# Patient Record
Sex: Female | Born: 1950 | Race: Black or African American | Hispanic: No | Marital: Married | Smoking: Never smoker
Health system: Southern US, Community
[De-identification: ages and names within clinical notes are randomized; demographics above are authoritative.]

## PROBLEM LIST (undated history)

## (undated) DIAGNOSIS — I5042 Chronic combined systolic (congestive) and diastolic (congestive) heart failure: Secondary | ICD-10-CM

## (undated) DIAGNOSIS — I10 Essential (primary) hypertension: Secondary | ICD-10-CM

## (undated) DIAGNOSIS — R7989 Other specified abnormal findings of blood chemistry: Secondary | ICD-10-CM

## (undated) DIAGNOSIS — E119 Type 2 diabetes mellitus without complications: Secondary | ICD-10-CM

## (undated) DIAGNOSIS — R778 Other specified abnormalities of plasma proteins: Secondary | ICD-10-CM

## (undated) DIAGNOSIS — L97909 Non-pressure chronic ulcer of unspecified part of unspecified lower leg with unspecified severity: Secondary | ICD-10-CM

## (undated) DIAGNOSIS — N186 End stage renal disease: Secondary | ICD-10-CM

## (undated) DIAGNOSIS — K589 Irritable bowel syndrome without diarrhea: Secondary | ICD-10-CM

## (undated) DIAGNOSIS — D649 Anemia, unspecified: Secondary | ICD-10-CM

## (undated) HISTORY — PX: HIP CLOSED REDUCTION: SHX983

## (undated) HISTORY — PX: CHOLECYSTECTOMY: SHX55

## (undated) HISTORY — DX: Other specified abnormalities of plasma proteins: R77.8

## (undated) HISTORY — DX: End stage renal disease: N18.6

## (undated) HISTORY — PX: CATARACT EXTRACTION, BILATERAL: SHX1313

## (undated) HISTORY — DX: Chronic combined systolic (congestive) and diastolic (congestive) heart failure: I50.42

## (undated) HISTORY — DX: Other specified abnormal findings of blood chemistry: R79.89

## (undated) HISTORY — DX: Anemia, unspecified: D64.9

---

## 1998-05-07 ENCOUNTER — Ambulatory Visit (HOSPITAL_COMMUNITY): Admission: RE | Admit: 1998-05-07 | Discharge: 1998-05-07 | Payer: Self-pay

## 1998-06-25 ENCOUNTER — Emergency Department (HOSPITAL_COMMUNITY): Admission: EM | Admit: 1998-06-25 | Discharge: 1998-06-25 | Payer: Self-pay | Admitting: Emergency Medicine

## 2012-09-17 ENCOUNTER — Other Ambulatory Visit (HOSPITAL_COMMUNITY): Payer: Self-pay | Admitting: Family Medicine

## 2012-09-17 DIAGNOSIS — Z1231 Encounter for screening mammogram for malignant neoplasm of breast: Secondary | ICD-10-CM

## 2012-09-26 ENCOUNTER — Ambulatory Visit (HOSPITAL_COMMUNITY)
Admission: RE | Admit: 2012-09-26 | Discharge: 2012-09-26 | Disposition: A | Discharge status: Home / Self Care | Payer: Self-pay | Source: Ambulatory Visit | Attending: Family Medicine | Admitting: Family Medicine

## 2012-09-26 DIAGNOSIS — Z1231 Encounter for screening mammogram for malignant neoplasm of breast: Secondary | ICD-10-CM | POA: Insufficient documentation

## 2012-09-30 ENCOUNTER — Other Ambulatory Visit: Payer: Self-pay | Admitting: Family Medicine

## 2012-09-30 DIAGNOSIS — R928 Other abnormal and inconclusive findings on diagnostic imaging of breast: Secondary | ICD-10-CM

## 2012-10-08 ENCOUNTER — Telehealth (HOSPITAL_COMMUNITY): Payer: Self-pay | Admitting: *Deleted

## 2012-10-08 NOTE — Telephone Encounter (Signed)
Telephoned patient at home # and left message to return call to [facility] 

## 2012-10-10 ENCOUNTER — Other Ambulatory Visit: Payer: Self-pay | Admitting: *Deleted

## 2012-10-15 ENCOUNTER — Ambulatory Visit (HOSPITAL_COMMUNITY): Payer: Self-pay

## 2013-02-26 ENCOUNTER — Ambulatory Visit
Admission: RE | Admit: 2013-02-26 | Discharge: 2013-02-26 | Disposition: A | Discharge status: Home / Self Care | Payer: PRIVATE HEALTH INSURANCE | Source: Ambulatory Visit | Attending: Family Medicine | Admitting: Family Medicine

## 2013-02-26 DIAGNOSIS — R928 Other abnormal and inconclusive findings on diagnostic imaging of breast: Secondary | ICD-10-CM

## 2014-07-31 ENCOUNTER — Other Ambulatory Visit: Payer: Self-pay | Admitting: Internal Medicine

## 2014-07-31 DIAGNOSIS — R1012 Left upper quadrant pain: Secondary | ICD-10-CM

## 2014-08-06 ENCOUNTER — Ambulatory Visit
Admission: RE | Admit: 2014-08-06 | Discharge: 2014-08-06 | Disposition: A | Discharge status: Home / Self Care | Payer: Medicare Other | Source: Ambulatory Visit | Attending: Internal Medicine | Admitting: Internal Medicine

## 2014-08-06 ENCOUNTER — Encounter (INDEPENDENT_AMBULATORY_CARE_PROVIDER_SITE_OTHER): Payer: Self-pay

## 2014-08-06 ENCOUNTER — Other Ambulatory Visit: Payer: Self-pay | Admitting: Internal Medicine

## 2014-08-06 DIAGNOSIS — R1012 Left upper quadrant pain: Secondary | ICD-10-CM

## 2014-08-06 DIAGNOSIS — K829 Disease of gallbladder, unspecified: Secondary | ICD-10-CM

## 2014-08-13 ENCOUNTER — Other Ambulatory Visit: Payer: Medicare Other

## 2014-08-18 ENCOUNTER — Ambulatory Visit
Admission: RE | Admit: 2014-08-18 | Discharge: 2014-08-18 | Disposition: A | Discharge status: Home / Self Care | Payer: Medicare Other | Source: Ambulatory Visit | Attending: Internal Medicine | Admitting: Internal Medicine

## 2015-06-21 ENCOUNTER — Emergency Department (HOSPITAL_COMMUNITY)
Admission: EM | Admit: 2015-06-21 | Discharge: 2015-06-21 | Disposition: A | Discharge status: Home / Self Care | Payer: Medicare Other | Attending: Emergency Medicine | Admitting: Emergency Medicine

## 2015-06-21 ENCOUNTER — Emergency Department (HOSPITAL_COMMUNITY): Payer: Medicare Other

## 2015-06-21 ENCOUNTER — Encounter (HOSPITAL_COMMUNITY): Payer: Self-pay

## 2015-06-21 DIAGNOSIS — K8020 Calculus of gallbladder without cholecystitis without obstruction: Secondary | ICD-10-CM | POA: Diagnosis not present

## 2015-06-21 DIAGNOSIS — R630 Anorexia: Secondary | ICD-10-CM | POA: Diagnosis not present

## 2015-06-21 DIAGNOSIS — R197 Diarrhea, unspecified: Secondary | ICD-10-CM | POA: Diagnosis not present

## 2015-06-21 DIAGNOSIS — Z794 Long term (current) use of insulin: Secondary | ICD-10-CM | POA: Diagnosis not present

## 2015-06-21 DIAGNOSIS — R1011 Right upper quadrant pain: Secondary | ICD-10-CM | POA: Diagnosis present

## 2015-06-21 DIAGNOSIS — R109 Unspecified abdominal pain: Secondary | ICD-10-CM

## 2015-06-21 DIAGNOSIS — E119 Type 2 diabetes mellitus without complications: Secondary | ICD-10-CM | POA: Insufficient documentation

## 2015-06-21 DIAGNOSIS — I10 Essential (primary) hypertension: Secondary | ICD-10-CM | POA: Diagnosis not present

## 2015-06-21 DIAGNOSIS — Z79899 Other long term (current) drug therapy: Secondary | ICD-10-CM | POA: Diagnosis not present

## 2015-06-21 HISTORY — DX: Type 2 diabetes mellitus without complications: E11.9

## 2015-06-21 HISTORY — DX: Essential (primary) hypertension: I10

## 2015-06-21 LAB — COMPREHENSIVE METABOLIC PANEL
ALK PHOS: 93 U/L (ref 38–126)
ALT: 19 U/L (ref 14–54)
AST: 36 U/L (ref 15–41)
Albumin: 4.1 g/dL (ref 3.5–5.0)
Anion gap: 17 — ABNORMAL HIGH (ref 5–15)
BILIRUBIN TOTAL: 0.9 mg/dL (ref 0.3–1.2)
BUN: 21 mg/dL — AB (ref 6–20)
CALCIUM: 10.2 mg/dL (ref 8.9–10.3)
CO2: 27 mmol/L (ref 22–32)
CREATININE: 1.26 mg/dL — AB (ref 0.44–1.00)
Chloride: 95 mmol/L — ABNORMAL LOW (ref 101–111)
GFR calc Af Amer: 52 mL/min — ABNORMAL LOW (ref >60)
GFR calc non Af Amer: 45 mL/min — ABNORMAL LOW (ref >60)
GLUCOSE: 392 mg/dL — AB (ref 65–99)
POTASSIUM: 3.7 mmol/L (ref 3.5–5.1)
Sodium: 139 mmol/L (ref 135–145)
TOTAL PROTEIN: 8.9 g/dL — AB (ref 6.5–8.1)

## 2015-06-21 LAB — LIPASE, BLOOD: LIPASE: 25 U/L (ref 11–51)

## 2015-06-21 LAB — CBC
HEMATOCRIT: 43.6 % (ref 36.0–46.0)
Hemoglobin: 14.4 g/dL (ref 12.0–15.0)
MCH: 26.9 pg (ref 26.0–34.0)
MCHC: 33 g/dL (ref 30.0–36.0)
MCV: 81.5 fL (ref 78.0–100.0)
Platelets: 309 10*3/uL (ref 150–400)
RBC: 5.35 MIL/uL — ABNORMAL HIGH (ref 3.87–5.11)
RDW: 13.5 % (ref 11.5–15.5)
WBC: 14.3 10*3/uL — ABNORMAL HIGH (ref 4.0–10.5)

## 2015-06-21 MED ORDER — ONDANSETRON HCL 4 MG PO TABS: 4.0000 mg | ORAL_TABLET | Freq: Three times a day (TID) | ORAL | Status: DC | PRN

## 2015-06-21 MED ORDER — FENTANYL CITRATE (PF) 100 MCG/2ML IJ SOLN
50.0000 ug | Freq: Once | INTRAMUSCULAR | Status: AC
  Administered 2015-06-21: 50 ug via INTRAVENOUS
  Filled 2015-06-21: qty 2

## 2015-06-21 MED ORDER — SODIUM CHLORIDE 0.9 % IV SOLN
Freq: Once | INTRAVENOUS | Status: AC
  Administered 2015-06-21: 75 mL/h via INTRAVENOUS

## 2015-06-21 MED ORDER — IOHEXOL 300 MG/ML  SOLN
80.0000 mL | Freq: Once | INTRAMUSCULAR | Status: AC | PRN
  Administered 2015-06-21: 80 mL via INTRAVENOUS

## 2015-06-21 MED ORDER — ONDANSETRON HCL 4 MG/2ML IJ SOLN
4.0000 mg | Freq: Once | INTRAMUSCULAR | Status: AC
  Administered 2015-06-21: 4 mg via INTRAVENOUS
  Filled 2015-06-21: qty 2

## 2015-06-21 MED ORDER — HYDROCHLOROTHIAZIDE 12.5 MG PO CAPS
12.5000 mg | ORAL_CAPSULE | Freq: Once | ORAL | Status: AC
  Administered 2015-06-21: 12.5 mg via ORAL
  Filled 2015-06-21: qty 1

## 2015-06-21 MED ORDER — FENTANYL CITRATE (PF) 100 MCG/2ML IJ SOLN
100.0000 ug | Freq: Once | INTRAMUSCULAR | Status: AC
  Administered 2015-06-21: 100 ug via INTRAVENOUS
  Filled 2015-06-21: qty 2

## 2015-06-21 MED ORDER — LABETALOL HCL 5 MG/ML IV SOLN
10.0000 mg | Freq: Once | INTRAVENOUS | Status: DC
  Filled 2015-06-21: qty 4

## 2015-06-21 MED ORDER — LOSARTAN POTASSIUM 50 MG PO TABS
100.0000 mg | ORAL_TABLET | Freq: Once | ORAL | Status: AC
  Administered 2015-06-21: 100 mg via ORAL
  Filled 2015-06-21: qty 2

## 2015-06-21 MED ORDER — ONDANSETRON HCL 4 MG/2ML IJ SOLN
2.0000 mg | Freq: Once | INTRAMUSCULAR | Status: AC
  Administered 2015-06-21: 2 mg via INTRAVENOUS
  Filled 2015-06-21: qty 2

## 2015-06-21 NOTE — ED Notes (Signed)
EKG was performed in triage by Philippa Chester, Phlebotomy at 1012 and signed by Dayna Barker, MD at [phone]

## 2015-06-21 NOTE — ED Notes (Signed)
Patient here with vomiting and abdominal pain since last pm, denies diarrhea. Complains that her pain is a cramping pain

## 2015-06-21 NOTE — ED Notes (Signed)
Doctor notified of patient's blood pressure. Patient did not take blood pressure medication for two days due abdominal pain with nausea and emesis.  Doctor will assess response to blood pressure after pain medication administration.

## 2015-06-21 NOTE — ED Notes (Signed)
Ordered not to give labetalol to patient by Doctor.

## 2015-06-21 NOTE — ED Provider Notes (Signed)
CSN: [id number]     Arrival date & time 06/21/15  0949 History   First MD Initiated Contact with Patient 06/21/15 1022     Chief Complaint  Patient presents with  . Abdominal Pain  . Emesis     (Consider location/radiation/quality/duration/timing/severity/associated sxs/prior Treatment) HPI  Molly [name] is a 64 year old woman with PMH of T2DM (Levemir 32U nightly, glymepriride) and HTN (losartan) who presents with acute onset abdominal pain. This came on after a meal last night at [employer], comprised of onion rings and a milk shake. She describes it as a cramping pain in the right upper and upper abdomen. It is associated with several episodes of non-bloody, non-bilious emesis, consisting of partially digested food. She's had some non-bloody, non-melenic loose stools. She indicates she has never felt symptoms like this before. She says that a year or so ago she episodes of indigestion and vomiting, which is treated with metoclopramide. She has never had a gastric emptying study. She denies fever or chills  Past Medical History  Diagnosis Date  . Hypertension   . Diabetes mellitus without complication (HCC)    History reviewed. No pertinent past surgical history. No family history on file. Social History  Substance Use Topics  . Smoking status: Never Smoker   . Smokeless tobacco: None  . Alcohol Use: None   OB History    No data available     Review of Systems  Constitutional: Positive for appetite change. Negative for fever and chills.  HENT: Negative for congestion and sore throat.   Eyes: Negative for photophobia and visual disturbance.  Respiratory: Negative for cough and shortness of breath.   Cardiovascular: Negative for chest pain and leg swelling.  Gastrointestinal: Positive for nausea, vomiting, abdominal pain and diarrhea. Negative for constipation and blood in stool.  Genitourinary: Negative for dysuria and pelvic pain.  Musculoskeletal: Negative for back pain and  arthralgias.  Skin: Negative for pallor and rash.  Neurological: Negative for dizziness and headaches.  Psychiatric/Behavioral: Negative for dysphoric mood. The patient is not nervous/anxious.   All other systems reviewed and are negative.     Allergies  Mercury  Home Medications   Prior to Admission medications   Medication Sig Start Date End Date Taking? Authorizing Provider  glimepiride (AMARYL) 4 MG tablet Take 4 mg by mouth daily with breakfast.   Yes Historical Provider, MD  Insulin Detemir (LEVEMIR FLEXPEN) 100 UNIT/ML Pen Inject 32 Units into the skin daily at 10 pm.   Yes Historical Provider, MD  Lactobacillus (PROBIOTIC ACIDOPHILUS PO) Take by mouth daily.   Yes Historical Provider, MD  losartan-hydrochlorothiazide (HYZAAR) 100-12.5 MG tablet Take 1 tablet by mouth daily.   Yes Historical Provider, MD  metoCLOPramide (REGLAN) 5 MG tablet Take 5 mg by mouth 2 (two) times daily.   Yes Historical Provider, MD  ondansetron (ZOFRAN) 4 MG tablet Take 1 tablet (4 mg total) by mouth every 8 (eight) hours as needed for nausea or vomiting. 06/21/15   Jeremy Ford, MD   BP 238/116 mmHg  Pulse 93  Temp(Src) 97.7 F (36.5 C) (Oral)  Resp 20  Ht 5' 2" (1.575 m)  Wt 70.308 kg  BMI 28.34 kg/m2  SpO2 98% Physical Exam  Constitutional: She appears well-developed and well-nourished.  Appearing uncomfortable.  HENT:  Head: Normocephalic and atraumatic.  Mouth/Throat: No oropharyngeal exudate.  Dry mucous membranes.  Eyes: EOM are normal. Pupils are equal, round, and reactive to light. No scleral icterus.  Neck: Normal range   of motion. Neck supple.  Cardiovascular: Normal rate, regular rhythm and normal heart sounds.   Pulmonary/Chest: Effort normal and breath sounds normal. No respiratory distress. She has no wheezes.  Abdominal:  Hypoactive bowel sounds. Exquisite tenderness resting stethoscope in epigastric region. + Murphy's sign. Cannot tolerate deep palpation. No palpable masses.  No rebound.   Musculoskeletal: She exhibits no edema or tenderness.  Lymphadenopathy:    She has no cervical adenopathy.  Skin: She is not diaphoretic.  Nursing note and vitals reviewed.   ED Course  Procedures (including critical care time) Labs Review Labs Reviewed  COMPREHENSIVE METABOLIC PANEL - Abnormal; Notable for the following:    Chloride 95 (*)    Glucose, Bld 392 (*)    BUN 21 (*)    Creatinine, Ser 1.26 (*)    Total Protein 8.9 (*)    GFR calc non Af Amer 45 (*)    GFR calc Af Amer 52 (*)    Anion gap 17 (*)    All other components within normal limits  CBC - Abnormal; Notable for the following:    WBC 14.3 (*)    RBC 5.35 (*)    All other components within normal limits  LIPASE, BLOOD  URINALYSIS, ROUTINE W REFLEX MICROSCOPIC (NOT AT [facility])   CMP Latest Ref Rng 06/21/2015  Glucose 65 - 99 mg/dL 392(H)  BUN 6 - 20 mg/dL 21(H)  Creatinine 0.44 - 1.00 mg/dL 1.26(H)  Sodium 135 - 145 mmol/L 139  Potassium 3.5 - 5.1 mmol/L 3.7  Chloride 101 - 111 mmol/L 95(L)  CO2 22 - 32 mmol/L 27  Calcium 8.9 - 10.3 mg/dL 10.2  Total Protein 6.5 - 8.1 g/dL 8.9(H)  Total Bilirubin 0.3 - 1.2 mg/dL 0.9  Alkaline Phos 38 - 126 U/L 93  AST 15 - 41 U/L 36  ALT 14 - 54 U/L 19    CBC Latest Ref Rng 06/21/2015  WBC 4.0 - 10.5 K/uL 14.3(H)  Hemoglobin 12.0 - 15.0 g/dL 14.4  Hematocrit 36.0 - 46.0 % 43.6  Platelets 150 - 400 K/uL 309      Lipase     Component Value Date/Time   LIPASE 25 06/21/2015 1020   Filed Vitals:   06/21/15 1330 06/21/15 1345 06/21/15 1400 06/21/15 1504  BP: 244/113 246/115 261/109 238/116  Pulse: 80 84 81 93  Temp:      TempSrc:      Resp: _0 Height:      Weight:      SpO2: 97% 94% 96% 98%   Imaging Review Ct Abdomen Pelvis W Contrast  06/21/2015  CLINICAL DATA:  65 year old female with epigastric pain common nausea and vomiting since yesterday evening. Loose stools. EXAM: CT ABDOMEN AND PELVIS WITH CONTRAST TECHNIQUE: Multidetector CT  imaging of the abdomen and pelvis was performed using the standard protocol following bolus administration of intravenous contrast. CONTRAST:  63m OMNIPAQUE IOHEXOL 300 MG/ML  SOLN COMPARISON:  No priors. FINDINGS: Lower chest: Mild scarring in the lung bases bilaterally. Atherosclerotic calcifications in the left circumflex and right coronary arteries. Mild cardiomegaly. Hepatobiliary: No cystic or solid hepatic lesions. No intra or extrahepatic biliary ductal dilatation. Gallbladder is normal in appearance. Pancreas: No pancreatic mass. No pancreatic ductal dilatation. No pancreatic or peripancreatic fluid or inflammatory changes. Spleen: Unremarkable. Adrenals/Urinary Tract: Bilateral adrenal glands and left kidney are normal in appearance. Sub cm low-attenuation lesion in the upper pole of the right kidney is too small to definitively characterize, but is  likely a cyst. No hydroureteronephrosis. Urinary bladder is normal in appearance. Bilateral adrenal glands are normal in appearance. Stomach/Bowel: Normal appearance of the stomach. No pathologic dilatation of small bowel or colon. Colon is essentially completely decompressed. Normal appendix. Vascular/Lymphatic: Extensive atherosclerosis throughout the abdominal and pelvic vasculature, including an area of moderate to severe stenosis immediately beyond the right renal artery origin and before the left renal artery origin (best demonstrated on images 32 and 33 of series 201). Celiac axis, superior mesenteric artery and inferior mesenteric artery appear patent at this time. No lymphadenopathy noted in the abdomen or pelvis. Reproductive: Uterus and ovaries are unremarkable in appearance. Other: No significant volume of ascites.  No pneumoperitoneum. Musculoskeletal: There are no aggressive appearing lytic or blastic lesions noted in the visualized portions of the skeleton. IMPRESSION: 1. No acute findings in the abdomen or pelvis to account for the patient's  symptoms. 2. 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 3. Normal appendix. 4. Additional incidental findings, as above. Electronically Signed   By: Daniel  Entrikin M.D.   On: 06/21/2015 15:10   Us Abdomen Limited Ruq  06/21/2015  CLINICAL DATA:  Patient with right upper quadrant abdominal pain since last night. EXAM: US ABDOMEN LIMITED - RIGHT UPPER QUADRANT COMPARISON:  Abdominal ultrasound 08/18/2014 FINDINGS: Gallbladder: Multiple mobile stones are demonstrated within the gallbladder lumen. No gallbladder wall thickening. No sonographic Murphy's sign. No pericholecystic fluid. Common bile duct: Diameter: 5 mm Liver: No focal lesion identified. Within normal limits in parenchymal echogenicity. IMPRESSION: Cholelithiasis without sonographic evidence for acute cholecystitis. Electronically Signed   By: Drew  Davis M.D.   On: 06/21/2015 12:25   I have personally reviewed and evaluated these images and lab results as part of my medical decision-making.   EKG Interpretation   Date/Time:  Monday June 21 2015 10:12:32 EST Ventricular Rate:  83 PR Interval:  184 QRS Duration: 86 QT Interval:  416 QTC Calculation: 488 R Axis:   13 Text Interpretation:  Normal sinus rhythm Anterior infarct , age  undetermined Abnormal ECG No previous ECGs available Confirmed by MESNER  MD, JASON ([id number]) on 06/21/2015 10:21:04 AM      MDM   Final diagnoses:  Abdominal pain  Calculus of gallbladder without cholecystitis without obstruction   Patient presents with exquisite RUQ & epigastric abdominal pain with BP to 257/106, WBC 14.7. No fever. Patient has known cholelithiasis from 08/2014 US. Will obtain lipase, RUQ US, and CMET. Has not taken losartan in a couple days, although much of HTN could be driven by pain. Will give IV fentanyl and see to what degree BP  reponds.  Normal lipase, alk phos, LFTs normal. RUQ US demonstrates cholelithiasis, similar to prior, without cholecystitis. CT abdomen showed no acute findings correlated with current symptoms. Patient had elevated blood pressure, likely due to skipping blood pressure meds, and came down by 20 points by re-initiating her home meds. She was advised to reinitiate these at home as soon as possible and follow up with her PCP. She denied any vision changes or headache. She denied any chest pain, shortness of breath. Her Cr was elevated to 1.24, likely due to poor po intake.   She was able to tolerate apple sauce. Instructions were given for follow-up. She was told she could pursue elective cholecystectomy on an outpatient basis. She was discharged in good condition.        Jeremy Ford, MD 06/21/15 1640  Jason Mesner, MD 06/23/15 1735 

## 2015-06-21 NOTE — Discharge Instructions (Signed)
Molly [name], it was a pleasure taking care of you in the hospital.  You have stones in your gallbladder, but there is no sign of a gallbladder infection. There is no sign of inflammation in your pancreas. You do not need an emergency surgery.  We will prescribe anti-nausea pills. If you should develop a fever your symptoms are not improved in a day or two, please seek medical attention.   Please follow up with your primary doctor as soon as possible. Something to consider would be an elective surgery for your gallbladder.   When you are able to, please restart your blood pressure medication, as your blood pressure was high in the emergency department.

## 2015-06-21 NOTE — ED Notes (Signed)
Doctor notified of blood pressure and at bedside.

## 2015-06-21 NOTE — ED Notes (Signed)
Doctor at bedside.

## 2015-06-29 ENCOUNTER — Encounter (HOSPITAL_COMMUNITY): Payer: Self-pay

## 2015-06-29 ENCOUNTER — Inpatient Hospital Stay (HOSPITAL_COMMUNITY)
Admission: EM | Admit: 2015-06-29 | Discharge: 2015-07-06 | DRG: 417 | Disposition: A | Discharge status: Home Health | Payer: Medicare Other | Attending: Family Medicine | Admitting: Family Medicine

## 2015-06-29 DIAGNOSIS — E11649 Type 2 diabetes mellitus with hypoglycemia without coma: Secondary | ICD-10-CM | POA: Diagnosis not present

## 2015-06-29 DIAGNOSIS — Z91128 Patient's intentional underdosing of medication regimen for other reason: Secondary | ICD-10-CM | POA: Diagnosis not present

## 2015-06-29 DIAGNOSIS — E876 Hypokalemia: Secondary | ICD-10-CM | POA: Diagnosis not present

## 2015-06-29 DIAGNOSIS — Z419 Encounter for procedure for purposes other than remedying health state, unspecified: Secondary | ICD-10-CM

## 2015-06-29 DIAGNOSIS — Z794 Long term (current) use of insulin: Secondary | ICD-10-CM | POA: Diagnosis not present

## 2015-06-29 DIAGNOSIS — Z91048 Other nonmedicinal substance allergy status: Secondary | ICD-10-CM | POA: Diagnosis not present

## 2015-06-29 DIAGNOSIS — I700 Atherosclerosis of aorta: Secondary | ICD-10-CM

## 2015-06-29 DIAGNOSIS — K8020 Calculus of gallbladder without cholecystitis without obstruction: Secondary | ICD-10-CM

## 2015-06-29 DIAGNOSIS — N179 Acute kidney failure, unspecified: Secondary | ICD-10-CM | POA: Diagnosis not present

## 2015-06-29 DIAGNOSIS — D72829 Elevated white blood cell count, unspecified: Secondary | ICD-10-CM

## 2015-06-29 DIAGNOSIS — E162 Hypoglycemia, unspecified: Secondary | ICD-10-CM | POA: Diagnosis not present

## 2015-06-29 DIAGNOSIS — Z79899 Other long term (current) drug therapy: Secondary | ICD-10-CM

## 2015-06-29 DIAGNOSIS — K819 Cholecystitis, unspecified: Secondary | ICD-10-CM

## 2015-06-29 DIAGNOSIS — R739 Hyperglycemia, unspecified: Secondary | ICD-10-CM

## 2015-06-29 DIAGNOSIS — E1310 Other specified diabetes mellitus with ketoacidosis without coma: Secondary | ICD-10-CM | POA: Diagnosis not present

## 2015-06-29 DIAGNOSIS — D62 Acute posthemorrhagic anemia: Secondary | ICD-10-CM | POA: Diagnosis not present

## 2015-06-29 DIAGNOSIS — E1311 Other specified diabetes mellitus with ketoacidosis with coma: Secondary | ICD-10-CM | POA: Diagnosis not present

## 2015-06-29 DIAGNOSIS — E860 Dehydration: Secondary | ICD-10-CM | POA: Diagnosis present

## 2015-06-29 DIAGNOSIS — K8000 Calculus of gallbladder with acute cholecystitis without obstruction: Principal | ICD-10-CM | POA: Diagnosis present

## 2015-06-29 DIAGNOSIS — T383X6A Underdosing of insulin and oral hypoglycemic [antidiabetic] drugs, initial encounter: Secondary | ICD-10-CM | POA: Diagnosis present

## 2015-06-29 DIAGNOSIS — E871 Hypo-osmolality and hyponatremia: Secondary | ICD-10-CM | POA: Diagnosis not present

## 2015-06-29 DIAGNOSIS — E0810 Diabetes mellitus due to underlying condition with ketoacidosis without coma: Secondary | ICD-10-CM

## 2015-06-29 DIAGNOSIS — E1110 Type 2 diabetes mellitus with ketoacidosis without coma: Secondary | ICD-10-CM | POA: Diagnosis present

## 2015-06-29 DIAGNOSIS — R748 Abnormal levels of other serum enzymes: Secondary | ICD-10-CM | POA: Diagnosis present

## 2015-06-29 DIAGNOSIS — I10 Essential (primary) hypertension: Secondary | ICD-10-CM | POA: Diagnosis not present

## 2015-06-29 DIAGNOSIS — J189 Pneumonia, unspecified organism: Secondary | ICD-10-CM | POA: Diagnosis not present

## 2015-06-29 DIAGNOSIS — R109 Unspecified abdominal pain: Secondary | ICD-10-CM | POA: Diagnosis present

## 2015-06-29 HISTORY — DX: Essential (primary) hypertension: I10

## 2015-06-29 HISTORY — DX: Atherosclerosis of aorta: I70.0

## 2015-06-29 HISTORY — DX: Cholecystitis, unspecified: K81.9

## 2015-06-29 HISTORY — DX: Acute kidney failure, unspecified: N17.9

## 2015-06-29 HISTORY — DX: Calculus of gallbladder without cholecystitis without obstruction: K80.20

## 2015-06-29 HISTORY — DX: Dehydration: E86.0

## 2015-06-29 HISTORY — DX: Type 2 diabetes mellitus with ketoacidosis without coma: E11.10

## 2015-06-29 HISTORY — DX: Elevated white blood cell count, unspecified: D72.829

## 2015-06-29 LAB — PHOSPHORUS
Phosphorus: 1.7 mg/dL — ABNORMAL LOW (ref 2.5–4.6)
Phosphorus: 1.1 mg/dL — ABNORMAL LOW (ref 2.5–4.6)

## 2015-06-29 LAB — COMPREHENSIVE METABOLIC PANEL
ALT: 25 U/L (ref 14–54)
ANION GAP: 17 — AB (ref 5–15)
AST: 29 U/L (ref 15–41)
Albumin: 2.9 g/dL — ABNORMAL LOW (ref 3.5–5.0)
Alkaline Phosphatase: 134 U/L — ABNORMAL HIGH (ref 38–126)
BUN: 47 mg/dL — AB (ref 6–20)
CALCIUM: 9 mg/dL (ref 8.9–10.3)
CHLORIDE: 85 mmol/L — AB (ref 101–111)
CO2: 28 mmol/L (ref 22–32)
Creatinine, Ser: 1.92 mg/dL — ABNORMAL HIGH (ref 0.44–1.00)
GFR, EST AFRICAN AMERICAN: 31 mL/min — AB (ref >60)
GFR, EST NON AFRICAN AMERICAN: 27 mL/min — AB (ref >60)
GLUCOSE: 825 mg/dL — AB (ref 65–99)
POTASSIUM: 4.2 mmol/L (ref 3.5–5.1)
Sodium: 130 mmol/L — ABNORMAL LOW (ref 135–145)
TOTAL PROTEIN: 7.3 g/dL (ref 6.5–8.1)
Total Bilirubin: 0.7 mg/dL (ref 0.3–1.2)

## 2015-06-29 LAB — BASIC METABOLIC PANEL
Anion gap: 14 (ref 5–15)
Anion gap: 11 (ref 5–15)
Anion gap: 10 (ref 5–15)
BUN: 44 mg/dL — AB (ref 6–20)
BUN: 42 mg/dL — AB (ref 6–20)
BUN: 39 mg/dL — AB (ref 6–20)
CHLORIDE: 93 mmol/L — AB (ref 101–111)
CHLORIDE: 97 mmol/L — AB (ref 101–111)
CHLORIDE: 98 mmol/L — AB (ref 101–111)
CO2: 28 mmol/L (ref 22–32)
CO2: 27 mmol/L (ref 22–32)
CO2: 30 mmol/L (ref 22–32)
CREATININE: 1.76 mg/dL — AB (ref 0.44–1.00)
CREATININE: 1.7 mg/dL — AB (ref 0.44–1.00)
CREATININE: 1.6 mg/dL — AB (ref 0.44–1.00)
Calcium: 8.4 mg/dL — ABNORMAL LOW (ref 8.9–10.3)
Calcium: 8.3 mg/dL — ABNORMAL LOW (ref 8.9–10.3)
Calcium: 8.5 mg/dL — ABNORMAL LOW (ref 8.9–10.3)
GFR calc Af Amer: 35 mL/min — ABNORMAL LOW (ref >60)
GFR calc Af Amer: 36 mL/min — ABNORMAL LOW (ref >60)
GFR calc Af Amer: 39 mL/min — ABNORMAL LOW (ref >60)
GFR calc non Af Amer: 31 mL/min — ABNORMAL LOW (ref >60)
GFR calc non Af Amer: 33 mL/min — ABNORMAL LOW (ref >60)
GFR, EST NON AFRICAN AMERICAN: 30 mL/min — AB (ref >60)
GLUCOSE: 538 mg/dL — AB (ref 65–99)
GLUCOSE: 433 mg/dL — AB (ref 65–99)
GLUCOSE: 129 mg/dL — AB (ref 65–99)
POTASSIUM: 3.5 mmol/L (ref 3.5–5.1)
POTASSIUM: 3.4 mmol/L — AB (ref 3.5–5.1)
Potassium: 3.4 mmol/L — ABNORMAL LOW (ref 3.5–5.1)
SODIUM: 135 mmol/L (ref 135–145)
SODIUM: 135 mmol/L (ref 135–145)
SODIUM: 138 mmol/L (ref 135–145)

## 2015-06-29 LAB — CBG MONITORING, ED
GLUCOSE-CAPILLARY: 442 mg/dL — AB (ref 65–99)
GLUCOSE-CAPILLARY: 419 mg/dL — AB (ref 65–99)
GLUCOSE-CAPILLARY: 194 mg/dL — AB (ref 65–99)
Glucose-Capillary: >600 mg/dL (ref 65–99)
Glucose-Capillary: 477 mg/dL — ABNORMAL HIGH (ref 65–99)
Glucose-Capillary: 259 mg/dL — ABNORMAL HIGH (ref 65–99)
Glucose-Capillary: 138 mg/dL — ABNORMAL HIGH (ref 65–99)
Glucose-Capillary: 117 mg/dL — ABNORMAL HIGH (ref 65–99)
Glucose-Capillary: 134 mg/dL — ABNORMAL HIGH (ref 65–99)
Glucose-Capillary: 163 mg/dL — ABNORMAL HIGH (ref 65–99)

## 2015-06-29 LAB — CBC
HEMATOCRIT: 45 % (ref 36.0–46.0)
Hemoglobin: 14.4 g/dL (ref 12.0–15.0)
MCH: 26.8 pg (ref 26.0–34.0)
MCHC: 32 g/dL (ref 30.0–36.0)
MCV: 83.6 fL (ref 78.0–100.0)
Platelets: 504 10*3/uL — ABNORMAL HIGH (ref 150–400)
RBC: 5.38 MIL/uL — ABNORMAL HIGH (ref 3.87–5.11)
RDW: 13.3 % (ref 11.5–15.5)
WBC: 13.1 10*3/uL — ABNORMAL HIGH (ref 4.0–10.5)

## 2015-06-29 LAB — GLUCOSE, CAPILLARY: Glucose-Capillary: 145 mg/dL — ABNORMAL HIGH (ref 65–99)

## 2015-06-29 LAB — URINALYSIS, ROUTINE W REFLEX MICROSCOPIC
BILIRUBIN URINE: NEGATIVE
Glucose, UA: >1000 mg/dL — AB
HGB URINE DIPSTICK: NEGATIVE
KETONES UR: NEGATIVE mg/dL
Leukocytes, UA: NEGATIVE
NITRITE: NEGATIVE
PH: 5.5 (ref 5.0–8.0)
Protein, ur: NEGATIVE mg/dL
Specific Gravity, Urine: 1.03 (ref 1.005–1.030)

## 2015-06-29 LAB — URINE MICROSCOPIC-ADD ON

## 2015-06-29 LAB — LIPASE, BLOOD: Lipase: 59 U/L — ABNORMAL HIGH (ref 11–51)

## 2015-06-29 LAB — MAGNESIUM: MAGNESIUM: 2.1 mg/dL (ref 1.7–2.4)

## 2015-06-29 MED ORDER — SODIUM CHLORIDE 0.9 % IV SOLN
INTRAVENOUS | Status: DC
  Administered 2015-06-29 – 2015-07-04 (×4): via INTRAVENOUS

## 2015-06-29 MED ORDER — DEXTROSE 5 % IV SOLN
30.0000 mmol | Freq: Once | INTRAVENOUS | Status: DC
  Filled 2015-06-29: qty 10

## 2015-06-29 MED ORDER — INSULIN DETEMIR 100 UNIT/ML ~~LOC~~ SOLN
32.0000 [IU] | Freq: Every day | SUBCUTANEOUS | Status: DC
  Administered 2015-06-30 – 2015-07-04 (×3): 32 [IU] via SUBCUTANEOUS
  Filled 2015-06-29 (×7): qty 0.32

## 2015-06-29 MED ORDER — SODIUM CHLORIDE 0.9 % IV SOLN
INTRAVENOUS | Status: DC
  Administered 2015-06-29: 4.2 [IU]/h via INTRAVENOUS
  Filled 2015-06-29: qty 2.5

## 2015-06-29 MED ORDER — INSULIN ASPART 100 UNIT/ML ~~LOC~~ SOLN: 0.0000 [IU] | Freq: Every day | SUBCUTANEOUS | Status: DC

## 2015-06-29 MED ORDER — POTASSIUM CHLORIDE 10 MEQ/100ML IV SOLN
10.0000 meq | INTRAVENOUS | Status: AC
  Administered 2015-06-29 (×2): 10 meq via INTRAVENOUS
  Filled 2015-06-29 (×2): qty 100

## 2015-06-29 MED ORDER — INSULIN ASPART 100 UNIT/ML ~~LOC~~ SOLN
0.0000 [IU] | Freq: Three times a day (TID) | SUBCUTANEOUS | Status: DC
  Administered 2015-07-01 (×2): 3 [IU] via SUBCUTANEOUS
  Administered 2015-07-02: 2 [IU] via SUBCUTANEOUS
  Administered 2015-07-02 (×2): 3 [IU] via SUBCUTANEOUS

## 2015-06-29 MED ORDER — SODIUM CHLORIDE 0.9 % IV BOLUS (SEPSIS)
1000.0000 mL | Freq: Once | INTRAVENOUS | Status: AC
  Administered 2015-06-29: 1000 mL via INTRAVENOUS

## 2015-06-29 MED ORDER — SODIUM CHLORIDE 0.9 % IV SOLN
INTRAVENOUS | Status: DC
  Filled 2015-06-29: qty 2.5

## 2015-06-29 MED ORDER — INSULIN ASPART 100 UNIT/ML ~~LOC~~ SOLN
8.0000 [IU] | Freq: Once | SUBCUTANEOUS | Status: AC
  Administered 2015-06-29: 8 [IU] via INTRAVENOUS
  Filled 2015-06-29: qty 1

## 2015-06-29 MED ORDER — ONDANSETRON HCL 4 MG/2ML IJ SOLN
4.0000 mg | Freq: Once | INTRAMUSCULAR | Status: AC
  Administered 2015-06-29: 4 mg via INTRAVENOUS
  Filled 2015-06-29: qty 2

## 2015-06-29 MED ORDER — DEXTROSE-NACL 5-0.45 % IV SOLN
INTRAVENOUS | Status: DC
  Administered 2015-06-29: 18:00:00 via INTRAVENOUS

## 2015-06-29 MED ORDER — MORPHINE SULFATE (PF) 2 MG/ML IV SOLN: 1.0000 mg | INTRAVENOUS | Status: DC | PRN

## 2015-06-29 MED ORDER — HEPARIN SODIUM (PORCINE) 5000 UNIT/ML IJ SOLN
5000.0000 [IU] | Freq: Three times a day (TID) | INTRAMUSCULAR | Status: DC
Start: 2015-06-29 — End: 2015-06-30
  Administered 2015-06-29 – 2015-06-30 (×2): 5000 [IU] via SUBCUTANEOUS
  Filled 2015-06-29 (×2): qty 1

## 2015-06-29 MED ORDER — ONDANSETRON HCL 4 MG/2ML IJ SOLN
4.0000 mg | Freq: Four times a day (QID) | INTRAMUSCULAR | Status: DC | PRN
  Administered 2015-06-29 – 2015-07-03 (×5): 4 mg via INTRAVENOUS
  Filled 2015-06-29 (×5): qty 2

## 2015-06-29 MED ORDER — DEXTROSE 5 % IV SOLN
2.0000 g | INTRAVENOUS | Status: DC
  Administered 2015-06-29 – 2015-07-02 (×4): 2 g via INTRAVENOUS
  Filled 2015-06-29 (×6): qty 2

## 2015-06-29 NOTE — Consult Note (Signed)
Reason for Consult: abdominal pain Referring Physician: Dr. Gareth Morgan   HPI: Molly [name] is a 65 year old female with a history of diabetes mellitus and hypertension who presents with abdominal pain.  Symptoms started 8 days ago after eating a milk shake and curly fries from [employer].  She was seen in the ED, had a abdominal US which revealed gallstones and a CT scan without acute findings and was subsequently discharged home. Her symptoms persisted, but waxed and waned in severity.  Associated with nausea and vomiting.  She has not been able to keep much PO down.  She has therefore not taken any diabetes medication.  Denies fever, chills or sweats.  Denies previous surgeries.  ED work up shows, K 3.4.  sCr 1.92 and now 1.76, WBC 13k, CBGs 825 and 528.   We have been asked to evaluate for symptomatic cholelithiasis.   Past Medical History  Diagnosis Date  . Hypertension   . Diabetes mellitus without complication ([place])     History reviewed. No pertinent past surgical history.  History reviewed. No pertinent family history.  Social History:  reports that she has never smoked. She does not have any smokeless tobacco history on file. She reports that she does not drink alcohol. Her drug history is not on file.  Allergies:  Allergies  Allergen Reactions  . Mercury Nausea And Vomiting    Medications:  Scheduled Meds: . heparin  5,000 Units Subcutaneous 3 times per day   Continuous Infusions: . sodium chloride 150 mL/hr at 06/29/15 1145  . dextrose 5 % and 0.45% NaCl Stopped (06/29/15 1302)  . insulin (NOVOLIN-R) infusion 7.6 Units/hr (06/29/15 1346)  . potassium chloride 10 mEq (06/29/15 1354)  . potassium phosphate IVPB (mmol)     PRN Meds:.morphine injection, ondansetron (ZOFRAN) IV   Results for orders placed or performed during the hospital encounter of 06/29/15 (from the past 48 hour(s))  Lipase, blood     Status: Abnormal   Collection Time: 06/29/15  7:58 AM  Result  Value Ref Range   Lipase 59 (H) 11 - 51 U/L  Comprehensive metabolic panel     Status: Abnormal   Collection Time: 06/29/15  7:58 AM  Result Value Ref Range   Sodium 130 (L) 135 - 145 mmol/L   Potassium 4.2 3.5 - 5.1 mmol/L   Chloride 85 (L) 101 - 111 mmol/L   CO2 28 22 - 32 mmol/L   Glucose, Bld 825 (HH) 65 - 99 mg/dL    Comment: CRITICAL RESULT CALLED TO, READ BACK BY AND VERIFIED WITH: J.BLUE,RN [id number] 06/29/15 CLARK,S    BUN 47 (H) 6 - 20 mg/dL   Creatinine, Ser 1.92 (H) 0.44 - 1.00 mg/dL   Calcium 9.0 8.9 - 10.3 mg/dL   Total Protein 7.3 6.5 - 8.1 g/dL   Albumin 2.9 (L) 3.5 - 5.0 g/dL   AST 29 15 - 41 U/L   ALT 25 14 - 54 U/L   Alkaline Phosphatase 134 (H) 38 - 126 U/L   Total Bilirubin 0.7 0.3 - 1.2 mg/dL   GFR calc non Af Amer 27 (L) >60 mL/min   GFR calc Af Amer 31 (L) >60 mL/min    Comment: (NOTE) The eGFR has been calculated using the CKD EPI equation. This calculation has not been validated in all clinical situations. eGFR's persistently <60 mL/min signify possible Chronic Kidney Disease.    Anion gap 17 (H) 5 - 15  CBC     Status: Abnormal  Collection Time: 06/29/15  7:58 AM  Result Value Ref Range   WBC 13.1 (H) 4.0 - 10.5 K/uL   RBC 5.38 (H) 3.87 - 5.11 MIL/uL   Hemoglobin 14.4 12.0 - 15.0 g/dL   HCT 45.0 36.0 - 46.0 %   MCV 83.6 78.0 - 100.0 fL   MCH 26.8 26.0 - 34.0 pg   MCHC 32.0 30.0 - 36.0 g/dL   RDW 13.3 11.5 - 15.5 %   Platelets 504 (H) 150 - 400 K/uL  CBG monitoring, ED     Status: Abnormal   Collection Time: 06/29/15 10:06 AM  Result Value Ref Range   Glucose-Capillary >600 (HH) 65 - 99 mg/dL  CBG monitoring, ED     Status: Abnormal   Collection Time: 06/29/15 10:53 AM  Result Value Ref Range   Glucose-Capillary >600 (HH) 65 - 99 mg/dL   Comment 1 Notify RN   Basic metabolic panel     Status: Abnormal   Collection Time: 06/29/15 12:21 PM  Result Value Ref Range   Sodium 135 135 - 145 mmol/L   Potassium 3.4 (L) 3.5 - 5.1 mmol/L   Chloride  93 (L) 101 - 111 mmol/L   CO2 28 22 - 32 mmol/L   Glucose, Bld 538 (H) 65 - 99 mg/dL   BUN 44 (H) 6 - 20 mg/dL   Creatinine, Ser 1.76 (H) 0.44 - 1.00 mg/dL   Calcium 8.4 (L) 8.9 - 10.3 mg/dL   GFR calc non Af Amer 30 (L) >60 mL/min   GFR calc Af Amer 35 (L) >60 mL/min    Comment: (NOTE) The eGFR has been calculated using the CKD EPI equation. This calculation has not been validated in all clinical situations. eGFR's persistently <60 mL/min signify possible Chronic Kidney Disease.    Anion gap 14 5 - 15  Magnesium     Status: None   Collection Time: 06/29/15 12:21 PM  Result Value Ref Range   Magnesium 2.1 1.7 - 2.4 mg/dL  Phosphorus     Status: Abnormal   Collection Time: 06/29/15 12:21 PM  Result Value Ref Range   Phosphorus 1.7 (L) 2.5 - 4.6 mg/dL  CBG monitoring, ED     Status: Abnormal   Collection Time: 06/29/15 12:22 PM  Result Value Ref Range   Glucose-Capillary 477 (H) 65 - 99 mg/dL  CBG monitoring, ED     Status: Abnormal   Collection Time: 06/29/15  1:45 PM  Result Value Ref Range   Glucose-Capillary 442 (H) 65 - 99 mg/dL    No results found.  Review of Systems  Constitutional: Negative for fever, chills, weight loss, malaise/fatigue and diaphoresis.  Eyes: Negative for blurred vision, double vision, photophobia, pain, discharge and redness.  Respiratory: Negative for cough, hemoptysis, sputum production, shortness of breath and wheezing.   Cardiovascular: Negative for chest pain, palpitations, orthopnea, claudication, leg swelling and PND.  Gastrointestinal: Positive for nausea, vomiting, abdominal pain and diarrhea. Negative for constipation, blood in stool and melena.  Genitourinary: Negative for dysuria, urgency, frequency and hematuria.  Neurological: Negative for dizziness, tingling, tremors, sensory change, speech change, focal weakness, seizures, loss of consciousness and weakness.  Psychiatric/Behavioral: Negative for depression, suicidal ideas and  substance abuse.   Blood pressure 172/71, pulse 72, temperature 97.6 F (36.4 C), temperature source Oral, resp. rate 18, weight 70.308 kg (155 lb), SpO2 98 %. Physical Exam  Constitutional: She is oriented to person, place, and time. She appears well-developed and well-nourished. No distress.  Cardiovascular: Normal  rate, regular rhythm, normal heart sounds and intact distal pulses.  Exam reveals no gallop and no friction rub.   No murmur heard. Respiratory: Effort normal and breath sounds normal. No respiratory distress. She has no wheezes. She has no rales. She exhibits no tenderness.  GI: Soft. Bowel sounds are normal. She exhibits no distension and no mass. There is no rebound and no guarding.  Negative murphy's sign  Musculoskeletal: Normal range of motion. She exhibits no edema or tenderness.  Neurological: She is alert and oriented to person, place, and time.  Skin: Skin is warm and dry. No rash noted. She is not diaphoretic. No erythema. No pallor.  Psychiatric: She has a normal mood and affect. Her behavior is normal. Judgment and thought content normal.    Assessment/Plan: Symptomatic cholelithiasis-probable cholecystitis, doubt repeat imaging would change recommendations. Given persistent symptoms, the patient would benefit from a laparoscopic cholecystectomy this admission.  We will proceed with a laparoscopic cholecystectomy once renal function and glucose levels have improved.  May have clears, NPO after midnight, rocephin, pain control and antiemetics. DKA-per medicine AKI and dehydration-per medicine    Oneida Arenas [facility] ANP-BC Pager [phone] 06/29/2015, 2:08 PM

## 2015-06-29 NOTE — ED Notes (Signed)
Pt and family made aware of bed assignment 

## 2015-06-29 NOTE — ED Notes (Signed)
CBG reading "HI" on cbg machine prior to insulin administration

## 2015-06-29 NOTE — Progress Notes (Signed)
Repeat BMET gap closed but K 3.4 and phos 1.7 so will replace with IV Kphos. If gap remains closed after follow up BMET then will transition to long acting insulin.  Erin Hearing, ANP

## 2015-06-29 NOTE — ED Notes (Signed)
CBG 195 

## 2015-06-29 NOTE — ED Provider Notes (Signed)
CSN: [id number]     Arrival date & time 06/29/15  0734 History   First MD Initiated Contact with Patient 06/29/15 0920     Chief Complaint  Patient presents with  . Abdominal Pain  . Nausea     (Consider location/radiation/quality/duration/timing/severity/associated sxs/prior Treatment) HPI Comments: Hx of gallstones, diagnosed in August, but wanted to wait until hA1C improved however Sunday pain suddenly worsened after milkshake/onion rings Hasn't had anything solid to eat in 4 days This AM went from laying down to standing up and felt lightheaded and passed out   Patient is a 65 y.o. female presenting with abdominal pain.  Abdominal Pain Pain location:  Epigastric Pain quality: dull   Pain severity:  Moderate Duration:  1 week Timing:  Constant Progression:  Waxing and waning Chronicity:  New Context: eating (or putting anything on stomach)   Worsened by:  Eating Ineffective treatments: tums, nausea medicine. Associated symptoms: diarrhea (yesterday and today), nausea and vomiting (amt deopends on what eating, not able to eat)   Associated symptoms: no chest pain, no chills, no constipation, no cough, no dysuria, no fever, no melena, no shortness of breath and no sore throat   Risk factors: has not had multiple surgeries     Past Medical History  Diagnosis Date  . Hypertension   . Diabetes mellitus without complication ([place])    History reviewed. No pertinent past surgical history. History reviewed. No pertinent family history. Social History  Substance Use Topics  . Smoking status: Never Smoker   . Smokeless tobacco: None  . Alcohol Use: No   OB History    No data available     Review of Systems  Constitutional: Negative for fever and chills.  HENT: Negative for sore throat.   Eyes: Negative for visual disturbance.  Respiratory: Negative for cough and shortness of breath.   Cardiovascular: Negative for chest pain.  Gastrointestinal: Positive for nausea,  vomiting (amt deopends on what eating, not able to eat), abdominal pain and diarrhea (yesterday and today). Negative for constipation and melena.  Genitourinary: Negative for dysuria and difficulty urinating.  Musculoskeletal: Negative for back pain and neck pain.  Skin: Negative for rash.  Neurological: Negative for syncope and headaches.      Allergies  Mercury  Home Medications   Prior to Admission medications   Medication Sig Start Date End Date Taking? Authorizing Provider  cholecalciferol (VITAMIN D) 1000 units tablet Take 1,000 Units by mouth daily. 05/11/15  Yes Historical Provider, MD  glimepiride (AMARYL) 4 MG tablet Take 4 mg by mouth daily with breakfast.   Yes Historical Provider, MD  Insulin Detemir (LEVEMIR FLEXPEN) 100 UNIT/ML Pen Inject 32 Units into the skin daily at 10 pm.   Yes Historical Provider, MD  Lactobacillus (PROBIOTIC ACIDOPHILUS PO) Take by mouth daily.   Yes Historical Provider, MD  losartan-hydrochlorothiazide (HYZAAR) 50-12.5 MG tablet Take 1 tablet by mouth daily. 05/11/15  Yes Historical Provider, MD  metoCLOPramide (REGLAN) 5 MG tablet Take 5 mg by mouth 2 (two) times daily.   Yes Historical Provider, MD  ondansetron (ZOFRAN) 4 MG tablet Take 1 tablet (4 mg total) by mouth every 8 (eight) hours as needed for nausea or vomiting. 06/21/15  Yes Liberty Handy, MD   BP 151/78 mmHg  Pulse 70  Temp(Src) 97.6 F (36.4 C) (Oral)  Resp 18  Wt 155 lb (70.308 kg)  SpO2 99% Physical Exam  Constitutional: She is oriented to person, place, and time. She appears well-developed and well-nourished.  She appears ill. No distress.  HENT:  Head: Normocephalic and atraumatic.  Eyes: Conjunctivae and EOM are normal.  Neck: Normal range of motion.  Cardiovascular: Normal rate, regular rhythm, normal heart sounds and intact distal pulses.  Exam reveals no gallop and no friction rub.   No murmur heard. Pulmonary/Chest: Effort normal and breath sounds normal. No respiratory  distress. She has no wheezes. She has no rales.  Abdominal: Soft. She exhibits no distension. There is no tenderness. There is no guarding.  Musculoskeletal: She exhibits no edema or tenderness.  Neurological: She is alert and oriented to person, place, and time.  Skin: Skin is warm and dry. No rash noted. She is not diaphoretic. No erythema.  Nursing note and vitals reviewed.   ED Course  Procedures (including critical care time) Labs Review Labs Reviewed  LIPASE, BLOOD - Abnormal; Notable for the following:    Lipase 59 (*)    All other components within normal limits  COMPREHENSIVE METABOLIC PANEL - Abnormal; Notable for the following:    Sodium 130 (*)    Chloride 85 (*)    Glucose, Bld 825 (*)    BUN 47 (*)    Creatinine, Ser 1.92 (*)    Albumin 2.9 (*)    Alkaline Phosphatase 134 (*)    GFR calc non Af Amer 27 (*)    GFR calc Af Amer 31 (*)    Anion gap 17 (*)    All other components within normal limits  CBC - Abnormal; Notable for the following:    WBC 13.1 (*)    RBC 5.38 (*)    Platelets 504 (*)    All other components within normal limits  URINALYSIS, ROUTINE W REFLEX MICROSCOPIC (NOT AT [facility]) - Abnormal; Notable for the following:    APPearance HAZY (*)    Glucose, UA >1000 (*)    All other components within normal limits  BASIC METABOLIC PANEL - Abnormal; Notable for the following:    Potassium 3.4 (*)    Chloride 93 (*)    Glucose, Bld 538 (*)    BUN 44 (*)    Creatinine, Ser 1.76 (*)    Calcium 8.4 (*)    GFR calc non Af Amer 30 (*)    GFR calc Af Amer 35 (*)    All other components within normal limits  BASIC METABOLIC PANEL - Abnormal; Notable for the following:    Chloride 97 (*)    Glucose, Bld 433 (*)    BUN 42 (*)    Creatinine, Ser 1.70 (*)    Calcium 8.3 (*)    GFR calc non Af Amer 31 (*)    GFR calc Af Amer 36 (*)    All other components within normal limits  PHOSPHORUS - Abnormal; Notable for the following:    Phosphorus 1.7 (*)    All  other components within normal limits  URINE MICROSCOPIC-ADD ON - Abnormal; Notable for the following:    Squamous Epithelial / LPF 0-5 (*)    Bacteria, UA RARE (*)    All other components within normal limits  CBG MONITORING, ED - Abnormal; Notable for the following:    Glucose-Capillary >600 (*)    All other components within normal limits  CBG MONITORING, ED - Abnormal; Notable for the following:    Glucose-Capillary >600 (*)    All other components within normal limits  CBG MONITORING, ED - Abnormal; Notable for the following:    Glucose-Capillary 477 (*)  All other components within normal limits  CBG MONITORING, ED - Abnormal; Notable for the following:    Glucose-Capillary 442 (*)    All other components within normal limits  CBG MONITORING, ED - Abnormal; Notable for the following:    Glucose-Capillary 419 (*)    All other components within normal limits  CBG MONITORING, ED - Abnormal; Notable for the following:    Glucose-Capillary 259 (*)    All other components within normal limits  CBG MONITORING, ED - Abnormal; Notable for the following:    Glucose-Capillary 194 (*)    All other components within normal limits  CBG MONITORING, ED - Abnormal; Notable for the following:    Glucose-Capillary 138 (*)    All other components within normal limits  CBG MONITORING, ED - Abnormal; Notable for the following:    Glucose-Capillary 117 (*)    All other components within normal limits  URINE CULTURE  MAGNESIUM  BASIC METABOLIC PANEL  BASIC METABOLIC PANEL  HEMOGLOBIN A1C  C-PEPTIDE  BASIC METABOLIC PANEL  LIPASE, BLOOD  HEPATIC FUNCTION PANEL    Imaging Review No results found. I have personally reviewed and evaluated these images and lab results as part of my medical decision-making.   EKG Interpretation None      MDM   Final diagnoses:  Calculus of gallbladder without cholecystitis without obstruction  Hyperglycemia   65 year old female with a history of  hypertension, diabetes and cholelithiasis presents with concern for epigastric abdominal pain.  Patient was seen in the emergency department about one week ago, with epigastric and right upper quadrant pain and had an ultrasound which showed cholelithiasis, and was discharged for outpatient follow-up. Reports that since this evaluation, every time she tries to eat or drink, the pain worsens, and she has been unable to tolerate food by mouth secondary to pain.  Patient reports drinking ginger ale, and now has pain with this. No sig abdominal pain on exam and doubt cholecystitis.  Labs show mildly elevated lipase not consistent with pancreatitis, and mildly elevated alkaline phosphatase, with no other significant changes.  Patient has a glucose in the 800s, with normal bicarbonate and doubt DKA.  HHS in differential, as well as hyperglycemia with gastroparesis.  Feel patient most likely has symptomatic cholelithiasis with hyperglycemia secondary to intake of sugary drinks.  Pt given 8U insulin and NS without change in insulin and was started on insulin gtt and admitted to medicine.  General surgery consulted for concern for symptomatic cholelithiasis.  Gareth Morgan, MD 06/29/15 2032

## 2015-06-29 NOTE — ED Notes (Signed)
Patient seen 1 week ago and diagnosed with gallstones and has not followed up with MD. Has had increased nausea and ongoing pain with no intake for 3-4 days. This am had syncopal event. Alert and oriented on arrival

## 2015-06-29 NOTE — ED Notes (Addendum)
Admitting MD notified of patient's CBG and anion gap. MD to place new orders to transition.

## 2015-06-29 NOTE — H&P (Signed)
[facility] History and Physical                                                                                    Molly [name], is a 65 y.o. female  MRN: [id number]   DOB - 1950-08-21  Admit Date - 06/29/2015  Outpatient Primary MD for the patient is Rogers Blocker, MD  Referring MD: Billy Fischer / ER  Consulting M.D: Tsuei/ general surgery  PMH: Past Medical History  Diagnosis Date  . Hypertension   . Diabetes mellitus without complication ([facility])       PSH: History reviewed. No pertinent past surgical history.   CC:  Chief Complaint  Patient presents with  . Abdominal Pain  . Nausea     HPI: 65 year old female patient with insulin dependent diabetes, hypertension and known cholelithiasis diagnosed in August 2016. Patient was recently evaluated in the ER on 06/21/15 because of abdominal pain associated with nausea and vomiting. At that time patient had mild acute kidney injury within mildly elevated anion gap of 17 and a glucose of 392. Abdominal ultrasound revealed cholelithiasis without evidence of cholecystitis and transaminases were normal without evidence of an obstructive process. She was able to tolerate applesauce prior to discharge and was sent out of the ER with plans to follow-up as needed with general surgery regarding elective cholecystectomy. Unfortunately since that visit patient has continued to decline. She's been unable to eat or drink much of anything without this resulting in nausea and vomiting and right upper quadrant abdominal pain. Because she was not eating she did not take her insulins as prescribed although she states her CBGs never got higher than 300. She is not had fevers or chills. During initial onset of symptomatology she had diarrhea which has now resolved. Nothing she did at home improved her symptoms.  ER Evaluation and treatment: Afebrile, BP 145/77, pulse 86, respirations 18, room air saturations 98%. EKG: Normal sinus rhythm with a  ventricular rate 80 bpm, QTC 476 ms, no acute ischemic changes, altered criteria consistent with LVH. CT abdomen and pelvis from 06/21/15: No acute finding his abdomen or pelvis to account for symptoms; extensive atherosclerosis and what appears to be moderate to severe stenosis of the upper abdominal aorta between the origins of the right and left renal arteries, normal appendix  Abdominal ultrasound on 06/21/15:, Bile duct 5 mm, multiple mobile stones demonstrated within the gallbladder lumen, no evidence of acute cholecystitis. Laboratory data: Na 130, K 4.2, Cl 85, BUN 47, creatinine 1.92, glucose 825, AG 17, alkaline phosphatase 134, lipase 59, AST, ALT, total bilirubin normal, WBC 13,100, platelets 504,000 Normal saline IV bolus 1000 mL Insulin infusion Zofran 4 mg IV 1 dose  Review of Systems   In addition to the HPI above,  No Fever-chills, myalgias or other constitutional symptoms No Headache, changes with Vision or hearing, new weakness, tingling, numbness in any extremity, No problems swallowing food or Liquids No Chest pain, Cough or Shortness of Breath, palpitations, orthopnea or DOE No melena or hematochezia, no dark tarry stools No dysuria, hematuria or flank pain No new skin rashes, lesions, masses or bruises, No new joints pains-aches No recent  weight gain or loss No polyphagia,  *A full 10 point Review of Systems was done, except as stated above, all other Review of Systems were negative.  Social History Social History  Substance Use Topics  . Smoking status: Never Smoker   . Smokeless tobacco: Not on file  . Alcohol Use: Not on file    Resides at: Private residence  Lives with: Significant other  Ambulatory status: Without assistive devices   Family History No family history on file. Discussed with patient who denies significant family history for gastric cancers, cholelithiasis, diabetes or hypertension   Prior to Admission medications   Medication Sig  Start Date End Date Taking? Authorizing Provider  cholecalciferol (VITAMIN D) 1000 units tablet Take 1,000 Units by mouth daily. 05/11/15  Yes Historical Provider, MD  glimepiride (AMARYL) 4 MG tablet Take 4 mg by mouth daily with breakfast.   Yes Historical Provider, MD  Insulin Detemir (LEVEMIR FLEXPEN) 100 UNIT/ML Pen Inject 32 Units into the skin daily at 10 pm.   Yes Historical Provider, MD  Lactobacillus (PROBIOTIC ACIDOPHILUS PO) Take by mouth daily.   Yes Historical Provider, MD  losartan-hydrochlorothiazide (HYZAAR) 50-12.5 MG tablet Take 1 tablet by mouth daily. 05/11/15  Yes Historical Provider, MD  metoCLOPramide (REGLAN) 5 MG tablet Take 5 mg by mouth 2 (two) times daily.   Yes Historical Provider, MD  ondansetron (ZOFRAN) 4 MG tablet Take 1 tablet (4 mg total) by mouth every 8 (eight) hours as needed for nausea or vomiting. 06/21/15  Yes Liberty Handy, MD    Allergies  Allergen Reactions  . Mercury Nausea And Vomiting    Physical Exam  Vitals  Blood pressure 143/69, pulse 72, temperature 97.6 F (36.4 C), temperature source Oral, resp. rate 18, weight 155 lb (70.308 kg), SpO2 99 %.   General:  In no acute distress, appears Sated age and mildly toxic  Psych:  Normal affect, Denies Suicidal or Homicidal ideations, Awake Alert, Oriented X 3. Speech and thought patterns are clear and appropriate, no short term memory deficits  Neuro:   No focal neurological deficits, CN II through XII intact, Strength 5/5 all 4 extremities, Sensation intact all 4 extremities.  ENT:  Ears and Eyes appear Normal, Conjunctivae clear, PER. Moist oral mucosa without erythema or exudates.  Neck:  Supple, No lymphadenopathy appreciated  Respiratory:  Symmetrical chest wall movement, Good air movement bilaterally, CTAB. Room Air  Cardiac:  RRR, No Murmurs, no LE edema noted, no JVD, No carotid bruits, peripheral pulses palpable at 2+  Abdomen:  Positive bowel sounds, Soft, minimally tender right  upper quadrant with subtle guarding but no rebounding, Non distended,  No masses appreciated, no obvious hepatosplenomegaly  Skin:  No Cyanosis, Normal Skin Turgor, No Skin Rash or Bruise.  Extremities: Symmetrical without obvious trauma or injury,  no effusions.  Data Review  CBC  Recent Labs Lab 06/29/15 0758  WBC 13.1*  HGB 14.4  HCT 45.0  PLT 504*  MCV 83.6  MCH 26.8  MCHC 32.0  RDW 13.3    Chemistries   Recent Labs Lab 06/29/15 0758  NA 130*  K 4.2  CL 85*  CO2 28  GLUCOSE 825*  BUN 47*  CREATININE 1.92*  CALCIUM 9.0  AST 29  ALT 25  ALKPHOS 134*  BILITOT 0.7    estimated creatinine clearance is 27.9 mL/min (by C-G formula based on Cr of 1.92).  No results for input(s): TSH, T4TOTAL, T3FREE, THYROIDAB in the last 72 hours.  Invalid  input(s): FREET3  Coagulation profile No results for input(s): INR, PROTIME in the last 168 hours.  No results for input(s): DDIMER in the last 72 hours.  Cardiac Enzymes No results for input(s): CKMB, TROPONINI, MYOGLOBIN in the last 168 hours.  Invalid input(s): CK  Invalid input(s): POCBNP  Urinalysis No results found for: COLORURINE, APPEARANCEUR, LABSPEC, [place], GLUCOSEU, HGBUR, BILIRUBINUR, KETONESUR, PROTEINUR, UROBILINOGEN, NITRITE, LEUKOCYTESUR  Imaging results:   Ct Abdomen Pelvis W Contrast  06/21/2015  CLINICAL DATA:  65 year old female with epigastric pain common nausea and vomiting since yesterday evening. Loose stools. EXAM: CT ABDOMEN AND PELVIS WITH CONTRAST TECHNIQUE: Multidetector CT imaging of the abdomen and pelvis was performed using the standard protocol following bolus administration of intravenous contrast. CONTRAST:  48mL OMNIPAQUE IOHEXOL 300 MG/ML  SOLN COMPARISON:  No priors. FINDINGS: Lower chest: Mild scarring in the lung bases bilaterally. Atherosclerotic calcifications in the left circumflex and right coronary arteries. Mild cardiomegaly. Hepatobiliary: No cystic or solid hepatic  lesions. No intra or extrahepatic biliary ductal dilatation. Gallbladder is normal in appearance. Pancreas: No pancreatic mass. No pancreatic ductal dilatation. No pancreatic or peripancreatic fluid or inflammatory changes. Spleen: Unremarkable. Adrenals/Urinary Tract: Bilateral adrenal glands and left kidney are normal in appearance. Sub cm low-attenuation lesion in the upper pole of the right kidney is too small to definitively characterize, but is likely a cyst. No hydroureteronephrosis. Urinary bladder is normal in appearance. Bilateral adrenal glands are normal in appearance. Stomach/Bowel: Normal appearance of the stomach. No pathologic dilatation of small bowel or colon. Colon is essentially completely decompressed. Normal appendix. Vascular/Lymphatic: Extensive atherosclerosis throughout the abdominal and pelvic vasculature, including an area of moderate to severe stenosis immediately beyond the right renal artery origin and before the left renal artery origin (best demonstrated on images 32 and 33 of series 201). Celiac axis, superior mesenteric artery and inferior mesenteric artery appear patent at this time. No lymphadenopathy noted in the abdomen or pelvis. Reproductive: Uterus and ovaries are unremarkable in appearance. Other: No significant volume of ascites.  No pneumoperitoneum. Musculoskeletal: There are no aggressive appearing lytic or blastic lesions noted in the visualized portions of the skeleton. IMPRESSION: 1. No acute findings in the abdomen or pelvis to account for the patient's symptoms. 2. 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 3. Normal appendix. 4. Additional incidental findings, as above. Electronically Signed   By: Vinnie Langton M.D.   On: 06/21/2015 15:10   US Abdomen Limited Ruq  06/21/2015  CLINICAL DATA:  Patient with right  upper quadrant abdominal pain since last night. EXAM: US ABDOMEN LIMITED - RIGHT UPPER QUADRANT COMPARISON:  Abdominal ultrasound 08/18/2014 FINDINGS: Gallbladder: Multiple mobile stones are demonstrated within the gallbladder lumen. No gallbladder wall thickening. No sonographic Murphy's sign. No pericholecystic fluid. Common bile duct: Diameter: 5 mm Liver: No focal lesion identified. Within normal limits in parenchymal echogenicity. IMPRESSION: Cholelithiasis without sonographic evidence for acute cholecystitis. Electronically Signed   By: Lovey Newcomer M.D.   On: 06/21/2015 12:25     EKG: (Independently reviewed) Normal sinus rhythm with a ventricular rate 80 bpm, QTC 476 ms, no acute ischemic changes, altered criteria consistent with LVH.   Assessment & Plan  Principal Problem:   DKA  -Multifactorial etiology secondary to ongoing nausea and vomiting possibly from low-grade atypical cholecystitis further exacerbated by noncompliance with utilization of prescribed insulin at home -SDU/Inpatient -  Insulin infusion with glucose stabilizer protocol -BMET Q 4hrs -Volume resuscitation with transition to dextrose containing fluids once CBGs less than 250 -Transition to long-acting insulin once anion gap closed and serum CO2 normalized -Check hemoglobin A1c and C-peptide level -Home Amaryl on hold -Noncaloric clear liquids -Symptom management with Zofran  Active Problems:   AKI  -Secondary to dehydration in setting of DKA -Hold preadmission ARB with thiazide diuretic    Dehydration with hyponatremia -Secondary to DKA with associated pseudohyponatremia -Volume repletion as above    Cholelithiasis -Patient does have elevated alkaline phosphatase with normal total bilirubin, AST and ALT -Lipase mildly elevated but this is more likely related to DKA but we'll continue to monitor -Previous ultrasound revealed borderline dilated common bile duct 5 mm    Leukocytosis -Possibly from smoldering  cholecystitis but more likely reflective of volume depletion -Repeat CBC in a.m.    HTN  -Moderately controlled -Hydralazine prn    Abdominal aortic atherosclerosis with stenosis -Incidental finding on CT of the abdomen and pelvis -Echocardiogram  -May need vascular abdominal ultrasound to better clarify    DVT Prophylaxis: Subcutaneous heparin  Family Communication:  Husband at bedside   Code Status:  Full code  Condition:  Stable  Discharge disposition: Once medically stable and able to tolerate diet without nausea and vomiting anticipate discharge home; unclear full undergo surgical procedure this admission  Time spent in minutes : 60      ELLIS,ALLISON L. ANP on 06/29/2015 at 12:40 PM  You may contact me by going to [email or web address] - password [email or web address]  I am available from 7a-7p but please confirm I am on the schedule by going to Amion as above.   After 7p please contact night coverage person covering me after hours  [facility]

## 2015-06-29 NOTE — ED Notes (Signed)
CBG- 477

## 2015-06-30 ENCOUNTER — Encounter (HOSPITAL_COMMUNITY)
Admission: EM | Disposition: A | Discharge status: Home Health | Payer: Self-pay | Source: Home / Self Care | Attending: Family Medicine

## 2015-06-30 ENCOUNTER — Encounter (HOSPITAL_COMMUNITY): Payer: Self-pay | Admitting: *Deleted

## 2015-06-30 ENCOUNTER — Inpatient Hospital Stay (HOSPITAL_COMMUNITY): Payer: Medicare Other | Admitting: Certified Registered"

## 2015-06-30 ENCOUNTER — Inpatient Hospital Stay (HOSPITAL_COMMUNITY): Payer: Medicare Other

## 2015-06-30 DIAGNOSIS — E1311 Other specified diabetes mellitus with ketoacidosis with coma: Secondary | ICD-10-CM

## 2015-06-30 DIAGNOSIS — K819 Cholecystitis, unspecified: Secondary | ICD-10-CM

## 2015-06-30 DIAGNOSIS — N179 Acute kidney failure, unspecified: Secondary | ICD-10-CM

## 2015-06-30 HISTORY — PX: CHOLECYSTECTOMY: SHX55

## 2015-06-30 LAB — BASIC METABOLIC PANEL
Anion gap: 10 (ref 5–15)
BUN: 33 mg/dL — ABNORMAL HIGH (ref 6–20)
CALCIUM: 8 mg/dL — AB (ref 8.9–10.3)
CHLORIDE: 100 mmol/L — AB (ref 101–111)
CO2: 29 mmol/L (ref 22–32)
CREATININE: 1.46 mg/dL — AB (ref 0.44–1.00)
GFR, EST AFRICAN AMERICAN: 43 mL/min — AB (ref >60)
GFR, EST NON AFRICAN AMERICAN: 37 mL/min — AB (ref >60)
Glucose, Bld: 99 mg/dL (ref 65–99)
Potassium: 3.5 mmol/L (ref 3.5–5.1)
SODIUM: 139 mmol/L (ref 135–145)

## 2015-06-30 LAB — GLUCOSE, CAPILLARY
GLUCOSE-CAPILLARY: 152 mg/dL — AB (ref 65–99)
GLUCOSE-CAPILLARY: 104 mg/dL — AB (ref 65–99)
GLUCOSE-CAPILLARY: 52 mg/dL — AB (ref 65–99)
GLUCOSE-CAPILLARY: 87 mg/dL (ref 65–99)
GLUCOSE-CAPILLARY: 94 mg/dL (ref 65–99)
GLUCOSE-CAPILLARY: 85 mg/dL (ref 65–99)
GLUCOSE-CAPILLARY: 74 mg/dL (ref 65–99)
GLUCOSE-CAPILLARY: 56 mg/dL — AB (ref 65–99)
GLUCOSE-CAPILLARY: 153 mg/dL — AB (ref 65–99)
GLUCOSE-CAPILLARY: 88 mg/dL (ref 65–99)
GLUCOSE-CAPILLARY: 99 mg/dL (ref 65–99)

## 2015-06-30 LAB — HEPATIC FUNCTION PANEL
ALBUMIN: 2.2 g/dL — AB (ref 3.5–5.0)
ALT: 23 U/L (ref 14–54)
AST: 32 U/L (ref 15–41)
Alkaline Phosphatase: 104 U/L (ref 38–126)
Bilirubin, Direct: <0.1 mg/dL — ABNORMAL LOW (ref 0.1–0.5)
Total Bilirubin: 0.5 mg/dL (ref 0.3–1.2)
Total Protein: 6.1 g/dL — ABNORMAL LOW (ref 6.5–8.1)

## 2015-06-30 LAB — C-PEPTIDE: C PEPTIDE: 3.9 ng/mL (ref 1.1–4.4)

## 2015-06-30 LAB — SURGICAL PCR SCREEN
MRSA, PCR: NEGATIVE
STAPHYLOCOCCUS AUREUS: NEGATIVE

## 2015-06-30 LAB — LIPASE, BLOOD: LIPASE: 27 U/L (ref 11–51)

## 2015-06-30 LAB — HEMOGLOBIN A1C
Hgb A1c MFr Bld: 10.9 % — ABNORMAL HIGH (ref 4.8–5.6)
Mean Plasma Glucose: 266 mg/dL

## 2015-06-30 SURGERY — LAPAROSCOPIC CHOLECYSTECTOMY WITH INTRAOPERATIVE CHOLANGIOGRAM
Anesthesia: General | Site: Abdomen

## 2015-06-30 MED ORDER — DEXTROSE 50 % IV SOLN
25.0000 mL | Freq: Once | INTRAVENOUS | Status: AC
Start: 2015-06-30 — End: 2015-07-04
  Administered 2015-07-04: 25 mL via INTRAVENOUS
  Filled 2015-06-30: qty 50

## 2015-06-30 MED ORDER — BUPIVACAINE-EPINEPHRINE 0.25% -1:200000 IJ SOLN
INTRAMUSCULAR | Status: DC | PRN
  Administered 2015-06-30: 8 mL

## 2015-06-30 MED ORDER — ROCURONIUM BROMIDE 100 MG/10ML IV SOLN
INTRAVENOUS | Status: DC | PRN
  Administered 2015-06-30: 40 mg via INTRAVENOUS

## 2015-06-30 MED ORDER — MIDAZOLAM HCL 2 MG/2ML IJ SOLN
INTRAMUSCULAR | Status: AC
  Filled 2015-06-30: qty 2

## 2015-06-30 MED ORDER — FENTANYL CITRATE (PF) 100 MCG/2ML IJ SOLN
INTRAMUSCULAR | Status: DC | PRN
  Administered 2015-06-30: 150 ug via INTRAVENOUS
  Administered 2015-06-30: 100 ug via INTRAVENOUS

## 2015-06-30 MED ORDER — OXYCODONE-ACETAMINOPHEN 5-325 MG PO TABS
ORAL_TABLET | ORAL | Status: AC
  Filled 2015-06-30: qty 1

## 2015-06-30 MED ORDER — PROPOFOL 10 MG/ML IV BOLUS
INTRAVENOUS | Status: AC
  Filled 2015-06-30: qty 20

## 2015-06-30 MED ORDER — SODIUM CHLORIDE 0.9 % IR SOLN
Status: DC | PRN
  Administered 2015-06-30: 1000 mL

## 2015-06-30 MED ORDER — LIDOCAINE HCL (CARDIAC) 20 MG/ML IV SOLN
INTRAVENOUS | Status: AC
  Filled 2015-06-30: qty 5

## 2015-06-30 MED ORDER — LACTATED RINGERS IV SOLN
INTRAVENOUS | Status: DC
  Administered 2015-06-30 (×2): via INTRAVENOUS

## 2015-06-30 MED ORDER — MORPHINE SULFATE (PF) 2 MG/ML IV SOLN: 2.0000 mg | INTRAVENOUS | Status: DC | PRN

## 2015-06-30 MED ORDER — SODIUM CHLORIDE 0.9 % IV SOLN
INTRAVENOUS | Status: DC | PRN
  Administered 2015-06-30: 1 mL

## 2015-06-30 MED ORDER — SUGAMMADEX SODIUM 500 MG/5ML IV SOLN
INTRAVENOUS | Status: AC
  Filled 2015-06-30: qty 5

## 2015-06-30 MED ORDER — ONDANSETRON HCL 4 MG/2ML IJ SOLN: 4.0000 mg | Freq: Once | INTRAMUSCULAR | Status: DC | PRN

## 2015-06-30 MED ORDER — EPHEDRINE SULFATE 50 MG/ML IJ SOLN
INTRAMUSCULAR | Status: DC | PRN
  Administered 2015-06-30: 10 mg via INTRAVENOUS

## 2015-06-30 MED ORDER — MIDAZOLAM HCL 5 MG/5ML IJ SOLN
INTRAMUSCULAR | Status: DC | PRN
  Administered 2015-06-30: 2 mg via INTRAVENOUS

## 2015-06-30 MED ORDER — OXYCODONE HCL 5 MG PO TABS
5.0000 mg | ORAL_TABLET | Freq: Once | ORAL | Status: DC | PRN
  Administered 2015-06-30: 5 mg via ORAL

## 2015-06-30 MED ORDER — SUGAMMADEX SODIUM 500 MG/5ML IV SOLN
INTRAVENOUS | Status: DC | PRN
  Administered 2015-06-30: 200 mg via INTRAVENOUS

## 2015-06-30 MED ORDER — PROPOFOL 10 MG/ML IV BOLUS
INTRAVENOUS | Status: DC | PRN
  Administered 2015-06-30: 150 mg via INTRAVENOUS

## 2015-06-30 MED ORDER — ROCURONIUM BROMIDE 50 MG/5ML IV SOLN
INTRAVENOUS | Status: AC
  Filled 2015-06-30: qty 1

## 2015-06-30 MED ORDER — FENTANYL CITRATE (PF) 250 MCG/5ML IJ SOLN
INTRAMUSCULAR | Status: AC
  Filled 2015-06-30: qty 5

## 2015-06-30 MED ORDER — 0.9 % SODIUM CHLORIDE (POUR BTL) OPTIME
TOPICAL | Status: DC | PRN
  Administered 2015-06-30: 1000 mL

## 2015-06-30 MED ORDER — FENTANYL CITRATE (PF) 100 MCG/2ML IJ SOLN
25.0000 ug | INTRAMUSCULAR | Status: DC | PRN
  Administered 2015-06-30 (×2): 25 ug via INTRAVENOUS

## 2015-06-30 MED ORDER — OXYCODONE HCL 5 MG/5ML PO SOLN: 5.0000 mg | Freq: Once | ORAL | Status: DC | PRN

## 2015-06-30 MED ORDER — PHENYLEPHRINE 40 MCG/ML (10ML) SYRINGE FOR IV PUSH (FOR BLOOD PRESSURE SUPPORT)
PREFILLED_SYRINGE | INTRAVENOUS | Status: AC
  Filled 2015-06-30: qty 10

## 2015-06-30 MED ORDER — OXYCODONE-ACETAMINOPHEN 5-325 MG PO TABS
1.0000 | ORAL_TABLET | ORAL | Status: DC | PRN
  Administered 2015-06-30 – 2015-07-01 (×3): 1 via ORAL
  Filled 2015-06-30 (×2): qty 1

## 2015-06-30 MED ORDER — LIDOCAINE HCL (CARDIAC) 20 MG/ML IV SOLN
INTRAVENOUS | Status: DC | PRN
  Administered 2015-06-30: 60 mg via INTRAVENOUS

## 2015-06-30 MED ORDER — PHENYLEPHRINE HCL 10 MG/ML IJ SOLN
INTRAMUSCULAR | Status: DC | PRN
  Administered 2015-06-30: 120 ug via INTRAVENOUS
  Administered 2015-06-30 (×3): 80 ug via INTRAVENOUS

## 2015-06-30 MED ORDER — OXYCODONE HCL 5 MG PO TABS
ORAL_TABLET | ORAL | Status: AC
  Filled 2015-06-30: qty 1

## 2015-06-30 MED ORDER — FENTANYL CITRATE (PF) 100 MCG/2ML IJ SOLN
INTRAMUSCULAR | Status: AC
  Administered 2015-06-30: 25 ug via INTRAVENOUS
  Filled 2015-06-30: qty 2

## 2015-06-30 MED ORDER — BUPIVACAINE-EPINEPHRINE (PF) 0.25% -1:200000 IJ SOLN
INTRAMUSCULAR | Status: AC
  Filled 2015-06-30: qty 30

## 2015-06-30 SURGICAL SUPPLY — 56 items
APPLICATOR COTTON TIP 6IN STRL (MISCELLANEOUS) ×3 IMPLANT
APPLIER CLIP ROT 10 11.4 M/L (STAPLE) ×3
BENZOIN TINCTURE PRP APPL 2/3 (GAUZE/BANDAGES/DRESSINGS) ×3 IMPLANT
BLADE SURG ROTATE 9660 (MISCELLANEOUS) IMPLANT
CANISTER SUCTION 2500CC (MISCELLANEOUS) ×3 IMPLANT
CHLORAPREP W/TINT 26ML (MISCELLANEOUS) ×3 IMPLANT
CLIP APPLIE ROT 10 11.4 M/L (STAPLE) ×1 IMPLANT
CLOSURE WOUND 1/2 X4 (GAUZE/BANDAGES/DRESSINGS) ×1
COVER MAYO STAND STRL (DRAPES) ×3 IMPLANT
COVER SURGICAL LIGHT HANDLE (MISCELLANEOUS) ×3 IMPLANT
DRAPE C-ARM 42X72 X-RAY (DRAPES) ×3 IMPLANT
DRSG TEGADERM 2-3/8X2-3/4 SM (GAUZE/BANDAGES/DRESSINGS) ×9 IMPLANT
DRSG TEGADERM 4X4.75 (GAUZE/BANDAGES/DRESSINGS) ×3 IMPLANT
ELECT REM PT RETURN 9FT ADLT (ELECTROSURGICAL) ×3
ELECTRODE REM PT RTRN 9FT ADLT (ELECTROSURGICAL) ×1 IMPLANT
FILTER SMOKE EVAC LAPAROSHD (FILTER) ×3 IMPLANT
GAUZE SPONGE 2X2 8PLY STRL LF (GAUZE/BANDAGES/DRESSINGS) ×1 IMPLANT
GLOVE BIO SURGEON STRL SZ7 (GLOVE) ×3 IMPLANT
GLOVE BIO SURGEON STRL SZ7.5 (GLOVE) ×6 IMPLANT
GLOVE BIO SURGEON STRL SZ8 (GLOVE) ×3 IMPLANT
GLOVE BIOGEL PI IND STRL 7.0 (GLOVE) ×1 IMPLANT
GLOVE BIOGEL PI IND STRL 7.5 (GLOVE) ×2 IMPLANT
GLOVE BIOGEL PI IND STRL 8 (GLOVE) ×1 IMPLANT
GLOVE BIOGEL PI IND STRL 8.5 (GLOVE) ×1 IMPLANT
GLOVE BIOGEL PI INDICATOR 7.0 (GLOVE) ×2
GLOVE BIOGEL PI INDICATOR 7.5 (GLOVE) ×4
GLOVE BIOGEL PI INDICATOR 8 (GLOVE) ×2
GLOVE BIOGEL PI INDICATOR 8.5 (GLOVE) ×2
GLOVE ECLIPSE 7.0 STRL STRAW (GLOVE) ×6 IMPLANT
GLOVE ECLIPSE 7.5 STRL STRAW (GLOVE) ×3 IMPLANT
GOWN STRL REUS W/ TWL LRG LVL3 (GOWN DISPOSABLE) ×4 IMPLANT
GOWN STRL REUS W/TWL LRG LVL3 (GOWN DISPOSABLE) ×8
GOWN STRL REUS W/TWL XL LVL3 (GOWN DISPOSABLE) ×3 IMPLANT
KIT BASIN OR (CUSTOM PROCEDURE TRAY) ×3 IMPLANT
KIT ROOM TURNOVER OR (KITS) ×3 IMPLANT
NS IRRIG 1000ML POUR BTL (IV SOLUTION) ×3 IMPLANT
PAD ARMBOARD 7.5X6 YLW CONV (MISCELLANEOUS) ×3 IMPLANT
POUCH RETRIEVAL ECOSAC 10 (ENDOMECHANICALS) ×1 IMPLANT
POUCH RETRIEVAL ECOSAC 10MM (ENDOMECHANICALS) ×2
POUCH SPECIMEN RETRIEVAL 10MM (ENDOMECHANICALS) ×3 IMPLANT
SCISSORS LAP 5X35 DISP (ENDOMECHANICALS) ×3 IMPLANT
SET CHOLANGIOGRAPH 5 50 .035 (SET/KITS/TRAYS/PACK) ×3 IMPLANT
SET IRRIG TUBING LAPAROSCOPIC (IRRIGATION / IRRIGATOR) ×3 IMPLANT
SLEEVE ENDOPATH XCEL 5M (ENDOMECHANICALS) ×3 IMPLANT
SPECIMEN JAR SMALL (MISCELLANEOUS) ×3 IMPLANT
SPONGE GAUZE 2X2 STER 10/PKG (GAUZE/BANDAGES/DRESSINGS) ×2
STRIP CLOSURE SKIN 1/2X4 (GAUZE/BANDAGES/DRESSINGS) ×2 IMPLANT
SUT MNCRL AB 4-0 PS2 18 (SUTURE) ×3 IMPLANT
SUT VICRYL 0 UR6 27IN ABS (SUTURE) ×3 IMPLANT
TOWEL OR 17X24 6PK STRL BLUE (TOWEL DISPOSABLE) ×3 IMPLANT
TOWEL OR 17X26 10 PK STRL BLUE (TOWEL DISPOSABLE) ×3 IMPLANT
TRAY LAPAROSCOPIC MC (CUSTOM PROCEDURE TRAY) ×3 IMPLANT
TROCAR XCEL BLUNT TIP 100MML (ENDOMECHANICALS) ×3 IMPLANT
TROCAR XCEL NON-BLD 11X100MML (ENDOMECHANICALS) ×3 IMPLANT
TROCAR XCEL NON-BLD 5MMX100MML (ENDOMECHANICALS) ×3 IMPLANT
TUBING INSUFFLATION (TUBING) ×3 IMPLANT

## 2015-06-30 NOTE — Anesthesia Preprocedure Evaluation (Addendum)

## 2015-06-30 NOTE — Care Management Note (Signed)
Case Management Note  Patient Details  Name: MELAH SCHUPBACH MRN: [id number] Date of Birth: [birth year]  Subjective/Objective:                    Action/Plan: Patient was admitted with DKA, cholecystitis.  Will follow for discharge needs pending patient's progress and plan of care.   Expected Discharge Date:  07/03/15               Expected Discharge Plan:     In-House Referral:     Discharge planning Services     Post Acute Care Choice:    Choice offered to:     DME Arranged:    DME Agency:     HH Arranged:    HH Agency:     Status of Service:  In process, will continue to follow  Medicare Important Message Given:    Date Medicare IM Given:    Medicare IM give by:    Date Additional Medicare IM Given:    Additional Medicare Important Message give by:     If discussed at [place] of Stay Meetings, dates discussed:    Additional Comments:  Rolm Baptise, RN 06/30/2015, 11:36 AM [phone]

## 2015-06-30 NOTE — Progress Notes (Signed)
  Subjective: Abdominal pain is improved. Mildly nauseated Patient's blood sugar was low around 5:30 this morning, so she was given a graham cracker (despite being NPO).  Otherwise, she has been NPO.  Objective: Vital signs in last 24 hours: Temp:  [98.7 F (37.1 C)-99.3 F (37.4 C)] 98.7 F (37.1 C) (03/15 0636) Pulse Rate:  [69-120] 81 (03/15 0636) Resp:  [18-20] 18 (03/15 0636) BP: (133-187)/(57-90) 133/57 mmHg (03/15 0636) SpO2:  [95 %-100 %] 98 % (03/15 0636) Weight:  [69.128 kg (152 lb 6.4 oz)] 69.128 kg (152 lb 6.4 oz) (03/14 2331) Last BM Date: 06/27/15  Intake/Output from previous day: 03/14 0701 - 03/15 0700 In: 350 [I.V.:350] Out: -  Intake/Output this shift:    General appearance: alert, cooperative and no distress GI: mild RUQ/ epigastric tenderness  Lab Results:   Recent Labs  06/29/15 0758  WBC 13.1*  HGB 14.4  HCT 45.0  PLT 504*   BMET  Recent Labs  06/29/15 1922 06/30/15 0512  NA 138 139  K 3.4* 3.5  CL 98* 100*  CO2 30 29  GLUCOSE 129* 99  BUN 39* 33*  CREATININE 1.60* 1.46*  CALCIUM 8.5* 8.0*   PT/INR No results for input(s): LABPROT, INR in the last 72 hours. ABG No results for input(s): PHART, HCO3 in the last 72 hours.  Invalid input(s): PCO2, PO2  Studies/Results: No results found.  Anti-infectives: Anti-infectives    Start     Dose/Rate Route Frequency Ordered Stop   06/29/15 1430  cefTRIAXone (ROCEPHIN) 2 g in dextrose 5 % 50 mL IVPB    Comments:  Pharmacy may adjust dosing strength / duration / interval for maximal efficacy   2 g 100 mL/hr over 30 Minutes Intravenous Every 24 hours 06/29/15 1428        Assessment/Plan: Acute calculus cholecystitis DKA - improved Renal function improved  Plan laparoscopic cholecystectomy with intraoperative cholangiogram.   The surgical procedure has been discussed with the patient.  Potential risks, benefits, alternative treatments, and expected outcomes have been explained.   All of the patient's questions at this time have been answered.  The likelihood of reaching the patient's treatment goal is good.  The patient understand the proposed surgical procedure and wishes to proceed.   LOS: 1 day    Garry Bochicchio K. 06/30/2015

## 2015-06-30 NOTE — Progress Notes (Signed)
[facility] PROGRESS NOTE  Molly [name] [id number] DOB: 10-20-1950 DOA: 06/29/2015 PCP: Rogers Blocker, MD  Assessment/Plan: Principal Problem:   DKA (diabetic ketoacidoses) ([place]) - Continue current medical management  Active Problems:   HTN (hypertension) - Mildly elevated. Relatively well controlled    AKI (acute kidney injury) ([place]) - improving and most likely due to poor oral intake.    Abdominal aortic atherosclerosis with stenosis   Dehydration with hyponatremia - Most likely due to poor oral intake. Resolved with IVF   Leukocytosis - Most likely due to cholecystitis - on Rocephin with unclear reason why patient is on rocephin. Will discontinue next am unless general surgery team wants to continue    Cholecystitis -Gen. surgery on board and planning laparoscopic cholecystectomy with intraoperative cholangiogram  Code Status: full Family Communication: None at bedside Disposition Plan: pending improvement in condition   Consultants:  General surgery  Procedures:  none  Antibiotics:  Rocephin   HPI/Subjective: Pt has no new complaints no acute issues overnight. She states today is that she has felt in the last week.  Objective: Filed Vitals:   06/30/15 1545 06/30/15 1607  BP:  147/61  Pulse: 74 90  Temp: 98 F (36.7 C) 97.9 F (36.6 C)  Resp: 20 18    Intake/Output Summary (Last 24 hours) at 06/30/15 1612 Last data filed at 06/30/15 1430  Gross per 24 hour  Intake   1650 ml  Output     32 ml  Net   1618 ml   Filed Weights   06/29/15 0747 06/29/15 2331  Weight: 70.308 kg (155 lb) 69.128 kg (152 lb 6.4 oz)    Exam:   General:  Patient in no acute distress alert and awake  Cardiovascular: S1 and S2 within normal limits, no rubs  Respiratory: No increased work of breathing, no wheezes, equal chest rise  Abdomen: Nondistended, no guarding  Musculoskeletal: No cyanosis or clubbing  Data Reviewed: Basic Metabolic Panel:  Recent  Labs Lab 06/29/15 0758 06/29/15 1221 06/29/15 1530 06/29/15 1922 06/29/15 1939 06/30/15 0512  NA 130* 135 135 138  --  139  K 4.2 3.4* 3.5 3.4*  --  3.5  CL 85* 93* 97* 98*  --  100*  CO2 28 28 27 30   --  29  GLUCOSE 825* 538* 433* 129*  --  99  BUN 47* 44* 42* 39*  --  33*  CREATININE 1.92* 1.76* 1.70* 1.60*  --  1.46*  CALCIUM 9.0 8.4* 8.3* 8.5*  --  8.0*  MG  --  2.1  --   --   --   --   PHOS  --  1.7*  --   --  1.1*  --    Liver Function Tests:  Recent Labs Lab 06/29/15 0758 06/30/15 0512  AST 29 32  ALT 25 23  ALKPHOS 134* 104  BILITOT 0.7 0.5  PROT 7.3 6.1*  ALBUMIN 2.9* 2.2*    Recent Labs Lab 06/29/15 0758 06/30/15 0512  LIPASE 59* 27   No results for input(s): AMMONIA in the last 168 hours. CBC:  Recent Labs Lab 06/29/15 0758  WBC 13.1*  HGB 14.4  HCT 45.0  MCV 83.6  PLT 504*   Cardiac Enzymes: No results for input(s): CKTOTAL, CKMB, CKMBINDEX, TROPONINI in the last 168 hours. BNP (last 3 results) No results for input(s): BNP in the last 8760 hours.  ProBNP (last 3 results) No results for input(s): PROBNP in the last 8760 hours.  CBG:  Recent Labs Lab 06/30/15 0336 06/30/15 0440 06/30/15 0636 06/30/15 0951 06/30/15 1437  GLUCAP 52* 87 94 85 74    Recent Results (from the past 240 hour(s))  Urine culture     Status: None (Preliminary result)   Collection Time: 06/29/15  4:36 PM  Result Value Ref Range Status   Specimen Description URINE, CLEAN CATCH  Final   Special Requests NONE  Final   Culture CULTURE REINCUBATED FOR BETTER GROWTH  Final   Report Status PENDING  Incomplete  Surgical pcr screen     Status: None   Collection Time: 06/30/15 10:17 AM  Result Value Ref Range Status   MRSA, PCR NEGATIVE NEGATIVE Final   Staphylococcus aureus NEGATIVE NEGATIVE Final    Comment:        The Xpert SA Assay (FDA approved for NASAL specimens in patients over 70 years of age), is one component of a comprehensive  surveillance program.  Test performance has been validated by [facility] for patients greater than or equal to 52 year old. It is not intended to diagnose infection nor to guide or monitor treatment.      Studies: Dg Cholangiogram Operative  06/30/2015  CLINICAL DATA:  Laparoscopic cholecystectomy. EXAM: INTRAOPERATIVE CHOLANGIOGRAM TECHNIQUE: Cholangiographic images from the C-arm fluoroscopic device were submitted for interpretation post-operatively. Please see the procedural report for the amount of contrast and the fluoroscopy time utilized. COMPARISON:  CT 06/21/2015 FINDINGS: No persistent filling defects in the common duct. Intrahepatic ducts are incompletely visualized, appearing decompressed centrally. Contrast passes into the duodenum. : Negative for retained common duct stone. Electronically Signed   By: Lucrezia Europe M.D.   On: 06/30/2015 15:54    Scheduled Meds: . cefTRIAXone (ROCEPHIN)  IV  2 g Intravenous Q24H  . insulin aspart  0-15 Units Subcutaneous TID WC  . insulin aspart  0-5 Units Subcutaneous QHS  . insulin detemir  32 Units Subcutaneous QHS  . oxyCODONE      . oxyCODONE-acetaminophen      . potassium phosphate IVPB (mmol)  30 mmol Intravenous Once   Continuous Infusions: . sodium chloride 100 mL/hr at 06/29/15 2203  . insulin (NOVOLIN-R) infusion Stopped (06/30/15 0334)  . lactated ringers Stopped (06/30/15 1430)    Time spent: > 35 minutes  Velvet Bathe  [facility] Pager [phone]. If 7PM-7AM, please contact night-coverage at [email or web address], password [facility] 06/30/2015, 4:12 PM  LOS: 1 day

## 2015-06-30 NOTE — Op Note (Signed)
Laparoscopic Cholecystectomy with IOC Procedure Note  Indications: This patient presents with symptomatic gallbladder disease and will undergo laparoscopic cholecystectomy.  Pre-operative Diagnosis: Calculus of gallbladder with acute cholecystitis, without mention of obstruction  Post-operative Diagnosis: Gangrenous cholecystitis  Surgeon: Dalaysia Harms K.   Assistants: Dr. Doreen Salvage  Anesthesia: General endotracheal anesthesia  ASA Class: 2E  Procedure Details  The patient was seen again in the Holding Room. The risks, benefits, complications, treatment options, and expected outcomes were discussed with the patient. The possibilities of reaction to medication, pulmonary aspiration, perforation of viscus, bleeding, recurrent infection, finding a normal gallbladder, the need for additional procedures, failure to diagnose a condition, the possible need to convert to an open procedure, and creating a complication requiring transfusion or operation were discussed with the patient. The likelihood of improving the patient's symptoms with return to their baseline status is good.  The patient and/or family concurred with the proposed plan, giving informed consent. The site of surgery properly noted. The patient was taken to Operating Room, identified as Molly [name] and the procedure verified as Laparoscopic Cholecystectomy with Intraoperative Cholangiogram. A Time Out was held and the above information confirmed.  Prior to the induction of general anesthesia, antibiotic prophylaxis was administered. General endotracheal anesthesia was then administered and tolerated well. After the induction, the abdomen was prepped with Chloraprep and draped in the sterile fashion. The patient was positioned in the supine position.  Local anesthetic agent was injected into the skin near the umbilicus and an incision made. We dissected down to the abdominal fascia with blunt dissection.  The fascia was incised  vertically and we entered the peritoneal cavity bluntly.  A pursestring suture of 0-Vicryl was placed around the fascial opening.  The Hasson cannula was inserted and secured with the stay suture.  Pneumoperitoneum was then created with CO2 and tolerated well without any adverse changes in the patient's vital signs. An 11-mm port was placed in the subxiphoid position.  Two 5-mm ports were placed in the right upper quadrant. All skin incisions were infiltrated with a local anesthetic agent before making the incision and placing the trocars.   We positioned the patient in reverse Trendelenburg, tilted slightly to the patient's left.  The omentum was densely adherent to the edge of the liver and the gallbladder was not visualized.  I began bluntly dissecting the omentum away from the edge of the liver and we encountered a large amount of bilious fluid.  We aspirated this fluid, and a dilated thickened gallbladder was visualized.  The gallbladder was decompressed with the suction aspirated, then  the fundus was grasped and retracted cephalad. Adhesions were lysed bluntly and with the electrocautery where indicated, taking care not to injure any adjacent organs or viscus. The infundibulum was grasped and retracted laterally, exposing the peritoneum overlying the triangle of Calot. This was then divided and exposed in a blunt fashion. A critical view of the cystic duct and cystic artery was obtained.  The cystic duct was clearly identified and bluntly dissected circumferentially. The cystic duct was ligated with a clip distally.   An incision was made in the cystic duct and the [facility] cholangiogram catheter introduced. The catheter was secured using a clip. A cholangiogram was then obtained which showed good visualization of the distal and proximal biliary tree with no sign of filling defects or obstruction.  Contrast flowed easily into the duodenum. The catheter was then removed.   The cystic duct was then ligated with  clips and divided.  The cystic artery was identified, dissected free, ligated with clips and divided as well.   The gallbladder was dissected from the liver bed in retrograde fashion with the electrocautery. This was extremely difficult due to the size of the gallbladder and the degree of inflammation.  The gallbladder was removed and placed in an Eco sac. The liver bed was irrigated and inspected. The spilled stones were removed.  Hemostasis was achieved with the electrocautery. Copious irrigation was utilized and was repeatedly aspirated until clear.  The gallbladder and sac were then removed through the umbilical port site.  We had to enlarged the fascial opening to remove the gallbladder.  The fascia was closed with interrupted 0 Vicryl.  We again inspected the right upper quadrant for hemostasis.  Pneumoperitoneum was released as we removed the trocars.  4-0 Monocryl was used to close the skin.   Benzoin, steri-strips, and clean dressings were applied. The patient was then extubated and brought to the recovery room in stable condition. Instrument, sponge, and needle counts were correct at closure and at the conclusion of the case.   Findings: Gangrenous cholecystitis with Cholelithiasis and surrounding biloma  Estimated Blood Loss: less than 100 mL         Drains: none         Specimens: Gallbladder           Complications: None; patient tolerated the procedure well.         Disposition: PACU - hemodynamically stable.         Condition: stable  Imogene Burn. Georgette Dover, MD, [facility] Surgery  General/ Trauma Surgery  06/30/2015 2:20 PM

## 2015-06-30 NOTE — Progress Notes (Signed)
Stopped insulin gtt at 334, blood glucose was 54 mg/dl after stopping gtt. Prior blood glucose was 105 mg/dl. Patient given milk and crackers to stabilize glucose. Will recheck glucose in 15-20 min.

## 2015-06-30 NOTE — Anesthesia Postprocedure Evaluation (Signed)
Anesthesia Post Note  Patient: Molly [name]  Procedure(s) Performed: Procedure(s) (LRB): LAPAROSCOPIC CHOLECYSTECTOMY WITH INTRAOPERATIVE CHOLANGIOGRAM (N/A)  Patient location during evaluation: PACU Anesthesia Type: General Level of consciousness: sedated Pain management: pain level controlled Vital Signs Assessment: post-procedure vital signs reviewed and stable Respiratory status: spontaneous breathing and respiratory function stable Cardiovascular status: stable Anesthetic complications: no    Last Vitals:  Filed Vitals:   06/30/15 1450 06/30/15 1500  BP: 186/93   Pulse: 77 76  Temp:    Resp: 22 16    Last Pain:  Filed Vitals:   06/30/15 1503  PainSc: 5                  Thomasina Housley DANIEL

## 2015-06-30 NOTE — Progress Notes (Signed)
Patient transferred to or/short stay

## 2015-06-30 NOTE — Progress Notes (Addendum)
Admitted to 5C17 via stretcher.  Patient generally weak, nauseated. Oriented to room, safety precautions & goal to stop insulin gtt.

## 2015-06-30 NOTE — Progress Notes (Addendum)
Patient transferred back to 5C17. Patient is alert, slow to respond at times, oriented x4, drowsy. VSS. CBG 54 on arrival to floor, 25 mL D50 given. Will recheck and notify MD. Will continue to monitor closely. Family at bedside  Incision x4 covered, no drainage, dressing clean, dry, intact.

## 2015-06-30 NOTE — Progress Notes (Signed)
Hypoglycemic Event  CBG: 56  Treatment: D50 IV 25 mL  Symptoms: Drowsy, patient also had sedation with surgery, 72mcg fentanyl per PACU rn  Follow-up CBG: Time:1635 CBG Result:153  Possible Reasons for Event: Inadequate meal intake. Patient NPO for lap chole.   Comments/MD notified: Dr. Hillary Bow paged. MD responded, per MD, continue to monitor patient and blood sugar.     Molly [name], Donald Siva

## 2015-06-30 NOTE — Anesthesia Procedure Notes (Signed)
Procedure Name: Intubation Date/Time: 06/30/2015 12:28 PM Performed by: Lance Coon Pre-anesthesia Checklist: Patient identified, Timeout performed, Emergency Drugs available, Suction available and Patient being monitored Patient Re-evaluated:Patient Re-evaluated prior to inductionOxygen Delivery Method: Circle system utilized Preoxygenation: Pre-oxygenation with 100% oxygen Intubation Type: IV induction Ventilation: Mask ventilation without difficulty Laryngoscope Size: Miller and 2 Grade View: Grade III Tube type: Oral Tube size: 7.0 mm Number of attempts: 3 Airway Equipment and Method: Stylet and Bougie stylet Placement Confirmation: ETT inserted through vocal cords under direct vision,  positive ETCO2,  CO2 detector and breath sounds checked- equal and bilateral Secured at: 21 cm Tube secured with: Tape Dental Injury: Teeth and Oropharynx as per pre-operative assessment  Comments: Molly [name] attempt x2, Josilin MD x1 successful attempt

## 2015-06-30 NOTE — Progress Notes (Signed)
Dr. Linna Caprice aware that pt had apple juice and 1/2 a graham cracker due to CBG of 52 at 0445 this morning.

## 2015-06-30 NOTE — Transfer of Care (Signed)
Immediate Anesthesia Transfer of Care Note  Patient: Molly [name]  Procedure(s) Performed: Procedure(s): LAPAROSCOPIC CHOLECYSTECTOMY WITH INTRAOPERATIVE CHOLANGIOGRAM (N/A)  Patient Location: PACU  Anesthesia Type:General  Level of Consciousness: awake, alert , oriented and patient cooperative  Airway & Oxygen Therapy: Patient Spontanous Breathing and Patient connected to nasal cannula oxygen  Post-op Assessment: Report given to RN, Post -op Vital signs reviewed and stable and Patient moving all extremities X 4  Post vital signs: Reviewed and stable  Last Vitals:  Filed Vitals:   06/30/15 0636 06/30/15 1008  BP: 133/57 169/57  Pulse: 81 72  Temp: 37.1 C 36.9 C  Resp: 18 20    Complications: No apparent anesthesia complications

## 2015-06-30 NOTE — Progress Notes (Signed)
Patient refused snack, stated she wanted apple juice instead. Patient is alert and oriented. Will encourage fluids and snacks. Given apple juice.

## 2015-07-01 ENCOUNTER — Other Ambulatory Visit (HOSPITAL_COMMUNITY): Payer: Medicare Other

## 2015-07-01 ENCOUNTER — Encounter (HOSPITAL_COMMUNITY): Payer: Self-pay | Admitting: Surgery

## 2015-07-01 DIAGNOSIS — I10 Essential (primary) hypertension: Secondary | ICD-10-CM

## 2015-07-01 LAB — URINE CULTURE

## 2015-07-01 LAB — COMPREHENSIVE METABOLIC PANEL
ALT: 63 U/L — AB (ref 14–54)
ANION GAP: 11 (ref 5–15)
AST: 109 U/L — ABNORMAL HIGH (ref 15–41)
Albumin: 1.9 g/dL — ABNORMAL LOW (ref 3.5–5.0)
Alkaline Phosphatase: 115 U/L (ref 38–126)
BUN: 26 mg/dL — ABNORMAL HIGH (ref 6–20)
CHLORIDE: 103 mmol/L (ref 101–111)
CO2: 24 mmol/L (ref 22–32)
Calcium: 7.1 mg/dL — ABNORMAL LOW (ref 8.9–10.3)
Creatinine, Ser: 1.45 mg/dL — ABNORMAL HIGH (ref 0.44–1.00)
GFR, EST AFRICAN AMERICAN: 44 mL/min — AB (ref >60)
GFR, EST NON AFRICAN AMERICAN: 38 mL/min — AB (ref >60)
Glucose, Bld: 189 mg/dL — ABNORMAL HIGH (ref 65–99)
POTASSIUM: 3.4 mmol/L — AB (ref 3.5–5.1)
SODIUM: 138 mmol/L (ref 135–145)
TOTAL PROTEIN: 4.8 g/dL — AB (ref 6.5–8.1)
Total Bilirubin: 0.4 mg/dL (ref 0.3–1.2)

## 2015-07-01 LAB — GLUCOSE, CAPILLARY
GLUCOSE-CAPILLARY: 68 mg/dL (ref 65–99)
GLUCOSE-CAPILLARY: 83 mg/dL (ref 65–99)
GLUCOSE-CAPILLARY: 101 mg/dL — AB (ref 65–99)
GLUCOSE-CAPILLARY: 156 mg/dL — AB (ref 65–99)
GLUCOSE-CAPILLARY: 127 mg/dL — AB (ref 65–99)
Glucose-Capillary: 71 mg/dL (ref 65–99)
Glucose-Capillary: 166 mg/dL — ABNORMAL HIGH (ref 65–99)

## 2015-07-01 LAB — CBC
HCT: 28.7 % — ABNORMAL LOW (ref 36.0–46.0)
Hemoglobin: 9.5 g/dL — ABNORMAL LOW (ref 12.0–15.0)
MCH: 27.7 pg (ref 26.0–34.0)
MCHC: 33.1 g/dL (ref 30.0–36.0)
MCV: 83.7 fL (ref 78.0–100.0)
PLATELETS: 353 10*3/uL (ref 150–400)
RBC: 3.43 MIL/uL — AB (ref 3.87–5.11)
RDW: 13.8 % (ref 11.5–15.5)
WBC: 17.4 10*3/uL — AB (ref 4.0–10.5)

## 2015-07-01 NOTE — Progress Notes (Signed)
CBG check 68. Alert and oriented. Asymptomatic. Administered juice. Recheck 71. Administered another 4oz of regular soda. Recheck 83. Will continue to monitor.

## 2015-07-01 NOTE — Progress Notes (Signed)
1 Day Post-Op  Subjective: POD #1 Sleepy, not requiring a lot of pain medication No nausea or vomiting  Objective: Vital signs in last 24 hours: Temp:  [97.7 F (36.5 C)-98.6 F (37 C)] 98.1 F (36.7 C) (03/16 0530) Pulse Rate:  [72-90] 88 (03/16 0530) Resp:  [15-24] 18 (03/16 0530) BP: (114-186)/(42-99) 137/99 mmHg (03/16 0530) SpO2:  [94 %-100 %] 98 % (03/16 0530) Last BM Date: 06/27/15  Intake/Output from previous day: 03/15 0701 - 03/16 0700 In: 1300 [I.V.:1300] Out: 34 [Urine:1; Stool:3; Blood:30] Intake/Output this shift:    General appearance: cooperative and no distress GI: soft, no RUQ tenderness, minimal incisional tenderness Dressings c/d/i  Lab Results:   Recent Labs  06/29/15 0758  WBC 13.1*  HGB 14.4  HCT 45.0  PLT 504*   BMET  Recent Labs  06/29/15 1922 06/30/15 0512  NA 138 139  K 3.4* 3.5  CL 98* 100*  CO2 30 29  GLUCOSE 129* 99  BUN 39* 33*  CREATININE 1.60* 1.46*  CALCIUM 8.5* 8.0*   PT/INR No results for input(s): LABPROT, INR in the last 72 hours. ABG No results for input(s): PHART, HCO3 in the last 72 hours.  Invalid input(s): PCO2, PO2  Studies/Results: Dg Cholangiogram Operative  06/30/2015  CLINICAL DATA:  Laparoscopic cholecystectomy. EXAM: INTRAOPERATIVE CHOLANGIOGRAM TECHNIQUE: Cholangiographic images from the C-arm fluoroscopic device were submitted for interpretation post-operatively. Please see the procedural report for the amount of contrast and the fluoroscopy time utilized. COMPARISON:  CT 06/21/2015 FINDINGS: No persistent filling defects in the common duct. Intrahepatic ducts are incompletely visualized, appearing decompressed centrally. Contrast passes into the duodenum. : Negative for retained common duct stone. Electronically Signed   By: Lucrezia Europe M.D.   On: 06/30/2015 15:54    Anti-infectives: Anti-infectives    Start     Dose/Rate Route Frequency Ordered Stop   06/29/15 1430  cefTRIAXone (ROCEPHIN) 2 g in  dextrose 5 % 50 mL IVPB    Comments:  Pharmacy may adjust dosing strength / duration / interval for maximal efficacy   2 g 100 mL/hr over 30 Minutes Intravenous Every 24 hours 06/29/15 1428        Assessment/Plan: s/p Procedure(s): LAPAROSCOPIC CHOLECYSTECTOMY WITH INTRAOPERATIVE CHOLANGIOGRAM (N/A) Advance diet as tolerated GB was gangrenous - will need one week total of antibiotics - may switch to Augmentin when patient taking PO's Encourage ambulation Check CBC/ CMET   LOS: 2 days    Tyric Rodeheaver K. 07/01/2015

## 2015-07-01 NOTE — Progress Notes (Signed)
Pt bladder scan for 450 ml.  Ambulated to bed side commode, urinated 113ml .  Post void residual 286 ml.  Will continue to monitor. Cori Razor, RN

## 2015-07-01 NOTE — Progress Notes (Signed)
Inpatient Diabetes Program Recommendations  AACE/ADA: New Consensus Statement on Inpatient Glycemic Control (2015)  Target Ranges:  Prepandial:   less than 140 mg/dL      Peak postprandial:   less than 180 mg/dL (1-2 hours)      Critically ill patients:  140 - 180 mg/dL   Spoke with patient in relation to her A1c levels 10.9% and the patient being admitted for DKA. Patient very sleepy in the bed did not open eyes but was responding to questions appropriately. Patient did not take insulin due to being sick before admission. Reviewed sick day guidelines with insulin for the future also attached sick day guidelines to patient AVS in exit care. Patient sees her PCP for glucose control. Told patient to recheck her A1c by her PCP within 3-6 months to see if her glucose levels are decreasing toward A1c goal of 7%. DKA exacerbated by gallbladder.   Thanks,  Tama Headings RN, MSN, [facility] Inpatient Diabetes Coordinator Team Pager [phone] (8a-5p)

## 2015-07-01 NOTE — Progress Notes (Signed)
[facility] PROGRESS NOTE  Molly [name] [id number] DOB: Jan 25, 1951 DOA: 06/29/2015 PCP: Rogers Blocker, MD  Assessment/Plan: Principal Problem:   DKA (diabetic ketoacidoses) ([place]) - Continue current medical management - DKA resolved  Acute blood loss anemia - Most likely from recent operation. Will reassess next am.  Hypokalemia - Most likely from poor oral intake. Suspect resolution with improvement in oral intake  Active Problems:   HTN (hypertension) - Mildly elevated. Relatively well controlled    AKI (acute kidney injury) ([place]) - improving and most likely due to poor oral intake.    Abdominal aortic atherosclerosis with stenosis    Dehydration with hyponatremia - Most likely due to poor oral intake. Resolved with IVF    Leukocytosis - Most likely due to cholecystitis - Continue Rocephin for now. Leukocytosis may be secondary to cholecystitis and recently due to cholecystectomy    Cholecystitis -Gen. surgery on board and planning laparoscopic cholecystectomy with intraoperative cholangiogram  Code Status: full Family Communication: None at bedside Disposition Plan: pending improvement in condition   Consultants:  General surgery  Procedures:  none  Antibiotics:  Rocephin   HPI/Subjective: Pt has no new complaints   Objective: Filed Vitals:   07/01/15 1014 07/01/15 1421  BP: 122/64 115/61  Pulse: 94 91  Temp: 98.5 F (36.9 C) 98.4 F (36.9 C)  Resp: 16 16    Intake/Output Summary (Last 24 hours) at 07/01/15 1618 Last data filed at 06/30/15 1843  Gross per 24 hour  Intake      0 ml  Output      2 ml  Net     -2 ml   Filed Weights   06/29/15 0747 06/29/15 2331  Weight: 70.308 kg (155 lb) 69.128 kg (152 lb 6.4 oz)    Exam:   General:  Patient in no acute distress alert and awake  Cardiovascular: S1 and S2 within normal limits, no rubs  Respiratory: No increased work of breathing, no wheezes, equal chest rise  Abdomen:  Nondistended, no guarding  Musculoskeletal: No cyanosis or clubbing  Data Reviewed: Basic Metabolic Panel:  Recent Labs Lab 06/29/15 1221 06/29/15 1530 06/29/15 1922 06/29/15 1939 06/30/15 0512 07/01/15 0717  NA 135 135 138  --  139 138  K 3.4* 3.5 3.4*  --  3.5 3.4*  CL 93* 97* 98*  --  100* 103  CO2 28 27 30   --  29 24  GLUCOSE 538* 433* 129*  --  99 189*  BUN 44* 42* 39*  --  33* 26*  CREATININE 1.76* 1.70* 1.60*  --  1.46* 1.45*  CALCIUM 8.4* 8.3* 8.5*  --  8.0* 7.1*  MG 2.1  --   --   --   --   --   PHOS 1.7*  --   --  1.1*  --   --    Liver Function Tests:  Recent Labs Lab 06/29/15 0758 06/30/15 0512 07/01/15 0717  AST 29 32 109*  ALT 25 23 63*  ALKPHOS 134* 104 115  BILITOT 0.7 0.5 0.4  PROT 7.3 6.1* 4.8*  ALBUMIN 2.9* 2.2* 1.9*    Recent Labs Lab 06/29/15 0758 06/30/15 0512  LIPASE 59* 27   No results for input(s): AMMONIA in the last 168 hours. CBC:  Recent Labs Lab 06/29/15 0758 07/01/15 0717  WBC 13.1* 17.4*  HGB 14.4 9.5*  HCT 45.0 28.7*  MCV 83.6 83.7  PLT 504* 353   Cardiac Enzymes: No results for input(s): CKTOTAL, CKMB,  CKMBINDEX, TROPONINI in the last 168 hours. BNP (last 3 results) No results for input(s): BNP in the last 8760 hours.  ProBNP (last 3 results) No results for input(s): PROBNP in the last 8760 hours.  CBG:  Recent Labs Lab 07/01/15 0212 07/01/15 0308 07/01/15 0342 07/01/15 0658 07/01/15 1133  GLUCAP 68 71 83 166* 101*    Recent Results (from the past 240 hour(s))  Urine culture     Status: None   Collection Time: 06/29/15  4:36 PM  Result Value Ref Range Status   Specimen Description URINE, CLEAN CATCH  Final   Special Requests NONE  Final   Culture MULTIPLE SPECIES PRESENT, SUGGEST RECOLLECTION  Final   Report Status 07/01/2015 FINAL  Final  Surgical pcr screen     Status: None   Collection Time: 06/30/15 10:17 AM  Result Value Ref Range Status   MRSA, PCR NEGATIVE NEGATIVE Final    Staphylococcus aureus NEGATIVE NEGATIVE Final    Comment:        The Xpert SA Assay (FDA approved for NASAL specimens in patients over 53 years of age), is one component of a comprehensive surveillance program.  Test performance has been validated by [facility] for patients greater than or equal to 68 year old. It is not intended to diagnose infection nor to guide or monitor treatment.      Studies: Dg Cholangiogram Operative  06/30/2015  CLINICAL DATA:  Laparoscopic cholecystectomy. EXAM: INTRAOPERATIVE CHOLANGIOGRAM TECHNIQUE: Cholangiographic images from the C-arm fluoroscopic device were submitted for interpretation post-operatively. Please see the procedural report for the amount of contrast and the fluoroscopy time utilized. COMPARISON:  CT 06/21/2015 FINDINGS: No persistent filling defects in the common duct. Intrahepatic ducts are incompletely visualized, appearing decompressed centrally. Contrast passes into the duodenum. : Negative for retained common duct stone. Electronically Signed   By: Lucrezia Europe M.D.   On: 06/30/2015 15:54    Scheduled Meds: . cefTRIAXone (ROCEPHIN)  IV  2 g Intravenous Q24H  . dextrose  25 mL Intravenous Once  . insulin aspart  0-15 Units Subcutaneous TID WC  . insulin aspart  0-5 Units Subcutaneous QHS  . insulin detemir  32 Units Subcutaneous QHS  . potassium phosphate IVPB (mmol)  30 mmol Intravenous Once   Continuous Infusions: . sodium chloride 100 mL/hr at 07/01/15 0508  . insulin (NOVOLIN-R) infusion Stopped (06/30/15 0334)  . lactated ringers Stopped (06/30/15 1430)    Time spent: > 35 minutes  Velvet Bathe  [facility] Pager [phone]. If 7PM-7AM, please contact night-coverage at [email or web address], password [email or web address] 07/01/2015, 4:18 PM  LOS: 2 days

## 2015-07-01 NOTE — Progress Notes (Signed)
Hasn't voided overnight. Bladder scan 359ml. Refused getting out of bed to try to void on the commode and states, "I don't have the energy, can I try later." Refused In and out cath.

## 2015-07-02 DIAGNOSIS — E871 Hypo-osmolality and hyponatremia: Secondary | ICD-10-CM

## 2015-07-02 DIAGNOSIS — E1310 Other specified diabetes mellitus with ketoacidosis without coma: Secondary | ICD-10-CM

## 2015-07-02 LAB — GLUCOSE, CAPILLARY
GLUCOSE-CAPILLARY: 119 mg/dL — AB (ref 65–99)
GLUCOSE-CAPILLARY: 151 mg/dL — AB (ref 65–99)
Glucose-Capillary: 128 mg/dL — ABNORMAL HIGH (ref 65–99)
Glucose-Capillary: 191 mg/dL — ABNORMAL HIGH (ref 65–99)
Glucose-Capillary: 131 mg/dL — ABNORMAL HIGH (ref 65–99)

## 2015-07-02 LAB — CBC
HCT: 30 % — ABNORMAL LOW (ref 36.0–46.0)
HEMOGLOBIN: 9.7 g/dL — AB (ref 12.0–15.0)
MCH: 27.5 pg (ref 26.0–34.0)
MCHC: 32.3 g/dL (ref 30.0–36.0)
MCV: 85 fL (ref 78.0–100.0)
PLATELETS: 297 10*3/uL (ref 150–400)
RBC: 3.53 MIL/uL — AB (ref 3.87–5.11)
RDW: 14.3 % (ref 11.5–15.5)
WBC: 16.4 10*3/uL — AB (ref 4.0–10.5)

## 2015-07-02 LAB — BASIC METABOLIC PANEL
ANION GAP: 12 (ref 5–15)
BUN: 24 mg/dL — ABNORMAL HIGH (ref 6–20)
CO2: 20 mmol/L — ABNORMAL LOW (ref 22–32)
Calcium: 7.5 mg/dL — ABNORMAL LOW (ref 8.9–10.3)
Chloride: 108 mmol/L (ref 101–111)
Creatinine, Ser: 1.56 mg/dL — ABNORMAL HIGH (ref 0.44–1.00)
GFR calc Af Amer: 40 mL/min — ABNORMAL LOW (ref >60)
GFR, EST NON AFRICAN AMERICAN: 35 mL/min — AB (ref >60)
Glucose, Bld: 138 mg/dL — ABNORMAL HIGH (ref 65–99)
POTASSIUM: 3.6 mmol/L (ref 3.5–5.1)
SODIUM: 140 mmol/L (ref 135–145)

## 2015-07-02 NOTE — Care Management Note (Signed)
Case Management Note  Patient Details  Name: Molly [name] MRN: [id number] Date of Birth: Dec 29, 1950  Subjective/Objective:                    Action/Plan: Plan is to discharge home when medically stable. CM continuing to follow for d/c needs.   Expected Discharge Date:  07/03/15               Expected Discharge Plan:     In-House Referral:     Discharge planning Services     Post Acute Care Choice:    Choice offered to:     DME Arranged:    DME Agency:     HH Arranged:    HH Agency:     Status of Service:  In process, will continue to follow  Medicare Important Message Given:  Yes Date Medicare IM Given:    Medicare IM give by:    Date Additional Medicare IM Given:    Additional Medicare Important Message give by:     If discussed at [place] of Stay Meetings, dates discussed:    Additional Comments:  [name], RN 07/02/2015, 3:45 PM

## 2015-07-02 NOTE — Progress Notes (Signed)
2 Days Post-Op  Subjective: Patient resting comfortably No nausea or vomiting Minimal discomfort Having BM's   Objective: Vital signs in last 24 hours: Temp:  [97.7 F (36.5 C)-98.6 F (37 C)] 98.6 F (37 C) (03/17 0550) Pulse Rate:  [77-94] 77 (03/17 0550) Resp:  [16-20] 20 (03/17 0550) BP: (115-172)/(54-68) 172/68 mmHg (03/17 0550) SpO2:  [86 %-100 %] 100 % (03/17 0550) Weight:  [70.308 kg (155 lb)] 70.308 kg (155 lb) (03/16 1724) Last BM Date: 06/30/15  Intake/Output from previous day: 03/16 0701 - 03/17 0700 In: -  Out: 600 [Urine:600] Intake/Output this shift:    General appearance: alert, cooperative and no distress GI: soft, minimal tenderness around incisions Dressings c/d/i  Lab Results:   Recent Labs  07/01/15 0717  WBC 17.4*  HGB 9.5*  HCT 28.7*  PLT 353   BMET  Recent Labs  06/30/15 0512 07/01/15 0717  NA 139 138  K 3.5 3.4*  CL 100* 103  CO2 29 24  GLUCOSE 99 189*  BUN 33* 26*  CREATININE 1.46* 1.45*  CALCIUM 8.0* 7.1*   PT/INR No results for input(s): LABPROT, INR in the last 72 hours. ABG No results for input(s): PHART, HCO3 in the last 72 hours.  Invalid input(s): PCO2, PO2  Studies/Results: Dg Cholangiogram Operative  06/30/2015  CLINICAL DATA:  Laparoscopic cholecystectomy. EXAM: INTRAOPERATIVE CHOLANGIOGRAM TECHNIQUE: Cholangiographic images from the C-arm fluoroscopic device were submitted for interpretation post-operatively. Please see the procedural report for the amount of contrast and the fluoroscopy time utilized. COMPARISON:  CT 06/21/2015 FINDINGS: No persistent filling defects in the common duct. Intrahepatic ducts are incompletely visualized, appearing decompressed centrally. Contrast passes into the duodenum. : Negative for retained common duct stone. Electronically Signed   By: Lucrezia Europe M.D.   On: 06/30/2015 15:54    Anti-infectives: Anti-infectives    Start     Dose/Rate Route Frequency Ordered Stop   06/29/15  1430  cefTRIAXone (ROCEPHIN) 2 g in dextrose 5 % 50 mL IVPB    Comments:  Pharmacy may adjust dosing strength / duration / interval for maximal efficacy   2 g 100 mL/hr over 30 Minutes Intravenous Every 24 hours 06/29/15 1428        Assessment/Plan: s/p Procedure(s): LAPAROSCOPIC CHOLECYSTECTOMY WITH INTRAOPERATIVE CHOLANGIOGRAM (N/A) OK for discharge from surgical standpoint.  Medical issues per primary team Discharge instructions in the patient's Epic chart. May remove dressings and shower.  Please call us back for any further problems.   LOS: 3 days    Betsy Rosello K. 07/02/2015

## 2015-07-02 NOTE — Progress Notes (Signed)
Bladder scan pt of 472ml. Encouraged and assisted to [facility] and voided 529ml amber urine. Encouraged use of IS.

## 2015-07-02 NOTE — Progress Notes (Signed)
[facility] PROGRESS NOTE  Molly [name] [id number] DOB: 03-19-1951 DOA: 06/29/2015 PCP: Rogers Blocker, MD  Assessment/Plan: Principal Problem:   DKA (diabetic ketoacidoses) ([place]) - Continue current medical management - DKA resolved  Acute blood loss anemia - Most likely from recent operation. Will reassess next am.  Hypokalemia - Resolved with improvement in oral intake  Active Problems:   HTN (hypertension) - Mildly elevated. Relatively well controlled    AKI (acute kidney injury) ([place]) - stable currently    Abdominal aortic atherosclerosis with stenosis    Dehydration with hyponatremia - Most likely due to poor oral intake. Resolved with IVF    Leukocytosis - Most likely due to cholecystitis, trending down slowly - Continue Rocephin for now. Leukocytosis may be secondary to cholecystitis and recently due to cholecystectomy    Cholecystitis -Gen. surgery on board and planning laparoscopic cholecystectomy with intraoperative cholangiogram  Code Status: full Family Communication: None at bedside Disposition Plan: pending improvement in WBC levels and disposition planning by PT   Consultants:  General surgery  Procedures:  none  Antibiotics:  Rocephin   HPI/Subjective: Pt has no new complaints she is looking forward to discharge. Nursing reports patient is too weak to stand up by herself  Objective: Filed Vitals:   07/02/15 0550 07/02/15 1008  BP: 172/68 137/69  Pulse: 77 81  Temp: 98.6 F (37 C) 98.5 F (36.9 C)  Resp: 20 20    Intake/Output Summary (Last 24 hours) at 07/02/15 1535 Last data filed at 07/02/15 0302  Gross per 24 hour  Intake      0 ml  Output    600 ml  Net   -600 ml   Filed Weights   06/29/15 0747 06/29/15 2331 07/01/15 1724  Weight: 70.308 kg (155 lb) 69.128 kg (152 lb 6.4 oz) 70.308 kg (155 lb)    Exam:   General:  Patient in no acute distress alert and awake  Cardiovascular: S1 and S2 within normal limits,  no rubs  Respiratory: No increased work of breathing, no wheezes, equal chest rise  Abdomen: Nondistended, no guarding  Musculoskeletal: No cyanosis or clubbing  Data Reviewed: Basic Metabolic Panel:  Recent Labs Lab 06/29/15 1221 06/29/15 1530 06/29/15 1922 06/29/15 1939 06/30/15 0512 07/01/15 0717 07/02/15 0844  NA 135 135 138  --  139 138 140  K 3.4* 3.5 3.4*  --  3.5 3.4* 3.6  CL 93* 97* 98*  --  100* 103 108  CO2 28 27 30   --  29 24 20*  GLUCOSE 538* 433* 129*  --  99 189* 138*  BUN 44* 42* 39*  --  33* 26* 24*  CREATININE 1.76* 1.70* 1.60*  --  1.46* 1.45* 1.56*  CALCIUM 8.4* 8.3* 8.5*  --  8.0* 7.1* 7.5*  MG 2.1  --   --   --   --   --   --   PHOS 1.7*  --   --  1.1*  --   --   --    Liver Function Tests:  Recent Labs Lab 06/29/15 0758 06/30/15 0512 07/01/15 0717  AST 29 32 109*  ALT 25 23 63*  ALKPHOS 134* 104 115  BILITOT 0.7 0.5 0.4  PROT 7.3 6.1* 4.8*  ALBUMIN 2.9* 2.2* 1.9*    Recent Labs Lab 06/29/15 0758 06/30/15 0512  LIPASE 59* 27   No results for input(s): AMMONIA in the last 168 hours. CBC:  Recent Labs Lab 06/29/15 0758 07/01/15 0717 07/02/15  0844  WBC 13.1* 17.4* 16.4*  HGB 14.4 9.5* 9.7*  HCT 45.0 28.7* 30.0*  MCV 83.6 83.7 85.0  PLT 504* 353 297   Cardiac Enzymes: No results for input(s): CKTOTAL, CKMB, CKMBINDEX, TROPONINI in the last 168 hours. BNP (last 3 results) No results for input(s): BNP in the last 8760 hours.  ProBNP (last 3 results) No results for input(s): PROBNP in the last 8760 hours.  CBG:  Recent Labs Lab 07/01/15 1643 07/01/15 2234 07/02/15 0230 07/02/15 0638 07/02/15 1128  GLUCAP 156* 127* 119* 128* 191*    Recent Results (from the past 240 hour(s))  Urine culture     Status: None   Collection Time: 06/29/15  4:36 PM  Result Value Ref Range Status   Specimen Description URINE, CLEAN CATCH  Final   Special Requests NONE  Final   Culture MULTIPLE SPECIES PRESENT, SUGGEST RECOLLECTION   Final   Report Status 07/01/2015 FINAL  Final  Surgical pcr screen     Status: None   Collection Time: 06/30/15 10:17 AM  Result Value Ref Range Status   MRSA, PCR NEGATIVE NEGATIVE Final   Staphylococcus aureus NEGATIVE NEGATIVE Final    Comment:        The Xpert SA Assay (FDA approved for NASAL specimens in patients over 84 years of age), is one component of a comprehensive surveillance program.  Test performance has been validated by [facility] for patients greater than or equal to 41 year old. It is not intended to diagnose infection nor to guide or monitor treatment.      Studies: No results found.  Scheduled Meds: . cefTRIAXone (ROCEPHIN)  IV  2 g Intravenous Q24H  . dextrose  25 mL Intravenous Once  . insulin aspart  0-15 Units Subcutaneous TID WC  . insulin aspart  0-5 Units Subcutaneous QHS  . insulin detemir  32 Units Subcutaneous QHS  . potassium phosphate IVPB (mmol)  30 mmol Intravenous Once   Continuous Infusions: . sodium chloride 100 mL/hr at 07/01/15 0508  . insulin (NOVOLIN-R) infusion Stopped (06/30/15 0334)  . lactated ringers Stopped (06/30/15 1430)    Time spent: > 35 minutes  Velvet Bathe  [facility] Pager [phone]. If 7PM-7AM, please contact night-coverage at [email or web address], password [facility] 07/02/2015, 3:35 PM  LOS: 3 days

## 2015-07-02 NOTE — Care Management Important Message (Signed)
Important Message  Patient Details  Name: Molly [name] MRN: [id number] Date of Birth: 09-12-1950   Medicare Important Message Given:  Yes    Barb Merino Harlin Mazzoni 07/02/2015, 2:57 PM

## 2015-07-02 NOTE — Progress Notes (Signed)
RN tried to get patient out of bed, but she is too weak to even sit up. MD paged to see if patient can have order for PT.

## 2015-07-02 NOTE — Discharge Instructions (Signed)

## 2015-07-03 LAB — GLUCOSE, CAPILLARY
GLUCOSE-CAPILLARY: 87 mg/dL (ref 65–99)
GLUCOSE-CAPILLARY: 91 mg/dL (ref 65–99)
Glucose-Capillary: 46 mg/dL — ABNORMAL LOW (ref 65–99)
Glucose-Capillary: 62 mg/dL — ABNORMAL LOW (ref 65–99)
Glucose-Capillary: 94 mg/dL (ref 65–99)

## 2015-07-03 LAB — CBC
HEMATOCRIT: 27 % — AB (ref 36.0–46.0)
Hemoglobin: 8.5 g/dL — ABNORMAL LOW (ref 12.0–15.0)
MCH: 26.4 pg (ref 26.0–34.0)
MCHC: 31.5 g/dL (ref 30.0–36.0)
MCV: 83.9 fL (ref 78.0–100.0)
PLATELETS: 321 10*3/uL (ref 150–400)
RBC: 3.22 MIL/uL — AB (ref 3.87–5.11)
RDW: 14.1 % (ref 11.5–15.5)
WBC: 15 10*3/uL — AB (ref 4.0–10.5)

## 2015-07-03 NOTE — Evaluation (Signed)
Physical Therapy Evaluation Patient Details Name: Molly [name] MRN: [id number] DOB: 04/14/1951 Today's Date: 07/03/2015   History of Present Illness  Pt is a 65 y.o. female admitted for DKA and cholecystitis. PMH consists of HTN, cholelithiasis, and IDDM. She underwent cholecystectomy 06-30-15.  Clinical Impression  Pt admitted with above diagnosis. Pt currently with functional limitations due to the deficits listed below (see PT Problem List). On eval, pt required mod assist for bed mobility, mod assist transfers, and min assist ambulation with RW 5 feet. Gait distance significantly limited by fatigue, weakness, and dizziness. She will need to be able to ascend 2 steps to enter her house. Pt will benefit from skilled PT to increase their independence and safety with mobility to allow discharge to the venue listed below.       Follow Up Recommendations Home health PT;Supervision/Assistance - 24 hour    Equipment Recommendations  None recommended by PT    Recommendations for Other Services       Precautions / Restrictions Precautions Precautions: Fall      Mobility  Bed Mobility Overal bed mobility: Needs Assistance Bed Mobility: Supine to Sit     Supine to sit: Mod assist     General bed mobility comments: cues for sequencing  Transfers Overall transfer level: Needs assistance Equipment used: Rolling walker (2 wheeled) Transfers: Sit to/from [facility] Sit to Stand: Mod assist Stand pivot transfers: Min assist       General transfer comment: cues for sequencing  Ambulation/Gait Ambulation/Gait assistance: Min assist Ambulation Distance (Feet): 5 Feet Assistive device: Rolling walker (2 wheeled) Gait Pattern/deviations: Step-through pattern;Decreased stride length Gait velocity: decreased Gait velocity interpretation: Below normal speed for age/gender General Gait Details: distance limited by fatigue and dizziness  Stairs             Wheelchair Mobility    Modified Rankin (Stroke Patients Only)       Balance Overall balance assessment: Needs assistance Sitting-balance support: Feet supported;No upper extremity supported Sitting balance-Leahy Scale: Good     Standing balance support: Bilateral upper extremity supported;During functional activity Standing balance-Leahy Scale: Fair Standing balance comment: RW needed for stance.                             Pertinent Vitals/Pain Pain Assessment: 0-10 Pain Score: 4  Pain Location: abdomen Pain Descriptors / Indicators: Sore Pain Intervention(s): Monitored during session;Repositioned    Home Living Family/patient expects to be discharged to:: Private residence Living Arrangements: Spouse/significant other Available Help at Discharge: Family;Available 24 hours/day Type of Home: House Home Access: Stairs to enter Entrance Stairs-Rails: Psychiatric nurse of Steps: 2 Home Layout: One level Home Equipment: Walker - 2 wheels;Cane - single point;Bedside commode;Shower seat;Grab bars - tub/shower      Prior Function Level of Independence: Independent               Hand Dominance        Extremity/Trunk Assessment   Upper Extremity Assessment: Generalized weakness           Lower Extremity Assessment: Generalized weakness      Cervical / Trunk Assessment: Normal  Communication   Communication: No difficulties  Cognition Arousal/Alertness: Awake/alert Behavior During Therapy: WFL for tasks assessed/performed Overall Cognitive Status: Within Functional Limits for tasks assessed                      General Comments  Exercises        Assessment/Plan    PT Assessment Patient needs continued PT services  PT Diagnosis Generalized weakness;Difficulty walking;Acute pain   PT Problem List Decreased strength;Decreased activity tolerance;Decreased balance;Decreased mobility;Pain;Decreased safety  awareness  PT Treatment Interventions DME instruction;Gait training;Stair training;Functional mobility training;Therapeutic activities;Therapeutic exercise;Patient/family education;Balance training   PT Goals (Current goals can be found in the Care Plan section) Acute Rehab PT Goals Patient Stated Goal: home to her dog PT Goal Formulation: With patient/family Time For Goal Achievement: 07/17/15 Potential to Achieve Goals: Good    Frequency Min 3X/week   Barriers to discharge        Co-evaluation               End of Session Equipment Utilized During Treatment: Gait belt Activity Tolerance: Patient limited by fatigue Patient left: in chair;with family/visitor present;with call bell/phone within reach;with chair alarm set Nurse Communication: Mobility status         Time: 1131-1201 PT Time Calculation (min) (ACUTE ONLY): 30 min   Charges:   PT Evaluation $PT Eval Moderate Complexity: 1 Procedure PT Treatments $Therapeutic Activity: 8-22 mins   PT G Codes:        Lorriane Shire 07/03/2015, 1:05 PM

## 2015-07-03 NOTE — Progress Notes (Signed)
[facility] PROGRESS NOTE  Molly [name] [id number] DOB: [birth year] DOA: 06/29/2015 PCP: Rogers Blocker, MD  Assessment/Plan: Principal Problem:   DKA (diabetic ketoacidoses) ([place]) - Continue current medical management - DKA resolved  Acute blood loss anemia - Most likely from recent operation. - stable  Hypokalemia - Resolved with improvement in oral intake  Active Problems:   HTN (hypertension) - Mildly elevated. Relatively well controlled    AKI (acute kidney injury) ([place]) - stable currently    Abdominal aortic atherosclerosis with stenosis    Dehydration with hyponatremia - Most likely due to poor oral intake. Resolved with IVF    Leukocytosis - Most likely due to cholecystitis, trending down slowly - Will d/c rocephin since leukocytosis felt to be most likely due to stress reaction from cholecystitis.     Cholecystitis -Gen. surgery on board and planning laparoscopic cholecystectomy with intraoperative cholangiogram  Nausea - Will treat supportively  Code Status: full Family Communication: None at bedside Disposition Plan: pending improvement in WBC levels and disposition planning by PT   Consultants:  General surgery  Procedures:  none  Antibiotics:  Rocephin   HPI/Subjective: No new complaints. No acute issues reported overnight.  Objective: Filed Vitals:   07/03/15 0700 07/03/15 1024  BP: 172/63 155/61  Pulse: 77 76  Temp: 98.1 F (36.7 C) 98.7 F (37.1 C)  Resp: 20 20    Intake/Output Summary (Last 24 hours) at 07/03/15 1321 Last data filed at 07/03/15 0600  Gross per 24 hour  Intake    720 ml  Output    350 ml  Net    370 ml   Filed Weights   06/29/15 0747 06/29/15 2331 07/01/15 1724  Weight: 70.308 kg (155 lb) 69.128 kg (152 lb 6.4 oz) 70.308 kg (155 lb)    Exam:   General:  Patient in no acute distress alert and awake  Cardiovascular: S1 and S2 within normal limits, no rubs  Respiratory: No increased work of  breathing, no wheezes, equal chest rise  Abdomen: Nondistended, no guarding  Musculoskeletal: No cyanosis or clubbing  Data Reviewed: Basic Metabolic Panel:  Recent Labs Lab 06/29/15 1221 06/29/15 1530 06/29/15 1922 06/29/15 1939 06/30/15 0512 07/01/15 0717 07/02/15 0844  NA 135 135 138  --  139 138 140  K 3.4* 3.5 3.4*  --  3.5 3.4* 3.6  CL 93* 97* 98*  --  100* 103 108  CO2 28 27 30   --  29 24 20*  GLUCOSE 538* 433* 129*  --  99 189* 138*  BUN 44* 42* 39*  --  33* 26* 24*  CREATININE 1.76* 1.70* 1.60*  --  1.46* 1.45* 1.56*  CALCIUM 8.4* 8.3* 8.5*  --  8.0* 7.1* 7.5*  MG 2.1  --   --   --   --   --   --   PHOS 1.7*  --   --  1.1*  --   --   --    Liver Function Tests:  Recent Labs Lab 06/29/15 0758 06/30/15 0512 07/01/15 0717  AST 29 32 109*  ALT 25 23 63*  ALKPHOS 134* 104 115  BILITOT 0.7 0.5 0.4  PROT 7.3 6.1* 4.8*  ALBUMIN 2.9* 2.2* 1.9*    Recent Labs Lab 06/29/15 0758 06/30/15 0512  LIPASE 59* 27   No results for input(s): AMMONIA in the last 168 hours. CBC:  Recent Labs Lab 06/29/15 0758 07/01/15 0717 07/02/15 0844 07/03/15 0303  WBC 13.1* 17.4* 16.4* 15.0*  HGB 14.4 9.5* 9.7* 8.5*  HCT 45.0 28.7* 30.0* 27.0*  MCV 83.6 83.7 85.0 83.9  PLT 504* 353 297 321   Cardiac Enzymes: No results for input(s): CKTOTAL, CKMB, CKMBINDEX, TROPONINI in the last 168 hours. BNP (last 3 results) No results for input(s): BNP in the last 8760 hours.  ProBNP (last 3 results) No results for input(s): PROBNP in the last 8760 hours.  CBG:  Recent Labs Lab 07/02/15 1128 07/02/15 1650 07/02/15 2300 07/03/15 0655 07/03/15 1140  GLUCAP 191* 151* 131* 94 87    Recent Results (from the past 240 hour(s))  Urine culture     Status: None   Collection Time: 06/29/15  4:36 PM  Result Value Ref Range Status   Specimen Description URINE, CLEAN CATCH  Final   Special Requests NONE  Final   Culture MULTIPLE SPECIES PRESENT, SUGGEST RECOLLECTION  Final    Report Status 07/01/2015 FINAL  Final  Surgical pcr screen     Status: None   Collection Time: 06/30/15 10:17 AM  Result Value Ref Range Status   MRSA, PCR NEGATIVE NEGATIVE Final   Staphylococcus aureus NEGATIVE NEGATIVE Final    Comment:        The Xpert SA Assay (FDA approved for NASAL specimens in patients over 9 years of age), is one component of a comprehensive surveillance program.  Test performance has been validated by [facility] for patients greater than or equal to 67 year old. It is not intended to diagnose infection nor to guide or monitor treatment.      Studies: No results found.  Scheduled Meds: . cefTRIAXone (ROCEPHIN)  IV  2 g Intravenous Q24H  . dextrose  25 mL Intravenous Once  . insulin aspart  0-15 Units Subcutaneous TID WC  . insulin aspart  0-5 Units Subcutaneous QHS  . insulin detemir  32 Units Subcutaneous QHS  . potassium phosphate IVPB (mmol)  30 mmol Intravenous Once   Continuous Infusions: . sodium chloride 100 mL/hr at 07/02/15 2352  . insulin (NOVOLIN-R) infusion Stopped (06/30/15 0334)  . lactated ringers Stopped (06/30/15 1430)    Time spent: > 35 minutes  Velvet Bathe  [facility] Pager [phone]. If 7PM-7AM, please contact night-coverage at [email or web address], password [facility] 07/03/2015, 1:21 PM  LOS: 4 days

## 2015-07-04 DIAGNOSIS — E162 Hypoglycemia, unspecified: Secondary | ICD-10-CM

## 2015-07-04 LAB — GLUCOSE, CAPILLARY
GLUCOSE-CAPILLARY: 47 mg/dL — AB (ref 65–99)
GLUCOSE-CAPILLARY: 55 mg/dL — AB (ref 65–99)
GLUCOSE-CAPILLARY: 59 mg/dL — AB (ref 65–99)
GLUCOSE-CAPILLARY: 59 mg/dL — AB (ref 65–99)
GLUCOSE-CAPILLARY: 86 mg/dL (ref 65–99)
GLUCOSE-CAPILLARY: 98 mg/dL (ref 65–99)
GLUCOSE-CAPILLARY: 98 mg/dL (ref 65–99)
Glucose-Capillary: 39 mg/dL — CL (ref 65–99)
Glucose-Capillary: 46 mg/dL — ABNORMAL LOW (ref 65–99)
Glucose-Capillary: 58 mg/dL — ABNORMAL LOW (ref 65–99)
Glucose-Capillary: 82 mg/dL (ref 65–99)
Glucose-Capillary: 131 mg/dL — ABNORMAL HIGH (ref 65–99)
Glucose-Capillary: 67 mg/dL (ref 65–99)
Glucose-Capillary: 146 mg/dL — ABNORMAL HIGH (ref 65–99)

## 2015-07-04 LAB — CBC
HEMATOCRIT: 28.3 % — AB (ref 36.0–46.0)
Hemoglobin: 8.9 g/dL — ABNORMAL LOW (ref 12.0–15.0)
MCH: 26.3 pg (ref 26.0–34.0)
MCHC: 31.4 g/dL (ref 30.0–36.0)
MCV: 83.7 fL (ref 78.0–100.0)
Platelets: 387 10*3/uL (ref 150–400)
RBC: 3.38 MIL/uL — AB (ref 3.87–5.11)
RDW: 14.1 % (ref 11.5–15.5)
WBC: 15 10*3/uL — AB (ref 4.0–10.5)

## 2015-07-04 MED ORDER — AMLODIPINE BESYLATE 10 MG PO TABS
10.0000 mg | ORAL_TABLET | Freq: Every day | ORAL | Status: DC
  Administered 2015-07-04 – 2015-07-06 (×3): 10 mg via ORAL
  Filled 2015-07-04 (×3): qty 1

## 2015-07-04 MED ORDER — INSULIN DETEMIR 100 UNIT/ML ~~LOC~~ SOLN
16.0000 [IU] | Freq: Every day | SUBCUTANEOUS | Status: DC
  Filled 2015-07-04 (×2): qty 0.16

## 2015-07-04 MED ORDER — AMOXICILLIN-POT CLAVULANATE 875-125 MG PO TABS
1.0000 | ORAL_TABLET | Freq: Two times a day (BID) | ORAL | Status: DC
  Administered 2015-07-04 – 2015-07-06 (×4): 1 via ORAL
  Filled 2015-07-04 (×4): qty 1

## 2015-07-04 NOTE — Progress Notes (Signed)
Hypoglycemic Event  CBG: 62  Treatment: 12 Oz apple juice   Symptoms: None   Follow-up CBG: Time: 0056 CBG Result: 98  Possible Reasons for Event: significantly decreased PO intake   Comments/MD notified: No    Molly [name] N

## 2015-07-04 NOTE — Progress Notes (Signed)
Hypoglycemic Event  CBG: 67 Treatment: 15 GM carbohydrate snack  Symptoms: None  Follow-up CBG: Time:1746 CBG Result:55  Possible Reasons for Event: Inadequate meal intake  Comments/MD notified:Hypoglycemic protocol.    Tom-Johnson, Renea Ee

## 2015-07-04 NOTE — Progress Notes (Signed)
Physical Therapy Treatment Patient Details Name: Molly [name] MRN: [id number] DOB: 03-18-1951 Today's Date: 07/04/2015    History of Present Illness Pt is a 65 y.o. female admitted for DKA and cholecystitis. PMH consists of HTN, cholelithiasis, and IDDM. She underwent cholecystectomy 06-30-15.    PT Comments    Pt reports she is feeling better today. No c/o dizziness with stance. Gait distance limited to in room due to fatigue.  Follow Up Recommendations  Home health PT;Supervision/Assistance - 24 hour     Equipment Recommendations  None recommended by PT    Recommendations for Other Services       Precautions / Restrictions Precautions Precautions: Fall    Mobility  Bed Mobility         Supine to sit: Supervision     General bed mobility comments: cues for sequencing  Transfers   Equipment used: Rolling walker (2 wheeled)   Sit to Stand: Min assist Stand pivot transfers: Min guard       General transfer comment: cues for sequencing and hand placement  Ambulation/Gait Ambulation/Gait assistance: Min guard Ambulation Distance (Feet): 15 Feet Assistive device: Rolling walker (2 wheeled) Gait Pattern/deviations: Step-through pattern;Decreased stride length Gait velocity: decreased Gait velocity interpretation: Below normal speed for age/gender General Gait Details: distance limited by fatigue   Stairs            Wheelchair Mobility    Modified Rankin (Stroke Patients Only)       Balance                                    Cognition Arousal/Alertness: Awake/alert Behavior During Therapy: WFL for tasks assessed/performed Overall Cognitive Status: Within Functional Limits for tasks assessed                      Exercises      General Comments        Pertinent Vitals/Pain Pain Assessment: No/denies pain    Home Living                      Prior Function            PT Goals (current goals can now  be found in the care plan section) Acute Rehab PT Goals Patient Stated Goal: home to her dog PT Goal Formulation: With patient/family Time For Goal Achievement: 07/17/15 Potential to Achieve Goals: Good Progress towards PT goals: Progressing toward goals    Frequency  Min 3X/week    PT Plan Current plan remains appropriate    Co-evaluation             End of Session Equipment Utilized During Treatment: Gait belt Activity Tolerance: Patient limited by fatigue Patient left: in chair;with call bell/phone within reach;with chair alarm set     Time: [id number] PT Time Calculation (min) (ACUTE ONLY): 18 min  Charges:  $Gait Training: 8-22 mins                    G Codes:      Lorriane Shire 07/04/2015, 3:55 PM

## 2015-07-04 NOTE — Progress Notes (Signed)
Hypoglycemic Event  CBG:55 Treatment: D50 IV 25 mL  Symptoms: None  Follow-up CBG: Time:1850 CBG Result:98  Possible Reasons for Event: Inadequate meal intake  Comments/MD notified:Hypoglycemic Protocol.    Tom-Johnson, Renea Ee

## 2015-07-04 NOTE — Progress Notes (Signed)
[facility] PROGRESS NOTE  Molly [name] [id number] DOB: 10-Mar-1951 DOA: 06/29/2015 PCP: Rogers Blocker, MD  Assessment/Plan: Principal Problem:   DKA (diabetic ketoacidoses) ([place]) - DKA resolved  Hypoglycemia - Will decrease Long actin insulin regimen from 32 Units to 16 units sQ daily  Acute blood loss anemia - Most likely from recent operation. - stable  Elevated blood pressures -Not well controlled, will add amlodipine  Hypokalemia - Resolved with improvement in oral intake - We'll reassess next a.m.    AKI (acute kidney injury) ([place]) - stable currently - Reassess BMP next a.m.    Abdominal aortic atherosclerosis with stenosis    Dehydration with hyponatremia - Most likely due to poor oral intake. Resolved with IVF    Leukocytosis - Patient had gangrenous gallbladder as such will place on Augmentin    Cholecystitis -Gen. surgery on board and planning laparoscopic cholecystectomy with intraoperative cholangiogram  Nausea - Will treat supportively  Code Status: full Family Communication: None at bedside Disposition Plan: pending improvement in WBC levels and disposition planning by PT   Consultants:  General surgery  Procedures:  none  Antibiotics:  Rocephin   HPI/Subjective: No new complaints. No acute issues reported overnight.  Objective: Filed Vitals:   07/04/15 0942 07/04/15 1345  BP: 153/44 198/67  Pulse: 66 73  Temp: 98.4 F (36.9 C) 98.2 F (36.8 C)  Resp: 18 18   No intake or output data in the 24 hours ending 07/04/15 1554 Filed Weights   06/29/15 0747 06/29/15 2331 07/01/15 1724  Weight: 70.308 kg (155 lb) 69.128 kg (152 lb 6.4 oz) 70.308 kg (155 lb)    Exam:   General:  Patient in no acute distress alert and awake  Cardiovascular: S1 and S2 within normal limits, no rubs  Respiratory: No increased work of breathing, no wheezes, equal chest rise  Abdomen: Nondistended, no guarding  Musculoskeletal: No cyanosis  or clubbing  Data Reviewed: Basic Metabolic Panel:  Recent Labs Lab 06/29/15 1221 06/29/15 1530 06/29/15 1922 06/29/15 1939 06/30/15 0512 07/01/15 0717 07/02/15 0844  NA 135 135 138  --  139 138 140  K 3.4* 3.5 3.4*  --  3.5 3.4* 3.6  CL 93* 97* 98*  --  100* 103 108  CO2 28 27 30   --  29 24 20*  GLUCOSE 538* 433* 129*  --  99 189* 138*  BUN 44* 42* 39*  --  33* 26* 24*  CREATININE 1.76* 1.70* 1.60*  --  1.46* 1.45* 1.56*  CALCIUM 8.4* 8.3* 8.5*  --  8.0* 7.1* 7.5*  MG 2.1  --   --   --   --   --   --   PHOS 1.7*  --   --  1.1*  --   --   --    Liver Function Tests:  Recent Labs Lab 06/29/15 0758 06/30/15 0512 07/01/15 0717  AST 29 32 109*  ALT 25 23 63*  ALKPHOS 134* 104 115  BILITOT 0.7 0.5 0.4  PROT 7.3 6.1* 4.8*  ALBUMIN 2.9* 2.2* 1.9*    Recent Labs Lab 06/29/15 0758 06/30/15 0512  LIPASE 59* 27   No results for input(s): AMMONIA in the last 168 hours. CBC:  Recent Labs Lab 06/29/15 0758 07/01/15 0717 07/02/15 0844 07/03/15 0303 07/04/15 1144  WBC 13.1* 17.4* 16.4* 15.0* 15.0*  HGB 14.4 9.5* 9.7* 8.5* 8.9*  HCT 45.0 28.7* 30.0* 27.0* 28.3*  MCV 83.6 83.7 85.0 83.9 83.7  PLT 504* 353  297 321 387   Cardiac Enzymes: No results for input(s): CKTOTAL, CKMB, CKMBINDEX, TROPONINI in the last 168 hours. BNP (last 3 results) No results for input(s): BNP in the last 8760 hours.  ProBNP (last 3 results) No results for input(s): PROBNP in the last 8760 hours.  CBG:  Recent Labs Lab 07/04/15 0743 07/04/15 0807 07/04/15 0810 07/04/15 0951 07/04/15 1150  GLUCAP 58* 39* 59* 82 131*    Recent Results (from the past 240 hour(s))  Urine culture     Status: None   Collection Time: 06/29/15  4:36 PM  Result Value Ref Range Status   Specimen Description URINE, CLEAN CATCH  Final   Special Requests NONE  Final   Culture MULTIPLE SPECIES PRESENT, SUGGEST RECOLLECTION  Final   Report Status 07/01/2015 FINAL  Final  Surgical pcr screen     Status:  None   Collection Time: 06/30/15 10:17 AM  Result Value Ref Range Status   MRSA, PCR NEGATIVE NEGATIVE Final   Staphylococcus aureus NEGATIVE NEGATIVE Final    Comment:        The Xpert SA Assay (FDA approved for NASAL specimens in patients over 33 years of age), is one component of a comprehensive surveillance program.  Test performance has been validated by [facility] for patients greater than or equal to 64 year old. It is not intended to diagnose infection nor to guide or monitor treatment.      Studies: No results found.  Scheduled Meds: . amLODipine  10 mg Oral Daily  . amoxicillin-clavulanate  1 tablet Oral BID  . dextrose  25 mL Intravenous Once  . insulin aspart  0-15 Units Subcutaneous TID WC  . insulin aspart  0-5 Units Subcutaneous QHS  . insulin detemir  32 Units Subcutaneous QHS  . potassium phosphate IVPB (mmol)  30 mmol Intravenous Once   Continuous Infusions: . sodium chloride 100 mL/hr at 07/02/15 2352  . insulin (NOVOLIN-R) infusion Stopped (06/30/15 0334)  . lactated ringers Stopped (06/30/15 1430)    Time spent: > 35 minutes  Velvet Bathe  [facility] Pager [phone]. If 7PM-7AM, please contact night-coverage at [email or web address], password [facility] 07/04/2015, 3:54 PM  LOS: 5 days

## 2015-07-05 ENCOUNTER — Inpatient Hospital Stay (HOSPITAL_COMMUNITY): Payer: Medicare Other

## 2015-07-05 DIAGNOSIS — D72829 Elevated white blood cell count, unspecified: Secondary | ICD-10-CM

## 2015-07-05 LAB — GLUCOSE, CAPILLARY
GLUCOSE-CAPILLARY: 178 mg/dL — AB (ref 65–99)
Glucose-Capillary: 53 mg/dL — ABNORMAL LOW (ref 65–99)
Glucose-Capillary: 56 mg/dL — ABNORMAL LOW (ref 65–99)
Glucose-Capillary: 146 mg/dL — ABNORMAL HIGH (ref 65–99)
Glucose-Capillary: 170 mg/dL — ABNORMAL HIGH (ref 65–99)
Glucose-Capillary: 180 mg/dL — ABNORMAL HIGH (ref 65–99)

## 2015-07-05 LAB — BASIC METABOLIC PANEL
Anion gap: 8 (ref 5–15)
BUN: 10 mg/dL (ref 6–20)
CALCIUM: 7.6 mg/dL — AB (ref 8.9–10.3)
CO2: 27 mmol/L (ref 22–32)
CREATININE: 0.77 mg/dL (ref 0.44–1.00)
Chloride: 104 mmol/L (ref 101–111)
GFR calc Af Amer: >60 mL/min (ref >60)
GLUCOSE: 136 mg/dL — AB (ref 65–99)
Potassium: 3.1 mmol/L — ABNORMAL LOW (ref 3.5–5.1)
Sodium: 139 mmol/L (ref 135–145)

## 2015-07-05 LAB — CBC
HEMATOCRIT: 28.6 % — AB (ref 36.0–46.0)
Hemoglobin: 8.9 g/dL — ABNORMAL LOW (ref 12.0–15.0)
MCH: 25.9 pg — ABNORMAL LOW (ref 26.0–34.0)
MCHC: 31.1 g/dL (ref 30.0–36.0)
MCV: 83.4 fL (ref 78.0–100.0)
PLATELETS: 368 10*3/uL (ref 150–400)
RBC: 3.43 MIL/uL — ABNORMAL LOW (ref 3.87–5.11)
RDW: 14 % (ref 11.5–15.5)
WBC: 16.5 10*3/uL — AB (ref 4.0–10.5)

## 2015-07-05 MED ORDER — POTASSIUM CHLORIDE CRYS ER 20 MEQ PO TBCR
40.0000 meq | EXTENDED_RELEASE_TABLET | Freq: Once | ORAL | Status: AC
  Administered 2015-07-05: 40 meq via ORAL
  Filled 2015-07-05: qty 2

## 2015-07-05 MED ORDER — DEXTROSE 50 % IV SOLN
INTRAVENOUS | Status: AC
  Filled 2015-07-05: qty 50

## 2015-07-05 MED ORDER — POTASSIUM CHLORIDE CRYS ER 20 MEQ PO TBCR: 20.0000 meq | EXTENDED_RELEASE_TABLET | Freq: Once | ORAL | Status: DC

## 2015-07-05 MED ORDER — INSULIN GLARGINE 100 UNIT/ML ~~LOC~~ SOLN
5.0000 [IU] | Freq: Every day | SUBCUTANEOUS | Status: DC
  Filled 2015-07-05 (×2): qty 0.05

## 2015-07-05 NOTE — Progress Notes (Signed)
Hypoglycemic Event  CBG: 53  Treatment: 50% dextrose injection  Symptoms: None  Follow-up CBG: [id number] CBG Result:146  Possible Reasons for Event: poor intake of food  Comments/MD notified: Hypoglycemic protocol    Molly [name]

## 2015-07-05 NOTE — Progress Notes (Signed)
Physical Therapy Treatment Patient Details Name: Molly [name] MRN: [id number] DOB: 14-Feb-1951 Today's Date: 07/05/2015    History of Present Illness Pt is a 65 y.o. female admitted for DKA and cholecystitis. PMH consists of HTN, cholelithiasis, and IDDM. She underwent cholecystectomy 06-30-15.    PT Comments    Pt with decreased motivation and desire to mobilize who benefits from encouragement and reassurance. Pt with incontinent stool on initial standing with assist for pericare and linen change after toileting as unable to complete on her own. Cues throughout for function and maximizing mobility. Pt educated for HEp and encouraged to perform as well as daily mobility with nursing. Will continue to follow.   Follow Up Recommendations  Home health PT;Supervision/Assistance - 24 hour     Equipment Recommendations  Rolling walker with 5" wheels    Recommendations for Other Services       Precautions / Restrictions Precautions Precautions: Fall    Mobility  Bed Mobility Overal bed mobility: Needs Assistance Bed Mobility: Supine to Sit     Supine to sit: Min assist     General bed mobility comments: cues for sequence, hand held assist to elevate trunk with increased time  Transfers Overall transfer level: Needs assistance   Transfers: Sit to/from Stand Sit to Stand: Min assist         General transfer comment: cues for sequencing and hand placement  Ambulation/Gait Ambulation/Gait assistance: Min guard Ambulation Distance (Feet): 75 Feet Assistive device: Rolling walker (2 wheeled) Gait Pattern/deviations: Step-through pattern;Decreased stride length;Drifts right/left   Gait velocity interpretation: Below normal speed for age/gender General Gait Details: cues for clearing obstacles as pt running into obstacles x 4 despite cues for safety and RW positioning. cues to step into RW. pt initially walked 12' bed to bath prior to hall gait   Stairs             Wheelchair Mobility    Modified Rankin (Stroke Patients Only)       Balance Overall balance assessment: Needs assistance   Sitting balance-Leahy Scale: Good       Standing balance-Leahy Scale: Poor                      Cognition Arousal/Alertness: Awake/alert Behavior During Therapy: Flat affect Overall Cognitive Status: Impaired/Different from baseline Area of Impairment: Safety/judgement         Safety/Judgement: Decreased awareness of safety          Exercises General Exercises - Lower Extremity Ankle Circles/Pumps: AROM;Seated;Both;15 reps Long Arc Quad: AROM;Seated;Both;15 reps Hip Flexion/Marching: AROM;Seated;Both;15 reps    General Comments        Pertinent Vitals/Pain Pain Assessment: No/denies pain    Home Living                      Prior Function            PT Goals (current goals can now be found in the care plan section) Progress towards PT goals: Progressing toward goals    Frequency       PT Plan Current plan remains appropriate    Co-evaluation             End of Session Equipment Utilized During Treatment: Gait belt Activity Tolerance: Patient tolerated treatment well Patient left: in bed;with call bell/phone within reach;with bed alarm set     Time: 1410-1436 PT Time Calculation (min) (ACUTE ONLY): 26 min  Charges:  $Gait Training: 8-22 mins $  Therapeutic Activity: 8-22 mins                    G Codes:      Melford Aase 07/20/15, 2:57 PM Elwyn Reach, [place]

## 2015-07-05 NOTE — Progress Notes (Signed)
[facility] PROGRESS NOTE  Molly [name] [id number] DOB: 04-May-1950 DOA: 06/29/2015 PCP: Rogers Blocker, MD  Assessment/Plan: Principal Problem:   DKA (diabetic ketoacidoses) ([place]) - DKA resolved  Hypoglycemia - Still hypoglycemic despite recent decrease in Lantus. As such will decrease Lantus dose to 5 units - Encourage oral intake  Hypokalemia - replaced yesterday - will reassess next am.  Acute blood loss anemia - Most likely from recent operation. - stable  Elevated blood pressures -improved on amlodipine. Will continue to monitor.   Hypokalemia - Resolved with improvement in oral intake - We'll reassess next a.m.    AKI (acute kidney injury) ([place]) - stable currently - Reassess BMP next a.m.    Abdominal aortic atherosclerosis with stenosis    Dehydration with hyponatremia - Most likely due to poor oral intake. Resolved with IVF    Leukocytosis - Patient had gangrenous gallbladder. Currently on Augmentin. Plan is to continue and reassess cbc next am. Will pan culture (urine and blood cultures)    Cholecystitis -Gen. surgery on board and planning laparoscopic cholecystectomy with intraoperative cholangiogram  Nausea - Will treat supportively  Code Status: full Family Communication: None at bedside Disposition Plan: pending improvement in WBC levels and disposition planning by PT   Consultants:  General surgery  Procedures:  none  Antibiotics:  Augmentin  HPI/Subjective: No acute issues reported to me overnight. Nausea improving. Pt looking forward to going home but I have discussed hypoglycemia and worsening leukocytosis and recommended further evaluation patient agreed.  Objective: Filed Vitals:   07/05/15 0520 07/05/15 1400  BP: 138/53 152/64  Pulse: 73 72  Temp: 98.6 F (37 C) 99.1 F (37.3 C)  Resp: 18 18   No intake or output data in the 24 hours ending 07/05/15 1612 Filed Weights   06/29/15 0747 06/29/15 2331 07/01/15 1724   Weight: 70.308 kg (155 lb) 69.128 kg (152 lb 6.4 oz) 70.308 kg (155 lb)    Exam:   General:  Patient in no acute distress alert and awake  Cardiovascular: S1 and S2 within normal limits, no rubs  Respiratory: No increased work of breathing, no wheezes, equal chest rise  Abdomen: Nondistended, no guarding  Musculoskeletal: No cyanosis or clubbing  Data Reviewed: Basic Metabolic Panel:  Recent Labs Lab 06/29/15 1221  06/29/15 1922 06/29/15 1939 06/30/15 0512 07/01/15 0717 07/02/15 0844 07/04/15 2359  NA 135  < > 138  --  139 138 140 139  K 3.4*  < > 3.4*  --  3.5 3.4* 3.6 3.1*  CL 93*  < > 98*  --  100* 103 108 104  CO2 28  < > 30  --  29 24 20* 27  GLUCOSE 538*  < > 129*  --  99 189* 138* 136*  BUN 44*  < > 39*  --  33* 26* 24* 10  CREATININE 1.76*  < > 1.60*  --  1.46* 1.45* 1.56* 0.77  CALCIUM 8.4*  < > 8.5*  --  8.0* 7.1* 7.5* 7.6*  MG 2.1  --   --   --   --   --   --   --   PHOS 1.7*  --   --  1.1*  --   --   --   --   < > = values in this interval not displayed. Liver Function Tests:  Recent Labs Lab 06/29/15 0758 06/30/15 0512 07/01/15 0717  AST 29 32 109*  ALT 25 23 63*  ALKPHOS 134*  104 115  BILITOT 0.7 0.5 0.4  PROT 7.3 6.1* 4.8*  ALBUMIN 2.9* 2.2* 1.9*    Recent Labs Lab 06/29/15 0758 06/30/15 0512  LIPASE 59* 27   No results for input(s): AMMONIA in the last 168 hours. CBC:  Recent Labs Lab 07/01/15 0717 07/02/15 0844 07/03/15 0303 07/04/15 1144 07/04/15 2359  WBC 17.4* 16.4* 15.0* 15.0* 16.5*  HGB 9.5* 9.7* 8.5* 8.9* 8.9*  HCT 28.7* 30.0* 27.0* 28.3* 28.6*  MCV 83.7 85.0 83.9 83.7 83.4  PLT 353 297 321 387 368   Cardiac Enzymes: No results for input(s): CKTOTAL, CKMB, CKMBINDEX, TROPONINI in the last 168 hours. BNP (last 3 results) No results for input(s): BNP in the last 8760 hours.  ProBNP (last 3 results) No results for input(s): PROBNP in the last 8760 hours.  CBG:  Recent Labs Lab 07/04/15 2122 07/05/15 0659  07/05/15 0728 07/05/15 0747 07/05/15 1148  GLUCAP 146* 56* 53* 146* 170*    Recent Results (from the past 240 hour(s))  Urine culture     Status: None   Collection Time: 06/29/15  4:36 PM  Result Value Ref Range Status   Specimen Description URINE, CLEAN CATCH  Final   Special Requests NONE  Final   Culture MULTIPLE SPECIES PRESENT, SUGGEST RECOLLECTION  Final   Report Status 07/01/2015 FINAL  Final  Surgical pcr screen     Status: None   Collection Time: 06/30/15 10:17 AM  Result Value Ref Range Status   MRSA, PCR NEGATIVE NEGATIVE Final   Staphylococcus aureus NEGATIVE NEGATIVE Final    Comment:        The Xpert SA Assay (FDA approved for NASAL specimens in patients over 63 years of age), is one component of a comprehensive surveillance program.  Test performance has been validated by [facility] for patients greater than or equal to 63 year old. It is not intended to diagnose infection nor to guide or monitor treatment.      Studies: No results found.  Scheduled Meds: . amLODipine  10 mg Oral Daily  . amoxicillin-clavulanate  1 tablet Oral BID  . dextrose      . insulin aspart  0-15 Units Subcutaneous TID WC  . insulin aspart  0-5 Units Subcutaneous QHS   Continuous Infusions: . sodium chloride 100 mL/hr at 07/04/15 1809  . insulin (NOVOLIN-R) infusion Stopped (06/30/15 0334)  . lactated ringers Stopped (06/30/15 1430)    Time spent: > 35 minutes  Velvet Bathe  [facility] Pager [phone]. If 7PM-7AM, please contact night-coverage at [email or web address], password [facility] 07/05/2015, 4:12 PM  LOS: 6 days

## 2015-07-05 NOTE — Progress Notes (Signed)
Hypoglycemic Event  CBG: 56  Treatment: 15 grams of Carbohydrate snak Symptoms: None  Follow-up CBG: [id number] Result:53  Possible Reasons for Event: Poor meal intake  Comments/MD notified:Hypoglycemic protocol    Azell Der

## 2015-07-06 ENCOUNTER — Inpatient Hospital Stay (HOSPITAL_COMMUNITY): Payer: Medicare Other

## 2015-07-06 LAB — CBC
HEMATOCRIT: 27.5 % — AB (ref 36.0–46.0)
Hemoglobin: 8.6 g/dL — ABNORMAL LOW (ref 12.0–15.0)
MCH: 26 pg (ref 26.0–34.0)
MCHC: 31.3 g/dL (ref 30.0–36.0)
MCV: 83.1 fL (ref 78.0–100.0)
Platelets: 380 10*3/uL (ref 150–400)
RBC: 3.31 MIL/uL — ABNORMAL LOW (ref 3.87–5.11)
RDW: 14.2 % (ref 11.5–15.5)
WBC: 11.6 10*3/uL — ABNORMAL HIGH (ref 4.0–10.5)

## 2015-07-06 LAB — GLUCOSE, CAPILLARY
Glucose-Capillary: 161 mg/dL — ABNORMAL HIGH (ref 65–99)
Glucose-Capillary: 154 mg/dL — ABNORMAL HIGH (ref 65–99)

## 2015-07-06 LAB — BASIC METABOLIC PANEL
Anion gap: 6 (ref 5–15)
BUN: 8 mg/dL (ref 6–20)
CALCIUM: 7.4 mg/dL — AB (ref 8.9–10.3)
CHLORIDE: 106 mmol/L (ref 101–111)
CO2: 26 mmol/L (ref 22–32)
CREATININE: 0.79 mg/dL (ref 0.44–1.00)
GFR calc non Af Amer: >60 mL/min (ref >60)
GLUCOSE: 188 mg/dL — AB (ref 65–99)
Potassium: 4.4 mmol/L (ref 3.5–5.1)
Sodium: 138 mmol/L (ref 135–145)

## 2015-07-06 MED ORDER — AMOXICILLIN-POT CLAVULANATE 875-125 MG PO TABS: 1.0000 | ORAL_TABLET | Freq: Two times a day (BID) | ORAL | Status: DC

## 2015-07-06 MED ORDER — AMLODIPINE BESYLATE 10 MG PO TABS: 10.0000 mg | ORAL_TABLET | Freq: Every day | ORAL | Status: DC

## 2015-07-06 NOTE — Progress Notes (Signed)
Inpatient Diabetes Program Recommendations  AACE/ADA: New Consensus Statement on Inpatient Glycemic Control (2015)  Target Ranges:  Prepandial:   less than 140 mg/dL      Peak postprandial:   less than 180 mg/dL (1-2 hours)      Critically ill patients:  140 - 180 mg/dL   Note several episodes of hypoglycemia. Has not had insulin at all in 2 days. Unsure why patient insulin needs have changed, noted poor po intake. Will watch trends while inpatient. May only need Sensitive Correction if needed at this point.  Thanks,   Tama Headings RN, MSN, [facility] Inpatient Diabetes Coordinator Team Pager [phone] (8a-5p)

## 2015-07-06 NOTE — Care Management Important Message (Signed)
Important Message  Patient Details  Name: Molly [name] MRN: [id number] Date of Birth: Jun 23, 1950   Medicare Important Message Given:  Yes    Barb Merino [place] 07/06/2015, 12:01 PM

## 2015-07-06 NOTE — Progress Notes (Signed)
Patient discharging hospital. IV DC'd. Patient dressed. Husband in room. Script given to patient. Patient AVS walked through and explained, no further questions from patient or husband. [place] services set up per [facility].

## 2015-07-06 NOTE — Discharge Summary (Signed)
Physician Discharge Summary  Molly [name] [id number] DOB: 08/21/1950 DOA: 06/29/2015  PCP: Rogers Blocker, MD  Admit date: 06/29/2015 Discharge date: 07/06/2015  Time spent: > 35 minutes  Recommendations for Outpatient Follow-up:  1. Please follow-up with general surgeon for further evaluation and recommendations. Considering her most recent operation while you were in hospital 2. Patient will need to have her hypoglycemic agents adjusted as she had hypoglycemia on subcutaneous insulin regimens. We'll continue oral hypoglycemic agents on discharge 3. Monitor blood pressures and adjust medications accordingly 4. Reassess white blood cell count   Discharge Diagnoses:  Principal Problem:   DKA (diabetic ketoacidoses) ([place]) Active Problems:   HTN (hypertension)   AKI (acute kidney injury) ([place])   Cholelithiasis   Abdominal aortic atherosclerosis with stenosis   Dehydration with hyponatremia   Leukocytosis   Cholecystitis   Discharge Condition: Stable  Diet recommendation: Diabetic diet  Filed Weights   06/29/15 0747 06/29/15 2331 07/01/15 1724  Weight: 70.308 kg (155 lb) 69.128 kg (152 lb 6.4 oz) 70.308 kg (155 lb)    History of present illness:  Patient is a 65 year old that presented with cholecystitis and DKA.  Hospital Course:  DKA (diabetic ketoacidoses) ([place]) - DKA resolved  Hypoglycemia - Will discharge patient off long-acting insulin and continue oral hypoglycemic agent. She is to monitor her blood sugars and follow-up with her primary care physician for further evaluation and recommendations  Acute blood loss anemia - Most likely from recent operation. - stable  Elevated blood pressures -Blood pressure improved with amlodipine  Hypokalemia - Resolved with improvement in oral intake   AKI (acute kidney injury) ([place]) - Resolved and most likely due to poor oral intake   Abdominal aortic atherosclerosis with stenosis   Dehydration with hyponatremia -  Most likely due to poor oral intake. Resolved with improved oral intake   Leukocytosis - Patient had gangrenous gallbladder as such will place on Augmentin. Will continue for 4 more days of complete a continuous seven-day treatment regimen   Cholecystitis -Gen. surgery on board and patient is status post laparoscopic cholecystectomy with intraoperative cholangiogram  Nausea - Resolved  Procedures:  None  Consultations:  General surgery  Discharge Exam: Filed Vitals:   07/06/15 1013 07/06/15 1415  BP: 134/69 154/63  Pulse: 76 74  Temp: 98.9 F (37.2 C) 98.4 F (36.9 C)  Resp: 17 17    General: Pt in nad, alert and awake Cardiovascular: rrr, no mrg Respiratory: cta bl, no wheezes  Discharge Instructions   Discharge Instructions    Call MD for:  difficulty breathing, headache or visual disturbances    Complete by:  As directed      Call MD for:  extreme fatigue    Complete by:  As directed      Call MD for:  temperature >100.4    Complete by:  As directed      Diet - low sodium heart healthy    Complete by:  As directed      Discharge instructions    Complete by:  As directed   Please monitor your blood sugars at least 2 times daily.  We'll discontinue your long-acting insulin given hypoglycemia. Continue your oral hypoglycemic agents but continue to monitor your blood sugars.  Follow-up with her primary care physician in the next one or 2 weeks or sooner should any new concerns arise  Also follow-up with general surgery after discharge for routine follow-up evaluation after you're most recent operation  Increase activity slowly    Complete by:  As directed           Current Discharge Medication List    START taking these medications   Details  amLODipine (NORVASC) 10 MG tablet Take 1 tablet (10 mg total) by mouth daily. Qty: 30 tablet, Refills: 0    amoxicillin-clavulanate (AUGMENTIN) 875-125 MG tablet Take 1 tablet by mouth 2 (two) times  daily. Qty: 8 tablet, Refills: 0      CONTINUE these medications which have NOT CHANGED   Details  cholecalciferol (VITAMIN D) 1000 units tablet Take 1,000 Units by mouth daily. Refills: 2    glimepiride (AMARYL) 4 MG tablet Take 4 mg by mouth daily with breakfast.    Lactobacillus (PROBIOTIC ACIDOPHILUS PO) Take by mouth daily.    metoCLOPramide (REGLAN) 5 MG tablet Take 5 mg by mouth 2 (two) times daily.    ondansetron (ZOFRAN) 4 MG tablet Take 1 tablet (4 mg total) by mouth every 8 (eight) hours as needed for nausea or vomiting. Qty: 20 tablet, Refills: 0      STOP taking these medications     Insulin Detemir (LEVEMIR FLEXPEN) 100 UNIT/ML Pen      losartan-hydrochlorothiazide (HYZAAR) 50-12.5 MG tablet        Allergies  Allergen Reactions  . Mercury Nausea And Vomiting   Follow-up Information    Follow up with [facility] On 07/21/2015.   Specialty:  General Surgery   Why:  arrive by 0915 for a 0945AM post op check   Contact information:   [address] [place] [id number] [phone]        The results of significant diagnostics from this hospitalization (including imaging, microbiology, ancillary and laboratory) are listed below for reference.    Significant Diagnostic Studies: Dg Chest 2 View  07/05/2015  CLINICAL DATA:  Elevated white blood cells, fever, high blood sugar EXAM: CHEST  2 VIEW COMPARISON:  None. FINDINGS: Limited inspiratory effect. Cardiac enlargement. Consolidation in the posterior left lower lobe. Small left pleural effusion. Right lung clear. IMPRESSION: Left lower lobe pneumonia. Followup PA and lateral chest X-ray is recommended in 3-4 weeks following trial of antibiotic therapy to ensure resolution and exclude underlying malignancy. Electronically Signed   By: Skipper Cliche M.D.   On: 07/05/2015 19:46   Dg Cholangiogram Operative  06/30/2015  CLINICAL DATA:  Laparoscopic cholecystectomy. EXAM: INTRAOPERATIVE  CHOLANGIOGRAM TECHNIQUE: Cholangiographic images from the C-arm fluoroscopic device were submitted for interpretation post-operatively. Please see the procedural report for the amount of contrast and the fluoroscopy time utilized. COMPARISON:  CT 06/21/2015 FINDINGS: No persistent filling defects in the common duct. Intrahepatic ducts are incompletely visualized, appearing decompressed centrally. Contrast passes into the duodenum. : Negative for retained common duct stone. Electronically Signed   By: Lucrezia Europe M.D.   On: 06/30/2015 15:54   Ct Abdomen Pelvis W Contrast  06/21/2015  CLINICAL DATA:  65 year old female with epigastric pain common nausea and vomiting since yesterday evening. Loose stools. EXAM: CT ABDOMEN AND PELVIS WITH CONTRAST TECHNIQUE: Multidetector CT imaging of the abdomen and pelvis was performed using the standard protocol following bolus administration of intravenous contrast. CONTRAST:  54mL OMNIPAQUE IOHEXOL 300 MG/ML  SOLN COMPARISON:  No priors. FINDINGS: Lower chest: Mild scarring in the lung bases bilaterally. Atherosclerotic calcifications in the left circumflex and right coronary arteries. Mild cardiomegaly. Hepatobiliary: No cystic or solid hepatic lesions. No intra or extrahepatic biliary ductal dilatation. Gallbladder is normal in  appearance. Pancreas: No pancreatic mass. No pancreatic ductal dilatation. No pancreatic or peripancreatic fluid or inflammatory changes. Spleen: Unremarkable. Adrenals/Urinary Tract: Bilateral adrenal glands and left kidney are normal in appearance. Sub cm low-attenuation lesion in the upper pole of the right kidney is too small to definitively characterize, but is likely a cyst. No hydroureteronephrosis. Urinary bladder is normal in appearance. Bilateral adrenal glands are normal in appearance. Stomach/Bowel: Normal appearance of the stomach. No pathologic dilatation of small bowel or colon. Colon is essentially completely decompressed. Normal  appendix. Vascular/Lymphatic: Extensive atherosclerosis throughout the abdominal and pelvic vasculature, including an area of moderate to severe stenosis immediately beyond the right renal artery origin and before the left renal artery origin (best demonstrated on images 32 and 33 of series 201). Celiac axis, superior mesenteric artery and inferior mesenteric artery appear patent at this time. No lymphadenopathy noted in the abdomen or pelvis. Reproductive: Uterus and ovaries are unremarkable in appearance. Other: No significant volume of ascites.  No pneumoperitoneum. Musculoskeletal: There are no aggressive appearing lytic or blastic lesions noted in the visualized portions of the skeleton. IMPRESSION: 1. No acute findings in the abdomen or pelvis to account for the patient's symptoms. 2. 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 3. Normal appendix. 4. Additional incidental findings, as above. Electronically Signed   By: Vinnie Langton M.D.   On: 06/21/2015 15:10   US Abdomen Limited Ruq  06/21/2015  CLINICAL DATA:  Patient with right upper quadrant abdominal pain since last night. EXAM: US ABDOMEN LIMITED - RIGHT UPPER QUADRANT COMPARISON:  Abdominal ultrasound 08/18/2014 FINDINGS: Gallbladder: Multiple mobile stones are demonstrated within the gallbladder lumen. No gallbladder wall thickening. No sonographic Murphy's sign. No pericholecystic fluid. Common bile duct: Diameter: 5 mm Liver: No focal lesion identified. Within normal limits in parenchymal echogenicity. IMPRESSION: Cholelithiasis without sonographic evidence for acute cholecystitis. Electronically Signed   By: Lovey Newcomer M.D.   On: 06/21/2015 12:25    Microbiology: Recent Results (from the past 240 hour(s))  Urine culture     Status: None   Collection Time: 06/29/15  4:36 PM  Result Value  Ref Range Status   Specimen Description URINE, CLEAN CATCH  Final   Special Requests NONE  Final   Culture MULTIPLE SPECIES PRESENT, SUGGEST RECOLLECTION  Final   Report Status 07/01/2015 FINAL  Final  Surgical pcr screen     Status: None   Collection Time: 06/30/15 10:17 AM  Result Value Ref Range Status   MRSA, PCR NEGATIVE NEGATIVE Final   Staphylococcus aureus NEGATIVE NEGATIVE Final    Comment:        The Xpert SA Assay (FDA approved for NASAL specimens in patients over 70 years of age), is one component of a comprehensive surveillance program.  Test performance has been validated by [facility] for patients greater than or equal to 29 year old. It is not intended to diagnose infection nor to guide or monitor treatment.      Labs: Basic Metabolic Panel:  Recent Labs Lab 06/29/15 1939 06/30/15 0512 07/01/15 0717 07/02/15 0844 07/04/15 2359 07/06/15 0530  NA  --  139 138 140 139 138  K  --  3.5 3.4* 3.6 3.1* 4.4  CL  --  100* 103 108 104 106  CO2  --  29 24 20* 27 26  GLUCOSE  --  99 189*  138* 136* 188*  BUN  --  33* 26* 24* 10 8  CREATININE  --  1.46* 1.45* 1.56* 0.77 0.79  CALCIUM  --  8.0* 7.1* 7.5* 7.6* 7.4*  PHOS 1.1*  --   --   --   --   --    Liver Function Tests:  Recent Labs Lab 06/30/15 0512 07/01/15 0717  AST 32 109*  ALT 23 63*  ALKPHOS 104 115  BILITOT 0.5 0.4  PROT 6.1* 4.8*  ALBUMIN 2.2* 1.9*    Recent Labs Lab 06/30/15 0512  LIPASE 27   No results for input(s): AMMONIA in the last 168 hours. CBC:  Recent Labs Lab 07/02/15 0844 07/03/15 0303 07/04/15 1144 07/04/15 2359 07/06/15 0530  WBC 16.4* 15.0* 15.0* 16.5* 11.6*  HGB 9.7* 8.5* 8.9* 8.9* 8.6*  HCT 30.0* 27.0* 28.3* 28.6* 27.5*  MCV 85.0 83.9 83.7 83.4 83.1  PLT 297 321 387 368 380   Cardiac Enzymes: No results for input(s): CKTOTAL, CKMB, CKMBINDEX, TROPONINI in the last 168 hours. BNP: BNP (last 3 results) No results for input(s): BNP in the last 8760  hours.  ProBNP (last 3 results) No results for input(s): PROBNP in the last 8760 hours.  CBG:  Recent Labs Lab 07/05/15 1148 07/05/15 1646 07/05/15 2151 07/06/15 0632 07/06/15 1155  GLUCAP 170* 178* 180* 161* 154*    Signed:  Velvet Bathe MD.  [facility] 07/06/2015, 3:15 PM

## 2015-07-06 NOTE — Care Management Note (Signed)
Case Management Note  Patient Details  Name: Molly [name] MRN: [id number] Date of Birth: [birth year]  Subjective/Objective:                    Action/Plan: Patient discharging home today with [facility] services. CM met with the patient and provided her a list of [facility] agencies in the [place] area. She selected [facility]. Stephanie with [facility] notified and accepted the referral. Patient states she doesn't need any DME. Bedside RN updated.  Expected Discharge Date:  07/03/15               Expected Discharge Plan:  [place]  In-House Referral:     Discharge planning Services  CM Consult  Post Acute Care Choice:  Home Health Choice offered to:  Patient  DME Arranged:    DME Agency:     HH Arranged:  RN, PT [place] Agency:  [facility]  Status of Service:  Completed, signed off  Medicare Important Message Given:  Yes Date Medicare IM Given:    Medicare IM give by:    Date Additional Medicare IM Given:    Additional Medicare Important Message give by:     If discussed at [place] of [place] Meetings, dates discussed:    Additional Comments:  [name], RN 07/06/2015, 4:38 PM

## 2015-07-07 LAB — URINE CULTURE: Culture: 80000

## 2015-07-10 LAB — CULTURE, BLOOD (ROUTINE X 2): Culture: NO GROWTH

## 2015-12-29 ENCOUNTER — Ambulatory Visit
Admission: RE | Admit: 2015-12-29 | Discharge: 2015-12-29 | Disposition: A | Discharge status: Home / Self Care | Payer: Medicare Other | Source: Ambulatory Visit | Attending: Physician Assistant | Admitting: Physician Assistant

## 2015-12-29 ENCOUNTER — Other Ambulatory Visit: Payer: Self-pay | Admitting: Physician Assistant

## 2015-12-29 DIAGNOSIS — R079 Chest pain, unspecified: Secondary | ICD-10-CM

## 2015-12-29 DIAGNOSIS — R1012 Left upper quadrant pain: Secondary | ICD-10-CM

## 2015-12-29 DIAGNOSIS — R9389 Abnormal findings on diagnostic imaging of other specified body structures: Secondary | ICD-10-CM

## 2016-01-03 ENCOUNTER — Other Ambulatory Visit: Payer: Self-pay | Admitting: Physician Assistant

## 2016-01-03 DIAGNOSIS — R9389 Abnormal findings on diagnostic imaging of other specified body structures: Secondary | ICD-10-CM

## 2016-01-03 DIAGNOSIS — R079 Chest pain, unspecified: Secondary | ICD-10-CM

## 2016-01-10 ENCOUNTER — Ambulatory Visit
Admission: RE | Admit: 2016-01-10 | Discharge: 2016-01-10 | Disposition: A | Discharge status: Home / Self Care | Payer: Medicare Other | Source: Ambulatory Visit | Attending: Physician Assistant | Admitting: Physician Assistant

## 2016-01-10 DIAGNOSIS — R079 Chest pain, unspecified: Secondary | ICD-10-CM

## 2016-01-10 DIAGNOSIS — R9389 Abnormal findings on diagnostic imaging of other specified body structures: Secondary | ICD-10-CM

## 2016-01-10 MED ORDER — IOPAMIDOL (ISOVUE-300) INJECTION 61%
75.0000 mL | Freq: Once | INTRAVENOUS | Status: AC | PRN
  Administered 2016-01-10: 75 mL via INTRAVENOUS

## 2016-04-12 ENCOUNTER — Other Ambulatory Visit: Payer: Self-pay | Admitting: Cardiology

## 2016-04-12 DIAGNOSIS — I739 Peripheral vascular disease, unspecified: Secondary | ICD-10-CM

## 2016-04-13 ENCOUNTER — Ambulatory Visit (HOSPITAL_COMMUNITY)
Admission: RE | Admit: 2016-04-13 | Discharge: 2016-04-13 | Disposition: A | Discharge status: Home / Self Care | Payer: Medicare Other | Source: Ambulatory Visit | Attending: Vascular Surgery | Admitting: Vascular Surgery

## 2016-04-13 DIAGNOSIS — I739 Peripheral vascular disease, unspecified: Secondary | ICD-10-CM | POA: Diagnosis not present

## 2016-04-13 DIAGNOSIS — I7090 Unspecified atherosclerosis: Secondary | ICD-10-CM | POA: Insufficient documentation

## 2016-04-13 DIAGNOSIS — I10 Essential (primary) hypertension: Secondary | ICD-10-CM | POA: Insufficient documentation

## 2016-04-13 DIAGNOSIS — E119 Type 2 diabetes mellitus without complications: Secondary | ICD-10-CM | POA: Insufficient documentation

## 2016-04-24 ENCOUNTER — Ambulatory Visit: Payer: Medicare Other | Admitting: Cardiovascular Disease

## 2016-05-02 ENCOUNTER — Encounter: Payer: Self-pay | Admitting: Vascular Surgery

## 2016-05-05 ENCOUNTER — Encounter: Payer: Self-pay | Admitting: Vascular Surgery

## 2016-05-05 ENCOUNTER — Ambulatory Visit (INDEPENDENT_AMBULATORY_CARE_PROVIDER_SITE_OTHER): Payer: Medicare Other | Admitting: Vascular Surgery

## 2016-05-05 VITALS — BP 128/60 | HR 60 | Temp 98.4°F | Resp 16 | Ht 62.0 in | Wt 145.0 lb

## 2016-05-05 DIAGNOSIS — R0989 Other specified symptoms and signs involving the circulatory and respiratory systems: Secondary | ICD-10-CM

## 2016-05-05 DIAGNOSIS — I70219 Atherosclerosis of native arteries of extremities with intermittent claudication, unspecified extremity: Secondary | ICD-10-CM

## 2016-05-05 NOTE — Progress Notes (Signed)
Patient name: Molly [name] MRN: [id number] DOB: [birth year] Sex: female  REASON FOR CONSULT: Claudication. Referred by Dr. Wynonia Lawman.   HPI: Molly [name] is a 66 y.o. female, who presents for evaluation of peripheral vascular disease. She believes had a screening study done by [place] [facility] which suggested a diminished ABI. She's up swelling had a duplex scan and Doppler studies in our office which showed she had significant peripheral vascular disease on the left. She was sent for vascular consultation.  She had claudication in the left calf several months ago which was brought on by ambulation relieved with rest but recently has not had any significant claudication. She denies any rest pain. She does describe some pain in both feet at night consistent with neuropathy. She denies any history of nonhealing ulcers.  Her risk factors for peripheral vascular disease include diabetes, hypertension, a family history of premature cardiovascular disease (her brother had a myocardial infarction at age 42). She is not a smoker.  She denies any history of stroke, TIAs, expressive or receptive aphasia, or amaurosis fugax.  Past Medical History:  Diagnosis Date  . Diabetes mellitus without complication ([place])   . Hypertension     No family history on file.  SOCIAL HISTORY: Social History   Social History  . Marital status: Divorced    Spouse name: N/A  . Number of children: N/A  . Years of education: N/A   Occupational History  . Not on file.   Social History Main Topics  . Smoking status: Never Smoker  . Smokeless tobacco: Never Used  . Alcohol use No  . Drug use: Unknown  . Sexual activity: Not on file   Other Topics Concern  . Not on file   Social History Narrative  . No narrative on file    Allergies  Allergen Reactions  . Mercury Nausea And Vomiting    Current Outpatient Prescriptions  Medication Sig Dispense Refill  . amLODipine (NORVASC) 10 MG tablet Take 1 tablet  (10 mg total) by mouth daily. 30 tablet 0  . aspirin EC 81 MG tablet Take 81 mg by mouth daily.    . cholecalciferol (VITAMIN D) 1000 units tablet Take 1,000 Units by mouth daily.  2  . cholestyramine (QUESTRAN) 4 g packet     . glimepiride (AMARYL) 4 MG tablet Take 4 mg by mouth daily with breakfast.    . irbesartan-hydrochlorothiazide (AVALIDE) 150-12.5 MG tablet Take 1 tablet by mouth daily.    . nebivolol (BYSTOLIC) 10 MG tablet Take 10 mg by mouth daily.    . ondansetron (ZOFRAN) 4 MG tablet Take 1 tablet (4 mg total) by mouth every 8 (eight) hours as needed for nausea or vomiting. 20 tablet 0  . rosuvastatin (CRESTOR) 5 MG tablet     . spironolactone (ALDACTONE) 25 MG tablet Take 12.5 mg by mouth daily.     . Lactobacillus (PROBIOTIC ACIDOPHILUS PO) Take by mouth daily.    . metoCLOPramide (REGLAN) 5 MG tablet Take 5 mg by mouth 2 (two) times daily.     No current facility-administered medications for this visit.     REVIEW OF SYSTEMS:  [X]  denotes positive finding, [ ]  denotes negative finding Cardiac  Comments:  Chest pain or chest pressure:    Shortness of breath upon exertion: X   Short of breath when lying flat: X   Irregular heart rhythm:        Vascular    Pain in calf, thigh, or  hip brought on by ambulation: X   Pain in feet at night that wakes you up from your sleep:  X   Blood clot in your veins:    Leg swelling:  X       Pulmonary    Oxygen at home:    Productive cough:     Wheezing:         Neurologic    Sudden weakness in arms or legs:     Sudden numbness in arms or legs:     Sudden onset of difficulty speaking or slurred speech:    Temporary loss of vision in one eye:     Problems with dizziness:         Gastrointestinal    Blood in stool:     Vomited blood:         Genitourinary    Burning when urinating:     Blood in urine:        Psychiatric    Major depression:         Hematologic    Bleeding problems:    Problems with blood clotting too  easily:        Skin    Rashes or ulcers:        Constitutional    Fever or chills:      PHYSICAL EXAM: Vitals:   05/05/16 1120  BP: 128/60  Pulse: 60  Resp: 16  Temp: 98.4 F (36.9 C)  TempSrc: Oral  SpO2: 99%  Weight: 145 lb (65.8 kg)  Height: 5\' 2"  (1.575 m)    GENERAL: The patient is a well-nourished female, in no acute distress. The vital signs are documented above. CARDIAC: There is a regular rate and rhythm.  VASCULAR: She has bilateral carotid bruits.  On the right side she has a palpable femoral pulse and a diminished but palpable popliteal and dorsalis pedis pulse.  On the left side, she has a palpable femoral pulse. I cannot palpate a popliteal or pedal pulses.  She has no significant lower extremity swelling.  PULMONARY: There is good air exchange bilaterally without wheezing or rales. ABDOMEN: Soft and non-tender with normal pitched bowel sounds.  MUSCULOSKELETAL: There are no major deformities or cyanosis. NEUROLOGIC: No focal weakness or paresthesias are detected. SKIN: There are no ulcers or rashes noted. PSYCHIATRIC: The patient has a normal affect.  DATA:   BILATERAL LOWER EXTREMITY ARTERIAL DUPLEX SCAN: I have reviewed the bilateral lower extremity arterial duplex scan that was done on 04/13/2016. The right lower extremity had biphasic flow throughout with no significant stenoses identified. On the left side there was probable occlusion of the left superficial femoral artery in the mid to distal thigh.   BILATERAL LOWER EXTREMITY ARTERIAL DOPPLER STUDY: I have reviewed the bilateral lower extremity arterial Doppler study that was done on 04/13/2016. This showed biphasic Doppler signals in the right posterior tibial and dorsalis pedis positions with an ABI of 98%. On the left side or monophasic signals in the posterior tibial and dorsalis pedis positions with an ABI of 53%.  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 She is not a smoker. We have discussed the importance of a structured walking program. Currently she has normal symptoms and so I would not recommend an aggressive approach to her peripheral vascular disease. I've ordered follow up ABIs in 1 year  and I'll see her back at that time. She knows to call sooner if she has problems.  BILATERAL CAROTID BRUITS: This patient has bilateral carotid bruits. I recommended a carotid duplex scan. She would like to discuss this with Dr. Wynonia Lawman. I did review Epic and did not find a previous carotid duplex scan. She is on aspirin and is on a statin.  Deitra Mayo [facility] [phone]

## 2016-05-08 NOTE — Addendum Note (Signed)
Addended by: Lianne Cure A on: 05/08/2016 09:20 AM   Modules accepted: Orders

## 2016-06-05 ENCOUNTER — Encounter (INDEPENDENT_AMBULATORY_CARE_PROVIDER_SITE_OTHER): Payer: Medicare Other | Admitting: Ophthalmology

## 2016-06-05 DIAGNOSIS — E11311 Type 2 diabetes mellitus with unspecified diabetic retinopathy with macular edema: Secondary | ICD-10-CM

## 2016-06-05 DIAGNOSIS — H4313 Vitreous hemorrhage, bilateral: Secondary | ICD-10-CM | POA: Diagnosis not present

## 2016-06-05 DIAGNOSIS — E113592 Type 2 diabetes mellitus with proliferative diabetic retinopathy without macular edema, left eye: Secondary | ICD-10-CM | POA: Diagnosis not present

## 2016-06-05 DIAGNOSIS — E113511 Type 2 diabetes mellitus with proliferative diabetic retinopathy with macular edema, right eye: Secondary | ICD-10-CM | POA: Diagnosis not present

## 2016-06-05 DIAGNOSIS — H35033 Hypertensive retinopathy, bilateral: Secondary | ICD-10-CM

## 2016-06-05 DIAGNOSIS — I10 Essential (primary) hypertension: Secondary | ICD-10-CM | POA: Diagnosis not present

## 2016-06-05 DIAGNOSIS — H3411 Central retinal artery occlusion, right eye: Secondary | ICD-10-CM

## 2016-06-15 ENCOUNTER — Other Ambulatory Visit (INDEPENDENT_AMBULATORY_CARE_PROVIDER_SITE_OTHER): Payer: Medicare Other | Admitting: Ophthalmology

## 2016-06-15 DIAGNOSIS — E11311 Type 2 diabetes mellitus with unspecified diabetic retinopathy with macular edema: Secondary | ICD-10-CM | POA: Diagnosis not present

## 2016-06-15 DIAGNOSIS — E113512 Type 2 diabetes mellitus with proliferative diabetic retinopathy with macular edema, left eye: Secondary | ICD-10-CM

## 2016-07-24 ENCOUNTER — Other Ambulatory Visit (INDEPENDENT_AMBULATORY_CARE_PROVIDER_SITE_OTHER): Payer: Medicare Other | Admitting: Ophthalmology

## 2016-07-24 DIAGNOSIS — E113592 Type 2 diabetes mellitus with proliferative diabetic retinopathy without macular edema, left eye: Secondary | ICD-10-CM | POA: Diagnosis not present

## 2016-07-24 DIAGNOSIS — E11311 Type 2 diabetes mellitus with unspecified diabetic retinopathy with macular edema: Secondary | ICD-10-CM

## 2016-10-04 ENCOUNTER — Encounter: Payer: Self-pay | Admitting: Podiatry

## 2016-10-04 ENCOUNTER — Ambulatory Visit (INDEPENDENT_AMBULATORY_CARE_PROVIDER_SITE_OTHER): Payer: Medicare Other | Admitting: Podiatry

## 2016-10-04 VITALS — BP 161/68 | HR 57 | Resp 18

## 2016-10-04 DIAGNOSIS — E1142 Type 2 diabetes mellitus with diabetic polyneuropathy: Secondary | ICD-10-CM | POA: Diagnosis not present

## 2016-10-04 DIAGNOSIS — E1151 Type 2 diabetes mellitus with diabetic peripheral angiopathy without gangrene: Secondary | ICD-10-CM

## 2016-10-04 DIAGNOSIS — B351 Tinea unguium: Secondary | ICD-10-CM | POA: Diagnosis not present

## 2016-10-04 NOTE — Patient Instructions (Signed)

## 2016-10-04 NOTE — Progress Notes (Signed)
   Subjective:    Patient ID: Molly [name], female    DOB: 10/26/1950, 66 y.o.   MRN: [id number]  HPI This diabetic patient presents today complaining of thickened toenails right and left feet becoming progressively more painful and second over the last 2 years. Patient has attempted to trim the nails herself, however, is unable to do so and requests toenail debridement. Patient denies any professional care. Patient does recall some history of evaluation by vascular Dr. for cramping when walking in 2017. According to the patient the vascular Dr. advised her to walk on a regular basis and reappoint for follow-up in 2018 Patient has a history of claudication when walking Patient has a history of burning tingling and numbness in her feet Patient denies smoking history    Review of Systems  Cardiovascular:       CALF PAIN WITH WALKING  All other systems reviewed and are negative.      Objective:   Physical Exam Patient estimates weight 50 pounds an approximate 5 foot 2 inches  Vascular: No peripheral edema bilaterally No calf pain to palpation bilaterally DP and PT pulses 0/4 bilaterally Reflex immediate bilaterally  Neurological: Sensation to 10 g monofilament wire intact 5/9 bilaterally vibratory sensation reactive bilaterally Ankle reflexes reactive bilaterally  Dermatological No open skin lesions bilaterally Atrophic skin with absent hair growth bilaterally The toenails elongated and brittle 6-10 with maximum deformity in the hallux nails  Musculoskeletal: Stable gait Manual motor testing dorsi flexion, plantar flexion, inversion, eversion 5/5 bilaterally       Assessment & Plan:   Assessment: Diabetic peripheral neuropathy  Diabetic peripheral arterial disease Mycotic toenails 6-10  Plan: To you the results of exam with patient informed that she had decreased vascular flow and neuropathy. Chart review does not demonstrate any lower extremity arterial Doppler,  however, patient may have had this out of the system Will return patient for schedule visit for nail debridement and if not able to determine if she's had a lower extremity arterial Doppler will over and arterial Doppler at that time. Debridement of toenails 6-10 mechanically and left without any bleeding  Reappoint 3 months

## 2016-11-24 ENCOUNTER — Ambulatory Visit (INDEPENDENT_AMBULATORY_CARE_PROVIDER_SITE_OTHER): Payer: Medicare Other | Admitting: Ophthalmology

## 2016-11-24 DIAGNOSIS — E113593 Type 2 diabetes mellitus with proliferative diabetic retinopathy without macular edema, bilateral: Secondary | ICD-10-CM | POA: Diagnosis not present

## 2016-11-24 DIAGNOSIS — H43813 Vitreous degeneration, bilateral: Secondary | ICD-10-CM

## 2016-11-24 DIAGNOSIS — I10 Essential (primary) hypertension: Secondary | ICD-10-CM

## 2016-11-24 DIAGNOSIS — H35033 Hypertensive retinopathy, bilateral: Secondary | ICD-10-CM

## 2016-11-24 DIAGNOSIS — E11319 Type 2 diabetes mellitus with unspecified diabetic retinopathy without macular edema: Secondary | ICD-10-CM | POA: Diagnosis not present

## 2017-01-03 ENCOUNTER — Ambulatory Visit: Payer: Medicare Other | Admitting: Podiatry

## 2017-01-16 ENCOUNTER — Encounter: Payer: Self-pay | Admitting: Podiatry

## 2017-01-16 ENCOUNTER — Ambulatory Visit (INDEPENDENT_AMBULATORY_CARE_PROVIDER_SITE_OTHER): Payer: Medicare Other | Admitting: Podiatry

## 2017-01-16 DIAGNOSIS — B351 Tinea unguium: Secondary | ICD-10-CM

## 2017-01-16 DIAGNOSIS — E1151 Type 2 diabetes mellitus with diabetic peripheral angiopathy without gangrene: Secondary | ICD-10-CM

## 2017-01-16 NOTE — Patient Instructions (Addendum)
Lower extremity arterial Doppler dated 04/13/2016 review today Please contact vascular and vein for follow-up as they have requested  Diabetes and Foot Care Diabetes may cause you to have problems because of poor blood supply (circulation) to your feet and legs. This may cause the skin on your feet to become thinner, break easier, and heal more slowly. Your skin may become dry, and the skin may peel and crack. You may also have nerve damage in your legs and feet causing decreased feeling in them. You may not notice minor injuries to your feet that could lead to infections or more serious problems. Taking care of your feet is one of the most important things you can do for yourself. Follow these instructions at home:  Wear shoes at all times, even in the house. Do not go barefoot. Bare feet are easily injured.  Check your feet daily for blisters, cuts, and redness. If you cannot see the bottom of your feet, use a mirror or ask someone for help.  Wash your feet with warm water (do not use hot water) and mild soap. Then pat your feet and the areas between your toes until they are completely dry. Do not soak your feet as this can dry your skin.  Apply a moisturizing lotion or petroleum jelly (that does not contain alcohol and is unscented) to the skin on your feet and to dry, brittle toenails. Do not apply lotion between your toes.  Trim your toenails straight across. Do not dig under them or around the cuticle. File the edges of your nails with an emery board or nail file.  Do not cut corns or calluses or try to remove them with medicine.  Wear clean socks or stockings every day. Make sure they are not too tight. Do not wear knee-high stockings since they may decrease blood flow to your legs.  Wear shoes that fit properly and have enough cushioning. To break in new shoes, wear them for just a few hours a day. This prevents you from injuring your feet. Always look in your shoes before you put  them on to be sure there are no objects inside.  Do not cross your legs. This may decrease the blood flow to your feet.  If you find a minor scrape, cut, or break in the skin on your feet, keep it and the skin around it clean and dry. These areas may be cleansed with mild soap and water. Do not cleanse the area with peroxide, alcohol, or iodine.  When you remove an adhesive bandage, be sure not to damage the skin around it.  If you have a wound, look at it several times a day to make sure it is healing.  Do not use heating pads or hot water bottles. They may burn your skin. If you have lost feeling in your feet or legs, you may not know it is happening until it is too late.  Make sure your health care provider performs a complete foot exam at least annually or more often if you have foot problems. Report any cuts, sores, or bruises to your health care provider immediately. Contact a health care provider if:  You have an injury that is not healing.  You have cuts or breaks in the skin.  You have an ingrown nail.  You notice redness on your legs or feet.  You feel burning or tingling in your legs or feet.  You have pain or cramps in your legs and feet.  Your legs or feet are numb.  Your feet always feel cold. Get help right away if:  There is increasing redness, swelling, or pain in or around a wound.  There is a red line that goes up your leg.  Pus is coming from a wound.  You develop a fever or as directed by your health care provider.  You notice a bad smell coming from an ulcer or wound. This information is not intended to replace advice given to you by your health care provider. Make sure you discuss any questions you have with your health care provider. Document Released: 03/31/2000 Document Revised: 09/09/2015 Document Reviewed: 09/10/2012 Elsevier Interactive Patient Education  2017 [employer].

## 2017-01-16 NOTE — Progress Notes (Signed)
Patient ID: Molly [name], female   DOB: 11-29-1950, 66 y.o.   MRN: [id number]   Subjective: This diabetic patient presents today complaining of thickened toenails right and left feet becoming progressively more painful and second over the last 2 years. Patient has attempted to trim the nails herself, however, is unable to do so and requests toenail debridement. Patient denies any professional care. Patient does recall some history of evaluation by vascular Dr. for cramping when walking in 2017. According to the patient the vascular Dr. advised her to walk on a regular basis and reappoint for follow-up in 2018 Patient has a history of claudication when walking Patient has a history of burning tingling and numbness in her feet Patient denies smoking history  Vascular: No peripheral edema bilaterally No calf pain to palpation bilaterally DP and PT pulses 0/4 bilaterally Reflex immediate bilaterally  Neurological: Sensation to 10 g monofilament wire intact 5/9 bilaterally vibratory sensation reactive bilaterally Ankle reflexes reactive bilaterally  Dermatological No open skin lesions bilaterally Atrophic skin with absent hair growth bilaterally The toenails elongated and brittle 6-10 with maximum deformity in the hallux nails  Musculoskeletal: Stable gait Manual motor testing dorsi flexion, plantar flexion, inversion, eversion 5/5 bilaterally  Chart review lower extremity arterial Doppler 04/13/2016 cardiovascular imaging at [place]: Patent right lower extremity with no focal stenosis visualized Probable occlusion left mid to distal thigh with collaterals feeding on distally 50-74 percent stenosis range     Assessment & Plan:   Assessment: Diabetic peripheral neuropathy  Diabetic peripheral arterial disease Mycotic toenails 6-10  Plan: Debridement toenails 6-10 mechanically and electrically without any bleeding Discussed patient's lower extremity arterial  Doppler exam as described above. Patient has a pending follow-up with vascular and vein yearly  Reappoint 3 months

## 2017-02-20 ENCOUNTER — Other Ambulatory Visit: Payer: Self-pay | Admitting: Cardiology

## 2017-02-20 DIAGNOSIS — I6529 Occlusion and stenosis of unspecified carotid artery: Secondary | ICD-10-CM

## 2017-04-16 ENCOUNTER — Ambulatory Visit: Payer: Medicare Other | Admitting: Podiatry

## 2017-04-23 ENCOUNTER — Other Ambulatory Visit: Payer: Self-pay | Admitting: Physician Assistant

## 2017-04-23 DIAGNOSIS — R1011 Right upper quadrant pain: Secondary | ICD-10-CM

## 2017-04-23 DIAGNOSIS — R110 Nausea: Secondary | ICD-10-CM

## 2017-04-23 DIAGNOSIS — R1013 Epigastric pain: Secondary | ICD-10-CM

## 2017-04-25 ENCOUNTER — Ambulatory Visit
Admission: RE | Admit: 2017-04-25 | Discharge: 2017-04-25 | Disposition: A | Discharge status: Home / Self Care | Payer: Medicare Other | Source: Ambulatory Visit | Attending: Physician Assistant | Admitting: Physician Assistant

## 2017-04-25 DIAGNOSIS — R1013 Epigastric pain: Secondary | ICD-10-CM

## 2017-04-25 DIAGNOSIS — R1011 Right upper quadrant pain: Secondary | ICD-10-CM

## 2017-04-25 DIAGNOSIS — R110 Nausea: Secondary | ICD-10-CM

## 2017-04-30 ENCOUNTER — Ambulatory Visit: Payer: Medicare Other | Admitting: Podiatry

## 2017-05-09 ENCOUNTER — Encounter: Payer: Self-pay | Admitting: Vascular Surgery

## 2017-05-09 ENCOUNTER — Ambulatory Visit (HOSPITAL_COMMUNITY)
Admission: RE | Admit: 2017-05-09 | Discharge: 2017-05-09 | Disposition: A | Discharge status: Home / Self Care | Payer: Medicare Other | Source: Ambulatory Visit | Attending: Vascular Surgery | Admitting: Vascular Surgery

## 2017-05-09 ENCOUNTER — Ambulatory Visit (INDEPENDENT_AMBULATORY_CARE_PROVIDER_SITE_OTHER): Payer: Medicare Other | Admitting: Vascular Surgery

## 2017-05-09 ENCOUNTER — Ambulatory Visit (INDEPENDENT_AMBULATORY_CARE_PROVIDER_SITE_OTHER)
Admission: RE | Admit: 2017-05-09 | Discharge: 2017-05-09 | Disposition: A | Discharge status: Home / Self Care | Payer: Medicare Other | Source: Ambulatory Visit | Attending: Vascular Surgery | Admitting: Vascular Surgery

## 2017-05-09 VITALS — BP 188/82 | HR 62 | Temp 98.0°F | Resp 16 | Ht 62.0 in | Wt 156.0 lb

## 2017-05-09 DIAGNOSIS — I70219 Atherosclerosis of native arteries of extremities with intermittent claudication, unspecified extremity: Secondary | ICD-10-CM

## 2017-05-09 DIAGNOSIS — R0989 Other specified symptoms and signs involving the circulatory and respiratory systems: Secondary | ICD-10-CM

## 2017-05-09 DIAGNOSIS — I6529 Occlusion and stenosis of unspecified carotid artery: Secondary | ICD-10-CM | POA: Diagnosis present

## 2017-05-09 LAB — VAS US CAROTID
LCCADSYS: -46 cm/s
LCCAPDIAS: -13 cm/s
LEFT ECA DIAS: -31 cm/s
LEFT VERTEBRAL DIAS: 18 cm/s
LICADSYS: -64 cm/s
Left CCA dist dias: -13 cm/s
Left CCA prox sys: -47 cm/s
Left ICA dist dias: -24 cm/s
Left ICA prox dias: -27 cm/s
Left ICA prox sys: -85 cm/s
RCCADSYS: -31 cm/s
RCCAPDIAS: -7 cm/s
RCCAPSYS: -62 cm/s
RIGHT CCA MID DIAS: 11 cm/s
RIGHT ECA DIAS: -18 cm/s
RIGHT VERTEBRAL DIAS: 20 cm/s

## 2017-05-09 NOTE — Progress Notes (Signed)
Patient name: Molly [name] MRN: [id number] DOB: [birth year] Sex: female  REASON FOR VISIT:   Follow-up of peripheral vascular disease.  HPI:   Molly [name] is a pleasant 67 y.o. female right seen in consultation on 05/05/2016 with claudication.  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  We discussed conservative treatment.  I recommended follow-up ABIs in 1 year.  In addition I heard bilateral carotid bruits and recommended a carotid duplex scan.  She wanted to discuss this with Dr. Wynonia Lawman before scheduling this.  Since I saw her last, she denies any claudication symptoms in either leg.  She denies rest pain or nonhealing ulcers.  She has been more active.  She continues to Writer.  She denies any history of stroke, TIAs, expressive or receptive aphasia, or amaurosis fugax.  Past Medical History:  Diagnosis Date  . Diabetes mellitus without complication ([place])   . Hypertension     No family history on file.  SOCIAL HISTORY: Social History   Tobacco Use  . Smoking status: Never Smoker  . Smokeless tobacco: Never Used  Substance Use Topics  . Alcohol use: No    Allergies  Allergen Reactions  . Mercury Nausea And Vomiting    Current Outpatient Medications  Medication Sig Dispense Refill  . amLODipine (NORVASC) 10 MG tablet Take 1 tablet (10 mg total) by mouth daily. 30 tablet 0  . aspirin EC 81 MG tablet Take 81 mg by mouth daily.    . cholecalciferol (VITAMIN D) 1000 units tablet Take 1,000 Units by mouth daily.  2  . cholestyramine (QUESTRAN) 4 g packet     . ciprofloxacin (CIPRO) 500 MG tablet Take 500 mg by mouth 2 (two) times daily. 1 tab x 5 days.    Marland Kitchen gabapentin (NEURONTIN) 100 MG capsule Take 100 mg by mouth 3 (three) times daily.    Marland Kitchen glimepiride (AMARYL) 4 MG tablet Take 4 mg by mouth daily with breakfast.    . insulin detemir (LEVEMIR) 100 UNIT/ML injection Inject into the skin at bedtime. 16-20 IU    .  nebivolol (BYSTOLIC) 10 MG tablet Take 10 mg by mouth daily.    Marland Kitchen omeprazole (PRILOSEC) 40 MG capsule Take 40 mg by mouth daily.    . ondansetron (ZOFRAN) 4 MG tablet Take 1 tablet (4 mg total) by mouth every 8 (eight) hours as needed for nausea or vomiting. 20 tablet 0  . rosuvastatin (CRESTOR) 5 MG tablet     . saccharomyces boulardii (FLORASTOR) 250 MG capsule Take 250 mg by mouth 2 (two) times daily.    Marland Kitchen spironolactone (ALDACTONE) 25 MG tablet Take 12.5 mg by mouth daily.      No current facility-administered medications for this visit.     REVIEW OF SYSTEMS:  [X]  denotes positive finding, [ ]  denotes negative finding Cardiac  Comments:  Chest pain or chest pressure:    Shortness of breath upon exertion:    Short of breath when lying flat:    Irregular heart rhythm:        Vascular    Pain in calf, thigh, or hip brought on by ambulation:    Pain in feet at night that wakes you up from your sleep:     Blood clot in your veins:    Leg swelling:         Pulmonary    Oxygen at home:    Productive cough:  Wheezing:         Neurologic    Sudden weakness in arms or legs:     Sudden numbness in arms or legs:     Sudden onset of difficulty speaking or slurred speech:    Temporary loss of vision in one eye:     Problems with dizziness:         Gastrointestinal    Blood in stool:     Vomited blood:         Genitourinary    Burning when urinating:     Blood in urine:        Psychiatric    Major depression:         Hematologic    Bleeding problems:    Problems with blood clotting too easily:        Skin    Rashes or ulcers:        Constitutional    Fever or chills:     PHYSICAL EXAM:   Vitals:   05/09/17 1144  BP: (!) 188/82  Pulse: 62  Resp: 16  Temp: 98 F (36.7 C)  TempSrc: Oral  SpO2: 98%  Weight: 156 lb (70.8 kg)  Height: 5\' 2"  (1.575 m)    GENERAL: The patient is a well-nourished female, in no acute distress. The vital signs are documented  above. CARDIAC: There is a regular rate and rhythm.  VASCULAR: She has bilateral carotid bruits. She has palpable femoral pulses.  I cannot palpate popliteal or pedal pulses on either side. She has no significant lower extremity swelling. PULMONARY: There is good air exchange bilaterally without wheezing or rales. ABDOMEN: Soft and non-tender with normal pitched bowel sounds.  MUSCULOSKELETAL: There are no major deformities or cyanosis. NEUROLOGIC: No focal weakness or paresthesias are detected. SKIN: There are no ulcers or rashes noted. PSYCHIATRIC: The patient has a normal affect.  DATA:    CAROTID DUPLEX: I have independently interpreted her carotid duplex scan.  On the right side there is no evidence of carotid stenosis.  On the left side there is a less than 39% stenosis.  The left external carotid artery does have a greater than 50% stenosis which likely explains her bruit.  Both vertebral arteries are patent with antegrade flow.  LOWER EXTREMITY ARTERIAL DOPPLER STUDY: I have independently interpreted her lower extremity arterial Doppler study.  On the right side she has a monophasic dorsalis pedis and posterior tibial signal.  ABI is 65%.  Toe pressure on the right is 60 mmHg.  On the left side there is a monophasic dorsalis pedis and posterior tibial signal.  ABI is 47%.  Toe pressure on the left is 36 mmHg.  MEDICAL ISSUES:   PERIPHERAL VASCULAR DISEASE: The patient does have significant infrainguinal arterial occlusive disease bilaterally.  Her ABIs have actually decreased compared to a year ago.  However she is completely asymptomatic.  She denies any history of claudication rest pain or nonhealing ulcers.  She is not a smoker.  I have encouraged her to stay as active as possible.  We have also discussed the importance of nutrition.  I do not think routine follow-up studies are needed given that she is asymptomatic.  I will be happy to see her back at any time if any new vascular  issues arise.  BILATERAL CAROTID BRUITS: Her carotid duplex scan shows no evidence of significant carotid disease on either side.  Her bruits are likely related to disease in the external carotid arteries.  She is asymptomatic.  She is on aspirin and is on a statin.  I do not think routine follow-up carotid duplex scans are necessary at this point.  Deitra Mayo [facility] [phone]

## 2017-05-29 ENCOUNTER — Encounter: Payer: Self-pay | Admitting: Podiatry

## 2017-05-29 ENCOUNTER — Ambulatory Visit: Payer: Medicare Other | Admitting: Podiatry

## 2017-05-29 DIAGNOSIS — M79674 Pain in right toe(s): Secondary | ICD-10-CM | POA: Diagnosis not present

## 2017-05-29 DIAGNOSIS — M205X2 Other deformities of toe(s) (acquired), left foot: Secondary | ICD-10-CM

## 2017-05-29 DIAGNOSIS — M79675 Pain in left toe(s): Secondary | ICD-10-CM | POA: Diagnosis not present

## 2017-05-29 DIAGNOSIS — B351 Tinea unguium: Secondary | ICD-10-CM | POA: Diagnosis not present

## 2017-05-29 DIAGNOSIS — E1142 Type 2 diabetes mellitus with diabetic polyneuropathy: Secondary | ICD-10-CM

## 2017-05-29 NOTE — Progress Notes (Signed)
Complaint:  Visit Type: Patient returns to my office for continued preventative foot care services. Complaint: Patient states" my nails have grown long and thick and become painful to walk and wear shoes" Patient has been diagnosed with DM with neuropathy.. The patient presents for preventative foot care services. No changes to ROS  Podiatric Exam: Vascular: dorsalis pedis and posterior tibial pulses are palpable bilateral. Capillary return is immediate. Temperature gradient is WNL. Skin turgor WNL  Sensorium: Normal Semmes Weinstein monofilament test left foot.  Diminished LOPS right foot.  Diminished  tactile sensation bilaterally. Nail Exam: Pt has thick disfigured discolored nails with subungual debris noted bilateral entire nail hallux through fifth toenails Ulcer Exam: There is no evidence of ulcer or pre-ulcerative changes or infection. Orthopedic Exam: Muscle tone and strength are WNL. No limitations in general ROM. No crepitus or effusions noted. Foot type and digits show no abnormalities.  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 Skin: No Porokeratosis. No infection or ulcers  Diagnosis:  Onychomycosis, , Pain in right toe, pain in left toes  Treatment & Plan Procedures and Treatment: Consent by patient was obtained for treatment procedures.   Debridement of mycotic and hypertrophic toenails, 1 through 5 bilateral and clearing of subungual debris. No ulceration, no infection noted.  Return Visit-Office Procedure: Patient instructed to return to the office for a follow up visit 3 months for continued evaluation and treatment.    Gardiner Barefoot DPM

## 2017-05-30 ENCOUNTER — Encounter (INDEPENDENT_AMBULATORY_CARE_PROVIDER_SITE_OTHER): Payer: Medicare Other | Admitting: Ophthalmology

## 2017-05-30 DIAGNOSIS — H43813 Vitreous degeneration, bilateral: Secondary | ICD-10-CM

## 2017-05-30 DIAGNOSIS — E11319 Type 2 diabetes mellitus with unspecified diabetic retinopathy without macular edema: Secondary | ICD-10-CM | POA: Diagnosis not present

## 2017-05-30 DIAGNOSIS — H26491 Other secondary cataract, right eye: Secondary | ICD-10-CM | POA: Diagnosis not present

## 2017-05-30 DIAGNOSIS — E113593 Type 2 diabetes mellitus with proliferative diabetic retinopathy without macular edema, bilateral: Secondary | ICD-10-CM

## 2017-05-30 DIAGNOSIS — H35033 Hypertensive retinopathy, bilateral: Secondary | ICD-10-CM | POA: Diagnosis not present

## 2017-05-30 DIAGNOSIS — I10 Essential (primary) hypertension: Secondary | ICD-10-CM

## 2017-06-06 ENCOUNTER — Encounter (INDEPENDENT_AMBULATORY_CARE_PROVIDER_SITE_OTHER): Payer: Medicare Other | Admitting: Ophthalmology

## 2017-06-06 DIAGNOSIS — H26491 Other secondary cataract, right eye: Secondary | ICD-10-CM | POA: Diagnosis not present

## 2017-06-15 ENCOUNTER — Encounter (INDEPENDENT_AMBULATORY_CARE_PROVIDER_SITE_OTHER): Payer: Medicare Other | Admitting: Ophthalmology

## 2017-06-15 DIAGNOSIS — H2701 Aphakia, right eye: Secondary | ICD-10-CM

## 2017-09-19 ENCOUNTER — Other Ambulatory Visit: Payer: Self-pay | Admitting: Internal Medicine

## 2017-09-19 DIAGNOSIS — R0989 Other specified symptoms and signs involving the circulatory and respiratory systems: Secondary | ICD-10-CM

## 2017-09-25 ENCOUNTER — Ambulatory Visit
Admission: RE | Admit: 2017-09-25 | Discharge: 2017-09-25 | Disposition: A | Discharge status: Home / Self Care | Payer: Medicare Other | Source: Ambulatory Visit | Attending: Internal Medicine | Admitting: Internal Medicine

## 2017-09-25 DIAGNOSIS — R0989 Other specified symptoms and signs involving the circulatory and respiratory systems: Secondary | ICD-10-CM

## 2017-09-26 ENCOUNTER — Ambulatory Visit: Payer: Medicare Other | Admitting: Podiatry

## 2017-10-26 ENCOUNTER — Encounter: Payer: Self-pay | Admitting: Podiatry

## 2017-10-26 ENCOUNTER — Ambulatory Visit: Payer: Medicare Other | Admitting: Podiatry

## 2017-10-26 DIAGNOSIS — E1142 Type 2 diabetes mellitus with diabetic polyneuropathy: Secondary | ICD-10-CM

## 2017-10-26 DIAGNOSIS — B351 Tinea unguium: Secondary | ICD-10-CM | POA: Diagnosis not present

## 2017-10-26 DIAGNOSIS — M79674 Pain in right toe(s): Secondary | ICD-10-CM

## 2017-10-26 DIAGNOSIS — S81802A Unspecified open wound, left lower leg, initial encounter: Secondary | ICD-10-CM

## 2017-10-26 DIAGNOSIS — M79675 Pain in left toe(s): Secondary | ICD-10-CM | POA: Diagnosis not present

## 2017-10-28 NOTE — Progress Notes (Signed)
Subjective: 67 y.o. returns the office today for painful, elongated, thickened toenails which she cannot trim herself. Denies any redness or drainage around the nails. Denies any acute changes since last appointment and no new complaints today. Denies any systemic complaints such as fevers, chills, nausea, vomiting.   PCP: [name], MD  Objective: AAO 3, NAD DP/PT pulses palpable, CRT less than 3 seconds Nails hypertrophic, dystrophic, elongated, brittle, discolored 10. There is tenderness overlying the nails 1-5 bilaterally. There is no surrounding erythema or drainage along the nail sites. Superficial wound present to the anterior aspect the left leg from where she states that she hit it.  This is been all over the last couple months she states is getting better.  He has been putting antibiotic ointment and a bandage.  There is no surrounding erythema, ascending sialitis.  There is no fluctuation or crepitation or malodor. No open lesions or pre-ulcerative lesions are identified. No other areas of tenderness bilateral lower extremities. No overlying edema, erythema, increased warmth. No pain with calf compression, swelling, warmth, erythema.  Assessment: Patient presents with symptomatic onychomycosis; left anterior leg wound  Plan: -Treatment options including alternatives, risks, complications were discussed -Nails sharply debrided 10 without complication/bleeding. -Discussed continuing antibiotic ointment dressing changes daily.  She can wash the area with soap and water.  Monitoring signs or symptoms of infection there is not healed the next couple weeks to let me know or sooner if any issues are to arise. -Discussed daily foot inspection. If there are any changes, to call the office immediately.  -Follow-up in 3 months or sooner if any problems are to arise. In the meantime, encouraged to call the office with any questions, concerns, changes symptoms.  Celesta Gentile,  DPM

## 2017-12-19 ENCOUNTER — Encounter (INDEPENDENT_AMBULATORY_CARE_PROVIDER_SITE_OTHER): Payer: Medicare Other | Admitting: Ophthalmology

## 2017-12-19 DIAGNOSIS — E113511 Type 2 diabetes mellitus with proliferative diabetic retinopathy with macular edema, right eye: Secondary | ICD-10-CM

## 2017-12-19 DIAGNOSIS — E11319 Type 2 diabetes mellitus with unspecified diabetic retinopathy without macular edema: Secondary | ICD-10-CM

## 2017-12-19 DIAGNOSIS — I10 Essential (primary) hypertension: Secondary | ICD-10-CM | POA: Diagnosis not present

## 2017-12-19 DIAGNOSIS — H35033 Hypertensive retinopathy, bilateral: Secondary | ICD-10-CM

## 2017-12-19 DIAGNOSIS — H43813 Vitreous degeneration, bilateral: Secondary | ICD-10-CM

## 2017-12-19 DIAGNOSIS — E113592 Type 2 diabetes mellitus with proliferative diabetic retinopathy without macular edema, left eye: Secondary | ICD-10-CM

## 2018-01-29 ENCOUNTER — Ambulatory Visit: Payer: Medicare Other | Admitting: Podiatry

## 2018-02-07 ENCOUNTER — Ambulatory Visit: Payer: Medicare Other | Admitting: Podiatry

## 2018-02-07 DIAGNOSIS — B351 Tinea unguium: Secondary | ICD-10-CM

## 2018-02-07 DIAGNOSIS — M79675 Pain in left toe(s): Secondary | ICD-10-CM | POA: Diagnosis not present

## 2018-02-07 DIAGNOSIS — M79674 Pain in right toe(s): Secondary | ICD-10-CM

## 2018-02-07 DIAGNOSIS — E1142 Type 2 diabetes mellitus with diabetic polyneuropathy: Secondary | ICD-10-CM

## 2018-02-10 NOTE — Progress Notes (Signed)
Subjective: 67 y.o. returns the office today for painful, elongated, thickened toenails which she cannot trim herself. Denies any redness or drainage around the nails. Denies any acute changes since last appointment and no new complaints today. Denies any systemic complaints such as fevers, chills, nausea, vomiting.   PCP: [name], MD  Objective: AAO 3, NAD DP/PT pulses palpable, CRT less than 3 seconds Nails hypertrophic, dystrophic, elongated, brittle, discolored 10. There is tenderness overlying the nails 1-5 bilaterally. There is no surrounding erythema or drainage along the nail sites. Wound on the left leg appears to be healed.  No open lesions or pre-ulcerative lesions are identified. No other areas of tenderness bilateral lower extremities. No overlying edema, erythema, increased warmth. No pain with calf compression, swelling, warmth, erythema.  Assessment: Patient presents with symptomatic onychomycosis; left anterior leg wound  Plan: -Treatment options including alternatives, risks, complications were discussed -Nails sharply debrided 10 without complication/bleeding. -Discussed daily foot inspection. If there are any changes, to call the office immediately.  -Follow-up in 3 months or sooner if any problems are to arise. In the meantime, encouraged to call the office with any questions, concerns, changes symptoms.  Celesta Gentile, DPM

## 2018-02-19 ENCOUNTER — Ambulatory Visit: Payer: Medicare Other | Admitting: Cardiology

## 2018-02-19 ENCOUNTER — Encounter: Payer: Self-pay | Admitting: Cardiology

## 2018-02-19 VITALS — BP 160/80 | HR 61 | Ht 62.0 in | Wt 153.1 lb

## 2018-02-19 DIAGNOSIS — I6529 Occlusion and stenosis of unspecified carotid artery: Secondary | ICD-10-CM | POA: Diagnosis not present

## 2018-02-19 DIAGNOSIS — I10 Essential (primary) hypertension: Secondary | ICD-10-CM | POA: Diagnosis not present

## 2018-02-19 DIAGNOSIS — I700 Atherosclerosis of aorta: Secondary | ICD-10-CM

## 2018-02-19 NOTE — Progress Notes (Signed)
Cardiology Office Note:    Date:  02/19/2018   ID:  Molly [name], DOB 1950/05/25, MRN [id number]  PCP:  Molly Cha, MD  Cardiologist:  Molly Campus, MD    Referring MD: Molly [name],*   Chief Complaint  Patient presents with  . Follow-up  Doing fine denies having any issues except for blood pressure problem  History of Present Illness:    Molly [name] is a 67 y.o. female with complex past medical history which include diabetes which is poorly controlled because of her noncompliance, hypertension.  She denies having any chest pain tightness squeezing pressure burning chest does have some shortness of breath with exertion.  Recently she got a new dog and she tried to walk her dog on the regular basis which seem to be helping.  She admits to do a lot of dietary mistakes she lives sweet tea and she drinks a lot of sweet tea.  There is a problem with blood pressure today blood pressure was 606 systolic and she said that is unusually good.  She remember medication called lisinopril which gave her some problem with her her she started losing her hair and she does not want to go back on the medication I see some documentation that she was taking ARB before however she is not on any of this medication on my list.  Clearly she can benefit from it.  In the future we may be forced to do a renal duplex to rule out any significant renal artery stenosis.  Past Medical History:  Diagnosis Date  . Diabetes mellitus without complication ([place])   . Hypertension     Past Surgical History:  Procedure Laterality Date  . CATARACT EXTRACTION, BILATERAL    . CHOLECYSTECTOMY N/A 06/30/2015   Procedure: LAPAROSCOPIC CHOLECYSTECTOMY WITH INTRAOPERATIVE CHOLANGIOGRAM;  Surgeon: Molly Mesa, MD;  Location: [place];  Service: General;  Laterality: N/A;    Current Medications: Current Meds  Medication Sig  . amLODipine (NORVASC) 10 MG tablet Take 1 tablet (10 mg total) by mouth daily.  Marland Kitchen  aspirin EC 81 MG tablet Take 81 mg by mouth daily.  . cholecalciferol (VITAMIN D) 1000 units tablet Take 1,000 Units by mouth daily.  Marland Kitchen gabapentin (NEURONTIN) 100 MG capsule Take 100 mg by mouth 3 (three) times daily.  . metoprolol succinate (TOPROL-XL) 50 MG 24 hr tablet Take 1 tablet by mouth daily.  . rosuvastatin (CRESTOR) 5 MG tablet   . saccharomyces boulardii (FLORASTOR) 250 MG capsule Take 250 mg by mouth 2 (two) times daily.  Marland Kitchen spironolactone (ALDACTONE) 25 MG tablet Take 12.5 mg by mouth daily.      Allergies:   Mercury   Social History   Socioeconomic History  . Marital status: Divorced    Spouse name: Not on file  . Number of children: Not on file  . Years of education: Not on file  . Highest education level: Not on file  Occupational History  . Not on file  Social Needs  . Financial resource strain: Not on file  . Food insecurity:    Worry: Not on file    Inability: Not on file  . Transportation needs:    Medical: Not on file    Non-medical: Not on file  Tobacco Use  . Smoking status: Never Smoker  . Smokeless tobacco: Never Used  Substance and Sexual Activity  . Alcohol use: No  . Drug use: Never  . Sexual activity: Not on file  Lifestyle  . Physical  activity:    Days per week: Not on file    Minutes per session: Not on file  . Stress: Not on file  Relationships  . Social connections:    Talks on phone: Not on file    Gets together: Not on file    Attends religious service: Not on file    Active member of club or organization: Not on file    Attends meetings of clubs or organizations: Not on file    Relationship status: Not on file  Other Topics Concern  . Not on file  Social History Narrative  . Not on file     Family History: The patient's family history includes Breast cancer in her mother; Hypertension in her father and mother. ROS:   Please see the history of present illness.    All 14 point review of systems negative except as described  per history of present illness  EKGs/Labs/Other Studies Reviewed:      Recent Labs: No results found for requested labs within last 8760 hours.  Recent Lipid Panel No results found for: CHOL, TRIG, HDL, CHOLHDL, VLDL, LDLCALC, LDLDIRECT  Physical Exam:    VS:  BP (!) 160/80   Pulse 61   Ht 5\' 2"  (1.575 m)   Wt 153 lb 1.9 oz (69.5 kg)   SpO2 98%   BMI 28.01 kg/m     Wt Readings from Last 3 Encounters:  02/19/18 153 lb 1.9 oz (69.5 kg)  05/09/17 156 lb (70.8 kg)  05/05/16 145 lb (65.8 kg)     GEN:  Well nourished, well developed in no acute distress HEENT: Normal NECK: No JVD; No carotid bruits LYMPHATICS: No lymphadenopathy CARDIAC: RRR, no murmurs, no rubs, no gallops RESPIRATORY:  Clear to auscultation without rales, wheezing or rhonchi  ABDOMEN: Soft, non-tender, non-distended MUSCULOSKELETAL:  No edema; No deformity  SKIN: Warm and dry LOWER EXTREMITIES: no swelling NEUROLOGIC:  Alert and oriented x 3 PSYCHIATRIC:  Normal affect   ASSESSMENT:    1. Carotid stenosis, asymptomatic, unspecified laterality   2. Abdominal aortic atherosclerosis with stenosis   3. Essential hypertension    PLAN:    In order of problems listed above:  1. Carotid artery stenosis.  Last carotid ultrasound was done in the summer which showed moderate stenosis both side none of this required any intervention right now except risk factors modification which include improvement in her diabetes control. 2. Essential hypertension I will check Chem-7.  Based on that we will decide if we can start ARB on her. 3. Abdominal atherosclerosis noted.  No symptoms.  The key will be to modify her risk factors.   Medication Adjustments/Labs and Tests Ordered: Current medicines are reviewed at length with the patient today.  Concerns regarding medicines are outlined above.  No orders of the defined types were placed in this encounter.  Medication changes: No orders of the defined types were placed  in this encounter.   Signed, Molly Liter, MD, [facility] 02/19/2018 5:16 PM    [name]

## 2018-02-19 NOTE — Patient Instructions (Signed)
Medication Instructions:  Your physician recommends that you continue on your current medications as directed. Please refer to the Current Medication list given to you today.  If you need a refill on your cardiac medications before your next appointment, please call your pharmacy.   Lab work: None.  If you have labs (blood work) drawn today and your tests are completely normal, you will receive your results only by: Marland Kitchen MyChart Message (if you have MyChart) OR . A paper copy in the mail If you have any lab test that is abnormal or we need to change your treatment, we will call you to review the results.  Testing/Procedures: Your physician has requested that you have a carotid duplex. This test is an ultrasound of the carotid arteries in your neck. It looks at blood flow through these arteries that supply the brain with blood. Allow one hour for this exam. There are no restrictions or special instructions.   Follow-Up: At [facility], you and your health needs are our priority.  As part of our continuing mission to provide you with exceptional heart care, we have created designated Provider Care Teams.  These Care Teams include your primary Cardiologist (physician) and Advanced Practice Providers (APPs -  Physician Assistants and Nurse Practitioners) who all work together to provide you with the care you need, when you need it. You will need a follow up appointment in 1 months.  Please call our office 2 months in advance to schedule this appointment.  You may see No primary care provider on file. or another member of our [employer] Provider Team in [place]: Shirlee More, MD . Jyl Heinz, MD  Any Other Special Instructions Will Be Listed Below (If Applicable).

## 2018-02-25 ENCOUNTER — Telehealth: Payer: Self-pay | Admitting: Cardiology

## 2018-02-25 ENCOUNTER — Telehealth: Payer: Self-pay | Admitting: *Deleted

## 2018-02-25 NOTE — Telephone Encounter (Signed)
Pt called to let you know she has enough medicine to last her until her next visit with Dr. Agustin Cree.

## 2018-02-25 NOTE — Telephone Encounter (Signed)
Patient will call tomorrow to let us know if she has enough meds to last her to her appt with RJK.

## 2018-02-25 NOTE — Telephone Encounter (Signed)
° ° ° °  1. Which medications need to be refilled? (please list name of each medication and dose if known) spironolactone 25mg  tablets one daily  2. Which pharmacy/location (including street and city if local pharmacy) is medication to be sent to?[facility], [address] [place]  3. Do they need a 30 day or 90 day supply? [place]

## 2018-02-27 NOTE — Progress Notes (Signed)
Per Estill Bamberg, CMA patient doesn't need a carotid ultrasound after consulting with Dr. Agustin Cree because she just recently had one done. Will remove this order once appointment is cancelled.

## 2018-03-06 ENCOUNTER — Ambulatory Visit (HOSPITAL_BASED_OUTPATIENT_CLINIC_OR_DEPARTMENT_OTHER): Payer: Medicare Other

## 2018-04-04 ENCOUNTER — Encounter: Payer: Self-pay | Admitting: Cardiology

## 2018-04-04 ENCOUNTER — Ambulatory Visit (INDEPENDENT_AMBULATORY_CARE_PROVIDER_SITE_OTHER): Payer: Medicare Other | Admitting: Cardiology

## 2018-04-04 VITALS — BP 124/76 | HR 56 | Ht 62.0 in | Wt 157.8 lb

## 2018-04-04 DIAGNOSIS — I10 Essential (primary) hypertension: Secondary | ICD-10-CM | POA: Diagnosis not present

## 2018-04-04 DIAGNOSIS — I700 Atherosclerosis of aorta: Secondary | ICD-10-CM | POA: Diagnosis not present

## 2018-04-04 NOTE — Progress Notes (Signed)
Cardiology Office Note:    Date:  04/04/2018   ID:  Molly [name], DOB [birth year], MRN [id number]  PCP:  [name], MD  Cardiologist:  Jenne Campus, MD    Referring MD: [name],*   Chief Complaint  Patient presents with  . 1 month follow up  Doing well  History of Present Illness:    Molly [name] is a 67 y.o. female with multiple medical problems.  Recently she was sent to pediatric ophthalmologist because of some problem with the motion of her eye there was some suspicion for both myasthenia gravis she had some extensive evaluation done apparently came negative then ophthalmologist told her she need to see cardiology because he saw something in her eye did suggest heart problem she told her that he required electrocardiogram.  Denies have any chest pain tightness squeezing pressure burning chest  Past Medical History:  Diagnosis Date  . Diabetes mellitus without complication ([place])   . Hypertension     Past Surgical History:  Procedure Laterality Date  . CATARACT EXTRACTION, BILATERAL    . CHOLECYSTECTOMY N/A 06/30/2015   Procedure: LAPAROSCOPIC CHOLECYSTECTOMY WITH INTRAOPERATIVE CHOLANGIOGRAM;  Surgeon: Donnie Mesa, MD;  Location: [place];  Service: General;  Laterality: N/A;    Current Medications: Current Meds  Medication Sig  . amLODipine (NORVASC) 10 MG tablet Take 1 tablet (10 mg total) by mouth daily.  Marland Kitchen aspirin EC 81 MG tablet Take 81 mg by mouth daily.  . cholecalciferol (VITAMIN D) 1000 units tablet Take 1,000 Units by mouth daily.  Marland Kitchen gabapentin (NEURONTIN) 100 MG capsule Take 100 mg by mouth 3 (three) times daily.  . Insulin Glargine-Lixisenatide (SOLIQUA) 100-33 UNT-MCG/ML SOPN Inject 15 Units into the skin daily before breakfast.  . metoprolol succinate (TOPROL-XL) 50 MG 24 hr tablet Take 1 tablet by mouth daily.  . Pediatric Multivit-Minerals-C (FLINTSTONES COMPLETE PO) Take by mouth.  . rosuvastatin (CRESTOR) 5 MG tablet   .  saccharomyces boulardii (FLORASTOR) 250 MG capsule Take 250 mg by mouth 2 (two) times daily.  Marland Kitchen spironolactone (ALDACTONE) 25 MG tablet Take 12.5 mg by mouth daily.      Allergies:   Mercury   Social History   Socioeconomic History  . Marital status: Divorced    Spouse name: Not on file  . Number of children: Not on file  . Years of education: Not on file  . Highest education level: Not on file  Occupational History  . Not on file  Social Needs  . Financial resource strain: Not on file  . Food insecurity:    Worry: Not on file    Inability: Not on file  . Transportation needs:    Medical: Not on file    Non-medical: Not on file  Tobacco Use  . Smoking status: Never Smoker  . Smokeless tobacco: Never Used  Substance and Sexual Activity  . Alcohol use: No  . Drug use: Never  . Sexual activity: Not on file  Lifestyle  . Physical activity:    Days per week: Not on file    Minutes per session: Not on file  . Stress: Not on file  Relationships  . Social connections:    Talks on phone: Not on file    Gets together: Not on file    Attends religious service: Not on file    Active member of club or organization: Not on file    Attends meetings of clubs or organizations: Not on file    Relationship  status: Not on file  Other Topics Concern  . Not on file  Social History Narrative  . Not on file     Family History: The patient's family history includes Breast cancer in her mother; Hypertension in her father and mother. ROS:   Please see the history of present illness.    All 14 point review of systems negative except as described per history of present illness  EKGs/Labs/Other Studies Reviewed:      Recent Labs: No results found for requested labs within last 8760 hours.  Recent Lipid Panel No results found for: CHOL, TRIG, HDL, CHOLHDL, VLDL, LDLCALC, LDLDIRECT  Physical Exam:    VS:  BP 124/76   Pulse (!) 56   Ht 5\' 2"  (1.575 m)   Wt 157 lb 12.8 oz (71.6  kg)   SpO2 98%   BMI 28.86 kg/m     Wt Readings from Last 3 Encounters:  04/04/18 157 lb 12.8 oz (71.6 kg)  02/19/18 153 lb 1.9 oz (69.5 kg)  05/09/17 156 lb (70.8 kg)     GEN:  Well nourished, well developed in no acute distress HEENT: Normal NECK: No JVD; No carotid bruits LYMPHATICS: No lymphadenopathy CARDIAC: RRR, no murmurs, no rubs, no gallops RESPIRATORY:  Clear to auscultation without rales, wheezing or rhonchi  ABDOMEN: Soft, non-tender, non-distended MUSCULOSKELETAL:  No edema; No deformity  SKIN: Warm and dry LOWER EXTREMITIES: no swelling NEUROLOGIC:  Alert and oriented x 3 PSYCHIATRIC:  Normal affect   ASSESSMENT:    1. Essential hypertension   2. Abdominal aortic atherosclerosis with stenosis    PLAN:    In order of problems listed above:  1. Essential hypertension blood pressure well controlled continue present management. 2. Diffuse atherosclerosis I will check a Chem-7 to maximize therapy if needed, I will also ask you to have Chem-7 to see if we can put her on ARB or ACE inhibitor which will be beneficial for her diabetes. 3. I contact Dr. Annamaria Boots, office number was [phone] and inquire about recommendation for cardiac check.  He is nurse reviewed the chart there was nothing about it I will try to talk to him again.   Medication Adjustments/Labs and Tests Ordered: Current medicines are reviewed at length with the patient today.  Concerns regarding medicines are outlined above.  Orders Placed This Encounter  Procedures  . Basic metabolic panel  . Lipid panel   Medication changes: No orders of the defined types were placed in this encounter.   Signed, Park Liter, MD, [facility] 04/04/2018 3:37 PM    [facility]

## 2018-04-04 NOTE — Patient Instructions (Signed)
Medication Instructions:  Your physician recommends that you continue on your current medications as directed. Please refer to the Current Medication list given to you today.  If you need a refill on your cardiac medications before your next appointment, please call your pharmacy.   Lab work: Your physician recommends that you return for lab work today: Bmp, lipids  If you have labs (blood work) drawn today and your tests are completely normal, you will receive your results only by: Marland Kitchen MyChart Message (if you have MyChart) OR . A paper copy in the mail If you have any lab test that is abnormal or we need to change your treatment, we will call you to review the results.  Testing/Procedures: None.   Follow-Up: At [facility], you and your health needs are our priority.  As part of our continuing mission to provide you with exceptional heart care, we have created designated Provider Care Teams.  These Care Teams include your primary Cardiologist (physician) and Advanced Practice Providers (APPs -  Physician Assistants and Nurse Practitioners) who all work together to provide you with the care you need, when you need it. You will need a follow up appointment in 3 months.  Please call our office 2 months in advance to schedule this appointment.  You may see No primary care provider on file. or another member of our [employer] Provider Team in [place]: Shirlee More, MD . Jyl Heinz, MD  Any Other Special Instructions Will Be Listed Below (If Applicable).

## 2018-04-05 ENCOUNTER — Telehealth: Payer: Self-pay | Admitting: Emergency Medicine

## 2018-04-05 DIAGNOSIS — I6529 Occlusion and stenosis of unspecified carotid artery: Secondary | ICD-10-CM

## 2018-04-05 DIAGNOSIS — I700 Atherosclerosis of aorta: Secondary | ICD-10-CM

## 2018-04-05 DIAGNOSIS — I10 Essential (primary) hypertension: Secondary | ICD-10-CM

## 2018-04-05 LAB — LIPID PANEL
CHOL/HDL RATIO: 3.1 ratio (ref 0.0–4.4)
Cholesterol, Total: 154 mg/dL (ref 100–199)
HDL: 50 mg/dL (ref >39)
LDL CALC: 86 mg/dL (ref 0–99)
TRIGLYCERIDES: 88 mg/dL (ref 0–149)
VLDL Cholesterol Cal: 18 mg/dL (ref 5–40)

## 2018-04-05 LAB — BASIC METABOLIC PANEL
BUN / CREAT RATIO: 29 — AB (ref 12–28)
BUN: 26 mg/dL (ref 8–27)
CO2: 25 mmol/L (ref 20–29)
CREATININE: 0.91 mg/dL (ref 0.57–1.00)
Calcium: 9 mg/dL (ref 8.7–10.3)
Chloride: 104 mmol/L (ref 96–106)
GFR, EST AFRICAN AMERICAN: 77 mL/min/{1.73_m2} (ref >59)
GFR, EST NON AFRICAN AMERICAN: 66 mL/min/{1.73_m2} (ref >59)
Glucose: 121 mg/dL — ABNORMAL HIGH (ref 65–99)
POTASSIUM: 5.3 mmol/L — AB (ref 3.5–5.2)
SODIUM: 143 mmol/L (ref 134–144)

## 2018-04-05 MED ORDER — ROSUVASTATIN CALCIUM 10 MG PO TABS: 10.0000 mg | ORAL_TABLET | Freq: Every day | ORAL | 1 refills | Status: DC

## 2018-04-05 NOTE — Addendum Note (Signed)
Addended by: Aleatha Borer on: 04/05/2018 05:38 PM   Modules accepted: Orders

## 2018-04-05 NOTE — Telephone Encounter (Signed)
Patient schedule for 7 day holter monitor per Dr. Agustin Cree. She is aware of appointment

## 2018-04-05 NOTE — Addendum Note (Signed)
Addended by: Ashok Norris on: 04/05/2018 05:19 PM   Modules accepted: Orders

## 2018-04-05 NOTE — Telephone Encounter (Signed)
Patient advised to stop spironolactone, increase crestor to 10 mg daily, and have bmp checked in 1 week and lipid profile checked in 6 weeks. She verbally understands.

## 2018-04-09 ENCOUNTER — Ambulatory Visit (INDEPENDENT_AMBULATORY_CARE_PROVIDER_SITE_OTHER): Payer: Medicare Other

## 2018-04-09 ENCOUNTER — Other Ambulatory Visit: Payer: Self-pay | Admitting: Cardiology

## 2018-04-09 DIAGNOSIS — I10 Essential (primary) hypertension: Secondary | ICD-10-CM

## 2018-04-09 DIAGNOSIS — R002 Palpitations: Secondary | ICD-10-CM | POA: Diagnosis not present

## 2018-04-09 DIAGNOSIS — I6529 Occlusion and stenosis of unspecified carotid artery: Secondary | ICD-10-CM

## 2018-04-09 NOTE — Addendum Note (Signed)
Addended by: Jerl Santos R on: 04/09/2018 09:03 AM   Modules accepted: Orders

## 2018-04-13 LAB — LIPID PANEL
CHOLESTEROL TOTAL: 152 mg/dL (ref 100–199)
Chol/HDL Ratio: 3.3 ratio (ref 0.0–4.4)
HDL: 46 mg/dL (ref >39)
LDL Calculated: 77 mg/dL (ref 0–99)
Triglycerides: 143 mg/dL (ref 0–149)
VLDL CHOLESTEROL CAL: 29 mg/dL (ref 5–40)

## 2018-04-13 LAB — ALT: ALT: 24 IU/L (ref 0–32)

## 2018-04-13 LAB — BASIC METABOLIC PANEL
BUN / CREAT RATIO: 23 (ref 12–28)
BUN: 23 mg/dL (ref 8–27)
CHLORIDE: 101 mmol/L (ref 96–106)
CO2: 24 mmol/L (ref 20–29)
Calcium: 9.4 mg/dL (ref 8.7–10.3)
Creatinine, Ser: 0.99 mg/dL (ref 0.57–1.00)
GFR calc non Af Amer: 60 mL/min/{1.73_m2} (ref >59)
GFR, EST AFRICAN AMERICAN: 69 mL/min/{1.73_m2} (ref >59)
GLUCOSE: 101 mg/dL — AB (ref 65–99)
POTASSIUM: 5.4 mmol/L — AB (ref 3.5–5.2)
SODIUM: 141 mmol/L (ref 134–144)

## 2018-04-13 LAB — AST: AST: 28 IU/L (ref 0–40)

## 2018-04-16 ENCOUNTER — Telehealth: Payer: Self-pay | Admitting: Emergency Medicine

## 2018-04-16 DIAGNOSIS — I10 Essential (primary) hypertension: Secondary | ICD-10-CM

## 2018-04-16 MED ORDER — HYDROCHLOROTHIAZIDE 12.5 MG PO CAPS: 12.5000 mg | ORAL_CAPSULE | Freq: Every day | ORAL | 0 refills | Status: DC

## 2018-04-16 NOTE — Telephone Encounter (Signed)
Patient informed of lab results, patient informed to start hydrochlorothiazide 12.5 mg daily and have labs redrawn in 1 week. Patient verbally understands.

## 2018-04-30 ENCOUNTER — Telehealth: Payer: Self-pay | Admitting: Cardiology

## 2018-04-30 LAB — BASIC METABOLIC PANEL
BUN / CREAT RATIO: 30 — AB (ref 12–28)
BUN: 36 mg/dL — AB (ref 8–27)
CHLORIDE: 98 mmol/L (ref 96–106)
CO2: 27 mmol/L (ref 20–29)
Calcium: 8.8 mg/dL (ref 8.7–10.3)
Creatinine, Ser: 1.21 mg/dL — ABNORMAL HIGH (ref 0.57–1.00)
GFR, EST AFRICAN AMERICAN: 54 mL/min/{1.73_m2} — AB (ref >59)
GFR, EST NON AFRICAN AMERICAN: 47 mL/min/{1.73_m2} — AB (ref >59)
Glucose: 137 mg/dL — ABNORMAL HIGH (ref 65–99)
Potassium: 4.7 mmol/L (ref 3.5–5.2)
Sodium: 139 mmol/L (ref 134–144)

## 2018-04-30 NOTE — Telephone Encounter (Signed)
Monitor showed some apcs and pvcs  Short SVT  Medical tharpapy chsm 7 - creat mildly up but not bad, cont same

## 2018-04-30 NOTE — Telephone Encounter (Signed)
Patient informed of lab results. 

## 2018-04-30 NOTE — Telephone Encounter (Signed)
Patient is asking for lab and monitor results.

## 2018-05-10 ENCOUNTER — Encounter: Payer: Self-pay | Admitting: Podiatry

## 2018-05-10 ENCOUNTER — Ambulatory Visit: Payer: Medicare Other | Admitting: Podiatry

## 2018-05-10 DIAGNOSIS — B351 Tinea unguium: Secondary | ICD-10-CM

## 2018-05-10 DIAGNOSIS — M79675 Pain in left toe(s): Secondary | ICD-10-CM

## 2018-05-10 DIAGNOSIS — M79674 Pain in right toe(s): Secondary | ICD-10-CM

## 2018-05-10 DIAGNOSIS — E1142 Type 2 diabetes mellitus with diabetic polyneuropathy: Secondary | ICD-10-CM

## 2018-05-10 DIAGNOSIS — M7662 Achilles tendinitis, left leg: Secondary | ICD-10-CM

## 2018-05-10 MED ORDER — DICLOFENAC SODIUM 1 % TD GEL: 2.0000 g | Freq: Four times a day (QID) | TRANSDERMAL | 1 refills | Status: DC

## 2018-05-10 NOTE — Patient Instructions (Signed)

## 2018-05-14 NOTE — Progress Notes (Signed)
.  Subjective: 68 y.o. returns the office today for painful, elongated, thickened toenails which she cannot trim herself. Denies any redness or drainage around the nails.  She also states that she has been some pain on the left Achilles tendon.  She denies any recent injury or trauma to the area she denies any swelling or redness.  She said no recent treatment.  Denies any acute changes since last appointment and no new complaints today. Denies any systemic complaints such as fevers, chills, nausea, vomiting.   PCP: [name], MD  Objective: AAO 3, NAD DP/PT pulses palpable, CRT less than 3 seconds Nails hypertrophic, dystrophic, elongated, brittle, discolored 10. There is tenderness overlying the nails 1-5 bilaterally. There is no surrounding erythema or drainage along the nail sites. No open lesions or pre-ulcerative lesions are identified. There is mild tenderness palpation of the course of the Achilles tendon but there is no defect noted and Thompson test is negative.  No pain with lateral compression of calcaneus.  No pain to the plantar heel.  No other areas of tenderness. No pain with calf compression, swelling, warmth, erythema.  Assessment: Patient presents with symptomatic onychomycosis; left Achilles tendinitis  Plan: -Treatment options including alternatives, risks, complications were discussed -Nails sharply debrided 10 without complication/bleeding. -Regards the tenderness we discussed a heel lift which I dispensed today.  Try Voltaren gel.  Discussed traction, icing exercises daily. -Discussed daily foot inspection. If there are any changes, to call the office immediately.  -Follow-up in 3 months for routine care or if the Achilles tendon is not improving next couple weeks to let me know or sooner if any problems are to arise. In the meantime, encouraged to call the office with any questions, concerns, changes symptoms.  Celesta Gentile, DPM

## 2018-06-21 ENCOUNTER — Encounter (INDEPENDENT_AMBULATORY_CARE_PROVIDER_SITE_OTHER): Payer: Medicare Other | Admitting: Ophthalmology

## 2018-06-21 DIAGNOSIS — H4312 Vitreous hemorrhage, left eye: Secondary | ICD-10-CM

## 2018-06-21 DIAGNOSIS — I10 Essential (primary) hypertension: Secondary | ICD-10-CM | POA: Diagnosis not present

## 2018-06-21 DIAGNOSIS — E113593 Type 2 diabetes mellitus with proliferative diabetic retinopathy without macular edema, bilateral: Secondary | ICD-10-CM | POA: Diagnosis not present

## 2018-06-21 DIAGNOSIS — E11319 Type 2 diabetes mellitus with unspecified diabetic retinopathy without macular edema: Secondary | ICD-10-CM | POA: Diagnosis not present

## 2018-06-21 DIAGNOSIS — H35033 Hypertensive retinopathy, bilateral: Secondary | ICD-10-CM

## 2018-07-09 ENCOUNTER — Ambulatory Visit: Payer: Medicare Other | Admitting: Cardiology

## 2018-07-11 ENCOUNTER — Telehealth: Payer: Self-pay | Admitting: Emergency Medicine

## 2018-07-11 ENCOUNTER — Other Ambulatory Visit: Payer: Self-pay | Admitting: Cardiology

## 2018-07-11 NOTE — Telephone Encounter (Signed)
Left message for patient to return call regarding switching visit to a televisit.

## 2018-07-15 ENCOUNTER — Encounter: Payer: Self-pay | Admitting: Cardiology

## 2018-07-15 ENCOUNTER — Telehealth: Payer: Self-pay | Admitting: Emergency Medicine

## 2018-07-15 ENCOUNTER — Other Ambulatory Visit: Payer: Self-pay

## 2018-07-15 ENCOUNTER — Telehealth (INDEPENDENT_AMBULATORY_CARE_PROVIDER_SITE_OTHER): Payer: Medicare Other | Admitting: Cardiology

## 2018-07-15 VITALS — BP 161/83 | HR 55 | Ht 62.0 in | Wt 151.2 lb

## 2018-07-15 DIAGNOSIS — E119 Type 2 diabetes mellitus without complications: Secondary | ICD-10-CM

## 2018-07-15 DIAGNOSIS — E1122 Type 2 diabetes mellitus with diabetic chronic kidney disease: Secondary | ICD-10-CM | POA: Insufficient documentation

## 2018-07-15 DIAGNOSIS — I10 Essential (primary) hypertension: Secondary | ICD-10-CM | POA: Diagnosis not present

## 2018-07-15 DIAGNOSIS — Z794 Long term (current) use of insulin: Secondary | ICD-10-CM | POA: Diagnosis not present

## 2018-07-15 DIAGNOSIS — I739 Peripheral vascular disease, unspecified: Secondary | ICD-10-CM

## 2018-07-15 DIAGNOSIS — I700 Atherosclerosis of aorta: Secondary | ICD-10-CM

## 2018-07-15 HISTORY — DX: Peripheral vascular disease, unspecified: I73.9

## 2018-07-15 HISTORY — DX: Type 2 diabetes mellitus without complications: E11.9

## 2018-07-15 NOTE — Telephone Encounter (Signed)
Patient scheduled for fu appointment.

## 2018-07-15 NOTE — Progress Notes (Signed)
Virtual Visit via Video Note    Evaluation Performed:  Follow-up visit  This visit type was conducted due to national recommendations for restrictions regarding the COVID-19 Pandemic (e.g. social distancing).  This format is felt to be most appropriate for this patient at this time.  All issues noted in this document were discussed and addressed.  No physical exam was performed (except for noted visual exam findings with Video Visits).  Please refer to the patient's chart (MyChart message for video visits and phone note for telephone visits) for the patient's consent to telehealth for [facility].  Date:  07/15/2018  ID: Molly [name], Molly [name] [birth year], MRN [id number]   Patient Location:  [place] [address]   Provider location:   [place] Office  PCP:  [name], MD  Cardiologist:  Jenne Campus, MD     Chief Complaint: I am doing well  History of Present Illness:    Molly [name] is a 68 y.o. female  who presents via audio/video conferencing for a telehealth visit today.  She does have history of hypertension diabetes peripheral vascular disease in form of carotid arterial disease.  Dyslipidemia we talked today about her problems overall she seems to be doing well and coping with the situation of coronavirus quite well.  She is trying to stay home her husband goes to do shopping about once a week.  Denies having any fever chills.  No cough no shortness of breath.  Her blood pressure appears to be still elevated today at 144 systolic she said when she check it at home usually she see numbers between 1 81-8 50 systolic.  I still think there is some room for improvement.  However I would like to see her Chem-7 before adjusting her medications.  Therefore she will contact her endocrinologist today that she need to do anyway and hopefully she will be able to go to the office and have Chem-7 done based on that we decide if we can improve her  medical management for her hypertension.  We may be forced to go to completely different class of medication to help with the blood pressure.  Clonidine come to mind.   The patient does not symptoms concerning for COVID-19 infection (fever, chills, cough, or new SHORTNESS OF BREATH).    Prior CV studies:   The following studies were reviewed today:  I reviewed carotic ultrasound.  It was done in summer of last year     Past Medical History:  Diagnosis Date  . Diabetes mellitus without complication ([place])   . Hypertension     Past Surgical History:  Procedure Laterality Date  . CATARACT EXTRACTION, BILATERAL    . CHOLECYSTECTOMY N/A 06/30/2015   Procedure: LAPAROSCOPIC CHOLECYSTECTOMY WITH INTRAOPERATIVE CHOLANGIOGRAM;  Surgeon: Donnie Mesa, MD;  Location: [place];  Service: General;  Laterality: N/A;     Current Meds  Medication Sig  . amLODipine (NORVASC) 10 MG tablet Take 1 tablet (10 mg total) by mouth daily.  Marland Kitchen aspirin EC 81 MG tablet Take 81 mg by mouth daily.  . cholecalciferol (VITAMIN D) 1000 units tablet Take 1,000 Units by mouth daily.  Marland Kitchen gabapentin (NEURONTIN) 100 MG capsule Take 100 mg by mouth 3 (three) times daily.  . hydrochlorothiazide (MICROZIDE) 12.5 MG capsule TAKE 1 CAPSULE(12.5 MG) BY MOUTH DAILY  . insulin NPH Human (HUMULIN N) 100 UNIT/ML injection Inject 10 Units into the skin 2 (two) times daily before a meal.  . metoprolol  succinate (TOPROL-XL) 50 MG 24 hr tablet Take 1 tablet by mouth daily.  . Pediatric Multivit-Minerals-C (FLINTSTONES COMPLETE PO) Take by mouth.  . rosuvastatin (CRESTOR) 10 MG tablet TAKE 1 TABLET(10 MG) BY MOUTH DAILY  . saccharomyces boulardii (FLORASTOR) 250 MG capsule Take 250 mg by mouth 2 (two) times daily.      Family History: The patient's family history includes Breast cancer in her mother; Hypertension in her father and mother.   ROS:   Please see the history of present illness.     All other systems reviewed and  are negative.   Labs/Other Tests and Data Reviewed:     Recent Labs: 04/12/2018: ALT 24 04/29/2018: BUN 36; Creatinine, Ser 1.21; Potassium 4.7; Sodium 139  Recent Lipid Panel    Component Value Date/Time   CHOL 152 04/12/2018 1153   TRIG 143 04/12/2018 1153   HDL 46 04/12/2018 1153   CHOLHDL 3.3 04/12/2018 1153   LDLCALC 77 04/12/2018 1153      Exam:    Vital Signs:  Wt 141 lb (64 kg)   BMI 24.20 kg/m    Well nourished, well developed female in no acute distress. Conference was done via video link.  She looks fine.  Not in any distress she was actually quite cheerful and happy during our conversation.  Denies having any swelling of lower extremities.  Diagnosis for this visit:   1. Essential hypertension   2. Abdominal aortic atherosclerosis with stenosis   3. Type 2 diabetes mellitus without complication, with long-term current use of insulin ([place])   4. Peripheral vascular disease ([place])      ASSESSMENT & PLAN:    1.  Essential hypertension: Still problematic.  Will get Chem-7 and make adjustment to her medications.  She may require a completely new class of medication. 2.  Abdominal aortic atherosclerosis.  Noted she is asymptomatic trying to walk outside some with no major difficulties. 3.  Type 2 diabetes.  Seeing endocrinologist on a regular basis today she need to call them with some instructions. 4.  Peripheral vascular disease doing well from that point review she will require cardiac ultrasounds.  COVID-19 Education: The signs and symptoms of COVID-19 were discussed with the patient and how to seek care for testing (follow up with PCP or arrange E-visit).  The importance of social distancing was discussed today.  Patient Risk:   After full review of this patients clinical status, I feel that they are at least moderate risk at this time.  Time:   Today, I have spent 21 minutes with the patient with telehealth technology discussing pt health issues.      Medication Adjustments/Labs and Tests Ordered: Current medicines are reviewed at length with the patient today.  Concerns regarding medicines are outlined above.  No orders of the defined types were placed in this encounter.  Medication changes: No orders of the defined types were placed in this encounter.    Disposition: Follow-up in 2 months, she will require cardiac ultrasounds acuity level is 3  Signed, Park Liter, MD, [facility] 07/15/2018 11:21 AM    [place]

## 2018-07-15 NOTE — Telephone Encounter (Signed)
Patient will need carotid US scheduled.

## 2018-07-15 NOTE — Patient Instructions (Signed)
Medication Instructions:  .isntcur  If you need a refill on your cardiac medications before your next appointment, please call your pharmacy.   Lab work: None.  If you have labs (blood work) drawn today and your tests are completely normal, you will receive your results only by: Marland Kitchen MyChart Message (if you have MyChart) OR . A paper copy in the mail If you have any lab test that is abnormal or we need to change your treatment, we will call you to review the results.  Testing/Procedures: Your physician has requested that you have a carotid duplex. This test is an ultrasound of the carotid arteries in your neck. It looks at blood flow through these arteries that supply the brain with blood. Allow one hour for this exam. There are no restrictions or special instructions.    Follow-Up: At [facility], you and your health needs are our priority.  As part of our continuing mission to provide you with exceptional heart care, we have created designated Provider Care Teams.  These Care Teams include your primary Cardiologist (physician) and Advanced Practice Providers (APPs -  Physician Assistants and Nurse Practitioners) who all work together to provide you with the care you need, when you need it. You will need a follow up appointment in 2 months.  Please call our office 2 months in advance to schedule this appointment.  You may see No primary care provider on file. or another member of our [employer] Provider Team in [place]: Shirlee More, MD . Jyl Heinz, MD  Any Other Special Instructions Will Be Listed Below (If Applicable).

## 2018-07-18 ENCOUNTER — Encounter (INDEPENDENT_AMBULATORY_CARE_PROVIDER_SITE_OTHER): Payer: Medicare Other | Admitting: Ophthalmology

## 2018-07-29 NOTE — Telephone Encounter (Signed)
Patient has been put in for a recall to be scheduled for her Carotid.

## 2018-08-01 ENCOUNTER — Encounter (INDEPENDENT_AMBULATORY_CARE_PROVIDER_SITE_OTHER): Payer: Medicare Other | Admitting: Ophthalmology

## 2018-08-01 ENCOUNTER — Other Ambulatory Visit: Payer: Self-pay

## 2018-08-01 DIAGNOSIS — E11319 Type 2 diabetes mellitus with unspecified diabetic retinopathy without macular edema: Secondary | ICD-10-CM | POA: Diagnosis not present

## 2018-08-01 DIAGNOSIS — E113591 Type 2 diabetes mellitus with proliferative diabetic retinopathy without macular edema, right eye: Secondary | ICD-10-CM

## 2018-08-21 ENCOUNTER — Other Ambulatory Visit: Payer: Self-pay

## 2018-08-21 ENCOUNTER — Ambulatory Visit: Payer: Medicare Other | Admitting: Podiatry

## 2018-08-21 ENCOUNTER — Encounter: Payer: Self-pay | Admitting: Podiatry

## 2018-08-21 VITALS — Temp 97.7°F

## 2018-08-21 DIAGNOSIS — B351 Tinea unguium: Secondary | ICD-10-CM | POA: Diagnosis not present

## 2018-08-21 DIAGNOSIS — M79674 Pain in right toe(s): Secondary | ICD-10-CM | POA: Diagnosis not present

## 2018-08-21 DIAGNOSIS — M79675 Pain in left toe(s): Secondary | ICD-10-CM | POA: Diagnosis not present

## 2018-08-21 DIAGNOSIS — E1142 Type 2 diabetes mellitus with diabetic polyneuropathy: Secondary | ICD-10-CM | POA: Diagnosis not present

## 2018-08-21 NOTE — Patient Instructions (Signed)
Diabetes Mellitus and Foot Care Foot care is an important part of your health, especially when you have diabetes. Diabetes may cause you to have problems because of poor blood flow (circulation) to your feet and legs, which can cause your skin to:  Become thinner and drier.  Break more easily.  Heal more slowly.  Peel and crack. You may also have nerve damage (neuropathy) in your legs and feet, causing decreased feeling in them. This means that you may not notice minor injuries to your feet that could lead to more serious problems. Noticing and addressing any potential problems early is the best way to prevent future foot problems. How to care for your feet Foot hygiene  Wash your feet daily with warm water and mild soap. Do not use hot water. Then, pat your feet and the areas between your toes until they are completely dry. Do not soak your feet as this can dry your skin.  Trim your toenails straight across. Do not dig under them or around the cuticle. File the edges of your nails with an emery board or nail file.  Apply a moisturizing lotion or petroleum jelly to the skin on your feet and to dry, brittle toenails. Use lotion that does not contain alcohol and is unscented. Do not apply lotion between your toes. Shoes and socks  Wear clean socks or stockings every day. Make sure they are not too tight. Do not wear knee-high stockings since they may decrease blood flow to your legs.  Wear shoes that fit properly and have enough cushioning. Always look in your shoes before you put them on to be sure there are no objects inside.  To break in new shoes, wear them for just a few hours a day. This prevents injuries on your feet. Wounds, scrapes, corns, and calluses  Check your feet daily for blisters, cuts, bruises, sores, and redness. If you cannot see the bottom of your feet, use a mirror or ask someone for help.  Do not cut corns or calluses or try to remove them with medicine.  If you  find a minor scrape, cut, or break in the skin on your feet, keep it and the skin around it clean and dry. You may clean these areas with mild soap and water. Do not clean the area with peroxide, alcohol, or iodine.  If you have a wound, scrape, corn, or callus on your foot, look at it several times a day to make sure it is healing and not infected. Check for: ? Redness, swelling, or pain. ? Fluid or blood. ? Warmth. ? Pus or a bad smell. General instructions  Do not cross your legs. This may decrease blood flow to your feet.  Do not use heating pads or hot water bottles on your feet. They may burn your skin. If you have lost feeling in your feet or legs, you may not know this is happening until it is too late.  Protect your feet from hot and cold by wearing shoes, such as at the beach or on hot pavement.  Schedule a complete foot exam at least once a year (annually) or more often if you have foot problems. If you have foot problems, report any cuts, sores, or bruises to your health care provider immediately. Contact a health care provider if:  You have a medical condition that increases your risk of infection and you have any cuts, sores, or bruises on your feet.  You have an injury that is not   healing.  You have redness on your legs or feet.  You feel burning or tingling in your legs or feet.  You have pain or cramps in your legs and feet.  Your legs or feet are numb.  Your feet always feel cold.  You have pain around a toenail. Get help right away if:  You have a wound, scrape, corn, or callus on your foot and: ? You have pain, swelling, or redness that gets worse. ? You have fluid or blood coming from the wound, scrape, corn, or callus. ? Your wound, scrape, corn, or callus feels warm to the touch. ? You have pus or a bad smell coming from the wound, scrape, corn, or callus. ? You have a fever. ? You have a red line going up your leg. Summary  Check your feet every day  for cuts, sores, red spots, swelling, and blisters.  Moisturize feet and legs daily.  Wear shoes that fit properly and have enough cushioning.  If you have foot problems, report any cuts, sores, or bruises to your health care provider immediately.  Schedule a complete foot exam at least once a year (annually) or more often if you have foot problems. This information is not intended to replace advice given to you by your health care provider. Make sure you discuss any questions you have with your health care provider. Document Released: 03/31/2000 Document Revised: 05/16/2017 Document Reviewed: 05/05/2016 Elsevier Interactive Patient Education  2019 [employer]  Onychomycosis/Fungal Toenails  WHAT IS IT? An infection that lies within the keratin of your nail plate that is caused by a fungus.  WHY ME? Fungal infections affect all ages, sexes, races, and creeds.  There may be many factors that predispose you to a fungal infection such as age, coexisting medical conditions such as diabetes, or an autoimmune disease; stress, medications, fatigue, genetics, etc.  Bottom line: fungus thrives in a warm, moist environment and your shoes offer such a location.  IS IT CONTAGIOUS? Theoretically, yes.  You do not want to share shoes, nail clippers or files with someone who has fungal toenails.  Walking around barefoot in the same room or sleeping in the same bed is unlikely to transfer the organism.  It is important to realize, however, that fungus can spread easily from one nail to the next on the same foot.  HOW DO WE TREAT THIS?  There are several ways to treat this condition.  Treatment may depend on many factors such as age, medications, pregnancy, liver and kidney conditions, etc.  It is best to ask your doctor which options are available to you.  1. No treatment.   Unlike many other medical concerns, you can live with this condition.  However for many people this can be a painful condition and  may lead to ingrown toenails or a bacterial infection.  It is recommended that you keep the nails cut short to help reduce the amount of fungal nail. 2. Topical treatment.  These range from herbal remedies to prescription strength nail lacquers.  About 40-50% effective, topicals require twice daily application for approximately 9 to 12 months or until an entirely new nail has grown out.  The most effective topicals are medical grade medications available through physicians offices. 3. Oral antifungal medications.  With an 80-90% cure rate, the most common oral medication requires 3 to 4 months of therapy and stays in your system for a year as the new nail grows out.  Oral antifungal medications do require   blood work to make sure it is a safe drug for you.  A liver function panel will be performed prior to starting the medication and after the first month of treatment.  It is important to have the blood work performed to avoid any harmful side effects.  In general, this medication safe but blood work is required. 4. Laser Therapy.  This treatment is performed by applying a specialized laser to the affected nail plate.  This therapy is noninvasive, fast, and non-painful.  It is not covered by insurance and is therefore, out of pocket.  The results have been very good with a 80-95% cure rate.  [facility] is the only practice in the area to offer this therapy. 5. Permanent Nail Avulsion.  Removing the entire nail so that a new nail will not grow back. 

## 2018-08-21 NOTE — Progress Notes (Signed)
Subjective: Molly [name] presents today for preventative diabetic foot care with longstanding mycotic toenails 1-5 b/l that she cannot cut and which interfere with daily activities.  Pain is aggravated when wearing enclosed shoe gear and relieved with periodic professional debridement.  She has neuropathy which is managed with gabapentin.  She relates her Achilles tendinitis has resolved with the heel lift given to her by Dr. Jacqualyn Posey on her last visit.  [name], MD    Current Outpatient Medications:  .  amLODipine (NORVASC) 10 MG tablet, Take 1 tablet (10 mg total) by mouth daily., Disp: 30 tablet, Rfl: 0 .  amLODipine (NORVASC) 5 MG tablet, TK 1 T PO QD, Disp: , Rfl:  .  aspirin EC 81 MG tablet, Take 81 mg by mouth daily., Disp: , Rfl:  .  cholecalciferol (VITAMIN D) 1000 units tablet, Take 1,000 Units by mouth daily., Disp: , Rfl: 2 .  gabapentin (NEURONTIN) 100 MG capsule, Take 100 mg by mouth 3 (three) times daily., Disp: , Rfl:  .  hydrochlorothiazide (MICROZIDE) 12.5 MG capsule, TAKE 1 CAPSULE(12.5 MG) BY MOUTH DAILY, Disp: 90 capsule, Rfl: 1 .  Insulin Isophane & Regular Human (NOVOLIN 70/30 FLEXPEN) (70-30) 100 UNIT/ML PEN, USE 10 UNITS BEFORE BREAKFAST AND 10 UNITS BEFORE EVENING MEAL TWICE A DAY (THIRTY MINUTES BEFORE MEALS), Disp: , Rfl:  .  insulin NPH Human (HUMULIN N) 100 UNIT/ML injection, Inject 10 Units into the skin 2 (two) times daily before a meal., Disp: , Rfl:  .  Insulin Pen Needle (FIFTY50 PEN NEEDLES) 32G X 4 MM MISC, USE AS DIRECTED TWICE DAILY, Disp: , Rfl:  .  metoprolol succinate (TOPROL-XL) 50 MG 24 hr tablet, Take 1 tablet by mouth daily., Disp: , Rfl: 0 .  Pediatric Multivit-Minerals-C (FLINTSTONES COMPLETE PO), Take by mouth., Disp: , Rfl:  .  Pediatric Multivitamins-Iron (FLINTSTONES COMPLETE) 18 MG CHEW, Chew by mouth., Disp: , Rfl:  .  rosuvastatin (CRESTOR) 10 MG tablet, TAKE 1 TABLET(10 MG) BY MOUTH DAILY, Disp: 90 tablet, Rfl: 1 .   saccharomyces boulardii (FLORASTOR) 250 MG capsule, Take 250 mg by mouth 2 (two) times daily., Disp: , Rfl:   Allergies  Allergen Reactions  . Mercury Nausea And Vomiting    Objective: Vitals:   08/21/18 1031  Temp: 97.7 F (36.5 C)   Vascular Examination: Capillary refill time immediate x 10 digits.  Dorsalis pedis faintly palpable b/l.  Posterior tibial pulses faintly palpable b/l.  Digital hair absent x 10 digits.  Skin temperature gradient WNL b/l.  Dermatological Examination: Skin with normal turgor, texture and tone b/l.  Toenails 1-5 b/l discolored, thick, dystrophic with subungual debris and pain with palpation to nailbeds due to thickness of nails. Old subungual hematoma left hallux with loose nailplate noted medial 17% of nail.  No underlying erythema, no edema, no drainage, no flocculence.  New nail under old nailplate.  Musculoskeletal: Muscle strength 5/5 to all LE muscle groups.  No gross bony deformities b/l.  No pain, crepitus or joint limitation noted with ROM.   Neurological: Sensation 4/5 left foot, 5/5 right foot with 10 gram monofilament.  Vibratory sensation intact b/l.  Last A1c: 10.9  Assessment: 1. 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 Resolved Achilles tendinitis left LE 3. NIDDM with neuropathy  Plan: 1. Toenails 1-5 b/l were debrided in length and girth without iatrogenic bleeding. 2. Patient to continue soft, supportive shoe gear daily. 3. Patient to report any pedal injuries to medical professional immediately. 4. Follow up  3 months.  5. Patient/POA to call should there be a concern in the interim.

## 2018-08-30 ENCOUNTER — Other Ambulatory Visit: Payer: Self-pay

## 2018-08-30 ENCOUNTER — Telehealth (INDEPENDENT_AMBULATORY_CARE_PROVIDER_SITE_OTHER): Payer: Medicare Other | Admitting: Cardiology

## 2018-08-30 ENCOUNTER — Encounter: Payer: Self-pay | Admitting: Cardiology

## 2018-08-30 VITALS — BP 152/76 | HR 56 | Temp 97.1°F | Wt 153.0 lb

## 2018-08-30 DIAGNOSIS — E119 Type 2 diabetes mellitus without complications: Secondary | ICD-10-CM

## 2018-08-30 DIAGNOSIS — E785 Hyperlipidemia, unspecified: Secondary | ICD-10-CM | POA: Insufficient documentation

## 2018-08-30 DIAGNOSIS — I739 Peripheral vascular disease, unspecified: Secondary | ICD-10-CM

## 2018-08-30 DIAGNOSIS — I10 Essential (primary) hypertension: Secondary | ICD-10-CM

## 2018-08-30 DIAGNOSIS — Z794 Long term (current) use of insulin: Secondary | ICD-10-CM

## 2018-08-30 HISTORY — DX: Hyperlipidemia, unspecified: E78.5

## 2018-08-30 NOTE — Patient Instructions (Signed)
Medication Instructions:  Your physician recommends that you continue on your current medications as directed. Please refer to the Current Medication list given to you today.  If you need a refill on your cardiac medications before your next appointment, please call your pharmacy.   Lab work: None.  If you have labs (blood work) drawn today and your tests are completely normal, you will receive your results only by: Marland Kitchen MyChart Message (if you have MyChart) OR . A paper copy in the mail If you have any lab test that is abnormal or we need to change your treatment, we will call you to review the results.  Testing/Procedures: None.   Follow-Up: At [facility], you and your health needs are our priority.  As part of our continuing mission to provide you with exceptional heart care, we have created designated Provider Care Teams.  These Care Teams include your primary Cardiologist (physician) and Advanced Practice Providers (APPs -  Physician Assistants and Nurse Practitioners) who all work together to provide you with the care you need, when you need it. You will need a follow up appointment in 4 months.  Please call our office 2 months in advance to schedule this appointment.  You may see No primary care provider on file. or another member of our [employer] Provider Team in [place]: Shirlee More, MD . Jyl Heinz, MD  Any Other Special Instructions Will Be Listed Below (If Applicable).

## 2018-08-30 NOTE — Progress Notes (Signed)
Virtual Visit via Video Note   This visit type was conducted due to national recommendations for restrictions regarding the COVID-19 Pandemic (e.g. social distancing) in an effort to limit this patient's exposure and mitigate transmission in our community.  Due to her co-morbid illnesses, this patient is at least at moderate risk for complications without adequate follow up.  This format is felt to be most appropriate for this patient at this time.  All issues noted in this document were discussed and addressed.  A limited physical exam was performed with this format.  Please refer to the patient's chart for her consent to telehealth for [facility].  Evaluation Performed:  Follow-up visit  This visit type was conducted due to national recommendations for restrictions regarding the COVID-19 Pandemic (e.g. social distancing).  This format is felt to be most appropriate for this patient at this time.  All issues noted in this document were discussed and addressed.  No physical exam was performed (except for noted visual exam findings with Video Visits).  Please refer to the patient's chart (MyChart message for video visits and phone note for telephone visits) for the patient's consent to telehealth for [facility].  Date:  08/30/2018  ID: Molly [name], DOB May 09, 1950, MRN [id number]   Patient Location: [place] [address]   Provider location:   [place] Office  PCP:  [name], MD  Cardiologist:  Jenne Campus, MD     Chief Complaint: Doing well  History of Present Illness:    Molly [name] is a 68 y.o. female  who presents via audio/video conferencing for a telehealth visit today.  With hypertension, diabetes, dyslipidemia she does have a video visit with me today to check on her condition.  Overall she is doing well denies have any chest pain tightness squeezing pressure burning chest no shortness of breath no palpitations no dizziness.   She actually does have her blood pressure fairly controlled on her sugars well controlled she is trying to walk outside some but still kind of scared because of coronavirus situation.   The patient does not have symptoms concerning for COVID-19 infection (fever, chills, cough, or new SHORTNESS OF BREATH).    Prior CV studies:   The following studies were reviewed today:  She is scheduled to have carotid ultrasounds which has been postponed because of coronavirus     Past Medical History:  Diagnosis Date  . Diabetes mellitus without complication ([place])   . Hypertension     Past Surgical History:  Procedure Laterality Date  . CATARACT EXTRACTION, BILATERAL    . CHOLECYSTECTOMY N/A 06/30/2015   Procedure: LAPAROSCOPIC CHOLECYSTECTOMY WITH INTRAOPERATIVE CHOLANGIOGRAM;  Surgeon: Donnie Mesa, MD;  Location: [place];  Service: General;  Laterality: N/A;     Current Meds  Medication Sig  . amLODipine (NORVASC) 10 MG tablet Take 1 tablet (10 mg total) by mouth daily.  Marland Kitchen aspirin EC 81 MG tablet Take 81 mg by mouth daily.  . cholecalciferol (VITAMIN D) 1000 units tablet Take 1,000 Units by mouth daily.  Marland Kitchen gabapentin (NEURONTIN) 100 MG capsule Take 100 mg by mouth 3 (three) times daily.  . hydrochlorothiazide (MICROZIDE) 12.5 MG capsule TAKE 1 CAPSULE(12.5 MG) BY MOUTH DAILY  . Insulin Isophane & Regular Human (NOVOLIN 70/30 FLEXPEN) (70-30) 100 UNIT/ML PEN USE 10 UNITS BEFORE BREAKFAST AND 10 UNITS BEFORE EVENING MEAL TWICE A DAY (THIRTY MINUTES BEFORE MEALS)  . insulin NPH Human (HUMULIN N) 100 UNIT/ML injection  Inject 10 Units into the skin 2 (two) times daily before a meal.  . Insulin Pen Needle (FIFTY50 PEN NEEDLES) 32G X 4 MM MISC USE AS DIRECTED TWICE DAILY  . metoprolol succinate (TOPROL-XL) 50 MG 24 hr tablet Take 1 tablet by mouth daily.  . Pediatric Multivit-Minerals-C (FLINTSTONES COMPLETE PO) Take by mouth.  . Pediatric Multivitamins-Iron (FLINTSTONES COMPLETE) 18 MG CHEW  Chew by mouth.  . rosuvastatin (CRESTOR) 10 MG tablet TAKE 1 TABLET(10 MG) BY MOUTH DAILY  . saccharomyces boulardii (FLORASTOR) 250 MG capsule Take 250 mg by mouth 2 (two) times daily.      Family History: The patient's family history includes Breast cancer in her mother; Hypertension in her father and mother.   ROS:   Please see the history of present illness.     All other systems reviewed and are negative.   Labs/Other Tests and Data Reviewed:     Recent Labs: 04/12/2018: ALT 24 04/29/2018: BUN 36; Creatinine, Ser 1.21; Potassium 4.7; Sodium 139  Recent Lipid Panel    Component Value Date/Time   CHOL 152 04/12/2018 1153   TRIG 143 04/12/2018 1153   HDL 46 04/12/2018 1153   CHOLHDL 3.3 04/12/2018 1153   LDLCALC 77 04/12/2018 1153      Exam:    Vital Signs:  BP (!) 152/76   Pulse (!) 56   Temp (!) 97.1 F (36.2 C)   Wt 153 lb (69.4 kg)   SpO2 98%   BMI 27.98 kg/m     Wt Readings from Last 3 Encounters:  08/30/18 153 lb (69.4 kg)  07/15/18 151 lb 3.2 oz (68.6 kg)  04/04/18 157 lb 12.8 oz (71.6 kg)     Well nourished, well developed in no acute distress. Alert awake oriented x3 happy to be able to talk to me over the video link.  Not in any distress.  Diagnosis for this visit:   1. Essential hypertension   2. Peripheral vascular disease ([place])   3. Type 2 diabetes mellitus without complication, with long-term current use of insulin ([place])   4. Dyslipidemia      ASSESSMENT & PLAN:    1.  Essential hypertension blood pressure appears to be much better controlled she reports blood pressure systolic between 097-353.  We will continue present management. 2.  Peripheral vascular disease time to schedule her to have carotid ultrasounds which we will do. 3.  Type 2 diabetes doing well from that point review with sugar in the 80s to 110. 4.  Dyslipidemia continue statin.  COVID-19 Education: The signs and symptoms of COVID-19 were discussed with the patient  and how to seek care for testing (follow up with PCP or arrange E-visit).  The importance of social distancing was discussed today.  Patient Risk:   After full review of this patients clinical status, I feel that they are at least moderate risk at this time.  Time:   Today, I have spent 18 minutes with the patient with telehealth technology discussing pt health issues.  I spent 5 minutes reviewing her chart before the visit.  Visit was finished at 10:16 AM.    Medication Adjustments/Labs and Tests Ordered: Current medicines are reviewed at length with the patient today.  Concerns regarding medicines are outlined above.  No orders of the defined types were placed in this encounter.  Medication changes: No orders of the defined types were placed in this encounter.    Disposition: Follow-up in 4 months  Signed, Park Liter,  MD, [facility] 08/30/2018 10:16 AM    [place]

## 2018-09-03 ENCOUNTER — Ambulatory Visit (HOSPITAL_BASED_OUTPATIENT_CLINIC_OR_DEPARTMENT_OTHER)
Admission: RE | Admit: 2018-09-03 | Discharge: 2018-09-03 | Disposition: A | Discharge status: Home / Self Care | Payer: Medicare Other | Source: Ambulatory Visit | Attending: Cardiology | Admitting: Cardiology

## 2018-09-03 ENCOUNTER — Other Ambulatory Visit: Payer: Self-pay

## 2018-09-03 DIAGNOSIS — I700 Atherosclerosis of aorta: Secondary | ICD-10-CM | POA: Insufficient documentation

## 2018-09-10 DIAGNOSIS — H02402 Unspecified ptosis of left eyelid: Secondary | ICD-10-CM

## 2018-09-10 DIAGNOSIS — R29898 Other symptoms and signs involving the musculoskeletal system: Secondary | ICD-10-CM | POA: Insufficient documentation

## 2018-09-10 DIAGNOSIS — R1319 Other dysphagia: Secondary | ICD-10-CM

## 2018-09-10 DIAGNOSIS — H4943 Progressive external ophthalmoplegia, bilateral: Secondary | ICD-10-CM | POA: Insufficient documentation

## 2018-09-10 HISTORY — DX: Unspecified ptosis of left eyelid: H02.402

## 2018-09-10 HISTORY — DX: Other dysphagia: R13.19

## 2018-09-10 HISTORY — DX: Other symptoms and signs involving the musculoskeletal system: R29.898

## 2018-09-10 HISTORY — DX: Progressive external ophthalmoplegia, bilateral: H49.43

## 2018-10-02 DIAGNOSIS — K580 Irritable bowel syndrome with diarrhea: Secondary | ICD-10-CM

## 2018-10-02 DIAGNOSIS — M8580 Other specified disorders of bone density and structure, unspecified site: Secondary | ICD-10-CM

## 2018-10-02 HISTORY — DX: Irritable bowel syndrome with diarrhea: K58.0

## 2018-10-02 HISTORY — DX: Other specified disorders of bone density and structure, unspecified site: M85.80

## 2018-10-30 ENCOUNTER — Other Ambulatory Visit: Payer: Self-pay

## 2018-10-30 ENCOUNTER — Ambulatory Visit: Payer: Medicare Other | Admitting: Podiatry

## 2018-10-30 ENCOUNTER — Encounter: Payer: Self-pay | Admitting: Podiatry

## 2018-10-30 VITALS — Temp 98.0°F

## 2018-10-30 DIAGNOSIS — M79674 Pain in right toe(s): Secondary | ICD-10-CM

## 2018-10-30 DIAGNOSIS — B351 Tinea unguium: Secondary | ICD-10-CM

## 2018-10-30 DIAGNOSIS — M79675 Pain in left toe(s): Secondary | ICD-10-CM | POA: Diagnosis not present

## 2018-10-30 NOTE — Patient Instructions (Signed)
Diabetes Mellitus and Foot Care Foot care is an important part of your health, especially when you have diabetes. Diabetes may cause you to have problems because of poor blood flow (circulation) to your feet and legs, which can cause your skin to:  Become thinner and drier.  Break more easily.  Heal more slowly.  Peel and crack. You may also have nerve damage (neuropathy) in your legs and feet, causing decreased feeling in them. This means that you may not notice minor injuries to your feet that could lead to more serious problems. Noticing and addressing any potential problems early is the best way to prevent future foot problems. How to care for your feet Foot hygiene  Wash your feet daily with warm water and mild soap. Do not use hot water. Then, pat your feet and the areas between your toes until they are completely dry. Do not soak your feet as this can dry your skin.  Trim your toenails straight across. Do not dig under them or around the cuticle. File the edges of your nails with an emery board or nail file.  Apply a moisturizing lotion or petroleum jelly to the skin on your feet and to dry, brittle toenails. Use lotion that does not contain alcohol and is unscented. Do not apply lotion between your toes. Shoes and socks  Wear clean socks or stockings every day. Make sure they are not too tight. Do not wear knee-high stockings since they may decrease blood flow to your legs.  Wear shoes that fit properly and have enough cushioning. Always look in your shoes before you put them on to be sure there are no objects inside.  To break in new shoes, wear them for just a few hours a day. This prevents injuries on your feet. Wounds, scrapes, corns, and calluses  Check your feet daily for blisters, cuts, bruises, sores, and redness. If you cannot see the bottom of your feet, use a mirror or ask someone for help.  Do not cut corns or calluses or try to remove them with medicine.  If you  find a minor scrape, cut, or break in the skin on your feet, keep it and the skin around it clean and dry. You may clean these areas with mild soap and water. Do not clean the area with peroxide, alcohol, or iodine.  If you have a wound, scrape, corn, or callus on your foot, look at it several times a day to make sure it is healing and not infected. Check for: ? Redness, swelling, or pain. ? Fluid or blood. ? Warmth. ? Pus or a bad smell. General instructions  Do not cross your legs. This may decrease blood flow to your feet.  Do not use heating pads or hot water bottles on your feet. They may burn your skin. If you have lost feeling in your feet or legs, you may not know this is happening until it is too late.  Protect your feet from hot and cold by wearing shoes, such as at the beach or on hot pavement.  Schedule a complete foot exam at least once a year (annually) or more often if you have foot problems. If you have foot problems, report any cuts, sores, or bruises to your health care provider immediately. Contact a health care provider if:  You have a medical condition that increases your risk of infection and you have any cuts, sores, or bruises on your feet.  You have an injury that is not   healing.  You have redness on your legs or feet.  You feel burning or tingling in your legs or feet.  You have pain or cramps in your legs and feet.  Your legs or feet are numb.  Your feet always feel cold.  You have pain around a toenail. Get help right away if:  You have a wound, scrape, corn, or callus on your foot and: ? You have pain, swelling, or redness that gets worse. ? You have fluid or blood coming from the wound, scrape, corn, or callus. ? Your wound, scrape, corn, or callus feels warm to the touch. ? You have pus or a bad smell coming from the wound, scrape, corn, or callus. ? You have a fever. ? You have a red line going up your leg. Summary  Check your feet every day  for cuts, sores, red spots, swelling, and blisters.  Moisturize feet and legs daily.  Wear shoes that fit properly and have enough cushioning.  If you have foot problems, report any cuts, sores, or bruises to your health care provider immediately.  Schedule a complete foot exam at least once a year (annually) or more often if you have foot problems. This information is not intended to replace advice given to you by your health care provider. Make sure you discuss any questions you have with your health care provider. Document Released: 03/31/2000 Document Revised: 05/16/2017 Document Reviewed: 05/05/2016 Elsevier Patient Education  2020 [employer]   Onychomycosis/Fungal Toenails  WHAT IS IT? An infection that lies within the keratin of your nail plate that is caused by a fungus.  WHY ME? Fungal infections affect all ages, sexes, races, and creeds.  There may be many factors that predispose you to a fungal infection such as age, coexisting medical conditions such as diabetes, or an autoimmune disease; stress, medications, fatigue, genetics, etc.  Bottom line: fungus thrives in a warm, moist environment and your shoes offer such a location.  IS IT CONTAGIOUS? Theoretically, yes.  You do not want to share shoes, nail clippers or files with someone who has fungal toenails.  Walking around barefoot in the same room or sleeping in the same bed is unlikely to transfer the organism.  It is important to realize, however, that fungus can spread easily from one nail to the next on the same foot.  HOW DO WE TREAT THIS?  There are several ways to treat this condition.  Treatment may depend on many factors such as age, medications, pregnancy, liver and kidney conditions, etc.  It is best to ask your doctor which options are available to you.  1. No treatment.   Unlike many other medical concerns, you can live with this condition.  However for many people this can be a painful condition and may lead to  ingrown toenails or a bacterial infection.  It is recommended that you keep the nails cut short to help reduce the amount of fungal nail. 2. Topical treatment.  These range from herbal remedies to prescription strength nail lacquers.  About 40-50% effective, topicals require twice daily application for approximately 9 to 12 months or until an entirely new nail has grown out.  The most effective topicals are medical grade medications available through physicians offices. 3. Oral antifungal medications.  With an 80-90% cure rate, the most common oral medication requires 3 to 4 months of therapy and stays in your system for a year as the new nail grows out.  Oral antifungal medications do require   blood work to make sure it is a safe drug for you.  A liver function panel will be performed prior to starting the medication and after the first month of treatment.  It is important to have the blood work performed to avoid any harmful side effects.  In general, this medication safe but blood work is required. 4. Laser Therapy.  This treatment is performed by applying a specialized laser to the affected nail plate.  This therapy is noninvasive, fast, and non-painful.  It is not covered by insurance and is therefore, out of pocket.  The results have been very good with a 80-95% cure rate.  [facility] is the only practice in the area to offer this therapy. 5. Permanent Nail Avulsion.  Removing the entire nail so that a new nail will not grow back. 

## 2018-11-03 NOTE — Progress Notes (Signed)
Subjective:  Molly [name] presents to clinic today with cc of  painful, thick, discolored, elongated toenails 1-5 b/l that become tender and cannot cut because of thickness. Pain is aggravated when wearing enclosed shoe gear.  [name], MD is her PCP.   Current Outpatient Medications:  .  amLODipine (NORVASC) 10 MG tablet, Take 1 tablet (10 mg total) by mouth daily., Disp: 30 tablet, Rfl: 0 .  amLODipine (NORVASC) 5 MG tablet, TK 1 T PO QD, Disp: , Rfl:  .  aspirin EC 81 MG tablet, Take 81 mg by mouth daily., Disp: , Rfl:  .  cholecalciferol (VITAMIN D) 1000 units tablet, Take 1,000 Units by mouth daily., Disp: , Rfl: 2 .  gabapentin (NEURONTIN) 100 MG capsule, Take 100 mg by mouth 3 (three) times daily., Disp: , Rfl:  .  hydrochlorothiazide (MICROZIDE) 12.5 MG capsule, TAKE 1 CAPSULE(12.5 MG) BY MOUTH DAILY, Disp: 90 capsule, Rfl: 1 .  Insulin Isophane & Regular Human (NOVOLIN 70/30 FLEXPEN) (70-30) 100 UNIT/ML PEN, USE 10 UNITS BEFORE BREAKFAST AND 10 UNITS BEFORE EVENING MEAL TWICE A DAY (THIRTY MINUTES BEFORE MEALS), Disp: , Rfl:  .  insulin NPH Human (HUMULIN N) 100 UNIT/ML injection, Inject 10 Units into the skin 2 (two) times daily before a meal., Disp: , Rfl:  .  Insulin Pen Needle (FIFTY50 PEN NEEDLES) 32G X 4 MM MISC, USE AS DIRECTED TWICE DAILY, Disp: , Rfl:  .  metoprolol succinate (TOPROL-XL) 50 MG 24 hr tablet, Take 1 tablet by mouth daily., Disp: , Rfl: 0 .  omeprazole (PRILOSEC) 40 MG capsule, omeprazole 40 mg capsule,delayed release  TK 1 C PO QD B BRE, Disp: , Rfl:  .  Pediatric Multivit-Minerals-C (FLINTSTONES COMPLETE PO), Take by mouth., Disp: , Rfl:  .  Pediatric Multivitamins-Iron (FLINTSTONES COMPLETE) 18 MG CHEW, Chew by mouth., Disp: , Rfl:  .  rosuvastatin (CRESTOR) 10 MG tablet, TAKE 1 TABLET(10 MG) BY MOUTH DAILY, Disp: 90 tablet, Rfl: 1 .  saccharomyces boulardii (FLORASTOR) 250 MG capsule, Take 250 mg by mouth 2 (two) times daily., Disp: , Rfl:     Allergies  Allergen Reactions  . Mercury Nausea And Vomiting     Objective: Vitals:   10/30/18 1121  Temp: 98 F (36.7 C)    Physical Examination:  Vascular Examination: Capillary refill time immediate x 10 digits.  Faintly palpable DP pulses b/l.  PT pulses faintly palpable b/l.  Digital hair absent b/l.  No edema noted b/l.  Skin temperature gradient WNL b/l.  Dermatological Examination: Skin with normal turgor, texture and tone b/l.  No open wounds b/l.  No interdigital macerations noted b/l.  Elongated, thick, discolored brittle toenails with subungual debris and pain on dorsal palpation of nailbeds 1-5 b/l.  Subungual hematoma left 2nd digit. No erythema, no edema, no drainage, no flocculence.  Musculoskeletal Examination: Muscle strength 5/5 b/l.  No pain, crepitus or joint discomfort with active/passive ROM.  Neurological Examination: Sensation intact 5/5 right foot, 4/5 left foot with 10 gram monofilament.  Assessment: Mycotic nail infection with pain 1-5 b/l  Plan: 1. Toenails 1-5 b/l were debrided in length and girth without iatrogenic laceration. 2.  Continue soft, supportive shoe gear daily. 3.  Report any pedal injuries to medical professional. 4.  Follow up 9 weeks. 5.  Patient/POA to call should there be a question/concern in there interim.

## 2018-12-02 ENCOUNTER — Encounter (INDEPENDENT_AMBULATORY_CARE_PROVIDER_SITE_OTHER): Payer: Medicare Other | Admitting: Ophthalmology

## 2018-12-02 ENCOUNTER — Other Ambulatory Visit: Payer: Self-pay

## 2018-12-02 DIAGNOSIS — H35033 Hypertensive retinopathy, bilateral: Secondary | ICD-10-CM

## 2018-12-02 DIAGNOSIS — E11311 Type 2 diabetes mellitus with unspecified diabetic retinopathy with macular edema: Secondary | ICD-10-CM

## 2018-12-02 DIAGNOSIS — I10 Essential (primary) hypertension: Secondary | ICD-10-CM | POA: Diagnosis not present

## 2018-12-02 DIAGNOSIS — E113411 Type 2 diabetes mellitus with severe nonproliferative diabetic retinopathy with macular edema, right eye: Secondary | ICD-10-CM | POA: Diagnosis not present

## 2018-12-02 DIAGNOSIS — E113592 Type 2 diabetes mellitus with proliferative diabetic retinopathy without macular edema, left eye: Secondary | ICD-10-CM | POA: Diagnosis not present

## 2018-12-02 DIAGNOSIS — H43813 Vitreous degeneration, bilateral: Secondary | ICD-10-CM

## 2018-12-10 ENCOUNTER — Other Ambulatory Visit: Payer: Self-pay

## 2018-12-10 ENCOUNTER — Encounter (INDEPENDENT_AMBULATORY_CARE_PROVIDER_SITE_OTHER): Payer: Medicare Other | Admitting: Ophthalmology

## 2018-12-10 DIAGNOSIS — E113511 Type 2 diabetes mellitus with proliferative diabetic retinopathy with macular edema, right eye: Secondary | ICD-10-CM | POA: Diagnosis not present

## 2019-01-02 ENCOUNTER — Telehealth (INDEPENDENT_AMBULATORY_CARE_PROVIDER_SITE_OTHER): Payer: Medicare Other | Admitting: Cardiology

## 2019-01-02 ENCOUNTER — Encounter: Payer: Self-pay | Admitting: Cardiology

## 2019-01-02 ENCOUNTER — Telehealth: Payer: Medicare Other | Admitting: Cardiology

## 2019-01-02 ENCOUNTER — Other Ambulatory Visit: Payer: Self-pay

## 2019-01-02 VITALS — BP 152/77 | HR 52 | Wt 157.0 lb

## 2019-01-02 DIAGNOSIS — I10 Essential (primary) hypertension: Secondary | ICD-10-CM

## 2019-01-02 DIAGNOSIS — I739 Peripheral vascular disease, unspecified: Secondary | ICD-10-CM

## 2019-01-02 DIAGNOSIS — E119 Type 2 diabetes mellitus without complications: Secondary | ICD-10-CM

## 2019-01-02 DIAGNOSIS — Z794 Long term (current) use of insulin: Secondary | ICD-10-CM

## 2019-01-02 DIAGNOSIS — I700 Atherosclerosis of aorta: Secondary | ICD-10-CM

## 2019-01-02 DIAGNOSIS — E785 Hyperlipidemia, unspecified: Secondary | ICD-10-CM

## 2019-01-02 NOTE — Progress Notes (Signed)
Virtual Visit via Telephone Note   This visit type was conducted due to national recommendations for restrictions regarding the COVID-19 Pandemic (e.g. social distancing) in an effort to limit this patient's exposure and mitigate transmission in our community.  Due to her co-morbid illnesses, this patient is at least at moderate risk for complications without adequate follow up.  This format is felt to be most appropriate for this patient at this time.  The patient did not have access to video technology/had technical difficulties with video requiring transitioning to audio format only (telephone).  All issues noted in this document were discussed and addressed.  No physical exam could be performed with this format.  Please refer to the patient's chart for her  consent to telehealth for [facility].  Evaluation Performed:  Follow-up visit  This visit type was conducted due to national recommendations for restrictions regarding the COVID-19 Pandemic (e.g. social distancing).  This format is felt to be most appropriate for this patient at this time.  All issues noted in this document were discussed and addressed.  No physical exam was performed (except for noted visual exam findings with Video Visits).  Please refer to the patient's chart (MyChart message for video visits and phone note for telephone visits) for the patient's consent to telehealth for [facility].  Date:  01/02/2019  ID: [name], DOB 26-Jan-1951, MRN [id number]   Patient Location: [place] [id number]   Provider location:   [place] Office  PCP:  [name], MD  Cardiologist:  Jenne Campus, MD     Chief Complaint: Doing well  History of Present Illness:    Molly [name] is a 68 y.o. female  who presents via audio/video conferencing for a telehealth visit today.  With essential hypertension, dyslipidemia, diabetes as well as peripheral vascular disease   The patient  does not have symptoms concerning for COVID-19 infection (fever, chills, cough, or new SHORTNESS OF BREATH).    Prior CV studies:   The following studies were reviewed today:       Past Medical History:  Diagnosis Date  . Diabetes mellitus without complication ([place])   . Hypertension     Past Surgical History:  Procedure Laterality Date  . CATARACT EXTRACTION, BILATERAL    . CHOLECYSTECTOMY N/A 06/30/2015   Procedure: LAPAROSCOPIC CHOLECYSTECTOMY WITH INTRAOPERATIVE CHOLANGIOGRAM;  Surgeon: Donnie Mesa, MD;  Location: [place];  Service: General;  Laterality: N/A;     Current Meds  Medication Sig  . amLODipine (NORVASC) 5 MG tablet TK 1 T PO QD  . aspirin EC 81 MG tablet Take 81 mg by mouth daily.  . cholecalciferol (VITAMIN D) 1000 units tablet Take 1,000 Units by mouth daily.  Marland Kitchen gabapentin (NEURONTIN) 100 MG capsule Take 100 mg by mouth 3 (three) times daily.  . hydrochlorothiazide (MICROZIDE) 12.5 MG capsule TAKE 1 CAPSULE(12.5 MG) BY MOUTH DAILY  . Insulin Isophane & Regular Human (NOVOLIN 70/30 FLEXPEN) (70-30) 100 UNIT/ML PEN USE 10 UNITS BEFORE BREAKFAST AND 10 UNITS BEFORE EVENING MEAL TWICE A DAY (THIRTY MINUTES BEFORE MEALS)  . insulin NPH Human (HUMULIN N) 100 UNIT/ML injection Inject 10 Units into the skin 2 (two) times daily before a meal.  . Insulin Pen Needle (FIFTY50 PEN NEEDLES) 32G X 4 MM MISC USE AS DIRECTED TWICE DAILY  . losartan (COZAAR) 25 MG tablet Take 1 tablet by mouth daily.  . metoprolol succinate (TOPROL-XL) 50 MG 24 hr tablet Take 1 tablet  by mouth daily.  . Pediatric Multivitamins-Iron (FLINTSTONES COMPLETE) 18 MG CHEW Chew by mouth.  . rosuvastatin (CRESTOR) 10 MG tablet TAKE 1 TABLET(10 MG) BY MOUTH DAILY  . saccharomyces boulardii (FLORASTOR) 250 MG capsule Take 250 mg by mouth 2 (two) times daily.      Family History: The patient's family history includes Breast cancer in her mother; Hypertension in her father and mother.   ROS:   Please  see the history of present illness.     All other systems reviewed and are negative.   Labs/Other Tests and Data Reviewed:     Recent Labs: 04/12/2018: ALT 24 04/29/2018: BUN 36; Creatinine, Ser 1.21; Potassium 4.7; Sodium 139  Recent Lipid Panel    Component Value Date/Time   CHOL 152 04/12/2018 1153   TRIG 143 04/12/2018 1153   HDL 46 04/12/2018 1153   CHOLHDL 3.3 04/12/2018 1153   LDLCALC 77 04/12/2018 1153      Exam:    Vital Signs:  BP (!) 152/77   Pulse (!) 52   Wt 157 lb (71.2 kg)   BMI 28.72 kg/m     Wt Readings from Last 3 Encounters:  01/02/19 157 lb (71.2 kg)  08/30/18 153 lb (69.4 kg)  07/15/18 151 lb 3.2 oz (68.6 kg)     Well nourished, well developed in no acute distress. Alert oriented x3 not in distress sitting at her home and I am in our office in [facility].  Overall she is doing well not in any distress at the time of my interview.  Diagnosis for this visit:   1. Essential hypertension   2. Abdominal aortic atherosclerosis with stenosis   3. Peripheral vascular disease ([place])   4. Dyslipidemia   5. Type 2 diabetes mellitus without complication, with long-term current use of insulin ([place])      ASSESSMENT & PLAN:    1.  Essential hypertension blood pressure well controlled continue present management. Abdominal aortic atherosclerosis with stenosis.  Asymptomatic doing well try to walk on a regular basis 3.  Peripheral vascular disease recent carotic ultrasound done no critical stenosis in carotid arteries. 4.  Dyslipidemia continue present management 5.  Type 2 diabetes hemoglobin A1c 6.9.  She is watching her diet try to exercise on a regular basis.  Recently got a new dog that she walks on a regular basis.  COVID-19 Education: The signs and symptoms of COVID-19 were discussed with the patient and how to seek care for testing (follow up with PCP or arrange E-visit).  The importance of social distancing was discussed today.  Patient Risk:    After full review of this patients clinical status, I feel that they are at least moderate risk at this time.  Time:   Today, I have spent 15 minutes with the patient with telehealth technology discussing pt health issues.  I spent 5 minutes reviewing her chart before the visit.  Visit was finished at 11:58 AM.    Medication Adjustments/Labs and Tests Ordered: Current medicines are reviewed at length with the patient today.  Concerns regarding medicines are outlined above.  No orders of the defined types were placed in this encounter.  Medication changes: No orders of the defined types were placed in this encounter.    Disposition: Follow-up 5 months  Signed, Park Liter, MD, [facility] 01/02/2019 12:05 PM    [place]

## 2019-01-02 NOTE — Patient Instructions (Signed)
Medication Instructions:  Your physician recommends that you continue on your current medications as directed. Please refer to the Current Medication list given to you today.  If you need a refill on your cardiac medications before your next appointment, please call your pharmacy.   Lab work: None. If you have labs (blood work) drawn today and your tests are completely normal, you will receive your results only by: . MyChart Message (if you have MyChart) OR . A paper copy in the mail If you have any lab test that is abnormal or we need to change your treatment, we will call you to review the results.  Testing/Procedures: None   Follow-Up: At [facility], you and your health needs are our priority.  As part of our continuing mission to provide you with exceptional heart care, we have created designated Provider Care Teams.  These Care Teams include your primary Cardiologist (physician) and Advanced Practice Providers (APPs -  Physician Assistants and Nurse Practitioners) who all work together to provide you with the care you need, when you need it. You will need a follow up appointment in 5 months.  Please call our office 2 months in advance to schedule this appointment.  You may see No primary care provider on file. or another member of our [facility] Provider Team in [place]: Brian Munley, MD . Rajan Revankar, MD  Any Other Special Instructions Will Be Listed Below (If Applicable).    

## 2019-01-06 ENCOUNTER — Other Ambulatory Visit: Payer: Self-pay | Admitting: Cardiology

## 2019-01-29 ENCOUNTER — Ambulatory Visit: Payer: Medicare Other | Admitting: Podiatry

## 2019-02-03 ENCOUNTER — Encounter: Payer: Self-pay | Admitting: Podiatry

## 2019-02-03 ENCOUNTER — Other Ambulatory Visit: Payer: Self-pay

## 2019-02-03 ENCOUNTER — Ambulatory Visit: Payer: Medicare Other | Admitting: Podiatry

## 2019-02-03 DIAGNOSIS — M79675 Pain in left toe(s): Secondary | ICD-10-CM | POA: Diagnosis not present

## 2019-02-03 DIAGNOSIS — B351 Tinea unguium: Secondary | ICD-10-CM | POA: Diagnosis not present

## 2019-02-03 DIAGNOSIS — M79674 Pain in right toe(s): Secondary | ICD-10-CM | POA: Diagnosis not present

## 2019-02-03 NOTE — Progress Notes (Signed)
Subjective: Molly [name] is seen today for follow up painful, elongated, thickened toenails 1-5 b/l feet that she cannot cut. Pain interferes with daily activities. Aggravating factor includes wearing enclosed shoe gear and relieved with periodic debridement.  Current Outpatient Medications on File Prior to Visit  Medication Sig  . amLODipine (NORVASC) 5 MG tablet TK 1 T PO QD  . aspirin EC 81 MG tablet Take 81 mg by mouth daily.  . cholecalciferol (VITAMIN D) 1000 units tablet Take 1,000 Units by mouth daily.  Marland Kitchen gabapentin (NEURONTIN) 100 MG capsule Take 100 mg by mouth 3 (three) times daily.  . hydrochlorothiazide (MICROZIDE) 12.5 MG capsule TAKE 1 CAPSULE(12.5 MG) BY MOUTH DAILY  . Insulin Isophane & Regular Human (NOVOLIN 70/30 FLEXPEN) (70-30) 100 UNIT/ML PEN USE 10 UNITS BEFORE BREAKFAST AND 10 UNITS BEFORE EVENING MEAL TWICE A DAY (THIRTY MINUTES BEFORE MEALS)  . insulin NPH Human (HUMULIN N) 100 UNIT/ML injection Inject 10 Units into the skin 2 (two) times daily before a meal.  . Insulin Pen Needle (FIFTY50 PEN NEEDLES) 32G X 4 MM MISC USE AS DIRECTED TWICE DAILY  . losartan (COZAAR) 25 MG tablet Take 1 tablet by mouth daily.  . metoprolol succinate (TOPROL-XL) 50 MG 24 hr tablet Take 1 tablet by mouth daily.  . Pediatric Multivitamins-Iron (FLINTSTONES COMPLETE) 18 MG CHEW Chew by mouth.  . rosuvastatin (CRESTOR) 10 MG tablet TAKE 1 TABLET(10 MG) BY MOUTH DAILY  . saccharomyces boulardii (FLORASTOR) 250 MG capsule Take 250 mg by mouth 2 (two) times daily.  Marland Kitchen triamcinolone cream (KENALOG) 0.1 % APP EXT AA BID FOR 7 DAYS   No current facility-administered medications on file prior to visit.      Allergies  Allergen Reactions  . Mercury Nausea And Vomiting     Objective:  Vascular Examination: Capillary refill time immediate x 10 digits.  Dorsalis pedis pulses faintly palpable b/l.  Posterior tibial pulses faintly palpable b/l.  Digital hair absent b/l.  Skin temperature  gradient WNL b/l.   Dermatological Examination: Skin with normal turgor, texture and tone b/l.  No open wounds b/l.  Toenails 1-5 b/l discolored, thick, dystrophic with subungual debris and pain with palpation to nailbeds due to thickness of nails.  Musculoskeletal: Muscle strength 5/5 to all LE muscle groups b/l.  No gross bony deformities b/l.  No pain, crepitus or joint limitation noted with ROM.   Neurological Examination: Protective sensation intact with 10 gram monofilament bilaterally.  Epicritic sensation present bilaterally.  Vibratory sensation intact bilaterally.   Assessment: Painful onychomycosis toenails 1-5 b/l   Plan: 1. Toenails 1-5 b/l were debrided in length and girth without iatrogenic bleeding. 2. Patient to continue soft, supportive shoe gear 3. Patient to report any pedal injuries to medical professional immediately. 4. Follow up 3 months.  5. Patient/POA to call should there be a concern in the interim.

## 2019-02-03 NOTE — Patient Instructions (Signed)
Diabetes Mellitus and Foot Care Foot care is an important part of your health, especially when you have diabetes. Diabetes may cause you to have problems because of poor blood flow (circulation) to your feet and legs, which can cause your skin to:  Become thinner and drier.  Break more easily.  Heal more slowly.  Peel and crack. You may also have nerve damage (neuropathy) in your legs and feet, causing decreased feeling in them. This means that you may not notice minor injuries to your feet that could lead to more serious problems. Noticing and addressing any potential problems early is the best way to prevent future foot problems. How to care for your feet Foot hygiene  Wash your feet daily with warm water and mild soap. Do not use hot water. Then, pat your feet and the areas between your toes until they are completely dry. Do not soak your feet as this can dry your skin.  Trim your toenails straight across. Do not dig under them or around the cuticle. File the edges of your nails with an emery board or nail file.  Apply a moisturizing lotion or petroleum jelly to the skin on your feet and to dry, brittle toenails. Use lotion that does not contain alcohol and is unscented. Do not apply lotion between your toes. Shoes and socks  Wear clean socks or stockings every day. Make sure they are not too tight. Do not wear knee-high stockings since they may decrease blood flow to your legs.  Wear shoes that fit properly and have enough cushioning. Always look in your shoes before you put them on to be sure there are no objects inside.  To break in new shoes, wear them for just a few hours a day. This prevents injuries on your feet. Wounds, scrapes, corns, and calluses  Check your feet daily for blisters, cuts, bruises, sores, and redness. If you cannot see the bottom of your feet, use a mirror or ask someone for help.  Do not cut corns or calluses or try to remove them with medicine.  If you  find a minor scrape, cut, or break in the skin on your feet, keep it and the skin around it clean and dry. You may clean these areas with mild soap and water. Do not clean the area with peroxide, alcohol, or iodine.  If you have a wound, scrape, corn, or callus on your foot, look at it several times a day to make sure it is healing and not infected. Check for: ? Redness, swelling, or pain. ? Fluid or blood. ? Warmth. ? Pus or a bad smell. General instructions  Do not cross your legs. This may decrease blood flow to your feet.  Do not use heating pads or hot water bottles on your feet. They may burn your skin. If you have lost feeling in your feet or legs, you may not know this is happening until it is too late.  Protect your feet from hot and cold by wearing shoes, such as at the beach or on hot pavement.  Schedule a complete foot exam at least once a year (annually) or more often if you have foot problems. If you have foot problems, report any cuts, sores, or bruises to your health care provider immediately. Contact a health care provider if:  You have a medical condition that increases your risk of infection and you have any cuts, sores, or bruises on your feet.  You have an injury that is not   healing.  You have redness on your legs or feet.  You feel burning or tingling in your legs or feet.  You have pain or cramps in your legs and feet.  Your legs or feet are numb.  Your feet always feel cold.  You have pain around a toenail. Get help right away if:  You have a wound, scrape, corn, or callus on your foot and: ? You have pain, swelling, or redness that gets worse. ? You have fluid or blood coming from the wound, scrape, corn, or callus. ? Your wound, scrape, corn, or callus feels warm to the touch. ? You have pus or a bad smell coming from the wound, scrape, corn, or callus. ? You have a fever. ? You have a red line going up your leg. Summary  Check your feet every day  for cuts, sores, red spots, swelling, and blisters.  Moisturize feet and legs daily.  Wear shoes that fit properly and have enough cushioning.  If you have foot problems, report any cuts, sores, or bruises to your health care provider immediately.  Schedule a complete foot exam at least once a year (annually) or more often if you have foot problems. This information is not intended to replace advice given to you by your health care provider. Make sure you discuss any questions you have with your health care provider. Document Released: 03/31/2000 Document Revised: 05/16/2017 Document Reviewed: 05/05/2016 Elsevier Patient Education  2020 Elsevier Inc.  

## 2019-04-21 ENCOUNTER — Other Ambulatory Visit: Payer: Self-pay

## 2019-04-21 ENCOUNTER — Encounter (INDEPENDENT_AMBULATORY_CARE_PROVIDER_SITE_OTHER): Payer: Medicare Other | Admitting: Ophthalmology

## 2019-04-21 DIAGNOSIS — I10 Essential (primary) hypertension: Secondary | ICD-10-CM | POA: Diagnosis not present

## 2019-04-21 DIAGNOSIS — H35033 Hypertensive retinopathy, bilateral: Secondary | ICD-10-CM | POA: Diagnosis not present

## 2019-04-21 DIAGNOSIS — H43813 Vitreous degeneration, bilateral: Secondary | ICD-10-CM

## 2019-04-21 DIAGNOSIS — E11319 Type 2 diabetes mellitus with unspecified diabetic retinopathy without macular edema: Secondary | ICD-10-CM

## 2019-04-21 DIAGNOSIS — E113593 Type 2 diabetes mellitus with proliferative diabetic retinopathy without macular edema, bilateral: Secondary | ICD-10-CM | POA: Diagnosis not present

## 2019-05-06 ENCOUNTER — Ambulatory Visit: Payer: Medicare Other | Admitting: Podiatry

## 2019-05-06 ENCOUNTER — Encounter: Payer: Self-pay | Admitting: Podiatry

## 2019-05-06 ENCOUNTER — Other Ambulatory Visit: Payer: Self-pay

## 2019-05-06 DIAGNOSIS — M79674 Pain in right toe(s): Secondary | ICD-10-CM

## 2019-05-06 DIAGNOSIS — M79675 Pain in left toe(s): Secondary | ICD-10-CM

## 2019-05-06 DIAGNOSIS — B351 Tinea unguium: Secondary | ICD-10-CM | POA: Diagnosis not present

## 2019-05-06 NOTE — Progress Notes (Signed)
Subjective: Molly [name] presents today for preventative diabetic foot care. Patient is seen for follow up of painful, mycotic toenails which interfere with comfortable ambulation when wearing enclosed shoe gear. Pain is relieved with periodic professional debridement.  She takes gabapentin for neuropathy.  She voices no new pedal problems on today's visit.  Medications reviewed in chart.  Allergies  Allergen Reactions  . Mercury Nausea And Vomiting    Objective: There were no vitals filed for this visit.  Vascular Examination: Capillary refill time immediate x 10 digits.  Dorsalis pedis and posterior tibial pulses are faintly palpable b/l.  Digital hair absent b/l.   Skin temperature gradient WNL b/l.   Dermatological Examination: Skin with normal turgor, texture and tone b/l.  Toenails 1-5 b/l discolored, thick, dystrophic with subungual debris and pain with palpation to nailbeds due to thickness of nails.  Musculoskeletal: Muscle strength 5/5 b/l to all LE muscle groups.  Gross bony deformities:  None.  No pain, crepitus or joint limitation with passive/active ROM b/l.  Neurological Examination: Protective sensation intact 5/5 b/l with 10 gram monofilament.  Vibratory sensation intact bilaterally.   Assessment: 1. Painful onychomycosis toenails 1-5 b/l 2. NIDDM  Plan: 1. Continue diabetic foot care principles. Literature dispensed on today. 2. Toenails 1-5 b/l were debrided in length and girth without iatrogenic bleeding. 3. Patient to continue soft, supportive shoe gear. 4. Patient to report any pedal injuries to medical professional. 5. Follow up 3 months.  6. Patient/POA to call should there be a concern in the interim.

## 2019-05-06 NOTE — Patient Instructions (Signed)
Please schedule appointment with your Dermatologist for evaluation of left 2nd digit subungual mole and have report sent to me.

## 2019-06-08 ENCOUNTER — Ambulatory Visit: Payer: Medicare Other | Attending: Internal Medicine

## 2019-06-08 DIAGNOSIS — Z23 Encounter for immunization: Secondary | ICD-10-CM | POA: Insufficient documentation

## 2019-06-08 NOTE — Progress Notes (Signed)
   Covid-19 Vaccination Clinic  Name:  Molly [name]    MRN: [id number] DOB: 05-08-1950  06/08/2019  [name] was observed post Covid-19 immunization for 15 minutes without incidence. She was provided with Vaccine Information Sheet and instruction to access the V-Safe system.   [name] was instructed to call 911 with any severe reactions post vaccine: Marland Kitchen Difficulty breathing  . Swelling of your face and throat  . A fast heartbeat  . A bad rash all over your body  . Dizziness and weakness    Immunizations Administered    Name Date Dose VIS Date Route   Pfizer COVID-19 Vaccine 06/08/2019 12:23 PM 0.3 mL 03/28/2019 Intramuscular   Manufacturer: [place]   Lot: [id number]   [place]: [id number]

## 2019-07-02 ENCOUNTER — Ambulatory Visit: Payer: Medicare Other | Attending: Internal Medicine

## 2019-07-02 DIAGNOSIS — Z23 Encounter for immunization: Secondary | ICD-10-CM

## 2019-07-02 NOTE — Progress Notes (Signed)
   Covid-19 Vaccination Clinic  Name:  Molly [name]    MRN: [id number] DOB: 1951/01/27  07/02/2019  Molly [name] was observed post Covid-19 immunization for 15 minutes without incident. She was provided with Vaccine Information Sheet and instruction to access the V-Safe system.   Molly [name] was instructed to call 911 with any severe reactions post vaccine: Marland Kitchen Difficulty breathing  . Swelling of face and throat  . A fast heartbeat  . A bad rash all over body  . Dizziness and weakness   Immunizations Administered    Name Date Dose VIS Date Route   Pfizer COVID-19 Vaccine 07/02/2019  9:24 AM 0.3 mL 03/28/2019 Intramuscular   Manufacturer: [place]   Lot: [id number]   [place]: [address]-7

## 2019-07-09 ENCOUNTER — Other Ambulatory Visit: Payer: Self-pay

## 2019-07-10 ENCOUNTER — Encounter: Payer: Self-pay | Admitting: Cardiology

## 2019-07-10 ENCOUNTER — Telehealth (INDEPENDENT_AMBULATORY_CARE_PROVIDER_SITE_OTHER): Payer: Medicare Other | Admitting: Cardiology

## 2019-07-10 ENCOUNTER — Telehealth: Payer: Self-pay | Admitting: Emergency Medicine

## 2019-07-10 VITALS — Ht 62.0 in | Wt 156.0 lb

## 2019-07-10 DIAGNOSIS — I739 Peripheral vascular disease, unspecified: Secondary | ICD-10-CM

## 2019-07-10 DIAGNOSIS — E559 Vitamin D deficiency, unspecified: Secondary | ICD-10-CM

## 2019-07-10 DIAGNOSIS — I10 Essential (primary) hypertension: Secondary | ICD-10-CM

## 2019-07-10 DIAGNOSIS — E119 Type 2 diabetes mellitus without complications: Secondary | ICD-10-CM

## 2019-07-10 DIAGNOSIS — E785 Hyperlipidemia, unspecified: Secondary | ICD-10-CM

## 2019-07-10 NOTE — Patient Instructions (Signed)
Medication Instructions:  Your physician recommends that you continue on your current medications as directed. Please refer to the Current Medication list given to you today.  *If you need a refill on your cardiac medications before your next appointment, please call your pharmacy*   Lab Work: Your physician recommends that you return for lab work within 1 week: cbc, tsh  If you have labs (blood work) drawn today and your tests are completely normal, you will receive your results only by: Marland Kitchen MyChart Message (if you have MyChart) OR . A paper copy in the mail If you have any lab test that is abnormal or we need to change your treatment, we will call you to review the results.   Testing/Procedures:  Your physician has requested that you have a lower extremity arterial exercise duplex. During this test, exercise and ultrasound are used to evaluate arterial blood flow in the legs. Allow one hour for this exam. There are no restrictions or special instructions.    Follow-Up: At [facility], you and your health needs are our priority.  As part of our continuing mission to provide you with exceptional heart care, we have created designated Provider Care Teams.  These Care Teams include your primary Cardiologist (physician) and Advanced Practice Providers (APPs -  Physician Assistants and Nurse Practitioners) who all work together to provide you with the care you need, when you need it.  We recommend signing up for the patient portal called "MyChart".  Sign up information is provided on this After Visit Summary.  MyChart is used to connect with patients for Virtual Visits (Telemedicine).  Patients are able to view lab/test results, encounter notes, upcoming appointments, etc.  Non-urgent messages can be sent to your provider as well.   To learn more about what you can do with MyChart, go to [email or web address].    Your next appointment:   3 month(s)  The format for your next appointment:    In Person  Provider:   Jenne Campus, MD   Other Instructions

## 2019-07-10 NOTE — Telephone Encounter (Signed)
Left message for patient to return call regarding virtual visit today with Dr. Agustin Cree.

## 2019-07-10 NOTE — Telephone Encounter (Signed)
We are being told patient needs ABI study first before LEA duplex. Will confirm with Dr. Agustin Cree before ordering.

## 2019-07-10 NOTE — Progress Notes (Signed)
Virtual Visit via Telephone Note   This visit type was conducted due to national recommendations for restrictions regarding the COVID-19 Pandemic (e.g. social distancing) in an effort to limit this patient's exposure and mitigate transmission in our community.  Due to her co-morbid illnesses, this patient is at least at moderate risk for complications without adequate follow up.  This format is felt to be most appropriate for this patient at this time.  The patient did not have access to video technology/had technical difficulties with video requiring transitioning to audio format only (telephone).  All issues noted in this document were discussed and addressed.  No physical exam could be performed with this format.  Please refer to the patient's chart for her  consent to telehealth for [facility].  Evaluation Performed:  Follow-up visit  This visit type was conducted due to national recommendations for restrictions regarding the COVID-19 Pandemic (e.g. social distancing).  This format is felt to be most appropriate for this patient at this time.  All issues noted in this document were discussed and addressed.  No physical exam was performed (except for noted visual exam findings with Video Visits).  Please refer to the patient's chart (MyChart message for video visits and phone note for telephone visits) for the patient's consent to telehealth for [facility].  Date:  07/10/2019  ID: Molly [name], Molly [name] 1951-02-20, MRN [id number]   Patient Location: [place] [address]   Provider location:   [place] Office  PCP:  [name], MD  Cardiologist:  Jenne Campus, MD     Chief Complaint: I am doing well  History of Present Illness:    Molly [name] is a 68 y.o. female  who presents via audio/video conferencing for a telehealth visit today.  With past medical history significant for essential hypertension dyslipidemia peripheral vascular  disease in form of lower extremities problem.  Also diabetes.  She has televisit with me today.  Overall she seems to be doing well but she complained of 2 issues #1 problem with sleeping she said she is falling asleep easily during the day as well as pain in her legs when she walks.  Denies have any chest pain tightness squeezing pressure burning chest no shortness of breath out of issue seems to be stable.   The patient does not have symptoms concerning for COVID-19 infection (fever, chills, cough, or new SHORTNESS OF BREATH).    Prior CV studies:   The following studies were reviewed today:       Past Medical History:  Diagnosis Date  . Abdominal aortic atherosclerosis with stenosis 06/29/2015  . AKI (acute kidney injury) ([place]) 06/29/2015  . Cholecystitis 06/29/2015  . Cholelithiasis 06/29/2015  . Dehydration with hyponatremia 06/29/2015  . Diabetes mellitus ([place]) 07/15/2018  . Diabetes mellitus without complication ([place])   . DKA (diabetic ketoacidoses) ([place]) 06/29/2015  . Dyslipidemia 08/30/2018  . HTN (hypertension) 06/29/2015  . Hypertension   . 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 Leukocytosis 06/29/2015  . Osteopenia 10/02/2018  . Other dysphagia 09/10/2018  . Peripheral vascular disease ([place]) 07/15/2018   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  Noncritical  . 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 Proximal leg weakness 09/10/2018  . Ptosis of left eyelid 09/10/2018    Past Surgical History:  Procedure Laterality Date  . CATARACT EXTRACTION, BILATERAL    . CHOLECYSTECTOMY N/A 06/30/2015   Procedure: LAPAROSCOPIC CHOLECYSTECTOMY WITH INTRAOPERATIVE  CHOLANGIOGRAM;  Surgeon: Donnie Mesa, MD;  Location: [facility];  Service: General;  Laterality: N/A;     Current Meds  Medication Sig  . amLODipine (NORVASC) 5 MG tablet TK 1 T PO QD  . aspirin EC 81 MG tablet Take 81 mg by mouth daily.  . Cholecalciferol 25 MCG (1000 UT) tablet  Take 2,000 Units by mouth daily.  Marland Kitchen gabapentin (NEURONTIN) 100 MG capsule Take 100 mg by mouth 3 (three) times daily.  . hydrochlorothiazide (MICROZIDE) 12.5 MG capsule TAKE 1 CAPSULE(12.5 MG) BY MOUTH DAILY  . Insulin Isophane & Regular Human (NOVOLIN 70/30 FLEXPEN) (70-30) 100 UNIT/ML PEN USE 10 UNITS BEFORE BREAKFAST AND 10 UNITS BEFORE EVENING MEAL TWICE A DAY (THIRTY MINUTES BEFORE MEALS)  . Insulin Pen Needle (FIFTY50 PEN NEEDLES) 32G X 4 MM MISC USE AS DIRECTED TWICE DAILY  . ketoconazole (NIZORAL) 2 % cream Apply 1 application topically as needed.  Marland Kitchen levocetirizine (XYZAL) 5 MG tablet Take 1 tablet by mouth at bedtime.  Marland Kitchen losartan (COZAAR) 25 MG tablet Take 1 tablet by mouth daily.  . metoprolol succinate (TOPROL-XL) 50 MG 24 hr tablet Take 1 tablet by mouth daily.  . Pediatric Multivitamins-Iron (FLINTSTONES COMPLETE) 18 MG CHEW Chew by mouth.  . rosuvastatin (CRESTOR) 10 MG tablet TAKE 1 TABLET(10 MG) BY MOUTH DAILY  . triamcinolone cream (KENALOG) 0.1 % Apply 1 application topically as needed.      Family History: The patient's family history includes Breast cancer in her mother; Hypertension in her father and mother.   ROS:   Please see the history of present illness.     All other systems reviewed and are negative.   Labs/Other Tests and Data Reviewed:     Recent Labs: No results found for requested labs within last 8760 hours.  Recent Lipid Panel    Component Value Date/Time   CHOL 152 04/12/2018 1153   TRIG 143 04/12/2018 1153   HDL 46 04/12/2018 1153   CHOLHDL 3.3 04/12/2018 1153   LDLCALC 77 04/12/2018 1153      Exam:    Vital Signs:  Ht 5\' 2"  (1.575 m)   Wt 156 lb (70.8 kg)   BMI 28.53 kg/m     Wt Readings from Last 3 Encounters:  07/10/19 156 lb (70.8 kg)  01/02/19 157 lb (71.2 kg)  08/30/18 153 lb (69.4 kg)     Well nourished, well developed in no acute distress. Alert awake and x3, unable to establish video link, therefore we took over the  phone.  She is not in any distress at the moment of my interview.  Diagnosis for this visit:   1. Vitamin D deficiency   2. Claudication in peripheral vascular disease ([place])   3. Essential hypertension   4. Peripheral vascular disease ([place])   5. Type 2 diabetes mellitus without complication, with long-term current use of insulin ([place])   6. Dyslipidemia      ASSESSMENT & PLAN:    1.  Claudications.  I will schedule her to have arterial duplex evaluation of lower extremities.  In the meantime I encouraged her to walk on a regular basis. 2.  Essential hypertension seems to be well controlled we will continue present management. 3.  Vitamin D3 deficiency she is taking vitamin D3 we will recheck her vitamin D3 level as per her request 4.  Type 2 diabetes followed by internal medicine team doing well from that point review. 5.  Dyslipidemia.  K PN reviewed.  Her cholesterol  is acceptable.  We will continue present management. 6.  Insomnia we spent a great deal of time talking about sleeping hygiene as well as the fact that she is to maintain her routine with sleeping.  I told her not to take naps during the day and simply try to sleep during the night.  COVID-19 Education: The signs and symptoms of COVID-19 were discussed with the patient and how to seek care for testing (follow up with PCP or arrange E-visit).  The importance of social distancing was discussed today.  Patient Risk:   After full review of this patients clinical status, I feel that they are at least moderate risk at this time.  Time:   Today, I have spent 5 minutes with the patient with telehealth technology discussing pt health issues.  I spent 22 minutes reviewing her chart before the visit.  Visit was finished at 11 AM.    Medication Adjustments/Labs and Tests Ordered: Current medicines are reviewed at length with the patient today.  Concerns regarding medicines are outlined above.  Orders Placed This Encounter    Procedures  . CBC  . TSH  . Vitamin D 1,25 dihydroxy  . VAS [place] LOWER EXTREMITY ARTERIAL DUPLEX   Medication changes: No orders of the defined types were placed in this encounter.    Disposition: Follow-up 3 months  Signed, Park Liter, MD, [facility] 07/10/2019 11:41 AM    [place]

## 2019-07-11 NOTE — Telephone Encounter (Signed)
Left message for patient to return call regarding televisit yesterday.

## 2019-07-11 NOTE — Telephone Encounter (Signed)
Usually we do ABI first and after we do arterial

## 2019-07-11 NOTE — Addendum Note (Signed)
Addended by: Linna Hoff R on: 07/11/2019 05:00 PM   Modules accepted: Orders

## 2019-07-15 NOTE — Telephone Encounter (Signed)
Called patient informed her of appointment instructions from televisit last week she was made aware of all appointments as well. No further questions.

## 2019-07-18 ENCOUNTER — Other Ambulatory Visit: Payer: Self-pay

## 2019-07-18 ENCOUNTER — Ambulatory Visit (HOSPITAL_BASED_OUTPATIENT_CLINIC_OR_DEPARTMENT_OTHER)
Admission: RE | Admit: 2019-07-18 | Discharge: 2019-07-18 | Disposition: A | Discharge status: Home / Self Care | Payer: Medicare Other | Source: Ambulatory Visit | Attending: Cardiology | Admitting: Cardiology

## 2019-07-18 DIAGNOSIS — I10 Essential (primary) hypertension: Secondary | ICD-10-CM

## 2019-07-18 DIAGNOSIS — I739 Peripheral vascular disease, unspecified: Secondary | ICD-10-CM

## 2019-07-18 NOTE — Progress Notes (Signed)
VAS [place] LOWER EXTREMITY ARTERIAL BILATERAL    07/18/19 Cardell Peach

## 2019-07-18 NOTE — Progress Notes (Signed)
ABI Doppler Performed    07/18/19 Cardell Peach RDCS, RVT

## 2019-07-24 ENCOUNTER — Telehealth: Payer: Self-pay | Admitting: Cardiology

## 2019-07-24 DIAGNOSIS — I999 Unspecified disorder of circulatory system: Secondary | ICD-10-CM

## 2019-07-24 NOTE — Telephone Encounter (Signed)
Best approach will be to schedule her to see vascular interventionalists.  So if she agrees please schedule her to see Dr. Donnella Bi

## 2019-07-24 NOTE — Telephone Encounter (Signed)
Patient called back to go over vascular ultrasound results. Patient was informed of results and verbalized understanding. She states that she is in sever pain and walking is becoming very difficult and she would like to discuss treatment options with Dr. Agustin Cree sooner than her appointment in July.

## 2019-07-24 NOTE — Telephone Encounter (Signed)
Follow up   Pt is returning call    

## 2019-07-24 NOTE — Telephone Encounter (Signed)
Left message for patient notifying her of lab results, however Vit D3 is still pending, we will let her know when those results come back.

## 2019-07-24 NOTE — Telephone Encounter (Signed)
New message  Patient is calling in to get results from lab work. Please give patient a call back to assist.

## 2019-07-25 LAB — CBC
Hematocrit: 40.2 % (ref 34.0–46.6)
Hemoglobin: 13.1 g/dL (ref 11.1–15.9)
MCH: 28 pg (ref 26.6–33.0)
MCHC: 32.6 g/dL (ref 31.5–35.7)
MCV: 86 fL (ref 79–97)
Platelets: 295 10*3/uL (ref 150–450)
RBC: 4.68 x10E6/uL (ref 3.77–5.28)
RDW: 12.8 % (ref 11.7–15.4)
WBC: 6.3 10*3/uL (ref 3.4–10.8)

## 2019-07-25 LAB — VITAMIN D 1,25 DIHYDROXY
Vitamin D 1, 25 (OH)2 Total: 33 pg/mL
Vitamin D2 1, 25 (OH)2: <10 pg/mL
Vitamin D3 1, 25 (OH)2: 33 pg/mL

## 2019-07-25 LAB — TSH: TSH: 3.98 u[IU]/mL (ref 0.450–4.500)

## 2019-07-25 NOTE — Telephone Encounter (Signed)
Patient returning call.

## 2019-07-25 NOTE — Addendum Note (Signed)
Addended by: Ashok Norris on: 07/25/2019 05:20 PM   Modules accepted: Orders

## 2019-07-25 NOTE — Telephone Encounter (Signed)
Left message for patient to return call.

## 2019-07-25 NOTE — Telephone Encounter (Signed)
Referral placed.

## 2019-07-25 NOTE — Telephone Encounter (Signed)
Patient informed that we will refer her to Dr. Gwenlyn Found she verbally understood no further questions.

## 2019-07-31 ENCOUNTER — Telehealth: Payer: Self-pay

## 2019-07-31 NOTE — Telephone Encounter (Signed)
Spoke with patient regarding results.  Patient verbalizes understanding and is agreeable to plan of care. Advised patient to call back with any issues or concerns.  

## 2019-07-31 NOTE — Telephone Encounter (Signed)
-----   Message from Park Liter, MD sent at 07/31/2019 12:39 PM EDT ----- CBC looks good, she is not anemic

## 2019-08-04 ENCOUNTER — Ambulatory Visit: Payer: Medicare Other | Admitting: Podiatry

## 2019-08-06 ENCOUNTER — Encounter: Payer: Self-pay | Admitting: Podiatry

## 2019-08-06 ENCOUNTER — Other Ambulatory Visit: Payer: Self-pay

## 2019-08-06 ENCOUNTER — Ambulatory Visit: Payer: Medicare Other | Admitting: Podiatry

## 2019-08-06 VITALS — Temp 98.0°F

## 2019-08-06 DIAGNOSIS — M79675 Pain in left toe(s): Secondary | ICD-10-CM | POA: Diagnosis not present

## 2019-08-06 DIAGNOSIS — M79674 Pain in right toe(s): Secondary | ICD-10-CM | POA: Diagnosis not present

## 2019-08-06 DIAGNOSIS — B351 Tinea unguium: Secondary | ICD-10-CM | POA: Diagnosis not present

## 2019-08-06 DIAGNOSIS — E1142 Type 2 diabetes mellitus with diabetic polyneuropathy: Secondary | ICD-10-CM | POA: Diagnosis not present

## 2019-08-06 NOTE — Progress Notes (Signed)
Subjective:   Patient ID: Molly [name], female   DOB: 69 y.o.   MRN: [id number]   HPI Patient presents with chronic nail disease 1-5 both feet that she cannot take care of and has had previous biopsy that was benign of the left third nail   ROS      Objective:  Physical Exam  Neurovascular status intact with thick incurvated nailbeds 1-5 both feet that are irritated for the patient with long-term history of diabetes     Assessment:  Chronic mycotic nail infection 1-5 both feet with risk     Plan:  Debridement nailbeds 1-5 both feet no iatrogenic bleeding noted

## 2019-08-19 ENCOUNTER — Encounter: Payer: Self-pay | Admitting: Cardiovascular Disease

## 2019-08-19 ENCOUNTER — Ambulatory Visit: Payer: Medicare Other | Admitting: Cardiovascular Disease

## 2019-08-19 ENCOUNTER — Other Ambulatory Visit: Payer: Self-pay

## 2019-08-19 DIAGNOSIS — I739 Peripheral vascular disease, unspecified: Secondary | ICD-10-CM | POA: Diagnosis not present

## 2019-08-19 LAB — CBC
Hematocrit: 39.4 % (ref 34.0–46.6)
Hemoglobin: 12.5 g/dL (ref 11.1–15.9)
MCH: 27.1 pg (ref 26.6–33.0)
MCHC: 31.7 g/dL (ref 31.5–35.7)
MCV: 86 fL (ref 79–97)
Platelets: 299 10*3/uL (ref 150–450)
RBC: 4.61 x10E6/uL (ref 3.77–5.28)
RDW: 12.7 % (ref 11.7–15.4)
WBC: 5.5 10*3/uL (ref 3.4–10.8)

## 2019-08-19 LAB — BASIC METABOLIC PANEL
BUN/Creatinine Ratio: 16 (ref 12–28)
BUN: 17 mg/dL (ref 8–27)
CO2: 28 mmol/L (ref 20–29)
Calcium: 9.1 mg/dL (ref 8.7–10.3)
Chloride: 103 mmol/L (ref 96–106)
Creatinine, Ser: 1.08 mg/dL — ABNORMAL HIGH (ref 0.57–1.00)
GFR calc Af Amer: 62 mL/min/{1.73_m2} (ref >59)
GFR calc non Af Amer: 54 mL/min/{1.73_m2} — ABNORMAL LOW (ref >59)
Glucose: 104 mg/dL — ABNORMAL HIGH (ref 65–99)
Potassium: 4.6 mmol/L (ref 3.5–5.2)
Sodium: 141 mmol/L (ref 134–144)

## 2019-08-19 MED ORDER — SODIUM CHLORIDE 0.9% FLUSH: 3.0000 mL | Freq: Two times a day (BID) | INTRAVENOUS | Status: DC

## 2019-08-19 NOTE — Progress Notes (Signed)
08/19/2019 Molly [name]   05-Sep-1950  [id number]  Primary Physician [name], MD Primary Cardiologist: Lorretta Harp MD Lupe Carney, [place]  HPI:  Molly [name] is a 69 y.o. moderately overweight married African-American female with no children referred by Dr. Agustin Cree for evaluation treatment of symptomatic PAD.  She is retired from being a Estate manager/land agent.  She is never smoked cigarettes.  Her risk factors include treated hypertension, diabetes and hyperlipidemia.  Her brother had an MI in his 31s.  She is never had heart attack or stroke.  She denies chest pain or shortness of breath.  She is a claudication which is somewhat symmetric over the last year which is lifestyle limiting.  Recent Doppler studies performed 07/10/2019 showed high-grade or occluded distal SFA disease bilaterally.   Current Meds  Medication Sig  . amLODipine (NORVASC) 5 MG tablet TK 1 T PO QD  . aspirin EC 81 MG tablet Take 81 mg by mouth daily.  . Cholecalciferol 25 MCG (1000 UT) tablet Take 2,000 Units by mouth daily.  Marland Kitchen gabapentin (NEURONTIN) 100 MG capsule Take 100 mg by mouth 3 (three) times daily.  . hydrochlorothiazide (MICROZIDE) 12.5 MG capsule TAKE 1 CAPSULE(12.5 MG) BY MOUTH DAILY  . Insulin Isophane & Regular Human (NOVOLIN 70/30 FLEXPEN) (70-30) 100 UNIT/ML PEN USE 10 UNITS BEFORE BREAKFAST AND 10 UNITS BEFORE EVENING MEAL TWICE A DAY (THIRTY MINUTES BEFORE MEALS)  . Insulin Pen Needle (FIFTY50 PEN NEEDLES) 32G X 4 MM MISC USE AS DIRECTED TWICE DAILY  . ketoconazole (NIZORAL) 2 % cream Apply 1 application topically as needed.  Marland Kitchen levocetirizine (XYZAL) 5 MG tablet Take 1 tablet by mouth at bedtime.  Marland Kitchen losartan (COZAAR) 25 MG tablet Take 1 tablet by mouth daily.  . metoprolol succinate (TOPROL-XL) 50 MG 24 hr tablet Take 1 tablet by mouth daily.  . Pediatric Multivitamins-Iron (FLINTSTONES COMPLETE) 18 MG CHEW Chew by mouth.  . rosuvastatin (CRESTOR) 10 MG tablet TAKE 1  TABLET(10 MG) BY MOUTH DAILY  . triamcinolone cream (KENALOG) 0.1 % Apply 1 application topically as needed.     Allergies  Allergen Reactions  . Mercury Nausea And Vomiting    Social History   Socioeconomic History  . Marital status: Married    Spouse name: Not on file  . Number of children: Not on file  . Years of education: Not on file  . Highest education level: Not on file  Occupational History  . Not on file  Tobacco Use  . Smoking status: Never Smoker  . Smokeless tobacco: Never Used  Substance and Sexual Activity  . Alcohol use: No  . Drug use: Never  . Sexual activity: Not on file  Other Topics Concern  . Not on file  Social History Narrative  . Not on file   Social Determinants of Health   Financial Resource Strain:   . Difficulty of Paying Living Expenses:   Food Insecurity:   . Worried About Charity fundraiser in the Last Year:   . Arboriculturist in the Last Year:   Transportation Needs:   . Film/video editor (Medical):   Marland Kitchen Lack of Transportation (Non-Medical):   Physical Activity:   . Days of Exercise per Week:   . Minutes of Exercise per Session:   Stress:   . Feeling of Stress :   Social Connections:   . Frequency of Communication with Friends and Family:   . Frequency of Social Gatherings  with Friends and Family:   . Attends Religious Services:   . Active Member of Clubs or Organizations:   . Attends Archivist Meetings:   Marland Kitchen Marital Status:   Intimate Partner Violence:   . Fear of Current or Ex-Partner:   . Emotionally Abused:   Marland Kitchen Physically Abused:   . Sexually Abused:      Review of Systems: General: negative for chills, fever, night sweats or weight changes.  Cardiovascular: negative for chest pain, dyspnea on exertion, edema, orthopnea, palpitations, paroxysmal nocturnal dyspnea or shortness of breath Dermatological: negative for rash Respiratory: negative for cough or wheezing Urologic: negative for  hematuria Abdominal: negative for nausea, vomiting, diarrhea, bright red blood per rectum, melena, or hematemesis Neurologic: negative for visual changes, syncope, or dizziness All other systems reviewed and are otherwise negative except as noted above.    Blood pressure 118/62, pulse 66, height 5\' 2"  (1.575 m), weight 161 lb 12.8 oz (73.4 kg).  General appearance: alert and no distress Neck: no adenopathy, no JVD, supple, symmetrical, trachea midline, thyroid not enlarged, symmetric, no tenderness/mass/nodules and High-pitched soft bilateral carotid bruits Lungs: clear to auscultation bilaterally Heart: regular rate and rhythm, S1, S2 normal, no murmur, click, rub or gallop Extremities: extremities normal, atraumatic, no cyanosis or edema Pulses: Diminished pedal pulses Skin: Skin color, texture, turgor normal. No rashes or lesions Neurologic: Alert and oriented X 3, normal strength and tone. Normal symmetric reflexes. Normal coordination and gait  EKG sinus rhythm 66 with left axis deviation nonspecific ST and T wave changes.  There were septal Q waves noted.  I personally reviewed this EKG.  ASSESSMENT AND PLAN:   Peripheral vascular disease [facility]) Ms. Taulbee was referred to me by Dr. Agustin Cree for symptomatic PAD.  She has risk factors that include treated hypertension, diabetes, hyperlipidemia and family history.  She had recent Doppler studies performed 07/10/2019 that showed bilateral SFA disease and wishes to proceed with endovascular therapy for lifestyle limiting claudication.      Lorretta Harp MD FACP,FACC,FAHA, [facility] 08/19/2019 10:42 AM

## 2019-08-19 NOTE — Patient Instructions (Addendum)
    [address] [place] [address] Dept: [phone] Loc: [phone]  Molly [name]  08/19/2019  You are scheduled for a Peripheral Angiogram on Thursday, May 13 with Dr. Quay Burow.  1. Please arrive at the [facility] (Main Entrance A) at [facility]: [address] [place], [place] [address] at 5:30 AM (This time is two hours before your procedure to ensure your preparation). Free valet parking service is available.   Special note: Every effort is made to have your procedure done on time. Please understand that emergencies sometimes delay scheduled procedures.  2. Diet: Do not eat solid foods after midnight.  The patient may have clear liquids until 5am upon the day of the procedure.  3. Labs: You will need to have blood drawn on TODAY  GO TO [place] ON Monday 08-1019 @ 12:05 AM FOR COVID TESTING  4. Medication instructions in preparation for your procedure:  TAKE 1/2 DOSE OF EVENING INSULIN Wednesday 5-12 AND TAKE NO INSULIN THE MORNING OF THE PROCEDURE  On the morning of your procedure, take your Aspirin and any morning medicines NOT listed above.  You may use sips of water.  5. Plan for one night stay--bring personal belongings. 6. Bring a current list of your medications and current insurance cards. 7. You MUST have a responsible person to drive you home. 8. Someone MUST be with you the first 24 hours after you arrive home or your discharge will be delayed. 9. Please wear clothes that are easy to get on and off and wear slip-on shoes.  Thank you for allowing [place] to care for you!   -- [facility]   Your physician has requested that you have a lower or upper extremity arterial duplex. This test is an ultrasound of the arteries in the legs or arms. It looks at arterial blood flow in the legs and arms. Allow one hour for  Lower and Upper Arterial scans. There are no restrictions or special instructions ONE WEEK AFTER PROCEDURE   Your physician recommends that you schedule a follow-up appointment in: 2 [place]

## 2019-08-19 NOTE — Assessment & Plan Note (Signed)
Molly [name] was referred to me by Dr. Agustin Cree for symptomatic PAD.  She has risk factors that include treated hypertension, diabetes, hyperlipidemia and family history.  She had recent Doppler studies performed 07/10/2019 that showed bilateral SFA disease and wishes to proceed with endovascular therapy for lifestyle limiting claudication.

## 2019-08-21 DIAGNOSIS — H6123 Impacted cerumen, bilateral: Secondary | ICD-10-CM | POA: Insufficient documentation

## 2019-08-21 HISTORY — DX: Impacted cerumen, bilateral: H61.23

## 2019-08-25 ENCOUNTER — Other Ambulatory Visit: Payer: Self-pay | Admitting: Cardiovascular Disease

## 2019-08-25 ENCOUNTER — Other Ambulatory Visit (HOSPITAL_COMMUNITY)
Admission: RE | Admit: 2019-08-25 | Discharge: 2019-08-25 | Disposition: A | Discharge status: Home / Self Care | Payer: Medicare Other | Source: Ambulatory Visit | Attending: Cardiovascular Disease | Admitting: Cardiovascular Disease

## 2019-08-25 DIAGNOSIS — Z01812 Encounter for preprocedural laboratory examination: Secondary | ICD-10-CM | POA: Insufficient documentation

## 2019-08-25 DIAGNOSIS — Z20822 Contact with and (suspected) exposure to covid-19: Secondary | ICD-10-CM | POA: Diagnosis not present

## 2019-08-25 DIAGNOSIS — I739 Peripheral vascular disease, unspecified: Secondary | ICD-10-CM

## 2019-08-25 LAB — SARS CORONAVIRUS 2 (TAT 6-24 HRS): SARS Coronavirus 2: NEGATIVE

## 2019-08-26 ENCOUNTER — Telehealth: Payer: Self-pay | Admitting: *Deleted

## 2019-08-26 NOTE — Telephone Encounter (Addendum)
Pt contacted pre-abdominal aortogram  scheduled at [facility] for: Thursday Aug 28, 2019 7:30 AM Verified arrival time and place: [facility]) at: 5:30 AM  No solid food after midnight prior to cath, clear liquids until 5 AM day of procedure.  Hold: Insulin-pt states she does not take in AM or HS Pt knows not to take any diabetes medications morning of procedure. HCTZ-AM of procedure-pt states not taking  Except hold medications AM meds can be  taken pre-cath with sip of water including: ASA 81 mg   Confirmed patient has responsible adult to drive home post procedure and observe 24 hours after arriving home:   You are allowed ONE visitor in the waiting room during your procedure. Both you and your visitor must wear masks.     COVID-19 Pre-Screening Questions:  . In the past 7 to 10 days have you had a cough,  shortness of breath, headache, congestion, fever (100 or greater) body aches, chills, sore throat, or sudden loss of taste or sense of smell? no . Have you been around anyone with known Covid 19 in the past 7 to 10 days? no . Have you been around anyone who is awaiting Covid 19 test results in the past 7 to 10 days? no . Have you been around anyone who has mentioned symptoms of Covid 19 within the past 7 to 10 days? No   Reviewed procedure/mask/visitor instructions, COVID-19 screening questions with patient.

## 2019-08-27 ENCOUNTER — Encounter (HOSPITAL_COMMUNITY): Payer: Medicare Other

## 2019-08-28 ENCOUNTER — Other Ambulatory Visit: Payer: Self-pay

## 2019-08-28 ENCOUNTER — Encounter (HOSPITAL_COMMUNITY)
Admission: RE | Disposition: A | Discharge status: Home / Self Care | Payer: Self-pay | Source: Home / Self Care | Attending: Cardiovascular Disease

## 2019-08-28 ENCOUNTER — Ambulatory Visit (HOSPITAL_COMMUNITY)
Admission: RE | Admit: 2019-08-28 | Discharge: 2019-08-29 | Disposition: A | Discharge status: Home / Self Care | Payer: Medicare Other | Attending: Cardiovascular Disease | Admitting: Cardiovascular Disease

## 2019-08-28 DIAGNOSIS — Z794 Long term (current) use of insulin: Secondary | ICD-10-CM | POA: Diagnosis not present

## 2019-08-28 DIAGNOSIS — Z79899 Other long term (current) drug therapy: Secondary | ICD-10-CM | POA: Insufficient documentation

## 2019-08-28 DIAGNOSIS — E785 Hyperlipidemia, unspecified: Secondary | ICD-10-CM | POA: Diagnosis not present

## 2019-08-28 DIAGNOSIS — I739 Peripheral vascular disease, unspecified: Secondary | ICD-10-CM | POA: Diagnosis present

## 2019-08-28 DIAGNOSIS — I70212 Atherosclerosis of native arteries of extremities with intermittent claudication, left leg: Secondary | ICD-10-CM | POA: Diagnosis present

## 2019-08-28 DIAGNOSIS — I10 Essential (primary) hypertension: Secondary | ICD-10-CM | POA: Diagnosis not present

## 2019-08-28 DIAGNOSIS — Z7982 Long term (current) use of aspirin: Secondary | ICD-10-CM | POA: Diagnosis not present

## 2019-08-28 DIAGNOSIS — E1151 Type 2 diabetes mellitus with diabetic peripheral angiopathy without gangrene: Secondary | ICD-10-CM | POA: Insufficient documentation

## 2019-08-28 HISTORY — PX: ABDOMINAL AORTOGRAM W/LOWER EXTREMITY: CATH118223

## 2019-08-28 HISTORY — PX: PERIPHERAL VASCULAR BALLOON ANGIOPLASTY: CATH118281

## 2019-08-28 HISTORY — DX: Peripheral vascular disease, unspecified: I73.9

## 2019-08-28 LAB — POCT ACTIVATED CLOTTING TIME
Activated Clotting Time: 257 seconds
Activated Clotting Time: 285 seconds

## 2019-08-28 LAB — GLUCOSE, CAPILLARY
Glucose-Capillary: 57 mg/dL — ABNORMAL LOW (ref 70–99)
Glucose-Capillary: 93 mg/dL (ref 70–99)
Glucose-Capillary: 85 mg/dL (ref 70–99)
Glucose-Capillary: 93 mg/dL (ref 70–99)
Glucose-Capillary: 81 mg/dL (ref 70–99)
Glucose-Capillary: 115 mg/dL — ABNORMAL HIGH (ref 70–99)
Glucose-Capillary: 73 mg/dL (ref 70–99)

## 2019-08-28 SURGERY — ABDOMINAL AORTOGRAM W/LOWER EXTREMITY
Anesthesia: LOCAL | Laterality: Right

## 2019-08-28 MED ORDER — ASPIRIN EC 81 MG PO TBEC: 81.0000 mg | DELAYED_RELEASE_TABLET | Freq: Every day | ORAL | Status: DC

## 2019-08-28 MED ORDER — SODIUM CHLORIDE 0.9% FLUSH: 3.0000 mL | INTRAVENOUS | Status: DC | PRN

## 2019-08-28 MED ORDER — DEXTROSE 50 % IV SOLN
INTRAVENOUS | Status: DC | PRN
  Administered 2019-08-28: 25 mL via INTRAVENOUS

## 2019-08-28 MED ORDER — AMLODIPINE BESYLATE 5 MG PO TABS
5.0000 mg | ORAL_TABLET | Freq: Every day | ORAL | Status: DC
  Administered 2019-08-28: 5 mg via ORAL
  Filled 2019-08-28: qty 1

## 2019-08-28 MED ORDER — SODIUM CHLORIDE 0.9 % IV SOLN: INTRAVENOUS | Status: AC

## 2019-08-28 MED ORDER — MIDAZOLAM HCL 2 MG/2ML IJ SOLN
INTRAMUSCULAR | Status: AC
  Filled 2019-08-28: qty 2

## 2019-08-28 MED ORDER — ATORVASTATIN CALCIUM 80 MG PO TABS: 80.0000 mg | ORAL_TABLET | Freq: Every day | ORAL | Status: DC

## 2019-08-28 MED ORDER — SODIUM CHLORIDE 0.9 % IV SOLN: 250.0000 mL | INTRAVENOUS | Status: DC | PRN

## 2019-08-28 MED ORDER — DEXTROSE 50 % IV SOLN
INTRAVENOUS | Status: AC
  Administered 2019-08-28: 12.5 g via INTRAVENOUS
  Filled 2019-08-28: qty 50

## 2019-08-28 MED ORDER — FENTANYL CITRATE (PF) 100 MCG/2ML IJ SOLN
INTRAMUSCULAR | Status: DC | PRN
  Administered 2019-08-28: 25 ug via INTRAVENOUS

## 2019-08-28 MED ORDER — INSULIN ISOPHANE & REGULAR (HUMAN 70-30)100 UNIT/ML KWIKPEN: 20.0000 [IU] | PEN_INJECTOR | Freq: Every day | SUBCUTANEOUS | Status: DC

## 2019-08-28 MED ORDER — LABETALOL HCL 5 MG/ML IV SOLN: 10.0000 mg | INTRAVENOUS | Status: DC | PRN

## 2019-08-28 MED ORDER — ROSUVASTATIN CALCIUM 5 MG PO TABS
10.0000 mg | ORAL_TABLET | Freq: Every day | ORAL | Status: DC
  Administered 2019-08-28: 10 mg via ORAL
  Filled 2019-08-28: qty 2

## 2019-08-28 MED ORDER — HEPARIN SODIUM (PORCINE) 1000 UNIT/ML IJ SOLN
INTRAMUSCULAR | Status: DC | PRN
  Administered 2019-08-28: 8000 [IU] via INTRAVENOUS
  Administered 2019-08-28: 3000 [IU] via INTRAVENOUS

## 2019-08-28 MED ORDER — HYDRALAZINE HCL 20 MG/ML IJ SOLN
INTRAMUSCULAR | Status: DC | PRN
  Administered 2019-08-28: 10 mg via INTRAVENOUS

## 2019-08-28 MED ORDER — LIDOCAINE HCL (PF) 1 % IJ SOLN
INTRAMUSCULAR | Status: DC | PRN
  Administered 2019-08-28: 28 mL

## 2019-08-28 MED ORDER — HEPARIN SODIUM (PORCINE) 1000 UNIT/ML IJ SOLN
INTRAMUSCULAR | Status: AC
  Filled 2019-08-28: qty 1

## 2019-08-28 MED ORDER — SODIUM CHLORIDE 0.9% FLUSH
3.0000 mL | Freq: Two times a day (BID) | INTRAVENOUS | Status: DC
  Administered 2019-08-29: 3 mL via INTRAVENOUS

## 2019-08-28 MED ORDER — ONDANSETRON HCL 4 MG/2ML IJ SOLN: 4.0000 mg | Freq: Four times a day (QID) | INTRAMUSCULAR | Status: DC | PRN

## 2019-08-28 MED ORDER — SODIUM CHLORIDE 0.9 % WEIGHT BASED INFUSION: 1.0000 mL/kg/h | INTRAVENOUS | Status: DC

## 2019-08-28 MED ORDER — HYDROCHLOROTHIAZIDE 12.5 MG PO CAPS: 12.5000 mg | ORAL_CAPSULE | Freq: Every day | ORAL | Status: DC

## 2019-08-28 MED ORDER — ACETAMINOPHEN 325 MG PO TABS: 650.0000 mg | ORAL_TABLET | ORAL | Status: DC | PRN

## 2019-08-28 MED ORDER — HYDRALAZINE HCL 20 MG/ML IJ SOLN
INTRAMUSCULAR | Status: AC
  Filled 2019-08-28: qty 1

## 2019-08-28 MED ORDER — LIDOCAINE HCL (PF) 1 % IJ SOLN
INTRAMUSCULAR | Status: AC
  Filled 2019-08-28: qty 30

## 2019-08-28 MED ORDER — MIDAZOLAM HCL 2 MG/2ML IJ SOLN
INTRAMUSCULAR | Status: DC | PRN
  Administered 2019-08-28: 1 mg via INTRAVENOUS

## 2019-08-28 MED ORDER — ASPIRIN 81 MG PO CHEW: 81.0000 mg | CHEWABLE_TABLET | ORAL | Status: DC

## 2019-08-28 MED ORDER — INSULIN ASPART PROT & ASPART (70-30 MIX) 100 UNIT/ML ~~LOC~~ SUSP
20.0000 [IU] | Freq: Every day | SUBCUTANEOUS | Status: DC
  Administered 2019-08-28: 20 [IU] via SUBCUTANEOUS
  Filled 2019-08-28: qty 10

## 2019-08-28 MED ORDER — DEXTROSE 50 % IV SOLN: 12.5000 g | INTRAVENOUS | Status: AC

## 2019-08-28 MED ORDER — SODIUM CHLORIDE 0.9 % WEIGHT BASED INFUSION
3.0000 mL/kg/h | INTRAVENOUS | Status: AC
  Administered 2019-08-28: 3 mL/kg/h via INTRAVENOUS

## 2019-08-28 MED ORDER — HEPARIN (PORCINE) IN NACL 1000-0.9 UT/500ML-% IV SOLN
INTRAVENOUS | Status: DC | PRN
  Administered 2019-08-28 (×2): 500 mL

## 2019-08-28 MED ORDER — IODIXANOL 320 MG/ML IV SOLN
INTRAVENOUS | Status: DC | PRN
  Administered 2019-08-28: 205 mL

## 2019-08-28 MED ORDER — HEPARIN (PORCINE) IN NACL 1000-0.9 UT/500ML-% IV SOLN
INTRAVENOUS | Status: AC
  Filled 2019-08-28: qty 1000

## 2019-08-28 MED ORDER — HYDRALAZINE HCL 20 MG/ML IJ SOLN: 5.0000 mg | INTRAMUSCULAR | Status: DC | PRN

## 2019-08-28 MED ORDER — MORPHINE SULFATE (PF) 2 MG/ML IV SOLN: 2.0000 mg | INTRAVENOUS | Status: DC | PRN

## 2019-08-28 MED ORDER — FENTANYL CITRATE (PF) 100 MCG/2ML IJ SOLN
INTRAMUSCULAR | Status: AC
  Filled 2019-08-28: qty 2

## 2019-08-28 MED ORDER — LOSARTAN POTASSIUM 25 MG PO TABS
25.0000 mg | ORAL_TABLET | Freq: Every day | ORAL | Status: DC
  Administered 2019-08-28: 25 mg via ORAL
  Filled 2019-08-28: qty 1

## 2019-08-28 MED ORDER — METOPROLOL SUCCINATE ER 50 MG PO TB24
50.0000 mg | ORAL_TABLET | Freq: Every day | ORAL | Status: DC
  Administered 2019-08-28: 50 mg via ORAL
  Filled 2019-08-28: qty 1

## 2019-08-28 SURGICAL SUPPLY — 27 items
CATH ANGIO 5F PIGTAIL 65CM (CATHETERS) ×1 IMPLANT
CATH CROSS OVER TEMPO 5F (CATHETERS) ×1 IMPLANT
CATH CXI SUPP 2.6F 150 ANG (CATHETERS) ×1 IMPLANT
CATH QUICKCROSS .035X135CM (MICROCATHETER) ×1 IMPLANT
CATH VIANCE CROSS STAND 150CM (MICROCATHETER) ×3
CATH VIANCE CROSS STD 150CM (MICROCATHETER) IMPLANT
CLOSURE MYNX CONTROL 6F/7F (Vascular Products) ×1 IMPLANT
DEVICE TORQUE .025-.038 (MISCELLANEOUS) ×1 IMPLANT
GLIDEWIRE ANGLED SS 035X260CM (WIRE) IMPLANT
GUIDEWIRE ANGLED .035X150CM (WIRE) ×1 IMPLANT
GUIDEWIRE ZILIENT 12G 018 (WIRE) ×1 IMPLANT
GUIDEWIRE ZILIENT 6G 014 (WIRE) ×1 IMPLANT
KIT ESSENTIALS PG (KITS) ×1 IMPLANT
KIT PV (KITS) ×3 IMPLANT
SHEATH HIGHFLEX ANSEL 7FR 55CM (SHEATH) ×1 IMPLANT
SHEATH PINNACLE 5F 10CM (SHEATH) ×1 IMPLANT
SHEATH PINNACLE 7F 10CM (SHEATH) ×1 IMPLANT
SHEATH PROBE COVER 6X72 (BAG) ×1 IMPLANT
STOPCOCK MORSE 400PSI 3WAY (MISCELLANEOUS) ×1 IMPLANT
SYR MEDRAD MARK 7 150ML (SYRINGE) ×3 IMPLANT
TAPE VIPERTRACK RADIOPAQ (MISCELLANEOUS) IMPLANT
TAPE VIPERTRACK RADIOPAQUE (MISCELLANEOUS) ×3
TRANSDUCER W/STOPCOCK (MISCELLANEOUS) ×3 IMPLANT
TRAY PV CATH (CUSTOM PROCEDURE TRAY) ×3 IMPLANT
TUBING CIL FLEX 10 FLL-RA (TUBING) ×1 IMPLANT
WIRE HITORQ VERSACORE ST 145CM (WIRE) ×1 IMPLANT
WIRE ROSEN-J .035X180CM (WIRE) ×1 IMPLANT

## 2019-08-28 NOTE — Plan of Care (Signed)
  Problem: Education: Goal: Knowledge of General Education information will improve Description: Including pain rating scale, medication(s)/side effects and non-pharmacologic comfort measures 08/28/2019 1647 by Lasandra Beech, RN Outcome: Progressing 08/28/2019 1646 by Lasandra Beech, RN Outcome: Progressing   Problem: Health Behavior/Discharge Planning: Goal: Ability to manage health-related needs will improve 08/28/2019 1647 by Lasandra Beech, RN Outcome: Progressing 08/28/2019 1646 by Lasandra Beech, RN Outcome: Progressing   Problem: Clinical Measurements: Goal: Ability to maintain clinical measurements within normal limits will improve 08/28/2019 1647 by Lasandra Beech, RN Outcome: Progressing 08/28/2019 1646 by Lasandra Beech, RN Outcome: Progressing Goal: Will remain free from infection 08/28/2019 1647 by Lasandra Beech, RN Outcome: Progressing 08/28/2019 1646 by Lasandra Beech, RN Outcome: Progressing   Problem: Activity: Goal: Risk for activity intolerance will decrease Outcome: Progressing   Problem: Nutrition: Goal: Adequate nutrition will be maintained 08/28/2019 1647 by Lasandra Beech, RN Outcome: Progressing 08/28/2019 1646 by Lasandra Beech, RN Outcome: Progressing

## 2019-08-29 ENCOUNTER — Other Ambulatory Visit: Payer: Self-pay | Admitting: Physician Assistant

## 2019-08-29 DIAGNOSIS — I70212 Atherosclerosis of native arteries of extremities with intermittent claudication, left leg: Secondary | ICD-10-CM | POA: Diagnosis not present

## 2019-08-29 DIAGNOSIS — Z79899 Other long term (current) drug therapy: Secondary | ICD-10-CM

## 2019-08-29 DIAGNOSIS — N179 Acute kidney failure, unspecified: Secondary | ICD-10-CM

## 2019-08-29 DIAGNOSIS — I739 Peripheral vascular disease, unspecified: Secondary | ICD-10-CM | POA: Diagnosis not present

## 2019-08-29 LAB — GLUCOSE, CAPILLARY
Glucose-Capillary: 49 mg/dL — ABNORMAL LOW (ref 70–99)
Glucose-Capillary: 46 mg/dL — ABNORMAL LOW (ref 70–99)
Glucose-Capillary: 53 mg/dL — ABNORMAL LOW (ref 70–99)
Glucose-Capillary: 132 mg/dL — ABNORMAL HIGH (ref 70–99)
Glucose-Capillary: 165 mg/dL — ABNORMAL HIGH (ref 70–99)
Glucose-Capillary: 166 mg/dL — ABNORMAL HIGH (ref 70–99)

## 2019-08-29 LAB — BASIC METABOLIC PANEL
Anion gap: 10 (ref 5–15)
BUN: 26 mg/dL — ABNORMAL HIGH (ref 8–23)
CO2: 24 mmol/L (ref 22–32)
Calcium: 8.1 mg/dL — ABNORMAL LOW (ref 8.9–10.3)
Chloride: 106 mmol/L (ref 98–111)
Creatinine, Ser: 1.49 mg/dL — ABNORMAL HIGH (ref 0.44–1.00)
GFR calc Af Amer: 42 mL/min — ABNORMAL LOW (ref >60)
GFR calc non Af Amer: 36 mL/min — ABNORMAL LOW (ref >60)
Glucose, Bld: 155 mg/dL — ABNORMAL HIGH (ref 70–99)
Potassium: 4.3 mmol/L (ref 3.5–5.1)
Sodium: 140 mmol/L (ref 135–145)

## 2019-08-29 LAB — CBC
HCT: 36.6 % (ref 36.0–46.0)
Hemoglobin: 11.3 g/dL — ABNORMAL LOW (ref 12.0–15.0)
MCH: 27.6 pg (ref 26.0–34.0)
MCHC: 30.9 g/dL (ref 30.0–36.0)
MCV: 89.5 fL (ref 80.0–100.0)
Platelets: 240 10*3/uL (ref 150–400)
RBC: 4.09 MIL/uL (ref 3.87–5.11)
RDW: 13.7 % (ref 11.5–15.5)
WBC: 7.2 10*3/uL (ref 4.0–10.5)
nRBC: 0 % (ref 0.0–0.2)

## 2019-08-29 MED ORDER — AMLODIPINE BESYLATE 10 MG PO TABS: 10.0000 mg | ORAL_TABLET | Freq: Every day | ORAL | Status: DC

## 2019-08-29 MED ORDER — SODIUM CHLORIDE 0.9 % IV BOLUS
500.0000 mL | Freq: Once | INTRAVENOUS | Status: AC
  Administered 2019-08-29: 500 mL via INTRAVENOUS

## 2019-08-29 MED ORDER — DEXTROSE 50 % IV SOLN
INTRAVENOUS | Status: AC
  Administered 2019-08-29: 50 mL
  Filled 2019-08-29: qty 50

## 2019-08-29 MED ORDER — ROSUVASTATIN CALCIUM 20 MG PO TABS: 20.0000 mg | ORAL_TABLET | Freq: Every day | ORAL | Status: DC

## 2019-08-29 MED ORDER — ROSUVASTATIN CALCIUM 10 MG PO TABS: 20.0000 mg | ORAL_TABLET | Freq: Every day | ORAL | Status: DC

## 2019-08-29 NOTE — Discharge Summary (Signed)
Discharge Summary    Patient ID: Molly [name] MRN: [id number]; DOB: 1951-02-05  Admit date: 08/28/2019 Discharge date: 08/29/2019  Primary Care Provider: [name], MD  Primary Cardiologist: Dr. Agustin Cree PV: Dr. Gwenlyn Found   Discharge Diagnoses    Active Problems:   Peripheral vascular disease ([place])   Claudication in peripheral vascular disease [facility])    Diagnostic Studies/Procedures    PERIPHERAL VASCULAR BALLOON ANGIOPLASTY  ABDOMINAL AORTOGRAM W/LOWER EXTREMITY   Procedures Performed:               1.  Ultrasound-guided right common femoral access, abdominal aortogram/bilateral iliac angiogram/bifemoral runoff               2.  Contralateral access (secondary catheter placement)               3.  Failed attempt at crossing mid left SFA CTO               4.  Right common femoral angiogram/successful MYNX closure  PROCEDURE DESCRIPTION:   The patient was brought to the second floor [place] [facility] cath lab in the the postabsorptive state. She was premedicated with IV Versed and fentanyl. Her right groin was prepped and shaved in usual sterile fashion. Xylocaine 1% was used for local anesthesia. A 5 French sheath was inserted into the right common femoral artery using standard Seldinger technique.  Ultrasound was used to identify the right common femoral artery and guide access.  A digital image of this was captured and placed in the patient's chart.  A 5 French pigtail catheter was placed in the distal abdominal aorta.  Distal abdominal aortography, bilateral iliac angiography with bifemoral runoff was performed using bolus chase, digital subtraction and step table technique.  Isovue dye was used for the entirety of the case.  Retrograde aortic pressure was monitored during the case.    Angiographic Data:   1: Abdominal aorta-there is a 90% proximal left renal artery stenosis.  The infrarenal abdominal aorta was moderately atherosclerotic. 2: Left lower  extremity-70% segmental proximal left SFA stenosis followed by a long segment CTO mid left SFA reconstituting in the adductor canal.  The popliteal artery was occluded as were all tibial vessels.  The anterior tibial was patent beyond the proximal occlusion via collaterals. 3: Right lower extremity-mid right SFA CTO reconstituting in the distal right SFA.  The right anterior tibial artery was occluded in its midportion, there was a 90% tibioperoneal trunk stenosis after which the peroneal and.  Posterior tibial are occluded.   IMPRESSION: High-grade multilevel bilateral infrainguinal disease with SFA CTO's and 0 vessel runoff bilaterally.  Patient is a claudicant.  We will attempt left SFA CTO percutaneous recanalization.  Procedure Description: Contralateral access was obtained with a crossover catheter, Glidewire, Rosen wire and 7 French 55 cm multipurpose Ansell sheath.  The patient received a total of 11,000 as of heparin with an ACT of 285.  Total 205 cc of contrast was administered to the patient.  I attempted to cross the CTO with a V onset CTO catheter along with a 0.14 300 cm length 6 g Zilient wire unsuccessfully.  I then changed to an 018 angled CXI endhole catheter along with a 018 12 g Zilient catheter which again was unsuccessful.  Finally, I exchanged for a 035 quick cross with the intent of using an stiff angled glide however after selective injection through the quick cross became obvious that we were extravascular and the procedure was terminated.  The  sheath was withdrawn across the bifurcation and exchanged over an 035 wire for a short 7 [place] sheath.  A right common femoral angiogram was performed and a MYNX closure device was successfully deployed achieving hemostasis.  The patient did receive 10 mg of IV hydralazine for hypertension.  Final Impression: Molly [name] has bilateral SFA CTO's with 0 vessel runoff.  She is a claudicant.  I was unsuccessful full in crossing the left  SFA CTO because I was extravascular.  I am going to keep her overnight for observation and hydration.  Her groin was sealed with a minx closure device successfully.  She will be discharged home in the morning and I will see her back in the office next week at which time we will discussed the possibility of staged right SFA intervention versus continued medical therapy.  Dr. Agustin Cree, the referring cardiologist, was notified of these results.  She left lab in stable condition.  History of present illness:     69 y.o. female with history of hypertension, diabetes mellitus, hyperlipidemia and peripheral vascular disease presents for outpatient PV angiogram.  Patient has symptomatic claudication for past 1 year with life limiting symptoms.  Recent Doppler 07/10/2019 showed high-grade or occluded distal SFA disease bilaterally.  Seen by Dr. Gwenlyn Found and PV angiogram arranged.  Hospital Course     Consultants:None  Patient had unsuccessful left SFA CTO intervention.  Patient was kept overnight and given gentle hydration.  Serum creatinine 1.08-1.49.  She is followed by nephrologist Dr. Johnney Ou at [facility]>> plan was to stop amlodipine and increase losartan to 50 mg daily after 4 days of intervention due to proteinuria.  Given bump in creatinine post angiography>> will give additional 500 cc of bolus.  Patient will hold her losartan and increase amlodipine to 10 mg daily.  Will recheck bmet on Monday.  If stable renal functions he can proceed with planrecommended by her kidney doctor.  She will continue Crestor 20mg  qd. Consider up-titration as outpatient. Seems needs updated Lipid panel as well.   Did the patient have an acute coronary syndrome (MI, NSTEMI, STEMI, etc) this admission?:  No                               Did the patient have a percutaneous coronary intervention (stent / angioplasty)?:  No.     Discharge Vitals Blood pressure 132/80, pulse (!) 56, temperature 98.7 F  (37.1 C), temperature source Oral, resp. rate 17, height 5\' 2"  (1.575 m), weight 73.5 kg, SpO2 97 %.  Filed Weights   08/28/19 0606  Weight: 73.5 kg   Physical Exam  Constitutional: She is oriented to person, place, and time and well-developed, well-nourished, and in no distress.  HENT:  Head: Normocephalic and atraumatic.  Eyes: Pupils are equal, round, and reactive to light. EOM are normal.  Cardiovascular: Normal rate and regular rhythm.  Right groin without hematoma   Pulmonary/Chest: Effort normal and breath sounds normal.  Abdominal: Soft. Bowel sounds are normal.  Musculoskeletal:        General: Normal range of motion.     Cervical back: Normal range of motion and neck supple.  Neurological: She is alert and oriented to person, place, and time.  Skin: Skin is warm and dry.  Psychiatric: Affect normal.   Labs & Radiologic Studies    CBC Recent Labs    08/29/19 0340  WBC 7.2  HGB 11.3*  HCT 36.6  MCV 89.5  PLT 993   Basic Metabolic Panel Recent Labs    08/29/19 0340  NA 140  K 4.3  CL 106  CO2 24  GLUCOSE 155*  BUN 26*  CREATININE 1.49*  CALCIUM 8.1*  _____________  PERIPHERAL VASCULAR CATHETERIZATION  Result Date: 08/28/2019  [id number] LOCATION:  FACILITY: [facility] PHYSICIAN: Quay Burow, M.D. August 11, 1950 DATE OF PROCEDURE:  08/28/2019 DATE OF DISCHARGE: PV Angiogram/Intervention History obtained from chart review.Molly [name] is a 69 y.o. moderately overweight married African-American female with no children referred by Dr. Agustin Cree for evaluation treatment of symptomatic PAD.  She is retired from being a Estate manager/land agent.  She is never smoked cigarettes.  Her risk factors include treated hypertension, diabetes and hyperlipidemia.  Her brother had an MI in his 42s.  She is never had heart attack or stroke.  She denies chest pain or shortness of breath.  She is a claudication which is somewhat symmetric over the last year which is lifestyle limiting.  Recent  Doppler studies performed 07/10/2019 showed high-grade or occluded distal SFA disease bilaterally. Pre Procedure Diagnosis: Peripheral arterial disease Post Procedure Diagnosis: Peripheral arterial disease Operators: Dr. Quay Burow Procedures Performed:  1.  Ultrasound-guided right common femoral access, abdominal aortogram/bilateral iliac angiogram/bifemoral runoff  2.  Contralateral access (secondary catheter placement)  3.  Failed attempt at crossing mid left SFA CTO  4.  Right common femoral angiogram/successful MYNX closure PROCEDURE DESCRIPTION: The patient was brought to the second floor [place] [facility] cath lab in the the postabsorptive state. She was premedicated with IV Versed and fentanyl. Her right groin was prepped and shaved in usual sterile fashion. Xylocaine 1% was used for local anesthesia. A 5 French sheath was inserted into the right common femoral artery using standard Seldinger technique.  Ultrasound was used to identify the right common femoral artery and guide access.  A digital image of this was captured and placed in the patient's chart.  A 5 French pigtail catheter was placed in the distal abdominal aorta.  Distal abdominal aortography, bilateral iliac angiography with bifemoral runoff was performed using bolus chase, digital subtraction and step table technique.  Isovue dye was used for the entirety of the case.  Retrograde aortic pressure was monitored during the case.  Angiographic Data: 1: Abdominal aorta-there is a 90% proximal left renal artery stenosis.  The infrarenal abdominal aorta was moderately atherosclerotic. 2: Left lower extremity-70% segmental proximal left SFA stenosis followed by a long segment CTO mid left SFA reconstituting in the adductor canal.  The popliteal artery was occluded as were all tibial vessels.  The anterior tibial was patent beyond the proximal occlusion via collaterals. 3: Right lower extremity-mid right SFA CTO reconstituting in the distal right  SFA.  The right anterior tibial artery was occluded in its midportion, there was a 90% tibioperoneal trunk stenosis after which the peroneal and.  Posterior tibial are occluded.   High-grade multilevel bilateral infrainguinal disease with SFA CTO's and 0 vessel runoff bilaterally.  Patient is a claudicant.  We will attempt left SFA CTO percutaneous recanalization. Procedure Description: Contralateral access was obtained with a crossover catheter, Glidewire, Rosen wire and 7 French 55 cm multipurpose Ansell sheath.  The patient received a total of 11,000 as of heparin with an ACT of 285.  Total 205 cc of contrast was administered to the patient. I attempted to cross the CTO with a V onset CTO catheter along with a 0.14 300 cm length 6 g Zilient wire unsuccessfully.  I then changed to an 018 angled CXI endhole catheter along with a 018 12 g Zilient catheter which again was unsuccessful.  Finally, I exchanged for a 035 quick cross with the intent of using an stiff angled glide however after selective injection through the quick cross became obvious that we were extravascular and the procedure was terminated. The sheath was withdrawn across the bifurcation and exchanged over an 035 wire for a short 7 [place] sheath.  A right common femoral angiogram was performed and a MYNX closure device was successfully deployed achieving hemostasis.  The patient did receive 10 mg of IV hydralazine for hypertension. Final Impression: Molly [name] has bilateral SFA CTO's with 0 vessel runoff.  She is a claudicant.  I was unsuccessful full in crossing the left SFA CTO because I was extravascular.  I am going to keep her overnight for observation and hydration.  Her groin was sealed with a minx closure device successfully.  She will be discharged home in the morning and I will see her back in the office next week at which time we will discussed the possibility of staged right SFA intervention versus continued medical therapy.  Dr.  Agustin Cree, the referring cardiologist, was notified of these results.  She left lab in stable condition. Quay Burow. MD, [facility] 08/28/2019 9:05 AM   Disposition   Pt is being discharged home today in good condition.  Follow-up Plans & Appointments    Follow-up Information    Lorretta Harp, MD Follow up on 09/05/2019.   Specialties: Cardiology, Radiology Why: 11:30 Contact information: [address] [place] [address] [place]. Go on 09/01/2019.   Specialty: Cardiology Why: between 7:30 - 4 pm for renal funciton check  Contact information: [address] [place] [phone]         Discharge Instructions    Diet - low sodium heart healthy   Complete by: As directed    Discharge instructions   Complete by: As directed    No driving for 48 hours. No lifting over 5 lbs for 1 week. No sexual activity for 1 week. Keep procedure site clean & dry. If you notice increased pain, swelling, bleeding or pus, call/return!  You may shower, but no soaking baths/hot tubs/pools for 1 week.   Increase activity slowly   Complete by: As directed       Discharge Medications   Allergies as of 08/29/2019      Reactions   Mercury Nausea And Vomiting      Medication List    STOP taking these medications   losartan 25 MG tablet Commonly known as: COZAAR     TAKE these medications   amLODipine 10 MG tablet Commonly known as: NORVASC Take 1 tablet (10 mg total) by mouth at bedtime. What changed:   medication strength  how much to take   aspirin EC 81 MG tablet Take 81 mg by mouth at bedtime.   Cholecalciferol 25 MCG (1000 UT) tablet Take 2,000 Units by mouth daily.   Fifty50 Pen Needles 32G X 4 MM Misc Generic drug: Insulin Pen Needle USE AS DIRECTED TWICE DAILY   Flintstones Complete 18 MG Chew Chew 1 tablet by mouth daily.   gabapentin 100 MG capsule Commonly known as:  NEURONTIN Take 100 mg by mouth 3 (three) times daily.   GenTeal 0.25-0.3 % Gel Generic drug: Carboxymethylcell-Hypromellose Place 1 drop into both eyes  daily as needed (Dry eye).   ketoconazole 2 % cream Commonly known as: NIZORAL Apply 1 application topically daily as needed for irritation.   levocetirizine 5 MG tablet Commonly known as: XYZAL Take 5 mg by mouth at bedtime.   loperamide 2 MG tablet Commonly known as: IMODIUM A-D Take 2 mg by mouth 4 (four) times daily as needed for diarrhea or loose stools.   metoprolol succinate 50 MG 24 hr tablet Commonly known as: TOPROL-XL Take 50 mg by mouth at bedtime.   NovoLIN 70/30 FlexPen (70-30) 100 UNIT/ML KwikPen Generic drug: insulin isophane & regular human Inject 20 Units into the skin daily.   rosuvastatin 10 MG tablet Commonly known as: CRESTOR Take 2 tablets (20 mg total) by mouth at bedtime. What changed: See the new instructions.   triamcinolone cream 0.1 % Commonly known as: KENALOG Apply 1 application topically daily as needed (spots on legs).          Outstanding Labs/Studies   BMET on 5/17  Duration of Discharge Encounter   Greater than 30 minutes including physician time.  Jarrett Soho, PA 08/29/2019, 9:05 AM

## 2019-08-29 NOTE — Progress Notes (Signed)
mebt

## 2019-08-29 NOTE — Progress Notes (Signed)
Inpatient Diabetes Program Recommendations  AACE/ADA: New Consensus Statement on Inpatient Glycemic Control (2015)  Target Ranges:  Prepandial:   less than 140 mg/dL      Peak postprandial:   less than 180 mg/dL (1-2 hours)      Critically ill patients:  140 - 180 mg/dL   Lab Results  Component Value Date   GLUCAP 165 (H) 08/29/2019   HGBA1C 10.9 (H) 06/29/2015    Review of Glycemic Control Results for Molly, [name] (MRN [id number]) as of 08/29/2019 11:28  Ref. Range 08/29/2019 02:25 08/29/2019 02:53 08/29/2019 03:16 08/29/2019 03:51 08/29/2019 06:16  Glucose-Capillary Latest Ref Range: 70 - 99 mg/dL 49 (L) 46 (L) 53 (L) 132 (H) 165 (H)   Diabetes history: DM2  Outpatient Diabetes medications: 70/30 20 units daily with largest meal   Current orders for Inpatient glycemic control: 70/30 20 units daily  Note:  Spoke with patient at bedside.  Verified she takes the above home 70/30 20 units daily with her largest meal usually around 2-3pm.  She has IBS and states she only eats 1 meal/day.  She became hypoglycemic overnight.  She ate later than usual and not what she would normally eat.  She does not have hypoglycemic episodes at home but dose know signs and symptoms and how to treat.  She is current with Dr. Buddy Duty for endocrinology.  Reviewed patient's current A1c of 10.9%. Also reviewed goal A1c with patient, importance of good glucose control @ home, and blood sugar goals.  She says her A1C is usually around 6.9%.  The plan is for her to discharge today.  Would recommend decreasing 70/30 to 15 units daily if patient stays inpatient.    Thank you, Molly Dixon, RN, BSN Diabetes Coordinator Inpatient Diabetes Program [phone] (team pager from 8a-5p)

## 2019-09-03 ENCOUNTER — Other Ambulatory Visit: Payer: Self-pay

## 2019-09-03 DIAGNOSIS — N179 Acute kidney failure, unspecified: Secondary | ICD-10-CM

## 2019-09-03 DIAGNOSIS — Z79899 Other long term (current) drug therapy: Secondary | ICD-10-CM

## 2019-09-04 ENCOUNTER — Encounter (HOSPITAL_COMMUNITY): Payer: Medicare Other

## 2019-09-04 LAB — BASIC METABOLIC PANEL
BUN/Creatinine Ratio: 21 (ref 12–28)
BUN: 24 mg/dL (ref 8–27)
CO2: 25 mmol/L (ref 20–29)
Calcium: 8.5 mg/dL — ABNORMAL LOW (ref 8.7–10.3)
Chloride: 105 mmol/L (ref 96–106)
Creatinine, Ser: 1.12 mg/dL — ABNORMAL HIGH (ref 0.57–1.00)
GFR calc Af Amer: 59 mL/min/{1.73_m2} — ABNORMAL LOW (ref >59)
GFR calc non Af Amer: 51 mL/min/{1.73_m2} — ABNORMAL LOW (ref >59)
Glucose: 170 mg/dL — ABNORMAL HIGH (ref 65–99)
Potassium: 5.1 mmol/L (ref 3.5–5.2)
Sodium: 141 mmol/L (ref 134–144)

## 2019-09-05 ENCOUNTER — Inpatient Hospital Stay (HOSPITAL_COMMUNITY): Admission: RE | Admit: 2019-09-05 | Payer: Medicare Other | Source: Ambulatory Visit

## 2019-09-05 ENCOUNTER — Other Ambulatory Visit: Payer: Self-pay

## 2019-09-05 ENCOUNTER — Ambulatory Visit: Payer: Medicare Other | Admitting: Cardiovascular Disease

## 2019-09-05 ENCOUNTER — Encounter: Payer: Self-pay | Admitting: Cardiovascular Disease

## 2019-09-05 DIAGNOSIS — I739 Peripheral vascular disease, unspecified: Secondary | ICD-10-CM

## 2019-09-05 MED ORDER — CILOSTAZOL 50 MG PO TABS: 50.0000 mg | ORAL_TABLET | Freq: Two times a day (BID) | ORAL | 1 refills | Status: DC

## 2019-09-05 NOTE — Patient Instructions (Signed)
Medication Instructions:  Start Pletal 50 mg twice daily   *If you need a refill on your cardiac medications before your next appointment, please call your pharmacy*   Testing/Procedures: Your physician has requested that you have a renal artery duplex. During this test, an ultrasound is used to evaluate blood flow to the kidneys. Allow one hour for this exam. Do not eat after midnight the day before and avoid carbonated beverages. Take your medications as you usually do.   Follow-Up: At [facility], you and your health needs are our priority.  As part of our continuing mission to provide you with exceptional heart care, we have created designated Provider Care Teams.  These Care Teams include your primary Cardiologist (physician) and Advanced Practice Providers (APPs -  Physician Assistants and Nurse Practitioners) who all work together to provide you with the care you need, when you need it.  We recommend signing up for the patient portal called "MyChart".  Sign up information is provided on this After Visit Summary.  MyChart is used to connect with patients for Virtual Visits (Telemedicine).  Patients are able to view lab/test results, encounter notes, upcoming appointments, etc.  Non-urgent messages can be sent to your provider as well.   To learn more about what you can do with MyChart, go to [email or web address].    Your next appointment:   3 month(s)  The format for your next appointment:   In Person  Provider:   Quay Burow, MD

## 2019-09-05 NOTE — Assessment & Plan Note (Signed)
History of PAD status post recent lower extremity angiogram with attempted left SFA intervention for long segment calcified CTO which was unsuccessful.  She has symmetric disease on the right.  I also found 90% left renal artery stenosis.  Blood pressures at home are well controlled.  She wishes to wait before considering staged right SFA intervention.  We will start her on Pletal 50 mg p.o. twice daily in the interim.

## 2019-09-05 NOTE — Progress Notes (Signed)
09/05/2019 Molly [name]   22-Jun-1950  [id number]  Primary Physician [name], MD Primary Cardiologist: Lorretta Harp MD Lupe Carney, [place]  HPI:  Molly [name] is a 69 y.o.  moderately overweight married African-American female with no children referred by Dr. Agustin Cree for evaluation treatment of symptomatic PAD.  She is retired from being a Estate manager/land agent.    I last saw her in the office 08/19/2019.  She is never smoked cigarettes.  Her risk factors include treated hypertension, diabetes and hyperlipidemia.  Her brother had an MI in his 2s.  She is never had heart attack or stroke.  She denies chest pain or shortness of breath.  She is a claudication which is somewhat symmetric over the last year which is lifestyle limiting.  Recent Doppler studies performed 07/10/2019 showed high-grade or occluded distal SFA disease bilaterally.  I performed peripheral angiography on her and failed intervention 08/28/2019.  She had bilateral SFA CTO's with an occluded popliteal and tibial vessels on the left and tibial disease on the right.  I was not successful in opening up her left SFA CTO.  She is discharged home the following day.  I did find a 90% left renal artery stenosis.  Blood pressures at home of been well controlled.    Current Meds  Medication Sig  . amLODipine (NORVASC) 10 MG tablet Take 1 tablet (10 mg total) by mouth at bedtime.  Marland Kitchen aspirin EC 81 MG tablet Take 81 mg by mouth at bedtime.   . Carboxymethylcell-Hypromellose (GENTEAL) 0.25-0.3 % GEL Place 1 drop into both eyes daily as needed (Dry eye).  . Cholecalciferol 25 MCG (1000 UT) tablet Take 2,000 Units by mouth daily.  Marland Kitchen gabapentin (NEURONTIN) 100 MG capsule Take 100 mg by mouth 3 (three) times daily.  . Insulin Isophane & Regular Human (NOVOLIN 70/30 FLEXPEN) (70-30) 100 UNIT/ML PEN Inject 20 Units into the skin daily.   . Insulin Pen Needle (FIFTY50 PEN NEEDLES) 32G X 4 MM MISC USE AS DIRECTED TWICE  DAILY  . ketoconazole (NIZORAL) 2 % cream Apply 1 application topically daily as needed for irritation.   Marland Kitchen levocetirizine (XYZAL) 5 MG tablet Take 5 mg by mouth at bedtime.   Marland Kitchen loperamide (IMODIUM A-D) 2 MG tablet Take 2 mg by mouth 4 (four) times daily as needed for diarrhea or loose stools.  . metoprolol succinate (TOPROL-XL) 50 MG 24 hr tablet Take 50 mg by mouth at bedtime.   . Pediatric Multivitamins-Iron (FLINTSTONES COMPLETE) 18 MG CHEW Chew 1 tablet by mouth daily.   . rosuvastatin (CRESTOR) 10 MG tablet Take 2 tablets (20 mg total) by mouth at bedtime.  . triamcinolone cream (KENALOG) 0.1 % Apply 1 application topically daily as needed (spots on legs).      Allergies  Allergen Reactions  . Mercury Nausea And Vomiting    Social History   Socioeconomic History  . Marital status: Married    Spouse name: Not on file  . Number of children: Not on file  . Years of education: Not on file  . Highest education level: Not on file  Occupational History  . Not on file  Tobacco Use  . Smoking status: Never Smoker  . Smokeless tobacco: Never Used  Substance and Sexual Activity  . Alcohol use: No  . Drug use: Never  . Sexual activity: Not on file  Other Topics Concern  . Not on file  Social History Narrative  . Not on file  Social Determinants of Health   Financial Resource Strain:   . Difficulty of Paying Living Expenses:   Food Insecurity:   . Worried About Charity fundraiser in the Last Year:   . Arboriculturist in the Last Year:   Transportation Needs:   . Film/video editor (Medical):   Marland Kitchen Lack of Transportation (Non-Medical):   Physical Activity:   . Days of Exercise per Week:   . Minutes of Exercise per Session:   Stress:   . Feeling of Stress :   Social Connections:   . Frequency of Communication with Friends and Family:   . Frequency of Social Gatherings with Friends and Family:   . Attends Religious Services:   . Active Member of Clubs or  Organizations:   . Attends Archivist Meetings:   Marland Kitchen Marital Status:   Intimate Partner Violence:   . Fear of Current or Ex-Partner:   . Emotionally Abused:   Marland Kitchen Physically Abused:   . Sexually Abused:      Review of Systems: General: negative for chills, fever, night sweats or weight changes.  Cardiovascular: negative for chest pain, dyspnea on exertion, edema, orthopnea, palpitations, paroxysmal nocturnal dyspnea or shortness of breath Dermatological: negative for rash Respiratory: negative for cough or wheezing Urologic: negative for hematuria Abdominal: negative for nausea, vomiting, diarrhea, bright red blood per rectum, melena, or hematemesis Neurologic: negative for visual changes, syncope, or dizziness All other systems reviewed and are otherwise negative except as noted above.    Blood pressure (!) 218/90, pulse 79, height 5\' 2"  (1.575 m), weight 165 lb 6.4 oz (75 kg), SpO2 95 %.  General appearance: alert and no distress Neck: no adenopathy, no carotid bruit, no JVD, supple, symmetrical, trachea midline and thyroid not enlarged, symmetric, no tenderness/mass/nodules Lungs: clear to auscultation bilaterally Heart: regular rate and rhythm, S1, S2 normal, no murmur, click, rub or gallop Extremities: extremities normal, atraumatic, no cyanosis or edema Pulses: Absent pedal pulses Skin: Moderate ecchymosis record Neurologic: Grossly normal  EKG not performed today  ASSESSMENT AND PLAN:   Claudication in peripheral vascular disease (HCC) History of PAD status post recent lower extremity angiogram with attempted left SFA intervention for long segment calcified CTO which was unsuccessful.  She has symmetric disease on the right.  I also found 90% left renal artery stenosis.  Blood pressures at home are well controlled.  She wishes to wait before considering staged right SFA intervention.  We will start her on Pletal 50 mg p.o. twice daily in the  interim.      Lorretta Harp MD FACP,FACC,FAHA, [facility] 09/05/2019 11:48 AM

## 2019-09-12 ENCOUNTER — Ambulatory Visit: Payer: Medicare Other | Admitting: Cardiovascular Disease

## 2019-09-17 ENCOUNTER — Encounter: Payer: Self-pay | Admitting: Cardiology

## 2019-09-17 ENCOUNTER — Encounter (HOSPITAL_COMMUNITY): Payer: Medicare Other

## 2019-09-24 ENCOUNTER — Ambulatory Visit (HOSPITAL_COMMUNITY)
Admission: RE | Admit: 2019-09-24 | Discharge: 2019-09-24 | Disposition: A | Discharge status: Home / Self Care | Payer: Medicare Other | Source: Ambulatory Visit | Attending: Internal Medicine | Admitting: Internal Medicine

## 2019-09-24 ENCOUNTER — Other Ambulatory Visit: Payer: Self-pay | Admitting: Cardiovascular Disease

## 2019-09-24 ENCOUNTER — Other Ambulatory Visit: Payer: Self-pay

## 2019-09-24 DIAGNOSIS — I10 Essential (primary) hypertension: Secondary | ICD-10-CM

## 2019-10-21 ENCOUNTER — Ambulatory Visit: Payer: Medicare Other | Admitting: Cardiology

## 2019-10-24 ENCOUNTER — Other Ambulatory Visit: Payer: Self-pay

## 2019-10-24 ENCOUNTER — Ambulatory Visit: Payer: Medicare Other | Admitting: Cardiology

## 2019-10-24 ENCOUNTER — Encounter: Payer: Self-pay | Admitting: Cardiology

## 2019-10-24 VITALS — BP 142/80 | HR 66 | Ht 62.0 in | Wt 160.0 lb

## 2019-10-24 DIAGNOSIS — E119 Type 2 diabetes mellitus without complications: Secondary | ICD-10-CM

## 2019-10-24 DIAGNOSIS — I739 Peripheral vascular disease, unspecified: Secondary | ICD-10-CM

## 2019-10-24 DIAGNOSIS — E785 Hyperlipidemia, unspecified: Secondary | ICD-10-CM

## 2019-10-24 DIAGNOSIS — R0609 Other forms of dyspnea: Secondary | ICD-10-CM

## 2019-10-24 DIAGNOSIS — I10 Essential (primary) hypertension: Secondary | ICD-10-CM

## 2019-10-24 DIAGNOSIS — R0600 Dyspnea, unspecified: Secondary | ICD-10-CM

## 2019-10-24 DIAGNOSIS — Z794 Long term (current) use of insulin: Secondary | ICD-10-CM

## 2019-10-24 NOTE — Patient Instructions (Signed)
Medication Instructions:  Your physician recommends that you continue on your current medications as directed. Please refer to the Current Medication list given to you today.  *If you need a refill on your cardiac medications before your next appointment, please call your pharmacy*   Lab Work: Your physician recommends that you return for lab work : lipid  If you have labs (blood work) drawn today and your tests are completely normal, you will receive your results only by: Marland Kitchen MyChart Message (if you have MyChart) OR . A paper copy in the mail If you have any lab test that is abnormal or we need to change your treatment, we will call you to review the results.   Testing/Procedures: Your physician has requested that you have an echocardiogram. Echocardiography is a painless test that uses sound waves to create images of your heart. It provides your doctor with information about the size and shape of your heart and how well your heart's chambers and valves are working. This procedure takes approximately one hour. There are no restrictions for this procedure.     Follow-Up: At [facility], you and your health needs are our priority.  As part of our continuing mission to provide you with exceptional heart care, we have created designated Provider Care Teams.  These Care Teams include your primary Cardiologist (physician) and Advanced Practice Providers (APPs -  Physician Assistants and Nurse Practitioners) who all work together to provide you with the care you need, when you need it.  We recommend signing up for the patient portal called "MyChart".  Sign up information is provided on this After Visit Summary.  MyChart is used to connect with patients for Virtual Visits (Telemedicine).  Patients are able to view lab/test results, encounter notes, upcoming appointments, etc.  Non-urgent messages can be sent to your provider as well.   To learn more about what you can do with MyChart, go to  [email or web address].    Your next appointment:   6 week(s)  The format for your next appointment:   In Person  Provider:   Jenne Campus, MD   Other Instructions   Echocardiogram An echocardiogram is a procedure that uses painless sound waves (ultrasound) to produce an image of the heart. Images from an echocardiogram can provide important information about:  Signs of coronary artery disease (CAD).  Aneurysm detection. An aneurysm is a weak or damaged part of an artery wall that bulges out from the normal force of blood pumping through the body.  Heart size and shape. Changes in the size or shape of the heart can be associated with certain conditions, including heart failure, aneurysm, and CAD.  Heart muscle function.  Heart valve function.  Signs of a past heart attack.  Fluid buildup around the heart.  Thickening of the heart muscle.  A tumor or infectious growth around the heart valves. Tell a health care provider about:  Any allergies you have.  All medicines you are taking, including vitamins, herbs, eye drops, creams, and over-the-counter medicines.  Any blood disorders you have.  Any surgeries you have had.  Any medical conditions you have.  Whether you are pregnant or may be pregnant. What are the risks? Generally, this is a safe procedure. However, problems may occur, including:  Allergic reaction to dye (contrast) that may be used during the procedure. What happens before the procedure? No specific preparation is needed. You may eat and drink normally. What happens during the procedure?   An IV tube may  be inserted into one of your veins.  You may receive contrast through this tube. A contrast is an injection that improves the quality of the pictures from your heart.  A gel will be applied to your chest.  A wand-like tool (transducer) will be moved over your chest. The gel will help to transmit the sound waves from the transducer.  The  sound waves will harmlessly bounce off of your heart to allow the heart images to be captured in real-time motion. The images will be recorded on a computer. The procedure may vary among health care providers and hospitals. What happens after the procedure?  You may return to your normal, everyday life, including diet, activities, and medicines, unless your health care provider tells you not to do that. Summary  An echocardiogram is a procedure that uses painless sound waves (ultrasound) to produce an image of the heart.  Images from an echocardiogram can provide important information about the size and shape of your heart, heart muscle function, heart valve function, and fluid buildup around your heart.  You do not need to do anything to prepare before this procedure. You may eat and drink normally.  After the echocardiogram is completed, you may return to your normal, everyday life, unless your health care provider tells you not to do that. This information is not intended to replace advice given to you by your health care provider. Make sure you discuss any questions you have with your health care provider. Document Revised: 07/25/2018 Document Reviewed: 05/06/2016 Elsevier Patient Education  [place].

## 2019-10-24 NOTE — Progress Notes (Signed)
Cardiology Office Note:    Date:  10/24/2019   ID:  Molly [name], DOB Aug 01, 1950, MRN [id number]  PCP:  [name], MD  Cardiologist:  Jenne Campus, MD    Referring MD: [name],*   No chief complaint on file. I am disappointed that my procedure was not successful  History of Present Illness:    Molly [name] is a 69 y.o. female with peripheral vascular disease, bilateral SFA occlusion, status post recent attempt to open up left SFA which was unsuccessful.  She also got history of diabetes hypertension dyslipidemia.  Comes today 2 months of follow-up.  Overall seems to be doing well initially she tells me she has absolutely no limitations she goes to store but does not try to go much.  But then she tells me that because of her claudication she is not enjoying her life as much as she wants to.  And she wants something else to be done about her legs.  Denies have any cardiac complaints.  No chest pain tightness squeezing pressure burning chest.  Past Medical History:  Diagnosis Date  . Abdominal aortic atherosclerosis with stenosis 06/29/2015  . AKI (acute kidney injury) ([place]) 06/29/2015  . Cholecystitis 06/29/2015  . Cholelithiasis 06/29/2015  . Dehydration with hyponatremia 06/29/2015  . Diabetes mellitus ([place]) 07/15/2018  . Diabetes mellitus without complication ([place])   . DKA (diabetic ketoacidoses) ([place]) 06/29/2015  . Dyslipidemia 08/30/2018  . HTN (hypertension) 06/29/2015  . Hypertension   . 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 Leukocytosis 06/29/2015  . Osteopenia 10/02/2018  . Other dysphagia 09/10/2018  . 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  Noncritical  . 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 Proximal leg weakness 09/10/2018  . Ptosis of left eyelid 09/10/2018    Past Surgical History:  Procedure Laterality Date  . ABDOMINAL AORTOGRAM W/LOWER EXTREMITY  N/A 08/28/2019   Procedure: ABDOMINAL AORTOGRAM W/LOWER EXTREMITY;  Surgeon: Lorretta Harp, MD;  Location: [facility];  Service: Cardiovascular;  Laterality: N/A;  . CATARACT EXTRACTION, BILATERAL    . CHOLECYSTECTOMY N/A 06/30/2015   Procedure: LAPAROSCOPIC CHOLECYSTECTOMY WITH INTRAOPERATIVE CHOLANGIOGRAM;  Surgeon: Donnie Mesa, MD;  Location: [facility];  Service: General;  Laterality: N/A;  . PERIPHERAL VASCULAR BALLOON ANGIOPLASTY Right 08/28/2019   Procedure: PERIPHERAL VASCULAR BALLOON ANGIOPLASTY;  Surgeon: Lorretta Harp, MD;  Location: [place] [facility];  Service: Cardiovascular;  Laterality: Right;  attempted SFA    Current Medications: Current Meds  Medication Sig  . amLODipine (NORVASC) 10 MG tablet Take 1 tablet (10 mg total) by mouth at bedtime.  Marland Kitchen aspirin EC 81 MG tablet Take 81 mg by mouth at bedtime.   . Cholecalciferol 25 MCG (1000 UT) tablet Take 2,000 Units by mouth daily.  . cilostazol (PLETAL) 50 MG tablet Take 1 tablet (50 mg total) by mouth 2 (two) times daily.  Marland Kitchen gabapentin (NEURONTIN) 100 MG capsule Take 100 mg by mouth 3 (three) times daily.  . Insulin Isophane & Regular Human (NOVOLIN 70/30 FLEXPEN) (70-30) 100 UNIT/ML PEN Inject 20 Units into the skin daily.   . Insulin Pen Needle (FIFTY50 PEN NEEDLES) 32G X 4 MM MISC USE AS DIRECTED TWICE DAILY  . ketoconazole (NIZORAL) 2 % cream Apply 1 application topically daily as needed for irritation.   Marland Kitchen levocetirizine (XYZAL) 5 MG tablet Take 5 mg by mouth at bedtime.   Marland Kitchen loperamide (IMODIUM A-D) 2  MG tablet Take 2 mg by mouth 4 (four) times daily as needed for diarrhea or loose stools.  . metoprolol succinate (TOPROL-XL) 50 MG 24 hr tablet Take 50 mg by mouth at bedtime.   . Pediatric Multivitamins-Iron (FLINTSTONES COMPLETE) 18 MG CHEW Chew 1 tablet by mouth daily.   . rosuvastatin (CRESTOR) 10 MG tablet Take 2 tablets (20 mg total) by mouth at bedtime.  . triamcinolone cream (KENALOG) 0.1 % Apply 1  application topically daily as needed (spots on legs).      Allergies:   Mercury   Social History   Socioeconomic History  . Marital status: Married    Spouse name: Not on file  . Number of children: Not on file  . Years of education: Not on file  . Highest education level: Not on file  Occupational History  . Not on file  Tobacco Use  . Smoking status: Never Smoker  . Smokeless tobacco: Never Used  Vaping Use  . Vaping Use: Never used  Substance and Sexual Activity  . Alcohol use: No  . Drug use: Never  . Sexual activity: Not on file  Other Topics Concern  . Not on file  Social History Narrative  . Not on file   Social Determinants of Health   Financial Resource Strain:   . Difficulty of Paying Living Expenses:   Food Insecurity:   . Worried About Charity fundraiser in the Last Year:   . Arboriculturist in the Last Year:   Transportation Needs:   . Film/video editor (Medical):   Marland Kitchen Lack of Transportation (Non-Medical):   Physical Activity:   . Days of Exercise per Week:   . Minutes of Exercise per Session:   Stress:   . Feeling of Stress :   Social Connections:   . Frequency of Communication with Friends and Family:   . Frequency of Social Gatherings with Friends and Family:   . Attends Religious Services:   . Active Member of Clubs or Organizations:   . Attends Archivist Meetings:   Marland Kitchen Marital Status:      Family History: The patient's family history includes Breast cancer in her mother; Hypertension in her father and mother. ROS:   Please see the history of present illness.    All 14 point review of systems negative except as described per history of present illness  EKGs/Labs/Other Studies Reviewed:      Recent Labs: 07/18/2019: TSH 3.980 08/29/2019: Hemoglobin 11.3; Platelets 240 09/03/2019: BUN 24; Creatinine, Ser 1.12; Potassium 5.1; Sodium 141  Recent Lipid Panel    Component Value Date/Time   CHOL 152 04/12/2018 1153   TRIG  143 04/12/2018 1153   HDL 46 04/12/2018 1153   CHOLHDL 3.3 04/12/2018 1153   LDLCALC 77 04/12/2018 1153    Physical Exam:    VS:  BP (!) 142/80 (BP Location: Right Arm, Patient Position: Sitting, Cuff Size: Normal)   Pulse 66   Ht 5\' 2"  (1.575 m)   Wt 160 lb (72.6 kg)   SpO2 90%   BMI 29.26 kg/m     Wt Readings from Last 3 Encounters:  10/24/19 160 lb (72.6 kg)  09/05/19 165 lb 6.4 oz (75 kg)  08/28/19 162 lb (73.5 kg)     GEN:  Well nourished, well developed in no acute distress HEENT: Normal NECK: No JVD; No carotid bruits LYMPHATICS: No lymphadenopathy CARDIAC: RRR, no murmurs, no rubs, no gallops RESPIRATORY:  Clear to auscultation  without rales, wheezing or rhonchi  ABDOMEN: Soft, non-tender, non-distended MUSCULOSKELETAL:  No edema; No deformity  SKIN: Warm and dry LOWER EXTREMITIES: no swelling NEUROLOGIC:  Alert and oriented x 3 PSYCHIATRIC:  Normal affect   ASSESSMENT:    1. Dyspnea on exertion   2. Dyslipidemia   3. Peripheral vascular disease ([place])   4. Essential hypertension   5. Type 2 diabetes mellitus without complication, with long-term current use of insulin (HCC)   6. Claudication in peripheral vascular disease ([place])    PLAN:    In order of problems listed above:  1. Dyspnea on exertion.  Denies having any but at the same time because of claudication she cannot do much. 2. Dyslipidemia: I will schedule her to have fasting lipid profile done. 3. Peripheral vascular disease with total occlusion of both SFA, status post unsuccessful opening of left SFA.  We talked in all 19 options about the situation option being continue medical therapy that she is on right now which include Pletal, exercises, attempt to reopen right SFA, potentially doing bypass surgery of her lower extremities.  She said she took some time to think about it however it looks like she is leaning more towards doing surgery.  Again she will let me know. 4. Diabetes mellitus:  Apparently stable. 5. Essential hypertension blood pressure seems to be controlled.   Medication Adjustments/Labs and Tests Ordered: Current medicines are reviewed at length with the patient today.  Concerns regarding medicines are outlined above.  Orders Placed This Encounter  Procedures  . Lipid panel  . ECHOCARDIOGRAM COMPLETE   Medication changes: No orders of the defined types were placed in this encounter.   Signed, Park Liter, MD, [facility] 10/24/2019 2:01 PM    [facility]

## 2019-10-25 LAB — LIPID PANEL
Chol/HDL Ratio: 3 ratio (ref 0.0–4.4)
Cholesterol, Total: 169 mg/dL (ref 100–199)
HDL: 56 mg/dL (ref >39)
LDL Chol Calc (NIH): 94 mg/dL (ref 0–99)
Triglycerides: 108 mg/dL (ref 0–149)
VLDL Cholesterol Cal: 19 mg/dL (ref 5–40)

## 2019-10-28 ENCOUNTER — Other Ambulatory Visit: Payer: Self-pay

## 2019-10-28 ENCOUNTER — Encounter (INDEPENDENT_AMBULATORY_CARE_PROVIDER_SITE_OTHER): Payer: Medicare Other | Admitting: Ophthalmology

## 2019-10-28 DIAGNOSIS — H43813 Vitreous degeneration, bilateral: Secondary | ICD-10-CM

## 2019-10-28 DIAGNOSIS — E113593 Type 2 diabetes mellitus with proliferative diabetic retinopathy without macular edema, bilateral: Secondary | ICD-10-CM | POA: Diagnosis not present

## 2019-10-28 DIAGNOSIS — H35033 Hypertensive retinopathy, bilateral: Secondary | ICD-10-CM | POA: Diagnosis not present

## 2019-10-28 DIAGNOSIS — E11319 Type 2 diabetes mellitus with unspecified diabetic retinopathy without macular edema: Secondary | ICD-10-CM | POA: Diagnosis not present

## 2019-10-28 DIAGNOSIS — I10 Essential (primary) hypertension: Secondary | ICD-10-CM | POA: Diagnosis not present

## 2019-10-30 ENCOUNTER — Telehealth: Payer: Self-pay | Admitting: Emergency Medicine

## 2019-10-30 ENCOUNTER — Other Ambulatory Visit: Payer: Self-pay | Admitting: Cardiology

## 2019-10-30 DIAGNOSIS — E785 Hyperlipidemia, unspecified: Secondary | ICD-10-CM

## 2019-10-30 MED ORDER — ROSUVASTATIN CALCIUM 40 MG PO TABS: 40.0000 mg | ORAL_TABLET | Freq: Every day | ORAL | 1 refills | Status: DC

## 2019-10-30 NOTE — Telephone Encounter (Signed)
Follow up  Pt c/o medication issue:  1. Name of Medication:  rosuvastatin (CRESTOR) 40 MG tablet    2. How are you currently taking this medication (dosage and times per day)?   3. Are you having a reaction (difficulty breathing--STAT)?   4. What is your medication issue?   Pt called, she in [facility] pharmacy trying to pick up new prescription for crestor, however, pharmacy said they havent receive the prescription.

## 2019-10-30 NOTE — Addendum Note (Signed)
Addended by: Ashok Norris on: 10/30/2019 12:39 PM   Modules accepted: Orders

## 2019-10-30 NOTE — Telephone Encounter (Signed)
Called patient informed her of results and to increase crestor to 40 mg daily and have labs redrawn in 6 weeks. Patient verbally understood. No further questions.

## 2019-10-30 NOTE — Telephone Encounter (Signed)
Left message to inform patient RX has been resent.

## 2019-11-05 ENCOUNTER — Other Ambulatory Visit: Payer: Self-pay

## 2019-11-05 ENCOUNTER — Ambulatory Visit (INDEPENDENT_AMBULATORY_CARE_PROVIDER_SITE_OTHER): Payer: Medicare Other | Admitting: Podiatry

## 2019-11-05 ENCOUNTER — Encounter: Payer: Self-pay | Admitting: Podiatry

## 2019-11-05 ENCOUNTER — Ambulatory Visit (INDEPENDENT_AMBULATORY_CARE_PROVIDER_SITE_OTHER): Payer: Medicare Other

## 2019-11-05 DIAGNOSIS — B351 Tinea unguium: Secondary | ICD-10-CM

## 2019-11-05 DIAGNOSIS — M79675 Pain in left toe(s): Secondary | ICD-10-CM

## 2019-11-05 DIAGNOSIS — M7671 Peroneal tendinitis, right leg: Secondary | ICD-10-CM | POA: Diagnosis not present

## 2019-11-05 DIAGNOSIS — E1151 Type 2 diabetes mellitus with diabetic peripheral angiopathy without gangrene: Secondary | ICD-10-CM

## 2019-11-05 DIAGNOSIS — M79674 Pain in right toe(s): Secondary | ICD-10-CM | POA: Diagnosis not present

## 2019-11-05 DIAGNOSIS — M778 Other enthesopathies, not elsewhere classified: Secondary | ICD-10-CM

## 2019-11-05 DIAGNOSIS — M7750 Other enthesopathy of unspecified foot: Secondary | ICD-10-CM

## 2019-11-05 MED ORDER — MELOXICAM 7.5 MG PO TABS: 7.5000 mg | ORAL_TABLET | Freq: Every day | ORAL | 0 refills | Status: DC

## 2019-11-05 NOTE — Patient Instructions (Signed)
Peroneal Tendinopathy  Peroneal tendinopathy is irritation of the tendons that pass behind your ankle (peroneal tendons). These tendons attach muscles in your foot to a bone on the side of your foot and underneath the arch of your foot. This condition can cause your peroneal tendons to get bigger and swell. What are the causes? This condition may be caused by:  Putting stress on your ankle over and over again (overuse injury).  A sudden injury that puts stress on your tendons, such as an ankle sprain. What increases the risk? You are more likely to develop this condition if you:  Have high arches.  Play sports that involve putting stress on the ankle over and over again. These sports include: ? Running. ? Dancing. ? Soccer. ? Basketball. What are the signs or symptoms? Symptoms of this condition can start suddenly or develop gradually. Symptoms of this condition include:  Pain in the back of the ankle, on the side of the foot, or in the arch of the foot.  Pain that gets worse with activity and better with rest.  Swelling.  Warmth.  Weakness in your foot or ankle. How is this diagnosed? This condition may be diagnosed based on:  Your symptoms.  Your medical history.  A physical exam. During the exam, your health care provider may move your foot and ankle and test the strength of your leg muscles.  Imaging tests, such as: ? X-rays or a CT scan to check for bone injury. ? MRI or ultrasound to check for muscle or tendon injury. How is this treated? This condition may be treated by:  Keeping your body weight off your ankle for several days.  Returning to full activity gradually.  Putting ice on your ankle to reduce swelling.  Taking NSAIDs, such as ibuprofen.  Having medicine injected into your tendon to reduce swelling.  Wearing a removable boot or brace for ankle support.  Doing range-of-motion exercises and strengthening exercises (physical therapy) when pain  and swelling improve. If the condition does not improve with treatment, or if a tendon or muscle is damaged, surgery may be needed. Follow these instructions at home: If you have a boot or brace:  Wear the boot or brace as told by your health care provider. Remove it only as told by your health care provider.  Loosen the boot or brace if your toes tingle, become numb, or turn cold and blue.  Keep the boot or brace clean.  If the boot or brace is not waterproof: ? Do not let it get wet. ? Cover it with a watertight covering when you take a bath or shower. Managing pain, stiffness, and swelling   If directed, put ice on the injured area. ? If you have a removable boot or brace, remove it as told by your health care provider. ? Put ice in a plastic bag. ? Place a towel between your skin and the bag. ? Leave the ice on for 20 minutes, 2-3 times a day.  Move your toes often to reduce stiffness and swelling.  Raise (elevate) your ankle above the level of your heart while you are sitting or lying down. Activity  Do not do activities that make pain or swelling worse.  Do exercises as told by your health care provider.  Return to your normal activities as told by your health care provider. Ask your health care provider what activities are safe for you.  Ask your health care provider when it is safe to drive  if you have a boot or brace on your foot. General instructions  Take over-the-counter and prescription medicines only as told by your health care provider.  Do not use any products that contain nicotine or tobacco, such as cigarettes, e-cigarettes, and chewing tobacco. These can delay healing. If you need help quitting, ask your health care provider.  Keep all follow-up visits as told by your health care provider. This is important. How is this prevented?  Wear supportive footwear that is appropriate for your athletic activity.  Avoid athletic activities that cause swelling or  pain in your ankle or foot.  See your health care provider if you have pain or swelling that does not improve after a few days of rest.  Stop training if you develop pain or swelling.  If you start a new athletic activity, start gradually to build up your strength, endurance, and flexibility.  Warm up and stretch before being active.  Cool down and stretch after being active. Contact a health care provider if:  Your symptoms get worse.  Your symptoms do not improve in 2-4 weeks.  You develop new, unexplained symptoms. Summary  Peroneal tendinopathy is irritation of the tendons that pass behind your ankle.  This condition is caused by overuse or sudden injury to the peroneal tendon.  Symptoms include pain, swelling, warmth, and weakness in your foot or ankle.  This condition is treated with rest, ice, medicines, physical therapy, and surgery if needed. This information is not intended to replace advice given to you by your health care provider. Make sure you discuss any questions you have with your health care provider. Document Revised: 07/25/2018 Document Reviewed: 05/13/2018 Elsevier Patient Education  [place].

## 2019-11-09 NOTE — Progress Notes (Signed)
Subjective:  Patient ID: Molly [name], female    DOB: March 26, 1951,  MRN: [id number]  Molly [name] presents to clinic today for preventative diabetic foot care, at risk foot care. Pt has h/o NIDDM with PAD and painful thick toenails that are difficult to trim. Pain interferes with ambulation. Aggravating factors include wearing enclosed shoe gear. Pain is relieved with periodic professional debridement..  69 y.o. female presents with the above complaint.  She is happy to report the biopsy on her left 2nd digit was benign. She has brought in a biopsy report from her Dermatologist.  Today, she relates tenderness to right foot for the past two weeks. She points to right 5th metatarsal base. She denies any preceding traumatic incidents.   Review of Systems: Negative except as noted in the HPI. Past Medical History:  Diagnosis Date   Abdominal aortic atherosclerosis with stenosis 06/29/2015   AKI (acute kidney injury) ([place]) 06/29/2015   Cholecystitis 06/29/2015   Cholelithiasis 06/29/2015   Dehydration with hyponatremia 06/29/2015   Diabetes mellitus ([place]) 07/15/2018   Diabetes mellitus without complication ([place])    DKA (diabetic ketoacidoses) ([place]) 06/29/2015   Dyslipidemia 08/30/2018   HTN (hypertension) 06/29/2015   Hypertension    Irritable bowel syndrome with diarrhea 10/02/2018   Leukocytosis 06/29/2015   Osteopenia 10/02/2018   Other dysphagia 09/10/2018   Peripheral vascular disease ([place]) 07/15/2018   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   Proximal leg weakness 09/10/2018   Ptosis of left eyelid 09/10/2018   Past Surgical History:  Procedure Laterality Date   ABDOMINAL AORTOGRAM W/LOWER EXTREMITY N/A 08/28/2019   Procedure: ABDOMINAL AORTOGRAM W/LOWER EXTREMITY;  Surgeon: Lorretta Harp, MD;  Location: [facility];  Service: Cardiovascular;  Laterality: N/A;   CATARACT  EXTRACTION, BILATERAL     CHOLECYSTECTOMY N/A 06/30/2015   Procedure: LAPAROSCOPIC CHOLECYSTECTOMY WITH INTRAOPERATIVE CHOLANGIOGRAM;  Surgeon: Donnie Mesa, MD;  Location: [facility];  Service: General;  Laterality: N/A;   PERIPHERAL VASCULAR BALLOON ANGIOPLASTY Right 08/28/2019   Procedure: PERIPHERAL VASCULAR BALLOON ANGIOPLASTY;  Surgeon: Lorretta Harp, MD;  Location: [facility];  Service: Cardiovascular;  Laterality: Right;  attempted SFA    Current Outpatient Medications:    amLODipine (NORVASC) 10 MG tablet, Take 1 tablet (10 mg total) by mouth at bedtime., Disp:  , Rfl:    aspirin EC 81 MG tablet, Take 81 mg by mouth at bedtime. , Disp: , Rfl:    Cholecalciferol 25 MCG (1000 UT) tablet, Take 2,000 Units by mouth daily., Disp: , Rfl:    cilostazol (PLETAL) 50 MG tablet, Take 1 tablet (50 mg total) by mouth 2 (two) times daily., Disp: 180 tablet, Rfl: 1   gabapentin (NEURONTIN) 100 MG capsule, Take 100 mg by mouth 3 (three) times daily., Disp: , Rfl:    Insulin Isophane & Regular Human (NOVOLIN 70/30 FLEXPEN) (70-30) 100 UNIT/ML PEN, Inject 20 Units into the skin daily. , Disp: , Rfl:    Insulin Pen Needle (FIFTY50 PEN NEEDLES) 32G X 4 MM MISC, USE AS DIRECTED TWICE DAILY, Disp: , Rfl:    ketoconazole (NIZORAL) 2 % cream, Apply 1 application topically daily as needed for irritation. , Disp: , Rfl:    levocetirizine (XYZAL) 5 MG tablet, Take 5 mg by mouth at bedtime. , Disp: , Rfl:    loperamide (IMODIUM A-D) 2 MG tablet, Take 2 mg by mouth 4 (four) times daily as  needed for diarrhea or loose stools., Disp: , Rfl:    metoprolol succinate (TOPROL-XL) 50 MG 24 hr tablet, Take 50 mg by mouth at bedtime. , Disp: , Rfl: 0   Pediatric Multivitamins-Iron (FLINTSTONES COMPLETE) 18 MG CHEW, Chew 1 tablet by mouth daily. , Disp: , Rfl:    rosuvastatin (CRESTOR) 40 MG tablet, Take 1 tablet (40 mg total) by mouth at bedtime., Disp: 90 tablet, Rfl: 1   triamcinolone cream (KENALOG)  0.1 %, Apply 1 application topically daily as needed (spots on legs). , Disp: , Rfl:  Allergies  Allergen Reactions   Mercury Nausea And Vomiting   Social History   Occupational History   Not on file  Tobacco Use   Smoking status: Never Smoker   Smokeless tobacco: Never Used  Vaping Use   Vaping Use: Never used  Substance and Sexual Activity   Alcohol use: No   Drug use: Never   Sexual activity: Not on file    Objective:   Constitutional Molly [name] is a pleasant 69 y.o. African American female, in NAD.Marland Kitchen AAO x 3.   Vascular \\No  cyanosis or clubbing noted. Pedal hair growth diminished b/l. Capillary fill time to digits <3 seconds b/l lower extremities. Nonpalpable DP pulse(s) b/l lower extremities. Nonpalpable PT pulse(s) b/l lower extremities. Pedal hair absent. Lower extremity skin temperature gradient within normal limits. No pain with calf compression b/l. No edema noted b/l lower extremities. No ischemia or gangrene noted b/l LE.  Neurologic Normal speech. Oriented to person, place, and time. Protective sensation intact 5/5 intact bilaterally with 10g monofilament b/l. Vibratory sensation intact b/l.  Dermatologic  Pedal skin with normal turgor, texture and tone bilaterally. No open wounds bilaterally. No interdigital macerations bilaterally. Toenails 1-5 b/l elongated, discolored, dystrophic, thickened, crumbly with subungual debris and tenderness to dorsal palpation.  Orthopedic: Normal muscle strength 5/5 to all lower extremity muscle groups bilaterally. No pain crepitus or joint limitation noted with ROM b/l. Patient ambulates independent of any assistive aids. No bony tenderness.    Radiographs: Right foot, 3 views. No evidence of fracture. There is normal bone mineralization.  No bone erosion noted. Assessment:   1. Tendonitis of ankle or foot    Plan:  Patient was evaluated and treated and all questions answered.  Onychomycosis with pain -Nails  palliatively debridement as below -Educated on self-care  Procedure: Nail Debridement Rationale: Pain Type of Debridement: manual, sharp debridement. Instrumentation: Nail nipper, rotary burr. Number of Nails: 10 -Examined patient. -Continue diabetic foot care principles. -Toenails 1-5 b/l were debrided in length and girth with sterile nail nippers and dremel without iatrogenic bleeding.  -Xrays: 3 views, weightbearing reveals no fractures/dislocations. -Discuss RICE therapy. She is to ice 5 minutes/ 3 times/day. Recommended arch support to prevent pronatory forces of eversion. Offered Power Steps, but she would like to hold off on this today. She will call if she continues to have problems. -She states she is unable to take NSAIDs due to kidney issues. A prescription for Mobic was cancelled and confirmed with Abby at [facility] via telephone. She states she will take Tylenol for the pain. -Reviewed letter from [facility] indicating left 2nd toe biopsy was benign. Will have it scanned into her chart. -Patient to report any pedal injuries to medical professional immediately. -Patient to continue soft, supportive shoe gear daily. -Patient/POA to call should there be question/concern in the interim.  Return in about 3 months (around 02/05/2020) for diabetic nail trim.  Stephani Police  Elisha Ponder, DPM

## 2019-11-11 ENCOUNTER — Other Ambulatory Visit: Payer: Self-pay

## 2019-11-11 ENCOUNTER — Ambulatory Visit (HOSPITAL_COMMUNITY): Payer: Medicare Other | Attending: Cardiology

## 2019-11-11 DIAGNOSIS — R0609 Other forms of dyspnea: Secondary | ICD-10-CM

## 2019-11-11 DIAGNOSIS — R0600 Dyspnea, unspecified: Secondary | ICD-10-CM

## 2019-11-11 LAB — ECHOCARDIOGRAM COMPLETE
AR max vel: 2.33 cm2
AV Area VTI: 2.22 cm2
AV Area mean vel: 2.01 cm2
AV Mean grad: 3 mmHg
AV Peak grad: 4.4 mmHg
Ao pk vel: 1.05 m/s
Area-P 1/2: 3.91 cm2
S' Lateral: 2.8 cm

## 2019-12-09 ENCOUNTER — Ambulatory Visit: Payer: Medicare Other | Admitting: Cardiovascular Disease

## 2019-12-10 ENCOUNTER — Ambulatory Visit: Payer: Medicare Other | Admitting: Cardiology

## 2019-12-10 ENCOUNTER — Encounter: Payer: Self-pay | Admitting: Cardiology

## 2019-12-10 ENCOUNTER — Other Ambulatory Visit: Payer: Self-pay

## 2019-12-10 VITALS — BP 132/60 | HR 67 | Ht 62.0 in | Wt 160.4 lb

## 2019-12-10 DIAGNOSIS — I739 Peripheral vascular disease, unspecified: Secondary | ICD-10-CM

## 2019-12-10 DIAGNOSIS — E119 Type 2 diabetes mellitus without complications: Secondary | ICD-10-CM

## 2019-12-10 DIAGNOSIS — E785 Hyperlipidemia, unspecified: Secondary | ICD-10-CM

## 2019-12-10 DIAGNOSIS — I10 Essential (primary) hypertension: Secondary | ICD-10-CM | POA: Diagnosis not present

## 2019-12-10 DIAGNOSIS — Z794 Long term (current) use of insulin: Secondary | ICD-10-CM

## 2019-12-10 NOTE — Addendum Note (Signed)
Addended by: Ashok Norris on: 12/10/2019 11:32 AM   Modules accepted: Orders

## 2019-12-10 NOTE — Patient Instructions (Signed)
Medication Instructions:  Your physician recommends that you continue on your current medications as directed. Please refer to the Current Medication list given to you today.  *If you need a refill on your cardiac medications before your next appointment, please call your pharmacy*   Lab Work: Your physician recommends that you return for lab work today: lipid  If you have labs (blood work) drawn today and your tests are completely normal, you will receive your results only by: . MyChart Message (if you have MyChart) OR . A paper copy in the mail If you have any lab test that is abnormal or we need to change your treatment, we will call you to review the results.   Testing/Procedures: None   Follow-Up: At [facility], you and your health needs are our priority.  As part of our continuing mission to provide you with exceptional heart care, we have created designated Provider Care Teams.  These Care Teams include your primary Cardiologist (physician) and Advanced Practice Providers (APPs -  Physician Assistants and Nurse Practitioners) who all work together to provide you with the care you need, when you need it.  We recommend signing up for the patient portal called "MyChart".  Sign up information is provided on this After Visit Summary.  MyChart is used to connect with patients for Virtual Visits (Telemedicine).  Patients are able to view lab/test results, encounter notes, upcoming appointments, etc.  Non-urgent messages can be sent to your provider as well.   To learn more about what you can do with MyChart, go to [email or web address].    Your next appointment:   5 month(s)  The format for your next appointment:   In Person  Provider:   Robert Krasowski, MD   Other Instructions    

## 2019-12-10 NOTE — Progress Notes (Signed)
Cardiology Office Note:    Date:  12/10/2019   ID:  Cleda, Imel 1951-04-11, MRN [id number]  PCP:  [name], MD  Cardiologist:  Jenne Campus, MD    Referring MD: [name],*   No chief complaint on file. I'm doing much better  History of Present Illness:    Molly [name] is a 69 y.o. female with past medical history significant for peripheral vascular disease. She does have bilateral SFA occlusion. She did have attempt to open left SFA however it was unsuccessful. Her past medical history is also significant for diabetes hypertension dyslipidemia. She comes today 2 months for follow-up. Last time I seen her I put her on Pletal. She feels significantly better. She is able to walk more and do more. She is able to go to the store without problems. She also can stand and wash her dishes. This is something she wasn't able to do before. Denies have any cardiac complaints. No chest pain tightness squeezing pressure burning chest no dizziness no passing out.  Past Medical History:  Diagnosis Date  . Abdominal aortic atherosclerosis with stenosis 06/29/2015  . AKI (acute kidney injury) ([place]) 06/29/2015  . Cholecystitis 06/29/2015  . Cholelithiasis 06/29/2015  . Dehydration with hyponatremia 06/29/2015  . Diabetes mellitus ([place]) 07/15/2018  . Diabetes mellitus without complication ([place])   . DKA (diabetic ketoacidoses) ([place]) 06/29/2015  . Dyslipidemia 08/30/2018  . HTN (hypertension) 06/29/2015  . Hypertension   . 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 Leukocytosis 06/29/2015  . Osteopenia 10/02/2018  . Other dysphagia 09/10/2018  . 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  Noncritical  . 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 Proximal leg weakness 09/10/2018  . Ptosis of left eyelid 09/10/2018    Past Surgical History:  Procedure Laterality Date  . ABDOMINAL AORTOGRAM  W/LOWER EXTREMITY N/A 08/28/2019   Procedure: ABDOMINAL AORTOGRAM W/LOWER EXTREMITY;  Surgeon: Lorretta Harp, MD;  Location: [facility];  Service: Cardiovascular;  Laterality: N/A;  . CATARACT EXTRACTION, BILATERAL    . CHOLECYSTECTOMY N/A 06/30/2015   Procedure: LAPAROSCOPIC CHOLECYSTECTOMY WITH INTRAOPERATIVE CHOLANGIOGRAM;  Surgeon: Donnie Mesa, MD;  Location: [facility];  Service: General;  Laterality: N/A;  . PERIPHERAL VASCULAR BALLOON ANGIOPLASTY Right 08/28/2019   Procedure: PERIPHERAL VASCULAR BALLOON ANGIOPLASTY;  Surgeon: Lorretta Harp, MD;  Location: [facility];  Service: Cardiovascular;  Laterality: Right;  attempted SFA    Current Medications: Current Meds  Medication Sig  . amLODipine (NORVASC) 10 MG tablet Take 1 tablet (10 mg total) by mouth at bedtime.  Marland Kitchen aspirin EC 81 MG tablet Take 81 mg by mouth at bedtime.   . Cholecalciferol 25 MCG (1000 UT) tablet Take 2,000 Units by mouth daily.  . cilostazol (PLETAL) 50 MG tablet Take 1 tablet (50 mg total) by mouth 2 (two) times daily.  Marland Kitchen gabapentin (NEURONTIN) 100 MG capsule Take 100 mg by mouth 3 (three) times daily.  . Insulin Isophane & Regular Human (NOVOLIN 70/30 FLEXPEN) (70-30) 100 UNIT/ML PEN Inject 20 Units into the skin daily.   . Insulin Pen Needle (FIFTY50 PEN NEEDLES) 32G X 4 MM MISC USE AS DIRECTED TWICE DAILY  . ketoconazole (NIZORAL) 2 % cream Apply 1 application topically daily as needed for irritation.   Marland Kitchen levocetirizine (XYZAL) 5 MG tablet Take 5 mg by mouth at bedtime.   Marland Kitchen loperamide (IMODIUM A-D) 2 MG tablet Take 2  mg by mouth 4 (four) times daily as needed for diarrhea or loose stools.  . metoprolol succinate (TOPROL-XL) 50 MG 24 hr tablet Take 50 mg by mouth at bedtime.   . Pediatric Multivitamins-Iron (FLINTSTONES COMPLETE) 18 MG CHEW Chew 1 tablet by mouth daily.   . rosuvastatin (CRESTOR) 40 MG tablet Take 1 tablet (40 mg total) by mouth at bedtime.  . triamcinolone cream (KENALOG) 0.1 %  Apply 1 application topically daily as needed (spots on legs).      Allergies:   Mercury   Social History   Socioeconomic History  . Marital status: Married    Spouse name: Not on file  . Number of children: Not on file  . Years of education: Not on file  . Highest education level: Not on file  Occupational History  . Not on file  Tobacco Use  . Smoking status: Never Smoker  . Smokeless tobacco: Never Used  Vaping Use  . Vaping Use: Never used  Substance and Sexual Activity  . Alcohol use: No  . Drug use: Never  . Sexual activity: Not on file  Other Topics Concern  . Not on file  Social History Narrative  . Not on file   Social Determinants of Health   Financial Resource Strain:   . Difficulty of Paying Living Expenses: Not on file  Food Insecurity:   . Worried About Charity fundraiser in the Last Year: Not on file  . Ran Out of Food in the Last Year: Not on file  Transportation Needs:   . Lack of Transportation (Medical): Not on file  . Lack of Transportation (Non-Medical): Not on file  Physical Activity:   . Days of Exercise per Week: Not on file  . Minutes of Exercise per Session: Not on file  Stress:   . Feeling of Stress : Not on file  Social Connections:   . Frequency of Communication with Friends and Family: Not on file  . Frequency of Social Gatherings with Friends and Family: Not on file  . Attends Religious Services: Not on file  . Active Member of Clubs or Organizations: Not on file  . Attends Archivist Meetings: Not on file  . Marital Status: Not on file     Family History: The patient's family history includes Breast cancer in her mother; Hypertension in her father and mother. ROS:   Please see the history of present illness.    All 14 point review of systems negative except as described per history of present illness  EKGs/Labs/Other Studies Reviewed:      Recent Labs: 07/18/2019: TSH 3.980 08/29/2019: Hemoglobin 11.3; Platelets  240 09/03/2019: BUN 24; Creatinine, Ser 1.12; Potassium 5.1; Sodium 141  Recent Lipid Panel    Component Value Date/Time   CHOL 169 10/24/2019 1350   TRIG 108 10/24/2019 1350   HDL 56 10/24/2019 1350   CHOLHDL 3.0 10/24/2019 1350   LDLCALC 94 10/24/2019 1350    Physical Exam:    VS:  BP 132/60   Pulse 67   Ht 5\' 2"  (1.575 m)   Wt 160 lb 6.4 oz (72.8 kg)   SpO2 98%   BMI 29.34 kg/m     Wt Readings from Last 3 Encounters:  12/10/19 160 lb 6.4 oz (72.8 kg)  10/24/19 160 lb (72.6 kg)  09/05/19 165 lb 6.4 oz (75 kg)     GEN:  Well nourished, well developed in no acute distress HEENT: Normal NECK: No JVD; No carotid  bruits LYMPHATICS: No lymphadenopathy CARDIAC: RRR, no murmurs, no rubs, no gallops RESPIRATORY:  Clear to auscultation without rales, wheezing or rhonchi  ABDOMEN: Soft, non-tender, non-distended MUSCULOSKELETAL:  No edema; No deformity  SKIN: Warm and dry LOWER EXTREMITIES: no swelling NEUROLOGIC:  Alert and oriented x 3 PSYCHIATRIC:  Normal affect   ASSESSMENT:    1. Peripheral vascular disease ([place])   2. Essential hypertension   3. Type 2 diabetes mellitus without complication, with long-term current use of insulin ([place])   4. Dyslipidemia   5. Claudication in peripheral vascular disease ([place])    PLAN:    In order of problems listed above:  1. Peripheral vascular disease. Conservative approach. We discussed again option for this situation option being surgical intervention versus continuation of present medical therapy. She is feeling much better right now and she prefer continuation of present therapy. Therefore, we'll continue with Pletal as well as aspirin. 2. Dyslipidemia: We'll recheck her fasting lipid profile recently with double the dose of Crestor. 3. Type 2 diabetes that being followed by internal medicine team. I did review her K PN I see hemoglobin A1c from 10/02/2019 being 6.9. This is acceptable #1 continue present  management. 4. Claudications: Much improved with Pletal. I will continue.   Medication Adjustments/Labs and Tests Ordered: Current medicines are reviewed at length with the patient today.  Concerns regarding medicines are outlined above.  No orders of the defined types were placed in this encounter.  Medication changes: No orders of the defined types were placed in this encounter.   Signed, Park Liter, MD, [facility] 12/10/2019 11:27 AM    [place]

## 2019-12-10 NOTE — Progress Notes (Signed)
i

## 2019-12-11 LAB — LIPID PANEL
Chol/HDL Ratio: 2.2 ratio (ref 0.0–4.4)
Cholesterol, Total: 116 mg/dL (ref 100–199)
HDL: 52 mg/dL (ref >39)
LDL Chol Calc (NIH): 46 mg/dL (ref 0–99)
Triglycerides: 94 mg/dL (ref 0–149)
VLDL Cholesterol Cal: 18 mg/dL (ref 5–40)

## 2019-12-17 ENCOUNTER — Other Ambulatory Visit: Payer: Self-pay

## 2019-12-17 ENCOUNTER — Encounter: Payer: Self-pay | Admitting: Cardiovascular Disease

## 2019-12-17 ENCOUNTER — Ambulatory Visit: Payer: Medicare Other | Admitting: Cardiovascular Disease

## 2019-12-17 DIAGNOSIS — I701 Atherosclerosis of renal artery: Secondary | ICD-10-CM

## 2019-12-17 DIAGNOSIS — I739 Peripheral vascular disease, unspecified: Secondary | ICD-10-CM

## 2019-12-17 HISTORY — DX: Atherosclerosis of renal artery: I70.1

## 2019-12-17 NOTE — Patient Instructions (Signed)
Medication Instructions:  The current medical regimen is effective;  continue present plan and medications.  *If you need a refill on your cardiac medications before your next appointment, please call your pharmacy*   Testing/Procedures: Your physician has requested that you have a renal artery duplex. During this test, an ultrasound is used to evaluate blood flow to the kidneys. Allow one hour for this exam. Do not eat after midnight the day before and avoid carbonated beverages. Take your medications as you usually do.   Follow-Up: At [facility], you and your health needs are our priority.  As part of our continuing mission to provide you with exceptional heart care, we have created designated Provider Care Teams.  These Care Teams include your primary Cardiologist (physician) and Advanced Practice Providers (APPs -  Physician Assistants and Nurse Practitioners) who all work together to provide you with the care you need, when you need it.  We recommend signing up for the patient portal called "MyChart".  Sign up information is provided on this After Visit Summary.  MyChart is used to connect with patients for Virtual Visits (Telemedicine).  Patients are able to view lab/test results, encounter notes, upcoming appointments, etc.  Non-urgent messages can be sent to your provider as well.   To learn more about what you can do with MyChart, go to [email or web address].    Your next appointment:   12 month(s)  The format for your next appointment:   In Person  Provider:   Quay Burow, MD

## 2019-12-17 NOTE — Assessment & Plan Note (Signed)
History of PAD status post failed left SFA intervention of a long CTO 08/28/2019.  She did have bilateral SFA CTO's as well as tibial vessel disease.  I did begin her on cilostazol which resulted in marked improvement of claudication.  She is no longer symptomatic.

## 2019-12-17 NOTE — Progress Notes (Signed)
12/17/2019 Molly [name]   November 22, 1950  [id number]  Primary Physician [name], MD Primary Cardiologist: Lorretta Harp MD Lupe Carney, [place]  HPI:  Molly [name] is a 69 y.o.  moderately overweight married African-American female with no children referred by Dr. Agustin Cree for evaluation treatment of symptomatic PAD. She is retired from being a Estate manager/land agent.   I last saw her in the office 09/05/2019.  She is never smoked cigarettes. Her risk factors include treated hypertension, diabetes and hyperlipidemia. Her brother had an MI in his 26s. She is never had heart attack or stroke. She denies chest pain or shortness of breath. She is a claudication which is somewhat symmetric over the last year which is lifestyle limiting. Recent Doppler studies performed 07/10/2019 showed high-grade or occluded distal SFA disease bilaterally.  I performed peripheral angiography on her and failed intervention 08/28/2019.  She had bilateral SFA CTO's with an occluded popliteal and tibial vessels on the left and tibial disease on the right.  I was not successful in opening up her left SFA CTO.  She is discharged home the following day.  I did find a 90% left renal artery stenosis.  Blood pressures at home of been well controlled.  Since I saw her 3 months ago I did begin her on cilostazol which resulted in marked improvement of claudication.  She is no longer symptomatic.  She does have bilateral carotid bruits but normal carotid Doppler studies.  Renal Dopplers performed 09/24/2019 revealed normal renal pole-to-pole dimensions the left RAR of 1.46 suggesting that the 90% left renal artery stenosis which I demonstrated on angiography was not physiologically significant.  We will continue to follow this.   Current Meds  Medication Sig  . amLODipine (NORVASC) 10 MG tablet Take 1 tablet (10 mg total) by mouth at bedtime.  Marland Kitchen aspirin EC 81 MG tablet Take 81 mg by mouth at bedtime.   .  Cholecalciferol 25 MCG (1000 UT) tablet Take 2,000 Units by mouth daily.  . cilostazol (PLETAL) 50 MG tablet Take 1 tablet (50 mg total) by mouth 2 (two) times daily.  Marland Kitchen gabapentin (NEURONTIN) 100 MG capsule Take 100 mg by mouth 3 (three) times daily.  . Insulin Isophane & Regular Human (NOVOLIN 70/30 FLEXPEN) (70-30) 100 UNIT/ML PEN Inject 20 Units into the skin daily.   . Insulin Pen Needle (FIFTY50 PEN NEEDLES) 32G X 4 MM MISC USE AS DIRECTED TWICE DAILY  . ketoconazole (NIZORAL) 2 % cream Apply 1 application topically daily as needed for irritation.   Marland Kitchen levocetirizine (XYZAL) 5 MG tablet Take 5 mg by mouth at bedtime.   Marland Kitchen loperamide (IMODIUM A-D) 2 MG tablet Take 2 mg by mouth 4 (four) times daily as needed for diarrhea or loose stools.  . metoprolol succinate (TOPROL-XL) 50 MG 24 hr tablet Take 50 mg by mouth at bedtime.   . Pediatric Multivitamins-Iron (FLINTSTONES COMPLETE) 18 MG CHEW Chew 1 tablet by mouth daily.   . rosuvastatin (CRESTOR) 40 MG tablet Take 1 tablet (40 mg total) by mouth at bedtime.  . triamcinolone cream (KENALOG) 0.1 % Apply 1 application topically daily as needed (spots on legs).      Allergies  Allergen Reactions  . Mercury Nausea And Vomiting    Social History   Socioeconomic History  . Marital status: Married    Spouse name: Not on file  . Number of children: Not on file  . Years of education: Not on file  .  Highest education level: Not on file  Occupational History  . Not on file  Tobacco Use  . Smoking status: Never Smoker  . Smokeless tobacco: Never Used  Vaping Use  . Vaping Use: Never used  Substance and Sexual Activity  . Alcohol use: No  . Drug use: Never  . Sexual activity: Not on file  Other Topics Concern  . Not on file  Social History Narrative  . Not on file   Social Determinants of Health   Financial Resource Strain:   . Difficulty of Paying Living Expenses: Not on file  Food Insecurity:   . Worried About Charity fundraiser  in the Last Year: Not on file  . Ran Out of Food in the Last Year: Not on file  Transportation Needs:   . Lack of Transportation (Medical): Not on file  . Lack of Transportation (Non-Medical): Not on file  Physical Activity:   . Days of Exercise per Week: Not on file  . Minutes of Exercise per Session: Not on file  Stress:   . Feeling of Stress : Not on file  Social Connections:   . Frequency of Communication with Friends and Family: Not on file  . Frequency of Social Gatherings with Friends and Family: Not on file  . Attends Religious Services: Not on file  . Active Member of Clubs or Organizations: Not on file  . Attends Archivist Meetings: Not on file  . Marital Status: Not on file  Intimate Partner Violence:   . Fear of Current or Ex-Partner: Not on file  . Emotionally Abused: Not on file  . Physically Abused: Not on file  . Sexually Abused: Not on file     Review of Systems: General: negative for chills, fever, night sweats or weight changes.  Cardiovascular: negative for chest pain, dyspnea on exertion, edema, orthopnea, palpitations, paroxysmal nocturnal dyspnea or shortness of breath Dermatological: negative for rash Respiratory: negative for cough or wheezing Urologic: negative for hematuria Abdominal: negative for nausea, vomiting, diarrhea, bright red blood per rectum, melena, or hematemesis Neurologic: negative for visual changes, syncope, or dizziness All other systems reviewed and are otherwise negative except as noted above.    Blood pressure (!) 148/76, pulse 62, height 5\' 2"  (1.575 m), weight 157 lb (71.2 kg), SpO2 97 %.  General appearance: alert and no distress Neck: no adenopathy, no JVD, supple, symmetrical, trachea midline, thyroid not enlarged, symmetric, no tenderness/mass/nodules and High-pitched bilateral carotid bruits Lungs: clear to auscultation bilaterally Heart: regular rate and rhythm, S1, S2 normal, no murmur, click, rub or  gallop Extremities: extremities normal, atraumatic, no cyanosis or edema Pulses: Absent pedal pulses Skin: Skin color, texture, turgor normal. No rashes or lesions Neurologic: Alert and oriented X 3, normal strength and tone. Normal symmetric reflexes. Normal coordination and gait  EKG not performed today  ASSESSMENT AND PLAN:   Peripheral vascular disease ([place]) History of PAD status post failed left SFA intervention of a long CTO 08/28/2019.  She did have bilateral SFA CTO's as well as tibial vessel disease.  I did begin her on cilostazol which resulted in marked improvement of claudication.  She is no longer symptomatic.  Renal artery stenosis (HCC) History of 90% left renal artery stenosis which I demonstrated at the time of peripheral angiography 08/28/2019.  She did have 90% proximal left renal artery stenosis.  She is on 3 antihypertensive medications with a well-controlled blood pressure.  Recent renal Doppler studies performed 09/24/2019 revealed normal pole-to-pole  dimensions with a left RAR of 1.46 suggesting that her renal artery stenosis is not physiologically significant.  We will continue to follow this on annual basis.      Lorretta Harp MD FACP,FACC,FAHA, [facility] 12/17/2019 11:14 AM

## 2019-12-17 NOTE — Assessment & Plan Note (Signed)
History of 90% left renal artery stenosis which I demonstrated at the time of peripheral angiography 08/28/2019.  She did have 90% proximal left renal artery stenosis.  She is on 3 antihypertensive medications with a well-controlled blood pressure.  Recent renal Doppler studies performed 09/24/2019 revealed normal pole-to-pole dimensions with a left RAR of 1.46 suggesting that her renal artery stenosis is not physiologically significant.  We will continue to follow this on annual basis.

## 2020-02-10 ENCOUNTER — Encounter: Payer: Self-pay | Admitting: Podiatry

## 2020-02-10 ENCOUNTER — Ambulatory Visit: Payer: Medicare Other | Admitting: Podiatry

## 2020-02-10 ENCOUNTER — Ambulatory Visit (INDEPENDENT_AMBULATORY_CARE_PROVIDER_SITE_OTHER): Payer: Medicare Other

## 2020-02-10 ENCOUNTER — Other Ambulatory Visit: Payer: Self-pay

## 2020-02-10 VITALS — Temp 98.5°F

## 2020-02-10 DIAGNOSIS — M79674 Pain in right toe(s): Secondary | ICD-10-CM | POA: Diagnosis not present

## 2020-02-10 DIAGNOSIS — L03119 Cellulitis of unspecified part of limb: Secondary | ICD-10-CM

## 2020-02-10 DIAGNOSIS — L02619 Cutaneous abscess of unspecified foot: Secondary | ICD-10-CM

## 2020-02-10 DIAGNOSIS — E119 Type 2 diabetes mellitus without complications: Secondary | ICD-10-CM

## 2020-02-10 DIAGNOSIS — M79675 Pain in left toe(s): Secondary | ICD-10-CM | POA: Diagnosis not present

## 2020-02-10 DIAGNOSIS — B351 Tinea unguium: Secondary | ICD-10-CM

## 2020-02-10 DIAGNOSIS — E1151 Type 2 diabetes mellitus with diabetic peripheral angiopathy without gangrene: Secondary | ICD-10-CM | POA: Diagnosis not present

## 2020-02-10 MED ORDER — DOXYCYCLINE HYCLATE 100 MG PO CAPS: 100.0000 mg | ORAL_CAPSULE | Freq: Two times a day (BID) | ORAL | 0 refills | Status: AC

## 2020-02-10 MED ORDER — BETADINE SKIN CLEANSER 7.5 % EX SOLN: 1.0000 "application " | CUTANEOUS | 0 refills | Status: DC | PRN

## 2020-02-10 NOTE — Progress Notes (Signed)
Subjective:  Patient ID: Molly [name], female    DOB: June 06, 1950,  MRN: [id number]  69 y.o. female presents with with chief concern of painful left foot. Pt has h/o NIDDM with PAD and painful thick toenails that are difficult to trim. Pain interferes with ambulation. Aggravating factors include wearing enclosed shoe gear. Pain is relieved with periodic professional debridement..    Patient did not check her blood glucose on today. States her last A1c was 6.9 %.   She is followed by Molly [name] for PAD with claudication. On Cilostazol.   PCP: Molly Cha, Molly [name] and last visit was:   Review of Systems: Negative except as noted in the HPI.  Past Medical History:  Diagnosis Date  . Abdominal aortic atherosclerosis with stenosis 06/29/2015  . AKI (acute kidney injury) ([place]) 06/29/2015  . Cholecystitis 06/29/2015  . Cholelithiasis 06/29/2015  . Dehydration with hyponatremia 06/29/2015  . Diabetes mellitus ([place]) 07/15/2018  . Diabetes mellitus without complication ([place])   . DKA (diabetic ketoacidoses) 06/29/2015  . Dyslipidemia 08/30/2018  . HTN (hypertension) 06/29/2015  . Hypertension   . 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 Leukocytosis 06/29/2015  . Osteopenia 10/02/2018  . Other dysphagia 09/10/2018  . Peripheral vascular disease ([place]) 07/15/2018   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  Noncritical  . 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 Proximal leg weakness 09/10/2018  . Ptosis of left eyelid 09/10/2018   Past Surgical History:  Procedure Laterality Date  . ABDOMINAL AORTOGRAM W/LOWER EXTREMITY N/A 08/28/2019   Procedure: ABDOMINAL AORTOGRAM W/LOWER EXTREMITY;  Surgeon: Lorretta Harp, Molly [name];  Location: [facility];  Service: Cardiovascular;  Laterality: N/A;  . CATARACT EXTRACTION, BILATERAL    . CHOLECYSTECTOMY N/A 06/30/2015   Procedure: LAPAROSCOPIC CHOLECYSTECTOMY WITH INTRAOPERATIVE CHOLANGIOGRAM;  Surgeon:  Donnie Mesa, Molly [name];  Location: [facility];  Service: General;  Laterality: N/A;  . PERIPHERAL VASCULAR BALLOON ANGIOPLASTY Right 08/28/2019   Procedure: PERIPHERAL VASCULAR BALLOON ANGIOPLASTY;  Surgeon: Lorretta Harp, Molly [name];  Location: [facility];  Service: Cardiovascular;  Laterality: Right;  attempted SFA   Patient Active Problem List   Diagnosis Date Noted  . Renal artery stenosis ([place]) 12/17/2019  . Claudication in peripheral vascular disease ([place]) 08/28/2019  . Bilateral impacted cerumen 08/21/2019  . 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 Osteopenia 10/02/2018  . Other dysphagia 09/10/2018  . 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 Proximal leg weakness 09/10/2018  . Ptosis of left eyelid 09/10/2018  . Dyslipidemia 08/30/2018  . Diabetes mellitus ([place]) 07/15/2018  . Peripheral vascular disease ([place]) 07/15/2018  . DKA (diabetic ketoacidoses) 06/29/2015  . HTN (hypertension) 06/29/2015  . AKI (acute kidney injury) ([place]) 06/29/2015  . Cholelithiasis 06/29/2015  . Abdominal aortic atherosclerosis with stenosis 06/29/2015  . Dehydration with hyponatremia 06/29/2015  . Leukocytosis 06/29/2015  . Cholecystitis 06/29/2015    Current Outpatient Medications:  .  amLODipine (NORVASC) 10 MG tablet, Take 1 tablet (10 mg total) by mouth at bedtime., Disp:  , Rfl:  .  aspirin EC 81 MG tablet, Take 81 mg by mouth at bedtime. , Disp: , Rfl:  .  Cholecalciferol 25 MCG (1000 UT) tablet, Take 2,000 Units by mouth daily., Disp: , Rfl:  .  cilostazol (PLETAL) 50 MG tablet, Take 1 tablet (50 mg total) by mouth 2 (two) times daily., Disp: 180 tablet, Rfl: 1 .  doxycycline (VIBRAMYCIN) 100 MG capsule, Take 1 capsule (100 mg  total) by mouth 2 (two) times daily for 10 days., Disp: 20 capsule, Rfl: 0 .  gabapentin (NEURONTIN) 100 MG capsule, Take 100 mg by mouth 3 (three) times daily., Disp: , Rfl:  .  Insulin Isophane & Regular Human (NOVOLIN 70/30 FLEXPEN)  (70-30) 100 UNIT/ML PEN, Inject 20 Units into the skin daily. , Disp: , Rfl:  .  Insulin Pen Needle (FIFTY50 PEN NEEDLES) 32G X 4 MM MISC, USE AS DIRECTED TWICE DAILY, Disp: , Rfl:  .  ketoconazole (NIZORAL) 2 % cream, Apply 1 application topically daily as needed for irritation. , Disp: , Rfl:  .  levocetirizine (XYZAL) 5 MG tablet, Take 5 mg by mouth at bedtime. , Disp: , Rfl:  .  loperamide (IMODIUM A-D) 2 MG tablet, Take 2 mg by mouth 4 (four) times daily as needed for diarrhea or loose stools., Disp: , Rfl:  .  metoprolol succinate (TOPROL-XL) 50 MG 24 hr tablet, Take 50 mg by mouth at bedtime. , Disp: , Rfl: 0 .  Pediatric Multivitamins-Iron (FLINTSTONES COMPLETE) 18 MG CHEW, Chew 1 tablet by mouth daily. , Disp: , Rfl:  .  povidone-iodine (BETADINE SKIN CLEANSER) 7.5 % SOLN, Apply 1 application topically as needed for wound care., Disp: 118 mL, Rfl: 0 .  rosuvastatin (CRESTOR) 40 MG tablet, Take 1 tablet (40 mg total) by mouth at bedtime., Disp: 90 tablet, Rfl: 1 .  triamcinolone cream (KENALOG) 0.1 %, Apply 1 application topically daily as needed (spots on legs). , Disp: , Rfl:  Allergies  Allergen Reactions  . Mercury Nausea And Vomiting   Social History   Tobacco Use  Smoking Status Never Smoker  Smokeless Tobacco Never Used    Objective:   Vitals:   02/10/20 1632  Temp: 98.5 F (36.9 C)   Constitutional Patient is a pleasant 69 y.o. African American female in NAD. AAO x 3.  Vascular Capillary refill time to digits immediate b/l. Nonpalpable DP pulse(s) b/l lower extremities. Nonpalpable PT pulse(s) b/l lower extremities. Pedal hair absent. Lower extremity skin temperature gradient warm to cool. No pain with calf compression b/l. +1 pitting edema left foot. No cyanosis or clubbing noted.  Neurologic Normal speech. Protective sensation intact 5/5 intact bilaterally with 10g monofilament b/l. Vibratory sensation intact b/l. Proprioception intact bilaterally.  Dermatologic Pedal  skin with normal turgor, texture and tone bilaterally. Toenails 1-5 b/l elongated, discolored, dystrophic, thickened, crumbly with subungual debris and tenderness to dorsal palpation. +Erythema extending from left 4th webspace to dorsum of foot. Ulceration of webspace left foot with mild odor. No purulence expressed. +POP.   Orthopedic: Normal muscle strength 5/5 to all lower extremity muscle groups bilaterally. No pain crepitus or joint limitation noted with ROM b/l. No gross bony deformities bilaterally. Patient ambulates independent of any assistive aids.   Xrays left foot 3 views: No gas in tissues. There is soft tissue swelling present. No bone erosion indicative of osteomyelitis.  ADA FOOT RISK CATEGORIZATION High Risk  Patient has one or more of the following: Loss of protective sensation Absent pedal pulses Severe Foot deformity History of foot ulcer  Assessment:   1. Pain due to onychomycosis of toenails of both feet   2. Cellulitis and abscess of foot, except toes   3. Type II diabetes mellitus with peripheral circulatory disorder (HCC)   4. Encounter for diabetic foot exam [facility])    Plan:  Patient was evaluated and treated and all questions answered.  Onychomycosis with pain -Nails palliatively debridement as below. -Educated  on self-care  Procedure: Nail Debridement Rationale: Pain Type of Debridement: manual, sharp debridement. Instrumentation: Nail nipper, rotary burr. Number of Nails: 10  -Examined patient. -Toenails 1-5 b/l were debrided in length and girth with sterile nail nippers and dremel without iatrogenic bleeding.  -Patient was evaluated and treated and all questions answered.  -Patient/POA/Family member educated on diagnosis and treatment plan of routine ulcer debridement/wound care.  -Patient risk factors affecting healing of foot: diabetes, PAD. -Braylie V Tomey given written instructions on daily wound care for left foot interdigital ulceration. -Xray  of left foot was performed and reviewed with patient and/or POA. -Surgical shoe was dispensed for left foot. -Rx for Doxycyline 100 mg, #20, to be taken bid for 10 days. -Patient to continue soft, supportive shoe gear daily. -Patient/POA to call should there be question/concern in the interim.  Return in about 1 week (around 02/17/2020), or left foot check follow up cellulitis.  Marzetta Board, DPM

## 2020-02-10 NOTE — Patient Instructions (Signed)
DRESSING CHANGES left foot:   A. IF DISPENSED, WEAR SURGICAL SHOE/BOOT AT ALL TIMES.  B. IF PRESCRIBED ORAL ANTIBIOTICS, TAKE ALL MEDICATION AS PRESCRIBED UNTIL ALL ARE GONE.  C. IF DOCTOR HAS DESIGNATED NONWEIGHTBEARING STATUS, PLEASE ADHERE TO INSTRUCTIONS.   1. KEEP left foot DRY AT ALL TIMES!!!!  2. CLEANSE FOOT WITH WOUND CLEANSER.  3. DAB DRY WITH GAUZE SPONGE.  4. APPLY A LIGHT AMOUNT OF BETADINE MOISTENED GAUZE TO BASE OF ULCER.  5. APPLY OUTER DRESSING AS INSTRUCTED.  6. WEAR SURGICAL SHOE/BOOT DAILY AT ALL TIMES. IF SUPPLIED, WEAR HEEL PROTECTORS AT ALL TIMES WHEN IN BED.  7. DO NOT WALK BAREFOOT!!!  8.  IF YOU EXPERIENCE ANY FEVER, CHILLS, NIGHTSWEATS, NAUSEA OR VOMITING, ELEVATED OR LOW BLOOD SUGARS, REPORT TO EMERGENCY ROOM.  9. IF YOU EXPERIENCE INCREASED REDNESS, PAIN, SWELLING, DISCOLORATION, ODOR, PUS, DRAINAGE OR WARMTH OF YOUR FOOT, REPORT TO EMERGENCY ROOM.

## 2020-02-20 ENCOUNTER — Ambulatory Visit (INDEPENDENT_AMBULATORY_CARE_PROVIDER_SITE_OTHER): Payer: Medicare Other | Admitting: Podiatry

## 2020-02-20 ENCOUNTER — Other Ambulatory Visit: Payer: Self-pay

## 2020-02-20 DIAGNOSIS — L02619 Cutaneous abscess of unspecified foot: Secondary | ICD-10-CM

## 2020-02-20 DIAGNOSIS — E1151 Type 2 diabetes mellitus with diabetic peripheral angiopathy without gangrene: Secondary | ICD-10-CM

## 2020-02-20 DIAGNOSIS — L03119 Cellulitis of unspecified part of limb: Secondary | ICD-10-CM | POA: Diagnosis not present

## 2020-02-22 ENCOUNTER — Encounter: Payer: Self-pay | Admitting: Podiatry

## 2020-02-22 NOTE — Progress Notes (Signed)
Subjective:  Patient ID: Molly [name], female    DOB: 12-Feb-1951,  MRN: [id number]  Molly [name] presents to clinic today for follow up cellulitis/abscess of left foot.  She states she has followed instructions for wound care of her left foot. Her foot feels much better. She is to take her last two antibiotics on today.  69 y.o. female presents with the above complaint.    Review of Systems: Negative except as noted in the HPI. Past Medical History:  Diagnosis Date  . Abdominal aortic atherosclerosis with stenosis 06/29/2015  . AKI (acute kidney injury) ([place]) 06/29/2015  . Cholecystitis 06/29/2015  . Cholelithiasis 06/29/2015  . Dehydration with hyponatremia 06/29/2015  . Diabetes mellitus ([place]) 07/15/2018  . Diabetes mellitus without complication ([place])   . DKA (diabetic ketoacidoses) 06/29/2015  . Dyslipidemia 08/30/2018  . HTN (hypertension) 06/29/2015  . Hypertension   . 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 Leukocytosis 06/29/2015  . Osteopenia 10/02/2018  . Other dysphagia 09/10/2018  . Peripheral vascular disease ([place]) 07/15/2018   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  Noncritical  . 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 Proximal leg weakness 09/10/2018  . Ptosis of left eyelid 09/10/2018   Past Surgical History:  Procedure Laterality Date  . ABDOMINAL AORTOGRAM W/LOWER EXTREMITY N/A 08/28/2019   Procedure: ABDOMINAL AORTOGRAM W/LOWER EXTREMITY;  Surgeon: Lorretta Harp, MD;  Location: [facility];  Service: Cardiovascular;  Laterality: N/A;  . CATARACT EXTRACTION, BILATERAL    . CHOLECYSTECTOMY N/A 06/30/2015   Procedure: LAPAROSCOPIC CHOLECYSTECTOMY WITH INTRAOPERATIVE CHOLANGIOGRAM;  Surgeon: Donnie Mesa, MD;  Location: [facility];  Service: General;  Laterality: N/A;  . PERIPHERAL VASCULAR BALLOON ANGIOPLASTY Right 08/28/2019   Procedure: PERIPHERAL VASCULAR BALLOON ANGIOPLASTY;  Surgeon: Lorretta Harp, MD;  Location:  [facility];  Service: Cardiovascular;  Laterality: Right;  attempted SFA    Current Outpatient Medications:  .  amLODipine (NORVASC) 10 MG tablet, Take 1 tablet (10 mg total) by mouth at bedtime., Disp:  , Rfl:  .  aspirin EC 81 MG tablet, Take 81 mg by mouth at bedtime. , Disp: , Rfl:  .  Cholecalciferol 25 MCG (1000 UT) tablet, Take 2,000 Units by mouth daily., Disp: , Rfl:  .  cilostazol (PLETAL) 50 MG tablet, Take 1 tablet (50 mg total) by mouth 2 (two) times daily., Disp: 180 tablet, Rfl: 1 .  gabapentin (NEURONTIN) 100 MG capsule, Take 100 mg by mouth 3 (three) times daily., Disp: , Rfl:  .  Insulin Isophane & Regular Human (NOVOLIN 70/30 FLEXPEN) (70-30) 100 UNIT/ML PEN, Inject 20 Units into the skin daily. , Disp: , Rfl:  .  Insulin Pen Needle (FIFTY50 PEN NEEDLES) 32G X 4 MM MISC, USE AS DIRECTED TWICE DAILY, Disp: , Rfl:  .  ketoconazole (NIZORAL) 2 % cream, Apply 1 application topically daily as needed for irritation. , Disp: , Rfl:  .  levocetirizine (XYZAL) 5 MG tablet, Take 5 mg by mouth at bedtime. , Disp: , Rfl:  .  loperamide (IMODIUM A-D) 2 MG tablet, Take 2 mg by mouth 4 (four) times daily as needed for diarrhea or loose stools., Disp: , Rfl:  .  metoprolol succinate (TOPROL-XL) 50 MG 24 hr tablet, Take 50 mg by mouth at bedtime. , Disp: , Rfl: 0 .  Pediatric Multivitamins-Iron (FLINTSTONES COMPLETE) 18 MG CHEW, Chew 1 tablet by mouth daily. , Disp: , Rfl:  .  povidone-iodine (BETADINE SKIN  CLEANSER) 7.5 % SOLN, Apply 1 application topically as needed for wound care., Disp: 118 mL, Rfl: 0 .  rosuvastatin (CRESTOR) 40 MG tablet, Take 1 tablet (40 mg total) by mouth at bedtime., Disp: 90 tablet, Rfl: 1 .  triamcinolone cream (KENALOG) 0.1 %, Apply 1 application topically daily as needed (spots on legs). , Disp: , Rfl:  Allergies  Allergen Reactions  . Mercury Nausea And Vomiting   Social History   Occupational History  . Not on file  Tobacco Use  . Smoking status:  Never Smoker  . Smokeless tobacco: Never Used  Vaping Use  . Vaping Use: Never used  Substance and Sexual Activity  . Alcohol use: No  . Drug use: Never  . Sexual activity: Not on file    Objective:   Constitutional Molly [name] is a pleasant 69 y.o. African American female, WD, WN in NAD. AAO x 3.   Vascular Capillary refill time to digits immediate b/l. Nonpalpable DP pulse(s) b/l lower extremities. Nonpalpable PT pulse(s) b/l lower extremities. Pedal hair absent. Lower extremity skin temperature gradient warm to cool. Edema resolved left foot. No pain with calf compression b/l. No cyanosis or clubbing noted.  Neurologic Normal speech. Oriented to person, place, and time. Protective sensation intact 5/5 intact bilaterally with 10g monofilament b/l. Vibratory sensation intact b/l.  Dermatologic Pedal skin with normal turgor, texture and tone bilaterally. It seems she has an old insect bite dorsal 4th webspace which has peeling skin and resolved edema/erythema. Skin wrinkles present now. No interdigital macerations bilaterally. Toenails 1-5 b/l well maintained with adequate length. No erythema, no edema, no drainage, no fluctuance.  Orthopedic: Normal muscle strength 5/5 to all lower extremity muscle groups bilaterally. No pain crepitus or joint limitation noted with ROM b/l. No gross bony deformities bilaterally. Patient ambulates independent of any assistive aids.   Radiographs: None Assessment:   1. Cellulitis and abscess of foot, except toes   2. Type II diabetes mellitus with peripheral circulatory disorder [facility])    Plan:  Patient was evaluated and treated and all questions answered. -Examined patient. -Patient to continue soft, supportive shoe gear daily. She may introduce shoe to left foot as tolerated. -Patient to report any pedal injuries to medical professional immediately. -Patient/POA to call should there be question/concern in the interim.  Return in about 8 weeks (around  04/16/2020) for diabetic foot care.  Marzetta Board, DPM

## 2020-03-12 ENCOUNTER — Other Ambulatory Visit: Payer: Self-pay | Admitting: Cardiovascular Disease

## 2020-04-21 ENCOUNTER — Other Ambulatory Visit: Payer: Self-pay

## 2020-04-21 ENCOUNTER — Encounter: Payer: Self-pay | Admitting: Podiatry

## 2020-04-21 ENCOUNTER — Ambulatory Visit: Payer: Medicare Other | Admitting: Podiatry

## 2020-04-21 DIAGNOSIS — R079 Chest pain, unspecified: Secondary | ICD-10-CM | POA: Insufficient documentation

## 2020-04-21 DIAGNOSIS — M79674 Pain in right toe(s): Secondary | ICD-10-CM

## 2020-04-21 DIAGNOSIS — H0520 Unspecified exophthalmos: Secondary | ICD-10-CM | POA: Insufficient documentation

## 2020-04-21 DIAGNOSIS — R1013 Epigastric pain: Secondary | ICD-10-CM

## 2020-04-21 DIAGNOSIS — G619 Inflammatory polyneuropathy, unspecified: Secondary | ICD-10-CM

## 2020-04-21 DIAGNOSIS — G622 Polyneuropathy due to other toxic agents: Secondary | ICD-10-CM

## 2020-04-21 DIAGNOSIS — Z794 Long term (current) use of insulin: Secondary | ICD-10-CM

## 2020-04-21 DIAGNOSIS — I119 Hypertensive heart disease without heart failure: Secondary | ICD-10-CM

## 2020-04-21 DIAGNOSIS — L209 Atopic dermatitis, unspecified: Secondary | ICD-10-CM | POA: Insufficient documentation

## 2020-04-21 DIAGNOSIS — R197 Diarrhea, unspecified: Secondary | ICD-10-CM | POA: Insufficient documentation

## 2020-04-21 DIAGNOSIS — R630 Anorexia: Secondary | ICD-10-CM | POA: Insufficient documentation

## 2020-04-21 DIAGNOSIS — E559 Vitamin D deficiency, unspecified: Secondary | ICD-10-CM | POA: Insufficient documentation

## 2020-04-21 DIAGNOSIS — Z1211 Encounter for screening for malignant neoplasm of colon: Secondary | ICD-10-CM | POA: Insufficient documentation

## 2020-04-21 DIAGNOSIS — R634 Abnormal weight loss: Secondary | ICD-10-CM | POA: Insufficient documentation

## 2020-04-21 DIAGNOSIS — Z7189 Other specified counseling: Secondary | ICD-10-CM | POA: Insufficient documentation

## 2020-04-21 DIAGNOSIS — K909 Intestinal malabsorption, unspecified: Secondary | ICD-10-CM

## 2020-04-21 DIAGNOSIS — R110 Nausea: Secondary | ICD-10-CM

## 2020-04-21 DIAGNOSIS — E1165 Type 2 diabetes mellitus with hyperglycemia: Secondary | ICD-10-CM | POA: Insufficient documentation

## 2020-04-21 DIAGNOSIS — R9389 Abnormal findings on diagnostic imaging of other specified body structures: Secondary | ICD-10-CM

## 2020-04-21 DIAGNOSIS — B351 Tinea unguium: Secondary | ICD-10-CM

## 2020-04-21 DIAGNOSIS — I2510 Atherosclerotic heart disease of native coronary artery without angina pectoris: Secondary | ICD-10-CM

## 2020-04-21 DIAGNOSIS — I6523 Occlusion and stenosis of bilateral carotid arteries: Secondary | ICD-10-CM

## 2020-04-21 DIAGNOSIS — M79675 Pain in left toe(s): Secondary | ICD-10-CM | POA: Diagnosis not present

## 2020-04-21 DIAGNOSIS — K219 Gastro-esophageal reflux disease without esophagitis: Secondary | ICD-10-CM

## 2020-04-21 DIAGNOSIS — E1151 Type 2 diabetes mellitus with diabetic peripheral angiopathy without gangrene: Secondary | ICD-10-CM

## 2020-04-21 DIAGNOSIS — I129 Hypertensive chronic kidney disease with stage 1 through stage 4 chronic kidney disease, or unspecified chronic kidney disease: Secondary | ICD-10-CM | POA: Insufficient documentation

## 2020-04-21 DIAGNOSIS — N189 Chronic kidney disease, unspecified: Secondary | ICD-10-CM | POA: Insufficient documentation

## 2020-04-21 HISTORY — DX: Hypertensive heart disease without heart failure: I11.9

## 2020-04-21 HISTORY — DX: Vitamin D deficiency, unspecified: E55.9

## 2020-04-21 HISTORY — DX: Epigastric pain: R10.13

## 2020-04-21 HISTORY — DX: Atherosclerotic heart disease of native coronary artery without angina pectoris: I25.10

## 2020-04-21 HISTORY — DX: Abnormal weight loss: R63.4

## 2020-04-21 HISTORY — DX: Intestinal malabsorption, unspecified: K90.9

## 2020-04-21 HISTORY — DX: Unspecified exophthalmos: H05.20

## 2020-04-21 HISTORY — DX: Diarrhea, unspecified: R19.7

## 2020-04-21 HISTORY — DX: Nausea: R11.0

## 2020-04-21 HISTORY — DX: Chest pain, unspecified: R07.9

## 2020-04-21 HISTORY — DX: Atopic dermatitis, unspecified: L20.9

## 2020-04-21 HISTORY — DX: Polyneuropathy due to other toxic agents: G61.9

## 2020-04-21 HISTORY — DX: Anorexia: R63.0

## 2020-04-21 HISTORY — DX: Encounter for screening for malignant neoplasm of colon: Z12.11

## 2020-04-21 HISTORY — DX: Long term (current) use of insulin: Z79.4

## 2020-04-21 HISTORY — DX: Type 2 diabetes mellitus with hyperglycemia: E11.65

## 2020-04-21 HISTORY — DX: Occlusion and stenosis of bilateral carotid arteries: I65.23

## 2020-04-21 HISTORY — DX: Gastro-esophageal reflux disease without esophagitis: K21.9

## 2020-04-21 HISTORY — DX: Abnormal findings on diagnostic imaging of other specified body structures: R93.89

## 2020-04-21 HISTORY — DX: Polyneuropathy due to other toxic agents: G62.2

## 2020-04-21 NOTE — Progress Notes (Signed)
Subjective:  Patient ID: Molly [name], female    DOB: [birth year],  MRN: [id number]  Molly [name] presents to clinic today for preventative diabetic foot care, at risk foot care. Pt has h/o NIDDM with PAD and painful thick toenails that are difficult to trim. Pain interferes with ambulation. Aggravating factors include wearing enclosed shoe gear. Pain is relieved with periodic professional debridement..  70 y.o. female presents with the above complaint.  She states her left foot feels fine and infection has completely resolved.  She voices no new pedal concerns on today's visit.  Review of Systems: Negative except as noted in the HPI. Past Medical History:  Diagnosis Date  . Abdominal aortic atherosclerosis with stenosis 06/29/2015  . AKI (acute kidney injury) ([place]) 06/29/2015  . Cholecystitis 06/29/2015  . Cholelithiasis 06/29/2015  . Dehydration with hyponatremia 06/29/2015  . Diabetes mellitus ([place]) 07/15/2018  . Diabetes mellitus without complication ([place])   . DKA (diabetic ketoacidoses) 06/29/2015  . Dyslipidemia 08/30/2018  . HTN (hypertension) 06/29/2015  . Hypertension   . 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 Leukocytosis 06/29/2015  . Osteopenia 10/02/2018  . Other dysphagia 09/10/2018  . Peripheral vascular disease ([place]) 07/15/2018   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  Noncritical  . 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 Proximal leg weakness 09/10/2018  . Ptosis of left eyelid 09/10/2018   Past Surgical History:  Procedure Laterality Date  . ABDOMINAL AORTOGRAM W/LOWER EXTREMITY N/A 08/28/2019   Procedure: ABDOMINAL AORTOGRAM W/LOWER EXTREMITY;  Surgeon: Lorretta Harp, MD;  Location: [facility];  Service: Cardiovascular;  Laterality: N/A;  . CATARACT EXTRACTION, BILATERAL    . CHOLECYSTECTOMY N/A 06/30/2015   Procedure: LAPAROSCOPIC CHOLECYSTECTOMY WITH INTRAOPERATIVE CHOLANGIOGRAM;  Surgeon: Donnie Mesa, MD;   Location: [facility];  Service: General;  Laterality: N/A;  . PERIPHERAL VASCULAR BALLOON ANGIOPLASTY Right 08/28/2019   Procedure: PERIPHERAL VASCULAR BALLOON ANGIOPLASTY;  Surgeon: Lorretta Harp, MD;  Location: [facility];  Service: Cardiovascular;  Laterality: Right;  attempted SFA    Current Outpatient Medications:  .  amLODipine (NORVASC) 10 MG tablet, Take 1 tablet (10 mg total) by mouth at bedtime., Disp:  , Rfl:  .  aspirin EC 81 MG tablet, Take 81 mg by mouth at bedtime. , Disp: , Rfl:  .  Cholecalciferol 25 MCG (1000 UT) tablet, Take 2,000 Units by mouth daily., Disp: , Rfl:  .  cilostazol (PLETAL) 50 MG tablet, Take 1 tablet (50 mg total) by mouth 2 (two) times daily., Disp: 180 tablet, Rfl: 3 .  diphenhydrAMINE (BENADRYL) 25 MG tablet, 1/2 tablet at bedtime as needed, Disp: , Rfl:  .  famotidine (PEPCID) 20 MG tablet, Take 20 mg by mouth 2 (two) times daily., Disp: , Rfl:  .  gabapentin (NEURONTIN) 100 MG capsule, Take 100 mg by mouth 3 (three) times daily., Disp: , Rfl:  .  insulin isophane & regular human (HUMULIN 70/30 MIX) (70-30) 100 UNIT/ML KwikPen, Inject 20 Units into the skin daily. , Disp: , Rfl:  .  Insulin Pen Needle 32G X 4 MM MISC, USE AS DIRECTED TWICE DAILY, Disp: , Rfl:  .  ketoconazole (NIZORAL) 2 % cream, Apply 1 application topically daily as needed for irritation. , Disp: , Rfl:  .  levocetirizine (XYZAL) 5 MG tablet, Take 5 mg by mouth at bedtime. , Disp: , Rfl:  .  loperamide (IMODIUM A-D) 2 MG tablet, Take 2 mg by mouth 4 (  four) times daily as needed for diarrhea or loose stools., Disp: , Rfl:  .  losartan (COZAAR) 50 MG tablet, 1 tablet, Disp: , Rfl:  .  metoprolol succinate (TOPROL-XL) 50 MG 24 hr tablet, Take 50 mg by mouth at bedtime. , Disp: , Rfl: 0 .  Pediatric Multivitamins-Iron (FLINTSTONES COMPLETE) 18 MG CHEW, Chew 1 tablet by mouth daily. , Disp: , Rfl:  .  povidone-iodine (BETADINE SKIN CLEANSER) 7.5 % SOLN, Apply 1 application topically as  needed for wound care., Disp: 118 mL, Rfl: 0 .  rosuvastatin (CRESTOR) 40 MG tablet, Take 1 tablet (40 mg total) by mouth at bedtime., Disp: 90 tablet, Rfl: 1 .  triamcinolone cream (KENALOG) 0.1 %, Apply 1 application topically daily as needed (spots on legs). , Disp: , Rfl:  Allergies  Allergen Reactions  . Mercury Nausea And Vomiting   Social History   Occupational History  . Not on file  Tobacco Use  . Smoking status: Never Smoker  . Smokeless tobacco: Never Used  Vaping Use  . Vaping Use: Never used  Substance and Sexual Activity  . Alcohol use: No  . Drug use: Never  . Sexual activity: Not on file    Objective:   Constitutional Molly [name] is a pleasant 70 y.o. African American female, in NAD.Marland Kitchen AAO x 3.   Vascular \\No  cyanosis or clubbing noted. Pedal hair growth diminished b/l. Capillary fill time to digits <3 seconds b/l lower extremities. Nonpalpable DP pulse(s) b/l lower extremities. Nonpalpable PT pulse(s) b/l lower extremities. Pedal hair absent. Lower extremity skin temperature gradient within normal limits. No pain with calf compression b/l. No edema noted b/l lower extremities. No ischemia or gangrene noted b/l LE.  Neurologic Normal speech. Oriented to person, place, and time.Protective sensation intact 5/5 intact bilaterally with 10g monofilament b/l. Vibratory sensation intact b/l.  Dermatologic Pedal skin with normal turgor, texture and tone bilaterally. No open wounds bilaterally. No interdigital macerations bilaterally. Toenails 1-5 b/l elongated, discolored, dystrophic, thickened, crumbly with subungual debris and tenderness to dorsal palpation.  Orthopedic: Normal muscle strength 5/5 to all lower extremity muscle groups bilaterally. No pain crepitus or joint limitation noted with ROM b/l. Patient ambulates independent of any assistive aids. No bony tenderness.    Assessment:   1. Pain due to onychomycosis of toenails of both feet   2. Type II diabetes mellitus  with peripheral circulatory disorder [facility])    Plan:  Patient was evaluated and treated and all questions answered.  Onychomycosis with pain -Nails palliatively debridement as below -Educated on self-care  Procedure: Nail Debridement Rationale: Pain Type of Debridement: manual, sharp debridement. Instrumentation: Nail nipper, rotary burr. Number of Nails: 10 -Examined patient. -Continue diabetic foot care principles. -Toenails 1-5 b/l were debrided in length and girth with sterile nail nippers and dremel without iatrogenic bleeding.  -Patient to report any pedal injuries to medical professional immediately. -Patient to continue soft, supportive shoe gear daily. -Patient/POA to call should there be question/concern in the interim.  Return in about 3 months (around 07/20/2020) for diabetic foot care.  Marzetta Board, DPM

## 2020-05-04 ENCOUNTER — Encounter (INDEPENDENT_AMBULATORY_CARE_PROVIDER_SITE_OTHER): Payer: Medicare Other | Admitting: Ophthalmology

## 2020-05-08 ENCOUNTER — Other Ambulatory Visit: Payer: Self-pay | Admitting: Cardiology

## 2020-05-10 NOTE — Telephone Encounter (Signed)
Refill sent to pharmacy.   

## 2020-05-14 DIAGNOSIS — E119 Type 2 diabetes mellitus without complications: Secondary | ICD-10-CM | POA: Insufficient documentation

## 2020-05-14 DIAGNOSIS — I10 Essential (primary) hypertension: Secondary | ICD-10-CM | POA: Insufficient documentation

## 2020-05-18 ENCOUNTER — Other Ambulatory Visit: Payer: Self-pay

## 2020-05-18 ENCOUNTER — Encounter: Payer: Self-pay | Admitting: Cardiology

## 2020-05-18 ENCOUNTER — Ambulatory Visit: Payer: Medicare Other | Admitting: Cardiology

## 2020-05-18 VITALS — BP 130/82 | HR 74 | Ht 62.0 in | Wt 155.0 lb

## 2020-05-18 DIAGNOSIS — I701 Atherosclerosis of renal artery: Secondary | ICD-10-CM | POA: Diagnosis not present

## 2020-05-18 DIAGNOSIS — I739 Peripheral vascular disease, unspecified: Secondary | ICD-10-CM

## 2020-05-18 DIAGNOSIS — I119 Hypertensive heart disease without heart failure: Secondary | ICD-10-CM

## 2020-05-18 DIAGNOSIS — I10 Essential (primary) hypertension: Secondary | ICD-10-CM | POA: Diagnosis not present

## 2020-05-18 NOTE — Progress Notes (Signed)
Cardiology Office Note:    Date:  05/18/2020   ID:  Molly [name], DOB 1951/02/22, MRN [id number]  PCP:  [name], MD  Cardiologist:  Jenne Campus, MD    Referring MD: [name],*   Chief Complaint  Patient presents with  . Follow-up  I am doing well  History of Present Illness:    Molly [name] is a 70 y.o. female with past medical history significant for essential hypertension, diabetes, kidney failure, peripheral vascular disease in form of bilateral SFA occlusion.  She did have attempt of intervention of those arteries but however that was unsuccessful.  After that she was put on Pletal seems to be doing quite well with it she is able to walk climb stairs with not much difficulties.  She said her ability to exercise significantly improved after platelet has been initiated.  She did have carotic arteries evaluation done in May 2020 which showed no significant cardiac arterial stenosis.  She denies having any cardiac complaints suggesting presence of significant coronary disease.  Past Medical History:  Diagnosis Date  . Abdominal aortic atherosclerosis with stenosis 06/29/2015  . AKI (acute kidney injury) ([place]) 06/29/2015  . Atherosclerosis of coronary artery without angina pectoris 04/21/2020  . Atopic dermatitis 04/21/2020  . Bilateral impacted cerumen 08/21/2019  . Cholecystitis 06/29/2015  . Cholelithiasis 06/29/2015  . Claudication in peripheral vascular disease ([place]) 08/28/2019   Peripheral arterial disease  . Colon cancer screening 04/21/2020  . Dehydration with hyponatremia 06/29/2015  . Diabetes mellitus ([place]) 07/15/2018  . Diabetes mellitus without complication ([place])   . Diarrhea 04/21/2020  . DKA (diabetic ketoacidoses) 06/29/2015  . Dyslipidemia 08/30/2018  . Epigastric pain 04/21/2020  . Gastroesophageal reflux disease 04/21/2020  . HTN (hypertension) 06/29/2015  . Hyperglycemia due to type 2 diabetes mellitus ([place]) 04/21/2020  . Hypertension   .  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 Left-sided chest pain 04/21/2020  . Leukocytosis 06/29/2015  . Long term (current) use of insulin ([place]) 04/21/2020  . Loss of appetite 04/21/2020  . Nausea 04/21/2020  . Occlusion and stenosis of bilateral carotid arteries 04/21/2020  . Osteopenia 10/02/2018  . Other dysphagia 09/10/2018  . Peripheral vascular disease ([place]) 07/15/2018   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  Noncritical  . 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 Proptosis 04/21/2020  . Proximal leg weakness 09/10/2018  . Ptosis of left eyelid 09/10/2018  . Renal artery stenosis ([place]) 12/17/2019   Renal artery stenosis  . Standard chest x-ray abnormal 04/21/2020  . Vitamin D deficiency 04/21/2020  . Weight loss 04/21/2020    Past Surgical History:  Procedure Laterality Date  . ABDOMINAL AORTOGRAM W/LOWER EXTREMITY N/A 08/28/2019   Procedure: ABDOMINAL AORTOGRAM W/LOWER EXTREMITY;  Surgeon: Lorretta Harp, MD;  Location: [facility];  Service: Cardiovascular;  Laterality: N/A;  . CATARACT EXTRACTION, BILATERAL    . CHOLECYSTECTOMY N/A 06/30/2015   Procedure: LAPAROSCOPIC CHOLECYSTECTOMY WITH INTRAOPERATIVE CHOLANGIOGRAM;  Surgeon: Donnie Mesa, MD;  Location: [facility];  Service: General;  Laterality: N/A;  . PERIPHERAL VASCULAR BALLOON ANGIOPLASTY Right 08/28/2019   Procedure: PERIPHERAL VASCULAR BALLOON ANGIOPLASTY;  Surgeon: Lorretta Harp, MD;  Location: [facility];  Service: Cardiovascular;  Laterality: Right;  attempted SFA    Current Medications: Current Meds  Medication Sig  . amLODipine (NORVASC) 10  MG tablet Take 1 tablet (10 mg total) by mouth at bedtime.  Marland Kitchen aspirin EC 81 MG tablet Take 81 mg by mouth at bedtime.   . Cholecalciferol 25 MCG (1000 UT) tablet Take  2,000 Units by mouth daily.  . cilostazol (PLETAL) 50 MG tablet Take 1 tablet (50 mg total) by mouth 2 (two) times daily.  . diphenhydrAMINE (BENADRYL) 25 MG tablet 1/2 tablet at bedtime as needed  . famotidine (PEPCID) 20 MG tablet Take 20 mg by mouth 2 (two) times daily.  Marland Kitchen gabapentin (NEURONTIN) 100 MG capsule Take 100 mg by mouth 3 (three) times daily.  . insulin isophane & regular human (HUMULIN 70/30 MIX) (70-30) 100 UNIT/ML KwikPen Inject 20 Units into the skin daily.   . Insulin Pen Needle 32G X 4 MM MISC USE AS DIRECTED TWICE DAILY  . ketoconazole (NIZORAL) 2 % cream Apply 1 application topically daily as needed for irritation.   Marland Kitchen levocetirizine (XYZAL) 5 MG tablet Take 5 mg by mouth at bedtime.   Marland Kitchen loperamide (IMODIUM A-D) 2 MG tablet Take 2 mg by mouth 4 (four) times daily as needed for diarrhea or loose stools.  Marland Kitchen losartan (COZAAR) 50 MG tablet 1 tablet  . metoprolol succinate (TOPROL-XL) 50 MG 24 hr tablet Take 50 mg by mouth at bedtime.   . Pediatric Multivitamins-Iron (FLINTSTONES COMPLETE) 18 MG CHEW Chew 1 tablet by mouth daily.   . povidone-iodine (BETADINE SKIN CLEANSER) 7.5 % SOLN Apply 1 application topically as needed for wound care.  . rosuvastatin (CRESTOR) 40 MG tablet TAKE 1 TABLET(40 MG) BY MOUTH AT BEDTIME  . triamcinolone cream (KENALOG) 0.1 % Apply 1 application topically daily as needed (spots on legs).      Allergies:   Mercury   Social History   Socioeconomic History  . Marital status: Married    Spouse name: Not on file  . Number of children: Not on file  . Years of education: Not on file  . Highest education level: Not on file  Occupational History  . Not on file  Tobacco Use  . Smoking status: Never Smoker  . Smokeless tobacco: Never Used  Vaping Use  . Vaping Use: Never used  Substance and Sexual Activity  . Alcohol use: No  . Drug use: Never  . Sexual activity: Not on file  Other Topics Concern  . Not on file  Social History Narrative   . Not on file   Social Determinants of Health   Financial Resource Strain: Not on file  Food Insecurity: Not on file  Transportation Needs: Not on file  Physical Activity: Not on file  Stress: Not on file  Social Connections: Not on file     Family History: The patient's family history includes Breast cancer in her mother; Hypertension in her father and mother. ROS:   Please see the history of present illness.    All 14 point review of systems negative except as described per history of present illness  EKGs/Labs/Other Studies Reviewed:      Recent Labs: 07/18/2019: TSH 3.980 08/29/2019: Hemoglobin 11.3; Platelets 240 09/03/2019: BUN 24; Creatinine, Ser 1.12; Potassium 5.1; Sodium 141  Recent Lipid Panel    Component Value Date/Time   CHOL 116 12/10/2019 1136   TRIG 94 12/10/2019 1136   HDL 52 12/10/2019 1136   CHOLHDL 2.2 12/10/2019 1136   LDLCALC 46 12/10/2019 1136    Physical Exam:    VS:  BP 130/82 (BP Location: Right Arm, Patient Position: Sitting)  Pulse 74   Ht '5\' 2"'$  (1.575 m)   Wt 155 lb (70.3 kg)   SpO2 96%   BMI 28.35 kg/m     Wt Readings from Last 3 Encounters:  05/18/20 155 lb (70.3 kg)  12/17/19 157 lb (71.2 kg)  12/10/19 160 lb 6.4 oz (72.8 kg)     GEN:  Well nourished, well developed in no acute distress HEENT: Normal NECK: No JVD; No carotid bruits LYMPHATICS: No lymphadenopathy CARDIAC: RRR, no murmurs, no rubs, no gallops RESPIRATORY:  Clear to auscultation without rales, wheezing or rhonchi  ABDOMEN: Soft, non-tender, non-distended MUSCULOSKELETAL:  No edema; No deformity  SKIN: Warm and dry LOWER EXTREMITIES: no swelling NEUROLOGIC:  Alert and oriented x 3 PSYCHIATRIC:  Normal affect   ASSESSMENT:    1. Primary hypertension   2. Peripheral vascular disease ([place])   3. Renal artery stenosis ([place])   4. Hypertensive heart disease without congestive heart failure   5. Claudication in peripheral vascular disease ([place])    PLAN:     In order of problems listed above:  1. Essential hypertension blood pressure well controlled continue present management. 2. Peripheral vascular disease with claudication successfully managed with Pletal and she seems to be doing well we will continue present management. 3. Renal artery stenosis however no indication for intervention now. 4. Dyslipidemia I did review her fasting lipid profile from [facility] which show LDL of 63 HDL of 63 this is from 03/30/2020 this is on Crestor 40 which I will continue. 5. Atherosclerosis we will continue antiplatelets therapy as well as risk factors modifications. 6. Diabetes her hemoglobin A1c is 6.9 from 03/30/2020.   Medication Adjustments/Labs and Tests Ordered: Current medicines are reviewed at length with the patient today.  Concerns regarding medicines are outlined above.  Orders Placed This Encounter  Procedures  . EKG 12-Lead   Medication changes: No orders of the defined types were placed in this encounter.   Signed, Park Liter, MD, [facility] 05/18/2020 12:00 PM    [place]

## 2020-05-18 NOTE — Patient Instructions (Signed)
Medication Instructions:  Your physician recommends that you continue on your current medications as directed. Please refer to the Current Medication list given to you today.  *If you need a refill on your cardiac medications before your next appointment, please call your pharmacy*   Lab Work: None If you have labs (blood work) drawn today and your tests are completely normal, you will receive your results only by: Marland Kitchen MyChart Message (if you have MyChart) OR . A paper copy in the mail If you have any lab test that is abnormal or we need to change your treatment, we will call you to review the results.   Testing/Procedures: [place]   Follow-Up: At [facility], you and your health needs are our priority.  As part of our continuing mission to provide you with exceptional heart care, we have created designated Provider Care Teams.  These Care Teams include your primary Cardiologist (physician) and Advanced Practice Providers (APPs -  Physician Assistants and Nurse Practitioners) who all work together to provide you with the care you need, when you need it.  We recommend signing up for the patient portal called "MyChart".  Sign up information is provided on this After Visit Summary.  MyChart is used to connect with patients for Virtual Visits (Telemedicine).  Patients are able to view lab/test results, encounter notes, upcoming appointments, etc.  Non-urgent messages can be sent to your provider as well.   To learn more about what you can do with MyChart, go to [email or web address].    Your next appointment:   6 month(s)  The format for your next appointment:   In Person  Provider:   Jenne Campus, MD   Other Instructions

## 2020-05-20 ENCOUNTER — Encounter: Payer: Self-pay | Admitting: Physician Assistant

## 2020-06-03 ENCOUNTER — Other Ambulatory Visit: Payer: Self-pay

## 2020-06-03 ENCOUNTER — Encounter: Payer: Self-pay | Admitting: Physician Assistant

## 2020-06-03 ENCOUNTER — Other Ambulatory Visit (INDEPENDENT_AMBULATORY_CARE_PROVIDER_SITE_OTHER): Payer: Medicare Other

## 2020-06-03 ENCOUNTER — Ambulatory Visit: Payer: Medicare Other | Admitting: Physician Assistant

## 2020-06-03 VITALS — Ht 62.0 in | Wt 157.6 lb

## 2020-06-03 DIAGNOSIS — R1084 Generalized abdominal pain: Secondary | ICD-10-CM

## 2020-06-03 DIAGNOSIS — R197 Diarrhea, unspecified: Secondary | ICD-10-CM | POA: Diagnosis not present

## 2020-06-03 LAB — CBC WITH DIFFERENTIAL/PLATELET
Basophils Absolute: 0 10*3/uL (ref 0.0–0.1)
Basophils Relative: 0.7 % (ref 0.0–3.0)
Eosinophils Absolute: 0.4 10*3/uL (ref 0.0–0.7)
Eosinophils Relative: 7.9 % — ABNORMAL HIGH (ref 0.0–5.0)
HCT: 38.6 % (ref 36.0–46.0)
Hemoglobin: 12.7 g/dL (ref 12.0–15.0)
Lymphocytes Relative: 37.6 % (ref 12.0–46.0)
Lymphs Abs: 1.8 10*3/uL (ref 0.7–4.0)
MCHC: 32.9 g/dL (ref 30.0–36.0)
MCV: 83.5 fl (ref 78.0–100.0)
Monocytes Absolute: 0.5 10*3/uL (ref 0.1–1.0)
Monocytes Relative: 9.7 % (ref 3.0–12.0)
Neutro Abs: 2.1 10*3/uL (ref 1.4–7.7)
Neutrophils Relative %: 44.1 % (ref 43.0–77.0)
Platelets: 272 10*3/uL (ref 150.0–400.0)
RBC: 4.62 Mil/uL (ref 3.87–5.11)
RDW: 14.3 % (ref 11.5–15.5)
WBC: 4.7 10*3/uL (ref 4.0–10.5)

## 2020-06-03 LAB — COMPREHENSIVE METABOLIC PANEL
ALT: 13 U/L (ref 0–35)
AST: 17 U/L (ref 0–37)
Albumin: 3.7 g/dL (ref 3.5–5.2)
Alkaline Phosphatase: 71 U/L (ref 39–117)
BUN: 28 mg/dL — ABNORMAL HIGH (ref 6–23)
CO2: 30 mEq/L (ref 19–32)
Calcium: 9.2 mg/dL (ref 8.4–10.5)
Chloride: 106 mEq/L (ref 96–112)
Creatinine, Ser: 1.33 mg/dL — ABNORMAL HIGH (ref 0.40–1.20)
GFR: 41.33 mL/min — ABNORMAL LOW (ref >60.00)
Glucose, Bld: 128 mg/dL — ABNORMAL HIGH (ref 70–99)
Potassium: 4.6 mEq/L (ref 3.5–5.1)
Sodium: 139 mEq/L (ref 135–145)
Total Bilirubin: 0.3 mg/dL (ref 0.2–1.2)
Total Protein: 7.1 g/dL (ref 6.0–8.3)

## 2020-06-03 MED ORDER — DICYCLOMINE HCL 10 MG PO CAPS: 10.0000 mg | ORAL_CAPSULE | Freq: Two times a day (BID) | ORAL | 3 refills | Status: DC

## 2020-06-03 NOTE — Progress Notes (Addendum)
Chief Complaint: Diarrhea and abdominal pain  HPI:    Molly [name] is a 70 year old African-American female with a past medical history as listed below, previously seen by [facility], who was referred to me by [name],* for a complaint of area and abdominal pain.      05/21/2017 office visit with [facility] physicians discussed follow-up for diarrhea.  Apparently she had been found to have E. coli and was treated with ciprofloxacin 500 mg twice daily for 5 days and at that time had improvement of symptoms, it was noted she was previously on cholestyramine for a thought of bile salt induced diarrhea.    Today, the patient tells me that ever since 2017 when she had her gallbladder out she has had trouble with diarrhea.  Describes she has at least 5 "rounds" a day and these are very loose and urgent, this has increased recently and tells me that she "has no idea what is going to come on", this is so severe that she has stopped going out to eat at restaurants and really does not leave much at all.  She will rarely have constipation for which she eats Activia yogurt and this helps.  Tells me that the Cholestyramine never helped in the past and she did not feel like the antibiotics relieved her symptoms.     Also describes that she has developed allergies over the past few years with all of these GI symptoms and asked about "leaky gut".  Apparently went to see a dermatologist and is now on Pepcid and Xyzal which is helped with the itching.  Also describes that some days she will not eat at all for fear of diarrhea.    Describes a previous colonoscopy about 4 years ago with [facility] and endoscopy in January 2018.    Denies fever, chills, weight loss or blood in her stool.  Past Medical History:  Diagnosis Date  . Abdominal aortic atherosclerosis with stenosis 06/29/2015  . AKI (acute kidney injury) ([place]) 06/29/2015  . Atherosclerosis of coronary artery without angina pectoris 04/21/2020  .  Atopic dermatitis 04/21/2020  . Bilateral impacted cerumen 08/21/2019  . Cholecystitis 06/29/2015  . Cholelithiasis 06/29/2015  . Claudication in peripheral vascular disease ([place]) 08/28/2019   Peripheral arterial disease  . Colon cancer screening 04/21/2020  . Dehydration with hyponatremia 06/29/2015  . Diabetes mellitus ([place]) 07/15/2018  . Diabetes mellitus without complication ([place])   . Diarrhea 04/21/2020  . DKA (diabetic ketoacidoses) 06/29/2015  . Dyslipidemia 08/30/2018  . Epigastric pain 04/21/2020  . Gastroesophageal reflux disease 04/21/2020  . HTN (hypertension) 06/29/2015  . Hyperglycemia due to type 2 diabetes mellitus ([place]) 04/21/2020  . Hypertension   . 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 Left-sided chest pain 04/21/2020  . Leukocytosis 06/29/2015  . Long term (current) use of insulin ([place]) 04/21/2020  . Loss of appetite 04/21/2020  . Nausea 04/21/2020  . Occlusion and stenosis of bilateral carotid arteries 04/21/2020  . Osteopenia 10/02/2018  . Other dysphagia 09/10/2018  . Peripheral vascular disease ([place]) 07/15/2018   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  Noncritical  . 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 Proptosis 04/21/2020  . Proximal leg weakness 09/10/2018  . Ptosis of left eyelid 09/10/2018  . Renal artery stenosis ([facility]) 12/17/2019   Renal artery stenosis  . Standard  chest x-ray abnormal 04/21/2020  . Vitamin D deficiency 04/21/2020  . Weight loss 04/21/2020    Past Surgical History:  Procedure Laterality Date  . ABDOMINAL AORTOGRAM W/LOWER EXTREMITY N/A 08/28/2019   Procedure: ABDOMINAL AORTOGRAM W/LOWER EXTREMITY;  Surgeon: Lorretta Harp, MD;  Location: [place] [facility];  Service: Cardiovascular;  Laterality: N/A;  . CATARACT EXTRACTION, BILATERAL    . CHOLECYSTECTOMY N/A  06/30/2015   Procedure: LAPAROSCOPIC CHOLECYSTECTOMY WITH INTRAOPERATIVE CHOLANGIOGRAM;  Surgeon: Donnie Mesa, MD;  Location: [place];  Service: General;  Laterality: N/A;  . PERIPHERAL VASCULAR BALLOON ANGIOPLASTY Right 08/28/2019   Procedure: PERIPHERAL VASCULAR BALLOON ANGIOPLASTY;  Surgeon: Lorretta Harp, MD;  Location: [place] [facility];  Service: Cardiovascular;  Laterality: Right;  attempted SFA    Current Outpatient Medications  Medication Sig Dispense Refill  . amLODipine (NORVASC) 10 MG tablet Take 1 tablet (10 mg total) by mouth at bedtime.    Marland Kitchen aspirin EC 81 MG tablet Take 81 mg by mouth at bedtime.     . Cholecalciferol 25 MCG (1000 UT) tablet Take 2,000 Units by mouth daily.    . cilostazol (PLETAL) 50 MG tablet Take 1 tablet (50 mg total) by mouth 2 (two) times daily. 180 tablet 3  . dicyclomine (BENTYL) 10 MG capsule Take 1 capsule (10 mg total) by mouth in the morning and at bedtime. 60 capsule 3  . diphenhydrAMINE (BENADRYL) 25 MG tablet 1/2 tablet at bedtime as needed    . famotidine (PEPCID) 20 MG tablet Take 20 mg by mouth 2 (two) times daily.    Marland Kitchen gabapentin (NEURONTIN) 100 MG capsule Take 100 mg by mouth 3 (three) times daily.    . insulin isophane & regular human (HUMULIN 70/30 MIX) (70-30) 100 UNIT/ML KwikPen Inject 16-20 Units into the skin daily.    . Insulin Pen Needle 32G X 4 MM MISC USE AS DIRECTED TWICE DAILY    . ketoconazole (NIZORAL) 2 % cream Apply 1 application topically daily as needed for irritation.     Marland Kitchen levocetirizine (XYZAL) 5 MG tablet Take 5 mg by mouth at bedtime.     Marland Kitchen loperamide (IMODIUM A-D) 2 MG tablet Take 2 mg by mouth 4 (four) times daily as needed for diarrhea or loose stools.    Marland Kitchen losartan (COZAAR) 50 MG tablet 1 tablet    . Pediatric Multivitamins-Iron (FLINTSTONES COMPLETE) 18 MG CHEW Chew 1 tablet by mouth daily.     . rosuvastatin (CRESTOR) 40 MG tablet TAKE 1 TABLET(40 MG) BY MOUTH AT BEDTIME 90 tablet 1  . triamcinolone cream  (KENALOG) 0.1 % Apply 1 application topically daily as needed (spots on legs).      No current facility-administered medications for this visit.    Allergies as of 06/03/2020 - Review Complete 06/03/2020  Allergen Reaction Noted  . Mercury Nausea And Vomiting 06/21/2015    Family History  Problem Relation Age of Onset  . Breast cancer Mother   . Hypertension Mother   . Hypertension Father   . Pancreatic cancer Other        uncle    Social History   Socioeconomic History  . Marital status: Married    Spouse name: Not on file  . Number of children: Not on file  . Years of education: Not on file  . Highest education level: Not on file  Occupational History  . Not on file  Tobacco Use  . Smoking status: Never Smoker  . Smokeless tobacco: Never Used  Vaping Use  . Vaping Use: Never used  Substance and Sexual Activity  . Alcohol use: No  . Drug use: Never  . Sexual activity: Not on file  Other Topics Concern  . Not on file  Social History Narrative  . Not on file   Social Determinants of Health   Financial Resource Strain: Not on file  Food Insecurity: Not on file  Transportation Needs: Not on file  Physical Activity: Not on file  Stress: Not on file  Social Connections: Not on file  Intimate Partner Violence: Not on file    Review of Systems:    Constitutional: No weight loss, fever or chills Skin: No rash  Cardiovascular: No chest pain Respiratory: No SOB  Gastrointestinal: See HPI and otherwise negative Genitourinary: No dysuria Neurological: No headache, dizziness or syncope Musculoskeletal: No new muscle or joint pain Hematologic: No bleeding  Psychiatric: No history of depression or anxiety   Physical Exam:  Vital signs: Ht '5\' 2"'$  (1.575 m)   Wt 157 lb 9.6 oz (71.5 kg)   BMI 28.83 kg/m   Constitutional:   Pleasant Elderly AA female appears to be in NAD, Well developed, Well nourished, alert and cooperative Head:  Normocephalic and  atraumatic. Eyes:   PEERL, EOMI. No icterus. Conjunctiva pink. Ears:  Normal auditory acuity. Neck:  Supple Throat: Oral cavity and pharynx without inflammation, swelling or lesion.  Respiratory: Respirations even and unlabored. Lungs clear to auscultation bilaterally.   No wheezes, crackles, or rhonchi.  Cardiovascular: Normal S1, S2. No MRG. Regular rate and rhythm. No peripheral edema, cyanosis or pallor.  Gastrointestinal:  Soft, nondistended, moderate epigastric ttp. No rebound or guarding. Normal bowel sounds. No appreciable masses or hepatomegaly. Rectal:  Not performed.  Msk:  Symmetrical without gross deformities. Without edema, no deformity or joint abnormality.  Neurologic:  Alert and  oriented x4;  grossly normal neurologically.  Skin:   Dry and intact without significant lesions or rashes. Psychiatric: Demonstrates good judgement and reason without abnormal affect or behaviors.  RELEVANT LABS AND IMAGING: CBC    Component Value Date/Time   WBC 7.2 08/29/2019 0340   RBC 4.09 08/29/2019 0340   HGB 11.3 (L) 08/29/2019 0340   HGB 12.5 08/19/2019 1111   HCT 36.6 08/29/2019 0340   HCT 39.4 08/19/2019 1111   PLT 240 08/29/2019 0340   PLT 299 08/19/2019 1111   MCV 89.5 08/29/2019 0340   MCV 86 08/19/2019 1111   MCH 27.6 08/29/2019 0340   MCHC 30.9 08/29/2019 0340   RDW 13.7 08/29/2019 0340   RDW 12.7 08/19/2019 1111    CMP     Component Value Date/Time   NA 141 09/03/2019 1600   K 5.1 09/03/2019 1600   CL 105 09/03/2019 1600   CO2 25 09/03/2019 1600   GLUCOSE 170 (H) 09/03/2019 1600   GLUCOSE 155 (H) 08/29/2019 0340   BUN 24 09/03/2019 1600   CREATININE 1.12 (H) 09/03/2019 1600   CALCIUM 8.5 (L) 09/03/2019 1600   PROT 4.8 (L) 07/01/2015 0717   ALBUMIN 1.9 (L) 07/01/2015 0717   AST 28 04/12/2018 1153   ALT 24 04/12/2018 1153   ALKPHOS 115 07/01/2015 0717   BILITOT 0.4 07/01/2015 0717   GFRNONAA 51 (L) 09/03/2019 1600   GFRAA 59 (L) 09/03/2019 1600     Assessment: 1.  Abdominal pain: Generalized abdominal cramping at times which seems to not correlate with diarrhea, also in her epigastrium at random times; consider IBS+/-GERD versus other 2.  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  Requesting records from [facility] in regards to EGD and colonoscopy. 2.  We will start with repeat stool studies including fecal lactoferrin, ova and parasites and a GI pathogen panel 3.  Ordered labs today include a CBC, CMP and Celiac studies 4.  Start the patient on Dicyclomine 10 mg twice daily for now.  Patient tells me she only eats 1 meal in the day, recommend she take 1 dose in the morning and another dose 20 minutes before that meal.  Discussed that we can increase the frequency and dose of this if needed. 5.  Pending results from all of above patient may benefit from repeat EGD and colonoscopy. 6.  Would also like to give Cholestyramine another try in the future. 7.  Patient to follow in clinic per recommendations after testing above.  She was assigned to Dr. Henrene Pastor.  Ellouise Newer, PA-C [facility] 06/03/2020, 10:18 AM  Cc: [name],*   Addendum: 06/15/2020 8:56 AM  12/10/17 office visit with [facility] physicians.  At that time as noted she had bile salt induced diarrhea but could not tolerate Cholestyramine powder, was recommended to try Colestid tablet if it returned.  Also noted she had IBS with diarrhea and she is continued on Imodium A-D as needed and Florastor probiotic twice daily.  Apparently GI symptoms had improved on this treatment.  Noted she had a colonoscopy that was normal 12/2015 with negative biopsies for microscopic colitis.  Repeat recommended in 12/2025.  01/11/2016 colonoscopy by Dr. Oletta Lamas for chronic diarrhea with normal mucosa in the entire colon, distal rectum and anal verge are normal, diarrhea of unknown cause  without obvious cause, mucosa biopsy, normal screening colonoscopy pathology showed colonic mucosa with no significant pathology.  No change to plans for now.  Ellouise Newer, PA-C

## 2020-06-03 NOTE — Patient Instructions (Signed)
If you are age 68 or older, your body mass index should be between 23-30. Your Body mass index is 28.83 kg/m. If this is out of the aforementioned range listed, please consider follow up with your Primary Care Provider.  If you are age 4 or younger, your body mass index should be between 19-25. Your Body mass index is 28.83 kg/m. If this is out of the aformentioned range listed, please consider follow up with your Primary Care Provider.   We have sent the following medications to your pharmacy for you to pick up at your convenience:Dicyclomine  Your provider has requested that you go to the basement level for lab work before leaving today. Press "B" on the elevator. The lab is located at the first door on the left as you exit the elevator.  Due to recent changes in healthcare laws, you may see the results of your imaging and laboratory studies on MyChart before your provider has had a chance to review them.  We understand that in some cases there may be results that are confusing or concerning to you. Not all laboratory results come back in the same time frame and the provider may be waiting for multiple results in order to interpret others.  Please give [place] 48 hours in order for your provider to thoroughly review all the results before contacting the office for clarification of your results.   Thank you for choosing me and [facility].  Ellouise Newer, PA-C

## 2020-06-03 NOTE — Progress Notes (Signed)
Assessment and plan reviewed 

## 2020-06-04 ENCOUNTER — Encounter (INDEPENDENT_AMBULATORY_CARE_PROVIDER_SITE_OTHER): Payer: Medicare Other | Admitting: Ophthalmology

## 2020-06-04 LAB — IGA: Immunoglobulin A: 331 mg/dL — ABNORMAL HIGH (ref 70–320)

## 2020-06-04 LAB — TISSUE TRANSGLUTAMINASE, IGA: (tTG) Ab, IgA: <1 U/mL

## 2020-06-11 ENCOUNTER — Telehealth: Payer: Self-pay

## 2020-06-11 NOTE — Telephone Encounter (Signed)
Rec'd patient records from [facility] forwarded 28 pages to [employer] PA

## 2020-06-21 ENCOUNTER — Other Ambulatory Visit: Payer: Medicare Other

## 2020-06-21 DIAGNOSIS — R197 Diarrhea, unspecified: Secondary | ICD-10-CM

## 2020-06-21 DIAGNOSIS — R1084 Generalized abdominal pain: Secondary | ICD-10-CM

## 2020-06-24 LAB — GI PROFILE, STOOL, PCR

## 2020-06-28 ENCOUNTER — Telehealth: Payer: Self-pay | Admitting: Physician Assistant

## 2020-06-28 NOTE — Telephone Encounter (Signed)
Patient is request lab results

## 2020-06-29 NOTE — Telephone Encounter (Signed)
Please let the patient know that so far everything is negative, I am still waiting on 1 further stool study to return which is why we have not called her with the results yet.  So far she is negative for ova and parasites or any infectious cause for diarrhea.  Thanks, J LL

## 2020-06-29 NOTE — Telephone Encounter (Signed)
Spoke with patient in regards to her results. She is aware that we will call her with the remainder of the results. Patient verbalized understanding of all information and had no concerns at the end of the call.

## 2020-07-03 LAB — OVA AND PARASITE EXAMINATION
CONCENTRATE RESULT:: NONE SEEN
MICRO NUMBER:: 11615263
SPECIMEN QUALITY:: ADEQUATE
TRICHROME RESULT:: NONE SEEN

## 2020-07-03 LAB — FECAL LACTOFERRIN, QUANT
Fecal Lactoferrin: NEGATIVE
MICRO NUMBER:: 11615389
SPECIMEN QUALITY:: ADEQUATE

## 2020-07-03 LAB — PANCREATIC ELASTASE, FECAL: Pancreatic Elastase-1, Stool: 104 mcg/g — ABNORMAL LOW

## 2020-07-05 ENCOUNTER — Encounter (INDEPENDENT_AMBULATORY_CARE_PROVIDER_SITE_OTHER): Payer: Medicare Other | Admitting: Ophthalmology

## 2020-07-05 ENCOUNTER — Other Ambulatory Visit: Payer: Self-pay

## 2020-07-05 MED ORDER — PANCRELIPASE (LIP-PROT-AMYL) 36000-114000 UNITS PO CPEP: 72000.0000 [IU] | ORAL_CAPSULE | Freq: Three times a day (TID) | ORAL | 1 refills | Status: DC

## 2020-07-12 DIAGNOSIS — I161 Hypertensive emergency: Secondary | ICD-10-CM | POA: Insufficient documentation

## 2020-07-12 DIAGNOSIS — Z794 Long term (current) use of insulin: Secondary | ICD-10-CM | POA: Insufficient documentation

## 2020-07-12 DIAGNOSIS — I10 Essential (primary) hypertension: Secondary | ICD-10-CM | POA: Insufficient documentation

## 2020-07-12 HISTORY — DX: Hypertensive emergency: I16.1

## 2020-07-13 ENCOUNTER — Other Ambulatory Visit: Payer: Self-pay

## 2020-07-13 ENCOUNTER — Encounter (INDEPENDENT_AMBULATORY_CARE_PROVIDER_SITE_OTHER): Payer: Medicare Other | Admitting: Ophthalmology

## 2020-07-13 DIAGNOSIS — H43813 Vitreous degeneration, bilateral: Secondary | ICD-10-CM | POA: Diagnosis not present

## 2020-07-13 DIAGNOSIS — I10 Essential (primary) hypertension: Secondary | ICD-10-CM

## 2020-07-13 DIAGNOSIS — H35033 Hypertensive retinopathy, bilateral: Secondary | ICD-10-CM

## 2020-07-13 DIAGNOSIS — E113593 Type 2 diabetes mellitus with proliferative diabetic retinopathy without macular edema, bilateral: Secondary | ICD-10-CM | POA: Diagnosis not present

## 2020-07-21 ENCOUNTER — Encounter: Payer: Self-pay | Admitting: Podiatry

## 2020-07-21 ENCOUNTER — Other Ambulatory Visit: Payer: Self-pay

## 2020-07-21 ENCOUNTER — Ambulatory Visit: Payer: Medicare Other | Admitting: Podiatry

## 2020-07-21 DIAGNOSIS — M79675 Pain in left toe(s): Secondary | ICD-10-CM

## 2020-07-21 DIAGNOSIS — B351 Tinea unguium: Secondary | ICD-10-CM | POA: Diagnosis not present

## 2020-07-21 DIAGNOSIS — M79674 Pain in right toe(s): Secondary | ICD-10-CM

## 2020-07-21 DIAGNOSIS — E1151 Type 2 diabetes mellitus with diabetic peripheral angiopathy without gangrene: Secondary | ICD-10-CM | POA: Diagnosis not present

## 2020-07-25 NOTE — Progress Notes (Signed)
  Subjective:  Patient ID: Molly [name], female    DOB: [birth year],  MRN: [id number]  DIEM ZELENY presents to clinic today for preventative diabetic foot care, at risk foot care. Pt has h/o NIDDM with PAD and painful thick toenails that are difficult to trim. Pain interferes with ambulation. Aggravating factors include wearing enclosed shoe gear. Pain is relieved with periodic professional debridement..  70 y.o. female presents with the above complaint.    PCP is Dr. [name].  Endocrinologist is Dr. Buddy Duty. Cardiologist is Dr. Quay Burow.  She voices no new pedal concerns on today's visit. She is happy to report her A1c is down to 6.9%.   Review of Systems: Negative except as noted in the HPI.  Allergies  Allergen Reactions  . Mercury Nausea And Vomiting    Objective:   Constitutional Molly [name] is a pleasant 70 y.o. African American female, in NAD.Marland Kitchen AAO x 3.   Vascular' \\No'$  cyanosis or clubbing noted. Pedal hair growth diminished b/l. Capillary fill time to digits <3 seconds b/l lower extremities. Nonpalpable DP pulse(s) b/l lower extremities. Nonpalpable PT pulse(s) b/l lower extremities. Pedal hair absent. Lower extremity skin temperature gradient within normal limits. No pain with calf compression b/l. No edema noted b/l lower extremities. No ischemia or gangrene noted b/l LE.  Neurologic Normal speech. Oriented to person, place, and time.Protective sensation intact 5/5 intact bilaterally with 10g monofilament b/l. Vibratory sensation intact b/l.  Dermatologic Pedal skin with normal turgor, texture and tone bilaterally. No open wounds bilaterally. No interdigital macerations bilaterally. Toenails 1-5 b/l elongated, discolored, dystrophic, thickened, crumbly with subungual debris and tenderness to dorsal palpation.  Orthopedic: Normal muscle strength 5/5 to all lower extremity muscle groups bilaterally. No pain crepitus or joint limitation noted with ROM b/l. Patient  ambulates independent of any assistive aids. No bony tenderness.    Assessment:   1. Pain due to onychomycosis of toenails of both feet   2. Type II diabetes mellitus with peripheral circulatory disorder [facility])    Plan:  Patient was evaluated and treated and all questions answered.  Onychomycosis with pain -Nails palliatively debridement as below -Educated on self-care  Procedure: Nail Debridement Rationale: Pain Type of Debridement: manual, sharp debridement. Instrumentation: Nail nipper, rotary burr. Number of Nails: 10 -Examined patient. -Continue diabetic foot care principles. -Toenails 1-5 b/l were debrided in length and girth with sterile nail nippers and dremel without iatrogenic bleeding.  -Patient to report any pedal injuries to medical professional immediately. -Patient to continue soft, supportive shoe gear daily. -Patient/POA to call should there be question/concern in the interim.  Return in about 3 months (around 10/20/2020) for diabetic nail trim.  Marzetta Board, DPM

## 2020-08-31 ENCOUNTER — Observation Stay (HOSPITAL_COMMUNITY): Payer: Medicare Other

## 2020-08-31 ENCOUNTER — Encounter (HOSPITAL_COMMUNITY): Payer: Self-pay

## 2020-08-31 ENCOUNTER — Inpatient Hospital Stay (HOSPITAL_COMMUNITY)
Admission: EM | Admit: 2020-08-31 | Discharge: 2020-09-13 | DRG: 291 | Disposition: A | Discharge status: Home Health | Payer: Medicare Other | Attending: Internal Medicine | Admitting: Internal Medicine

## 2020-08-31 ENCOUNTER — Emergency Department (HOSPITAL_COMMUNITY): Payer: Medicare Other

## 2020-08-31 DIAGNOSIS — I700 Atherosclerosis of aorta: Secondary | ICD-10-CM | POA: Diagnosis present

## 2020-08-31 DIAGNOSIS — I959 Hypotension, unspecified: Secondary | ICD-10-CM | POA: Diagnosis present

## 2020-08-31 DIAGNOSIS — Z20822 Contact with and (suspected) exposure to covid-19: Secondary | ICD-10-CM | POA: Diagnosis present

## 2020-08-31 DIAGNOSIS — T463X5A Adverse effect of coronary vasodilators, initial encounter: Secondary | ICD-10-CM | POA: Diagnosis not present

## 2020-08-31 DIAGNOSIS — I701 Atherosclerosis of renal artery: Secondary | ICD-10-CM | POA: Diagnosis present

## 2020-08-31 DIAGNOSIS — N1832 Chronic kidney disease, stage 3b: Secondary | ICD-10-CM | POA: Diagnosis present

## 2020-08-31 DIAGNOSIS — I214 Non-ST elevation (NSTEMI) myocardial infarction: Secondary | ICD-10-CM | POA: Diagnosis not present

## 2020-08-31 DIAGNOSIS — Z7982 Long term (current) use of aspirin: Secondary | ICD-10-CM

## 2020-08-31 DIAGNOSIS — Z794 Long term (current) use of insulin: Secondary | ICD-10-CM

## 2020-08-31 DIAGNOSIS — E871 Hypo-osmolality and hyponatremia: Secondary | ICD-10-CM | POA: Diagnosis present

## 2020-08-31 DIAGNOSIS — J9601 Acute respiratory failure with hypoxia: Secondary | ICD-10-CM | POA: Diagnosis present

## 2020-08-31 DIAGNOSIS — E860 Dehydration: Secondary | ICD-10-CM | POA: Diagnosis present

## 2020-08-31 DIAGNOSIS — D638 Anemia in other chronic diseases classified elsewhere: Secondary | ICD-10-CM | POA: Diagnosis present

## 2020-08-31 DIAGNOSIS — I10 Essential (primary) hypertension: Secondary | ICD-10-CM | POA: Diagnosis not present

## 2020-08-31 DIAGNOSIS — N170 Acute kidney failure with tubular necrosis: Secondary | ICD-10-CM | POA: Diagnosis present

## 2020-08-31 DIAGNOSIS — I5033 Acute on chronic diastolic (congestive) heart failure: Secondary | ICD-10-CM | POA: Diagnosis not present

## 2020-08-31 DIAGNOSIS — I130 Hypertensive heart and chronic kidney disease with heart failure and stage 1 through stage 4 chronic kidney disease, or unspecified chronic kidney disease: Secondary | ICD-10-CM | POA: Diagnosis present

## 2020-08-31 DIAGNOSIS — I161 Hypertensive emergency: Principal | ICD-10-CM | POA: Diagnosis present

## 2020-08-31 DIAGNOSIS — E872 Acidosis: Secondary | ICD-10-CM | POA: Diagnosis not present

## 2020-08-31 DIAGNOSIS — R079 Chest pain, unspecified: Secondary | ICD-10-CM

## 2020-08-31 DIAGNOSIS — K580 Irritable bowel syndrome with diarrhea: Secondary | ICD-10-CM | POA: Diagnosis present

## 2020-08-31 DIAGNOSIS — I2510 Atherosclerotic heart disease of native coronary artery without angina pectoris: Secondary | ICD-10-CM | POA: Diagnosis present

## 2020-08-31 DIAGNOSIS — Z7902 Long term (current) use of antithrombotics/antiplatelets: Secondary | ICD-10-CM

## 2020-08-31 DIAGNOSIS — K219 Gastro-esophageal reflux disease without esophagitis: Secondary | ICD-10-CM | POA: Diagnosis present

## 2020-08-31 DIAGNOSIS — R0602 Shortness of breath: Secondary | ICD-10-CM | POA: Diagnosis present

## 2020-08-31 DIAGNOSIS — E1151 Type 2 diabetes mellitus with diabetic peripheral angiopathy without gangrene: Secondary | ICD-10-CM | POA: Diagnosis present

## 2020-08-31 DIAGNOSIS — J9602 Acute respiratory failure with hypercapnia: Secondary | ICD-10-CM | POA: Diagnosis not present

## 2020-08-31 DIAGNOSIS — R778 Other specified abnormalities of plasma proteins: Secondary | ICD-10-CM | POA: Diagnosis present

## 2020-08-31 DIAGNOSIS — R0902 Hypoxemia: Secondary | ICD-10-CM

## 2020-08-31 DIAGNOSIS — J9811 Atelectasis: Secondary | ICD-10-CM | POA: Diagnosis present

## 2020-08-31 DIAGNOSIS — I169 Hypertensive crisis, unspecified: Secondary | ICD-10-CM | POA: Diagnosis not present

## 2020-08-31 DIAGNOSIS — Z8249 Family history of ischemic heart disease and other diseases of the circulatory system: Secondary | ICD-10-CM

## 2020-08-31 DIAGNOSIS — I739 Peripheral vascular disease, unspecified: Secondary | ICD-10-CM

## 2020-08-31 DIAGNOSIS — E559 Vitamin D deficiency, unspecified: Secondary | ICD-10-CM | POA: Diagnosis present

## 2020-08-31 DIAGNOSIS — N179 Acute kidney failure, unspecified: Secondary | ICD-10-CM | POA: Diagnosis not present

## 2020-08-31 DIAGNOSIS — R34 Anuria and oliguria: Secondary | ICD-10-CM | POA: Diagnosis present

## 2020-08-31 DIAGNOSIS — E1140 Type 2 diabetes mellitus with diabetic neuropathy, unspecified: Secondary | ICD-10-CM | POA: Diagnosis present

## 2020-08-31 DIAGNOSIS — E1165 Type 2 diabetes mellitus with hyperglycemia: Secondary | ICD-10-CM | POA: Diagnosis present

## 2020-08-31 DIAGNOSIS — E119 Type 2 diabetes mellitus without complications: Secondary | ICD-10-CM | POA: Diagnosis not present

## 2020-08-31 DIAGNOSIS — N270 Small kidney, unilateral: Secondary | ICD-10-CM | POA: Diagnosis present

## 2020-08-31 DIAGNOSIS — R001 Bradycardia, unspecified: Secondary | ICD-10-CM | POA: Diagnosis not present

## 2020-08-31 DIAGNOSIS — E876 Hypokalemia: Secondary | ICD-10-CM | POA: Diagnosis not present

## 2020-08-31 DIAGNOSIS — I160 Hypertensive urgency: Secondary | ICD-10-CM | POA: Diagnosis not present

## 2020-08-31 DIAGNOSIS — E1122 Type 2 diabetes mellitus with diabetic chronic kidney disease: Secondary | ICD-10-CM | POA: Diagnosis present

## 2020-08-31 DIAGNOSIS — G4733 Obstructive sleep apnea (adult) (pediatric): Secondary | ICD-10-CM | POA: Diagnosis present

## 2020-08-31 DIAGNOSIS — Z91048 Other nonmedicinal substance allergy status: Secondary | ICD-10-CM

## 2020-08-31 DIAGNOSIS — E785 Hyperlipidemia, unspecified: Secondary | ICD-10-CM | POA: Diagnosis present

## 2020-08-31 DIAGNOSIS — Z79899 Other long term (current) drug therapy: Secondary | ICD-10-CM

## 2020-08-31 DIAGNOSIS — E875 Hyperkalemia: Secondary | ICD-10-CM | POA: Diagnosis present

## 2020-08-31 DIAGNOSIS — Z9049 Acquired absence of other specified parts of digestive tract: Secondary | ICD-10-CM

## 2020-08-31 LAB — GLUCOSE, CAPILLARY
Glucose-Capillary: 127 mg/dL — ABNORMAL HIGH (ref 70–99)
Glucose-Capillary: 156 mg/dL — ABNORMAL HIGH (ref 70–99)

## 2020-08-31 LAB — MRSA PCR SCREENING: MRSA by PCR: NEGATIVE

## 2020-08-31 LAB — TROPONIN I (HIGH SENSITIVITY)
Troponin I (High Sensitivity): 91 ng/L — ABNORMAL HIGH (ref <18)
Troponin I (High Sensitivity): 95 ng/L — ABNORMAL HIGH (ref <18)

## 2020-08-31 LAB — CBC
HCT: 40.2 % (ref 36.0–46.0)
Hemoglobin: 12.4 g/dL (ref 12.0–15.0)
MCH: 27.3 pg (ref 26.0–34.0)
MCHC: 30.8 g/dL (ref 30.0–36.0)
MCV: 88.4 fL (ref 80.0–100.0)
Platelets: 250 10*3/uL (ref 150–400)
RBC: 4.55 MIL/uL (ref 3.87–5.11)
RDW: 14.4 % (ref 11.5–15.5)
WBC: 8.9 10*3/uL (ref 4.0–10.5)
nRBC: 0 % (ref 0.0–0.2)

## 2020-08-31 LAB — BASIC METABOLIC PANEL
Anion gap: 9 (ref 5–15)
BUN: 32 mg/dL — ABNORMAL HIGH (ref 8–23)
CO2: 26 mmol/L (ref 22–32)
Calcium: 9.3 mg/dL (ref 8.9–10.3)
Chloride: 104 mmol/L (ref 98–111)
Creatinine, Ser: 1.38 mg/dL — ABNORMAL HIGH (ref 0.44–1.00)
GFR, Estimated: 42 mL/min — ABNORMAL LOW (ref >60)
Glucose, Bld: 164 mg/dL — ABNORMAL HIGH (ref 70–99)
Potassium: 4.5 mmol/L (ref 3.5–5.1)
Sodium: 139 mmol/L (ref 135–145)

## 2020-08-31 LAB — HEMOGLOBIN A1C
Hgb A1c MFr Bld: 7 % — ABNORMAL HIGH (ref 4.8–5.6)
Mean Plasma Glucose: 154.2 mg/dL

## 2020-08-31 LAB — ECHOCARDIOGRAM COMPLETE
Area-P 1/2: 4.46 cm2
Height: 62 in
S' Lateral: 2.8 cm
Weight: 2544 oz

## 2020-08-31 LAB — LIPID PANEL
Cholesterol: 190 mg/dL (ref 0–200)
HDL: 71 mg/dL (ref >40)
LDL Cholesterol: 107 mg/dL — ABNORMAL HIGH (ref 0–99)
Total CHOL/HDL Ratio: 2.7 RATIO
Triglycerides: 61 mg/dL (ref <150)
VLDL: 12 mg/dL (ref 0–40)

## 2020-08-31 LAB — PROCALCITONIN: Procalcitonin: <0.1 ng/mL

## 2020-08-31 LAB — TSH: TSH: 2.514 u[IU]/mL (ref 0.350–4.500)

## 2020-08-31 LAB — SARS CORONAVIRUS 2 (TAT 6-24 HRS): SARS Coronavirus 2: NEGATIVE

## 2020-08-31 LAB — D-DIMER, QUANTITATIVE: D-Dimer, Quant: 0.83 ug/mL-FEU — ABNORMAL HIGH (ref 0.00–0.50)

## 2020-08-31 LAB — BRAIN NATRIURETIC PEPTIDE: B Natriuretic Peptide: 421 pg/mL — ABNORMAL HIGH (ref 0.0–100.0)

## 2020-08-31 LAB — HEPARIN LEVEL (UNFRACTIONATED): Heparin Unfractionated: 0.65 IU/mL (ref 0.30–0.70)

## 2020-08-31 MED ORDER — PANCRELIPASE (LIP-PROT-AMYL) 12000-38000 UNITS PO CPEP
72000.0000 [IU] | ORAL_CAPSULE | Freq: Three times a day (TID) | ORAL | Status: DC
  Administered 2020-09-02: 72000 [IU] via ORAL
  Filled 2020-08-31 (×15): qty 2
  Filled 2020-08-31: qty 6
  Filled 2020-08-31 (×5): qty 2
  Filled 2020-08-31: qty 6
  Filled 2020-08-31 (×8): qty 2

## 2020-08-31 MED ORDER — HEPARIN (PORCINE) 25000 UT/250ML-% IV SOLN
800.0000 [IU]/h | INTRAVENOUS | Status: DC
  Administered 2020-08-31 – 2020-09-02 (×3): 800 [IU]/h via INTRAVENOUS
  Filled 2020-08-31 (×3): qty 250

## 2020-08-31 MED ORDER — DIPHENHYDRAMINE HCL 12.5 MG/5ML PO ELIX: 12.5000 mg | ORAL_SOLUTION | Freq: Every day | ORAL | Status: DC | PRN

## 2020-08-31 MED ORDER — ASPIRIN 81 MG PO CHEW
324.0000 mg | CHEWABLE_TABLET | Freq: Once | ORAL | Status: DC
  Filled 2020-08-31: qty 4

## 2020-08-31 MED ORDER — NITROGLYCERIN 0.4 MG SL SUBL
0.4000 mg | SUBLINGUAL_TABLET | SUBLINGUAL | Status: DC | PRN
  Administered 2020-08-31 (×2): 0.4 mg via SUBLINGUAL
  Filled 2020-08-31: qty 1

## 2020-08-31 MED ORDER — VITAMIN D 25 MCG (1000 UNIT) PO TABS
2000.0000 [IU] | ORAL_TABLET | Freq: Every day | ORAL | Status: DC
  Administered 2020-08-31 – 2020-09-13 (×13): 2000 [IU] via ORAL
  Filled 2020-08-31 (×13): qty 2

## 2020-08-31 MED ORDER — LOSARTAN POTASSIUM 25 MG PO TABS
50.0000 mg | ORAL_TABLET | Freq: Every day | ORAL | Status: DC
  Administered 2020-08-31: 50 mg via ORAL
  Filled 2020-08-31 (×2): qty 2

## 2020-08-31 MED ORDER — CILOSTAZOL 50 MG PO TABS
50.0000 mg | ORAL_TABLET | Freq: Two times a day (BID) | ORAL | Status: DC
  Filled 2020-08-31: qty 1

## 2020-08-31 MED ORDER — NITROGLYCERIN IN D5W 200-5 MCG/ML-% IV SOLN
0.0000 ug/min | INTRAVENOUS | Status: DC
  Administered 2020-08-31: 5 ug/min via INTRAVENOUS
  Filled 2020-08-31 (×2): qty 250

## 2020-08-31 MED ORDER — ONDANSETRON HCL 4 MG/2ML IJ SOLN: 4.0000 mg | Freq: Four times a day (QID) | INTRAMUSCULAR | Status: DC | PRN

## 2020-08-31 MED ORDER — ALPRAZOLAM 0.25 MG PO TABS
0.2500 mg | ORAL_TABLET | Freq: Two times a day (BID) | ORAL | Status: DC | PRN
  Filled 2020-08-31: qty 1

## 2020-08-31 MED ORDER — TECHNETIUM TO 99M ALBUMIN AGGREGATED
4.1000 | Freq: Once | INTRAVENOUS | Status: AC | PRN
  Administered 2020-08-31: 4.1 via INTRAVENOUS

## 2020-08-31 MED ORDER — HEPARIN BOLUS VIA INFUSION
4000.0000 [IU] | Freq: Once | INTRAVENOUS | Status: AC
  Administered 2020-08-31: 4000 [IU] via INTRAVENOUS
  Filled 2020-08-31: qty 4000

## 2020-08-31 MED ORDER — METOPROLOL SUCCINATE ER 25 MG PO TB24
50.0000 mg | ORAL_TABLET | Freq: Every day | ORAL | Status: DC
  Administered 2020-08-31 – 2020-09-01 (×2): 50 mg via ORAL
  Filled 2020-08-31: qty 2
  Filled 2020-08-31: qty 1

## 2020-08-31 MED ORDER — HEPARIN SODIUM (PORCINE) 5000 UNIT/ML IJ SOLN: 5000.0000 [IU] | Freq: Three times a day (TID) | INTRAMUSCULAR | Status: DC

## 2020-08-31 MED ORDER — ASPIRIN EC 81 MG PO TBEC: 81.0000 mg | DELAYED_RELEASE_TABLET | Freq: Every day | ORAL | Status: DC

## 2020-08-31 MED ORDER — ROSUVASTATIN CALCIUM 20 MG PO TABS
40.0000 mg | ORAL_TABLET | Freq: Every day | ORAL | Status: DC
  Administered 2020-08-31 – 2020-09-13 (×13): 40 mg via ORAL
  Filled 2020-08-31 (×13): qty 2

## 2020-08-31 MED ORDER — ROSUVASTATIN CALCIUM 20 MG PO TABS: 40.0000 mg | ORAL_TABLET | Freq: Every day | ORAL | Status: DC

## 2020-08-31 MED ORDER — CILOSTAZOL 100 MG PO TABS
50.0000 mg | ORAL_TABLET | Freq: Two times a day (BID) | ORAL | Status: DC
  Administered 2020-08-31 – 2020-09-13 (×25): 50 mg via ORAL
  Filled 2020-08-31 (×26): qty 0.5

## 2020-08-31 MED ORDER — FAMOTIDINE 20 MG PO TABS
20.0000 mg | ORAL_TABLET | Freq: Every day | ORAL | Status: DC
  Administered 2020-08-31 – 2020-09-13 (×13): 20 mg via ORAL
  Filled 2020-08-31 (×13): qty 1

## 2020-08-31 MED ORDER — ACETAMINOPHEN 325 MG PO TABS
650.0000 mg | ORAL_TABLET | ORAL | Status: DC | PRN
  Administered 2020-09-07: 650 mg via ORAL
  Filled 2020-08-31: qty 2

## 2020-08-31 MED ORDER — CETIRIZINE HCL 10 MG PO TABS
5.0000 mg | ORAL_TABLET | Freq: Every day | ORAL | Status: DC
  Administered 2020-08-31 – 2020-09-12 (×13): 5 mg via ORAL
  Filled 2020-08-31 (×13): qty 1

## 2020-08-31 MED ORDER — ORAL CARE MOUTH RINSE
15.0000 mL | Freq: Two times a day (BID) | OROMUCOSAL | Status: DC
  Administered 2020-08-31 – 2020-09-12 (×24): 15 mL via OROMUCOSAL

## 2020-08-31 MED ORDER — AMLODIPINE BESYLATE 5 MG PO TABS
10.0000 mg | ORAL_TABLET | Freq: Every day | ORAL | Status: DC
  Administered 2020-08-31 – 2020-09-02 (×3): 10 mg via ORAL
  Filled 2020-08-31 (×3): qty 2

## 2020-08-31 MED ORDER — LOPERAMIDE HCL 2 MG PO CAPS: 2.0000 mg | ORAL_CAPSULE | Freq: Four times a day (QID) | ORAL | Status: DC | PRN

## 2020-08-31 MED ORDER — INSULIN ASPART 100 UNIT/ML IJ SOLN
0.0000 [IU] | Freq: Every day | INTRAMUSCULAR | Status: DC
  Administered 2020-09-07 – 2020-09-12 (×4): 2 [IU] via SUBCUTANEOUS

## 2020-08-31 MED ORDER — CHLORHEXIDINE GLUCONATE CLOTH 2 % EX PADS
6.0000 | MEDICATED_PAD | Freq: Every day | CUTANEOUS | Status: DC
  Administered 2020-08-31 – 2020-09-08 (×8): 6 via TOPICAL

## 2020-08-31 MED ORDER — INSULIN ASPART 100 UNIT/ML IJ SOLN
0.0000 [IU] | Freq: Three times a day (TID) | INTRAMUSCULAR | Status: DC
Start: 2020-08-31 — End: 2020-09-13
  Administered 2020-08-31 – 2020-09-02 (×5): 1 [IU] via SUBCUTANEOUS
  Administered 2020-09-02 – 2020-09-03 (×2): 2 [IU] via SUBCUTANEOUS
  Administered 2020-09-04: 1 [IU] via SUBCUTANEOUS
  Administered 2020-09-04: 2 [IU] via SUBCUTANEOUS
  Administered 2020-09-04 – 2020-09-05 (×2): 1 [IU] via SUBCUTANEOUS
  Administered 2020-09-06: 2 [IU] via SUBCUTANEOUS
  Administered 2020-09-06 – 2020-09-07 (×3): 1 [IU] via SUBCUTANEOUS
  Administered 2020-09-07 – 2020-09-08 (×3): 2 [IU] via SUBCUTANEOUS
  Administered 2020-09-09: 3 [IU] via SUBCUTANEOUS
  Administered 2020-09-09: 2 [IU] via SUBCUTANEOUS
  Administered 2020-09-09: 1 [IU] via SUBCUTANEOUS
  Administered 2020-09-10 (×3): 3 [IU] via SUBCUTANEOUS
  Administered 2020-09-11: 1 [IU] via SUBCUTANEOUS
  Administered 2020-09-11: 2 [IU] via SUBCUTANEOUS
  Administered 2020-09-11: 1 [IU] via SUBCUTANEOUS
  Administered 2020-09-12: 3 [IU] via SUBCUTANEOUS
  Administered 2020-09-12 (×2): 2 [IU] via SUBCUTANEOUS
  Administered 2020-09-13: 3 [IU] via SUBCUTANEOUS
  Administered 2020-09-13: 2 [IU] via SUBCUTANEOUS

## 2020-08-31 MED ORDER — ASPIRIN 81 MG PO CHEW
81.0000 mg | CHEWABLE_TABLET | Freq: Every day | ORAL | Status: DC
  Administered 2020-08-31 – 2020-09-13 (×13): 81 mg via ORAL
  Filled 2020-08-31 (×13): qty 1

## 2020-08-31 MED ORDER — GABAPENTIN 100 MG PO CAPS
100.0000 mg | ORAL_CAPSULE | Freq: Three times a day (TID) | ORAL | Status: DC
  Administered 2020-08-31 – 2020-09-11 (×31): 100 mg via ORAL
  Filled 2020-08-31 (×32): qty 1

## 2020-08-31 NOTE — Progress Notes (Signed)
Noted review of chart reveals Heparin level at .63 which falls within the parameters of therapeutic. No signs of bleeding or c/o pain or discomfort at this time

## 2020-08-31 NOTE — Progress Notes (Signed)
ANTICOAGULATION CONSULT NOTE - Initial Consult  Pharmacy Consult for IV heparin Indication: chest pain/ACS  Allergies  Allergen Reactions  . Mercury Nausea And Vomiting    Patient Measurements: Height: '5\' 2"'$  (157.5 cm) Weight: 72.1 kg (159 lb) IBW/kg (Calculated) : 50.1 Heparin Dosing Weight: 66 kg  Vital Signs: Temp: 98.9 F (37.2 C) (05/17 0643) Temp Source: Oral (05/17 0643) BP: 199/80 (05/17 0900) Pulse Rate: 86 (05/17 0900)  Labs: Recent Labs    08/31/20 0720 08/31/20 0934  HGB 12.4  --   HCT 40.2  --   PLT 250  --   CREATININE 1.38*  --   TROPONINIHS 91* 95*    Estimated Creatinine Clearance: 36.8 mL/min (A) (by C-G formula based on SCr of 1.38 mg/dL (H)).   Medical History: Past Medical History:  Diagnosis Date  . Abdominal aortic atherosclerosis with stenosis 06/29/2015  . AKI (acute kidney injury) ([place]) 06/29/2015  . Atherosclerosis of coronary artery without angina pectoris 04/21/2020  . Atopic dermatitis 04/21/2020  . Bilateral impacted cerumen 08/21/2019  . Cholecystitis 06/29/2015  . Cholelithiasis 06/29/2015  . Claudication in peripheral vascular disease ([place]) 08/28/2019   Peripheral arterial disease  . Colon cancer screening 04/21/2020  . Dehydration with hyponatremia 06/29/2015  . Diabetes mellitus ([place]) 07/15/2018  . Diabetes mellitus without complication ([place])   . Diarrhea 04/21/2020  . DKA (diabetic ketoacidoses) 06/29/2015  . Dyslipidemia 08/30/2018  . Epigastric pain 04/21/2020  . Gastroesophageal reflux disease 04/21/2020  . HTN (hypertension) 06/29/2015  . Hyperglycemia due to type 2 diabetes mellitus ([place]) 04/21/2020  . Hypertension   . 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 Left-sided chest pain 04/21/2020  . Leukocytosis 06/29/2015  . Long term (current) use of insulin ([place])  04/21/2020  . Loss of appetite 04/21/2020  . Nausea 04/21/2020  . Occlusion and stenosis of bilateral carotid arteries 04/21/2020  . Osteopenia 10/02/2018  . Other dysphagia 09/10/2018  . Peripheral vascular disease ([place]) 07/15/2018   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  Noncritical  . 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 Proptosis 04/21/2020  . Proximal leg weakness 09/10/2018  . Ptosis of left eyelid 09/10/2018  . Renal artery stenosis ([place]) 12/17/2019   Renal artery stenosis  . Standard chest x-ray abnormal 04/21/2020  . Vitamin D deficiency 04/21/2020  . Weight loss 04/21/2020    Medications:  (Not in a hospital admission)  Scheduled:  . heparin  4,000 Units Intravenous Once  . metoprolol succinate  50 mg Oral Daily   Infusions:  . heparin    . nitroGLYCERIN      Assessment: 67 yoF with PMH DM2, HTN, CKD3, PAD, presents with chest pain and dyspnea. High sens trop and D-dimer minimally elevated, but chest pain significantly improved with NTG and Pharmacy consulted for IV heparin for ACS r/o. Patient to also undergo VQ scan to r/o PE   Baseline INR, aPTT: not done  Prior anticoagulation: none  Significant events:  Today, 08/31/2020:  CBC: WNL  SCr slightly elevated; appears to be at baseline for CKD  No bleeding or infusion issues per nursing  Goal of Therapy: Heparin level 0.3-0.7 units/ml Monitor platelets by anticoagulation protocol: Yes  Plan:  Heparin 4000 units IV bolus x 1  Heparin 800 units/hr IV infusion  Check heparin level 6-8 hrs after start  Daily CBC,  daily heparin level once stable  Monitor for signs of bleeding or thrombosis  Reuel Boom, PharmD, BCPS [phone] 08/31/2020, 11:19 AM

## 2020-08-31 NOTE — Progress Notes (Signed)
  Echocardiogram 2D Echocardiogram has been performed.  Molly [name] 08/31/2020, 3:44 PM

## 2020-08-31 NOTE — ED Notes (Signed)
Admission Provider at bedside. 

## 2020-08-31 NOTE — ED Notes (Signed)
Headed to complete VQ scan

## 2020-08-31 NOTE — Progress Notes (Signed)
ANTICOAGULATION CONSULT NOTE - Follow Up Consult  Pharmacy Consult for IV heparin Indication: chest pain/ACS  Allergies  Allergen Reactions  . Mercury Nausea And Vomiting    Patient Measurements: Height: '5\' 2"'$  (157.5 cm) Weight: 72.1 kg (159 lb) IBW/kg (Calculated) : 50.1 Heparin Dosing Weight: 66 kg  Vital Signs: BP: 161/49 (05/17 1830) Pulse Rate: 69 (05/17 1800)  Labs: Recent Labs    08/31/20 0720 08/31/20 0934 08/31/20 1823  HGB 12.4  --   --   HCT 40.2  --   --   PLT 250  --   --   HEPARINUNFRC  --   --  0.65  CREATININE 1.38*  --   --   TROPONINIHS 91* 95*  --     Estimated Creatinine Clearance: 36.8 mL/min (A) (by C-G formula based on SCr of 1.38 mg/dL (H)).  Assessment: 60 yoF with PMH DM2, HTN, CKD3, PAD, presents with chest pain and dyspnea. High sens trop and D-dimer minimally elevated, but chest pain significantly improved with NTG and Pharmacy consulted for IV heparin for ACS r/o. Patient to also undergo VQ scan to r/o PE  Today, 08/31/2020:  First heparin level 0.65 at upper end of therapeutic range on heparin 800 units/hr.  May still be seeing some residual effects from 4000 units bolus given reduced renal function  CBC: WNL  SCr slightly elevated; appears to be at baseline for CKD  No bleeding or infusion issues per nursing  Goal of Therapy: Heparin level 0.3-0.7 units/ml Monitor platelets by anticoagulation protocol: Yes  Plan:  Continue Heparin 800 units/hr IV infusion  Recheck heparin level in 6hr to verify therapeutic  Daily CBC, daily heparin level once stable  Monitor for signs of bleeding or thrombosis  Peggyann Juba, PharmD, BCPS Pharmacy: [phone] 08/31/2020, 7:21 PM

## 2020-08-31 NOTE — Progress Notes (Signed)
Renal artery duplex has been completed. Preliminary results can be found in CV Proc through chart review.   08/31/20 1:31 PM Molly [name] RVT

## 2020-08-31 NOTE — ED Notes (Signed)
Patient transported to CT 

## 2020-08-31 NOTE — ED Triage Notes (Signed)
Pt c/o indigestion starting last night and L sided chest pain starting around 2am. Pt c/o SOB, lightheadedness and palpitations. Pt describes pain as tightness/heaviness

## 2020-08-31 NOTE — H&P (Addendum)
History and Physical    Molly [name] [id number] DOB: 1950/07/25 DOA: 08/31/2020  PCP: [name], MD  Patient coming from: Home  Chief Complaint: chest pain  HPI: Molly [name] is a 70 y.o. female with medical history significant of DM2, HTN, CKD3b, PAD. Presenting with chest pain. She had left-sided, non-radiating, dull chest pain starting at 2am this morning. It was 6/10 and intermittent. She had some dyspnea at the time as well. She thought it was indigestion, and she had a little nausea. So, she tried some ginger ale. This did not help. She then had a severe headache. She checked her BP and found that it was 205/91. She took a '325mg'$  ASA and came to the ED. She denies any other aggravating or alleviating factors.   ED Course: EKG was w/o st elevations. Initial trp was elevated at 91. CP improved with NTG. She was found to be hypertensive. She was found to have an elevated d-dimer and BNP. TRH was called for admission.   Review of Systems:  Denies palpitations, lightheadedness, dizziness, syncopal episodes, peripheral swelling, V/D, fevers, sick contacts.  Review of systems is otherwise negative for all not mentioned in HPI.   PMHx Past Medical History:  Diagnosis Date  . Abdominal aortic atherosclerosis with stenosis 06/29/2015  . AKI (acute kidney injury) ([place]) 06/29/2015  . Atherosclerosis of coronary artery without angina pectoris 04/21/2020  . Atopic dermatitis 04/21/2020  . Bilateral impacted cerumen 08/21/2019  . Cholecystitis 06/29/2015  . Cholelithiasis 06/29/2015  . Claudication in peripheral vascular disease ([place]) 08/28/2019   Peripheral arterial disease  . Colon cancer screening 04/21/2020  . Dehydration with hyponatremia 06/29/2015  . Diabetes mellitus ([place]) 07/15/2018  . Diabetes mellitus without complication ([place])   . Diarrhea 04/21/2020  . DKA (diabetic ketoacidoses) 06/29/2015  . Dyslipidemia 08/30/2018  . Epigastric pain 04/21/2020  . Gastroesophageal reflux disease  04/21/2020  . HTN (hypertension) 06/29/2015  . Hyperglycemia due to type 2 diabetes mellitus ([place]) 04/21/2020  . Hypertension   . 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 Left-sided chest pain 04/21/2020  . Leukocytosis 06/29/2015  . Long term (current) use of insulin ([place]) 04/21/2020  . Loss of appetite 04/21/2020  . Nausea 04/21/2020  . Occlusion and stenosis of bilateral carotid arteries 04/21/2020  . Osteopenia 10/02/2018  . Other dysphagia 09/10/2018  . Peripheral vascular disease ([place]) 07/15/2018   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  Noncritical  . 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 Proptosis 04/21/2020  . Proximal leg weakness 09/10/2018  . Ptosis of left eyelid 09/10/2018  . Renal artery stenosis ([place]) 12/17/2019   Renal artery stenosis  . Standard chest x-ray abnormal 04/21/2020  . Vitamin D deficiency 04/21/2020  . Weight loss 04/21/2020    PSHx Past Surgical History:  Procedure Laterality Date  . ABDOMINAL AORTOGRAM W/LOWER EXTREMITY N/A 08/28/2019   Procedure: ABDOMINAL AORTOGRAM W/LOWER EXTREMITY;  Surgeon: Lorretta Harp, MD;  Location: [facility];  Service: Cardiovascular;  Laterality: N/A;  . CATARACT EXTRACTION, BILATERAL    . CHOLECYSTECTOMY N/A 06/30/2015   Procedure: LAPAROSCOPIC CHOLECYSTECTOMY WITH INTRAOPERATIVE CHOLANGIOGRAM;  Surgeon: Donnie Mesa, MD;  Location: [facility];  Service: General;  Laterality: N/A;  . PERIPHERAL VASCULAR BALLOON ANGIOPLASTY Right 08/28/2019   Procedure: PERIPHERAL VASCULAR BALLOON ANGIOPLASTY;  Surgeon: Lorretta Harp, MD;  Location: [facility];  Service: Cardiovascular;  Laterality: Right;  attempted SFA    SocHx  reports that she has never smoked. She has never used smokeless tobacco. She reports that she does not drink  alcohol and does not use drugs.  Allergies  Allergen Reactions  . Mercury Nausea And Vomiting    FamHx Family History  Problem Relation Age of Onset  . Breast cancer Mother   . Hypertension Mother   . Hypertension Father   . Pancreatic cancer Other        uncle    Prior to Admission medications   Medication Sig Start Date End Date Taking? Authorizing Provider  amLODipine (NORVASC) 10 MG tablet Take 1 tablet (10 mg total) by mouth at bedtime. 08/29/19  Yes Lorretta Harp, MD  aspirin EC 81 MG tablet Take 81 mg by mouth at bedtime.    Yes [provider]  Cholecalciferol 25 MCG (1000 UT) tablet Take 2,000 Units by mouth daily.   Yes [provider]  cilostazol (PLETAL) 50 MG tablet Take 1 tablet (50 mg total) by mouth 2 (two) times daily. 03/15/20  Yes Lorretta Harp, MD  diphenhydrAMINE (BENADRYL) 25 MG tablet Take 12.5 mg by mouth daily as needed for allergies or sleep.   Yes [provider]  famotidine (PEPCID) 20 MG tablet Take 20 mg by mouth 2 (two) times daily. 03/05/20  Yes [provider]  gabapentin (NEURONTIN) 100 MG capsule Take 100 mg by mouth 3 (three) times daily.   Yes [provider]  insulin isophane & regular human (HUMULIN 70/30 MIX) (70-30) 100 UNIT/ML KwikPen Inject 16-20 Units into the skin daily. 06/26/18  Yes [provider]  ketoconazole (NIZORAL) 2 % cream Apply 1 application topically daily as needed for irritation.  05/08/19  Yes [provider]  levocetirizine (XYZAL) 5 MG tablet Take 5 mg by mouth at bedtime.  05/12/19  Yes [provider]  lipase/protease/amylase (CREON) 36000 UNITS CPEP capsule Take 2 capsules (72,000 Units total) by mouth with breakfast, with lunch, and with evening meal. Take 1 capsule with snacks. 07/05/20  Yes Levin Erp, PA  loperamide (IMODIUM A-D) 2 MG tablet Take 2 mg by mouth 4 (four) times daily as needed for diarrhea or loose stools.   Yes  [provider]  losartan (COZAAR) 50 MG tablet Take 50 mg by mouth daily.   Yes [provider]  metoprolol succinate (TOPROL-XL) 50 MG 24 hr tablet Take 50 mg by mouth daily. 08/03/20  Yes [provider]  Pediatric Multivitamins-Iron (FLINTSTONES COMPLETE) 18 MG CHEW Chew 1 tablet by mouth daily.    Yes [provider]  rosuvastatin (CRESTOR) 40 MG tablet TAKE 1 TABLET(40 MG) BY MOUTH AT BEDTIME Patient taking differently: Take by mouth daily. 05/10/20  Yes Park Liter, MD  triamcinolone cream (KENALOG) 0.1 % Apply 1 application topically daily as needed (spots on legs).    Yes [provider]  dicyclomine (BENTYL) 10 MG capsule Take 1 capsule (10 mg total) by mouth in the morning and at bedtime. Patient not taking: Reported on 08/31/2020 06/03/20   Levin Erp, PA  Insulin Pen Needle 32G X 4 MM MISC USE AS DIRECTED TWICE DAILY 06/26/18   [provider]    Physical Exam: Vitals:   08/31/20 0815 08/31/20 0830 08/31/20 0845 08/31/20 0859  BP: (!) 191/71 (!) 185/80 (!) 170/74   Pulse: 79 86 85 85  Resp: (!) 23 (!)  25 (!) 22 (!) 21  Temp:      TempSrc:      SpO2: 92% (!) 84% (!) 69% 95%  Weight:      Height:        General: 70 y.o. female resting in bed in NAD Eyes: PERRL, normal sclera ENMT: Nares patent w/o discharge, orophaynx clear, dentition normal, ears w/o discharge/lesions/ulcers Neck: Supple, trachea midline Cardiovascular: RRR, +S1, S2, no m/g/r, equal pulses throughout; reproducible chest pain on exam Respiratory: CTABL, no w/r/r, normal WOB GI: BS+, NDNT, no masses noted, no organomegaly noted MSK: No e/c/c Skin: No rashes, bruises, ulcerations noted Neuro: A&O x 3, no focal deficits Psyc: Appropriate interaction and affect, calm/cooperative  Labs on Admission: I have personally reviewed following labs and imaging studies  CBC: Recent Labs  Lab 08/31/20 0720  WBC 8.9  HGB 12.4  HCT 40.2  MCV  88.4  PLT [id number]   Basic Metabolic Panel: Recent Labs  Lab 08/31/20 0720  NA 139  K 4.5  CL 104  CO2 26  GLUCOSE 164*  BUN 32*  CREATININE 1.38*  CALCIUM 9.3   GFR: Estimated Creatinine Clearance: 36.8 mL/min (A) (by C-G formula based on SCr of 1.38 mg/dL (H)). Liver Function Tests: No results for input(s): AST, ALT, ALKPHOS, BILITOT, PROT, ALBUMIN in the last 168 hours. No results for input(s): LIPASE, AMYLASE in the last 168 hours. No results for input(s): AMMONIA in the last 168 hours. Coagulation Profile: No results for input(s): INR, PROTIME in the last 168 hours. Cardiac Enzymes: No results for input(s): CKTOTAL, CKMB, CKMBINDEX, TROPONINI in the last 168 hours. BNP (last 3 results) No results for input(s): PROBNP in the last 8760 hours. HbA1C: No results for input(s): HGBA1C in the last 72 hours. CBG: No results for input(s): GLUCAP in the last 168 hours. Lipid Profile: No results for input(s): CHOL, HDL, LDLCALC, TRIG, CHOLHDL, LDLDIRECT in the last 72 hours. Thyroid Function Tests: No results for input(s): TSH, T4TOTAL, FREET4, T3FREE, THYROIDAB in the last 72 hours. Anemia Panel: No results for input(s): VITAMINB12, FOLATE, FERRITIN, TIBC, IRON, RETICCTPCT in the last 72 hours. Urine analysis:    Component Value Date/Time   COLORURINE YELLOW 06/29/2015 1636   APPEARANCEUR HAZY (A) 06/29/2015 1636   LABSPEC 1.030 06/29/2015 1636   PHURINE 5.5 06/29/2015 1636   GLUCOSEU >1000 (A) 06/29/2015 1636   HGBUR NEGATIVE 06/29/2015 1636   BILIRUBINUR NEGATIVE 06/29/2015 1636   KETONESUR NEGATIVE 06/29/2015 1636   PROTEINUR NEGATIVE 06/29/2015 1636   NITRITE NEGATIVE 06/29/2015 1636   LEUKOCYTESUR NEGATIVE 06/29/2015 1636    Radiological Exams on Admission: DG Chest 2 View  Result Date: 08/31/2020 CLINICAL DATA:  70 year old female with new onset left chest pain and shortness of breath since last night. EXAM: CHEST - 2 VIEW COMPARISON:  Chest CT 01/10/2016 and  earlier. FINDINGS: PA and lateral views. Patchy abnormal left lung base opacity appears more related to the lingula than the lower lobe on the lateral view. No pleural effusion. No pneumothorax or pulmonary edema. Elsewhere lung markings are stable and within normal limits. Mediastinal contours remain normal. Visualized tracheal air column is within normal limits. Calcified aortic atherosclerosis. No acute osseous abnormality identified. Stable cholecystectomy clips. Negative visible bowel gas pattern. IMPRESSION: Multifocal patchy opacity at the left lung base appears primarily within the lingula. No pleural effusion. This is nonspecific. The patient seem to have had a left lung base pneumonia in 2017 which later resolved. Electronically Signed   By: Lemmie Evens  [place] Crane M.D.   On: 08/31/2020 07:45    EKG: Independently reviewed. Sinus, no st elevations  Assessment/Plan Chest pain Dyspnea Elevated troponins Elevated BNP PAD     - place in obs, tele     - cardiology consulted, appreciate assistance     - initial trp is 91, rpt pending; she does have some reproducible chest pain w/ palpation of sternum; however, she does have risk factors (PAD, DM2)     - EKG w/o st elevations     - right now will have heparin/NTG gtt     - denies Hx of HF, but had an echo a few years ago w/ G1DD     - no peripheral edema; but has multifocal patchy opacity in left lung base; check procal     - checking echo     - also getting v/q scan as d-dimer is elevated     - lipids     - took ASA 325 at home; resume her BB, statin  DM2     - she's NPO right now until cards sees her     - SSI, glucose checks, A1c   HTN     - resume her home regimen  Elevated d-dimer     - in setting of chest pain, dyspnea; her dyspnea has resolved     - checking VQ scan and BLE venous dopplers  CKD3b     - she is at baseline Scr. She follows with nephro. Watch nephrotoxins  HLD     - resume statin, follow lipids  DVT prophylaxis:  heparin  Code Status: FULL  Family Communication: w/ husband at bedside  Consults called: EDP spoke with cardiology   Status is: Observation  The patient remains OBS appropriate and will d/c before 2 midnights.  Dispo: The patient is from: Home              Anticipated d/c is to: Home              Patient currently is not medically stable to d/c.   Difficult to place patient No  Time spent coordinating admission: 50 minutes  [facility]  If 7PM-7AM, please contact night-coverage [email or web address]  08/31/2020, 9:13 AM

## 2020-08-31 NOTE — Consult Note (Addendum)
Cardiology Consultation:   Patient ID: Molly [name] MRN: [id number]; DOB: 08/16/1950  Admit date: 08/31/2020 Date of Consult: 08/31/2020  PCP:  [name], MD   [facility] Providers Cardiologist: Jenne Campus, MD 05/18/2020 PAD: Dr Gwenlyn Found    Patient Profile:   Molly [name] is a 70 y.o. female with a hx of DM, HTN, HLD, FH CAD, PAD w/ bilat SFA CTO's at [facility] cath 08/2019 w/ L RAS 90%, CKD III, who is being seen 08/31/2020 for the evaluation of chest pain and elevated troponin at the request of Dr Marylyn Ishihara.  History of Present Illness:   Molly [name] has not been on any long trips.  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  She has not been immobilized.  She admits that she does not exercise like she should, but feels that her activity level is pretty good and has no problems with it.  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  She did not notice any new dyspnea on exertion.  She has not woken with lower extremity edema, and denies orthopnea or PND until last night.  She felt fine when she went to bed about 1130.  She had trouble getting comfortable, but finally dropped off.  She woke up at about 2 in the morning.  She was having shortness of breath and chest pressure at a 7/10.  She also needed to use the restroom.  After she used the restroom, she could not ever really get back to sleep for any length of time.  She could not well and was still having the pain.  She has had a pressure type chest pain before, but thought it was indigestion.  She would drink ginger ale and the symptoms would resolve.  Last night ginger ale was no help.  Finally about 6 AM, she got her husband up and they decided to come to the emergency room.  She took a full-strength aspirin prior to leaving the house.  In the emergency room, she got sublingual  nitroglycerin and the chest pain went from a 7/10 down to a 2/10.  Her chest pain is currently a 2/10.  Her blood pressure was extremely high when she got here at 191/83 and the nitroglycerin has helped that.  However, her blood pressure is trending up again and is now 199/80.  She is still very short of breath.  The shortness of breath has not changed or improved overnight.  She was placed on O2 by myself.  She has no history of consistent exertional chest pain.  The episodes of chest pressure that she has had previously were infrequent, and not generally associated with exertion.   Past Medical History:  Diagnosis Date  . Abdominal aortic atherosclerosis with stenosis 06/29/2015  . AKI (acute kidney injury) ([place]) 06/29/2015  . Atherosclerosis of coronary artery without angina pectoris 04/21/2020  . Atopic dermatitis 04/21/2020  . Bilateral impacted cerumen 08/21/2019  . Cholecystitis 06/29/2015  . Cholelithiasis 06/29/2015  . Claudication in peripheral vascular disease ([place]) 08/28/2019   Peripheral arterial disease  . Colon cancer screening 04/21/2020  . Dehydration with hyponatremia 06/29/2015  . Diabetes mellitus ([place]) 07/15/2018  . Diabetes mellitus without complication ([place])   . Diarrhea 04/21/2020  . DKA (diabetic ketoacidoses) 06/29/2015  . Dyslipidemia 08/30/2018  . Epigastric pain  04/21/2020  . Gastroesophageal reflux disease 04/21/2020  . HTN (hypertension) 06/29/2015  . Hyperglycemia due to type 2 diabetes mellitus ([place]) 04/21/2020  . Hypertension   . 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 Left-sided chest pain 04/21/2020  . Leukocytosis 06/29/2015  . Long term (current) use of insulin ([place]) 04/21/2020  . Loss of appetite 04/21/2020  . Nausea 04/21/2020  . Occlusion and stenosis of bilateral carotid arteries 04/21/2020  .  Osteopenia 10/02/2018  . Other dysphagia 09/10/2018  . Peripheral vascular disease ([place]) 07/15/2018   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  Noncritical  . 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 Proptosis 04/21/2020  . Proximal leg weakness 09/10/2018  . Ptosis of left eyelid 09/10/2018  . Renal artery stenosis ([place]) 12/17/2019   Renal artery stenosis  . Standard chest x-ray abnormal 04/21/2020  . Vitamin D deficiency 04/21/2020  . Weight loss 04/21/2020    Past Surgical History:  Procedure Laterality Date  . ABDOMINAL AORTOGRAM W/LOWER EXTREMITY N/A 08/28/2019   Procedure: ABDOMINAL AORTOGRAM W/LOWER EXTREMITY;  Surgeon: Lorretta Harp, MD;  Location: [place] [facility];  Service: Cardiovascular;  Laterality: N/A;  . CATARACT EXTRACTION, BILATERAL    . CHOLECYSTECTOMY N/A 06/30/2015   Procedure: LAPAROSCOPIC CHOLECYSTECTOMY WITH INTRAOPERATIVE CHOLANGIOGRAM;  Surgeon: Donnie Mesa, MD;  Location: [place];  Service: General;  Laterality: N/A;  . PERIPHERAL VASCULAR BALLOON ANGIOPLASTY Right 08/28/2019   Procedure: PERIPHERAL VASCULAR BALLOON ANGIOPLASTY;  Surgeon: Lorretta Harp, MD;  Location: [place] [facility];  Service: Cardiovascular;  Laterality: Right;  attempted SFA     Home Medications:  Prior to Admission medications   Medication Sig Start Date End Date Taking? Authorizing Provider  amLODipine (NORVASC) 10 MG tablet Take 1 tablet (10 mg total) by mouth at bedtime. 08/29/19  Yes Lorretta Harp, MD  aspirin EC 81 MG tablet Take 81 mg by mouth at bedtime.    Yes [provider]  Cholecalciferol 25 MCG (1000 UT) tablet Take 2,000 Units by mouth daily.   Yes [provider]  cilostazol (PLETAL) 50 MG tablet Take 1 tablet (50 mg total) by mouth 2 (two) times daily. 03/15/20  Yes Lorretta Harp, MD  diphenhydrAMINE (BENADRYL) 25 MG tablet Take 12.5 mg by mouth daily as needed for allergies or sleep.   Yes [provider]   famotidine (PEPCID) 20 MG tablet Take 20 mg by mouth 2 (two) times daily. 03/05/20  Yes [provider]  gabapentin (NEURONTIN) 100 MG capsule Take 100 mg by mouth 3 (three) times daily.   Yes [provider]  insulin isophane & regular human (HUMULIN 70/30 MIX) (70-30) 100 UNIT/ML KwikPen Inject 16-20 Units into the skin daily. 06/26/18  Yes [provider]  ketoconazole (NIZORAL) 2 % cream Apply 1 application topically daily as needed for irritation.  05/08/19  Yes [provider]  levocetirizine (XYZAL) 5 MG tablet Take 5 mg by mouth at bedtime.  05/12/19  Yes [provider]  lipase/protease/amylase (CREON) 36000 UNITS CPEP capsule Take 2 capsules (72,000 Units total) by mouth with breakfast, with lunch, and with evening meal. Take 1 capsule with snacks. 07/05/20  Yes Levin Erp, PA  loperamide (IMODIUM A-D) 2 MG tablet Take 2 mg by mouth 4 (four) times daily as needed for diarrhea or loose stools.  Yes [provider]  losartan (COZAAR) 50 MG tablet Take 50 mg by mouth daily.   Yes [provider]  metoprolol succinate (TOPROL-XL) 50 MG 24 hr tablet Take 50 mg by mouth daily. 08/03/20  Yes [provider]  Pediatric Multivitamins-Iron (FLINTSTONES COMPLETE) 18 MG CHEW Chew 1 tablet by mouth daily.    Yes [provider]  rosuvastatin (CRESTOR) 40 MG tablet TAKE 1 TABLET(40 MG) BY MOUTH AT BEDTIME Patient taking differently: Take by mouth daily. 05/10/20  Yes Park Liter, MD  triamcinolone cream (KENALOG) 0.1 % Apply 1 application topically daily as needed (spots on legs).    Yes [provider]  dicyclomine (BENTYL) 10 MG capsule Take 1 capsule (10 mg total) by mouth in the morning and at bedtime. Patient not taking: Reported on 08/31/2020 06/03/20   Levin Erp, PA  Insulin Pen Needle 32G X 4 MM MISC USE AS DIRECTED TWICE DAILY 06/26/18   [provider]    Inpatient  Medications: Scheduled Meds: . metoprolol succinate  50 mg Oral Daily   Continuous Infusions:  PRN Meds: nitroGLYCERIN  Allergies:    Allergies  Allergen Reactions  . Mercury Nausea And Vomiting    Social History:   Social History   Socioeconomic History  . Marital status: Married    Spouse name: Not on file  . Number of children: Not on file  . Years of education: Not on file  . Highest education level: Not on file  Occupational History  . Not on file  Tobacco Use  . Smoking status: Never Smoker  . Smokeless tobacco: Never Used  Vaping Use  . Vaping Use: Never used  Substance and Sexual Activity  . Alcohol use: No  . Drug use: Never  . Sexual activity: Not on file  Other Topics Concern  . Not on file  Social History Narrative  . Not on file   Social Determinants of Health   Financial Resource Strain: Not on file  Food Insecurity: Not on file  Transportation Needs: Not on file  Physical Activity: Not on file  Stress: Not on file  Social Connections: Not on file  Intimate Partner Violence: Not on file    Family History:   Family History  Problem Relation Age of Onset  . Breast cancer Mother   . Hypertension Mother   . Hypertension Father   . Pancreatic cancer Other        uncle    Family Status  Relation Name Status  . Mother  Deceased  . Father  Deceased  . Other  (Not Specified)     ROS:  Please see the history of present illness.  All other ROS reviewed and negative.     Physical Exam/Data:   Vitals:   08/31/20 0830 08/31/20 0845 08/31/20 0859 08/31/20 0900  BP: (!) 185/80 (!) 170/74  (!) 199/80  Pulse: 86 85 85 86  Resp: (!) 25 (!) 22 (!) 21 (!) 21  Temp:      TempSrc:      SpO2: (!) 84% (!) 89% 95% 95%  Weight:      Height:       No intake or output data in the 24 hours ending 08/31/20 1103 Last 3 Weights 08/31/2020 06/03/2020 05/18/2020  Weight (lbs) 159 lb 157 lb 9.6 oz 155 lb  Weight (kg) 72.122 kg 71.487 kg 70.308 kg      Body mass index is 29.08 kg/m.  General:  Well nourished,  well developed, in acute respiratory distress HEENT: normal Lymph: no adenopathy Neck: no JVD seen but patient is using accessory muscles to breathe and respiratory rate is elevated Endocrine:  No thryomegaly Vascular: No carotid bruits; 4/4 extremity pulses 2+, with bruits  Cardiac:  normal S1, S2; RRR; no murmur Lungs: Some rales bases bilaterally, no wheezing, rhonchi  Abd: soft, nontender, no hepatomegaly  Ext: no edema Musculoskeletal:  No deformities, BUE and BLE strength normal and equal Skin: warm and dry  Neuro:  CNs 2-12 intact, no focal abnormalities noted Psych:  Normal affect   EKG:  The EKG was personally reviewed and demonstrates:  SR, HR 85, LAFB is old Telemetry:  Telemetry was personally reviewed and demonstrates:  SR  Relevant CV Studies:  ECHO: ordered  ECHO: 11/11/2019 1. Low normal LV systolic function; grade 1 diastolic dysfunction;  moderate LVH; small pericardial effusion.  2. Left ventricular ejection fraction, by estimation, is 50 to 55%. The  left ventricle has low normal function. The left ventricle has no regional  wall motion abnormalities. There is moderate left ventricular hypertrophy.  Left ventricular diastolic  parameters are consistent with Grade I diastolic dysfunction (impaired  relaxation).  3. Right ventricular systolic function is normal. The right ventricular  size is normal.  4. The mitral valve is normal in structure. Trivial mitral valve  regurgitation. No evidence of mitral stenosis.  5. The aortic valve is tricuspid. Aortic valve regurgitation is not  visualized. Mild aortic valve sclerosis is present, with no evidence of  aortic valve stenosis.  6. The inferior vena cava is normal in size with greater than 50%  respiratory variability, suggesting right atrial pressure of 3 mmHg.  Renal US 09/24/2019  Summary:  Renal:    Right: Normal size right kidney.  Abnormal right Resistive Index.     Abnormal cortical thickness of right kidney.     1-59% stenosis of the right renal artery.     RRV flow present. Dilated ureter, measuring .88 cm.  Left: Normal size of left kidney. Abnormal left Resistive Index.     Abnormal cortical thickness of the left kidney.     1-59% stenosis of the left renal artery. LRV flow present.  Mesenteric:    The celiac and SMA were not visualized due to overlying bowel gas.    Patent IVC.    Technically challenging exam due to excessive bowel gas. Suggest alternate  imaging modality.   Laboratory Data:  High Sensitivity Troponin:   Recent Labs  Lab 08/31/20 0720 08/31/20 0934  TROPONINIHS 91* 95*     Chemistry Recent Labs  Lab 08/31/20 0720  NA 139  K 4.5  CL 104  CO2 26  GLUCOSE 164*  BUN 32*  CREATININE 1.38*  CALCIUM 9.3  GFRNONAA 42*  ANIONGAP 9    No results for input(s): PROT, ALBUMIN, AST, ALT, ALKPHOS, BILITOT in the last 168 hours.   Magnesium  Date Value Ref Range Status  06/29/2015 2.1 1.7 - 2.4 mg/dL Final    Hematology Recent Labs  Lab 08/31/20 0720  WBC 8.9  RBC 4.55  HGB 12.4  HCT 40.2  MCV 88.4  MCH 27.3  MCHC 30.8  RDW 14.4  PLT 250   BNP Recent Labs  Lab 08/31/20 0720  BNP 421.0*    DDimer  Recent Labs  Lab 08/31/20 0720  DDIMER 0.83*   Lab Results  Component Value Date   TSH 3.980 07/18/2019   Lab Results  Component Value Date  HGBA1C 10.9 (H) 06/29/2015   Lab Results  Component Value Date   CHOL 116 12/10/2019   HDL 52 12/10/2019   LDLCALC 46 12/10/2019   TRIG 94 12/10/2019   CHOLHDL 2.2 12/10/2019     Radiology/Studies:  DG Chest 2 View  Result Date: 08/31/2020 CLINICAL DATA:  70 year old female with new onset left chest pain and shortness of breath since last night. EXAM: CHEST - 2 VIEW COMPARISON:  Chest CT 01/10/2016 and earlier. FINDINGS: PA and lateral views. Patchy abnormal left lung base opacity appears  more related to the lingula than the lower lobe on the lateral view. No pleural effusion. No pneumothorax or pulmonary edema. Elsewhere lung markings are stable and within normal limits. Mediastinal contours remain normal. Visualized tracheal air column is within normal limits. Calcified aortic atherosclerosis. No acute osseous abnormality identified. Stable cholecystectomy clips. Negative visible bowel gas pattern. IMPRESSION: Multifocal patchy opacity at the left lung base appears primarily within the lingula. No pleural effusion. This is nonspecific. The patient seem to have had a left lung base pneumonia in 2017 which later resolved. Electronically Signed   By: Genevie Ann M.D.   On: 08/31/2020 07:45     Assessment and Plan:   1. Chest pain, elevated troponin - She has had chest pain all night long, if this is a primary cardiac pain, her next troponin should be significantly higher than the first - Check echo - Start heparin and IV nitroglycerin, the nitro will help with blood pressure control as well as chest pain. - she has coronary calcifications on CT, extensive PAD >> Cath in am  2.  Shortness of breath - She has some rales in her bases, but no JVD seen. - However, she has significant shortness of breath and is working hard to breathe. - Her D-dimer was elevated, VQ scan performed, results pending -  V/Q negative, f/u on echo results - Supplemental O2  3. HTN - BP very elevated, possibly from RAS - pta on  Amlodipine 10 mg qd and losartan 50 mg qd (held) - on home dose Toprol XL 50 mg qd - restart amlodipine, IV nitro should help w/ BP as well - ck renal US  4. PAD, RAS - PAD doing well on pletal, continue - L RAS 90% at [facility] cath, <60% on [place] - recheck RA [place], may need intervention on L RA   Risk Assessment/Risk Scores:           :[id number]   HEAR Score (for undifferentiated chest pain):  HEAR Score: 4{   For questions or updates, please contact [place] Please consult  [email or web address] for contact info under    Signed, Rosaria Ferries, PA-C  08/31/2020 11:03 AM

## 2020-08-31 NOTE — ED Notes (Signed)
Ultrasound at bedside

## 2020-08-31 NOTE — ED Provider Notes (Signed)
[facility] DEPT Provider Note   CSN: [id number] Arrival date & time: 08/31/20  [id number]     History Chief Complaint  Patient presents with  . Chest Pain    Molly [name] is a 70 y.o. female.   Chest Pain Pain location:  Substernal area Pain quality: tightness   Pain radiates to:  Does not radiate Pain severity:  Moderate Onset quality:  Gradual Timing:  Constant Progression:  Unchanged Chronicity:  New Context comment:  Did not take home HTN meds Relieved by:  Nitroglycerin Worsened by:  Nothing Associated symptoms: shortness of breath   Associated symptoms: no back pain, no cough, no fever, no headache, no nausea, no palpitations and no vomiting        Past Medical History:  Diagnosis Date  . Abdominal aortic atherosclerosis with stenosis 06/29/2015  . AKI (acute kidney injury) ([place]) 06/29/2015  . Atherosclerosis of coronary artery without angina pectoris 04/21/2020  . Atopic dermatitis 04/21/2020  . Bilateral impacted cerumen 08/21/2019  . Cholecystitis 06/29/2015  . Cholelithiasis 06/29/2015  . Claudication in peripheral vascular disease ([place]) 08/28/2019   Peripheral arterial disease  . Colon cancer screening 04/21/2020  . Dehydration with hyponatremia 06/29/2015  . Diabetes mellitus ([place]) 07/15/2018  . Diabetes mellitus without complication ([place])   . Diarrhea 04/21/2020  . DKA (diabetic ketoacidoses) 06/29/2015  . Dyslipidemia 08/30/2018  . Epigastric pain 04/21/2020  . Gastroesophageal reflux disease 04/21/2020  . HTN (hypertension) 06/29/2015  . Hyperglycemia due to type 2 diabetes mellitus ([place]) 04/21/2020  . Hypertension   . 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 Left-sided chest pain 04/21/2020  . Leukocytosis 06/29/2015  . Long term (current) use of insulin ([place]) 04/21/2020  .  Loss of appetite 04/21/2020  . Nausea 04/21/2020  . Occlusion and stenosis of bilateral carotid arteries 04/21/2020  . Osteopenia 10/02/2018  . Other dysphagia 09/10/2018  . Peripheral vascular disease ([place]) 07/15/2018   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  Noncritical  . 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 Proptosis 04/21/2020  . Proximal leg weakness 09/10/2018  . Ptosis of left eyelid 09/10/2018  . Renal artery stenosis ([place]) 12/17/2019   Renal artery stenosis  . Standard chest x-ray abnormal 04/21/2020  . Vitamin D deficiency 04/21/2020  . Weight loss 04/21/2020    Patient Active Problem List   Diagnosis Date Noted  . Chest pain 08/31/2020  . Accelerated hypertension 07/12/2020  . Type 2 diabetes mellitus with stage 3b chronic kidney disease, with long-term current use of insulin ([place]) 07/12/2020  . Hypertension   . Diabetes mellitus without complication ([place])   . Atherosclerosis of coronary artery without angina pectoris 04/21/2020  . Atopic dermatitis 04/21/2020  . Colon cancer screening 04/21/2020  . Diarrhea 04/21/2020  . Gastroesophageal reflux disease 04/21/2020  . Hyperglycemia due to type 2 diabetes mellitus ([place]) 04/21/2020  . 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 Left-sided chest pain 04/21/2020  . Long term (current) use of insulin ([place]) 04/21/2020  . Loss of appetite 04/21/2020  . Nausea 04/21/2020  . Occlusion and stenosis of bilateral carotid arteries 04/21/2020  . Proptosis 04/21/2020  . Chronic kidney disease 04/21/2020  . Epigastric pain 04/21/2020  . Standard chest x-ray abnormal 04/21/2020  .  Vitamin D deficiency 04/21/2020  . Weight loss 04/21/2020  . Renal artery stenosis ([place]) 12/17/2019  . Claudication in peripheral vascular disease ([place]) 08/28/2019  . Bilateral impacted cerumen 08/21/2019  .  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 Osteopenia 10/02/2018  . Other dysphagia 09/10/2018  . 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 Proximal leg weakness 09/10/2018  . Ptosis of left eyelid 09/10/2018  . Dyslipidemia 08/30/2018  . Diabetes mellitus ([place]) 07/15/2018  . Peripheral vascular disease ([place]) 07/15/2018  . DKA (diabetic ketoacidoses) 06/29/2015  . HTN (hypertension) 06/29/2015  . AKI (acute kidney injury) ([place]) 06/29/2015  . Cholelithiasis 06/29/2015  . Abdominal aortic atherosclerosis with stenosis 06/29/2015  . Dehydration with hyponatremia 06/29/2015  . Leukocytosis 06/29/2015  . Cholecystitis 06/29/2015    Past Surgical History:  Procedure Laterality Date  . ABDOMINAL AORTOGRAM W/LOWER EXTREMITY N/A 08/28/2019   Procedure: ABDOMINAL AORTOGRAM W/LOWER EXTREMITY;  Surgeon: Lorretta Harp, MD;  Location: [facility];  Service: Cardiovascular;  Laterality: N/A;  . CATARACT EXTRACTION, BILATERAL    . CHOLECYSTECTOMY N/A 06/30/2015   Procedure: LAPAROSCOPIC CHOLECYSTECTOMY WITH INTRAOPERATIVE CHOLANGIOGRAM;  Surgeon: Donnie Mesa, MD;  Location: [facility];  Service: General;  Laterality: N/A;  . PERIPHERAL VASCULAR BALLOON ANGIOPLASTY Right 08/28/2019   Procedure: PERIPHERAL VASCULAR BALLOON ANGIOPLASTY;  Surgeon: Lorretta Harp, MD;  Location: [facility];  Service: Cardiovascular;  Laterality: Right;  attempted SFA     OB History   No obstetric history on file.     Family History  Problem Relation Age of Onset  . Breast cancer Mother   . Hypertension Mother   . Hypertension Father   . Pancreatic cancer Other        uncle    Social History   Tobacco Use  . Smoking status: Never Smoker  . Smokeless tobacco: Never Used  Vaping Use  . Vaping Use: Never used  Substance Use Topics  . Alcohol use: No  . Drug use: Never    Home Medications Prior to Admission medications   Medication Sig Start Date End  Date Taking? Authorizing Provider  amLODipine (NORVASC) 10 MG tablet Take 1 tablet (10 mg total) by mouth at bedtime. 08/29/19  Yes Lorretta Harp, MD  aspirin EC 81 MG tablet Take 81 mg by mouth at bedtime.    Yes [provider]  Cholecalciferol 25 MCG (1000 UT) tablet Take 2,000 Units by mouth daily.   Yes [provider]  cilostazol (PLETAL) 50 MG tablet Take 1 tablet (50 mg total) by mouth 2 (two) times daily. 03/15/20  Yes Lorretta Harp, MD  diphenhydrAMINE (BENADRYL) 25 MG tablet Take 12.5 mg by mouth daily as needed for allergies or sleep.   Yes [provider]  famotidine (PEPCID) 20 MG tablet Take 20 mg by mouth 2 (two) times daily. 03/05/20  Yes [provider]  gabapentin (NEURONTIN) 100 MG capsule Take 100 mg by mouth 3 (three) times daily.   Yes [provider]  insulin isophane & regular human (HUMULIN 70/30 MIX) (70-30) 100 UNIT/ML KwikPen Inject 16-20 Units into the skin daily. 06/26/18  Yes [provider]  ketoconazole (NIZORAL) 2 % cream Apply 1 application topically daily as needed for irritation.  05/08/19  Yes [provider]  levocetirizine (XYZAL) 5 MG tablet Take 5 mg by mouth at bedtime.  05/12/19  Yes [provider]  lipase/protease/amylase (CREON) 36000 UNITS CPEP capsule Take 2 capsules (72,000 Units total)  by mouth with breakfast, with lunch, and with evening meal. Take 1 capsule with snacks. 07/05/20  Yes Levin Erp, PA  loperamide (IMODIUM A-D) 2 MG tablet Take 2 mg by mouth 4 (four) times daily as needed for diarrhea or loose stools.   Yes [provider]  losartan (COZAAR) 50 MG tablet Take 50 mg by mouth daily.   Yes [provider]  metoprolol succinate (TOPROL-XL) 50 MG 24 hr tablet Take 50 mg by mouth daily. 08/03/20  Yes [provider]  Pediatric Multivitamins-Iron (FLINTSTONES COMPLETE) 18 MG CHEW Chew 1 tablet by mouth daily.    Yes [provider]  rosuvastatin (CRESTOR) 40 MG tablet TAKE 1 TABLET(40 MG) BY MOUTH AT BEDTIME Patient taking differently: Take by mouth daily. 05/10/20  Yes Park Liter, MD  triamcinolone cream (KENALOG) 0.1 % Apply 1 application topically daily as needed (spots on legs).    Yes [provider]  dicyclomine (BENTYL) 10 MG capsule Take 1 capsule (10 mg total) by mouth in the morning and at bedtime. Patient not taking: Reported on 08/31/2020 06/03/20   Levin Erp, PA  Insulin Pen Needle 32G X 4 MM MISC USE AS DIRECTED TWICE DAILY 06/26/18   [provider]    Allergies    [place]  Review of Systems   Review of Systems  Constitutional: Negative for chills and fever.  HENT: Negative for congestion and rhinorrhea.   Respiratory: Positive for chest tightness and shortness of breath. Negative for cough.   Cardiovascular: Positive for chest pain. Negative for palpitations.  Gastrointestinal: Negative for diarrhea, nausea and vomiting.  Genitourinary: Negative for difficulty urinating and dysuria.  Musculoskeletal: Negative for arthralgias and back pain.  Skin: Negative for rash and wound.  Neurological: Negative for light-headedness and headaches.    Physical Exam Updated Vital Signs BP (!) 199/80   Pulse 86   Temp 98.9 F (37.2 C) (Oral)   Resp (!) 21   Ht '5\' 2"'$  (1.575 m)   Wt 72.1 kg   SpO2 95%   BMI 29.08 kg/m   Physical Exam Vitals and nursing note reviewed. Exam conducted with a chaperone present.  Constitutional:      General: She is not in acute distress.    Appearance: Normal appearance.  HENT:     Head: Normocephalic and atraumatic.     Nose: No rhinorrhea.  Eyes:     General:        Right eye: No discharge.        Left eye: No discharge.     Conjunctiva/sclera: Conjunctivae normal.  Cardiovascular:     Rate and Rhythm: Normal rate and regular rhythm.  Pulmonary:     Effort: Pulmonary effort is normal. No respiratory distress.      Breath sounds: No stridor. No wheezing, rhonchi or rales.  Abdominal:     General: Abdomen is flat. There is no distension.     Palpations: Abdomen is soft.  Musculoskeletal:        General: No tenderness or signs of injury.     Right lower leg: No edema.     Left lower leg: No edema.  Skin:    General: Skin is warm and dry.  Neurological:     General: No focal deficit present.     Mental Status: She is alert. Mental status is at baseline.     Motor: No weakness.  Psychiatric:        Mood and Affect: Mood  normal.        Behavior: Behavior normal.     ED Results / Procedures / Treatments   Labs (all labs ordered are listed, but only abnormal results are displayed) Labs Reviewed  BASIC METABOLIC PANEL - Abnormal; Notable for the following components:      Result Value   Glucose, Bld 164 (*)    BUN 32 (*)    Creatinine, Ser 1.38 (*)    GFR, Estimated 42 (*)    All other components within normal limits  BRAIN NATRIURETIC PEPTIDE - Abnormal; Notable for the following components:   B Natriuretic Peptide 421.0 (*)    All other components within normal limits  D-DIMER, QUANTITATIVE - Abnormal; Notable for the following components:   D-Dimer, Quant 0.83 (*)    All other components within normal limits  TROPONIN I (HIGH SENSITIVITY) - Abnormal; Notable for the following components:   Troponin I (High Sensitivity) 91 (*)    All other components within normal limits  SARS CORONAVIRUS 2 (TAT 6-24 HRS)  CBC  TROPONIN I (HIGH SENSITIVITY)    EKG None  Radiology DG Chest 2 View  Result Date: 08/31/2020 CLINICAL DATA:  70 year old female with new onset left chest pain and shortness of breath since last night. EXAM: CHEST - 2 VIEW COMPARISON:  Chest CT 01/10/2016 and earlier. FINDINGS: PA and lateral views. Patchy abnormal left lung base opacity appears more related to the lingula than the lower lobe on the lateral view. No pleural effusion. No pneumothorax or pulmonary edema.  Elsewhere lung markings are stable and within normal limits. Mediastinal contours remain normal. Visualized tracheal air column is within normal limits. Calcified aortic atherosclerosis. No acute osseous abnormality identified. Stable cholecystectomy clips. Negative visible bowel gas pattern. IMPRESSION: Multifocal patchy opacity at the left lung base appears primarily within the lingula. No pleural effusion. This is nonspecific. The patient seem to have had a left lung base pneumonia in 2017 which later resolved. Electronically Signed   By: Genevie Ann M.D.   On: 08/31/2020 07:45    Procedures Procedures   Medications Ordered in ED Medications  nitroGLYCERIN (NITROSTAT) SL tablet 0.4 mg (0.4 mg Sublingual Given 08/31/20 0809)  metoprolol succinate (TOPROL-XL) 24 hr tablet 50 mg (has no administration in time range)    ED Course  I have reviewed the triage vital signs and the nursing notes.  Pertinent labs & imaging results that were available during my care of the patient were reviewed by me and considered in my medical decision making (see chart for details).    MDM Rules/Calculators/A&P                         History of hypertension, did not take meds last night.  Likely the source of her hypertension today.  Which is likely the source of her symptoms.  Nitroglycerin relieves symptoms significantly and brought blood pressure down.  Offered giving home meds to start bringing blood pressure back down but patient declines as she knows there is significant cost with taking the medication in the emergency room rather than take them when she gets home.  We had shared decision-making about ruling out endorgan dysfunction and then getting her home to take her hypertension medications.  Her chest x-ray showed some patchy infiltrate in the lingula.  She has had some mild cough for over a week.  Offered viral testing but declined.  I do not think this is unreasonable as the management  plan would be  unchanged.  Patient agrees to the plan and we will further evaluate for organ dysfunction.  Elevated D-dimer elevated troponin otherwise fairly unremarkable.  Patient will require admission now for blood pressure control further evaluation of the elevated troponin whether it be coronary artery syndrome versus hypertensive emergency.  Aspirin was taken prior to arrival.  Medicine service is consulted and agrees to admit for further evaluation and they ask for cardiology consult.  I have paged them for consult.  Patient understands this and will be admitted viral screen will be obtained.  Final Clinical Impression(s) / ED Diagnoses Final diagnoses:  Hypertensive emergency  Elevated troponin  SOB (shortness of breath)  Chest pain, unspecified type    Rx / DC Orders ED Discharge Orders    None       Breck Coons, MD 08/31/20 [phone]

## 2020-09-01 ENCOUNTER — Other Ambulatory Visit: Payer: Self-pay

## 2020-09-01 DIAGNOSIS — R079 Chest pain, unspecified: Secondary | ICD-10-CM | POA: Diagnosis not present

## 2020-09-01 DIAGNOSIS — I214 Non-ST elevation (NSTEMI) myocardial infarction: Secondary | ICD-10-CM | POA: Diagnosis not present

## 2020-09-01 LAB — CBC
HCT: 35.1 % — ABNORMAL LOW (ref 36.0–46.0)
Hemoglobin: 10.7 g/dL — ABNORMAL LOW (ref 12.0–15.0)
MCH: 27.2 pg (ref 26.0–34.0)
MCHC: 30.5 g/dL (ref 30.0–36.0)
MCV: 89.3 fL (ref 80.0–100.0)
Platelets: 234 10*3/uL (ref 150–400)
RBC: 3.93 MIL/uL (ref 3.87–5.11)
RDW: 14.2 % (ref 11.5–15.5)
WBC: 8.5 10*3/uL (ref 4.0–10.5)
nRBC: 0 % (ref 0.0–0.2)

## 2020-09-01 LAB — BASIC METABOLIC PANEL
Anion gap: 8 (ref 5–15)
Anion gap: 9 (ref 5–15)
BUN: 32 mg/dL — ABNORMAL HIGH (ref 8–23)
BUN: 38 mg/dL — ABNORMAL HIGH (ref 8–23)
CO2: 26 mmol/L (ref 22–32)
CO2: 23 mmol/L (ref 22–32)
Calcium: 8.6 mg/dL — ABNORMAL LOW (ref 8.9–10.3)
Calcium: 8.2 mg/dL — ABNORMAL LOW (ref 8.9–10.3)
Chloride: 105 mmol/L (ref 98–111)
Chloride: 106 mmol/L (ref 98–111)
Creatinine, Ser: 1.71 mg/dL — ABNORMAL HIGH (ref 0.44–1.00)
Creatinine, Ser: 1.88 mg/dL — ABNORMAL HIGH (ref 0.44–1.00)
GFR, Estimated: 32 mL/min — ABNORMAL LOW (ref >60)
GFR, Estimated: 29 mL/min — ABNORMAL LOW (ref >60)
Glucose, Bld: 170 mg/dL — ABNORMAL HIGH (ref 70–99)
Glucose, Bld: 149 mg/dL — ABNORMAL HIGH (ref 70–99)
Potassium: 4.4 mmol/L (ref 3.5–5.1)
Potassium: 4.3 mmol/L (ref 3.5–5.1)
Sodium: 139 mmol/L (ref 135–145)
Sodium: 138 mmol/L (ref 135–145)

## 2020-09-01 LAB — GLUCOSE, CAPILLARY
Glucose-Capillary: 138 mg/dL — ABNORMAL HIGH (ref 70–99)
Glucose-Capillary: 113 mg/dL — ABNORMAL HIGH (ref 70–99)
Glucose-Capillary: 148 mg/dL — ABNORMAL HIGH (ref 70–99)
Glucose-Capillary: 197 mg/dL — ABNORMAL HIGH (ref 70–99)

## 2020-09-01 LAB — HIV ANTIBODY (ROUTINE TESTING W REFLEX): HIV Screen 4th Generation wRfx: NONREACTIVE

## 2020-09-01 LAB — HEPARIN LEVEL (UNFRACTIONATED): Heparin Unfractionated: 0.59 IU/mL (ref 0.30–0.70)

## 2020-09-01 MED ORDER — SODIUM CHLORIDE 0.9% FLUSH: 3.0000 mL | INTRAVENOUS | Status: DC | PRN

## 2020-09-01 MED ORDER — EZETIMIBE 10 MG PO TABS
10.0000 mg | ORAL_TABLET | Freq: Every day | ORAL | Status: DC
  Administered 2020-09-01 – 2020-09-13 (×12): 10 mg via ORAL
  Filled 2020-09-01 (×13): qty 1

## 2020-09-01 MED ORDER — SODIUM CHLORIDE 0.9 % IV SOLN: INTRAVENOUS | Status: DC

## 2020-09-01 MED ORDER — ASPIRIN 81 MG PO CHEW: 81.0000 mg | CHEWABLE_TABLET | ORAL | Status: DC

## 2020-09-01 MED ORDER — SODIUM CHLORIDE 0.9 % IV SOLN: 250.0000 mL | INTRAVENOUS | Status: DC | PRN

## 2020-09-01 MED ORDER — SODIUM CHLORIDE 0.9% FLUSH
3.0000 mL | Freq: Two times a day (BID) | INTRAVENOUS | Status: DC
  Administered 2020-09-01 – 2020-09-12 (×22): 3 mL via INTRAVENOUS

## 2020-09-01 MED ORDER — ASPIRIN 81 MG PO CHEW
81.0000 mg | CHEWABLE_TABLET | ORAL | Status: AC
  Administered 2020-09-01: 81 mg via ORAL
  Filled 2020-09-01: qty 1

## 2020-09-01 MED ORDER — CARVEDILOL 12.5 MG PO TABS
25.0000 mg | ORAL_TABLET | Freq: Two times a day (BID) | ORAL | Status: DC
  Administered 2020-09-01 – 2020-09-03 (×4): 25 mg via ORAL
  Filled 2020-09-01 (×5): qty 2

## 2020-09-01 MED ORDER — ISOSORBIDE MONONITRATE ER 60 MG PO TB24
60.0000 mg | ORAL_TABLET | Freq: Every day | ORAL | Status: DC
  Administered 2020-09-01: 60 mg via ORAL
  Filled 2020-09-01: qty 1

## 2020-09-01 NOTE — Progress Notes (Signed)
Progress Note  Patient Name: Molly [name] Date of Encounter: 09/01/2020  [facility] Cardiologist: Jenne Campus, MD   Subjective   No recurrent chest pain overnight. Breathing improved from yesterday, now off O2. No new issues overnight. We discussed reason for delaying heart catheterization today. All questions answered.   Inpatient Medications    Scheduled Meds: . amLODipine  10 mg Oral QHS  . aspirin  81 mg Oral Daily  . cetirizine  5 mg Oral QHS  . Chlorhexidine Gluconate Cloth  6 each Topical Daily  . cholecalciferol  2,000 Units Oral Daily  . cilostazol  50 mg Oral BID  . famotidine  20 mg Oral Daily  . gabapentin  100 mg Oral TID  . insulin aspart  0-5 Units Subcutaneous QHS  . insulin aspart  0-9 Units Subcutaneous TID WC  . lipase/protease/amylase  72,000 Units Oral TID with meals  . losartan  50 mg Oral Daily  . mouth rinse  15 mL Mouth Rinse BID  . metoprolol succinate  50 mg Oral Daily  . rosuvastatin  40 mg Oral Daily  . sodium chloride flush  3 mL Intravenous Q12H   Continuous Infusions: . sodium chloride    . sodium chloride 50 mL/hr at 09/01/20 0616  . heparin 800 Units/hr (08/31/20 2300)   PRN Meds: sodium chloride, acetaminophen, ALPRAZolam, diphenhydrAMINE, loperamide, nitroGLYCERIN, ondansetron (ZOFRAN) IV, sodium chloride flush   Vital Signs    Vitals:   09/01/20 0500 09/01/20 0600 09/01/20 0700 09/01/20 0740  BP: (!) 108/43 (!) 93/36 (!) 99/37   Pulse: 79 74 72   Resp: (!) 23 (!) 21 20   Temp:    (!) 97.5 F (36.4 C)  TempSrc:    Oral  SpO2: 99% 98% 98%   Weight:      Height:        Intake/Output Summary (Last 24 hours) at 09/01/2020 0844 Last data filed at 08/31/2020 2300 Gross per 24 hour  Intake 368.38 ml  Output 420 ml  Net -51.62 ml   Last 3 Weights 08/31/2020 06/03/2020 05/18/2020  Weight (lbs) 159 lb 157 lb 9.6 oz 155 lb  Weight (kg) 72.122 kg 71.487 kg 70.308 kg      Telemetry    Sinus rhythm - Personally  Reviewed  ECG    No new tracings - Personally Reviewed  Physical Exam   GEN: No acute distress.   Neck: No JVD Cardiac: RRR, no murmurs, rubs, or gallops.  Respiratory: Clear to auscultation bilaterally. GI: Soft, nontender, non-distended  MS: No edema; No deformity. Neuro:  Nonfocal  Psych: Normal affect   Labs    High Sensitivity Troponin:   Recent Labs  Lab 08/31/20 0720 08/31/20 0934  TROPONINIHS 91* 95*      Chemistry Recent Labs  Lab 08/31/20 0720 09/01/20 0042  NA 139 139  K 4.5 4.4  CL 104 105  CO2 26 26  GLUCOSE 164* 170*  BUN 32* 32*  CREATININE 1.38* 1.71*  CALCIUM 9.3 8.6*  GFRNONAA 42* 32*  ANIONGAP 9 8     Hematology Recent Labs  Lab 08/31/20 0720 09/01/20 0042  WBC 8.9 8.5  RBC 4.55 3.93  HGB 12.4 10.7*  HCT 40.2 35.1*  MCV 88.4 89.3  MCH 27.3 27.2  MCHC 30.8 30.5  RDW 14.4 14.2  PLT 250 234    BNP Recent Labs  Lab 08/31/20 0720  BNP 421.0*     DDimer  Recent Labs  Lab 08/31/20 0720  DDIMER 0.83*     Radiology    DG Chest 2 View  Result Date: 08/31/2020 CLINICAL DATA:  70 year old female with new onset left chest pain and shortness of breath since last night. EXAM: CHEST - 2 VIEW COMPARISON:  Chest CT 01/10/2016 and earlier. FINDINGS: PA and lateral views. Patchy abnormal left lung base opacity appears more related to the lingula than the lower lobe on the lateral view. No pleural effusion. No pneumothorax or pulmonary edema. Elsewhere lung markings are stable and within normal limits. Mediastinal contours remain normal. Visualized tracheal air column is within normal limits. Calcified aortic atherosclerosis. No acute osseous abnormality identified. Stable cholecystectomy clips. Negative visible bowel gas pattern. IMPRESSION: Multifocal patchy opacity at the left lung base appears primarily within the lingula. No pleural effusion. This is nonspecific. The patient seem to have had a left lung base pneumonia in 2017 which  later resolved. Electronically Signed   By: Genevie Ann M.D.   On: 08/31/2020 07:45   CT CHEST WO CONTRAST  Result Date: 08/31/2020 CLINICAL DATA:  Chest pain. EXAM: CT CHEST WITHOUT CONTRAST TECHNIQUE: Multidetector CT imaging of the chest was performed following the standard protocol without IV contrast. COMPARISON:  January 10, 2016. FINDINGS: Cardiovascular: Atherosclerosis of thoracic aorta is noted without aneurysm formation. Normal cardiac size. Coronary artery calcifications are noted. No pericardial effusion is noted. Mediastinum/Nodes: No enlarged mediastinal or axillary lymph nodes. Thyroid gland, trachea, and esophagus demonstrate no significant findings. Lungs/Pleura: No pneumothorax or pleural effusion is noted. Left basilar and lingular opacities are noted concerning for atelectasis or pneumonia. Upper Abdomen: No acute abnormality. Musculoskeletal: No chest wall mass or suspicious bone lesions identified. IMPRESSION: Left basilar and lingular opacities are noted concerning for atelectasis or pneumonia. Coronary artery calcifications are noted. Aortic Atherosclerosis (ICD10-I70.0). Electronically Signed   By: Marijo Conception M.D.   On: 08/31/2020 12:30   NM Pulmonary Perfusion  Result Date: 08/31/2020 CLINICAL DATA:  70 year old female with new onset left chest pain and patchy left lung base opacity on chest radiographs today. EXAM: NUCLEAR MEDICINE PERFUSION LUNG SCAN TECHNIQUE: Perfusion images were obtained in multiple projections after intravenous injection of radiopharmaceutical. Ventilation scans intentionally deferred if perfusion scan and chest x-ray adequate for interpretation during COVID 19 epidemic. RADIOPHARMACEUTICALS:  4.1 mCi Tc-90mMAA IV COMPARISON:  Chest radiographs 0713 hours today. FINDINGS: Symmetric and homogeneous perfusion radiotracer activity is noted in both lungs. No perfusion defect is identified. IMPRESSION: Normal lung perfusion imaging.  No evidence of pulmonary  embolus. Electronically Signed   By: HGenevie AnnM.D.   On: 08/31/2020 10:34   ECHOCARDIOGRAM COMPLETE  Result Date: 08/31/2020    ECHOCARDIOGRAM REPORT   Patient Name:   Molly BUNDADate of Exam: 08/31/2020 Medical Rec #:  [id number]   Height:       62.0 in Accession #:    [id number]  Weight:       159.0 lb Date of Birth:  04/09/1951/04/30   BSA:          1.734 m Patient Age:    89years     BP:           190/77 mmHg Patient Gender: F            HR:           83 bpm. Exam Location:  Inpatient Procedure: 2D Echo, Cardiac Doppler and Color Doppler Indications:    R07.9* Chest pain, unspecified  History:  Patient has prior history of Echocardiogram examinations, most                 recent 11/11/2019. Risk Factors:Hypertension, Diabetes,                 Dyslipidemia and GERD.  Sonographer:    Tiffany Dance Referring Phys: [id number] Grubbs  1. Left ventricular ejection fraction, by estimation, is 50 to 55%. Left ventricular ejection fraction by 3D volume is 51 %. The left ventricle has low normal function. The left ventricle has no regional wall motion abnormalities. There is moderate concentric left ventricular hypertrophy. Left ventricular diastolic parameters are consistent with Grade I diastolic dysfunction (impaired relaxation).  2. Right ventricular systolic function is normal. The right ventricular size is normal.  3. The pericardial effusion is circumferential. There is no evidence of cardiac tamponade.  4. The mitral valve is grossly normal. Trivial mitral valve regurgitation. No evidence of mitral stenosis.  5. The aortic valve is tricuspid. There is mild calcification of the aortic valve. There is mild thickening of the aortic valve. Aortic valve regurgitation is not visualized. No aortic stenosis is present.  6. The inferior vena cava is normal in size with greater than 50% respiratory variability, suggesting right atrial pressure of 3 mmHg. Comparison(s): A prior study was performed on  11/11/2019. No significant change from prior study. Prior images reviewed side by side. FINDINGS  Left Ventricle: Left ventricular ejection fraction, by estimation, is 50 to 55%. Left ventricular ejection fraction by 3D volume is 51 %. The left ventricle has low normal function. The left ventricle has no regional wall motion abnormalities. The left ventricular internal cavity size was normal in size. There is moderate concentric left ventricular hypertrophy. Left ventricular diastolic parameters are consistent with Grade I diastolic dysfunction (impaired relaxation). Right Ventricle: The right ventricular size is normal. No increase in right ventricular wall thickness. Right ventricular systolic function is normal. Left Atrium: Left atrial size was normal in size. Right Atrium: Right atrial size was normal in size. Pericardium: Trivial pericardial effusion is present. The pericardial effusion is circumferential. There is no evidence of cardiac tamponade. Mitral Valve: The mitral valve is grossly normal. Mild mitral annular calcification. Trivial mitral valve regurgitation. No evidence of mitral valve stenosis. Tricuspid Valve: The tricuspid valve is grossly normal. Tricuspid valve regurgitation is not demonstrated. No evidence of tricuspid stenosis. Aortic Valve: The aortic valve is tricuspid. There is mild calcification of the aortic valve. There is mild thickening of the aortic valve. Aortic valve regurgitation is not visualized. No aortic stenosis is present. Pulmonic Valve: The pulmonic valve was grossly normal. Pulmonic valve regurgitation is not visualized. No evidence of pulmonic stenosis. Aorta: The aortic root and ascending aorta are structurally normal, with no evidence of dilitation. Venous: The inferior vena cava is normal in size with greater than 50% respiratory variability, suggesting right atrial pressure of 3 mmHg. IAS/Shunts: The atrial septum is grossly normal.  LEFT VENTRICLE PLAX 2D LVIDd:          4.60 cm         Diastology LVIDs:         2.80 cm         LV e' lateral:   5.98 cm/s LV PW:         1.30 cm         LV E/e' lateral: 19.4 LV IVS:        1.00 cm LVOT diam:  1.80 cm LV SV:         45              3D Volume EF LV SV Index:   26              LV 3D EF:    Left LVOT Area:     2.54 cm                     ventricular                                             ejection                                             fraction by                                             3D volume                                             is 51 %.                                 3D Volume EF:                                3D EF:        51 % RIGHT VENTRICLE             IVC RV Basal diam:  3.00 cm     IVC diam: 1.80 cm RV Mid diam:    2.70 cm RV S prime:     16.20 cm/s TAPSE (M-mode): 1.8 cm LEFT ATRIUM             Index       RIGHT ATRIUM           Index LA diam:        4.70 cm 2.71 cm/m  RA Area:     16.10 cm LA Vol (A2C):   60.7 ml 35.00 ml/m RA Volume:   36.30 ml  20.93 ml/m LA Vol (A4C):   54.0 ml 31.14 ml/m LA Biplane Vol: 58.6 ml 33.79 ml/m  AORTIC VALVE LVOT Vmax:   75.40 cm/s LVOT Vmean:  57.900 cm/s LVOT VTI:    0.177 m  AORTA Ao Root diam: 3.30 cm Ao Asc diam:  3.30 cm MITRAL VALVE MV Area (PHT): 4.46 cm     SHUNTS MV Decel Time: 170 msec     Systemic VTI:  0.18 m MV E velocity: 116.00 cm/s  Systemic Diam: 1.80 cm MV A velocity: 108.00 cm/s MV E/A ratio:  1.07 Rudean Haskell MD Electronically signed by Rudean Haskell MD Signature Date/Time: 08/31/2020/5:09:29 PM    Final    VAS US RENAL ARTERY DUPLEX  Result Date: 08/31/2020 ABDOMINAL VISCERAL Patient Name:  [facility]  Timmothy Sours  Date of Exam:   08/31/2020 Medical Rec #: [id number]     Accession #:    [id number] Date of Birth: 1950-11-05     Patient Gender: F Patient Age:   69Y Exam Location:  [facility] Procedure:      VAS US RENAL ARTERY DUPLEX Referring Phys: [id number] RHONDA G BARRETT  -------------------------------------------------------------------------------- High Risk Factors: Hypertension. Limitations: Air/bowel gas and patient positioning, patient unable to lie flat, rib shadow. Comparison Study: No prior studies. Performing Technologist: Oliver Hum RVT  Examination Guidelines: A complete evaluation includes B-mode imaging, spectral Doppler, color Doppler, and power Doppler as needed of all accessible portions of each vessel. Bilateral testing is considered an integral part of a complete examination. Limited examinations for reoccurring indications may be performed as noted.  Duplex Findings: +--------------------+--------+--------+------+------------------+ Mesenteric          PSV cm/sEDV cm/sPlaque     Comments      +--------------------+--------+--------+------+------------------+ Aorta Mid                                 Unable to insonate +--------------------+--------+--------+------+------------------+ Celiac Artery Origin                      Unable to insonate +--------------------+--------+--------+------+------------------+ SMA Origin                                Unable to insonate +--------------------+--------+--------+------+------------------+    +------------------+--------+--------+-------+ Right Renal ArteryPSV cm/sEDV cm/sComment +------------------+--------+--------+-------+ Origin               88      9            +------------------+--------+--------+-------+ Proximal             75      11           +------------------+--------+--------+-------+ Mid                  64      14           +------------------+--------+--------+-------+ Distal               30      7            +------------------+--------+--------+-------+ +-----------------+--------+--------+------------------+ Left Renal ArteryPSV cm/sEDV cm/s     Comment       +-----------------+--------+--------+------------------+ Origin                            Unable to insonate +-----------------+--------+--------+------------------+ Proximal                         Unable to insonate +-----------------+--------+--------+------------------+ Mid                              Unable to insonate +-----------------+--------+--------+------------------+ Distal                           Unable to insonate +-----------------+--------+--------+------------------+  Technologist observations: Unable to insonate the left kidney due to rib shadow and positioning. Unable to insonate the aorta due to overlying bowel gas. +------------+--------+--------+----+-----------+--------+--------+---+ Right KidneyPSV cm/sEDV cm/sRI  Left KidneyPSV cm/sEDV cm/sRI  +------------+--------+--------+----+-----------+--------+--------+---+ Upper Pole  30  7       0.77Upper Pole                     +------------+--------+--------+----+-----------+--------+--------+---+ Mid         30      7       0.75Mid                            +------------+--------+--------+----+-----------+--------+--------+---+ Lower Pole  36      12      0.67Lower Pole                     +------------+--------+--------+----+-----------+--------+--------+---+ Hilar       35      7       0.79Hilar                          +------------+--------+--------+----+-----------+--------+--------+---+ +------------------+-----+------------------++ Right Kidney           Left Kidney        +------------------+-----+------------------++ RAR                    RAR                +------------------+-----+------------------++ RAR (manual)           RAR (manual)       +------------------+-----+------------------++ Cortex                 Cortex             +------------------+-----+------------------++ Cortex thickness       Corex thickness    +------------------+-----+------------------++ Kidney length (cm)11.00Kidney length (cm)  +------------------+-----+------------------++  Summary: Renal:  Right: Normal size right kidney.  *See table(s) above for measurements and observations.  Diagnosing physician: Deitra Mayo MD  Electronically signed by Deitra Mayo MD on 08/31/2020 at 3:47:31 PM.    Final     Cardiac Studies   Echocardiogram 08/31/20: 1. Left ventricular ejection fraction, by estimation, is 50 to 55%. Left  ventricular ejection fraction by 3D volume is 51 %. The left ventricle has  low normal function. The left ventricle has no regional wall motion  abnormalities. There is moderate  concentric left ventricular hypertrophy. Left ventricular diastolic  parameters are consistent with Grade I diastolic dysfunction (impaired  relaxation).  2. Right ventricular systolic function is normal. The right ventricular  size is normal.  3. The pericardial effusion is circumferential. There is no evidence of  cardiac tamponade.  4. The mitral valve is grossly normal. Trivial mitral valve  regurgitation. No evidence of mitral stenosis.  5. The aortic valve is tricuspid. There is mild calcification of the  aortic valve. There is mild thickening of the aortic valve. Aortic valve  regurgitation is not visualized. No aortic stenosis is present.  6. The inferior vena cava is normal in size with greater than 50%  respiratory variability, suggesting right atrial pressure of 3 mmHg.   Comparison(s): A prior study was performed on 11/11/2019. No significant  change from prior study. Prior images reviewed side by side.   Abdominal aortogram 08/28/2019  -90% proximal left renal artery stenosis  -70% proximal left SFA, 100% mid left SFA  -100% left popliteal occlusion  -100% mid right SFA   Patient Profile     70 y.o. female with a PMH of PAD (CTO of left SFA and popliteal artery 08/2019), Left renal artery  stenosis, HTN, HLD, DM type 2, and CKD stage 3, who is being followed by cardiology for chest pain and  elevated troponins.   Assessment & Plan    1. Chest pain and elevated troponin in patient with coronary artery calcifications: patient presented with chest pain which woke her from sleep 08/31/20. BP was markedly elevated on arrival. EKG showed no ischemic changes. HsTrop was elevated to 91>95 - trend not c/w ACS. BNP was elevated to 421. CXR suggested possible PNA, with CT suggesting atelectasis vs PNA. She remained afebrile, no leukocytosis, and procal was negative suggesting no acute infections. She was started on a nitro gtt with resolution of chest pain, now off. Echo showed EF 50-55%, no RWMA, moderate concentric LVH, trivial pericardial effusion, and no significant valvular abnormalities. It was felt that her chest pain was likely demand ischemia in the setting of hypertensive crisis, though suspicions are quite high for CAD given extensive coronary artery calcifications on CT, as well as history of PAD, poorly controlled HTN, HLD, and DM type 2. She was recommended to undergo a LHC, planned for today, however Cr bumped to 1.71 from 1.38 yesterday.  - Will plan for [facility] tomorrow if Cr returns to baseline - NPO after MN - Continue aspirin and statin - Can continue heparin gtt for now - Continue BBlocker  2. Hypertensive crisis: BP markedly elevated to 200a/90s on admission. Likely contributed to #1. Home medications were restarted yesterday, including losartan. She has known left renal artery stenosis, though this was not felt to have contributed to her possible flash pulmonary edema. Repeat renal dopplers ordered. BP is quite low, comparatively, this morning if accurate.  - Will hold home losartan - Can continue metoprolol and amlodipine with hold parameters (hold for SBP <100)  3. PAD: no claudication complaints. - continue aspirin and statin  4. HLD: LDL 107 this admission on crestor '40mg'$  daily; goal >70 - Continue crestor - May benefit from referral to lipid clinic as it is unlikely she  will reach goal with addition of zetia.   5. DM type 2: A1C 7.0 this admission  - Continue ISS per primary team - Could consider addition of SGLT-2 inhibitor.       For questions or updates, please contact [place] Please consult [email or web address] for contact info under        Signed, Abigail Butts, PA-C  09/01/2020, 8:44 AM

## 2020-09-01 NOTE — TOC Initial Note (Signed)
Transition of Care [facility]) - Initial/Assessment Note    Patient Details  Name: Molly [name] MRN: [id number] Date of Birth: 1951/03/05  Transition of Care [facility]) CM/SW Contact:    [name], RN Phone Number: 09/01/2020, 8:01 AM  Clinical Narrative:                  70 y.o. female with medical history significant of DM2, HTN, CKD3b, PAD. Presenting with chest pain. She had left-sided, non-radiating, dull chest pain starting at 2am this morning. It was 6/10 and intermittent. She had some dyspnea at the time as well. She thought it was indigestion, and she had a little nausea. So, she tried some ginger ale. This did not help. She then had a severe headache. She checked her BP and found that it was 205/91. She took a '325mg'$  ASA and came to the ED. She denies any other aggravating or alleviating factors.   ED Course: EKG was w/o st elevations. Initial trp was elevated at 91. CP improved with NTG. She was found to be hypertensive. She was found to have an elevated d-dimer and BNP. TRH was called for admission.   Review of Systems:  Denies palpitations, lightheadedness, dizziness, syncopal episodes, peripheral swelling, V/D, fevers, sick contacts.  Review of systems is otherwise negative for all not mentioned in HPI.  PLAN: to return to home with husband is her support, will follow for progression.  Expected Discharge Plan: Home/Self Care Barriers to Discharge: No Barriers Identified   Patient Goals and CMS Choice Patient states their goals for this hospitalization and ongoing recovery are:: to go home      Expected Discharge Plan and Services Expected Discharge Plan: Home/Self Care   Discharge Planning Services: CM Consult   Living arrangements for the past 2 months: Single Family Home                                      Prior Living Arrangements/Services Living arrangements for the past 2 months: Single Family Home Lives with:: Spouse Patient language and need for  interpreter reviewed:: Yes Do you feel safe going back to the place where you live?: Yes      Need for Family Participation in Patient Care: Yes (Comment) Care giver support system in place?: Yes (comment)   Criminal Activity/Legal Involvement Pertinent to Current Situation/Hospitalization: No - Comment as needed  Activities of Daily Living Home Assistive Devices/Equipment: CBG Meter,Eyeglasses ADL Screening (condition at time of admission) Patient's cognitive ability adequate to safely complete daily activities?: Yes Is the patient deaf or have difficulty hearing?: No Does the patient have difficulty seeing, even when wearing glasses/contacts?: No Does the patient have difficulty concentrating, remembering, or making decisions?: No Patient able to express need for assistance with ADLs?: Yes Does the patient have difficulty dressing or bathing?: No Independently performs ADLs?: Yes (appropriate for developmental age) Does the patient have difficulty walking or climbing stairs?: No Weakness of Legs: None Weakness of Arms/Hands: None  Permission Sought/Granted                  Emotional Assessment Appearance:: Appears stated age Attitude/Demeanor/Rapport: Engaged Affect (typically observed): Calm Orientation: : Oriented to Place,Oriented to Self,Oriented to  Time,Oriented to Situation Alcohol / Substance Use: Not Applicable Psych Involvement: No (comment)  Admission diagnosis:  SOB (shortness of breath) [R06.02] RAS (renal artery stenosis) (HCC) [I70.1] Elevated troponin [R77.8] Hypertensive emergency [  I16.1] Chest pain [R07.9] Chest pain, unspecified type [R07.9] Patient Active Problem List   Diagnosis Date Noted  . Chest pain 08/31/2020  . Accelerated hypertension 07/12/2020  . Type 2 diabetes mellitus with stage 3b chronic kidney disease, with long-term current use of insulin ([place]) 07/12/2020  . Hypertension   . Diabetes mellitus without complication ([place])   .  Atherosclerosis of coronary artery without angina pectoris 04/21/2020  . Atopic dermatitis 04/21/2020  . Colon cancer screening 04/21/2020  . Diarrhea 04/21/2020  . Gastroesophageal reflux disease 04/21/2020  . Hyperglycemia due to type 2 diabetes mellitus ([place]) 04/21/2020  . 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 Left-sided chest pain 04/21/2020  . Long term (current) use of insulin ([place]) 04/21/2020  . Loss of appetite 04/21/2020  . Nausea 04/21/2020  . Occlusion and stenosis of bilateral carotid arteries 04/21/2020  . Proptosis 04/21/2020  . Chronic kidney disease 04/21/2020  . Epigastric pain 04/21/2020  . Standard chest x-ray abnormal 04/21/2020  . Vitamin D deficiency 04/21/2020  . Weight loss 04/21/2020  . Renal artery stenosis ([place]) 12/17/2019  . Claudication in peripheral vascular disease ([place]) 08/28/2019  . Bilateral impacted cerumen 08/21/2019  . 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 Osteopenia 10/02/2018  . Other dysphagia 09/10/2018  . 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 Proximal leg weakness 09/10/2018  . Ptosis of left eyelid 09/10/2018  . Dyslipidemia 08/30/2018  . Diabetes mellitus ([place]) 07/15/2018  . Peripheral vascular disease ([place]) 07/15/2018  . DKA (diabetic ketoacidoses) 06/29/2015  . HTN (hypertension) 06/29/2015  . AKI (acute kidney injury) ([place]) 06/29/2015  . Cholelithiasis 06/29/2015  . Abdominal aortic atherosclerosis with stenosis 06/29/2015  . Dehydration with hyponatremia 06/29/2015  . Leukocytosis 06/29/2015  . Cholecystitis 06/29/2015   PCP:  [name], MD Pharmacy:   [facility] [place] [address] Phone: [phone] Fax: [phone]  [facility]  Pharmacy - [place], [place] - [address] [place] [address] Phone: [phone] Fax: [phone]     Social Determinants of Health (SDOH) Interventions    Readmission Risk Interventions No flowsheet data found.

## 2020-09-01 NOTE — Progress Notes (Addendum)
ANTICOAGULATION CONSULT NOTE - Follow Up Consult  Pharmacy Consult for IV heparin Indication: chest pain/ACS  Allergies  Allergen Reactions  . Mercury Nausea And Vomiting    Patient Measurements: Height: '5\' 2"'$  (157.5 cm) Weight: 72.1 kg (159 lb) IBW/kg (Calculated) : 50.1 Heparin Dosing Weight: 66 kg  Vital Signs: Temp: 98.5 F (36.9 C) (05/17 2100) Temp Source: Oral (05/17 2100) BP: 161/49 (05/17 1830) Pulse Rate: 69 (05/17 1800)  Labs: Recent Labs    08/31/20 0720 08/31/20 0934 08/31/20 1823 09/01/20 0042  HGB 12.4  --   --  10.7*  HCT 40.2  --   --  35.1*  PLT 250  --   --  234  HEPARINUNFRC  --   --  0.65 0.59  CREATININE 1.38*  --   --   --   TROPONINIHS 91* 95*  --   --     Estimated Creatinine Clearance: 36.8 mL/min (A) (by C-G formula based on SCr of 1.38 mg/dL (H)).  Assessment: 78 yoF with PMH DM2, HTN, CKD3, PAD, presents with chest pain and dyspnea. High sens trop and D-dimer minimally elevated, but chest pain significantly improved with NTG and Pharmacy consulted for IV heparin for ACS r/o. Patient to also undergo VQ scan to r/o PE  Today, 09/01/2020:  Confirmatory heparin level=0.59  CBC:Hgb down to 10.7, Plts WNL  SCr slightly elevated; appears to be at baseline for CKD  No bleeding or infusion issues per RN  Goal of Therapy: Heparin level 0.3-0.7 units/ml Monitor platelets by anticoagulation protocol: Yes  Plan:  Continue Heparin 800 units/hr IV infusion  Daily CBC, daily heparin level   Monitor for signs of bleeding or thrombosis  Dolly Rias RPh 09/01/2020, 1:12 AM

## 2020-09-01 NOTE — Progress Notes (Signed)
PROGRESS NOTE  Molly [name] [id number] DOB: 11-19-1950 DOA: 08/31/2020 PCP: [name], MD  HPI/Recap of past 24 hours: Molly [name] is a 70 y.o. female with medical history significant of DM2, HTN, CKD3b, PAD. Presenting with chest pain. She had left-sided, non-radiating, dull chest pain starting at 2am this morning. It was 6/10 and intermittent. She had some dyspnea at the time as well. She thought it was indigestion, and she had a little nausea. So, she tried some ginger ale. This did not help. She then had a severe headache. She checked her BP and found that it was 205/91. She took a '325mg'$  ASA and came to the ED. initial troponin was elevated 91, uptrending to 95.  Severely hypertensive, she was started on nitroglycerin drip.  Also found to have elevated D-dimer and BNP.  Cardiology was consulted.  She was admitted by [facility], hospitalist team.  No evidence of pulmonary embolism on VQ scan.  09/01/20: Seen and and examined at bedside.  Chest pain is improved.  Nitroglycerin stopped due to hypotension.   Assessment/Plan: Active Problems:   Chest pain  Chest pain rule out ACS Trop 91, 95 Cardiology following 2D echo, the left ventricle has no regional wall motion abnormality, moderate concentric LVH On heprin drip Off nitroglycerin on 09/01/20  AKI on CKD 3B Cr up-trending 1.71 from 1.38 Avid nephrotoxic agents and hypotension Monitor UO Repeat renal panel this PM  Chronic dCHF Last 2D echo 08/31/20 LVEF 50-55% G1DD Strict I&O Daily weight Management per cardiology  Acute drop hemoglobin Possibly dilutional Monitor H&H  Essential hypertension BP is now at goal. Currently off nitroglycerin drip Continue: Medication Continue to monitor vital signs.  Hyperlipidemia/GERD/neuropathy Continue home regimen  IBS with diarrhea Continue home regimen   Code Status: Full code  Family Communication: None at bedside  Disposition Plan: Likely will discharge to home  when cardiology signs off.   Consultants:  Cardiology.  Procedures:  None.  Antimicrobials:  None.  DVT prophylaxis: Heparin drip.  Status is: Inpatient    Dispo: The patient is from: Home               Anticipated d/c is to: Home when cardiology signs off.               Patient currently not stable for discharge due to ongoing management of most recent chest pain, rule out ACS.   Difficult to place patient, not applicable.         Objective: Vitals:   09/01/20 0740 09/01/20 0938 09/01/20 1000 09/01/20 1145  BP:  (!) 121/55 (!) 151/60   Pulse:  77 74   Resp:   (!) 22   Temp: (!) 97.5 F (36.4 C)   97.7 F (36.5 C)  TempSrc: Oral   Oral  SpO2:   95%   Weight:      Height:        Intake/Output Summary (Last 24 hours) at 09/01/2020 1312 Last data filed at 09/01/2020 1000 Gross per 24 hour  Intake 751.72 ml  Output 720 ml  Net 31.72 ml   Filed Weights   08/31/20 0650  Weight: 72.1 kg    Exam:  . General: 70 y.o. year-old female well developed well nourished in no acute distress.  Alert and oriented x3. . Cardiovascular: Regular rate and rhythm with no rubs or gallops.  No thyromegaly or JVD noted.   Marland Kitchen Respiratory: Clear to auscultation with no wheezes or rales. Good inspiratory effort. Marland Kitchen  Abdomen: Soft nontender nondistended with normal bowel sounds x4 quadrants. . Musculoskeletal: No lower extremity edema. 2/4 pulses in all 4 extremities. . Skin: No ulcerative lesions noted or rashes, . Psychiatry: Mood is appropriate for condition and setting   Data Reviewed: CBC: Recent Labs  Lab 08/31/20 0720 09/01/20 0042  WBC 8.9 8.5  HGB 12.4 10.7*  HCT 40.2 35.1*  MCV 88.4 89.3  PLT 250 [id number]   Basic Metabolic Panel: Recent Labs  Lab 08/31/20 0720 09/01/20 0042  NA 139 139  K 4.5 4.4  CL 104 105  CO2 26 26  GLUCOSE 164* 170*  BUN 32* 32*  CREATININE 1.38* 1.71*  CALCIUM 9.3 8.6*   GFR: Estimated Creatinine Clearance: 29.7 mL/min (A) (by  C-G formula based on SCr of 1.71 mg/dL (H)). Liver Function Tests: No results for input(s): AST, ALT, ALKPHOS, BILITOT, PROT, ALBUMIN in the last 168 hours. No results for input(s): LIPASE, AMYLASE in the last 168 hours. No results for input(s): AMMONIA in the last 168 hours. Coagulation Profile: No results for input(s): INR, PROTIME in the last 168 hours. Cardiac Enzymes: No results for input(s): CKTOTAL, CKMB, CKMBINDEX, TROPONINI in the last 168 hours. BNP (last 3 results) No results for input(s): PROBNP in the last 8760 hours. HbA1C: Recent Labs    08/31/20 1823  HGBA1C 7.0*   CBG: Recent Labs  Lab 08/31/20 1628 08/31/20 2110 09/01/20 0908 09/01/20 1155  GLUCAP 127* 156* 138* 113*   Lipid Profile: Recent Labs    08/31/20 0930  CHOL 190  HDL 71  LDLCALC 107*  TRIG 61  CHOLHDL 2.7   Thyroid Function Tests: Recent Labs    08/31/20 1823  TSH 2.514   Anemia Panel: No results for input(s): VITAMINB12, FOLATE, FERRITIN, TIBC, IRON, RETICCTPCT in the last 72 hours. Urine analysis:    Component Value Date/Time   COLORURINE YELLOW 06/29/2015 1636   APPEARANCEUR HAZY (A) 06/29/2015 1636   LABSPEC 1.030 06/29/2015 1636   PHURINE 5.5 06/29/2015 1636   GLUCOSEU >1000 (A) 06/29/2015 1636   HGBUR NEGATIVE 06/29/2015 1636   BILIRUBINUR NEGATIVE 06/29/2015 1636   KETONESUR NEGATIVE 06/29/2015 1636   PROTEINUR NEGATIVE 06/29/2015 1636   NITRITE NEGATIVE 06/29/2015 1636   LEUKOCYTESUR NEGATIVE 06/29/2015 1636   Sepsis Labs: '@LABRCNTIP'$ (procalcitonin:4,lacticidven:4)  ) Recent Results (from the past 240 hour(s))  SARS CORONAVIRUS 2 (TAT 6-24 HRS) Nasopharyngeal Nasopharyngeal Swab     Status: None   Collection Time: 08/31/20  9:34 AM   Specimen: Nasopharyngeal Swab  Result Value Ref Range Status   SARS Coronavirus 2 NEGATIVE NEGATIVE Final    Comment: (NOTE) SARS-CoV-2 target nucleic acids are NOT DETECTED.  The SARS-CoV-2 RNA is generally detectable in upper and  lower respiratory specimens during the acute phase of infection. Negative results do not preclude SARS-CoV-2 infection, do not rule out co-infections with other pathogens, and should not be used as the sole basis for treatment or other patient management decisions. Negative results must be combined with clinical observations, patient history, and epidemiological information. The expected result is Negative.  Fact Sheet for Patients: SugarRoll.be  Fact Sheet for Healthcare Providers: https://www.woods-mathews.com/  This test is not yet approved or cleared by the [place] FDA and  has been authorized for detection and/or diagnosis of SARS-CoV-2 by FDA under an Emergency Use Authorization (EUA). This EUA will remain  in effect (meaning this test can be used) for the duration of the COVID-19 declaration under Se ction 564(b)(1) of the Act, 21 U.S.C. section  360bbb-3(b)(1), unless the authorization is terminated or revoked sooner.  Performed at [facility], [place] [address], [place], [place] [address]   MRSA PCR Screening     Status: None   Collection Time: 08/31/20  6:30 PM   Specimen: Nasopharyngeal  Result Value Ref Range Status   MRSA by PCR NEGATIVE NEGATIVE Final    Comment:        The GeneXpert MRSA Assay (FDA approved for NASAL specimens only), is one component of a comprehensive MRSA colonization surveillance program. It is not intended to diagnose MRSA infection nor to guide or monitor treatment for MRSA infections. Performed at [facility], [place] [address], [place], [place] [address]       Studies: ECHOCARDIOGRAM COMPLETE  Result Date: 08/31/2020    ECHOCARDIOGRAM REPORT   Patient Name:   Molly [name] Date of Exam: 08/31/2020 Medical Rec #:  [id number]    Height:       62.0 in Accession #:    [id number]   Weight:       159.0 lb Date of Birth:  1950-12-05    BSA:          1.734 m Patient Age:     25 years     BP:           190/77 mmHg Patient Gender: F            HR:           83 bpm. Exam Location:  Inpatient Procedure: 2D Echo, Cardiac Doppler and Color Doppler Indications:    R07.9* Chest pain, unspecified  History:        Patient has prior history of Echocardiogram examinations, most                 recent 11/11/2019. Risk Factors:Hypertension, Diabetes,                 Dyslipidemia and GERD.  Sonographer:    Tiffany Dance Referring Phys: [id number] [place]  1. Left ventricular ejection fraction, by estimation, is 50 to 55%. Left ventricular ejection fraction by 3D volume is 51 %. The left ventricle has low normal function. The left ventricle has no regional wall motion abnormalities. There is moderate concentric left ventricular hypertrophy. Left ventricular diastolic parameters are consistent with Grade I diastolic dysfunction (impaired relaxation).  2. Right ventricular systolic function is normal. The right ventricular size is normal.  3. The pericardial effusion is circumferential. There is no evidence of cardiac tamponade.  4. The mitral valve is grossly normal. Trivial mitral valve regurgitation. No evidence of mitral stenosis.  5. The aortic valve is tricuspid. There is mild calcification of the aortic valve. There is mild thickening of the aortic valve. Aortic valve regurgitation is not visualized. No aortic stenosis is present.  6. The inferior vena cava is normal in size with greater than 50% respiratory variability, suggesting right atrial pressure of 3 mmHg. Comparison(s): A prior study was performed on 11/11/2019. No significant change from prior study. Prior images reviewed side by side. FINDINGS  Left Ventricle: Left ventricular ejection fraction, by estimation, is 50 to 55%. Left ventricular ejection fraction by 3D volume is 51 %. The left ventricle has low normal function. The left ventricle has no regional wall motion abnormalities. The left ventricular internal cavity  size was normal in size. There is moderate concentric left ventricular hypertrophy. Left ventricular diastolic parameters are consistent with Grade I diastolic dysfunction (impaired relaxation).  Right Ventricle: The right ventricular size is normal. No increase in right ventricular wall thickness. Right ventricular systolic function is normal. Left Atrium: Left atrial size was normal in size. Right Atrium: Right atrial size was normal in size. Pericardium: Trivial pericardial effusion is present. The pericardial effusion is circumferential. There is no evidence of cardiac tamponade. Mitral Valve: The mitral valve is grossly normal. Mild mitral annular calcification. Trivial mitral valve regurgitation. No evidence of mitral valve stenosis. Tricuspid Valve: The tricuspid valve is grossly normal. Tricuspid valve regurgitation is not demonstrated. No evidence of tricuspid stenosis. Aortic Valve: The aortic valve is tricuspid. There is mild calcification of the aortic valve. There is mild thickening of the aortic valve. Aortic valve regurgitation is not visualized. No aortic stenosis is present. Pulmonic Valve: The pulmonic valve was grossly normal. Pulmonic valve regurgitation is not visualized. No evidence of pulmonic stenosis. Aorta: The aortic root and ascending aorta are structurally normal, with no evidence of dilitation. Venous: The inferior vena cava is normal in size with greater than 50% respiratory variability, suggesting right atrial pressure of 3 mmHg. IAS/Shunts: The atrial septum is grossly normal.  LEFT VENTRICLE PLAX 2D LVIDd:         4.60 cm         Diastology LVIDs:         2.80 cm         LV e' lateral:   5.98 cm/s LV PW:         1.30 cm         LV E/e' lateral: 19.4 LV IVS:        1.00 cm LVOT diam:     1.80 cm LV SV:         45              3D Volume EF LV SV Index:   26              LV 3D EF:    Left LVOT Area:     2.54 cm                     ventricular                                              ejection                                             fraction by                                             3D volume                                             is 51 %.                                 3D Volume EF:  3D EF:        51 % RIGHT VENTRICLE             IVC RV Basal diam:  3.00 cm     IVC diam: 1.80 cm RV Mid diam:    2.70 cm RV S prime:     16.20 cm/s TAPSE (M-mode): 1.8 cm LEFT ATRIUM             Index       RIGHT ATRIUM           Index LA diam:        4.70 cm 2.71 cm/m  RA Area:     16.10 cm LA Vol (A2C):   60.7 ml 35.00 ml/m RA Volume:   36.30 ml  20.93 ml/m LA Vol (A4C):   54.0 ml 31.14 ml/m LA Biplane Vol: 58.6 ml 33.79 ml/m  AORTIC VALVE LVOT Vmax:   75.40 cm/s LVOT Vmean:  57.900 cm/s LVOT VTI:    0.177 m  AORTA Ao Root diam: 3.30 cm Ao Asc diam:  3.30 cm MITRAL VALVE MV Area (PHT): 4.46 cm     SHUNTS MV Decel Time: 170 msec     Systemic VTI:  0.18 m MV E velocity: 116.00 cm/s  Systemic Diam: 1.80 cm MV A velocity: 108.00 cm/s MV E/A ratio:  1.07 Rudean Haskell MD Electronically signed by Rudean Haskell MD Signature Date/Time: 08/31/2020/5:09:29 PM    Final    VAS US RENAL ARTERY DUPLEX  Result Date: 08/31/2020 ABDOMINAL VISCERAL Patient Name:  Molly [name]  Date of Exam:   08/31/2020 Medical Rec #: [id number]     Accession #:    [id number] Date of Birth: 23-Jun-1950     Patient Gender: F Patient Age:   69Y Exam Location:  [facility] Procedure:      VAS US RENAL ARTERY DUPLEX Referring Phys: [id number] RHONDA G BARRETT -------------------------------------------------------------------------------- High Risk Factors: Hypertension. Limitations: Air/bowel gas and patient positioning, patient unable to lie flat, rib shadow. Comparison Study: No prior studies. Performing Technologist: Oliver Hum RVT  Examination Guidelines: A complete evaluation includes B-mode imaging, spectral Doppler, color Doppler, and power Doppler as needed of all  accessible portions of each vessel. Bilateral testing is considered an integral part of a complete examination. Limited examinations for reoccurring indications may be performed as noted.  Duplex Findings: +--------------------+--------+--------+------+------------------+ Mesenteric          PSV cm/sEDV cm/sPlaque     Comments      +--------------------+--------+--------+------+------------------+ Aorta Mid                                 Unable to insonate +--------------------+--------+--------+------+------------------+ Celiac Artery Origin                      Unable to insonate +--------------------+--------+--------+------+------------------+ SMA Origin                                Unable to insonate +--------------------+--------+--------+------+------------------+    +------------------+--------+--------+-------+ Right Renal ArteryPSV cm/sEDV cm/sComment +------------------+--------+--------+-------+ Origin               88      9            +------------------+--------+--------+-------+ Proximal             75      11           +------------------+--------+--------+-------+  Mid                  64      14           +------------------+--------+--------+-------+ Distal               30      7            +------------------+--------+--------+-------+ +-----------------+--------+--------+------------------+ Left Renal ArteryPSV cm/sEDV cm/s     Comment       +-----------------+--------+--------+------------------+ Origin                           Unable to insonate +-----------------+--------+--------+------------------+ Proximal                         Unable to insonate +-----------------+--------+--------+------------------+ Mid                              Unable to insonate +-----------------+--------+--------+------------------+ Distal                           Unable to insonate  +-----------------+--------+--------+------------------+  Technologist observations: Unable to insonate the left kidney due to rib shadow and positioning. Unable to insonate the aorta due to overlying bowel gas. +------------+--------+--------+----+-----------+--------+--------+---+ Right KidneyPSV cm/sEDV cm/sRI  Left KidneyPSV cm/sEDV cm/sRI  +------------+--------+--------+----+-----------+--------+--------+---+ Upper Pole  30      7       0.77Upper Pole                     +------------+--------+--------+----+-----------+--------+--------+---+ Mid         30      7       0.75Mid                            +------------+--------+--------+----+-----------+--------+--------+---+ Lower Pole  36      12      0.67Lower Pole                     +------------+--------+--------+----+-----------+--------+--------+---+ Hilar       35      7       0.79Hilar                          +------------+--------+--------+----+-----------+--------+--------+---+ +------------------+-----+------------------++ Right Kidney           Left Kidney        +------------------+-----+------------------++ RAR                    RAR                +------------------+-----+------------------++ RAR (manual)           RAR (manual)       +------------------+-----+------------------++ Cortex                 Cortex             +------------------+-----+------------------++ Cortex thickness       Corex thickness    +------------------+-----+------------------++ Kidney length (cm)11.00Kidney length (cm) +------------------+-----+------------------++  Summary: Renal:  Right: Normal size right kidney.  *See table(s) above for measurements and observations.  Diagnosing physician: Deitra Mayo MD  Electronically signed by Deitra Mayo MD on 08/31/2020 at 3:47:31 PM.    Final  Scheduled Meds: . amLODipine  10 mg Oral QHS  . aspirin  81 mg Oral Daily  .  cetirizine  5 mg Oral QHS  . Chlorhexidine Gluconate Cloth  6 each Topical Daily  . cholecalciferol  2,000 Units Oral Daily  . cilostazol  50 mg Oral BID  . ezetimibe  10 mg Oral Daily  . famotidine  20 mg Oral Daily  . gabapentin  100 mg Oral TID  . insulin aspart  0-5 Units Subcutaneous QHS  . insulin aspart  0-9 Units Subcutaneous TID WC  . isosorbide mononitrate  60 mg Oral Daily  . lipase/protease/amylase  72,000 Units Oral TID with meals  . mouth rinse  15 mL Mouth Rinse BID  . metoprolol succinate  50 mg Oral Daily  . rosuvastatin  40 mg Oral Daily  . sodium chloride flush  3 mL Intravenous Q12H    Continuous Infusions: . sodium chloride    . sodium chloride 50 mL/hr at 09/01/20 1000  . heparin 800 Units/hr (09/01/20 1000)     LOS: 1 day     Kayleen Memos, MD [facility] Pager [phone]  If 7PM-7AM, please contact night-coverage [email or web address] Password [email or web address] 09/01/2020, 1:12 PM

## 2020-09-01 NOTE — Progress Notes (Signed)
NOTED NITRO GTT DECREASED TO 10MCQ /MIN

## 2020-09-01 NOTE — Progress Notes (Signed)
NOTED TRENDING DECREASE IN sb/p. Nitro gtt decreased to 50 mcq

## 2020-09-01 NOTE — Progress Notes (Signed)
NITRO GTT ON HOLD AT THIS TIME

## 2020-09-02 ENCOUNTER — Encounter (HOSPITAL_COMMUNITY)
Admission: EM | Disposition: A | Discharge status: Home Health | Payer: Self-pay | Source: Home / Self Care | Attending: Internal Medicine

## 2020-09-02 DIAGNOSIS — R079 Chest pain, unspecified: Secondary | ICD-10-CM | POA: Diagnosis not present

## 2020-09-02 DIAGNOSIS — I214 Non-ST elevation (NSTEMI) myocardial infarction: Secondary | ICD-10-CM | POA: Diagnosis not present

## 2020-09-02 LAB — CBC
HCT: 31.9 % — ABNORMAL LOW (ref 36.0–46.0)
Hemoglobin: 9.5 g/dL — ABNORMAL LOW (ref 12.0–15.0)
MCH: 27.2 pg (ref 26.0–34.0)
MCHC: 29.8 g/dL — ABNORMAL LOW (ref 30.0–36.0)
MCV: 91.4 fL (ref 80.0–100.0)
Platelets: 203 10*3/uL (ref 150–400)
RBC: 3.49 MIL/uL — ABNORMAL LOW (ref 3.87–5.11)
RDW: 14.6 % (ref 11.5–15.5)
WBC: 5.8 10*3/uL (ref 4.0–10.5)
nRBC: 0 % (ref 0.0–0.2)

## 2020-09-02 LAB — GLUCOSE, CAPILLARY
Glucose-Capillary: 180 mg/dL — ABNORMAL HIGH (ref 70–99)
Glucose-Capillary: 128 mg/dL — ABNORMAL HIGH (ref 70–99)
Glucose-Capillary: 129 mg/dL — ABNORMAL HIGH (ref 70–99)
Glucose-Capillary: 158 mg/dL — ABNORMAL HIGH (ref 70–99)

## 2020-09-02 LAB — BASIC METABOLIC PANEL
Anion gap: 3 — ABNORMAL LOW (ref 5–15)
BUN: 46 mg/dL — ABNORMAL HIGH (ref 8–23)
CO2: 24 mmol/L (ref 22–32)
Calcium: 7.8 mg/dL — ABNORMAL LOW (ref 8.9–10.3)
Chloride: 108 mmol/L (ref 98–111)
Creatinine, Ser: 2.24 mg/dL — ABNORMAL HIGH (ref 0.44–1.00)
GFR, Estimated: 23 mL/min — ABNORMAL LOW (ref >60)
Glucose, Bld: 187 mg/dL — ABNORMAL HIGH (ref 70–99)
Potassium: 4.9 mmol/L (ref 3.5–5.1)
Sodium: 135 mmol/L (ref 135–145)

## 2020-09-02 LAB — HEPARIN LEVEL (UNFRACTIONATED): Heparin Unfractionated: 0.42 IU/mL (ref 0.30–0.70)

## 2020-09-02 SURGERY — LEFT HEART CATH AND CORONARY ANGIOGRAPHY
Anesthesia: LOCAL

## 2020-09-02 MED ORDER — HYDRALAZINE HCL 20 MG/ML IJ SOLN
5.0000 mg | Freq: Four times a day (QID) | INTRAMUSCULAR | Status: DC | PRN
  Administered 2020-09-05 – 2020-09-09 (×8): 5 mg via INTRAVENOUS
  Filled 2020-09-02 (×9): qty 1

## 2020-09-02 MED ORDER — METHOCARBAMOL 500 MG PO TABS: 500.0000 mg | ORAL_TABLET | Freq: Once | ORAL | Status: DC

## 2020-09-02 NOTE — Progress Notes (Signed)
PROGRESS NOTE  Molly [name] [id number] DOB: October 05, 1950 DOA: 08/31/2020 PCP: [name], MD  HPI/Recap of past 24 hours: Molly [name] is a 70 y.o. female with medical history significant of DM2, HTN, CKD3b, PAD. Presenting with left-sided nonradiating chest pain associated with dyspnea and nausea.  Work-up in the ED revealed hypertensive emergency with SBP in the 200s and elevated troponin.  She was started on heparin drip and cardiology was consulted.  Seen by cardiology.  Due to worsening renal function heart cath was postponed.  She had a VQ scan that was negative for pulmonary embolism.  Hospital course complicated by worsening AKI on CKD 3B with oliguria.  Ongoing gentle IV fluid hydration.  09/02/20: Patient was seen and examined at her bedside.  She denies having any chest maxima this visit.  Reports persistent dyspnea with minimal exertion.  She is currently on 2 L O2 saturation 94%.   Assessment/Plan: Active Problems:   Chest pain  Chest pain rule out ACS Trop 91, 95 Cardiology following.  Plan for heart cath was delayed due to worsening renal function. 2D echo done on 08/31/2020, the left ventricle has no regional wall motion abnormality, moderate concentric LVH Ongoing heparin drip infusion. Off nitroglycerin since 09/01/20 At the time of visit she denies any anginal symptoms. She is currently on aspirin 81 mg daily, Coreg 25 mg twice daily, Pletal 50 mg twice daily, Zetia 10 mg daily, Crestor 40 mg daily. Continue to monitor on telemetry Management per cardiology.  Acute hypoxic respiratory failure possibly secondary to atelectasis Afebrile with no leukocytosis Nonseptic appearing Procalcitonin negative on 08/31/2020 Start incentive spirometer and flutter valve Mobilize as tolerated  Resolved hypertensive emergency Presented with SBP in the 200 with elevated troponin BP is currently at goal. Continue Norvasc 10 mg daily, Coreg 25 mg twice daily Continue  to monitor vital signs.  Worsening AKI on CKD 3B Cr up-trending 2.24 from 1.88 from 1.71 from 1.38 Avoid nephrotoxic agents and hypotension 300 cc urine output recorded in the last 24 hours. Closely monitor urine output. If no improvement of renal function will consult nephrology in the morning. Repeat renal panel this PM  Chronic dCHF Last 2D echo 08/31/20 LVEF 50-55% G1DD Continue strict I&O Continue Daily weight Management per cardiology  Acute drop hemoglobin Possibly dilutional Monitor H&H Hemoglobin 9.5 from 10.7 from 12.4. Noted bleeding Continue to closely monitor and repeat CBC in the morning  Hyperlipidemia/GERD/neuropathy Continue home regimen  IBS with diarrhea Continue home regimen   Code Status: Full code  Family Communication: None at bedside  Disposition Plan: Likely will discharge to home when cardiology signs off.   Consultants:  Cardiology.  Procedures:  2D echo  Antimicrobials:  None.  DVT prophylaxis: Heparin drip.  Status is: Inpatient    Dispo: The patient is from: Home               Anticipated d/c is to: Home when cardiology signs off.               Patient currently not stable for discharge due to ongoing management of most recent chest pain, rule out ACS.   Difficult to place patient, not applicable.         Objective: Vitals:   09/02/20 1200 09/02/20 1300 09/02/20 1400 09/02/20 1500  BP: (!) 114/45 (!) 134/58 131/66 (!) 117/45  Pulse: 63 61 62 60  Resp: (!) '21 17 16 18  '$ Temp: 97.9 F (36.6 C)     TempSrc:  Oral     SpO2: 94% 94% 95% 94%  Weight:      Height:        Intake/Output Summary (Last 24 hours) at 09/02/2020 1541 Last data filed at 09/02/2020 0900 Gross per 24 hour  Intake 1092.24 ml  Output --  Net 1092.24 ml   Filed Weights   08/31/20 0650  Weight: 72.1 kg    Exam:  . General: 70 y.o. year-old female frail-appearing no acute distress.  She is alert and oriented x3.   . Cardiovascular:  Regular rate and rhythm no rubs or gallops.   Marland Kitchen Respiratory: Mild rales at bases no wheezing noted.  Poor inspiratory effort.   . Abdomen: Soft nontender normal bowel sounds present.  Musculoskeletal: No lower extremity edema bilaterally. . Skin: No ulcerative lesions noted.   Marland Kitchen Psychiatry: Mood is appropriate for condition and setting.   Data Reviewed: CBC: Recent Labs  Lab 08/31/20 0720 09/01/20 0042 09/02/20 0257  WBC 8.9 8.5 5.8  HGB 12.4 10.7* 9.5*  HCT 40.2 35.1* 31.9*  MCV 88.4 89.3 91.4  PLT 250 234 [id number]   Basic Metabolic Panel: Recent Labs  Lab 08/31/20 0720 09/01/20 0042 09/01/20 1552 09/02/20 0257  NA 139 139 138 135  K 4.5 4.4 4.3 4.9  CL 104 105 106 108  CO2 '26 26 23 24  '$ GLUCOSE 164* 170* 149* 187*  BUN 32* 32* 38* 46*  CREATININE 1.38* 1.71* 1.88* 2.24*  CALCIUM 9.3 8.6* 8.2* 7.8*   GFR: Estimated Creatinine Clearance: 22.4 mL/min (A) (by C-G formula based on SCr of 2.24 mg/dL (H)). Liver Function Tests: No results for input(s): AST, ALT, ALKPHOS, BILITOT, PROT, ALBUMIN in the last 168 hours. No results for input(s): LIPASE, AMYLASE in the last 168 hours. No results for input(s): AMMONIA in the last 168 hours. Coagulation Profile: No results for input(s): INR, PROTIME in the last 168 hours. Cardiac Enzymes: No results for input(s): CKTOTAL, CKMB, CKMBINDEX, TROPONINI in the last 168 hours. BNP (last 3 results) No results for input(s): PROBNP in the last 8760 hours. HbA1C: Recent Labs    08/31/20 1823  HGBA1C 7.0*   CBG: Recent Labs  Lab 09/01/20 1155 09/01/20 1608 09/01/20 2111 09/02/20 0737 09/02/20 1154  GLUCAP 113* 148* 197* 180* 128*   Lipid Profile: Recent Labs    08/31/20 0930  CHOL 190  HDL 71  LDLCALC 107*  TRIG 61  CHOLHDL 2.7   Thyroid Function Tests: Recent Labs    08/31/20 1823  TSH 2.514   Anemia Panel: No results for input(s): VITAMINB12, FOLATE, FERRITIN, TIBC, IRON, RETICCTPCT in the last 72 hours. Urine  analysis:    Component Value Date/Time   COLORURINE YELLOW 06/29/2015 1636   APPEARANCEUR HAZY (A) 06/29/2015 1636   LABSPEC 1.030 06/29/2015 1636   PHURINE 5.5 06/29/2015 1636   GLUCOSEU >1000 (A) 06/29/2015 1636   HGBUR NEGATIVE 06/29/2015 1636   BILIRUBINUR NEGATIVE 06/29/2015 1636   KETONESUR NEGATIVE 06/29/2015 1636   PROTEINUR NEGATIVE 06/29/2015 1636   NITRITE NEGATIVE 06/29/2015 1636   LEUKOCYTESUR NEGATIVE 06/29/2015 1636   Sepsis Labs: '@LABRCNTIP'$ (procalcitonin:4,lacticidven:4)  ) Recent Results (from the past 240 hour(s))  SARS CORONAVIRUS 2 (TAT 6-24 HRS) Nasopharyngeal Nasopharyngeal Swab     Status: None   Collection Time: 08/31/20  9:34 AM   Specimen: Nasopharyngeal Swab  Result Value Ref Range Status   SARS Coronavirus 2 NEGATIVE NEGATIVE Final    Comment: (NOTE) SARS-CoV-2 target nucleic acids are NOT DETECTED.  The SARS-CoV-2 RNA  is generally detectable in upper and lower respiratory specimens during the acute phase of infection. Negative results do not preclude SARS-CoV-2 infection, do not rule out co-infections with other pathogens, and should not be used as the sole basis for treatment or other patient management decisions. Negative results must be combined with clinical observations, patient history, and epidemiological information. The expected result is Negative.  Fact Sheet for Patients: SugarRoll.be  Fact Sheet for Healthcare Providers: https://www.woods-mathews.com/  This test is not yet approved or cleared by the [place] FDA and  has been authorized for detection and/or diagnosis of SARS-CoV-2 by FDA under an Emergency Use Authorization (EUA). This EUA will remain  in effect (meaning this test can be used) for the duration of the COVID-19 declaration under Se ction 564(b)(1) of the Act, 21 U.S.C. section 360bbb-3(b)(1), unless the authorization is terminated or revoked sooner.  Performed at  [facility], [place] [address], [place], [place] [address]   MRSA PCR Screening     Status: None   Collection Time: 08/31/20  6:30 PM   Specimen: Nasopharyngeal  Result Value Ref Range Status   MRSA by PCR NEGATIVE NEGATIVE Final    Comment:        The GeneXpert MRSA Assay (FDA approved for NASAL specimens only), is one component of a comprehensive MRSA colonization surveillance program. It is not intended to diagnose MRSA infection nor to guide or monitor treatment for MRSA infections. Performed at [facility], [place] [address] [place], [place] [address]       Studies: No results found.  Scheduled Meds: . amLODipine  10 mg Oral QHS  . aspirin  81 mg Oral Daily  . carvedilol  25 mg Oral BID WC  . cetirizine  5 mg Oral QHS  . Chlorhexidine Gluconate Cloth  6 each Topical Daily  . cholecalciferol  2,000 Units Oral Daily  . cilostazol  50 mg Oral BID  . ezetimibe  10 mg Oral Daily  . famotidine  20 mg Oral Daily  . gabapentin  100 mg Oral TID  . insulin aspart  0-5 Units Subcutaneous QHS  . insulin aspart  0-9 Units Subcutaneous TID WC  . lipase/protease/amylase  72,000 Units Oral TID with meals  . mouth rinse  15 mL Mouth Rinse BID  . rosuvastatin  40 mg Oral Daily  . sodium chloride flush  3 mL Intravenous Q12H    Continuous Infusions: . sodium chloride    . sodium chloride 50 mL/hr at 09/02/20 0900  . heparin 800 Units/hr (09/02/20 0900)     LOS: 2 days     Kayleen Memos, MD [facility] Pager [phone]  If 7PM-7AM, please contact night-coverage [email or web address] Password [facility] 09/02/2020, 3:41 PM

## 2020-09-02 NOTE — Progress Notes (Signed)
ANTICOAGULATION CONSULT NOTE - Follow Up Consult  Pharmacy Consult for IV heparin Indication: chest pain/ACS  Allergies  Allergen Reactions  . Mercury Nausea And Vomiting    Patient Measurements: Height: '5\' 2"'$  (157.5 cm) Weight: 72.1 kg (159 lb) IBW/kg (Calculated) : 50.1 Heparin Dosing Weight: 66 kg  Vital Signs: Temp: 98.2 F (36.8 C) (05/19 0800) Temp Source: Oral (05/19 0800) BP: 136/53 (05/19 0900) Pulse Rate: 66 (05/19 0900)  Labs: Recent Labs    08/31/20 0720 08/31/20 0934 08/31/20 1823 09/01/20 0042 09/01/20 1552 09/02/20 0257  HGB 12.4  --   --  10.7*  --  9.5*  HCT 40.2  --   --  35.1*  --  31.9*  PLT 250  --   --  234  --  203  HEPARINUNFRC  --   --  0.65 0.59  --  0.42  CREATININE 1.38*  --   --  1.71* 1.88* 2.24*  TROPONINIHS 91* 95*  --   --   --   --     Estimated Creatinine Clearance: 22.4 mL/min (A) (by C-G formula based on SCr of 2.24 mg/dL (H)).  Assessment: 35 yoF with PMH DM2, HTN, CKD3, PAD, presents with chest pain and dyspnea. High sens trop and D-dimer minimally elevated, but chest pain significantly improved with NTG and Pharmacy consulted for IV heparin for ACS r/o. Patient to also undergo VQ scan to r/o PE  Today, 09/02/2020:  Daily heparin level stable at goal on 800 units/hr  CBC: Hgb continues to drop but no noted bleeding; Plts WNL  CKD3; SCr ~1.4 at baseline, now acutely elevated and rising  No bleeding or infusion issues per RN  Cardiology would like to pursue cath but continues to be delayed d/t AKI  Goal of Therapy: Heparin level 0.3-0.7 units/ml Monitor platelets by anticoagulation protocol: Yes  Plan:  Continue Heparin 800 units/hr IV infusion  Daily CBC, daily heparin level   Monitor for signs of bleeding or thrombosis  Reuel Boom, PharmD, BCPS [phone] 09/02/2020, 12:03 PM

## 2020-09-02 NOTE — Progress Notes (Signed)
Cardiology Progress Note  Patient ID: Molly [name] MRN: [id number] DOB: 07-Aug-1950 Date of Encounter: 09/02/2020  Primary Cardiologist: Jenne Campus, MD  Subjective   Chief Complaint: Shortness of breath  HPI: Still short of breath with exertion.  Unclear etiology.  She was admitted with hypertensive crisis.  She does not appear volume overloaded.  Echocardiogram with normal PA pressures  ROS:  All other ROS reviewed and negative. Pertinent positives noted in the HPI.     Inpatient Medications  Scheduled Meds: . amLODipine  10 mg Oral QHS  . aspirin  81 mg Oral Daily  . carvedilol  25 mg Oral BID WC  . cetirizine  5 mg Oral QHS  . Chlorhexidine Gluconate Cloth  6 each Topical Daily  . cholecalciferol  2,000 Units Oral Daily  . cilostazol  50 mg Oral BID  . ezetimibe  10 mg Oral Daily  . famotidine  20 mg Oral Daily  . gabapentin  100 mg Oral TID  . insulin aspart  0-5 Units Subcutaneous QHS  . insulin aspart  0-9 Units Subcutaneous TID WC  . lipase/protease/amylase  72,000 Units Oral TID with meals  . mouth rinse  15 mL Mouth Rinse BID  . rosuvastatin  40 mg Oral Daily  . sodium chloride flush  3 mL Intravenous Q12H   Continuous Infusions: . sodium chloride    . sodium chloride 50 mL/hr at 09/02/20 0900  . heparin 800 Units/hr (09/02/20 0900)   PRN Meds: sodium chloride, acetaminophen, ALPRAZolam, diphenhydrAMINE, hydrALAZINE, loperamide, nitroGLYCERIN, ondansetron (ZOFRAN) IV, sodium chloride flush   Vital Signs   Vitals:   09/02/20 0349 09/02/20 0400 09/02/20 0800 09/02/20 0900  BP:  (!) 106/39 104/68 (!) 136/53  Pulse:  (!) 52 66 66  Resp:  20 20 (!) 23  Temp: 98.1 F (36.7 C)  98.2 F (36.8 C)   TempSrc: Oral  Oral   SpO2:  91% 94% 94%  Weight:      Height:        Intake/Output Summary (Last 24 hours) at 09/02/2020 1025 Last data filed at 09/02/2020 0900 Gross per 24 hour  Intake 1564.08 ml  Output --  Net 1564.08 ml   Last 3 Weights 08/31/2020  06/03/2020 05/18/2020  Weight (lbs) 159 lb 157 lb 9.6 oz 155 lb  Weight (kg) 72.122 kg 71.487 kg 70.308 kg      Telemetry  Overnight telemetry shows sinus rhythm in the 60s, which I personally reviewed.   Physical Exam   Vitals:   09/02/20 0349 09/02/20 0400 09/02/20 0800 09/02/20 0900  BP:  (!) 106/39 104/68 (!) 136/53  Pulse:  (!) 52 66 66  Resp:  20 20 (!) 23  Temp: 98.1 F (36.7 C)  98.2 F (36.8 C)   TempSrc: Oral  Oral   SpO2:  91% 94% 94%  Weight:      Height:         Intake/Output Summary (Last 24 hours) at 09/02/2020 1025 Last data filed at 09/02/2020 0900 Gross per 24 hour  Intake 1564.08 ml  Output --  Net 1564.08 ml    Last 3 Weights 08/31/2020 06/03/2020 05/18/2020  Weight (lbs) 159 lb 157 lb 9.6 oz 155 lb  Weight (kg) 72.122 kg 71.487 kg 70.308 kg    Body mass index is 29.08 kg/m.  General: Well nourished, well developed, in no acute distress Head: Atraumatic, normal size  Eyes: PEERLA, EOMI  Neck: Supple, no JVD Endocrine: No thryomegaly Cardiac: Normal S1, S2;  RRR; no murmurs, rubs, or gallops Lungs: Diminished breath sounds bilaterally Abd: Soft, nontender, no hepatomegaly  Ext: No edema, pulses 2+ Musculoskeletal: No deformities, BUE and BLE strength normal and equal Skin: Warm and dry, no rashes   Neuro: Alert and oriented to person, place, time, and situation, CNII-XII grossly intact, no focal deficits  Psych: Normal mood and affect   Labs  High Sensitivity Troponin:   Recent Labs  Lab 08/31/20 0720 08/31/20 0934  TROPONINIHS 91* 95*     Cardiac EnzymesNo results for input(s): TROPONINI in the last 168 hours. No results for input(s): TROPIPOC in the last 168 hours.  Chemistry Recent Labs  Lab 09/01/20 0042 09/01/20 1552 09/02/20 0257  NA 139 138 135  K 4.4 4.3 4.9  CL 105 106 108  CO2 '26 23 24  '$ GLUCOSE 170* 149* 187*  BUN 32* 38* 46*  CREATININE 1.71* 1.88* 2.24*  CALCIUM 8.6* 8.2* 7.8*  GFRNONAA 32* 29* 23*  ANIONGAP 8 9 3*     Hematology Recent Labs  Lab 08/31/20 0720 09/01/20 0042 09/02/20 0257  WBC 8.9 8.5 5.8  RBC 4.55 3.93 3.49*  HGB 12.4 10.7* 9.5*  HCT 40.2 35.1* 31.9*  MCV 88.4 89.3 91.4  MCH 27.3 27.2 27.2  MCHC 30.8 30.5 29.8*  RDW 14.4 14.2 14.6  PLT 250 234 203   BNP Recent Labs  Lab 08/31/20 0720  BNP 421.0*    DDimer  Recent Labs  Lab 08/31/20 0720  DDIMER 0.83*     Radiology  CT CHEST WO CONTRAST  Result Date: 08/31/2020 CLINICAL DATA:  Chest pain. EXAM: CT CHEST WITHOUT CONTRAST TECHNIQUE: Multidetector CT imaging of the chest was performed following the standard protocol without IV contrast. COMPARISON:  January 10, 2016. FINDINGS: Cardiovascular: Atherosclerosis of thoracic aorta is noted without aneurysm formation. Normal cardiac size. Coronary artery calcifications are noted. No pericardial effusion is noted. Mediastinum/Nodes: No enlarged mediastinal or axillary lymph nodes. Thyroid gland, trachea, and esophagus demonstrate no significant findings. Lungs/Pleura: No pneumothorax or pleural effusion is noted. Left basilar and lingular opacities are noted concerning for atelectasis or pneumonia. Upper Abdomen: No acute abnormality. Musculoskeletal: No chest wall mass or suspicious bone lesions identified. IMPRESSION: Left basilar and lingular opacities are noted concerning for atelectasis or pneumonia. Coronary artery calcifications are noted. Aortic Atherosclerosis (ICD10-I70.0). Electronically Signed   By: Marijo Conception M.D.   On: 08/31/2020 12:30   ECHOCARDIOGRAM COMPLETE  Result Date: 08/31/2020    ECHOCARDIOGRAM REPORT   Patient Name:   Molly [name] Date of Exam: 08/31/2020 Medical Rec #:  [id number]    Height:       62.0 in Accession #:    [id number]   Weight:       159.0 lb Date of Birth:  02-15-1951    BSA:          1.734 m Patient Age:    70 years     BP:           190/77 mmHg Patient Gender: F            HR:           83 bpm. Exam Location:  Inpatient Procedure: 2D Echo,  Cardiac Doppler and Color Doppler Indications:    R07.9* Chest pain, unspecified  History:        Patient has prior history of Echocardiogram examinations, most                 recent 11/11/2019.  Risk Factors:Hypertension, Diabetes,                 Dyslipidemia and GERD.  Sonographer:    Tiffany Dance Referring Phys: [id number] [place]  1. Left ventricular ejection fraction, by estimation, is 50 to 55%. Left ventricular ejection fraction by 3D volume is 51 %. The left ventricle has low normal function. The left ventricle has no regional wall motion abnormalities. There is moderate concentric left ventricular hypertrophy. Left ventricular diastolic parameters are consistent with Grade I diastolic dysfunction (impaired relaxation).  2. Right ventricular systolic function is normal. The right ventricular size is normal.  3. The pericardial effusion is circumferential. There is no evidence of cardiac tamponade.  4. The mitral valve is grossly normal. Trivial mitral valve regurgitation. No evidence of mitral stenosis.  5. The aortic valve is tricuspid. There is mild calcification of the aortic valve. There is mild thickening of the aortic valve. Aortic valve regurgitation is not visualized. No aortic stenosis is present.  6. The inferior vena cava is normal in size with greater than 50% respiratory variability, suggesting right atrial pressure of 3 mmHg. Comparison(s): A prior study was performed on 11/11/2019. No significant change from prior study. Prior images reviewed side by side. FINDINGS  Left Ventricle: Left ventricular ejection fraction, by estimation, is 50 to 55%. Left ventricular ejection fraction by 3D volume is 51 %. The left ventricle has low normal function. The left ventricle has no regional wall motion abnormalities. The left ventricular internal cavity size was normal in size. There is moderate concentric left ventricular hypertrophy. Left ventricular diastolic parameters are consistent  with Grade I diastolic dysfunction (impaired relaxation). Right Ventricle: The right ventricular size is normal. No increase in right ventricular wall thickness. Right ventricular systolic function is normal. Left Atrium: Left atrial size was normal in size. Right Atrium: Right atrial size was normal in size. Pericardium: Trivial pericardial effusion is present. The pericardial effusion is circumferential. There is no evidence of cardiac tamponade. Mitral Valve: The mitral valve is grossly normal. Mild mitral annular calcification. Trivial mitral valve regurgitation. No evidence of mitral valve stenosis. Tricuspid Valve: The tricuspid valve is grossly normal. Tricuspid valve regurgitation is not demonstrated. No evidence of tricuspid stenosis. Aortic Valve: The aortic valve is tricuspid. There is mild calcification of the aortic valve. There is mild thickening of the aortic valve. Aortic valve regurgitation is not visualized. No aortic stenosis is present. Pulmonic Valve: The pulmonic valve was grossly normal. Pulmonic valve regurgitation is not visualized. No evidence of pulmonic stenosis. Aorta: The aortic root and ascending aorta are structurally normal, with no evidence of dilitation. Venous: The inferior vena cava is normal in size with greater than 50% respiratory variability, suggesting right atrial pressure of 3 mmHg. IAS/Shunts: The atrial septum is grossly normal.  LEFT VENTRICLE PLAX 2D LVIDd:         4.60 cm         Diastology LVIDs:         2.80 cm         LV e' lateral:   5.98 cm/s LV PW:         1.30 cm         LV E/e' lateral: 19.4 LV IVS:        1.00 cm LVOT diam:     1.80 cm LV SV:         45  3D Volume EF LV SV Index:   26              LV 3D EF:    Left LVOT Area:     2.54 cm                     ventricular                                             ejection                                             fraction by                                             3D volume                                              is 51 %.                                 3D Volume EF:                                3D EF:        51 % RIGHT VENTRICLE             IVC RV Basal diam:  3.00 cm     IVC diam: 1.80 cm RV Mid diam:    2.70 cm RV S prime:     16.20 cm/s TAPSE (M-mode): 1.8 cm LEFT ATRIUM             Index       RIGHT ATRIUM           Index LA diam:        4.70 cm 2.71 cm/m  RA Area:     16.10 cm LA Vol (A2C):   60.7 ml 35.00 ml/m RA Volume:   36.30 ml  20.93 ml/m LA Vol (A4C):   54.0 ml 31.14 ml/m LA Biplane Vol: 58.6 ml 33.79 ml/m  AORTIC VALVE LVOT Vmax:   75.40 cm/s LVOT Vmean:  57.900 cm/s LVOT VTI:    0.177 m  AORTA Ao Root diam: 3.30 cm Ao Asc diam:  3.30 cm MITRAL VALVE MV Area (PHT): 4.46 cm     SHUNTS MV Decel Time: 170 msec     Systemic VTI:  0.18 m MV E velocity: 116.00 cm/s  Systemic Diam: 1.80 cm MV A velocity: 108.00 cm/s MV E/A ratio:  1.07 Rudean Haskell MD Electronically signed by Rudean Haskell MD Signature Date/Time: 08/31/2020/5:09:29 PM    Final    VAS US RENAL ARTERY DUPLEX  Result Date: 08/31/2020 ABDOMINAL VISCERAL Patient Name:  Molly [name]  Date of Exam:   08/31/2020 Medical Rec #: [id number]     Accession #:    [id number] Date of  Birth: 02/13/1951     Patient Gender: F Patient Age:   69Y Exam Location:  [facility] Procedure:      VAS US RENAL ARTERY DUPLEX Referring Phys: [id number] RHONDA G BARRETT -------------------------------------------------------------------------------- High Risk Factors: Hypertension. Limitations: Air/bowel gas and patient positioning, patient unable to lie flat, rib shadow. Comparison Study: No prior studies. Performing Technologist: Oliver Hum RVT  Examination Guidelines: A complete evaluation includes B-mode imaging, spectral Doppler, color Doppler, and power Doppler as needed of all accessible portions of each vessel. Bilateral testing is considered an integral part of a complete examination. Limited examinations for  reoccurring indications may be performed as noted.  Duplex Findings: +--------------------+--------+--------+------+------------------+ Mesenteric          PSV cm/sEDV cm/sPlaque     Comments      +--------------------+--------+--------+------+------------------+ Aorta Mid                                 Unable to insonate +--------------------+--------+--------+------+------------------+ Celiac Artery Origin                      Unable to insonate +--------------------+--------+--------+------+------------------+ SMA Origin                                Unable to insonate +--------------------+--------+--------+------+------------------+    +------------------+--------+--------+-------+ Right Renal ArteryPSV cm/sEDV cm/sComment +------------------+--------+--------+-------+ Origin               88      9            +------------------+--------+--------+-------+ Proximal             75      11           +------------------+--------+--------+-------+ Mid                  64      14           +------------------+--------+--------+-------+ Distal               30      7            +------------------+--------+--------+-------+ +-----------------+--------+--------+------------------+ Left Renal ArteryPSV cm/sEDV cm/s     Comment       +-----------------+--------+--------+------------------+ Origin                           Unable to insonate +-----------------+--------+--------+------------------+ Proximal                         Unable to insonate +-----------------+--------+--------+------------------+ Mid                              Unable to insonate +-----------------+--------+--------+------------------+ Distal                           Unable to insonate +-----------------+--------+--------+------------------+  Technologist observations: Unable to insonate the left kidney due to rib shadow and positioning. Unable to insonate the aorta due  to overlying bowel gas. +------------+--------+--------+----+-----------+--------+--------+---+ Right KidneyPSV cm/sEDV cm/sRI  Left KidneyPSV cm/sEDV cm/sRI  +------------+--------+--------+----+-----------+--------+--------+---+ Upper Pole  30      7       0.77Upper Pole                     +------------+--------+--------+----+-----------+--------+--------+---+  Mid         30      7       0.75Mid                            +------------+--------+--------+----+-----------+--------+--------+---+ Lower Pole  36      12      0.67Lower Pole                     +------------+--------+--------+----+-----------+--------+--------+---+ Hilar       35      7       0.79Hilar                          +------------+--------+--------+----+-----------+--------+--------+---+ +------------------+-----+------------------++ Right Kidney           Left Kidney        +------------------+-----+------------------++ RAR                    RAR                +------------------+-----+------------------++ RAR (manual)           RAR (manual)       +------------------+-----+------------------++ Cortex                 Cortex             +------------------+-----+------------------++ Cortex thickness       Corex thickness    +------------------+-----+------------------++ Kidney length (cm)11.00Kidney length (cm) +------------------+-----+------------------++  Summary: Renal:  Right: Normal size right kidney.  *See table(s) above for measurements and observations.  Diagnosing physician: Deitra Mayo MD  Electronically signed by Deitra Mayo MD on 08/31/2020 at 3:47:31 PM.    Final     Cardiac Studies  TTE 08/31/2020 1. Left ventricular ejection fraction, by estimation, is 50 to 55%. Left  ventricular ejection fraction by 3D volume is 51 %. The left ventricle has  low normal function. The left ventricle has no regional wall motion  abnormalities. There  is moderate  concentric left ventricular hypertrophy. Left ventricular diastolic  parameters are consistent with Grade I diastolic dysfunction (impaired  relaxation).  2. Right ventricular systolic function is normal. The right ventricular  size is normal.  3. The pericardial effusion is circumferential. There is no evidence of  cardiac tamponade.  4. The mitral valve is grossly normal. Trivial mitral valve  regurgitation. No evidence of mitral stenosis.  5. The aortic valve is tricuspid. There is mild calcification of the  aortic valve. There is mild thickening of the aortic valve. Aortic valve  regurgitation is not visualized. No aortic stenosis is present.  6. The inferior vena cava is normal in size with greater than 50%  respiratory variability, suggesting right atrial pressure of 3 mmHg.  Patient Profile  70 year old female history of diabetes, hypertension, severe PAD, CKD stage III, hyperlipidemia who was admitted on 08/31/2020 with accelerated hypertension and elevated troponin.   Assessment & Plan   1.  Accelerated hypertension/elevated troponin concerning for non-STEMI/acute hypoxic respiratory failure/Elevated BNP -Admitted with severely elevated blood pressures in the 200 range.  BNP was elevated.  She did not appear grossly volume overloaded.  Chest CT with possible pneumonia.  Procalcitonin is negative.  No fevers no white count. -She did report chest pain.  Troponins are minimally elevated and flat.  Echocardiogram shows normal LV function without regional wall motion normalities.  She has normal PA  pressure. -She may have had flash pulmonary edema in the setting of accelerated hypertension.  We did check renal artery ultrasounds but they were nondiagnostic as she was nonfasting. -I do want to pursue a left heart catheterization however kidney function is precluding this right now.  Her creatinine continues to rise. -We will cancel her heart cath today. -Overall I am  concerned she may have significant CAD.  She may have had accelerated hypertension that caused her to be transiently ischemic and to have flash pulmonary edema.  It did resolve quickly I suspect.  I have no other explanation. -We will continue aspirin and heparin drip for now.  We cannot pursue left heart cath until kidney function improves.  We will tentatively plan for tomorrow.  She will be n.p.o. at midnight. -Her blood pressure is well controlled and she is off the nitro drip.  She can be transferred to a regular floor. -She has no further chest pain.  Her chest pain occurred with accelerated hypertension. -Left heart cath will also give [place] an idea of filling pressures with LVEDP.  This will be important to know. -Since she will be n.p.o. tomorrow we will plan to repeat a renal artery ultrasound.  She did have significant renal artery disease (90% left renal artery stenosis) last year.  No significant disease in the right.  I think she may have possibly had progression.  She would have to have bilateral renal artery stenosis to explain her accelerated hypertension.  It has improved with medications but goes against this.  Regardless we will plan to repeat a renal artery ultrasound tomorrow morning when she is fasting. -She is on a statin.  She is on a beta-blocker.  2.  Hypertensive crisis -Transitioned off nitro drip.  I transitioned her to Coreg instead of metoprolol.  25 twice daily. -Continue amlodipine 10 mg daily. -Hold home ARB in setting of significant kidney dysfunction -She was on Imdur 60.  We will hold his pressures are stable on Coreg and amlodipine.  We could add this back if needed.  3.  Extensive PAD/left renal artery stenosis -Plan to recheck renal artery ultrasound tomorrow when she is fasting. -Continue aspirin and statin.  4.  Hyperlipidemia -Likely will benefit from PCSK9 inhibitor evaluation in the outpatient setting.  I have added Zetia she is not at goal on Crestor 40  mg daily.  For questions or updates, please contact [place] Please consult [email or web address] for contact info under    Time Spent with Patient: I have spent a total of 25 minutes with patient reviewing hospital notes, telemetry, EKGs, labs and examining the patient as well as establishing an assessment and plan that was discussed with the patient.  > 50% of time was spent in direct patient care.    Signed, Addison Naegeli. Audie Box, MD, [place]  09/02/2020 10:25 AM

## 2020-09-03 ENCOUNTER — Inpatient Hospital Stay (HOSPITAL_COMMUNITY): Payer: Medicare Other

## 2020-09-03 ENCOUNTER — Encounter (HOSPITAL_COMMUNITY)
Admission: EM | Disposition: A | Discharge status: Home Health | Payer: Self-pay | Source: Home / Self Care | Attending: Internal Medicine

## 2020-09-03 DIAGNOSIS — I214 Non-ST elevation (NSTEMI) myocardial infarction: Secondary | ICD-10-CM | POA: Diagnosis not present

## 2020-09-03 DIAGNOSIS — R079 Chest pain, unspecified: Secondary | ICD-10-CM | POA: Diagnosis not present

## 2020-09-03 DIAGNOSIS — I701 Atherosclerosis of renal artery: Secondary | ICD-10-CM

## 2020-09-03 LAB — COMPREHENSIVE METABOLIC PANEL
ALT: 19 U/L (ref 0–44)
AST: 22 U/L (ref 15–41)
Albumin: 3.2 g/dL — ABNORMAL LOW (ref 3.5–5.0)
Alkaline Phosphatase: 58 U/L (ref 38–126)
Anion gap: 4 — ABNORMAL LOW (ref 5–15)
BUN: 56 mg/dL — ABNORMAL HIGH (ref 8–23)
CO2: 29 mmol/L (ref 22–32)
Calcium: 7.8 mg/dL — ABNORMAL LOW (ref 8.9–10.3)
Chloride: 106 mmol/L (ref 98–111)
Creatinine, Ser: 3.03 mg/dL — ABNORMAL HIGH (ref 0.44–1.00)
GFR, Estimated: 16 mL/min — ABNORMAL LOW (ref >60)
Glucose, Bld: 199 mg/dL — ABNORMAL HIGH (ref 70–99)
Potassium: 5.4 mmol/L — ABNORMAL HIGH (ref 3.5–5.1)
Sodium: 139 mmol/L (ref 135–145)
Total Bilirubin: 0.3 mg/dL (ref 0.3–1.2)
Total Protein: 6.6 g/dL (ref 6.5–8.1)

## 2020-09-03 LAB — MAGNESIUM: Magnesium: 2.1 mg/dL (ref 1.7–2.4)

## 2020-09-03 LAB — GLUCOSE, CAPILLARY
Glucose-Capillary: 172 mg/dL — ABNORMAL HIGH (ref 70–99)
Glucose-Capillary: 94 mg/dL (ref 70–99)
Glucose-Capillary: 102 mg/dL — ABNORMAL HIGH (ref 70–99)
Glucose-Capillary: 147 mg/dL — ABNORMAL HIGH (ref 70–99)

## 2020-09-03 LAB — PHOSPHORUS: Phosphorus: 5.8 mg/dL — ABNORMAL HIGH (ref 2.5–4.6)

## 2020-09-03 LAB — HEPARIN LEVEL (UNFRACTIONATED): Heparin Unfractionated: 0.42 IU/mL (ref 0.30–0.70)

## 2020-09-03 SURGERY — LEFT HEART CATH AND CORONARY ANGIOGRAPHY
Anesthesia: LOCAL

## 2020-09-03 MED ORDER — SODIUM ZIRCONIUM CYCLOSILICATE 10 G PO PACK
10.0000 g | PACK | Freq: Once | ORAL | Status: AC
  Administered 2020-09-03: 10 g via ORAL
  Filled 2020-09-03: qty 1

## 2020-09-03 MED ORDER — CARVEDILOL 12.5 MG PO TABS
12.5000 mg | ORAL_TABLET | Freq: Two times a day (BID) | ORAL | Status: DC
  Administered 2020-09-03 – 2020-09-09 (×9): 12.5 mg via ORAL
  Filled 2020-09-03 (×10): qty 1

## 2020-09-03 NOTE — Progress Notes (Signed)
SATURATION QUALIFICATIONS: (This note is used to comply with regulatory documentation for home oxygen)  Patient Saturations on Room Air at Rest = 85%  Patient Saturations on 2 Liters of oxygen while Ambulating = 95%  Please briefly explain why patient needs home oxygen: to maintain appropriate SpO2 levels.   Blondell Reveal Kistler PT 09/03/2020  Acute Rehabilitation Services Pager [phone] Office [phone]

## 2020-09-03 NOTE — Progress Notes (Signed)
PROGRESS NOTE  Molly [name] [id number] DOB: 05-17-1950 DOA: 08/31/2020 PCP: [name], MD  HPI/Recap of past 24 hours: Molly [name] is a 70 y.o. female with medical history significant of DM2, HTN, CKD3b, PAD. Presenting with left-sided nonradiating chest pain associated with dyspnea and nausea.  Work-up in the ED revealed hypertensive emergency with SBP in the 200s and elevated troponin.  She was started on heparin drip and cardiology was consulted.  Seen by cardiology.  Due to worsening renal function heart cath was postponed.  She had a VQ scan that was negative for pulmonary embolism.  Hospital course complicated by worsening AKI on CKD 3B with oliguria.  Ongoing gentle IV fluid hydration.  09/03/20: Seen and examined at bedside.  Reports persistent dyspnea and fatigue.  States she had nonradiating chest pain at rest earlier this morning centrally located associated with dyspnea lasting a few seconds.  Worsening renal function.  Nephrology consulted.   Assessment/Plan: Active Problems:   Chest pain  Chest pain rule out ACS Trop 91, 95 Cardiology following.  Plan for heart cath was delayed due to worsening renal function. 2D echo done on 08/31/2020, the left ventricle has no regional wall motion abnormality, moderate concentric LVH Reports brief chest pain earlier this morning, lasting a few seconds. Off nitroglycerin since 09/01/20 IV heparin DC'd on 09/03/2020 after 48 hours of infusion per cardiology. She is currently on aspirin 81 mg daily, Coreg 25 mg twice daily, Pletal 50 mg twice daily, Zetia 10 mg daily, Crestor 40 mg daily. Continue to monitor on telemetry Management per cardiology. Still dyspneic with generalized weakness with unclear reason.  Cardiology working on ruling out cardiac causes.  AKI precludes heart cath.  Worsening AKI on CKD 3B Cr up-trending 3.03 from 2.24 from 1.88 from 1.71 from 1.38 Avoid nephrotoxic agents and hypotension Maintain MAP  greater than 65. Oliguric.  Continue to closely monitor urine output. Nephrology consulted.  Persistent dyspnea and generalized weakness, fatigue Rule out ACS and cardiovascular disease AKI precluding heart cath. Nephrology consult to assist with the management of her AKI  Acute hypoxic respiratory failure possibly secondary to atelectasis Afebrile with no leukocytosis Nonseptic appearing Procalcitonin negative on 08/31/2020 incentive spirometer and flutter valve Mobilize as tolerated  Resolved hypertensive emergency Presented with SBP in the 200 with elevated troponin BP is currently soft Hold off Norvasc Currently on Coreg 25 mg daily Continue to monitor vital signs.  Chronic dCHF Last 2D echo 08/31/20 LVEF 50-55% G1DD Continue strict I&O Continue Daily weight Net I&O +3.3 L Management per cardiology  Acute drop hemoglobin Possibly dilutional Monitor H&H Hemoglobin 9.5 from 10.7 from 12.4. No overt bleeding.  Hyperlipidemia/GERD/neuropathy Continue home regimen  IBS with diarrhea Continue home regimen  Daytime somnolence, rule out OSA States she has been told by her husband that she snores Will need to follow-up with pulmonary outpatient for sleep study   Code Status: Full code  Family Communication: None at bedside  Disposition Plan: Likely will discharge to home when cardiology and nephrology sign off.   Consultants:  Cardiology.  Nephrology  Procedures:  2D echo  Antimicrobials:  None.  DVT prophylaxis: Subcu heparin 3 times daily  Status is: Inpatient    Dispo: The patient is from: Home               Anticipated d/c is to: Home when cardiology and nephrology sign off.               Patient currently not  stable for discharge due to ongoing management of most recent chest pain, rule out ACS.   Difficult to place patient, not applicable.         Objective: Vitals:   09/03/20 1400 09/03/20 1500 09/03/20 1600 09/03/20 1607  BP:   (!) 130/44 (!) 151/129   Pulse: 63 64 65   Resp: (!) 21 18 (!) 24   Temp:    97.9 F (36.6 C)  TempSrc:    Oral  SpO2: 91% 93% 96%   Weight:      Height:        Intake/Output Summary (Last 24 hours) at 09/03/2020 1713 Last data filed at 09/03/2020 1600 Gross per 24 hour  Intake 1827.09 ml  Output 300 ml  Net 1527.09 ml   Filed Weights   08/31/20 0650  Weight: 72.1 kg    Exam:  . General: 70 y.o. year-old female frail-appearing no acute distress.  She is somnolent but oriented x3.   . Cardiovascular: Regular rate and rhythm no rubs or gallops. Marland Kitchen Respiratory: Mild rales at bases no wheeze noted.  Poor inspiratory efforts.   . Abdomen: Soft non tender bowel sounds. .  Musculoskeletal: No lower extremity edema . Skin: No ulcerative lesions noted . Psychiatry: Mood is appropriate for condition and setting   Data Reviewed: CBC: Recent Labs  Lab 08/31/20 0720 09/01/20 0042 09/02/20 0257  WBC 8.9 8.5 5.8  HGB 12.4 10.7* 9.5*  HCT 40.2 35.1* 31.9*  MCV 88.4 89.3 91.4  PLT 250 234 [id number]   Basic Metabolic Panel: Recent Labs  Lab 08/31/20 0720 09/01/20 0042 09/01/20 1552 09/02/20 0257 09/03/20 0302  NA 139 139 138 135 139  K 4.5 4.4 4.3 4.9 5.4*  CL 104 105 106 108 106  CO2 '26 26 23 24 29  '$ GLUCOSE 164* 170* 149* 187* 199*  BUN 32* 32* 38* 46* 56*  CREATININE 1.38* 1.71* 1.88* 2.24* 3.03*  CALCIUM 9.3 8.6* 8.2* 7.8* 7.8*  MG  --   --   --   --  2.1  PHOS  --   --   --   --  5.8*   GFR: Estimated Creatinine Clearance: 16.5 mL/min (A) (by C-G formula based on SCr of 3.03 mg/dL (H)). Liver Function Tests: Recent Labs  Lab 09/03/20 0302  AST 22  ALT 19  ALKPHOS 58  BILITOT 0.3  PROT 6.6  ALBUMIN 3.2*   No results for input(s): LIPASE, AMYLASE in the last 168 hours. No results for input(s): AMMONIA in the last 168 hours. Coagulation Profile: No results for input(s): INR, PROTIME in the last 168 hours. Cardiac Enzymes: No results for input(s): CKTOTAL,  CKMB, CKMBINDEX, TROPONINI in the last 168 hours. BNP (last 3 results) No results for input(s): PROBNP in the last 8760 hours. HbA1C: Recent Labs    08/31/20 1823  HGBA1C 7.0*   CBG: Recent Labs  Lab 09/02/20 1643 09/02/20 2204 09/03/20 0801 09/03/20 1216 09/03/20 1555  GLUCAP 129* 158* 172* 94 102*   Lipid Profile: No results for input(s): CHOL, HDL, LDLCALC, TRIG, CHOLHDL, LDLDIRECT in the last 72 hours. Thyroid Function Tests: Recent Labs    08/31/20 1823  TSH 2.514   Anemia Panel: No results for input(s): VITAMINB12, FOLATE, FERRITIN, TIBC, IRON, RETICCTPCT in the last 72 hours. Urine analysis:    Component Value Date/Time   COLORURINE YELLOW 06/29/2015 1636   APPEARANCEUR HAZY (A) 06/29/2015 1636   LABSPEC 1.030 06/29/2015 1636   PHURINE 5.5 06/29/2015 1636  GLUCOSEU >1000 (A) 06/29/2015 1636   HGBUR NEGATIVE 06/29/2015 1636   BILIRUBINUR NEGATIVE 06/29/2015 1636   KETONESUR NEGATIVE 06/29/2015 1636   PROTEINUR NEGATIVE 06/29/2015 1636   NITRITE NEGATIVE 06/29/2015 1636   LEUKOCYTESUR NEGATIVE 06/29/2015 1636   Sepsis Labs: '@LABRCNTIP'$ (procalcitonin:4,lacticidven:4)  ) Recent Results (from the past 240 hour(s))  SARS CORONAVIRUS 2 (TAT 6-24 HRS) Nasopharyngeal Nasopharyngeal Swab     Status: None   Collection Time: 08/31/20  9:34 AM   Specimen: Nasopharyngeal Swab  Result Value Ref Range Status   SARS Coronavirus 2 NEGATIVE NEGATIVE Final    Comment: (NOTE) SARS-CoV-2 target nucleic acids are NOT DETECTED.  The SARS-CoV-2 RNA is generally detectable in upper and lower respiratory specimens during the acute phase of infection. Negative results do not preclude SARS-CoV-2 infection, do not rule out co-infections with other pathogens, and should not be used as the sole basis for treatment or other patient management decisions. Negative results must be combined with clinical observations, patient history, and epidemiological information. The  expected result is Negative.  Fact Sheet for Patients: SugarRoll.be  Fact Sheet for Healthcare Providers: https://www.woods-mathews.com/  This test is not yet approved or cleared by the [place] FDA and  has been authorized for detection and/or diagnosis of SARS-CoV-2 by FDA under an Emergency Use Authorization (EUA). This EUA will remain  in effect (meaning this test can be used) for the duration of the COVID-19 declaration under Se ction 564(b)(1) of the Act, 21 U.S.C. section 360bbb-3(b)(1), unless the authorization is terminated or revoked sooner.  Performed at [facility], [place] [address], [place], [place] [address]   MRSA PCR Screening     Status: None   Collection Time: 08/31/20  6:30 PM   Specimen: Nasopharyngeal  Result Value Ref Range Status   MRSA by PCR NEGATIVE NEGATIVE Final    Comment:        The GeneXpert MRSA Assay (FDA approved for NASAL specimens only), is one component of a comprehensive MRSA colonization surveillance program. It is not intended to diagnose MRSA infection nor to guide or monitor treatment for MRSA infections. Performed at [facility], [place] [address] [place], [place] [address]       Studies: VAS US RENAL ARTERY DUPLEX  Result Date: 09/03/2020 ABDOMINAL VISCERAL Patient Name:  ALANIS BENAVENTE  Date of Exam:   09/03/2020 Medical Rec #: [id number]     Accession #:    [id number] Date of Birth: 07/26/1950     Patient Gender: F Patient Age:   70Y Exam Location:  [facility] Procedure:      VAS US RENAL ARTERY DUPLEX Referring Phys: [id number] Cassie Freer O'NEAL -------------------------------------------------------------------------------- Indications: 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 Limitations: Air/bowel gas and obesity. Comparison Study: 08/31/2020 renal artery duplex- limited diagnostic quality  Performing Technologist: Maudry Mayhew MHA, RDMS, RVT, RDCS  Examination Guidelines: A complete evaluation includes B-mode imaging, spectral Doppler, color Doppler, and power Doppler as needed of all accessible portions of each vessel. Bilateral testing is considered an integral part of a complete examination. Limited examinations for reoccurring indications may be performed as noted.  Duplex Findings: +----------+--------+--------+------+--------+ MesentericPSV cm/sEDV cm/sPlaqueComments +----------+--------+--------+------+--------+ Aorta Prox   45      13                  +----------+--------+--------+------+--------+    +------------------+--------+--------+------------------+ Right Renal ArteryPSV cm/sEDV cm/s     Comment       +------------------+--------+--------+------------------+  Origin                            Unable to insonate +------------------+--------+--------+------------------+ Proximal            110      19                      +------------------+--------+--------+------------------+ Mid                 140      21                      +------------------+--------+--------+------------------+ Distal               66      8                       +------------------+--------+--------+------------------+ +-----------------+--------+--------+------------------+ Left Renal ArteryPSV cm/sEDV cm/s     Comment       +-----------------+--------+--------+------------------+ Origin              46      11                      +-----------------+--------+--------+------------------+ Proximal                         Unable to insonate +-----------------+--------+--------+------------------+ Mid                 39      9                       +-----------------+--------+--------+------------------+ Distal              45      12                      +-----------------+--------+--------+------------------+  +------------+--------+--------+----+-----------+--------+--------+----+ Right KidneyPSV cm/sEDV cm/sRI  Left KidneyPSV cm/sEDV cm/sRI   +------------+--------+--------+----+-----------+--------+--------+----+ Upper Pole  11      5       0.54Upper Pole 16      5       0.65 +------------+--------+--------+----+-----------+--------+--------+----+ Mid         10      4       0.58Mid        13      4       0.65 +------------+--------+--------+----+-----------+--------+--------+----+ Lower Pole  8       3       0.63Lower Pole                      +------------+--------+--------+----+-----------+--------+--------+----+ Hilar       21      3       0.87Hilar      23      5       0.79 +------------+--------+--------+----+-----------+--------+--------+----+ +------------------+---------+------------------+------------------+ Right Kidney               Left Kidney                          +------------------+---------+------------------+------------------+ RAR                        RAR                                  +------------------+---------+------------------+------------------+  RAR (manual)      3.11     RAR (manual)      1.02               +------------------+---------+------------------+------------------+ Cortex            12/3 cm/sCortex            Unable to insonate +------------------+---------+------------------+------------------+ Cortex thickness           Corex thickness                      +------------------+---------+------------------+------------------+ Kidney length (cm)11.20    Kidney length (cm)8.91               +------------------+---------+------------------+------------------+   Summary: Renal:  Right: Upper range 1-59% stenosis of the right renal artery by        velocities of visualized segments. RRV flow present. Left:  No evidence of hemodynamically significant left renal artery        stenosis. LRV flow present.  Abnormal size for the left        kidney.  *See table(s) above for measurements and observations.     Preliminary     Scheduled Meds: . aspirin  81 mg Oral Daily  . carvedilol  12.5 mg Oral BID WC  . cetirizine  5 mg Oral QHS  . Chlorhexidine Gluconate Cloth  6 each Topical Daily  . cholecalciferol  2,000 Units Oral Daily  . cilostazol  50 mg Oral BID  . ezetimibe  10 mg Oral Daily  . famotidine  20 mg Oral Daily  . gabapentin  100 mg Oral TID  . insulin aspart  0-5 Units Subcutaneous QHS  . insulin aspart  0-9 Units Subcutaneous TID WC  . lipase/protease/amylase  72,000 Units Oral TID with meals  . mouth rinse  15 mL Mouth Rinse BID  . rosuvastatin  40 mg Oral Daily  . sodium chloride flush  3 mL Intravenous Q12H    Continuous Infusions: . sodium chloride    . sodium chloride 50 mL/hr at 09/03/20 1600     LOS: 3 days     Kayleen Memos, MD [facility] Pager [phone]  If 7PM-7AM, please contact night-coverage [email or web address] Password [email or web address] 09/03/2020, 5:13 PM

## 2020-09-03 NOTE — Progress Notes (Signed)
ANTICOAGULATION CONSULT NOTE - Follow Up Consult  Pharmacy Consult for IV heparin Indication: chest pain/ACS  Allergies  Allergen Reactions  . Mercury Nausea And Vomiting    Patient Measurements: Height: '5\' 2"'$  (157.5 cm) Weight: 72.1 kg (159 lb) IBW/kg (Calculated) : 50.1 Heparin Dosing Weight: 66 kg  Vital Signs: Temp: 98.6 F (37 C) (05/20 0400) Temp Source: Oral (05/20 0400) BP: 128/48 (05/20 0400) Pulse Rate: 65 (05/20 0400)  Labs: Recent Labs    08/31/20 0720 08/31/20 0934 08/31/20 1823 09/01/20 0042 09/01/20 1552 09/02/20 0257 09/03/20 0302  HGB 12.4  --   --  10.7*  --  9.5*  --   HCT 40.2  --   --  35.1*  --  31.9*  --   PLT 250  --   --  234  --  203  --   HEPARINUNFRC  --   --    < > 0.59  --  0.42 0.42  CREATININE 1.38*  --   --  1.71* 1.88* 2.24* 3.03*  TROPONINIHS 91* 95*  --   --   --   --   --    < > = values in this interval not displayed.    Estimated Creatinine Clearance: 16.5 mL/min (A) (by C-G formula based on SCr of 3.03 mg/dL (H)).  Assessment: 65 yoF with PMH DM2, HTN, CKD3, PAD, presents with chest pain and dyspnea. High sens trop and D-dimer minimally elevated, but chest pain significantly improved with NTG and Pharmacy consulted for IV heparin for ACS r/o. Patient to also undergo VQ scan to r/o PE  Today, 09/03/2020:  Daily heparin level stable at goal on 800 units/hr  CBC: Hgb continues to drop but no noted bleeding; Plts WNL (5/19)  CKD3; SCr ~1.4 at baseline, now acutely elevated and rising  No bleeding or infusion issues per RN  Cardiology would like to pursue cath but continues to be delayed d/t AKI  Goal of Therapy: Heparin level 0.3-0.7 units/ml Monitor platelets by anticoagulation protocol: Yes  Plan:  Continue Heparin 800 units/hr IV infusion  Daily CBC, daily heparin level   Monitor for signs of bleeding or thrombosis  Dolly Rias RPh 09/03/2020, 6:03 AM

## 2020-09-03 NOTE — Evaluation (Signed)
Physical Therapy Evaluation Patient Details Name: Molly [name] MRN: [id number] DOB: 04-Feb-1951 Today's Date: 09/03/2020   History of Present Illness  70 y.o. female presented with left-sided nonradiating chest pain associated with dyspnea and nausea.  Work-up in the ED revealed hypertensive emergency with SBP in the 200s and elevated troponin. Dx of possible NSTEMI, AKI. Pt with medical history significant of DM2, HTN, CKD3b, PAD.  Clinical Impression  Pt admitted with above diagnosis. Pt ambulated 15' + 20' with hand held assist of 1, no loss of balance. SaO2 85% on room air at rest, 95% on 2L O2 walking. Good progress expected. Pt currently with functional limitations due to the deficits listed below (see PT Problem List). Pt will benefit from skilled PT to increase their independence and safety with mobility to allow discharge to the venue listed below.       Follow Up Recommendations No PT follow up    Equipment Recommendations  Rolling walker with 5" wheels    Recommendations for Other Services       Precautions / Restrictions Precautions Precautions: Other (comment) Precaution Comments: monitor O2 Restrictions Weight Bearing Restrictions: No      Mobility  Bed Mobility Overal bed mobility: Modified Independent             General bed mobility comments: used rail    Transfers Overall transfer level: Needs assistance Equipment used: Rolling walker (2 wheeled) Transfers: Sit to/from Stand Sit to Stand: Min guard         General transfer comment: SaO2 85% on RA at rest, 97% on 2L walking  Ambulation/Gait Ambulation/Gait assistance: Min guard Gait Distance (Feet): 35 Feet Assistive device: 1 person hand held assist Gait Pattern/deviations: Step-through pattern;Decreased stride length Gait velocity: decr   General Gait Details: SaO2 95% on 2L O2 walking, min HHA, no loss of balance  Stairs            Wheelchair Mobility    Modified Rankin (Stroke  Patients Only)       Balance Overall balance assessment: Needs assistance   Sitting balance-Leahy Scale: Good       Standing balance-Leahy Scale: Fair                               Pertinent Vitals/Pain Pain Assessment: No/denies pain    Home Living Family/patient expects to be discharged to:: Private residence Living Arrangements: Spouse/significant other Available Help at Discharge: Family;Available 24 hours/day   Home Access: Stairs to enter Entrance Stairs-Rails: Right;Left Entrance Stairs-Number of Steps: 2 Home Layout: One level Home Equipment: Bedside commode;Shower seat;Grab bars - tub/shower      Prior Function Level of Independence: Independent         Comments: no falls in past 1 year     Hand Dominance        Extremity/Trunk Assessment   Upper Extremity Assessment Upper Extremity Assessment: Overall WFL for tasks assessed    Lower Extremity Assessment Lower Extremity Assessment: Defer to PT evaluation    Cervical / Trunk Assessment Cervical / Trunk Assessment: Normal  Communication   Communication: No difficulties  Cognition Arousal/Alertness: Awake/alert Behavior During Therapy: WFL for tasks assessed/performed Overall Cognitive Status: Within Functional Limits for tasks assessed                                 General Comments: initially drowsy from  poor sleep but became awake and alert.      General Comments      Exercises     Assessment/Plan    PT Assessment Patient needs continued PT services  PT Problem List Decreased activity tolerance;Cardiopulmonary status limiting activity       PT Treatment Interventions Gait training;Therapeutic exercise;Patient/family education;Functional mobility training    PT Goals (Current goals can be found in the Care Plan section)  Acute Rehab PT Goals Patient Stated Goal: crocheting PT Goal Formulation: With patient/family Time For Goal Achievement:  09/17/20 Potential to Achieve Goals: Good    Frequency Min 3X/week   Barriers to discharge        Co-evaluation PT/OT/SLP Co-Evaluation/Treatment: Yes Reason for Co-Treatment: For patient/therapist safety PT goals addressed during session: Mobility/safety with mobility;Balance         AM-PAC PT "6 Clicks" Mobility  Outcome Measure Help needed turning from your back to your side while in a flat bed without using bedrails?: None Help needed moving from lying on your back to sitting on the side of a flat bed without using bedrails?: A Little Help needed moving to and from a bed to a chair (including a wheelchair)?: None Help needed standing up from a chair using your arms (e.g., wheelchair or bedside chair)?: None Help needed to walk in hospital room?: A Little Help needed climbing 3-5 steps with a railing? : A Little 6 Click Score: 21    End of Session Equipment Utilized During Treatment: Gait belt Activity Tolerance: Patient tolerated treatment well;No increased pain Patient left: in chair;with call bell/phone within reach;with chair alarm set;with family/visitor present Nurse Communication: Mobility status PT Visit Diagnosis: Difficulty in walking, not elsewhere classified (R26.2)    Time: [id number] PT Time Calculation (min) (ACUTE ONLY): 24 min   Charges:   PT Evaluation $PT Eval Moderate Complexity: 1 Mod        Philomena Doheny PT 09/03/2020  Acute Rehabilitation Services Pager [phone] Office [phone]

## 2020-09-03 NOTE — Evaluation (Signed)
Occupational Therapy Evaluation Patient Details Name: Molly [name] MRN: [id number] DOB: Aug 16, 1950 Today's Date: 09/03/2020    History of Present Illness 70 y.o. female presented with left-sided nonradiating chest pain associated with dyspnea and nausea.  Work-up in the ED revealed hypertensive emergency with SBP in the 200s and elevated troponin. Dx of possible NSTEMI, AKI. Pt with medical history significant of DM2, HTN, CKD3b, PAD.   Clinical Impression   From a functional standpoint patient is able to perform ADLs and perform mobility without DME. Patient required min guard/supervision due to lines/leads and oxygen. Patient able to don socks, perform toileting and manage clothing without physical assistance. Oxygen did drop to 85% on RA and patient needing 2 L [place]. No OT needs at this time.    Follow Up Recommendations  No OT follow up    Equipment Recommendations  None recommended by OT    Recommendations for Other Services       Precautions / Restrictions Precautions Precautions: Other (comment) Precaution Comments: monitor O2 Restrictions Weight Bearing Restrictions: No      Mobility Bed Mobility Overal bed mobility: Modified Independent             General bed mobility comments: used rail    Transfers Overall transfer level: Needs assistance Equipment used: Rolling walker (2 wheeled) Transfers: Sit to/from Stand Sit to Stand: Min guard         General transfer comment: SaO2 85% on RA at rest, 97% on 2L walking    Balance Overall balance assessment: No apparent balance deficits (not formally assessed)   Sitting balance-Leahy Scale: Good       Standing balance-Leahy Scale: Fair                             ADL either performed or assessed with clinical judgement   ADL                                         General ADL Comments: Patient overall supervision/min guard for ADLs due to safety and lines/leads. No physical  assistance required. Able to don socks, toilet and manage clothing.     Vision   Vision Assessment?: No apparent visual deficits     Perception     Praxis      Pertinent Vitals/Pain Pain Assessment: No/denies pain     Hand Dominance     Extremity/Trunk Assessment Upper Extremity Assessment Upper Extremity Assessment: Overall WFL for tasks assessed   Lower Extremity Assessment Lower Extremity Assessment: Defer to PT evaluation   Cervical / Trunk Assessment Cervical / Trunk Assessment: Normal   Communication Communication Communication: No difficulties   Cognition Arousal/Alertness: Awake/alert Behavior During Therapy: WFL for tasks assessed/performed Overall Cognitive Status: Within Functional Limits for tasks assessed                                 General Comments: initially drowsy from poor sleep but became awake and alert.   General Comments       Exercises     Shoulder Instructions      Home Living Family/patient expects to be discharged to:: Private residence Living Arrangements: Spouse/significant other Available Help at Discharge: Family;Available 24 hours/day   Home Access: Stairs to enter Entrance Stairs-Number of Steps: 2 Entrance  Stairs-Rails: Right;Left Home Layout: One level     Bathroom Shower/Tub: Teacher, early years/pre: Standard     Home Equipment: Bedside commode;Shower seat;Grab bars - tub/shower          Prior Functioning/Environment Level of Independence: Independent        Comments: no falls in past 1 year        OT Problem List: Cardiopulmonary status limiting activity      OT Treatment/Interventions:      OT Goals(Current goals can be found in the care plan section) Acute Rehab OT Goals Patient Stated Goal: to go home and sleep in her bed OT Goal Formulation: All assessment and education complete, DC therapy  OT Frequency:     Barriers to D/C:            Co-evaluation   Reason  for Co-Treatment: For patient/therapist safety PT goals addressed during session: Mobility/safety with mobility;Balance        AM-PAC OT "6 Clicks" Daily Activity     Outcome Measure Help from another person eating meals?: None Help from another person taking care of personal grooming?: None Help from another person toileting, which includes using toliet, bedpan, or urinal?: None Help from another person bathing (including washing, rinsing, drying)?: None Help from another person to put on and taking off regular upper body clothing?: None Help from another person to put on and taking off regular lower body clothing?: None 6 Click Score: 24   End of Session Nurse Communication:  (okay to see per Rn)  Activity Tolerance: Patient tolerated treatment well Patient left: in chair;with call bell/phone within reach;with chair alarm set;with family/visitor present  OT Visit Diagnosis: Muscle weakness (generalized) (M62.81)                Time: [id number] OT Time Calculation (min): 24 min Charges:  OT General Charges $OT Visit: 1 Visit OT Evaluation $OT Eval Low Complexity: 1 Low  Tell Rozelle, OTR/L [place]  Office [phone] Pager: [place] 09/03/2020, 1:56 PM

## 2020-09-03 NOTE — Progress Notes (Signed)
Cardiology Progress Note  Patient ID: Molly [name] MRN: [id number] DOB: Jun 20, 1950 Date of Encounter: 09/03/2020  Primary Cardiologist: Jenne Campus, MD  Subjective   Chief Complaint: Shortness of breath  HPI: Shortness of breath is not improved.  Creatinine continues to rise.  Unable to pursue left heart catheterization today.  ROS:  All other ROS reviewed and negative. Pertinent positives noted in the HPI.     Inpatient Medications  Scheduled Meds: . aspirin  81 mg Oral Daily  . carvedilol  25 mg Oral BID WC  . cetirizine  5 mg Oral QHS  . Chlorhexidine Gluconate Cloth  6 each Topical Daily  . cholecalciferol  2,000 Units Oral Daily  . cilostazol  50 mg Oral BID  . ezetimibe  10 mg Oral Daily  . famotidine  20 mg Oral Daily  . gabapentin  100 mg Oral TID  . insulin aspart  0-5 Units Subcutaneous QHS  . insulin aspart  0-9 Units Subcutaneous TID WC  . lipase/protease/amylase  72,000 Units Oral TID with meals  . mouth rinse  15 mL Mouth Rinse BID  . rosuvastatin  40 mg Oral Daily  . sodium chloride flush  3 mL Intravenous Q12H   Continuous Infusions: . sodium chloride    . sodium chloride 50 mL/hr at 09/02/20 2111  . heparin 800 Units/hr (09/02/20 2330)   PRN Meds: sodium chloride, acetaminophen, ALPRAZolam, diphenhydrAMINE, hydrALAZINE, loperamide, nitroGLYCERIN, ondansetron (ZOFRAN) IV, sodium chloride flush   Vital Signs   Vitals:   09/03/20 0500 09/03/20 0600 09/03/20 0700 09/03/20 0745  BP: (!) 87/50 92/62 137/61   Pulse: 66 65 82   Resp: 20 15 (!) 24   Temp:    98.4 F (36.9 C)  TempSrc:    Oral  SpO2: 93% 91% 94%   Weight:      Height:        Intake/Output Summary (Last 24 hours) at 09/03/2020 0859 Last data filed at 09/03/2020 0649 Gross per 24 hour  Intake 1853.41 ml  Output 300 ml  Net 1553.41 ml   Last 3 Weights 08/31/2020 06/03/2020 05/18/2020  Weight (lbs) 159 lb 157 lb 9.6 oz 155 lb  Weight (kg) 72.122 kg 71.487 kg 70.308 kg       Telemetry  Overnight telemetry shows sinus rhythm in the 60, which I personally reviewed.   Physical Exam   Vitals:   09/03/20 0500 09/03/20 0600 09/03/20 0700 09/03/20 0745  BP: (!) 87/50 92/62 137/61   Pulse: 66 65 82   Resp: 20 15 (!) 24   Temp:    98.4 F (36.9 C)  TempSrc:    Oral  SpO2: 93% 91% 94%   Weight:      Height:         Intake/Output Summary (Last 24 hours) at 09/03/2020 0859 Last data filed at 09/03/2020 0649 Gross per 24 hour  Intake 1853.41 ml  Output 300 ml  Net 1553.41 ml    Last 3 Weights 08/31/2020 06/03/2020 05/18/2020  Weight (lbs) 159 lb 157 lb 9.6 oz 155 lb  Weight (kg) 72.122 kg 71.487 kg 70.308 kg    Body mass index is 29.08 kg/m.  General: Well nourished, well developed, in no acute distress Head: Atraumatic, normal size  Eyes: PEERLA, EOMI  Neck: Supple, no JVD Endocrine: No thryomegaly Cardiac: Normal S1, S2; RRR; no murmurs, rubs, or gallops Lungs: Diminished breath sounds bilaterally Abd: Soft, nontender, no hepatomegaly  Ext: No edema, diminished pulses bilateral Musculoskeletal: No deformities,  BUE and BLE strength normal and equal Skin: Warm and dry, no rashes   Neuro: Alert and oriented to person, place, time, and situation, CNII-XII grossly intact, no focal deficits  Psych: Normal mood and affect   Labs  High Sensitivity Troponin:   Recent Labs  Lab 08/31/20 0720 08/31/20 0934  TROPONINIHS 91* 95*     Cardiac EnzymesNo results for input(s): TROPONINI in the last 168 hours. No results for input(s): TROPIPOC in the last 168 hours.  Chemistry Recent Labs  Lab 09/01/20 1552 09/02/20 0257 09/03/20 0302  NA 138 135 139  K 4.3 4.9 5.4*  CL 106 108 106  CO2 '23 24 29  '$ GLUCOSE 149* 187* 199*  BUN 38* 46* 56*  CREATININE 1.88* 2.24* 3.03*  CALCIUM 8.2* 7.8* 7.8*  PROT  --   --  6.6  ALBUMIN  --   --  3.2*  AST  --   --  22  ALT  --   --  19  ALKPHOS  --   --  58  BILITOT  --   --  0.3  GFRNONAA 29* 23* 16*  ANIONGAP 9  3* 4*    Hematology Recent Labs  Lab 08/31/20 0720 09/01/20 0042 09/02/20 0257  WBC 8.9 8.5 5.8  RBC 4.55 3.93 3.49*  HGB 12.4 10.7* 9.5*  HCT 40.2 35.1* 31.9*  MCV 88.4 89.3 91.4  MCH 27.3 27.2 27.2  MCHC 30.8 30.5 29.8*  RDW 14.4 14.2 14.6  PLT 250 234 203   BNP Recent Labs  Lab 08/31/20 0720  BNP 421.0*    DDimer  Recent Labs  Lab 08/31/20 0720  DDIMER 0.83*     Radiology  No results found.  Cardiac Studies  TTE 08/31/2020 1. Left ventricular ejection fraction, by estimation, is 50 to 55%. Left  ventricular ejection fraction by 3D volume is 51 %. The left ventricle has  low normal function. The left ventricle has no regional wall motion  abnormalities. There is moderate  concentric left ventricular hypertrophy. Left ventricular diastolic  parameters are consistent with Grade I diastolic dysfunction (impaired  relaxation).  2. Right ventricular systolic function is normal. The right ventricular  size is normal.  3. The pericardial effusion is circumferential. There is no evidence of  cardiac tamponade.  4. The mitral valve is grossly normal. Trivial mitral valve  regurgitation. No evidence of mitral stenosis.  5. The aortic valve is tricuspid. There is mild calcification of the  aortic valve. There is mild thickening of the aortic valve. Aortic valve  regurgitation is not visualized. No aortic stenosis is present.  6. The inferior vena cava is normal in size with greater than 50%  respiratory variability, suggesting right atrial pressure of 3 mmHg.   Patient Profile  70 year old female history of diabetes, hypertension, severe PAD, CKD stage III, hyperlipidemia who was admitted on 08/31/2020 with accelerated hypertension and elevated troponin. Course has been complicated by AKI that is delayed for left heart catheterization.  Assessment & Plan   1.  Accelerated hypertension/elevated troponin concerning for non-STEMI/acute hypoxic respiratory  failure/elevated BNP -Admitted with severely elevated blood pressure.  BPs were in the 200 range.  BNP was elevated.  Chest CT with possible pneumonia.  No white count.  Procalcitonin is negative.  No fevers. -Does not appear to be infectious. -She did report chest pain and troponins are minimally elevated and flat.  Echocardiogram shows normal LV function and no wall motion normalities.  No further chest pain. -She  remains hypoxic.  Chest CT without overt pulmonary edema.  Exam inconsistent with this. -I am concerned she may have multivessel CAD.  She may have become transiently hypoxic in the setting of flash pulmonary edema that resolved. -We have plans to pursue left heart catheterization however she has now suffered an AKI.  This will delay things. -She did have 90% left renal artery stenosis only angiogram last year.  Initial renal artery ultrasounds were nondiagnostic.  We have repeated them today.  I would like to make sure she does not have any evidence of bilateral renal artery stenosis.  It appears the right renal artery was normal last year.  I do not think this was a hypertensive crisis in the setting of renal artery stenosis that she would have to have bilateral disease.  We need to exclude this. -Heart catheterization will be important to her diagnosis.  We will get an idea of filling pressures.  On exam she does not appear volume overloaded.  Again this is delayed. -Would continue antihypertensive agents.  She can go to a regular floor. -She has completed 48 hours of heparin.  We will stop this.  She should continue her aspirin and beta-blocker.  She is on a high intensity statin.  Zetia has been added.  2.  AKI -Suspect this is related to hypertensive crisis.  BP is better off nitro drip. -Given her creatinine is rising rapidly it may not be a bad idea to have nephrology see her. -She has had very little urine output.  3.  Hypertensive crisis -BP much improved on Coreg and  hydralazine.  May need to add Imdur.  Cautious with nephrotoxic agents.  4.  Severe PAD/left renal artery stenosis -Continue aspirin and statin.  Plans to repeat renal artery ultrasound today.  5.  Hyperlipidemia -Zetia added.  Likely should be considered for PCSK9 inhibitor as an outpatient.  For questions or updates, please contact [place] Please consult [email or web address] for contact info under   Time Spent with Patient: I have spent a total of 25 minutes with patient reviewing hospital notes, telemetry, EKGs, labs and examining the patient as well as establishing an assessment and plan that was discussed with the patient.  > 50% of time was spent in direct patient care.    Signed, Addison Naegeli. Audie Box, MD, [place]  09/03/2020 8:59 AM

## 2020-09-03 NOTE — Consult Note (Signed)
Nephrology Consult   Requesting provider: Irene Pap DO Service requesting consult: Hospitalist Reason for consult: AKI on CKD 3b   Assessment/Recommendations: Molly [name] is a/an 70 y.o. female with a past medical history DM2, HTN, CKD 3b (BL 1.1-1.4), PAD who present w/ NSTEMI, htn, and AKI  Oliguric AKI on CKD 3b: BL around 1.1-1.4. Likely secondary to overcorrection of blood pressure with intermittent hypotension. It appears BP has been up and down so likely has been hard to maintain at a consistent level. UA on admission with only glucose. Renal artery PVL shows relatively small left kidney (9cm compared to 11.2 on right) but no left RAS -Repeat UA -agree with gentle IV hydration; monitor for volume overload -Continue to monitor daily Cr, Dose meds for GFR -Monitor Daily I/Os, Daily weight  -Maintain MAP>65 for optimal renal perfusion.  -Avoid nephrotoxic medications including NSAIDs and Vanc/Zosyn combo -Currently no indication for HD but we discussed this may become necessary during this hospitalization  Hypertension: Admitted with hypertensive emergency with hypotension intermittently.  Decrease Coreg to 12.5 mg twice daily and monitor closely.  Would like to keep systolic blood pressure around  130-170 unless higher blood pressure exacerbates cardiac symptoms.  Difficult situation  Anemia: Hemoglobin 9.5 decreased from 12.4 on admission.  Possibly some dilutional component but hypertension may have also caused mild hemolysis.  Continue to monitor  DM2: Management per primary team  Hyperkalemia: Potassium 5.3.  Agree with [place] today.   Recommendations conveyed to primary service.    [facility] 09/03/2020 6:59 PM   _____________________________________________________________________________________ CC: AKI on CKD  History of Present Illness: Molly [name] is a/an 70 y.o. female with a past medical history of DM2, HTN, CKD 3b (BL  1.1-1.4), PAD who presents with chest pain.  Patient does seem slightly confused but her husband is at bedside and they both feel that she is not confused at this time.  The patient states that she was getting ready to go to work on Tuesday when she felt like she could not do what she needed to do at home.  She felt chest pain and shortness of breath as well as fatigue.  This was new from anything she had had in the past.  In the emergency department she was noted to have left-sided nonradiating chest pain as well as shortness of breath and nausea.  She was noted to be markedly hypertensive with systolic blood pressures up to around 225.  She was treated with nitroglycerin because of her chest pain and hypertension.  Her blood pressure did improve but did have some drop in blood pressure as low as 115/35 within 10 hours of presentation.  Blood pressure has continued to be intermittently low since admission.  A blood pressure today was even in the [id number] systolic.  It is been noted that the patient's creatinine has increased since admission up to 3 today.  The patient has noted that she has decreased urine output.  Currently she does not have any chest pain or shortness of breath.  She denies nausea or vomiting.  She states that she has chronic diarrhea at home.  She denies NSAID use.  No hematuria.  No history of kidney stones.  Had planned on seeing a kidney doctor outpatient but has not made it to their office yet.  The patient was noted to have intermittent jerking but states this has been going on for several months.  Medications:  Current Facility-Administered Medications  Medication Dose  Route Frequency Provider Last Rate Last Admin  . 0.9 %  sodium chloride infusion  250 mL Intravenous PRN Barrett, Rhonda G, PA-C      . 0.9 %  sodium chloride infusion   Intravenous Continuous Barrett, Rhonda G, PA-C 50 mL/hr at 09/03/20 1800 Infusion Verify at 09/03/20 1800  . acetaminophen (TYLENOL) tablet 650 mg   650 mg Oral Q4H PRN Marylyn Ishihara, Tyrone A, DO      . ALPRAZolam Duanne Moron) tablet 0.25 mg  0.25 mg Oral BID PRN Marylyn Ishihara, Tyrone A, DO      . aspirin chewable tablet 81 mg  81 mg Oral Daily Geralynn Rile, MD   81 mg at 09/03/20 [id number]  . carvedilol (COREG) tablet 12.5 mg  12.5 mg Oral BID WC Reesa Chew, MD   12.5 mg at 09/03/20 1747  . cetirizine (ZYRTEC) tablet 5 mg  5 mg Oral QHS Kyle, Tyrone A, DO   5 mg at 09/02/20 2214  . Chlorhexidine Gluconate Cloth 2 % PADS 6 each  6 each Topical Daily Kyle, Tyrone A, DO   6 each at 09/03/20 [phone]  . cholecalciferol (VITAMIN D3) tablet 2,000 Units  2,000 Units Oral Daily Cherylann Ratel A, DO   2,000 Units at 09/03/20 [id number]  . cilostazol (PLETAL) tablet 50 mg  50 mg Oral BID Lenis Noon, RPH   50 mg at 09/03/20 [id number]  . diphenhydrAMINE (BENADRYL) 12.5 MG/5ML elixir 12.5 mg  12.5 mg Oral Daily PRN Marylyn Ishihara, Tyrone A, DO      . ezetimibe (ZETIA) tablet 10 mg  10 mg Oral Daily O'Neal, Cassie Freer, MD   10 mg at 09/03/20 [id number]  . famotidine (PEPCID) tablet 20 mg  20 mg Oral Daily Kyle, Tyrone A, DO   20 mg at 09/03/20 [id number]  . gabapentin (NEURONTIN) capsule 100 mg  100 mg Oral TID Cherylann Ratel A, DO   100 mg at 09/03/20 1747  . hydrALAZINE (APRESOLINE) injection 5 mg  5 mg Intravenous Q6H PRN Irene Pap N, DO      . insulin aspart (novoLOG) injection 0-5 Units  0-5 Units Subcutaneous QHS Kyle, Tyrone A, DO      . insulin aspart (novoLOG) injection 0-9 Units  0-9 Units Subcutaneous TID WC Kyle, Tyrone A, DO   2 Units at 09/03/20 [id number]  . lipase/protease/amylase (CREON) capsule 72,000 Units  72,000 Units Oral TID with meals Marylyn Ishihara, Tyrone A, DO   72,000 Units at 09/02/20 1230  . loperamide (IMODIUM) capsule 2 mg  2 mg Oral QID PRN Marylyn Ishihara, Tyrone A, DO      . MEDLINE mouth rinse  15 mL Mouth Rinse BID Kyle, Tyrone A, DO   15 mL at 09/03/20 0941  . nitroGLYCERIN (NITROSTAT) SL tablet 0.4 mg  0.4 mg Sublingual Q5 min PRN Marylyn Ishihara, Tyrone A, DO   0.4 mg at 08/31/20 0809  .  ondansetron (ZOFRAN) injection 4 mg  4 mg Intravenous Q6H PRN Marylyn Ishihara, Tyrone A, DO      . rosuvastatin (CRESTOR) tablet 40 mg  40 mg Oral Daily O'Neal, Cassie Freer, MD   40 mg at 09/03/20 [id number]  . sodium chloride flush (NS) 0.9 % injection 3 mL  3 mL Intravenous Q12H Barrett, Rhonda G, PA-C   3 mL at 09/03/20 0942  . sodium chloride flush (NS) 0.9 % injection 3 mL  3 mL Intravenous PRN Barrett, Evelene Croon, PA-C         ALLERGIES [place]  MEDICAL HISTORY Past Medical  History:  Diagnosis Date  . Abdominal aortic atherosclerosis with stenosis 06/29/2015  . AKI (acute kidney injury) ([place]) 06/29/2015  . Atherosclerosis of coronary artery without angina pectoris 04/21/2020  . Atopic dermatitis 04/21/2020  . Bilateral impacted cerumen 08/21/2019  . Cholecystitis 06/29/2015  . Cholelithiasis 06/29/2015  . Claudication in peripheral vascular disease ([place]) 08/28/2019   Peripheral arterial disease  . Colon cancer screening 04/21/2020  . Dehydration with hyponatremia 06/29/2015  . Diabetes mellitus ([place]) 07/15/2018  . Diarrhea 04/21/2020  . DKA (diabetic ketoacidoses) 06/29/2015  . Dyslipidemia 08/30/2018  . Epigastric pain 04/21/2020  . Gastroesophageal reflux disease 04/21/2020  . HTN (hypertension) 06/29/2015  . Hyperglycemia due to type 2 diabetes mellitus ([place]) 04/21/2020  . Hypertension   . 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 Left-sided chest pain 04/21/2020  . Leukocytosis 06/29/2015  . Long term (current) use of insulin ([place]) 04/21/2020  . Loss of appetite 04/21/2020  . Nausea 04/21/2020  . Occlusion and stenosis of bilateral carotid arteries 04/21/2020  . Osteopenia 10/02/2018  . Other dysphagia 09/10/2018  . Peripheral vascular disease ([place]) 07/15/2018   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  Noncritical  . Progressive external  ophthalmoplegia of both eyes 09/10/2018  . Proptosis 04/21/2020  . Proximal leg weakness 09/10/2018  . Ptosis of left eyelid 09/10/2018  . Renal artery stenosis ([place]) 12/17/2019   Renal artery stenosis  . Standard chest x-ray abnormal 04/21/2020  . Vitamin D deficiency 04/21/2020  . Weight loss 04/21/2020     SOCIAL HISTORY Social History   Socioeconomic History  . Marital status: Married    Spouse name: Not on file  . Number of children: Not on file  . Years of education: Not on file  . Highest education level: Not on file  Occupational History  . Not on file  Tobacco Use  . Smoking status: Never Smoker  . Smokeless tobacco: Never Used  Vaping Use  . Vaping Use: Never used  Substance and Sexual Activity  . Alcohol use: No  . Drug use: Never  . Sexual activity: Not on file  Other Topics Concern  . Not on file  Social History Narrative  . Not on file   Social Determinants of Health   Financial Resource Strain: Not on file  Food Insecurity: Not on file  Transportation Needs: Not on file  Physical Activity: Not on file  Stress: Not on file  Social Connections: Not on file  Intimate Partner Violence: Not on file     FAMILY HISTORY Family History  Problem Relation Age of Onset  . Breast cancer Mother   . Hypertension Mother   . Hypertension Father   . Pancreatic cancer Other        uncle      Review of Systems: 12 systems reviewed Otherwise as per HPI, all other systems reviewed and negative  Physical Exam: Vitals:   09/03/20 1700 09/03/20 1800  BP: (!) 87/62 129/79  Pulse: 66 66  Resp: (!) 23 (!) 22  Temp:    SpO2: 95% 95%   Total I/O In: 720 [I.V.:720] Out: -   Intake/Output Summary (Last 24 hours) at 09/03/2020 1859 Last data filed at 09/03/2020 1800 Gross per 24 hour  Intake 1361.18 ml  Output 300 ml  Net 1061.18 ml   General: well-appearing, no acute distress HEENT: anicteric  sclera, oropharynx clear without lesions CV: Normal rate, regular rhythm,  3 out of 6 systolic murmur Lungs: clear to auscultation bilaterally, normal work of breathing Abd: soft, non-tender, non-distended Skin: no visible lesions or rashes Psych: alert, engaged, appropriate mood and affect Musculoskeletal: no obvious deformities Neuro: normal speech, no gross focal deficits   Test Results Reviewed Lab Results  Component Value Date   NA 139 09/03/2020   K 5.4 (H) 09/03/2020   CL 106 09/03/2020   CO2 29 09/03/2020   BUN 56 (H) 09/03/2020   CREATININE 3.03 (H) 09/03/2020   GFR 41.33 (L) 06/03/2020   CALCIUM 7.8 (L) 09/03/2020   ALBUMIN 3.2 (L) 09/03/2020   PHOS 5.8 (H) 09/03/2020     I have reviewed all relevant outside healthcare records related to the patient's current hospitalization

## 2020-09-03 NOTE — Progress Notes (Signed)
Renal artery duplex completed. Refer to "CV Proc" under chart review to view preliminary results.  09/03/2020 8:56 AM Kelby Aline., MHA, RVT, RDCS, RDMS

## 2020-09-04 ENCOUNTER — Inpatient Hospital Stay (HOSPITAL_COMMUNITY): Payer: Medicare Other

## 2020-09-04 DIAGNOSIS — R079 Chest pain, unspecified: Secondary | ICD-10-CM | POA: Diagnosis not present

## 2020-09-04 DIAGNOSIS — I214 Non-ST elevation (NSTEMI) myocardial infarction: Secondary | ICD-10-CM | POA: Diagnosis not present

## 2020-09-04 LAB — CBC
HCT: 34.7 % — ABNORMAL LOW (ref 36.0–46.0)
Hemoglobin: 10.3 g/dL — ABNORMAL LOW (ref 12.0–15.0)
MCH: 26.8 pg (ref 26.0–34.0)
MCHC: 29.7 g/dL — ABNORMAL LOW (ref 30.0–36.0)
MCV: 90.4 fL (ref 80.0–100.0)
Platelets: 256 10*3/uL (ref 150–400)
RBC: 3.84 MIL/uL — ABNORMAL LOW (ref 3.87–5.11)
RDW: 14.5 % (ref 11.5–15.5)
WBC: 5.6 10*3/uL (ref 4.0–10.5)
nRBC: 0 % (ref 0.0–0.2)

## 2020-09-04 LAB — IRON AND TIBC
Iron: 38 ug/dL (ref 28–170)
Saturation Ratios: 15 % (ref 10.4–31.8)
TIBC: 248 ug/dL — ABNORMAL LOW (ref 250–450)
UIBC: 210 ug/dL

## 2020-09-04 LAB — BASIC METABOLIC PANEL
Anion gap: 12 (ref 5–15)
BUN: 64 mg/dL — ABNORMAL HIGH (ref 8–23)
CO2: 20 mmol/L — ABNORMAL LOW (ref 22–32)
Calcium: 8.4 mg/dL — ABNORMAL LOW (ref 8.9–10.3)
Chloride: 105 mmol/L (ref 98–111)
Creatinine, Ser: 2.96 mg/dL — ABNORMAL HIGH (ref 0.44–1.00)
GFR, Estimated: 17 mL/min — ABNORMAL LOW (ref >60)
Glucose, Bld: 139 mg/dL — ABNORMAL HIGH (ref 70–99)
Potassium: 5.3 mmol/L — ABNORMAL HIGH (ref 3.5–5.1)
Sodium: 137 mmol/L (ref 135–145)

## 2020-09-04 LAB — GLUCOSE, CAPILLARY
Glucose-Capillary: 128 mg/dL — ABNORMAL HIGH (ref 70–99)
Glucose-Capillary: 178 mg/dL — ABNORMAL HIGH (ref 70–99)
Glucose-Capillary: 123 mg/dL — ABNORMAL HIGH (ref 70–99)
Glucose-Capillary: 112 mg/dL — ABNORMAL HIGH (ref 70–99)

## 2020-09-04 LAB — FERRITIN: Ferritin: 91 ng/mL (ref 11–307)

## 2020-09-04 LAB — PROTEIN / CREATININE RATIO, URINE
Creatinine, Urine: 168.06 mg/dL
Protein Creatinine Ratio: 0.68 mg/mg{Cre} — ABNORMAL HIGH (ref 0.00–0.15)
Total Protein, Urine: 115 mg/dL

## 2020-09-04 LAB — HEPARIN LEVEL (UNFRACTIONATED): Heparin Unfractionated: <0.1 IU/mL — ABNORMAL LOW (ref 0.30–0.70)

## 2020-09-04 MED ORDER — SODIUM ZIRCONIUM CYCLOSILICATE 10 G PO PACK
10.0000 g | PACK | Freq: Every day | ORAL | Status: DC
  Administered 2020-09-04: 10 g via ORAL
  Filled 2020-09-04: qty 1

## 2020-09-04 MED ORDER — HEPARIN SODIUM (PORCINE) 5000 UNIT/ML IJ SOLN
5000.0000 [IU] | Freq: Three times a day (TID) | INTRAMUSCULAR | Status: DC
  Administered 2020-09-04 – 2020-09-13 (×27): 5000 [IU] via SUBCUTANEOUS
  Filled 2020-09-04 (×26): qty 1

## 2020-09-04 MED ORDER — SODIUM BICARBONATE 650 MG PO TABS
1300.0000 mg | ORAL_TABLET | Freq: Three times a day (TID) | ORAL | Status: DC
  Administered 2020-09-04: 1300 mg via ORAL
  Filled 2020-09-04: qty 2

## 2020-09-04 NOTE — Progress Notes (Signed)
Attempted to get pt up in chair for lunch, pt refuses. She states she will get up later today.

## 2020-09-04 NOTE — Progress Notes (Signed)
PROGRESS NOTE  Molly [name] [id number] DOB: [birth year] DOA: 08/31/2020 PCP: [name], MD  HPI/Recap of past 24 hours: Molly [name] is a 70 y.o. female with medical history significant of DM2, HTN, CKD3b, PAD. Presenting with left-sided nonradiating chest pain associated with dyspnea and nausea.  Work-up in the ED revealed hypertensive emergency with SBP in the 200s and elevated troponin.  She was started on heparin drip and cardiology was consulted.  Seen by cardiology.  Due to worsening renal function heart cath was postponed.  She had a VQ scan that was negative for pulmonary embolism.  Hospital course complicated by worsening AKI on CKD 3B with oliguria.  Ongoing gentle IV fluid hydration.  09/04/20: Patient was seen and examined at bedside this morning.  She is somnolent however she is arousable and answers to questions appropriately.  She admits to dyspnea with minimal exertion.  She does have conversational dyspnea.  Seen by nephrology, recommended to allow for SBP greater than 130.  Coreg held.  Assessment/Plan: Active Problems:   Chest pain  Chest pain rule out ACS Trop 91, 95 Seen by cardiology, AKI precluded heart cath. She received 48 hours of heparin drip.  She is currently off nitroglycerin drip. 2D echo done on 08/31/2020, the left ventricle has no regional wall motion abnormality, moderate concentric LVH Management per cardiology. Aspirin 81 mg daily, Pletal 50 mg twice daily, Zetia 10 mg daily, Crestor 40 mg daily. Dose of Coreg was reduced to 12.5 mg twice daily then held to allow for SBP greater than 130 as recommended by nephrology. Continue to monitor on telemetry  AKI on CKD 3B likely contributed by hypotension. Creatinine is now downtrending with liberalization of hypertension, 2.96 from 3.03 from 2.24 from 1.88 from 1.71 from 1.38 Continue to avoid nephrotoxic agents and hypotension Continue to maintain MAP greater than 65. Renal output is  improving. Appreciate nephrology's assistance.  Hyperkalemia Continue Lokelma 10 g daily x2 days. Repeat renal panel in the morning Started on renal diet per nephrology.  Persistent dyspnea and generalized weakness, fatigue Rule out ACS and cardiovascular disease AKI precluding heart cath. Nephrology consult to assist with the management of her AKI  Acute hypoxic respiratory failure possibly secondary to atelectasis Afebrile with no leukocytosis Nonseptic appearing Procalcitonin negative on 08/31/2020 Continue incentive spirometer and flutter valve Continue to mobilize as tolerated  Resolved hypertensive emergency Presented with SBP in the 200 with elevated troponin BP is currently soft Hold off Norvasc and Coreg for now Continue to monitor vital signs.  Chronic dCHF Last 2D echo 08/31/20 LVEF 50-55% G1DD Continue strict I&O Continue Daily weight Net I&O +4.4 L Management per cardiology  Anemia of chronic disease Hemoglobin uptrending. No overt bleeding Continue to monitor H&H  Hyperlipidemia/GERD/neuropathy Continue home regimen  IBS with diarrhea Continue home regimen  Daytime somnolence, rule out OSA States she has been told by her husband that she snores She would benefit from polysomnography, will provide pulmonary referral at DC.   Code Status: Full code  Family Communication: None at bedside  Disposition Plan: Likely will discharge to home when cardiology and nephrology sign off.   Consultants:  Cardiology.  Nephrology  Procedures:  2D echo  Antimicrobials:  None.  DVT prophylaxis: Subcu heparin 3 times daily  Status is: Inpatient    Dispo: The patient is from: Home               Anticipated d/c is to: Home when cardiology and nephrology sign off.  Patient currently not stable for discharge due to ongoing management of most recent chest pain, rule out ACS.   Difficult to place patient, not applicable.          Objective: Vitals:   09/04/20 1611 09/04/20 1700 09/04/20 1753 09/04/20 1800  BP: (!) 127/104 122/75  132/60  Pulse: (!) 59 (!) 57  (!) 57  Resp: (!) 21 (!) 21  20  Temp:   98 F (36.7 C)   TempSrc:   Oral   SpO2: 94% 98%  94%  Weight:      Height:        Intake/Output Summary (Last 24 hours) at 09/04/2020 1803 Last data filed at 09/04/2020 1600 Gross per 24 hour  Intake 1404.9 ml  Output 490 ml  Net 914.9 ml   Filed Weights   08/31/20 0650  Weight: 72.1 kg    Exam:  . General: 70 y.o. year-old female frail-appearing somnolent in no acute distress.  Oriented x3.   . Cardiovascular: Regular rate and rhythm no rubs or gallops. Marland Kitchen Respiratory: Clear to auscultation no wheezes or rales.  Poor inspiratory effort.   . Abdomen: Soft nontender normal bowel sounds present. .  Musculoskeletal: No lower extremity edema bilaterally. . Skin: Old healed lesions in her lower extremities bilaterally. Marland Kitchen Psychiatry: Mood is appropriate for condition setting.   Data Reviewed: CBC: Recent Labs  Lab 08/31/20 0720 09/01/20 0042 09/02/20 0257 09/04/20 0219  WBC 8.9 8.5 5.8 5.6  HGB 12.4 10.7* 9.5* 10.3*  HCT 40.2 35.1* 31.9* 34.7*  MCV 88.4 89.3 91.4 90.4  PLT 250 234 203 [id number]   Basic Metabolic Panel: Recent Labs  Lab 09/01/20 0042 09/01/20 1552 09/02/20 0257 09/03/20 0302 09/04/20 0219  NA 139 138 135 139 137  K 4.4 4.3 4.9 5.4* 5.3*  CL 105 106 108 106 105  CO2 '26 23 24 29 '$ 20*  GLUCOSE 170* 149* 187* 199* 139*  BUN 32* 38* 46* 56* 64*  CREATININE 1.71* 1.88* 2.24* 3.03* 2.96*  CALCIUM 8.6* 8.2* 7.8* 7.8* 8.4*  MG  --   --   --  2.1  --   PHOS  --   --   --  5.8*  --    GFR: Estimated Creatinine Clearance: 16.9 mL/min (A) (by C-G formula based on SCr of 2.96 mg/dL (H)). Liver Function Tests: Recent Labs  Lab 09/03/20 0302  AST 22  ALT 19  ALKPHOS 58  BILITOT 0.3  PROT 6.6  ALBUMIN 3.2*   No results for input(s): LIPASE, AMYLASE in the last 168  hours. No results for input(s): AMMONIA in the last 168 hours. Coagulation Profile: No results for input(s): INR, PROTIME in the last 168 hours. Cardiac Enzymes: No results for input(s): CKTOTAL, CKMB, CKMBINDEX, TROPONINI in the last 168 hours. BNP (last 3 results) No results for input(s): PROBNP in the last 8760 hours. HbA1C: No results for input(s): HGBA1C in the last 72 hours. CBG: Recent Labs  Lab 09/03/20 1555 09/03/20 2032 09/04/20 0759 09/04/20 1208 09/04/20 1627  GLUCAP 102* 147* 128* 178* 123*   Lipid Profile: No results for input(s): CHOL, HDL, LDLCALC, TRIG, CHOLHDL, LDLDIRECT in the last 72 hours. Thyroid Function Tests: No results for input(s): TSH, T4TOTAL, FREET4, T3FREE, THYROIDAB in the last 72 hours. Anemia Panel: Recent Labs    09/04/20 1019  FERRITIN 91  TIBC 248*  IRON 38   Urine analysis:    Component Value Date/Time   COLORURINE YELLOW 06/29/2015 1636   APPEARANCEUR  HAZY (A) 06/29/2015 1636   LABSPEC 1.030 06/29/2015 1636   PHURINE 5.5 06/29/2015 1636   GLUCOSEU >1000 (A) 06/29/2015 1636   HGBUR NEGATIVE 06/29/2015 1636   BILIRUBINUR NEGATIVE 06/29/2015 1636   KETONESUR NEGATIVE 06/29/2015 1636   PROTEINUR NEGATIVE 06/29/2015 1636   NITRITE NEGATIVE 06/29/2015 1636   LEUKOCYTESUR NEGATIVE 06/29/2015 1636   Sepsis Labs: '@LABRCNTIP'$ (procalcitonin:4,lacticidven:4)  ) Recent Results (from the past 240 hour(s))  SARS CORONAVIRUS 2 (TAT 6-24 HRS) Nasopharyngeal Nasopharyngeal Swab     Status: None   Collection Time: 08/31/20  9:34 AM   Specimen: Nasopharyngeal Swab  Result Value Ref Range Status   SARS Coronavirus 2 NEGATIVE NEGATIVE Final    Comment: (NOTE) SARS-CoV-2 target nucleic acids are NOT DETECTED.  The SARS-CoV-2 RNA is generally detectable in upper and lower respiratory specimens during the acute phase of infection. Negative results do not preclude SARS-CoV-2 infection, do not rule out co-infections with other pathogens, and  should not be used as the sole basis for treatment or other patient management decisions. Negative results must be combined with clinical observations, patient history, and epidemiological information. The expected result is Negative.  Fact Sheet for Patients: SugarRoll.be  Fact Sheet for Healthcare Providers: https://www.woods-mathews.com/  This test is not yet approved or cleared by the [place] FDA and  has been authorized for detection and/or diagnosis of SARS-CoV-2 by FDA under an Emergency Use Authorization (EUA). This EUA will remain  in effect (meaning this test can be used) for the duration of the COVID-19 declaration under Se ction 564(b)(1) of the Act, 21 U.S.C. section 360bbb-3(b)(1), unless the authorization is terminated or revoked sooner.  Performed at [facility], [place] [address], [place], [place] [address]   MRSA PCR Screening     Status: None   Collection Time: 08/31/20  6:30 PM   Specimen: Nasopharyngeal  Result Value Ref Range Status   MRSA by PCR NEGATIVE NEGATIVE Final    Comment:        The GeneXpert MRSA Assay (FDA approved for NASAL specimens only), is one component of a comprehensive MRSA colonization surveillance program. It is not intended to diagnose MRSA infection nor to guide or monitor treatment for MRSA infections. Performed at [facility], [place] [address] [place], [place] [address]       Studies: US RENAL  Result Date: 09/04/2020 CLINICAL DATA:  Acute renal insufficiency EXAM: RENAL / URINARY TRACT ULTRASOUND COMPLETE COMPARISON:  None. FINDINGS: Right Kidney: Renal measurements: 9.8 x 4.8 x 4.7 cm = volume: 115 mL. Echogenicity within normal limits. No mass or hydronephrosis visualized. Left Kidney: Renal measurements: 9.8 x 4.5 x 4.7 cm = volume: 108 mL. Echogenicity within normal limits. No mass or hydronephrosis visualized. Bladder: Appears normal for degree of  bladder distention. Other: None. IMPRESSION: No abnormalities identified. No cause for the patient's symptoms identified. Electronically Signed   By: Dorise Bullion III M.D   On: 09/04/2020 13:39    Scheduled Meds: . aspirin  81 mg Oral Daily  . carvedilol  12.5 mg Oral BID WC  . cetirizine  5 mg Oral QHS  . Chlorhexidine Gluconate Cloth  6 each Topical Daily  . cholecalciferol  2,000 Units Oral Daily  . cilostazol  50 mg Oral BID  . ezetimibe  10 mg Oral Daily  . famotidine  20 mg Oral Daily  . gabapentin  100 mg Oral TID  . heparin injection (subcutaneous)  5,000 Units Subcutaneous Q8H  . insulin aspart  0-5 Units Subcutaneous QHS  . insulin aspart  0-9 Units Subcutaneous TID WC  . lipase/protease/amylase  72,000 Units Oral TID with meals  . mouth rinse  15 mL Mouth Rinse BID  . rosuvastatin  40 mg Oral Daily  . sodium chloride flush  3 mL Intravenous Q12H  . sodium zirconium cyclosilicate  10 g Oral Daily    Continuous Infusions: . sodium chloride    . sodium chloride 50 mL/hr at 09/04/20 1600     LOS: 4 days     Kayleen Memos, MD [facility] Pager [phone]  If 7PM-7AM, please contact night-coverage [email or web address] Password [facility] 09/04/2020, 6:03 PM

## 2020-09-04 NOTE — Progress Notes (Signed)
[facility] Progress Note  Name: Molly [name] MRN: [id number] DOB: 1951-02-02   Subjective:  Had 350 ml uop over 5/20 charted - per RN this was what she had urinated at shift change.  Strict ins/outs not ordered.  Has been on NS at 50 ml/hr most recently.  She states not on oxygen at home.  Has been on 2.5 liters here and when dozes off sats have dropped per RN  Review of systems:  Denies overt shortness of breath; but new oxygen requirement  No chest pain  No n/v; drinking but hasn't eaten much  Less uop than normal      Intake/Output Summary (Last 24 hours) at 09/04/2020 0955 Last data filed at 09/04/2020 0800 Gross per 24 hour  Intake 1121.08 ml  Output 350 ml  Net 771.08 ml    Vitals:  Vitals:   09/04/20 0600 09/04/20 0800 09/04/20 0817 09/04/20 0900  BP:  131/60  (!) 163/55  Pulse: 66 66  69  Resp: (!) 21 (!) 31    Temp:   98 F (36.7 C)   TempSrc:   Oral   SpO2: 93% 95%  96%  Weight:      Height:         Physical Exam:  General adult female in bed in no acute distress HEENT normocephalic atraumatic extraocular movements intact sclera anicteric Neck supple trachea midline Lungs clear to auscultation bilaterally normal work of breathing at rest  Heart S1S2 no rub Abdomen soft nontender nondistended Extremities no edema appreciated Psych normal mood and affect Neuro wakes to voice and answers questions; sleepy  Medications reviewed   Labs:  BMP Latest Ref Rng & Units 09/04/2020 09/03/2020 09/02/2020  Glucose 70 - 99 mg/dL 139(H) 199(H) 187(H)  BUN 8 - 23 mg/dL 64(H) 56(H) 46(H)  Creatinine 0.44 - 1.00 mg/dL 2.96(H) 3.03(H) 2.24(H)  BUN/Creat Ratio 12 - 28 - - -  Sodium 135 - 145 mmol/L 137 139 135  Potassium 3.5 - 5.1 mmol/L 5.3(H) 5.4(H) 4.9  Chloride 98 - 111 mmol/L 105 106 108  CO2 22 - 32 mmol/L 20(L) 29 24  Calcium 8.9 - 10.3 mg/dL 8.4(L) 7.8(L) 7.8(L)     Assessment/Plan:   Molly [name] is a/an 70 y.o. female with a past medical  history DM2, HTN, CKD 3b (BL 1.1-1.4), PAD who presented w/ NSTEMI, htn, and AKI  # Oliguric AKI on CKD 3b:  - Likely secondary to ischemic ATN/overcorrection of blood pressure with intermittent hypotension.  Renal artery PVL shows relatively small left kidney (9cm compared to 11.2 on right) but no left RAS.  BL Cr around 1.1-1.4.  - Obtain UA and ordered up/cr ratio - Continue supportive care and appears to have plateaued - agree with gentle IV hydration - NS is at 50 ml/hr  - Currently no indication for HD;  - ordered strict ins/outs - check renal ultrasound  # Hypertension:  - Admitted with hypertensive emergency with hypotension intermittently.  Note coreg decrease to 12.5 mg twice daily  - Goal for systolic blood pressure around 130-150 unless higher blood pressure exacerbates cardiac symptoms.  BP is at 132/52 on my exam - stop sodium bicarbonate for now  # Anemia normocytic:  - Hemoglobin 10.3  - check iron stores    # Hyperkalemia:  - lokelma again today - agree - changed to renal diabetic diet   # DM2: Management per primary team    Claudia Desanctis, MD 09/04/2020 10:18 AM

## 2020-09-04 NOTE — Progress Notes (Signed)
Pt appears intermittently confused and has removed nasal cannula several times and 2 IVs. New 20G IV placed in L forearm. Pt answers orientation questions appropriately and is able to follow commands; however, pt is much more lethargic now. [facility] notified and new orders have been placed. Will continue to monitor patient closely.

## 2020-09-04 NOTE — Progress Notes (Signed)
Cardiology Progress Note  Patient ID: Molly [name] MRN: [id number] DOB: 06-07-1950 Date of Encounter: 09/04/2020  Primary Cardiologist: Jenne Campus, MD  Subjective   Chief Complaint: None.  HPI: Kidney function improved today.  Nephrology following.  They would like permissive hypertension.  This is okay.  Denies chest pain.  ROS:  All other ROS reviewed and negative. Pertinent positives noted in the HPI.     Inpatient Medications  Scheduled Meds: . aspirin  81 mg Oral Daily  . carvedilol  12.5 mg Oral BID WC  . cetirizine  5 mg Oral QHS  . Chlorhexidine Gluconate Cloth  6 each Topical Daily  . cholecalciferol  2,000 Units Oral Daily  . cilostazol  50 mg Oral BID  . ezetimibe  10 mg Oral Daily  . famotidine  20 mg Oral Daily  . gabapentin  100 mg Oral TID  . insulin aspart  0-5 Units Subcutaneous QHS  . insulin aspart  0-9 Units Subcutaneous TID WC  . lipase/protease/amylase  72,000 Units Oral TID with meals  . mouth rinse  15 mL Mouth Rinse BID  . rosuvastatin  40 mg Oral Daily  . sodium bicarbonate  1,300 mg Oral TID  . sodium chloride flush  3 mL Intravenous Q12H  . sodium zirconium cyclosilicate  10 g Oral Daily   Continuous Infusions: . sodium chloride    . sodium chloride 50 mL/hr at 09/04/20 0623   PRN Meds: sodium chloride, acetaminophen, ALPRAZolam, diphenhydrAMINE, hydrALAZINE, loperamide, nitroGLYCERIN, ondansetron (ZOFRAN) IV, sodium chloride flush   Vital Signs   Vitals:   09/04/20 0300 09/04/20 0400 09/04/20 0436 09/04/20 0600  BP: (!) 148/61 (!) 156/82    Pulse: 68 68  66  Resp: 20 (!) 22  (!) 21  Temp:   98.5 F (36.9 C)   TempSrc:   Oral   SpO2: 97% 93%  93%  Weight:      Height:        Intake/Output Summary (Last 24 hours) at 09/04/2020 0710 Last data filed at 09/04/2020 [id number] Gross per 24 hour  Intake 1339.14 ml  Output 0 ml  Net 1339.14 ml   Last 3 Weights 08/31/2020 06/03/2020 05/18/2020  Weight (lbs) 159 lb 157 lb 9.6 oz 155 lb   Weight (kg) 72.122 kg 71.487 kg 70.308 kg      Telemetry  Overnight telemetry shows sinus rhythm in the 70s, which I personally reviewed.   Physical Exam   Vitals:   09/04/20 0300 09/04/20 0400 09/04/20 0436 09/04/20 0600  BP: (!) 148/61 (!) 156/82    Pulse: 68 68  66  Resp: 20 (!) 22  (!) 21  Temp:   98.5 F (36.9 C)   TempSrc:   Oral   SpO2: 97% 93%  93%  Weight:      Height:         Intake/Output Summary (Last 24 hours) at 09/04/2020 0710 Last data filed at 09/04/2020 [id number] Gross per 24 hour  Intake 1339.14 ml  Output 0 ml  Net 1339.14 ml    Last 3 Weights 08/31/2020 06/03/2020 05/18/2020  Weight (lbs) 159 lb 157 lb 9.6 oz 155 lb  Weight (kg) 72.122 kg 71.487 kg 70.308 kg    Body mass index is 29.08 kg/m.  General: Well nourished, well developed, in no acute distress Head: Atraumatic, normal size  Eyes: PEERLA, EOMI  Neck: Supple, no JVD Endocrine: No thryomegaly Cardiac: Normal S1, S2; RRR; no murmurs, rubs, or gallops Lungs: Diminished breath sounds  at the lung bases Abd: Soft, nontender, no hepatomegaly  Ext: No edema, pulses 2+ Musculoskeletal: No deformities, BUE and BLE strength normal and equal Skin: Warm and dry, no rashes   Neuro: Alert and oriented to person, place, time, and situation, CNII-XII grossly intact, no focal deficits  Psych: Normal mood and affect   Labs  High Sensitivity Troponin:   Recent Labs  Lab 08/31/20 0720 08/31/20 0934  TROPONINIHS 91* 95*     Cardiac EnzymesNo results for input(s): TROPONINI in the last 168 hours. No results for input(s): TROPIPOC in the last 168 hours.  Chemistry Recent Labs  Lab 09/02/20 0257 09/03/20 0302 09/04/20 0219  NA 135 139 137  K 4.9 5.4* 5.3*  CL 108 106 105  CO2 24 29 20*  GLUCOSE 187* 199* 139*  BUN 46* 56* 64*  CREATININE 2.24* 3.03* 2.96*  CALCIUM 7.8* 7.8* 8.4*  PROT  --  6.6  --   ALBUMIN  --  3.2*  --   AST  --  22  --   ALT  --  19  --   ALKPHOS  --  58  --   BILITOT  --  0.3   --   GFRNONAA 23* 16* 17*  ANIONGAP 3* 4* 12    Hematology Recent Labs  Lab 09/01/20 0042 09/02/20 0257 09/04/20 0219  WBC 8.5 5.8 5.6  RBC 3.93 3.49* 3.84*  HGB 10.7* 9.5* 10.3*  HCT 35.1* 31.9* 34.7*  MCV 89.3 91.4 90.4  MCH 27.2 27.2 26.8  MCHC 30.5 29.8* 29.7*  RDW 14.2 14.6 14.5  PLT 234 203 256   BNP Recent Labs  Lab 08/31/20 0720  BNP 421.0*    DDimer  Recent Labs  Lab 08/31/20 0720  DDIMER 0.83*     Radiology  VAS US RENAL ARTERY DUPLEX  Result Date: 09/03/2020 ABDOMINAL VISCERAL Patient Name:  Molly [name]  Date of Exam:   09/03/2020 Medical Rec #: [id number]     Accession #:    [id number] Date of Birth: 1951-04-04     Patient Gender: F Patient Age:   70Y Exam Location:  [facility] Procedure:      VAS US RENAL ARTERY DUPLEX Referring Phys: [id number] Cassie Freer O'NEAL -------------------------------------------------------------------------------- Indications: 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 Limitations: Air/bowel gas and obesity. Comparison Study: 08/31/2020 renal artery duplex- limited diagnostic quality Performing Technologist: Maudry Mayhew MHA, RDMS, RVT, RDCS  Examination Guidelines: A complete evaluation includes B-mode imaging, spectral Doppler, color Doppler, and power Doppler as needed of all accessible portions of each vessel. Bilateral testing is considered an integral part of a complete examination. Limited examinations for reoccurring indications may be performed as noted.  Duplex Findings: +----------+--------+--------+------+--------+ MesentericPSV cm/sEDV cm/sPlaqueComments +----------+--------+--------+------+--------+ Aorta Prox   45      13                  +----------+--------+--------+------+--------+    +------------------+--------+--------+------------------+ Right Renal ArteryPSV cm/sEDV cm/s     Comment        +------------------+--------+--------+------------------+ Origin                            Unable to insonate +------------------+--------+--------+------------------+ Proximal            110      19                      +------------------+--------+--------+------------------+  Mid                 140      21                      +------------------+--------+--------+------------------+ Distal               66      8                       +------------------+--------+--------+------------------+ +-----------------+--------+--------+------------------+ Left Renal ArteryPSV cm/sEDV cm/s     Comment       +-----------------+--------+--------+------------------+ Origin              46      11                      +-----------------+--------+--------+------------------+ Proximal                         Unable to insonate +-----------------+--------+--------+------------------+ Mid                 39      9                       +-----------------+--------+--------+------------------+ Distal              45      12                      +-----------------+--------+--------+------------------+ +------------+--------+--------+----+-----------+--------+--------+----+ Right KidneyPSV cm/sEDV cm/sRI  Left KidneyPSV cm/sEDV cm/sRI   +------------+--------+--------+----+-----------+--------+--------+----+ Upper Pole  11      5       0.54Upper Pole 16      5       0.65 +------------+--------+--------+----+-----------+--------+--------+----+ Mid         10      4       0.58Mid        13      4       0.65 +------------+--------+--------+----+-----------+--------+--------+----+ Lower Pole  8       3       0.63Lower Pole                      +------------+--------+--------+----+-----------+--------+--------+----+ Hilar       21      3       0.87Hilar      23      5       0.79  +------------+--------+--------+----+-----------+--------+--------+----+ +------------------+---------+------------------+------------------+ Right Kidney               Left Kidney                          +------------------+---------+------------------+------------------+ RAR                        RAR                                  +------------------+---------+------------------+------------------+ RAR (manual)      3.11     RAR (manual)      1.02               +------------------+---------+------------------+------------------+ Cortex            12/3 cm/sCortex  Unable to insonate +------------------+---------+------------------+------------------+ Cortex thickness           Corex thickness                      +------------------+---------+------------------+------------------+ Kidney length (cm)11.20    Kidney length (cm)8.91               +------------------+---------+------------------+------------------+  Summary: Renal:  Right: Upper range 1-59% stenosis of the right renal artery by        velocities of visualized segments. RRV flow present. Left:  No evidence of hemodynamically significant left renal artery        stenosis. LRV flow present. Abnormal size for the left        kidney.  *See table(s) above for measurements and observations.  Diagnosing physician: Deitra Mayo MD  Electronically signed by Deitra Mayo MD on 09/03/2020 at 8:20:36 PM.    Final     Cardiac Studies  TTE 08/31/2020 1. Left ventricular ejection fraction, by estimation, is 50 to 55%. Left  ventricular ejection fraction by 3D volume is 51 %. The left ventricle has  low normal function. The left ventricle has no regional wall motion  abnormalities. There is moderate  concentric left ventricular hypertrophy. Left ventricular diastolic  parameters are consistent with Grade I diastolic dysfunction (impaired  relaxation).  2. Right ventricular systolic function is  normal. The right ventricular  size is normal.  3. The pericardial effusion is circumferential. There is no evidence of  cardiac tamponade.  4. The mitral valve is grossly normal. Trivial mitral valve  regurgitation. No evidence of mitral stenosis.  5. The aortic valve is tricuspid. There is mild calcification of the  aortic valve. There is mild thickening of the aortic valve. Aortic valve  regurgitation is not visualized. No aortic stenosis is present.  6. The inferior vena cava is normal in size with greater than 50%  respiratory variability, suggesting right atrial pressure of 3 mmHg.   Patient Profile  70 year old female history of diabetes, hypertension, severe PAD, CKD stage III, hyperlipidemia who was admitted on 08/31/2020 with accelerated hypertension and elevated troponin. Course has been complicated by AKI that is delayed for left heart catheterization.  Assessment & Plan   1.  Accelerated hypertension/elevated troponin concerning for non-STEMI/acute hypoxic respiratory failure/elevated BNP -Admitted with severely elevated blood pressures BPs in the 200 range.  BNP elevated.  Chest x-ray and chest CT with possible pneumonia.  No white count.  Procalcitonin negative.  No fevers.  Does not appear to be pneumonia. -She did report chest pain with her elevated blood pressure.  She has extensive coronary calcifications on recent chest CT. -Troponin elevation was minimal and flat.  Likely demand.  I do suspect she does have underlying CAD.  Given her chest pain that she had with elevated troponin I would recommend left heart catheterization.  This has been delayed due to AKI. -She has completed 48 hours of heparin.  She will remain on aspirin and statin therapy. -She is on a beta-blocker. -No further chest pain and EKG is nonischemic. -She really has no cardiac reason for hypoxic respiratory failure.  Right heart pressures are normal on echo.  I think we will get a better idea of  LVEDP at the time of left heart cath.  Her creatinine is coming down.  We will tentatively plan for left heart catheterization on Monday. -Nephrology is following and helping with blood pressure management. -It is okay to have permissive hypertension. -Her  echocardiogram is reassuring which shows low normal LV function.  There are no regional wall motion abnormalities.  2.  Acute hypoxic respiratory failure -Unclear etiology.  Could have been a flash pulmonary edema picture that resolved quickly in the setting of hypertensive crisis. -Procalcitonin is negative.  Chest CT with possible pneumonia.  She has no fever or elevated white count.  This could all just be pulmonary edema. -She is not volume overloaded.  Her BNP was elevated.  BNP could be elevated in setting of malignant hypertension. -We will measure LVEDP at the time of left heart cath.  Awaiting on kidney function to recover.  3.  AKI -Rule due to rapid fluctuation in blood pressure. -Nephrology is following.  Kidney number is improving. -She has had little urine output. -Nephrology is following.  4.  Severe PAD/left renal artery stenosis -90% left renal artery stenosis at cath recently.  Repeat renal artery ultrasound with no significant stenoses.  This does not appear to that her hypertension is driven by this.  5.  Hyperlipidemia -1 statin.  Zetia added.  Likely will need to be considered for PCSK9 inhibitor as an outpatient.  For questions or updates, please contact [place] Please consult [email or web address] for contact info under   Time Spent with Patient: I have spent a total of 25 minutes with patient reviewing hospital notes, telemetry, EKGs, labs and examining the patient as well as establishing an assessment and plan that was discussed with the patient.  > 50% of time was spent in direct patient care.    Signed, Addison Naegeli. Audie Box, MD, [place]  09/04/2020 7:10 AM

## 2020-09-04 NOTE — Progress Notes (Signed)
Pt up to the bedside commode and transfer to chair, 2 person assist. Pt ate about 25% of lunch

## 2020-09-05 ENCOUNTER — Inpatient Hospital Stay (HOSPITAL_COMMUNITY): Payer: Medicare Other

## 2020-09-05 DIAGNOSIS — R079 Chest pain, unspecified: Secondary | ICD-10-CM | POA: Diagnosis not present

## 2020-09-05 DIAGNOSIS — I214 Non-ST elevation (NSTEMI) myocardial infarction: Secondary | ICD-10-CM | POA: Diagnosis not present

## 2020-09-05 LAB — GLUCOSE, CAPILLARY
Glucose-Capillary: 125 mg/dL — ABNORMAL HIGH (ref 70–99)
Glucose-Capillary: 110 mg/dL — ABNORMAL HIGH (ref 70–99)
Glucose-Capillary: 119 mg/dL — ABNORMAL HIGH (ref 70–99)
Glucose-Capillary: 108 mg/dL — ABNORMAL HIGH (ref 70–99)

## 2020-09-05 LAB — BLOOD GAS, ARTERIAL
Acid-base deficit: 7 mmol/L — ABNORMAL HIGH (ref 0.0–2.0)
Acid-base deficit: 1.6 mmol/L (ref 0.0–2.0)
Bicarbonate: 20.8 mmol/L (ref 20.0–28.0)
Bicarbonate: 23.8 mmol/L (ref 20.0–28.0)
Delivery systems: POSITIVE
Drawn by: 270211
FIO2: 36
FIO2: 40
O2 Content: 4 L/min
O2 Saturation: 91.8 %
O2 Saturation: 92.7 %
Patient temperature: 98.6
Patient temperature: 98.6
pCO2 arterial: 56.2 mmHg — ABNORMAL HIGH (ref 32.0–48.0)
pCO2 arterial: 46.5 mmHg (ref 32.0–48.0)
pH, Arterial: 7.194 — CL (ref 7.350–7.450)
pH, Arterial: 7.33 — ABNORMAL LOW (ref 7.350–7.450)
pO2, Arterial: 67.5 mmHg — ABNORMAL LOW (ref 83.0–108.0)
pO2, Arterial: 62.4 mmHg — ABNORMAL LOW (ref 83.0–108.0)

## 2020-09-05 LAB — COMPREHENSIVE METABOLIC PANEL
ALT: 22 U/L (ref 0–44)
AST: 29 U/L (ref 15–41)
Albumin: 3.6 g/dL (ref 3.5–5.0)
Alkaline Phosphatase: 69 U/L (ref 38–126)
Anion gap: 9 (ref 5–15)
BUN: 72 mg/dL — ABNORMAL HIGH (ref 8–23)
CO2: 22 mmol/L (ref 22–32)
Calcium: 8.4 mg/dL — ABNORMAL LOW (ref 8.9–10.3)
Chloride: 107 mmol/L (ref 98–111)
Creatinine, Ser: 3.13 mg/dL — ABNORMAL HIGH (ref 0.44–1.00)
GFR, Estimated: 16 mL/min — ABNORMAL LOW (ref >60)
Glucose, Bld: 136 mg/dL — ABNORMAL HIGH (ref 70–99)
Potassium: 5 mmol/L (ref 3.5–5.1)
Sodium: 138 mmol/L (ref 135–145)
Total Bilirubin: 0.6 mg/dL (ref 0.3–1.2)
Total Protein: 7.1 g/dL (ref 6.5–8.1)

## 2020-09-05 LAB — CBC
HCT: 35.6 % — ABNORMAL LOW (ref 36.0–46.0)
Hemoglobin: 10.5 g/dL — ABNORMAL LOW (ref 12.0–15.0)
MCH: 26.9 pg (ref 26.0–34.0)
MCHC: 29.5 g/dL — ABNORMAL LOW (ref 30.0–36.0)
MCV: 91.3 fL (ref 80.0–100.0)
Platelets: 290 10*3/uL (ref 150–400)
RBC: 3.9 MIL/uL (ref 3.87–5.11)
RDW: 14.6 % (ref 11.5–15.5)
WBC: 5.7 10*3/uL (ref 4.0–10.5)
nRBC: 0 % (ref 0.0–0.2)

## 2020-09-05 LAB — BRAIN NATRIURETIC PEPTIDE: B Natriuretic Peptide: 1047.3 pg/mL — ABNORMAL HIGH (ref 0.0–100.0)

## 2020-09-05 LAB — AMMONIA: Ammonia: 14 umol/L (ref 9–35)

## 2020-09-05 LAB — LACTIC ACID, PLASMA
Lactic Acid, Venous: 0.7 mmol/L (ref 0.5–1.9)
Lactic Acid, Venous: 0.9 mmol/L (ref 0.5–1.9)

## 2020-09-05 LAB — PROCALCITONIN: Procalcitonin: 0.11 ng/mL

## 2020-09-05 MED ORDER — IPRATROPIUM-ALBUTEROL 0.5-2.5 (3) MG/3ML IN SOLN: 3.0000 mL | RESPIRATORY_TRACT | Status: DC | PRN

## 2020-09-05 MED ORDER — FUROSEMIDE 10 MG/ML IJ SOLN
80.0000 mg | Freq: Two times a day (BID) | INTRAMUSCULAR | Status: DC
  Administered 2020-09-05 – 2020-09-09 (×9): 80 mg via INTRAVENOUS
  Filled 2020-09-05 (×9): qty 8

## 2020-09-05 MED ORDER — STERILE WATER FOR INJECTION IV SOLN: INTRAVENOUS | Status: DC

## 2020-09-05 MED ORDER — STERILE WATER FOR INJECTION IV SOLN
INTRAVENOUS | Status: DC
  Filled 2020-09-05: qty 1000

## 2020-09-05 MED ORDER — LIP MEDEX EX OINT
TOPICAL_OINTMENT | CUTANEOUS | Status: AC
  Filled 2020-09-05: qty 7

## 2020-09-05 MED ORDER — SODIUM BICARBONATE 8.4 % IV SOLN
100.0000 meq | Freq: Once | INTRAVENOUS | Status: AC
  Administered 2020-09-05: 100 meq via INTRAVENOUS
  Filled 2020-09-05: qty 50

## 2020-09-05 MED ORDER — FUROSEMIDE 10 MG/ML IJ SOLN
20.0000 mg | Freq: Once | INTRAMUSCULAR | Status: AC
  Administered 2020-09-05: 20 mg via INTRAVENOUS
  Filled 2020-09-05: qty 2

## 2020-09-05 MED ORDER — SODIUM BICARBONATE 8.4 % IV SOLN
INTRAVENOUS | Status: AC
  Filled 2020-09-05: qty 50

## 2020-09-05 NOTE — Progress Notes (Signed)
Cardiology Progress Note  Patient ID: Molly [name] MRN: [id number] DOB: [birth year] Date of Encounter: 09/05/2020  Primary Cardiologist: Jenne Campus, MD  Subjective   Chief Complaint: Shortness of breath  HPI: Remains somnolent.  ABG so she is acidotic and hypoxic.  7.2/56/67.  Chest x-ray with pulmonary edema versus pneumonia.  She really was without complaints when I saw her.  Creatinine continues to rise.  ROS:  All other ROS reviewed and negative. Pertinent positives noted in the HPI.     Inpatient Medications  Scheduled Meds: . aspirin  81 mg Oral Daily  . carvedilol  12.5 mg Oral BID WC  . cetirizine  5 mg Oral QHS  . Chlorhexidine Gluconate Cloth  6 each Topical Daily  . cholecalciferol  2,000 Units Oral Daily  . cilostazol  50 mg Oral BID  . ezetimibe  10 mg Oral Daily  . famotidine  20 mg Oral Daily  . furosemide  20 mg Intravenous Once  . gabapentin  100 mg Oral TID  . heparin injection (subcutaneous)  5,000 Units Subcutaneous Q8H  . insulin aspart  0-5 Units Subcutaneous QHS  . insulin aspart  0-9 Units Subcutaneous TID WC  . lipase/protease/amylase  72,000 Units Oral TID with meals  . mouth rinse  15 mL Mouth Rinse BID  . rosuvastatin  40 mg Oral Daily  . sodium bicarbonate  100 mEq Intravenous Once  . sodium chloride flush  3 mL Intravenous Q12H  . sodium zirconium cyclosilicate  10 g Oral Daily   Continuous Infusions: . sodium chloride    .  sodium bicarbonate (isotonic) infusion in sterile water     PRN Meds: sodium chloride, acetaminophen, ALPRAZolam, diphenhydrAMINE, hydrALAZINE, ipratropium-albuterol, loperamide, nitroGLYCERIN, ondansetron (ZOFRAN) IV, sodium chloride flush   Vital Signs   Vitals:   09/05/20 0400 09/05/20 0500 09/05/20 0718 09/05/20 0733  BP: (!) 145/65 (!) 144/49    Pulse: 68 67  70  Resp: 20 (!) 28  19  Temp: 98 F (36.7 C)  97.6 F (36.4 C)   TempSrc: Oral  Axillary   SpO2: (!) 88% 91%  98%  Weight:      Height:         Intake/Output Summary (Last 24 hours) at 09/05/2020 0804 Last data filed at 09/05/2020 0529 Gross per 24 hour  Intake 1383.67 ml  Output 415 ml  Net 968.67 ml   Last 3 Weights 08/31/2020 06/03/2020 05/18/2020  Weight (lbs) 159 lb 157 lb 9.6 oz 155 lb  Weight (kg) 72.122 kg 71.487 kg 70.308 kg      Telemetry  Overnight telemetry shows sinus rhythm in the 60s, which I personally reviewed.    Physical Exam   Vitals:   09/05/20 0400 09/05/20 0500 09/05/20 0718 09/05/20 0733  BP: (!) 145/65 (!) 144/49    Pulse: 68 67  70  Resp: 20 (!) 28  19  Temp: 98 F (36.7 C)  97.6 F (36.4 C)   TempSrc: Oral  Axillary   SpO2: (!) 88% 91%  98%  Weight:      Height:         Intake/Output Summary (Last 24 hours) at 09/05/2020 0804 Last data filed at 09/05/2020 0529 Gross per 24 hour  Intake 1383.67 ml  Output 415 ml  Net 968.67 ml    Last 3 Weights 08/31/2020 06/03/2020 05/18/2020  Weight (lbs) 159 lb 157 lb 9.6 oz 155 lb  Weight (kg) 72.122 kg 71.487 kg 70.308 kg    Body  mass index is 29.08 kg/m.  General: Well nourished, well developed, in no acute distress Head: Atraumatic, normal size  Eyes: PEERLA, EOMI  Neck: Supple, no JVD Endocrine: No thryomegaly Cardiac: Normal S1, S2; RRR; no murmurs, rubs, or gallops Lungs: Diminished breath sounds bilaterally Abd: Soft, nontender, no hepatomegaly  Ext: No edema, pulses 2+ Musculoskeletal: No deformities, BUE and BLE strength normal and equal Skin: Warm and dry, no rashes   Neuro: Alert and oriented to person, place, time, and situation, CNII-XII grossly intact, no focal deficits  Psych: Normal mood and affect   Labs  High Sensitivity Troponin:   Recent Labs  Lab 08/31/20 0720 08/31/20 0934  TROPONINIHS 91* 95*     Cardiac EnzymesNo results for input(s): TROPONINI in the last 168 hours. No results for input(s): TROPIPOC in the last 168 hours.  Chemistry Recent Labs  Lab 09/03/20 0302 09/04/20 0219 09/05/20 0012  NA 139 137  138  K 5.4* 5.3* 5.0  CL 106 105 107  CO2 29 20* 22  GLUCOSE 199* 139* 136*  BUN 56* 64* 72*  CREATININE 3.03* 2.96* 3.13*  CALCIUM 7.8* 8.4* 8.4*  PROT 6.6  --  7.1  ALBUMIN 3.2*  --  3.6  AST 22  --  29  ALT 19  --  22  ALKPHOS 58  --  69  BILITOT 0.3  --  0.6  GFRNONAA 16* 17* 16*  ANIONGAP 4* 12 9    Hematology Recent Labs  Lab 09/02/20 0257 09/04/20 0219 09/05/20 0012  WBC 5.8 5.6 5.7  RBC 3.49* 3.84* 3.90  HGB 9.5* 10.3* 10.5*  HCT 31.9* 34.7* 35.6*  MCV 91.4 90.4 91.3  MCH 27.2 26.8 26.9  MCHC 29.8* 29.7* 29.5*  RDW 14.6 14.5 14.6  PLT 203 256 290   BNP Recent Labs  Lab 08/31/20 0720  BNP 421.0*    DDimer  Recent Labs  Lab 08/31/20 0720  DDIMER 0.83*     Radiology  US RENAL  Result Date: 09/04/2020 CLINICAL DATA:  Acute renal insufficiency EXAM: RENAL / URINARY TRACT ULTRASOUND COMPLETE COMPARISON:  None. FINDINGS: Right Kidney: Renal measurements: 9.8 x 4.8 x 4.7 cm = volume: 115 mL. Echogenicity within normal limits. No mass or hydronephrosis visualized. Left Kidney: Renal measurements: 9.8 x 4.5 x 4.7 cm = volume: 108 mL. Echogenicity within normal limits. No mass or hydronephrosis visualized. Bladder: Appears normal for degree of bladder distention. Other: None. IMPRESSION: No abnormalities identified. No cause for the patient's symptoms identified. Electronically Signed   By: Dorise Bullion III M.D   On: 09/04/2020 13:39   VAS US RENAL ARTERY DUPLEX  Result Date: 09/03/2020 ABDOMINAL VISCERAL Patient Name:  Molly [name]  Date of Exam:   09/03/2020 Medical Rec #: [id number]     Accession #:    [id number] Date of Birth: 1951-04-09     Patient Gender: F Patient Age:   70Y Exam Location:  [facility] Procedure:      VAS US RENAL ARTERY DUPLEX Referring Phys: [id number] [place] -------------------------------------------------------------------------------- Indications: Right renal artery stenosis seen on heart catheterization High Risk  Factors: Hypertension, hyperlipidemia. Limitations: Air/bowel gas and obesity. Comparison Study: 08/31/2020 renal artery duplex- limited diagnostic quality Performing Technologist: Maudry Mayhew MHA, RDMS, RVT, RDCS  Examination Guidelines: A complete evaluation includes B-mode imaging, spectral Doppler, color Doppler, and power Doppler as needed of all accessible portions of each vessel. Bilateral testing is considered an integral part of a complete examination. Limited examinations for  reoccurring indications may be performed as noted.  Duplex Findings: +----------+--------+--------+------+--------+ MesentericPSV cm/sEDV cm/sPlaqueComments +----------+--------+--------+------+--------+ Aorta Prox   45      13                  +----------+--------+--------+------+--------+    +------------------+--------+--------+------------------+ Right Renal ArteryPSV cm/sEDV cm/s     Comment       +------------------+--------+--------+------------------+ Origin                            Unable to insonate +------------------+--------+--------+------------------+ Proximal            110      19                      +------------------+--------+--------+------------------+ Mid                 140      21                      +------------------+--------+--------+------------------+ Distal               66      8                       +------------------+--------+--------+------------------+ +-----------------+--------+--------+------------------+ Left Renal ArteryPSV cm/sEDV cm/s     Comment       +-----------------+--------+--------+------------------+ Origin              46      11                      +-----------------+--------+--------+------------------+ Proximal                         Unable to insonate +-----------------+--------+--------+------------------+ Mid                 39      9                        +-----------------+--------+--------+------------------+ Distal              45      12                      +-----------------+--------+--------+------------------+ +------------+--------+--------+----+-----------+--------+--------+----+ Right KidneyPSV cm/sEDV cm/sRI  Left KidneyPSV cm/sEDV cm/sRI   +------------+--------+--------+----+-----------+--------+--------+----+ Upper Pole  11      5       0.54Upper Pole 16      5       0.65 +------------+--------+--------+----+-----------+--------+--------+----+ Mid         10      4       0.58Mid        13      4       0.65 +------------+--------+--------+----+-----------+--------+--------+----+ Lower Pole  8       3       0.63Lower Pole                      +------------+--------+--------+----+-----------+--------+--------+----+ Hilar       21      3       0.87Hilar      23      5       0.79 +------------+--------+--------+----+-----------+--------+--------+----+ +------------------+---------+------------------+------------------+ Right Kidney  Left Kidney                          +------------------+---------+------------------+------------------+ RAR                        RAR                                  +------------------+---------+------------------+------------------+ RAR (manual)      3.11     RAR (manual)      1.02               +------------------+---------+------------------+------------------+ Cortex            12/3 cm/sCortex            Unable to insonate +------------------+---------+------------------+------------------+ Cortex thickness           Corex thickness                      +------------------+---------+------------------+------------------+ Kidney length (cm)11.20    Kidney length (cm)8.91               +------------------+---------+------------------+------------------+  Summary: Renal:  Right: Upper range 1-59% stenosis of the right renal artery  by        velocities of visualized segments. RRV flow present. Left:  No evidence of hemodynamically significant left renal artery        stenosis. LRV flow present. Abnormal size for the left        kidney.  *See table(s) above for measurements and observations.  Diagnosing physician: Deitra Mayo MD  Electronically signed by Deitra Mayo MD on 09/03/2020 at 8:20:36 PM.    Final     Cardiac Studies  TTE 08/31/2020 70 year old female history of diabetes, hypertension, severe PAD, CKD stage III, hyperlipidemia who was admitted on 08/31/2020 with accelerated hypertension and elevated troponin.Course has been complicated by AKI that is delayed for left heart catheterization.  Patient Profile  70 year old female history of diabetes, hypertension, severe PAD, CKD stage III, hyperlipidemia who was admitted on 08/31/2020 with accelerated hypertension and elevated troponin.Course has been complicated by AKI that is delayed for left heart catheterization.  Course complicated on 0000000 for mixed hypercarbic/hypoxic respiratory failure.  She also is acidotic.  Assessment & Plan   1.  Accelerated hypertension/elevated troponin concerning for non-STEMI/acute hypoxic respiratory failure -Initially admitted on 08/31/2020 with severely elevated blood pressures. -Troponin was minimally elevated and flat. -EKG was nonischemic. -Unclear what led to her hypoxia respiratory failure.  Possibly this was hypertensive crisis.  Her BNP was minimally elevated.  Chest x-ray and chest CT initially showed no evidence of pulmonary edema and possible pneumonia.  Initial infectious work-up was negative. -Chest x-ray now with pulm edema today.  Kidney function continues to rise.  We will attempt diuresis.  I have ordered lactic acids.  Her acidosis appears possibly a bit out of proportion to her hypercarbia. -I have ordered lactic acid as well.  Likely needs a repeat infectious work-up. -Discussed her case with  hospital medicine.  We will attempt diuresis.  Likely will need renal input given her acute kidney injury. -At this point left heart catheterization will be delayed likely indefinitely.  I think her kidney function takes precedence.  We also need to optimize her volume status.  She surprisingly does not look that volume overloaded. -She has completed 48 hours of heparin.  She  will continue aspirin and statin in case this was a secondary demand ischemia process.  She is on a beta-blocker. -We had initially delayed any aggressive diuresis to pursue left heart catheterization and to assess LVEDP.  She is just never appeared that volume overloaded.  Apparently we will not be able to make that happen.  We will continue with diuresis.  2.  Acute hypercarbic/hypoxic respiratory failure -She is acidotic with a pH of 7.2.  Her CO2 is 56.  I have ordered a lactic acid.  Could just be a mixed picture. -She will undergo diuresis.  We will also need renal input. -Suspect this is multifactorial but may need a repeat infectious work-up.  Chest CT initially concerning for pneumonia but she is never had an increased white count and Pro-Cal's were negative.  3.  AKI -Kidney function continues to decline.  Left heart catheterization is on hold.  Appreciate Renal input.  4.  Severe PAD/left renal artery stenosis -90% left renal artery stenosis at cath.  Repeat renal ultrasounds of the arteries showed no stenoses.  For questions or updates, please contact [place] Please consult [email or web address] for contact info under   Time Spent with Patient: I have spent a total of 35 minutes with patient reviewing hospital notes, telemetry, EKGs, labs and examining the patient as well as establishing an assessment and plan that was discussed with the patient.  > 50% of time was spent in direct patient care.    Signed, Addison Naegeli. Audie Box, MD, [place]  09/05/2020 8:04 AM

## 2020-09-05 NOTE — Progress Notes (Signed)
Pt continues to remove nasal cannula from nose. Importance of oxygen has been explained several times throughout the night. Pt has not slept at all tonight. Mittens applied to hands to prevent patient from removing necessary equipment.

## 2020-09-05 NOTE — Progress Notes (Signed)
[facility] Progress Note  Name: Molly [name] MRN: [id number] DOB: [birth year]   Subjective:  Confused overnight, ABG with pH 7.19, PaCO2 56, placed on BiPAP  1 view chest x-ray suggest edema, prescribed furosemide 20 mg IV by PCP Received sodium bicarbonate as well, serum bicarbonate is 22, placed on bicarbonate infusion  Creatinine is 3.1, was 3.0 yesterday, 0.8 L urine output recorded  Blood pressures have been stable  A.m. potassium 5.0 Renal US 5/21 no acute issues, nl sized b/l    Intake/Output Summary (Last 24 hours) at 09/05/2020 [id number] Last data filed at 09/05/2020 0857 Gross per 24 hour  Intake 1549.22 ml  Output 415 ml  Net 1134.22 ml    Vitals:  Vitals:   09/05/20 0400 09/05/20 0500 09/05/20 0718 09/05/20 0733  BP: (!) 145/65 (!) 144/49    Pulse: 68 67  70  Resp: 20 (!) 28  19  Temp: 98 F (36.7 C)  97.6 F (36.4 C)   TempSrc: Oral  Axillary   SpO2: (!) 88% 91%  98%  Weight:      Height:         Physical Exam:  General adult female in bed in no acute distress HEENT normocephalic atraumatic extraocular movements intact sclera anicteric Neck supple trachea midline Lungs clear to auscultation bilaterally normal work of breathing at rest  Heart S1S2 no rub Abdomen soft nontender nondistended Extremities no edema appreciated Psych normal mood and affect Neuro wakes to voice and answers questions; sleepy  Medications reviewed   Labs:  BMP Latest Ref Rng & Units 09/05/2020 09/04/2020 09/03/2020  Glucose 70 - 99 mg/dL 136(H) 139(H) 199(H)  BUN 8 - 23 mg/dL 72(H) 64(H) 56(H)  Creatinine 0.44 - 1.00 mg/dL 3.13(H) 2.96(H) 3.03(H)  BUN/Creat Ratio 12 - 28 - - -  Sodium 135 - 145 mmol/L 138 137 139  Potassium 3.5 - 5.1 mmol/L 5.0 5.3(H) 5.4(H)  Chloride 98 - 111 mmol/L 107 105 106  CO2 22 - 32 mmol/L 22 20(L) 29  Calcium 8.9 - 10.3 mg/dL 8.4(L) 8.4(L) 7.8(L)     Assessment/Plan:   Molly [name] is a/an 70 y.o. female with a past medical  history DM2, HTN, CKD 3b (BL 1.1-1.4), PAD who presented w/ NSTEMI, htn, and AKI  # Oliguric AKI on CKD 3b:  - Likely secondary to ischemic ATN/overcorrection of blood pressure with intermittent hypotension.  Renal artery PVL shows relatively small left kidney (9cm compared to 11.2 on right) but no left RAS.  BL Cr around 1.1-1.4.  - UP/C 0.68 - Continue supportive care - hold IVFs at this time - Currently no indication for HD;  - ordered strict ins/outs - renal US 5/21 neg - I do not think that sodium bicarbonate is indicated at the current time, increase minute ventilation will be effective  #Acute hypercapnic respiratory failure on BiPAP, per primary; Lasix dosing likely inadequate, increase to 80 IV twice daily  # Hypertension:  - Admitted with hypertensive emergency with hypotension intermittently.    Stable now on carvedilol- Goal for systolic blood pressure around 130-150 unless higher blood pressure exacerbates cardiac symptoms.   # Anemia normocytic:  - Hemoglobin 10.3  - check iron stores    # Hyperkalemia:  - l potassium 5.0, stable .  Lokelma, provides a significant sodium load  # DM2: Management per primary team   Rexene Agent, MD 09/05/2020 9:22 AM

## 2020-09-05 NOTE — Progress Notes (Addendum)
PROGRESS NOTE  Molly [name] [id number] DOB: 1950/09/29 DOA: 08/31/2020 PCP: [name], MD  HPI/Recap of past 24 hours: Molly [name] is a 70 y.o. female with medical history significant of DM2, HTN, CKD3b, PAD. Presenting with left-sided nonradiating chest pain associated with dyspnea and nausea.  Work-up in the ED revealed hypertensive emergency with SBP in the 200s and elevated troponin.  She was started on heparin drip and cardiology was consulted.  Seen by cardiology.  Due to worsening renal function heart cath was postponed.  She had a VQ scan that was negative for pulmonary embolism.  Hospital course complicated by worsening AKI on CKD 3B with oliguria.  Ongoing gentle IV fluid hydration.  Nephrology following.  Worsening hypoxia.  09/05/20: Patient developed some confusion overnight pulling off her nasal cannula.  An arterial blood gas was done which revealed respiratory acidosis with pH of 7.194, PCO2 56.2, PaO2 67.5 on 3 L nasal cannula.  Chest x-ray personally reviewed showing pulmonary edema and bilateral pleural effusions.  Ordered 1 dose of IV Lasix 20 mg x 1.  She was placed on BiPAP, received 2 A of bicarb and was started on isotonic bicarb drip with judicious rate of 10 cc/h due to pulmonary edema.  Assessment/Plan: Active Problems:   Chest pain  Chest pain rule out ACS Trop 91, 95 Seen by cardiology, AKI precluded heart cath. She received 48 hours of heparin drip, not discontinued.  She is currently off nitroglycerin drip. 2D echo done on 08/31/2020, the left ventricle has no regional wall motion abnormality, moderate concentric LVH Management per cardiology. Aspirin 81 mg daily, Pletal 50 mg twice daily, Zetia 10 mg daily, Crestor 40 mg daily. Dose of Coreg was reduced to 12.5 mg twice daily then held to allow for permissive hypertension with goal of SBP greater than 130 as recommended by nephrology. Continue to monitor on telemetry  Severe acute hypoxic  hypercarbic respiratory failure likely multifactorial secondary to pulmonary edema suspected underlying undiagnosed OSA Not on oxygen supplementation at baseline Personally reviewed chest x-ray done on 09/05/2020 which shows pulmonary edema and bilateral pleural effusions. Arterial blood gas taken on 09/05/2020 revealing pH of 7.194, PCO2 56.2, PaO2 67.5 on 3 L nasal cannula.  Received 1 dose of IV Lasix 20 mg x 1 Started on BiPAP, n.p.o. while on BiPAP. Repeat arterial blood gas 2 hours after BiPAP. Curbsided with pulmonary.  Patient will likely need polysomnography outpatient.  Arranging for follow up with Dr. [name]. Maintaining saturation greater than 90%. At time of this visit the patient is alert and oriented x3. Allow for breaks from BiPAP if pH has improved to at least 7.3 BiPAP at night and during naps.  Worsening AKI on CKD 3B likely contributed by hypotension. Creatinine is uptrending 3.13 from 2.96. Presented with creatinine of 1.38. Continue to avoid nephrotoxics and hypotension Allow for permissive hypertension with goal SBP 130-150 as recommended by nephrology. Continue to closely monitor urine output. Nephrology is following.  Improving hyperkalemia Continue Lokelma 10 g daily x2 days. Continue renal diet, carbmodified as recommended by nephrology.  Daytime somnolence, fatigue suspect undiagnosed OSA. Will need to follow-up with pulmonary and have polysomnography  Resolved hypertensive emergency Presented with SBP in the 200 with elevated troponin BP is improved. Norvasc on hold to avoid hypotension in the setting of AKI. Coreg dose has been reduced to 12.5 mg twice daily. Continue to closely monitor vital signs.  Chronic dCHF Last 2D echo 08/31/20 LVEF 50-55% G1DD Continue strict I&O  Continue Daily weight Net I&O +4.7 L Management per cardiology  Anemia of chronic disease Hemoglobin uptrending. Hemoglobin 10.5 from 10.3. No overt bleeding Continue to  monitor H&H  Hyperlipidemia/GERD/neuropathy Continue home regimen  IBS with diarrhea Continue home regimen    Code Status: Full code  Family Communication: We will update husband via phone.  Disposition Plan: Likely will discharge to home when cardiology and nephrology sign off.   Consultants:  Cardiology.  Nephrology  Curbsided with Pulmonary on 09/05/20.  Procedures:  2D echo  Antimicrobials:  None.  DVT prophylaxis: Subcu heparin 3 times daily  Status is: Inpatient    Dispo: The patient is from: Home               Anticipated d/c is to: Home when cardiology and nephrology sign off.               Patient currently not stable for discharge due to ongoing management of most recent chest pain, rule out ACS.   Difficult to place patient, not applicable.         Objective: Vitals:   09/05/20 0400 09/05/20 0500 09/05/20 0718 09/05/20 0733  BP: (!) 145/65 (!) 144/49    Pulse: 68 67  70  Resp: 20 (!) 28  19  Temp: 98 F (36.7 C)  97.6 F (36.4 C)   TempSrc: Oral  Axillary   SpO2: (!) 88% 91%  98%  Weight:      Height:        Intake/Output Summary (Last 24 hours) at 09/05/2020 0739 Last data filed at 09/05/2020 [id number] Gross per 24 hour  Intake 1442.48 ml  Output 765 ml  Net 677.48 ml   Filed Weights   08/31/20 0650  Weight: 72.1 kg    Exam:  . General: 70 y.o. year-old female frail-appearing in no acute distress.  She is alert oriented x3.  Now on BiPAP.   Marland Kitchen Cardiovascular: Regular rate and rhythm no rubs or gallops. Marland Kitchen Respiratory: Diffuse rales bilaterally.  No wheezing noted.  Poor respiratory effort.   . Abdomen: Soft with a normal bowel sounds present. .  Musculoskeletal: Normal extremity edema bilaterally.   . Skin: No open lesions in lower extremities bilaterally. Marland Kitchen Psychiatry: Mood is appropriate for condition and setting.   Data Reviewed: CBC: Recent Labs  Lab 08/31/20 0720 09/01/20 0042 09/02/20 0257 09/04/20 0219  09/05/20 0012  WBC 8.9 8.5 5.8 5.6 5.7  HGB 12.4 10.7* 9.5* 10.3* 10.5*  HCT 40.2 35.1* 31.9* 34.7* 35.6*  MCV 88.4 89.3 91.4 90.4 91.3  PLT 250 234 203 256 [id number]   Basic Metabolic Panel: Recent Labs  Lab 09/01/20 1552 09/02/20 0257 09/03/20 0302 09/04/20 0219 09/05/20 0012  NA 138 135 139 137 138  K 4.3 4.9 5.4* 5.3* 5.0  CL 106 108 106 105 107  CO2 '23 24 29 '$ 20* 22  GLUCOSE 149* 187* 199* 139* 136*  BUN 38* 46* 56* 64* 72*  CREATININE 1.88* 2.24* 3.03* 2.96* 3.13*  CALCIUM 8.2* 7.8* 7.8* 8.4* 8.4*  MG  --   --  2.1  --   --   PHOS  --   --  5.8*  --   --    GFR: Estimated Creatinine Clearance: 16 mL/min (A) (by C-G formula based on SCr of 3.13 mg/dL (H)). Liver Function Tests: Recent Labs  Lab 09/03/20 0302 09/05/20 0012  AST 22 29  ALT 19 22  ALKPHOS 58 69  BILITOT 0.3 0.6  PROT 6.6  7.1  ALBUMIN 3.2* 3.6   No results for input(s): LIPASE, AMYLASE in the last 168 hours. Recent Labs  Lab 09/05/20 0015  AMMONIA 14   Coagulation Profile: No results for input(s): INR, PROTIME in the last 168 hours. Cardiac Enzymes: No results for input(s): CKTOTAL, CKMB, CKMBINDEX, TROPONINI in the last 168 hours. BNP (last 3 results) No results for input(s): PROBNP in the last 8760 hours. HbA1C: No results for input(s): HGBA1C in the last 72 hours. CBG: Recent Labs  Lab 09/03/20 2032 09/04/20 0759 09/04/20 1208 09/04/20 1627 09/04/20 2130  GLUCAP 147* 128* 178* 123* 112*   Lipid Profile: No results for input(s): CHOL, HDL, LDLCALC, TRIG, CHOLHDL, LDLDIRECT in the last 72 hours. Thyroid Function Tests: No results for input(s): TSH, T4TOTAL, FREET4, T3FREE, THYROIDAB in the last 72 hours. Anemia Panel: Recent Labs    09/04/20 1019  FERRITIN 91  TIBC 248*  IRON 38   Urine analysis:    Component Value Date/Time   COLORURINE YELLOW 06/29/2015 1636   APPEARANCEUR HAZY (A) 06/29/2015 1636   LABSPEC 1.030 06/29/2015 1636   PHURINE 5.5 06/29/2015 1636   GLUCOSEU  >1000 (A) 06/29/2015 1636   HGBUR NEGATIVE 06/29/2015 1636   BILIRUBINUR NEGATIVE 06/29/2015 1636   KETONESUR NEGATIVE 06/29/2015 1636   PROTEINUR NEGATIVE 06/29/2015 1636   NITRITE NEGATIVE 06/29/2015 1636   LEUKOCYTESUR NEGATIVE 06/29/2015 1636   Sepsis Labs: '@LABRCNTIP'$ (procalcitonin:4,lacticidven:4)  ) Recent Results (from the past 240 hour(s))  SARS CORONAVIRUS 2 (TAT 6-24 HRS) Nasopharyngeal Nasopharyngeal Swab     Status: None   Collection Time: 08/31/20  9:34 AM   Specimen: Nasopharyngeal Swab  Result Value Ref Range Status   SARS Coronavirus 2 NEGATIVE NEGATIVE Final    Comment: (NOTE) SARS-CoV-2 target nucleic acids are NOT DETECTED.  The SARS-CoV-2 RNA is generally detectable in upper and lower respiratory specimens during the acute phase of infection. Negative results do not preclude SARS-CoV-2 infection, do not rule out co-infections with other pathogens, and should not be used as the sole basis for treatment or other patient management decisions. Negative results must be combined with clinical observations, patient history, and epidemiological information. The expected result is Negative.  Fact Sheet for Patients: SugarRoll.be  Fact Sheet for Healthcare Providers: https://www.woods-mathews.com/  This test is not yet approved or cleared by the [place] FDA and  has been authorized for detection and/or diagnosis of SARS-CoV-2 by FDA under an Emergency Use Authorization (EUA). This EUA will remain  in effect (meaning this test can be used) for the duration of the COVID-19 declaration under Se ction 564(b)(1) of the Act, 21 U.S.C. section 360bbb-3(b)(1), unless the authorization is terminated or revoked sooner.  Performed at [facility], [place] [address], [place], [place] [address]   MRSA PCR Screening     Status: None   Collection Time: 08/31/20  6:30 PM   Specimen: Nasopharyngeal  Result Value Ref Range  Status   MRSA by PCR NEGATIVE NEGATIVE Final    Comment:        The GeneXpert MRSA Assay (FDA approved for NASAL specimens only), is one component of a comprehensive MRSA colonization surveillance program. It is not intended to diagnose MRSA infection nor to guide or monitor treatment for MRSA infections. Performed at [facility], [place] [address], [place], [place] [address]       Studies: US RENAL  Result Date: 09/04/2020 CLINICAL DATA:  Acute renal insufficiency EXAM: RENAL / URINARY TRACT ULTRASOUND COMPLETE COMPARISON:  None. FINDINGS: Right Kidney: Renal measurements: 9.8 x 4.8 x 4.7 cm = volume: 115 mL. Echogenicity within normal limits. No mass or hydronephrosis visualized. Left Kidney: Renal measurements: 9.8 x 4.5 x 4.7 cm = volume: 108 mL. Echogenicity within normal limits. No mass or hydronephrosis visualized. Bladder: Appears normal for degree of bladder distention. Other: None. IMPRESSION: No abnormalities identified. No cause for the patient's symptoms identified. Electronically Signed   By: Dorise Bullion III M.D   On: 09/04/2020 13:39    Scheduled Meds: . aspirin  81 mg Oral Daily  . carvedilol  12.5 mg Oral BID WC  . cetirizine  5 mg Oral QHS  . Chlorhexidine Gluconate Cloth  6 each Topical Daily  . cholecalciferol  2,000 Units Oral Daily  . cilostazol  50 mg Oral BID  . ezetimibe  10 mg Oral Daily  . famotidine  20 mg Oral Daily  . furosemide  20 mg Intravenous Once  . gabapentin  100 mg Oral TID  . heparin injection (subcutaneous)  5,000 Units Subcutaneous Q8H  . insulin aspart  0-5 Units Subcutaneous QHS  . insulin aspart  0-9 Units Subcutaneous TID WC  . lipase/protease/amylase  72,000 Units Oral TID with meals  . mouth rinse  15 mL Mouth Rinse BID  . rosuvastatin  40 mg Oral Daily  . sodium bicarbonate  100 mEq Intravenous Once  . sodium chloride flush  3 mL Intravenous Q12H  . sodium zirconium cyclosilicate  10 g Oral Daily     Continuous Infusions: . sodium chloride    .  sodium bicarbonate (isotonic) infusion in sterile water       LOS: 5 days     Kayleen Memos, MD [facility] Pager [phone]  If 7PM-7AM, please contact night-coverage [email or web address] Password [facility] 09/05/2020, 7:39 AM

## 2020-09-06 DIAGNOSIS — N179 Acute kidney failure, unspecified: Secondary | ICD-10-CM

## 2020-09-06 DIAGNOSIS — R778 Other specified abnormalities of plasma proteins: Secondary | ICD-10-CM

## 2020-09-06 DIAGNOSIS — R079 Chest pain, unspecified: Secondary | ICD-10-CM | POA: Diagnosis not present

## 2020-09-06 DIAGNOSIS — I161 Hypertensive emergency: Secondary | ICD-10-CM | POA: Diagnosis not present

## 2020-09-06 LAB — RENAL FUNCTION PANEL
Albumin: 3.5 g/dL (ref 3.5–5.0)
Anion gap: 13 (ref 5–15)
BUN: 74 mg/dL — ABNORMAL HIGH (ref 8–23)
CO2: 22 mmol/L (ref 22–32)
Calcium: 8.5 mg/dL — ABNORMAL LOW (ref 8.9–10.3)
Chloride: 105 mmol/L (ref 98–111)
Creatinine, Ser: 2.72 mg/dL — ABNORMAL HIGH (ref 0.44–1.00)
GFR, Estimated: 18 mL/min — ABNORMAL LOW (ref >60)
Glucose, Bld: 117 mg/dL — ABNORMAL HIGH (ref 70–99)
Phosphorus: 5.1 mg/dL — ABNORMAL HIGH (ref 2.5–4.6)
Potassium: 4.2 mmol/L (ref 3.5–5.1)
Sodium: 140 mmol/L (ref 135–145)

## 2020-09-06 LAB — GLUCOSE, CAPILLARY
Glucose-Capillary: 126 mg/dL — ABNORMAL HIGH (ref 70–99)
Glucose-Capillary: 107 mg/dL — ABNORMAL HIGH (ref 70–99)
Glucose-Capillary: 187 mg/dL — ABNORMAL HIGH (ref 70–99)
Glucose-Capillary: 129 mg/dL — ABNORMAL HIGH (ref 70–99)

## 2020-09-06 LAB — CBC
HCT: 34.6 % — ABNORMAL LOW (ref 36.0–46.0)
Hemoglobin: 10.4 g/dL — ABNORMAL LOW (ref 12.0–15.0)
MCH: 27.2 pg (ref 26.0–34.0)
MCHC: 30.1 g/dL (ref 30.0–36.0)
MCV: 90.3 fL (ref 80.0–100.0)
Platelets: 292 10*3/uL (ref 150–400)
RBC: 3.83 MIL/uL — ABNORMAL LOW (ref 3.87–5.11)
RDW: 14.6 % (ref 11.5–15.5)
WBC: 6.4 10*3/uL (ref 4.0–10.5)
nRBC: 0 % (ref 0.0–0.2)

## 2020-09-06 NOTE — Progress Notes (Signed)
[facility] Progress Note   70 y.o.femalewith a past medical history DM2, HTN, CKD 3b (BL 1.1-1.4), PADwho presented w/ NSTEMI, htn, and AKI  Assessment/ Plan:   # Oliguric AKI on CKD 3b: - Likely secondary to ischemic ATN/overcorrection of blood pressure with intermittent hypotension.  Renal artery PVL shows relatively small left kidney (9cm compared to 11.2 on right) but no left RAS.  BL Cr around 1.1-1.4. - UP/C 0.68 - Continue supportive care, hold IVFs at this time and fortunately no indication for HD;  - Strict ins/outs w/ no e/o obstruction on renal US 5/21   TTE dCHF Grade I.Started on diuresis as well.  #Acute hypercapnic respiratory failure on BiPAP, per primary; Lasix dosing likely inadequate, increase to 80 IV twice daily  # Hypertension:  - Admitted with hypertensive emergency with hypotension intermittently.   Stable now on carvedilol- Goal for systolicblood pressure [id number] unless higher blood pressure exacerbates cardiac symptoms.   # Anemia normocytic:  - Hemoglobin 10.3  - check iron stores    # Hyperkalemia:  - l potassium 5.0, stable .  Lokelma, provides a significant sodium load  # DM2: Management per primary team  Subjective:   Feeling better. Mildly dyspneic but better with the mask off. Denies f/c/ n/v.   Objective:   BP (!) 141/91   Pulse 71   Temp 97.6 F (36.4 C) (Oral)   Resp (!) 25   Ht '5\' 2"'$  (1.575 m)   Wt 79.7 kg   SpO2 93%   BMI 32.14 kg/m   Intake/Output Summary (Last 24 hours) at 09/06/2020 1230 Last data filed at 09/06/2020 1100 Gross per 24 hour  Intake 240 ml  Output --  Net 240 ml   Weight change:   Physical Exam: General adult female in bed in no acute distress HEENT NCAT Neck supple trachea midline Lungs clear to auscultation bilaterally normal work of breathing at rest  Heart S1S2 no rub Abdomen soft nontender nondistended Extremities no edema appreciated Psych normal mood and  affect Neuro more alert today  Imaging: DG CHEST PORT 1 VIEW  Result Date: 09/05/2020 CLINICAL DATA:  Hypoxia.  Chest pain. EXAM: PORTABLE CHEST 1 VIEW COMPARISON:  Aug 31, 2020 FINDINGS: The study is limited due to the low volume portable technique. Haziness over both lungs may be at least partially technical in nature. Obscuration of the right hemidiaphragm suggest the possibility of a small layering effusion. The left basilar opacity persists but has improved. No other interval changes. IMPRESSION: The study is limited by technique. Suspected improving infiltrate in the left base. Possible small layering effusion on the right. No other acute interval changes. Electronically Signed   By: Dorise Bullion III M.D   On: 09/05/2020 09:59    Labs: BMET Recent Labs  Lab 09/01/20 [id number] 09/01/20 1552 09/02/20 [id number] 09/03/20 0302 09/04/20 0219 09/05/20 0012 09/06/20 0224  NA 139 138 135 139 137 138 140  K 4.4 4.3 4.9 5.4* 5.3* 5.0 4.2  CL 105 106 108 106 105 107 105  CO2 '26 23 24 29 '$ 20* 22 22  GLUCOSE 170* 149* 187* 199* 139* 136* 117*  BUN 32* 38* 46* 56* 64* 72* 74*  CREATININE 1.71* 1.88* 2.24* 3.03* 2.96* 3.13* 2.72*  CALCIUM 8.6* 8.2* 7.8* 7.8* 8.4* 8.4* 8.5*  PHOS  --   --   --  5.8*  --   --  5.1*   CBC Recent Labs  Lab 09/02/20 0257 09/04/20 0219 09/05/20 0012 09/06/20 0224  WBC 5.8 5.6 5.7 6.4  HGB 9.5* 10.3* 10.5* 10.4*  HCT 31.9* 34.7* 35.6* 34.6*  MCV 91.4 90.4 91.3 90.3  PLT 203 256 290 292    Medications:    . aspirin  81 mg Oral Daily  . carvedilol  12.5 mg Oral BID WC  . cetirizine  5 mg Oral QHS  . Chlorhexidine Gluconate Cloth  6 each Topical Daily  . cholecalciferol  2,000 Units Oral Daily  . cilostazol  50 mg Oral BID  . ezetimibe  10 mg Oral Daily  . famotidine  20 mg Oral Daily  . furosemide  80 mg Intravenous BID  . gabapentin  100 mg Oral TID  . heparin injection (subcutaneous)  5,000 Units Subcutaneous Q8H  . insulin aspart  0-5 Units  Subcutaneous QHS  . insulin aspart  0-9 Units Subcutaneous TID WC  . lipase/protease/amylase  72,000 Units Oral TID with meals  . mouth rinse  15 mL Mouth Rinse BID  . rosuvastatin  40 mg Oral Daily  . sodium chloride flush  3 mL Intravenous Q12H      Otelia Santee, MD 09/06/2020, 12:30 PM

## 2020-09-06 NOTE — Progress Notes (Addendum)
Progress Note  Patient Name: Molly [name] Date of Encounter: 09/06/2020  [facility] Cardiologist: Jenne Campus, MD   Subjective   Feeling okay this morning. Breathing improved from yesterday. Sitting on the edge of her hospital bed this morning after using bedside commode and still appears mildly SOB on O2 via [place]. No recurrent chest pain. No new complaints.   Inpatient Medications    Scheduled Meds: . aspirin  81 mg Oral Daily  . carvedilol  12.5 mg Oral BID WC  . cetirizine  5 mg Oral QHS  . Chlorhexidine Gluconate Cloth  6 each Topical Daily  . cholecalciferol  2,000 Units Oral Daily  . cilostazol  50 mg Oral BID  . ezetimibe  10 mg Oral Daily  . famotidine  20 mg Oral Daily  . furosemide  80 mg Intravenous BID  . gabapentin  100 mg Oral TID  . heparin injection (subcutaneous)  5,000 Units Subcutaneous Q8H  . insulin aspart  0-5 Units Subcutaneous QHS  . insulin aspart  0-9 Units Subcutaneous TID WC  . lipase/protease/amylase  72,000 Units Oral TID with meals  . mouth rinse  15 mL Mouth Rinse BID  . rosuvastatin  40 mg Oral Daily  . sodium chloride flush  3 mL Intravenous Q12H   Continuous Infusions: . sodium chloride     PRN Meds: sodium chloride, acetaminophen, ALPRAZolam, diphenhydrAMINE, hydrALAZINE, ipratropium-albuterol, loperamide, nitroGLYCERIN, ondansetron (ZOFRAN) IV, sodium chloride flush   Vital Signs    Vitals:   09/06/20 0400 09/06/20 0500 09/06/20 0600 09/06/20 0700  BP: (!) 154/44 (!) 158/46 (!) 163/50 (!) 162/110  Pulse: 71 66 70 72  Resp: 19 18 (!) 21 (!) 25  Temp: 97.7 F (36.5 C)     TempSrc: Axillary     SpO2: 95% 96% 98% 95%  Weight:  79.7 kg    Height:        Intake/Output Summary (Last 24 hours) at 09/06/2020 0809 Last data filed at 09/05/2020 0857 Gross per 24 hour  Intake 165.55 ml  Output --  Net 165.55 ml   Last 3 Weights 09/06/2020 08/31/2020 06/03/2020  Weight (lbs) 175 lb 11.3 oz 159 lb 157 lb 9.6 oz  Weight (kg)  79.7 kg 72.122 kg 71.487 kg      Telemetry    No new tracings - Personally Reviewed  ECG    Sinus rhythm with occasional PACs/PVCs - Personally Reviewed  Physical Exam   GEN: No acute distress.   Neck: No JVD Cardiac: RRR, no murmurs, rubs, or gallops.  Respiratory: faint crackles at lung bases L>R. GI: Soft, nontender, non-distended  MS: trace LE edema; No deformity. Neuro:  Nonfocal  Psych: Normal affect   Labs    High Sensitivity Troponin:   Recent Labs  Lab 08/31/20 0720 08/31/20 0934  TROPONINIHS 91* 95*      Chemistry Recent Labs  Lab 09/03/20 0302 09/04/20 0219 09/05/20 0012 09/06/20 0224  NA 139 137 138 140  K 5.4* 5.3* 5.0 4.2  CL 106 105 107 105  CO2 29 20* 22 22  GLUCOSE 199* 139* 136* 117*  BUN 56* 64* 72* 74*  CREATININE 3.03* 2.96* 3.13* 2.72*  CALCIUM 7.8* 8.4* 8.4* 8.5*  PROT 6.6  --  7.1  --   ALBUMIN 3.2*  --  3.6 3.5  AST 22  --  29  --   ALT 19  --  22  --   ALKPHOS 58  --  69  --  BILITOT 0.3  --  0.6  --   GFRNONAA 16* 17* 16* 18*  ANIONGAP 4* '12 9 13     '$ Hematology Recent Labs  Lab 09/04/20 0219 09/05/20 0012 09/06/20 0224  WBC 5.6 5.7 6.4  RBC 3.84* 3.90 3.83*  HGB 10.3* 10.5* 10.4*  HCT 34.7* 35.6* 34.6*  MCV 90.4 91.3 90.3  MCH 26.8 26.9 27.2  MCHC 29.7* 29.5* 30.1  RDW 14.5 14.6 14.6  PLT 256 290 292    BNP Recent Labs  Lab 08/31/20 0720 09/05/20 0619  BNP 421.0* 1,047.3*     DDimer  Recent Labs  Lab 08/31/20 0720  DDIMER 0.83*     Radiology    US RENAL  Result Date: 09/04/2020 CLINICAL DATA:  Acute renal insufficiency EXAM: RENAL / URINARY TRACT ULTRASOUND COMPLETE COMPARISON:  None. FINDINGS: Right Kidney: Renal measurements: 9.8 x 4.8 x 4.7 cm = volume: 115 mL. Echogenicity within normal limits. No mass or hydronephrosis visualized. Left Kidney: Renal measurements: 9.8 x 4.5 x 4.7 cm = volume: 108 mL. Echogenicity within normal limits. No mass or hydronephrosis visualized. Bladder: Appears  normal for degree of bladder distention. Other: None. IMPRESSION: No abnormalities identified. No cause for the patient's symptoms identified. Electronically Signed   By: Dorise Bullion III M.D   On: 09/04/2020 13:39   DG CHEST PORT 1 VIEW  Result Date: 09/05/2020 CLINICAL DATA:  Hypoxia.  Chest pain. EXAM: PORTABLE CHEST 1 VIEW COMPARISON:  Aug 31, 2020 FINDINGS: The study is limited due to the low volume portable technique. Haziness over both lungs may be at least partially technical in nature. Obscuration of the right hemidiaphragm suggest the possibility of a small layering effusion. The left basilar opacity persists but has improved. No other interval changes. IMPRESSION: The study is limited by technique. Suspected improving infiltrate in the left base. Possible small layering effusion on the right. No other acute interval changes. Electronically Signed   By: Dorise Bullion III M.D   On: 09/05/2020 09:59    Cardiac Studies   Echocardiogram 08/31/20: 1. Left ventricular ejection fraction, by estimation, is 50 to 55%. Left  ventricular ejection fraction by 3D volume is 51 %. The left ventricle has  low normal function. The left ventricle has no regional wall motion  abnormalities. There is moderate  concentric left ventricular hypertrophy. Left ventricular diastolic  parameters are consistent with Grade I diastolic dysfunction (impaired  relaxation).  2. Right ventricular systolic function is normal. The right ventricular  size is normal.  3. The pericardial effusion is circumferential. There is no evidence of  cardiac tamponade.  4. The mitral valve is grossly normal. Trivial mitral valve  regurgitation. No evidence of mitral stenosis.  5. The aortic valve is tricuspid. There is mild calcification of the  aortic valve. There is mild thickening of the aortic valve. Aortic valve  regurgitation is not visualized. No aortic stenosis is present.  6. The inferior vena cava is normal in  size with greater than 50%  respiratory variability, suggesting right atrial pressure of 3 mmHg.   Comparison(s): A prior study was performed on 11/11/2019. No significant  change from prior study. Prior images reviewed side by side.   Renal artery [place] 09/03/20: Right: Upper range 1-59% stenosis of the right renal artery by     velocities of visualized segments. RRV flow present.  Left: No evidence of hemodynamically significant left renal artery     stenosis. LRV flow present. Abnormal size for the left  kidney.   Abdominal aortogram 08/28/2019 -90% proximal left renal artery stenosis -70% proximal left SFA, 100% mid left SFA -100% left popliteal occlusion -100% mid right SFA   Patient Profile   70 y.o. female with a PMH of PAD (CTO of left SFA and popliteal artery 08/2019), Left renal artery stenosis, HTN, HLD, DM type 2, and CKD stage 3, who is being followed by cardiology for chest pain and elevated troponins.   Assessment & Plan    1. Chest pain and elevated troponin in patient with coronary artery calcifications: patient presented with chest pain which woke her from sleep 08/31/20. BP was markedly elevated on arrival. EKG showed no ischemic changes. HsTrop was elevated to 91>95 - trend not c/w ACS. BNP was elevated to 421. CXR suggested possible PNA, with CT suggesting atelectasis vs PNA. She remained afebrile, no leukocytosis, and procal was negative suggesting no acute infections. She was started on a nitro gtt with resolution of chest pain, now off. Echo showed EF 50-55%, no RWMA, moderate concentric LVH, trivial pericardial effusion, and no significant valvular abnormalities. It was felt that her chest pain was likely demand ischemia in the setting of hypertensive crisis, though suspicions are quite high for CAD given extensive coronary artery calcifications on CT, as well as history of PAD, poorly controlled HTN, HLD, and DM type 2. Initially planned to undergo  LHC, however with decline in kidney function, cath has been delayed indefinitely. She received 48 hr IV heparin, now on aspirin, statin, and BBlocker.   - Continue aspirin and statin - Continue BBlocker - Could consider outpatient NST to r/o ischemia.  2. Hypertensive crisis: BP markedly elevated to 200s/90s on admission. Likely contributed to #1. She has known left renal artery stenosis, though this was not felt to have contributed to her possible flash pulmonary edema. Repeat renal dopplers this admission without left RAS but showed 1-59% right RAS. BP has been quite labile. She was transitioned form home regimen of amlodipine, metoprolol, and losartan to carvedilol, though with NPO status, has not received po antihypertensives, though has been given 2 doses of IV hydralazine '5mg'$   - Continue carvedilol 12.'5mg'$  BID as tolerated - goal SBP 130-150s per nephrology  3. Acute hypercarbic/hypoxic respiratory failure: worsening O2 status yesterday, stable on BiPAP. ABG with acidosis and CO2 56. Lactate wnl. IVF's stopped and she was started on IV lasix '80mg'$  BID per nephrology. I&Os incomplete with UOP not documented. Weight is 175lbs today, up from 159lbs on admission. Still with faint crackles at lung bases on exam today - Continue diuresis per nephrology.  - Continue to monitor strict I&Os and daily weights - Continue to monitor electrolytes closely and replete as needed to maintain K >4, Mg >2  4. PAD: no claudication complaints. - continue aspirin and statin  5. HLD: LDL 107 this admission on crestor '40mg'$  daily; goal >70 - Continue crestor - May benefit from referral to lipid clinic as it is unlikely she will reach goal with addition of zetia.   6. DM type 2: A1C 7.0 this admission  - Continue ISS per primary team - Could consider addition of SGLT-2 inhibitor.   7. Acute on chronic renal insufficiency: Cr peaked at 3.13, down to 2.72 today, though up from baseline 1.3. Nephrology following.  Felt to be 2/2 ischemic ATN/overcorrection of BP with intermittent hypotension. Renal artery [place] with mild-moderate right RAS with small left kidney, felt to be non-contributory to her AKI. Received IVF's, though now being diuresed after developing  acute respiratory failure and pulmonary edema.  - Appreciate nephrology assistance with diuresis.  - Continue to monitor renal function closely    For questions or updates, please contact [place] Please consult [email or web address] for contact info under       Signed, Abigail Butts, PA-C  09/06/2020, 8:09 AM     Patient seen and examined.  Agree with above documentation.  On exam, patient is alert and oriented, regular rate and rhythm, no murmurs, lungs CTAB, no LE edema.  Telemetry shows NSR with rate 60-70s. Renal function improving with diuresis, continue per nephrology.  Plan likely outpatient nuclear stress test once recovers.  Donato Heinz, MD

## 2020-09-06 NOTE — Progress Notes (Signed)
PROGRESS NOTE  Molly [name] [id number] DOB: [birth year] DOA: 08/31/2020 PCP: [name], MD  HPI/Recap of past 24 hours: Molly [name] is a 70 y.o. female with medical history significant of DM2, HTN, CKD3b, PAD. Presenting with left-sided nonradiating chest pain associated with dyspnea and nausea.  Work-up in the ED revealed hypertensive emergency with SBP in the 200s and elevated troponin.  She was started on heparin drip and cardiology was consulted.  Seen by cardiology.  Due to worsening renal function heart cath was postponed.  She had a VQ scan that was negative for pulmonary embolism.  Hospital course complicated by worsening AKI on CKD 3B with oliguria.  Acute hypoxic hypercarbic respiratory failure secondary to pulmonary edema and possible undiagnosed OSA.  Respiratory status improved with BiPAP as needed during the day and BiPAP at night.  09/06/20: Patient was seen and examined at her bedside this morning.  She states she feels better today.  There were no acute events overnight.  She still appears to have conversational dyspnea.  Assessment/Plan: Active Problems:   Chest pain  Chest pain rule out ACS Trop 91, 95 Seen by cardiology, AKI precluded heart cath. She received 48 hours of heparin drip, now discontinued.  Off nitroglycerin drip.   2D echo done on 08/31/2020, the left ventricle has no regional wall motion abnormality, moderate concentric LVH Aspirin 81 mg daily, Pletal 50 mg twice daily, Zetia 10 mg daily, Crestor 40 mg daily, Coreg 12.5 mg twice daily. Permissive hypertension with goal SBP between 130 and 150.  Nephrology.  Severe acute hypoxic hypercarbic respiratory failure likely multifactorial secondary to pulmonary edema suspected underlying undiagnosed OSA Not on oxygen supplementation at baseline Personally reviewed chest x-ray done on 09/05/2020 which shows pulmonary edema and bilateral pleural effusions. Arterial blood gas taken on 09/05/2020  revealing pH of 7.194, PCO2 56.2, PaO2 67.5 on 3 L nasal cannula.  Repeated arterial blood gas 7.330/46.5/62.4 on 40% FiO2 on BiPAP. Received 1 dose of IV Lasix 20 mg x 1 Started on IV Lasix 80 mg twice daily per nephrology. Continue BiPAP as needed during the day and nightly. Curbsided with pulmonary.  Patient will likely need polysomnography outpatient.  Arranging for follow up with Dr. [name].  Essential hypertension, on admission hypertensive emergency. Initially treated with nitroglycerin drip.  May have contributed to AKI. She is currently on Coreg 12.5 mg twice daily and IV Lasix 80 mg twice daily. Continue to closely monitor vital signs  AKI on CKD 3B likely contributed by hypotension. Creatinine is downtrending from 3.13 to 2.72. Continue to closely monitor urine output and electrolytes. Presented with creatinine of 1.38. Continue to avoid nephrotoxic agents Continue daily renal panel.  Resolved post treatment: Hyperkalemia She has received Lokelma.   Continue renal diet, carbmodified as recommended by nephrology.  Daytime somnolence, fatigue suspect undiagnosed OSA. Will need to follow-up with pulmonary and have polysomnography Arranging follow-up appointment with Dr. [name]  Chronic dCHF Last 2D echo 08/31/20 LVEF 50-55% G1DD Continue strict I&O Continue Daily weight Net I&O +4.9 L Management per cardiology  Anemia of chronic disease Hemoglobin uptrending. Hemoglobin stable 10.4 from 10.5 from 10.3. No overt bleeding Continue to monitor H&H  Hyperlipidemia/GERD/neuropathy Continue home regimen  IBS with diarrhea Continue home regimen    Code Status: Full code  Family Communication: We will update husband via phone.  Disposition Plan: Likely will discharge to home when cardiology and nephrology sign off.   Consultants:  Cardiology.  Nephrology  Curbsided with Pulmonary on  09/05/20.  Procedures:  2D echo  Antimicrobials:  None.  DVT  prophylaxis: Subcu heparin 3 times daily  Status is: Inpatient    Dispo: The patient is from: Home               Anticipated d/c is to: Home when cardiology and nephrology sign off.               Patient currently not stable for discharge due to ongoing management of most recent chest pain, rule out ACS.   Difficult to place patient, not applicable.         Objective: Vitals:   09/06/20 0600 09/06/20 0700 09/06/20 0804 09/06/20 0858  BP: (!) 163/50 (!) 162/110  (!) 156/49  Pulse: 70 72    Resp: (!) 21 (!) 25    Temp:   97.7 F (36.5 C)   TempSrc:   Oral   SpO2: 98% 95%    Weight:      Height:       No intake or output data in the 24 hours ending 09/06/20 0907 Filed Weights   08/31/20 0650 09/06/20 0500  Weight: 72.1 kg 79.7 kg    Exam:  . General: 70 y.o. year-old female frail-appearing no acute distress.  She is alert oriented x3.  On nasal cannula.   . Cardiovascular: Regular rate and rhythm no rubs or gallops. Marland Kitchen Respiratory: Mild rales at bases with no wheezing noted.  Poor inspiratory effort.   . Abdomen: Soft nontender normal bowel sounds present. .  Musculoskeletal: No lower extremity edema bilaterally.   . Skin: No ulcerative lesions noted. Marland Kitchen Psychiatry: Mood is appropriate for condition and setting.  Data Reviewed: CBC: Recent Labs  Lab 09/01/20 0042 09/02/20 0257 09/04/20 0219 09/05/20 0012 09/06/20 0224  WBC 8.5 5.8 5.6 5.7 6.4  HGB 10.7* 9.5* 10.3* 10.5* 10.4*  HCT 35.1* 31.9* 34.7* 35.6* 34.6*  MCV 89.3 91.4 90.4 91.3 90.3  PLT 234 203 256 290 [id number]   Basic Metabolic Panel: Recent Labs  Lab 09/02/20 0257 09/03/20 0302 09/04/20 0219 09/05/20 0012 09/06/20 0224  NA 135 139 137 138 140  K 4.9 5.4* 5.3* 5.0 4.2  CL 108 106 105 107 105  CO2 24 29 20* 22 22  GLUCOSE 187* 199* 139* 136* 117*  BUN 46* 56* 64* 72* 74*  CREATININE 2.24* 3.03* 2.96* 3.13* 2.72*  CALCIUM 7.8* 7.8* 8.4* 8.4* 8.5*  MG  --  2.1  --   --   --   PHOS  --  5.8*   --   --  5.1*   GFR: Estimated Creatinine Clearance: 19.3 mL/min (A) (by C-G formula based on SCr of 2.72 mg/dL (H)). Liver Function Tests: Recent Labs  Lab 09/03/20 0302 09/05/20 0012 09/06/20 0224  AST 22 29  --   ALT 19 22  --   ALKPHOS 58 69  --   BILITOT 0.3 0.6  --   PROT 6.6 7.1  --   ALBUMIN 3.2* 3.6 3.5   No results for input(s): LIPASE, AMYLASE in the last 168 hours. Recent Labs  Lab 09/05/20 0015  AMMONIA 14   Coagulation Profile: No results for input(s): INR, PROTIME in the last 168 hours. Cardiac Enzymes: No results for input(s): CKTOTAL, CKMB, CKMBINDEX, TROPONINI in the last 168 hours. BNP (last 3 results) No results for input(s): PROBNP in the last 8760 hours. HbA1C: No results for input(s): HGBA1C in the last 72 hours. CBG: Recent Labs  Lab 09/05/20 [phone]  09/05/20 1220 09/05/20 1648 09/05/20 2105 09/06/20 0759  GLUCAP 125* 110* 119* 108* 126*   Lipid Profile: No results for input(s): CHOL, HDL, LDLCALC, TRIG, CHOLHDL, LDLDIRECT in the last 72 hours. Thyroid Function Tests: No results for input(s): TSH, T4TOTAL, FREET4, T3FREE, THYROIDAB in the last 72 hours. Anemia Panel: Recent Labs    09/04/20 1019  FERRITIN 91  TIBC 248*  IRON 38   Urine analysis:    Component Value Date/Time   COLORURINE YELLOW 06/29/2015 1636   APPEARANCEUR HAZY (A) 06/29/2015 1636   LABSPEC 1.030 06/29/2015 1636   PHURINE 5.5 06/29/2015 1636   GLUCOSEU >1000 (A) 06/29/2015 1636   HGBUR NEGATIVE 06/29/2015 1636   BILIRUBINUR NEGATIVE 06/29/2015 1636   KETONESUR NEGATIVE 06/29/2015 1636   PROTEINUR NEGATIVE 06/29/2015 1636   NITRITE NEGATIVE 06/29/2015 1636   LEUKOCYTESUR NEGATIVE 06/29/2015 1636   Sepsis Labs: '@LABRCNTIP'$ (procalcitonin:4,lacticidven:4)  ) Recent Results (from the past 240 hour(s))  SARS CORONAVIRUS 2 (TAT 6-24 HRS) Nasopharyngeal Nasopharyngeal Swab     Status: None   Collection Time: 08/31/20  9:34 AM   Specimen: Nasopharyngeal Swab   Result Value Ref Range Status   SARS Coronavirus 2 NEGATIVE NEGATIVE Final    Comment: (NOTE) SARS-CoV-2 target nucleic acids are NOT DETECTED.  The SARS-CoV-2 RNA is generally detectable in upper and lower respiratory specimens during the acute phase of infection. Negative results do not preclude SARS-CoV-2 infection, do not rule out co-infections with other pathogens, and should not be used as the sole basis for treatment or other patient management decisions. Negative results must be combined with clinical observations, patient history, and epidemiological information. The expected result is Negative.  Fact Sheet for Patients: SugarRoll.be  Fact Sheet for Healthcare Providers: https://www.woods-mathews.com/  This test is not yet approved or cleared by the [place] FDA and  has been authorized for detection and/or diagnosis of SARS-CoV-2 by FDA under an Emergency Use Authorization (EUA). This EUA will remain  in effect (meaning this test can be used) for the duration of the COVID-19 declaration under Se ction 564(b)(1) of the Act, 21 U.S.C. section 360bbb-3(b)(1), unless the authorization is terminated or revoked sooner.  Performed at [facility], [place] [address] [place], [place] [address]   MRSA PCR Screening     Status: None   Collection Time: 08/31/20  6:30 PM   Specimen: Nasopharyngeal  Result Value Ref Range Status   MRSA by PCR NEGATIVE NEGATIVE Final    Comment:        The GeneXpert MRSA Assay (FDA approved for NASAL specimens only), is one component of a comprehensive MRSA colonization surveillance program. It is not intended to diagnose MRSA infection nor to guide or monitor treatment for MRSA infections. Performed at [facility], [place] [address] [place], [place] [address]       Studies: No results found.  Scheduled Meds: . aspirin  81 mg Oral Daily  . carvedilol  12.5 mg Oral  BID WC  . cetirizine  5 mg Oral QHS  . Chlorhexidine Gluconate Cloth  6 each Topical Daily  . cholecalciferol  2,000 Units Oral Daily  . cilostazol  50 mg Oral BID  . ezetimibe  10 mg Oral Daily  . famotidine  20 mg Oral Daily  . furosemide  80 mg Intravenous BID  . gabapentin  100 mg Oral TID  . heparin injection (subcutaneous)  5,000 Units Subcutaneous Q8H  . insulin aspart  0-5 Units Subcutaneous QHS  . insulin  aspart  0-9 Units Subcutaneous TID WC  . lipase/protease/amylase  72,000 Units Oral TID with meals  . mouth rinse  15 mL Mouth Rinse BID  . rosuvastatin  40 mg Oral Daily  . sodium chloride flush  3 mL Intravenous Q12H    Continuous Infusions: . sodium chloride       LOS: 6 days     Kayleen Memos, MD [facility] Pager [phone]  If 7PM-7AM, please contact night-coverage [email or web address] Password [facility] 09/06/2020, 9:07 AM

## 2020-09-07 DIAGNOSIS — R079 Chest pain, unspecified: Secondary | ICD-10-CM | POA: Diagnosis not present

## 2020-09-07 LAB — RENAL FUNCTION PANEL
Albumin: 3.1 g/dL — ABNORMAL LOW (ref 3.5–5.0)
Anion gap: 9 (ref 5–15)
BUN: 64 mg/dL — ABNORMAL HIGH (ref 8–23)
CO2: 26 mmol/L (ref 22–32)
Calcium: 8.5 mg/dL — ABNORMAL LOW (ref 8.9–10.3)
Chloride: 105 mmol/L (ref 98–111)
Creatinine, Ser: 2.45 mg/dL — ABNORMAL HIGH (ref 0.44–1.00)
GFR, Estimated: 21 mL/min — ABNORMAL LOW (ref >60)
Glucose, Bld: 143 mg/dL — ABNORMAL HIGH (ref 70–99)
Phosphorus: 4.4 mg/dL (ref 2.5–4.6)
Potassium: 3.7 mmol/L (ref 3.5–5.1)
Sodium: 140 mmol/L (ref 135–145)

## 2020-09-07 LAB — GLUCOSE, CAPILLARY
Glucose-Capillary: 137 mg/dL — ABNORMAL HIGH (ref 70–99)
Glucose-Capillary: 137 mg/dL — ABNORMAL HIGH (ref 70–99)
Glucose-Capillary: 158 mg/dL — ABNORMAL HIGH (ref 70–99)
Glucose-Capillary: 201 mg/dL — ABNORMAL HIGH (ref 70–99)

## 2020-09-07 LAB — MAGNESIUM: Magnesium: 2.2 mg/dL (ref 1.7–2.4)

## 2020-09-07 LAB — CBC
HCT: 31.5 % — ABNORMAL LOW (ref 36.0–46.0)
Hemoglobin: 9.8 g/dL — ABNORMAL LOW (ref 12.0–15.0)
MCH: 27.8 pg (ref 26.0–34.0)
MCHC: 31.1 g/dL (ref 30.0–36.0)
MCV: 89.2 fL (ref 80.0–100.0)
Platelets: 261 10*3/uL (ref 150–400)
RBC: 3.53 MIL/uL — ABNORMAL LOW (ref 3.87–5.11)
RDW: 14.6 % (ref 11.5–15.5)
WBC: 5.6 10*3/uL (ref 4.0–10.5)
nRBC: 0 % (ref 0.0–0.2)

## 2020-09-07 LAB — PHOSPHORUS: Phosphorus: 4.5 mg/dL (ref 2.5–4.6)

## 2020-09-07 NOTE — Progress Notes (Signed)
PROGRESS NOTE  Molly [name] [id number] DOB: 25-Jan-1951 DOA: 08/31/2020 PCP: [name], MD  HPI/Recap of past 24 hours: Molly [name] is a 70 y.o. female with medical history significant of DM2, HTN, CKD3b, PAD. Presenting with left-sided nonradiating chest pain associated with dyspnea and nausea.  Work-up in the ED revealed hypertensive emergency with SBP in the 200s and elevated troponin.  She was started on heparin drip and cardiology was consulted.  Seen by cardiology.  Due to worsening renal function heart cath was postponed.  She had a VQ scan that was negative for pulmonary embolism.  Hospital course complicated by worsening AKI on CKD 3B with oliguria.  Acute hypoxic hypercarbic respiratory failure secondary to pulmonary edema and possible undiagnosed OSA.  Respiratory status improved with BiPAP as needed during the day and BiPAP at night.  09/07/20: Patient was seen at her bedside.  Her husband was present.  She states she had a rough night.  Doesn't like wearing the BIPAP.  Will try to hold it off tonight and see how she does.  Assessment/Plan: Active Problems:   Hypertensive emergency   Chest pain   Elevated troponin  Chest pain rule out ACS Trop 91, 95 Seen by cardiology, AKI precluded heart cath. She received 48 hours of heparin drip, now discontinued.  Off nitroglycerin drip.   2D echo done on 08/31/2020, the left ventricle has no regional wall motion abnormality, moderate concentric LVH Aspirin 81 mg daily, Pletal 50 mg twice daily, Zetia 10 mg daily, Crestor 40 mg daily, Coreg 12.5 mg twice daily. Permissive hypertension with goal SBP between 130 and 150 per  Nephrology. Management per Cardiology.  Severe acute hypoxic hypercarbic respiratory failure likely multifactorial secondary to pulmonary edema suspected underlying undiagnosed OSA Not on oxygen supplementation at baseline Personally reviewed chest x-ray done on 09/05/2020 which shows pulmonary edema and  bilateral pleural effusions. Arterial blood gas taken on 09/05/2020 revealing pH of 7.194, PCO2 56.2, PaO2 67.5 on 3 L nasal cannula.  Repeated arterial blood gas 7.330/46.5/62.4 on 40% FiO2 on BiPAP. On IV Lasix 80 mg twice daily per nephrology. Doesn't want to wear BIPAP BIPAP prn Try off BIPAP, may need to repeat VBG later in the next 24-48 hours without use of BIPAP to assess PCO2 and PH. Patient will likely need polysomnography outpatient to r/o OSA.  Arranging for follow up with Dr. [name].  Essential hypertension, on admission hypertensive emergency. Initially treated with nitroglycerin drip.  May have contributed to AKI. She is currently on Coreg 12.5 mg twice daily and IV Lasix 80 mg twice daily. Continue to closely monitor vital signs  Cardiorenal syndrome? AKI on CKD 3B likely contributed by hypotension. Creatinine is downtrending from 3.13 to 2.72 to 2.45 Continue to closely monitor urine output and electrolytes. Presented with creatinine of 1.38. Continue to avoid nephrotoxic agents Continue daily renal panel.  Resolved post treatment: Hyperkalemia She has received Lokelma.   Continue renal diet, carbmodified as recommended by nephrology.  Daytime somnolence, fatigue suspect undiagnosed OSA. Will need to follow-up with pulmonary and have polysomnography Arranging follow-up appointment with Dr. [name]  Chronic dCHF Last 2D echo 08/31/20 LVEF 50-55% G1DD Continue strict I&O Continue Daily weight Net I&O +4.2 L Management per cardiology  Anemia of chronic disease Hg drop 9.8 from 10.4 No overt bleeding Continue to monitor H&H  Hyperlipidemia/GERD/neuropathy Continue home regimen  IBS with diarrhea Continue home regimen    Code Status: Full code  Family Communication: Updated husband at bedside 5/24  Disposition Plan: Likely will discharge to home when cardiology and nephrology sign off.   Consultants:  Cardiology.  Nephrology  Curbsided with  Pulmonary on 09/05/20.  Procedures:  2D echo  Antimicrobials:  None.  DVT prophylaxis: Subcu heparin 3 times daily  Status is: Inpatient    Dispo: The patient is from: Home               Anticipated d/c is to: Home when cardiology and nephrology sign off.               Patient currently not stable for discharge due to ongoing management of most recent chest pain, rule out ACS.   Difficult to place patient, not applicable.         Objective: Vitals:   09/07/20 0700 09/07/20 0800 09/07/20 0807 09/07/20 0835  BP: (!) 164/62 (!) 165/38    Pulse: 63 63  70  Resp: 19 (!) 22    Temp:   98 F (36.7 C)   TempSrc:   Oral   SpO2: 100% 100%    Weight:      Height:        Intake/Output Summary (Last 24 hours) at 09/07/2020 0850 Last data filed at 09/07/2020 0700 Gross per 24 hour  Intake 290 ml  Output 1000 ml  Net -710 ml   Filed Weights   08/31/20 0650 09/06/20 0500 09/07/20 0500  Weight: 72.1 kg 79.7 kg 78.5 kg    Exam:  . General: 70 y.o. year-old female.  Frail appearing in no acute distress.  Alert and oriented x 3 . Cardiovascular: RRR no rubs or gallops . Respiratory: Mild rales at bases no wheezing noted . Abdomen: Soft NT ND NBS .  Musculoskeletal: No LE edema bilaterally . Skin: No ulcerative lesions . Psychiatry: Mood is appropriate  Data Reviewed: CBC: Recent Labs  Lab 09/02/20 0257 09/04/20 0219 09/05/20 0012 09/06/20 0224 09/07/20 0248  WBC 5.8 5.6 5.7 6.4 5.6  HGB 9.5* 10.3* 10.5* 10.4* 9.8*  HCT 31.9* 34.7* 35.6* 34.6* 31.5*  MCV 91.4 90.4 91.3 90.3 89.2  PLT 203 256 290 292 0000000   Basic Metabolic Panel: Recent Labs  Lab 09/03/20 0302 09/04/20 0219 09/05/20 0012 09/06/20 0224 09/07/20 0248  NA 139 137 138 140 140  K 5.4* 5.3* 5.0 4.2 3.7  CL 106 105 107 105 105  CO2 29 20* '22 22 26  '$ GLUCOSE 199* 139* 136* 117* 143*  BUN 56* 64* 72* 74* 64*  CREATININE 3.03* 2.96* 3.13* 2.72* 2.45*  CALCIUM 7.8* 8.4* 8.4* 8.5* 8.5*  MG 2.1   --   --   --  2.2  PHOS 5.8*  --   --  5.1* 4.5  4.4   GFR: Estimated Creatinine Clearance: 21.3 mL/min (A) (by C-G formula based on SCr of 2.45 mg/dL (H)). Liver Function Tests: Recent Labs  Lab 09/03/20 0302 09/05/20 0012 09/06/20 0224 09/07/20 0248  AST 22 29  --   --   ALT 19 22  --   --   ALKPHOS 58 69  --   --   BILITOT 0.3 0.6  --   --   PROT 6.6 7.1  --   --   ALBUMIN 3.2* 3.6 3.5 3.1*   No results for input(s): LIPASE, AMYLASE in the last 168 hours. Recent Labs  Lab 09/05/20 0015  AMMONIA 14   Coagulation Profile: No results for input(s): INR, PROTIME in the last 168 hours. Cardiac Enzymes: No results for input(s):  CKTOTAL, CKMB, CKMBINDEX, TROPONINI in the last 168 hours. BNP (last 3 results) No results for input(s): PROBNP in the last 8760 hours. HbA1C: No results for input(s): HGBA1C in the last 72 hours. CBG: Recent Labs  Lab 09/06/20 0759 09/06/20 1141 09/06/20 1556 09/06/20 2148 09/07/20 0808  GLUCAP 126* 107* 187* 129* 137*   Lipid Profile: No results for input(s): CHOL, HDL, LDLCALC, TRIG, CHOLHDL, LDLDIRECT in the last 72 hours. Thyroid Function Tests: No results for input(s): TSH, T4TOTAL, FREET4, T3FREE, THYROIDAB in the last 72 hours. Anemia Panel: Recent Labs    09/04/20 1019  FERRITIN 91  TIBC 248*  IRON 38   Urine analysis:    Component Value Date/Time   COLORURINE YELLOW 06/29/2015 1636   APPEARANCEUR HAZY (A) 06/29/2015 1636   LABSPEC 1.030 06/29/2015 1636   PHURINE 5.5 06/29/2015 1636   GLUCOSEU >1000 (A) 06/29/2015 1636   HGBUR NEGATIVE 06/29/2015 1636   BILIRUBINUR NEGATIVE 06/29/2015 1636   KETONESUR NEGATIVE 06/29/2015 1636   PROTEINUR NEGATIVE 06/29/2015 1636   NITRITE NEGATIVE 06/29/2015 1636   LEUKOCYTESUR NEGATIVE 06/29/2015 1636   Sepsis Labs: '@LABRCNTIP'$ (procalcitonin:4,lacticidven:4)  ) Recent Results (from the past 240 hour(s))  SARS CORONAVIRUS 2 (TAT 6-24 HRS) Nasopharyngeal Nasopharyngeal Swab      Status: None   Collection Time: 08/31/20  9:34 AM   Specimen: Nasopharyngeal Swab  Result Value Ref Range Status   SARS Coronavirus 2 NEGATIVE NEGATIVE Final    Comment: (NOTE) SARS-CoV-2 target nucleic acids are NOT DETECTED.  The SARS-CoV-2 RNA is generally detectable in upper and lower respiratory specimens during the acute phase of infection. Negative results do not preclude SARS-CoV-2 infection, do not rule out co-infections with other pathogens, and should not be used as the sole basis for treatment or other patient management decisions. Negative results must be combined with clinical observations, patient history, and epidemiological information. The expected result is Negative.  Fact Sheet for Patients: SugarRoll.be  Fact Sheet for Healthcare Providers: https://www.woods-mathews.com/  This test is not yet approved or cleared by the [place] FDA and  has been authorized for detection and/or diagnosis of SARS-CoV-2 by FDA under an Emergency Use Authorization (EUA). This EUA will remain  in effect (meaning this test can be used) for the duration of the COVID-19 declaration under Se ction 564(b)(1) of the Act, 21 U.S.C. section 360bbb-3(b)(1), unless the authorization is terminated or revoked sooner.  Performed at [facility], [place] [address], [place], [place] [address]   MRSA PCR Screening     Status: None   Collection Time: 08/31/20  6:30 PM   Specimen: Nasopharyngeal  Result Value Ref Range Status   MRSA by PCR NEGATIVE NEGATIVE Final    Comment:        The GeneXpert MRSA Assay (FDA approved for NASAL specimens only), is one component of a comprehensive MRSA colonization surveillance program. It is not intended to diagnose MRSA infection nor to guide or monitor treatment for MRSA infections. Performed at [facility], [place] [address] [place], [place] [address]       Studies: No results  found.  Scheduled Meds: . aspirin  81 mg Oral Daily  . carvedilol  12.5 mg Oral BID WC  . cetirizine  5 mg Oral QHS  . Chlorhexidine Gluconate Cloth  6 each Topical Daily  . cholecalciferol  2,000 Units Oral Daily  . cilostazol  50 mg Oral BID  . ezetimibe  10 mg Oral Daily  . famotidine  20  mg Oral Daily  . furosemide  80 mg Intravenous BID  . gabapentin  100 mg Oral TID  . heparin injection (subcutaneous)  5,000 Units Subcutaneous Q8H  . insulin aspart  0-5 Units Subcutaneous QHS  . insulin aspart  0-9 Units Subcutaneous TID WC  . lipase/protease/amylase  72,000 Units Oral TID with meals  . mouth rinse  15 mL Mouth Rinse BID  . rosuvastatin  40 mg Oral Daily  . sodium chloride flush  3 mL Intravenous Q12H    Continuous Infusions: . sodium chloride       LOS: 7 days     Kayleen Memos, MD [facility] Pager [phone]  If 7PM-7AM, please contact night-coverage [email or web address] Password [email or web address] 09/07/2020, 8:50 AM

## 2020-09-07 NOTE — Progress Notes (Signed)
PT Cancellation Note  Patient Details Name: Molly [name] MRN: [id number] DOB: [birth year]   Cancelled Treatment:     it was late in day and pt did not feel up to any activity.  Will attempt to see in am.   Nathanial Rancher 09/07/2020, 3:53 PM

## 2020-09-07 NOTE — Progress Notes (Signed)
[facility] Progress Note   70 y.o.femalewith a past medical history DM2, HTN, CKD 3b (BL 1.1-1.4), PADwho presented w/ NSTEMI, htn, and AKI  Assessment/ Plan:   # Oliguric AKI on CKD 3b: - Likely secondary to ischemic ATN/overcorrection of blood pressure with intermittent hypotension.  Renal artery PVL shows relatively small left kidney (9cm compared to 11.2 on right) but no left RAS.  BL Cr around 1.1-1.4. - UP/C 0.68 - Continue supportive care, hold IVFs at this time and fortunately no indication for HD;  - Strict ins/outs w/ no e/o obstruction on renal US 5/21   TTE dCHF Grade I.  Renal function continues to improve which is c/w cardiorenal physiology; continue  diuresis and will need upon d/c as well.  We are going to signoff at this time; please reconsult as needed.  #Acute hypercapnic respiratory failure on BiPAP, per primary; Lasix dosing likely inadequate, increased to 80 IV twice daily  # Hypertension:  - Admitted with hypertensive emergency with hypotension intermittently.   Stable now on carvedilol- Goal for systolicblood pressure [id number] unless higher blood pressure exacerbates cardiac symptoms.   # Anemia normocytic:  - Hemoglobin 10.3  - check iron stores    # Hyperkalemia:  - l potassium 5.0, stable .  Lokelma, provides a significant sodium load  # DM2: Management per primary team  Subjective:   Feeling better. Mildly dyspneic but better with the mask off. Denies f/c/ n/v.   Objective:   BP (!) 150/107 (BP Location: Right Arm) Comment: rechecked by RN; pt moving arm  Pulse (!) 57 Comment: SB when asleep  Temp 98.1 F (36.7 C) (Oral)   Resp 14   Ht '5\' 2"'$  (1.575 m)   Wt 78.5 kg   SpO2 99%   BMI 31.65 kg/m   Intake/Output Summary (Last 24 hours) at 09/07/2020 1216 Last data filed at 09/07/2020 1158 Gross per 24 hour  Intake 530 ml  Output 2100 ml  Net -1570 ml   Weight change: -1.2 kg  Physical Exam: General  adult female in bed in no acute distress HEENT NCAT Neck supple trachea midline Lungs clear to auscultation bilaterally normal work of breathing at rest  Heart S1S2 no rub Abdomen soft nontender nondistended Extremities no edema appreciated Psych normal mood and affect Neuro alert and pleasant  Imaging: No results found.  Labs: BMET Recent Labs  Lab 09/01/20 1552 09/02/20 0257 09/03/20 0302 09/04/20 0219 09/05/20 0012 09/06/20 0224 09/07/20 0248  NA 138 135 139 137 138 140 140  K 4.3 4.9 5.4* 5.3* 5.0 4.2 3.7  CL 106 108 106 105 107 105 105  CO2 '23 24 29 '$ 20* '22 22 26  '$ GLUCOSE 149* 187* 199* 139* 136* 117* 143*  BUN 38* 46* 56* 64* 72* 74* 64*  CREATININE 1.88* 2.24* 3.03* 2.96* 3.13* 2.72* 2.45*  CALCIUM 8.2* 7.8* 7.8* 8.4* 8.4* 8.5* 8.5*  PHOS  --   --  5.8*  --   --  5.1* 4.5  4.4   CBC Recent Labs  Lab 09/04/20 0219 09/05/20 0012 09/06/20 0224 09/07/20 0248  WBC 5.6 5.7 6.4 5.6  HGB 10.3* 10.5* 10.4* 9.8*  HCT 34.7* 35.6* 34.6* 31.5*  MCV 90.4 91.3 90.3 89.2  PLT 256 290 292 261    Medications:    . aspirin  81 mg Oral Daily  . carvedilol  12.5 mg Oral BID WC  . cetirizine  5 mg Oral QHS  . Chlorhexidine Gluconate Cloth  6 each Topical  Daily  . cholecalciferol  2,000 Units Oral Daily  . cilostazol  50 mg Oral BID  . ezetimibe  10 mg Oral Daily  . famotidine  20 mg Oral Daily  . furosemide  80 mg Intravenous BID  . gabapentin  100 mg Oral TID  . heparin injection (subcutaneous)  5,000 Units Subcutaneous Q8H  . insulin aspart  0-5 Units Subcutaneous QHS  . insulin aspart  0-9 Units Subcutaneous TID WC  . lipase/protease/amylase  72,000 Units Oral TID with meals  . mouth rinse  15 mL Mouth Rinse BID  . rosuvastatin  40 mg Oral Daily  . sodium chloride flush  3 mL Intravenous Q12H      Otelia Santee, MD 09/07/2020, 12:16 PM

## 2020-09-07 NOTE — TOC Progression Note (Addendum)
Transition of Care [facility]) - Progression Note    Patient Details  Name: Molly [name] MRN: [id number] Date of Birth: 1950/08/22  [facility] CM/SW Contact  [name], RN Phone Number: 09/07/2020, 9:54 AM  Clinical Narrative:    70 y.o.femalewith medical history significant ofDM2, HTN, CKD3b, PAD. Presenting with left-sided nonradiating chest pain associated with dyspnea and nausea.  Work-up in the ED revealed hypertensive emergency with SBP in the 200s and elevated troponin.  She was started on heparin drip and cardiology was consulted.  Seen by cardiology.  Due to worsening renal function heart cath was postponed.  She had a VQ scan that was negative for pulmonary embolism.  Hospital course complicated by worsening AKI on CKD 3B with oliguria.  Acute hypoxic hypercarbic respiratory failure secondary to pulmonary edema and possible undiagnosed OSA.  Respiratory status improved with BiPAP as needed during the day and BiPAP at night.  09/07/20: Patient was seen at her bedside.  Her husband was present.  She states she had a rough night.  Doesn't like wearing the BIPAP.  Will try to hold it off tonight and see how she does. plan: to return to home with self care, may need home o2 will follow for. Rolling walker ordered through [employer] at 1322.  Expected Discharge Plan: Home/Self Care Barriers to Discharge: No Barriers Identified  Expected Discharge Plan and Services Expected Discharge Plan: Home/Self Care   Discharge Planning Services: CM Consult   Living arrangements for the past 2 months: Single Family Home                                       Social Determinants of Health (SDOH) Interventions    Readmission Risk Interventions No flowsheet data found.

## 2020-09-08 DIAGNOSIS — R079 Chest pain, unspecified: Secondary | ICD-10-CM | POA: Diagnosis not present

## 2020-09-08 DIAGNOSIS — I169 Hypertensive crisis, unspecified: Secondary | ICD-10-CM | POA: Diagnosis not present

## 2020-09-08 LAB — RENAL FUNCTION PANEL
Albumin: 3.3 g/dL — ABNORMAL LOW (ref 3.5–5.0)
Anion gap: 9 (ref 5–15)
BUN: 65 mg/dL — ABNORMAL HIGH (ref 8–23)
CO2: 32 mmol/L (ref 22–32)
Calcium: 8.6 mg/dL — ABNORMAL LOW (ref 8.9–10.3)
Chloride: 100 mmol/L (ref 98–111)
Creatinine, Ser: 2.4 mg/dL — ABNORMAL HIGH (ref 0.44–1.00)
GFR, Estimated: 21 mL/min — ABNORMAL LOW (ref >60)
Glucose, Bld: 178 mg/dL — ABNORMAL HIGH (ref 70–99)
Phosphorus: 4.1 mg/dL (ref 2.5–4.6)
Potassium: 3.5 mmol/L (ref 3.5–5.1)
Sodium: 141 mmol/L (ref 135–145)

## 2020-09-08 LAB — CBC
HCT: 32.9 % — ABNORMAL LOW (ref 36.0–46.0)
Hemoglobin: 10 g/dL — ABNORMAL LOW (ref 12.0–15.0)
MCH: 27.4 pg (ref 26.0–34.0)
MCHC: 30.4 g/dL (ref 30.0–36.0)
MCV: 90.1 fL (ref 80.0–100.0)
Platelets: 263 10*3/uL (ref 150–400)
RBC: 3.65 MIL/uL — ABNORMAL LOW (ref 3.87–5.11)
RDW: 14.6 % (ref 11.5–15.5)
WBC: 6.2 10*3/uL (ref 4.0–10.5)
nRBC: 0 % (ref 0.0–0.2)

## 2020-09-08 LAB — GLUCOSE, CAPILLARY
Glucose-Capillary: 151 mg/dL — ABNORMAL HIGH (ref 70–99)
Glucose-Capillary: 102 mg/dL — ABNORMAL HIGH (ref 70–99)
Glucose-Capillary: 166 mg/dL — ABNORMAL HIGH (ref 70–99)
Glucose-Capillary: 204 mg/dL — ABNORMAL HIGH (ref 70–99)

## 2020-09-08 LAB — BLOOD GAS, VENOUS
Acid-Base Excess: 8.3 mmol/L — ABNORMAL HIGH (ref 0.0–2.0)
Bicarbonate: 34.1 mmol/L — ABNORMAL HIGH (ref 20.0–28.0)
O2 Saturation: 85.3 %
Patient temperature: 98.6
pCO2, Ven: 56 mmHg (ref 44.0–60.0)
pH, Ven: 7.402 (ref 7.250–7.430)
pO2, Ven: 49.8 mmHg — ABNORMAL HIGH (ref 32.0–45.0)

## 2020-09-08 LAB — MAGNESIUM: Magnesium: 2.1 mg/dL (ref 1.7–2.4)

## 2020-09-08 NOTE — Progress Notes (Signed)
Physical Therapy Treatment Patient Details Name: Molly [name] MRN: [id number] DOB: December 15, 1950 Today's Date: 09/08/2020    History of Present Illness 70 y.o. female presented with left-sided nonradiating chest pain associated with dyspnea and nausea.  Work-up in the ED revealed hypertensive emergency with SBP in the 200s and elevated troponin. Dx of possible NSTEMI, AKI. Pt with medical history significant of DM2, HTN, CKD3b, PAD.    PT Comments    Pt seen in step down ICU.  In bed on 2 lts nasal at 97% some what sleepy/groggy but easily aroused.  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  Used walker for balance stability.  Max c/o weakness esp B LE.  SATURATION QUALIFICATIONS: (This note is used to comply with regulatory documentation for home oxygen)  Patient Saturations on Room Air at Rest = 91%  Patient Saturations on Room Air while Ambulating 18 feet = 83%  Patient Saturations on 2 Liters of oxygen while Ambulating = 90%  Please briefly explain why patient needs home oxygen:  Pt required supplemental oxygen to achieve therapeutic level   Follow Up Recommendations  Home health PT     Equipment Recommendations  Rolling walker with 5" wheels (youth (pt 5'2"))    Recommendations for Other Services       Precautions / Restrictions Precautions Precaution Comments: monitor O2    Mobility  Bed Mobility Overal bed mobility: Needs Assistance Bed Mobility: Supine to Sit     Supine to sit: Supervision;Min guard     General bed mobility comments: required increased time, use of rail and slight assist with bed pad to complete scooting to  EOB    Transfers Overall transfer level: Needs assistance Equipment used: Rolling walker (2 wheeled) Transfers: Sit to/from Stand Sit to Stand: Min guard;Supervision         General transfer comment: one VC on proper hand placement with sit to stand to avoid pulling up on walker  Ambulation/Gait Ambulation/Gait assistance: Min guard Gait Distance (Feet): 18 Feet Assistive device: Rolling walker (2 wheeled) Gait Pattern/deviations: Step-through pattern;Decreased stride length Gait velocity: decr   General Gait Details: Trial RA amb decreased from 94% to 83% with 2/4 dyspnea.  Used walker for balance stability.  Max c/o weakness esp B LE.   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)      Exercises      General Comments  Pertinent Vitals/Pain Pain Assessment: No/denies pain    Home Living                      Prior Function            PT Goals (current goals can now be found in the care plan section) Progress towards PT goals: Progressing toward goals    Frequency    Min 3X/week      PT Plan Current plan remains appropriate    Co-evaluation              AM-PAC PT "6 Clicks" Mobility   Outcome Measure  Help needed turning from your back to your side while in a flat bed without using bedrails?: A Little Help needed moving from lying on your back to sitting on the side of a flat bed without using bedrails?: A Little Help needed moving to and from a bed to a chair (including a wheelchair)?: A Little Help needed standing up  from a chair using your arms (e.g., wheelchair or bedside chair)?: A Little Help needed to walk in hospital room?: A Lot Help needed climbing 3-5 steps with a railing? : A Lot 6 Click Score: 16    End of Session Equipment Utilized During Treatment: Gait belt Activity Tolerance: Patient limited by fatigue Patient left: in chair;with call bell/phone within reach;with chair alarm set Nurse Communication: Mobility status PT Visit Diagnosis: Difficulty in walking, not elsewhere classified (R26.2)     Time: 1040-1110 PT Time Calculation (min) (ACUTE ONLY): 30 min  Charges:  $Gait Training: 8-22 mins $Therapeutic Activity: 8-22 mins                     Rica Koyanagi  PTA Acute  Rehabilitation Services Pager      [phone] Office      [phone]

## 2020-09-08 NOTE — Progress Notes (Addendum)
PROGRESS NOTE  Molly [name] [id number] DOB: [birth year] DOA: 08/31/2020 PCP: [name], MD  HPI/Recap of past 24 hours: Molly [name] is a 70 y.o. female with medical history significant of DM2, HTN, CKD3b, PAD. Presenting with left-sided nonradiating chest pain associated with dyspnea and nausea.  Work-up in the ED revealed hypertensive emergency with SBP in the 200s and elevated troponin.  She was started on heparin drip and cardiology was consulted.  Seen by cardiology.  Due to worsening renal function heart cath was postponed.  She had a VQ scan that was negative for pulmonary embolism.  Hospital course complicated by worsening AKI on CKD 3B with oliguria.  Acute hypoxic hypercarbic respiratory failure secondary to pulmonary edema and possible undiagnosed OSA.  Respiratory status improved with BiPAP as needed during the day and BiPAP at night.  09/08/20: Patient was seen and examined at bedside.  She states that she feels better today.  She is sitting up in a chair.  Conversant and somnolent.  Ongoing diuresing with IV Lasix 80 mg twice daily.  Assessment/Plan: Active Problems:   Hypertensive emergency   Chest pain   Elevated troponin  Chest pain rule out ACS Trop 91, 95 Seen by cardiology, AKI precluded heart cath. She received 48 hours of heparin drip, now discontinued.  Off nitroglycerin drip.   2D echo done on 08/31/2020, the left ventricle has no regional wall motion abnormality, moderate concentric LVH Aspirin 81 mg daily, Pletal 50 mg twice daily, Zetia 10 mg daily, Crestor 40 mg daily, Coreg 12.5 mg twice daily. Permissive hypertension with goal SBP between 130 and 150 per  Nephrology. Management per Cardiology.  Severe acute hypoxic hypercarbic respiratory failure likely multifactorial secondary to pulmonary edema suspected underlying undiagnosed OSA Not on oxygen supplementation at baseline Personally reviewed chest x-ray done on 09/05/2020 which shows  pulmonary edema and bilateral pleural effusions. Arterial blood gas taken on 09/05/2020 revealing pH of 7.194, PCO2 56.2, PaO2 67.5 on 3 L nasal cannula.  Repeated arterial blood gas 7.330/46.5/62.4 on 40% FiO2 on BiPAP. On IV Lasix 80 mg twice daily per nephrology. Doesn't want to wear BIPAP BIPAP prn Try off BIPAP, may need to repeat VBG later in the next 24-48 hours without use of BIPAP to assess PCO2 and PH. Patient will likely need polysomnography outpatient to r/o OSA.  Arranging for follow up with Dr. [name].  Essential hypertension, on admission hypertensive emergency. Initially treated with nitroglycerin drip.  May have contributed to AKI. She is currently on Coreg 12.5 mg twice daily and IV Lasix 80 mg twice daily. Continue to closely monitor vital signs  Cardiorenal syndrome/ AKI on CKD 3B likely contributed by hypotension. Creatinine is downtrending from 3.13 to 2.72 to 2.45 to 2.40 Continue IV diuretics.  She is on IV Lasix 80 mg twice daily.  We will switch to p.o. once her creatinine plateaus. Presented with creatinine of 1.38. Continue to avoid nephrotoxic agents Continue daily renal panel.  Type 2 diabetes with hyperglycemia As hemoglobin A1c 7.0 on 08/31/2020 Continue insulin sliding scale  Resolved post treatment: Hyperkalemia She has received Lokelma.   Continue renal diet, carbmodified as recommended by nephrology.  Daytime somnolence, suspect undiagnosed OSA. Will need to follow-up with pulmonary and have polysomnography Arranging follow-up appointment with Dr. [name] Obtain VBG on 09/08/2020 at 1500 p.m.  Chronic dCHF Last 2D echo 08/31/20 LVEF 50-55% G1DD Continue strict I&O Continue Daily weight Net I&O +4.2 L Management per cardiology  Anemia of chronic disease Hg  drop 9.8 from 10.4 No overt bleeding Continue to monitor H&H  Hyperlipidemia/GERD/neuropathy Continue home regimen  IBS with diarrhea Continue home regimen    Code Status: Full  code  Family Communication: Updated husband at bedside 5/24  Disposition Plan: Likely will discharge to home when cardiology and nephrology sign off.   Consultants:  Cardiology.  Nephrology  Curbsided with Pulmonary on 09/05/20.  Procedures:  2D echo  Antimicrobials:  None.  DVT prophylaxis: Subcu heparin 3 times daily  Status is: Inpatient    Dispo: The patient is from: Home               Anticipated d/c is to: Home when cardiology and nephrology sign off.               Patient currently not stable for discharge due to ongoing management of most recent chest pain, rule out ACS.   Difficult to place patient, not applicable.         Objective: Vitals:   09/08/20 0900 09/08/20 1200 09/08/20 1210 09/08/20 1400  BP: (!) 161/87 (!) 157/81 (!) 157/81   Pulse: 68 64 63   Resp: 16 18 (!) 22   Temp:    98.7 F (37.1 C)  TempSrc:    Oral  SpO2: 90% 100% 97%   Weight:      Height:        Intake/Output Summary (Last 24 hours) at 09/08/2020 1443 Last data filed at 09/08/2020 1000 Gross per 24 hour  Intake 480 ml  Output 1600 ml  Net -1120 ml   Filed Weights   09/06/20 0500 09/07/20 0500 09/08/20 0500  Weight: 79.7 kg 78.5 kg 78.3 kg    Exam:  . General: 70 y.o. year-old female.  Frail-appearing no acute distress.  She is oriented x3.  .  Cardiovascular: Regular rate and rhythm no rubs or gallops. Marland Kitchen Respiratory: Mild rales left lower base.  No wheezing noted.  Good inspiratory effort.   . Abdomen: Soft nontender normal bowel sounds present. .  Musculoskeletal: No lower extremity edema bilaterally. . Skin: No ulcerative lesions noted. Marland Kitchen Psychiatry: Mood is appropriate for condition and setting.  Data Reviewed: CBC: Recent Labs  Lab 09/04/20 0219 09/05/20 0012 09/06/20 0224 09/07/20 0248 09/08/20 0251  WBC 5.6 5.7 6.4 5.6 6.2  HGB 10.3* 10.5* 10.4* 9.8* 10.0*  HCT 34.7* 35.6* 34.6* 31.5* 32.9*  MCV 90.4 91.3 90.3 89.2 90.1  PLT 256 290 292 261  [id number]   Basic Metabolic Panel: Recent Labs  Lab 09/03/20 0302 09/04/20 0219 09/05/20 0012 09/06/20 0224 09/07/20 0248 09/08/20 0251  NA 139 137 138 140 140 141  K 5.4* 5.3* 5.0 4.2 3.7 3.5  CL 106 105 107 105 105 100  CO2 29 20* '22 22 26 '$ 32  GLUCOSE 199* 139* 136* 117* 143* 178*  BUN 56* 64* 72* 74* 64* 65*  CREATININE 3.03* 2.96* 3.13* 2.72* 2.45* 2.40*  CALCIUM 7.8* 8.4* 8.4* 8.5* 8.5* 8.6*  MG 2.1  --   --   --  2.2 2.1  PHOS 5.8*  --   --  5.1* 4.5  4.4 4.1   GFR: Estimated Creatinine Clearance: 21.7 mL/min (A) (by C-G formula based on SCr of 2.4 mg/dL (H)). Liver Function Tests: Recent Labs  Lab 09/03/20 0302 09/05/20 0012 09/06/20 0224 09/07/20 0248 09/08/20 0251  AST 22 29  --   --   --   ALT 19 22  --   --   --  ALKPHOS 58 69  --   --   --   BILITOT 0.3 0.6  --   --   --   PROT 6.6 7.1  --   --   --   ALBUMIN 3.2* 3.6 3.5 3.1* 3.3*   No results for input(s): LIPASE, AMYLASE in the last 168 hours. Recent Labs  Lab 09/05/20 0015  AMMONIA 14   Coagulation Profile: No results for input(s): INR, PROTIME in the last 168 hours. Cardiac Enzymes: No results for input(s): CKTOTAL, CKMB, CKMBINDEX, TROPONINI in the last 168 hours. BNP (last 3 results) No results for input(s): PROBNP in the last 8760 hours. HbA1C: No results for input(s): HGBA1C in the last 72 hours. CBG: Recent Labs  Lab 09/07/20 1111 09/07/20 1605 09/07/20 2042 09/08/20 0803 09/08/20 1226  GLUCAP 137* 158* 201* 151* 102*   Lipid Profile: No results for input(s): CHOL, HDL, LDLCALC, TRIG, CHOLHDL, LDLDIRECT in the last 72 hours. Thyroid Function Tests: No results for input(s): TSH, T4TOTAL, FREET4, T3FREE, THYROIDAB in the last 72 hours. Anemia Panel: No results for input(s): VITAMINB12, FOLATE, FERRITIN, TIBC, IRON, RETICCTPCT in the last 72 hours. Urine analysis:    Component Value Date/Time   COLORURINE YELLOW 06/29/2015 1636   APPEARANCEUR HAZY (A) 06/29/2015 1636   LABSPEC  1.030 06/29/2015 1636   PHURINE 5.5 06/29/2015 1636   GLUCOSEU >1000 (A) 06/29/2015 1636   HGBUR NEGATIVE 06/29/2015 1636   BILIRUBINUR NEGATIVE 06/29/2015 1636   KETONESUR NEGATIVE 06/29/2015 1636   PROTEINUR NEGATIVE 06/29/2015 1636   NITRITE NEGATIVE 06/29/2015 1636   LEUKOCYTESUR NEGATIVE 06/29/2015 1636   Sepsis Labs: '@LABRCNTIP'$ (procalcitonin:4,lacticidven:4)  ) Recent Results (from the past 240 hour(s))  SARS CORONAVIRUS 2 (TAT 6-24 HRS) Nasopharyngeal Nasopharyngeal Swab     Status: None   Collection Time: 08/31/20  9:34 AM   Specimen: Nasopharyngeal Swab  Result Value Ref Range Status   SARS Coronavirus 2 NEGATIVE NEGATIVE Final    Comment: (NOTE) SARS-CoV-2 target nucleic acids are NOT DETECTED.  The SARS-CoV-2 RNA is generally detectable in upper and lower respiratory specimens during the acute phase of infection. Negative results do not preclude SARS-CoV-2 infection, do not rule out co-infections with other pathogens, and should not be used as the sole basis for treatment or other patient management decisions. Negative results must be combined with clinical observations, patient history, and epidemiological information. The expected result is Negative.  Fact Sheet for Patients: SugarRoll.be  Fact Sheet for Healthcare Providers: https://www.woods-mathews.com/  This test is not yet approved or cleared by the [place] FDA and  has been authorized for detection and/or diagnosis of SARS-CoV-2 by FDA under an Emergency Use Authorization (EUA). This EUA will remain  in effect (meaning this test can be used) for the duration of the COVID-19 declaration under Se ction 564(b)(1) of the Act, 21 U.S.C. section 360bbb-3(b)(1), unless the authorization is terminated or revoked sooner.  Performed at [facility], [place] [address], [place], [place] [address]   MRSA PCR Screening     Status: None   Collection Time: 08/31/20   6:30 PM   Specimen: Nasopharyngeal  Result Value Ref Range Status   MRSA by PCR NEGATIVE NEGATIVE Final    Comment:        The GeneXpert MRSA Assay (FDA approved for NASAL specimens only), is one component of a comprehensive MRSA colonization surveillance program. It is not intended to diagnose MRSA infection nor to guide or monitor treatment for MRSA infections. Performed at [facility], [place] [address] [place], [place] [address]       Studies: No results found.  Scheduled Meds: . aspirin  81 mg Oral Daily  . carvedilol  12.5 mg Oral BID WC  . cetirizine  5 mg Oral QHS  . Chlorhexidine Gluconate Cloth  6 each Topical Daily  . cholecalciferol  2,000 Units Oral Daily  . cilostazol  50 mg Oral BID  . ezetimibe  10 mg Oral Daily  . famotidine  20 mg Oral Daily  . furosemide  80 mg Intravenous BID  . gabapentin  100 mg Oral TID  . heparin injection (subcutaneous)  5,000 Units Subcutaneous Q8H  . insulin aspart  0-5 Units Subcutaneous QHS  . insulin aspart  0-9 Units Subcutaneous TID WC  . lipase/protease/amylase  72,000 Units Oral TID with meals  . mouth rinse  15 mL Mouth Rinse BID  . rosuvastatin  40 mg Oral Daily  . sodium chloride flush  3 mL Intravenous Q12H    Continuous Infusions: . sodium chloride       LOS: 8 days     Kayleen Memos, MD [facility] Pager [phone]  If 7PM-7AM, please contact night-coverage [email or web address] Password [facility] 09/08/2020, 2:43 PM

## 2020-09-08 NOTE — Plan of Care (Signed)
  Problem: Education: Goal: Knowledge of General Education information will improve Description: Including pain rating scale, medication(s)/side effects and non-pharmacologic comfort measures Outcome: Progressing   Problem: Coping: Goal: Level of anxiety will decrease Outcome: Progressing   Problem: Pain Managment: Goal: General experience of comfort will improve Outcome: Progressing   

## 2020-09-08 NOTE — Progress Notes (Signed)
Progress Note  Patient Name: Molly [name] Date of Encounter: 09/08/2020  [facility] Cardiologist: Jenne Campus, MD   Subjective   Net -1.6 L yesterday, creatinine improving (3.1 > 2.7 > 2.5 > 2.4).  BP 161/87 this morning.  Denies any chest pain or dyspnea.  Inpatient Medications    Scheduled Meds: . aspirin  81 mg Oral Daily  . carvedilol  12.5 mg Oral BID WC  . cetirizine  5 mg Oral QHS  . Chlorhexidine Gluconate Cloth  6 each Topical Daily  . cholecalciferol  2,000 Units Oral Daily  . cilostazol  50 mg Oral BID  . ezetimibe  10 mg Oral Daily  . famotidine  20 mg Oral Daily  . furosemide  80 mg Intravenous BID  . gabapentin  100 mg Oral TID  . heparin injection (subcutaneous)  5,000 Units Subcutaneous Q8H  . insulin aspart  0-5 Units Subcutaneous QHS  . insulin aspart  0-9 Units Subcutaneous TID WC  . lipase/protease/amylase  72,000 Units Oral TID with meals  . mouth rinse  15 mL Mouth Rinse BID  . rosuvastatin  40 mg Oral Daily  . sodium chloride flush  3 mL Intravenous Q12H   Continuous Infusions: . sodium chloride     PRN Meds: sodium chloride, acetaminophen, ALPRAZolam, diphenhydrAMINE, hydrALAZINE, ipratropium-albuterol, loperamide, nitroGLYCERIN, ondansetron (ZOFRAN) IV, sodium chloride flush   Vital Signs    Vitals:   09/08/20 0800 09/08/20 0821 09/08/20 0834 09/08/20 0900  BP: (!) 179/48 (!) 175/59  (!) 161/87  Pulse: 70   68  Resp: 18   16  Temp:   98.6 F (37 C)   TempSrc:   Oral   SpO2: 91%   90%  Weight:      Height:        Intake/Output Summary (Last 24 hours) at 09/08/2020 1133 Last data filed at 09/08/2020 1000 Gross per 24 hour  Intake 720 ml  Output 2100 ml  Net -1380 ml   Last 3 Weights 09/08/2020 09/07/2020 09/06/2020  Weight (lbs) 172 lb 9.9 oz 173 lb 1 oz 175 lb 11.3 oz  Weight (kg) 78.3 kg 78.5 kg 79.7 kg      Telemetry    No new tracings - Personally Reviewed  ECG    Sinus rhythm with occasional PACs/PVCs -  Personally Reviewed  Physical Exam   GEN: No acute distress.   Neck: +JVD Cardiac: RRR, no murmurs, rubs, or gallops.  Respiratory:  CTAB GI: Soft, nontender, non-distended  MS:  Now LE edema; No deformity. Neuro:  Nonfocal  Psych: Normal affect   Labs    High Sensitivity Troponin:   Recent Labs  Lab 08/31/20 0720 08/31/20 0934  TROPONINIHS 91* 95*      Chemistry Recent Labs  Lab 09/03/20 0302 09/04/20 0219 09/05/20 0012 09/06/20 0224 09/07/20 0248 09/08/20 0251  NA 139   < > 138 140 140 141  K 5.4*   < > 5.0 4.2 3.7 3.5  CL 106   < > 107 105 105 100  CO2 29   < > '22 22 26 '$ 32  GLUCOSE 199*   < > 136* 117* 143* 178*  BUN 56*   < > 72* 74* 64* 65*  CREATININE 3.03*   < > 3.13* 2.72* 2.45* 2.40*  CALCIUM 7.8*   < > 8.4* 8.5* 8.5* 8.6*  PROT 6.6  --  7.1  --   --   --   ALBUMIN 3.2*  --  3.6  3.5 3.1* 3.3*  AST 22  --  29  --   --   --   ALT 19  --  22  --   --   --   ALKPHOS 58  --  69  --   --   --   BILITOT 0.3  --  0.6  --   --   --   GFRNONAA 16*   < > 16* 18* 21* 21*  ANIONGAP 4*   < > '9 13 9 9   '$ < > = values in this interval not displayed.     Hematology Recent Labs  Lab 09/06/20 0224 09/07/20 0248 09/08/20 0251  WBC 6.4 5.6 6.2  RBC 3.83* 3.53* 3.65*  HGB 10.4* 9.8* 10.0*  HCT 34.6* 31.5* 32.9*  MCV 90.3 89.2 90.1  MCH 27.2 27.8 27.4  MCHC 30.1 31.1 30.4  RDW 14.6 14.6 14.6  PLT 292 261 263    BNP Recent Labs  Lab 09/05/20 0619  BNP 1,047.3*     DDimer  No results for input(s): DDIMER in the last 168 hours.   Radiology    No results found.  Cardiac Studies   Echocardiogram 08/31/20: 1. Left ventricular ejection fraction, by estimation, is 50 to 55%. Left  ventricular ejection fraction by 3D volume is 51 %. The left ventricle has  low normal function. The left ventricle has no regional wall motion  abnormalities. There is moderate  concentric left ventricular hypertrophy. Left ventricular diastolic  parameters are consistent  with Grade I diastolic dysfunction (impaired  relaxation).  2. Right ventricular systolic function is normal. The right ventricular  size is normal.  3. The pericardial effusion is circumferential. There is no evidence of  cardiac tamponade.  4. The mitral valve is grossly normal. Trivial mitral valve  regurgitation. No evidence of mitral stenosis.  5. The aortic valve is tricuspid. There is mild calcification of the  aortic valve. There is mild thickening of the aortic valve. Aortic valve  regurgitation is not visualized. No aortic stenosis is present.  6. The inferior vena cava is normal in size with greater than 50%  respiratory variability, suggesting right atrial pressure of 3 mmHg.   Comparison(s): A prior study was performed on 11/11/2019. No significant  change from prior study. Prior images reviewed side by side.   Renal artery [place] 09/03/20: Right: Upper range 1-59% stenosis of the right renal artery by     velocities of visualized segments. RRV flow present.  Left: No evidence of hemodynamically significant left renal artery     stenosis. LRV flow present. Abnormal size for the left     kidney.   Abdominal aortogram 08/28/2019 -90% proximal left renal artery stenosis -70% proximal left SFA, 100% mid left SFA -100% left popliteal occlusion -100% mid right SFA   Patient Profile   70 y.o. female with a PMH of PAD (CTO of left SFA and popliteal artery 08/2019), Left renal artery stenosis, HTN, HLD, DM type 2, and CKD stage 3, who is being followed by cardiology for chest pain and elevated troponins.   Assessment & Plan    1. Chest pain and elevated troponin in patient with coronary artery calcifications: patient presented with chest pain which woke her from sleep 08/31/20. BP was markedly elevated on arrival. EKG showed no ischemic changes. HsTrop was elevated to 91>95 - trend not c/w ACS. BNP was elevated to 421. CXR suggested possible PNA, with CT  suggesting atelectasis vs PNA. She remained  afebrile, no leukocytosis, and procal was negative suggesting no acute infections. She was started on a nitro gtt with resolution of chest pain, now off. Echo showed EF 50-55%, no RWMA, moderate concentric LVH, trivial pericardial effusion, and no significant valvular abnormalities. It was felt that her chest pain was likely demand ischemia in the setting of hypertensive crisis, though suspicions are quite high for CAD given extensive coronary artery calcifications on CT, as well as history of PAD, poorly controlled HTN, HLD, and DM type 2. Initially planned to undergo LHC, however with decline in kidney function, cath has been delayed indefinitely. She received 48 hr IV heparin, now on aspirin, statin, and BBlocker.   - Continue aspirin and statin - Continue BBlocker - Could consider outpatient NST to r/o ischemia.  2. Hypertensive crisis: BP markedly elevated to 200s/90s on admission. Likely contributed to #1. She has known left renal artery stenosis, though this was not felt to have contributed to her possible flash pulmonary edema. Repeat renal dopplers this admission without left RAS but showed 1-59% right RAS. BP has been quite labile. She was transitioned form home regimen of amlodipine, metoprolol, and losartan to carvedilol, though with NPO status, has not received po antihypertensives, though has been given 2 doses of IV hydralazine '5mg'$   - Continue carvedilol 12.'5mg'$  BID as tolerated - goal SBP 130-150s per nephrology  3. Acute hypercarbic/hypoxic respiratory failure: worsening O2 status yesterday, stable on BiPAP. ABG with acidosis and CO2 56. Lactate wnl. IVF's stopped and she was started on IV lasix '80mg'$  BID per nephrology. I&Os incomplete with UOP not documented. Weight is 175lbs today, up from 159lbs on admission. Still with faint crackles at lung bases on exam today - Continue diuresis per nephrology.  - Continue to monitor strict I&Os and daily  weights - Continue to monitor electrolytes closely and replete as needed to maintain K >4, Mg >2  4. PAD: no claudication complaints. - continue aspirin and statin  5. HLD: LDL 107 this admission on crestor '40mg'$  daily; goal >70 - Continue crestor - May benefit from referral to lipid clinic as it is unlikely she will reach goal with addition of zetia.   6. DM type 2: A1C 7.0 this admission  - Continue ISS per primary team - Could consider addition of SGLT-2 inhibitor.   7. Acute on chronic renal insufficiency: Cr peaked at 3.13, down to 2.72 today, though up from baseline 1.3. Nephrology following. Felt to be 2/2 ischemic ATN/overcorrection of BP with intermittent hypotension. Renal artery [place] with mild-moderate right RAS with small left kidney, felt to be non-contributory to her AKI. Received IVF's, though now being diuresed after developing acute respiratory failure and pulmonary edema.  - Appreciate nephrology assistance with diuresis.  - Continue to monitor renal function closely    For questions or updates, please contact [place] Please consult [email or web address] for contact info under       Signed, Donato Heinz, MD  09/08/2020, 11:33 AM     Patient seen and examined.  Agree with above documentation.  On exam, patient is alert and oriented, regular rate and rhythm, no murmurs, lungs CTAB, no LE edema.  Telemetry shows NSR with rate 60-70s. Renal function improving with diuresis, continue per nephrology.  Plan likely outpatient nuclear stress test once recovers.  Donato Heinz, MD

## 2020-09-09 DIAGNOSIS — N179 Acute kidney failure, unspecified: Secondary | ICD-10-CM | POA: Diagnosis not present

## 2020-09-09 DIAGNOSIS — I161 Hypertensive emergency: Secondary | ICD-10-CM | POA: Diagnosis not present

## 2020-09-09 DIAGNOSIS — R079 Chest pain, unspecified: Secondary | ICD-10-CM | POA: Diagnosis not present

## 2020-09-09 LAB — GLUCOSE, CAPILLARY
Glucose-Capillary: 189 mg/dL — ABNORMAL HIGH (ref 70–99)
Glucose-Capillary: 157 mg/dL — ABNORMAL HIGH (ref 70–99)
Glucose-Capillary: 220 mg/dL — ABNORMAL HIGH (ref 70–99)
Glucose-Capillary: 246 mg/dL — ABNORMAL HIGH (ref 70–99)

## 2020-09-09 LAB — BASIC METABOLIC PANEL
Anion gap: 11 (ref 5–15)
BUN: 61 mg/dL — ABNORMAL HIGH (ref 8–23)
CO2: 35 mmol/L — ABNORMAL HIGH (ref 22–32)
Calcium: 8.4 mg/dL — ABNORMAL LOW (ref 8.9–10.3)
Chloride: 96 mmol/L — ABNORMAL LOW (ref 98–111)
Creatinine, Ser: 2.35 mg/dL — ABNORMAL HIGH (ref 0.44–1.00)
GFR, Estimated: 22 mL/min — ABNORMAL LOW (ref >60)
Glucose, Bld: 204 mg/dL — ABNORMAL HIGH (ref 70–99)
Potassium: 3.3 mmol/L — ABNORMAL LOW (ref 3.5–5.1)
Sodium: 142 mmol/L (ref 135–145)

## 2020-09-09 MED ORDER — POTASSIUM CHLORIDE CRYS ER 20 MEQ PO TBCR
40.0000 meq | EXTENDED_RELEASE_TABLET | Freq: Once | ORAL | Status: AC
  Administered 2020-09-09: 40 meq via ORAL
  Filled 2020-09-09: qty 2

## 2020-09-09 MED ORDER — CARVEDILOL 25 MG PO TABS
25.0000 mg | ORAL_TABLET | Freq: Two times a day (BID) | ORAL | Status: DC
  Administered 2020-09-09 – 2020-09-13 (×7): 25 mg via ORAL
  Filled 2020-09-09 (×8): qty 1

## 2020-09-09 NOTE — Progress Notes (Addendum)
PROGRESS NOTE  Molly [name] [id number] DOB: 1951/04/15 DOA: 08/31/2020 PCP: [name], MD  HPI/Recap of past 24 hours: Molly [name] is a 70 y.o. female with medical history significant of DM2, HTN, CKD3b, PAD. Presenting with left-sided nonradiating chest pain associated with dyspnea and nausea.  Work-up in the ED revealed hypertensive emergency with SBP in the 200s and elevated troponin.  She was started on heparin drip and cardiology was consulted.  Seen by cardiology.  Due to worsening renal function heart cath was postponed.  She had a VQ scan that was negative for pulmonary embolism.  Hospital course complicated by worsening AKI on CKD 3B with oliguria.  Acute hypoxic hypercarbic respiratory failure secondary to pulmonary edema and possible undiagnosed OSA.  Respiratory status improved with BiPAP as needed during the day and BiPAP at night.  IV diuresing started on 09/05/2020 in the setting of cardiorenal syndrome with improvement of renal function and respiratory status.  09/09/20: Patient was seen and examined at bedside.  She wore BiPAP overnight.  She feels better this morning.  She is more alert and interactive.  Repeated venous blood gas PCO2 after 1 night without BiPAP was elevated at 56.  Assessment/Plan: Active Problems:   Hypertensive emergency   Chest pain   Elevated troponin  Chest pain rule out ACS Trop 91, 95 Seen by cardiology, AKI precluded heart cath. She received 48 hours of heparin drip, now discontinued.  Off nitroglycerin drip.   2D echo done on 08/31/2020, the left ventricle has no regional wall motion abnormality, moderate concentric LVH Aspirin 81 mg daily, Pletal 50 mg twice daily, Zetia 10 mg daily, Crestor 40 mg daily, Coreg 12.5 mg twice daily. IV Lasix 80 mg twice daily added on 09/05/2020. Permissive hypertension with goal SBP between 130 and 150 per  Nephrology. Management per Cardiology.  Hypercarbia with concern for undiagnosed OSA She  would likely need an NIV prior to discharge to avoid CO2 narcosis after discharge She will likely need to follow-up with pulmonary for polysomnography. Discussed with the patient who was agreeable with the plan.  Improving severe acute hypoxic hypercarbic respiratory failure likely multifactorial secondary to pulmonary edema suspected underlying undiagnosed OSA Not on oxygen supplementation at baseline O2 saturation is improving with IV diuresing. She is currently on 2 L O2 saturation 100% on the monitor in the room. Hypercarbia persists when she is off the BiPAP. Will attempt to obtain NIV prior to DC and arrange for follow-up appointment with pulmonary outpatient. Personally reviewed chest x-ray done on 09/05/2020 which shows pulmonary edema and bilateral pleural effusions. Arterial blood gas taken on 09/05/2020 revealing pH of 7.194, PCO2 56.2, PaO2 67.5 on 3 L nasal cannula.  Repeated arterial blood gas 7.330/46.5/62.4 on 40% FiO2 on BiPAP.  VBG taken on 09/08/2020 revealed pH 7.402, PCO2 56 while off the BiPAP x1 day. Continue IV Lasix 80 mg twice daily per nephrology. Patient will likely need polysomnography outpatient to r/o OSA.  Arranging for follow up with Dr. [name].  Essential hypertension, on admission hypertensive emergency. Initially treated with nitroglycerin drip.  May have contributed to AKI. She is currently on Coreg 12.5 mg twice daily and IV Lasix 80 mg twice daily. Continue to closely monitor vital signs  Cardiorenal syndrome/ AKI on CKD 3B likely contributed by hypotension. Creatinine is downtrending from 3.13 to 2.72 to 2.45 to 2.40 To 2.35. Continue IV diuretics as recommended by nephrology.  She is on IV Lasix 80 mg twice daily.  We will  switch to p.o. once her creatinine plateaus. Presented with creatinine of 1.38. Continue to avoid nephrotoxic agents Continue daily renal panel.  Type 2 diabetes with hyperglycemia As hemoglobin A1c 7.0 on 08/31/2020 Continue  insulin sliding scale  Resolved post treatment: Hyperkalemia She has received Lokelma.   Continue renal diet, carbmodified as recommended by nephrology.  Daytime somnolence, suspect undiagnosed OSA. Will need to follow-up with pulmonary and have polysomnography Arranging follow-up appointment with Dr. [name] Obtain VBG on 09/08/2020 at 1500 p.m.  Chronic dCHF Last 2D echo 08/31/20 LVEF 50-55% G1DD Continue strict I&O Continue Daily weight Net INO positive 456 cc Management per cardiology  Anemia of chronic disease Hemoglobin uptrending No overt bleeding. Continue to monitor H&H  Hyperlipidemia/GERD/neuropathy Continue home regimen  IBS with diarrhea  stable. Continue home regimen    Code Status: Full code  Family Communication: Updated husband at bedside 5/24  Disposition Plan: Likely will discharge to home when cardiology and nephrology sign off and likely with an NIV.   Consultants:  Cardiology.  Nephrology  Curbsided with Pulmonary on 09/05/20.  Procedures:  2D echo  Antimicrobials:  None.  DVT prophylaxis: Subcu heparin 3 times daily  Status is: Inpatient    Dispo: The patient is from: Home               Anticipated d/c is to: Home when cardiology and nephrology sign off.               Patient currently not stable for discharge due to ongoing management of most recent chest pain, rule out ACS.   Difficult to place patient, not applicable.         Objective: Vitals:   09/09/20 0600 09/09/20 0642 09/09/20 0800 09/09/20 0813  BP: (!) 154/33  (!) 169/55 (!) 169/55  Pulse: 64  73 69  Resp: 13  (!) 24   Temp:   98.1 F (36.7 C)   TempSrc:   Oral   SpO2: 100%  100%   Weight:  71.2 kg    Height:        Intake/Output Summary (Last 24 hours) at 09/09/2020 0908 Last data filed at 09/09/2020 0800 Gross per 24 hour  Intake 430 ml  Output 2300 ml  Net -1870 ml   Filed Weights   09/07/20 0500 09/08/20 0500 09/09/20 0642  Weight: 78.5 kg  78.3 kg 71.2 kg    Exam:  . General: 70 y.o. year-old female.  Further.  No acute distress.  She is alert and oriented x3.  .  Cardiovascular: Regular rate and rhythm no rubs or gallops.   Marland Kitchen Respiratory: Mild rales at bases.  No wheezing noted.  Good inspiratory effort.   . Abdomen: Soft nontender normal bowel sounds present. .  Musculoskeletal: No lower extremity edema bilaterally.   . Skin: no ulcerative lesions noted. Marland Kitchen Psychiatry: Mood is appropriate for condition setting.   Data Reviewed: CBC: Recent Labs  Lab 09/04/20 0219 09/05/20 0012 09/06/20 0224 09/07/20 0248 09/08/20 0251  WBC 5.6 5.7 6.4 5.6 6.2  HGB 10.3* 10.5* 10.4* 9.8* 10.0*  HCT 34.7* 35.6* 34.6* 31.5* 32.9*  MCV 90.4 91.3 90.3 89.2 90.1  PLT 256 290 292 261 [id number]   Basic Metabolic Panel: Recent Labs  Lab 09/03/20 0302 09/04/20 0219 09/05/20 0012 09/06/20 0224 09/07/20 0248 09/08/20 0251 09/09/20 0537  NA 139   < > 138 140 140 141 142  K 5.4*   < > 5.0 4.2 3.7 3.5 3.3*  CL 106   < >  107 105 105 100 96*  CO2 29   < > '22 22 26 '$ 32 35*  GLUCOSE 199*   < > 136* 117* 143* 178* 204*  BUN 56*   < > 72* 74* 64* 65* 61*  CREATININE 3.03*   < > 3.13* 2.72* 2.45* 2.40* 2.35*  CALCIUM 7.8*   < > 8.4* 8.5* 8.5* 8.6* 8.4*  MG 2.1  --   --   --  2.2 2.1  --   PHOS 5.8*  --   --  5.1* 4.5  4.4 4.1  --    < > = values in this interval not displayed.   GFR: Estimated Creatinine Clearance: 21.2 mL/min (A) (by C-G formula based on SCr of 2.35 mg/dL (H)). Liver Function Tests: Recent Labs  Lab 09/03/20 0302 09/05/20 0012 09/06/20 0224 09/07/20 0248 09/08/20 0251  AST 22 29  --   --   --   ALT 19 22  --   --   --   ALKPHOS 58 69  --   --   --   BILITOT 0.3 0.6  --   --   --   PROT 6.6 7.1  --   --   --   ALBUMIN 3.2* 3.6 3.5 3.1* 3.3*   No results for input(s): LIPASE, AMYLASE in the last 168 hours. Recent Labs  Lab 09/05/20 0015  AMMONIA 14   Coagulation Profile: No results for input(s): INR,  PROTIME in the last 168 hours. Cardiac Enzymes: No results for input(s): CKTOTAL, CKMB, CKMBINDEX, TROPONINI in the last 168 hours. BNP (last 3 results) No results for input(s): PROBNP in the last 8760 hours. HbA1C: No results for input(s): HGBA1C in the last 72 hours. CBG: Recent Labs  Lab 09/08/20 0803 09/08/20 1226 09/08/20 1551 09/08/20 2152 09/09/20 0804  GLUCAP 151* 102* 166* 204* 189*   Lipid Profile: No results for input(s): CHOL, HDL, LDLCALC, TRIG, CHOLHDL, LDLDIRECT in the last 72 hours. Thyroid Function Tests: No results for input(s): TSH, T4TOTAL, FREET4, T3FREE, THYROIDAB in the last 72 hours. Anemia Panel: No results for input(s): VITAMINB12, FOLATE, FERRITIN, TIBC, IRON, RETICCTPCT in the last 72 hours. Urine analysis:    Component Value Date/Time   COLORURINE YELLOW 06/29/2015 1636   APPEARANCEUR HAZY (A) 06/29/2015 1636   LABSPEC 1.030 06/29/2015 1636   PHURINE 5.5 06/29/2015 1636   GLUCOSEU >1000 (A) 06/29/2015 1636   HGBUR NEGATIVE 06/29/2015 1636   BILIRUBINUR NEGATIVE 06/29/2015 1636   KETONESUR NEGATIVE 06/29/2015 1636   PROTEINUR NEGATIVE 06/29/2015 1636   NITRITE NEGATIVE 06/29/2015 1636   LEUKOCYTESUR NEGATIVE 06/29/2015 1636   Sepsis Labs: '@LABRCNTIP'$ (procalcitonin:4,lacticidven:4)  ) Recent Results (from the past 240 hour(s))  SARS CORONAVIRUS 2 (TAT 6-24 HRS) Nasopharyngeal Nasopharyngeal Swab     Status: None   Collection Time: 08/31/20  9:34 AM   Specimen: Nasopharyngeal Swab  Result Value Ref Range Status   SARS Coronavirus 2 NEGATIVE NEGATIVE Final    Comment: (NOTE) SARS-CoV-2 target nucleic acids are NOT DETECTED.  The SARS-CoV-2 RNA is generally detectable in upper and lower respiratory specimens during the acute phase of infection. Negative results do not preclude SARS-CoV-2 infection, do not rule out co-infections with other pathogens, and should not be used as the sole basis for treatment or other patient management  decisions. Negative results must be combined with clinical observations, patient history, and epidemiological information. The expected result is Negative.  Fact Sheet for Patients: SugarRoll.be  Fact Sheet for Healthcare Providers: https://www.woods-mathews.com/  This  test is not yet approved or cleared by the [place] and  has been authorized for detection and/or diagnosis of SARS-CoV-2 by FDA under an Emergency Use Authorization (EUA). This EUA will remain  in effect (meaning this test can be used) for the duration of the COVID-19 declaration under Se ction 564(b)(1) of the Act, 21 U.S.C. section 360bbb-3(b)(1), unless the authorization is terminated or revoked sooner.  Performed at [facility], [place] [address] [place], [place] [address]   MRSA PCR Screening     Status: None   Collection Time: 08/31/20  6:30 PM   Specimen: Nasopharyngeal  Result Value Ref Range Status   MRSA by PCR NEGATIVE NEGATIVE Final    Comment:        The GeneXpert MRSA Assay (FDA approved for NASAL specimens only), is one component of a comprehensive MRSA colonization surveillance program. It is not intended to diagnose MRSA infection nor to guide or monitor treatment for MRSA infections. Performed at [facility], [place] [address], [place],  [address]       Studies: No results found.  Scheduled Meds: . aspirin  81 mg Oral Daily  . carvedilol  12.5 mg Oral BID WC  . cetirizine  5 mg Oral QHS  . Chlorhexidine Gluconate Cloth  6 each Topical Daily  . cholecalciferol  2,000 Units Oral Daily  . cilostazol  50 mg Oral BID  . ezetimibe  10 mg Oral Daily  . famotidine  20 mg Oral Daily  . furosemide  80 mg Intravenous BID  . gabapentin  100 mg Oral TID  . heparin injection (subcutaneous)  5,000 Units Subcutaneous Q8H  . insulin aspart  0-5 Units Subcutaneous QHS  . insulin aspart  0-9 Units Subcutaneous TID WC   . lipase/protease/amylase  72,000 Units Oral TID with meals  . mouth rinse  15 mL Mouth Rinse BID  . potassium chloride  40 mEq Oral Once  . rosuvastatin  40 mg Oral Daily  . sodium chloride flush  3 mL Intravenous Q12H    Continuous Infusions: . sodium chloride       LOS: 9 days     Kayleen Memos, MD [facility] Pager [phone]  If 7PM-7AM, please contact night-coverage [email or web address] Password [email or web address] 09/09/2020, 9:08 AM

## 2020-09-09 NOTE — Progress Notes (Signed)
Progress Note  Patient Name: Molly [name] Date of Encounter: 09/09/2020  [facility] Cardiologist: Jenne Campus, MD   Subjective   Net -1.8 L yesterday, creatinine improving but starting to stabilize (3.1 > 2.7 > 2.5 > 2.4 >2.4).  BP 169/55 this morning.  Denies any chest pain or dyspnea.  Inpatient Medications    Scheduled Meds: . aspirin  81 mg Oral Daily  . carvedilol  12.5 mg Oral BID WC  . cetirizine  5 mg Oral QHS  . Chlorhexidine Gluconate Cloth  6 each Topical Daily  . cholecalciferol  2,000 Units Oral Daily  . cilostazol  50 mg Oral BID  . ezetimibe  10 mg Oral Daily  . famotidine  20 mg Oral Daily  . furosemide  80 mg Intravenous BID  . gabapentin  100 mg Oral TID  . heparin injection (subcutaneous)  5,000 Units Subcutaneous Q8H  . insulin aspart  0-5 Units Subcutaneous QHS  . insulin aspart  0-9 Units Subcutaneous TID WC  . lipase/protease/amylase  72,000 Units Oral TID with meals  . mouth rinse  15 mL Mouth Rinse BID  . rosuvastatin  40 mg Oral Daily  . sodium chloride flush  3 mL Intravenous Q12H   Continuous Infusions: . sodium chloride     PRN Meds: sodium chloride, acetaminophen, ALPRAZolam, diphenhydrAMINE, hydrALAZINE, ipratropium-albuterol, loperamide, nitroGLYCERIN, ondansetron (ZOFRAN) IV, sodium chloride flush   Vital Signs    Vitals:   09/09/20 0600 09/09/20 0642 09/09/20 0800 09/09/20 0813  BP: (!) 154/33  (!) 169/55 (!) 169/55  Pulse: 64  73 69  Resp: 13  (!) 24   Temp:   98.1 F (36.7 C)   TempSrc:   Oral   SpO2: 100%  100%   Weight:  71.2 kg    Height:        Intake/Output Summary (Last 24 hours) at 09/09/2020 1121 Last data filed at 09/09/2020 0800 Gross per 24 hour  Intake 290 ml  Output 2300 ml  Net -2010 ml   Last 3 Weights 09/09/2020 09/08/2020 09/07/2020  Weight (lbs) 156 lb 15.5 oz 172 lb 9.9 oz 173 lb 1 oz  Weight (kg) 71.2 kg 78.3 kg 78.5 kg      Telemetry    No new tracings - Personally Reviewed  ECG     Sinus rhythm with occasional PACs/PVCs - Personally Reviewed  Physical Exam   GEN: No acute distress.   Neck: No JVD Cardiac: RRR, no murmurs, rubs, or gallops.  Respiratory:  CTAB GI: Soft, nontender, non-distended  MS:  No LE edema; No deformity. Neuro:  Nonfocal  Psych: Normal affect   Labs    High Sensitivity Troponin:   Recent Labs  Lab 08/31/20 0720 08/31/20 0934  TROPONINIHS 91* 95*      Chemistry Recent Labs  Lab 09/03/20 0302 09/04/20 0219 09/05/20 0012 09/06/20 0224 09/07/20 0248 09/08/20 0251 09/09/20 0537  NA 139   < > 138 140 140 141 142  K 5.4*   < > 5.0 4.2 3.7 3.5 3.3*  CL 106   < > 107 105 105 100 96*  CO2 29   < > '22 22 26 '$ 32 35*  GLUCOSE 199*   < > 136* 117* 143* 178* 204*  BUN 56*   < > 72* 74* 64* 65* 61*  CREATININE 3.03*   < > 3.13* 2.72* 2.45* 2.40* 2.35*  CALCIUM 7.8*   < > 8.4* 8.5* 8.5* 8.6* 8.4*  PROT 6.6  --  7.1  --   --   --   --   ALBUMIN 3.2*  --  3.6 3.5 3.1* 3.3*  --   AST 22  --  29  --   --   --   --   ALT 19  --  22  --   --   --   --   ALKPHOS 58  --  69  --   --   --   --   BILITOT 0.3  --  0.6  --   --   --   --   GFRNONAA 16*   < > 16* 18* 21* 21* 22*  ANIONGAP 4*   < > '9 13 9 9 11   '$ < > = values in this interval not displayed.     Hematology Recent Labs  Lab 09/06/20 0224 09/07/20 0248 09/08/20 0251  WBC 6.4 5.6 6.2  RBC 3.83* 3.53* 3.65*  HGB 10.4* 9.8* 10.0*  HCT 34.6* 31.5* 32.9*  MCV 90.3 89.2 90.1  MCH 27.2 27.8 27.4  MCHC 30.1 31.1 30.4  RDW 14.6 14.6 14.6  PLT 292 261 263    BNP Recent Labs  Lab 09/05/20 0619  BNP 1,047.3*     DDimer  No results for input(s): DDIMER in the last 168 hours.   Radiology    No results found.  Cardiac Studies   Echocardiogram 08/31/20: 1. Left ventricular ejection fraction, by estimation, is 50 to 55%. Left  ventricular ejection fraction by 3D volume is 51 %. The left ventricle has  low normal function. The left ventricle has no regional wall motion   abnormalities. There is moderate  concentric left ventricular hypertrophy. Left ventricular diastolic  parameters are consistent with Grade I diastolic dysfunction (impaired  relaxation).  2. Right ventricular systolic function is normal. The right ventricular  size is normal.  3. The pericardial effusion is circumferential. There is no evidence of  cardiac tamponade.  4. The mitral valve is grossly normal. Trivial mitral valve  regurgitation. No evidence of mitral stenosis.  5. The aortic valve is tricuspid. There is mild calcification of the  aortic valve. There is mild thickening of the aortic valve. Aortic valve  regurgitation is not visualized. No aortic stenosis is present.  6. The inferior vena cava is normal in size with greater than 50%  respiratory variability, suggesting right atrial pressure of 3 mmHg.   Comparison(s): A prior study was performed on 11/11/2019. No significant  change from prior study. Prior images reviewed side by side.   Renal artery [place] 09/03/20: Right: Upper range 1-59% stenosis of the right renal artery by     velocities of visualized segments. RRV flow present.  Left: No evidence of hemodynamically significant left renal artery     stenosis. LRV flow present. Abnormal size for the left     kidney.   Abdominal aortogram 08/28/2019 -90% proximal left renal artery stenosis -70% proximal left SFA, 100% mid left SFA -100% left popliteal occlusion -100% mid right SFA   Patient Profile   70 y.o. female with a PMH of PAD (CTO of left SFA and popliteal artery 08/2019), Left renal artery stenosis, HTN, HLD, DM type 2, and CKD stage 3, who is being followed by cardiology for chest pain and elevated troponins.   Assessment & Plan    1. Chest pain and elevated troponin in patient with coronary artery calcifications: patient presented with chest pain which woke her from sleep 08/31/20. BP was  markedly elevated on arrival. EKG showed no  ischemic changes. HsTrop was elevated to 91>95 - trend not c/w ACS. BNP was elevated to 421. CXR suggested possible PNA, with CT suggesting atelectasis vs PNA. She remained afebrile, no leukocytosis, and procal was negative suggesting no acute infections. She was started on a nitro gtt with resolution of chest pain, now off. Echo showed EF 50-55%, no RWMA, moderate concentric LVH, trivial pericardial effusion, and no significant valvular abnormalities. It was felt that her chest pain was likely demand ischemia in the setting of hypertensive crisis, though suspicions are quite high for CAD given extensive coronary artery calcifications on CT, as well as history of PAD, poorly controlled HTN, HLD, and DM type 2. Initially planned to undergo LHC, however with decline in kidney function, cath has been delayed indefinitely. She received 48 hr IV heparin, now on aspirin, statin, and BBlocker.   - Continue aspirin and statin - Continue BBlocker - Could consider outpatient NST to r/o ischemia.  2. Hypertensive crisis: BP markedly elevated to 200s/90s on admission. Likely contributed to #1. She has known left renal artery stenosis, though this was not felt to have contributed to her possible flash pulmonary edema. Repeat renal dopplers this admission without left RAS but showed 1-59% right RAS. BP has been quite labile. She was transitioned form home regimen of amlodipine, metoprolol, and losartan to carvedilol, though with NPO status, has not received po antihypertensives, though has been given 2 doses of IV hydralazine '5mg'$   - Continue carvedilol, BP persistently elevated, will increase to 25 mg twice daily  3. Acute hypercarbic/hypoxic respiratory failure: worsening O2 status yesterday, stable on BiPAP. ABG with acidosis and CO2 56. Lactate wnl. IVF's stopped and she was started on IV lasix '80mg'$  BID per nephrology with improvement in renal function - Continue diuresis, suspect getting close to euvolemia and will  be able to transition to PO lasix soon - Continue to monitor strict I&Os and daily weights - Continue to monitor electrolytes closely and replete as needed to maintain K >4, Mg >2  4. PAD: no claudication complaints. - continue aspirin and statin  5. HLD: LDL 107 this admission on crestor '40mg'$  daily; goal >70 - Continue crestor - May benefit from referral to lipid clinic as it is unlikely she will reach goal with addition of zetia.   6. DM type 2: A1C 7.0 this admission  - Continue ISS per primary team - Could consider addition of SGLT-2 inhibitor.   7. Acute on chronic renal insufficiency: Cr peaked at 3.13, down to 2.4 today, though up from baseline 1.3. Nephrology following. Felt to be 2/2 ischemic ATN/overcorrection of BP with intermittent hypotension. Renal artery [place] with mild-moderate right RAS with small left kidney, felt to be non-contributory to her AKI. Received IVF's, though now being diuresed after developing acute respiratory failure and pulmonary edema.  - Appreciate nephrology assistance  - Continue to monitor renal function closely    For questions or updates, please contact [place] Please consult [email or web address] for contact info under       Signed, Donato Heinz, MD  09/09/2020, 11:21 AM

## 2020-09-09 NOTE — Discharge Instructions (Addendum)
https://www.nhlbi.nih.gov/files/docs/public/heart/dash_brief.pdf">  DASH Eating Plan DASH stands for Dietary Approaches to Stop Hypertension. The DASH eating plan is a healthy eating plan that has been shown to:  Reduce high blood pressure (hypertension).  Reduce your risk for type 2 diabetes, heart disease, and stroke.  Help with weight loss. What are tips for following this plan? Reading food labels  Check food labels for the amount of salt (sodium) per serving. Choose foods with less than 5 percent of the Daily Value of sodium. Generally, foods with less than 300 milligrams (mg) of sodium per serving fit into this eating plan.  To find whole grains, look for the word "whole" as the first word in the ingredient list. Shopping  Buy products labeled as "low-sodium" or "no salt added."  Buy fresh foods. Avoid canned foods and pre-made or frozen meals. Cooking  Avoid adding salt when cooking. Use salt-free seasonings or herbs instead of table salt or sea salt. Check with your health care provider or pharmacist before using salt substitutes.  Do not fry foods. Cook foods using healthy methods such as baking, boiling, grilling, roasting, and broiling instead.  Cook with heart-healthy oils, such as olive, canola, avocado, soybean, or sunflower oil. Meal planning  Eat a balanced diet that includes: ? 4 or more servings of fruits and 4 or more servings of vegetables each day. Try to fill one-half of your plate with fruits and vegetables. ? 6-8 servings of whole grains each day. ? Less than 6 oz (170 g) of lean meat, poultry, or fish each day. A 3-oz (85-g) serving of meat is about the same size as a deck of cards. One egg equals 1 oz (28 g). ? 2-3 servings of low-fat dairy each day. One serving is 1 cup (237 mL). ? 1 serving of nuts, seeds, or beans 5 times each week. ? 2-3 servings of heart-healthy fats. Healthy fats called omega-3 fatty acids are found in foods such as walnuts,  flaxseeds, fortified milks, and eggs. These fats are also found in cold-water fish, such as sardines, salmon, and mackerel.  Limit how much you eat of: ? Canned or prepackaged foods. ? Food that is high in trans fat, such as some fried foods. ? Food that is high in saturated fat, such as fatty meat. ? Desserts and other sweets, sugary drinks, and other foods with added sugar. ? Full-fat dairy products.  Do not salt foods before eating.  Do not eat more than 4 egg yolks a week.  Try to eat at least 2 vegetarian meals a week.  Eat more home-cooked food and less restaurant, buffet, and fast food.   Lifestyle  When eating at a restaurant, ask that your food be prepared with less salt or no salt, if possible.  If you drink alcohol: ? Limit how much you use to:  0-1 drink a day for women who are not pregnant.  0-2 drinks a day for men. ? Be aware of how much alcohol is in your drink. In the [place] one drink equals one 12 oz bottle of beer (355 mL), one 5 oz glass of wine (148 mL), or one 1 oz glass of hard liquor (44 mL). General information  Avoid eating more than 2,300 mg of salt a day. If you have hypertension, you may need to reduce your sodium intake to 1,500 mg a day.  Work with your health care provider to maintain a healthy body weight or to lose weight. Ask what an ideal weight is for   you.  Get at least 30 minutes of exercise that causes your heart to beat faster (aerobic exercise) most days of the week. Activities may include walking, swimming, or biking.  Work with your health care provider or dietitian to adjust your eating plan to your individual calorie needs. What foods should I eat? Fruits All fresh, dried, or frozen fruit. Canned fruit in natural juice (without added sugar). Vegetables Fresh or frozen vegetables (raw, steamed, roasted, or grilled). Low-sodium or reduced-sodium tomato and vegetable juice. Low-sodium or reduced-sodium tomato sauce and tomato paste.  Low-sodium or reduced-sodium canned vegetables. Grains Whole-grain or whole-wheat bread. Whole-grain or whole-wheat pasta. Brown rice. Oatmeal. Quinoa. Bulgur. Whole-grain and low-sodium cereals. Pita bread. Low-fat, low-sodium crackers. Whole-wheat flour tortillas. Meats and other proteins Skinless chicken or turkey. Ground chicken or turkey. Pork with fat trimmed off. Fish and seafood. Egg whites. Dried beans, peas, or lentils. Unsalted nuts, nut butters, and seeds. Unsalted canned beans. Lean cuts of beef with fat trimmed off. Low-sodium, lean precooked or cured meat, such as sausages or meat loaves. Dairy Low-fat (1%) or fat-free (skim) milk. Reduced-fat, low-fat, or fat-free cheeses. Nonfat, low-sodium ricotta or cottage cheese. Low-fat or nonfat yogurt. Low-fat, low-sodium cheese. Fats and oils Soft margarine without trans fats. Vegetable oil. Reduced-fat, low-fat, or light mayonnaise and salad dressings (reduced-sodium). Canola, safflower, olive, avocado, soybean, and sunflower oils. Avocado. Seasonings and condiments Herbs. Spices. Seasoning mixes without salt. Other foods Unsalted popcorn and pretzels. Fat-free sweets. The items listed above may not be a complete list of foods and beverages you can eat. Contact a dietitian for more information. What foods should I avoid? Fruits Canned fruit in a light or heavy syrup. Fried fruit. Fruit in cream or butter sauce. Vegetables Creamed or fried vegetables. Vegetables in a cheese sauce. Regular canned vegetables (not low-sodium or reduced-sodium). Regular canned tomato sauce and paste (not low-sodium or reduced-sodium). Regular tomato and vegetable juice (not low-sodium or reduced-sodium). Pickles. Olives. Grains Baked goods made with fat, such as croissants, muffins, or some breads. Dry pasta or rice meal packs. Meats and other proteins Fatty cuts of meat. Ribs. Fried meat. Bacon. Bologna, salami, and other precooked or cured meats, such as  sausages or meat loaves. Fat from the back of a pig (fatback). Bratwurst. Salted nuts and seeds. Canned beans with added salt. Canned or smoked fish. Whole eggs or egg yolks. Chicken or turkey with skin. Dairy Whole or 2% milk, cream, and half-and-half. Whole or full-fat cream cheese. Whole-fat or sweetened yogurt. Full-fat cheese. Nondairy creamers. Whipped toppings. Processed cheese and cheese spreads. Fats and oils Butter. Stick margarine. Lard. Shortening. Ghee. Bacon fat. Tropical oils, such as coconut, palm kernel, or palm oil. Seasonings and condiments Onion salt, garlic salt, seasoned salt, table salt, and sea salt. Worcestershire sauce. Tartar sauce. Barbecue sauce. Teriyaki sauce. Soy sauce, including reduced-sodium. Steak sauce. Canned and packaged gravies. Fish sauce. Oyster sauce. Cocktail sauce. Store-bought horseradish. Ketchup. Mustard. Meat flavorings and tenderizers. Bouillon cubes. Hot sauces. Pre-made or packaged marinades. Pre-made or packaged taco seasonings. Relishes. Regular salad dressings. Other foods Salted popcorn and pretzels. The items listed above may not be a complete list of foods and beverages you should avoid. Contact a dietitian for more information. Where to find more information  National Heart, Lung, and Blood Institute: www.nhlbi.nih.gov  American Heart Association: www.heart.org  Academy of Nutrition and Dietetics: www.eatright.org  National Kidney Foundation: www.kidney.org Summary  The DASH eating plan is a healthy eating plan that has been shown to   reduce high blood pressure (hypertension). It may also reduce your risk for type 2 diabetes, heart disease, and stroke.  When on the DASH eating plan, aim to eat more fresh fruits and vegetables, whole grains, lean proteins, low-fat dairy, and heart-healthy fats.  With the DASH eating plan, you should limit salt (sodium) intake to 2,300 mg a day. If you have hypertension, you may need to reduce your  sodium intake to 1,500 mg a day.  Work with your health care provider or dietitian to adjust your eating plan to your individual calorie needs. This information is not intended to replace advice given to you by your health care provider. Make sure you discuss any questions you have with your health care provider. Document Revised: 03/07/2019 Document Reviewed: 03/07/2019 Elsevier Patient Education  2021 [place].    --------------     Dennis Bast will have a Stress Test scheduled at [place]. Your doctor has ordered this test to check the blood flow in your heart arteries.  Please arrive 15 minutes early for paperwork. The whole test will take several hours. You may want to bring reading material to remain occupied while undergoing different parts of the test.  Instructions:  No food/drink after midnight the night before.  It is OK to take your morning meds with a sip of water EXCEPT for those types of medicines listed below or otherwise instructed.  No caffeine/decaf products 24 hours before, including medicines such as Excedrin or Goody Powders. Call if there are any questions.   Wear comfortable clothes and shoes.   Special Medication Instructions:  Remove nitroglycerin patches and do not take nitrate preparations such as Imdur/isosorbide the day of your test.  No Persantine/Theophylline or Aggrenox medicines should be used within 24 hours of the test.   If you are diabetic, please do not take your insulin or diabetes medicines the morning of the test.  What To Expect: When you arrive in the lab, the technician will inject a small amount of radioactive tracer into your arm through an IV while you are resting quietly. This helps [place] to form pictures of your heart. You will likely only feel a sting from the IV. After a waiting period, resting pictures will be obtained under a big camera. These are the "before" pictures.  Next, you will be prepped for the  stress portion of the test. This may include either walking on a treadmill or receiving a medicine that helps to dilate blood vessels in your heart to simulate the effect of exercise on your heart. If you are walking on a treadmill, you will walk at different paces to try to get your heart rate to a goal number that is based on your age. If your doctor has chosen the pharmacologic test, then you will receive a medicine through your IV that may cause temporary nausea, flushing, shortness of breath and sometimes chest discomfort or vomiting. This is typically short-lived and usually resolves quickly. If you experience symptoms, that does not automatically mean the test is abnormal. Some patients do not experience any symptoms at all. Your blood pressure and heart rate will be monitored, and we will be watching your EKG on a computer screen for any changes. During this portion of the test, the radiologist will inject another small amount of radioactive tracer into your IV. After a waiting period, you will undergo a second set of pictures. These are the "after" pictures.  The doctor reading the test will compare the  before-and-after images to look for evidence of heart blockages or heart weakness. The test usually takes 1 day to complete, but in certain instances (for example, if a patient is over a certain weight limit), the test may be done over the span of 2 days.

## 2020-09-10 ENCOUNTER — Inpatient Hospital Stay (HOSPITAL_COMMUNITY): Payer: Medicare Other

## 2020-09-10 ENCOUNTER — Encounter (HOSPITAL_COMMUNITY): Payer: Self-pay | Admitting: Internal Medicine

## 2020-09-10 DIAGNOSIS — R079 Chest pain, unspecified: Secondary | ICD-10-CM | POA: Diagnosis not present

## 2020-09-10 LAB — BASIC METABOLIC PANEL
Anion gap: 9 (ref 5–15)
Anion gap: 8 (ref 5–15)
BUN: 60 mg/dL — ABNORMAL HIGH (ref 8–23)
BUN: 57 mg/dL — ABNORMAL HIGH (ref 8–23)
CO2: 39 mmol/L — ABNORMAL HIGH (ref 22–32)
CO2: 38 mmol/L — ABNORMAL HIGH (ref 22–32)
Calcium: 8.7 mg/dL — ABNORMAL LOW (ref 8.9–10.3)
Calcium: 8.4 mg/dL — ABNORMAL LOW (ref 8.9–10.3)
Chloride: 92 mmol/L — ABNORMAL LOW (ref 98–111)
Chloride: 94 mmol/L — ABNORMAL LOW (ref 98–111)
Creatinine, Ser: 2.75 mg/dL — ABNORMAL HIGH (ref 0.44–1.00)
Creatinine, Ser: 2.5 mg/dL — ABNORMAL HIGH (ref 0.44–1.00)
GFR, Estimated: 18 mL/min — ABNORMAL LOW (ref >60)
GFR, Estimated: 20 mL/min — ABNORMAL LOW (ref >60)
Glucose, Bld: 221 mg/dL — ABNORMAL HIGH (ref 70–99)
Glucose, Bld: 196 mg/dL — ABNORMAL HIGH (ref 70–99)
Potassium: 3.4 mmol/L — ABNORMAL LOW (ref 3.5–5.1)
Potassium: 3.5 mmol/L (ref 3.5–5.1)
Sodium: 140 mmol/L (ref 135–145)
Sodium: 140 mmol/L (ref 135–145)

## 2020-09-10 LAB — GLUCOSE, CAPILLARY
Glucose-Capillary: 248 mg/dL — ABNORMAL HIGH (ref 70–99)
Glucose-Capillary: 217 mg/dL — ABNORMAL HIGH (ref 70–99)
Glucose-Capillary: 208 mg/dL — ABNORMAL HIGH (ref 70–99)
Glucose-Capillary: 139 mg/dL — ABNORMAL HIGH (ref 70–99)

## 2020-09-10 MED ORDER — POTASSIUM CHLORIDE CRYS ER 20 MEQ PO TBCR
20.0000 meq | EXTENDED_RELEASE_TABLET | Freq: Once | ORAL | Status: AC
  Administered 2020-09-10: 20 meq via ORAL
  Filled 2020-09-10: qty 1

## 2020-09-10 MED ORDER — INSULIN GLARGINE 100 UNIT/ML ~~LOC~~ SOLN
7.0000 [IU] | Freq: Every day | SUBCUTANEOUS | Status: DC
  Administered 2020-09-10 – 2020-09-13 (×4): 7 [IU] via SUBCUTANEOUS
  Filled 2020-09-10 (×4): qty 0.07

## 2020-09-10 MED ORDER — AMLODIPINE BESYLATE 10 MG PO TABS
10.0000 mg | ORAL_TABLET | Freq: Every day | ORAL | Status: DC
  Administered 2020-09-10 – 2020-09-12 (×3): 10 mg via ORAL
  Filled 2020-09-10 (×3): qty 1

## 2020-09-10 MED ORDER — FUROSEMIDE 40 MG PO TABS
40.0000 mg | ORAL_TABLET | Freq: Every day | ORAL | Status: DC
  Administered 2020-09-11 – 2020-09-13 (×3): 40 mg via ORAL
  Filled 2020-09-10 (×3): qty 1

## 2020-09-10 NOTE — Progress Notes (Signed)
PROGRESS NOTE  Molly [name] [id number] DOB: December 21, 1950 DOA: 08/31/2020 PCP: [name], MD  HPI/Recap of past 24 hours: Molly [name] is a 70 y.o. female with medical history significant of DM2, HTN, CKD3b, PAD. Presenting with left-sided nonradiating chest pain associated with dyspnea and nausea.  Work-up in the ED revealed hypertensive emergency with SBP in the 200s and elevated troponin.  She was started on heparin drip and cardiology was consulted.  Seen by cardiology.  Due to worsening renal function heart cath was postponed.  She had a VQ scan that was negative for pulmonary embolism.  Hospital course complicated by worsening AKI on CKD 3B with oliguria.  Acute hypoxic hypercarbic respiratory failure secondary to pulmonary edema and possible undiagnosed OSA.  Respiratory status improved with BiPAP as needed during the day and BiPAP at night.  IV diuresing started on 09/05/2020 in the setting of cardiorenal syndrome with improvement of renal function and respiratory status.  Repeated venous blood gas PCO2 after 1 night without BiPAP was elevated at 56.   09/10/20: No specific complaints this morning.  She was wearing her BiPAP.  IV Lasix switched to oral per cardiology due to bump in creatinine from 2.3 to 2.75.  We will repeat her chest x-ray today.   Assessment/Plan: Active Problems:   Hypertensive emergency   Chest pain   Elevated troponin  Chest pain rule out ACS Trop 91, 95 Seen by cardiology, AKI precluded heart cath. She received 48 hours of heparin drip, now discontinued.  Off nitroglycerin drip.   2D echo done on 08/31/2020, the left ventricle has no regional wall motion abnormality, moderate concentric LVH Aspirin 81 mg daily, Pletal 50 mg twice daily, Zetia 10 mg daily, Crestor 40 mg daily, Coreg 25 mg twice daily. IV Lasix 80 mg twice daily added on 09/05/2020, stopped on 09/10/2020 due to bump in creatinine from 2.3 to 2.7 Per Cardiology will switch to oral  Lasix. Continue to follow renal function and electrolytes.  Hypercarbia with concern for undiagnosed OSA She would likely need an NIV prior to discharge to avoid CO2 narcosis after discharge She will likely need to follow-up with pulmonary for polysomnography. Discussed with the patient who was agreeable with the plan.  Cardiorenal syndrome/ AKI on CKD 3B likely contributed by hypotension. Creatinine is from 3.13 to 2.72 to 2.45 to 2.40 To 2.35, to 2.75. Repeat BMP this afternoon. IV Lasix discontinued on 09/10/2020 Continue to closely monitor urine output. Presented with creatinine of 1.38. Continue to avoid nephrotoxic agents Continue daily renal panel.  Hypokalemia Repleted orally. Recheck with BMP this afternoon and tomorrow.  Improving severe acute hypoxic hypercarbic respiratory failure likely multifactorial secondary to pulmonary edema suspected underlying undiagnosed OSA Not on oxygen supplementation at baseline O2 saturation is improving with IV diuresing. She is currently on 2 L O2 saturation 100% on the monitor in the room. Hypercarbia persists when she is off the BiPAP. Will attempt to obtain NIV prior to DC and arrange for follow-up appointment with pulmonary outpatient. Personally reviewed chest x-ray done on 09/05/2020 which shows pulmonary edema and bilateral pleural effusions. Repeat chest x-ray on 09/10/2020. Arterial blood gas taken on 09/05/2020 revealing pH of 7.194, PCO2 56.2, PaO2 67.5 on 3 L nasal cannula.  Repeated arterial blood gas 7.330/46.5/62.4 on 40% FiO2 on BiPAP.  VBG taken on 09/08/2020 revealed pH 7.402, PCO2 56 while off the BiPAP x1 day. Patient will likely need polysomnography outpatient to r/o OSA.  Arranging for follow up with Dr.  Olalere.  Essential hypertension, on admission hypertensive emergency. Initially treated with nitroglycerin drip.  May have contributed to AKI. She is currently on Coreg 25 mg twice daily Continue to closely monitor  vital signs  Sinus bradycardia in the setting of beta-blocker use Heart rate in the 50s Defer management to cardiology. Continue to monitor on telemetry.  Type 2 diabetes with hyperglycemia As hemoglobin A1c 7.0 on 08/31/2020 Continue insulin sliding scale  Resolved post treatment: Hyperkalemia She has received Lokelma.   Continue renal diet, carbmodified as recommended by nephrology.  Daytime somnolence, suspect undiagnosed OSA. Will need to follow-up with pulmonary and have polysomnography Arranging follow-up appointment with Dr. [name] Obtain VBG on 09/08/2020 at 1500 p.m.  Chronic dCHF Last 2D echo 08/31/20 LVEF 50-55% G1DD Continue strict I&O Continue Daily weight Net I&O -654 cc. Management per cardiology  Anemia of chronic disease Hemoglobin uptrending No overt bleeding. Continue to monitor H&H  Hyperlipidemia/GERD/neuropathy Continue home regimen  IBS with diarrhea  stable. Continue home regimen    Code Status: Full code  Family Communication: Updated husband via phone on 09/10/2020.  Disposition Plan: Likely will discharge to home when cardiology signs off and likely with an NIV.   Consultants:  Cardiology.  Nephrology  Curbsided with Pulmonary on 09/05/20.  Procedures:  2D echo  Antimicrobials:  None.  DVT prophylaxis: Subcu heparin 3 times daily  Status is: Inpatient    Dispo: The patient is from: Home               Anticipated d/c is to: Home when cardiology signs off.               Patient currently not stable for discharge due to ongoing management of most recent chest pain, rule out ACS.   Difficult to place patient, not applicable.         Objective: Vitals:   09/09/20 2109 09/10/20 0119 09/10/20 0437 09/10/20 0802  BP: 133/74  (!) 147/57 (!) 153/53  Pulse: 60 69 62 (!) 58  Resp:      Temp: 98.4 F (36.9 C) 98.5 F (36.9 C) 98.2 F (36.8 C)   TempSrc: Oral Oral Oral   SpO2: 96%  99%   Weight:   71 kg   Height:         Intake/Output Summary (Last 24 hours) at 09/10/2020 1251 Last data filed at 09/10/2020 1130 Gross per 24 hour  Intake 240 ml  Output 1350 ml  Net -1110 ml   Filed Weights   09/08/20 0500 09/09/20 0642 09/10/20 0437  Weight: 78.3 kg 71.2 kg 71 kg    Exam:  . General: 70 y.o. year-old female.  Pleasant in no acute distress.  She is alert and oriented x3.  She is on BiPAP.  Marland Kitchen  Cardiovascular: Regular rate and rhythm no rubs or gallops. Marland Kitchen Respiratory: Mild rales at bases no wheezing noted.  Good inspiratory effort.   . Abdomen: Soft Nontender Normal Bowel Sounds Present. .  Musculoskeletal: No lower extremity edema bilaterally.   . Skin: No ulcerative lesions noted.   Marland Kitchen Psychiatry: Mood is appropriate for condition and setting.   Data Reviewed: CBC: Recent Labs  Lab 09/04/20 0219 09/05/20 0012 09/06/20 0224 09/07/20 0248 09/08/20 0251  WBC 5.6 5.7 6.4 5.6 6.2  HGB 10.3* 10.5* 10.4* 9.8* 10.0*  HCT 34.7* 35.6* 34.6* 31.5* 32.9*  MCV 90.4 91.3 90.3 89.2 90.1  PLT 256 290 292 261 [id number]   Basic Metabolic Panel: Recent Labs  Lab  09/06/20 0224 09/07/20 0248 09/08/20 0251 09/09/20 0537 09/10/20 0519  NA 140 140 141 142 140  K 4.2 3.7 3.5 3.3* 3.4*  CL 105 105 100 96* 92*  CO2 22 26 32 35* 39*  GLUCOSE 117* 143* 178* 204* 221*  BUN 74* 64* 65* 61* 60*  CREATININE 2.72* 2.45* 2.40* 2.35* 2.75*  CALCIUM 8.5* 8.5* 8.6* 8.4* 8.7*  MG  --  2.2 2.1  --   --   PHOS 5.1* 4.5  4.4 4.1  --   --    GFR: Estimated Creatinine Clearance: 18.1 mL/min (A) (by C-G formula based on SCr of 2.75 mg/dL (H)). Liver Function Tests: Recent Labs  Lab 09/05/20 0012 09/06/20 0224 09/07/20 0248 09/08/20 0251  AST 29  --   --   --   ALT 22  --   --   --   ALKPHOS 69  --   --   --   BILITOT 0.6  --   --   --   PROT 7.1  --   --   --   ALBUMIN 3.6 3.5 3.1* 3.3*   No results for input(s): LIPASE, AMYLASE in the last 168 hours. Recent Labs  Lab 09/05/20 0015  AMMONIA 14    Coagulation Profile: No results for input(s): INR, PROTIME in the last 168 hours. Cardiac Enzymes: No results for input(s): CKTOTAL, CKMB, CKMBINDEX, TROPONINI in the last 168 hours. BNP (last 3 results) No results for input(s): PROBNP in the last 8760 hours. HbA1C: No results for input(s): HGBA1C in the last 72 hours. CBG: Recent Labs  Lab 09/09/20 1152 09/09/20 1642 09/09/20 2105 09/10/20 0750 09/10/20 1124  GLUCAP 157* 220* 246* 248* 217*   Lipid Profile: No results for input(s): CHOL, HDL, LDLCALC, TRIG, CHOLHDL, LDLDIRECT in the last 72 hours. Thyroid Function Tests: No results for input(s): TSH, T4TOTAL, FREET4, T3FREE, THYROIDAB in the last 72 hours. Anemia Panel: No results for input(s): VITAMINB12, FOLATE, FERRITIN, TIBC, IRON, RETICCTPCT in the last 72 hours. Urine analysis:    Component Value Date/Time   COLORURINE YELLOW 06/29/2015 1636   APPEARANCEUR HAZY (A) 06/29/2015 1636   LABSPEC 1.030 06/29/2015 1636   PHURINE 5.5 06/29/2015 1636   GLUCOSEU >1000 (A) 06/29/2015 1636   HGBUR NEGATIVE 06/29/2015 1636   BILIRUBINUR NEGATIVE 06/29/2015 1636   KETONESUR NEGATIVE 06/29/2015 1636   PROTEINUR NEGATIVE 06/29/2015 1636   NITRITE NEGATIVE 06/29/2015 1636   LEUKOCYTESUR NEGATIVE 06/29/2015 1636   Sepsis Labs: '@LABRCNTIP'$ (procalcitonin:4,lacticidven:4)  ) Recent Results (from the past 240 hour(s))  MRSA PCR Screening     Status: None   Collection Time: 08/31/20  6:30 PM   Specimen: Nasopharyngeal  Result Value Ref Range Status   MRSA by PCR NEGATIVE NEGATIVE Final    Comment:        The GeneXpert MRSA Assay (FDA approved for NASAL specimens only), is one component of a comprehensive MRSA colonization surveillance program. It is not intended to diagnose MRSA infection nor to guide or monitor treatment for MRSA infections. Performed at [facility], [place] [address], [place], [place] [address]       Studies: No results  found.  Scheduled Meds: . amLODipine  10 mg Oral QHS  . aspirin  81 mg Oral Daily  . carvedilol  25 mg Oral BID WC  . cetirizine  5 mg Oral QHS  . Chlorhexidine Gluconate Cloth  6 each Topical Daily  . cholecalciferol  2,000 Units Oral Daily  . cilostazol  50 mg Oral BID  . ezetimibe  10 mg Oral Daily  . famotidine  20 mg Oral Daily  . gabapentin  100 mg Oral TID  . heparin injection (subcutaneous)  5,000 Units Subcutaneous Q8H  . insulin aspart  0-5 Units Subcutaneous QHS  . insulin aspart  0-9 Units Subcutaneous TID WC  . lipase/protease/amylase  72,000 Units Oral TID with meals  . mouth rinse  15 mL Mouth Rinse BID  . potassium chloride  20 mEq Oral Once  . rosuvastatin  40 mg Oral Daily  . sodium chloride flush  3 mL Intravenous Q12H    Continuous Infusions: . sodium chloride       LOS: 10 days     Kayleen Memos, MD [facility] Pager [phone]  If 7PM-7AM, please contact night-coverage [email or web address] Password [facility] 09/10/2020, 12:51 PM

## 2020-09-10 NOTE — Progress Notes (Addendum)
Physical Therapy Treatment Patient Details Name: Molly [name] MRN: [id number] DOB: May 20, 1950 Today's Date: 09/10/2020    History of Present Illness 70 y.o. female presented with left-sided nonradiating chest pain associated with dyspnea and nausea.  Work-up in the ED revealed hypertensive emergency with SBP in the 200s and elevated troponin. Dx of possible NSTEMI, AKI. Pt with medical history significant of DM2, HTN, CKD3b, PAD.    PT Comments    Pt seen on [place]. she is in bed with spouse at bedside.  On 2 lts at rest 100%.  Trial RA.  Assisted OOB to amb to bathroom was some what difficult due to her instability/balance esp with turns.  Pt also present with dyskenesia which she stated started "just before" admit.   General bed mobility comments: required increased time, use of rail and slight assist with bed pad to complete scooting to EOB. General transfer comment: one VC on proper hand placement with sit to stand to avoid pulling up on walker VERY unsteady with turns.  Toilet transfer pt unable to maintain a safe static standing balance as perform self peri care.. General Gait Details: tolerated an increased distance but rtequired the use of a walker for gait instability.  VERY unsteady with her turns and present with Mod dyskenesia L side > R.  Avg RA was 90% with 2/4 dyspnea.  "I do feel winded" limited activity tolerance requiring back to bed.  Avg HR with activity was 74.  Pt plans to D/C to home with spouse.  Walker was delivered and is in room.  SATURATION QUALIFICATIONS: (This note is used to comply with regulatory documentation for home oxygen)  Patient Saturations on Room Air at Rest = 96%  Patient Saturations on Room Air while Ambulating 85 feet = 90%  Please briefly explain why patient needs home oxygen:  Pt does NOT require supplemental oxygen   Follow Up Recommendations  Home health PT;Supervision for mobility/OOB     Equipment Recommendations  Rolling walker with 5"  wheels    Recommendations for Other Services       Precautions / Restrictions Precautions Precautions: Fall Precaution Comments: monitor O2    Mobility  Bed Mobility Overal bed mobility: Needs Assistance Bed Mobility: Supine to Sit;Sit to Supine     Supine to sit: Supervision;Min guard Sit to supine: Min guard;Min assist   General bed mobility comments: required increased time, use of rail and slight assist with bed pad to complete scooting to EOB    Transfers Overall transfer level: Needs assistance Equipment used: Rolling walker (2 wheeled) Transfers: Sit to/from [facility] Sit to Stand: Min guard Stand pivot transfers: Min guard;Min assist;Mod assist       General transfer comment: one VC on proper hand placement with sit to stand to avoid pulling up on walker VERY unsteady with turns.  Toilet transfer pt unable to maintain a safe static standing balance as perform self peri care.  Ambulation/Gait Ambulation/Gait assistance: Min guard;Min assist Gait Distance (Feet): 85 Feet Assistive device: Rolling walker (2 wheeled) Gait Pattern/deviations: Step-through pattern;Decreased stride length Gait velocity: decr   General Gait Details: tolerated an increased distance but rtequired the use of a walker for gait instability.  VERY unsteady with her turns and present with Mod dyskenesia L side > R.  Avg RA was 90% with 2/4 dyspnea.  "I do feel winded" limited activity tolerance requiring back to bed.   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) Very Publishing copy Comments         Pertinent Vitals/Pain Pain Assessment: No/denies pain    Home Living                      Prior Function            PT Goals (current goals can now be found in the care plan section) Progress towards PT goals: Progressing toward goals    Frequency    Min 3X/week      PT Plan Current plan remains appropriate    Co-evaluation              AM-PAC PT "6 Clicks" Mobility   Outcome Measure  Help needed turning from your back to your side while in a flat bed without using bedrails?: A Little Help needed moving from lying on your back to sitting on the side of a flat bed without using bedrails?: A Little Help needed moving to and from a bed to a chair (including a wheelchair)?: A Little Help needed standing up from a chair using your arms (e.g., wheelchair or bedside chair)?: A Little Help needed to walk in hospital room?: A Lot Help needed climbing 3-5 steps with a railing? : A Lot 6 Click Score: 16    End of Session Equipment Utilized During Treatment: Gait belt Activity Tolerance: Patient limited by fatigue Patient left: in bed;with bed alarm set;with family/visitor present;with call bell/phone within reach Nurse Communication: Mobility status PT Visit Diagnosis: Difficulty in walking, not elsewhere classified (R26.2)     Time: [id number] PT Time Calculation (min) (ACUTE ONLY): 25 min  Charges:  $Gait Training: 8-22 mins $Therapeutic Activity: 8-22 mins                     Rica Koyanagi  PTA Acute  Rehabilitation Services Pager      [phone] Office      [phone]

## 2020-09-10 NOTE — Progress Notes (Signed)
Inpatient Diabetes Program Recommendations  AACE/ADA: New Consensus Statement on Inpatient Glycemic Control (2015)  Target Ranges:  Prepandial:   less than 140 mg/dL      Peak postprandial:   less than 180 mg/dL (1-2 hours)      Critically ill patients:  140 - 180 mg/dL   Lab Results  Component Value Date   GLUCAP 248 (H) 09/10/2020   HGBA1C 7.0 (H) 08/31/2020    Review of Glycemic Control Results for Molly [name], Molly [name] (MRN [id number]) as of 09/10/2020 09:19  Ref. Range 09/09/2020 08:04 09/09/2020 11:52 09/09/2020 16:42 09/09/2020 21:05 09/10/2020 07:50  Glucose-Capillary Latest Ref Range: 70 - 99 mg/dL 189 (H) 157 (H) 220 (H) 246 (H) 248 (H)   Diabetes history:  DM2 Outpatient Diabetes medications:  70/30 16-20 units BID Current orders for Inpatient glycemic control:  Novolog 0-9 units TID and 0-5 units QHS  Inpatient Diabetes Program Recommendations:     Might consider:  Lantus 12 units daily   Will continue to follow while inpatient.  Thank you, Reche Dixon, RN, BSN Diabetes Coordinator Inpatient Diabetes Program [phone] (team pager from 8a-5p)

## 2020-09-10 NOTE — Progress Notes (Addendum)
Progress Note  Patient Name: Molly [name] Date of Encounter: 09/10/2020  Primary Cardiologist: Jenne Campus, MD   Subjective   Feeling well today. Creatinine bumped overnight. Would transition to PO Lasix and follow BMET. Wants to go home. No chest pain or SOB.   Inpatient Medications    Scheduled Meds: . aspirin  81 mg Oral Daily  . carvedilol  25 mg Oral BID WC  . cetirizine  5 mg Oral QHS  . Chlorhexidine Gluconate Cloth  6 each Topical Daily  . cholecalciferol  2,000 Units Oral Daily  . cilostazol  50 mg Oral BID  . ezetimibe  10 mg Oral Daily  . famotidine  20 mg Oral Daily  . gabapentin  100 mg Oral TID  . heparin injection (subcutaneous)  5,000 Units Subcutaneous Q8H  . insulin aspart  0-5 Units Subcutaneous QHS  . insulin aspart  0-9 Units Subcutaneous TID WC  . lipase/protease/amylase  72,000 Units Oral TID with meals  . mouth rinse  15 mL Mouth Rinse BID  . rosuvastatin  40 mg Oral Daily  . sodium chloride flush  3 mL Intravenous Q12H   Continuous Infusions: . sodium chloride     PRN Meds: sodium chloride, acetaminophen, ALPRAZolam, diphenhydrAMINE, hydrALAZINE, ipratropium-albuterol, loperamide, nitroGLYCERIN, ondansetron (ZOFRAN) IV, sodium chloride flush   Vital Signs    Vitals:   09/09/20 2109 09/10/20 0119 09/10/20 0437 09/10/20 0802  BP: 133/74  (!) 147/57 (!) 153/53  Pulse: 60 69 62 (!) 58  Resp:      Temp: 98.4 F (36.9 C) 98.5 F (36.9 C) 98.2 F (36.8 C)   TempSrc: Oral Oral Oral   SpO2: 96%  99%   Weight:   71 kg   Height:        Intake/Output Summary (Last 24 hours) at 09/10/2020 0952 Last data filed at 09/10/2020 0120 Gross per 24 hour  Intake 240 ml  Output 700 ml  Net -460 ml   Filed Weights   09/08/20 0500 09/09/20 0642 09/10/20 0437  Weight: 78.3 kg 71.2 kg 71 kg    Physical Exam   General: Well developed, well nourished, NAD Lungs: Diminished in bilateral lobes. Breathing is unlabored. Cardiovascular: RRR with  S1 S2. No murmurs Abdomen: Soft, non-tender, non-distended. No obvious abdominal masses. Extremities: No edema. Radial  pulses 2+ bilaterally Neuro: Alert and oriented. No focal deficits. No facial asymmetry. MAE spontaneously. Psych: Responds to questions appropriately with normal affect.    Labs    Chemistry Recent Labs  Lab 09/05/20 0012 09/06/20 0224 09/07/20 0248 09/08/20 0251 09/09/20 0537 09/10/20 0519  NA 138 140 140 141 142 140  K 5.0 4.2 3.7 3.5 3.3* 3.4*  CL 107 105 105 100 96* 92*  CO2 '22 22 26 '$ 32 35* 39*  GLUCOSE 136* 117* 143* 178* 204* 221*  BUN 72* 74* 64* 65* 61* 60*  CREATININE 3.13* 2.72* 2.45* 2.40* 2.35* 2.75*  CALCIUM 8.4* 8.5* 8.5* 8.6* 8.4* 8.7*  PROT 7.1  --   --   --   --   --   ALBUMIN 3.6 3.5 3.1* 3.3*  --   --   AST 29  --   --   --   --   --   ALT 22  --   --   --   --   --   ALKPHOS 69  --   --   --   --   --   BILITOT 0.6  --   --   --   --   --  GFRNONAA 16* 18* 21* 21* 22* 18*  ANIONGAP '9 13 9 9 11 9     '$ Hematology Recent Labs  Lab 09/06/20 0224 09/07/20 0248 09/08/20 0251  WBC 6.4 5.6 6.2  RBC 3.83* 3.53* 3.65*  HGB 10.4* 9.8* 10.0*  HCT 34.6* 31.5* 32.9*  MCV 90.3 89.2 90.1  MCH 27.2 27.8 27.4  MCHC 30.1 31.1 30.4  RDW 14.6 14.6 14.6  PLT 292 261 263    Cardiac EnzymesNo results for input(s): TROPONINI in the last 168 hours. No results for input(s): TROPIPOC in the last 168 hours.   BNP Recent Labs  Lab 09/05/20 0619  BNP 1,047.3*     DDimer No results for input(s): DDIMER in the last 168 hours.   Radiology    No results found.  Telemetry    09/10/20 NSR- Personally Reviewed  ECG    No new tracing as of 09/10/20- Personally Reviewed  Cardiac Studies   Echocardiogram 08/31/20: 1. Left ventricular ejection fraction, by estimation, is 50 to 55%. Left  ventricular ejection fraction by 3D volume is 51 %. The left ventricle has  low normal function. The left ventricle has no regional wall motion   abnormalities. There is moderate  concentric left ventricular hypertrophy. Left ventricular diastolic  parameters are consistent with Grade I diastolic dysfunction (impaired  relaxation).  2. Right ventricular systolic function is normal. The right ventricular  size is normal.  3. The pericardial effusion is circumferential. There is no evidence of  cardiac tamponade.  4. The mitral valve is grossly normal. Trivial mitral valve  regurgitation. No evidence of mitral stenosis.  5. The aortic valve is tricuspid. There is mild calcification of the  aortic valve. There is mild thickening of the aortic valve. Aortic valve  regurgitation is not visualized. No aortic stenosis is present.  6. The inferior vena cava is normal in size with greater than 50%  respiratory variability, suggesting right atrial pressure of 3 mmHg.   Comparison(s): A prior study was performed on 11/11/2019. No significant  change from prior study. Prior images reviewed side by side.   Renal artery [place] 09/03/20: Right: Upper range 1-59% stenosis of the right renal artery by     velocities of visualized segments. RRV flow present.  Left: No evidence of hemodynamically significant left renal artery     stenosis. LRV flow present. Abnormal size for the left     kidney.   Abdominal aortogram 08/28/2019 -90% proximal left renal artery stenosis -70% proximal left SFA, 100% mid left SFA -100% left popliteal occlusion -100% mid right SFA  Patient Profile     70 y.o. female with a PMH of PAD (CTO of left SFA and popliteal artery 08/2019), Left renal artery stenosis, HTN, HLD, DM type 2, and CKD stage 3, who is being followed by cardiology for chest pain and elevated troponins.  Assessment & Plan    1. Chest pain and elevated troponin in patient with coronary artery calcifications: -Pt presented with chest pain which woke her from sleep 08/31/20 however BP was found to be markedly elevated on  arrival. HsTrop was elevated to 91>95 with no EKG changes. BNP was elevated to 421.  -Echo showed EF 50-55%, no RWMA, moderate concentric LVH, trivial pericardial effusion, and no significant valvular abnormalities.  -Chest pain felt likely to represent demand ischemia in the setting of hypertensive crisis, though suspicions are quite high for CAD given extensive coronary artery calcifications on CT and with a hx PAD, poorly controlled HTN,  HLD, and DM type 2 -Initially planned to undergo LHC, however with decline in kidney function this was deferred. She received 48 hr IV heparin and is now on ASA, statin and BB -Could consider outpatient NST to r/o ischemia. -No chest pain   2. Hypertensive crisis: -BP markedly elevated to 200s/90s on admission>>has known left renal artery stenosis, though this was not felt to have contributed to her possible flash pulmonary edema.  -Repeat renal dopplers this admission without left RAS but showed 1-59% right RAS. BP has been quite labile.  -Continue carvedilol '25mg'$  BID -Add PTA amlodipine '10mg'$  PO QD -No ACE/ARB in the setting of renal dysfunction    3. Acute hypercarbic/hypoxic respiratory failure:  -Improved, started on IV lasix '80mg'$  BID per nephrology with improvement in renal function however increase in creatinine today  -Transition to PO Lasix '40mg'$  QD however would hold today and start this tomorrow  -Weight, 156lb  -I&O, negative 856m   4. PAD: -No claudication complaints -Continue ASA and statin  5. HLD: -LDL 107 this admission on crestor '40mg'$  daily with a goal >70 -Continue crestor -May benefit from referral to lipid clinic as it is unlikely she will reach goal with addition of zetia.   6. DM2:  -HbA1C 7.0 this admission  -Continue ISS per primary team -Could consider addition of SGLT-2 inhibitor  7. Acute on chronic renal insufficiency:  -Cr peaked at 2.75 today with a peak at 3.13 -Renal artery [place] mild-moderate right RAS  with small left kidney, felt to be non-contributory to her AKI -Appreciate nephrology assistance  -Continue to monitor renal function closely -Change Lasix to PO dosing starting tomorrow    Signed, JKathyrn DrownNP-C HLochbuiePager: [phone] 72315/27/2022, 9:52 AM     For questions or updates, please contact   Please consult [email or web address] for contact info under Cardiology/STEMI.  Patient seen and examined.  Agree with above documentation.  On exam, patient is alert and oriented, regular rate and rhythm, no murmurs, lungs CTAB, no LE edema or JVD.  Worsening renal function yesterday with IV diuresis.  Appears euvolemic on exam. Would hold diuresis for today and plan to start PO lasix 40 mg daily tomorrow.  CDonato Heinz MD

## 2020-09-10 NOTE — Progress Notes (Signed)
Patient declines nocturnal BiPAP tonight. She is aware that she may request assistance at any time if she should change her mind. She is also aware that RT may be called if she should begin to have complications and require placement in which she agrees to be re-evaluated if problems arise. Equipment remains at bedside.

## 2020-09-10 NOTE — TOC Progression Note (Addendum)
Transition of Care [facility]) - Progression Note    Patient Details  Name: Molly [name] MRN: [id number] Date of Birth: 08-Feb-1951  Transition [facility]) CM/SW Contact  Dhanush Jokerst, Juliann Pulse, RN Phone Number: 09/10/2020, 3:17 PM  Clinical Narrative: Referral for trilogy-[employer] rep Thedore Mins to follow;noted 02 sats,youth rolling walker ordered-[employer] rep Zach following-HHPT ordered-patient has no preference-[employer] rep Lattie Haw received referral-await response.Family has own transport home.   [employer] rep Lattie Haw accepted referral for HHPT.    Expected Discharge Plan: [place] Barriers to Discharge: Continued Medical Work up  Expected Discharge Plan and Services Expected Discharge Plan: [place]   Discharge Planning Services: CM Consult   Living arrangements for the past 2 months: Single Family Home                 DME Arranged: Walker rolling DME Agency: [employer] Date DME Agency Contacted: 09/07/20 Time DME Agency Contacted: 26 Representative spoke with at DME Agency: [email or web address]             Social Determinants of Health ([place]) Interventions    Readmission Risk Interventions No flowsheet data found.

## 2020-09-10 NOTE — Care Management Important Message (Signed)
Important Message  Patient Details IM Letter given to the Patient. Name: Molly [name] MRN: [id number] Date of Birth: [birth year]   Medicare Important Message Given:  Yes     Kerin Salen 09/10/2020, 3:07 PM

## 2020-09-11 DIAGNOSIS — R079 Chest pain, unspecified: Secondary | ICD-10-CM | POA: Diagnosis not present

## 2020-09-11 LAB — BASIC METABOLIC PANEL
Anion gap: 10 (ref 5–15)
BUN: 59 mg/dL — ABNORMAL HIGH (ref 8–23)
CO2: 38 mmol/L — ABNORMAL HIGH (ref 22–32)
Calcium: 8.6 mg/dL — ABNORMAL LOW (ref 8.9–10.3)
Chloride: 94 mmol/L — ABNORMAL LOW (ref 98–111)
Creatinine, Ser: 2.28 mg/dL — ABNORMAL HIGH (ref 0.44–1.00)
GFR, Estimated: 23 mL/min — ABNORMAL LOW (ref >60)
Glucose, Bld: 160 mg/dL — ABNORMAL HIGH (ref 70–99)
Potassium: 3.8 mmol/L (ref 3.5–5.1)
Sodium: 142 mmol/L (ref 135–145)

## 2020-09-11 LAB — BRAIN NATRIURETIC PEPTIDE: B Natriuretic Peptide: 110.3 pg/mL — ABNORMAL HIGH (ref 0.0–100.0)

## 2020-09-11 LAB — CBC
HCT: 32 % — ABNORMAL LOW (ref 36.0–46.0)
Hemoglobin: 9.8 g/dL — ABNORMAL LOW (ref 12.0–15.0)
MCH: 27.6 pg (ref 26.0–34.0)
MCHC: 30.6 g/dL (ref 30.0–36.0)
MCV: 90.1 fL (ref 80.0–100.0)
Platelets: 245 10*3/uL (ref 150–400)
RBC: 3.55 MIL/uL — ABNORMAL LOW (ref 3.87–5.11)
RDW: 13.8 % (ref 11.5–15.5)
WBC: 6.5 10*3/uL (ref 4.0–10.5)
nRBC: 0 % (ref 0.0–0.2)

## 2020-09-11 LAB — GLUCOSE, CAPILLARY
Glucose-Capillary: 157 mg/dL — ABNORMAL HIGH (ref 70–99)
Glucose-Capillary: 126 mg/dL — ABNORMAL HIGH (ref 70–99)
Glucose-Capillary: 149 mg/dL — ABNORMAL HIGH (ref 70–99)
Glucose-Capillary: 193 mg/dL — ABNORMAL HIGH (ref 70–99)

## 2020-09-11 LAB — PROCALCITONIN: Procalcitonin: 0.13 ng/mL

## 2020-09-11 MED ORDER — HYDRALAZINE HCL 25 MG PO TABS
25.0000 mg | ORAL_TABLET | Freq: Two times a day (BID) | ORAL | Status: DC
  Administered 2020-09-11 – 2020-09-13 (×5): 25 mg via ORAL
  Filled 2020-09-11 (×5): qty 1

## 2020-09-11 NOTE — Progress Notes (Signed)
PROGRESS NOTE  PAYCEN TECH [id number] DOB: [birth year] DOA: 08/31/2020 PCP: [name], MD  HPI/Recap of past 24 hours: Molly [name] is a 70 y.o. female with medical history significant of DM2, HTN, CKD3b, PAD. Presenting with left-sided nonradiating chest pain associated with dyspnea and nausea.  Work-up in the ED revealed hypertensive emergency with SBP in the 200s and elevated troponin.  She was started on heparin drip and cardiology was consulted.  Due to worsening renal function heart cath was postponed.  She had a VQ scan that was negative for pulmonary embolism.  Hospital course complicated by worsening AKI on CKD 3B with oliguria.  Also complicated by acute hypoxic hypercarbic respiratory failure secondary to pulmonary edema and suspected undiagnosed OSA.  Respiratory status improved with BiPAP as needed during the day and BiPAP at night.  IV diuresing started on 09/05/2020 in the setting of cardiorenal syndrome with improvement of renal function and respiratory status.  Repeated venous blood gas PCO2 after 1 night without BiPAP was elevated at 56.  Due to bump in creatinine from 2.3 to 2.7, on 09/10/2020, IV Lasix 80 mg twice daily was switched to oral Lasix 40 mg daily by cardiology.    09/11/20: Patient was seen at bedside.  She is on BiPAP.  She will need sleep study to qualify for CPAP.   Assessment/Plan: Active Problems:   Hypertensive emergency   Chest pain   Elevated troponin  Chest pain rule out ACS Trop 91, 95 Seen by cardiology, AKI precluded heart cath. She completed 48 hours of heparin drip.  She also completed 1 day of nitroglycerin drip, stopped due to hypotension.   2D echo done on 08/31/2020, the left ventricle has no regional wall motion abnormality, moderate concentric LVH Aspirin 81 mg daily, Pletal 50 mg twice daily, Zetia 10 mg daily, Crestor 40 mg daily, Coreg 25 mg twice daily. IV Lasix 80 mg twice daily added on 09/05/2020, stopped on 09/10/2020  due to bump in creatinine from 2.3 to 2.7, switched to p.o. Lasix 20 mg daily. Continue to follow renal function and electrolytes.  Hypercarbia with concern for undiagnosed OSA She will need to have a sleep study to qualify for CPAP Will need to follow-up with pulmonary for polysomnography. Curb sided with Dr. [name] who will see her in the office.  Cardiorenal syndrome/ AKI on CKD 3B  Creatinine improving from 3.13 to 2.72 to 2.45 to 2.40 To 2.35, to 2.75, to 2.3. IV Lasix discontinued on 09/10/2020, switched to oral as stated above. Continue to closely monitor urine output. Presented with creatinine of 1.38 on admission. Continue to avoid nephrotoxic agents Continue daily renal panel.  Resolved post with patient: Hypokalemia Repleted orally. Recheck with BMP this afternoon and tomorrow.  Improving severe acute hypoxic hypercarbic respiratory failure likely multifactorial secondary to pulmonary edema suspected underlying undiagnosed OSA Not on oxygen supplementation at baseline O2 saturation is improved with diuresing. She is currently on 2 L O2 saturation 100% on the monitor in the room. Hypercarbia persists when she is off the BiPAP. Outpatient sleep study Chest x-ray done on 09/10/2020 shows improvement of pulmonary edema Procalcitonin 0.13. Arterial blood gas taken on 09/05/2020 revealing pH of 7.194, PCO2 56.2, PaO2 67.5 on 3 L nasal cannula.  Repeated arterial blood gas 7.330/46.5/62.4 on 40% FiO2 on BiPAP.  VBG taken on 09/08/2020 revealed pH 7.402, PCO2 56 while off the BiPAP x1 day. Home oxygen evaluation prior to DC.  Essential hypertension, on admission hypertensive emergency. Initially treated  with nitroglycerin drip with transient hypotension which resolved off the drip.  May have contributed to AKI. She is currently on Coreg 25 mg twice daily P.o. hydralazine 25 mg twice daily added by cardiology on 09/11/2020. Continue to closely monitor vital signs  Sinus bradycardia  in the setting of beta-blocker use Heart rate in the 50s Defer management to cardiology. Continue to monitor on telemetry.  Type 2 diabetes with hyperglycemia As hemoglobin A1c 7.0 on 08/31/2020 Continue insulin sliding scale  Resolved post treatment: Hyperkalemia She has received Lokelma.   Continue renal diet, carbmodified as recommended by nephrology.  Daytime somnolence, suspect undiagnosed OSA. Will need to follow-up with pulmonary and have polysomnography Arranging follow-up appointment with Dr. [name] VBG on 09/08/2020 at 1500 p.m.  Chronic dCHF Last 2D echo 08/31/20 LVEF 50-55% G1DD Continue strict I&O Continue Daily weight Net I&O -654 cc>> -81 cc.. Management per cardiology  Anemia of chronic disease Hemoglobin uptrending No overt bleeding. Continue to monitor H&H  Hyperlipidemia/GERD/neuropathy Continue home regimen  IBS with diarrhea  stable. Continue home regimen    Code Status: Full code  Family Communication: Updated husband via phone on 09/11/2020.  Disposition Plan: Likely will discharge to home when cardiology signs of likely on 09/13/2020.  Consultants:  Cardiology.  Nephrology  Curbsided with Pulmonary on 09/05/20.  Procedures:  2D echo  Antimicrobials:  None.  DVT prophylaxis: Subcu heparin 3 times daily  Status is: Inpatient    Dispo: The patient is from: Home               Anticipated d/c is to: Home when cardiology signs off, likely on 09/13/2020.               Patient currently not stable for discharge due to ongoing management of most recent chest pain, rule out ACS.   Difficult to place patient, not applicable.         Objective: Vitals:   09/10/20 1756 09/10/20 2010 09/11/20 0458 09/11/20 0559  BP:  (!) 171/57 (!) 155/53   Pulse: 65 61 67   Resp:  18 18   Temp:  98.3 F (36.8 C) 98.6 F (37 C)   TempSrc:  Oral Oral   SpO2:  94% 96%   Weight:    70.8 kg  Height:        Intake/Output Summary (Last 24  hours) at 09/11/2020 1140 Last data filed at 09/11/2020 0557 Gross per 24 hour  Intake 483 ml  Output 150 ml  Net 333 ml   Filed Weights   09/09/20 0642 09/10/20 0437 09/11/20 0559  Weight: 71.2 kg 71 kg 70.8 kg    Exam:  . General: 70 y.o. year-old female.  Well-developed well-nourished.  No acute distress.  On BiPAP.   Marland Kitchen Cardiovascular: Regular rate and rhythm no rubs gallops. Marland Kitchen Respiratory: Clear to auscultation no wheezes or rales.  Poor inspiratory effort.   . Abdomen: Soft nondistended normal bowel sounds noted. .  Musculoskeletal: Lower extremity edema bilaterally.   . Skin: No ulcerative lesions noted.   Marland Kitchen Psychiatry: Mood is appropriate for condition and setting.   Data Reviewed: CBC: Recent Labs  Lab 09/05/20 0012 09/06/20 0224 09/07/20 0248 09/08/20 0251 09/11/20 0419  WBC 5.7 6.4 5.6 6.2 6.5  HGB 10.5* 10.4* 9.8* 10.0* 9.8*  HCT 35.6* 34.6* 31.5* 32.9* 32.0*  MCV 91.3 90.3 89.2 90.1 90.1  PLT 290 292 261 263 [id number]   Basic Metabolic Panel: Recent Labs  Lab 09/06/20 0224 09/07/20 0248  09/08/20 0251 09/09/20 0537 09/10/20 0519 09/10/20 1320 09/11/20 0419  NA 140 140 141 142 140 140 142  K 4.2 3.7 3.5 3.3* 3.4* 3.5 3.8  CL 105 105 100 96* 92* 94* 94*  CO2 22 26 32 35* 39* 38* 38*  GLUCOSE 117* 143* 178* 204* 221* 196* 160*  BUN 74* 64* 65* 61* 60* 57* 59*  CREATININE 2.72* 2.45* 2.40* 2.35* 2.75* 2.50* 2.28*  CALCIUM 8.5* 8.5* 8.6* 8.4* 8.7* 8.4* 8.6*  MG  --  2.2 2.1  --   --   --   --   PHOS 5.1* 4.5  4.4 4.1  --   --   --   --    GFR: Estimated Creatinine Clearance: 21.8 mL/min (A) (by C-G formula based on SCr of 2.28 mg/dL (H)). Liver Function Tests: Recent Labs  Lab 09/05/20 0012 09/06/20 0224 09/07/20 0248 09/08/20 0251  AST 29  --   --   --   ALT 22  --   --   --   ALKPHOS 69  --   --   --   BILITOT 0.6  --   --   --   PROT 7.1  --   --   --   ALBUMIN 3.6 3.5 3.1* 3.3*   No results for input(s): LIPASE, AMYLASE in the last 168  hours. Recent Labs  Lab 09/05/20 0015  AMMONIA 14   Coagulation Profile: No results for input(s): INR, PROTIME in the last 168 hours. Cardiac Enzymes: No results for input(s): CKTOTAL, CKMB, CKMBINDEX, TROPONINI in the last 168 hours. BNP (last 3 results) No results for input(s): PROBNP in the last 8760 hours. HbA1C: No results for input(s): HGBA1C in the last 72 hours. CBG: Recent Labs  Lab 09/10/20 1124 09/10/20 1657 09/10/20 2237 09/11/20 0733 09/11/20 1123  GLUCAP 217* 208* 139* 157* 126*   Lipid Profile: No results for input(s): CHOL, HDL, LDLCALC, TRIG, CHOLHDL, LDLDIRECT in the last 72 hours. Thyroid Function Tests: No results for input(s): TSH, T4TOTAL, FREET4, T3FREE, THYROIDAB in the last 72 hours. Anemia Panel: No results for input(s): VITAMINB12, FOLATE, FERRITIN, TIBC, IRON, RETICCTPCT in the last 72 hours. Urine analysis:    Component Value Date/Time   COLORURINE YELLOW 06/29/2015 1636   APPEARANCEUR HAZY (A) 06/29/2015 1636   LABSPEC 1.030 06/29/2015 1636   PHURINE 5.5 06/29/2015 1636   GLUCOSEU >1000 (A) 06/29/2015 1636   HGBUR NEGATIVE 06/29/2015 1636   BILIRUBINUR NEGATIVE 06/29/2015 1636   KETONESUR NEGATIVE 06/29/2015 1636   PROTEINUR NEGATIVE 06/29/2015 1636   NITRITE NEGATIVE 06/29/2015 1636   LEUKOCYTESUR NEGATIVE 06/29/2015 1636   Sepsis Labs: '@LABRCNTIP'$ (procalcitonin:4,lacticidven:4)  ) No results found for this or any previous visit (from the past 240 hour(s)).    Studies: DG CHEST PORT 1 VIEW  Result Date: 09/10/2020 CLINICAL DATA:  Hypoxia EXAM: PORTABLE CHEST 1 VIEW COMPARISON:  09/05/2020 FINDINGS: Cardiac shadow is enlarged but stable. Lungs are hypoinflated but predominantly clear. Mild left mid lung atelectatic changes and basilar atelectatic changes on the left are noted. No sizable effusion is seen. No bony abnormality is noted. IMPRESSION: Increasing atelectatic changes/infiltrate in the left mid and lower lung. Electronically  Signed   By: Inez Catalina M.D.   On: 09/10/2020 15:29    Scheduled Meds: . amLODipine  10 mg Oral QHS  . aspirin  81 mg Oral Daily  . carvedilol  25 mg Oral BID WC  . cetirizine  5 mg Oral QHS  . cholecalciferol  2,000 Units Oral Daily  . cilostazol  50 mg Oral BID  . ezetimibe  10 mg Oral Daily  . famotidine  20 mg Oral Daily  . furosemide  40 mg Oral Daily  . gabapentin  100 mg Oral TID  . heparin injection (subcutaneous)  5,000 Units Subcutaneous Q8H  . hydrALAZINE  25 mg Oral Q12H  . insulin aspart  0-5 Units Subcutaneous QHS  . insulin aspart  0-9 Units Subcutaneous TID WC  . insulin glargine  7 Units Subcutaneous Daily  . lipase/protease/amylase  72,000 Units Oral TID with meals  . mouth rinse  15 mL Mouth Rinse BID  . rosuvastatin  40 mg Oral Daily  . sodium chloride flush  3 mL Intravenous Q12H    Continuous Infusions: . sodium chloride       LOS: 11 days     Kayleen Memos, MD [facility] Pager [phone]  If 7PM-7AM, please contact night-coverage [email or web address] Password [email or web address] 09/11/2020, 11:40 AM

## 2020-09-11 NOTE — Progress Notes (Signed)
Progress Note  Patient Name: Molly [name] Date of Encounter: 09/11/2020  Primary Cardiologist: Jenne Campus, MD   Patient Profile     70 y.o. female with a PMH of PAD (CTO of left SFA and popliteal artery 08/2019), Left renal artery stenosis, HTN, HLD, DM type 2, and CKD stage 3, seen for chest pain and elevated troponins thought 2/2 demand in setting of hypertensive crisis.  CA calcification ? Cath, but deferred 2/2 decrease renal function> outpt stress test. Course complicated by Acute CHF and resp insufficiency. Echo 5/22 EF 50% mod LVH IV diuresis >>PO 5/28  Cr improving   Subjective   Denies shortness of breath.  Sleeping soundly with her CPAP.  Inpatient Medications    Scheduled Meds: . amLODipine  10 mg Oral QHS  . aspirin  81 mg Oral Daily  . carvedilol  25 mg Oral BID WC  . cetirizine  5 mg Oral QHS  . Chlorhexidine Gluconate Cloth  6 each Topical Daily  . cholecalciferol  2,000 Units Oral Daily  . cilostazol  50 mg Oral BID  . ezetimibe  10 mg Oral Daily  . famotidine  20 mg Oral Daily  . furosemide  40 mg Oral Daily  . gabapentin  100 mg Oral TID  . heparin injection (subcutaneous)  5,000 Units Subcutaneous Q8H  . insulin aspart  0-5 Units Subcutaneous QHS  . insulin aspart  0-9 Units Subcutaneous TID WC  . insulin glargine  7 Units Subcutaneous Daily  . lipase/protease/amylase  72,000 Units Oral TID with meals  . mouth rinse  15 mL Mouth Rinse BID  . rosuvastatin  40 mg Oral Daily  . sodium chloride flush  3 mL Intravenous Q12H   Continuous Infusions: . sodium chloride     PRN Meds: sodium chloride, acetaminophen, ALPRAZolam, diphenhydrAMINE, hydrALAZINE, ipratropium-albuterol, loperamide, nitroGLYCERIN, ondansetron (ZOFRAN) IV, sodium chloride flush   Vital Signs    Vitals:   09/10/20 1756 09/10/20 2010 09/11/20 0458 09/11/20 0559  BP:  (!) 171/57 (!) 155/53   Pulse: 65 61 67   Resp:  18 18   Temp:  98.3 F (36.8 C) 98.6 F (37 C)    TempSrc:  Oral Oral   SpO2:  94% 96%   Weight:    70.8 kg  Height:        Intake/Output Summary (Last 24 hours) at 09/11/2020 0814 Last data filed at 09/11/2020 0557 Gross per 24 hour  Intake 723 ml  Output 800 ml  Net -77 ml   Filed Weights   09/09/20 0642 09/10/20 0437 09/11/20 0559  Weight: 71.2 kg 71 kg 70.8 kg    Physical Exam   Well developed and nourished in no acute distress HENT normal Neck supple with JVP-6-8 cm Clear Regular rate and rhythm, no murmurs or gallops Abd-soft with active BS No Clubbing cyanosis edema Skin-warm and dry A & Oriented  Grossly normal sensory and motor function     Labs    Chemistry Recent Labs  Lab 09/05/20 0012 09/06/20 0224 09/07/20 0248 09/08/20 0251 09/09/20 0537 09/10/20 0519 09/10/20 1320 09/11/20 0419  NA 138 140 140 141   < > 140 140 142  K 5.0 4.2 3.7 3.5   < > 3.4* 3.5 3.8  CL 107 105 105 100   < > 92* 94* 94*  CO2 '22 22 26 '$ 32   < > 39* 38* 38*  GLUCOSE 136* 117* 143* 178*   < > 221* 196* 160*  BUN 72* 74* 64* 65*   < > 60* 57* 59*  CREATININE 3.13* 2.72* 2.45* 2.40*   < > 2.75* 2.50* 2.28*  CALCIUM 8.4* 8.5* 8.5* 8.6*   < > 8.7* 8.4* 8.6*  PROT 7.1  --   --   --   --   --   --   --   ALBUMIN 3.6 3.5 3.1* 3.3*  --   --   --   --   AST 29  --   --   --   --   --   --   --   ALT 22  --   --   --   --   --   --   --   ALKPHOS 69  --   --   --   --   --   --   --   BILITOT 0.6  --   --   --   --   --   --   --   GFRNONAA 16* 18* 21* 21*   < > 18* 20* 23*  ANIONGAP '9 13 9 9   '$ < > '9 8 10   '$ < > = values in this interval not displayed.     Hematology Recent Labs  Lab 09/07/20 0248 09/08/20 0251 09/11/20 0419  WBC 5.6 6.2 6.5  RBC 3.53* 3.65* 3.55*  HGB 9.8* 10.0* 9.8*  HCT 31.5* 32.9* 32.0*  MCV 89.2 90.1 90.1  MCH 27.8 27.4 27.6  MCHC 31.1 30.4 30.6  RDW 14.6 14.6 13.8  PLT 261 263 245    Cardiac EnzymesNo results for input(s): TROPONINI in the last 168 hours. No results for input(s): TROPIPOC in  the last 168 hours.   BNP Recent Labs  Lab 09/05/20 0619  BNP 1,047.3*     DDimer No results for input(s): DDIMER in the last 168 hours.   Radiology    DG CHEST PORT 1 VIEW  Result Date: 09/10/2020 CLINICAL DATA:  Hypoxia EXAM: PORTABLE CHEST 1 VIEW COMPARISON:  09/05/2020 FINDINGS: Cardiac shadow is enlarged but stable. Lungs are hypoinflated but predominantly clear. Mild left mid lung atelectatic changes and basilar atelectatic changes on the left are noted. No sizable effusion is seen. No bony abnormality is noted. IMPRESSION: Increasing atelectatic changes/infiltrate in the left mid and lower lung. Electronically Signed   By: Inez Catalina M.D.   On: 09/10/2020 15:29    Telemetry    09/10/20 NSR- Personally Reviewed  ECG    No new tracing as of 09/10/20- Personally Reviewed  Cardiac Studies   Echocardiogram 08/31/20: 1. Left ventricular ejection fraction, by estimation, is 50 to 55%. Left  ventricular ejection fraction by 3D volume is 51 %. The left ventricle has  low normal function. The left ventricle has no regional wall motion  abnormalities. There is moderate  concentric left ventricular hypertrophy. Left ventricular diastolic  parameters are consistent with Grade I diastolic dysfunction (impaired  relaxation).  2. Right ventricular systolic function is normal. The right ventricular  size is normal.  3. The pericardial effusion is circumferential. There is no evidence of  cardiac tamponade.  4. The mitral valve is grossly normal. Trivial mitral valve  regurgitation. No evidence of mitral stenosis.  5. The aortic valve is tricuspid. There is mild calcification of the  aortic valve. There is mild thickening of the aortic valve. Aortic valve  regurgitation is not visualized. No aortic stenosis is present.  6. The inferior vena cava is  normal in size with greater than 50%  respiratory variability, suggesting right atrial pressure of 3 mmHg.   Comparison(s): A  prior study was performed on 11/11/2019. No significant  change from prior study. Prior images reviewed side by side.   Renal artery [place] 09/03/20: Right: Upper range 1-59% stenosis of the right renal artery by     velocities of visualized segments. RRV flow present.  Left: No evidence of hemodynamically significant left renal artery     stenosis. LRV flow present. Abnormal size for the left     kidney.   Abdominal aortogram 08/28/2019 -90% proximal left renal artery stenosis -70% proximal left SFA, 100% mid left SFA -100% left popliteal occlusion -100% mid right SFA    Assessment & Plan    Chest pain with elevated tropinin with CA Calcification; known PVD  Hypertensive crisis with known L Renal artery stenosis  Renal insufficiency acute/chronic gd4  CHF acute/chronic diastolic    BP remains elevated-- consider low dose hydralazine 25 bid  Continue diuretics po  Will need outpt stress test  I and others have previously discussed stress testing with patient to elucidate the potential contribution of CAD to cardiomyopathy            Virl Axe, MD

## 2020-09-11 NOTE — Progress Notes (Signed)
Attempted to ambulate patient for Sats but she was very drowsy and fatigued

## 2020-09-12 ENCOUNTER — Other Ambulatory Visit: Payer: Self-pay | Admitting: Physician Assistant

## 2020-09-12 DIAGNOSIS — I161 Hypertensive emergency: Secondary | ICD-10-CM | POA: Diagnosis not present

## 2020-09-12 DIAGNOSIS — R079 Chest pain, unspecified: Secondary | ICD-10-CM | POA: Diagnosis not present

## 2020-09-12 DIAGNOSIS — E860 Dehydration: Secondary | ICD-10-CM

## 2020-09-12 DIAGNOSIS — I10 Essential (primary) hypertension: Secondary | ICD-10-CM

## 2020-09-12 DIAGNOSIS — E871 Hypo-osmolality and hyponatremia: Secondary | ICD-10-CM

## 2020-09-12 DIAGNOSIS — R072 Precordial pain: Secondary | ICD-10-CM

## 2020-09-12 DIAGNOSIS — N179 Acute kidney failure, unspecified: Secondary | ICD-10-CM | POA: Diagnosis not present

## 2020-09-12 LAB — GLUCOSE, CAPILLARY
Glucose-Capillary: 179 mg/dL — ABNORMAL HIGH (ref 70–99)
Glucose-Capillary: 240 mg/dL — ABNORMAL HIGH (ref 70–99)
Glucose-Capillary: 200 mg/dL — ABNORMAL HIGH (ref 70–99)
Glucose-Capillary: 203 mg/dL — ABNORMAL HIGH (ref 70–99)

## 2020-09-12 MED ORDER — ENSURE ENLIVE PO LIQD
237.0000 mL | ORAL | Status: DC
  Administered 2020-09-12 – 2020-09-13 (×2): 237 mL via ORAL

## 2020-09-12 MED ORDER — ADULT MULTIVITAMIN W/MINERALS CH
1.0000 | ORAL_TABLET | Freq: Every day | ORAL | Status: DC
  Administered 2020-09-12 – 2020-09-13 (×2): 1 via ORAL
  Filled 2020-09-12 (×2): qty 1

## 2020-09-12 NOTE — Progress Notes (Signed)
PROGRESS NOTE    Molly [name]  [id number] DOB: 05/25/1950 DOA: 08/31/2020 PCP: [name], MD    Brief Narrative:  Mrs. Edholm was admitted to the hospital with the working diagnosis of chest pain to rule out acute coronary syndrome.   70 yo female with the past medical history of T2DM, HTN, Ckd stage 3b, and peripheral vascular disease who presented with chest pain. 6/10 pain, intermittent, precordial associated with dyspnea. On her initial physical examination her blood pressure was 191/71, HR 79, RR25, oxygen saturation 69 to 84%, lungs with no wheezing or rhonchi, heart S1 -S2 present and rhythmic, abdomen soft and non tender, no lower extremity edema.   Na 139, K 4,5, Cl 104, bicarbonate 26, glucose 164, BUN 32 and cr at 1,38. Troponin 91-95, wbc 8,9, hgb 12.4, hct 40,2 and platelets 250.   Chest radiograph with cardiomegaly, congested hilar vasculature and left lower lobe faint infiltrate.   Patient was placed on heparin drip.   Developed pulmonary edema and required non invasive mechanical ventilation.  Received diuresis with improvement of her symptoms.     Assessment & Plan:   Principal Problem:   Hypertensive emergency Active Problems:   HTN (hypertension)   AKI (acute kidney injury) ([place])   Dehydration with hyponatremia   Dyslipidemia   Type 2 diabetes mellitus with stage 3b chronic kidney disease, with long-term current use of insulin (HCC)   Chest pain   Elevated troponin   1. Uncontrolled HTN, hypertensive emergency, acute on chronic diastolic heart failure. Acute hypoxemic and hypercapnic respiratory failure acute pulmonary edema.  Blood pressure 135/52 mmHg this am, lung with no wheezing or rales on auscultation.  Not documented urine output over last 24 hrs.   Improved volume status.  Now off Bipap with good toleration.  Blood pressure control with amlodipine, hydralazine and carvedilol.  Furosemide 40 mg po daily.   2. AKI on CKD stage 3b/  hypokalemia.  Renal function on 05/28 with serum cr at 2,28 with K at 3,8 and serum bicarbonate at 38. Will follow up renal panel in am, avoid hypotension and nephrotoxic medications.   3. T2DM/ dyslipidemia. With hyperglycemia. Fasting glucose better at 160 this am. Continue glucose cover and monitoring with insulin sliding scale.  Basal insulin 7 units.   Continue with rosuvastatin.   4. Anemia of chronic disease. Close follow up of Hgb and hct.    Status is: Inpatient  Remains inpatient appropriate because:Inpatient level of care appropriate due to severity of illness   Dispo:  Patient From: Home  Planned Disposition: Home with Health Care Svc  Medically stable for discharge: No         DVT prophylaxis: Heparin   Code Status:   full  Family Communication:  I spoke with patient's husband at the bedside, we talked in detail about patient's condition, plan of care and prognosis and all questions were addressed.      Nutrition Status: Nutrition Problem: Increased nutrient needs Etiology: acute illness Signs/Symptoms: estimated needs Interventions: Refer to RD note for recommendations     Skin Documentation:     Consultants:   Cardiology   Nephrology    Subjective: Patient is feeling better, no nausea or vomiting, no chest pain. Continue to have dyspnea on exertion, not yet back to baseline.   Objective: Vitals:   09/11/20 2124 09/11/20 2200 09/12/20 0518 09/12/20 0610  BP: (!) 139/53  (!) 135/52   Pulse: (!) 59  60   Resp: (!) 22  20   Temp: 98.2 F (36.8 C)  (!) 97.4 F (36.3 C)   TempSrc: Oral  Oral   SpO2: 91% 96% 96%   Weight:    71.6 kg  Height:        Intake/Output Summary (Last 24 hours) at 09/12/2020 1345 Last data filed at 09/12/2020 0800 Gross per 24 hour  Intake 123 ml  Output 300 ml  Net -177 ml   Filed Weights   09/10/20 0437 09/11/20 0559 09/12/20 0610  Weight: 71 kg 70.8 kg 71.6 kg    Examination:   General: Not in pain  or dyspnea. Deconditioned  Neurology: Awake and alert, non focal  E ENT: no pallor, no icterus, oral mucosa moist Cardiovascular: No JVD. S1-S2 present, rhythmic, no gallops, rubs, or murmurs. No lower extremity edema. Pulmonary: positive breath sounds bilaterally, adequate air movement, no wheezing, rhonchi or rales. Gastrointestinal. Abdomen soft and non tender Skin. No rashes Musculoskeletal: no joint deformities     Data Reviewed: I have personally reviewed following labs and imaging studies  CBC: Recent Labs  Lab 09/06/20 0224 09/07/20 0248 09/08/20 0251 09/11/20 0419  WBC 6.4 5.6 6.2 6.5  HGB 10.4* 9.8* 10.0* 9.8*  HCT 34.6* 31.5* 32.9* 32.0*  MCV 90.3 89.2 90.1 90.1  PLT 292 261 263 [id number]   Basic Metabolic Panel: Recent Labs  Lab 09/06/20 0224 09/07/20 0248 09/08/20 0251 09/09/20 0537 09/10/20 0519 09/10/20 1320 09/11/20 0419  NA 140 140 141 142 140 140 142  K 4.2 3.7 3.5 3.3* 3.4* 3.5 3.8  CL 105 105 100 96* 92* 94* 94*  CO2 22 26 32 35* 39* 38* 38*  GLUCOSE 117* 143* 178* 204* 221* 196* 160*  BUN 74* 64* 65* 61* 60* 57* 59*  CREATININE 2.72* 2.45* 2.40* 2.35* 2.75* 2.50* 2.28*  CALCIUM 8.5* 8.5* 8.6* 8.4* 8.7* 8.4* 8.6*  MG  --  2.2 2.1  --   --   --   --   PHOS 5.1* 4.5  4.4 4.1  --   --   --   --    GFR: Estimated Creatinine Clearance: 21.9 mL/min (A) (by C-G formula based on SCr of 2.28 mg/dL (H)). Liver Function Tests: Recent Labs  Lab 09/06/20 0224 09/07/20 0248 09/08/20 0251  ALBUMIN 3.5 3.1* 3.3*   No results for input(s): LIPASE, AMYLASE in the last 168 hours. No results for input(s): AMMONIA in the last 168 hours. Coagulation Profile: No results for input(s): INR, PROTIME in the last 168 hours. Cardiac Enzymes: No results for input(s): CKTOTAL, CKMB, CKMBINDEX, TROPONINI in the last 168 hours. BNP (last 3 results) No results for input(s): PROBNP in the last 8760 hours. HbA1C: No results for input(s): HGBA1C in the last 72  hours. CBG: Recent Labs  Lab 09/11/20 1123 09/11/20 1653 09/11/20 2124 09/12/20 0747 09/12/20 1315  GLUCAP 126* 149* 193* 179* 240*   Lipid Profile: No results for input(s): CHOL, HDL, LDLCALC, TRIG, CHOLHDL, LDLDIRECT in the last 72 hours. Thyroid Function Tests: No results for input(s): TSH, T4TOTAL, FREET4, T3FREE, THYROIDAB in the last 72 hours. Anemia Panel: No results for input(s): VITAMINB12, FOLATE, FERRITIN, TIBC, IRON, RETICCTPCT in the last 72 hours.    Radiology Studies: I have reviewed all of the imaging during this hospital visit personally     Scheduled Meds: . amLODipine  10 mg Oral QHS  . aspirin  81 mg Oral Daily  . carvedilol  25 mg Oral BID WC  . cetirizine  5 mg Oral  QHS  . cholecalciferol  2,000 Units Oral Daily  . cilostazol  50 mg Oral BID  . ezetimibe  10 mg Oral Daily  . famotidine  20 mg Oral Daily  . feeding supplement  237 mL Oral Q24H  . furosemide  40 mg Oral Daily  . gabapentin  100 mg Oral TID  . heparin injection (subcutaneous)  5,000 Units Subcutaneous Q8H  . hydrALAZINE  25 mg Oral Q12H  . insulin aspart  0-5 Units Subcutaneous QHS  . insulin aspart  0-9 Units Subcutaneous TID WC  . insulin glargine  7 Units Subcutaneous Daily  . lipase/protease/amylase  72,000 Units Oral TID with meals  . mouth rinse  15 mL Mouth Rinse BID  . multivitamin with minerals  1 tablet Oral Daily  . rosuvastatin  40 mg Oral Daily  . sodium chloride flush  3 mL Intravenous Q12H   Continuous Infusions: . sodium chloride       LOS: 12 days        Ariela Mochizuki Gerome Apley, MD

## 2020-09-12 NOTE — Progress Notes (Signed)
Initial Nutrition Assessment  DOCUMENTATION CODES:   Not applicable  INTERVENTION:   Liberalize diet to carb modified with 1200 ml fluid restriction    Ensure Enlive po once daily, each supplement provides 350 kcal and 20 grams of protein  MVI daily   NUTRITION DIAGNOSIS:   Increased nutrient needs related to acute illness as evidenced by estimated needs.  GOAL:   Patient will meet greater than or equal to 90% of their needs  MONITOR:   PO intake,Supplement acceptance,Weight trends,Labs,I & O's  REASON FOR ASSESSMENT:   Consult Assessment of nutrition requirement/status  ASSESSMENT:   Patient with PMH significant for DM, HTN, CKD III, CHF, HLD, GERD, IBS-D, and PAD. Presents this admission with respiratory failure secondary to undiagnosed OSA.   Patient reports intake over the last few months decreased due to ongoing loose stools after meals. She consumed one meal daily that consisted of restaurant foods or protein, grain, vegetable prepared on her grill. She was seen by GI outpatient and they recommended Creon 72,000 units per meal. She attempted to take this regimen but noticed it caused constipation, so she stopped. Intake this admission has been fair. Last four meal completions charted as 80%, 0%, 100%, and 50%. Discussed the importance of protein intake for preservation of lean body mass. Patient to try Ensure.   Patient denies recent weight loss from her UBW of 160 lb. Records indicate patient has maintained her weight over the last year.   Medications: 40 mg lasix, SS novolog, lantus, creon Labs: CBG 126-193  Diet Order:   Diet Order            Diet renal/carb modified with fluid restriction Diet-HS Snack? Nothing; Fluid restriction: 1200 mL Fluid; Room service appropriate? Yes; Fluid consistency: Thin  Diet effective now                 EDUCATION NEEDS:   Education needs have been addressed  Skin:  Skin Assessment: Reviewed RN Assessment  Last BM:   5/23  Height:   Ht Readings from Last 1 Encounters:  08/31/20 '5\' 2"'$  (1.575 m)    Weight:   Wt Readings from Last 1 Encounters:  09/12/20 71.6 kg    BMI:  Body mass index is 28.88 kg/m.  Estimated Nutritional Needs:   Kcal:  1700-1900 kcal  Protein:  105-115 grams  Fluid:  1.2 L fluid restriction  Mariana Single RD, LDN Clinical Nutrition Pager listed in [place]

## 2020-09-12 NOTE — Progress Notes (Signed)
Physical Therapy Treatment Patient Details Name: Molly [name] MRN: [id number] DOB: [birth year] Today's Date: 09/12/2020    History of Present Illness 70 y.o. female presented with left-sided nonradiating chest pain associated with dyspnea and nausea.  Work-up in the ED revealed hypertensive emergency with SBP in the 200s and elevated troponin. Dx of possible NSTEMI, AKI. Pt with medical history significant of DM2, HTN, CKD3b, PAD.    PT Comments    Pt in good spirits and up to ambulate in hall but distance ltd - pt needed in room for important phone call.     Follow Up Recommendations  Home health PT;Supervision for mobility/OOB     Equipment Recommendations  Rolling walker with 5" wheels    Recommendations for Other Services       Precautions / Restrictions Precautions Precautions: Fall Precaution Comments: monitor O2 Restrictions Weight Bearing Restrictions: No    Mobility  Bed Mobility               General bed mobility comments: up in chair and returns to same    Transfers Overall transfer level: Needs assistance Equipment used: Rolling walker (2 wheeled) Transfers: Sit to/from Stand Sit to Stand: Min guard         General transfer comment: steady assist with cues for use of UEs to self assist  Ambulation/Gait Ambulation/Gait assistance: Min guard Gait Distance (Feet): 48 Feet Assistive device: Rolling walker (2 wheeled) Gait Pattern/deviations: Step-through pattern;Decreased stride length Gait velocity: decr   General Gait Details: cues for posture and position from RW; distance ltd by pt needed in room for important phone call.   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      Exercises      General Comments        Pertinent Vitals/Pain Pain Assessment: No/denies pain    Home Living Family/patient expects to be discharged to:: Private residence                    Prior Function            PT Goals (current goals can now be found in the care plan section) Acute Rehab PT Goals PT Goal Formulation: With patient/family Time For Goal Achievement: 09/17/20 Potential to Achieve Goals: Good Progress towards PT goals: Progressing toward goals    Frequency    Min 3X/week      PT Plan Current plan remains appropriate    Co-evaluation              AM-PAC PT "6 Clicks" Mobility   Outcome Measure  Help needed turning from your back to your side while in a flat  bed without using bedrails?: A Little Help needed moving from lying on your back to sitting on the side of a flat bed without using bedrails?: A Little Help needed moving to and from a bed to a chair (including a wheelchair)?: A Little Help needed standing up from a chair using your arms (e.g., wheelchair or bedside chair)?: A Little Help needed to walk in hospital room?: A Little Help needed climbing 3-5 steps with a railing? : A Lot 6 Click Score: 17    End of Session Equipment Utilized During Treatment: Gait belt;Oxygen Activity Tolerance: Patient tolerated treatment well Patient left: in chair;with call bell/phone within reach;with family/visitor present Nurse Communication: Mobility status PT Visit Diagnosis: Difficulty in walking, not elsewhere classified (R26.2)     Time: 1040-1057 PT Time Calculation (min) (ACUTE ONLY): 17 min  Charges:  $Gait Training: 8-22 mins                      [place] Pager [phone] Office [phone]    [place] 09/12/2020, 1:42 PM

## 2020-09-12 NOTE — Progress Notes (Signed)
Patient stated that she did not want to wear Cpap tonight. Encouraged patient to call if she changes her mind. Patient is on 2L nasal canula.

## 2020-09-12 NOTE — Progress Notes (Signed)
SATURATION QUALIFICATIONS: (This note is used to comply with regulatory documentation for home oxygen)  Patient Saturations on Room Air at Rest = 91%  Patient Saturations on Room Air while Ambulating = 77% Pattient Saturations on 2 Liters of oxygen while Ambulating = 97%   Please briefly explain why patient needs home oxygen:to maintain O2 sats . 88%

## 2020-09-12 NOTE — Progress Notes (Signed)
Physical Therapy Treatment Patient Details Name: Molly [name] MRN: [id number] DOB: 12-Sep-1950 Today's Date: 09/12/2020    History of Present Illness 70 y.o. female presented with left-sided nonradiating chest pain associated with dyspnea and nausea.  Work-up in the ED revealed hypertensive emergency with SBP in the 200s and elevated troponin. Dx of possible NSTEMI, AKI. Pt with medical history significant of DM2, HTN, CKD3b, PAD.    PT Comments    Pt very motivated and eager to attempt ambulation.  Pt up to walk increased distance in hall with RW and O2.  Pt states pleased with increased distance but fatigued.   Follow Up Recommendations  Home health PT;Supervision for mobility/OOB     Equipment Recommendations  Rolling walker with 5" wheels    Recommendations for Other Services       Precautions / Restrictions Precautions Precautions: Fall Precaution Comments: monitor O2 Restrictions Weight Bearing Restrictions: No    Mobility  Bed Mobility               General bed mobility comments: up in chair and returns to same    Transfers Overall transfer level: Needs assistance Equipment used: Rolling walker (2 wheeled) Transfers: Sit to/from Stand Sit to Stand: Min guard         General transfer comment: steady assist with cues for use of UEs to self assist  Ambulation/Gait Ambulation/Gait assistance: Min guard Gait Distance (Feet): 140 Feet Assistive device: Rolling walker (2 wheeled) Gait Pattern/deviations: Decreased stride length;Shuffle;Trunk flexed;Step-through pattern Gait velocity: decr   General Gait Details: cues for posture and position from RW; distance ltd by pt needed in room for important phone call.   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      Exercises      General Comments        Pertinent Vitals/Pain Pain Assessment: No/denies pain    Home Living Family/patient expects to be discharged to:: Private residence                    Prior Function            PT Goals (current goals can now be found in the care plan section) Acute Rehab PT Goals Patient Stated Goal: to go home and sleep in her bed PT Goal Formulation: With patient/family Time For Goal Achievement: 09/17/20 Potential to Achieve Goals: Good Progress towards PT goals: Progressing toward goals    Frequency    Min 3X/week      PT Plan Current plan remains appropriate    Co-evaluation              AM-PAC PT "6 Clicks" Mobility  Outcome Measure  Help needed turning from your back to your side while in a flat bed without using bedrails?: A Little Help needed moving from lying on your back to sitting on the side of a flat bed without using bedrails?: A Little Help needed moving to and from a bed to a chair (including a wheelchair)?: A Little Help needed standing up from a chair using your arms (e.g., wheelchair or bedside chair)?: A Little Help needed to walk in hospital room?: A Little Help needed climbing 3-5 steps with a railing? : A Lot 6 Click Score: 17    End of Session Equipment Utilized During Treatment: Gait belt;Oxygen Activity Tolerance: Patient tolerated treatment well Patient left: in chair;with call bell/phone within reach;with family/visitor present Nurse Communication: Mobility status PT Visit Diagnosis: Difficulty in walking, not elsewhere  classified (R26.2)     Time: [id number] PT Time Calculation (min) (ACUTE ONLY): 14 min  Charges:  $Gait Training: 8-22 mins                     [place] Pager [phone] Office [phone]    [place] 09/12/2020, 1:46 PM

## 2020-09-12 NOTE — Plan of Care (Signed)
  Problem: Clinical Measurements: Goal: Respiratory complications will improve Outcome: Progressing   Problem: Activity: Goal: Risk for activity intolerance will decrease Outcome: Progressing   

## 2020-09-12 NOTE — Progress Notes (Signed)
Progress Note  Patient Name: Molly [name] Date of Encounter: 09/12/2020  Primary Cardiologist: Jenne Campus, MD   Patient Profile     70 y.o. female with a PMH of PAD (CTO of left SFA and popliteal artery 08/2019), Left renal artery stenosis, HTN, HLD, DM type 2, and CKD stage 3, seen for chest pain and elevated troponins thought 2/2 demand in setting of hypertensive crisis.  CA calcification ? Cath, but deferred 2/2 decrease renal function> outpt stress test. Course complicated by Acute CHF and resp insufficiency. Echo 5/22 EF 50% mod LVH IV diuresis >>PO 5/28  Cr improving   Subjective  Continues to feel better Lots of questions as to what happened  Has outpt nephrology appt 6/17 [facility]  Inpatient Medications    Scheduled Meds: . amLODipine  10 mg Oral QHS  . aspirin  81 mg Oral Daily  . carvedilol  25 mg Oral BID WC  . cetirizine  5 mg Oral QHS  . cholecalciferol  2,000 Units Oral Daily  . cilostazol  50 mg Oral BID  . ezetimibe  10 mg Oral Daily  . famotidine  20 mg Oral Daily  . furosemide  40 mg Oral Daily  . gabapentin  100 mg Oral TID  . heparin injection (subcutaneous)  5,000 Units Subcutaneous Q8H  . hydrALAZINE  25 mg Oral Q12H  . insulin aspart  0-5 Units Subcutaneous QHS  . insulin aspart  0-9 Units Subcutaneous TID WC  . insulin glargine  7 Units Subcutaneous Daily  . lipase/protease/amylase  72,000 Units Oral TID with meals  . mouth rinse  15 mL Mouth Rinse BID  . rosuvastatin  40 mg Oral Daily  . sodium chloride flush  3 mL Intravenous Q12H   Continuous Infusions: . sodium chloride     PRN Meds: sodium chloride, acetaminophen, ALPRAZolam, diphenhydrAMINE, hydrALAZINE, ipratropium-albuterol, loperamide, nitroGLYCERIN, ondansetron (ZOFRAN) IV, sodium chloride flush   Vital Signs    Vitals:   09/11/20 2124 09/11/20 2200 09/12/20 0518 09/12/20 0610  BP: (!) 139/53  (!) 135/52   Pulse: (!) 59  60   Resp: (!) 22  20   Temp: 98.2 F (36.8 C)   (!) 97.4 F (36.3 C)   TempSrc: Oral  Oral   SpO2: 91% 96% 96%   Weight:    71.6 kg  Height:        Intake/Output Summary (Last 24 hours) at 09/12/2020 0814 Last data filed at 09/11/2020 2206 Gross per 24 hour  Intake 123 ml  Output --  Net 123 ml   Filed Weights   09/10/20 0437 09/11/20 0559 09/12/20 0610  Weight: 71 kg 70.8 kg 71.6 kg    Physical Exam  Well developed and nourished in no acute distress HENT normal Neck supple with JVP-  flat   Clear Regular rate and rhythm, no murmurs or gallops Abd-soft with active BS No Clubbing cyanosis edema Skin-warm and dry A & Oriented  Grossly normal sensory and motor function      Labs    Chemistry Recent Labs  Lab 09/06/20 0224 09/07/20 0248 09/08/20 0251 09/09/20 0537 09/10/20 0519 09/10/20 1320 09/11/20 0419  NA 140 140 141   < > 140 140 142  K 4.2 3.7 3.5   < > 3.4* 3.5 3.8  CL 105 105 100   < > 92* 94* 94*  CO2 22 26 32   < > 39* 38* 38*  GLUCOSE 117* 143* 178*   < >  221* 196* 160*  BUN 74* 64* 65*   < > 60* 57* 59*  CREATININE 2.72* 2.45* 2.40*   < > 2.75* 2.50* 2.28*  CALCIUM 8.5* 8.5* 8.6*   < > 8.7* 8.4* 8.6*  ALBUMIN 3.5 3.1* 3.3*  --   --   --   --   GFRNONAA 18* 21* 21*   < > 18* 20* 23*  ANIONGAP '13 9 9   '$ < > '9 8 10   '$ < > = values in this interval not displayed.     Hematology Recent Labs  Lab 09/07/20 0248 09/08/20 0251 09/11/20 0419  WBC 5.6 6.2 6.5  RBC 3.53* 3.65* 3.55*  HGB 9.8* 10.0* 9.8*  HCT 31.5* 32.9* 32.0*  MCV 89.2 90.1 90.1  MCH 27.8 27.4 27.6  MCHC 31.1 30.4 30.6  RDW 14.6 14.6 13.8  PLT 261 263 245    Cardiac EnzymesNo results for input(s): TROPONINI in the last 168 hours. No results for input(s): TROPIPOC in the last 168 hours.   BNP Recent Labs  Lab 09/11/20 1117  BNP 110.3*     DDimer No results for input(s): DDIMER in the last 168 hours.   Radiology    DG CHEST PORT 1 VIEW  Result Date: 09/10/2020 CLINICAL DATA:  Hypoxia EXAM: PORTABLE CHEST 1 VIEW  COMPARISON:  09/05/2020 FINDINGS: Cardiac shadow is enlarged but stable. Lungs are hypoinflated but predominantly clear. Mild left mid lung atelectatic changes and basilar atelectatic changes on the left are noted. No sizable effusion is seen. No bony abnormality is noted. IMPRESSION: Increasing atelectatic changes/infiltrate in the left mid and lower lung. Electronically Signed   By: Inez Catalina M.D.   On: 09/10/2020 15:29    Telemetry    09/10/20 NSR- Personally Reviewed  ECG    No new tracing as of 09/10/20- Personally Reviewed  Cardiac Studies   Echocardiogram 08/31/20: 1. Left ventricular ejection fraction, by estimation, is 50 to 55%. Left  ventricular ejection fraction by 3D volume is 51 %. The left ventricle has  low normal function. The left ventricle has no regional wall motion  abnormalities. There is moderate  concentric left ventricular hypertrophy. Left ventricular diastolic  parameters are consistent with Grade I diastolic dysfunction (impaired  relaxation).  2. Right ventricular systolic function is normal. The right ventricular  size is normal.  3. The pericardial effusion is circumferential. There is no evidence of  cardiac tamponade.  4. The mitral valve is grossly normal. Trivial mitral valve  regurgitation. No evidence of mitral stenosis.  5. The aortic valve is tricuspid. There is mild calcification of the  aortic valve. There is mild thickening of the aortic valve. Aortic valve  regurgitation is not visualized. No aortic stenosis is present.  6. The inferior vena cava is normal in size with greater than 50%  respiratory variability, suggesting right atrial pressure of 3 mmHg.   Comparison(s): A prior study was performed on 11/11/2019. No significant  change from prior study. Prior images reviewed side by side.   Renal artery [place] 09/03/20: Right: Upper range 1-59% stenosis of the right renal artery by     velocities of visualized segments. RRV flow  present.  Left: No evidence of hemodynamically significant left renal artery     stenosis. LRV flow present. Abnormal size for the left     kidney.   Abdominal aortogram 08/28/2019 -90% proximal left renal artery stenosis -70% proximal left SFA, 100% mid left SFA -100% left popliteal occlusion -100% mid  right SFA    Assessment & Plan    Chest pain with elevated tropinin with CA Calcification; known PVD  Hypertensive crisis with known L Renal artery stenosis  Renal insufficiency acute/chronic gd4  CHF acute/chronic diastolic  BP better, renal function better  Will need outpt stress   Ok from our perspective to go home  Nephrology appt setup             Virl Axe, MD

## 2020-09-12 NOTE — Progress Notes (Signed)
Stress test per Dr. Caryl Comes

## 2020-09-12 NOTE — Progress Notes (Addendum)
Dr. Caryl Comes recommended outpatient Lexiscan stress test and follow-up visit. I called into pt room to discuss stress test/consent. Orders written.  Shared Decision Making/Informed Consent The risks [chest pain, shortness of breath, cardiac arrhythmias, dizziness, blood pressure fluctuations, myocardial infarction, stroke/transient ischemic attack, nausea, vomiting, allergic reaction, radiation exposure, metallic taste sensation and life-threatening complications (estimated to be 1 in 10,000)], benefits (risk stratification, diagnosing coronary artery disease, treatment guidance) and alternatives of a nuclear stress test were discussed in detail with Molly [name] and she agrees to proceed.  Will put instructions on After-Visit Summary as well. Message sent to office to help arrange.

## 2020-09-13 DIAGNOSIS — R079 Chest pain, unspecified: Secondary | ICD-10-CM | POA: Diagnosis not present

## 2020-09-13 DIAGNOSIS — I161 Hypertensive emergency: Secondary | ICD-10-CM | POA: Diagnosis not present

## 2020-09-13 DIAGNOSIS — Z794 Long term (current) use of insulin: Secondary | ICD-10-CM

## 2020-09-13 DIAGNOSIS — N179 Acute kidney failure, unspecified: Secondary | ICD-10-CM | POA: Diagnosis not present

## 2020-09-13 DIAGNOSIS — E785 Hyperlipidemia, unspecified: Secondary | ICD-10-CM | POA: Diagnosis not present

## 2020-09-13 DIAGNOSIS — E1122 Type 2 diabetes mellitus with diabetic chronic kidney disease: Secondary | ICD-10-CM

## 2020-09-13 DIAGNOSIS — N1832 Chronic kidney disease, stage 3b: Secondary | ICD-10-CM

## 2020-09-13 LAB — BASIC METABOLIC PANEL
Anion gap: 8 (ref 5–15)
BUN: 58 mg/dL — ABNORMAL HIGH (ref 8–23)
CO2: 36 mmol/L — ABNORMAL HIGH (ref 22–32)
Calcium: 8.6 mg/dL — ABNORMAL LOW (ref 8.9–10.3)
Chloride: 96 mmol/L — ABNORMAL LOW (ref 98–111)
Creatinine, Ser: 2.44 mg/dL — ABNORMAL HIGH (ref 0.44–1.00)
GFR, Estimated: 21 mL/min — ABNORMAL LOW (ref >60)
Glucose, Bld: 230 mg/dL — ABNORMAL HIGH (ref 70–99)
Potassium: 3.6 mmol/L (ref 3.5–5.1)
Sodium: 140 mmol/L (ref 135–145)

## 2020-09-13 LAB — GLUCOSE, CAPILLARY
Glucose-Capillary: 222 mg/dL — ABNORMAL HIGH (ref 70–99)
Glucose-Capillary: 160 mg/dL — ABNORMAL HIGH (ref 70–99)

## 2020-09-13 MED ORDER — CARVEDILOL 25 MG PO TABS
25.0000 mg | ORAL_TABLET | Freq: Two times a day (BID) | ORAL | 0 refills | Status: DC
Start: 2020-09-13 — End: 2020-10-28

## 2020-09-13 MED ORDER — HYDRALAZINE HCL 25 MG PO TABS
25.0000 mg | ORAL_TABLET | Freq: Two times a day (BID) | ORAL | 0 refills | Status: DC
Start: 2020-09-13 — End: 2020-10-28

## 2020-09-13 MED ORDER — EZETIMIBE 10 MG PO TABS
10.0000 mg | ORAL_TABLET | Freq: Every day | ORAL | 0 refills | Status: DC
Start: 2020-09-13 — End: 2020-10-28

## 2020-09-13 MED ORDER — EZETIMIBE 10 MG PO TABS: 10.0000 mg | ORAL_TABLET | Freq: Every day | ORAL | 0 refills | Status: DC

## 2020-09-13 MED ORDER — FUROSEMIDE 40 MG PO TABS: 40.0000 mg | ORAL_TABLET | Freq: Every day | ORAL | 0 refills | Status: DC

## 2020-09-13 MED ORDER — CARVEDILOL 25 MG PO TABS: 25.0000 mg | ORAL_TABLET | Freq: Two times a day (BID) | ORAL | 0 refills | Status: DC

## 2020-09-13 MED ORDER — HYDRALAZINE HCL 25 MG PO TABS: 25.0000 mg | ORAL_TABLET | Freq: Two times a day (BID) | ORAL | 0 refills | Status: DC

## 2020-09-13 NOTE — Care Management Important Message (Signed)
Medicare IM given to the patient by Arrion Broaddus. 

## 2020-09-13 NOTE — TOC Transition Note (Signed)
Transition of Care [facility]) - CM/SW Discharge Note   Patient Details  Name: Molly [name] MRN: [id number] Date of Birth: 21-Apr-1950  Transition of Care [facility]) CM/SW Contact:  Dessa Phi, RN Phone Number: 09/13/2020, 11:27 AM   Clinical Narrative: d/c home today-HHPT-[employer] rep Lattie Haw aware. Home 02-[employer] rep Thedore Mins to deliver home 02 to rom prior d/c. No further CM needs.      Final next level of care: [place] Barriers to Discharge: No Barriers Identified   Patient Goals and CMS Choice Patient states their goals for this hospitalization and ongoing recovery are:: to go home      Discharge Placement                       Discharge Plan and Services   Discharge Planning Services: CM Consult            DME Arranged: [facility] DME Agency: [employer] Date DME Agency Contacted: 09/07/20 Time DME Agency Contacted: [phone] Representative spoke with at DME Agency: zack HH Arranged: PT [place]: [facility] [place] Date [place] Agency Contacted: 09/13/20 Time [place]: 1127 Representative spoke with at [place]: [place] ([place]) Interventions     Readmission Risk Interventions No flowsheet data found.

## 2020-09-13 NOTE — Discharge Summary (Addendum)
Physician Discharge Summary  MERLENE KELSH [id number] DOB: July 30, 1950 DOA: 08/31/2020  PCP: [name], MD  Admit date: 08/31/2020 Discharge date: 09/13/2020  Admitted From: Home  Disposition:  Home   Recommendations for Outpatient Follow-up and new medication changes:  1. Follow up with Dr. Fara Olden in 7 to 10 days.  2. Outpatient follow up with Cardiology for stress test.  3. Follow with pulmonary as outpatient, will need full pulmonary function testing as outpatient.  4. Heart failure management with carvedilol and hydralazine.    Home Health: yes   Equipment/Devices: home 02    Discharge Condition: stable  CODE STATUS: full  Diet recommendation:  Heart healthy   Brief/Interim Summary:  Mrs. Guidroz was admitted to the hospital with the working diagnosis of chest pain to rule out acute coronary syndrome, complicated with hypertensive emergency, acute diastolic heart failure, acute hypoxemic respiratory failure and AKI.   70 yo female with the past medical history of T2DM, HTN, Ckd stage 3b, and peripheral vascular disease who presented with chest pain. 6/10 pain, intermittent, precordial associated with dyspnea. On her initial physical examination her blood pressure was 191/71, HR 79, RR 25, oxygen saturation 69 to 84%, lungs with no wheezing or rhonchi, heart S1 -S2 present and rhythmic, abdomen soft and non tender, no lower extremity edema.   Na 139, K 4,5, Cl 104, bicarbonate 26, glucose 164, BUN 32 and cr at 1,38. Troponin 91-95, wbc 8,9, hgb 12.4, hct 40,2 and platelets 250.  SARS COVID-19 negative.  Chest radiograph with cardiomegaly, congested hilar vasculature and left lower lobe faint infiltrate.  Noncontrast CT chest with left base atelectasis.  EKG 85 bpm, left axis deviation, left anterior fascicular block, sinus rhythm with poor R wave progression, no significant ST segment or T wave changes.  Patient was placed on heparin drip.   Developed  pulmonary edema and required non invasive mechanical ventilation.  Received diuresis with improvement of her symptoms.   Patient will be discharged on home oxygen and outpatient follow up for stress test per cardiology.   1.  Hypertensive emergency, acute on chronic diastolic heart failure exacerbation, acute hypoxemic/hypercapnic respiratory failure due to acute pulmonary edema. Patient admitted to the telemetry ward, she was placed on noninvasive mechanical ventilation and intravenous furosemide. Antihypertensive agents with amlodipine, hydralazine and carvedilol.  Negative fluid balance was achieved, and patient was weaned off noninvasive mechanical ventilation.  She continued to have hypoxemia on ambulation, she will be discharged on 2 L per nasal cannula and follow-up as an outpatient. Continue diuresis at home with 40 mg furosemide daily  2.  Acute kidney injury chronic kidney disease stage IIIb.  Hypokalemia.  Patient tolerated well diuresis with furosemide, potassium was corrected with potassium chloride.  At her discharge sodium 140, potassium 3.6, chloride 96, bicarb 32, BUN 58 and creatinine 2.44. Follow-up kidney function as an outpatient.  3.  Type 2 diabetes mellitus.  Dyslipidemia.  Patient was placed on insulin sliding scale for glucose coverage monitoring. Required basal insulin 7 units daily for hyperglycemia.  Continue rosuvastatin.  4.  Anemia of chronic disease.  Hemoglobin/hematocrit remained stable, no PRBC transfusion.    5. Possible OSA. Follow as outpatient, for now will continue with home 02 supplementation.   Discharge Diagnoses:  Principal Problem:   Hypertensive emergency Active Problems:   HTN (hypertension)   AKI (acute kidney injury) ([place])   Dehydration with hyponatremia   Dyslipidemia   Type 2 diabetes mellitus with stage 3b chronic kidney disease, with  long-term current use of insulin (HCC)   Chest pain   Elevated troponin    Discharge  Instructions   Allergies as of 09/13/2020      Reactions   Mercury Nausea And Vomiting      Medication List    STOP taking these medications   amLODipine 10 MG tablet Commonly known as: NORVASC   dicyclomine 10 MG capsule Commonly known as: BENTYL   gabapentin 100 MG capsule Commonly known as: NEURONTIN   metoprolol succinate 50 MG 24 hr tablet Commonly known as: TOPROL-XL     TAKE these medications   aspirin EC 81 MG tablet Take 81 mg by mouth at bedtime.   carvedilol 25 MG tablet Commonly known as: COREG Take 1 tablet (25 mg total) by mouth 2 (two) times daily with a meal.   Cholecalciferol 25 MCG (1000 UT) tablet Take 2,000 Units by mouth daily.   cilostazol 50 MG tablet Commonly known as: PLETAL Take 1 tablet (50 mg total) by mouth 2 (two) times daily.   diphenhydrAMINE 25 MG tablet Commonly known as: BENADRYL Take 12.5 mg by mouth daily as needed for allergies or sleep.   ezetimibe 10 MG tablet Commonly known as: ZETIA Take 1 tablet (10 mg total) by mouth daily.   famotidine 20 MG tablet Commonly known as: PEPCID Take 20 mg by mouth 2 (two) times daily.   Flintstones Complete 18 MG Chew Chew 1 tablet by mouth daily.   furosemide 40 MG tablet Commonly known as: LASIX Take 1 tablet (40 mg total) by mouth daily.   hydrALAZINE 25 MG tablet Commonly known as: APRESOLINE Take 1 tablet (25 mg total) by mouth every 12 (twelve) hours.   insulin isophane & regular human (70-30) 100 UNIT/ML KwikPen Commonly known as: HUMULIN 70/30 MIX Inject 16-20 Units into the skin daily.   Insulin Pen Needle 32G X 4 MM Misc USE AS DIRECTED TWICE DAILY   ketoconazole 2 % cream Commonly known as: NIZORAL Apply 1 application topically daily as needed for irritation.   levocetirizine 5 MG tablet Commonly known as: XYZAL Take 5 mg by mouth at bedtime.   lipase/protease/amylase 36000 UNITS Cpep capsule Commonly known as: Creon Take 2 capsules (72,000 Units total)  by mouth with breakfast, with lunch, and with evening meal. Take 1 capsule with snacks.   loperamide 2 MG tablet Commonly known as: IMODIUM A-D Take 2 mg by mouth 4 (four) times daily as needed for diarrhea or loose stools.   losartan 50 MG tablet Commonly known as: COZAAR Take 50 mg by mouth daily.   rosuvastatin 40 MG tablet Commonly known as: CRESTOR TAKE 1 TABLET(40 MG) BY MOUTH AT BEDTIME What changed: See the new instructions.   triamcinolone cream 0.1 % Commonly known as: KENALOG Apply 1 application topically daily as needed (spots on legs).            Durable Medical Equipment  (From admission, onward)         Start     Ordered   09/11/20 0612  For home use only DME oxygen  Once       Question Answer Comment  Length of Need 6 Months   Mode or (Route) Nasal cannula   Liters per Minute 2   Frequency Continuous (stationary and portable oxygen unit needed)   Oxygen conserving device Yes   Oxygen delivery system Gas      09/11/20 0612   09/10/20 1512  For home use only DME Walker rolling  Once       Comments: YRW 5'2";71kg  Question Answer Comment  Walker: With [place]   Patient needs a walker to treat with the following condition Ambulatory dysfunction      09/10/20 1511          Follow-up Information    Olalere, Adewale A, MD. Call in 1 day(s).   Specialty: Pulmonary Disease Why: Please call for a post hospital follow up appointment. Contact information: [place] [address] [phone]        [name], MD. Call in 1 day(s).   Specialty: Internal Medicine Contact information: [address] [place] [address] [phone]        Park Liter, MD Follow up.   Specialty: Cardiology Why: Dr. Wendy Poet office will call you to schedule stress test and follow-up. IMPORTANT: PLEASE SEE END OF After-Visit Summary FOR STRESS TEST INSTRUCTIONS including what medicines to hold  the day of the test. Scheduler will notify you of the location. Contact information: [place] [address] [phone]              Allergies  Allergen Reactions  . Mercury Nausea And Vomiting    Consultations:  Cardiology    Procedures/Studies: DG Chest 2 View  Result Date: 08/31/2020 CLINICAL DATA:  70 year old female with new onset left chest pain and shortness of breath since last night. EXAM: CHEST - 2 VIEW COMPARISON:  Chest CT 01/10/2016 and earlier. FINDINGS: PA and lateral views. Patchy abnormal left lung base opacity appears more related to the lingula than the lower lobe on the lateral view. No pleural effusion. No pneumothorax or pulmonary edema. Elsewhere lung markings are stable and within normal limits. Mediastinal contours remain normal. Visualized tracheal air column is within normal limits. Calcified aortic atherosclerosis. No acute osseous abnormality identified. Stable cholecystectomy clips. Negative visible bowel gas pattern. IMPRESSION: Multifocal patchy opacity at the left lung base appears primarily within the lingula. No pleural effusion. This is nonspecific. The patient seem to have had a left lung base pneumonia in 2017 which later resolved. Electronically Signed   By: Genevie Ann M.D.   On: 08/31/2020 07:45   CT CHEST WO CONTRAST  Result Date: 08/31/2020 CLINICAL DATA:  Chest pain. EXAM: CT CHEST WITHOUT CONTRAST TECHNIQUE: Multidetector CT imaging of the chest was performed following the standard protocol without IV contrast. COMPARISON:  January 10, 2016. FINDINGS: Cardiovascular: Atherosclerosis of thoracic aorta is noted without aneurysm formation. Normal cardiac size. Coronary artery calcifications are noted. No pericardial effusion is noted. Mediastinum/Nodes: No enlarged mediastinal or axillary lymph nodes. Thyroid gland, trachea, and esophagus demonstrate no significant findings. Lungs/Pleura: No pneumothorax or pleural effusion is  noted. Left basilar and lingular opacities are noted concerning for atelectasis or pneumonia. Upper Abdomen: No acute abnormality. Musculoskeletal: No chest wall mass or suspicious bone lesions identified. IMPRESSION: Left basilar and lingular opacities are noted concerning for atelectasis or pneumonia. Coronary artery calcifications are noted. Aortic Atherosclerosis (ICD10-I70.0). Electronically Signed   By: Marijo Conception M.D.   On: 08/31/2020 12:30   NM Pulmonary Perfusion  Result Date: 08/31/2020 CLINICAL DATA:  70 year old female with new onset left chest pain and patchy left lung base opacity on chest radiographs today. EXAM: NUCLEAR MEDICINE PERFUSION LUNG SCAN TECHNIQUE: Perfusion images were obtained in multiple projections after intravenous injection of radiopharmaceutical. Ventilation scans intentionally deferred if perfusion scan and chest x-ray adequate for interpretation during COVID  19 epidemic. RADIOPHARMACEUTICALS:  4.1 mCi Tc-27mMAA IV COMPARISON:  Chest radiographs 0713 hours today. FINDINGS: Symmetric and homogeneous perfusion radiotracer activity is noted in both lungs. No perfusion defect is identified. IMPRESSION: Normal lung perfusion imaging.  No evidence of pulmonary embolus. Electronically Signed   By: HGenevie AnnM.D.   On: 08/31/2020 10:34   [place]NAL  Result Date: 09/04/2020 CLINICAL DATA:  Acute renal insufficiency EXAM: RENAL / URINARY TRACT ULTRASOUND COMPLETE COMPARISON:  None. FINDINGS: Right Kidney: Renal measurements: 9.8 x 4.8 x 4.7 cm = volume: 115 mL. Echogenicity within normal limits. No mass or hydronephrosis visualized. Left Kidney: Renal measurements: 9.8 x 4.5 x 4.7 cm = volume: 108 mL. Echogenicity within normal limits. No mass or hydronephrosis visualized. Bladder: Appears normal for degree of bladder distention. Other: None. IMPRESSION: No abnormalities identified. No cause for the patient's symptoms identified. Electronically Signed   By: DDorise BullionIII M.D    On: 09/04/2020 13:39   DG CHEST PORT 1 VIEW  Result Date: 09/10/2020 CLINICAL DATA:  Hypoxia EXAM: PORTABLE CHEST 1 VIEW COMPARISON:  09/05/2020 FINDINGS: Cardiac shadow is enlarged but stable. Lungs are hypoinflated but predominantly clear. Mild left mid lung atelectatic changes and basilar atelectatic changes on the left are noted. No sizable effusion is seen. No bony abnormality is noted. IMPRESSION: Increasing atelectatic changes/infiltrate in the left mid and lower lung. Electronically Signed   By: MInez CatalinaM.D.   On: 09/10/2020 15:29   DG CHEST PORT 1 VIEW  Result Date: 09/05/2020 CLINICAL DATA:  Hypoxia.  Chest pain. EXAM: PORTABLE CHEST 1 VIEW COMPARISON:  Aug 31, 2020 FINDINGS: The study is limited due to the low volume portable technique. Haziness over both lungs may be at least partially technical in nature. Obscuration of the right hemidiaphragm suggest the possibility of a small layering effusion. The left basilar opacity persists but has improved. No other interval changes. IMPRESSION: The study is limited by technique. Suspected improving infiltrate in the left base. Possible small layering effusion on the right. No other acute interval changes. Electronically Signed   By: DDorise BullionIII M.D   On: 09/05/2020 09:59   ECHOCARDIOGRAM COMPLETE  Result Date: 08/31/2020    ECHOCARDIOGRAM REPORT   Patient Name:   VKEMIAH ROLFDate of Exam: 08/31/2020 Medical Rec #:  [id number]   Height:       62.0 in Accession #:    [id number]  Weight:       159.0 lb Date of Birth:  [birth year]   BSA:          1.734 m Patient Age:    89years     BP:           190/77 mmHg Patient Gender: F            HR:           83 bpm. Exam Location:  Inpatient Procedure: 2D Echo, Cardiac Doppler and Color Doppler Indications:    R07.9* Chest pain, unspecified  History:        Patient has prior history of Echocardiogram examinations, most                 recent 11/11/2019. Risk Factors:Hypertension, Diabetes,                  Dyslipidemia and GERD.  Sonographer:    Tiffany Dance Referring Phys: [id number] 1. Left ventricular ejection fraction, by  estimation, is 50 to 55%. Left ventricular ejection fraction by 3D volume is 51 %. The left ventricle has low normal function. The left ventricle has no regional wall motion abnormalities. There is moderate concentric left ventricular hypertrophy. Left ventricular diastolic parameters are consistent with Grade I diastolic dysfunction (impaired relaxation).  2. Right ventricular systolic function is normal. The right ventricular size is normal.  3. The pericardial effusion is circumferential. There is no evidence of cardiac tamponade.  4. The mitral valve is grossly normal. Trivial mitral valve regurgitation. No evidence of mitral stenosis.  5. The aortic valve is tricuspid. There is mild calcification of the aortic valve. There is mild thickening of the aortic valve. Aortic valve regurgitation is not visualized. No aortic stenosis is present.  6. The inferior vena cava is normal in size with greater than 50% respiratory variability, suggesting right atrial pressure of 3 mmHg. Comparison(s): A prior study was performed on 11/11/2019. No significant change from prior study. Prior images reviewed side by side. FINDINGS  Left Ventricle: Left ventricular ejection fraction, by estimation, is 50 to 55%. Left ventricular ejection fraction by 3D volume is 51 %. The left ventricle has low normal function. The left ventricle has no regional wall motion abnormalities. The left ventricular internal cavity size was normal in size. There is moderate concentric left ventricular hypertrophy. Left ventricular diastolic parameters are consistent with Grade I diastolic dysfunction (impaired relaxation). Right Ventricle: The right ventricular size is normal. No increase in right ventricular wall thickness. Right ventricular systolic function is normal. Left Atrium: Left atrial size was  normal in size. Right Atrium: Right atrial size was normal in size. Pericardium: Trivial pericardial effusion is present. The pericardial effusion is circumferential. There is no evidence of cardiac tamponade. Mitral Valve: The mitral valve is grossly normal. Mild mitral annular calcification. Trivial mitral valve regurgitation. No evidence of mitral valve stenosis. Tricuspid Valve: The tricuspid valve is grossly normal. Tricuspid valve regurgitation is not demonstrated. No evidence of tricuspid stenosis. Aortic Valve: The aortic valve is tricuspid. There is mild calcification of the aortic valve. There is mild thickening of the aortic valve. Aortic valve regurgitation is not visualized. No aortic stenosis is present. Pulmonic Valve: The pulmonic valve was grossly normal. Pulmonic valve regurgitation is not visualized. No evidence of pulmonic stenosis. Aorta: The aortic root and ascending aorta are structurally normal, with no evidence of dilitation. Venous: The inferior vena cava is normal in size with greater than 50% respiratory variability, suggesting right atrial pressure of 3 mmHg. IAS/Shunts: The atrial septum is grossly normal.  LEFT VENTRICLE PLAX 2D LVIDd:         4.60 cm         Diastology LVIDs:         2.80 cm         LV e' lateral:   5.98 cm/s LV PW:         1.30 cm         LV E/e' lateral: 19.4 LV IVS:        1.00 cm LVOT diam:     1.80 cm LV SV:         45              3D Volume EF LV SV Index:   26              LV 3D EF:    Left LVOT Area:     2.54 cm  ventricular                                             ejection                                             fraction by                                             3D volume                                             is 51 %.                                 3D Volume EF:                                3D EF:        51 % RIGHT VENTRICLE             IVC RV Basal diam:  3.00 cm     IVC diam: 1.80 cm RV Mid diam:    2.70 cm RV S  prime:     16.20 cm/s TAPSE (M-mode): 1.8 cm LEFT ATRIUM             Index       RIGHT ATRIUM           Index LA diam:        4.70 cm 2.71 cm/m  RA Area:     16.10 cm LA Vol (A2C):   60.7 ml 35.00 ml/m RA Volume:   36.30 ml  20.93 ml/m LA Vol (A4C):   54.0 ml 31.14 ml/m LA Biplane Vol: 58.6 ml 33.79 ml/m  AORTIC VALVE LVOT Vmax:   75.40 cm/s LVOT Vmean:  57.900 cm/s LVOT VTI:    0.177 m  AORTA Ao Root diam: 3.30 cm Ao Asc diam:  3.30 cm MITRAL VALVE MV Area (PHT): 4.46 cm     SHUNTS MV Decel Time: 170 msec     Systemic VTI:  0.18 m MV E velocity: 116.00 cm/s  Systemic Diam: 1.80 cm MV A velocity: 108.00 cm/s MV E/A ratio:  1.07 Rudean Haskell MD Electronically signed by Rudean Haskell MD Signature Date/Time: 08/31/2020/5:09:29 PM    Final    VAS US RENAL ARTERY DUPLEX  Result Date: 09/03/2020 ABDOMINAL VISCERAL Patient Name:  CLEOFAS HAYNER  Date of Exam:   09/03/2020 Medical Rec #: [id number]     Accession #:    [id number] Date of Birth: 1951-02-11     Patient Gender: F Patient Age:   70Y Exam Location:  [facility] Procedure:      VAS US RENAL ARTERY DUPLEX Referring Phys: [id number] Cassie Freer O'NEAL -------------------------------------------------------------------------------- Indications: 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 Limitations: Air/bowel gas and obesity.  Comparison Study: 08/31/2020 renal artery duplex- limited diagnostic quality Performing Technologist: Maudry Mayhew MHA, RDMS, RVT, RDCS  Examination Guidelines: A complete evaluation includes B-mode imaging, spectral Doppler, color Doppler, and power Doppler as needed of all accessible portions of each vessel. Bilateral testing is considered an integral part of a complete examination. Limited examinations for reoccurring indications may be performed as noted.  Duplex Findings: +----------+--------+--------+------+--------+ MesentericPSV cm/sEDV  cm/sPlaqueComments +----------+--------+--------+------+--------+ Aorta Prox   45      13                  +----------+--------+--------+------+--------+    +------------------+--------+--------+------------------+ Right Renal ArteryPSV cm/sEDV cm/s     Comment       +------------------+--------+--------+------------------+ Origin                            Unable to insonate +------------------+--------+--------+------------------+ Proximal            110      19                      +------------------+--------+--------+------------------+ Mid                 140      21                      +------------------+--------+--------+------------------+ Distal               66      8                       +------------------+--------+--------+------------------+ +-----------------+--------+--------+------------------+ Left Renal ArteryPSV cm/sEDV cm/s     Comment       +-----------------+--------+--------+------------------+ Origin              46      11                      +-----------------+--------+--------+------------------+ Proximal                         Unable to insonate +-----------------+--------+--------+------------------+ Mid                 39      9                       +-----------------+--------+--------+------------------+ Distal              45      12                      +-----------------+--------+--------+------------------+ +------------+--------+--------+----+-----------+--------+--------+----+ Right KidneyPSV cm/sEDV cm/sRI  Left KidneyPSV cm/sEDV cm/sRI   +------------+--------+--------+----+-----------+--------+--------+----+ Upper Pole  11      5       0.54Upper Pole 16      5       0.65 +------------+--------+--------+----+-----------+--------+--------+----+ Mid         10      4       0.58Mid        13      4       0.65 +------------+--------+--------+----+-----------+--------+--------+----+ Lower  Pole  8       3       0.63Lower Pole                      +------------+--------+--------+----+-----------+--------+--------+----+ Hilar  21      3       0.87Hilar      23      5       0.79 +------------+--------+--------+----+-----------+--------+--------+----+ +------------------+---------+------------------+------------------+ Right Kidney               Left Kidney                          +------------------+---------+------------------+------------------+ RAR                        RAR                                  +------------------+---------+------------------+------------------+ RAR (manual)      3.11     RAR (manual)      1.02               +------------------+---------+------------------+------------------+ Cortex            12/3 cm/sCortex            Unable to insonate +------------------+---------+------------------+------------------+ Cortex thickness           Corex thickness                      +------------------+---------+------------------+------------------+ Kidney length (cm)11.20    Kidney length (cm)8.91               +------------------+---------+------------------+------------------+  Summary: Renal:  Right: Upper range 1-59% stenosis of the right renal artery by        velocities of visualized segments. RRV flow present. Left:  No evidence of hemodynamically significant left renal artery        stenosis. LRV flow present. Abnormal size for the left        kidney.  *See table(s) above for measurements and observations.  Diagnosing physician: Deitra Mayo MD  Electronically signed by Deitra Mayo MD on 09/03/2020 at 8:20:36 PM.    Final    VAS US RENAL ARTERY DUPLEX  Result Date: 08/31/2020 ABDOMINAL VISCERAL Patient Name:  AZYAH RIJOS  Date of Exam:   08/31/2020 Medical Rec #: [id number]     Accession #:    [id number] Date of Birth: Nov 03, 1950     Patient Gender: F Patient Age:   69Y Exam Location[facility]  [facility]  Procedure:      VAS US RENAL ARTERY DUPLEX Referring Phys: [id number] RHONDA G BARRETT -------------------------------------------------------------------------------- High Risk Factors: Hypertension. Limitations: Air/bowel gas and patient positioning, patient unable to lie flat, rib shadow. Comparison Study: No prior studies. Performing Technologist: Oliver Hum RVT  Examination Guidelines: A complete evaluation includes B-mode imaging, spectral Doppler, color Doppler, and power Doppler as needed of all accessible portions of each vessel. Bilateral testing is considered an integral part of a complete examination. Limited examinations for reoccurring indications may be performed as noted.  Duplex Findings: +--------------------+--------+--------+------+------------------+ Mesenteric          PSV cm/sEDV cm/sPlaque     Comments      +--------------------+--------+--------+------+------------------+ Aorta Mid                                 Unable to insonate +--------------------+--------+--------+------+------------------+ Celiac Artery Origin  Unable to insonate +--------------------+--------+--------+------+------------------+ SMA Origin                                Unable to insonate +--------------------+--------+--------+------+------------------+    +------------------+--------+--------+-------+ Right Renal ArteryPSV cm/sEDV cm/sComment +------------------+--------+--------+-------+ Origin               88      9            +------------------+--------+--------+-------+ Proximal             75      11           +------------------+--------+--------+-------+ Mid                  64      14           +------------------+--------+--------+-------+ Distal               30      7            +------------------+--------+--------+-------+ +-----------------+--------+--------+------------------+ Left Renal ArteryPSV cm/sEDV cm/s     Comment        +-----------------+--------+--------+------------------+ Origin                           Unable to insonate +-----------------+--------+--------+------------------+ Proximal                         Unable to insonate +-----------------+--------+--------+------------------+ Mid                              Unable to insonate +-----------------+--------+--------+------------------+ Distal                           Unable to insonate +-----------------+--------+--------+------------------+  Technologist observations: Unable to insonate the left kidney due to rib shadow and positioning. Unable to insonate the aorta due to overlying bowel gas. +------------+--------+--------+----+-----------+--------+--------+---+ Right KidneyPSV cm/sEDV cm/sRI  Left KidneyPSV cm/sEDV cm/sRI  +------------+--------+--------+----+-----------+--------+--------+---+ Upper Pole  30      7       0.77Upper Pole                     +------------+--------+--------+----+-----------+--------+--------+---+ Mid         30      7       0.75Mid                            +------------+--------+--------+----+-----------+--------+--------+---+ Lower Pole  36      12      0.67Lower Pole                     +------------+--------+--------+----+-----------+--------+--------+---+ Hilar       35      7       0.79Hilar                          +------------+--------+--------+----+-----------+--------+--------+---+ +------------------+-----+------------------++ Right Kidney           Left Kidney        +------------------+-----+------------------++ RAR                    RAR                +------------------+-----+------------------++  RAR (manual)           RAR (manual)       +------------------+-----+------------------++ Cortex                 Cortex             +------------------+-----+------------------++ Cortex thickness       Corex thickness     +------------------+-----+------------------++ Kidney length (cm)11.00Kidney length (cm) +------------------+-----+------------------++  Summary: Renal:  Right: Normal size right kidney.  *See table(s) above for measurements and observations.  Diagnosing physician: Deitra Mayo MD  Electronically signed by Deitra Mayo MD on 08/31/2020 at 3:47:31 PM.    Final       Procedures:   Subjective: Patient is feeling better, no nausea or vomiting, dyspnea continue to improve, no chest pain.   Discharge Exam: Vitals:   09/12/20 2113 09/13/20 0554  BP: (!) 150/47 (!) 148/54  Pulse: (!) 57 (!) 59  Resp: 18 16  Temp: 98.7 F (37.1 C) 98.3 F (36.8 C)  SpO2: 98% 98%   Vitals:   09/12/20 1431 09/12/20 2113 09/13/20 0552 09/13/20 0554  BP: (!) 133/50 (!) 150/47  (!) 148/54  Pulse: (!) 57 (!) 57  (!) 59  Resp: '18 18  16  '$ Temp: 98.2 F (36.8 C) 98.7 F (37.1 C)  98.3 F (36.8 C)  TempSrc: Oral Oral    SpO2: 100% 98%  98%  Weight:   (P) 70.1 kg   Height:        General: Not in pain or dyspnea.  Neurology: Awake and alert, non focal  E ENT: mild pallor, no icterus, oral mucosa moist Cardiovascular: No JVD. S1-S2 present, rhythmic, no gallops, rubs, or murmurs. No lower extremity edema. Pulmonary: positive breath sounds bilaterally, adequate air movement, no wheezing, rhonchi or rales. Gastrointestinal. Abdomen soft and non tender Skin. No rashes Musculoskeletal: no joint deformities   The results of significant diagnostics from this hospitalization (including imaging, microbiology, ancillary and laboratory) are listed below for reference.     Microbiology: No results found for this or any previous visit (from the past 240 hour(s)).   Labs: BNP (last 3 results) Recent Labs    08/31/20 0720 09/05/20 0619 09/11/20 1117  BNP 421.0* 1,047.3* [id number]*   Basic Metabolic Panel: Recent Labs  Lab 09/07/20 0248 09/08/20 0251 09/09/20 0537 09/10/20 0519  09/10/20 1320 09/11/20 0419 09/13/20 0527  NA 140 141 142 140 140 142 140  K 3.7 3.5 3.3* 3.4* 3.5 3.8 3.6  CL 105 100 96* 92* 94* 94* 96*  CO2 26 32 35* 39* 38* 38* 36*  GLUCOSE 143* 178* 204* 221* 196* 160* 230*  BUN 64* 65* 61* 60* 57* 59* 58*  CREATININE 2.45* 2.40* 2.35* 2.75* 2.50* 2.28* 2.44*  CALCIUM 8.5* 8.6* 8.4* 8.7* 8.4* 8.6* 8.6*  MG 2.2 2.1  --   --   --   --   --   PHOS 4.5  4.4 4.1  --   --   --   --   --    Liver Function Tests: Recent Labs  Lab 09/07/20 0248 09/08/20 0251  ALBUMIN 3.1* 3.3*   No results for input(s): LIPASE, AMYLASE in the last 168 hours. No results for input(s): AMMONIA in the last 168 hours. CBC: Recent Labs  Lab 09/07/20 0248 09/08/20 0251 09/11/20 0419  WBC 5.6 6.2 6.5  HGB 9.8* 10.0* 9.8*  HCT 31.5* 32.9* 32.0*  MCV 89.2 90.1 90.1  PLT 261 263 245  Cardiac Enzymes: No results for input(s): CKTOTAL, CKMB, CKMBINDEX, TROPONINI in the last 168 hours. BNP: Invalid input(s): POCBNP CBG: Recent Labs  Lab 09/12/20 0747 09/12/20 1315 09/12/20 1718 09/12/20 2110 09/13/20 0708  GLUCAP 179* 240* 200* 203* 222*   D-Dimer No results for input(s): DDIMER in the last 72 hours. Hgb A1c No results for input(s): HGBA1C in the last 72 hours. Lipid Profile No results for input(s): CHOL, HDL, LDLCALC, TRIG, CHOLHDL, LDLDIRECT in the last 72 hours. Thyroid function studies No results for input(s): TSH, T4TOTAL, T3FREE, THYROIDAB in the last 72 hours.  Invalid input(s): FREET3 Anemia work up No results for input(s): VITAMINB12, FOLATE, FERRITIN, TIBC, IRON, RETICCTPCT in the last 72 hours. Urinalysis    Component Value Date/Time   COLORURINE YELLOW 06/29/2015 1636   APPEARANCEUR HAZY (A) 06/29/2015 1636   LABSPEC 1.030 06/29/2015 1636   PHURINE 5.5 06/29/2015 1636   GLUCOSEU >1000 (A) 06/29/2015 1636   HGBUR NEGATIVE 06/29/2015 1636   BILIRUBINUR NEGATIVE 06/29/2015 1636   KETONESUR NEGATIVE 06/29/2015 1636   PROTEINUR  NEGATIVE 06/29/2015 1636   NITRITE NEGATIVE 06/29/2015 1636   LEUKOCYTESUR NEGATIVE 06/29/2015 1636   Sepsis Labs Invalid input(s): PROCALCITONIN,  WBC,  LACTICIDVEN Microbiology No results found for this or any previous visit (from the past 240 hour(s)).   Time coordinating discharge: 45 minutes  SIGNED:   Tawni Millers, MD  [facility] 09/13/2020, 10:11 AM

## 2020-09-13 NOTE — Progress Notes (Signed)
Physical Therapy Treatment Patient Details Name: Molly [name] MRN: [id number] DOB: July 07, 1950 Today's Date: 09/13/2020    History of Present Illness 70 y.o. female presented with left-sided nonradiating chest pain associated with dyspnea and nausea.  Work-up in the ED revealed hypertensive emergency with SBP in the 200s and elevated troponin. Dx of possible NSTEMI, AKI. Pt with medical history significant of DM2, HTN, CKD3b, PAD.    PT Comments    Pt assisted with ambulating in hallway and continues to require supplemental oxygen.  Pt anticipates d/c home today.   Follow Up Recommendations  Home health PT;Supervision for mobility/OOB     Equipment Recommendations  Rolling walker with 5" wheels    Recommendations for Other Services       Precautions / Restrictions Precautions Precautions: Fall Precaution Comments: monitor O2    Mobility  Bed Mobility Overal bed mobility: Modified Independent                  Transfers Overall transfer level: Needs assistance Equipment used: Rolling walker (2 wheeled) Transfers: Sit to/from Stand Sit to Stand: Min guard         General transfer comment: cues for hand placement; Spo2 87% on room air so applied 2L O2 [place] for mobility  Ambulation/Gait Ambulation/Gait assistance: Min guard Gait Distance (Feet): 120 Feet Assistive device: Rolling walker (2 wheeled) Gait Pattern/deviations: Step-through pattern;Decreased stride length Gait velocity: decr   General Gait Details: cues for posture and position from RW; pt denies SOB, SPO2 98% on 2L O2 [place]   Stairs             Wheelchair Mobility    Modified Rankin (Stroke Patients Only)       Balance                                            Cognition Arousal/Alertness: Awake/alert Behavior During Therapy: WFL for tasks assessed/performed Overall Cognitive Status: Within Functional Limits for tasks assessed                                         Exercises      General Comments        Pertinent Vitals/Pain Pain Assessment: No/denies pain    Home Living                      Prior Function            PT Goals (current goals can now be found in the care plan section) Progress towards PT goals: Progressing toward goals    Frequency    Min 3X/week      PT Plan Current plan remains appropriate    Co-evaluation              AM-PAC PT "6 Clicks" Mobility   Outcome Measure  Help needed turning from your back to your side while in a flat bed without using bedrails?: A Little Help needed moving from lying on your back to sitting on the side of a flat bed without using bedrails?: A Little Help needed moving to and from a bed to a chair (including a wheelchair)?: A Little Help needed standing up from a chair using your arms (e.g., wheelchair or bedside chair)?: A Little Help  needed to walk in hospital room?: A Little Help needed climbing 3-5 steps with a railing? : A Little 6 Click Score: 18    End of Session Equipment Utilized During Treatment: Gait belt;Oxygen Activity Tolerance: Patient tolerated treatment well Patient left: in chair;with call bell/phone within reach;with family/visitor present Nurse Communication: Mobility status PT Visit Diagnosis: Difficulty in walking, not elsewhere classified (R26.2)     Time: 1030-1046 PT Time Calculation (min) (ACUTE ONLY): 16 min  Charges:  $Gait Training: 8-22 mins                    Arlyce Dice, DPT Acute Rehabilitation Services Pager: [phone] Office: [phone]  [name],[name] 09/13/2020, 1:00 PM

## 2020-09-13 NOTE — Progress Notes (Signed)
WEnt over discharge papers with patient and family.  All questions answered.  Portable oxygen in room.  Pt set up on 2 L and wheeled out by RN.

## 2020-09-14 ENCOUNTER — Other Ambulatory Visit: Payer: Self-pay

## 2020-09-14 DIAGNOSIS — R072 Precordial pain: Secondary | ICD-10-CM

## 2020-09-14 NOTE — Addendum Note (Signed)
Addended by: Thora Lance on: 09/14/2020 01:51 PM   Modules accepted: Orders

## 2020-09-28 ENCOUNTER — Encounter (HOSPITAL_COMMUNITY): Payer: Self-pay | Admitting: *Deleted

## 2020-09-28 ENCOUNTER — Telehealth (HOSPITAL_COMMUNITY): Payer: Self-pay | Admitting: *Deleted

## 2020-09-28 NOTE — Telephone Encounter (Signed)
Stress Test instructions sent via my chart per request of  patient.  Molly [name]

## 2020-09-30 ENCOUNTER — Other Ambulatory Visit: Payer: Self-pay | Admitting: Internal Medicine

## 2020-10-06 ENCOUNTER — Other Ambulatory Visit: Payer: Self-pay

## 2020-10-06 ENCOUNTER — Encounter (HOSPITAL_COMMUNITY): Payer: Medicare Other

## 2020-10-06 ENCOUNTER — Encounter (HOSPITAL_COMMUNITY): Payer: Self-pay | Admitting: Internal Medicine

## 2020-10-07 ENCOUNTER — Ambulatory Visit: Payer: Medicare Other | Admitting: Cardiology

## 2020-10-07 ENCOUNTER — Encounter: Payer: Self-pay | Admitting: Cardiology

## 2020-10-07 ENCOUNTER — Other Ambulatory Visit: Payer: Self-pay

## 2020-10-07 VITALS — BP 102/50 | HR 73 | Ht 62.0 in | Wt 143.0 lb

## 2020-10-07 DIAGNOSIS — I509 Heart failure, unspecified: Secondary | ICD-10-CM | POA: Insufficient documentation

## 2020-10-07 DIAGNOSIS — E1122 Type 2 diabetes mellitus with diabetic chronic kidney disease: Secondary | ICD-10-CM

## 2020-10-07 DIAGNOSIS — I739 Peripheral vascular disease, unspecified: Secondary | ICD-10-CM

## 2020-10-07 DIAGNOSIS — I10 Essential (primary) hypertension: Secondary | ICD-10-CM

## 2020-10-07 DIAGNOSIS — I5032 Chronic diastolic (congestive) heart failure: Secondary | ICD-10-CM

## 2020-10-07 DIAGNOSIS — Z794 Long term (current) use of insulin: Secondary | ICD-10-CM

## 2020-10-07 DIAGNOSIS — E785 Hyperlipidemia, unspecified: Secondary | ICD-10-CM

## 2020-10-07 DIAGNOSIS — N1832 Chronic kidney disease, stage 3b: Secondary | ICD-10-CM

## 2020-10-07 NOTE — Progress Notes (Signed)
Cardiology Office Note:    Date:  10/07/2020   ID:  Molly [name], Molly [name] 10-04-1950, MRN [id number]  PCP:  [name], MD  Cardiologist:  Jenne Campus, MD    Referring MD: [name],*   Chief Complaint  Patient presents with   Follow-up    [facility] 08/31/20 discharged on 05/30    History of Present Illness:    Molly [name] is a 70 y.o. female with extremely complex past medical history.  He does have significant peripheral vascular disease with claudications, essential hypertension, diabetes, dyslipidemia, kidney dysfunction.  Recently she ended up in the hospital.  She was there because of decompensated congestive heart failure, her troponin was minimally elevated.  Feeling was that all those problems were related to hypertensive crisis.  All were managed appropriately and she improved quite significantly from symptomatology point review, however, kidney function significantly deteriorated with latest creatinine more than 4.  She has been followed by nephrologist nephrologist discontinued her diuretic as well as ARB.  She does have follow-up appoint with him in 3 weeks.  When she was in the hospital there was some idea about doing cardiac catheterization on her to check what the burden of coronary artery she does have that explain her cardiomyopathy/congestive heart failure, however this idea has been abandoned secondary to the fact that she developed significant kidney failure.  There was some talks about potentially doing stress test however she did not want to have any intervention done because of risk of complete kidney shutdown.  And we are in the situation that has not decision being made.  She denies have any chest pain tightness squeezing pressure burning chest she complains of lack of appetite lack of strength there is no swelling of lower extremities no shortness of breath.  She use oxygen all the time  Past Medical History:  Diagnosis Date   Abdominal aortic  atherosclerosis with stenosis 06/29/2015   AKI (acute kidney injury) ([place]) 06/29/2015   Atherosclerosis of coronary artery without angina pectoris 04/21/2020   Atopic dermatitis 04/21/2020   Benign hypertension with CKD (chronic kidney disease) stage III ([place]) 04/21/2020   Bilateral impacted cerumen 08/21/2019   Chest pain 08/31/2020   Cholecystitis 06/29/2015   Cholelithiasis 06/29/2015   Claudication in peripheral vascular disease ([place]) 08/28/2019   Peripheral arterial disease   Colon cancer screening 04/21/2020   Dehydration with hyponatremia 06/29/2015   Diabetes mellitus ([place]) 07/15/2018   Diarrhea 04/21/2020   DKA (diabetic ketoacidoses) 06/29/2015   Dyslipidemia 08/30/2018   Elevated troponin    Epigastric pain 04/21/2020   Gastroesophageal reflux disease 04/21/2020   HTN (hypertension) 06/29/2015   Hyperglycemia due to type 2 diabetes mellitus ([place]) 04/21/2020   Hypertension    Hypertensive emergency 07/12/2020   Hypertensive heart disease without congestive heart failure 04/21/2020   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   Proptosis 04/21/2020   Proximal leg weakness 09/10/2018   Ptosis of left eyelid 09/10/2018   Renal artery  stenosis ([place]) 12/17/2019   Renal artery stenosis   Standard chest x-ray abnormal 04/21/2020   Vitamin D deficiency 04/21/2020   Weight loss 04/21/2020    Past Surgical History:  Procedure Laterality Date   ABDOMINAL AORTOGRAM W/LOWER EXTREMITY  N/A 08/28/2019   Procedure: ABDOMINAL AORTOGRAM W/LOWER EXTREMITY;  Surgeon: Lorretta Harp, MD;  Location: [facility];  Service: Cardiovascular;  Laterality: N/A;   CATARACT EXTRACTION, BILATERAL     CHOLECYSTECTOMY N/A 06/30/2015   Procedure: LAPAROSCOPIC CHOLECYSTECTOMY WITH INTRAOPERATIVE CHOLANGIOGRAM;  Surgeon: Donnie Mesa, MD;  Location: [facility];  Service: General;  Laterality: N/A;   PERIPHERAL VASCULAR BALLOON ANGIOPLASTY Right 08/28/2019   Procedure: PERIPHERAL VASCULAR BALLOON ANGIOPLASTY;  Surgeon: Lorretta Harp, MD;  Location: [facility];  Service: Cardiovascular;  Laterality: Right;  attempted SFA    Current Medications: Current Meds  Medication Sig   carvedilol (COREG) 25 MG tablet Take 1 tablet (25 mg total) by mouth 2 (two) times daily with a meal.   Cholecalciferol 25 MCG (1000 UT) tablet Take 2,000 Units by mouth daily.   cilostazol (PLETAL) 50 MG tablet Take 1 tablet (50 mg total) by mouth 2 (two) times daily.   diphenhydrAMINE (BENADRYL) 25 MG tablet Take 12.5 mg by mouth daily as needed for allergies or sleep.   ezetimibe (ZETIA) 10 MG tablet Take 1 tablet (10 mg total) by mouth daily.   famotidine (PEPCID) 20 MG tablet Take 1 tablet by mouth in the morning and at bedtime.   hydrALAZINE (APRESOLINE) 25 MG tablet Take 1 tablet (25 mg total) by mouth every 12 (twelve) hours.   insulin isophane & regular human (HUMULIN 70/30 MIX) (70-30) 100 UNIT/ML KwikPen Inject 16-20 Units into the skin daily.   ketoconazole (NIZORAL) 2 % cream Apply 1 application topically daily as needed for irritation.    levocetirizine (XYZAL) 5 MG tablet Take 5 mg by mouth at bedtime.    loperamide (IMODIUM A-D) 2 MG tablet Take 2 mg by mouth 4 (four) times daily as needed for diarrhea or loose stools.   Pediatric Multivitamins-Iron (FLINTSTONES COMPLETE) 18 MG CHEW Chew 1 tablet by mouth daily. Unknown strength   rosuvastatin (CRESTOR) 40 MG tablet TAKE 1 TABLET(40 MG) BY MOUTH  AT BEDTIME (Patient taking differently: Take 40 mg by mouth daily.)   triamcinolone cream (KENALOG) 0.1 % Apply 1 application topically daily as needed (spots on legs).      Allergies:   Lisinopril and Mercury   Social History   Socioeconomic History   Marital status: Married    Spouse name: Not on file   Number of children: Not on file   Years of education: Not on file   Highest education level: Not on file  Occupational History   Not on file  Tobacco Use   Smoking status: Never   Smokeless tobacco: Never  Vaping Use   Vaping Use: Never used  Substance and Sexual Activity   Alcohol use: No   Drug use: Never   Sexual activity: Not on file  Other Topics Concern   Not on file  Social History Narrative   Not on file   Social Determinants of Health   Financial Resource Strain: Not on file  Food Insecurity: Not on file  Transportation Needs: Not on file  Physical Activity: Not on file  Stress: Not on file  Social Connections: Not on file     Family History: The patient's family history includes Breast cancer in her mother; Hypertension  in her father and mother; Pancreatic cancer in an other family member. ROS:   Please see the history of present illness.    All 14 point review of systems negative except as described per history of present illness  EKGs/Labs/Other Studies Reviewed:      Recent Labs: 08/31/2020: TSH 2.514 09/05/2020: ALT 22 09/08/2020: Magnesium 2.1 09/11/2020: B Natriuretic Peptide 110.3; Hemoglobin 9.8; Platelets 245 09/13/2020: BUN 58; Creatinine, Ser 2.44; Potassium 3.6; Sodium 140  Recent Lipid Panel    Component Value Date/Time   CHOL 190 08/31/2020 0930   CHOL 116 12/10/2019 1136   TRIG 61 08/31/2020 0930   HDL 71 08/31/2020 0930   HDL 52 12/10/2019 1136   CHOLHDL 2.7 08/31/2020 0930   VLDL 12 08/31/2020 0930   LDLCALC 107 (H) 08/31/2020 0930   LDLCALC 46 12/10/2019 1136    Physical Exam:    VS:  BP (!) 102/50 (BP Location: Right  Arm, Patient Position: Sitting)   Pulse 73   Ht '5\' 2"'$  (1.575 m)   Wt 143 lb (64.9 kg)   SpO2 98%   BMI 26.16 kg/m     Wt Readings from Last 3 Encounters:  10/07/20 143 lb (64.9 kg)  09/13/20 (P) 154 lb 9.6 oz (70.1 kg)  06/03/20 157 lb 9.6 oz (71.5 kg)     GEN:  Well nourished, well developed in no acute distress HEENT: Normal NECK: No JVD; No carotid bruits LYMPHATICS: No lymphadenopathy CARDIAC: RRR, no murmurs, no rubs, no gallops RESPIRATORY:  Clear to auscultation without rales, wheezing or rhonchi  ABDOMEN: Soft, non-tender, non-distended MUSCULOSKELETAL:  No edema; No deformity  SKIN: Warm and dry LOWER EXTREMITIES: no swelling NEUROLOGIC:  Alert and oriented x 3 PSYCHIATRIC:  Normal affect   ASSESSMENT:    1. Peripheral vascular disease ([place])   2. Chronic diastolic congestive heart failure ([place])   3. Primary hypertension   4. Type 2 diabetes mellitus with stage 3b chronic kidney disease, with long-term current use of insulin ([place])   5. Dyslipidemia    PLAN:    In order of problems listed above:  Congestive heart failure diastolic in nature.  Blood pressure well controlled.  I will reduce actually dose of Apresoline to only 12.5 twice daily because blood pressure being low and if her feeling very weak tired and exhausted.  She is on Coreg 25 twice daily which I will continue, I will not put her on an ARB/ACE inhibitor/Entresto secondary to the fact that she does have significant kidney dysfunction.  In terms of etiology of her congestive heart failure suspect this was related to hypertensive crisis.  However, because of her advanced peripheral vascular disease coronary disease should be contemplated.  Again she does not want to have a cardiac catheterization which I understand it will put her to dialysis most likely.  However with proper management of her medication anticipate improvement in her kidney function then we can talk about potentially doing stress test and  eventually cardiac catheterization. Essential hypertension blood pressure actually on the lower side.  We will reduce some of the medications Diabetes followed by antimedicine team Dyslipidemia she is on Zetia as well as Crestor 40 which I will continue Peripheral vascular disease: Stable Kidney failure: Followed by nephrology  I reviewed record from hospital for this visit   Medication Adjustments/Labs and Tests Ordered: Current medicines are reviewed at length with the patient today.  Concerns regarding medicines are outlined above.  No orders of the defined types were placed  in this encounter.  Medication changes: No orders of the defined types were placed in this encounter.   Signed, Park Liter, MD, [facility] 10/07/2020 3:18 PM    [place]

## 2020-10-07 NOTE — Patient Instructions (Signed)

## 2020-10-08 ENCOUNTER — Ambulatory Visit: Payer: Medicare Other | Admitting: Pulmonary Disease

## 2020-10-08 ENCOUNTER — Ambulatory Visit (INDEPENDENT_AMBULATORY_CARE_PROVIDER_SITE_OTHER): Payer: Medicare Other

## 2020-10-08 ENCOUNTER — Encounter: Payer: Self-pay | Admitting: Pulmonary Disease

## 2020-10-08 ENCOUNTER — Other Ambulatory Visit: Payer: Self-pay

## 2020-10-08 VITALS — BP 102/50 | HR 78 | Ht 62.0 in | Wt 143.2 lb

## 2020-10-08 DIAGNOSIS — R0609 Other forms of dyspnea: Secondary | ICD-10-CM

## 2020-10-08 DIAGNOSIS — J189 Pneumonia, unspecified organism: Secondary | ICD-10-CM

## 2020-10-08 DIAGNOSIS — R0600 Dyspnea, unspecified: Secondary | ICD-10-CM

## 2020-10-08 NOTE — Patient Instructions (Addendum)
Nice to meet you  We will get breathing or pulmonary function tests in the coming weeks to help [place] understand why you are short of breath.  We will get a CXR today  We will walk around and see if the oxygen drops today.  Return to clinic in 3 months or sooner as needed

## 2020-10-08 NOTE — Progress Notes (Signed)
@Patient  ID: Molly [name], female    DOB: [birth year], 70 y.o.   MRN: [id number]  Chief Complaint  Patient presents with   Hospitalization Follow-up    Shortness of breath    Referring provider: [name]  HPI:   70 year old whome we are seeing in consultation for evaluation of DEO, hypoxemic respiratory failure. PCP note reviewed. Recent hospitalization reviewed including H+P and Discharge Summary.  Presented to ED 08/31/20 with chest pain. Noted to hypertensive, desaturation to 69%. Placed on [place]. Marland Kitchen Noted to have CKD. Worsened and required NIPPV. Diuresed. BP controlled with oral agents. LHC deferred given CKD. TTE reviewed with low normal EF 50%, G1 ddfxn, RV size and function ok. CXR on admission reviewed with left sided infiltrate on my interpreation. VQ scan low risk PE on review. CT chest w/o contrast with patchy lingula and LLL infiltrates. No abx. Improved. Discharged on 2L [place]. PCCM was not consulted.   She has DOE. Present on flat surfaces, worse on staris/inclines. Using 2L . Not checking O2 saturations. Thinks symptoms have improved. Feels weak but slowly getting stronger. No breathing issues prior to admission.   PMH: HTN, Ddfxn Family History: HTN breast cancer in mom, HTN in dad Surgical history: gall bladder, cataract Social History: never smoker, lives on [address] / Pulmonary Flowsheets:   ACT:  No flowsheet data found.  MMRC: No flowsheet data found.  Epworth:  No flowsheet data found.  Tests:   FENO:  No results found for: NITRICOXIDE  PFT: No flowsheet data found.  WALK:  SIX MIN WALK 10/08/2020  Supplimental Oxygen during Test? (L/min) No  Tech Comments: Patient had to rest was able to talk while ambulating with walker    Imaging: Reviewed and as per EMR and discussion in this note DG Chest 2 View  Result Date: 10/10/2020 CLINICAL DATA:  Follow-up left upper lobe pneumonia. EXAM: CHEST - 2 VIEW COMPARISON:   Radiograph 09/10/2020.  Chest CT 08/31/2020 FINDINGS: Improved aeration of the left lung base from prior exam at site of previous pneumonia. There is residual subsegmental opacity that likely represent scarring. Minimal blunting of the costophrenic angle may represent trace effusion or additional scarring. No new consolidation. The right lung is clear. Stable heart size and mediastinal contours. Aortic atherosclerosis. No pulmonary edema or pneumothorax. IMPRESSION: Improved aeration of the left lung base from prior exam at site of previous pneumonia. Residual subsegmental opacity likely represents scarring. Minimal blunting of the costophrenic angle may represent trace effusion or additional scarring. Electronically Signed   By: Keith Rake M.D.   On: 10/10/2020 14:39    Lab Results: Personally reviewed CBC    Component Value Date/Time   WBC 6.5 09/11/2020 0419   RBC 3.55 (L) 09/11/2020 0419   HGB 9.8 (L) 09/11/2020 0419   HGB 12.5 08/19/2019 1111   HCT 32.0 (L) 09/11/2020 0419   HCT 39.4 08/19/2019 1111   PLT 245 09/11/2020 0419   PLT 299 08/19/2019 1111   MCV 90.1 09/11/2020 0419   MCV 86 08/19/2019 1111   MCH 27.6 09/11/2020 0419   MCHC 30.6 09/11/2020 0419   RDW 13.8 09/11/2020 0419   RDW 12.7 08/19/2019 1111   LYMPHSABS 1.8 06/03/2020 1019   MONOABS 0.5 06/03/2020 1019   EOSABS 0.4 06/03/2020 1019   BASOSABS 0.0 06/03/2020 1019    BMET    Component Value Date/Time   NA 140 09/13/2020 0527   NA 141 09/03/2019 1600   K 3.6 09/13/2020  0527   CL 96 (L) 09/13/2020 0527   CO2 36 (H) 09/13/2020 0527   GLUCOSE 230 (H) 09/13/2020 0527   BUN 58 (H) 09/13/2020 0527   BUN 24 09/03/2019 1600   CREATININE 2.44 (H) 09/13/2020 0527   CALCIUM 8.6 (L) 09/13/2020 0527   GFRNONAA 21 (L) 09/13/2020 0527   GFRAA 59 (L) 09/03/2019 1600    BNP    Component Value Date/Time   BNP 110.3 (H) 09/11/2020 1117    ProBNP No results found for: PROBNP  Specialty Problems    None  Allergies  Allergen Reactions   Lisinopril Other (See Comments)   Mercury Nausea And Vomiting    Immunization History  Administered Date(s) Administered   Influenza,inj,Quad PF,6+ Mos 06/13/2017   MMR 09/02/2008   PFIZER(Purple Top)SARS-COV-2 Vaccination 06/08/2019, 07/02/2019   Pneumococcal Conjugate-13 10/15/2017   Td 08/31/2008    Past Medical History:  Diagnosis Date   Abdominal aortic atherosclerosis with stenosis 06/29/2015   AKI (acute kidney injury) ([place]) 06/29/2015   Atherosclerosis of coronary artery without angina pectoris 04/21/2020   Atopic dermatitis 04/21/2020   Benign hypertension with CKD (chronic kidney disease) stage III ([place]) 04/21/2020   Bilateral impacted cerumen 08/21/2019   Chest pain 08/31/2020   Cholecystitis 06/29/2015   Cholelithiasis 06/29/2015   Claudication in peripheral vascular disease ([place]) 08/28/2019   Peripheral arterial disease   Colon cancer screening 04/21/2020   Dehydration with hyponatremia 06/29/2015   Diabetes mellitus ([place]) 07/15/2018   Diarrhea 04/21/2020   DKA (diabetic ketoacidoses) 06/29/2015   Dyslipidemia 08/30/2018   Elevated troponin    Epigastric pain 04/21/2020   Gastroesophageal reflux disease 04/21/2020   HTN (hypertension) 06/29/2015   Hyperglycemia due to type 2 diabetes mellitus ([place]) 04/21/2020   Hypertension    Hypertensive emergency 07/12/2020   Hypertensive heart disease without congestive heart failure 04/21/2020   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) 07/15/2018   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   Cholecalciferol 25 MCG (1000 UT) tablet Take 2,000 Units by mouth daily.   cilostazol (PLETAL) 50 MG tablet Take 1 tablet (50 mg total) by mouth 2 (two) times daily.   diphenhydrAMINE (BENADRYL) 25 MG tablet Take 12.5 mg by mouth daily as needed for allergies or sleep.   ezetimibe (ZETIA) 10 MG tablet Take 1 tablet (10 mg total) by mouth daily.   famotidine (PEPCID) 20 MG  tablet Take 1 tablet by mouth in the morning and at bedtime.   hydrALAZINE (APRESOLINE) 25 MG tablet Take 1 tablet (25 mg total) by mouth every 12 (twelve) hours.   insulin isophane & regular human (HUMULIN 70/30 MIX) (70-30) 100 UNIT/ML KwikPen Inject 16-20 Units into the skin daily.   ketoconazole (NIZORAL) 2 % cream Apply 1 application topically daily as needed for irritation.    levocetirizine (XYZAL) 5 MG tablet Take 5 mg by mouth at bedtime.    loperamide (IMODIUM A-D) 2 MG tablet Take 2 mg by mouth 4 (four) times daily as needed for diarrhea or loose stools.   Pediatric Multivitamins-Iron (FLINTSTONES COMPLETE) 18 MG CHEW Chew 1 tablet by mouth daily. Unknown strength   rosuvastatin (CRESTOR) 40 MG tablet TAKE 1 TABLET(40 MG) BY MOUTH AT BEDTIME (Patient taking differently: Take 40 mg by mouth daily.)   triamcinolone cream (KENALOG) 0.1 % Apply 1 application topically  daily as needed (spots on legs).    [DISCONTINUED] amLODipine (NORVASC) 10 MG tablet Take 1 tablet by mouth daily.   [DISCONTINUED] aspirin EC 81 MG tablet Take 81 mg by mouth at bedtime.    [DISCONTINUED] famotidine (PEPCID) 20 MG tablet Take 20 mg by mouth 2 (two) times daily.   [DISCONTINUED] furosemide (LASIX) 40 MG tablet Take 1 tablet (40 mg total) by mouth daily.   [DISCONTINUED] gabapentin (NEURONTIN) 100 MG capsule Take 1 capsule by mouth in the morning, at noon, and at bedtime.   [DISCONTINUED] hydrALAZINE (APRESOLINE) 25 MG tablet Take 1 tablet by mouth every 12 (twelve) hours.   [DISCONTINUED] Insulin Pen Needle 32G X 4 MM MISC USE AS DIRECTED TWICE DAILY   [DISCONTINUED] lipase/protease/amylase (CREON) 36000 UNITS CPEP capsule Take 2 capsules (72,000 Units total) by mouth with breakfast, with lunch, and with evening meal. Take 1 capsule with snacks.   [DISCONTINUED] losartan (COZAAR) 50 MG tablet Take 50 mg by mouth daily.   No facility-administered encounter medications on file as of 10/08/2020.     Review of Systems  Review of Systems  No chest pain with exertion. No orthopnea or PND. Comprehensive ROS otherwise negative.  Physical Exam  BP (!) 102/50   Pulse 78   Ht 5' 2"  (1.575 m)   Wt 143 lb 3.2 oz (65 kg)   SpO2 99%   BMI 26.19 kg/m   Wt Readings from Last 5 Encounters:  10/08/20 143 lb 3.2 oz (65 kg)  10/07/20 143 lb (64.9 kg)  09/13/20 (P) 154 lb 9.6 oz (70.1 kg)  06/03/20 157 lb 9.6 oz (71.5 kg)  05/18/20 155 lb (70.3 kg)    BMI Readings from Last 5 Encounters:  10/08/20 26.19 kg/m  10/07/20 26.16 kg/m  09/13/20 (P) 28.28 kg/m  06/03/20 28.83 kg/m  05/18/20 28.35 kg/m     Physical Exam Gen: Chronically ill appearing, in NAD Eyes: EOMI, no icterus Neck: Supple, no JVP appreciated CV: RRR, no murmur Pulm: CTAB, NWOB Abd: ND, BS present MSK: No joint effusion, no synovitis Neuro: Slow gait, no focal weakness Psych: Normal mood, full  affect   Assessment & Plan:   Hypoxemic Respiratory Failure: Unclear etiology, infiltrates on CT/CXR are not enough to account for hypoxemia. Query if O2 needed. Ambulated 2 laps in clinic on room air without desaturation. Encouraged her to monitor O2 sat at home via home monitor and try off O2, goal saturation 88%. To report back results. Plan 6 minute walk at next visit.   Infiltrate on CXR/CT: Not treated with  abx, suspect atelectasis. Repeat CXR today to evaluate resolution.    Return in about 3 months (around 01/08/2021).   Lanier Clam, MD 10/12/2020

## 2020-10-15 ENCOUNTER — Other Ambulatory Visit: Payer: Self-pay

## 2020-10-15 ENCOUNTER — Ambulatory Visit (INDEPENDENT_AMBULATORY_CARE_PROVIDER_SITE_OTHER): Payer: Medicare Other | Admitting: Pulmonary Disease

## 2020-10-15 DIAGNOSIS — R0609 Other forms of dyspnea: Secondary | ICD-10-CM

## 2020-10-15 DIAGNOSIS — N179 Acute kidney failure, unspecified: Secondary | ICD-10-CM | POA: Diagnosis not present

## 2020-10-15 DIAGNOSIS — R0600 Dyspnea, unspecified: Secondary | ICD-10-CM

## 2020-10-15 LAB — PULMONARY FUNCTION TEST
DL/VA % pred: 73 %
DL/VA: 3.12 ml/min/mmHg/L
DLCO cor % pred: 44 %
DLCO cor: 8.23 ml/min/mmHg
DLCO unc % pred: 41 %
DLCO unc: 7.67 ml/min/mmHg
FEF 25-75 Post: 1.58 L/sec
FEF 25-75 Pre: 1.06 L/sec
FEF2575-%Change-Post: 48 %
FEF2575-%Pred-Post: 96 %
FEF2575-%Pred-Pre: 65 %
FEV1-%Change-Post: 13 %
FEV1-%Pred-Post: 63 %
FEV1-%Pred-Pre: 55 %
FEV1-Post: 1.08 L
FEV1-Pre: 0.95 L
FEV1FVC-%Change-Post: 2 %
FEV1FVC-%Pred-Pre: 105 %
FEV6-%Change-Post: 10 %
FEV6-%Pred-Post: 60 %
FEV6-%Pred-Pre: 54 %
FEV6-Post: 1.28 L
FEV6-Pre: 1.16 L
FEV6FVC-%Pred-Post: 104 %
FEV6FVC-%Pred-Pre: 104 %
FVC-%Change-Post: 10 %
FVC-%Pred-Post: 58 %
FVC-%Pred-Pre: 52 %
FVC-Post: 1.28 L
FVC-Pre: 1.16 L
Post FEV1/FVC ratio: 84 %
Post FEV6/FVC ratio: 100 %
Pre FEV1/FVC ratio: 82 %
Pre FEV6/FVC Ratio: 100 %
RV % pred: 240 %
RV: 4.92 L
TLC % pred: 131 %
TLC: 6.23 L

## 2020-10-15 NOTE — Progress Notes (Signed)
Full PFT completed today ? ?

## 2020-10-17 DIAGNOSIS — R531 Weakness: Secondary | ICD-10-CM | POA: Diagnosis not present

## 2020-10-19 ENCOUNTER — Telehealth: Payer: Self-pay | Admitting: Cardiology

## 2020-10-19 NOTE — Telephone Encounter (Signed)
Per Dr. Wendy Poet note proper management of her medication anticipate improvement in her kidney function then we can talk about potentially doing stress test and eventually cardiac catheterization. Pt advised to disregard the letter and lexi has been cancelled.

## 2020-10-19 NOTE — Telephone Encounter (Signed)
Molly [name] is calling stating she is receiving letters about her needing a cardiac cath when our office advised her to hold off on doing so. She is requesting a callback in regards to this. Please advise.

## 2020-10-20 ENCOUNTER — Encounter: Payer: Self-pay | Admitting: Podiatry

## 2020-10-20 ENCOUNTER — Other Ambulatory Visit: Payer: Self-pay

## 2020-10-20 ENCOUNTER — Ambulatory Visit: Payer: Medicare Other | Admitting: Podiatry

## 2020-10-20 DIAGNOSIS — E1151 Type 2 diabetes mellitus with diabetic peripheral angiopathy without gangrene: Secondary | ICD-10-CM

## 2020-10-20 DIAGNOSIS — M79674 Pain in right toe(s): Secondary | ICD-10-CM

## 2020-10-20 DIAGNOSIS — M79675 Pain in left toe(s): Secondary | ICD-10-CM

## 2020-10-20 DIAGNOSIS — N179 Acute kidney failure, unspecified: Secondary | ICD-10-CM | POA: Diagnosis not present

## 2020-10-20 DIAGNOSIS — E1122 Type 2 diabetes mellitus with diabetic chronic kidney disease: Secondary | ICD-10-CM | POA: Diagnosis not present

## 2020-10-20 DIAGNOSIS — Z794 Long term (current) use of insulin: Secondary | ICD-10-CM | POA: Diagnosis not present

## 2020-10-20 DIAGNOSIS — B351 Tinea unguium: Secondary | ICD-10-CM

## 2020-10-20 DIAGNOSIS — N184 Chronic kidney disease, stage 4 (severe): Secondary | ICD-10-CM | POA: Diagnosis not present

## 2020-10-20 DIAGNOSIS — I129 Hypertensive chronic kidney disease with stage 1 through stage 4 chronic kidney disease, or unspecified chronic kidney disease: Secondary | ICD-10-CM | POA: Diagnosis not present

## 2020-10-21 ENCOUNTER — Telehealth: Payer: Self-pay | Admitting: Pulmonary Disease

## 2020-10-21 DIAGNOSIS — I739 Peripheral vascular disease, unspecified: Secondary | ICD-10-CM | POA: Diagnosis not present

## 2020-10-21 DIAGNOSIS — D649 Anemia, unspecified: Secondary | ICD-10-CM | POA: Diagnosis not present

## 2020-10-21 DIAGNOSIS — G4733 Obstructive sleep apnea (adult) (pediatric): Secondary | ICD-10-CM | POA: Diagnosis not present

## 2020-10-21 DIAGNOSIS — Z9981 Dependence on supplemental oxygen: Secondary | ICD-10-CM | POA: Diagnosis not present

## 2020-10-21 DIAGNOSIS — J9691 Respiratory failure, unspecified with hypoxia: Secondary | ICD-10-CM | POA: Diagnosis not present

## 2020-10-21 DIAGNOSIS — N1832 Chronic kidney disease, stage 3b: Secondary | ICD-10-CM | POA: Diagnosis not present

## 2020-10-21 DIAGNOSIS — Z7982 Long term (current) use of aspirin: Secondary | ICD-10-CM | POA: Diagnosis not present

## 2020-10-21 DIAGNOSIS — E1122 Type 2 diabetes mellitus with diabetic chronic kidney disease: Secondary | ICD-10-CM | POA: Diagnosis not present

## 2020-10-21 DIAGNOSIS — E559 Vitamin D deficiency, unspecified: Secondary | ICD-10-CM | POA: Diagnosis not present

## 2020-10-21 DIAGNOSIS — E119 Type 2 diabetes mellitus without complications: Secondary | ICD-10-CM | POA: Diagnosis not present

## 2020-10-21 DIAGNOSIS — I130 Hypertensive heart and chronic kidney disease with heart failure and stage 1 through stage 4 chronic kidney disease, or unspecified chronic kidney disease: Secondary | ICD-10-CM | POA: Diagnosis not present

## 2020-10-21 DIAGNOSIS — E1151 Type 2 diabetes mellitus with diabetic peripheral angiopathy without gangrene: Secondary | ICD-10-CM | POA: Diagnosis not present

## 2020-10-21 DIAGNOSIS — K219 Gastro-esophageal reflux disease without esophagitis: Secondary | ICD-10-CM | POA: Diagnosis not present

## 2020-10-21 DIAGNOSIS — I5032 Chronic diastolic (congestive) heart failure: Secondary | ICD-10-CM | POA: Diagnosis not present

## 2020-10-21 DIAGNOSIS — Z794 Long term (current) use of insulin: Secondary | ICD-10-CM | POA: Diagnosis not present

## 2020-10-21 DIAGNOSIS — E785 Hyperlipidemia, unspecified: Secondary | ICD-10-CM | POA: Diagnosis not present

## 2020-10-21 NOTE — Telephone Encounter (Signed)
PFTs are consistent with hyperinflation and gas trapping. This can be seen with emphysema but reviewed her recent CT scan and do not see emphysema. Possibly related to asthma or other small airways disease but the reduced DLCO does not fit. Overall, the results are not consistent with a unifying diagnosis. How are her oxygen saturations? Is she still using oxygen?

## 2020-10-21 NOTE — Telephone Encounter (Signed)
Call returned to patient, confirmed DOB. Made aware we would get this information to MD Hunsucker.   MH please advise on PFT results.   Thanks :)

## 2020-10-22 NOTE — Telephone Encounter (Signed)
Call made to patient, confirmed DOB. Made aware of MH recommendations. She report she has not been using the oxygen since her office visit. Her oxygen saturations remain in the 90's. She said since her last office visit she has had only 2 episodes when she needed to stop and catch her breath. She reports she feels good and does not have any concerns at this point.  Nothing further needed at this time.

## 2020-10-23 NOTE — Progress Notes (Signed)
  Subjective:  Patient ID: Molly [name], female    DOB: 08/16/1950,  MRN: [id number]  70 y.o. female presents with at risk foot care. Pt has h/o NIDDM with PAD and painful thick toenails that are difficult to trim. Pain interferes with ambulation. Aggravating factors include wearing enclosed shoe gear. Pain is relieved with periodic professional debridement..    She voices no new pedal problems on today's visit. She states she was hospitalized for heart issues in May.  PCP: [name], MD and last visit was:   Review of Systems: Negative except as noted in the HPI.   Allergies  Allergen Reactions   Lisinopril Other (See Comments)   Mercury Nausea And Vomiting    Objective:  There were no vitals filed for this visit. Constitutional Patient is a pleasant 70 y.o. African American female in NAD. AAO x 3.  Vascular Capillary fill time to digits <3 seconds b/l lower extremities. Nonpalpable DP pulse(s) b/l lower extremities. Nonpalpable PT pulse(s) b/l lower extremities. Pedal hair absent. Lower extremity skin temperature gradient within normal limits. No pain with calf compression b/l. No cyanosis or clubbing noted.  Neurologic Normal speech. Protective sensation intact 5/5 intact bilaterally with 10g monofilament b/l. Vibratory sensation intact b/l.  Dermatologic Pedal skin with normal turgor, texture and tone b/l lower extremities No open wounds b/l lower extremities No interdigital macerations b/l lower extremities Toenails 1-5 b/l elongated, discolored, dystrophic, thickened, crumbly with subungual debris and tenderness to dorsal palpation.  Orthopedic: Normal muscle strength 5/5 to all lower extremity muscle groups bilaterally. No pain crepitus or joint limitation noted with ROM b/l. No gross bony deformities bilaterally.   Hemoglobin A1C Latest Ref Rng & Units 08/31/2020  HGBA1C 4.8 - 5.6 % 7.0(H)  Some recent data might be hidden   Assessment:   1. Pain due to onychomycosis  of toenails of both feet   2. Type II diabetes mellitus with peripheral circulatory disorder (HCC)    Plan:   -Examined patient. -Continue diabetic foot care principles. -Patient to continue soft, supportive shoe gear daily. -Toenails 1-5 b/l were debrided in length and girth with sterile nail nippers and dremel without iatrogenic bleeding.  -Patient to report any pedal injuries to medical professional immediately. -Patient/POA to call should there be question/concern in the interim.  Return in about 3 months (around 01/20/2021) for diabetic nail trim.  Marzetta Board, DPM

## 2020-10-25 DIAGNOSIS — E785 Hyperlipidemia, unspecified: Secondary | ICD-10-CM | POA: Diagnosis not present

## 2020-10-25 DIAGNOSIS — Z9981 Dependence on supplemental oxygen: Secondary | ICD-10-CM | POA: Diagnosis not present

## 2020-10-25 DIAGNOSIS — I130 Hypertensive heart and chronic kidney disease with heart failure and stage 1 through stage 4 chronic kidney disease, or unspecified chronic kidney disease: Secondary | ICD-10-CM | POA: Diagnosis not present

## 2020-10-25 DIAGNOSIS — J9691 Respiratory failure, unspecified with hypoxia: Secondary | ICD-10-CM | POA: Diagnosis not present

## 2020-10-25 DIAGNOSIS — I739 Peripheral vascular disease, unspecified: Secondary | ICD-10-CM | POA: Diagnosis not present

## 2020-10-25 DIAGNOSIS — Z794 Long term (current) use of insulin: Secondary | ICD-10-CM | POA: Diagnosis not present

## 2020-10-25 DIAGNOSIS — E1151 Type 2 diabetes mellitus with diabetic peripheral angiopathy without gangrene: Secondary | ICD-10-CM | POA: Diagnosis not present

## 2020-10-25 DIAGNOSIS — D649 Anemia, unspecified: Secondary | ICD-10-CM | POA: Diagnosis not present

## 2020-10-25 DIAGNOSIS — G4733 Obstructive sleep apnea (adult) (pediatric): Secondary | ICD-10-CM | POA: Diagnosis not present

## 2020-10-25 DIAGNOSIS — E559 Vitamin D deficiency, unspecified: Secondary | ICD-10-CM | POA: Diagnosis not present

## 2020-10-25 DIAGNOSIS — E1122 Type 2 diabetes mellitus with diabetic chronic kidney disease: Secondary | ICD-10-CM | POA: Diagnosis not present

## 2020-10-25 DIAGNOSIS — K219 Gastro-esophageal reflux disease without esophagitis: Secondary | ICD-10-CM | POA: Diagnosis not present

## 2020-10-25 DIAGNOSIS — I5032 Chronic diastolic (congestive) heart failure: Secondary | ICD-10-CM | POA: Diagnosis not present

## 2020-10-25 DIAGNOSIS — N1832 Chronic kidney disease, stage 3b: Secondary | ICD-10-CM | POA: Diagnosis not present

## 2020-10-25 DIAGNOSIS — Z7982 Long term (current) use of aspirin: Secondary | ICD-10-CM | POA: Diagnosis not present

## 2020-10-26 DIAGNOSIS — E1151 Type 2 diabetes mellitus with diabetic peripheral angiopathy without gangrene: Secondary | ICD-10-CM | POA: Diagnosis not present

## 2020-10-26 DIAGNOSIS — J9691 Respiratory failure, unspecified with hypoxia: Secondary | ICD-10-CM | POA: Diagnosis not present

## 2020-10-26 DIAGNOSIS — E1122 Type 2 diabetes mellitus with diabetic chronic kidney disease: Secondary | ICD-10-CM | POA: Diagnosis not present

## 2020-10-26 DIAGNOSIS — Z794 Long term (current) use of insulin: Secondary | ICD-10-CM | POA: Diagnosis not present

## 2020-10-26 DIAGNOSIS — I130 Hypertensive heart and chronic kidney disease with heart failure and stage 1 through stage 4 chronic kidney disease, or unspecified chronic kidney disease: Secondary | ICD-10-CM | POA: Diagnosis not present

## 2020-10-26 DIAGNOSIS — N1832 Chronic kidney disease, stage 3b: Secondary | ICD-10-CM | POA: Diagnosis not present

## 2020-10-26 DIAGNOSIS — G4733 Obstructive sleep apnea (adult) (pediatric): Secondary | ICD-10-CM | POA: Diagnosis not present

## 2020-10-26 DIAGNOSIS — I739 Peripheral vascular disease, unspecified: Secondary | ICD-10-CM | POA: Diagnosis not present

## 2020-10-26 DIAGNOSIS — Z9981 Dependence on supplemental oxygen: Secondary | ICD-10-CM | POA: Diagnosis not present

## 2020-10-26 DIAGNOSIS — K219 Gastro-esophageal reflux disease without esophagitis: Secondary | ICD-10-CM | POA: Diagnosis not present

## 2020-10-26 DIAGNOSIS — I5032 Chronic diastolic (congestive) heart failure: Secondary | ICD-10-CM | POA: Diagnosis not present

## 2020-10-26 DIAGNOSIS — E559 Vitamin D deficiency, unspecified: Secondary | ICD-10-CM | POA: Diagnosis not present

## 2020-10-26 DIAGNOSIS — Z7982 Long term (current) use of aspirin: Secondary | ICD-10-CM | POA: Diagnosis not present

## 2020-10-26 DIAGNOSIS — D649 Anemia, unspecified: Secondary | ICD-10-CM | POA: Diagnosis not present

## 2020-10-26 DIAGNOSIS — E785 Hyperlipidemia, unspecified: Secondary | ICD-10-CM | POA: Diagnosis not present

## 2020-10-28 ENCOUNTER — Telehealth: Payer: Self-pay | Admitting: Cardiology

## 2020-10-28 MED ORDER — EZETIMIBE 10 MG PO TABS: 10.0000 mg | ORAL_TABLET | Freq: Every day | ORAL | 2 refills | Status: AC

## 2020-10-28 MED ORDER — CARVEDILOL 25 MG PO TABS: 25.0000 mg | ORAL_TABLET | Freq: Two times a day (BID) | ORAL | 2 refills | Status: DC

## 2020-10-28 MED ORDER — HYDRALAZINE HCL 25 MG PO TABS: 25.0000 mg | ORAL_TABLET | Freq: Two times a day (BID) | ORAL | 2 refills | Status: DC

## 2020-10-28 NOTE — Telephone Encounter (Signed)
*  STAT* If patient is at the pharmacy, call can be transferred to refill team.   1. Which medications need to be refilled? (please list name of each medication and dose if known)  carvedilol (COREG) 25 MG tablet ezetimibe (ZETIA) 10 MG tablet hydrALAZINE (APRESOLINE) 25 MG tablet  2. Which pharmacy/location (including street and city if local pharmacy) is medication to be sent to? [facility], [place] - [address] AT [address]  3. Do they need a 30 day or 90 day supply? 90 day

## 2020-10-28 NOTE — Telephone Encounter (Signed)
Refill of Hydralazine 25 mg, Carvedilol 25 mg, and Zetia 10 mg sent to [facility] and [facility].

## 2020-11-01 DIAGNOSIS — E119 Type 2 diabetes mellitus without complications: Secondary | ICD-10-CM | POA: Diagnosis not present

## 2020-11-01 DIAGNOSIS — N1832 Chronic kidney disease, stage 3b: Secondary | ICD-10-CM | POA: Diagnosis not present

## 2020-11-01 DIAGNOSIS — J9691 Respiratory failure, unspecified with hypoxia: Secondary | ICD-10-CM | POA: Diagnosis not present

## 2020-11-01 DIAGNOSIS — E1151 Type 2 diabetes mellitus with diabetic peripheral angiopathy without gangrene: Secondary | ICD-10-CM | POA: Diagnosis not present

## 2020-11-01 DIAGNOSIS — Z9981 Dependence on supplemental oxygen: Secondary | ICD-10-CM | POA: Diagnosis not present

## 2020-11-01 DIAGNOSIS — G4733 Obstructive sleep apnea (adult) (pediatric): Secondary | ICD-10-CM | POA: Diagnosis not present

## 2020-11-01 DIAGNOSIS — I130 Hypertensive heart and chronic kidney disease with heart failure and stage 1 through stage 4 chronic kidney disease, or unspecified chronic kidney disease: Secondary | ICD-10-CM | POA: Diagnosis not present

## 2020-11-01 DIAGNOSIS — D649 Anemia, unspecified: Secondary | ICD-10-CM | POA: Diagnosis not present

## 2020-11-01 DIAGNOSIS — K219 Gastro-esophageal reflux disease without esophagitis: Secondary | ICD-10-CM | POA: Diagnosis not present

## 2020-11-01 DIAGNOSIS — E559 Vitamin D deficiency, unspecified: Secondary | ICD-10-CM | POA: Diagnosis not present

## 2020-11-01 DIAGNOSIS — Z7982 Long term (current) use of aspirin: Secondary | ICD-10-CM | POA: Diagnosis not present

## 2020-11-01 DIAGNOSIS — I5032 Chronic diastolic (congestive) heart failure: Secondary | ICD-10-CM | POA: Diagnosis not present

## 2020-11-01 DIAGNOSIS — E785 Hyperlipidemia, unspecified: Secondary | ICD-10-CM | POA: Diagnosis not present

## 2020-11-01 DIAGNOSIS — Z794 Long term (current) use of insulin: Secondary | ICD-10-CM | POA: Diagnosis not present

## 2020-11-01 DIAGNOSIS — I739 Peripheral vascular disease, unspecified: Secondary | ICD-10-CM | POA: Diagnosis not present

## 2020-11-01 DIAGNOSIS — E1122 Type 2 diabetes mellitus with diabetic chronic kidney disease: Secondary | ICD-10-CM | POA: Diagnosis not present

## 2020-11-08 DIAGNOSIS — K219 Gastro-esophageal reflux disease without esophagitis: Secondary | ICD-10-CM | POA: Diagnosis not present

## 2020-11-08 DIAGNOSIS — Z7982 Long term (current) use of aspirin: Secondary | ICD-10-CM | POA: Diagnosis not present

## 2020-11-08 DIAGNOSIS — Z794 Long term (current) use of insulin: Secondary | ICD-10-CM | POA: Diagnosis not present

## 2020-11-08 DIAGNOSIS — E1151 Type 2 diabetes mellitus with diabetic peripheral angiopathy without gangrene: Secondary | ICD-10-CM | POA: Diagnosis not present

## 2020-11-08 DIAGNOSIS — D649 Anemia, unspecified: Secondary | ICD-10-CM | POA: Diagnosis not present

## 2020-11-08 DIAGNOSIS — Z9981 Dependence on supplemental oxygen: Secondary | ICD-10-CM | POA: Diagnosis not present

## 2020-11-08 DIAGNOSIS — J9691 Respiratory failure, unspecified with hypoxia: Secondary | ICD-10-CM | POA: Diagnosis not present

## 2020-11-08 DIAGNOSIS — E1122 Type 2 diabetes mellitus with diabetic chronic kidney disease: Secondary | ICD-10-CM | POA: Diagnosis not present

## 2020-11-08 DIAGNOSIS — E785 Hyperlipidemia, unspecified: Secondary | ICD-10-CM | POA: Diagnosis not present

## 2020-11-08 DIAGNOSIS — I130 Hypertensive heart and chronic kidney disease with heart failure and stage 1 through stage 4 chronic kidney disease, or unspecified chronic kidney disease: Secondary | ICD-10-CM | POA: Diagnosis not present

## 2020-11-08 DIAGNOSIS — N1832 Chronic kidney disease, stage 3b: Secondary | ICD-10-CM | POA: Diagnosis not present

## 2020-11-08 DIAGNOSIS — I739 Peripheral vascular disease, unspecified: Secondary | ICD-10-CM | POA: Diagnosis not present

## 2020-11-08 DIAGNOSIS — I5032 Chronic diastolic (congestive) heart failure: Secondary | ICD-10-CM | POA: Diagnosis not present

## 2020-11-08 DIAGNOSIS — E559 Vitamin D deficiency, unspecified: Secondary | ICD-10-CM | POA: Diagnosis not present

## 2020-11-08 DIAGNOSIS — G4733 Obstructive sleep apnea (adult) (pediatric): Secondary | ICD-10-CM | POA: Diagnosis not present

## 2020-11-12 DIAGNOSIS — K219 Gastro-esophageal reflux disease without esophagitis: Secondary | ICD-10-CM | POA: Diagnosis not present

## 2020-11-12 DIAGNOSIS — D649 Anemia, unspecified: Secondary | ICD-10-CM | POA: Diagnosis not present

## 2020-11-12 DIAGNOSIS — I130 Hypertensive heart and chronic kidney disease with heart failure and stage 1 through stage 4 chronic kidney disease, or unspecified chronic kidney disease: Secondary | ICD-10-CM | POA: Diagnosis not present

## 2020-11-12 DIAGNOSIS — E559 Vitamin D deficiency, unspecified: Secondary | ICD-10-CM | POA: Diagnosis not present

## 2020-11-12 DIAGNOSIS — Z7982 Long term (current) use of aspirin: Secondary | ICD-10-CM | POA: Diagnosis not present

## 2020-11-12 DIAGNOSIS — Z794 Long term (current) use of insulin: Secondary | ICD-10-CM | POA: Diagnosis not present

## 2020-11-12 DIAGNOSIS — E1122 Type 2 diabetes mellitus with diabetic chronic kidney disease: Secondary | ICD-10-CM | POA: Diagnosis not present

## 2020-11-12 DIAGNOSIS — I739 Peripheral vascular disease, unspecified: Secondary | ICD-10-CM | POA: Diagnosis not present

## 2020-11-12 DIAGNOSIS — I5032 Chronic diastolic (congestive) heart failure: Secondary | ICD-10-CM | POA: Diagnosis not present

## 2020-11-12 DIAGNOSIS — J9691 Respiratory failure, unspecified with hypoxia: Secondary | ICD-10-CM | POA: Diagnosis not present

## 2020-11-12 DIAGNOSIS — E1151 Type 2 diabetes mellitus with diabetic peripheral angiopathy without gangrene: Secondary | ICD-10-CM | POA: Diagnosis not present

## 2020-11-12 DIAGNOSIS — E785 Hyperlipidemia, unspecified: Secondary | ICD-10-CM | POA: Diagnosis not present

## 2020-11-12 DIAGNOSIS — Z9981 Dependence on supplemental oxygen: Secondary | ICD-10-CM | POA: Diagnosis not present

## 2020-11-12 DIAGNOSIS — N1832 Chronic kidney disease, stage 3b: Secondary | ICD-10-CM | POA: Diagnosis not present

## 2020-11-12 DIAGNOSIS — G4733 Obstructive sleep apnea (adult) (pediatric): Secondary | ICD-10-CM | POA: Diagnosis not present

## 2020-11-15 DIAGNOSIS — E119 Type 2 diabetes mellitus without complications: Secondary | ICD-10-CM | POA: Diagnosis not present

## 2020-11-16 DIAGNOSIS — E559 Vitamin D deficiency, unspecified: Secondary | ICD-10-CM | POA: Diagnosis not present

## 2020-11-16 DIAGNOSIS — Z794 Long term (current) use of insulin: Secondary | ICD-10-CM | POA: Diagnosis not present

## 2020-11-16 DIAGNOSIS — N1832 Chronic kidney disease, stage 3b: Secondary | ICD-10-CM | POA: Diagnosis not present

## 2020-11-16 DIAGNOSIS — I5032 Chronic diastolic (congestive) heart failure: Secondary | ICD-10-CM | POA: Diagnosis not present

## 2020-11-16 DIAGNOSIS — Z9181 History of falling: Secondary | ICD-10-CM | POA: Diagnosis not present

## 2020-11-16 DIAGNOSIS — E785 Hyperlipidemia, unspecified: Secondary | ICD-10-CM | POA: Diagnosis not present

## 2020-11-16 DIAGNOSIS — I130 Hypertensive heart and chronic kidney disease with heart failure and stage 1 through stage 4 chronic kidney disease, or unspecified chronic kidney disease: Secondary | ICD-10-CM | POA: Diagnosis not present

## 2020-11-16 DIAGNOSIS — G4733 Obstructive sleep apnea (adult) (pediatric): Secondary | ICD-10-CM | POA: Diagnosis not present

## 2020-11-16 DIAGNOSIS — E1151 Type 2 diabetes mellitus with diabetic peripheral angiopathy without gangrene: Secondary | ICD-10-CM | POA: Diagnosis not present

## 2020-11-16 DIAGNOSIS — E1122 Type 2 diabetes mellitus with diabetic chronic kidney disease: Secondary | ICD-10-CM | POA: Diagnosis not present

## 2020-11-16 DIAGNOSIS — Z7982 Long term (current) use of aspirin: Secondary | ICD-10-CM | POA: Diagnosis not present

## 2020-11-16 DIAGNOSIS — K219 Gastro-esophageal reflux disease without esophagitis: Secondary | ICD-10-CM | POA: Diagnosis not present

## 2020-11-16 DIAGNOSIS — D631 Anemia in chronic kidney disease: Secondary | ICD-10-CM | POA: Diagnosis not present

## 2020-11-19 DIAGNOSIS — I10 Essential (primary) hypertension: Secondary | ICD-10-CM | POA: Diagnosis not present

## 2020-11-19 DIAGNOSIS — I119 Hypertensive heart disease without heart failure: Secondary | ICD-10-CM | POA: Diagnosis not present

## 2020-11-19 DIAGNOSIS — E1165 Type 2 diabetes mellitus with hyperglycemia: Secondary | ICD-10-CM | POA: Diagnosis not present

## 2020-11-19 DIAGNOSIS — E119 Type 2 diabetes mellitus without complications: Secondary | ICD-10-CM | POA: Diagnosis not present

## 2020-11-19 DIAGNOSIS — N189 Chronic kidney disease, unspecified: Secondary | ICD-10-CM | POA: Diagnosis not present

## 2020-11-19 DIAGNOSIS — I5032 Chronic diastolic (congestive) heart failure: Secondary | ICD-10-CM | POA: Diagnosis not present

## 2020-11-19 DIAGNOSIS — I2510 Atherosclerotic heart disease of native coronary artery without angina pectoris: Secondary | ICD-10-CM | POA: Diagnosis not present

## 2020-11-19 DIAGNOSIS — E1169 Type 2 diabetes mellitus with other specified complication: Secondary | ICD-10-CM | POA: Diagnosis not present

## 2020-11-19 DIAGNOSIS — N184 Chronic kidney disease, stage 4 (severe): Secondary | ICD-10-CM | POA: Diagnosis not present

## 2020-11-19 DIAGNOSIS — K219 Gastro-esophageal reflux disease without esophagitis: Secondary | ICD-10-CM | POA: Diagnosis not present

## 2020-11-23 DIAGNOSIS — N1832 Chronic kidney disease, stage 3b: Secondary | ICD-10-CM | POA: Diagnosis not present

## 2020-11-23 DIAGNOSIS — G4733 Obstructive sleep apnea (adult) (pediatric): Secondary | ICD-10-CM | POA: Diagnosis not present

## 2020-11-23 DIAGNOSIS — E559 Vitamin D deficiency, unspecified: Secondary | ICD-10-CM | POA: Diagnosis not present

## 2020-11-23 DIAGNOSIS — E1151 Type 2 diabetes mellitus with diabetic peripheral angiopathy without gangrene: Secondary | ICD-10-CM | POA: Diagnosis not present

## 2020-11-23 DIAGNOSIS — E785 Hyperlipidemia, unspecified: Secondary | ICD-10-CM | POA: Diagnosis not present

## 2020-11-23 DIAGNOSIS — Z9181 History of falling: Secondary | ICD-10-CM | POA: Diagnosis not present

## 2020-11-23 DIAGNOSIS — Z7982 Long term (current) use of aspirin: Secondary | ICD-10-CM | POA: Diagnosis not present

## 2020-11-23 DIAGNOSIS — E1122 Type 2 diabetes mellitus with diabetic chronic kidney disease: Secondary | ICD-10-CM | POA: Diagnosis not present

## 2020-11-23 DIAGNOSIS — D631 Anemia in chronic kidney disease: Secondary | ICD-10-CM | POA: Diagnosis not present

## 2020-11-23 DIAGNOSIS — I5032 Chronic diastolic (congestive) heart failure: Secondary | ICD-10-CM | POA: Diagnosis not present

## 2020-11-23 DIAGNOSIS — Z794 Long term (current) use of insulin: Secondary | ICD-10-CM | POA: Diagnosis not present

## 2020-11-23 DIAGNOSIS — K219 Gastro-esophageal reflux disease without esophagitis: Secondary | ICD-10-CM | POA: Diagnosis not present

## 2020-11-23 DIAGNOSIS — I130 Hypertensive heart and chronic kidney disease with heart failure and stage 1 through stage 4 chronic kidney disease, or unspecified chronic kidney disease: Secondary | ICD-10-CM | POA: Diagnosis not present

## 2020-11-25 ENCOUNTER — Other Ambulatory Visit: Payer: Self-pay

## 2020-11-26 ENCOUNTER — Encounter: Payer: Self-pay | Admitting: Cardiology

## 2020-11-26 ENCOUNTER — Ambulatory Visit: Payer: Medicare Other | Admitting: Cardiology

## 2020-11-26 ENCOUNTER — Other Ambulatory Visit: Payer: Self-pay

## 2020-11-26 ENCOUNTER — Encounter (HOSPITAL_COMMUNITY): Payer: Self-pay | Admitting: Internal Medicine

## 2020-11-26 ENCOUNTER — Inpatient Hospital Stay (HOSPITAL_BASED_OUTPATIENT_CLINIC_OR_DEPARTMENT_OTHER)
Admission: EM | Admit: 2020-11-26 | Discharge: 2020-12-03 | DRG: 291 | Disposition: A | Discharge status: Home Health | Payer: Medicare Other | Attending: Internal Medicine | Admitting: Internal Medicine

## 2020-11-26 ENCOUNTER — Emergency Department (HOSPITAL_BASED_OUTPATIENT_CLINIC_OR_DEPARTMENT_OTHER): Payer: Medicare Other

## 2020-11-26 VITALS — BP 210/90 | HR 74 | Ht 62.0 in | Wt 169.0 lb

## 2020-11-26 DIAGNOSIS — N184 Chronic kidney disease, stage 4 (severe): Secondary | ICD-10-CM

## 2020-11-26 DIAGNOSIS — E11649 Type 2 diabetes mellitus with hypoglycemia without coma: Secondary | ICD-10-CM | POA: Diagnosis present

## 2020-11-26 DIAGNOSIS — Z20822 Contact with and (suspected) exposure to covid-19: Secondary | ICD-10-CM | POA: Diagnosis not present

## 2020-11-26 DIAGNOSIS — Z794 Long term (current) use of insulin: Secondary | ICD-10-CM

## 2020-11-26 DIAGNOSIS — Z8249 Family history of ischemic heart disease and other diseases of the circulatory system: Secondary | ICD-10-CM

## 2020-11-26 DIAGNOSIS — J9622 Acute and chronic respiratory failure with hypercapnia: Secondary | ICD-10-CM | POA: Diagnosis not present

## 2020-11-26 DIAGNOSIS — J189 Pneumonia, unspecified organism: Secondary | ICD-10-CM | POA: Diagnosis not present

## 2020-11-26 DIAGNOSIS — I739 Peripheral vascular disease, unspecified: Secondary | ICD-10-CM | POA: Diagnosis not present

## 2020-11-26 DIAGNOSIS — Z7189 Other specified counseling: Secondary | ICD-10-CM | POA: Diagnosis not present

## 2020-11-26 DIAGNOSIS — I160 Hypertensive urgency: Secondary | ICD-10-CM | POA: Diagnosis present

## 2020-11-26 DIAGNOSIS — E119 Type 2 diabetes mellitus without complications: Secondary | ICD-10-CM

## 2020-11-26 DIAGNOSIS — R0600 Dyspnea, unspecified: Secondary | ICD-10-CM | POA: Diagnosis not present

## 2020-11-26 DIAGNOSIS — J9601 Acute respiratory failure with hypoxia: Secondary | ICD-10-CM | POA: Diagnosis not present

## 2020-11-26 DIAGNOSIS — R0602 Shortness of breath: Secondary | ICD-10-CM | POA: Diagnosis not present

## 2020-11-26 DIAGNOSIS — R3121 Asymptomatic microscopic hematuria: Secondary | ICD-10-CM | POA: Diagnosis not present

## 2020-11-26 DIAGNOSIS — J9621 Acute and chronic respiratory failure with hypoxia: Secondary | ICD-10-CM | POA: Diagnosis not present

## 2020-11-26 DIAGNOSIS — K219 Gastro-esophageal reflux disease without esophagitis: Secondary | ICD-10-CM | POA: Diagnosis not present

## 2020-11-26 DIAGNOSIS — E1140 Type 2 diabetes mellitus with diabetic neuropathy, unspecified: Secondary | ICD-10-CM | POA: Diagnosis present

## 2020-11-26 DIAGNOSIS — N183 Acute kidney failure, unspecified: Secondary | ICD-10-CM

## 2020-11-26 DIAGNOSIS — E872 Acidosis: Secondary | ICD-10-CM | POA: Diagnosis not present

## 2020-11-26 DIAGNOSIS — I4891 Unspecified atrial fibrillation: Secondary | ICD-10-CM | POA: Diagnosis not present

## 2020-11-26 DIAGNOSIS — E7800 Pure hypercholesterolemia, unspecified: Secondary | ICD-10-CM | POA: Diagnosis present

## 2020-11-26 DIAGNOSIS — G253 Myoclonus: Secondary | ICD-10-CM | POA: Diagnosis not present

## 2020-11-26 DIAGNOSIS — N179 Acute kidney failure, unspecified: Secondary | ICD-10-CM

## 2020-11-26 DIAGNOSIS — R278 Other lack of coordination: Secondary | ICD-10-CM | POA: Diagnosis not present

## 2020-11-26 DIAGNOSIS — I10 Essential (primary) hypertension: Secondary | ICD-10-CM

## 2020-11-26 DIAGNOSIS — I5033 Acute on chronic diastolic (congestive) heart failure: Secondary | ICD-10-CM | POA: Diagnosis not present

## 2020-11-26 DIAGNOSIS — I272 Pulmonary hypertension, unspecified: Secondary | ICD-10-CM | POA: Diagnosis not present

## 2020-11-26 DIAGNOSIS — E1151 Type 2 diabetes mellitus with diabetic peripheral angiopathy without gangrene: Secondary | ICD-10-CM | POA: Diagnosis not present

## 2020-11-26 DIAGNOSIS — J9602 Acute respiratory failure with hypercapnia: Secondary | ICD-10-CM | POA: Diagnosis not present

## 2020-11-26 DIAGNOSIS — M8580 Other specified disorders of bone density and structure, unspecified site: Secondary | ICD-10-CM | POA: Diagnosis not present

## 2020-11-26 DIAGNOSIS — G9341 Metabolic encephalopathy: Secondary | ICD-10-CM | POA: Diagnosis not present

## 2020-11-26 DIAGNOSIS — R001 Bradycardia, unspecified: Secondary | ICD-10-CM | POA: Diagnosis not present

## 2020-11-26 DIAGNOSIS — E1165 Type 2 diabetes mellitus with hyperglycemia: Secondary | ICD-10-CM | POA: Diagnosis not present

## 2020-11-26 DIAGNOSIS — E1122 Type 2 diabetes mellitus with diabetic chronic kidney disease: Secondary | ICD-10-CM | POA: Diagnosis present

## 2020-11-26 DIAGNOSIS — R7989 Other specified abnormal findings of blood chemistry: Secondary | ICD-10-CM

## 2020-11-26 DIAGNOSIS — N186 End stage renal disease: Secondary | ICD-10-CM | POA: Diagnosis not present

## 2020-11-26 DIAGNOSIS — I2510 Atherosclerotic heart disease of native coronary artery without angina pectoris: Secondary | ICD-10-CM | POA: Diagnosis not present

## 2020-11-26 DIAGNOSIS — D631 Anemia in chronic kidney disease: Secondary | ICD-10-CM | POA: Diagnosis not present

## 2020-11-26 DIAGNOSIS — I169 Hypertensive crisis, unspecified: Secondary | ICD-10-CM | POA: Diagnosis not present

## 2020-11-26 DIAGNOSIS — E876 Hypokalemia: Secondary | ICD-10-CM | POA: Diagnosis not present

## 2020-11-26 DIAGNOSIS — Z79899 Other long term (current) drug therapy: Secondary | ICD-10-CM

## 2020-11-26 DIAGNOSIS — R0609 Other forms of dyspnea: Secondary | ICD-10-CM

## 2020-11-26 DIAGNOSIS — I130 Hypertensive heart and chronic kidney disease with heart failure and stage 1 through stage 4 chronic kidney disease, or unspecified chronic kidney disease: Secondary | ICD-10-CM | POA: Diagnosis not present

## 2020-11-26 DIAGNOSIS — E1129 Type 2 diabetes mellitus with other diabetic kidney complication: Secondary | ICD-10-CM | POA: Diagnosis not present

## 2020-11-26 DIAGNOSIS — N1832 Chronic kidney disease, stage 3b: Secondary | ICD-10-CM | POA: Diagnosis not present

## 2020-11-26 DIAGNOSIS — N189 Chronic kidney disease, unspecified: Secondary | ICD-10-CM | POA: Diagnosis not present

## 2020-11-26 DIAGNOSIS — I509 Heart failure, unspecified: Secondary | ICD-10-CM

## 2020-11-26 DIAGNOSIS — R9431 Abnormal electrocardiogram [ECG] [EKG]: Secondary | ICD-10-CM

## 2020-11-26 DIAGNOSIS — Z515 Encounter for palliative care: Secondary | ICD-10-CM | POA: Diagnosis not present

## 2020-11-26 DIAGNOSIS — R609 Edema, unspecified: Secondary | ICD-10-CM | POA: Diagnosis not present

## 2020-11-26 DIAGNOSIS — Z888 Allergy status to other drugs, medicaments and biological substances status: Secondary | ICD-10-CM

## 2020-11-26 LAB — COMPREHENSIVE METABOLIC PANEL
ALT: 80 U/L — ABNORMAL HIGH (ref 0–44)
AST: 59 U/L — ABNORMAL HIGH (ref 15–41)
Albumin: 4.1 g/dL (ref 3.5–5.0)
Alkaline Phosphatase: 66 U/L (ref 38–126)
Anion gap: 11 (ref 5–15)
BUN: 48 mg/dL — ABNORMAL HIGH (ref 8–23)
CO2: 27 mmol/L (ref 22–32)
Calcium: 8.7 mg/dL — ABNORMAL LOW (ref 8.9–10.3)
Chloride: 101 mmol/L (ref 98–111)
Creatinine, Ser: 2.77 mg/dL — ABNORMAL HIGH (ref 0.44–1.00)
GFR, Estimated: 18 mL/min — ABNORMAL LOW (ref >60)
Glucose, Bld: 56 mg/dL — ABNORMAL LOW (ref 70–99)
Potassium: 4.3 mmol/L (ref 3.5–5.1)
Sodium: 139 mmol/L (ref 135–145)
Total Bilirubin: 0.5 mg/dL (ref 0.3–1.2)
Total Protein: 7.6 g/dL (ref 6.5–8.1)

## 2020-11-26 LAB — CBC WITH DIFFERENTIAL/PLATELET
Abs Immature Granulocytes: 0.01 10*3/uL (ref 0.00–0.07)
Basophils Absolute: 0 10*3/uL (ref 0.0–0.1)
Basophils Relative: 1 %
Eosinophils Absolute: 0.1 10*3/uL (ref 0.0–0.5)
Eosinophils Relative: 3 %
HCT: 39.1 % (ref 36.0–46.0)
Hemoglobin: 11.7 g/dL — ABNORMAL LOW (ref 12.0–15.0)
Immature Granulocytes: 0 %
Lymphocytes Relative: 25 %
Lymphs Abs: 1.3 10*3/uL (ref 0.7–4.0)
MCH: 27.6 pg (ref 26.0–34.0)
MCHC: 29.9 g/dL — ABNORMAL LOW (ref 30.0–36.0)
MCV: 92.2 fL (ref 80.0–100.0)
Monocytes Absolute: 0.5 10*3/uL (ref 0.1–1.0)
Monocytes Relative: 10 %
Neutro Abs: 3.2 10*3/uL (ref 1.7–7.7)
Neutrophils Relative %: 61 %
Platelets: 228 10*3/uL (ref 150–400)
RBC: 4.24 MIL/uL (ref 3.87–5.11)
RDW: 15.4 % (ref 11.5–15.5)
WBC: 5.2 10*3/uL (ref 4.0–10.5)
nRBC: 0.4 % — ABNORMAL HIGH (ref 0.0–0.2)

## 2020-11-26 LAB — I-STAT ARTERIAL BLOOD GAS, ED
Acid-Base Excess: 0 mmol/L (ref 0.0–2.0)
Acid-Base Excess: 0 mmol/L (ref 0.0–2.0)
Bicarbonate: 28.6 mmol/L — ABNORMAL HIGH (ref 20.0–28.0)
Bicarbonate: 28.2 mmol/L — ABNORMAL HIGH (ref 20.0–28.0)
Calcium, Ion: 1.25 mmol/L (ref 1.15–1.40)
Calcium, Ion: 1.23 mmol/L (ref 1.15–1.40)
HCT: 36 % (ref 36.0–46.0)
HCT: 35 % — ABNORMAL LOW (ref 36.0–46.0)
Hemoglobin: 12.2 g/dL (ref 12.0–15.0)
Hemoglobin: 11.9 g/dL — ABNORMAL LOW (ref 12.0–15.0)
O2 Saturation: 98 %
O2 Saturation: 95 %
Patient temperature: 98.6
Patient temperature: 98.2
Potassium: 4.2 mmol/L (ref 3.5–5.1)
Potassium: 4.3 mmol/L (ref 3.5–5.1)
Sodium: 141 mmol/L (ref 135–145)
Sodium: 141 mmol/L (ref 135–145)
TCO2: 31 mmol/L (ref 22–32)
TCO2: 30 mmol/L (ref 22–32)
pCO2 arterial: 64.1 mmHg — ABNORMAL HIGH (ref 32.0–48.0)
pCO2 arterial: 65 mmHg — ABNORMAL HIGH (ref 32.0–48.0)
pH, Arterial: 7.258 — ABNORMAL LOW (ref 7.350–7.450)
pH, Arterial: 7.244 — ABNORMAL LOW (ref 7.350–7.450)
pO2, Arterial: 119 mmHg — ABNORMAL HIGH (ref 83.0–108.0)
pO2, Arterial: 88 mmHg (ref 83.0–108.0)

## 2020-11-26 LAB — BLOOD GAS, ARTERIAL
Acid-base deficit: 1.1 mmol/L (ref 0.0–2.0)
Bicarbonate: 24.8 mmol/L (ref 20.0–28.0)
FIO2: 30
O2 Saturation: 98.2 %
Patient temperature: 36.5
pCO2 arterial: 52.9 mmHg — ABNORMAL HIGH (ref 32.0–48.0)
pH, Arterial: 7.288 — ABNORMAL LOW (ref 7.350–7.450)
pO2, Arterial: 113 mmHg — ABNORMAL HIGH (ref 83.0–108.0)

## 2020-11-26 LAB — BRAIN NATRIURETIC PEPTIDE: B Natriuretic Peptide: 1695.6 pg/mL — ABNORMAL HIGH (ref 0.0–100.0)

## 2020-11-26 LAB — CBG MONITORING, ED
Glucose-Capillary: 63 mg/dL — ABNORMAL LOW (ref 70–99)
Glucose-Capillary: 70 mg/dL (ref 70–99)
Glucose-Capillary: 73 mg/dL (ref 70–99)
Glucose-Capillary: 91 mg/dL (ref 70–99)

## 2020-11-26 LAB — D-DIMER, QUANTITATIVE: D-Dimer, Quant: 1.96 ug/mL-FEU — ABNORMAL HIGH (ref 0.00–0.50)

## 2020-11-26 LAB — RESP PANEL BY RT-PCR (FLU A&B, COVID) ARPGX2
Influenza A by PCR: NEGATIVE
Influenza B by PCR: NEGATIVE
SARS Coronavirus 2 by RT PCR: NEGATIVE

## 2020-11-26 MED ORDER — SODIUM CHLORIDE 0.9 % IV SOLN: 250.0000 mL | INTRAVENOUS | Status: DC | PRN

## 2020-11-26 MED ORDER — ACETAMINOPHEN 325 MG PO TABS: 650.0000 mg | ORAL_TABLET | Freq: Four times a day (QID) | ORAL | Status: DC | PRN

## 2020-11-26 MED ORDER — LOSARTAN POTASSIUM 50 MG PO TABS
50.0000 mg | ORAL_TABLET | Freq: Every day | ORAL | Status: DC
  Administered 2020-11-26 – 2020-11-27 (×2): 50 mg via ORAL
  Filled 2020-11-26 (×2): qty 1

## 2020-11-26 MED ORDER — DIPHENHYDRAMINE HCL 25 MG PO TABS
25.0000 mg | ORAL_TABLET | Freq: Every day | ORAL | Status: DC | PRN
  Filled 2020-11-26: qty 1

## 2020-11-26 MED ORDER — SODIUM CHLORIDE 0.9% FLUSH
3.0000 mL | Freq: Two times a day (BID) | INTRAVENOUS | Status: DC
  Administered 2020-11-26 – 2020-12-02 (×11): 3 mL via INTRAVENOUS

## 2020-11-26 MED ORDER — SODIUM CHLORIDE 0.9% FLUSH
3.0000 mL | INTRAVENOUS | Status: DC | PRN
  Administered 2020-11-29 – 2020-12-01 (×2): 3 mL via INTRAVENOUS

## 2020-11-26 MED ORDER — FUROSEMIDE 10 MG/ML IJ SOLN
40.0000 mg | Freq: Two times a day (BID) | INTRAMUSCULAR | Status: AC
Start: 2020-11-26 — End: 2020-11-27
  Administered 2020-11-26 – 2020-11-27 (×3): 40 mg via INTRAVENOUS
  Filled 2020-11-26 (×3): qty 4

## 2020-11-26 MED ORDER — INSULIN ASPART 100 UNIT/ML IJ SOLN: 0.0000 [IU] | Freq: Four times a day (QID) | INTRAMUSCULAR | Status: DC | PRN

## 2020-11-26 MED ORDER — SENNOSIDES-DOCUSATE SODIUM 8.6-50 MG PO TABS: 1.0000 | ORAL_TABLET | Freq: Every evening | ORAL | Status: DC | PRN

## 2020-11-26 MED ORDER — HEPARIN BOLUS VIA INFUSION
4000.0000 [IU] | Freq: Once | INTRAVENOUS | Status: AC
  Administered 2020-11-26: 4000 [IU] via INTRAVENOUS
  Filled 2020-11-26: qty 4000

## 2020-11-26 MED ORDER — ACETAMINOPHEN 650 MG RE SUPP: 650.0000 mg | Freq: Four times a day (QID) | RECTAL | Status: DC | PRN

## 2020-11-26 MED ORDER — HYDRALAZINE HCL 25 MG PO TABS
25.0000 mg | ORAL_TABLET | Freq: Two times a day (BID) | ORAL | Status: DC
  Administered 2020-11-26 – 2020-11-27 (×2): 25 mg via ORAL
  Filled 2020-11-26 (×2): qty 1

## 2020-11-26 MED ORDER — HEPARIN (PORCINE) 25000 UT/250ML-% IV SOLN
600.0000 [IU]/h | INTRAVENOUS | Status: DC
  Administered 2020-11-26: 1100 [IU]/h via INTRAVENOUS
  Administered 2020-11-27 – 2020-11-29 (×2): 1000 [IU]/h via INTRAVENOUS
  Administered 2020-12-01: 600 [IU]/h via INTRAVENOUS
  Filled 2020-11-26 (×5): qty 250

## 2020-11-26 MED ORDER — ATORVASTATIN CALCIUM 40 MG PO TABS
40.0000 mg | ORAL_TABLET | Freq: Every day | ORAL | Status: DC
  Administered 2020-11-27 – 2020-12-03 (×7): 40 mg via ORAL
  Filled 2020-11-26 (×7): qty 1

## 2020-11-26 MED ORDER — ALBUTEROL SULFATE (2.5 MG/3ML) 0.083% IN NEBU: 2.5000 mg | INHALATION_SOLUTION | RESPIRATORY_TRACT | Status: DC | PRN

## 2020-11-26 MED ORDER — EZETIMIBE 10 MG PO TABS
10.0000 mg | ORAL_TABLET | Freq: Every day | ORAL | Status: DC
  Administered 2020-11-27 – 2020-12-03 (×7): 10 mg via ORAL
  Filled 2020-11-26 (×7): qty 1

## 2020-11-26 NOTE — ED Notes (Signed)
ED Provider at bedside. 

## 2020-11-26 NOTE — Progress Notes (Signed)
ANTICOAGULATION CONSULT NOTE - Initial Consult  Pharmacy Consult for heparin Indication:  rule out pulmonary embolus  Allergies  Allergen Reactions   Lisinopril Other (See Comments)   Mercury Nausea And Vomiting    Patient Measurements:   Heparin Dosing Weight: 67kg  Vital Signs: Temp: 98.2 F (36.8 C) (08/12 1203) Temp Source: Oral (08/12 1203) BP: 144/106 (08/12 1745) Pulse Rate: 69 (08/12 1900)  Labs: Recent Labs    11/26/20 1226 11/26/20 1303 11/26/20 1755  HGB 11.7* 12.2 11.9*  HCT 39.1 36.0 35.0*  PLT 228  --   --   CREATININE 2.77*  --   --     Estimated Creatinine Clearance: 18.6 mL/min (A) (by C-G formula based on SCr of 2.77 mg/dL (H)).   Medical History: Past Medical History:  Diagnosis Date   Abdominal aortic atherosclerosis with stenosis 06/29/2015   AKI (acute kidney injury) ([place]) 06/29/2015   Atherosclerosis of coronary artery without angina pectoris 04/21/2020   Atopic dermatitis 04/21/2020   Benign hypertension with CKD (chronic kidney disease) stage III ([place]) 04/21/2020   Bilateral impacted cerumen 08/21/2019   Chest pain 08/31/2020   Cholecystitis 06/29/2015   Cholelithiasis 06/29/2015   Claudication in peripheral vascular disease ([place]) 08/28/2019   Peripheral arterial disease   Colon cancer screening 04/21/2020   Dehydration with hyponatremia 06/29/2015   Diabetes mellitus ([place]) 07/15/2018   Diarrhea 04/21/2020   DKA (diabetic ketoacidoses) 06/29/2015   Dyslipidemia 08/30/2018   Elevated troponin    Epigastric pain 04/21/2020   Gastroesophageal reflux disease 04/21/2020   HTN (hypertension) 06/29/2015   Hyperglycemia due to type 2 diabetes mellitus ([place]) 04/21/2020   Hypertension    Hypertensive emergency 07/12/2020   Hypertensive heart disease without congestive heart failure 04/21/2020   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 180 tablet 2    Cholecalciferol 25 MCG (1000 UT) tablet Take 2,000 Units by mouth daily.      cilostazol (PLETAL) 50 MG tablet Take 1 tablet (50 mg total) by mouth 2 (two) times daily. 180 tablet 3    dicyclomine (BENTYL) 10 MG capsule Take 10 mg by mouth daily.      diphenhydrAMINE (BENADRYL) 25 MG tablet Take 12.5 mg by mouth daily as needed for allergies or sleep.      ezetimibe (ZETIA) 10 MG tablet Take 1 tablet (10 mg total) by mouth daily. 90 tablet 2    famotidine (PEPCID) 20 MG tablet Take 1 tablet by mouth in the morning  and at bedtime.      hydrALAZINE (APRESOLINE) 25 MG tablet Take 1 tablet (25 mg total) by mouth every 12 (twelve) hours. 180 tablet 2    hydrALAZINE (APRESOLINE) 25 MG tablet Take 25 mg by mouth daily.      insulin isophane & regular human (HUMULIN 70/30 MIX) (70-30) 100 UNIT/ML KwikPen Inject 16-20 Units into the skin daily.      ketoconazole (NIZORAL) 2 % cream Apply 1 application topically daily as needed for irritation.       levocetirizine (XYZAL) 5 MG tablet Take 5 mg by mouth at bedtime.       loperamide (IMODIUM  A-D) 2 MG tablet Take 2 mg by mouth 4 (four) times daily as needed for diarrhea or loose stools.      losartan (COZAAR) 50 MG tablet Take 50 mg by mouth daily.      metoprolol succinate (TOPROL-XL) 50 MG 24 hr tablet Take 50 mg by mouth daily.      Pediatric Multivitamins-Iron (FLINTSTONES COMPLETE) 18 MG CHEW Chew 1 tablet by mouth daily. Unknown strength      rosuvastatin (CRESTOR) 40 MG tablet TAKE 1 TABLET(40 MG) BY MOUTH AT BEDTIME (Patient taking differently: Take 40 mg by mouth daily.) 90 tablet 1    triamcinolone cream (KENALOG) 0.1 % Apply 1 application topically daily as needed (spots on legs).        Assessment: 70 year old female admitted from [facility] for shortness of breath and confusion. Unable to do CTA to rule out PE due to renal function, VQ scan ordered, will start heparin now. Hgb 12.2, plt count wnl. Patient not on anticoagulants prior to admit.   Goal of Therapy:  Heparin level 0.3-0.7 units/ml Monitor platelets by anticoagulation protocol: Yes   Plan:  Give 4000 units bolus x 1 Start heparin infusion at 1100 units/hr Check anti-Xa level in 6 hours and daily while on heparin Continue to monitor H&H and platelets  Erin Hearing PharmD., BCPS Clinical Pharmacist 11/26/2020 8:46 PM

## 2020-11-26 NOTE — ED Notes (Signed)
BP would not register in triage after 3 attempts-EMT states she notified Sandria Manly, RN/tx RN

## 2020-11-26 NOTE — ED Notes (Signed)
Pt agreed to eat crackers and Ginger ale. Family at bedside, Pt and family chatting and laughing. Pt A/OX4

## 2020-11-26 NOTE — Progress Notes (Signed)
Patient agreed to try BiPAP. Has tired it before and did not tolerate. Seems to be doing well at this moment. 10/5 and 30%. Family at bedside for moral support. RT to monitor.

## 2020-11-26 NOTE — H&P (Signed)
History and Physical    Molly [name] [id number] DOB: 28-Apr-1950 DOA: 11/26/2020  PCP: [name], MD   Patient coming from: Home  Chief Complaint: SOB  HPI: Molly [name] is a 70 y.o. female with medical history significant for DMT2, HFpEF, HTN, HLD, PVD, CKD 4 who was transferred to [facility] from [facility] ER due to CHF exacerbation and respiratory failure with hypercapnia and hypoxia. [name] went to her scheduled appointment at her doctors office today and was noted to have an O2 saturation of 78% on RA. She reports she has redness of breath has been worse with exertion over the last 2 to 3 weeks.  She states she has not had any edema of her legs or abdomen.  She reports she has not had any long trips or prolonged immobilization.  Reports she has had intermittent chest discomfort but not outright pain she states.  She was admitted in May of this year for similar episode.  At that time echocardiogram revealed an EF of 50 to 55% with LVH and diastolic dysfunction.  Coronary catheterization was not performed secondary to her poor renal function.  She initially went home with supplemental oxygen to use but subsequently was taken off of it after follow-up with pulmonology as she was breathing well and not requiring any oxygen at that time.  She reports that the last few nights she has awoken from sleep with shortness of breath but states she has been trying to lay flat for comfort.  Reportedly she had some confusion when she initially arrived at her outpatient appointment and this is resolved with treatment she received in the emergency room.  She denies tobacco, alcohol, illicit drug use.  ED Course: In the emergency room patient was found to be hypoxic on room air in the low 80s.  She was found to have respiratory acidosis as well as hypercapnia and hypoxia on ABG.  She was started on BiPAP therapy which she is tolerating well.  She is found to have a elevated BNP.  Patient was  transferred to [facility] for admission for acute medical condition.  BNP was 1695.6.  CBC was unremarkable.  Odium was 04/17/1937 potassium 4.3 chloride 101 bicarb 27 BUN 48 creatinine 2.77 alkaline phosphatase 66 AST 59 ALT 80.  ABG shows pH of 7.258 with PCO2 of 64.1 PO2 of 119 bicarb of 31.  D-dimer was elevated at 1.96.  Patient was found to have hypoglycemia with a blood sugar of 56 which improved after she had some oral sugar fluid intake.  COVID-19 swab negative.  Influenza A and B negative  Review of Systems:  General: Reports fatigue. Denies  fever, chills, weight loss, night sweats.  Denies dizziness.  Denies change in appetite HENT: Denies head trauma, headache, denies change in hearing, tinnitus.  Denies nasal congestion. Denies sore throat.  Denies difficulty swallowing Eyes: Denies blurry vision, pain in eye, drainage.  Denies discoloration of eyes. Neck: Denies pain.  Denies swelling.  Denies pain with movement. Cardiovascular: Reports intermittent chest pain. Denies palpitations.  Denies edema.  Denies orthopnea Respiratory: Reports shortness of breath. Denies cough.  Denies wheezing.  Denies sputum production Gastrointestinal: Denies abdominal pain, swelling.  Denies nausea, vomiting, diarrhea.  Denies melena.  Denies hematemesis. Musculoskeletal: Denies limitation of movement.  Denies deformity or swelling.  Denies pain.  Denies arthralgias or myalgias. Genitourinary: Denies pelvic pain.  Denies urinary frequency or hesitancy.  Denies dysuria.  Skin: Denies rash.  Denies petechiae, purpura, ecchymosis.  Neurological: Denies syncope.  Denies seizure activity. Denies  paresthesia. Denies slurred speech, drooping face. Denies visual change. Psychiatric: Denies depression, anxiety.  Denies hallucinations.  Past Medical History:  Diagnosis Date   Abdominal aortic atherosclerosis with stenosis 06/29/2015   AKI (acute kidney injury) ([place]) 06/29/2015   Atherosclerosis of coronary artery  without angina pectoris 04/21/2020   Atopic dermatitis 04/21/2020   Benign hypertension with CKD (chronic kidney disease) stage III ([place]) 04/21/2020   Bilateral impacted cerumen 08/21/2019   Chest pain 08/31/2020   Cholecystitis 06/29/2015   Cholelithiasis 06/29/2015   Claudication in peripheral vascular disease ([place]) 08/28/2019   Peripheral arterial disease   Colon cancer screening 04/21/2020   Dehydration with hyponatremia 06/29/2015   Diabetes mellitus ([place]) 07/15/2018   Diarrhea 04/21/2020   DKA (diabetic ketoacidoses) 06/29/2015   Dyslipidemia 08/30/2018   Elevated troponin    Epigastric pain 04/21/2020   Gastroesophageal reflux disease 04/21/2020   HTN (hypertension) 06/29/2015   Hyperglycemia due to type 2 diabetes mellitus ([place]) 04/21/2020   Hypertension    Hypertensive emergency 07/12/2020   Hypertensive heart disease without congestive heart failure 04/21/2020   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   Proptosis 04/21/2020   Proximal leg weakness 09/10/2018   Ptosis of left eyelid 09/10/2018   Renal artery stenosis ([place]) 12/17/2019   Renal artery stenosis   Standard chest x-ray abnormal 04/21/2020   Vitamin D deficiency 04/21/2020   Weight loss 04/21/2020    Past Surgical History:  Procedure Laterality Date   ABDOMINAL AORTOGRAM W/LOWER EXTREMITY N/A 08/28/2019   Procedure: ABDOMINAL AORTOGRAM W/LOWER EXTREMITY;  Surgeon: Lorretta Harp, MD;  Location: [facility];  Service: Cardiovascular;  Laterality: N/A;   CATARACT EXTRACTION, BILATERAL     CHOLECYSTECTOMY N/A 06/30/2015   Procedure: LAPAROSCOPIC CHOLECYSTECTOMY WITH INTRAOPERATIVE CHOLANGIOGRAM;  Surgeon: Donnie Mesa, MD;  Location: [facility];  Service: General;  Laterality: N/A;   PERIPHERAL VASCULAR BALLOON ANGIOPLASTY Right 08/28/2019   Procedure: PERIPHERAL VASCULAR BALLOON ANGIOPLASTY;  Surgeon: Lorretta Harp, MD;  Location: [place] [facility];  Service: Cardiovascular;  Laterality: Right;  attempted SFA    Social History  reports that she has never smoked. She has never used smokeless tobacco. She reports that she does not drink alcohol and does not use drugs.  Allergies  Allergen Reactions   Lisinopril Other (See Comments)   Mercury Nausea And Vomiting    Family History  Problem Relation Age of Onset   Breast cancer Mother    Hypertension Mother    Hypertension Father    Pancreatic cancer Other        uncle     Prior to Admission medications   Medication Sig Start Date End Date Taking? Authorizing Provider  carvedilol (COREG) 25 MG tablet Take 1 tablet (25 mg total) by mouth 2 (two) times daily with a meal. 10/28/20 07/25/21  Park Liter, MD  Cholecalciferol 25 MCG (1000 UT) tablet Take 2,000 Units by mouth  daily.    [provider]  cilostazol (PLETAL) 50 MG tablet Take 1 tablet (50 mg total) by mouth 2 (two) times daily. 03/15/20   Lorretta Harp, MD  dicyclomine (BENTYL) 10 MG capsule Take 10 mg by mouth daily.    [provider]  diphenhydrAMINE (BENADRYL) 25 MG tablet Take 12.5 mg by mouth daily as needed for allergies or sleep.    [provider]  ezetimibe (ZETIA) 10 MG tablet Take 1 tablet (10 mg total) by mouth daily. 10/28/20 07/25/21  Park Liter, MD  famotidine (PEPCID) 20 MG tablet Take 1 tablet by mouth in the morning and at bedtime.    [provider]  hydrALAZINE (APRESOLINE) 25 MG tablet Take 1 tablet (25  mg total) by mouth every 12 (twelve) hours. 10/28/20 07/25/21  Park Liter, MD  hydrALAZINE (APRESOLINE) 25 MG tablet Take 25 mg by mouth daily. 09/14/20   [provider]  insulin isophane & regular human (HUMULIN 70/30 MIX) (70-30) 100 UNIT/ML KwikPen Inject 16-20 Units into the skin daily. 06/26/18   [provider]  ketoconazole (NIZORAL) 2 % cream Apply 1 application topically daily as needed for irritation.  05/08/19   [provider]  levocetirizine (XYZAL) 5 MG tablet Take 5 mg by mouth at bedtime.  05/12/19   [provider]  loperamide (IMODIUM A-D) 2 MG tablet Take 2 mg by mouth 4 (four) times daily as needed for diarrhea or loose stools.    [provider]  losartan (COZAAR) 50 MG tablet Take 50 mg by mouth daily.    [provider]  metoprolol succinate (TOPROL-XL) 50 MG 24 hr tablet Take 50 mg by mouth daily.    [provider]  Pediatric Multivitamins-Iron (FLINTSTONES COMPLETE) 18 MG CHEW Chew 1 tablet by mouth daily. Unknown strength    [provider]  rosuvastatin (CRESTOR) 40 MG tablet TAKE 1 TABLET(40 MG) BY MOUTH AT BEDTIME Patient taking differently: Take 40 mg by mouth daily. 05/10/20   Park Liter, MD  triamcinolone cream (KENALOG) 0.1 % Apply 1 application topically daily as needed (spots on legs).     [provider]    Physical Exam: Vitals:   11/26/20 1725 11/26/20 1745 11/26/20 1800 11/26/20 1900  BP: (!) 141/89 (!) 144/106    Pulse: (!) 54 (!) 52 (!) 54 69  Resp: '16 18 18 '$ (!) 21  Temp:      TempSrc:      SpO2: 100% 100%  100%    Constitutional: NAD, calm, comfortable Vitals:   11/26/20 1725 11/26/20 1745 11/26/20 1800 11/26/20 1900  BP: (!) 141/89 (!) 144/106    Pulse: (!) 54 (!) 52 (!) 54 69  Resp: '16 18 18 '$ (!) 21  Temp:      TempSrc:      SpO2: 100% 100%  100%   General: WDWN, Alert and oriented x3.  Eyes: EOMI, PERRL, conjunctivae normal.  Sclera  nonicteric HENT:  [place]/AT, external ears normal.  Nares patent without epistasis. Bipap mask in good position. No lesion of external mouth visualized.  Neck: Soft, normal range of motion, supple, no masses, no thyromegaly. Trachea midline Respiratory: Diminished breath sounds throughout breath sounds. Diffuse rales. no wheezing, no crackles. Normal respiratory effort. No accessory muscle use.  Cardiovascular: Regular rhythm with bradycardia, no murmurs / rubs / gallops. No extremity edema. 1+ pedal pulses. Abdomen: Soft, no tenderness, nondistended, no rebound or guarding.  No masses palpated. Bowel sounds normoactive  Musculoskeletal: FROM. no cyanosis. No joint deformity upper and lower extremities. Normal muscle tone.  Skin: Warm, dry, intact no rashes, lesions, ulcers. No induration Neurologic: CN 2-12 grossly intact.  Normal speech.  Sensation intact to touch. Strength 5/5 in all extremities.   Psychiatric: Normal judgment and insight.  Normal mood.    Labs on Admission: I have personally reviewed following labs and imaging studies  CBC: Recent Labs  Lab 11/26/20 1226 11/26/20 1303 11/26/20 1755  WBC 5.2  --   --   NEUTROABS 3.2  --   --   HGB 11.7* 12.2 11.9*  HCT 39.1 36.0 35.0*  MCV 92.2  --   --   PLT 228  --   --     Basic Metabolic Panel: Recent Labs  Lab 11/26/20 1226 11/26/20 1303 11/26/20 1755  NA 139 141 141  K 4.3 4.2 4.3  CL 101  --   --   CO2 27  --   --   GLUCOSE 56*  --   --   BUN 48*  --   --   CREATININE 2.77*  --   --   CALCIUM 8.7*  --   --     GFR: Estimated Creatinine Clearance: 18.6 mL/min (A) (by C-G formula based on SCr of 2.77 mg/dL (H)).  Liver Function Tests: Recent Labs  Lab 11/26/20 1226  AST 59*  ALT 80*  ALKPHOS 66  BILITOT 0.5  PROT 7.6  ALBUMIN 4.1    Urine analysis:    Component Value Date/Time   COLORURINE YELLOW 06/29/2015 1636   APPEARANCEUR HAZY (A) 06/29/2015 1636   LABSPEC 1.030 06/29/2015 1636   PHURINE 5.5  06/29/2015 1636   GLUCOSEU >1000 (A) 06/29/2015 1636   HGBUR NEGATIVE 06/29/2015 1636   BILIRUBINUR NEGATIVE 06/29/2015 1636   KETONESUR NEGATIVE 06/29/2015 1636   PROTEINUR NEGATIVE 06/29/2015 1636   NITRITE NEGATIVE 06/29/2015 1636   LEUKOCYTESUR NEGATIVE 06/29/2015 1636    Radiological Exams on Admission: DG Chest Port 1 View  Result Date: 11/26/2020 CLINICAL DATA:  SOB (shortness of breath) R06.02 (ICD-10-CM) EXAM: PORTABLE CHEST 1 VIEW COMPARISON:  October 08, 2020. FINDINGS: Low lung volumes with increased left greater than right basilar opacities. No visible pleural effusions or pneumothorax. Cardiomediastinal silhouette is mildly enlarged, likely accentuated by low lung volumes and AP technique. No acute osseous abnormality. IMPRESSION: Low lung volumes with increased left greater than right basilar opacities, suspicious for pneumonia. Electronically Signed   By: Margaretha Sheffield M.D.   On: 11/26/2020 13:42    EKG: Independently reviewed.  EKG shows sinus bradycardia with a heart rate of 52.  Patient is now with a heart rate of 56-60 on monitor.  No acute ST elevation or depression.  QTc prolonged at 486  Assessment/Plan Principal Problem:   Acute on chronic heart failure with preserved ejection fraction (HFpEF)  Ms. Foxall is admitted to Progressive care unit for close monitoring Diuresis with lasix bid. Monitor I&Os and daily weight.  Pt had echo in May 2022 which showed EF of 50-55% with LVH and diastolic dysfunction. Obtain echo in am to evaluate if acute change in cardiac function. Check serial troponin levels Beta blocker held with bradycardia at this time Consider starting SLGT2 inhibitor with DM and CHF as studies indicate this class of medication can reduce risk of CHF exacerbation and hospitalization.   Active Problems:   Acute on chronic respiratory failure with hypoxia and hypercapnia  Is on Bipap. RT to follow. Monitor ABG.  Secondary to CHF exacerbation. PE cannot be  excluded at this point as could not get CTA chest with renal function.  Ordered VQ scan Will anticoagulate with heparin infusion to treat for potential PE until it can be ruled out.     Respiratory Acidosis On Bipap as above.     Type 2 diabetes mellitus with stage 4 chronic kidney disease, with long-term current use of insulin  Check HgbA1c level Corrective insulin ordered. Will check blood sugar every 6 hour while NPO on Bipap SLGT2 inhibitor therapy will need to be discussed with pt with risk and benefits reviewed to decide if pt would benefit and agree to therapy.     Stage 4 chronic kidney disease  Chronic. Monitor renal function and electrolytes on labs in am    HTN (hypertension) Continue Cozaar and hydralazine. Beta Blocker held with bradycardia. Monitor BP    Elevated d-dimer VQ scan ordered. Initiate heparin until PE can be ruled out.    Prolonged QT interval Avoid Medications which could further prolong QT interval    DVT prophylaxis: Is placed on heparin infusion for potential PE with elevated D-dimer and acute respiratory failure.  Patient cannot have CT angiography of chest secondary to renal function.  VQ scan is ordered and pending Code Status:   Full Code  Family Communication:  Diagnosis and plan discussed with patient.  She verbalized understanding agrees with plan.  Further recommendations to follow as clinically indicated Disposition Plan:   Patient is from:  Home  Anticipated DC to:  Home  Anticipated DC date:  Anticipate 2 midnight or more stay in the hospital to treat acute condition  Anticipated DC barriers: No barriers to discharge identified at this time  Admission status:  Inpatient   Yevonne Aline Kerilyn Cortner MD [facility]  How to contact the [facility] Attending or Consulting provider [place] or covering provider during after hours [place], for this patient?   Check the care team in [facility] and look for a) attending/consulting [facility] provider listed and b)  the [facility] team listed Log into [email or web address] and use [place] [facility]'s universal password to access. If you do not have the password, please contact the hospital operator. Locate the [facility] provider you are looking for under [facility] and page to a number that you can be directly reached. If you still have difficulty reaching the provider, please page the [facility] (Director on Call) for the Hospitalists listed on amion for assistance.  11/26/2020, 8:04 PM

## 2020-11-26 NOTE — Progress Notes (Signed)
ABG redrawn, and CO2 was still elevated. Increased to 12/6

## 2020-11-26 NOTE — Progress Notes (Signed)
Report called to Onalee Hua, RRT at [facility]. Patient still on BiPAP and tolerating well.

## 2020-11-26 NOTE — ED Triage Notes (Signed)
Pt reports SOB, disoriented, "not sleeping well" x 1 week - labored breathing noted-pt to triage in w/c-states she was brought from PCP office in the building

## 2020-11-26 NOTE — ED Provider Notes (Signed)
[facility] Provider Note   CSN: [id number] Arrival date & time: 11/26/20  1152     History Chief Complaint  Patient presents with  . Shortness of Breath    Molly [name] is a 70 y.o. female.  Molly [name] presents directly from cardiology clinic.  She was noted to have an oxygen saturation of 72% today.  She was hospitalized for over 1 week in May of this year.  She was admitted for hypoxia, and extensive work-up did not reveal the exact source of her symptoms.  Echocardiogram revealed a low/normal EF of between 50 and 55%.  She did not have a coronary catheterization secondary to acute kidney injury.  She followed up with pulmonary a few weeks ago, and her home oxygen was discontinued because she did not desaturate with ambulation.  She reports that she has been struggling with shortness of breath for the past several weeks.  She has had difficulty sleeping and has awoken short of breath.  O2 sats at home have been 80%.  She has had some confusion as well.  The history is provided by the patient.  Shortness of Breath Severity:  Severe Onset quality:  Gradual Duration: 3 months; worse x about 2 weeks. Timing:  Constant Progression:  Worsening Chronicity:  Recurrent Context comment:  Possibly CHF though exact cause is unclear Relieved by:  Oxygen Worsened by:  Exertion Associated symptoms: chest pain   Associated symptoms: no abdominal pain, no cough, no diaphoresis, no ear pain, no fever, no rash, no sore throat and no vomiting       Past Medical History:  Diagnosis Date  . Abdominal aortic atherosclerosis with stenosis 06/29/2015  . AKI (acute kidney injury) ([place]) 06/29/2015  . Atherosclerosis of coronary artery without angina pectoris 04/21/2020  . Atopic dermatitis 04/21/2020  . Benign hypertension with CKD (chronic kidney disease) stage III ([place]) 04/21/2020  . Bilateral impacted cerumen 08/21/2019  . Chest pain 08/31/2020  . Cholecystitis 06/29/2015   . Cholelithiasis 06/29/2015  . Claudication in peripheral vascular disease ([place]) 08/28/2019   Peripheral arterial disease  . Colon cancer screening 04/21/2020  . Dehydration with hyponatremia 06/29/2015  . Diabetes mellitus ([place]) 07/15/2018  . Diarrhea 04/21/2020  . DKA (diabetic ketoacidoses) 06/29/2015  . Dyslipidemia 08/30/2018  . Elevated troponin   . Epigastric pain 04/21/2020  . Gastroesophageal reflux disease 04/21/2020  . HTN (hypertension) 06/29/2015  . Hyperglycemia due to type 2 diabetes mellitus ([place]) 04/21/2020  . Hypertension   . Hypertensive emergency 07/12/2020  . 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 Left-sided chest pain 04/21/2020  . Leukocytosis 06/29/2015  . Long term (current) use of insulin ([place]) 04/21/2020  . Loss of appetite 04/21/2020  . Nausea 04/21/2020  . Occlusion and stenosis of bilateral carotid arteries 04/21/2020  . Osteopenia 10/02/2018  . Other dysphagia 09/10/2018  . Peripheral vascular disease ([place]) 07/15/2018   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  Noncritical  . 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 Proptosis 04/21/2020  . Proximal leg weakness 09/10/2018  . Ptosis of left eyelid 09/10/2018  . Renal artery stenosis ([place]) 12/17/2019   Renal artery stenosis  . Standard chest x-ray abnormal 04/21/2020  . Vitamin D deficiency 04/21/2020  . Weight loss 04/21/2020    Patient Active Problem List   Diagnosis Date  Noted  . Dyspnea on exertion 11/26/2020  . Congestive heart failure (CHF) ([place]) 10/07/2020  . Elevated troponin   . Chest pain 08/31/2020  . Hypertensive emergency 07/12/2020  . Type 2 diabetes mellitus with stage 3b chronic kidney disease, with long-term current use of insulin ([place]) 07/12/2020  . Hypertension   . Diabetes mellitus without complication  ([place])   . Atherosclerosis of coronary artery without angina pectoris 04/21/2020  . Atopic dermatitis 04/21/2020  . Colon cancer screening 04/21/2020  . Diarrhea 04/21/2020  . Gastroesophageal reflux disease 04/21/2020  . Hyperglycemia due to type 2 diabetes mellitus ([place]) 04/21/2020  . 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 Left-sided chest pain 04/21/2020  . Long term (current) use of insulin ([place]) 04/21/2020  . Loss of appetite 04/21/2020  . Nausea 04/21/2020  . Occlusion and stenosis of bilateral carotid arteries 04/21/2020  . Proptosis 04/21/2020  . Benign hypertension with CKD (chronic kidney disease) stage III ([place]) 04/21/2020  . Epigastric pain 04/21/2020  . Standard chest x-ray abnormal 04/21/2020  . Vitamin D deficiency 04/21/2020  . Weight loss 04/21/2020  . Renal artery stenosis ([place]) 12/17/2019  . Claudication in peripheral vascular disease ([place]) 08/28/2019  . Bilateral impacted cerumen 08/21/2019  . 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 Osteopenia 10/02/2018  . Other dysphagia 09/10/2018  . 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 Proximal leg weakness 09/10/2018  . Ptosis of left eyelid 09/10/2018  . Dyslipidemia 08/30/2018  . Type 2 diabetes mellitus with stage 4 chronic kidney disease, with long-term current use of insulin ([place]) 07/15/2018  . Peripheral vascular disease ([place]) 07/15/2018  . DKA (diabetic ketoacidoses) 06/29/2015  . HTN (hypertension) 06/29/2015  . Acute kidney injury ([place]) 06/29/2015  . Cholelithiasis 06/29/2015  . Abdominal aortic atherosclerosis with stenosis 06/29/2015  . Dehydration with hyponatremia 06/29/2015  . Leukocytosis 06/29/2015  . Cholecystitis 06/29/2015    Past Surgical History:  Procedure Laterality Date  . ABDOMINAL AORTOGRAM W/LOWER EXTREMITY N/A 08/28/2019    Procedure: ABDOMINAL AORTOGRAM W/LOWER EXTREMITY;  Surgeon: Lorretta Harp, MD;  Location: [facility];  Service: Cardiovascular;  Laterality: N/A;  . CATARACT EXTRACTION, BILATERAL    . CHOLECYSTECTOMY N/A 06/30/2015   Procedure: LAPAROSCOPIC CHOLECYSTECTOMY WITH INTRAOPERATIVE CHOLANGIOGRAM;  Surgeon: Donnie Mesa, MD;  Location: [facility];  Service: General;  Laterality: N/A;  . PERIPHERAL VASCULAR BALLOON ANGIOPLASTY Right 08/28/2019   Procedure: PERIPHERAL VASCULAR BALLOON ANGIOPLASTY;  Surgeon: Lorretta Harp, MD;  Location: [facility];  Service: Cardiovascular;  Laterality: Right;  attempted SFA     OB History   No obstetric history on file.     Family History  Problem Relation Age of Onset  . Breast cancer Mother   . Hypertension Mother   . Hypertension Father   . Pancreatic cancer Other        uncle    Social History   Tobacco Use  . Smoking status: Never  . Smokeless tobacco: Never  Vaping Use  . Vaping Use: Never used  Substance Use Topics  . Alcohol use: No  . Drug use: Never    Home Medications Prior to Admission medications   Medication Sig Start Date End Date Taking? Authorizing Provider  carvedilol (COREG) 25 MG tablet Take 1 tablet (25 mg total) by mouth 2 (two) times daily with a meal. 10/28/20 07/25/21  Park Liter, MD  Cholecalciferol 25 MCG (1000 UT) tablet Take 2,000 Units by mouth daily.    [provider]  cilostazol (PLETAL) 50 MG tablet Take 1 tablet (50 mg total) by mouth 2 (two) times daily. 03/15/20   Lorretta Harp, MD  dicyclomine (BENTYL) 10 MG capsule Take 10 mg by mouth daily.    [provider]  diphenhydrAMINE (BENADRYL) 25 MG tablet Take 12.5 mg by mouth daily as needed for allergies or sleep.    [provider]  ezetimibe (ZETIA) 10 MG tablet Take 1 tablet (10 mg total) by mouth daily. 10/28/20 07/25/21  Park Liter, MD  famotidine (PEPCID) 20 MG tablet Take 1 tablet by mouth in  the morning and at bedtime.    [provider]  hydrALAZINE (APRESOLINE) 25 MG tablet Take 1 tablet (25 mg total) by mouth every 12 (twelve) hours. 10/28/20 07/25/21  Park Liter, MD  hydrALAZINE (APRESOLINE) 25 MG tablet Take 25 mg by mouth daily. 09/14/20   [provider]  insulin isophane & regular human (HUMULIN 70/30 MIX) (70-30) 100 UNIT/ML KwikPen Inject 16-20 Units into the skin daily. 06/26/18   [provider]  ketoconazole (NIZORAL) 2 % cream Apply 1 application topically daily as needed for irritation.  05/08/19   [provider]  levocetirizine (XYZAL) 5 MG tablet Take 5 mg by mouth at bedtime.  05/12/19   [provider]  loperamide (IMODIUM A-D) 2 MG tablet Take 2 mg by mouth 4 (four) times daily as needed for diarrhea or loose stools.    [provider]  losartan (COZAAR) 50 MG tablet Take 50 mg by mouth daily.    [provider]  metoprolol succinate (TOPROL-XL) 50 MG 24 hr tablet Take 50 mg by mouth daily.    [provider]  Pediatric Multivitamins-Iron (FLINTSTONES COMPLETE) 18 MG CHEW Chew 1 tablet by mouth daily. Unknown strength    [provider]  rosuvastatin (CRESTOR) 40 MG tablet TAKE 1 TABLET(40 MG) BY MOUTH AT BEDTIME Patient taking differently: Take 40 mg by mouth daily. 05/10/20   Park Liter, MD  triamcinolone cream (KENALOG) 0.1 % Apply 1 application topically daily as needed (spots on legs).     [provider]    Allergies    Lisinopril and Mercury  Review of Systems   Review of Systems  Constitutional:  Negative for chills, diaphoresis and fever.  HENT:  Negative for ear pain and sore throat.   Eyes:  Negative for pain and visual disturbance.  Respiratory:  Positive for shortness of breath. Negative for cough.   Cardiovascular:  Positive for chest pain. Negative for palpitations.  Gastrointestinal:  Negative for abdominal pain and vomiting.  Genitourinary:   Negative for dysuria and hematuria.  Musculoskeletal:  Negative for arthralgias and back pain.  Skin:  Negative for color change and rash.  Neurological:  Negative for seizures and syncope.  Psychiatric/Behavioral:  Positive for confusion.   All other systems reviewed and are negative.  Physical Exam Updated Vital Signs BP (!) 178/82   Pulse (!) 51   Temp 98.2 F (36.8 C) (Oral)   Resp 17   SpO2 98%   Physical Exam Constitutional:      Appearance: She is ill-appearing and diaphoretic.  HENT:     Head: Normocephalic and atraumatic.     Mouth/Throat:     Mouth: Mucous membranes are moist.  Eyes:     Extraocular Movements: Extraocular movements intact.  Cardiovascular:  Rate and Rhythm: Regular rhythm. Bradycardia present.     Heart sounds: Normal heart sounds.  Pulmonary:     Effort: Tachypnea and accessory muscle usage present.     Breath sounds: Examination of the right-upper field reveals decreased breath sounds. Examination of the left-upper field reveals decreased breath sounds. Examination of the right-middle field reveals decreased breath sounds. Examination of the left-middle field reveals decreased breath sounds. Examination of the right-lower field reveals decreased breath sounds. Examination of the left-lower field reveals decreased breath sounds. Decreased breath sounds present.  Abdominal:     Palpations: Abdomen is soft. There is no mass.     Tenderness: There is no abdominal tenderness.  Musculoskeletal:     Right lower leg: No edema.     Left lower leg: No edema.  Skin:    Coloration: Skin is pale.  Neurological:     General: No focal deficit present.     Mental Status: She is oriented to person, place, and time.  Psychiatric:        Mood and Affect: Mood normal.        Behavior: Behavior normal.    ED Results / Procedures / Treatments   Labs (all labs ordered are listed, but only abnormal results are displayed) Labs Reviewed  CBC WITH  DIFFERENTIAL/PLATELET - Abnormal; Notable for the following components:      Result Value   Hemoglobin 11.7 (*)    MCHC 29.9 (*)    nRBC 0.4 (*)    All other components within normal limits  BRAIN NATRIURETIC PEPTIDE - Abnormal; Notable for the following components:   B Natriuretic Peptide 1,695.6 (*)    All other components within normal limits  D-DIMER, QUANTITATIVE - Abnormal; Notable for the following components:   D-Dimer, Quant 1.96 (*)    All other components within normal limits  COMPREHENSIVE METABOLIC PANEL - Abnormal; Notable for the following components:   Glucose, Bld 56 (*)    BUN 48 (*)    Creatinine, Ser 2.77 (*)    Calcium 8.7 (*)    AST 59 (*)    ALT 80 (*)    GFR, Estimated 18 (*)    All other components within normal limits  I-STAT ARTERIAL BLOOD GAS, ED - Abnormal; Notable for the following components:   pH, Arterial 7.258 (*)    pCO2 arterial 64.1 (*)    pO2, Arterial 119 (*)    Bicarbonate 28.6 (*)    All other components within normal limits  RESP PANEL BY RT-PCR (FLU A&B, COVID) ARPGX2  BLOOD GAS, ARTERIAL  CBG MONITORING, ED  CBG MONITORING, ED    EKG EKG Interpretation  Date/Time:  Friday November 26 2020 12:42:13 EDT Ventricular Rate:  52 PR Interval:  199 QRS Duration: 86 QT Interval:  522 QTC Calculation: 486 R Axis:   100 Text Interpretation: Sinus bradycardia Probable left atrial enlargement Low voltage, extremity and precordial leads: low voltage notable when compared to prior EKG No acute ischemia Borderline prolonged QT interval Confirmed by Lorre Munroe ([id number]) on 11/26/2020 12:53:25 PM  Radiology No results found.  Procedures .Critical Care  Date/Time: 11/26/2020 3:27 PM Performed by: Arnaldo Natal, MD Authorized by: Arnaldo Natal, MD   Critical care provider statement:    Critical care time (minutes):  60   Critical care time was exclusive of:  Separately billable procedures and treating other patients and teaching time    Critical care was necessary to treat or prevent imminent or life-threatening  deterioration of the following conditions:  Cardiac failure, respiratory failure and renal failure   Critical care was time spent personally by me on the following activities:  Blood draw for specimens, development of treatment plan with patient or surrogate, discussions with consultants, evaluation of patient's response to treatment, examination of patient, obtaining history from patient or surrogate, ordering and performing treatments and interventions, ordering and review of laboratory studies, ordering and review of radiographic studies, pulse oximetry, re-evaluation of patient's condition and review of old charts   I assumed direction of critical care for this patient from another provider in my specialty: no     Care discussed with: admitting provider     Medications Ordered in ED Medications - No data to display  ED Course  I have reviewed the triage vital signs and the nursing notes.  Pertinent labs & imaging results that were available during my care of the patient were reviewed by me and considered in my medical decision making (see chart for details).  Clinical Course as of 11/26/20 1531  Fri Nov 26, 2020  1528 I spoke with Dr. Tamala Julian regarding admission.  [AW]    Clinical Course User Index [AW] Arnaldo Natal, MD   MDM Rules/Calculators/A&P                           [name] presented to the emergency department from cardiology clinic with a low O2 sat.  She had previously been evaluated for possible CHF during a hospitalization 3 months ago.  She was discharged with oxygen but this therapy was discontinued after she improved clinically.  Symptoms have recurred, and she is presenting in a very similar fashion to her presentation in May.  She was noted to be hypoxic and was placed on 2 L of oxygen.  Blood gas revealed hypercarbia and a respiratory acidosis.  She was then placed on BiPAP.  Although  x-ray was free from pulmonary edema, cardiomegaly, her B natruretic peptide elevation seems to suggest congestive heart failure as a possible etiology of her symptoms.  Her renal function does not allow CT scan to evaluate for PE.  VQ scan will be considered, but it is not currently available at [facility].  Given the likelihood of her presentation representing CHF rather than PE and the fact that heparin drips cannot be titrated accurately here due to the blood needing to be couriered back and forth in order to obtain PT values, anticoagulation will be held until she is transferred.  Other issues that have been present in the ED include some mild hypoglycemia which was treated with oral sugar containing fluid.  Antibiotics were held due to lack of presentation as pneumonia. Final Clinical Impression(s) / ED Diagnoses Final diagnoses:  Acute respiratory failure with hypoxia and hypercarbia (HCC)  Acute congestive heart failure, unspecified heart failure type (HCC)  Stage 4 chronic kidney disease ([place])  Diabetes mellitus without complication [facility])    Rx / DC Orders ED Discharge Orders     None        Arnaldo Natal, MD 11/26/20 1531

## 2020-11-26 NOTE — ED Notes (Addendum)
Given coke and peanut butter crackers but refused

## 2020-11-26 NOTE — ED Notes (Signed)
Report given to Roderic Palau RN with [facility], ETA about 30 minutes

## 2020-11-26 NOTE — ED Notes (Addendum)
Report called to receiving RN [id number]. All questions and concerns addressed at this time.

## 2020-11-26 NOTE — Plan of Care (Signed)
Transferred from [facility] Ms.  Molly [name] is a 70 year old female with DM2, HTN, CKD3b, PAD, and CHF who presented with c/o of shortness of breath and intermittent episodes of confusion found to be hypoxic into the 70s at cardiology office visit today.  O2 saturations were around 56. ABG significant for pH 7.258 with PCO2 64.1.  Patient to be placed on BiPAP.  Labs significant for BNP 1695.6, D-dimer 1.96, and glucose 52.  Accepted patient to a progressive bed. Given oral glucose.  CBG checks put in place.

## 2020-11-26 NOTE — Progress Notes (Signed)
Cardiology Office Note:    Date:  11/26/2020   ID:  MORENIKE [name], DOB Jul 25, 1950, MRN [id number]  PCP:  Molly Cha, MD  Cardiologist:  Molly Campus, MD    Referring MD: Molly [name],*   Chief Complaint  Patient presents with   Shortness of Breath  I am short of breath and sometimes am confused  History of Present Illness:    Molly [name] is a 70 y.o. female   with extremely complex past medical history.  He does have significant peripheral vascular disease with claudications, essential hypertension, diabetes, dyslipidemia, kidney dysfunction.  Recently she ended up in the hospital.  She was there because of decompensated congestive heart failure, her troponin was minimally elevated.  Feeling was that all those problems were related to hypertensive crisis.  All were managed appropriately and she improved quite significantly from symptomatology point review, however, kidney function significantly deteriorated with latest creatinine more than 4.  She has been followed by nephrologist nephrologist discontinued her diuretic as well as ARB.  She does have follow-up appoint with him in 3 weeks.  When she was in the hospital there was some idea about doing cardiac catheterization on her to check what the burden of coronary artery she does have that explain her cardiomyopathy/congestive heart failure, however this idea has been abandoned secondary to the fact that she developed significant kidney failure.  There was some talks about potentially doing stress test however she did not want to have any intervention done because of risk of complete kidney shutdown.   She comes today to my office for regular follow-up.  She said she did see pulmonary doctor few weeks ago who did some testing and told her she does not need oxygen so she stopped it.  However within the last week or so she has noted progressive worsening of shortness of breath.  She also noted her oxygen saturation going  down to low 80s.  Her husband who is with her in the room described the fact that she got episodes of confusion.  She described to have some chest pain and she pinpoint in the left side of her chest with her finger she is at that spot the pain is sometimes.  There is no swelling of lower extremities.  She did have very high blood pressure in our office today 210/90.  Denies having chest pain on my interview.  Past Medical History:  Diagnosis Date   Abdominal aortic atherosclerosis with stenosis 06/29/2015   AKI (acute kidney injury) ([place]) 06/29/2015   Atherosclerosis of coronary artery without angina pectoris 04/21/2020   Atopic dermatitis 04/21/2020   Benign hypertension with CKD (chronic kidney disease) stage III ([place]) 04/21/2020   Bilateral impacted cerumen 08/21/2019   Chest pain 08/31/2020   Cholecystitis 06/29/2015   Cholelithiasis 06/29/2015   Claudication in peripheral vascular disease ([place]) 08/28/2019   Peripheral arterial disease   Colon cancer screening 04/21/2020   Dehydration with hyponatremia 06/29/2015   Diabetes mellitus ([place]) 07/15/2018   Diarrhea 04/21/2020   DKA (diabetic ketoacidoses) 06/29/2015   Dyslipidemia 08/30/2018   Elevated troponin    Epigastric pain 04/21/2020   Gastroesophageal reflux disease 04/21/2020   HTN (hypertension) 06/29/2015   Hyperglycemia due to type 2 diabetes mellitus ([place]) 04/21/2020   Hypertension    Hypertensive emergency 07/12/2020   Hypertensive heart disease without congestive heart failure 04/21/2020   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   Proptosis 04/21/2020   Proximal leg weakness 09/10/2018   Ptosis of left eyelid 09/10/2018   Renal artery stenosis ([place]) 12/17/2019   Renal artery stenosis   Standard chest x-ray abnormal 04/21/2020   Vitamin D deficiency 04/21/2020   Weight loss 04/21/2020    Past Surgical History:  Procedure Laterality Date   ABDOMINAL AORTOGRAM W/LOWER EXTREMITY N/A 08/28/2019   Procedure: ABDOMINAL AORTOGRAM W/LOWER EXTREMITY;  Surgeon: Lorretta Harp, MD;  Location: [facility];  Service: Cardiovascular;  Laterality: N/A;   CATARACT EXTRACTION, BILATERAL     CHOLECYSTECTOMY N/A 06/30/2015   Procedure: LAPAROSCOPIC CHOLECYSTECTOMY WITH INTRAOPERATIVE CHOLANGIOGRAM;  Surgeon: Donnie Mesa, MD;  Location: [facility];  Service: General;  Laterality: N/A;   PERIPHERAL VASCULAR BALLOON ANGIOPLASTY Right 08/28/2019   Procedure: PERIPHERAL VASCULAR BALLOON ANGIOPLASTY;  Surgeon: Lorretta Harp, MD;  Location: [facility];  Service: Cardiovascular;  Laterality: Right;  attempted SFA    Current Medications: Current Meds  Medication Sig   carvedilol (COREG) 25 MG tablet Take 1 tablet (25 mg total) by mouth 2 (two) times daily with a meal.   Cholecalciferol 25 MCG (1000 UT) tablet Take 2,000 Units by mouth daily.   cilostazol (PLETAL) 50 MG tablet Take 1 tablet (50 mg total) by mouth 2 (two) times daily.   dicyclomine (BENTYL) 10 MG capsule Take 10 mg by mouth daily.   diphenhydrAMINE (BENADRYL) 25 MG tablet Take 12.5 mg by mouth daily as needed for allergies or sleep.   ezetimibe (ZETIA) 10 MG tablet Take 1 tablet (10 mg total) by mouth daily.   famotidine (PEPCID) 20 MG tablet Take 1 tablet by mouth in the morning and at bedtime.   hydrALAZINE (APRESOLINE) 25 MG tablet Take 1 tablet (25 mg total) by mouth every 12 (twelve) hours.   hydrALAZINE (APRESOLINE) 25 MG tablet Take 25 mg by  mouth daily.   insulin isophane & regular human (HUMULIN 70/30 MIX) (70-30) 100 UNIT/ML KwikPen Inject 16-20 Units into the skin daily.   ketoconazole (NIZORAL) 2 % cream Apply 1 application topically daily as needed for irritation.    levocetirizine (XYZAL) 5 MG tablet Take 5 mg by mouth at bedtime.    loperamide (IMODIUM A-D) 2 MG tablet Take 2 mg by mouth 4 (four) times daily as needed for diarrhea or loose stools.   losartan (COZAAR) 50 MG tablet Take 50 mg by mouth daily.   metoprolol succinate (TOPROL-XL) 50 MG 24 hr tablet Take 50 mg by mouth daily.   Pediatric Multivitamins-Iron (FLINTSTONES COMPLETE) 18 MG CHEW Chew 1 tablet by mouth daily. Unknown strength   rosuvastatin (CRESTOR) 40 MG tablet TAKE 1 TABLET(40 MG) BY MOUTH AT BEDTIME (Patient taking differently: Take 40 mg by mouth daily.)   triamcinolone cream (KENALOG) 0.1 % Apply 1 application topically daily as needed (spots on legs).      Allergies:   Lisinopril and Mercury   Social History   Socioeconomic History   Marital status: Married    Spouse name: Not on file   Number  of children: Not on file   Years of education: Not on file   Highest education level: Not on file  Occupational History   Not on file  Tobacco Use   Smoking status: Never   Smokeless tobacco: Never  Vaping Use   Vaping Use: Never used  Substance and Sexual Activity   Alcohol use: No   Drug use: Never   Sexual activity: Not on file  Other Topics Concern   Not on file  Social History Narrative   Not on file   Social Determinants of Health   Financial Resource Strain: Not on file  Food Insecurity: Not on file  Transportation Needs: Not on file  Physical Activity: Not on file  Stress: Not on file  Social Connections: Not on file     Family History: The patient's family history includes Breast cancer in her mother; Hypertension in her father and mother; Pancreatic cancer in an other family member. ROS:   Please see the history of  present illness.    All 14 point review of systems negative except as described per history of present illness  EKGs/Labs/Other Studies Reviewed:      Recent Labs: 08/31/2020: TSH 2.514 09/05/2020: ALT 22 09/08/2020: Magnesium 2.1 09/11/2020: B Natriuretic Peptide 110.3; Hemoglobin 9.8; Platelets 245 09/13/2020: BUN 58; Creatinine, Ser 2.44; Potassium 3.6; Sodium 140  Recent Lipid Panel    Component Value Date/Time   CHOL 190 08/31/2020 0930   CHOL 116 12/10/2019 1136   TRIG 61 08/31/2020 0930   HDL 71 08/31/2020 0930   HDL 52 12/10/2019 1136   CHOLHDL 2.7 08/31/2020 0930   VLDL 12 08/31/2020 0930   LDLCALC 107 (H) 08/31/2020 0930   LDLCALC 46 12/10/2019 1136    Physical Exam:    VS:  BP (!) 210/90 (BP Location: Right Arm, Patient Position: Sitting)   Pulse 74   Ht '5\' 2"'$  (1.575 m)   Wt 169 lb (76.7 kg)   SpO2 (!) 74%   BMI 30.91 kg/m     Wt Readings from Last 3 Encounters:  11/26/20 169 lb (76.7 kg)  10/08/20 143 lb 3.2 oz (65 kg)  10/07/20 143 lb (64.9 kg)     GEN:  Well nourished, well developed in no acute distress HEENT: Normal NECK: No JVD; No carotid bruits LYMPHATICS: No lymphadenopathy CARDIAC: RRR, no murmurs, no rubs, no gallops RESPIRATORY:  Clear to auscultation without rales, wheezing or rhonchi  ABDOMEN: Soft, non-tender, non-distended MUSCULOSKELETAL:  No edema; No deformity  SKIN: Warm and dry LOWER EXTREMITIES: no swelling NEUROLOGIC:  Alert and oriented x 3 PSYCHIATRIC:  Normal affect   ASSESSMENT:    1. Peripheral vascular disease ([place])   2. Dyspnea on exertion   3. Atherosclerosis of native coronary artery of native heart without angina pectoris   4. Primary hypertension    PLAN:    In order of problems listed above:  Dyspnea on exertion now she Dyspnea at rest.  Her oxygen saturation was 80s.  She will be brought to the emergency room to evaluate her.  She will require chest x-ray.  Obviously routine blood test need to be done  concerns about her kidney dysfunction.  On top of that she clearly got elevated blood pressure that need to be better managed.  Again the plan is to bring her to the emergency room for evaluation and treatment.  I did talk to ER staff about her arriving there. Essential hypertension which appears to be uncontrolled he now required quick  management there was some period of confusion on top of that she is hypoxic. Coronary disease there was some conversation about potentially doing cardiac catheterization however her creatinine was 4 at that time.  Then we will talk about potentially doing stress test, she did not want to do it last time when I saw her.  I think the key right now is to continue antianginal therapy management of her blood pressure.  Of course troponin I will need to be checked however anticipate this to be elevated.  That may be acute coronary syndrome type II secondary to hypoxia as well as extremely high blood pressure.  Overall is a incredibly complex and difficult situation.  She will be admitted to emergency room for future evaluation   Medication Adjustments/Labs and Tests Ordered: Current medicines are reviewed at length with the patient today.  Concerns regarding medicines are outlined above.  No orders of the defined types were placed in this encounter.  Medication changes: No orders of the defined types were placed in this encounter.   Signed, Park Liter, MD, [facility] 11/26/2020 12:17 PM    [place]

## 2020-11-27 ENCOUNTER — Encounter (HOSPITAL_COMMUNITY): Payer: Self-pay | Admitting: Internal Medicine

## 2020-11-27 ENCOUNTER — Inpatient Hospital Stay (HOSPITAL_COMMUNITY): Payer: Medicare Other

## 2020-11-27 DIAGNOSIS — I5033 Acute on chronic diastolic (congestive) heart failure: Secondary | ICD-10-CM

## 2020-11-27 DIAGNOSIS — J9621 Acute and chronic respiratory failure with hypoxia: Secondary | ICD-10-CM

## 2020-11-27 DIAGNOSIS — J9622 Acute and chronic respiratory failure with hypercapnia: Secondary | ICD-10-CM

## 2020-11-27 DIAGNOSIS — I509 Heart failure, unspecified: Secondary | ICD-10-CM

## 2020-11-27 DIAGNOSIS — E119 Type 2 diabetes mellitus without complications: Secondary | ICD-10-CM

## 2020-11-27 LAB — CBC
HCT: 35.7 % — ABNORMAL LOW (ref 36.0–46.0)
Hemoglobin: 10.4 g/dL — ABNORMAL LOW (ref 12.0–15.0)
MCH: 27.2 pg (ref 26.0–34.0)
MCHC: 29.1 g/dL — ABNORMAL LOW (ref 30.0–36.0)
MCV: 93.5 fL (ref 80.0–100.0)
Platelets: 202 10*3/uL (ref 150–400)
RBC: 3.82 MIL/uL — ABNORMAL LOW (ref 3.87–5.11)
RDW: 15.4 % (ref 11.5–15.5)
WBC: 5.2 10*3/uL (ref 4.0–10.5)
nRBC: 0 % (ref 0.0–0.2)

## 2020-11-27 LAB — ECHOCARDIOGRAM COMPLETE
Area-P 1/2: 2.72 cm2
Height: 62 in
S' Lateral: 2.7 cm
Weight: 2592.61 oz

## 2020-11-27 LAB — BASIC METABOLIC PANEL
Anion gap: 8 (ref 5–15)
BUN: 43 mg/dL — ABNORMAL HIGH (ref 8–23)
CO2: 28 mmol/L (ref 22–32)
Calcium: 8.4 mg/dL — ABNORMAL LOW (ref 8.9–10.3)
Chloride: 104 mmol/L (ref 98–111)
Creatinine, Ser: 2.91 mg/dL — ABNORMAL HIGH (ref 0.44–1.00)
GFR, Estimated: 17 mL/min — ABNORMAL LOW (ref >60)
Glucose, Bld: 80 mg/dL (ref 70–99)
Potassium: 4.3 mmol/L (ref 3.5–5.1)
Sodium: 140 mmol/L (ref 135–145)

## 2020-11-27 LAB — GLUCOSE, CAPILLARY
Glucose-Capillary: 71 mg/dL (ref 70–99)
Glucose-Capillary: 76 mg/dL (ref 70–99)
Glucose-Capillary: 71 mg/dL (ref 70–99)

## 2020-11-27 LAB — CREATININE, URINE, RANDOM: Creatinine, Urine: 20.16 mg/dL

## 2020-11-27 LAB — MRSA NEXT GEN BY PCR, NASAL: MRSA by PCR Next Gen: NOT DETECTED

## 2020-11-27 LAB — HEMOGLOBIN A1C
Hgb A1c MFr Bld: 8.2 % — ABNORMAL HIGH (ref 4.8–5.6)
Mean Plasma Glucose: 188.64 mg/dL

## 2020-11-27 LAB — TROPONIN I (HIGH SENSITIVITY): Troponin I (High Sensitivity): 123 ng/L (ref <18)

## 2020-11-27 LAB — SODIUM, URINE, RANDOM: Sodium, Ur: 123 mmol/L

## 2020-11-27 LAB — HEPARIN LEVEL (UNFRACTIONATED): Heparin Unfractionated: 0.72 IU/mL — ABNORMAL HIGH (ref 0.30–0.70)

## 2020-11-27 MED ORDER — CARVEDILOL 3.125 MG PO TABS: 3.1250 mg | ORAL_TABLET | Freq: Two times a day (BID) | ORAL | Status: DC

## 2020-11-27 MED ORDER — HYDRALAZINE HCL 50 MG PO TABS
50.0000 mg | ORAL_TABLET | Freq: Three times a day (TID) | ORAL | Status: DC
  Administered 2020-11-27 – 2020-12-03 (×18): 50 mg via ORAL
  Filled 2020-11-27 (×19): qty 1

## 2020-11-27 MED ORDER — CARVEDILOL 12.5 MG PO TABS: 12.5000 mg | ORAL_TABLET | Freq: Two times a day (BID) | ORAL | Status: DC

## 2020-11-27 MED ORDER — AMOXICILLIN-POT CLAVULANATE 875-125 MG PO TABS: 1.0000 | ORAL_TABLET | Freq: Two times a day (BID) | ORAL | Status: DC

## 2020-11-27 MED ORDER — HYDRALAZINE HCL 25 MG PO TABS: 25.0000 mg | ORAL_TABLET | Freq: Three times a day (TID) | ORAL | Status: DC

## 2020-11-27 NOTE — Progress Notes (Signed)
Pt on 3LPM [place] at this time at bedside receiving bath and using commode.  Pt in no distress requiring bipap at this time.  Pt states she would not like to wear it and she took it off it as soon as possible last night.  Device in room overnight so if she may needed it.  RT will continue to monitor and pull device tomorrow if pt is able to get through tonight without use.

## 2020-11-27 NOTE — Progress Notes (Signed)
Pt slept throughout the night with bipap maintained , Tele- SB.

## 2020-11-27 NOTE — Progress Notes (Signed)
PROGRESS NOTE    Molly [name]  [id number] DOB: 02/19/1951 DOA: 11/26/2020 PCP: [name], MD    Chief Complaint  Patient presents with   Shortness of Breath    Brief Narrative:  Molly [name] is a 70 y.o. female with medical history significant for DMT2, HFpEF, HTN, HLD, PVD, CKD 4 who was transferred to [facility] from [facility] ER due to CHF exacerbation and respiratory failure with hypercapnia and hypoxia. Molly [name] went to her scheduled appointment at her doctors office today and was noted to have an O2 saturation of 78% on RA. She reports she has redness of breath has been worse with exertion over the last 2 to 3 weeks.   In the emergency room patient was found to be hypoxic on room air in the low 80s.  She was found to have respiratory acidosis as well as hypercapnia and hypoxia on ABG.  She was started on BiPAP therapy which she is tolerating well.  She is found to have a elevated BNP.  Patient was transferred to [facility] for admission. She was also found to have elevated d dimer and hypoglycemia.  She is being treated for acute on chronic diastolic heart failure with IV lasix and BIPAP.    Assessment & Plan:   Principal Problem:   Acute on chronic heart failure with preserved ejection fraction (HFpEF) (HCC) Active Problems:   HTN (hypertension)   Type 2 diabetes mellitus with stage 4 chronic kidney disease, with long-term current use of insulin (HCC)   Acute on chronic respiratory failure with hypoxia and hypercapnia (HCC)   Stage 4 chronic kidney disease (HCC)   Elevated d-dimer   Prolonged QT interval   Acute respiratory failure with hypoxia and hypercarbia probably secondary to acute on chronic diastolic heart failure Transiently required BiPAP, currently on 2 L of nasal cannula oxygen. Recommend continue BiPAP tonight and repeat ABG in the morning. Continue with IV Lasix 40 mg twice daily. Slight worsening of renal parameters from creatinine of  2.7-2.9 Nephrology consulted for further recommendations. Continue with strict intake and output and daily weights Echocardiogram ordered and results are pending   Stage IV CKD Baseline creatinine around 2.4, admitted with a creatinine of 2.7 and worsened to 2.9 today Urine output about 1300 mL the last 24 hours    Essential hypertension Blood pressure parameters are suboptimal Restart the patient's Coreg and increase hydralazine to 3 times daily.    Type 2 diabetes mellitus with stage IV CKD HemoGlobin A1c around 8.2 CBG (last 3)  Recent Labs    11/26/20 1750 11/27/20 0425 11/27/20 1116  GLUCAP 91 71 76   Resume SSI.  Non insulin dependent.    Elevated d dimer and dyspnea:  VQ scan ordered for evaluation of PE and on IV heparin empirically.    Anemia of chronic disease Normocytic anemia hemoglobin stable around 10        DVT prophylaxis: (heparin)  Code Status: (full code.  Family Communication: None at bedside.  Disposition:   Status is: Inpatient  Remains inpatient appropriate because:Ongoing diagnostic testing needed not appropriate for outpatient work up and IV treatments appropriate due to intensity of illness or inability to take PO  Dispo: The patient is from: Home              Anticipated d/c is to: Home              Patient currently is not medically stable to d/c.  Difficult to place patient No       Consultants:  Nephrology.   Procedures: echocardiogram.   Antimicrobials: none.    Subjective: Pt is alert and answering questions appropriately.   Objective: Vitals:   11/27/20 0819 11/27/20 0827 11/27/20 1115 11/27/20 1215  BP:   (!) 148/78 (!) 160/97  Pulse: 65  (!) 51 66  Resp: '16  16 17  '$ Temp:   98.7 F (37.1 C)   TempSrc:   Oral   SpO2: 100% 99% 100% 100%  Weight:        Intake/Output Summary (Last 24 hours) at 11/27/2020 1428 Last data filed at 11/27/2020 1258 Gross per 24 hour  Intake 200 ml  Output 1500 ml  Net  -1300 ml   Filed Weights   11/27/20 0426  Weight: 73.5 kg    Examination:  General exam: Appears calm and comfortable  Respiratory system: diminished at bases, on 2 lit of [place] oxygen.  Cardiovascular system: S1 & S2 heard, RRR. No JVD,  No pedal edema. Gastrointestinal system: Abdomen is nondistended, soft and nontender. . Normal bowel sounds heard. Central nervous system: Alert and oriented. No focal neurological deficits. Extremities: Symmetric 5 x 5 power. Skin: No rashes, lesions or ulcers Psychiatry:  Mood & affect appropriate.     Data Reviewed: I have personally reviewed following labs and imaging studies  CBC: Recent Labs  Lab 11/26/20 1226 11/26/20 1303 11/26/20 1755 11/27/20 0450  WBC 5.2  --   --  5.2  NEUTROABS 3.2  --   --   --   HGB 11.7* 12.2 11.9* 10.4*  HCT 39.1 36.0 35.0* 35.7*  MCV 92.2  --   --  93.5  PLT 228  --   --  [id number]    Basic Metabolic Panel: Recent Labs  Lab 11/26/20 1226 11/26/20 1303 11/26/20 1755 11/27/20 0450  NA 139 141 141 140  K 4.3 4.2 4.3 4.3  CL 101  --   --  104  CO2 27  --   --  28  GLUCOSE 56*  --   --  80  BUN 48*  --   --  43*  CREATININE 2.77*  --   --  2.91*  CALCIUM 8.7*  --   --  8.4*    GFR: Estimated Creatinine Clearance: 17.4 mL/min (A) (by C-G formula based on SCr of 2.91 mg/dL (H)).  Liver Function Tests: Recent Labs  Lab 11/26/20 1226  AST 59*  ALT 80*  ALKPHOS 66  BILITOT 0.5  PROT 7.6  ALBUMIN 4.1    CBG: Recent Labs  Lab 11/26/20 1628 11/26/20 1701 11/26/20 1750 11/27/20 0425 11/27/20 1116  GLUCAP 70 73 91 71 76     Recent Results (from the past 240 hour(s))  Resp Panel by RT-PCR (Flu A&B, Covid) Nasopharyngeal Swab     Status: None   Collection Time: 11/26/20 12:26 PM   Specimen: Nasopharyngeal Swab; Nasopharyngeal(NP) swabs in vial transport medium  Result Value Ref Range Status   SARS Coronavirus 2 by RT PCR NEGATIVE NEGATIVE Final    Comment: (NOTE) SARS-CoV-2 target  nucleic acids are NOT DETECTED.  The SARS-CoV-2 RNA is generally detectable in upper respiratory specimens during the acute phase of infection. The lowest concentration of SARS-CoV-2 viral copies this assay can detect is 138 copies/mL. A negative result does not preclude SARS-Cov-2 infection and should not be used as the sole basis for treatment or other patient management decisions. A negative result may occur  with  improper specimen collection/handling, submission of specimen other than nasopharyngeal swab, presence of viral mutation(s) within the areas targeted by this assay, and inadequate number of viral copies(<138 copies/mL). A negative result must be combined with clinical observations, patient history, and epidemiological information. The expected result is Negative.  Fact Sheet for Patients:  EntrepreneurPulse.com.au  Fact Sheet for Healthcare Providers:  IncredibleEmployment.be  This test is no t yet approved or cleared by the [place] FDA and  has been authorized for detection and/or diagnosis of SARS-CoV-2 by FDA under an Emergency Use Authorization (EUA). This EUA will remain  in effect (meaning this test can be used) for the duration of the COVID-19 declaration under Section 564(b)(1) of the Act, 21 U.S.C.section 360bbb-3(b)(1), unless the authorization is terminated  or revoked sooner.       Influenza A by PCR NEGATIVE NEGATIVE Final   Influenza B by PCR NEGATIVE NEGATIVE Final    Comment: (NOTE) The Xpert Xpress SARS-CoV-2/FLU/RSV plus assay is intended as an aid in the diagnosis of influenza from Nasopharyngeal swab specimens and should not be used as a sole basis for treatment. Nasal washings and aspirates are unacceptable for Xpert Xpress SARS-CoV-2/FLU/RSV testing.  Fact Sheet for Patients: EntrepreneurPulse.com.au  Fact Sheet for Healthcare  Providers: IncredibleEmployment.be  This test is not yet approved or cleared by the [place] FDA and has been authorized for detection and/or diagnosis of SARS-CoV-2 by FDA under an Emergency Use Authorization (EUA). This EUA will remain in effect (meaning this test can be used) for the duration of the COVID-19 declaration under Section 564(b)(1) of the Act, 21 U.S.C. section 360bbb-3(b)(1), unless the authorization is terminated or revoked.  Performed at [facility], [address] [place], [place] [address]   MRSA Next Gen by PCR, Nasal     Status: None   Collection Time: 11/26/20 11:14 PM   Specimen: Nasal Mucosa; Nasal Swab  Result Value Ref Range Status   MRSA by PCR Next Gen NOT DETECTED NOT DETECTED Final    Comment: (NOTE) The GeneXpert MRSA Assay (FDA approved for NASAL specimens only), is one component of a comprehensive MRSA colonization surveillance program. It is not intended to diagnose MRSA infection nor to guide or monitor treatment for MRSA infections. Test performance is not FDA approved in patients less than 29 years old. Performed at [facility], [place] [address], [place], [place] [address]          Radiology Studies: DG Chest Port 1 View  Result Date: 11/26/2020 CLINICAL DATA:  SOB (shortness of breath) R06.02 (ICD-10-CM) EXAM: PORTABLE CHEST 1 VIEW COMPARISON:  October 08, 2020. FINDINGS: Low lung volumes with increased left greater than right basilar opacities. No visible pleural effusions or pneumothorax. Cardiomediastinal silhouette is mildly enlarged, likely accentuated by low lung volumes and AP technique. No acute osseous abnormality. IMPRESSION: Low lung volumes with increased left greater than right basilar opacities, suspicious for pneumonia. Electronically Signed   By: Margaretha Sheffield M.D.   On: 11/26/2020 13:42   ECHOCARDIOGRAM COMPLETE  Result Date: 11/27/2020    ECHOCARDIOGRAM REPORT   Patient Name:    KWEEN ANGRISANI Date of Exam: 11/27/2020 Medical Rec #:  [id number]    Height:       62.0 in Accession #:    [id number]   Weight:       162.0 lb Date of Birth:  1950/07/26    BSA:          1.748  m Patient Age:    69 years     BP:           148/78 mmHg Patient Gender: F            HR:           51 bpm. Exam Location:  Inpatient Procedure: 2D Echo, Cardiac Doppler and Color Doppler Indications:    CHF  History:        Patient has prior history of Echocardiogram examinations, most                 recent 08/31/2020. CHF, PVD, Signs/Symptoms:Shortness of Breath                 and Resp. failure, CKD; Risk Factors:Diabetes, Hypertension,                 Dyslipidemia and Obesity.  Sonographer:    Dustin Flock RDCS Referring Phys: [id number] [place]  1. Left ventricular ejection fraction, by estimation, is 60 to 65%. The left ventricle has normal function. The left ventricle has no regional wall motion abnormalities. There is mild left ventricular hypertrophy. Left ventricular diastolic parameters are consistent with Grade II diastolic dysfunction (pseudonormalization). Elevated left atrial pressure.  2. Right ventricular systolic function is normal. The right ventricular size is normal. There is normal pulmonary artery systolic pressure.  3. A small pericardial effusion is present.  4. The mitral valve is normal in structure. No evidence of mitral valve regurgitation. No evidence of mitral stenosis.  5. The aortic valve is tricuspid. Aortic valve regurgitation is not visualized. Mild aortic valve sclerosis is present, with no evidence of aortic valve stenosis.  6. The inferior vena cava is normal in size with greater than 50% respiratory variability, suggesting right atrial pressure of 3 mmHg. FINDINGS  Left Ventricle: Left ventricular ejection fraction, by estimation, is 60 to 65%. The left ventricle has normal function. The left ventricle has no regional wall motion abnormalities. The left ventricular  internal cavity size was normal in size. There is  mild left ventricular hypertrophy. Left ventricular diastolic parameters are consistent with Grade II diastolic dysfunction (pseudonormalization). Elevated left atrial pressure. Right Ventricle: The right ventricular size is normal. Right ventricular systolic function is normal. There is normal pulmonary artery systolic pressure. The tricuspid regurgitant velocity is 2.70 m/s, and with an assumed right atrial pressure of 3 mmHg,  the estimated right ventricular systolic pressure is [id number] mmHg. Left Atrium: Left atrial size was normal in size. Right Atrium: Right atrial size was normal in size. Pericardium: A small pericardial effusion is present. Mitral Valve: The mitral valve is normal in structure. Mild mitral annular calcification. No evidence of mitral valve regurgitation. No evidence of mitral valve stenosis. Tricuspid Valve: The tricuspid valve is normal in structure. Tricuspid valve regurgitation is mild . No evidence of tricuspid stenosis. Aortic Valve: The aortic valve is tricuspid. Aortic valve regurgitation is not visualized. Mild aortic valve sclerosis is present, with no evidence of aortic valve stenosis. Pulmonic Valve: The pulmonic valve was not well visualized. Pulmonic valve regurgitation is trivial. No evidence of pulmonic stenosis. Aorta: The aortic root is normal in size and structure. Venous: The inferior vena cava is normal in size with greater than 50% respiratory variability, suggesting right atrial pressure of 3 mmHg. IAS/Shunts: No atrial level shunt detected by color flow Doppler.  LEFT VENTRICLE PLAX 2D LVIDd:         3.70 cm  Diastology LVIDs:  2.70 cm  LV e' medial:    3.92 cm/s LV PW:         1.20 cm  LV E/e' medial:  23.8 LV IVS:        1.40 cm  LV e' lateral:   3.81 cm/s LVOT diam:     1.90 cm  LV E/e' lateral: 24.5 LV SV:         60 LV SV Index:   34 LVOT Area:     2.84 cm  RIGHT VENTRICLE RV Basal diam:  2.90 cm RV S prime:      7.72 cm/s TAPSE (M-mode): 1.8 cm LEFT ATRIUM           Index       RIGHT ATRIUM           Index LA diam:      4.30 cm 2.46 cm/m  RA Area:     15.90 cm LA Vol (A2C): 29.9 ml 17.10 ml/m RA Volume:   38.30 ml  21.91 ml/m LA Vol (A4C): 57.0 ml 32.61 ml/m  AORTIC VALVE LVOT Vmax:   84.90 cm/s LVOT Vmean:  53.800 cm/s LVOT VTI:    0.211 m  AORTA Ao Root diam: 3.10 cm MITRAL VALVE               TRICUSPID VALVE MV Area (PHT): 2.72 cm    TR Peak grad:   29.2 mmHg MV Decel Time: 279 msec    TR Vmax:        270.00 cm/s MV E velocity: 93.40 cm/s MV A velocity: 92.10 cm/s  SHUNTS MV E/A ratio:  1.01        Systemic VTI:  0.21 m                            Systemic Diam: 1.90 cm Kirk Ruths MD Electronically signed by Kirk Ruths MD Signature Date/Time: 11/27/2020/1:14:38 PM    Final         Scheduled Meds:  atorvastatin  40 mg Oral Daily   carvedilol  12.5 mg Oral BID WC   ezetimibe  10 mg Oral Daily   furosemide  40 mg Intravenous Q12H   hydrALAZINE  25 mg Oral Q8H   losartan  50 mg Oral Daily   sodium chloride flush  3 mL Intravenous Q12H   Continuous Infusions:  sodium chloride     heparin Stopped (11/27/20 1400)     LOS: 1 day        Hosie Poisson, MD [facility]   To contact the attending provider between 7A-7P or the covering provider during after hours 7P-7A, please log into the web site [email or web address] and access using universal [place] password for that web site. If you do not have the password, please call the hospital operator.  11/27/2020, 2:28 PM

## 2020-11-27 NOTE — Progress Notes (Signed)
  Echocardiogram 2D Echocardiogram has been performed.  Molly [name] 11/27/2020, 11:44 AM

## 2020-11-27 NOTE — Progress Notes (Signed)
[place] for heparin Indication:  rule out pulmonary embolus  Allergies  Allergen Reactions   Lisinopril Other (See Comments)   [place] Nausea And Vomiting    Patient Measurements: Weight: 73.5 kg (162 lb 0.6 oz) Heparin Dosing Weight: 67kg  Vital Signs: Temp: 98.3 F (36.8 C) (08/13 0419) Temp Source: Axillary (08/13 0419) BP: 171/51 (08/13 0426) Pulse Rate: 49 (08/13 0426)  Labs: Recent Labs    11/26/20 1226 11/26/20 1303 11/26/20 1755 11/27/20 0450  HGB 11.7* 12.2 11.9* 10.4*  HCT 39.1 36.0 35.0* 35.7*  PLT 228  --   --  202  HEPARINUNFRC  --   --   --  0.72*  CREATININE 2.77*  --   --  2.91*  TROPONINIHS  --   --   --  123*     Estimated Creatinine Clearance: 17.4 mL/min (A) (by C-G formula based on SCr of 2.91 mg/dL (H)).  Assessment: 70 y.o. female with possible PE for heparin  Goal of Therapy:  Heparin level 0.3-0.7 units/ml Monitor platelets by anticoagulation protocol: Yes   Plan:  Decrease Heparin 1000 units/hr F/U VQ scan results  Phillis Knack, PharmD, BCPS  11/27/2020 5:58 AM

## 2020-11-27 NOTE — Consult Note (Addendum)
Renal Service Consult Note [facility]  Molly [name] 11/27/2020 Molly Blazing, MD Requesting Physician: Dr. Karleen Hampshire  Reason for Consult: Renal failure HPI: The patient is a 70 y.o. year-old w/ hx of HTN, CKD 3, PAD, DM2, HTN urgency, DKA, HHD, neuropathy presented w/ resp failure to [facility] ER w/ hypoxia and hypercapnia. She was admitted at [facility] yesterday evening. She had PCP appt and she c/o SOB and they checked her SpO2 which was 78% on RA.  She denies any edema. She did say she was admitted to [facility] in May 2022 and had similar issues. In ED pt was hypoxic in the 80's, started on Bipap. BNP was up. BNP 1690. CBC neg, CXR showed no edema, bibasilar infiltrates vs atx. She rec'd IV lasix. CTA chest could not be done due to CKD. V/Q scan has been ordered, not completed yet. With 74m IV lasix x 2 she has voided 2.2 L and SOB is "much better".  Asked to see for renal failure.    Pt states she was in hospital in May 2022.  Renal team saw patient during that admit w/ dx of AKI on CKD 3b likely due to ischemic ATN/ overcorrection of BP w/ some low BP's. Renal artery PVL showed 9 cm L and 11 cm R kidney, but no left RAS. B/l creat around 1.1- 1.4.  Her renal failure was felt to be due to cardiorenal syndrome (diast CHF). She required IV lasix 80 bid and improved, rec'd bipap initially.   Patient [place] him once w/o any sig changes in manangement.  She has not been on lasix at home since May admit.    ROS - denies CP, no joint pain, no HA, no blurry vision, no rash, no diarrhea, no nausea/ vomiting, no dysuria, no difficulty voiding   Past Medical History  Past Medical History:  Diagnosis Date   Abdominal aortic atherosclerosis with stenosis 06/29/2015   AKI (acute kidney injury) ([place] 06/29/2015   Atherosclerosis of coronary artery without angina pectoris 04/21/2020   Atopic dermatitis 04/21/2020   Benign  hypertension with CKD (chronic kidney disease) stage III ([place] 04/21/2020   Bilateral impacted cerumen 08/21/2019   Chest pain 08/31/2020   Cholecystitis 06/29/2015   Cholelithiasis 06/29/2015   Claudication in peripheral vascular disease ([place] 08/28/2019   Peripheral arterial disease   Colon cancer screening 04/21/2020   Dehydration with hyponatremia 06/29/2015   Diabetes mellitus ([place] 07/15/2018   Diarrhea 04/21/2020   DKA (diabetic ketoacidoses) 06/29/2015   Dyslipidemia 08/30/2018   Elevated troponin    Epigastric pain 04/21/2020   Gastroesophageal reflux disease 04/21/2020   HTN (hypertension) 06/29/2015   Hyperglycemia due to type 2 diabetes mellitus ([place] 04/21/2020   Hypertension    Hypertensive emergency 07/12/2020   Hypertensive heart disease without congestive heart failure 04/21/2020   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   Proptosis 04/21/2020  Proximal leg weakness 09/10/2018   Ptosis of left eyelid 09/10/2018   Renal artery stenosis ([place]) 12/17/2019   Renal artery stenosis   Standard chest x-ray abnormal 04/21/2020   Vitamin D deficiency 04/21/2020   Weight loss 04/21/2020   Past Surgical History  Past Surgical History:  Procedure Laterality Date   ABDOMINAL AORTOGRAM W/LOWER EXTREMITY N/A 08/28/2019   Procedure: ABDOMINAL AORTOGRAM W/LOWER EXTREMITY;  Surgeon: Lorretta Harp, MD;  Location: [place] [facility];  Service: Cardiovascular;  Laterality:  N/A;   CATARACT EXTRACTION, BILATERAL     CHOLECYSTECTOMY N/A 06/30/2015   Procedure: LAPAROSCOPIC CHOLECYSTECTOMY WITH INTRAOPERATIVE CHOLANGIOGRAM;  Surgeon: Donnie Mesa, MD;  Location: [employer] [facility];  Service: General;  Laterality: N/A;   PERIPHERAL VASCULAR BALLOON ANGIOPLASTY Right 08/28/2019   Procedure: PERIPHERAL VASCULAR BALLOON ANGIOPLASTY;  Surgeon: Lorretta Harp, MD;  Location: [place] [facility];  Service: Cardiovascular;  Laterality: Right;  attempted SFA   Family History  Family History  Problem Relation Age of Onset   Breast cancer Mother    Hypertension Mother    Hypertension Father    Pancreatic cancer Other        uncle   Social History  reports that she has never smoked. She has never used smokeless tobacco. She reports that she does not drink alcohol and does not use drugs. Allergies  Allergies  Allergen Reactions   Lisinopril Other (See Comments)   Mercury Nausea And Vomiting   Home medications Prior to Admission medications   Medication Sig Start Date End Date Taking? Authorizing Provider  Carboxymethylcell-Hypromellose (GENTEAL OP) Place 1 drop into both eyes daily as needed (dry eyes).   Yes [provider]  carvedilol (COREG) 25 MG tablet Take 1 tablet (25 mg total) by mouth 2 (two) times daily with a meal. 10/28/20 07/25/21 Yes Park Liter, MD  Cholecalciferol 25 MCG (1000 UT) tablet Take 2,000 Units by mouth daily.   Yes [provider]  cilostazol (PLETAL) 50 MG tablet Take 1 tablet (50 mg total) by mouth 2 (two) times daily. 03/15/20  Yes Lorretta Harp, MD  dicyclomine (BENTYL) 10 MG capsule Take 10 mg by mouth 2 (two) times daily.   Yes [provider]  diphenhydrAMINE (BENADRYL) 25 MG tablet Take 25 mg by mouth daily.   Yes [provider]  ezetimibe (ZETIA) 10 MG tablet Take 1 tablet (10 mg total) by mouth daily. 10/28/20 07/25/21 Yes Park Liter, MD  famotidine (PEPCID) 20 MG tablet Take 20 mg by mouth  in the morning and at bedtime.   Yes [provider]  hydrALAZINE (APRESOLINE) 25 MG tablet Take 1 tablet (25 mg total) by mouth every 12 (twelve) hours. 10/28/20 07/25/21 Yes Park Liter, MD  insulin isophane & regular human (HUMULIN 70/30 MIX) (70-30) 100 UNIT/ML KwikPen Inject 16-20 Units into the skin daily. 06/26/18  Yes [provider]  ketoconazole (NIZORAL) 2 % cream Apply 1 application topically daily as needed for irritation.  05/08/19  Yes [provider]  levocetirizine (XYZAL) 5 MG tablet Take 5 mg by mouth daily as needed for allergies. 05/12/19  Yes [provider]  loperamide (IMODIUM A-D) 2 MG tablet Take 2 mg by mouth 4 (four) times daily as needed for diarrhea or loose stools.   Yes [provider]  Pediatric Multivitamins-Iron (FLINTSTONES COMPLETE) 18 MG CHEW Chew 1 tablet by mouth daily. Unknown strength   Yes [provider]  rosuvastatin (CRESTOR) 40 MG tablet TAKE 1  TABLET(40 MG) BY MOUTH AT BEDTIME Patient taking differently: Take 40 mg by mouth daily. 05/10/20  Yes Park Liter, MD  triamcinolone cream (KENALOG) 0.1 % Apply 1 application topically daily as needed (spots on legs).    Yes [provider]     Vitals:   11/27/20 1215 11/27/20 1558 11/27/20 1910 11/27/20 1945  BP: (!) 160/97 (!) 163/66  (!) 149/59  Pulse: 66 (!) 51 64 66  Resp: 17 18 19 20   Temp:  98.7 F (37.1 C)  98.9 F (37.2 C)  TempSrc:  Oral  Oral  SpO2: 100% 100% 100% 99%  Weight:       Exam Gen alert, no distress No rash, cyanosis or gangrene Sclera anicteric, throat clear  No jvd or bruits Chest clear bilat to bases, no rales/ wheezing RRR no MRG Abd soft ntnd no mass or ascites +bs GU normal MS no joint effusions or deformity Ext no LE or UE edema, no wounds or ulcers Neuro is alert, Ox 3 , nf    Home meds include coreg, pletal, pepcid, humulin 70/30 insulin, crestor, zetia, hydralazine     Date   Creat    eGFR    2017   1.92 >> 0.79    2019   0.91- 0.99    2020   1.05 Sep 2019  1.08- 1.49 42- 62 ml/min, IIIb    Feb 2022  1.33  41 ml/min, stage IIIb    May 17- 30, 2022  1.38- 3.13 16- 42    Aug 12  2.77  18    Nov 27, 2020 2.91  17    UA pend    UNa, UCr pend    Renal US - pending     CXR 11/26/20 - IMPRESSION: Low lung volumes with increased left greater than right basilar opacities, suspicious for pneumonia.   Assessment/ Plan: AKI on CKD IIIb - vs progression to CKD IV.  B/l creatinine from 2021 was 1.1- 1.4, eGFR 41- 60 ml/min. In 2022 creat is worse up to high 2's now.  She has progressive renal failure which is likely due to HTN and cardiorenal syndrome. Will get UA and urine lytes and renal US. Would continue IV lasix 40 bid. [place] Would consider getting CHF team involved while here given the severity of her resp failure episodes.  Also will need maintenance loop diuretic when dc'd this time. D/w pt and husband and questions answered. Will follow.  Acute resp failure - suspected due to a/c diast CHF exacerbation. Improving w/ IV lasix 40 bid and 2.2 L UOP so far, feeling better. CXR w/o edema, which is unusual, especially given her severe hypoxemia and hypercarbia. She has similar admit in May 2022. She was seen by pulmonary in OP setting after that admit.   DM2 - on long-term insulin HTN - hold ARB for now, use other agents non-ACEi/ ARB while diuresing.       Rob Chick Cousins  MD 11/27/2020, 8:12 PM  Recent Labs  Lab 11/26/20 1226 11/26/20 1303 11/26/20 1755 11/27/20 0450  WBC 5.2  --   --  5.2  HGB 11.7*   < > 11.9* 10.4*   < > = values in this interval not displayed.   Recent Labs  Lab 11/26/20 1226 11/26/20 1303 11/26/20 1755 11/27/20 0450  K 4.3   < > 4.3 4.3  BUN 48*  --   --  43*  CREATININE  2.77*  --   --  2.91*  CALCIUM 8.7*  --   --  8.4*   < > = values in this interval not displayed.

## 2020-11-28 DIAGNOSIS — E1122 Type 2 diabetes mellitus with diabetic chronic kidney disease: Secondary | ICD-10-CM

## 2020-11-28 DIAGNOSIS — I10 Essential (primary) hypertension: Secondary | ICD-10-CM

## 2020-11-28 DIAGNOSIS — I5033 Acute on chronic diastolic (congestive) heart failure: Secondary | ICD-10-CM | POA: Diagnosis not present

## 2020-11-28 DIAGNOSIS — Z794 Long term (current) use of insulin: Secondary | ICD-10-CM

## 2020-11-28 DIAGNOSIS — N184 Chronic kidney disease, stage 4 (severe): Secondary | ICD-10-CM

## 2020-11-28 LAB — CBC
HCT: 37.5 % (ref 36.0–46.0)
Hemoglobin: 11.2 g/dL — ABNORMAL LOW (ref 12.0–15.0)
MCH: 27.6 pg (ref 26.0–34.0)
MCHC: 29.9 g/dL — ABNORMAL LOW (ref 30.0–36.0)
MCV: 92.4 fL (ref 80.0–100.0)
Platelets: 200 10*3/uL (ref 150–400)
RBC: 4.06 MIL/uL (ref 3.87–5.11)
RDW: 15.2 % (ref 11.5–15.5)
WBC: 5.6 10*3/uL (ref 4.0–10.5)
nRBC: 0 % (ref 0.0–0.2)

## 2020-11-28 LAB — URINALYSIS, ROUTINE W REFLEX MICROSCOPIC
Bilirubin Urine: NEGATIVE
Glucose, UA: NEGATIVE mg/dL
Ketones, ur: NEGATIVE mg/dL
Nitrite: NEGATIVE
Protein, ur: NEGATIVE mg/dL
RBC / HPF: >50 RBC/hpf — ABNORMAL HIGH (ref 0–5)
Specific Gravity, Urine: 1.006 (ref 1.005–1.030)
pH: 6 (ref 5.0–8.0)

## 2020-11-28 LAB — GLUCOSE, CAPILLARY
Glucose-Capillary: 118 mg/dL — ABNORMAL HIGH (ref 70–99)
Glucose-Capillary: 104 mg/dL — ABNORMAL HIGH (ref 70–99)
Glucose-Capillary: 121 mg/dL — ABNORMAL HIGH (ref 70–99)
Glucose-Capillary: 137 mg/dL — ABNORMAL HIGH (ref 70–99)
Glucose-Capillary: 157 mg/dL — ABNORMAL HIGH (ref 70–99)

## 2020-11-28 LAB — BASIC METABOLIC PANEL
Anion gap: 10 (ref 5–15)
BUN: 40 mg/dL — ABNORMAL HIGH (ref 8–23)
CO2: 31 mmol/L (ref 22–32)
Calcium: 8.4 mg/dL — ABNORMAL LOW (ref 8.9–10.3)
Chloride: 100 mmol/L (ref 98–111)
Creatinine, Ser: 2.85 mg/dL — ABNORMAL HIGH (ref 0.44–1.00)
GFR, Estimated: 17 mL/min — ABNORMAL LOW (ref >60)
Glucose, Bld: 113 mg/dL — ABNORMAL HIGH (ref 70–99)
Potassium: 3.8 mmol/L (ref 3.5–5.1)
Sodium: 141 mmol/L (ref 135–145)

## 2020-11-28 LAB — HEPARIN LEVEL (UNFRACTIONATED): Heparin Unfractionated: 0.52 IU/mL (ref 0.30–0.70)

## 2020-11-28 MED ORDER — ASPIRIN EC 81 MG PO TBEC
81.0000 mg | DELAYED_RELEASE_TABLET | Freq: Every day | ORAL | Status: DC
  Administered 2020-11-29 – 2020-12-03 (×5): 81 mg via ORAL
  Filled 2020-11-28 (×5): qty 1

## 2020-11-28 MED ORDER — FUROSEMIDE 10 MG/ML IJ SOLN
40.0000 mg | Freq: Two times a day (BID) | INTRAMUSCULAR | Status: DC
  Administered 2020-11-28: 40 mg via INTRAVENOUS
  Filled 2020-11-28: qty 4

## 2020-11-28 MED ORDER — CARVEDILOL 6.25 MG PO TABS: 6.2500 mg | ORAL_TABLET | Freq: Two times a day (BID) | ORAL | Status: DC

## 2020-11-28 NOTE — Evaluation (Signed)
Physical Therapy Evaluation Patient Details Name: Molly [name] MRN: [id number] DOB: [birth year] Today's Date: 11/28/2020   History of Present Illness  Molly [name] is a 70 y.o. female who was transferred to [facility] from [facility] ER due to CHF exacerbation and respiratory failure with hypercapnia and hypoxia. [name]. PMH: DMT2, HFpEF, HTN, HLD, PVD, CKD 4  Clinical Impression  Pt admitted with above diagnosis. Ambulates with mild instability, using a rolling walker 95 feet today. SpO2 96% on 1L supplemental O2, 88% on room air. VSS otherwise. Motivated, and previously active in the community. Good family support, husband present during evaluation. Pt currently with functional limitations due to the deficits listed below (see PT Problem List). Pt will benefit from skilled PT to increase their independence and safety with mobility to allow discharge to the venue listed below.       Follow Up Recommendations Home health PT    Equipment Recommendations  None recommended by PT    Recommendations for Other Services       Precautions / Restrictions Precautions Precautions: Fall;Other (comment) Precaution Comments: monitor O2 (stopped using continuous O2 around June, but kept a tank for a whille longer to use PRN.) Restrictions Weight Bearing Restrictions: No      Mobility  Bed Mobility Overal bed mobility: Needs Assistance Bed Mobility: Supine to Sit     Supine to sit: Min guard     General bed mobility comments: min guard for safety. Slow and effortful for pt but completed herself this afternoon with only min guard.    Transfers Overall transfer level: Needs assistance Equipment used: Rolling walker (2 wheeled) Transfers: Sit to/from Stand Sit to Stand: Min guard         General transfer comment: Min guard for safety. Cues for hand placement and technique, not to pull through RW. Slow and effortful but performed without  physical assistance.  Ambulation/Gait Ambulation/Gait assistance: Supervision Gait Distance (Feet): 95 Feet Assistive device: Rolling walker (2 wheeled) Gait Pattern/deviations: Decreased stride length Gait velocity: slow Gait velocity interpretation: <1.8 ft/sec, indicate of risk for recurrent falls General Gait Details: Slow gait speed, good control of walker, slight instability noted with turns. Educated on safe DME use with RW and cues for proximity. Supervision for safety, therapist managed lines/leads while monitoring VS. SpO2 96% on 1L supplemental O2. 88% on RA.  Stairs            Wheelchair Mobility    Modified Rankin (Stroke Patients Only)       Balance Overall balance assessment: Needs assistance;History of Falls Sitting-balance support: No upper extremity supported;Feet supported Sitting balance-Leahy Scale: Fair     Standing balance support: During functional activity;No upper extremity supported Standing balance-Leahy Scale: Fair                               Pertinent Vitals/Pain Pain Assessment: No/denies pain    Home Living Family/patient expects to be discharged to:: Private residence Living Arrangements: Spouse/significant other Available Help at Discharge: Family;Available 24 hours/day Type of Home: House Home Access: Stairs to enter Entrance Stairs-Rails: Psychiatric nurse of Steps: 2 Home Layout: One level Home Equipment: Bedside commode;Shower seat;Grab bars - tub/shower;Cane - single point;Walker - 2 wheels      Prior Function Level of Independence: Needs assistance   Gait / Transfers Assistance Needed: reports ambulatory without AD though will occasionally  hold to  husband's arm out in community  ADL's / Homemaking Assistance Needed: Reports able to complete basic ADLs but husband nearby to assist as needed. Husband has been completing IADLs, driving and grocery shopping. 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, tutors adults  in free time  Comments: Endorses "a couple" of falls since discharge in May     Hand Dominance   Dominant Hand: Right    Extremity/Trunk Assessment   Upper Extremity Assessment Upper Extremity Assessment: Defer to OT evaluation    Lower Extremity Assessment Lower Extremity Assessment: Generalized weakness    Cervical / Trunk Assessment Cervical / Trunk Assessment: Normal  Communication   Communication: No difficulties  Cognition Arousal/Alertness: Awake/alert Behavior During Therapy: WFL for tasks assessed/performed Overall Cognitive Status: No family/caregiver present to determine baseline cognitive functioning                                 General Comments: A&Ox4, some slower processing and problem solving though functional - sleepy      General Comments General comments (skin integrity, edema, etc.): SpO2 96% on 1L ambulating, 88% on RA. otherwise VSS.    Exercises     Assessment/Plan    PT Assessment Patient needs continued PT services  PT Problem List Decreased strength;Decreased activity tolerance;Decreased balance;Decreased mobility;Decreased knowledge of use of DME;Cardiopulmonary status limiting activity       PT Treatment Interventions DME instruction;Gait training;Stair training;Functional mobility training;Therapeutic activities;Therapeutic exercise;Balance training;Patient/family education    PT Goals (Current goals can be found in the Care Plan section)  Acute Rehab PT Goals Patient Stated Goal: get some rest PT Goal Formulation: With patient Time For Goal Achievement: 12/12/20 Potential to Achieve Goals: Good    Frequency Min 3X/week   Barriers to discharge        Co-evaluation               AM-PAC PT "6 Clicks" Mobility  Outcome Measure Help needed turning from your back to your side while in a flat bed without using bedrails?: None Help needed moving from lying on your back to sitting on the side of a flat bed  without using bedrails?: None Help needed moving to and from a bed to a chair (including a wheelchair)?: A Little Help needed standing up from a chair using your arms (e.g., wheelchair or bedside chair)?: A Little Help needed to walk in hospital room?: A Little Help needed climbing 3-5 steps with a railing? : A Little 6 Click Score: 20    End of Session Equipment Utilized During Treatment: Gait belt;Oxygen Activity Tolerance: Patient tolerated treatment well Patient left: in chair;with call bell/phone within reach;with family/visitor present   PT Visit Diagnosis: Unsteadiness on feet (R26.81);Other abnormalities of gait and mobility (R26.89);Muscle weakness (generalized) (M62.81);Difficulty in walking, not elsewhere classified (R26.2)    Time: [id number] PT Time Calculation (min) (ACUTE ONLY): 39 min   Charges:   PT Evaluation $PT Eval Low Complexity: 1 Low PT Treatments $Gait Training: 8-22 mins $Therapeutic Activity: 8-22 mins        Ellouise Newer, PT, DPT  Ellouise Newer 11/28/2020, 12:46 PM

## 2020-11-28 NOTE — Consult Note (Signed)
Cardiology Consultation:   Patient ID: Molly [name] MRN: [id number]; DOB: [birth year]  Admit date: 11/26/2020 Date of Consult: 11/28/2020  PCP:  [name], MD   [facility] Providers Cardiologist:  Jenne Campus, MD   {   Patient Profile:   Molly [name] is a 70 y.o. female with a past medical history of PAD (s/p prior angiography showing bilateral SFA CTO's and renal artery stenosis), known coronary artery calcifications by prior CT (admitted in 08/2020 with chest pain but cath not pursued given her CKD and outpatient NST recommended but patient declined), HTN, HLD, Type 2 DM and Stage 3 CKD who is being seen today for the evaluation of HFpEF at the request of Dr. Karleen Hampshire.  History of Present Illness:   Molly [name] was recently evaluated by Dr. Agustin Cree on 11/26/2020 and reported having recently been evaluated by her Pulmonologist and taken off supplemental oxygen but she reported her O2 saturations were still dropping into the 80's at times. She did report intermittent chest pain but denied any association with exertion. Her O2 was at 74% during her visit and BP at 210/90, therefore she was sent to the Emergency Dept for further evaluation.   She presented to [facility] and initial labs showed WBC 5.2, Hgb 11.7, platelets 228, Na+ 139, K+ 4.3 and creatinine 2.77. BNP 1695. D-dimer 1.96. Hs Troponin 125 with repeat values not obtained. ABG showed pH 7.258, CO2 64.1 and pO2 119. CXR showed low lung volumes with basilar opacities, concerning for PNA. EKG showed sinus bradycardia, HR 52 with low-voltage throughout.   She was started on IV Lasix '40mg'$  BID for an acute CHF exacerbation and admitted to [facility] for further evaluation. Was started on Heparin given her elevated D-dimer and VQ Scan was ordered but not yet performed.  Attempted yesterday but unable to have performed secondary to orthopnea.  Nephrology was consulted to assist with diuresis. She did have a Renal  US which showed no acute findings. Repeat echo this admission shows a preserved EF of 60-65% with no regional WMA. Was noted to have mild LVH, Grade 2 DD, normal RV function, normal PASP, small pericardial effusion and mild aortic sclerosis without stenosis.   In talking with the patient and her husband today, she reports having worsening dyspnea on exertion along with orthopnea for the past several days leading up to admission. Denies any exertional chest pain but does report intermittent discomfort along her left pectoral region which occurs at rest and spontaneously resolves. Denies any specific abdominal distention or pitting edema. She had noticed approximately a 10 pound weight gain prior to admission but felt like this was possibly secondary to her dietary intake but says she was trying to limit her sodium. She was not on a regular fluid pill prior to admission.   Past Medical History:  Diagnosis Date   Abdominal aortic atherosclerosis with stenosis 06/29/2015   AKI (acute kidney injury) ([place]) 06/29/2015   Atherosclerosis of coronary artery without angina pectoris 04/21/2020   Atopic dermatitis 04/21/2020   Benign hypertension with CKD (chronic kidney disease) stage III ([place]) 04/21/2020   Bilateral impacted cerumen 08/21/2019   Chest pain 08/31/2020   Cholecystitis 06/29/2015   Cholelithiasis 06/29/2015   Claudication in peripheral vascular disease ([place]) 08/28/2019   Peripheral arterial disease   Colon cancer screening 04/21/2020   Dehydration with hyponatremia 06/29/2015   Diabetes mellitus ([place]) 07/15/2018   Diarrhea 04/21/2020   DKA (diabetic ketoacidoses) 06/29/2015   Dyslipidemia 08/30/2018  Elevated troponin    Epigastric pain 04/21/2020   Gastroesophageal reflux disease 04/21/2020   HTN (hypertension) 06/29/2015   Hyperglycemia due to type 2 diabetes mellitus ([place]) 04/21/2020   Hypertension    Hypertensive emergency 07/12/2020   Hypertensive heart disease without congestive heart failure 04/21/2020    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   Proptosis 04/21/2020   Proximal leg weakness 09/10/2018   Ptosis of left eyelid 09/10/2018   Renal artery stenosis ([place]) 12/17/2019   Renal artery stenosis   Standard chest x-ray abnormal 04/21/2020   Vitamin D deficiency 04/21/2020   Weight loss 04/21/2020    Past Surgical History:  Procedure Laterality Date   ABDOMINAL AORTOGRAM W/LOWER EXTREMITY N/A 08/28/2019   Procedure: ABDOMINAL AORTOGRAM W/LOWER EXTREMITY;  Surgeon: Lorretta Harp, MD;  Location: [facility];  Service: Cardiovascular;  Laterality: N/A;   CATARACT EXTRACTION, BILATERAL     CHOLECYSTECTOMY N/A 06/30/2015   Procedure: LAPAROSCOPIC CHOLECYSTECTOMY WITH INTRAOPERATIVE CHOLANGIOGRAM;  Surgeon: Donnie Mesa, MD;  Location: [facility];  Service: General;  Laterality: N/A;   PERIPHERAL VASCULAR BALLOON ANGIOPLASTY Right 08/28/2019   Procedure: PERIPHERAL VASCULAR BALLOON ANGIOPLASTY;  Surgeon: Lorretta Harp, MD;  Location: [facility];  Service: Cardiovascular;  Laterality: Right;  attempted SFA     Home Medications:  Prior to Admission medications   Medication Sig Start Date End Date Taking? Authorizing Provider  Carboxymethylcell-Hypromellose (GENTEAL OP) Place 1 drop into both eyes daily as needed (dry eyes).   Yes [provider]  carvedilol (COREG) 25 MG tablet Take 1 tablet (25 mg total) by mouth 2 (two) times daily with a  meal. 10/28/20 07/25/21 Yes Park Liter, MD  Cholecalciferol 25 MCG (1000 UT) tablet Take 2,000 Units by mouth daily.   Yes [provider]  cilostazol (PLETAL) 50 MG tablet Take 1 tablet (50 mg total) by mouth 2 (two) times daily. 03/15/20  Yes Lorretta Harp, MD  dicyclomine (BENTYL) 10 MG capsule Take 10 mg by mouth 2 (two) times daily.   Yes [provider]  diphenhydrAMINE (BENADRYL) 25 MG tablet Take 25 mg by mouth daily.   Yes [provider]  ezetimibe (ZETIA) 10 MG tablet Take 1 tablet (10 mg total) by mouth daily. 10/28/20 07/25/21 Yes Park Liter, MD  famotidine (PEPCID) 20 MG tablet Take 20 mg by mouth in the morning and at bedtime.   Yes [provider]  hydrALAZINE (APRESOLINE) 25 MG tablet Take 1 tablet (25 mg total) by mouth every 12 (twelve) hours. 10/28/20 07/25/21 Yes Park Liter, MD  insulin isophane & regular human (HUMULIN 70/30 MIX) (70-30) 100 UNIT/ML KwikPen Inject 16-20 Units into the skin daily. 06/26/18  Yes [provider]  ketoconazole (NIZORAL) 2 % cream Apply 1 application topically daily as needed for irritation.  05/08/19  Yes [provider]  levocetirizine (XYZAL) 5 MG tablet Take 5 mg by mouth daily as needed for allergies. 05/12/19  Yes [provider]  loperamide (IMODIUM A-D) 2 MG tablet Take 2 mg by mouth 4 (four) times daily as needed for diarrhea or loose stools.   Yes [provider]  Pediatric Multivitamins-Iron (FLINTSTONES COMPLETE) 18 MG CHEW Chew 1 tablet by mouth daily. Unknown strength   Yes [provider]  rosuvastatin (CRESTOR) 40 MG tablet TAKE 1 TABLET(40 MG) BY MOUTH AT BEDTIME Patient taking differently: Take 40 mg by mouth daily. 05/10/20  Yes Park Liter, MD  triamcinolone cream (KENALOG) 0.1 % Apply 1 application topically daily as needed (spots on legs).    Yes [provider]    Inpatient Medications: Scheduled Meds:   atorvastatin  40 mg Oral Daily   carvedilol  3.125 mg Oral BID WC   ezetimibe  10 mg Oral Daily   hydrALAZINE  50 mg Oral Q8H   sodium chloride flush  3 mL Intravenous Q12H   Continuous Infusions:  sodium chloride     heparin 1,000 Units/hr (11/27/20 2237)   PRN Meds: sodium chloride, acetaminophen **OR** acetaminophen, albuterol, diphenhydrAMINE, insulin aspart, senna-docusate, sodium chloride flush  Allergies:    Allergies  Allergen Reactions   Lisinopril Other (See Comments)   Mercury Nausea And Vomiting    Social History:   Social History   Socioeconomic History   Marital status: Married    Spouse name: Not on file   Number of children: Not on file   Years of education: Not on file   Highest education level: Not on file  Occupational History   Not on file  Tobacco Use   Smoking status: Never   Smokeless tobacco: Never  Vaping Use   Vaping Use: Never used  Substance and Sexual Activity   Alcohol use: No   Drug use: Never   Sexual activity: Not on file  Other Topics Concern   Not on file  Social History Narrative   Not on file   Social Determinants of Health   Financial Resource Strain: Not on file  Food Insecurity: Not on file  Transportation Needs: Not on file  Physical Activity: Not on file  Stress: Not on file  Social Connections: Not on file  Intimate Partner Violence: Not on file    Family History:    Family History  Problem Relation Age of Onset   Breast cancer Mother    Hypertension Mother    Hypertension Father    Pancreatic cancer Other        uncle     ROS:  Please see the history of present illness.   All other ROS reviewed and negative.     Physical Exam/Data:   Vitals:   11/28/20 0622 11/28/20 0731 11/28/20 0800 11/28/20 1000  BP: (!) 182/58 (!) 157/57 (!) 162/54 (!) 132/47  Pulse:  67 (!) 51 66  Resp:  '15 18 20  '$ Temp:      TempSrc:      SpO2:  95% 98% 98%  Weight:        Intake/Output Summary (Last 24 hours) at  11/28/2020 1105 Last data filed at 11/28/2020 0800 Gross per 24 hour  Intake 1074.43 ml  Output 3100 ml  Net -2025.57 ml   Last 3 Weights 11/28/2020 11/27/2020 11/26/2020  Weight (lbs) 157 lb 13.6 oz 162 lb 0.6 oz 169 lb  Weight (kg) 71.6 kg 73.5 kg 76.658 kg     Body mass index is 28.87 kg/m.  General:  Well nourished, well developed female appearing in no acute  distress.  HEENT: normal Lymph: no adenopathy Neck: no JVD Endocrine:  No thryomegaly Vascular: No carotid bruits; FA pulses 2+ bilaterally without bruits  Cardiac:  normal S1, S2; Regular rhythm, bradycardic rate. No murmur.  Lungs: Mild rales along bases bilaterally. Abd: soft, nontender, no hepatomegaly  Ext: no pitting edema Musculoskeletal:  No deformities, BUE and BLE strength normal and equal Skin: warm and dry  Neuro:  CNs 2-12 intact, no focal abnormalities noted Psych:  Normal affect   EKG:  The EKG was personally reviewed and demonstrates:Sinus bradycardia, HR 52 with low-voltage throughout.   Relevant CV Studies:  Echocardiogram: 11/27/2020 IMPRESSIONS     1. Left ventricular ejection fraction, by estimation, is 60 to 65%. The  left ventricle has normal function. The left ventricle has no regional  wall motion abnormalities. There is mild left ventricular hypertrophy.  Left ventricular diastolic parameters  are consistent with Grade II diastolic dysfunction (pseudonormalization).  Elevated left atrial pressure.   2. Right ventricular systolic function is normal. The right ventricular  size is normal. There is normal pulmonary artery systolic pressure.   3. A small pericardial effusion is present.   4. The mitral valve is normal in structure. No evidence of mitral valve  regurgitation. No evidence of mitral stenosis.   5. The aortic valve is tricuspid. Aortic valve regurgitation is not  visualized. Mild aortic valve sclerosis is present, with no evidence of  aortic valve stenosis.   6. The inferior  vena cava is normal in size with greater than 50%  respiratory variability, suggesting right atrial pressure of 3 mmHg.   Laboratory Data:  High Sensitivity Troponin:   Recent Labs  Lab 11/27/20 0450  TROPONINIHS 123*     Chemistry Recent Labs  Lab 11/26/20 1226 11/26/20 1303 11/26/20 1755 11/27/20 0450 11/28/20 0146  NA 139   < > 141 140 141  K 4.3   < > 4.3 4.3 3.8  CL 101  --   --  104 100  CO2 27  --   --  28 31  GLUCOSE 56*  --   --  80 113*  BUN 48*  --   --  43* 40*  CREATININE 2.77*  --   --  2.91* 2.85*  CALCIUM 8.7*  --   --  8.4* 8.4*  GFRNONAA 18*  --   --  17* 17*  ANIONGAP 11  --   --  8 10   < > = values in this interval not displayed.    Recent Labs  Lab 11/26/20 1226  PROT 7.6  ALBUMIN 4.1  AST 59*  ALT 80*  ALKPHOS 66  BILITOT 0.5   Hematology Recent Labs  Lab 11/26/20 1226 11/26/20 1303 11/26/20 1755 11/27/20 0450 11/28/20 0146  WBC 5.2  --   --  5.2 5.6  RBC 4.24  --   --  3.82* 4.06  HGB 11.7*   < > 11.9* 10.4* 11.2*  HCT 39.1   < > 35.0* 35.7* 37.5  MCV 92.2  --   --  93.5 92.4  MCH 27.6  --   --  27.2 27.6  MCHC 29.9*  --   --  29.1* 29.9*  RDW 15.4  --   --  15.4 15.2  PLT 228  --   --  202 200   < > = values in this interval not displayed.   BNP Recent Labs  Lab 11/26/20 1226  BNP 1,695.6*    DDimer  Recent Labs  Lab 11/26/20 1226  DDIMER 1.96*     Radiology/Studies:  US RENAL  Result Date: 21-Dec-2020 CLINICAL DATA:  Chronic renal failure EXAM: RENAL / URINARY TRACT ULTRASOUND COMPLETE COMPARISON:  09/04/2020 FINDINGS: Right Kidney: Renal measurements: 10.3 x 4.3 x 4.8 cm. = volume: 108 mL. Mild extrarenal pelvis is noted. No obstructive changes are seen. Left Kidney: Renal measurements: 8.7 x 4.3 x 4.3 cm. = volume: 83 mL. Echogenicity within normal limits. No mass or hydronephrosis visualized. Bladder: Appears normal for degree of bladder distention. Other: None. IMPRESSION: No acute abnormality noted.  Electronically Signed   By: Inez Catalina M.D.   On: 12-21-2020 22:13   DG Chest Port 1 View  Result Date: 11/26/2020 CLINICAL DATA:  SOB (shortness of breath) R06.02 (ICD-10-CM) EXAM: PORTABLE CHEST 1 VIEW COMPARISON:  October 08, 2020. FINDINGS: Low lung volumes with increased left greater than right basilar opacities. No visible pleural effusions or pneumothorax. Cardiomediastinal silhouette is mildly enlarged, likely accentuated by low lung volumes and AP technique. No acute osseous abnormality. IMPRESSION: Low lung volumes with increased left greater than right basilar opacities, suspicious for pneumonia. Electronically Signed   By: Margaretha Sheffield M.D.   On: 11/26/2020 13:42      Assessment and Plan:   1. Acute Hypoxic Respiratory Failure in the setting of HFrEF - Presented with worsening dyspnea and saturations were in the 70's on room air. She does report a 10+ pound weight gain at home prior to admission.  - BNP elevated to 1695 and D-dimer at 1.96. VQ scan is pending to rule out a PE and she has been continued on Heparin until this can be performed. - Her echocardiogram shows a preserved EF of 60 to 65% with no wall motion abnormalities. She does have normal RV function and normal PASP.  Would not anticipate further cardiac testing such as a catheterization given her high risk for contrast-induced nephropathy as discussed during prior admissions. - She has been receiving IV Lasix 40 mg twice daily with a recorded net output of -3.1 L thus far. Her initial weight was at 162 lbs and has declined to 157 lbs with a reported baseline of approximately 150 lbs. Will defer diuretic dosing to Nephrology but would anticipate continuing with IV diuresis until her orthopnea improves and weight is close to baseline. The prior order expired yesterday so will reorder. We reviewed the importance of following daily weights at home and limiting sodium and fluid intake.   2. PAD - Prior angiography showed  bilateral SFA CTO's. She was on Pletal prior to admission and would hold giving her CHF. Continue ASA and statin therapy.    3. CAD/ Elevated Troponin Values - She has known coronary artery calcifications by prior CT and was previously admitted earlier this year with chest pain and a cardiac catheterization was not pursued given her CKD and she did not wish to proceed with outpatient stress testing.   - Her initial Hs troponin was at 123 this admission with repeat values not pursued. Will add onto AM labs. If overall flat, suspect this is secondary to demand ischemia in the setting of her CHF exacerbation and CKD.  - Continue ASA, beta-blocker and statin therapy. She is on Heparin as outlined above until a PE can be ruled out.   4. Hypertensive Urgency - Her blood pressure was at 210/90 on the day of admission and has been variable from 138/47 - 206/67 within the past 24 hours, at 159/46 on  most recent check.  She was on Coreg 25 mg twice daily along with Hydralazine 25 mg twice daily as an outpatient but her Coreg this admission was only ordered as 3.25 mg twice daily due to bradycardia. Her heart rate has been in the 60's by review of telemetry, therefore will attempt to titrate to 6.25 mg twice daily. Hydralazine has been titrated to 50 mg 3 times daily and Losartan held.  5. Stage 3 CKD - Her creatinine was at 2.77 on admission which is similar to prior values earlier this year. Creatinine is overall stable at 2.85 today. Nephrology is following.     For questions or updates, please contact [place] Please consult [email or web address] for contact info under    Signed, Erma Heritage, PA-C  11/28/2020 11:05 AM

## 2020-11-28 NOTE — Progress Notes (Signed)
[facility] Progress Note  Subjective: very good UOP , 2.2 L yest and 2.0 so far today. BP's remain up a bit but improving. Improved [place] 4 > 3 > 2 L this am. Down 5 lbs. Creat down slightly to 2.85.   Vitals:   11/28/20 0500 11/28/20 0543 11/28/20 0622 11/28/20 0731  BP:  (!) 164/60 (!) 182/58 (!) 157/57  Pulse:  64  67  Resp:  15  15  Temp:  98.4 F (36.9 C)    TempSrc:  Oral    SpO2:  100%  95%  Weight: 71.6 kg       Exam:  alert, nad   no jvd  Chest cta bilat  Cor reg no RG  Abd soft ntnd no ascites   Ext no LE edema   Alert, NF, ox3    Home meds include coreg, pletal, pepcid, humulin 70/30 insulin, crestor, zetia, hydralazine      Date                          Creat               eGFR    2017                         1.92 >> 0.79    2019                         0.91- 0.99    2020                         1.05 Sep 2019                 1.08- 1.49        42- 62 ml/min, IIIb    Feb 2022                  1.33                 41 ml/min, stage IIIb    May 17- 30, 2022     1.38- 3.13        16- 42    Aug 12                      2.77                 18    Nov 27, 2020           2.91                 17     UNa 123  UCr 20    UA pend    Renal US - 10.3, 8.7 cm kidneys w/o hydro, echo wnl     CXR 11/26/20 - IMPRESSION: Low lung volumes with increased left greater than right basilar opacities, suspicious for pneumonia.     Assessment/ Plan: AKI on CKD IIIb - vs progression to CKD IV.  Baseline creatinine from 2021 was 1.1- 1.4, eGFR 41- 60 ml/min, but in 2022 creat has climbed to the high 2's now. Progressive renal failure which is likely due to HTN and cardiorenal syndrome. Urine lytes are c/w diuresis/ CKD. Imaging shows no obstruction, awaiting today's labs. She has a kidney doctor in [place]. Will need maintenance loop diuretic when ready  for dc.  Getting lasix IV 40 bid now and SOB is improving but still requiring O2 and +orthopnea. Cont lasix IV 40 bid.  Acute  resp failure - suspected due to a/c diast CHF exacerbation. Vasc congestion per CXR. Diuresing well. Cardiology assistance appreciated.  DM2 - on long-term insulin HTN - hold ARB for now, use other agents non-ACEi/ ARB while diuresing.          Molly [name] 11/28/2020, 8:01 AM   Recent Labs  Lab 11/26/20 1226 11/26/20 1303 11/26/20 1755 11/27/20 0450 11/28/20 0146  K 4.3   < > 4.3 4.3  --   BUN 48*  --   --  43*  --   CREATININE 2.77*  --   --  2.91*  --   CALCIUM 8.7*  --   --  8.4*  --   HGB 11.7*   < > 11.9* 10.4* 11.2*   < > = values in this interval not displayed.   Inpatient medications:  atorvastatin  40 mg Oral Daily   carvedilol  3.125 mg Oral BID WC   ezetimibe  10 mg Oral Daily   hydrALAZINE  50 mg Oral Q8H   sodium chloride flush  3 mL Intravenous Q12H    sodium chloride     heparin 1,000 Units/hr (11/27/20 2237)   sodium chloride, acetaminophen **OR** acetaminophen, albuterol, diphenhydrAMINE, insulin aspart, senna-docusate, sodium chloride flush

## 2020-11-28 NOTE — Progress Notes (Signed)
Patient declined BIPAP use at this time. Currently on 3L [place]. No distress noted. Equipment removed from pt. Room.

## 2020-11-28 NOTE — Progress Notes (Signed)
Pts CBG is 157. Pt refusing insulin at this time. Will continue to monitor.

## 2020-11-28 NOTE — Progress Notes (Signed)
ANTICOAGULATION CONSULT NOTE  Pharmacy Consult for heparin Indication:  rule out pulmonary embolus  Allergies  Allergen Reactions   Lisinopril Other (See Comments)   Mercury Nausea And Vomiting    Patient Measurements: Weight: 71.6 kg (157 lb 13.6 oz) Heparin Dosing Weight: 67kg  Vital Signs: Temp: 98.4 F (36.9 C) (08/14 0543) Temp Source: Oral (08/14 0543) BP: 157/57 (08/14 0731) Pulse Rate: 67 (08/14 0731)  Labs: Recent Labs    11/26/20 1226 11/26/20 1303 11/26/20 1755 11/27/20 0450 11/28/20 0146  HGB 11.7*   < > 11.9* 10.4* 11.2*  HCT 39.1   < > 35.0* 35.7* 37.5  PLT 228  --   --  202 200  HEPARINUNFRC  --   --   --  0.72* 0.52  CREATININE 2.77*  --   --  2.91*  --   TROPONINIHS  --   --   --  123*  --    < > = values in this interval not displayed.     Estimated Creatinine Clearance: 17.1 mL/min (A) (by C-G formula based on SCr of 2.91 mg/dL (H)).   Medical History: Past Medical History:  Diagnosis Date   Abdominal aortic atherosclerosis with stenosis 06/29/2015   AKI (acute kidney injury) ([place]) 06/29/2015   Atherosclerosis of coronary artery without angina pectoris 04/21/2020   Atopic dermatitis 04/21/2020   Benign hypertension with CKD (chronic kidney disease) stage III ([place]) 04/21/2020   Bilateral impacted cerumen 08/21/2019   Chest pain 08/31/2020   Cholecystitis 06/29/2015   Cholelithiasis 06/29/2015   Claudication in peripheral vascular disease ([place]) 08/28/2019   Peripheral arterial disease   Colon cancer screening 04/21/2020   Dehydration with hyponatremia 06/29/2015   Diabetes mellitus (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   Proptosis 04/21/2020   Proximal leg weakness 09/10/2018   Ptosis of left eyelid 09/10/2018   Renal artery stenosis (HCC) 12/17/2019   Renal artery stenosis   Standard chest x-ray abnormal 04/21/2020   Vitamin D deficiency 04/21/2020   Weight loss 04/21/2020    Medications:  Medications Prior to Admission  Medication Sig Dispense Refill Last Dose   Carboxymethylcell-Hypromellose (GENTEAL OP) Place 1 drop into both eyes daily as needed (dry eyes).   Past Week   carvedilol (COREG) 25 MG tablet Take 1 tablet (25 mg total) by mouth 2 (two) times daily with a meal. 180 tablet 2 11/26/2020 at 10.30 am   Cholecalciferol 25 MCG (1000 UT) tablet Take 2,000 Units by mouth daily.   11/26/2020   cilostazol (PLETAL) 50 MG tablet Take 1 tablet (50  mg total) by mouth 2 (two) times daily. 180 tablet 3 11/26/2020   dicyclomine (BENTYL) 10 MG capsule Take 10 mg by mouth 2 (two) times daily.   11/26/2020   diphenhydrAMINE (BENADRYL) 25 MG tablet Take 25 mg by mouth daily.   11/26/2020   ezetimibe (ZETIA) 10 MG tablet Take 1 tablet (10 mg total) by mouth daily. 90 tablet 2 11/26/2020   famotidine (PEPCID) 20 MG tablet Take 20 mg by mouth in the morning and at bedtime.   11/26/2020   hydrALAZINE (APRESOLINE) 25 MG tablet Take 1 tablet (25 mg total) by mouth every 12 (twelve) hours. 180 tablet 2 11/26/2020    insulin isophane & regular human (HUMULIN 70/30 MIX) (70-30) 100 UNIT/ML KwikPen Inject 16-20 Units into the skin daily.   11/25/2020   ketoconazole (NIZORAL) 2 % cream Apply 1 application topically daily as needed for irritation.    11/25/2020   levocetirizine (XYZAL) 5 MG tablet Take 5 mg by mouth daily as needed for allergies.   11/25/2020   loperamide (IMODIUM A-D) 2 MG tablet Take 2 mg by mouth 4 (four) times daily as needed for diarrhea or loose stools.   Past Week   Pediatric Multivitamins-Iron (FLINTSTONES COMPLETE) 18 MG CHEW Chew 1 tablet by mouth daily. Unknown strength   11/26/2020   rosuvastatin (CRESTOR) 40 MG tablet TAKE 1 TABLET(40 MG) BY MOUTH AT BEDTIME (Patient taking differently: Take 40 mg by mouth daily.) 90 tablet 1 11/25/2020   triamcinolone cream (KENALOG) 0.1 % Apply 1 application topically daily as needed (spots on legs).    11/25/2020    Assessment: 70 year old female admitted from [facility] for shortness of breath and confusion. Unable to do CTA to rule out PE due to renal function, VQ scan ordered, will start heparin now.   Heparin level therapeutic at 0.52, CBC stable, VQ scan still pending.  Goal of Therapy:  Heparin level 0.3-0.7 units/ml Monitor platelets by anticoagulation protocol: Yes   Plan:  Continue heparin 1000 units/h Daily heparin level and CBC F/U VQ scan results  Arrie Senate, PharmD, BCPS, [facility] Clinical Pharmacist [phone] Please check AMION for all [place] numbers 11/28/2020

## 2020-11-28 NOTE — Progress Notes (Signed)
PROGRESS NOTE    Molly [name]  [id number] DOB: [birth year] DOA: 11/26/2020 PCP: [name], MD    Chief Complaint  Patient presents with   Shortness of Breath    Brief Narrative:  Molly [name] is a 70 y.o. female with medical history significant for DMT2, HFpEF, HTN, HLD, PVD, CKD 4 who was transferred to [facility] from [facility] ER due to CHF exacerbation and respiratory failure with hypercapnia and hypoxia. Ms. Hunsley went to her scheduled appointment at her doctors office today and was noted to have an O2 saturation of 78% on RA. She reports she has redness of breath has been worse with exertion over the last 2 to 3 weeks.   In the emergency room patient was found to be hypoxic on room air in the low 80s.  She was found to have respiratory acidosis as well as hypercapnia and hypoxia on ABG.  She was started on BiPAP therapy which she is tolerating well.  She is found to have a elevated BNP.  Patient was transferred to [facility] [place] for admission. She was also found to have elevated d dimer and hypoglycemia.  She is being treated for acute on chronic diastolic heart failure with IV lasix and BIPAP.    Assessment & Plan:   Principal Problem:   Acute on chronic heart failure with preserved ejection fraction (HFpEF) (HCC) Active Problems:   HTN (hypertension)   Type 2 diabetes mellitus with stage 4 chronic kidney disease, with long-term current use of insulin (HCC)   Acute on chronic respiratory failure with hypoxia and hypercapnia (HCC)   Stage 4 chronic kidney disease (HCC)   Elevated d-dimer   Prolonged QT interval   Acute respiratory failure with hypoxia and hypercarbia probably secondary to acute on chronic diastolic heart failure Briefly required BiPAP, currently on 2 L of nasal cannula oxygen. Recommend continue BiPAP tonight and repeat ABG in the morning. Continue with IV Lasix 40 mg twice daily. Diuresed about 2700 ml in the last 24 hours and net negative  3.1 lit since admission.  Creatinine around 2.85. cardiology consulted for recommendations.  Nephrology consulted for further recommendations. Continue with strict intake and output and daily weights Echocardiogram showed LVef of 60 to 65%, no regional wall motion abnormalities, mild left ventricular hypertrophy and grade 2 diastolic dysfunction.  Elevated d dimer on admission, V/Q scan pending to evaluate for DVT.    Stage IV CKD Baseline creatinine around 2.4, admitted with a creatinine of 2.7 , peaked to 2.9 and stabilized to 2.8.  US renal negative for hydronephrosis.  Nephrology consulted for recommendations.     Essential hypertension Blood pressure parameters are better controlled.  Restarted the patient's Coreg at a lower dose and increase hydralazine to 50 mg  3 times daily.    Type 2 diabetes mellitus with stage IV CKD HemoGlobin A1c around 8.2 CBG (last 3)  Recent Labs    11/28/20 0102 11/28/20 0545 11/28/20 1111  GLUCAP 118* 104* 121*    Resume SSI.  Non insulin dependent.    Elevated d dimer and dyspnea:  VQ scan ordered for evaluation of PE and on IV heparin empirically.    Anemia of chronic disease Normocytic anemia hemoglobin stable around 10   Acute metabolic encephalopathy  Secondary to hypercarbic respiratory failure and respiratory acidosis.    DVT prophylaxis: (heparin)  Code Status: (full code.  Family Communication: None at bedside.  Disposition:   Status is: Inpatient  Remains inpatient appropriate because:Ongoing  diagnostic testing needed not appropriate for outpatient work up and IV treatments appropriate due to intensity of illness or inability to take PO  Dispo: The patient is from: Home              Anticipated d/c is to: Home              Patient currently is not medically stable to d/c.   Difficult to place patient No       Consultants:  Nephrology.   Procedures: echocardiogram.   Antimicrobials: none.     Subjective: Still sleepy this am.  She reports she is unable to lay flat.   Objective: Vitals:   11/28/20 0731 11/28/20 0800 11/28/20 1000 11/28/20 1113  BP: (!) 157/57 (!) 162/54 (!) 132/47 (!) 159/46  Pulse: 67 (!) 51 66 (!) 54  Resp: '15 18 20 16  '$ Temp:    98.7 F (37.1 C)  TempSrc:    Oral  SpO2: 95% 98% 98% 100%  Weight:        Intake/Output Summary (Last 24 hours) at 11/28/2020 1426 Last data filed at 11/28/2020 0800 Gross per 24 hour  Intake 874.43 ml  Output 2700 ml  Net -1825.57 ml    Filed Weights   11/27/20 0426 11/28/20 0500  Weight: 73.5 kg 71.6 kg    Examination:  General exam: appears comfortable on 2 lit of [place] oxygen.  Respiratory system: diminished air entry at bases, no wheezing heard. On 2 lit of [place] oxygen.  Cardiovascular system: S1 & S2 heard, RRR, no JVD, no pedal edema.  Gastrointestinal system: Abdomen is soft, non tender non distended, bowel sounds wnl.  Central nervous system: Answering all questions appropriately, walking with PT.  Extremities: no cyanosis.  Skin: No rashes seen.  Psychiatry:  Mood  is appropriate.     Data Reviewed: I have personally reviewed following labs and imaging studies  CBC: Recent Labs  Lab 11/26/20 1226 11/26/20 1303 11/26/20 1755 11/27/20 0450 11/28/20 0146  WBC 5.2  --   --  5.2 5.6  NEUTROABS 3.2  --   --   --   --   HGB 11.7* 12.2 11.9* 10.4* 11.2*  HCT 39.1 36.0 35.0* 35.7* 37.5  MCV 92.2  --   --  93.5 92.4  PLT 228  --   --  202 200     Basic Metabolic Panel: Recent Labs  Lab 11/26/20 1226 11/26/20 1303 11/26/20 1755 11/27/20 0450 11/28/20 0146  NA 139 141 141 140 141  K 4.3 4.2 4.3 4.3 3.8  CL 101  --   --  104 100  CO2 27  --   --  28 31  GLUCOSE 56*  --   --  80 113*  BUN 48*  --   --  43* 40*  CREATININE 2.77*  --   --  2.91* 2.85*  CALCIUM 8.7*  --   --  8.4* 8.4*     GFR: Estimated Creatinine Clearance: 17.5 mL/min (A) (by C-G formula based on SCr of 2.85 mg/dL  (H)).  Liver Function Tests: Recent Labs  Lab 11/26/20 1226  AST 59*  ALT 80*  ALKPHOS 66  BILITOT 0.5  PROT 7.6  ALBUMIN 4.1     CBG: Recent Labs  Lab 11/27/20 1116 11/27/20 1604 11/28/20 0102 11/28/20 0545 11/28/20 1111  GLUCAP 76 71 118* 104* 121*      Recent Results (from the past 240 hour(s))  Resp Panel by RT-PCR (Flu A&B,  Covid) Nasopharyngeal Swab     Status: None   Collection Time: 11/26/20 12:26 PM   Specimen: Nasopharyngeal Swab; Nasopharyngeal(NP) swabs in vial transport medium  Result Value Ref Range Status   SARS Coronavirus 2 by RT PCR NEGATIVE NEGATIVE Final    Comment: (NOTE) SARS-CoV-2 target nucleic acids are NOT DETECTED.  The SARS-CoV-2 RNA is generally detectable in upper respiratory specimens during the acute phase of infection. The lowest concentration of SARS-CoV-2 viral copies this assay can detect is 138 copies/mL. A negative result does not preclude SARS-Cov-2 infection and should not be used as the sole basis for treatment or other patient management decisions. A negative result may occur with  improper specimen collection/handling, submission of specimen other than nasopharyngeal swab, presence of viral mutation(s) within the areas targeted by this assay, and inadequate number of viral copies(<138 copies/mL). A negative result must be combined with clinical observations, patient history, and epidemiological information. The expected result is Negative.  Fact Sheet for Patients:  EntrepreneurPulse.com.au  Fact Sheet for Healthcare Providers:  IncredibleEmployment.be  This test is no t yet approved or cleared by the [place] FDA and  has been authorized for detection and/or diagnosis of SARS-CoV-2 by FDA under an Emergency Use Authorization (EUA). This EUA will remain  in effect (meaning this test can be used) for the duration of the COVID-19 declaration under Section 564(b)(1) of the Act,  21 U.S.C.section 360bbb-3(b)(1), unless the authorization is terminated  or revoked sooner.       Influenza A by PCR NEGATIVE NEGATIVE Final   Influenza B by PCR NEGATIVE NEGATIVE Final    Comment: (NOTE) The Xpert Xpress SARS-CoV-2/FLU/RSV plus assay is intended as an aid in the diagnosis of influenza from Nasopharyngeal swab specimens and should not be used as a sole basis for treatment. Nasal washings and aspirates are unacceptable for Xpert Xpress SARS-CoV-2/FLU/RSV testing.  Fact Sheet for Patients: EntrepreneurPulse.com.au  Fact Sheet for Healthcare Providers: IncredibleEmployment.be  This test is not yet approved or cleared by the [place] FDA and has been authorized for detection and/or diagnosis of SARS-CoV-2 by FDA under an Emergency Use Authorization (EUA). This EUA will remain in effect (meaning this test can be used) for the duration of the COVID-19 declaration under Section 564(b)(1) of the Act, 21 U.S.C. section 360bbb-3(b)(1), unless the authorization is terminated or revoked.  Performed at [facility], [address] [place], [place] [address]   MRSA Next Gen by PCR, Nasal     Status: None   Collection Time: 11/26/20 11:14 PM   Specimen: Nasal Mucosa; Nasal Swab  Result Value Ref Range Status   MRSA by PCR Next Gen NOT DETECTED NOT DETECTED Final    Comment: (NOTE) The GeneXpert MRSA Assay (FDA approved for NASAL specimens only), is one component of a comprehensive MRSA colonization surveillance program. It is not intended to diagnose MRSA infection nor to guide or monitor treatment for MRSA infections. Test performance is not FDA approved in patients less than 37 years old. Performed at [facility], [place] [address] [place], [place] [address]           Radiology Studies: US RENAL  Result Date: 11/27/2020 CLINICAL DATA:  Chronic renal failure EXAM: RENAL / URINARY TRACT ULTRASOUND  COMPLETE COMPARISON:  09/04/2020 FINDINGS: Right Kidney: Renal measurements: 10.3 x 4.3 x 4.8 cm. = volume: 108 mL. Mild extrarenal pelvis is noted. No obstructive changes are seen. Left Kidney: Renal measurements: 8.7 x 4.3 x  4.3 cm. = volume: 83 mL. Echogenicity within normal limits. No mass or hydronephrosis visualized. Bladder: Appears normal for degree of bladder distention. Other: None. IMPRESSION: No acute abnormality noted. Electronically Signed   By: Inez Catalina M.D.   On: 11/27/2020 22:13   ECHOCARDIOGRAM COMPLETE  Result Date: 11/27/2020    ECHOCARDIOGRAM REPORT   Patient Name:   ADAYSHA TREVOR Date of Exam: 11/27/2020 Medical Rec #:  [id number]    Height:       62.0 in Accession #:    [id number]   Weight:       162.0 lb Date of Birth:  Sep 20, 1950    BSA:          1.748 m Patient Age:    64 years     BP:           148/78 mmHg Patient Gender: F            HR:           51 bpm. Exam Location:  Inpatient Procedure: 2D Echo, Cardiac Doppler and Color Doppler Indications:    CHF  History:        Patient has prior history of Echocardiogram examinations, most                 recent 08/31/2020. CHF, PVD, Signs/Symptoms:Shortness of Breath                 and Resp. failure, CKD; Risk Factors:Diabetes, Hypertension,                 Dyslipidemia and Obesity.  Sonographer:    Dustin Flock RDCS Referring Phys: [id number] [place]  1. Left ventricular ejection fraction, by estimation, is 60 to 65%. The left ventricle has normal function. The left ventricle has no regional wall motion abnormalities. There is mild left ventricular hypertrophy. Left ventricular diastolic parameters are consistent with Grade II diastolic dysfunction (pseudonormalization). Elevated left atrial pressure.  2. Right ventricular systolic function is normal. The right ventricular size is normal. There is normal pulmonary artery systolic pressure.  3. A small pericardial effusion is present.  4. The mitral valve is normal  in structure. No evidence of mitral valve regurgitation. No evidence of mitral stenosis.  5. The aortic valve is tricuspid. Aortic valve regurgitation is not visualized. Mild aortic valve sclerosis is present, with no evidence of aortic valve stenosis.  6. The inferior vena cava is normal in size with greater than 50% respiratory variability, suggesting right atrial pressure of 3 mmHg. FINDINGS  Left Ventricle: Left ventricular ejection fraction, by estimation, is 60 to 65%. The left ventricle has normal function. The left ventricle has no regional wall motion abnormalities. The left ventricular internal cavity size was normal in size. There is  mild left ventricular hypertrophy. Left ventricular diastolic parameters are consistent with Grade II diastolic dysfunction (pseudonormalization). Elevated left atrial pressure. Right Ventricle: The right ventricular size is normal. Right ventricular systolic function is normal. There is normal pulmonary artery systolic pressure. The tricuspid regurgitant velocity is 2.70 m/s, and with an assumed right atrial pressure of 3 mmHg,  the estimated right ventricular systolic pressure is [id number] mmHg. Left Atrium: Left atrial size was normal in size. Right Atrium: Right atrial size was normal in size. Pericardium: A small pericardial effusion is present. Mitral Valve: The mitral valve is normal in structure. Mild mitral annular calcification. No evidence of mitral valve regurgitation. No evidence of mitral valve stenosis. Tricuspid Valve: The  tricuspid valve is normal in structure. Tricuspid valve regurgitation is mild . No evidence of tricuspid stenosis. Aortic Valve: The aortic valve is tricuspid. Aortic valve regurgitation is not visualized. Mild aortic valve sclerosis is present, with no evidence of aortic valve stenosis. Pulmonic Valve: The pulmonic valve was not well visualized. Pulmonic valve regurgitation is trivial. No evidence of pulmonic stenosis. Aorta: The aortic root is  normal in size and structure. Venous: The inferior vena cava is normal in size with greater than 50% respiratory variability, suggesting right atrial pressure of 3 mmHg. IAS/Shunts: No atrial level shunt detected by color flow Doppler.  LEFT VENTRICLE PLAX 2D LVIDd:         3.70 cm  Diastology LVIDs:         2.70 cm  LV e' medial:    3.92 cm/s LV PW:         1.20 cm  LV E/e' medial:  23.8 LV IVS:        1.40 cm  LV e' lateral:   3.81 cm/s LVOT diam:     1.90 cm  LV E/e' lateral: 24.5 LV SV:         60 LV SV Index:   34 LVOT Area:     2.84 cm  RIGHT VENTRICLE RV Basal diam:  2.90 cm RV S prime:     7.72 cm/s TAPSE (M-mode): 1.8 cm LEFT ATRIUM           Index       RIGHT ATRIUM           Index LA diam:      4.30 cm 2.46 cm/m  RA Area:     15.90 cm LA Vol (A2C): 29.9 ml 17.10 ml/m RA Volume:   38.30 ml  21.91 ml/m LA Vol (A4C): 57.0 ml 32.61 ml/m  AORTIC VALVE LVOT Vmax:   84.90 cm/s LVOT Vmean:  53.800 cm/s LVOT VTI:    0.211 m  AORTA Ao Root diam: 3.10 cm MITRAL VALVE               TRICUSPID VALVE MV Area (PHT): 2.72 cm    TR Peak grad:   29.2 mmHg MV Decel Time: 279 msec    TR Vmax:        270.00 cm/s MV E velocity: 93.40 cm/s MV A velocity: 92.10 cm/s  SHUNTS MV E/A ratio:  1.01        Systemic VTI:  0.21 m                            Systemic Diam: 1.90 cm Kirk Ruths MD Electronically signed by Kirk Ruths MD Signature Date/Time: 11/27/2020/1:14:38 PM    Final         Scheduled Meds:  Derrill Memo ON 11/29/2020] aspirin EC  81 mg Oral Daily   atorvastatin  40 mg Oral Daily   carvedilol  6.25 mg Oral BID WC   ezetimibe  10 mg Oral Daily   furosemide  40 mg Intravenous BID   hydrALAZINE  50 mg Oral Q8H   sodium chloride flush  3 mL Intravenous Q12H   Continuous Infusions:  sodium chloride     heparin 1,000 Units/hr (11/27/20 2237)     LOS: 2 days        Hosie Poisson, MD [facility]   To contact the attending provider between 7A-7P or the covering provider during after hours  7P-7A, please log into  the web site [email or web address] and access using universal [place] password for that web site. If you do not have the password, please call the hospital operator.  11/28/2020, 2:26 PM

## 2020-11-28 NOTE — Evaluation (Signed)
Occupational Therapy Evaluation Patient Details Name: Molly [name] MRN: [id number] DOB: Sep 25, 1950 Today's Date: 11/28/2020    History of Present Illness Molly [name] is a 70 y.o. female who was transferred to [facility] from [facility] due to CHF exacerbation and respiratory failure with hypercapnia and hypoxia. [name]. PMH: DMT2, HFpEF, HTN, HLD, PVD, CKD 4   Clinical Impression   PTA, pt lives with spouse and reports typically Independent with ADLs and mobility without AD though has endorsed recent falls since previous admission. Pt presents now with deficits in strength, dynamic standing balance and cardiopulmonary tolerance. Pt overall Min A for simple transfers without AD (assist needed for steadying), Setup for UB ADLs and Min A for LB ADLs. Began education on energy conservation strategies to improve performance with daily tasks, including encouragement to use shower chair at home. Rec HHOT follow-up at this time.    SpO2 92% on 2 L O2 with activity, 100% at rest HR WFL BP 157/57   Follow Up Recommendations  Home health OT;Supervision - Intermittent    Equipment Recommendations  None recommended by OT    Recommendations for Other Services       Precautions / Restrictions Precautions Precautions: Fall;Other (comment) Precaution Comments: monitor O2 (had been wearing 2 L O2 since DC in May 2022) Restrictions Weight Bearing Restrictions: No      Mobility Bed Mobility Overal bed mobility: Needs Assistance Bed Mobility: Supine to Sit;Sit to Supine     Supine to sit: Min assist;HOB elevated Sit to supine: Min guard   General bed mobility comments: Min A via handheld assist to lift trunk and pull hips forward. min guard to get B LE back into bed    Transfers Overall transfer level: Needs assistance Equipment used: 1 person hand held assist Transfers: Sit to/from [facility] Sit to Stand: Min guard Stand  pivot transfers: Min assist       General transfer comment: Min A for steadying assist in pivot to/from [facility]. LOB without UE support    Balance Overall balance assessment: Needs assistance;History of Falls Sitting-balance support: No upper extremity supported;Feet supported Sitting balance-Leahy Scale: Fair     Standing balance support: Single extremity supported;During functional activity Standing balance-Leahy Scale: Poor Standing balance comment: reliant on at least one UE support in dynamic tasks                           ADL either performed or assessed with clinical judgement   ADL Overall ADL's : Needs assistance/impaired Eating/Feeding: Independent   Grooming: Set up;Sitting   Upper Body Bathing: Set up;Sitting   Lower Body Bathing: Minimal assistance;Sit to/from stand   Upper Body Dressing : Set up;Sitting   Lower Body Dressing: Minimal assistance;Sit to/from stand   Toilet Transfer: Minimal Scientist, forensic Details (indicate cue type and reason): without AD, with handheld assist, steadying assist needed Toileting- Clothing Manipulation and Hygiene: Sit to/from stand;Minimal assistance Toileting - Clothing Manipulation Details (indicate cue type and reason): for clothing mgmt, able to perform hygiene with lateral leans       General ADL Comments: Pt noted with decreased cardiopulmonary tolerance and dynamic standing balance increasing fall risk for ADLs/mobility     Vision Baseline Vision/History: Wears glasses Wears Glasses: Reading only Patient Visual Report: No change from baseline Vision Assessment?: No apparent visual deficits     Perception     Praxis  Pertinent Vitals/Pain Pain Assessment: No/denies pain     Hand Dominance Right   Extremity/Trunk Assessment Upper Extremity Assessment Upper Extremity Assessment: Generalized weakness   Lower Extremity Assessment Lower Extremity Assessment: Defer to PT  evaluation   Cervical / Trunk Assessment Cervical / Trunk Assessment: Normal   Communication Communication Communication: No difficulties   Cognition Arousal/Alertness: Awake/alert Behavior During Therapy: WFL for tasks assessed/performed Overall Cognitive Status: No family/caregiver present to determine baseline cognitive functioning                                 General Comments: A&Ox4, some slower processing and problem solving though functional   General Comments  BP 157/57, higher overnight per RN. SpO2 92% on 2 L O2 with activity, 100% at rest    Exercises     Shoulder Instructions      Home Living Family/patient expects to be discharged to:: Private residence Living Arrangements: Spouse/significant other Available Help at Discharge: Family;Available 24 hours/day Type of Home: House Home Access: Stairs to enter [employer] of Steps: 2 Entrance Stairs-Rails: Right;Left Home Layout: One level     Bathroom Shower/Tub: Teacher, early years/pre: Standard     Home Equipment: Bedside commode;Shower seat;Grab bars - tub/shower;Cane - single point          Prior Functioning/Environment Level of Independence: Needs assistance  Gait / Transfers Assistance Needed: reports ambulatory without AD though will occasionally  hold to husband's arm out in community ADL's / Homemaking Assistance Needed: Reports able to complete basic ADLs but husband nearby to assist as needed. Husband has been completing IADLs, driving and grocery shopping. 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, tutors adults in free time   Comments: Endorses "a couple" of falls since discharge in May        OT Problem List: Decreased strength;Decreased activity tolerance;Impaired balance (sitting and/or standing);Cardiopulmonary status limiting activity      OT Treatment/Interventions: Self-care/ADL training;Therapeutic exercise;Energy conservation;DME and/or AE instruction;Therapeutic  activities;Patient/family education;Balance training    OT Goals(Current goals can be found in the care plan section) Acute Rehab OT Goals Patient Stated Goal: get some rest OT Goal Formulation: With patient Time For Goal Achievement: 12/12/20 Potential to Achieve Goals: Good  OT Frequency: Min 2X/week   Barriers to D/C:            Co-evaluation              AM-PAC OT "6 Clicks" Daily Activity     Outcome Measure Help from another person eating meals?: None Help from another person taking care of personal grooming?: A Little Help from another person toileting, which includes using toliet, bedpan, or urinal?: A Little Help from another person bathing (including washing, rinsing, drying)?: A Little Help from another person to put on and taking off regular upper body clothing?: A Little Help from another person to put on and taking off regular lower body clothing?: A Little 6 Click Score: 19   End of Session Equipment Utilized During Treatment: Oxygen Nurse Communication: Other (comment);Mobility status (O2, urination on BSC)  Activity Tolerance: Patient tolerated treatment well Patient left: in bed;with call bell/phone within reach;with bed alarm set  OT Visit Diagnosis: Unsteadiness on feet (R26.81);Other abnormalities of gait and mobility (R26.89);Muscle weakness (generalized) (M62.81)                Time: [id number] OT Time Calculation (min): 34 min Charges:  OT General Charges $  OT Visit: 1 Visit OT Evaluation $OT Eval Moderate Complexity: 1 Mod OT Treatments $Self Care/Home Management : 8-22 mins  [name], OTR/L Acute Rehab Services Office: [phone]   [name] 11/28/2020, 7:53 AM

## 2020-11-29 DIAGNOSIS — J9601 Acute respiratory failure with hypoxia: Secondary | ICD-10-CM

## 2020-11-29 DIAGNOSIS — E7800 Pure hypercholesterolemia, unspecified: Secondary | ICD-10-CM

## 2020-11-29 DIAGNOSIS — I739 Peripheral vascular disease, unspecified: Secondary | ICD-10-CM

## 2020-11-29 DIAGNOSIS — I10 Essential (primary) hypertension: Secondary | ICD-10-CM | POA: Diagnosis not present

## 2020-11-29 DIAGNOSIS — I5033 Acute on chronic diastolic (congestive) heart failure: Secondary | ICD-10-CM | POA: Diagnosis not present

## 2020-11-29 DIAGNOSIS — N184 Chronic kidney disease, stage 4 (severe): Secondary | ICD-10-CM | POA: Diagnosis not present

## 2020-11-29 LAB — C-REACTIVE PROTEIN: CRP: 0.6 mg/dL (ref <1.0)

## 2020-11-29 LAB — GLUCOSE, CAPILLARY
Glucose-Capillary: 164 mg/dL — ABNORMAL HIGH (ref 70–99)
Glucose-Capillary: 123 mg/dL — ABNORMAL HIGH (ref 70–99)
Glucose-Capillary: 122 mg/dL — ABNORMAL HIGH (ref 70–99)
Glucose-Capillary: 106 mg/dL — ABNORMAL HIGH (ref 70–99)

## 2020-11-29 LAB — COMPREHENSIVE METABOLIC PANEL
ALT: 46 U/L — ABNORMAL HIGH (ref 0–44)
AST: 41 U/L (ref 15–41)
Albumin: 3 g/dL — ABNORMAL LOW (ref 3.5–5.0)
Alkaline Phosphatase: 55 U/L (ref 38–126)
Anion gap: 11 (ref 5–15)
BUN: 35 mg/dL — ABNORMAL HIGH (ref 8–23)
CO2: 37 mmol/L — ABNORMAL HIGH (ref 22–32)
Calcium: 8 mg/dL — ABNORMAL LOW (ref 8.9–10.3)
Chloride: 93 mmol/L — ABNORMAL LOW (ref 98–111)
Creatinine, Ser: 2.73 mg/dL — ABNORMAL HIGH (ref 0.44–1.00)
GFR, Estimated: 18 mL/min — ABNORMAL LOW (ref >60)
Glucose, Bld: 132 mg/dL — ABNORMAL HIGH (ref 70–99)
Potassium: 3.3 mmol/L — ABNORMAL LOW (ref 3.5–5.1)
Sodium: 141 mmol/L (ref 135–145)
Total Bilirubin: 1.2 mg/dL (ref 0.3–1.2)
Total Protein: 6.1 g/dL — ABNORMAL LOW (ref 6.5–8.1)

## 2020-11-29 LAB — CBC
HCT: 35.2 % — ABNORMAL LOW (ref 36.0–46.0)
Hemoglobin: 10.6 g/dL — ABNORMAL LOW (ref 12.0–15.0)
MCH: 27.5 pg (ref 26.0–34.0)
MCHC: 30.1 g/dL (ref 30.0–36.0)
MCV: 91.4 fL (ref 80.0–100.0)
Platelets: 200 10*3/uL (ref 150–400)
RBC: 3.85 MIL/uL — ABNORMAL LOW (ref 3.87–5.11)
RDW: 15.2 % (ref 11.5–15.5)
WBC: 5 10*3/uL (ref 4.0–10.5)
nRBC: 0 % (ref 0.0–0.2)

## 2020-11-29 LAB — AMMONIA: Ammonia: 19 umol/L (ref 9–35)

## 2020-11-29 LAB — TSH: TSH: 2.532 u[IU]/mL (ref 0.350–4.500)

## 2020-11-29 LAB — T4, FREE: Free T4: 1.17 ng/dL — ABNORMAL HIGH (ref 0.61–1.12)

## 2020-11-29 LAB — HEPARIN LEVEL (UNFRACTIONATED): Heparin Unfractionated: 0.41 IU/mL (ref 0.30–0.70)

## 2020-11-29 LAB — MAGNESIUM: Magnesium: 2 mg/dL (ref 1.7–2.4)

## 2020-11-29 LAB — VITAMIN B12: Vitamin B-12: 1353 pg/mL — ABNORMAL HIGH (ref 180–914)

## 2020-11-29 LAB — TROPONIN I (HIGH SENSITIVITY): Troponin I (High Sensitivity): 162 ng/L (ref <18)

## 2020-11-29 MED ORDER — AMLODIPINE BESYLATE 10 MG PO TABS
10.0000 mg | ORAL_TABLET | Freq: Every day | ORAL | Status: DC
  Administered 2020-11-29 – 2020-12-03 (×5): 10 mg via ORAL
  Filled 2020-11-29 (×5): qty 1

## 2020-11-29 MED ORDER — INSULIN ASPART 100 UNIT/ML IJ SOLN
0.0000 [IU] | Freq: Three times a day (TID) | INTRAMUSCULAR | Status: DC
  Administered 2020-11-29: 2 [IU] via SUBCUTANEOUS
  Administered 2020-11-30: 5 [IU] via SUBCUTANEOUS
  Administered 2020-11-30: 3 [IU] via SUBCUTANEOUS
  Administered 2020-12-01: 2 [IU] via SUBCUTANEOUS
  Administered 2020-12-01: 3 [IU] via SUBCUTANEOUS
  Administered 2020-12-01 – 2020-12-03 (×4): 2 [IU] via SUBCUTANEOUS

## 2020-11-29 MED ORDER — FUROSEMIDE 40 MG PO TABS
40.0000 mg | ORAL_TABLET | Freq: Two times a day (BID) | ORAL | Status: DC
  Administered 2020-11-29 – 2020-11-30 (×4): 40 mg via ORAL
  Filled 2020-11-29 (×4): qty 1

## 2020-11-29 MED ORDER — INSULIN ASPART 100 UNIT/ML IJ SOLN: 0.0000 [IU] | Freq: Every day | INTRAMUSCULAR | Status: DC

## 2020-11-29 NOTE — Progress Notes (Signed)
Desats on low 80's on room air. Placed back on 2l [place]  pulse ox- 92%. Continue to monitor.

## 2020-11-29 NOTE — Progress Notes (Signed)
Heart Failure Navigator Progress Note  Assessed for Heart & Vascular TOC clinic readiness.  At this time the patient does not meet criteria due to advanced CKD (CrCl <20).   Navigator available for reassessment of patient.   Kerby Nora, PharmD, BCPS Heart Failure Stewardship Pharmacist Phone [phone]

## 2020-11-29 NOTE — Progress Notes (Signed)
Progress Note  Patient Name: Molly [name] Date of Encounter: 11/29/2020  [facility] Cardiologist: Jenne Campus, MD   Subjective   Doing better today than yesterday. We spent >30 min reviewing her recent history and events. Notable points: was taken off amlodipine earlier this year, denies side effects. Has never had significant leg swelling. Was on O2 after prior admission, was seen by pulmonology and told she doesn't need it. Did well up until presentation at Dr. Orpah Clinton office. We discussed the difference between systolic and diastolic heart failure and how we manage it. Her goals are to be able to ambulate, get to the bathroom herself, and ideally be off oxygen.  Inpatient Medications    Scheduled Meds:  amLODipine  10 mg Oral Daily   aspirin EC  81 mg Oral Daily   atorvastatin  40 mg Oral Daily   ezetimibe  10 mg Oral Daily   furosemide  40 mg Oral BID   hydrALAZINE  50 mg Oral Q8H   sodium chloride flush  3 mL Intravenous Q12H   Continuous Infusions:  sodium chloride     heparin 1,000 Units/hr (11/29/20 0423)   PRN Meds: sodium chloride, acetaminophen **OR** acetaminophen, albuterol, diphenhydrAMINE, insulin aspart, senna-docusate, sodium chloride flush   Vital Signs    Vitals:   11/29/20 0300 11/29/20 0343 11/29/20 0609 11/29/20 0715  BP:  (!) 167/53 (!) 168/48 (!) 153/47  Pulse: 69 70  68  Resp: '19 20  19  '$ Temp:  98.4 F (36.9 C)  98.7 F (37.1 C)  TempSrc:  Oral    SpO2: 97% 99%  96%  Weight:  66.9 kg      Intake/Output Summary (Last 24 hours) at 11/29/2020 0835 Last data filed at 11/29/2020 0650 Gross per 24 hour  Intake 710.92 ml  Output 2000 ml  Net -1289.08 ml   Last 3 Weights 11/29/2020 11/28/2020 11/27/2020  Weight (lbs) 147 lb 7.8 oz 157 lb 13.6 oz 162 lb 0.6 oz  Weight (kg) 66.9 kg 71.6 kg 73.5 kg      Telemetry    Sinus rhythm, average 70 bpm - Personally Reviewed  ECG    No new since 11/26/20 - Personally Reviewed  Physical  Exam   GEN: No acute distress.  [place] in place. Chronically ill appearing Neck: prominent carotid pulsation, JVD at low neck at 20 degrees (minimally elevated/upper limit of normal) Cardiac: RRR, no murmurs, rubs, or gallops.  Respiratory: Clear to auscultation bilaterally except for bibasilar rales GI: Soft, nontender, non-distended  MS: No edema; No deformity. Neuro:  Nonfocal, left eyelid droops slightly more than right, (not new but better seen today) Psych: Normal affect   Labs    High Sensitivity Troponin:   Recent Labs  Lab 11/27/20 0450 11/29/20 0548  TROPONINIHS 123* 162*      Chemistry Recent Labs  Lab 11/26/20 1226 11/26/20 1303 11/26/20 1755 11/27/20 0450 11/28/20 0146  NA 139   < > 141 140 141  K 4.3   < > 4.3 4.3 3.8  CL 101  --   --  104 100  CO2 27  --   --  28 31  GLUCOSE 56*  --   --  80 113*  BUN 48*  --   --  43* 40*  CREATININE 2.77*  --   --  2.91* 2.85*  CALCIUM 8.7*  --   --  8.4* 8.4*  PROT 7.6  --   --   --   --  ALBUMIN 4.1  --   --   --   --   AST 59*  --   --   --   --   ALT 80*  --   --   --   --   ALKPHOS 66  --   --   --   --   BILITOT 0.5  --   --   --   --   GFRNONAA 18*  --   --  17* 17*  ANIONGAP 11  --   --  8 10   < > = values in this interval not displayed.     Hematology Recent Labs  Lab 11/27/20 0450 11/28/20 0146 11/29/20 0041  WBC 5.2 5.6 5.0  RBC 3.82* 4.06 3.85*  HGB 10.4* 11.2* 10.6*  HCT 35.7* 37.5 35.2*  MCV 93.5 92.4 91.4  MCH 27.2 27.6 27.5  MCHC 29.1* 29.9* 30.1  RDW 15.4 15.2 15.2  PLT 202 200 200    BNP Recent Labs  Lab 11/26/20 1226  BNP 1,695.6*     DDimer  Recent Labs  Lab 11/26/20 1226  DDIMER 1.96*     Radiology    US RENAL  Result Date: 11/27/2020 CLINICAL DATA:  Chronic renal failure EXAM: RENAL / URINARY TRACT ULTRASOUND COMPLETE COMPARISON:  09/04/2020 FINDINGS: Right Kidney: Renal measurements: 10.3 x 4.3 x 4.8 cm. = volume: 108 mL. Mild extrarenal pelvis is noted. No  obstructive changes are seen. Left Kidney: Renal measurements: 8.7 x 4.3 x 4.3 cm. = volume: 83 mL. Echogenicity within normal limits. No mass or hydronephrosis visualized. Bladder: Appears normal for degree of bladder distention. Other: None. IMPRESSION: No acute abnormality noted. Electronically Signed   By: Inez Catalina M.D.   On: 11/27/2020 22:13   ECHOCARDIOGRAM COMPLETE  Result Date: 11/27/2020    ECHOCARDIOGRAM REPORT   Patient Name:   Molly [name] Date of Exam: 11/27/2020 Medical Rec #:  [id number]    Height:       62.0 in Accession #:    [id number]   Weight:       162.0 lb Date of Birth:  [birth year]    BSA:          1.748 m Patient Age:    70 years     BP:           148/78 mmHg Patient Gender: F            HR:           51 bpm. Exam Location:  Inpatient Procedure: 2D Echo, Cardiac Doppler and Color Doppler Indications:    CHF  History:        Patient has prior history of Echocardiogram examinations, most                 recent 08/31/2020. CHF, PVD, Signs/Symptoms:Shortness of Breath                 and Resp. failure, CKD; Risk Factors:Diabetes, Hypertension,                 Dyslipidemia and Obesity.  Sonographer:    Dustin Flock RDCS Referring Phys: [id number] [place]  1. Left ventricular ejection fraction, by estimation, is 60 to 65%. The left ventricle has normal function. The left ventricle has no regional wall motion abnormalities. There is mild left ventricular hypertrophy. Left ventricular diastolic parameters are consistent with Grade II diastolic dysfunction (pseudonormalization). Elevated left atrial pressure.  2. Right  ventricular systolic function is normal. The right ventricular size is normal. There is normal pulmonary artery systolic pressure.  3. A small pericardial effusion is present.  4. The mitral valve is normal in structure. No evidence of mitral valve regurgitation. No evidence of mitral stenosis.  5. The aortic valve is tricuspid. Aortic valve regurgitation  is not visualized. Mild aortic valve sclerosis is present, with no evidence of aortic valve stenosis.  6. The inferior vena cava is normal in size with greater than 50% respiratory variability, suggesting right atrial pressure of 3 mmHg. FINDINGS  Left Ventricle: Left ventricular ejection fraction, by estimation, is 60 to 65%. The left ventricle has normal function. The left ventricle has no regional wall motion abnormalities. The left ventricular internal cavity size was normal in size. There is  mild left ventricular hypertrophy. Left ventricular diastolic parameters are consistent with Grade II diastolic dysfunction (pseudonormalization). Elevated left atrial pressure. Right Ventricle: The right ventricular size is normal. Right ventricular systolic function is normal. There is normal pulmonary artery systolic pressure. The tricuspid regurgitant velocity is 2.70 m/s, and with an assumed right atrial pressure of 3 mmHg,  the estimated right ventricular systolic pressure is [id number] mmHg. Left Atrium: Left atrial size was normal in size. Right Atrium: Right atrial size was normal in size. Pericardium: A small pericardial effusion is present. Mitral Valve: The mitral valve is normal in structure. Mild mitral annular calcification. No evidence of mitral valve regurgitation. No evidence of mitral valve stenosis. Tricuspid Valve: The tricuspid valve is normal in structure. Tricuspid valve regurgitation is mild . No evidence of tricuspid stenosis. Aortic Valve: The aortic valve is tricuspid. Aortic valve regurgitation is not visualized. Mild aortic valve sclerosis is present, with no evidence of aortic valve stenosis. Pulmonic Valve: The pulmonic valve was not well visualized. Pulmonic valve regurgitation is trivial. No evidence of pulmonic stenosis. Aorta: The aortic root is normal in size and structure. Venous: The inferior vena cava is normal in size with greater than 50% respiratory variability, suggesting right atrial  pressure of 3 mmHg. IAS/Shunts: No atrial level shunt detected by color flow Doppler.  LEFT VENTRICLE PLAX 2D LVIDd:         3.70 cm  Diastology LVIDs:         2.70 cm  LV e' medial:    3.92 cm/s LV PW:         1.20 cm  LV E/e' medial:  23.8 LV IVS:        1.40 cm  LV e' lateral:   3.81 cm/s LVOT diam:     1.90 cm  LV E/e' lateral: 24.5 LV SV:         60 LV SV Index:   34 LVOT Area:     2.84 cm  RIGHT VENTRICLE RV Basal diam:  2.90 cm RV S prime:     7.72 cm/s TAPSE (M-mode): 1.8 cm LEFT ATRIUM           Index       RIGHT ATRIUM           Index LA diam:      4.30 cm 2.46 cm/m  RA Area:     15.90 cm LA Vol (A2C): 29.9 ml 17.10 ml/m RA Volume:   38.30 ml  21.91 ml/m LA Vol (A4C): 57.0 ml 32.61 ml/m  AORTIC VALVE LVOT Vmax:   84.90 cm/s LVOT Vmean:  53.800 cm/s LVOT VTI:    0.211 m  AORTA Ao Root diam:  3.10 cm MITRAL VALVE               TRICUSPID VALVE MV Area (PHT): 2.72 cm    TR Peak grad:   29.2 mmHg MV Decel Time: 279 msec    TR Vmax:        270.00 cm/s MV E velocity: 93.40 cm/s MV A velocity: 92.10 cm/s  SHUNTS MV E/A ratio:  1.01        Systemic VTI:  0.21 m                            Systemic Diam: 1.90 cm Kirk Ruths MD Electronically signed by Kirk Ruths MD Signature Date/Time: 11/27/2020/1:14:38 PM    Final     Cardiac Studies   Echocardiogram: 11/27/2020  1. Left ventricular ejection fraction, by estimation, is 60 to 65%. The  left ventricle has normal function. The left ventricle has no regional  wall motion abnormalities. There is mild left ventricular hypertrophy.  Left ventricular diastolic parameters  are consistent with Grade II diastolic dysfunction (pseudonormalization).  Elevated left atrial pressure.   2. Right ventricular systolic function is normal. The right ventricular  size is normal. There is normal pulmonary artery systolic pressure.   3. A small pericardial effusion is present.   4. The mitral valve is normal in structure. No evidence of mitral valve   regurgitation. No evidence of mitral stenosis.   5. The aortic valve is tricuspid. Aortic valve regurgitation is not  visualized. Mild aortic valve sclerosis is present, with no evidence of  aortic valve stenosis.   6. The inferior vena cava is normal in size with greater than 50%  respiratory variability, suggesting right atrial pressure of 3 mmHg.   Patient Profile     70 y.o. female with PMH PAD (bilateral SFA CTO and renal artery stenosis), coronary calcification, chronic kidney disease stage 3b-4, hypertension, hypercholesterolemia, type II diabetes who is being seen in consultation for management of acute on chronic diastolic heart failure at the request of Dr. Karleen Hampshire.  Assessment & Plan    Acute on chronic diastolic heart failure Hypertensive urgency Stage 3b-4 CKD Elevated troponin consistent with demand Type II diabetes on long term insulin Acute hypoxic respiratory failure -stopped cilostazol, as below -diastolic heart failure is bradycardia-sensitive. Stopped beta blocker this admission, was bradycardic on low dose carvedilol while admitted (and charted as being on 25 mg BID as an outpatient). Would not restart beta blocker -reports she did well on amlodipine before, unclear why it was stopped. Will restart today. -agree with continued diuresis with lasix, defer dosing to nephrology-->plan to change to oral per note -admission weight 73.5 kg, weight today 66.9 kg, charted net negative 4.4 L. -BMET pending this AM -with GFR <30, SGLT2i contraindicated. No ACEi, ARB, or MRA given renal disease unless nephrology orders -oxygen requirements improving, though notes she was off oxygen at home prior to admission   PAD: Coronary calcification without known CAD Hypercholesterolemia -continue aspirin, statin, ezetimibe -stopped cilostazol this admission as it is contraindicated in heart failure  Total time of encounter: 40 minutes total time of encounter, including >30 minutes  spent in face-to-face patient care. This time includes coordination of care and counseling regarding history, explanation of diastolic heart failure and management. Remainder of non-face-to-face time involved reviewing chart documents/testing relevant to the patient encounter and documentation in the medical record.  Buford Dresser, MD, PhD, [place]  For questions or updates, please contact [place] Please consult [email or web address] for contact info under        Signed, Buford Dresser, MD  11/29/2020, 8:35 AM

## 2020-11-29 NOTE — Progress Notes (Signed)
[facility] Progress Note  Patient: Molly [name]    [id number]  DOA: 11/26/2020     Date of Service: the patient was seen and examined on 11/29/2020  Brief hospital course: medical history significant for DMT2, HFpEF, HTN, HLD, PVD, CKD 4 who was transferred to [facility] from [facility] ER due to CHF exacerbation and respiratory failure with hypercapnia and hypoxia.  Found to have acute hypoxic and hypercapnic respiratory failure as well as acute on chronic diastolic CHF. Currently plan is continue diuresis and further work-up for hypercapnia.  Subjective: No nausea no vomiting.  No fever no chills.  Breathing better.  No chest pain.  No diarrhea right now but overnight had multiple rounds with mucousy bowel movement.  No bleeding no constipation.  Feels sleepy is unable to sleep last night.  Has asterixis and myoclonus.  She tells me that this has been present since his admission.  Assessment and Plan: 1.  Acute hypoxic and hypercapnic respiratory failure Acute on chronic diastolic CHF Hypoxia is likely secondary to acute on chronic diastolic CHF but unsure etiology of hypercapnia. Briefly required BiPAP on admission. Currently on 2 LPM. Saturations are normal. Started on IV diuresis. Due to her CKD nephrology consulted as well. Cardiology following as well. Currently on oral diuresis now. Echocardiogram showed preserved EF 60 to 65% without any wall motion abnormality. VQ scan currently pending.  2.  Acute kidney injury on CKD 3B.  Probable progression to CKD 4. Baseline serum creatinine 2.4. Admitted with serum creatinine 2.7. Currently stable. Ultrasound renal negative for hydronephrosis. Nephrology consulted appreciate assistance.  3.  Asterixis and myoclonus Acute metabolic encephalopathy Suspect this is associated with hypercapnia. Patient may have undiagnosed sleep apnea,?  Central sleep apnea as her body habitus is not consistent with obstructive sleep  apnea. Metabolic work-up shows normal ammonia level, normal TSH, normal B12, mildly high free T4.  Normal CRP. LFTs are normal as well. BUN is elevated but not significant enough to cause uremia therefore I do not think that that is the cause for patient's asterixis and hypercarbia. Will check ABG in the morning, if abnormal patient will require CPAP therapy on discharge.  Do not think the patient has any focal deficit for CNS imaging for now.  4.  HTN Blood pressure stable. Continue Norvasc and hydralazine.  5.  Type II DM, uncontrolled with hyperand hypoglycemia, without long-term insulin use with renal complication Hemoglobin 123456 8.2. Currently on sliding scale insulin. Monitor.  6.  Anemia of chronic kidney disease H&H stable. Monitor.  7.  Concern for PE. D-dimer elevated. Currently VQ scan ordered. Patient is on IV heparin empirically.  8.  PAD, HLD Continue statin and Zetia.  Stopped Pletal as it is contraindicated in heart failure.  Scheduled Meds:  amLODipine  10 mg Oral Daily   aspirin EC  81 mg Oral Daily   atorvastatin  40 mg Oral Daily   ezetimibe  10 mg Oral Daily   furosemide  40 mg Oral BID   hydrALAZINE  50 mg Oral Q8H   insulin aspart  0-15 Units Subcutaneous TID WC   insulin aspart  0-5 Units Subcutaneous QHS   sodium chloride flush  3 mL Intravenous Q12H   Continuous Infusions:  sodium chloride     heparin 1,000 Units/hr (11/29/20 1400)   PRN Meds: sodium chloride, acetaminophen **OR** acetaminophen, albuterol, insulin aspart, senna-docusate, sodium chloride flush  Body mass index is 26.98 kg/m.        DVT  Prophylaxis: Therapeutic Anticoagulation with heparin       Advance goals of care discussion: Pt is Full code.  Family Communication: family was present at bedside, at the time of interview.  The pt provided permission to discuss medical plan with the family. Opportunity was given to ask question and all questions were answered  satisfactorily.   Data Reviewed: I have personally reviewed and interpreted daily labs, tele strips, imaging. Potassium 3.3, creatinine 2.73.  Sodium 141.  Troponin mildly elevated.  Hemoglobin stable.  Physical Exam:  General: Appear in mild distress, no Rash; Oral Mucosa Clear, moist. no Abnormal Neck Mass Or lumps, Conjunctiva normal  Cardiovascular: S1 and S2 Present, no Murmur, Respiratory: good respiratory effort, Bilateral Air entry present and CTA, no Crackles, no wheezes Abdomen: Bowel Sound present, Soft and no tenderness Extremities: Trace pedal edema Neurology: Drowsy and oriented to time, place, and person affect appropriate. no new focal deficit, asterixis present Gait not checked due to patient safety concerns    Vitals:   11/29/20 0343 11/29/20 0609 11/29/20 0715 11/29/20 0910  BP: (!) 167/53 (!) 168/48 (!) 153/47 (!) 167/57  Pulse: 70  68   Resp: 20  19   Temp: 98.4 F (36.9 C)  98.7 F (37.1 C)   TempSrc: Oral     SpO2: 99%  96%   Weight: 66.9 kg       Disposition:  Status is: Inpatient  Remains inpatient appropriate because:Altered mental status  Dispo: The patient is from: Home              Anticipated d/c is to: Home              Patient currently is not medically stable to d/c.   Difficult to place patient No  Time spent: 35 minutes. I reviewed all nursing notes, pharmacy notes, vitals, pertinent old records. I have discussed plan of care as described above with RN.  Author: Berle Mull, MD [facility] 11/29/2020 3:54 PM  To reach On-call, see care teams to locate the attending and reach out via [email or web address]. Between 7PM-7AM, please contact night-coverage If you still have difficulty reaching the attending provider, please page the [facility] (Director on Call) for [facility] on amion for assistance.

## 2020-11-29 NOTE — Plan of Care (Signed)
  Problem: Education: Goal: Knowledge of General Education information will improve Description: Including pain rating scale, medication(s)/side effects and non-pharmacologic comfort measures Outcome: Progressing   Problem: Nutrition: Goal: Adequate nutrition will be maintained Outcome: Progressing   Problem: Coping: Goal: Level of anxiety will decrease Outcome: Progressing   

## 2020-11-29 NOTE — Progress Notes (Signed)
Patient ID: Molly [name], female   DOB: 1950-12-09, 70 y.o.   MRN: [id number] [facility] Progress Note   Assessment/ Plan:   1. 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  Labs requested this morning to assess for renal function/electrolytes.  Good response to diuretics overnight with net -2 L fluid balance and corresponding decrease in weight.  Will switch her to oral furosemide today-40 mg twice daily. 2.  Acute hypoxic respiratory failure: Likely secondary to exacerbation of diastolic heart failure based on chest x-ray/exam findings.  Ongoing good diuresis and will transition today to oral furosemide. 3.  Acute exacerbation of diastolic heart failure: Successful volume unloading with ongoing diuresis, transition to oral furosemide. 4.  Hypertension: Blood pressure marginally elevated, monitor with ongoing diuresis and hold ARB at this time. 5.  Anemia of chronic disease: No overt loss, no indication for ESA at this time.  Subjective:   Reports to be feeling better with decreased shortness of breath-states that she felt better with ambulation yesterday.  Refused BiPAP and insulin overnight.   Objective:   BP (!) 153/47   Pulse 68   Temp 98.7 F (37.1 C)   Resp 19   Wt 66.9 kg Comment: patient too tired to stand  SpO2 96%   BMI 26.98 kg/m   Intake/Output Summary (Last 24 hours) at 11/29/2020 0755 Last data filed at 11/29/2020 0650 Gross per 24 hour  Intake 950.92 ml  Output 2000 ml  Net -1049.08 ml   Weight change: -4.7 kg  Physical Exam: Gen: Comfortably resting in bed, on oxygen via nasal cannula.  Dyspneic towards the end of conversation CVS: Pulse regular rhythm, normal rate, S1 and S2 normal Resp: Anteriorly clear to auscultation, no rales/rhonchi Abd: Soft, flat, nontender, bowel sounds normal Ext: No palpable lower extremity  edema  Imaging: US RENAL  Result Date: 11/27/2020 CLINICAL DATA:  Chronic renal failure EXAM: RENAL / URINARY TRACT ULTRASOUND COMPLETE COMPARISON:  09/04/2020 FINDINGS: Right Kidney: Renal measurements: 10.3 x 4.3 x 4.8 cm. = volume: 108 mL. Mild extrarenal pelvis is noted. No obstructive changes are seen. Left Kidney: Renal measurements: 8.7 x 4.3 x 4.3 cm. = volume: 83 mL. Echogenicity within normal limits. No mass or hydronephrosis visualized. Bladder: Appears normal for degree of bladder distention. Other: None. IMPRESSION: No acute abnormality noted. Electronically Signed   By: Inez Catalina M.D.   On: 11/27/2020 22:13   ECHOCARDIOGRAM COMPLETE  Result Date: 11/27/2020    ECHOCARDIOGRAM REPORT   Patient Name:   Molly [name] Date of Exam: 11/27/2020 Medical Rec #:  [id number]    Height:       62.0 in Accession #:    [id number]   Weight:       162.0 lb Date of Birth:  [birth year]    BSA:          1.748 m Patient Age:    75 years     BP:           148/78 mmHg Patient Gender: F            HR:           51 bpm. Exam Location:  Inpatient Procedure: 2D Echo, Cardiac Doppler and Color Doppler Indications:    CHF  History:        Patient has prior history of Echocardiogram examinations, most  recent 08/31/2020. CHF, PVD, Signs/Symptoms:Shortness of Breath                 and Resp. failure, CKD; Risk Factors:Diabetes, Hypertension,                 Dyslipidemia and Obesity.  Sonographer:    Dustin Flock RDCS Referring Phys: [id number] [place]  1. Left ventricular ejection fraction, by estimation, is 60 to 65%. The left ventricle has normal function. The left ventricle has no regional wall motion abnormalities. There is mild left ventricular hypertrophy. Left ventricular diastolic parameters are consistent with Grade II diastolic dysfunction (pseudonormalization). Elevated left atrial pressure.  2. Right ventricular systolic function is normal. The right ventricular size is  normal. There is normal pulmonary artery systolic pressure.  3. A small pericardial effusion is present.  4. The mitral valve is normal in structure. No evidence of mitral valve regurgitation. No evidence of mitral stenosis.  5. The aortic valve is tricuspid. Aortic valve regurgitation is not visualized. Mild aortic valve sclerosis is present, with no evidence of aortic valve stenosis.  6. The inferior vena cava is normal in size with greater than 50% respiratory variability, suggesting right atrial pressure of 3 mmHg. FINDINGS  Left Ventricle: Left ventricular ejection fraction, by estimation, is 60 to 65%. The left ventricle has normal function. The left ventricle has no regional wall motion abnormalities. The left ventricular internal cavity size was normal in size. There is  mild left ventricular hypertrophy. Left ventricular diastolic parameters are consistent with Grade II diastolic dysfunction (pseudonormalization). Elevated left atrial pressure. Right Ventricle: The right ventricular size is normal. Right ventricular systolic function is normal. There is normal pulmonary artery systolic pressure. The tricuspid regurgitant velocity is 2.70 m/s, and with an assumed right atrial pressure of 3 mmHg,  the estimated right ventricular systolic pressure is [id number] mmHg. Left Atrium: Left atrial size was normal in size. Right Atrium: Right atrial size was normal in size. Pericardium: A small pericardial effusion is present. Mitral Valve: The mitral valve is normal in structure. Mild mitral annular calcification. No evidence of mitral valve regurgitation. No evidence of mitral valve stenosis. Tricuspid Valve: The tricuspid valve is normal in structure. Tricuspid valve regurgitation is mild . No evidence of tricuspid stenosis. Aortic Valve: The aortic valve is tricuspid. Aortic valve regurgitation is not visualized. Mild aortic valve sclerosis is present, with no evidence of aortic valve stenosis. Pulmonic Valve: The  pulmonic valve was not well visualized. Pulmonic valve regurgitation is trivial. No evidence of pulmonic stenosis. Aorta: The aortic root is normal in size and structure. Venous: The inferior vena cava is normal in size with greater than 50% respiratory variability, suggesting right atrial pressure of 3 mmHg. IAS/Shunts: No atrial level shunt detected by color flow Doppler.  LEFT VENTRICLE PLAX 2D LVIDd:         3.70 cm  Diastology LVIDs:         2.70 cm  LV e' medial:    3.92 cm/s LV PW:         1.20 cm  LV E/e' medial:  23.8 LV IVS:        1.40 cm  LV e' lateral:   3.81 cm/s LVOT diam:     1.90 cm  LV E/e' lateral: 24.5 LV SV:         60 LV SV Index:   34 LVOT Area:     2.84 cm  RIGHT VENTRICLE RV Basal diam:  2.90 cm RV S prime:     7.72 cm/s TAPSE (M-mode): 1.8 cm LEFT ATRIUM           Index       RIGHT ATRIUM           Index LA diam:      4.30 cm 2.46 cm/m  RA Area:     15.90 cm LA Vol (A2C): 29.9 ml 17.10 ml/m RA Volume:   38.30 ml  21.91 ml/m LA Vol (A4C): 57.0 ml 32.61 ml/m  AORTIC VALVE LVOT Vmax:   84.90 cm/s LVOT Vmean:  53.800 cm/s LVOT VTI:    0.211 m  AORTA Ao Root diam: 3.10 cm MITRAL VALVE               TRICUSPID VALVE MV Area (PHT): 2.72 cm    TR Peak grad:   29.2 mmHg MV Decel Time: 279 msec    TR Vmax:        270.00 cm/s MV E velocity: 93.40 cm/s MV A velocity: 92.10 cm/s  SHUNTS MV E/A ratio:  1.01        Systemic VTI:  0.21 m                            Systemic Diam: 1.90 cm Kirk Ruths MD Electronically signed by Kirk Ruths MD Signature Date/Time: 11/27/2020/1:14:38 PM    Final     Labs: BMET Recent Labs  Lab 11/26/20 1226 11/26/20 1303 11/26/20 1755 11/27/20 0450 11/28/20 0146  NA 139 141 141 140 141  K 4.3 4.2 4.3 4.3 3.8  CL 101  --   --  104 100  CO2 27  --   --  28 31  GLUCOSE 56*  --   --  80 113*  BUN 48*  --   --  43* 40*  CREATININE 2.77*  --   --  2.91* 2.85*  CALCIUM 8.7*  --   --  8.4* 8.4*   CBC Recent Labs  Lab 11/26/20 1226 11/26/20 1303  11/26/20 1755 11/27/20 0450 11/28/20 0146 11/29/20 0041  WBC 5.2  --   --  5.2 5.6 5.0  NEUTROABS 3.2  --   --   --   --   --   HGB 11.7*   < > 11.9* 10.4* 11.2* 10.6*  HCT 39.1   < > 35.0* 35.7* 37.5 35.2*  MCV 92.2  --   --  93.5 92.4 91.4  PLT 228  --   --  202 200 200   < > = values in this interval not displayed.    Medications:     aspirin EC  81 mg Oral Daily   atorvastatin  40 mg Oral Daily   ezetimibe  10 mg Oral Daily   furosemide  40 mg Intravenous BID   hydrALAZINE  50 mg Oral Q8H   sodium chloride flush  3 mL Intravenous Q12H   Elmarie Shiley, MD 11/29/2020, 7:55 AM

## 2020-11-29 NOTE — Progress Notes (Signed)
[place] for heparin Indication:  rule out pulmonary embolus  Allergies  Allergen Reactions   Lisinopril Other (See Comments)   Mercury Nausea And Vomiting    Patient Measurements: Weight: 66.9 kg (147 lb 7.8 oz) (patient too tired to stand) Heparin Dosing Weight: 67kg  Vital Signs: Temp: 98.7 F (37.1 C) (08/15 0715) Temp Source: Oral (08/15 0343) BP: 153/47 (08/15 0715) Pulse Rate: 68 (08/15 0715)  Labs: Recent Labs    11/26/20 1226 11/26/20 1303 11/27/20 0450 11/28/20 0146 11/29/20 0041 11/29/20 0548  HGB 11.7*   < > 10.4* 11.2* 10.6*  --   HCT 39.1   < > 35.7* 37.5 35.2*  --   PLT 228  --  202 200 200  --   HEPARINUNFRC  --   --  0.72* 0.52 0.41  --   CREATININE 2.77*  --  2.91* 2.85*  --   --   TROPONINIHS  --   --  123*  --   --  162*   < > = values in this interval not displayed.     Estimated Creatinine Clearance: 16.9 mL/min (A) (by C-G formula based on SCr of 2.85 mg/dL (H)).   Medical History: Past Medical History:  Diagnosis Date   Abdominal aortic atherosclerosis with stenosis 06/29/2015   AKI (acute kidney injury) ([place]) 06/29/2015   Atherosclerosis of coronary artery without angina pectoris 04/21/2020   Atopic dermatitis 04/21/2020   Benign hypertension with CKD (chronic kidney disease) stage III ([place]) 04/21/2020   Bilateral impacted cerumen 08/21/2019   Chest pain 08/31/2020   Cholecystitis 06/29/2015   Cholelithiasis 06/29/2015   Claudication in peripheral vascular disease ([place]) 08/28/2019   Peripheral arterial disease   Colon cancer screening 04/21/2020   Dehydration with hyponatremia 06/29/2015   Diabetes mellitus ([place]) 07/15/2018   Diarrhea 04/21/2020   DKA (diabetic ketoacidoses) 06/29/2015   Dyslipidemia 08/30/2018   Elevated troponin    Epigastric pain 04/21/2020   Gastroesophageal reflux disease 04/21/2020   HTN (hypertension) 06/29/2015   Hyperglycemia due to type 2 diabetes mellitus ([place]) 04/21/2020   Hypertension     Hypertensive emergency 07/12/2020   Hypertensive heart disease without congestive heart failure 04/21/2020   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 Unable to do CTA to rule out PE due to renal function, VQ scan ordered, will start heparin now.   Heparin level therapeutic at 0.41, CBC stable, VQ scan still pending.  Goal of Therapy:  Heparin level 0.3-0.7 units/ml Monitor platelets by anticoagulation protocol: Yes   Plan:  Continue heparin 1000 units/hr Daily heparin level and  CBC F/U VQ scan results  Erin Hearing PharmD., BCPS Clinical Pharmacist 11/29/2020 8:55 AM

## 2020-11-29 NOTE — Progress Notes (Signed)
RT note. Patient currently on 2L [place] sat 95%. Patient not requiring bipap at this time, no labored breathing noted. RT will continue to monitor.

## 2020-11-30 ENCOUNTER — Inpatient Hospital Stay (HOSPITAL_COMMUNITY): Payer: Medicare Other

## 2020-11-30 DIAGNOSIS — I5033 Acute on chronic diastolic (congestive) heart failure: Secondary | ICD-10-CM | POA: Diagnosis not present

## 2020-11-30 DIAGNOSIS — I10 Essential (primary) hypertension: Secondary | ICD-10-CM | POA: Diagnosis not present

## 2020-11-30 DIAGNOSIS — N184 Chronic kidney disease, stage 4 (severe): Secondary | ICD-10-CM | POA: Diagnosis not present

## 2020-11-30 DIAGNOSIS — R609 Edema, unspecified: Secondary | ICD-10-CM

## 2020-11-30 LAB — BLOOD GAS, ARTERIAL
Acid-Base Excess: 11 mmol/L — ABNORMAL HIGH (ref 0.0–2.0)
Bicarbonate: 36.2 mmol/L — ABNORMAL HIGH (ref 20.0–28.0)
FIO2: 28
O2 Saturation: 96.2 %
Patient temperature: 37.2
pCO2 arterial: 60.7 mmHg — ABNORMAL HIGH (ref 32.0–48.0)
pH, Arterial: 7.395 (ref 7.350–7.450)
pO2, Arterial: 78.6 mmHg — ABNORMAL LOW (ref 83.0–108.0)

## 2020-11-30 LAB — CBC
HCT: 38.6 % (ref 36.0–46.0)
Hemoglobin: 11.6 g/dL — ABNORMAL LOW (ref 12.0–15.0)
MCH: 27.5 pg (ref 26.0–34.0)
MCHC: 30.1 g/dL (ref 30.0–36.0)
MCV: 91.5 fL (ref 80.0–100.0)
Platelets: 206 10*3/uL (ref 150–400)
RBC: 4.22 MIL/uL (ref 3.87–5.11)
RDW: 15.2 % (ref 11.5–15.5)
WBC: 5.8 10*3/uL (ref 4.0–10.5)
nRBC: 0 % (ref 0.0–0.2)

## 2020-11-30 LAB — RENAL FUNCTION PANEL
Albumin: 3.2 g/dL — ABNORMAL LOW (ref 3.5–5.0)
Anion gap: 11 (ref 5–15)
BUN: 33 mg/dL — ABNORMAL HIGH (ref 8–23)
CO2: 36 mmol/L — ABNORMAL HIGH (ref 22–32)
Calcium: 8.4 mg/dL — ABNORMAL LOW (ref 8.9–10.3)
Chloride: 95 mmol/L — ABNORMAL LOW (ref 98–111)
Creatinine, Ser: 2.96 mg/dL — ABNORMAL HIGH (ref 0.44–1.00)
GFR, Estimated: 17 mL/min — ABNORMAL LOW (ref >60)
Glucose, Bld: 128 mg/dL — ABNORMAL HIGH (ref 70–99)
Phosphorus: 3.9 mg/dL (ref 2.5–4.6)
Potassium: 3.2 mmol/L — ABNORMAL LOW (ref 3.5–5.1)
Sodium: 142 mmol/L (ref 135–145)

## 2020-11-30 LAB — GLUCOSE, CAPILLARY
Glucose-Capillary: 113 mg/dL — ABNORMAL HIGH (ref 70–99)
Glucose-Capillary: 139 mg/dL — ABNORMAL HIGH (ref 70–99)
Glucose-Capillary: 192 mg/dL — ABNORMAL HIGH (ref 70–99)
Glucose-Capillary: 180 mg/dL — ABNORMAL HIGH (ref 70–99)
Glucose-Capillary: 211 mg/dL — ABNORMAL HIGH (ref 70–99)
Glucose-Capillary: 160 mg/dL — ABNORMAL HIGH (ref 70–99)

## 2020-11-30 LAB — HEPARIN LEVEL (UNFRACTIONATED)
Heparin Unfractionated: 0.97 IU/mL — ABNORMAL HIGH (ref 0.30–0.70)
Heparin Unfractionated: 0.84 IU/mL — ABNORMAL HIGH (ref 0.30–0.70)

## 2020-11-30 MED ORDER — POTASSIUM CHLORIDE CRYS ER 20 MEQ PO TBCR
40.0000 meq | EXTENDED_RELEASE_TABLET | Freq: Once | ORAL | Status: AC
  Administered 2020-11-30: 40 meq via ORAL
  Filled 2020-11-30: qty 2

## 2020-11-30 NOTE — Progress Notes (Addendum)
Patient ID: Molly [name], female   DOB: [birth year], 70 y.o.   MRN: [id number] [facility] Progress Note   Assessment/ Plan:   1. 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  Small rise of creatinine noted this morning with urine output of 3 L overnight following transition to oral furosemide.  I will decrease furosemide to 40 mg daily if creatinine rises further on labs tomorrow morning.  We will replace potassium (hypokalemia secondary to ongoing diuresis). 2.  Acute hypoxic respiratory failure: Likely secondary to exacerbation of diastolic heart failure based on chest x-ray/exam findings.  Although she does not have any peripheral edema, she remains dependent on oxygen supplementation and has rales on lung exam.  Continue efforts at weaning off of oxygen with ongoing diuresis/volume unloading. 3.  Acute exacerbation of diastolic heart failure: Responding well to ongoing diuresis-transitioned to oral furosemide yesterday. 4.  Hypertension: Blood pressure marginally elevated, monitor with ongoing diuresis and hold ARB at this time. 5.  Anemia of chronic disease: No overt loss, no indication for ESA at this time.  Subjective:   Reports to be feeling better but still apprehensive to lay flat in bed and still requiring oxygen via nasal cannula.   Objective:   BP (!) 141/52 (BP Location: Left Arm)   Pulse 71   Temp 98.7 F (37.1 C)   Resp 13   Wt 65.4 kg   SpO2 95%   BMI 26.37 kg/m   Intake/Output Summary (Last 24 hours) at 11/30/2020 0755 Last data filed at 11/30/2020 0400 Gross per 24 hour  Intake 313 ml  Output 550 ml  Net -237 ml   Weight change: -1.5 kg  Physical Exam: Gen: Appears to be comfortable resting propped up in bed with oxygen via nasal cannula CVS: Pulse regular rhythm, normal rate, S1 and S2 normal Resp: Posteriorly-fine rales  left base without associated rhonchi/wheeze in remaining lung fields Abd: Soft, flat, nontender, bowel sounds normal Ext: No palpable lower extremity edema  Imaging: No results found.  Labs: BMET Recent Labs  Lab 11/26/20 1226 11/26/20 1303 11/26/20 1755 11/27/20 0450 11/28/20 0146 11/29/20 1210 11/30/20 0147  NA 139 141 141 140 141 141 142  K 4.3 4.2 4.3 4.3 3.8 3.3* 3.2*  CL 101  --   --  104 100 93* 95*  CO2 27  --   --  28 31 37* 36*  GLUCOSE 56*  --   --  80 113* 132* 128*  BUN 48*  --   --  43* 40* 35* 33*  CREATININE 2.77*  --   --  2.91* 2.85* 2.73* 2.96*  CALCIUM 8.7*  --   --  8.4* 8.4* 8.0* 8.4*  PHOS  --   --   --   --   --   --  3.9   CBC Recent Labs  Lab 11/26/20 1226 11/26/20 1303 11/27/20 0450 11/28/20 0146 11/29/20 0041 11/30/20 0147  WBC 5.2  --  5.2 5.6 5.0 5.8  NEUTROABS 3.2  --   --   --   --   --   HGB 11.7*   < > 10.4* 11.2* 10.6* 11.6*  HCT 39.1   < > 35.7* 37.5 35.2* 38.6  MCV 92.2  --  93.5 92.4 91.4 91.5  PLT 228  --  202 200 200 206   < > = values in this  interval not displayed.    Medications:     amLODipine  10 mg Oral Daily   aspirin EC  81 mg Oral Daily   atorvastatin  40 mg Oral Daily   ezetimibe  10 mg Oral Daily   furosemide  40 mg Oral BID   hydrALAZINE  50 mg Oral Q8H   insulin aspart  0-15 Units Subcutaneous TID WC   insulin aspart  0-5 Units Subcutaneous QHS   potassium chloride  40 mEq Oral Once   sodium chloride flush  3 mL Intravenous Q12H   Elmarie Shiley, MD 11/30/2020, 7:55 AM

## 2020-11-30 NOTE — Progress Notes (Signed)
Bilateral lower extremity venous duplex has been completed. Preliminary results can be found in CV Proc through chart review.   11/30/20 3:38 PM Molly [name] RVT

## 2020-11-30 NOTE — Progress Notes (Signed)
Occupational Therapy Treatment Patient Details Name: Molly [name] MRN: [id number] DOB: [birth year] Today's Date: 11/30/2020    History of present illness Molly [name] is a 70 y.o. female who was transferred to [facility] from [facility] ER due to CHF exacerbation and respiratory failure with hypercapnia and hypoxia. [name]. PMH: DMT2, HFpEF, HTN, HLD, PVD, CKD 4   OT comments  Pt progressing well towards OT goals, able to demo improving endurance and balance with ADLs standing at sink. Pt able to demo short mobility with RW and without AD at min guard (though still noted to intermittently reach out for support). SpO2 sustained >95% on 1 L O2 during activities. DC recs remain appropriate.    Follow Up Recommendations  Home health OT;Supervision - Intermittent    Equipment Recommendations  None recommended by OT    Recommendations for Other Services      Precautions / Restrictions Precautions Precautions: Fall;Other (comment) Precaution Comments: monitor O2 (stopped using continuous O2 around June, but kept a tank for a while longer to use PRN.) Restrictions Weight Bearing Restrictions: No       Mobility Bed Mobility Overal bed mobility: Needs Assistance Bed Mobility: Supine to Sit     Supine to sit: Min assist;HOB elevated     General bed mobility comments: light min A via handheld assist to lift trunk    Transfers Overall transfer level: Needs assistance Equipment used: Rolling walker (2 wheeled) Transfers: Sit to/from Stand Sit to Stand: Supervision         General transfer comment: increased time and effort    Balance Overall balance assessment: Needs assistance;History of Falls Sitting-balance support: No upper extremity supported;Feet supported Sitting balance-Leahy Scale: Fair     Standing balance support: During functional activity;No upper extremity supported Standing balance-Leahy Scale: Fair Standing  balance comment: able to demo short mobility without AD though intermittent reaching out noted                           ADL either performed or assessed with clinical judgement   ADL Overall ADL's : Needs assistance/impaired     Grooming: Supervision/safety;Standing;Oral care;Wash/dry face Grooming Details (indicate cue type and reason): some assist for problem solving/sequencing though pt endorses sleepiness Assist to open toothpaste tube                             Functional mobility during ADLs: Min guard (with RW and without) General ADL Comments: Pt with improving cardiopulmonary tolerance (tolerating activities with 1 L O2) and standing balance. able to demo short distances in room without AD     Vision   Vision Assessment?: No apparent visual deficits   Perception     Praxis      Cognition Arousal/Alertness: Awake/alert Behavior During Therapy: WFL for tasks assessed/performed Overall Cognitive Status: No family/caregiver present to determine baseline cognitive functioning                                 General Comments: A&Ox4, some slower processing and problem solving though functional - sleepy        Exercises     Shoulder Instructions       General Comments SpO2 95% and above on 1 L O2    Pertinent Vitals/ Pain  Pain Assessment: No/denies pain  Home Living                                          Prior Functioning/Environment              Frequency  Min 2X/week        Progress Toward Goals  OT Goals(current goals can now be found in the care plan section)  Progress towards OT goals: Progressing toward goals  Acute Rehab OT Goals Patient Stated Goal: get better and go home soon OT Goal Formulation: With patient Time For Goal Achievement: 12/12/20 Potential to Achieve Goals: Good ADL Goals Pt Will Perform Grooming: with modified independence;standing Pt Will Perform Lower  Body Bathing: with modified independence;sit to/from stand Pt Will Transfer to Toilet: with modified independence;ambulating Pt/caregiver will Perform Home Exercise Program: Increased strength;Both right and left upper extremity;With theraband;Independently;With written HEP provided Additional ADL Goal #1: Pt to verbalize at least 2 energy conservation strategies to implement during ADLs/mobility at home  Plan Discharge plan remains appropriate    Co-evaluation                 AM-PAC OT "6 Clicks" Daily Activity     Outcome Measure   Help from another person eating meals?: None Help from another person taking care of personal grooming?: A Little Help from another person toileting, which includes using toliet, bedpan, or urinal?: A Little Help from another person bathing (including washing, rinsing, drying)?: A Little Help from another person to put on and taking off regular upper body clothing?: A Little Help from another person to put on and taking off regular lower body clothing?: A Little 6 Click Score: 19    End of Session Equipment Utilized During Treatment: Rolling walker;Oxygen  OT Visit Diagnosis: Unsteadiness on feet (R26.81);Other abnormalities of gait and mobility (R26.89);Muscle weakness (generalized) (M62.81)   Activity Tolerance Patient tolerated treatment well   Patient Left in chair;with call bell/phone within reach   Nurse Communication Mobility status        Time: [id number] OT Time Calculation (min): 23 min  Charges: OT General Charges $OT Visit: 1 Visit OT Treatments $Self Care/Home Management : 23-37 mins  [name], OTR/L Acute Rehab Services Office: [phone]    [name] 11/30/2020, 8:39 AM

## 2020-11-30 NOTE — Progress Notes (Signed)
[place] for heparin Indication:  rule out pulmonary embolus  Allergies  Allergen Reactions   Lisinopril Other (See Comments)   Mercury Nausea And Vomiting    Patient Measurements: Weight: 65.4 kg (144 lb 2.9 oz) Heparin Dosing Weight: 67kg  Vital Signs: Temp: 98.9 F (37.2 C) (08/16 0349) Temp Source: Oral (08/16 0349) BP: 163/48 (08/16 0538) Pulse Rate: 71 (08/16 0349)  Labs: Recent Labs    11/28/20 0146 11/29/20 0041 11/29/20 0548 11/29/20 1210 11/30/20 0147  HGB 11.2* 10.6*  --   --  11.6*  HCT 37.5 35.2*  --   --  38.6  PLT 200 200  --   --  206  HEPARINUNFRC 0.52 0.41  --   --  0.97*  CREATININE 2.85*  --   --  2.73* 2.96*  TROPONINIHS  --   --  162*  --   --      Estimated Creatinine Clearance: 16.1 mL/min (A) (by C-G formula based on SCr of 2.96 mg/dL (H)).  Assessment: 70 y.o. female with possible PE for heparin  Goal of Therapy:  Heparin level 0.3-0.7 units/ml Monitor platelets by anticoagulation protocol: Yes   Plan:  Decrease Heparin 850 units/hr F/U VQ scan results  Phillis Knack, PharmD, BCPS  11/30/2020 7:18 AM

## 2020-11-30 NOTE — Plan of Care (Signed)
  Problem: Education: Goal: Knowledge of General Education information will improve Description Including pain rating scale, medication(s)/side effects and non-pharmacologic comfort measures Outcome: Progressing   

## 2020-11-30 NOTE — Progress Notes (Signed)
[place] for IV Heparin Indication:  rule out pulmonary embolus  Allergies  Allergen Reactions   Lisinopril Other (See Comments)   Mercury Nausea And Vomiting   Patient Measurements: Weight: 65.4 kg (144 lb 2.9 oz) Heparin Dosing Weight:  67 kg  Vital Signs: Temp: 98.3 F (36.8 C) (08/16 1200) Temp Source: Oral (08/16 1200) BP: 148/51 (08/16 1441) Pulse Rate: 66 (08/16 1200)  Labs: Recent Labs    11/28/20 0146 11/29/20 0041 11/29/20 0548 11/29/20 1210 11/30/20 0147 11/30/20 1706  HGB 11.2* 10.6*  --   --  11.6*  --   HCT 37.5 35.2*  --   --  38.6  --   PLT 200 200  --   --  206  --   HEPARINUNFRC 0.52 0.41  --   --  0.97* 0.84*  CREATININE 2.85*  --   --  2.73* 2.96*  --   TROPONINIHS  --   --  162*  --   --   --     Estimated Creatinine Clearance: 16.1 mL/min (A) (by C-G formula based on SCr of 2.96 mg/dL (H)).  Assessment: 70 year old female admitted from [facility] for SOB and confusion. Unable to do CTA to rule out PE due to renal function, VQ scan ordered. Dopplers: no evidence of DVT in either lower extremity. Pharmacy is consulted to dose IV heparin for possible PE.  Heparin level ~8.5 hrs after heparin infusion was decreased to 850 units/hr was 0.84 units/ml, which is above the goal range for this pt. H/H 11.6/38.6, plt 206 (CBC stable). Per RN, no issues with IV or bleeding observed.  Goal of Therapy:  Heparin level 0.3-0.7 units/ml Monitor platelets by anticoagulation protocol: Yes   Plan:  Decrease heparin infusion to 700 units/hr Check 8-hr heparin level Monitor daily heparin level, CBC Monitor for bleeding F/U VQ scan results  Gillermina Hu, PharmD, BCPS, [facility] Clinical Pharmacist 11/30/2020 6:18 PM

## 2020-11-30 NOTE — Progress Notes (Signed)
Progress Note  Patient Name: Molly [name] Date of Encounter: 11/30/2020  [facility] Cardiologist: Jenne Campus, MD   Subjective   Breathing improved somewhat today, not at prior baseline. Sitting up in chair this AM. Asking about timing of when she can go home.  Inpatient Medications    Scheduled Meds:  amLODipine  10 mg Oral Daily   aspirin EC  81 mg Oral Daily   atorvastatin  40 mg Oral Daily   ezetimibe  10 mg Oral Daily   furosemide  40 mg Oral BID   hydrALAZINE  50 mg Oral Q8H   insulin aspart  0-15 Units Subcutaneous TID WC   insulin aspart  0-5 Units Subcutaneous QHS   potassium chloride  40 mEq Oral Once   sodium chloride flush  3 mL Intravenous Q12H   Continuous Infusions:  sodium chloride     heparin 1,000 Units/hr (11/29/20 1900)   PRN Meds: sodium chloride, acetaminophen **OR** acetaminophen, albuterol, insulin aspart, senna-docusate, sodium chloride flush   Vital Signs    Vitals:   11/29/20 2330 11/30/20 0349 11/30/20 0538 11/30/20 0733  BP:  (!) 163/48 (!) 163/48 (!) 141/52  Pulse:  71    Resp: (!) '21 17  13  '$ Temp: 98.2 F (36.8 C) 98.9 F (37.2 C)  98.7 F (37.1 C)  TempSrc: Oral Oral    SpO2:  95%    Weight:  65.4 kg      Intake/Output Summary (Last 24 hours) at 11/30/2020 0841 Last data filed at 11/30/2020 0400 Gross per 24 hour  Intake 303 ml  Output 550 ml  Net -247 ml   Last 3 Weights 11/30/2020 11/29/2020 11/28/2020  Weight (lbs) 144 lb 2.9 oz 147 lb 7.8 oz 157 lb 13.6 oz  Weight (kg) 65.4 kg 66.9 kg 71.6 kg      Telemetry    Sinus rhythm, average 70 bpm. (Telemetry reads VT but this is artifact) - Personally Reviewed  ECG    No new since 11/26/20 - Personally Reviewed  Physical Exam   GEN: Chronically ill appearing but in no acute distress. [place] in place NECK: No JVD sitting upright CARDIAC: regular rhythm, normal S1 and S2, no rubs or gallops. No murmur. VASCULAR: Radial pulses 2+ bilaterally.  RESPIRATORY:  Clear to  auscultation except for bibasilar rales  ABDOMEN: Soft, non-tender, non-distended MUSCULOSKELETAL:  Moves all 4 limbs independently SKIN: Warm and dry, no edema NEUROLOGIC:  No focal neuro deficits noted (unchanged mild left eyelid droop) PSYCHIATRIC:  Normal affect    Labs    High Sensitivity Troponin:   Recent Labs  Lab 11/27/20 0450 11/29/20 0548  TROPONINIHS 123* 162*      Chemistry Recent Labs  Lab 11/26/20 1226 11/26/20 1303 11/28/20 0146 11/29/20 1210 11/30/20 0147  NA 139   < > 141 141 142  K 4.3   < > 3.8 3.3* 3.2*  CL 101   < > 100 93* 95*  CO2 27   < > 31 37* 36*  GLUCOSE 56*   < > 113* 132* 128*  BUN 48*   < > 40* 35* 33*  CREATININE 2.77*   < > 2.85* 2.73* 2.96*  CALCIUM 8.7*   < > 8.4* 8.0* 8.4*  PROT 7.6  --   --  6.1*  --   ALBUMIN 4.1  --   --  3.0* 3.2*  AST 59*  --   --  41  --   ALT 80*  --   --  46*  --   ALKPHOS 66  --   --  55  --   BILITOT 0.5  --   --  1.2  --   GFRNONAA 18*   < > 17* 18* 17*  ANIONGAP 11   < > '10 11 11   '$ < > = values in this interval not displayed.     Hematology Recent Labs  Lab 11/28/20 0146 11/29/20 0041 11/30/20 0147  WBC 5.6 5.0 5.8  RBC 4.06 3.85* 4.22  HGB 11.2* 10.6* 11.6*  HCT 37.5 35.2* 38.6  MCV 92.4 91.4 91.5  MCH 27.6 27.5 27.5  MCHC 29.9* 30.1 30.1  RDW 15.2 15.2 15.2  PLT 200 200 206    BNP Recent Labs  Lab 11/26/20 1226  BNP 1,695.6*     DDimer  Recent Labs  Lab 11/26/20 1226  DDIMER 1.96*     Radiology    No results found.  Cardiac Studies   Echocardiogram: 11/27/2020  1. Left ventricular ejection fraction, by estimation, is 60 to 65%. The  left ventricle has normal function. The left ventricle has no regional  wall motion abnormalities. There is mild left ventricular hypertrophy.  Left ventricular diastolic parameters  are consistent with Grade II diastolic dysfunction (pseudonormalization).  Elevated left atrial pressure.   2. Right ventricular systolic function is  normal. The right ventricular  size is normal. There is normal pulmonary artery systolic pressure.   3. A small pericardial effusion is present.   4. The mitral valve is normal in structure. No evidence of mitral valve  regurgitation. No evidence of mitral stenosis.   5. The aortic valve is tricuspid. Aortic valve regurgitation is not  visualized. Mild aortic valve sclerosis is present, with no evidence of  aortic valve stenosis.   6. The inferior vena cava is normal in size with greater than 50%  respiratory variability, suggesting right atrial pressure of 3 mmHg.   Patient Profile     70 y.o. female with PMH PAD (bilateral SFA CTO and renal artery stenosis), coronary calcification, chronic kidney disease stage 3b-4, hypertension, hypercholesterolemia, type II diabetes who is being seen in consultation for management of acute on chronic diastolic heart failure at the request of Dr. Karleen Hampshire.  Assessment & Plan    Acute on chronic diastolic heart failure Hypertensive urgency Stage 3b-4 CKD Elevated troponin consistent with demand Type II diabetes on long term insulin Acute hypoxic respiratory failure -stopped cilostazol, as below -diastolic heart failure is bradycardia-sensitive. Stopped beta blocker this admission, was bradycardic on low dose carvedilol while admitted (and charted as being on 25 mg BID as an outpatient). Would not restart beta blocker -blood pressure improved slightly this am, continue amlodipine 10 mg daily (restarted this admission). Continue hydralazine 50 mg q8h, can increase if additional BP control needed -agree with continued diuresis with lasix, defer dosing to nephrology--per note changing to 40 mg PO daily -admission weight 73.5 kg, weight today 65.4 kg, charted net negative 4.6 L. -hypokalemic at 3.2, being replaced per nephrology -Cr 2.96 today -with GFR <30, SGLT2i contraindicated. No ACEi, ARB, or MRA given renal disease unless nephrology orders -oxygen  requirements improving, though notes she was off oxygen at home prior to admission   PAD: Coronary calcification without known CAD Hypercholesterolemia -continue aspirin, statin, ezetimibe -stopped cilostazol this admission as it is contraindicated in heart failure  Concern for PE: -per primary team, heparin planned to continue until she can have VQ scan. Defer to primary team.  Shawna Orleans  Harrell Gave, MD, PhD, [place]     For questions or updates, please contact Welcome Please consult [email or web address] for contact info under        Signed, Buford Dresser, MD  11/30/2020, 8:41 AM

## 2020-11-30 NOTE — Progress Notes (Signed)
[facility] Progress Note  Patient: Molly [name]    [id number]  DOA: 11/26/2020     Date of Service: the patient was seen and examined on 11/30/2020  Brief hospital course: Past medical history significant for DMT2, HFpEF, HTN, HLD, PVD, CKD 4 who was transferred to [facility] from [facility] ER due to CHF exacerbation and respiratory failure with hypercapnia and hypoxia.  Found to have acute hypoxic and hypercapnic respiratory failure as well as acute on chronic diastolic CHF.  Currently plan is continue to monitor for improvement in mentation and monitor renal function.  Subjective: Feeling better.  No nausea no vomiting no fever no chills.  No chest pain abdominal pain.  Still sleepy.  Still with asterixis.  Assessment and Plan: 1.  Acute hypoxic and hypercapnic respiratory failure Acute on chronic diastolic CHF Hypoxia is likely secondary to acute on chronic diastolic CHF but unsure etiology of hypercapnia. Briefly required BiPAP on admission. Currently on 2 LPM. Saturations are normal. Started on IV diuresis. Due to her CKD nephrology consulted as well. Cardiology following as well. Currently on oral diuresis now. Echocardiogram showed preserved EF 60 to 65% without any wall motion abnormality. VQ scan currently pending.  Lower extremity Doppler negative.  Patient afraid to lie flat for the VQ scan.   2.  Acute kidney injury on CKD 3B.  Probable progression to CKD 4. Baseline serum creatinine 2.4. Admitted with serum creatinine 2.7. Currently stable. Ultrasound renal negative for hydronephrosis. Nephrology consulted appreciate assistance.   3.  Asterixis and myoclonus Acute metabolic encephalopathy Suspect this is associated with hypercapnia. Patient may have undiagnosed sleep apnea,?  Central sleep apnea as her body habitus is not consistent with obstructive sleep apnea. Metabolic work-up shows normal ammonia level, normal TSH, normal B12, mildly high free T4.   Normal CRP. LFTs are normal as well. BUN is elevated but not significant enough to cause uremia therefore I do not think that that is the cause for patient's asterixis and hypercarbia. Morning ABG shows evidence of respiratory acidosis with PCO2 of 60.7. Patient would benefit from having the CPAP therapy outpatient. Sleep study recommended outpatient. Discussed with pulmonary.  TOC consulted.  4.  HTN Blood pressure stable. Continue Norvasc and hydralazine.   5.  Type II DM, uncontrolled with hyperand hypoglycemia, without long-term insulin use with renal complication Hemoglobin 123456 8.2. Currently on sliding scale insulin. Monitor.   6.  Anemia of chronic kidney disease H&H stable. Monitor.   7.  Concern for PE. D-dimer elevated. Currently VQ scan ordered. Patient is on IV heparin empirically.   8.  PAD, HLD Continue statin and Zetia.  Stopped Pletal as it is contraindicated in heart failure.  Scheduled Meds:  amLODipine  10 mg Oral Daily   aspirin EC  81 mg Oral Daily   atorvastatin  40 mg Oral Daily   ezetimibe  10 mg Oral Daily   furosemide  40 mg Oral BID   hydrALAZINE  50 mg Oral Q8H   insulin aspart  0-15 Units Subcutaneous TID WC   insulin aspart  0-5 Units Subcutaneous QHS   sodium chloride flush  3 mL Intravenous Q12H   Continuous Infusions:  sodium chloride     heparin 850 Units/hr (11/30/20 0849)   PRN Meds: sodium chloride, acetaminophen **OR** acetaminophen, albuterol, insulin aspart, senna-docusate, sodium chloride flush  Body mass index is 26.37 kg/m.        DVT Prophylaxis: Therapeutic Anticoagulation with heparin  Advance goals of care discussion: Pt is Full code.  Family Communication: no family was present at bedside, at the time of interview.   Data Reviewed: I have personally reviewed and interpreted daily labs, tele strips, imaging. Serum creatinine rising from 2.73-2.96.  BUN 34-33.  Potassium 3.2.  Sodium stable. ABG shows  evidence of primary metabolic alkalosis with secondary respiratory acidosis, PCO2 60.7.  CBC stable.  Physical Exam:  General: Appear in mild distress, no Rash; Oral Mucosa Clear, moist. no Abnormal Neck Mass Or lumps, Conjunctiva normal  Cardiovascular: S1 and S2 Present, no Murmur, Respiratory: good respiratory effort, Bilateral Air entry present and CTA, no Crackles, no wheezes Abdomen: Bowel Sound present, Soft and no tenderness Extremities: no Pedal edema Neurology: alert and oriented to time, place, and person affect appropriate. no new focal deficit, asterixis present.  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 and chronic ptosis on the left per patient Gait not checked due to patient safety concerns  Vitals:   11/30/20 0850 11/30/20 0906 11/30/20 1200 11/30/20 1441  BP:  (!) 142/51 (!) 144/104 (!) 148/51  Pulse:   66   Resp: 20  16   Temp:   98.3 F (36.8 C)   TempSrc:   Oral   SpO2:   97%   Weight:        Disposition:  Status is: Inpatient  Remains inpatient appropriate because:IV treatments appropriate due to intensity of illness or inability to take PO and Inpatient level of care appropriate due to severity of illness  Dispo: The patient is from: Home              Anticipated d/c is to: Home              Patient currently is not medically stable to d/c.   Difficult to place patient No  Time spent: 35 minutes. I reviewed all nursing notes, pharmacy notes, vitals, pertinent old records. I have discussed plan of care as described above with RN.  Author: Berle Mull, MD [facility] 11/30/2020 6:16 PM  To reach On-call, see care teams to locate the attending and reach out via [email or web address]. Between 7PM-7AM, please contact night-coverage If you still have difficulty reaching the attending provider, please page the [facility] (Director on Call) for [facility] on amion for assistance.

## 2020-11-30 NOTE — Progress Notes (Signed)
Physical Therapy Treatment Patient Details Name: Molly [name] MRN: [id number] DOB: Nov 30, 1950 Today's Date: 11/30/2020    History of Present Illness Molly [name] is a 70 y.o. female who was transferred to [facility] from [facility] ER due to CHF exacerbation and respiratory failure with hypercapnia and hypoxia. [name]. PMH: DMT2, HFpEF, HTN, HLD, PVD, CKD 4    PT Comments    Pt received in chair, very lethargic and difficult to easily awaken (pt took ~5 minutes to awaken enough to take sips of drink and follow instructions without falling back asleep) but participatory with increased time/encouragement. Pt with slow processing and decreased safety awareness, focus on seated/standing exercises and transfer training due to pt lethargy, unable to safely progress gait training this date due to pt drowsiness. Pt continues to benefit from PT services to progress toward functional mobility goals. Continue to recommend HHPT.  Follow Up Recommendations  Home health PT (pending progress)     Equipment Recommendations  None recommended by PT    Recommendations for Other Services       Precautions / Restrictions Precautions Precautions: Fall;Other (comment) Precaution Comments: monitor O2 (stopped using continuous O2 around June, but kept a tank for a while longer to use PRN.) Restrictions Weight Bearing Restrictions: No    Mobility  Bed Mobility               General bed mobility comments: pt reclined upon arrival in chair and remained up in chair at end of session for lunch    Transfers Overall transfer level: Needs assistance Equipment used: Rolling walker (2 wheeled) Transfers: Sit to/from [facility] Sit to Stand: Min guard Stand pivot transfers: Min assist       General transfer comment: increased time and effort, pt drowsy and needs max cues for proper technique/hand placement and still not following  instructions properly at times (pt tending to push up into standing with both hands on RW rather than from chair armrests). Pt needs steadying/AD assist with pivotal steps to/from [facility].  Ambulation/Gait             General Gait Details: defer, vascular clinician entering room for pt LE imaging after she performed standing exercises and stand pivot to/from [facility]   Stairs             Wheelchair Mobility    Modified Rankin (Stroke Patients Only)       Balance Overall balance assessment: Needs assistance;History of Falls Sitting-balance support: No upper extremity supported;Feet supported Sitting balance-Leahy Scale: Fair     Standing balance support: During functional activity;No upper extremity supported Standing balance-Leahy Scale: Poor Standing balance comment: pt unsteady with dynamic standing at RW this date (lethargic throughout so may be more due to drowsiness)                            Cognition Arousal/Alertness: Awake/alert Behavior During Therapy: WFL for tasks assessed/performed Overall Cognitive Status: No family/caregiver present to determine baseline cognitive functioning                                 General Comments: Pt very drowsy initially (awoken from nap in chair) and took ~10 minutes to wake up enough to be able to take pills she had forgotten to take (RN notified). Pt with some slower processing and problem  solving and not following some safety cues (she remained somewhat drowsy throughout).      Exercises Other Exercises Other Exercises: seated BLE AROM: hip flexion, LAQ, ankle pumps x10-15 reps ea Other Exercises: standing BLE AROM: heel raises, hip flexion x10 reps ea Other Exercises: reciprocal STS x 5 reps from chair (pt pushing on RW ignoring cues to push from chair/knees)    General Comments General comments (skin integrity, edema, etc.): SpO2 97% on 2L [place], HR 60's-80's bpm (briefly spiking into >120 with  mobility although per RN likely due to tele leads moving/artifact and not accurate); BP 125/49 (72) seated in chair, no dizziness with standing and unable to take post-mobility BP reading as vascular team member coming in to do imaging.      Pertinent Vitals/Pain Pain Assessment: No/denies pain    Home Living                      Prior Function            PT Goals (current goals can now be found in the care plan section) Acute Rehab PT Goals Patient Stated Goal: get better and go home soon PT Goal Formulation: With patient Time For Goal Achievement: 12/12/20 Progress towards PT goals: Progressing toward goals (slow progress)    Frequency    Min 3X/week      PT Plan Current plan remains appropriate    Co-evaluation              AM-PAC PT "6 Clicks" Mobility   Outcome Measure  Help needed turning from your back to your side while in a flat bed without using bedrails?: A Little Help needed moving from lying on your back to sitting on the side of a flat bed without using bedrails?: A Little Help needed moving to and from a bed to a chair (including a wheelchair)?: A Little Help needed standing up from a chair using your arms (e.g., wheelchair or bedside chair)?: A Little Help needed to walk in hospital room?: A Little Help needed climbing 3-5 steps with a railing? : A Little 6 Click Score: 18    End of Session Equipment Utilized During Treatment: Gait belt;Oxygen Activity Tolerance: Patient limited by lethargy;Other (comment) (drowsy and some LUE tremors/muscle fasciculations which were worse when first waking up) Patient left: in chair;with call bell/phone within reach;Other (comment) (vascular team member entering room with equipment) Nurse Communication: Mobility status;Other (comment) (pt drowsy needed max encouragement/increased time to take pills she had forgotten on her lap) PT Visit Diagnosis: Unsteadiness on feet (R26.81);Other abnormalities of gait  and mobility (R26.89);Muscle weakness (generalized) (M62.81);Difficulty in walking, not elsewhere classified (R26.2)     Time: [id number] PT Time Calculation (min) (ACUTE ONLY): 28 min  Charges:  $Therapeutic Exercise: 8-22 mins $Therapeutic Activity: 8-22 mins                     Catcher Dehoyos P., PTA Acute Rehabilitation Services Pager: [phone] Office: [place] 11/30/2020, 2:12 PM

## 2020-12-01 DIAGNOSIS — I5033 Acute on chronic diastolic (congestive) heart failure: Secondary | ICD-10-CM | POA: Diagnosis not present

## 2020-12-01 DIAGNOSIS — N184 Chronic kidney disease, stage 4 (severe): Secondary | ICD-10-CM | POA: Diagnosis not present

## 2020-12-01 DIAGNOSIS — I10 Essential (primary) hypertension: Secondary | ICD-10-CM | POA: Diagnosis not present

## 2020-12-01 LAB — GLUCOSE, CAPILLARY
Glucose-Capillary: 147 mg/dL — ABNORMAL HIGH (ref 70–99)
Glucose-Capillary: 123 mg/dL — ABNORMAL HIGH (ref 70–99)
Glucose-Capillary: 153 mg/dL — ABNORMAL HIGH (ref 70–99)
Glucose-Capillary: 143 mg/dL — ABNORMAL HIGH (ref 70–99)

## 2020-12-01 LAB — RENAL FUNCTION PANEL
Albumin: 3.3 g/dL — ABNORMAL LOW (ref 3.5–5.0)
Anion gap: 9 (ref 5–15)
BUN: 35 mg/dL — ABNORMAL HIGH (ref 8–23)
CO2: 39 mmol/L — ABNORMAL HIGH (ref 22–32)
Calcium: 8.7 mg/dL — ABNORMAL LOW (ref 8.9–10.3)
Chloride: 92 mmol/L — ABNORMAL LOW (ref 98–111)
Creatinine, Ser: 2.94 mg/dL — ABNORMAL HIGH (ref 0.44–1.00)
GFR, Estimated: 17 mL/min — ABNORMAL LOW (ref >60)
Glucose, Bld: 157 mg/dL — ABNORMAL HIGH (ref 70–99)
Phosphorus: 2.6 mg/dL (ref 2.5–4.6)
Potassium: 3.5 mmol/L (ref 3.5–5.1)
Sodium: 140 mmol/L (ref 135–145)

## 2020-12-01 LAB — CBC
HCT: 40.7 % (ref 36.0–46.0)
Hemoglobin: 12.3 g/dL (ref 12.0–15.0)
MCH: 27.2 pg (ref 26.0–34.0)
MCHC: 30.2 g/dL (ref 30.0–36.0)
MCV: 90 fL (ref 80.0–100.0)
Platelets: 215 10*3/uL (ref 150–400)
RBC: 4.52 MIL/uL (ref 3.87–5.11)
RDW: 15.2 % (ref 11.5–15.5)
WBC: 6.7 10*3/uL (ref 4.0–10.5)
nRBC: 0 % (ref 0.0–0.2)

## 2020-12-01 LAB — HEPARIN LEVEL (UNFRACTIONATED)
Heparin Unfractionated: 0.75 IU/mL — ABNORMAL HIGH (ref 0.30–0.70)
Heparin Unfractionated: 0.61 IU/mL (ref 0.30–0.70)

## 2020-12-01 MED ORDER — ENOXAPARIN SODIUM 40 MG/0.4ML IJ SOSY: 40.0000 mg | PREFILLED_SYRINGE | INTRAMUSCULAR | Status: DC

## 2020-12-01 MED ORDER — ENOXAPARIN SODIUM 30 MG/0.3ML IJ SOSY
30.0000 mg | PREFILLED_SYRINGE | INTRAMUSCULAR | Status: DC
  Administered 2020-12-02: 30 mg via SUBCUTANEOUS
  Filled 2020-12-01 (×2): qty 0.3

## 2020-12-01 MED ORDER — POTASSIUM CHLORIDE CRYS ER 20 MEQ PO TBCR
40.0000 meq | EXTENDED_RELEASE_TABLET | Freq: Once | ORAL | Status: AC
  Administered 2020-12-01: 40 meq via ORAL
  Filled 2020-12-01: qty 2

## 2020-12-01 MED ORDER — FUROSEMIDE 40 MG PO TABS
40.0000 mg | ORAL_TABLET | Freq: Every day | ORAL | Status: DC
  Administered 2020-12-01: 40 mg via ORAL
  Filled 2020-12-01: qty 1

## 2020-12-01 NOTE — Progress Notes (Signed)
Progress Note  Patient Name: Molly [name] Date of Encounter: 12/01/2020  [facility] Cardiologist: Jenne Campus, MD   Subjective   She did not sleep well, had back pain and did not fall asleep until about 5 AM. Breathing stable, but she is not sure she can lie flat for VQ scan. Asking when she can go home.  Inpatient Medications    Scheduled Meds:  amLODipine  10 mg Oral Daily   aspirin EC  81 mg Oral Daily   atorvastatin  40 mg Oral Daily   ezetimibe  10 mg Oral Daily   furosemide  40 mg Oral Daily   hydrALAZINE  50 mg Oral Q8H   insulin aspart  0-15 Units Subcutaneous TID WC   insulin aspart  0-5 Units Subcutaneous QHS   potassium chloride  40 mEq Oral Once   sodium chloride flush  3 mL Intravenous Q12H   Continuous Infusions:  sodium chloride     heparin 600 Units/hr (12/01/20 0350)   PRN Meds: sodium chloride, acetaminophen **OR** acetaminophen, albuterol, insulin aspart, senna-docusate, sodium chloride flush   Vital Signs    Vitals:   12/01/20 0243 12/01/20 0345 12/01/20 0400 12/01/20 0717  BP: (!) 148/41  (!) 149/58 (!) 138/107  Pulse: 67 69 67 68  Resp: '18 18 16 20  '$ Temp: 98.6 F (37 C)   98.7 F (37.1 C)  TempSrc: Oral   Oral  SpO2: 95% 98% 97% 99%  Weight:        Intake/Output Summary (Last 24 hours) at 12/01/2020 1046 Last data filed at 12/01/2020 [id number] Gross per 24 hour  Intake 301.28 ml  Output 350 ml  Net -48.72 ml   Last 3 Weights 11/30/2020 11/29/2020 11/28/2020  Weight (lbs) 144 lb 2.9 oz 147 lb 7.8 oz 157 lb 13.6 oz  Weight (kg) 65.4 kg 66.9 kg 71.6 kg      Telemetry    Sinus rhythm, average 65-70 bpm, rare PVCs. (Telemetry reads VT but this is artifact) - Personally Reviewed  ECG    No new since 11/26/20 - Personally Reviewed  Physical Exam   GEN: chronically ill appearing, [place] in place, no acute distress NECK: No JVD sitting upright CARDIAC: regular rhythm, normal S1 and S2, no rubs or gallops. No murmur. VASCULAR: Radial  pulses 2+ bilaterally.  RESPIRATORY:  Distant breath sounds but largely clear ABDOMEN: Soft, non-tender, non-distended MUSCULOSKELETAL:  Moves all 4 limbs independently SKIN: Warm and dry, no edema NEUROLOGIC:  No new focal neuro deficits noted (unchanged left eyelid droop_ PSYCHIATRIC:  Normal affect    Labs    High Sensitivity Troponin:   Recent Labs  Lab 11/27/20 0450 11/29/20 0548  TROPONINIHS 123* 162*      Chemistry Recent Labs  Lab 11/26/20 1226 11/26/20 1303 11/29/20 1210 11/30/20 0147 12/01/20 0257  NA 139   < > 141 142 140  K 4.3   < > 3.3* 3.2* 3.5  CL 101   < > 93* 95* 92*  CO2 27   < > 37* 36* 39*  GLUCOSE 56*   < > 132* 128* 157*  BUN 48*   < > 35* 33* 35*  CREATININE 2.77*   < > 2.73* 2.96* 2.94*  CALCIUM 8.7*   < > 8.0* 8.4* 8.7*  PROT 7.6  --  6.1*  --   --   ALBUMIN 4.1  --  3.0* 3.2* 3.3*  AST 59*  --  41  --   --  ALT 80*  --  46*  --   --   ALKPHOS 66  --  55  --   --   BILITOT 0.5  --  1.2  --   --   GFRNONAA 18*   < > 18* 17* 17*  ANIONGAP 11   < > '11 11 9   '$ < > = values in this interval not displayed.     Hematology Recent Labs  Lab 11/29/20 0041 11/30/20 0147 12/01/20 0257  WBC 5.0 5.8 6.7  RBC 3.85* 4.22 4.52  HGB 10.6* 11.6* 12.3  HCT 35.2* 38.6 40.7  MCV 91.4 91.5 90.0  MCH 27.5 27.5 27.2  MCHC 30.1 30.1 30.2  RDW 15.2 15.2 15.2  PLT 200 206 215    BNP Recent Labs  Lab 11/26/20 1226  BNP 1,695.6*     DDimer  Recent Labs  Lab 11/26/20 1226  DDIMER 1.96*     Radiology    VAS [place] LOWER EXTREMITY VENOUS (DVT)  Result Date: 11/30/2020  Lower Venous DVT Study Patient Name:  SHANISA BONICA  Date of Exam:   11/30/2020 Medical Rec #: [id number]     Accession #:    [id number] Date of Birth: 05/28/1950     Patient Gender: F Patient Age:   32 years Exam Location:  [facility] Procedure:      VAS [place] LOWER EXTREMITY VENOUS (DVT) Referring Phys: PRANAV PATEL  --------------------------------------------------------------------------------  Indications: Edema.  Risk Factors: None identified. Comparison Study: No prior studies. Performing Technologist: Oliver Hum RVT  Examination Guidelines: A complete evaluation includes B-mode imaging, spectral Doppler, color Doppler, and power Doppler as needed of all accessible portions of each vessel. Bilateral testing is considered an integral part of a complete examination. Limited examinations for reoccurring indications may be performed as noted. The reflux portion of the exam is performed with the patient in reverse Trendelenburg.  +---------+---------------+---------+-----------+----------+--------------+ RIGHT    CompressibilityPhasicitySpontaneityPropertiesThrombus Aging +---------+---------------+---------+-----------+----------+--------------+ CFV      Full           Yes      Yes                                 +---------+---------------+---------+-----------+----------+--------------+ SFJ      Full                                                        +---------+---------------+---------+-----------+----------+--------------+ FV Prox  Full                                                        +---------+---------------+---------+-----------+----------+--------------+ FV Mid   Full                                                        +---------+---------------+---------+-----------+----------+--------------+ FV DistalFull                                                        +---------+---------------+---------+-----------+----------+--------------+  PFV      Full                                                        +---------+---------------+---------+-----------+----------+--------------+ POP      Full           Yes      Yes                                 +---------+---------------+---------+-----------+----------+--------------+ PTV      Full                                                         +---------+---------------+---------+-----------+----------+--------------+ PERO     Full                                                        +---------+---------------+---------+-----------+----------+--------------+   +---------+---------------+---------+-----------+----------+--------------+ LEFT     CompressibilityPhasicitySpontaneityPropertiesThrombus Aging +---------+---------------+---------+-----------+----------+--------------+ CFV      Full           Yes      Yes                                 +---------+---------------+---------+-----------+----------+--------------+ SFJ      Full                                                        +---------+---------------+---------+-----------+----------+--------------+ FV Prox  Full                                                        +---------+---------------+---------+-----------+----------+--------------+ FV Mid   Full                                                        +---------+---------------+---------+-----------+----------+--------------+ FV DistalFull                                                        +---------+---------------+---------+-----------+----------+--------------+ PFV      Full                                                        +---------+---------------+---------+-----------+----------+--------------+  POP      Full           Yes      Yes                                 +---------+---------------+---------+-----------+----------+--------------+ PTV      Full                                                        +---------+---------------+---------+-----------+----------+--------------+ PERO     Full                                                        +---------+---------------+---------+-----------+----------+--------------+     Summary: RIGHT: - There is no evidence of deep vein thrombosis in the  lower extremity.  - No cystic structure found in the popliteal fossa.  LEFT: - There is no evidence of deep vein thrombosis in the lower extremity.  - No cystic structure found in the popliteal fossa.  *See table(s) above for measurements and observations. Electronically signed by Deitra Mayo MD on 11/30/2020 at 4:44:14 PM.    Final     Cardiac Studies   Echocardiogram: 11/27/2020  1. Left ventricular ejection fraction, by estimation, is 60 to 65%. The  left ventricle has normal function. The left ventricle has no regional  wall motion abnormalities. There is mild left ventricular hypertrophy.  Left ventricular diastolic parameters  are consistent with Grade II diastolic dysfunction (pseudonormalization).  Elevated left atrial pressure.   2. Right ventricular systolic function is normal. The right ventricular  size is normal. There is normal pulmonary artery systolic pressure.   3. A small pericardial effusion is present.   4. The mitral valve is normal in structure. No evidence of mitral valve  regurgitation. No evidence of mitral stenosis.   5. The aortic valve is tricuspid. Aortic valve regurgitation is not  visualized. Mild aortic valve sclerosis is present, with no evidence of  aortic valve stenosis.   6. The inferior vena cava is normal in size with greater than 50%  respiratory variability, suggesting right atrial pressure of 3 mmHg.   Patient Profile     70 y.o. female with PMH PAD (bilateral SFA CTO and renal artery stenosis), coronary calcification, chronic kidney disease stage 3b-4, hypertension, hypercholesterolemia, type II diabetes who is being seen in consultation for management of acute on chronic diastolic heart failure at the request of Dr. Karleen Hampshire.  Assessment & Plan    Acute on chronic diastolic heart failure Hypertensive urgency Stage 3b-4 CKD Elevated troponin consistent with demand Type II diabetes on long term insulin Acute hypoxic respiratory  failure -stopped cilostazol, as below -diastolic heart failure is bradycardia-sensitive. Stopped beta blocker this admission, was bradycardic on low dose carvedilol while admitted (and charted as being on 25 mg BID as an outpatient). Would not restart beta blocker -blood pressure improved slightly this am, continue amlodipine 10 mg daily (restarted this admission). Continue hydralazine 50 mg q8h, can increase if additional BP control needed -agree with continued diuresis with lasix, defer dosing to nephrology--per note 40 mg PO daily -  admission weight 73.5 kg, weight today not yet documented, wt 8/16 65.4 kg, charted net negative 4.4 L. -Cr 2.94 today. Follows with [facility] nephrology as an outpatient -with GFR <30, SGLT2i contraindicated. No ACEi, ARB, or MRA given renal disease -wean O2 as tolerated   PAD: Coronary calcification without known CAD Hypercholesterolemia -continue aspirin, statin, ezetimibe -stopped cilostazol this admission as it is contraindicated in heart failure  Concern for PE: -per primary team, heparin planned to continue until she can have VQ scan. Defer to primary team.  [facility] will sign off.   Medication Recommendations:  as currently ordered Other recommendations (labs, testing, etc):  none Follow up as an outpatient:  We will arrange for follow up with Dr. Agustin Cree or a member of his team.   Buford Dresser, MD, PhD, [place]     For questions or updates, please contact [place] Please consult [email or web address] for contact info under        Signed, Buford Dresser, MD  12/01/2020, 10:46 AM

## 2020-12-01 NOTE — Care Management Important Message (Signed)
Important Message  Patient Details  Name: Molly [name] MRN: [id number] Date of Birth: 02/28/1951   Medicare Important Message Given:  Yes     Bernardette Waldron Montine Circle 12/01/2020, 9:59 AM

## 2020-12-01 NOTE — Progress Notes (Signed)
PT Cancellation Note  Patient Details Name: Molly [name] MRN: [id number] DOB: [birth year]   Cancelled Treatment:    Reason Eval/Treat Not Completed: (P) Patient declined, no reason specified (pt finishing up dinner, reports she has been to/from chair and BSC multiple times and too tired to get OOB.) Will continue efforts next date per PT POC as schedule permits.   Kara Pacer [name] 12/01/2020, 5:58 PM

## 2020-12-01 NOTE — Progress Notes (Signed)
RT note. Patient placed on auto cpap 15/5 with 2L bled in line. Patient currently sat 95% and tolerating at this time. RT will continue to monitor

## 2020-12-01 NOTE — Progress Notes (Signed)
[place] for IV Heparin Indication:  rule out pulmonary embolus  Allergies  Allergen Reactions   Lisinopril Other (See Comments)   [place] Nausea And Vomiting   Patient Measurements: Weight: 65.4 kg (144 lb 2.9 oz) Heparin Dosing Weight:  67 kg  Vital Signs: Temp: 98.7 F (37.1 C) (08/17 1104) Temp Source: Oral (08/17 1104) BP: 148/52 (08/17 1104) Pulse Rate: 70 (08/17 1104)  Labs: Recent Labs    11/29/20 0041 11/29/20 0548 11/29/20 1210 11/30/20 0147 11/30/20 1706 12/01/20 0257 12/01/20 1132  HGB 10.6*  --   --  11.6*  --  12.3  --   HCT 35.2*  --   --  38.6  --  40.7  --   PLT 200  --   --  206  --  215  --   HEPARINUNFRC 0.41  --   --  0.97* 0.84* 0.75* 0.61  CREATININE  --   --  2.73* 2.96*  --  2.94*  --   TROPONINIHS  --  162*  --   --   --   --   --     Estimated Creatinine Clearance: 16.2 mL/min (A) (by C-G formula based on SCr of 2.94 mg/dL (H)).  Assessment: 70 year old female admitted from [facility] for SOB and confusion. Unable to do CTA to rule out PE due to renal function, VQ scan ordered. Dopplers: no evidence of DVT in either lower extremity. Pharmacy is consulted to dose IV heparin for possible PE.  Heparin level now at goal after rate adjustment overnight. CBC stable. No issues with IV or bleeding observed.  Goal of Therapy:  Heparin level 0.3-0.7 units/ml Monitor platelets by anticoagulation protocol: Yes   Plan:  Continue heparin at 600 units/hr Monitor daily heparin level, CBC Monitor for bleeding F/U VQ scan results  Erin Hearing PharmD., BCPS Clinical Pharmacist 12/01/2020 1:00 PM

## 2020-12-01 NOTE — Plan of Care (Signed)

## 2020-12-01 NOTE — Progress Notes (Signed)
[facility] Progress Note  Patient: Molly [name]    [id number]  DOA: 11/26/2020     Date of Service: the patient was seen and examined on 12/01/2020  Brief hospital course: Past medical history significant for DMT2, HFpEF, HTN, HLD, PVD, CKD 4 who was transferred to [facility] from [facility] ER due to CHF exacerbation and respiratory failure with hypercapnia and hypoxia.  Found to have acute hypoxic and hypercapnic respiratory failure as well as acute on chronic diastolic CHF. -Was also noted to have a mildly elevated D-dimer, VQ scan is pending, Dopplers are negative for DVT  Subjective: Feels okay overall, breathing is improving  Assessment and Plan:  Acute hypoxic and hypercapnic respiratory failure Acute on chronic diastolic CHF -Clinically improving, briefly required BiPAP on admission -Improving with diuresis, she is -4.4 L, now switched to p.o. Lasix -Appreciate cardiology and nephrology input, 2D echo noted EF of 60-65% without wall motion abnormalities, carvedilol discontinued -my clinical suspicion for PE is very low, was started on heparin by my partner on account of mildly elevated D-dimer and VQ scan ordered, Dopplers are negative for DVT, RV function is normal, no RV dilation on echo   Acute kidney injury on CKD 3B.  Probable progression to CKD 4. Baseline serum creatinine 2.4. -Creatinine now 2.9, likely worsening in the setting of cardiorenal syndrome -Renal ultrasound negative for hydronephrosis -Nephrology input appreciated, Lasix changed to p.o. -Monitor urine output, BMP in a.m., followed by nephrology at [place] and myoclonus Acute metabolic encephalopathy -Likely secondary to have hypercapnia, resolved  -Metabolic work-up was unrevealing including normal ammonia level, normal TSH, normal B12, mildly high free T4.  Normal CRP. -Will send referral for sleep study at discharge  HTN Blood pressure stable. Continue Norvasc and  hydralazine.   Type II DM, uncontrolled with hyperand hypoglycemia, without long-term insulin use with renal complication Hemoglobin 123456 8.2. Currently on sliding scale insulin. Monitor.   Anemia of chronic kidney disease -Stable, monitor   Abnormal-dimer  -I think this is nonspecific, suspect her symptoms were secondary to fluid overload, started on heparin, VQ scan ordered by Dr. Posey Pronto, will follow   PAD, HLD Continue statin and Zetia.  Stopped Pletal as it is contraindicated in heart failure.    DVT Prophylaxis: Therapeutic Anticoagulation with heparin     Advance goals of care discussion: Pt is Full code.  Family Communication: no family was present at bedside, at the time of interview.    Disposition:  Status is: Inpatient  Remains inpatient appropriate because:IV treatments appropriate due to intensity of illness or inability to take PO and Inpatient level of care appropriate due to severity of illness  Dispo: The patient is from: Home              Anticipated d/c is to: Home              Patient currently is not medically stable to d/c.   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 on the left   Vitals:   12/01/20 0345 12/01/20 0400 12/01/20 0717 12/01/20 1104  BP:  (!) 149/58 (!) 138/107 (!) 148/52  Pulse: 69 67 68 70  Resp: '18 16 20 18  '$ Temp:  98.7 F (37.1 C) 98.7 F (37.1 C)  TempSrc:   Oral Oral  SpO2: 98% 97% 99% 95%  Weight:       Scheduled Meds:  amLODipine  10 mg Oral Daily   aspirin EC  81 mg Oral Daily   atorvastatin  40 mg Oral Daily   ezetimibe  10 mg Oral Daily   furosemide  40 mg Oral Daily   hydrALAZINE  50 mg Oral Q8H   insulin aspart  0-15 Units Subcutaneous TID  WC   insulin aspart  0-5 Units Subcutaneous QHS   sodium chloride flush  3 mL Intravenous Q12H   Continuous Infusions:  sodium chloride     heparin 600 Units/hr (12/01/20 1435)   PRN Meds: sodium chloride, acetaminophen **OR** acetaminophen, albuterol, insulin aspart, senna-docusate, sodium chloride flush  Body mass index is 26.37 kg/m.    Time spent: 35 minutes. Domenic Polite MD [facility] 12/01/2020 3:12 PM

## 2020-12-01 NOTE — Progress Notes (Signed)
[place] for IV Heparin Indication:  rule out pulmonary embolus  Allergies  Allergen Reactions   Lisinopril Other (See Comments)   Mercury Nausea And Vomiting   Patient Measurements: Weight: 65.4 kg (144 lb 2.9 oz) Heparin Dosing Weight:  67 kg  Vital Signs: Temp: 98.6 F (37 C) (08/17 0243) Temp Source: Oral (08/17 0243) BP: 148/41 (08/17 0243) Pulse Rate: 67 (08/17 0243)  Labs: Recent Labs    11/29/20 0041 11/29/20 0548 11/29/20 1210 11/30/20 0147 11/30/20 1706 12/01/20 0257  HGB 10.6*  --   --  11.6*  --  12.3  HCT 35.2*  --   --  38.6  --  40.7  PLT 200  --   --  206  --  215  HEPARINUNFRC 0.41  --   --  0.97* 0.84* 0.75*  CREATININE  --   --  2.73* 2.96*  --   --   TROPONINIHS  --  162*  --   --   --   --     Estimated Creatinine Clearance: 16.1 mL/min (A) (by C-G formula based on SCr of 2.96 mg/dL (H)).  Assessment: 70 year old female admitted from [facility] for SOB and confusion. Unable to do CTA to rule out PE due to renal function, VQ scan ordered. Dopplers: no evidence of DVT in either lower extremity. Pharmacy is consulted to dose IV heparin for possible PE.  Heparin level ~8.5 hrs after heparin infusion was decreased to 850 units/hr was 0.84 units/ml, which is above the goal range for this pt. H/H 11.6/38.6, plt 206 (CBC stable). Per RN, no issues with IV or bleeding observed.  8/17 AM update: Heparin level high but trending down Hgb stable  Goal of Therapy:  Heparin level 0.3-0.7 units/ml Monitor platelets by anticoagulation protocol: Yes   Plan:  Decrease heparin infusion to 600 units/hr Check 8-hr heparin level Monitor daily heparin level, CBC Monitor for bleeding F/U VQ scan results  Narda Bonds, PharmD, BCPS Clinical Pharmacist Phone: [phone]

## 2020-12-01 NOTE — Progress Notes (Signed)
Patient ID: Molly [name], female   DOB: Aug 29, 1950, 70 y.o.   MRN: [id number] Molly [name] Progress Note   Assessment/ Plan:   1. 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  Creatinine essentially unchanged overnight with unimpressive urine output charted.  She continues to have dyspnea with oxygen dependency and it may be prudent to retry VQ scan today.  I will decrease furosemide to 40 mg daily based on current clinical assessment. 2.  Acute hypoxic respiratory failure: Likely secondary to exacerbation of diastolic heart failure based on chest x-ray/exam findings.  Although she does not have any peripheral edema, she remains dependent on oxygen supplementation and has rales on lung exam.  Continue efforts at weaning off of oxygen-she appears euvolemic on physical exam and alternate explanation is being sought. 3.  Acute exacerbation of diastolic heart failure: Responding well to ongoing diuresis-transitioned to oral furosemide yesterday. 4.  Hypertension: Blood pressure marginally elevated on amlodipine/hydralazine, monitor with ongoing diuresis and hold ARB at this time. 5.  Anemia of chronic disease: No overt loss, no indication for ESA at this time.  Will sign off at this time and remain available for questions or concerns.  She follows up with [facility]-Dr. Neta [name] who she will see following discharge.  Subjective:   Reports improvement of her shortness of breath-denies any chest pain overnight.   Objective:   BP (!) 138/107   Pulse 68   Temp 98.7 F (37.1 C) (Oral)   Resp 20   Wt 65.4 kg   SpO2 99%   BMI 26.37 kg/m   Intake/Output Summary (Last 24 hours) at 12/01/2020 0732 Last data filed at 12/01/2020 [id number] Gross per 24 hour  Intake 501.28 ml  Output 350 ml  Net 151.28 ml   Weight change:   Physical  Exam: Gen: Comfortably propped up in bed, on oxygen via nasal cannula CVS: Pulse regular rhythm, normal rate, S1 and S2 normal Resp: Decreased breath sounds over bases-poor inspiratory effort Abd: Soft, flat, nontender, bowel sounds normal Ext: No palpable lower extremity edema  Imaging: VAS [place] LOWER EXTREMITY VENOUS (DVT)  Result Date: 11/30/2020  Lower Venous DVT Study Patient Name:  Molly [name]  Date of Exam:   11/30/2020 Medical Rec #: [id number]     Accession #:    [id number] Date of Birth: Sep 03, 1950     Patient Gender: F Patient Age:   34 years Exam Location:  [facility] Procedure:      VAS [place] LOWER EXTREMITY VENOUS (DVT) Referring Phys: PRANAV Maico Mulvehill --------------------------------------------------------------------------------  Indications: Edema.  Risk Factors: None identified. Comparison Study: No prior studies. Performing Technologist: Oliver Hum RVT  Examination Guidelines: A complete evaluation includes B-mode imaging, spectral Doppler, color Doppler, and power Doppler as needed of all accessible portions of each vessel. Bilateral testing is considered an integral part of a complete examination. Limited examinations for reoccurring indications may be performed as noted. The reflux portion of the exam is performed with the patient in reverse Trendelenburg.  +---------+---------------+---------+-----------+----------+--------------+ RIGHT    CompressibilityPhasicitySpontaneityPropertiesThrombus Aging +---------+---------------+---------+-----------+----------+--------------+ CFV      Full           Yes      Yes                                 +---------+---------------+---------+-----------+----------+--------------+  SFJ      Full                                                        +---------+---------------+---------+-----------+----------+--------------+ FV Prox  Full                                                         +---------+---------------+---------+-----------+----------+--------------+ FV Mid   Full                                                        +---------+---------------+---------+-----------+----------+--------------+ FV DistalFull                                                        +---------+---------------+---------+-----------+----------+--------------+ PFV      Full                                                        +---------+---------------+---------+-----------+----------+--------------+ POP      Full           Yes      Yes                                 +---------+---------------+---------+-----------+----------+--------------+ PTV      Full                                                        +---------+---------------+---------+-----------+----------+--------------+ PERO     Full                                                        +---------+---------------+---------+-----------+----------+--------------+   +---------+---------------+---------+-----------+----------+--------------+ LEFT     CompressibilityPhasicitySpontaneityPropertiesThrombus Aging +---------+---------------+---------+-----------+----------+--------------+ CFV      Full           Yes      Yes                                 +---------+---------------+---------+-----------+----------+--------------+ SFJ      Full                                                        +---------+---------------+---------+-----------+----------+--------------+  FV Prox  Full                                                        +---------+---------------+---------+-----------+----------+--------------+ FV Mid   Full                                                        +---------+---------------+---------+-----------+----------+--------------+ FV DistalFull                                                         +---------+---------------+---------+-----------+----------+--------------+ PFV      Full                                                        +---------+---------------+---------+-----------+----------+--------------+ POP      Full           Yes      Yes                                 +---------+---------------+---------+-----------+----------+--------------+ PTV      Full                                                        +---------+---------------+---------+-----------+----------+--------------+ PERO     Full                                                        +---------+---------------+---------+-----------+----------+--------------+     Summary: RIGHT: - There is no evidence of deep vein thrombosis in the lower extremity.  - No cystic structure found in the popliteal fossa.  LEFT: - There is no evidence of deep vein thrombosis in the lower extremity.  - No cystic structure found in the popliteal fossa.  *See table(s) above for measurements and observations. Electronically signed by Deitra Mayo MD on 11/30/2020 at 4:44:14 PM.    Final     Labs: BMET Recent Labs  Lab 11/26/20 1226 11/26/20 1303 11/26/20 1755 11/27/20 0450 11/28/20 0146 11/29/20 1210 11/30/20 0147 12/01/20 0257  NA 139 141 141 140 141 141 142 140  K 4.3 4.2 4.3 4.3 3.8 3.3* 3.2* 3.5  CL 101  --   --  104 100 93* 95* 92*  CO2 27  --   --  28 31 37* 36* 39*  GLUCOSE 56*  --   --  80 113* 132* 128* 157*  BUN 48*  --   --  43*  40* 35* 33* 35*  CREATININE 2.77*  --   --  2.91* 2.85* 2.73* 2.96* 2.94*  CALCIUM 8.7*  --   --  8.4* 8.4* 8.0* 8.4* 8.7*  PHOS  --   --   --   --   --   --  3.9 2.6   CBC Recent Labs  Lab 11/26/20 1226 11/26/20 1303 11/28/20 0146 11/29/20 0041 11/30/20 0147 12/01/20 0257  WBC 5.2   < > 5.6 5.0 5.8 6.7  NEUTROABS 3.2  --   --   --   --   --   HGB 11.7*   < > 11.2* 10.6* 11.6* 12.3  HCT 39.1   < > 37.5 35.2* 38.6 40.7  MCV 92.2   < > 92.4 91.4 91.5  90.0  PLT 228   < > 200 200 206 215   < > = values in this interval not displayed.    Medications:     amLODipine  10 mg Oral Daily   aspirin EC  81 mg Oral Daily   atorvastatin  40 mg Oral Daily   ezetimibe  10 mg Oral Daily   furosemide  40 mg Oral BID   hydrALAZINE  50 mg Oral Q8H   insulin aspart  0-15 Units Subcutaneous TID WC   insulin aspart  0-5 Units Subcutaneous QHS   sodium chloride flush  3 mL Intravenous Q12H   Elmarie Shiley, MD 12/01/2020, 7:32 AM

## 2020-12-02 LAB — RENAL FUNCTION PANEL
Albumin: 3 g/dL — ABNORMAL LOW (ref 3.5–5.0)
Anion gap: 12 (ref 5–15)
BUN: 35 mg/dL — ABNORMAL HIGH (ref 8–23)
CO2: 37 mmol/L — ABNORMAL HIGH (ref 22–32)
Calcium: 8.8 mg/dL — ABNORMAL LOW (ref 8.9–10.3)
Chloride: 90 mmol/L — ABNORMAL LOW (ref 98–111)
Creatinine, Ser: 3.38 mg/dL — ABNORMAL HIGH (ref 0.44–1.00)
GFR, Estimated: 14 mL/min — ABNORMAL LOW (ref >60)
Glucose, Bld: 138 mg/dL — ABNORMAL HIGH (ref 70–99)
Phosphorus: 2.3 mg/dL — ABNORMAL LOW (ref 2.5–4.6)
Potassium: 4.2 mmol/L (ref 3.5–5.1)
Sodium: 139 mmol/L (ref 135–145)

## 2020-12-02 LAB — GLUCOSE, CAPILLARY
Glucose-Capillary: 162 mg/dL — ABNORMAL HIGH (ref 70–99)
Glucose-Capillary: 127 mg/dL — ABNORMAL HIGH (ref 70–99)
Glucose-Capillary: 109 mg/dL — ABNORMAL HIGH (ref 70–99)
Glucose-Capillary: 125 mg/dL — ABNORMAL HIGH (ref 70–99)

## 2020-12-02 LAB — CBC
HCT: 39.8 % (ref 36.0–46.0)
Hemoglobin: 12.2 g/dL (ref 12.0–15.0)
MCH: 27.6 pg (ref 26.0–34.0)
MCHC: 30.7 g/dL (ref 30.0–36.0)
MCV: 90 fL (ref 80.0–100.0)
Platelets: 211 10*3/uL (ref 150–400)
RBC: 4.42 MIL/uL (ref 3.87–5.11)
RDW: 15.1 % (ref 11.5–15.5)
WBC: 7.5 10*3/uL (ref 4.0–10.5)
nRBC: 0 % (ref 0.0–0.2)

## 2020-12-02 NOTE — Progress Notes (Signed)
[facility] Progress Note  Patient: Molly [name]    [id number]  DOA: 11/26/2020     Date of Service: the patient was seen and examined on 12/02/2020  Brief hospital course: Past medical history significant for DMT2, HFpEF, HTN, HLD, PVD, CKD 4 who was transferred to [facility] from [facility] ER due to CHF exacerbation and respiratory failure with hypercapnia and hypoxia.  Found to have acute hypoxic and hypercapnic respiratory failure as well as acute on chronic diastolic CHF. -Was also noted to have a mildly elevated D-dimer, VQ scan is pending, Dopplers are negative for DVT  Subjective: Feels tired, wants to go home, denies any dyspnea  Assessment and Plan:  Acute hypoxic and hypercapnic respiratory failure Acute on chronic diastolic CHF -Respiratory status improving, weaning off O2 today -briefly required BiPAP on admission -Improved with diuresis, she is -4.4 L, was then switched to p.o. -Appreciate cardiology and nephrology input, 2D echo noted EF of 60-65% without wall motion abnormalities, carvedilol discontinued -VQ scan and Dopplers are negative for VTE/PE -Incentive spirometry, ambulate as tolerated, up in the chair -Holding Lasix today given bump in creatinine   Acute kidney injury on CKD 3B.  Probable progression to CKD 4. Baseline serum creatinine 2.4. -Creatinine now 3.3, likely worsening in the setting of cardiorenal syndrome -Renal ultrasound negative for hydronephrosis -Nephrology input appreciated, will hold diuretics today -Monitor urine output, BMP in a.m., followed by nephrology at [place] and myoclonus Acute metabolic encephalopathy -Likely secondary to have hypercapnia, resolved  -Metabolic work-up was unrevealing including normal ammonia level, normal TSH, normal B12, mildly high free T4.  Normal CRP. -Will send referral for sleep study at discharge  HTN Blood pressure stable. Continue Norvasc and hydralazine.   Type II DM,  uncontrolled with hyperand hypoglycemia, without long-term insulin use with renal complication Hemoglobin 123456 8.2. Currently on sliding scale insulin. Monitor.   Anemia of chronic kidney disease -Stable, monitor   Abnormal-dimer  -I think this is nonspecific, Dopplers and VQ scan are negative   PAD, HLD Continue statin and Zetia.  Stopped Pletal as it is contraindicated in heart failure.    DVT Prophylaxis: enoxaparin (LOVENOX) injection 30 mg Start: 12/02/20 1000 Advance goals of care discussion: Pt is Full code. Family Communication: no family was present at bedside, at the time of interview.    Disposition:  Status is: Inpatient  Remains inpatient appropriate because:IV treatments appropriate due to intensity of illness or inability to take PO and Inpatient level of care appropriate due to severity of illness  Dispo: The patient is from: Home              Anticipated d/c is to: Home              Patient currently is not medically stable to d/c.   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 on the left    Vitals:   12/02/20 0258 12/02/20 0355 12/02/20 0558 12/02/20 0725  BP: (!) 160/60  (!) 146/55 (!) 98/50  Pulse: 77   75  Resp: 20   19  Temp: 99.1 F (37.3 C)   98.6 F (37 C)  TempSrc: Oral   Oral  SpO2: 93%   96%  Weight:  64.5 kg     Scheduled Meds:  amLODipine  10 mg Oral Daily   aspirin EC  81 mg Oral Daily   atorvastatin  40 mg Oral Daily   ezetimibe  10 mg Oral Daily   furosemide  40 mg Oral Daily   hydrALAZINE  50 mg Oral Q8H   insulin aspart  0-15 Units Subcutaneous TID WC   insulin aspart  0-5 Units Subcutaneous QHS   sodium chloride flush  3 mL Intravenous Q12H    Continuous Infusions:  sodium chloride     heparin 600 Units/hr (12/01/20 1435)   PRN Meds: sodium chloride, acetaminophen **OR** acetaminophen, albuterol, insulin aspart, senna-docusate, sodium chloride flush  Body mass index is 26.37 kg/m.    Time spent: 25 minutes.  Domenic Polite MD [facility] 12/02/2020 10:57 AM

## 2020-12-02 NOTE — Progress Notes (Signed)
Physical Therapy Treatment Patient Details Name: Molly [name] MRN: [id number] DOB: October 14, 1950 Today's Date: 12/02/2020    History of Present Illness Molly [name] is a 70 y.o. female who was transferred to [facility] from [facility] ER due to CHF exacerbation and respiratory failure with hypercapnia and hypoxia. [name]. PMH: DMT2, HFpEF, HTN, HLD, PVD, CKD 4    PT Comments    Pt received in recliner. She required min guard assist transfers and ambulation 150' with RW. Mobilized on RA with SpO2 > 90%. Pt in recliner with feet elevated at end of session.   Follow Up Recommendations  Home health PT     Equipment Recommendations  None recommended by PT    Recommendations for Other Services       Precautions / Restrictions Precautions Precautions: Fall;Other (comment) Precaution Comments: watch sats Restrictions Weight Bearing Restrictions: No    Mobility  Bed Mobility               General bed mobility comments: up in recliner    Transfers Overall transfer level: Needs assistance Equipment used: Rolling walker (2 wheeled) Transfers: Sit to/from Stand Sit to Stand: Min guard         General transfer comment: assist for safety, cues for sequencing  Ambulation/Gait Ambulation/Gait assistance: Min guard Gait Distance (Feet): 150 Feet Assistive device: Rolling walker (2 wheeled) Gait Pattern/deviations: Step-through pattern;Decreased stride length Gait velocity: decreased Gait velocity interpretation: <1.31 ft/sec, indicative of household ambulator General Gait Details: mobilized on RA with SpO2 > 90%   Stairs             Wheelchair Mobility    Modified Rankin (Stroke Patients Only)       Balance Overall balance assessment: Needs assistance;History of Falls Sitting-balance support: No upper extremity supported;Feet supported Sitting balance-Leahy Scale: Good     Standing balance support: During  functional activity;No upper extremity supported;Bilateral upper extremity supported Standing balance-Leahy Scale: Poor Standing balance comment: reliant on RW                            Cognition Arousal/Alertness: Awake/alert Behavior During Therapy: WFL for tasks assessed/performed Overall Cognitive Status: No family/caregiver present to determine baseline cognitive functioning                                 General Comments: some slow processing.  and decreased problem solving (vs decreased inititation?)      Exercises      General Comments General comments (skin integrity, edema, etc.): Pt transitioned to RA this AM. SpO2 maintained in 90s during session.      Pertinent Vitals/Pain Pain Assessment: Faces Faces Pain Scale: No hurt Pain Location: L abdomen Pain Descriptors / Indicators: Throbbing Pain Intervention(s): Monitored during session    Home Living                      Prior Function            PT Goals (current goals can now be found in the care plan section) Acute Rehab PT Goals Patient Stated Goal: home Progress towards PT goals: Progressing toward goals    Frequency    Min 3X/week      PT Plan Current plan remains appropriate    Co-evaluation  AM-PAC PT "6 Clicks" Mobility   Outcome Measure  Help needed turning from your back to your side while in a flat bed without using bedrails?: A Little Help needed moving from lying on your back to sitting on the side of a flat bed without using bedrails?: A Little Help needed moving to and from a bed to a chair (including a wheelchair)?: A Little Help needed standing up from a chair using your arms (e.g., wheelchair or bedside chair)?: A Little Help needed to walk in hospital room?: A Little Help needed climbing 3-5 steps with a railing? : A Little 6 Click Score: 18    End of Session Equipment Utilized During Treatment: Gait belt Activity  Tolerance: Patient tolerated treatment well Patient left: in chair;with call bell/phone within reach Nurse Communication: Mobility status PT Visit Diagnosis: Unsteadiness on feet (R26.81);Other abnormalities of gait and mobility (R26.89);Muscle weakness (generalized) (M62.81);Difficulty in walking, not elsewhere classified (R26.2)     Time: 1031-1100 PT Time Calculation (min) (ACUTE ONLY): 29 min  Charges:  $Gait Training: 23-37 mins                     Lorrin Goodell, [place]  Office # [phone] Pager [phone]    Lorriane Shire 12/02/2020, 11:58 AM

## 2020-12-02 NOTE — Progress Notes (Signed)
Patient refused CPAP for the night. Vitals stable.

## 2020-12-02 NOTE — Progress Notes (Signed)
Occupational Therapy Treatment Patient Details Name: Molly [name] MRN: [id number] DOB: April 05, 1951 Today's Date: 12/02/2020    History of present illness Molly [name] is a 70 y.o. female who was transferred to [facility] from [facility] ER due to CHF exacerbation and respiratory failure with hypercapnia and hypoxia. [name]. PMH: DMT2, HFpEF, HTN, HLD, PVD, CKD 4   OT comments  Pt progressing towards acute OT goals. Pt seated in recliner with tray in front setup for bathing. Pt completed bathing tasks during OT session. Needed cueing to inititate each tasks "I started but now I don't know what to do next." On RA throughout session with SpO2 dipping to 88 with activity. Educated pt on breathing exercises. D/c plan remains appropriate.    Follow Up Recommendations  Home health OT;Supervision - Intermittent    Equipment Recommendations  None recommended by OT    Recommendations for Other Services      Precautions / Restrictions Precautions Precautions: Fall;Other (comment) Precaution Comments: watch sats Restrictions Weight Bearing Restrictions: No       Mobility Bed Mobility               General bed mobility comments: up in recliner    Transfers Overall transfer level: Needs assistance Equipment used: Rolling walker (2 wheeled) Transfers: Sit to/from Stand Sit to Stand: Min guard         General transfer comment: assist for safety, cues for sequencing    Balance Overall balance assessment: Needs assistance;History of Falls Sitting-balance support: No upper extremity supported;Feet supported Sitting balance-Leahy Scale: Good     Standing balance support: During functional activity;No upper extremity supported;Bilateral upper extremity supported Standing balance-Leahy Scale: Poor Standing balance comment: reliant on RW                           ADL either performed or assessed with clinical judgement    ADL Overall ADL's : Needs assistance/impaired         Upper Body Bathing: Set up;Sitting   Lower Body Bathing: Minimal assistance;Sit to/from stand                         General ADL Comments: Pt finished up bathing tasks during OT session. Needed cueing to inititate each tasks "I started but now I don't know what to do next." On RA throughout session with SpO2 dipping to 88 with activity. Educated pt on breathing exercises.     Vision       Perception     Praxis      Cognition Arousal/Alertness: Awake/alert Behavior During Therapy: WFL for tasks assessed/performed;Flat affect Overall Cognitive Status: No family/caregiver present to determine baseline cognitive functioning                                 General Comments: Slow processing and decreased problem solving (vs decreased inititation?)        Exercises     Shoulder Instructions       General Comments Pt transitioned to RA this AM. SpO2 maintained in 90s during session.    Pertinent Vitals/ Pain       Pain Assessment: Faces Faces Pain Scale: Hurts little more Pain Location: L abdomen "I think I may need to go to the bathroom soon." Pain Descriptors / Indicators: Throbbing Pain Intervention(s):  Monitored during session  Home Living                                          Prior Functioning/Environment              Frequency  Min 2X/week        Progress Toward Goals  OT Goals(current goals can now be found in the care plan section)  Progress towards OT goals: Progressing toward goals  Acute Rehab OT Goals Patient Stated Goal: home OT Goal Formulation: With patient Time For Goal Achievement: 12/12/20 Potential to Achieve Goals: Good ADL Goals Pt Will Perform Grooming: with modified independence;standing Pt Will Perform Lower Body Bathing: with modified independence;sit to/from stand Pt Will Transfer to Toilet: with modified  independence;ambulating Pt/caregiver will Perform Home Exercise Program: Increased strength;Both right and left upper extremity;With theraband;Independently;With written HEP provided Additional ADL Goal #1: Pt to verbalize at least 2 energy conservation strategies to implement during ADLs/mobility at home  Plan Discharge plan remains appropriate    Co-evaluation                 AM-PAC OT "6 Clicks" Daily Activity     Outcome Measure   Help from another person eating meals?: None Help from another person taking care of personal grooming?: A Little Help from another person toileting, which includes using toliet, bedpan, or urinal?: A Little Help from another person bathing (including washing, rinsing, drying)?: A Little Help from another person to put on and taking off regular upper body clothing?: A Little Help from another person to put on and taking off regular lower body clothing?: A Little 6 Click Score: 19    End of Session Equipment Utilized During Treatment: Rolling walker  OT Visit Diagnosis: Unsteadiness on feet (R26.81);Other abnormalities of gait and mobility (R26.89);Muscle weakness (generalized) (M62.81)   Activity Tolerance Patient tolerated treatment well   Patient Left in chair;with call bell/phone within reach;Other (comment) (with PT)   Nurse Communication          [id number] OT Time Calculation (min): 16 min  Charges: OT General Charges $OT Visit: 1 Visit OT Treatments $Self Care/Home Management : 8-22 mins  Tyrone Schimke, OT Acute Rehabilitation Services Pager: [phone] Office: [phone]    Hortencia Pilar 12/02/2020, 2:00 PM

## 2020-12-02 NOTE — Progress Notes (Signed)
Patient refused CPAP for the night.  Patient VS stable.  RT will continue to monitor.

## 2020-12-02 NOTE — Plan of Care (Signed)
  Problem: Education: Goal: Knowledge of General Education information will improve Description Including pain rating scale, medication(s)/side effects and non-pharmacologic comfort measures Outcome: Progressing   

## 2020-12-03 ENCOUNTER — Telehealth: Payer: Self-pay | Admitting: Pulmonary Disease

## 2020-12-03 LAB — RENAL FUNCTION PANEL
Albumin: 2.9 g/dL — ABNORMAL LOW (ref 3.5–5.0)
Anion gap: 12 (ref 5–15)
BUN: 38 mg/dL — ABNORMAL HIGH (ref 8–23)
CO2: 34 mmol/L — ABNORMAL HIGH (ref 22–32)
Calcium: 8.2 mg/dL — ABNORMAL LOW (ref 8.9–10.3)
Chloride: 91 mmol/L — ABNORMAL LOW (ref 98–111)
Creatinine, Ser: 3.35 mg/dL — ABNORMAL HIGH (ref 0.44–1.00)
GFR, Estimated: 14 mL/min — ABNORMAL LOW (ref >60)
Glucose, Bld: 140 mg/dL — ABNORMAL HIGH (ref 70–99)
Phosphorus: 3.2 mg/dL (ref 2.5–4.6)
Potassium: 3.8 mmol/L (ref 3.5–5.1)
Sodium: 137 mmol/L (ref 135–145)

## 2020-12-03 LAB — CBC
HCT: 37.3 % (ref 36.0–46.0)
Hemoglobin: 11.3 g/dL — ABNORMAL LOW (ref 12.0–15.0)
MCH: 26.8 pg (ref 26.0–34.0)
MCHC: 30.3 g/dL (ref 30.0–36.0)
MCV: 88.6 fL (ref 80.0–100.0)
Platelets: 207 10*3/uL (ref 150–400)
RBC: 4.21 MIL/uL (ref 3.87–5.11)
RDW: 15.2 % (ref 11.5–15.5)
WBC: 7.6 10*3/uL (ref 4.0–10.5)
nRBC: 0 % (ref 0.0–0.2)

## 2020-12-03 LAB — GLUCOSE, CAPILLARY: Glucose-Capillary: 121 mg/dL — ABNORMAL HIGH (ref 70–99)

## 2020-12-03 MED ORDER — INSULIN ISOPHANE & REGULAR (HUMAN 70-30)100 UNIT/ML KWIKPEN: 8.0000 [IU] | PEN_INJECTOR | Freq: Every day | SUBCUTANEOUS | Status: DC

## 2020-12-03 MED ORDER — FUROSEMIDE 40 MG PO TABS: 40.0000 mg | ORAL_TABLET | Freq: Every day | ORAL | 0 refills | Status: DC

## 2020-12-03 MED ORDER — AMLODIPINE BESYLATE 10 MG PO TABS: 10.0000 mg | ORAL_TABLET | Freq: Every day | ORAL | 0 refills | Status: DC

## 2020-12-03 NOTE — Telephone Encounter (Signed)
Patient needs a sleep study before her 9/29 appt w/ [facility] if possible. Her # is [phone]

## 2020-12-03 NOTE — Telephone Encounter (Signed)
There is no order for any kind of sleep study

## 2020-12-03 NOTE — Discharge Summary (Signed)
Physician Discharge Summary  Molly [name] [id number] DOB: 06-11-1950 DOA: 11/26/2020  PCP: [name], MD  Admit date: 11/26/2020 Discharge date: 12/03/2020  Time spent: 35 minutes  Recommendations for Outpatient Follow-up:  Nephrology Dr.Nwobu in 1 to 2 weeks Pulmonary Dr. Silas Flood in 2 weeks, needs a sleep study Cardiology follow-up in 1 month   Discharge Diagnoses:  Principal Problem:   Acute on chronic heart failure with preserved ejection fraction (HFpEF) (HCC) Active Problems:   HTN (hypertension)   Type 2 diabetes mellitus with stage 4 chronic kidney disease, with long-term current use of insulin (HCC)   Acute on chronic respiratory failure with hypoxia and hypercapnia (HCC)   Stage 4 chronic kidney disease (HCC)   Elevated d-dimer   Prolonged QT interval   Discharge Condition: Stable  Diet recommendation: Low-sodium, diabetic, heart healthy  Filed Weights   12/02/20 0355 12/03/20 0442  Weight: 64.5 kg 63.8 kg    History of present illness:  70 year old female with history significant for DMT2, HFpEF, HTN, HLD, PVD, CKD 4 who was transferred to [facility] from [facility] ER due to CHF exacerbation and respiratory failure with hypercapnia and hypoxia.  Found to have acute hypoxic and hypercapnic respiratory failure as well as acute on chronic diastolic CHF.  Hospital Course:   Acute hypoxic and hypercapnic respiratory failure Acute on chronic diastolic CHF -Respiratory status improved -briefly required BiPAP on admission now off -Improved with diuresis, she is -4.4 L, was then switched to oral diuretics -Appreciate cardiology and nephrology input, 2D echo noted EF of 60-65% without wall motion abnormalities, carvedilol discontinued -VQ scan and Dopplers are negative for VTE/PE -clinically improving, weaned off O2, transition to Lasix 40 mg daily at discharge. -Needs close follow-up with nephrology and cardiology    Acute kidney injury on CKD  3B.  Probable progression to CKD 4. Baseline serum creatinine 2.4. -Creatinine now 3.3, likely worsening in the setting of cardiorenal syndrome -Renal ultrasound negative for hydronephrosis -Nephrology input appreciated,  -Stable and euvolemic now, discharged home on Lasix 40 mg daily, recommended close follow-up with patient's nephrologist Dr.Nwobu with [facility]   Asterixis and myoclonus Acute metabolic encephalopathy -Likely secondary to have hypercapnia, resolved  -Metabolic work-up was unrevealing including normal ammonia level, normal TSH, normal B12, mildly high free T4.  Normal CRP. -Follow-up with pulmonary for sleep study after discharge   HTN Blood pressure stable. Continue Norvasc and hydralazine.   Type II DM, uncontrolled with hyperand hypoglycemia, without long-term insulin use with renal complication Hemoglobin 123456 8.2. -Insulin resumed at lower dose on account of progressive kidney disease   Anemia of chronic kidney disease -Stable, monitor   Abnormal-dimer  -I think this is nonspecific, Dopplers and VQ scan are negative    PAD, HLD Continue Pletal, statin and Zetia   Discharge Exam: Vitals:   12/03/20 0349 12/03/20 0727  BP: (!) 134/56 (!) 141/61  Pulse: 76 73  Resp: 15 18  Temp: 98.7 F (37.1 C) 98.8 F (37.1 C)  SpO2: 93% 96%    General: AAOx3 Cardiovascular: S1-S2, regular rate rhythm Respiratory: Clear  Discharge Instructions   Discharge Instructions     Diet - low sodium heart healthy   Complete by: As directed    Diet Carb Modified   Complete by: As directed    Increase activity slowly   Complete by: As directed       Allergies as of 12/03/2020       Reactions   Lisinopril Other (See Comments)  Mercury Nausea And Vomiting        Medication List     STOP taking these medications    carvedilol 25 MG tablet Commonly known as: COREG   dicyclomine 10 MG capsule Commonly known as: BENTYL   diphenhydrAMINE 25 MG  tablet Commonly known as: BENADRYL       TAKE these medications    amLODipine 10 MG tablet Commonly known as: NORVASC Take 1 tablet (10 mg total) by mouth daily.   Cholecalciferol 25 MCG (1000 UT) tablet Take 2,000 Units by mouth daily.   cilostazol 50 MG tablet Commonly known as: PLETAL Take 1 tablet (50 mg total) by mouth 2 (two) times daily.   ezetimibe 10 MG tablet Commonly known as: ZETIA Take 1 tablet (10 mg total) by mouth daily.   famotidine 20 MG tablet Commonly known as: PEPCID Take 20 mg by mouth in the morning and at bedtime.   Flintstones Complete 18 MG Chew Chew 1 tablet by mouth daily. Unknown strength   furosemide 40 MG tablet Commonly known as: Lasix Take 1 tablet (40 mg total) by mouth daily.   GENTEAL OP Place 1 drop into both eyes daily as needed (dry eyes).   hydrALAZINE 25 MG tablet Commonly known as: APRESOLINE Take 1 tablet (25 mg total) by mouth every 12 (twelve) hours.   insulin isophane & regular human KwikPen (70-30) 100 UNIT/ML KwikPen Commonly known as: HUMULIN 70/30 MIX Inject 8-12 Units into the skin daily. What changed: how much to take   ketoconazole 2 % cream Commonly known as: NIZORAL Apply 1 application topically daily as needed for irritation.   levocetirizine 5 MG tablet Commonly known as: XYZAL Take 5 mg by mouth daily as needed for allergies.   loperamide 2 MG tablet Commonly known as: IMODIUM A-D Take 2 mg by mouth 4 (four) times daily as needed for diarrhea or loose stools.   rosuvastatin 40 MG tablet Commonly known as: CRESTOR TAKE 1 TABLET(40 MG) BY MOUTH AT BEDTIME What changed: See the new instructions.   triamcinolone cream 0.1 % Commonly known as: KENALOG Apply 1 application topically daily as needed (spots on legs).               Durable Medical Equipment  (From admission, onward)           Start     Ordered   11/30/20 1039  For home use only DME continuous positive airway pressure  (CPAP)  Once       Question Answer Comment  Length of Need Lifetime   Patient has OSA or probable OSA Yes   Is the patient currently using CPAP in the home No   Settings Autotitration   Signs and symptoms of probable OSA  (select all that apply) Snoring   Signs and symptoms of probable OSA  (select all that apply) Moring headaches   CPAP supplies needed Mask, headgear, cushions, filters, heated tubing and water chamber      11/30/20 1039           Allergies  Allergen Reactions   Lisinopril Other (See Comments)   Mercury Nausea And Vomiting      The results of significant diagnostics from this hospitalization (including imaging, microbiology, ancillary and laboratory) are listed below for reference.    Significant Diagnostic Studies: US RENAL  Result Date: 11/27/2020 CLINICAL DATA:  Chronic renal failure EXAM: RENAL / URINARY TRACT ULTRASOUND COMPLETE COMPARISON:  09/04/2020 FINDINGS: Right Kidney: Renal measurements: 10.3 x 4.3  x 4.8 cm. = volume: 108 mL. Mild extrarenal pelvis is noted. No obstructive changes are seen. Left Kidney: Renal measurements: 8.7 x 4.3 x 4.3 cm. = volume: 83 mL. Echogenicity within normal limits. No mass or hydronephrosis visualized. Bladder: Appears normal for degree of bladder distention. Other: None. IMPRESSION: No acute abnormality noted. Electronically Signed   By: Inez Catalina M.D.   On: 11/27/2020 22:13   DG Chest Port 1 View  Result Date: 11/26/2020 CLINICAL DATA:  SOB (shortness of breath) R06.02 (ICD-10-CM) EXAM: PORTABLE CHEST 1 VIEW COMPARISON:  October 08, 2020. FINDINGS: Low lung volumes with increased left greater than right basilar opacities. No visible pleural effusions or pneumothorax. Cardiomediastinal silhouette is mildly enlarged, likely accentuated by low lung volumes and AP technique. No acute osseous abnormality. IMPRESSION: Low lung volumes with increased left greater than right basilar opacities, suspicious for pneumonia.  Electronically Signed   By: Margaretha Sheffield M.D.   On: 11/26/2020 13:42   ECHOCARDIOGRAM COMPLETE  Result Date: 11/27/2020    ECHOCARDIOGRAM REPORT   Patient Name:   IRISA LECLEAR Date of Exam: 11/27/2020 Medical Rec #:  [id number]    Height:       62.0 in Accession #:    [id number]   Weight:       162.0 lb Date of Birth:  12-Dec-1950    BSA:          1.748 m Patient Age:    43 years     BP:           148/78 mmHg Patient Gender: F            HR:           51 bpm. Exam Location:  Inpatient Procedure: 2D Echo, Cardiac Doppler and Color Doppler Indications:    CHF  History:        Patient has prior history of Echocardiogram examinations, most                 recent 08/31/2020. CHF, PVD, Signs/Symptoms:Shortness of Breath                 and Resp. failure, CKD; Risk Factors:Diabetes, Hypertension,                 Dyslipidemia and Obesity.  Sonographer:    Dustin Flock RDCS Referring Phys: [id number] [place]  1. Left ventricular ejection fraction, by estimation, is 60 to 65%. The left ventricle has normal function. The left ventricle has no regional wall motion abnormalities. There is mild left ventricular hypertrophy. Left ventricular diastolic parameters are consistent with Grade II diastolic dysfunction (pseudonormalization). Elevated left atrial pressure.  2. Right ventricular systolic function is normal. The right ventricular size is normal. There is normal pulmonary artery systolic pressure.  3. A small pericardial effusion is present.  4. The mitral valve is normal in structure. No evidence of mitral valve regurgitation. No evidence of mitral stenosis.  5. The aortic valve is tricuspid. Aortic valve regurgitation is not visualized. Mild aortic valve sclerosis is present, with no evidence of aortic valve stenosis.  6. The inferior vena cava is normal in size with greater than 50% respiratory variability, suggesting right atrial pressure of 3 mmHg. FINDINGS  Left Ventricle: Left ventricular  ejection fraction, by estimation, is 60 to 65%. The left ventricle has normal function. The left ventricle has no regional wall motion abnormalities. The left ventricular internal cavity size was normal in size. There is  mild left ventricular hypertrophy. Left ventricular diastolic parameters are consistent with Grade II diastolic dysfunction (pseudonormalization). Elevated left atrial pressure. Right Ventricle: The right ventricular size is normal. Right ventricular systolic function is normal. There is normal pulmonary artery systolic pressure. The tricuspid regurgitant velocity is 2.70 m/s, and with an assumed right atrial pressure of 3 mmHg,  the estimated right ventricular systolic pressure is [id number] mmHg. Left Atrium: Left atrial size was normal in size. Right Atrium: Right atrial size was normal in size. Pericardium: A small pericardial effusion is present. Mitral Valve: The mitral valve is normal in structure. Mild mitral annular calcification. No evidence of mitral valve regurgitation. No evidence of mitral valve stenosis. Tricuspid Valve: The tricuspid valve is normal in structure. Tricuspid valve regurgitation is mild . No evidence of tricuspid stenosis. Aortic Valve: The aortic valve is tricuspid. Aortic valve regurgitation is not visualized. Mild aortic valve sclerosis is present, with no evidence of aortic valve stenosis. Pulmonic Valve: The pulmonic valve was not well visualized. Pulmonic valve regurgitation is trivial. No evidence of pulmonic stenosis. Aorta: The aortic root is normal in size and structure. Venous: The inferior vena cava is normal in size with greater than 50% respiratory variability, suggesting right atrial pressure of 3 mmHg. IAS/Shunts: No atrial level shunt detected by color flow Doppler.  LEFT VENTRICLE PLAX 2D LVIDd:         3.70 cm  Diastology LVIDs:         2.70 cm  LV e' medial:    3.92 cm/s LV PW:         1.20 cm  LV E/e' medial:  23.8 LV IVS:        1.40 cm  LV e' lateral:    3.81 cm/s LVOT diam:     1.90 cm  LV E/e' lateral: 24.5 LV SV:         60 LV SV Index:   34 LVOT Area:     2.84 cm  RIGHT VENTRICLE RV Basal diam:  2.90 cm RV S prime:     7.72 cm/s TAPSE (M-mode): 1.8 cm LEFT ATRIUM           Index       RIGHT ATRIUM           Index LA diam:      4.30 cm 2.46 cm/m  RA Area:     15.90 cm LA Vol (A2C): 29.9 ml 17.10 ml/m RA Volume:   38.30 ml  21.91 ml/m LA Vol (A4C): 57.0 ml 32.61 ml/m  AORTIC VALVE LVOT Vmax:   84.90 cm/s LVOT Vmean:  53.800 cm/s LVOT VTI:    0.211 m  AORTA Ao Root diam: 3.10 cm MITRAL VALVE               TRICUSPID VALVE MV Area (PHT): 2.72 cm    TR Peak grad:   29.2 mmHg MV Decel Time: 279 msec    TR Vmax:        270.00 cm/s MV E velocity: 93.40 cm/s MV A velocity: 92.10 cm/s  SHUNTS MV E/A ratio:  1.01        Systemic VTI:  0.21 m                            Systemic Diam: 1.90 cm Kirk Ruths MD Electronically signed by Kirk Ruths MD Signature Date/Time: 11/27/2020/1:14:38 PM    Final    VAS [place] LOWER EXTREMITY  VENOUS (DVT)  Result Date: 11/30/2020  Lower Venous DVT Study Patient Name:  PETRICE NAVIN  Date of Exam:   11/30/2020 Medical Rec #: [id number]     Accession #:    [id number] Date of Birth: [birth year]     Patient Gender: F Patient Age:   32 years Exam Location:  [facility] Procedure:      VAS [place] LOWER EXTREMITY VENOUS (DVT) Referring Phys: PRANAV PATEL --------------------------------------------------------------------------------  Indications: Edema.  Risk Factors: None identified. Comparison Study: No prior studies. Performing Technologist: Oliver Hum RVT  Examination Guidelines: A complete evaluation includes B-mode imaging, spectral Doppler, color Doppler, and power Doppler as needed of all accessible portions of each vessel. Bilateral testing is considered an integral part of a complete examination. Limited examinations for reoccurring indications may be performed as noted. The reflux portion of the exam is performed with  the patient in reverse Trendelenburg.  +---------+---------------+---------+-----------+----------+--------------+ RIGHT    CompressibilityPhasicitySpontaneityPropertiesThrombus Aging +---------+---------------+---------+-----------+----------+--------------+ CFV      Full           Yes      Yes                                 +---------+---------------+---------+-----------+----------+--------------+ SFJ      Full                                                        +---------+---------------+---------+-----------+----------+--------------+ FV Prox  Full                                                        +---------+---------------+---------+-----------+----------+--------------+ FV Mid   Full                                                        +---------+---------------+---------+-----------+----------+--------------+ FV DistalFull                                                        +---------+---------------+---------+-----------+----------+--------------+ PFV      Full                                                        +---------+---------------+---------+-----------+----------+--------------+ POP      Full           Yes      Yes                                 +---------+---------------+---------+-----------+----------+--------------+ PTV      Full                                                        +---------+---------------+---------+-----------+----------+--------------+  PERO     Full                                                        +---------+---------------+---------+-----------+----------+--------------+   +---------+---------------+---------+-----------+----------+--------------+ LEFT     CompressibilityPhasicitySpontaneityPropertiesThrombus Aging +---------+---------------+---------+-----------+----------+--------------+ CFV      Full           Yes      Yes                                  +---------+---------------+---------+-----------+----------+--------------+ SFJ      Full                                                        +---------+---------------+---------+-----------+----------+--------------+ FV Prox  Full                                                        +---------+---------------+---------+-----------+----------+--------------+ FV Mid   Full                                                        +---------+---------------+---------+-----------+----------+--------------+ FV DistalFull                                                        +---------+---------------+---------+-----------+----------+--------------+ PFV      Full                                                        +---------+---------------+---------+-----------+----------+--------------+ POP      Full           Yes      Yes                                 +---------+---------------+---------+-----------+----------+--------------+ PTV      Full                                                        +---------+---------------+---------+-----------+----------+--------------+ PERO     Full                                                        +---------+---------------+---------+-----------+----------+--------------+  Summary: RIGHT: - There is no evidence of deep vein thrombosis in the lower extremity.  - No cystic structure found in the popliteal fossa.  LEFT: - There is no evidence of deep vein thrombosis in the lower extremity.  - No cystic structure found in the popliteal fossa.  *See table(s) above for measurements and observations. Electronically signed by Deitra Mayo MD on 11/30/2020 at 4:44:14 PM.    Final     Microbiology: Recent Results (from the past 240 hour(s))  Resp Panel by RT-PCR (Flu A&B, Covid) Nasopharyngeal Swab     Status: None   Collection Time: 11/26/20 12:26 PM   Specimen: Nasopharyngeal Swab; Nasopharyngeal(NP) swabs in  vial transport medium  Result Value Ref Range Status   SARS Coronavirus 2 by RT PCR NEGATIVE NEGATIVE Final    Comment: (NOTE) SARS-CoV-2 target nucleic acids are NOT DETECTED.  The SARS-CoV-2 RNA is generally detectable in upper respiratory specimens during the acute phase of infection. The lowest concentration of SARS-CoV-2 viral copies this assay can detect is 138 copies/mL. A negative result does not preclude SARS-Cov-2 infection and should not be used as the sole basis for treatment or other patient management decisions. A negative result may occur with  improper specimen collection/handling, submission of specimen other than nasopharyngeal swab, presence of viral mutation(s) within the areas targeted by this assay, and inadequate number of viral copies(<138 copies/mL). A negative result must be combined with clinical observations, patient history, and epidemiological information. The expected result is Negative.  Fact Sheet for Patients:  EntrepreneurPulse.com.au  Fact Sheet for Healthcare Providers:  IncredibleEmployment.be  This test is no t yet approved or cleared by the [place] FDA and  has been authorized for detection and/or diagnosis of SARS-CoV-2 by FDA under an Emergency Use Authorization (EUA). This EUA will remain  in effect (meaning this test can be used) for the duration of the COVID-19 declaration under Section 564(b)(1) of the Act, 21 U.S.C.section 360bbb-3(b)(1), unless the authorization is terminated  or revoked sooner.       Influenza A by PCR NEGATIVE NEGATIVE Final   Influenza B by PCR NEGATIVE NEGATIVE Final    Comment: (NOTE) The Xpert Xpress SARS-CoV-2/FLU/RSV plus assay is intended as an aid in the diagnosis of influenza from Nasopharyngeal swab specimens and should not be used as a sole basis for treatment. Nasal washings and aspirates are unacceptable for Xpert Xpress SARS-CoV-2/FLU/RSV testing.  Fact  Sheet for Patients: EntrepreneurPulse.com.au  Fact Sheet for Healthcare Providers: IncredibleEmployment.be  This test is not yet approved or cleared by the [place] FDA and has been authorized for detection and/or diagnosis of SARS-CoV-2 by FDA under an Emergency Use Authorization (EUA). This EUA will remain in effect (meaning this test can be used) for the duration of the COVID-19 declaration under Section 564(b)(1) of the Act, 21 U.S.C. section 360bbb-3(b)(1), unless the authorization is terminated or revoked.  Performed at [facility], [address] [place], [place] [address]   MRSA Next Gen by PCR, Nasal     Status: None   Collection Time: 11/26/20 11:14 PM   Specimen: Nasal Mucosa; Nasal Swab  Result Value Ref Range Status   MRSA by PCR Next Gen NOT DETECTED NOT DETECTED Final    Comment: (NOTE) The GeneXpert MRSA Assay (FDA approved for NASAL specimens only), is one component of a comprehensive MRSA colonization surveillance program. It is not intended to diagnose MRSA infection nor to guide or monitor treatment for MRSA infections. Test  performance is not FDA approved in patients less than 46 years old. Performed at [facility], [place] [address], [place], [place] [address]      Labs: Basic Metabolic Panel: Recent Labs  Lab 11/29/20 1210 11/30/20 0147 12/01/20 0257 12/02/20 0125 12/03/20 0107  NA 141 142 140 139 137  K 3.3* 3.2* 3.5 4.2 3.8  CL 93* 95* 92* 90* 91*  CO2 37* 36* 39* 37* 34*  GLUCOSE 132* 128* 157* 138* 140*  BUN 35* 33* 35* 35* 38*  CREATININE 2.73* 2.96* 2.94* 3.38* 3.35*  CALCIUM 8.0* 8.4* 8.7* 8.8* 8.2*  MG 2.0  --   --   --   --   PHOS  --  3.9 2.6 2.3* 3.2   Liver Function Tests: Recent Labs  Lab 11/26/20 1226 11/29/20 1210 11/30/20 0147 12/01/20 0257 12/02/20 0125 12/03/20 0107  AST 59* 41  --   --   --   --   ALT 80* 46*  --   --   --   --   ALKPHOS 66 55  --   --    --   --   BILITOT 0.5 1.2  --   --   --   --   PROT 7.6 6.1*  --   --   --   --   ALBUMIN 4.1 3.0* 3.2* 3.3* 3.0* 2.9*   No results for input(s): LIPASE, AMYLASE in the last 168 hours. Recent Labs  Lab 11/29/20 1246  AMMONIA 19   CBC: Recent Labs  Lab 11/26/20 1226 11/26/20 1303 11/29/20 0041 11/30/20 0147 12/01/20 0257 12/02/20 0125 12/03/20 0107  WBC 5.2   < > 5.0 5.8 6.7 7.5 7.6  NEUTROABS 3.2  --   --   --   --   --   --   HGB 11.7*   < > 10.6* 11.6* 12.3 12.2 11.3*  HCT 39.1   < > 35.2* 38.6 40.7 39.8 37.3  MCV 92.2   < > 91.4 91.5 90.0 90.0 88.6  PLT 228   < > 200 206 215 211 207   < > = values in this interval not displayed.   Cardiac Enzymes: No results for input(s): CKTOTAL, CKMB, CKMBINDEX, TROPONINI in the last 168 hours. BNP: BNP (last 3 results) Recent Labs    09/05/20 0619 09/11/20 1117 11/26/20 1226  BNP 1,047.3* 110.3* 1,695.6*    ProBNP (last 3 results) No results for input(s): PROBNP in the last 8760 hours.  CBG: Recent Labs  Lab 12/02/20 0626 12/02/20 1111 12/02/20 1658 12/02/20 2104 12/03/20 0616  GLUCAP 162* 127* 109* 125* 121*       Signed:  Domenic Polite MD.  [facility] 12/03/2020, 10:47 AM

## 2020-12-03 NOTE — TOC Transition Note (Addendum)
Transition of Care [facility]) - CM/SW Discharge Note   Patient Details  Name: Molly [name] MRN: [id number] Date of Birth: 02-04-1951  [facility] CM/SW Contact:  Verdell Carmine, RN Phone Number: 12/03/2020, 11:49 AM   Clinical Narrative:     [facility] called make aware of DC, CPAP ordered from [facility] they will set up with the patient. Reordered [facility]  1230 sspoke with [facility] about CPAP, sleep study needed prior to CPAP. Called [facility] to schedule sleep study, preferable prior to September appointment that she has with them. They will call her to schedule, but state that sleep studies are booked up.   Final next level of care: [place] Barriers to Discharge: No Barriers Identified   Patient Goals and CMS Choice        Discharge Placement               Home with [facility]        Discharge Plan and Services                DME Arranged: Continuous positive airway pressure (CPAP) DME Agency: [facility] Date DME Agency Contacted: 12/03/20 Time DME Agency Contacted: 1140 Representative spoke with at DME Agency: [place]: PT, OT [place] Agency: [facility] Date [place]: 12/03/20 Time [place]: 1115 Representative spoke with at [place]: [place] ([place]) Interventions     Readmission Risk Interventions No flowsheet data found.

## 2020-12-03 NOTE — Progress Notes (Signed)
Physical Therapy Treatment Patient Details Name: Molly [name] MRN: [id number] DOB: December 29, 1950 Today's Date: 12/03/2020    History of Present Illness Molly [name] is a 70 y.o. female admitted 11/26/20 with CHF exacerbation and respiratory failure with hypercapnia and hypoxia. [name]. PMH includes DMT2, HFpEF, HTN, HLD, PVD, CKD 4.   PT Comments    Pt progressing well with mobility. Today's session focused on transfer and gait training without DME, as pt reports she does not plan to use RW at home. Pt preparing for d/c today; husband present and supportive; report no further questions or concerns.     Follow Up Recommendations  No PT follow up;Supervision - Intermittent     Equipment Recommendations  None recommended by PT    Recommendations for Other Services       Precautions / Restrictions Precautions Precautions: Fall Restrictions Weight Bearing Restrictions: No    Mobility  Bed Mobility Overal bed mobility: Modified Independent                  Transfers Overall transfer level: Needs assistance Equipment used: None Transfers: Sit to/from Stand Sit to Stand: Supervision            Ambulation/Gait Ambulation/Gait assistance: Supervision Gait Distance (Feet): 200 Feet Assistive device: None Gait Pattern/deviations: Step-through pattern;Decreased stride length Gait velocity: Decreased   General Gait Details: Slow, steady gait without DME, supervision for safety/balance; VSS on RA   Stairs Stairs:  (pt declined; will have husband nearby on 2 steps into house)           Wheelchair Mobility    Modified Rankin (Stroke Patients Only)       Balance Overall balance assessment: Needs assistance;History of Falls   Sitting balance-Leahy Scale: Good     Standing balance support: During functional activity;No upper extremity supported Standing balance-Leahy Scale: Good                               Cognition Arousal/Alertness: Awake/alert Behavior During Therapy: WFL for tasks assessed/performed;Flat affect Overall Cognitive Status: Within Functional Limits for tasks assessed                                 General Comments: WFL for simple tasks; some decreased memory and slowed processing (?); suspect near baseline cognition      Exercises      General Comments General comments (skin integrity, edema, etc.): Pt's husband present and supportive; no longer feel pt needs follow-up with HHPT services, pt in agreement; pt and husband report no further questions or concerns      Pertinent Vitals/Pain Pain Assessment: No/denies pain Pain Intervention(s): Monitored during session    Home Living                      Prior Function            PT Goals (current goals can now be found in the care plan section) Progress towards PT goals: Progressing toward goals    Frequency    Min 3X/week      PT Plan Discharge plan needs to be updated    Co-evaluation              AM-PAC PT "6 Clicks" Mobility   Outcome Measure  Help needed turning from your back to  your side while in a flat bed without using bedrails?: None Help needed moving from lying on your back to sitting on the side of a flat bed without using bedrails?: None Help needed moving to and from a bed to a chair (including a wheelchair)?: None Help needed standing up from a chair using your arms (e.g., wheelchair or bedside chair)?: None Help needed to walk in hospital room?: A Little Help needed climbing 3-5 steps with a railing? : A Little 6 Click Score: 22    End of Session   Activity Tolerance: Patient tolerated treatment well Patient left: in bed;with call bell/phone within reach;with family/visitor present Nurse Communication: Mobility status PT Visit Diagnosis: Unsteadiness on feet (R26.81);Other abnormalities of gait and mobility (R26.89);Muscle weakness (generalized)  (M62.81);Difficulty in walking, not elsewhere classified (R26.2)     Time: [id number] PT Time Calculation (min) (ACUTE ONLY): 17 min  Charges:  $Gait Training: 8-22 mins                     Mabeline Caras, PT, DPT Acute Rehabilitation Services  Pager [phone] Office [place] 12/03/2020, 1:10 PM

## 2020-12-03 NOTE — Telephone Encounter (Signed)
In order for insurance to pay for cpap pt will have to have a sleep study done so there will have to be an order placed Joellen [place]

## 2020-12-07 ENCOUNTER — Other Ambulatory Visit: Payer: Self-pay | Admitting: Physician Assistant

## 2020-12-07 ENCOUNTER — Other Ambulatory Visit: Payer: Self-pay

## 2020-12-07 ENCOUNTER — Ambulatory Visit
Admission: RE | Admit: 2020-12-07 | Discharge: 2020-12-07 | Disposition: A | Discharge status: Home / Self Care | Payer: Medicare Other | Source: Ambulatory Visit | Attending: Physician Assistant | Admitting: Physician Assistant

## 2020-12-07 DIAGNOSIS — I10 Essential (primary) hypertension: Secondary | ICD-10-CM | POA: Diagnosis not present

## 2020-12-07 DIAGNOSIS — E1165 Type 2 diabetes mellitus with hyperglycemia: Secondary | ICD-10-CM | POA: Diagnosis not present

## 2020-12-07 DIAGNOSIS — I5032 Chronic diastolic (congestive) heart failure: Secondary | ICD-10-CM | POA: Diagnosis not present

## 2020-12-07 DIAGNOSIS — N184 Chronic kidney disease, stage 4 (severe): Secondary | ICD-10-CM | POA: Diagnosis not present

## 2020-12-07 DIAGNOSIS — R9389 Abnormal findings on diagnostic imaging of other specified body structures: Secondary | ICD-10-CM

## 2020-12-07 DIAGNOSIS — R918 Other nonspecific abnormal finding of lung field: Secondary | ICD-10-CM | POA: Diagnosis not present

## 2020-12-07 DIAGNOSIS — Z794 Long term (current) use of insulin: Secondary | ICD-10-CM | POA: Diagnosis not present

## 2020-12-07 LAB — VITAMIN B1: Vitamin B1 (Thiamine): 146.1 nmol/L (ref 66.5–200.0)

## 2020-12-10 DIAGNOSIS — H0520 Unspecified exophthalmos: Secondary | ICD-10-CM | POA: Diagnosis not present

## 2020-12-10 DIAGNOSIS — Z794 Long term (current) use of insulin: Secondary | ICD-10-CM | POA: Diagnosis not present

## 2020-12-10 DIAGNOSIS — E1165 Type 2 diabetes mellitus with hyperglycemia: Secondary | ICD-10-CM | POA: Diagnosis not present

## 2020-12-10 DIAGNOSIS — E119 Type 2 diabetes mellitus without complications: Secondary | ICD-10-CM | POA: Diagnosis not present

## 2020-12-13 NOTE — Progress Notes (Deleted)
Cardiology Office Note:    Date:  12/13/2020   ID:  Molly [name], DOB Aug 15, 1950, MRN [id number]  PCP:  [name], MD   [facility] Providers Cardiologist:  Jenne Campus, MD { Click to update primary MD,subspecialty MD or APP then REFRESH:1}    Referring MD: [name],*   Follow-up for acute on chronic CHF with preserved ejection fraction History of Present Illness:    Molly [name] is a 70 y.o. female with a hx of type 2 diabetes, HFpEF, hypertension, hyperlipidemia, PVD, and CKD stage IV.  She was transferred from [facility] ER to [facility] due to CHF exacerbation and acute respiratory failure with hypercapnia and hypoxemia.  She was found to have acute hypoxic and hypercapnia respiratory failure as well as acute on chronic diastolic CHF.  She received BiPAP therapy and IV diuresis.  She was diuresed 4.4 L and switched to oral diuretics.  Her follow-up echocardiogram showed an EF of 60 to 65% and her carvedilol was discontinued at that time.  Her VQ scan and Dopplers were negative for PE and DVT.  She clinically improved and was weaned off supplemental O2.  Her creatinine elevated to 3.3.  Her baseline creatinine is around 2.4.  A renal ultrasound was negative for hydronephrosis.  She follows with nephrology.  Her blood pressure was well controlled on amlodipine and hydralazine.  She presents the clinic today for follow-up evaluation and states***  *** denies chest pain, shortness of breath, lower extremity edema, fatigue, palpitations, melena, hematuria, hemoptysis, diaphoresis, weakness, presyncope, syncope, orthopnea, and PND.   Past Medical History:  Diagnosis Date   Abdominal aortic atherosclerosis with stenosis 06/29/2015   AKI (acute kidney injury) ([place]) 06/29/2015   Atherosclerosis of coronary artery without angina pectoris 04/21/2020   Atopic dermatitis 04/21/2020   Benign hypertension with CKD (chronic kidney disease) stage  III ([place]) 04/21/2020   Bilateral impacted cerumen 08/21/2019   Chest pain 08/31/2020   Cholecystitis 06/29/2015   Cholelithiasis 06/29/2015   Claudication in peripheral vascular disease ([place]) 08/28/2019   Peripheral arterial disease   Colon cancer screening 04/21/2020   Dehydration with hyponatremia 06/29/2015   Diabetes mellitus ([place]) 07/15/2018   Diarrhea 04/21/2020   DKA (diabetic ketoacidoses) 06/29/2015   Dyslipidemia 08/30/2018   Elevated troponin    Epigastric pain 04/21/2020   Gastroesophageal reflux disease 04/21/2020   HTN (hypertension) 06/29/2015   Hyperglycemia due to type 2 diabetes mellitus ([place]) 04/21/2020   Hypertension    Hypertensive emergency 07/12/2020   Hypertensive heart disease without congestive heart failure 04/21/2020   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   Proptosis 04/21/2020   Proximal leg weakness 09/10/2018   Ptosis of left eyelid 09/10/2018   Renal artery stenosis ([place]) 12/17/2019   Renal artery stenosis   Standard chest x-ray abnormal 04/21/2020   Vitamin D deficiency 04/21/2020   Weight loss 04/21/2020    Past Surgical History:  Procedure Laterality Date  ABDOMINAL AORTOGRAM W/LOWER EXTREMITY N/A 08/28/2019   Procedure: ABDOMINAL AORTOGRAM W/LOWER EXTREMITY;  Surgeon: Lorretta Harp, MD;  Location: [facility];  Service: Cardiovascular;  Laterality: N/A;   CATARACT EXTRACTION, BILATERAL     CHOLECYSTECTOMY N/A 06/30/2015    Procedure: LAPAROSCOPIC CHOLECYSTECTOMY WITH INTRAOPERATIVE CHOLANGIOGRAM;  Surgeon: Donnie Mesa, MD;  Location: [facility];  Service: General;  Laterality: N/A;   PERIPHERAL VASCULAR BALLOON ANGIOPLASTY Right 08/28/2019   Procedure: PERIPHERAL VASCULAR BALLOON ANGIOPLASTY;  Surgeon: Lorretta Harp, MD;  Location: [facility];  Service: Cardiovascular;  Laterality: Right;  attempted SFA    Current Medications: No outpatient medications have been marked as taking for the 12/14/20 encounter (Appointment) with Deberah Pelton, NP.     Allergies:   Lisinopril and Mercury   Social History   Socioeconomic History   Marital status: Married    Spouse name: Not on file   Number of children: Not on file   Years of education: Not on file   Highest education level: Not on file  Occupational History   Not on file  Tobacco Use   Smoking status: Never   Smokeless tobacco: Never  Vaping Use   Vaping Use: Never used  Substance and Sexual Activity   Alcohol use: No   Drug use: Never   Sexual activity: Not on file  Other Topics Concern   Not on file  Social History Narrative   Not on file   Social Determinants of Health   Financial Resource Strain: Not on file  Food Insecurity: Not on file  Transportation Needs: Not on file  Physical Activity: Not on file  Stress: Not on file  Social Connections: Not on file     Family History: The patient's ***family history includes Breast cancer in her mother; Hypertension in her father and mother; Pancreatic cancer in an other family member.  ROS:   Please see the history of present illness.    *** All other systems reviewed and are negative.   Risk Assessment/Calculations:   {Does this patient have ATRIAL FIBRILLATION?:[phone]}       Physical Exam:    VS:  There were no vitals taken for this visit.    Wt Readings from Last 3 Encounters:  12/03/20 140 lb 10.5 oz (63.8 kg)  11/26/20 169 lb (76.7 kg)  10/08/20 143 lb 3.2 oz  (65 kg)     GEN: *** Well nourished, well developed in no acute distress HEENT: Normal NECK: No JVD; No carotid bruits LYMPHATICS: No lymphadenopathy CARDIAC: ***RRR, no murmurs, rubs, gallops RESPIRATORY:  Clear to auscultation without rales, wheezing or rhonchi  ABDOMEN: Soft, non-tender, non-distended MUSCULOSKELETAL:  No edema; No deformity  SKIN: Warm and dry NEUROLOGIC:  Alert and oriented x 3 PSYCHIATRIC:  Normal affect    EKGs/Labs/Other Studies Reviewed:    The following studies were reviewed today: Echocardiogram 11/27/2020 IMPRESSIONS     1. Left ventricular ejection fraction, by estimation, is 60 to 65%. The  left ventricle has normal function. The left ventricle has no regional  wall motion abnormalities. There is mild left ventricular hypertrophy.  Left ventricular diastolic parameters  are consistent with Grade II diastolic dysfunction (pseudonormalization).  Elevated left atrial pressure.   2. Right ventricular systolic function is normal. The right ventricular  size is normal. There is normal pulmonary artery systolic pressure.   3. A small pericardial effusion is present.   4. The mitral valve is normal in structure. No evidence of mitral valve  regurgitation. No evidence of mitral stenosis.   5. The aortic valve is tricuspid. Aortic valve regurgitation is not  visualized. Mild aortic valve sclerosis is present, with no evidence of  aortic valve stenosis.   6. The inferior vena cava is normal in size with greater than 50%  respiratory variability, suggesting right atrial pressure of 3 mmHg.   EKG:  EKG is *** ordered today.  The ekg ordered today demonstrates ***  Recent Labs: 11/26/2020: B Natriuretic Peptide 1,695.6 11/29/2020: ALT 46; Magnesium 2.0; TSH 2.532 12/03/2020: BUN 38; Creatinine, Ser 3.35; Hemoglobin 11.3; Platelets 207; Potassium 3.8; Sodium 137  Recent Lipid Panel    Component Value Date/Time   CHOL 190 08/31/2020 0930   CHOL 116  12/10/2019 1136   TRIG 61 08/31/2020 0930   HDL 71 08/31/2020 0930   HDL 52 12/10/2019 1136   CHOLHDL 2.7 08/31/2020 0930   VLDL 12 08/31/2020 0930   LDLCALC 107 (H) 08/31/2020 0930   LDLCALC 46 12/10/2019 1136    ASSESSMENT & PLAN    Acute on chronic diastolic CHF-euvolemic today.  Has returned to baseline physical activity.  Echocardiogram 11/27/2020 showed LVEF 60-65%, G2 DD and mild aortic valve sclerosis.  She was diuresed around 4.4 L during her 11/26/2020 - 12/03/2020 admission. Continue furosemide Heart healthy low-sodium diet-salty 6 given Increase physical activity as tolerated  Hypertensive urgency-BP today***.  Blood pressure fairly well controlled at home.  Carvedilol discontinued during hospitalization due to bradycardia.  Recommendation to avoid beta blocking agents. Continue amlodipine, hydralazine, furosemide Heart healthy low-sodium diet-salty 6 given Increase physical activity as tolerated Maintain blood pressure log  Elevated troponin-no recent episodes of arm neck back or chest discomfort.  Elevated troponin felt to be related to demand ischemia. Continue good blood pressure control, volume control/level Heart healthy low-sodium diet-salty 6 given Increase physical activity as tolerated  CKD stage III-4-creatinine 3.35 on 12/03/2020.  Creatinine at baseline around 2.4. Follows with PCP  Disposition: Follow-up with Dr. Agustin Cree in 3-4 months.  {Are you ordering a CV Procedure (e.g. stress test, cath, DCCV, TEE, etc)?   Press F2        :[id number]    Medication Adjustments/Labs and Tests Ordered: Current medicines are reviewed at length with the patient today.  Concerns regarding medicines are outlined above.  No orders of the defined types were placed in this encounter.  No orders of the defined types were placed in this encounter.   There are no Patient Instructions on file for this visit.   Signed, Deberah Pelton, NP  12/13/2020 6:05 AM       Notice: This dictation was prepared with Dragon dictation along with smaller phrase technology. Any transcriptional errors that result from this process are unintentional and may not be corrected upon review.  I spent***minutes examining this patient, reviewing medications, and using patient centered shared decision making involving her cardiac care.  Prior to her visit I spent greater than 20 minutes reviewing her past medical history,  medications, and prior cardiac tests.

## 2020-12-14 ENCOUNTER — Ambulatory Visit: Payer: Medicare Other | Admitting: Pulmonary Disease

## 2020-12-14 ENCOUNTER — Other Ambulatory Visit: Payer: Self-pay

## 2020-12-14 ENCOUNTER — Encounter: Payer: Self-pay | Admitting: Pulmonary Disease

## 2020-12-14 ENCOUNTER — Ambulatory Visit (HOSPITAL_BASED_OUTPATIENT_CLINIC_OR_DEPARTMENT_OTHER): Payer: Medicare Other | Admitting: General Practice

## 2020-12-14 VITALS — BP 132/78 | HR 82 | Ht 62.0 in | Wt 142.0 lb

## 2020-12-14 DIAGNOSIS — R0689 Other abnormalities of breathing: Secondary | ICD-10-CM

## 2020-12-14 DIAGNOSIS — G4730 Sleep apnea, unspecified: Secondary | ICD-10-CM | POA: Diagnosis not present

## 2020-12-14 NOTE — Progress Notes (Signed)
_0  ID: Molly [name], female    DOB: 1950-12-26, 70 y.o.   MRN: [id number]  Chief Complaint  Patient presents with   Hospitalization Follow-up    Was in the hospital 8/12-8/19 for resp failure. States she has been doing well since being home. Patient states she needs a sleep study before hospital discharge summary.     Referring provider: [name],*  HPI:   70 year old whome we are seeing in hospital follow-up after recent admission for hypoxemic respiratory failure due to pulmonary edema and volume overload.  H&P as well as discharge summary reviewed.  Overall she is doing much better.  Breathing better.  She is on maintenance Lasix at this time.  Seems that weight and lower extremity swelling have been relatively well controlled.  Breathing stable.  No cough.  No fever.  Reviewed in the hospital she had evidence of hypercarbia given her blood gases.  It seems that she was recommended to have a polysomnography at time of discharge as documented in her discharge summary.  She would like to discuss that.  We discussed the rationale for polysomnography and how it could help the heart and reduce stress/hypoxemia to the heart at night if hypopnea or apnea is present and that I could improve volume overload and help keep around the hospital.  She expressed understanding and is interested in moving forward with polysomnography.   HPI at initial visit: Presented to ED 08/31/20 with chest pain. Noted to hypertensive, desaturation to 69%. Placed on [place]. Marland Kitchen Noted to have CKD. Worsened and required NIPPV. Diuresed. BP controlled with oral agents. LHC deferred given CKD. TTE reviewed with low normal EF 50%, G1 ddfxn, RV size and function ok. CXR on admission reviewed with left sided infiltrate on my interpreation. VQ scan low risk PE on review. CT chest w/o contrast with patchy lingula and LLL infiltrates. No abx. Improved. Discharged on 2L [place]. PCCM was not consulted.   She has DOE.  Present on flat surfaces, worse on staris/inclines. Using 2L [place]. Not checking O2 saturations. Thinks symptoms have improved. Feels weak but slowly getting stronger. No breathing issues prior to admission.   PMH: HTN, Ddfxn Family History: HTN breast cancer in mom, HTN in dad Surgical history: gall bladder, cataract Social History: never smoker, lives on [place] / Pulmonary Flowsheets:   ACT:  No flowsheet data found.  MMRC: No flowsheet data found.  Epworth:  No flowsheet data found.  Tests:   FENO:  No results found for: NITRICOXIDE  PFT: PFT Results Latest Ref Rng & Units 10/15/2020  FVC-Pre L 1.16  FVC-Predicted Pre % 52  FVC-Post L 1.28  FVC-Predicted Post % 58  Pre FEV1/FVC % % 82  Post FEV1/FCV % % 84  FEV1-Pre L 0.95  FEV1-Predicted Pre % 55  FEV1-Post L 1.08  DLCO uncorrected ml/min/mmHg 7.67  DLCO UNC% % 41  DLCO corrected ml/min/mmHg 8.23  DLCO COR %Predicted % 44  DLVA Predicted % 73  TLC L 6.23  TLC % Predicted % 131  RV % Predicted % 240  Personally viewed interpreted as spirometry suggestive of gas trapping versus restriction.  TLC reveals hyperinflation, gas trapping present on lung volumes.  DLCO severely reduced.  WALK:  SIX MIN WALK 10/08/2020  Supplimental Oxygen during Test? (L/min) No  Tech Comments: Patient had to rest was able to talk while ambulating with walker    Imaging: Reviewed and as per EMR and discussion in this note DG Chest  2 View  Result Date: 12/07/2020 CLINICAL DATA:  70 year old female with abnormal chest x-ray EXAM: CHEST - 2 VIEW COMPARISON:  11/26/2020, 10/08/2020, 09/10/2020, chest CT 08/31/2020 FINDINGS: Cardiomediastinal silhouette unchanged in size and contour. No evidence of central vascular congestion. No interlobular septal thickening. Low lung volumes persist. Linear opacities in the left lung, similar to the comparison with mild reticulonodular opacities at the lung bases, similar distribution to  the comparison chest x-ray and CT. No pneumothorax or pleural effusion. No acute displaced fracture. Degenerative changes of the spine. IMPRESSION: Persisting low lung volumes, with similar appearance of linear opacities on the left and bibasilar reticulonodular opacities, most compatible with sequela of multifocal infection. Electronically Signed   By: Corrie Mckusick D.O.   On: 12/07/2020 14:16   US RENAL  Result Date: 11/27/2020 CLINICAL DATA:  Chronic renal failure EXAM: RENAL / URINARY TRACT ULTRASOUND COMPLETE COMPARISON:  09/04/2020 FINDINGS: Right Kidney: Renal measurements: 10.3 x 4.3 x 4.8 cm. = volume: 108 mL. Mild extrarenal pelvis is noted. No obstructive changes are seen. Left Kidney: Renal measurements: 8.7 x 4.3 x 4.3 cm. = volume: 83 mL. Echogenicity within normal limits. No mass or hydronephrosis visualized. Bladder: Appears normal for degree of bladder distention. Other: None. IMPRESSION: No acute abnormality noted. Electronically Signed   By: Inez Catalina M.D.   On: 11/27/2020 22:13   DG Chest Port 1 View  Result Date: 11/26/2020 CLINICAL DATA:  SOB (shortness of breath) R06.02 (ICD-10-CM) EXAM: PORTABLE CHEST 1 VIEW COMPARISON:  October 08, 2020. FINDINGS: Low lung volumes with increased left greater than right basilar opacities. No visible pleural effusions or pneumothorax. Cardiomediastinal silhouette is mildly enlarged, likely accentuated by low lung volumes and AP technique. No acute osseous abnormality. IMPRESSION: Low lung volumes with increased left greater than right basilar opacities, suspicious for pneumonia. Electronically Signed   By: Margaretha Sheffield M.D.   On: 11/26/2020 13:42   ECHOCARDIOGRAM COMPLETE  Result Date: 11/27/2020    ECHOCARDIOGRAM REPORT   Patient Name:   Molly [name] Date of Exam: 11/27/2020 Medical Rec #:  [id number]    Height:       62.0 in Accession #:    [id number]   Weight:       162.0 lb Date of Birth:  Sep 09, 1950    BSA:          1.748 m Patient Age:     70 years     BP:           148/78 mmHg Patient Gender: F            HR:           51 bpm. Exam Location:  Inpatient Procedure: 2D Echo, Cardiac Doppler and Color Doppler Indications:    CHF  History:        Patient has prior history of Echocardiogram examinations, most                 recent 08/31/2020. CHF, PVD, Signs/Symptoms:Shortness of Breath                 and Resp. failure, CKD; Risk Factors:Diabetes, Hypertension,                 Dyslipidemia and Obesity.  Sonographer:    Dustin Flock RDCS Referring Phys: [id number] [place]  1. Left ventricular ejection fraction, by estimation, is 60 to 65%. The left ventricle has normal function. The left ventricle has no regional wall motion abnormalities.  There is mild left ventricular hypertrophy. Left ventricular diastolic parameters are consistent with Grade II diastolic dysfunction (pseudonormalization). Elevated left atrial pressure.  2. Right ventricular systolic function is normal. The right ventricular size is normal. There is normal pulmonary artery systolic pressure.  3. A small pericardial effusion is present.  4. The mitral valve is normal in structure. No evidence of mitral valve regurgitation. No evidence of mitral stenosis.  5. The aortic valve is tricuspid. Aortic valve regurgitation is not visualized. Mild aortic valve sclerosis is present, with no evidence of aortic valve stenosis.  6. The inferior vena cava is normal in size with greater than 50% respiratory variability, suggesting right atrial pressure of 3 mmHg. FINDINGS  Left Ventricle: Left ventricular ejection fraction, by estimation, is 60 to 65%. The left ventricle has normal function. The left ventricle has no regional wall motion abnormalities. The left ventricular internal cavity size was normal in size. There is  mild left ventricular hypertrophy. Left ventricular diastolic parameters are consistent with Grade II diastolic dysfunction (pseudonormalization). Elevated  left atrial pressure. Right Ventricle: The right ventricular size is normal. Right ventricular systolic function is normal. There is normal pulmonary artery systolic pressure. The tricuspid regurgitant velocity is 2.70 m/s, and with an assumed right atrial pressure of 3 mmHg,  the estimated right ventricular systolic pressure is 57.2 mmHg. Left Atrium: Left atrial size was normal in size. Right Atrium: Right atrial size was normal in size. Pericardium: A small pericardial effusion is present. Mitral Valve: The mitral valve is normal in structure. Mild mitral annular calcification. No evidence of mitral valve regurgitation. No evidence of mitral valve stenosis. Tricuspid Valve: The tricuspid valve is normal in structure. Tricuspid valve regurgitation is mild . No evidence of tricuspid stenosis. Aortic Valve: The aortic valve is tricuspid. Aortic valve regurgitation is not visualized. Mild aortic valve sclerosis is present, with no evidence of aortic valve stenosis. Pulmonic Valve: The pulmonic valve was not well visualized. Pulmonic valve regurgitation is trivial. No evidence of pulmonic stenosis. Aorta: The aortic root is normal in size and structure. Venous: The inferior vena cava is normal in size with greater than 50% respiratory variability, suggesting right atrial pressure of 3 mmHg. IAS/Shunts: No atrial level shunt detected by color flow Doppler.  LEFT VENTRICLE PLAX 2D LVIDd:         3.70 cm  Diastology LVIDs:         2.70 cm  LV e' medial:    3.92 cm/s LV PW:         1.20 cm  LV E/e' medial:  23.8 LV IVS:        1.40 cm  LV e' lateral:   3.81 cm/s LVOT diam:     1.90 cm  LV E/e' lateral: 24.5 LV SV:         60 LV SV Index:   34 LVOT Area:     2.84 cm  RIGHT VENTRICLE RV Basal diam:  2.90 cm RV S prime:     7.72 cm/s TAPSE (M-mode): 1.8 cm LEFT ATRIUM           Index       RIGHT ATRIUM           Index LA diam:      4.30 cm 2.46 cm/m  RA Area:     15.90 cm LA Vol (A2C): 29.9 ml 17.10 ml/m RA Volume:    38.30 ml  21.91 ml/m LA Vol (A4C): 57.0 ml 32.61 ml/m  AORTIC VALVE LVOT Vmax:   84.90 cm/s LVOT Vmean:  53.800 cm/s LVOT VTI:    0.211 m  AORTA Ao Root diam: 3.10 cm MITRAL VALVE               TRICUSPID VALVE MV Area (PHT): 2.72 cm    TR Peak grad:   29.2 mmHg MV Decel Time: 279 msec    TR Vmax:        270.00 cm/s MV E velocity: 93.40 cm/s MV A velocity: 92.10 cm/s  SHUNTS MV E/A ratio:  1.01        Systemic VTI:  0.21 m                            Systemic Diam: 1.90 cm Kirk Ruths MD Electronically signed by Kirk Ruths MD Signature Date/Time: 11/27/2020/1:14:38 PM    Final    VAS [place] LOWER EXTREMITY VENOUS (DVT)  Result Date: 11/30/2020  Lower Venous DVT Study Patient Name:  STUART GUILLEN  Date of Exam:   11/30/2020 Medical Rec #: [id number]     Accession #:    [id number] Date of Birth: 11/29/1950     Patient Gender: F Patient Age:   38 years Exam [facility]  [facility] Procedure:      VAS [place] LOWER EXTREMITY VENOUS (DVT) Referring Phys: PRANAV PATEL --------------------------------------------------------------------------------  Indications: Edema.  Risk Factors: None identified. Comparison Study: No prior studies. Performing Technologist: Oliver Hum RVT  Examination Guidelines: A complete evaluation includes B-mode imaging, spectral Doppler, color Doppler, and power Doppler as needed of all accessible portions of each vessel. Bilateral testing is considered an integral part of a complete examination. Limited examinations for reoccurring indications may be performed as noted. The reflux portion of the exam is performed with the patient in reverse Trendelenburg.  +---------+---------------+---------+-----------+----------+--------------+ RIGHT    CompressibilityPhasicitySpontaneityPropertiesThrombus Aging +---------+---------------+---------+-----------+----------+--------------+ CFV      Full           Yes      Yes                                  +---------+---------------+---------+-----------+----------+--------------+ SFJ      Full                                                        +---------+---------------+---------+-----------+----------+--------------+ FV Prox  Full                                                        +---------+---------------+---------+-----------+----------+--------------+ FV Mid   Full                                                        +---------+---------------+---------+-----------+----------+--------------+ FV DistalFull                                                        +---------+---------------+---------+-----------+----------+--------------+  PFV      Full                                                        +---------+---------------+---------+-----------+----------+--------------+ POP      Full           Yes      Yes                                 +---------+---------------+---------+-----------+----------+--------------+ PTV      Full                                                        +---------+---------------+---------+-----------+----------+--------------+ PERO     Full                                                        +---------+---------------+---------+-----------+----------+--------------+   +---------+---------------+---------+-----------+----------+--------------+ LEFT     CompressibilityPhasicitySpontaneityPropertiesThrombus Aging +---------+---------------+---------+-----------+----------+--------------+ CFV      Full           Yes      Yes                                 +---------+---------------+---------+-----------+----------+--------------+ SFJ      Full                                                        +---------+---------------+---------+-----------+----------+--------------+ FV Prox  Full                                                         +---------+---------------+---------+-----------+----------+--------------+ FV Mid   Full                                                        +---------+---------------+---------+-----------+----------+--------------+ FV DistalFull                                                        +---------+---------------+---------+-----------+----------+--------------+ PFV      Full                                                        +---------+---------------+---------+-----------+----------+--------------+  POP      Full           Yes      Yes                                 +---------+---------------+---------+-----------+----------+--------------+ PTV      Full                                                        +---------+---------------+---------+-----------+----------+--------------+ PERO     Full                                                        +---------+---------------+---------+-----------+----------+--------------+     Summary: RIGHT: - There is no evidence of deep vein thrombosis in the lower extremity.  - No cystic structure found in the popliteal fossa.  LEFT: - There is no evidence of deep vein thrombosis in the lower extremity.  - No cystic structure found in the popliteal fossa.  *See table(s) above for measurements and observations. Electronically signed by Deitra Mayo MD on 11/30/2020 at 4:44:14 PM.    Final     Lab Results: Personally reviewed CBC    Component Value Date/Time   WBC 7.6 12/03/2020 0107   RBC 4.21 12/03/2020 0107   HGB 11.3 (L) 12/03/2020 0107   HGB 12.5 08/19/2019 1111   HCT 37.3 12/03/2020 0107   HCT 39.4 08/19/2019 1111   PLT 207 12/03/2020 0107   PLT 299 08/19/2019 1111   MCV 88.6 12/03/2020 0107   MCV 86 08/19/2019 1111   MCH 26.8 12/03/2020 0107   MCHC 30.3 12/03/2020 0107   RDW 15.2 12/03/2020 0107   RDW 12.7 08/19/2019 1111   LYMPHSABS 1.3 11/26/2020 1226   MONOABS 0.5 11/26/2020 1226   EOSABS 0.1  11/26/2020 1226   BASOSABS 0.0 11/26/2020 1226    BMET    Component Value Date/Time   NA 137 12/03/2020 0107   NA 141 09/03/2019 1600   K 3.8 12/03/2020 0107   CL 91 (L) 12/03/2020 0107   CO2 34 (H) 12/03/2020 0107   GLUCOSE 140 (H) 12/03/2020 0107   BUN 38 (H) 12/03/2020 0107   BUN 24 09/03/2019 1600   CREATININE 3.35 (H) 12/03/2020 0107   CALCIUM 8.2 (L) 12/03/2020 0107   GFRNONAA 14 (L) 12/03/2020 0107   GFRAA 59 (L) 09/03/2019 1600    BNP    Component Value Date/Time   BNP 1,695.6 (H) 11/26/2020 1226    ProBNP No results found for: PROBNP  Specialty Problems       Pulmonary Problems   Acute on chronic respiratory failure with hypoxia (HCC)   Acute on chronic respiratory failure with hypoxia and hypercapnia (HCC)   Dyspnea on exertion    Allergies  Allergen Reactions   Lisinopril Other (See Comments)   Mercury Nausea And Vomiting    Immunization History  Administered Date(s) Administered   Influenza,inj,Quad PF,6+ Mos 06/13/2017   MMR 09/02/2008   PFIZER(Purple Top)SARS-COV-2 Vaccination 06/08/2019, 07/02/2019   Pneumococcal Conjugate-13 10/15/2017   Td 08/31/2008    Past Medical History:  Diagnosis  Date   Abdominal aortic atherosclerosis with stenosis 06/29/2015   AKI (acute kidney injury) ([place]) 06/29/2015   Atherosclerosis of coronary artery without angina pectoris 04/21/2020   Atopic dermatitis 04/21/2020   Benign hypertension with CKD (chronic kidney disease) stage III ([place]) 04/21/2020   Bilateral impacted cerumen 08/21/2019   Chest pain 08/31/2020   Cholecystitis 06/29/2015   Cholelithiasis 06/29/2015   Claudication in peripheral vascular disease ([place]) 08/28/2019   Peripheral arterial disease   Colon cancer screening 04/21/2020   Dehydration with hyponatremia 06/29/2015   Diabetes mellitus ([place]) 07/15/2018   Diarrhea 04/21/2020   DKA (diabetic ketoacidoses) 06/29/2015   Dyslipidemia 08/30/2018   Elevated troponin    Epigastric pain 04/21/2020    Gastroesophageal reflux disease 04/21/2020   HTN (hypertension) 06/29/2015   Hyperglycemia due to type 2 diabetes mellitus ([place]) 04/21/2020   Hypertension    Hypertensive emergency 07/12/2020   Hypertensive heart disease without congestive heart failure 04/21/2020   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   Cholecalciferol 25 MCG (1000 UT) tablet Take 2,000 Units by mouth daily.   cilostazol (PLETAL) 50 MG tablet Take 1 tablet (50 mg total) by mouth 2 (two) times daily.   ezetimibe (ZETIA) 10 MG tablet Take 1 tablet (10 mg total) by mouth daily.   famotidine (PEPCID) 20 MG tablet Take 20 mg by mouth in the morning and at bedtime.   furosemide (LASIX) 40 MG tablet Take 1 tablet (40 mg total) by mouth daily.   hydrALAZINE (APRESOLINE) 25 MG tablet Take 1 tablet (25 mg total) by mouth every 12 (twelve) hours.    insulin isophane & regular human KwikPen (HUMULIN 70/30 MIX) (70-30) 100 UNIT/ML KwikPen Inject 8-12 Units into the skin daily.   ketoconazole (NIZORAL) 2 % cream Apply 1 application topically daily as needed for irritation.    levocetirizine (XYZAL) 5 MG tablet Take 5 mg by mouth daily as needed for allergies.   loperamide (IMODIUM A-D) 2 MG tablet Take 2 mg by mouth 4 (four) times daily as needed for diarrhea or loose stools.   Pediatric Multivitamins-Iron (FLINTSTONES COMPLETE) 18 MG CHEW Chew 1 tablet by mouth daily. Unknown strength   rosuvastatin (CRESTOR) 40 MG tablet TAKE 1 TABLET(40 MG) BY MOUTH AT BEDTIME (Patient taking differently: Take 40 mg by mouth daily.)   triamcinolone cream (KENALOG) 0.1 % Apply 1 application topically daily  as needed (spots on legs).    [DISCONTINUED] Carboxymethylcell-Hypromellose (GENTEAL OP) Place 1 drop into both eyes daily as needed (dry eyes).   No facility-administered encounter medications on file as of 12/14/2020.     Review of Systems  Review of Systems  N/a  Physical Exam  BP 132/78   Pulse 82   Ht 5' 2" (1.575 m)   Wt 142 lb (64.4 kg)   SpO2 99% Comment: on RA  BMI 25.97 kg/m   Wt Readings from Last 5 Encounters:  12/14/20 142 lb (64.4 kg)  12/03/20 140 lb 10.5 oz (63.8 kg)  11/26/20 169 lb (76.7 kg)  10/08/20 143 lb 3.2 oz (65 kg)  10/07/20 143 lb (64.9 kg)    BMI Readings from Last 5 Encounters:  12/14/20 25.97 kg/m  12/03/20 25.73 kg/m  11/26/20 30.91 kg/m  10/08/20 26.19 kg/m  10/07/20 26.16 kg/m     Physical Exam Gen: Sitting in chair, in NAD Eyes: EOMI, no icterus Neck: Supple, no JVP appreciated CV: RRR, no murmur Pulm: Faint inspiratory crackles at the bases, NWOB Abd: ND, BS present MSK: No joint effusion, no synovitis Neuro: Slow gait, no focal weakness Psych: Normal mood, full affect   Assessment & Plan:   Acute hypoxemic Respiratory Failure: Now resolved.  Given multiple recurrences most  recent set of vital overload think that this most reflects pulmonary edema from cardiogenic source.  PFTs consistent with gas trapping but no emphysema never smoker.  Suspect longstanding asthma which did not classically cause hypoxemia.  Her DLCO severely reduced.  There is no evidence of interstitial lung disease nor is there suggestion of elevated pulmonary pressures to suggest pulmonary vascular disease.  Suspect this is related to chronic volume overload.  No fixed obstruction to suggest bronchiolitis obliterans.  Sleep disordered breathing: Sleepy, wakes up at night sometimes.  Will obtain home polysomnography and discuss neck steps based on results.   Return in about 3 months (around 03/16/2021).   Lanier Clam, MD 12/14/2020

## 2020-12-14 NOTE — Patient Instructions (Signed)
Nice to see you  Glad you are feeling better. I think the lasix will really help.  I ordered a home sleep study to evaluate for sleep apnea. Based on the results, I will let you know next steps.  Return to clinic in 3 months. We may alter the timeline based on the timing of the sleep test.

## 2020-12-24 ENCOUNTER — Ambulatory Visit (HOSPITAL_BASED_OUTPATIENT_CLINIC_OR_DEPARTMENT_OTHER): Payer: Medicare Other | Admitting: Family

## 2020-12-27 ENCOUNTER — Emergency Department (HOSPITAL_COMMUNITY): Payer: Medicare Other

## 2020-12-27 ENCOUNTER — Encounter (HOSPITAL_COMMUNITY): Payer: Self-pay | Admitting: *Deleted

## 2020-12-27 ENCOUNTER — Inpatient Hospital Stay (HOSPITAL_COMMUNITY)
Admission: EM | Admit: 2020-12-27 | Discharge: 2021-01-17 | DRG: 252 | Disposition: A | Discharge status: Home Health | Payer: Medicare Other | Attending: Student in an Organized Health Care Education/Training Program | Admitting: Student in an Organized Health Care Education/Training Program

## 2020-12-27 ENCOUNTER — Other Ambulatory Visit: Payer: Self-pay

## 2020-12-27 DIAGNOSIS — Z8249 Family history of ischemic heart disease and other diseases of the circulatory system: Secondary | ICD-10-CM

## 2020-12-27 DIAGNOSIS — Z6825 Body mass index (BMI) 25.0-25.9, adult: Secondary | ICD-10-CM

## 2020-12-27 DIAGNOSIS — D638 Anemia in other chronic diseases classified elsewhere: Secondary | ICD-10-CM | POA: Diagnosis not present

## 2020-12-27 DIAGNOSIS — J9602 Acute respiratory failure with hypercapnia: Secondary | ICD-10-CM | POA: Diagnosis not present

## 2020-12-27 DIAGNOSIS — E559 Vitamin D deficiency, unspecified: Secondary | ICD-10-CM | POA: Diagnosis not present

## 2020-12-27 DIAGNOSIS — R188 Other ascites: Secondary | ICD-10-CM | POA: Diagnosis not present

## 2020-12-27 DIAGNOSIS — D631 Anemia in chronic kidney disease: Secondary | ICD-10-CM | POA: Diagnosis present

## 2020-12-27 DIAGNOSIS — I5033 Acute on chronic diastolic (congestive) heart failure: Secondary | ICD-10-CM | POA: Diagnosis present

## 2020-12-27 DIAGNOSIS — E1122 Type 2 diabetes mellitus with diabetic chronic kidney disease: Secondary | ICD-10-CM | POA: Diagnosis present

## 2020-12-27 DIAGNOSIS — I701 Atherosclerosis of renal artery: Secondary | ICD-10-CM | POA: Diagnosis present

## 2020-12-27 DIAGNOSIS — I161 Hypertensive emergency: Secondary | ICD-10-CM | POA: Diagnosis present

## 2020-12-27 DIAGNOSIS — E1151 Type 2 diabetes mellitus with diabetic peripheral angiopathy without gangrene: Secondary | ICD-10-CM | POA: Diagnosis present

## 2020-12-27 DIAGNOSIS — E11649 Type 2 diabetes mellitus with hypoglycemia without coma: Secondary | ICD-10-CM | POA: Diagnosis not present

## 2020-12-27 DIAGNOSIS — Z794 Long term (current) use of insulin: Secondary | ICD-10-CM

## 2020-12-27 DIAGNOSIS — N184 Chronic kidney disease, stage 4 (severe): Secondary | ICD-10-CM

## 2020-12-27 DIAGNOSIS — I248 Other forms of acute ischemic heart disease: Secondary | ICD-10-CM | POA: Diagnosis not present

## 2020-12-27 DIAGNOSIS — I2510 Atherosclerotic heart disease of native coronary artery without angina pectoris: Secondary | ICD-10-CM | POA: Diagnosis present

## 2020-12-27 DIAGNOSIS — Z992 Dependence on renal dialysis: Secondary | ICD-10-CM | POA: Diagnosis not present

## 2020-12-27 DIAGNOSIS — R319 Hematuria, unspecified: Secondary | ICD-10-CM

## 2020-12-27 DIAGNOSIS — I429 Cardiomyopathy, unspecified: Secondary | ICD-10-CM | POA: Diagnosis present

## 2020-12-27 DIAGNOSIS — I132 Hypertensive heart and chronic kidney disease with heart failure and with stage 5 chronic kidney disease, or end stage renal disease: Secondary | ICD-10-CM | POA: Diagnosis not present

## 2020-12-27 DIAGNOSIS — Z87448 Personal history of other diseases of urinary system: Secondary | ICD-10-CM | POA: Diagnosis not present

## 2020-12-27 DIAGNOSIS — J9622 Acute and chronic respiratory failure with hypercapnia: Secondary | ICD-10-CM | POA: Diagnosis not present

## 2020-12-27 DIAGNOSIS — W010XXA Fall on same level from slipping, tripping and stumbling without subsequent striking against object, initial encounter: Secondary | ICD-10-CM | POA: Diagnosis not present

## 2020-12-27 DIAGNOSIS — R7989 Other specified abnormal findings of blood chemistry: Secondary | ICD-10-CM | POA: Diagnosis present

## 2020-12-27 DIAGNOSIS — R310 Gross hematuria: Secondary | ICD-10-CM | POA: Diagnosis not present

## 2020-12-27 DIAGNOSIS — N201 Calculus of ureter: Secondary | ICD-10-CM | POA: Diagnosis not present

## 2020-12-27 DIAGNOSIS — I4891 Unspecified atrial fibrillation: Secondary | ICD-10-CM | POA: Diagnosis not present

## 2020-12-27 DIAGNOSIS — K449 Diaphragmatic hernia without obstruction or gangrene: Secondary | ICD-10-CM | POA: Diagnosis present

## 2020-12-27 DIAGNOSIS — I5043 Acute on chronic combined systolic (congestive) and diastolic (congestive) heart failure: Secondary | ICD-10-CM | POA: Diagnosis present

## 2020-12-27 DIAGNOSIS — E785 Hyperlipidemia, unspecified: Secondary | ICD-10-CM | POA: Diagnosis not present

## 2020-12-27 DIAGNOSIS — Z8781 Personal history of (healed) traumatic fracture: Secondary | ICD-10-CM | POA: Diagnosis not present

## 2020-12-27 DIAGNOSIS — N1832 Chronic kidney disease, stage 3b: Secondary | ICD-10-CM | POA: Diagnosis not present

## 2020-12-27 DIAGNOSIS — I742 Embolism and thrombosis of arteries of the upper extremities: Secondary | ICD-10-CM | POA: Diagnosis not present

## 2020-12-27 DIAGNOSIS — K3189 Other diseases of stomach and duodenum: Secondary | ICD-10-CM | POA: Diagnosis not present

## 2020-12-27 DIAGNOSIS — Z7189 Other specified counseling: Secondary | ICD-10-CM | POA: Diagnosis not present

## 2020-12-27 DIAGNOSIS — R531 Weakness: Secondary | ICD-10-CM | POA: Diagnosis not present

## 2020-12-27 DIAGNOSIS — N179 Acute kidney failure, unspecified: Secondary | ICD-10-CM | POA: Diagnosis not present

## 2020-12-27 DIAGNOSIS — W19XXXA Unspecified fall, initial encounter: Secondary | ICD-10-CM

## 2020-12-27 DIAGNOSIS — R079 Chest pain, unspecified: Secondary | ICD-10-CM | POA: Diagnosis not present

## 2020-12-27 DIAGNOSIS — I480 Paroxysmal atrial fibrillation: Secondary | ICD-10-CM | POA: Diagnosis not present

## 2020-12-27 DIAGNOSIS — I272 Pulmonary hypertension, unspecified: Secondary | ICD-10-CM | POA: Diagnosis present

## 2020-12-27 DIAGNOSIS — Z452 Encounter for adjustment and management of vascular access device: Secondary | ICD-10-CM | POA: Diagnosis not present

## 2020-12-27 DIAGNOSIS — I10 Essential (primary) hypertension: Secondary | ICD-10-CM | POA: Diagnosis present

## 2020-12-27 DIAGNOSIS — E871 Hypo-osmolality and hyponatremia: Secondary | ICD-10-CM | POA: Diagnosis not present

## 2020-12-27 DIAGNOSIS — I509 Heart failure, unspecified: Secondary | ICD-10-CM | POA: Diagnosis not present

## 2020-12-27 DIAGNOSIS — Y92231 Patient bathroom in hospital as the place of occurrence of the external cause: Secondary | ICD-10-CM | POA: Diagnosis not present

## 2020-12-27 DIAGNOSIS — Z01818 Encounter for other preprocedural examination: Secondary | ICD-10-CM | POA: Diagnosis not present

## 2020-12-27 DIAGNOSIS — J9621 Acute and chronic respiratory failure with hypoxia: Secondary | ICD-10-CM | POA: Diagnosis not present

## 2020-12-27 DIAGNOSIS — N136 Pyonephrosis: Secondary | ICD-10-CM | POA: Diagnosis present

## 2020-12-27 DIAGNOSIS — E440 Moderate protein-calorie malnutrition: Secondary | ICD-10-CM | POA: Diagnosis present

## 2020-12-27 DIAGNOSIS — R339 Retention of urine, unspecified: Secondary | ICD-10-CM | POA: Diagnosis not present

## 2020-12-27 DIAGNOSIS — Z20822 Contact with and (suspected) exposure to covid-19: Secondary | ICD-10-CM | POA: Diagnosis present

## 2020-12-27 DIAGNOSIS — M25552 Pain in left hip: Secondary | ICD-10-CM

## 2020-12-27 DIAGNOSIS — J9811 Atelectasis: Secondary | ICD-10-CM | POA: Diagnosis not present

## 2020-12-27 DIAGNOSIS — N138 Other obstructive and reflux uropathy: Secondary | ICD-10-CM | POA: Diagnosis present

## 2020-12-27 DIAGNOSIS — Z419 Encounter for procedure for purposes other than remedying health state, unspecified: Secondary | ICD-10-CM

## 2020-12-27 DIAGNOSIS — I169 Hypertensive crisis, unspecified: Secondary | ICD-10-CM

## 2020-12-27 DIAGNOSIS — J9601 Acute respiratory failure with hypoxia: Secondary | ICD-10-CM | POA: Diagnosis not present

## 2020-12-27 DIAGNOSIS — M8580 Other specified disorders of bone density and structure, unspecified site: Secondary | ICD-10-CM | POA: Diagnosis not present

## 2020-12-27 DIAGNOSIS — D62 Acute posthemorrhagic anemia: Secondary | ICD-10-CM | POA: Diagnosis present

## 2020-12-27 DIAGNOSIS — F32A Depression, unspecified: Secondary | ICD-10-CM | POA: Diagnosis present

## 2020-12-27 DIAGNOSIS — R778 Other specified abnormalities of plasma proteins: Secondary | ICD-10-CM

## 2020-12-27 DIAGNOSIS — N1330 Unspecified hydronephrosis: Secondary | ICD-10-CM | POA: Diagnosis not present

## 2020-12-27 DIAGNOSIS — R040 Epistaxis: Secondary | ICD-10-CM | POA: Diagnosis present

## 2020-12-27 DIAGNOSIS — R0602 Shortness of breath: Secondary | ICD-10-CM | POA: Diagnosis not present

## 2020-12-27 DIAGNOSIS — G9341 Metabolic encephalopathy: Secondary | ICD-10-CM | POA: Diagnosis not present

## 2020-12-27 DIAGNOSIS — R195 Other fecal abnormalities: Secondary | ICD-10-CM | POA: Diagnosis not present

## 2020-12-27 DIAGNOSIS — K2970 Gastritis, unspecified, without bleeding: Secondary | ICD-10-CM | POA: Diagnosis not present

## 2020-12-27 DIAGNOSIS — G8918 Other acute postprocedural pain: Secondary | ICD-10-CM

## 2020-12-27 DIAGNOSIS — R935 Abnormal findings on diagnostic imaging of other abdominal regions, including retroperitoneum: Secondary | ICD-10-CM

## 2020-12-27 DIAGNOSIS — Z7901 Long term (current) use of anticoagulants: Secondary | ICD-10-CM | POA: Diagnosis not present

## 2020-12-27 DIAGNOSIS — Z79899 Other long term (current) drug therapy: Secondary | ICD-10-CM

## 2020-12-27 DIAGNOSIS — D649 Anemia, unspecified: Secondary | ICD-10-CM

## 2020-12-27 DIAGNOSIS — K219 Gastro-esophageal reflux disease without esophagitis: Secondary | ICD-10-CM | POA: Diagnosis present

## 2020-12-27 DIAGNOSIS — N186 End stage renal disease: Secondary | ICD-10-CM | POA: Diagnosis not present

## 2020-12-27 DIAGNOSIS — Z800 Family history of malignant neoplasm of digestive organs: Secondary | ICD-10-CM

## 2020-12-27 DIAGNOSIS — E1165 Type 2 diabetes mellitus with hyperglycemia: Secondary | ICD-10-CM | POA: Diagnosis present

## 2020-12-27 DIAGNOSIS — K222 Esophageal obstruction: Secondary | ICD-10-CM | POA: Diagnosis present

## 2020-12-27 DIAGNOSIS — J189 Pneumonia, unspecified organism: Secondary | ICD-10-CM | POA: Diagnosis present

## 2020-12-27 DIAGNOSIS — S72142A Displaced intertrochanteric fracture of left femur, initial encounter for closed fracture: Secondary | ICD-10-CM | POA: Diagnosis not present

## 2020-12-27 DIAGNOSIS — I130 Hypertensive heart and chronic kidney disease with heart failure and stage 1 through stage 4 chronic kidney disease, or unspecified chronic kidney disease: Secondary | ICD-10-CM | POA: Diagnosis not present

## 2020-12-27 DIAGNOSIS — R3121 Asymptomatic microscopic hematuria: Secondary | ICD-10-CM | POA: Diagnosis not present

## 2020-12-27 DIAGNOSIS — Z803 Family history of malignant neoplasm of breast: Secondary | ICD-10-CM

## 2020-12-27 DIAGNOSIS — Z9049 Acquired absence of other specified parts of digestive tract: Secondary | ICD-10-CM

## 2020-12-27 DIAGNOSIS — W19XXXD Unspecified fall, subsequent encounter: Secondary | ICD-10-CM | POA: Diagnosis not present

## 2020-12-27 DIAGNOSIS — D72829 Elevated white blood cell count, unspecified: Secondary | ICD-10-CM | POA: Diagnosis not present

## 2020-12-27 DIAGNOSIS — N132 Hydronephrosis with renal and ureteral calculous obstruction: Secondary | ICD-10-CM | POA: Diagnosis not present

## 2020-12-27 DIAGNOSIS — Z043 Encounter for examination and observation following other accident: Secondary | ICD-10-CM | POA: Diagnosis not present

## 2020-12-27 DIAGNOSIS — Z515 Encounter for palliative care: Secondary | ICD-10-CM | POA: Diagnosis not present

## 2020-12-27 DIAGNOSIS — K2980 Duodenitis without bleeding: Secondary | ICD-10-CM | POA: Diagnosis not present

## 2020-12-27 LAB — GLUCOSE, CAPILLARY
Glucose-Capillary: 177 mg/dL — ABNORMAL HIGH (ref 70–99)
Glucose-Capillary: 164 mg/dL — ABNORMAL HIGH (ref 70–99)

## 2020-12-27 LAB — CBC
HCT: 35.9 % — ABNORMAL LOW (ref 36.0–46.0)
Hemoglobin: 11.4 g/dL — ABNORMAL LOW (ref 12.0–15.0)
MCH: 27.7 pg (ref 26.0–34.0)
MCHC: 31.8 g/dL (ref 30.0–36.0)
MCV: 87.3 fL (ref 80.0–100.0)
Platelets: 369 10*3/uL (ref 150–400)
RBC: 4.11 MIL/uL (ref 3.87–5.11)
RDW: 14.4 % (ref 11.5–15.5)
WBC: 8.8 10*3/uL (ref 4.0–10.5)
nRBC: 0 % (ref 0.0–0.2)

## 2020-12-27 LAB — BASIC METABOLIC PANEL
Anion gap: 14 (ref 5–15)
BUN: 36 mg/dL — ABNORMAL HIGH (ref 8–23)
CO2: 26 mmol/L (ref 22–32)
Calcium: 9.7 mg/dL (ref 8.9–10.3)
Chloride: 91 mmol/L — ABNORMAL LOW (ref 98–111)
Creatinine, Ser: 3.53 mg/dL — ABNORMAL HIGH (ref 0.44–1.00)
GFR, Estimated: 14 mL/min — ABNORMAL LOW (ref >60)
Glucose, Bld: 166 mg/dL — ABNORMAL HIGH (ref 70–99)
Potassium: 3.8 mmol/L (ref 3.5–5.1)
Sodium: 131 mmol/L — ABNORMAL LOW (ref 135–145)

## 2020-12-27 LAB — I-STAT VENOUS BLOOD GAS, ED
Acid-Base Excess: 2 mmol/L (ref 0.0–2.0)
Bicarbonate: 30.1 mmol/L — ABNORMAL HIGH (ref 20.0–28.0)
Calcium, Ion: 1.17 mmol/L (ref 1.15–1.40)
HCT: 38 % (ref 36.0–46.0)
Hemoglobin: 12.9 g/dL (ref 12.0–15.0)
O2 Saturation: 90 %
Potassium: 3.8 mmol/L (ref 3.5–5.1)
Sodium: 130 mmol/L — ABNORMAL LOW (ref 135–145)
TCO2: 32 mmol/L (ref 22–32)
pCO2, Ven: 61.8 mmHg — ABNORMAL HIGH (ref 44.0–60.0)
pH, Ven: 7.295 (ref 7.250–7.430)
pO2, Ven: 66 mmHg — ABNORMAL HIGH (ref 32.0–45.0)

## 2020-12-27 LAB — RESP PANEL BY RT-PCR (FLU A&B, COVID) ARPGX2
Influenza A by PCR: NEGATIVE
Influenza B by PCR: NEGATIVE
SARS Coronavirus 2 by RT PCR: NEGATIVE

## 2020-12-27 LAB — BRAIN NATRIURETIC PEPTIDE: B Natriuretic Peptide: 1102.1 pg/mL — ABNORMAL HIGH (ref 0.0–100.0)

## 2020-12-27 LAB — TROPONIN I (HIGH SENSITIVITY)
Troponin I (High Sensitivity): 397 ng/L (ref <18)
Troponin I (High Sensitivity): 376 ng/L (ref <18)

## 2020-12-27 LAB — PROCALCITONIN: Procalcitonin: 0.39 ng/mL

## 2020-12-27 MED ORDER — HYDROCORTISONE 1 % EX CREA
1.0000 "application " | TOPICAL_CREAM | Freq: Four times a day (QID) | CUTANEOUS | Status: DC | PRN
  Filled 2020-12-27: qty 28

## 2020-12-27 MED ORDER — CILOSTAZOL 50 MG PO TABS
50.0000 mg | ORAL_TABLET | Freq: Two times a day (BID) | ORAL | Status: DC
  Administered 2020-12-27 – 2021-01-17 (×34): 50 mg via ORAL
  Filled 2020-12-27 (×45): qty 1

## 2020-12-27 MED ORDER — FAMOTIDINE 20 MG PO TABS
20.0000 mg | ORAL_TABLET | Freq: Two times a day (BID) | ORAL | Status: DC
  Administered 2020-12-27 – 2020-12-28 (×3): 20 mg via ORAL
  Filled 2020-12-27 (×3): qty 1

## 2020-12-27 MED ORDER — SODIUM CHLORIDE 0.9% FLUSH
3.0000 mL | Freq: Two times a day (BID) | INTRAVENOUS | Status: DC
  Administered 2020-12-27 – 2021-01-11 (×22): 3 mL via INTRAVENOUS

## 2020-12-27 MED ORDER — SODIUM CHLORIDE 0.9 % IV SOLN: 250.0000 mL | INTRAVENOUS | Status: DC | PRN

## 2020-12-27 MED ORDER — FUROSEMIDE 10 MG/ML IJ SOLN
120.0000 mg | Freq: Once | INTRAVENOUS | Status: AC
  Administered 2020-12-27: 120 mg via INTRAVENOUS
  Filled 2020-12-27: qty 10

## 2020-12-27 MED ORDER — ACETAMINOPHEN 325 MG PO TABS
650.0000 mg | ORAL_TABLET | ORAL | Status: DC | PRN
  Administered 2020-12-28 – 2021-01-13 (×14): 650 mg via ORAL
  Filled 2020-12-27 (×16): qty 2

## 2020-12-27 MED ORDER — ALBUTEROL SULFATE (2.5 MG/3ML) 0.083% IN NEBU
5.0000 mg | INHALATION_SOLUTION | Freq: Once | RESPIRATORY_TRACT | Status: AC
  Administered 2020-12-27: 5 mg via RESPIRATORY_TRACT
  Filled 2020-12-27: qty 6

## 2020-12-27 MED ORDER — HYDRALAZINE HCL 25 MG PO TABS
25.0000 mg | ORAL_TABLET | Freq: Two times a day (BID) | ORAL | Status: DC
  Administered 2020-12-27 – 2020-12-28 (×3): 25 mg via ORAL
  Filled 2020-12-27 (×3): qty 1

## 2020-12-27 MED ORDER — EZETIMIBE 10 MG PO TABS
10.0000 mg | ORAL_TABLET | Freq: Every day | ORAL | Status: DC
  Administered 2020-12-27 – 2021-01-17 (×17): 10 mg via ORAL
  Filled 2020-12-27 (×19): qty 1

## 2020-12-27 MED ORDER — INSULIN ASPART PROT & ASPART (70-30 MIX) 100 UNIT/ML ~~LOC~~ SUSP
8.0000 [IU] | Freq: Every day | SUBCUTANEOUS | Status: DC
  Administered 2020-12-28 – 2021-01-02 (×4): 8 [IU] via SUBCUTANEOUS
  Filled 2020-12-27: qty 10

## 2020-12-27 MED ORDER — LOPERAMIDE HCL 2 MG PO CAPS: 2.0000 mg | ORAL_CAPSULE | Freq: Four times a day (QID) | ORAL | Status: DC | PRN

## 2020-12-27 MED ORDER — INSULIN ASPART 100 UNIT/ML IJ SOLN
0.0000 [IU] | Freq: Three times a day (TID) | INTRAMUSCULAR | Status: DC
  Administered 2020-12-27 – 2020-12-28 (×2): 1 [IU] via SUBCUTANEOUS
  Administered 2020-12-28: 5 [IU] via SUBCUTANEOUS
  Administered 2020-12-29 – 2021-01-01 (×4): 1 [IU] via SUBCUTANEOUS

## 2020-12-27 MED ORDER — ROSUVASTATIN CALCIUM 20 MG PO TABS
40.0000 mg | ORAL_TABLET | Freq: Every day | ORAL | Status: DC
  Administered 2020-12-27 – 2021-01-04 (×6): 40 mg via ORAL
  Filled 2020-12-27 (×7): qty 2

## 2020-12-27 MED ORDER — SODIUM CHLORIDE 0.9 % IV SOLN
2.0000 g | INTRAVENOUS | Status: DC
  Administered 2020-12-27 – 2020-12-30 (×4): 2 g via INTRAVENOUS
  Filled 2020-12-27 (×4): qty 20

## 2020-12-27 MED ORDER — ASPIRIN 325 MG PO TABS
325.0000 mg | ORAL_TABLET | Freq: Once | ORAL | Status: AC
  Administered 2020-12-27: 325 mg via ORAL
  Filled 2020-12-27: qty 1

## 2020-12-27 MED ORDER — SODIUM CHLORIDE 0.9% FLUSH: 3.0000 mL | INTRAVENOUS | Status: DC | PRN

## 2020-12-27 MED ORDER — HEPARIN SODIUM (PORCINE) 5000 UNIT/ML IJ SOLN
5000.0000 [IU] | Freq: Three times a day (TID) | INTRAMUSCULAR | Status: DC
  Administered 2020-12-27 (×2): 5000 [IU] via SUBCUTANEOUS
  Filled 2020-12-27 (×2): qty 1

## 2020-12-27 MED ORDER — FUROSEMIDE 10 MG/ML IJ SOLN
40.0000 mg | Freq: Two times a day (BID) | INTRAMUSCULAR | Status: DC
  Administered 2020-12-27: 40 mg via INTRAVENOUS
  Filled 2020-12-27: qty 4

## 2020-12-27 MED ORDER — IPRATROPIUM BROMIDE 0.02 % IN SOLN
0.5000 mg | Freq: Once | RESPIRATORY_TRACT | Status: AC
  Administered 2020-12-27: 0.5 mg via RESPIRATORY_TRACT
  Filled 2020-12-27: qty 2.5

## 2020-12-27 MED ORDER — AMLODIPINE BESYLATE 10 MG PO TABS
10.0000 mg | ORAL_TABLET | Freq: Every day | ORAL | Status: DC
  Administered 2020-12-27 – 2020-12-30 (×4): 10 mg via ORAL
  Filled 2020-12-27: qty 1
  Filled 2020-12-27: qty 2
  Filled 2020-12-27 (×3): qty 1

## 2020-12-27 MED ORDER — LORATADINE 10 MG PO TABS: 5.0000 mg | ORAL_TABLET | Freq: Every day | ORAL | Status: DC | PRN

## 2020-12-27 NOTE — H&P (Signed)
History and Physical    Molly [name] [id number] DOB: Jun 10, 1950 DOA: 12/27/2020  Referring MD/NP/PA: Alcus Dad, MD(resident) PCP: [name], MD  Consultants:Ikechukwu Nwobu, MD-nephrology Jenne Campus, MD-cardiology Larey Days, MD-pulmonology Patient coming from: Home  Chief Complaint: Chest pain  I have personally briefly reviewed patient's old medical records in [place]   HPI: Molly [name] is a 70 y.o. female with medical history significant of HTN, diastolic CHF, CKD stage IV, PAD, anemia of chronic kidney disease, and diabetes mellitus type 2 presents with complaints of a chest pain for the last 3 to 4 days.  Patient reports having a right substernal chest heaviness.  Noted associated symptoms of shortness of breath, orthopnea, insomnia, weight gain of approximately 5 pounds in the last week, nausea, vomiting, and belching.  Denies any fever, cough, leg swelling, abdominal pain, or dysuria symptoms.  She had tried taking extra dose of furosemide for 2 days without any change in symptoms.  Previously admitted last month for acute on hypercapnic respiratory failure with hypoxia secondary to diastolic heart failure exacerbation.  ED Course: On admission into the emergency department patient was noted to be afebrile, pulse 10 7-1 20, respirations 17-28, blood pressures elevated up to 234/208, and O2 saturations as low as 78% with improvement on BiPAP.  Labs significant for sodium 131, BUN 36, creatinine 3.53, BNP 1102.1, high-sensitivity troponin 397->376.  Chest x-ray gave concern for increase in asymmetric pulmonary interstitial opacity compared to last month for which asymmetric pulmonary edema was considered versus viral/atypical respiratory infection.  Venous blood gas revealed pH 7.295, PCO2 61.8, and PO2 66.  Patient had been ordered DuoNeb breathing treatment.  TRH called to admit  Review of Systems  Constitutional:  Positive for malaise/fatigue.        Positive for weight gain  Cardiovascular:  Positive for chest pain and orthopnea. Negative for leg swelling.  Neurological:  Negative for loss of consciousness.   Past Medical History:  Diagnosis Date   Abdominal aortic atherosclerosis with stenosis 06/29/2015   AKI (acute kidney injury) ([place]) 06/29/2015   Atherosclerosis of coronary artery without angina pectoris 04/21/2020   Atopic dermatitis 04/21/2020   Benign hypertension with CKD (chronic kidney disease) stage III ([place]) 04/21/2020   Bilateral impacted cerumen 08/21/2019   Chest pain 08/31/2020   Cholecystitis 06/29/2015   Cholelithiasis 06/29/2015   Claudication in peripheral vascular disease ([place]) 08/28/2019   Peripheral arterial disease   Colon cancer screening 04/21/2020   Dehydration with hyponatremia 06/29/2015   Diabetes mellitus ([place]) 07/15/2018   Diarrhea 04/21/2020   DKA (diabetic ketoacidoses) 06/29/2015   Dyslipidemia 08/30/2018   Elevated troponin    Epigastric pain 04/21/2020   Gastroesophageal reflux disease 04/21/2020   HTN (hypertension) 06/29/2015   Hyperglycemia due to type 2 diabetes mellitus ([place]) 04/21/2020   Hypertension    Hypertensive emergency 07/12/2020   Hypertensive heart disease without congestive heart failure 04/21/2020   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  Proptosis 04/21/2020   Proximal leg weakness 09/10/2018   Ptosis of left eyelid 09/10/2018   Renal artery stenosis  ([place]) 12/17/2019   Renal artery stenosis   Standard chest x-ray abnormal 04/21/2020   Vitamin D deficiency 04/21/2020   Weight loss 04/21/2020    Past Surgical History:  Procedure Laterality Date   ABDOMINAL AORTOGRAM W/LOWER EXTREMITY N/A 08/28/2019   Procedure: ABDOMINAL AORTOGRAM W/LOWER EXTREMITY;  Surgeon: Lorretta Harp, MD;  Location: [facility];  Service: Cardiovascular;  Laterality: N/A;   CATARACT EXTRACTION, BILATERAL     CHOLECYSTECTOMY N/A 06/30/2015   Procedure: LAPAROSCOPIC CHOLECYSTECTOMY WITH INTRAOPERATIVE CHOLANGIOGRAM;  Surgeon: Donnie Mesa, MD;  Location: [facility];  Service: General;  Laterality: N/A;   PERIPHERAL VASCULAR BALLOON ANGIOPLASTY Right 08/28/2019   Procedure: PERIPHERAL VASCULAR BALLOON ANGIOPLASTY;  Surgeon: Lorretta Harp, MD;  Location: [facility];  Service: Cardiovascular;  Laterality: Right;  attempted SFA     reports that she has never smoked. She has never used smokeless tobacco. She reports that she does not drink alcohol and does not use drugs.  Allergies  Allergen Reactions   Lisinopril Other (See Comments)   Mercury Nausea And Vomiting    Family History  Problem Relation Age of Onset   Breast cancer Mother    Hypertension Mother    Hypertension Father    Pancreatic cancer Other        uncle    Prior to Admission medications   Medication Sig Start Date End Date Taking? Authorizing Provider  amLODipine (NORVASC) 10 MG tablet Take 1 tablet (10 mg total) by mouth daily. 12/03/20   Domenic Polite, MD  Cholecalciferol 25 MCG (1000 UT) tablet Take 2,000 Units by mouth daily.    [provider]  cilostazol (PLETAL) 50 MG tablet Take 1 tablet (50 mg total) by mouth 2 (two) times daily. 03/15/20   Lorretta Harp, MD  ezetimibe (ZETIA) 10 MG tablet Take 1 tablet (10 mg total) by mouth daily. 10/28/20 07/25/21  Park Liter, MD  famotidine (PEPCID) 20 MG tablet Take 20 mg by mouth in the morning and at bedtime.     [provider]  furosemide (LASIX) 40 MG tablet Take 1 tablet (40 mg total) by mouth daily. 12/03/20   Domenic Polite, MD  hydrALAZINE (APRESOLINE) 25 MG tablet Take 1 tablet (25 mg total) by mouth every 12 (twelve) hours. 10/28/20 07/25/21  Park Liter, MD  insulin isophane & regular human KwikPen (HUMULIN 70/30 MIX) (70-30) 100 UNIT/ML KwikPen Inject 8-12 Units into the skin daily. 12/03/20   Domenic Polite, MD  ketoconazole (NIZORAL) 2 % cream Apply 1 application topically daily as needed for irritation.  05/08/19   [provider]  levocetirizine (XYZAL) 5 MG tablet Take 5 mg by mouth daily as needed for allergies. 05/12/19   [provider]  loperamide (IMODIUM A-D) 2 MG tablet Take 2 mg by mouth 4 (four) times daily as needed for diarrhea or loose stools.    [provider]  Pediatric Multivitamins-Iron (FLINTSTONES COMPLETE) 18 MG CHEW Chew 1 tablet by mouth daily. Unknown strength    [provider]  rosuvastatin (CRESTOR) 40 MG tablet TAKE 1 TABLET(40 MG) BY MOUTH AT BEDTIME Patient taking differently: Take 40 mg by mouth daily. 05/10/20   Park Liter, MD  triamcinolone cream (KENALOG) 0.1 % Apply 1 application topically daily as needed (spots on legs).     [provider]    Physical  Exam:  Constitutional: NAD, calm, comfortable Vitals:   12/27/20 0930 12/27/20 0945 12/27/20 1000 12/27/20 1030  BP: 138/70 (!) 137/56 (!) 165/145 119/86  Pulse: (!) 113 (!) 112 (!) 114 (!) 107  Resp: (!) 24 (!) 23 (!) 25 (!) 24  Temp:      SpO2: 100% 100% 100% 100%  Weight:      Height:       Eyes: PERRL, lids and conjunctivae normal ENMT: Mucous membranes are moist. Posterior pharynx clear of any exudate or lesions.Normal dentition.  Neck: normal, supple, no masses, no thyromegaly Respiratory: clear to auscultation bilaterally, no wheezing, no crackles. Normal respiratory effort. No accessory muscle use.  Cardiovascular: Regular  rate and rhythm, no murmurs / rubs / gallops. No extremity edema. 2+ pedal pulses. No carotid bruits.  Abdomen: no tenderness, no masses palpated. No hepatosplenomegaly. Bowel sounds positive.  Musculoskeletal: no clubbing / cyanosis. No joint deformity upper and lower extremities. Good ROM, no contractures. Normal muscle tone.  Skin: no rashes, lesions, ulcers. No induration Neurologic: CN 2-12 grossly intact. Sensation intact, DTR normal. Strength 5/5 in all 4.  Psychiatric: Normal judgment and insight. Alert and oriented x 3. Normal mood.     Labs on Admission: I have personally reviewed following labs and imaging studies  CBC: Recent Labs  Lab 12/27/20 0640 12/27/20 0847  WBC 8.8  --   HGB 11.4* 12.9  HCT 35.9* 38.0  MCV 87.3  --   PLT 369  --    Basic Metabolic Panel: Recent Labs  Lab 12/27/20 0640 12/27/20 0847  NA 131* 130*  K 3.8 3.8  CL 91*  --   CO2 26  --   GLUCOSE 166*  --   BUN 36*  --   CREATININE 3.53*  --   CALCIUM 9.7  --    GFR: Estimated Creatinine Clearance: 13.7 mL/min (A) (by C-G formula based on SCr of 3.53 mg/dL (H)). Liver Function Tests: No results for input(s): AST, ALT, ALKPHOS, BILITOT, PROT, ALBUMIN in the last 168 hours. No results for input(s): LIPASE, AMYLASE in the last 168 hours. No results for input(s): AMMONIA in the last 168 hours. Coagulation Profile: No results for input(s): INR, PROTIME in the last 168 hours. Cardiac Enzymes: No results for input(s): CKTOTAL, CKMB, CKMBINDEX, TROPONINI in the last 168 hours. BNP (last 3 results) No results for input(s): PROBNP in the last 8760 hours. HbA1C: No results for input(s): HGBA1C in the last 72 hours. CBG: No results for input(s): GLUCAP in the last 168 hours. Lipid Profile: No results for input(s): CHOL, HDL, LDLCALC, TRIG, CHOLHDL, LDLDIRECT in the last 72 hours. Thyroid Function Tests: No results for input(s): TSH, T4TOTAL, FREET4, T3FREE, THYROIDAB in the last 72  hours. Anemia Panel: No results for input(s): VITAMINB12, FOLATE, FERRITIN, TIBC, IRON, RETICCTPCT in the last 72 hours. Urine analysis:    Component Value Date/Time   COLORURINE YELLOW 11/28/2020 1842   APPEARANCEUR CLOUDY (A) 11/28/2020 1842   LABSPEC 1.006 11/28/2020 1842   PHURINE 6.0 11/28/2020 1842   GLUCOSEU NEGATIVE 11/28/2020 1842   HGBUR MODERATE (A) 11/28/2020 1842   BILIRUBINUR NEGATIVE 11/28/2020 1842   KETONESUR NEGATIVE 11/28/2020 1842   PROTEINUR NEGATIVE 11/28/2020 1842   NITRITE NEGATIVE 11/28/2020 1842   LEUKOCYTESUR TRACE (A) 11/28/2020 1842   Sepsis Labs: Recent Results (from the past 240 hour(s))  Resp Panel by RT-PCR (Flu A&B, Covid) Nasopharyngeal Swab     Status: None   Collection Time: 12/27/20  8:53 AM  Specimen: Nasopharyngeal Swab; Nasopharyngeal(NP) swabs in vial transport medium  Result Value Ref Range Status   SARS Coronavirus 2 by RT PCR NEGATIVE NEGATIVE Final    Comment: (NOTE) SARS-CoV-2 target nucleic acids are NOT DETECTED.  The SARS-CoV-2 RNA is generally detectable in upper respiratory specimens during the acute phase of infection. The lowest concentration of SARS-CoV-2 viral copies this assay can detect is 138 copies/mL. A negative result does not preclude SARS-Cov-2 infection and should not be used as the sole basis for treatment or other patient management decisions. A negative result may occur with  improper specimen collection/handling, submission of specimen other than nasopharyngeal swab, presence of viral mutation(s) within the areas targeted by this assay, and inadequate number of viral copies(<138 copies/mL). A negative result must be combined with clinical observations, patient history, and epidemiological information. The expected result is Negative.  Fact Sheet for Patients:  EntrepreneurPulse.com.au  Fact Sheet for Healthcare Providers:  IncredibleEmployment.be  This test is no t  yet approved or cleared by the [place] FDA and  has been authorized for detection and/or diagnosis of SARS-CoV-2 by FDA under an Emergency Use Authorization (EUA). This EUA will remain  in effect (meaning this test can be used) for the duration of the COVID-19 declaration under Section 564(b)(1) of the Act, 21 U.S.C.section 360bbb-3(b)(1), unless the authorization is terminated  or revoked sooner.       Influenza A by PCR NEGATIVE NEGATIVE Final   Influenza B by PCR NEGATIVE NEGATIVE Final    Comment: (NOTE) The Xpert Xpress SARS-CoV-2/FLU/RSV plus assay is intended as an aid in the diagnosis of influenza from Nasopharyngeal swab specimens and should not be used as a sole basis for treatment. Nasal washings and aspirates are unacceptable for Xpert Xpress SARS-CoV-2/FLU/RSV testing.  Fact Sheet for Patients: EntrepreneurPulse.com.au  Fact Sheet for Healthcare Providers: IncredibleEmployment.be  This test is not yet approved or cleared by the [place] FDA and has been authorized for detection and/or diagnosis of SARS-CoV-2 by FDA under an Emergency Use Authorization (EUA). This EUA will remain in effect (meaning this test can be used) for the duration of the COVID-19 declaration under Section 564(b)(1) of the Act, 21 U.S.C. section 360bbb-3(b)(1), unless the authorization is terminated or revoked.  Performed at [facility], [place] [address], [place], [place] [address]      Radiological Exams on Admission: DG Chest 2 View  Result Date: 12/27/2020 CLINICAL DATA:  70 year old female with chest pain. EXAM: CHEST - 2 VIEW COMPARISON:  Chest radiographs 12/07/2020 and earlier. FINDINGS: Improved lung volumes. Mediastinal contours are stable with heart size at the upper limits of normal. Calcified aortic atherosclerosis. Visualized tracheal air column is within normal limits. Diffuse increased pulmonary interstitial opacity, hazy and  confluent at the right lung base. No pleural effusion or pneumothorax. No consolidation. Gas-filled bowel loops in the upper abdomen have increased, nonspecific. Stable cholecystectomy clips. No acute osseous abnormality identified. IMPRESSION: Increased and asymmetric pulmonary interstitial opacity since last month, confluent at the right lung base. No pleural effusion or consolidation. Top differential considerations include asymmetric pulmonary edema and viral/atypical respiratory infection Electronically Signed   By: Genevie Ann M.D.   On: 12/27/2020 07:26    EKG: Independently reviewed.  Multifocal atrial tachycardia 113 bpm with premature ventricular complexes  Assessment/Plan Acute on chronic respiratory failure with hypoxia and hypercapnia secondary to heart failure with preserved ejection fraction: Patient presents with complaints of shortness of breath with increased work of breathing and weight gain  of 5 pounds in the last week.  Documented to be hypoxic down to 78% on room with improvement after placed on BiPAP.  Venous blood gas significant for stable pH with PCO2 of 61.8.  Chest x-ray significant for increased asymmetric opacities concerning for asymmetric edema versus viral/atypical respiratory infection.  BNP was noted to be elevated at 1102.1.  Influenza and COVID-19 screening were negative.  Last echocardiogram revealed EF of 60 to 65% with grade 2 diastolic dysfunction.  Symptoms given concern more so for edema versus atypical pneumonia. -Admit to a progressive bed -Continuous pulse oximetry with nasal cannula oxygen maintain O2 saturations greater than 92% -Wean BiPAP off once able -Strict I&O's and daily weights -Lasix 40 mg IV twice daily -Cardiology consulted, will follow-up for any further recommendations  Chest pain: Elevated troponin: Acute on chronic.  Patient reports having intermittent chest pain over the last 3 to 4 days.  High-sensitivity troponin 397-> 376.  High-sensitivity  troponin was seen to be 123->163 last month.  Previous discussions appear to have been made in regards to stress testing and cardiac catheterization, but had previously declined due to fear of worsening kidney function. -Cardiology consulted, we will follow-up for any further recommendations  Possible pneumonia: Chest x-ray initially noted concern for asymmetric opacities concerning for possibility of atypical pneumonia. -Check procalcitonin -Will start empiric antibiotics of Rocephin IV  Chronic kidney disease stage IV: On admission creatinine 3.53 with BUN 36, but had been 3.35 on 8/19.  She is followed in outpatient setting by nephrology, but states that she does not want to go on dialysis.  Uestion possible hypoperfusion secondary to CHF versus progression of kidney disease.  Patient is followed in the outpatient setting by -Continue to monitor kidney function with diuresis -Consider need to formally consult nephrology if kidney function continues to worsen and/or not making any urine  Essential hypertension: Home blood pressure medications include amlodipine 10 mg daily, furosemide 40 mg daily, hydralazine 25 mg twice daily. -Continue amlodipine and hydralazine as tolerated  Diabetes mellitus type 2, uncontrolled: On admission glucose noted to be 166.  Last hemoglobin A1c was 8.2 on 8/13.  Home insulin regimen includes Humulin 70/30 mix 8 to 12 units daily. -Hypoglycemic protocols -70/30 insulin 8 units daily -CBGs before every meal with very sensitive SSI  Anemia of chronic kidney disease: Hemoglobin 11.4 g/dL which appears near patient's baseline. -Continue to monitor  PAD hyperlipidemia -Continue Pletal, aspirin, Crestor, and Zetia  GERD -Continue Pepcid twice daily  DVT prophylaxis: Heparin Code Status: Full Family Communication: None requested Disposition Plan: Likely discharge home once medically stable Consults called: Cardiology Admission status: Inpatient require more  than 2 midnight stay  Norval Morton MD [facility]   If 7PM-7AM, please contact night-coverage   12/27/2020, 10:40 AM

## 2020-12-27 NOTE — ED Provider Notes (Signed)
[facility]ERGENCY DEPARTMENT Provider Note   CSN: [id number] Arrival date & time: 12/27/20  0539     History Chief Complaint  Patient presents with   Chest Pain    Molly [name] is a 70 y.o. female presenting with shortness of breath.  Patient reports midsternal chest pain over the past 2-3 days. States she had a lot of belching, symptoms felt almost like reflux. Today she had worsening nausea and vomited x3. Since last night she has had progressively worsening shortness of breath. Could not sleep last night secondary to dyspnea.  No fever, cough, leg swelling, abdominal pain, or sick contacts. Patient has a history of similar presentation with acute hypoxemic hypercarbic respiratory failure in both May and August of this year. Was briefly on home oxygen after her May admission but it was discontinued by her pulmonologist at the end of June.  She reports good compliance with her home Lasix.     Past Medical History:  Diagnosis Date   Abdominal aortic atherosclerosis with stenosis 06/29/2015   AKI (acute kidney injury) ([place]) 06/29/2015   Atherosclerosis of coronary artery without angina pectoris 04/21/2020   Atopic dermatitis 04/21/2020   Benign hypertension with CKD (chronic kidney disease) stage III ([place]) 04/21/2020   Bilateral impacted cerumen 08/21/2019   Chest pain 08/31/2020   Cholecystitis 06/29/2015   Cholelithiasis 06/29/2015   Claudication in peripheral vascular disease ([place]) 08/28/2019   Peripheral arterial disease   Colon cancer screening 04/21/2020   Dehydration with hyponatremia 06/29/2015   Diabetes mellitus ([place]) 07/15/2018   Diarrhea 04/21/2020   DKA (diabetic ketoacidoses) 06/29/2015   Dyslipidemia 08/30/2018   Elevated troponin    Epigastric pain 04/21/2020   Gastroesophageal reflux disease 04/21/2020   HTN (hypertension) 06/29/2015   Hyperglycemia due to type 2 diabetes mellitus ([place]) 04/21/2020   Hypertension    Hypertensive emergency 07/12/2020    Hypertensive heart disease without congestive heart failure 04/21/2020   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   Proptosis 04/21/2020   Proximal leg weakness 09/10/2018   Ptosis of left eyelid 09/10/2018   Renal artery stenosis ([place]) 12/17/2019   Renal artery stenosis   Standard chest x-ray abnormal 04/21/2020   Vitamin D deficiency 04/21/2020   Weight loss 04/21/2020    Patient Active Problem List   Diagnosis Date Noted   Dyspnea on exertion 11/26/2020   Acute on chronic respiratory failure with hypoxia ([place]) 11/26/2020   Acute on chronic respiratory failure with hypoxia and hypercapnia ([place]) 11/26/2020   Stage 4 chronic kidney disease ([place]) 11/26/2020   Acute on chronic heart failure with preserved ejection fraction (HFpEF) ([place]) 11/26/2020   Elevated d-dimer 11/26/2020   Prolonged QT interval 11/26/2020   Congestive heart failure (CHF) ([place]) 10/07/2020   Elevated troponin    Chest pain 08/31/2020   Hypertensive emergency 07/12/2020   Type 2 diabetes mellitus with stage 3b chronic kidney disease, with long-term current use of insulin ([place]) 07/12/2020   Hypertension  Diabetes mellitus without complication ([place])    Atherosclerosis of coronary artery without angina pectoris 04/21/2020   Atopic dermatitis 04/21/2020   Colon cancer screening 04/21/2020   Diarrhea 04/21/2020   Gastroesophageal reflux disease 04/21/2020   Hyperglycemia due to type 2  diabetes mellitus ([place]) 04/21/2020   Hypertensive heart disease without congestive heart failure 04/21/2020   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   Left-sided chest pain 04/21/2020   Long term (current) use of insulin ([place]) 04/21/2020   Loss of appetite 04/21/2020   Nausea 04/21/2020   Occlusion and stenosis of bilateral carotid arteries 04/21/2020   Proptosis 04/21/2020   Benign hypertension with CKD (chronic kidney disease) stage III ([place]) 04/21/2020   Epigastric pain 04/21/2020   Standard chest x-ray abnormal 04/21/2020   Vitamin D deficiency 04/21/2020   Weight loss 04/21/2020   Renal artery stenosis ([facility]) 12/17/2019   Claudication in peripheral vascular disease ([place]) 08/28/2019   Bilateral impacted cerumen 08/21/2019   Irritable bowel syndrome with diarrhea 10/02/2018   Osteopenia 10/02/2018   Other dysphagia 09/10/2018   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   Proximal leg weakness 09/10/2018   Ptosis of left eyelid 09/10/2018   Dyslipidemia 08/30/2018   Type 2 diabetes mellitus with stage 4 chronic kidney disease, with long-term current use of insulin ([place]) 07/15/2018   Peripheral vascular disease ([place]) 07/15/2018   DKA (diabetic ketoacidoses) 06/29/2015   HTN (hypertension) 06/29/2015   Acute kidney injury ([place]) 06/29/2015   Cholelithiasis 06/29/2015   Abdominal aortic atherosclerosis with stenosis 06/29/2015   Dehydration with hyponatremia 06/29/2015   Leukocytosis 06/29/2015   Cholecystitis 06/29/2015    Past Surgical History:  Procedure Laterality Date   ABDOMINAL AORTOGRAM W/LOWER EXTREMITY N/A 08/28/2019   Procedure: ABDOMINAL AORTOGRAM W/LOWER EXTREMITY;  Surgeon: Lorretta Harp, MD;  Location: [facility];  Service: Cardiovascular;  Laterality: N/A;   CATARACT EXTRACTION, BILATERAL     CHOLECYSTECTOMY N/A 06/30/2015   Procedure: LAPAROSCOPIC CHOLECYSTECTOMY WITH INTRAOPERATIVE  CHOLANGIOGRAM;  Surgeon: Donnie Mesa, MD;  Location: [facility];  Service: General;  Laterality: N/A;   PERIPHERAL VASCULAR BALLOON ANGIOPLASTY Right 08/28/2019   Procedure: PERIPHERAL VASCULAR BALLOON ANGIOPLASTY;  Surgeon: Lorretta Harp, MD;  Location: [facility];  Service: Cardiovascular;  Laterality: Right;  attempted SFA     OB History   No obstetric history on file.     Family History  Problem Relation Age of Onset   Breast cancer Mother    Hypertension Mother    Hypertension Father    Pancreatic cancer Other        uncle    Social History   Tobacco Use   Smoking status: Never   Smokeless tobacco: Never  Vaping Use   Vaping Use: Never used  Substance Use Topics   Alcohol use: No   Drug use: Never    Home Medications Prior to Admission medications   Medication Sig Start Date End Date Taking? Authorizing Provider  amLODipine (NORVASC) 10 MG tablet Take 1 tablet (10 mg total) by mouth daily. 12/03/20   Domenic Polite, MD  Cholecalciferol 25 MCG (1000 UT) tablet Take 2,000 Units by mouth daily.    [provider]  cilostazol (PLETAL) 50 MG tablet Take 1 tablet (50 mg total) by mouth 2 (two) times daily. 03/15/20   Lorretta Harp, MD  ezetimibe (ZETIA) 10 MG tablet Take 1 tablet (10 mg total) by mouth daily. 10/28/20 07/25/21  Jenne Campus  J, MD  famotidine (PEPCID) 20 MG tablet Take 20 mg by mouth in the morning and at bedtime.    [provider]  furosemide (LASIX) 40 MG tablet Take 1 tablet (40 mg total) by mouth daily. 12/03/20   Domenic Polite, MD  hydrALAZINE (APRESOLINE) 25 MG tablet Take 1 tablet (25 mg total) by mouth every 12 (twelve) hours. 10/28/20 07/25/21  Park Liter, MD  insulin isophane & regular human KwikPen (HUMULIN 70/30 MIX) (70-30) 100 UNIT/ML KwikPen Inject 8-12 Units into the skin daily. 12/03/20   Domenic Polite, MD  ketoconazole (NIZORAL) 2 % cream Apply 1 application topically daily as needed for irritation.   05/08/19   [provider]  levocetirizine (XYZAL) 5 MG tablet Take 5 mg by mouth daily as needed for allergies. 05/12/19   [provider]  loperamide (IMODIUM A-D) 2 MG tablet Take 2 mg by mouth 4 (four) times daily as needed for diarrhea or loose stools.    [provider]  Pediatric Multivitamins-Iron (FLINTSTONES COMPLETE) 18 MG CHEW Chew 1 tablet by mouth daily. Unknown strength    [provider]  rosuvastatin (CRESTOR) 40 MG tablet TAKE 1 TABLET(40 MG) BY MOUTH AT BEDTIME Patient taking differently: Take 40 mg by mouth daily. 05/10/20   Park Liter, MD  triamcinolone cream (KENALOG) 0.1 % Apply 1 application topically daily as needed (spots on legs).     [provider]    Allergies    Lisinopril and Mercury  Review of Systems   Review of Systems  Constitutional:  Negative for fever.  HENT:  Negative for rhinorrhea.   Respiratory:  Positive for shortness of breath. Negative for cough.   Cardiovascular:  Positive for chest pain. Negative for leg swelling.  Gastrointestinal:  Positive for nausea and vomiting. Negative for abdominal pain.  Neurological:  Negative for syncope and headaches.   Physical Exam Updated Vital Signs BP 119/86   Pulse (!) 107   Temp 99.1 F (37.3 C)   Resp (!) 24   Ht '5\' 2"'$  (1.575 m)   Wt 66.7 kg   SpO2 100%   BMI 26.89 kg/m   Physical Exam Constitutional:      General: She is in acute distress.     Appearance: She is ill-appearing.  HENT:     Head: Normocephalic and atraumatic.  Cardiovascular:     Rate and Rhythm: Tachycardia present.     Heart sounds: Normal heart sounds.  Pulmonary:     Effort: Respiratory distress present.     Breath sounds: Wheezing and rales present.  Abdominal:     Palpations: Abdomen is soft.  Musculoskeletal:     Right lower leg: No edema.     Left lower leg: No edema.  Skin:    General: Skin is warm.     Coloration: Skin is not cyanotic.  Neurological:      Mental Status: She is alert.     Comments: Mildly somnolent, but responding appropriately    ED Results / Procedures / Treatments   Labs (all labs ordered are listed, but only abnormal results are displayed) Labs Reviewed  BASIC METABOLIC PANEL - Abnormal; Notable for the following components:      Result Value   Sodium 131 (*)    Chloride 91 (*)    Glucose, Bld 166 (*)    BUN 36 (*)    Creatinine, Ser 3.53 (*)    GFR, Estimated 14 (*)    All other components  within normal limits  CBC - Abnormal; Notable for the following components:   Hemoglobin 11.4 (*)    HCT 35.9 (*)    All other components within normal limits  BRAIN NATRIURETIC PEPTIDE - Abnormal; Notable for the following components:   B Natriuretic Peptide 1,102.1 (*)    All other components within normal limits  I-STAT VENOUS BLOOD GAS, ED - Abnormal; Notable for the following components:   pCO2, Ven 61.8 (*)    pO2, Ven 66.0 (*)    Bicarbonate 30.1 (*)    Sodium 130 (*)    All other components within normal limits  TROPONIN I (HIGH SENSITIVITY) - Abnormal; Notable for the following components:   Troponin I (High Sensitivity) 397 (*)    All other components within normal limits  TROPONIN I (HIGH SENSITIVITY) - Abnormal; Notable for the following components:   Troponin I (High Sensitivity) 376 (*)    All other components within normal limits  RESP PANEL BY RT-PCR (FLU A&B, COVID) [id number]  PROCALCITONIN    EKG EKG Interpretation  Date/Time:  Monday December 27 2020 06:02:35 EDT Ventricular Rate:  113 PR Interval:    QRS Duration: 98 QT Interval:  340 QTC Calculation: 466 R Axis:   -58 Text Interpretation: Multifocal atrial tachycardia Premature ventricular complexes Left axis deviation Anterolateral infarct , age undetermined Abnormal ECG When compared with ECG of 11/26/2020, HEART RATE has increased QT has shortened Premature ventricular complexes are now present Low voltage QRS is no longer present Confirmed  by Delora Fuel ([id number]) on 12/27/2020 6:32:00 AM  Radiology DG Chest 2 View  Result Date: 12/27/2020 CLINICAL DATA:  70 year old female with chest pain. EXAM: CHEST - 2 VIEW COMPARISON:  Chest radiographs 12/07/2020 and earlier. FINDINGS: Improved lung volumes. Mediastinal contours are stable with heart size at the upper limits of normal. Calcified aortic atherosclerosis. Visualized tracheal air column is within normal limits. Diffuse increased pulmonary interstitial opacity, hazy and confluent at the right lung base. No pleural effusion or pneumothorax. No consolidation. Gas-filled bowel loops in the upper abdomen have increased, nonspecific. Stable cholecystectomy clips. No acute osseous abnormality identified. IMPRESSION: Increased and asymmetric pulmonary interstitial opacity since last month, confluent at the right lung base. No pleural effusion or consolidation. Top differential considerations include asymmetric pulmonary edema and viral/atypical respiratory infection Electronically Signed   By: Genevie Ann M.D.   On: 12/27/2020 07:26    Procedures Procedures   Medications Ordered in ED Medications  sodium chloride flush (NS) 0.9 % injection 3 mL (has no administration in time range)  sodium chloride flush (NS) 0.9 % injection 3 mL (has no administration in time range)  0.9 %  sodium chloride infusion (has no administration in time range)  acetaminophen (TYLENOL) tablet 650 mg (has no administration in time range)  heparin injection 5,000 Units (has no administration in time range)  furosemide (LASIX) injection 40 mg (has no administration in time range)  albuterol (PROVENTIL) (2.5 MG/3ML) 0.083% nebulizer solution 5 mg (5 mg Nebulization Given 12/27/20 0857)  ipratropium (ATROVENT) nebulizer solution 0.5 mg (0.5 mg Nebulization Given 12/27/20 0857)    ED Course  I have reviewed the triage vital signs and the nursing notes.  Pertinent labs & imaging results that were available during my care  of the patient were reviewed by me and considered in my medical decision making (see chart for details).    MDM Rules/Calculators/A&P  This is a 70 year old female with hx of HTN, HFpEF, CKD, DM2, CAD, and PVD presenting in acute respiratory failure.  She reports 2-3 days of midsternal chest pain and associated dyspnea that has been worsening over the past 24 hours. She has a history of similar presentation requiring admission for acute hypoxemic hypercarbic respiratory failure in May and August of this year, which were ultimately thought to be related to volume overload and pulmonary edema.  Today she is afebrile but tachycardic and hypertensive on arrival. Initial RR 18 and SpO2 92%, but patient's respiratory status began decompensating while in the waiting room.  O2 sats found to be in the 70s on 4L [place]. She was briefly placed on non-rebreather but sats remained in the low 80s with RR 27-30. Patient then started on BiPap with improvement. VBG showed pH 7.295, pCO2 61.8 bicarb 30, pO2 66.  Differential includes acute CHF exacerbation, ACS, pneumonia, COVID, less likely PE. Remainder of workup remarkable for troponin 397, BNP 1102, and CXR with increased interstitial opacity c/f asymmetric pulmonary edema vs atypical infection.  Upon re-eval patient much improved on BiPap. Now speaking in full sentences, SpO2 100%, RR 19. Discussed case with hospitalist, who accepts patient for admission for presumed CHF exacerbation.   Final Clinical Impression(s) / ED Diagnoses Final diagnoses:  Acute respiratory failure with hypoxia and hypercapnia ([place])    Rx / DC Orders ED Discharge Orders     None      Alcus Dad, MD PGY-2 [facility]   Alcus Dad, MD 12/27/20 [place], [place], MD 12/27/20 1152

## 2020-12-27 NOTE — ED Notes (Signed)
Critical lab Troponin 372 called from lab

## 2020-12-27 NOTE — Consult Note (Signed)
Cardiology Consultation:   Patient ID: CHENE GREG MRN: [id number]; DOB: 1951-01-20  Admit date: 12/27/2020 Date of Consult: 12/27/2020  PCP:  [name], MD   [facility] Providers Cardiologist:  Jenne Campus, MD   Patient Profile:   Molly [name] is a 70 y.o. female with a history of chronic diastolic CHF, PAD with CTO of bilateral SFAs , renal artery stenosis, uncontrolled, hypertension, hyperlipidemia, type 2 diabetes mellitus, CKD stage IV, who is being seen for the evaluation of CHF at the request of Dr. Tamala Julian.  History of Present Illness:   Molly [name] is a 71 year old female with the above history who is followed by Dr. Agustin Cree as well as Dr. Gwenlyn Found for PAD.  Patient underwent peripheral angiography in 08/2019 which showed CTO of bilateral SFAs as well as occluded of popliteal and tibial vessels on the left and tibial disease on the right.  Also found to have a 90% left renal artery stenosis. Intervention was attempted but was unsuccessful.  Patient was started on Pletal with marked improvement in claudication.  Dopplers in 09/2019 revealed normal renal pole to pole with dimensions suggesting that the 90% left renal artery stenosis on angiography was not physiologically significant.  Patient has had a couple of hospitalizatio over the last few months for hypertensive urgency acute on chronic diastolic dysfunction.  Patient was recently admitted 11/26/2020 to 12/03/2020 with acute hypoxic and hypercapnic respiratory failure secondary to acute on chronic diastolic CHF.  Patient markedly hypertensive at 210/90 on arrival.  She briefly required BiPAP on admission.  BNP was elevated at 1,695.  D-dimer elevated at 1.96.  VQ scan and Dopplers were negative for PE and DVT.  Echo showed LVEF of 60 to 65% with normal wall motion and grade 2 diastolic dysfunction.  RV normal.  Nephrology was consulted to assist with diaphoresis given CKD stage IV and then she was transitioned back to PO  Lasix 40 mg.  Patient was discharged on Lasix 40 mg daily, Amlodipine 10 mg daily, Hydralazine 25 mg every 12 hours.  Coreg was discontinued due to bradycardia.  He was discharged with instructions to follow-up with Cardiology and Nephrology.  Patient presented back to the ED on 12/27/2020 with chest pain.  Patient reports substernal nonradiating chest tightness for the last 3 days prior to presentation.  Pain was constant and occurred at rest. Not worse with exertion. She also notes dyspnea at rest and describes orthopnea and PND for the last day or two. She states she could not lay down and get comfortably due to her breathing. She denies any worsening of her chest pain when laying flat and no pleuritic pain. No lower extremity edema. She also notes nausea the day or so and one episode of vomiting when she arrived to the ED. No diaphoresis. Pain finally improved when she was started on O2 in the ED. No other recent fevers or illnesses. No cough or nasal congestion. She states she has been compliant with her medications at home. She has a pulse O2 machine and states O2 sats are usually in the low 90s. She also monitors her BP at home and states systolic BP normally in the 120s to 130s. She states she is still able to make urine. She notes one episode of few weeks ago where she noticed blood after urinating but this was an isolated event. No other abnormal bleeding in urine or stools. She does does describe significant itching over the last couple of weeks.  In the ED,  markedly hypertensive with BP of 234/208, tachycardic, and hypoxic with O2 sats of 78% on room air.  She was started on BiPAP with improvement and was unable to be weaned back to 4 L via nasal cannula.  EKG showed multifocal atrial tachycardia, rate 113 bpm, with no acute ST/T changes. High-sensitivity troponin elevated at 397 >> 376.  BNP elevated at 1,102.  Chest x-ray showed increase in asymmetric pulmonary interstitial opacity since last month  confluent at the right lung base. WBC, 8.8, Hgb 11.4, Plts 369. Na 131, K 3.8, Glucose 166, BUN 36, Cr 3.53. Venous blood gas showed pH of 7.295, pCO2 61.8, pO2 66, BiCarb 30.  Respiratory panel negative for COVID and influenza A/B.  Patient closed admitted and started on IV Lasix as well as IV Rocephin for possible pneumonia. Also received breathing treatment. Cardiology consulted for assistance.  At the time of this evaluation, patient resting comfortably in no acute distress. She states she feel a lot better but has not urinated since dose of IV Lasix.  Past Medical History:  Diagnosis Date   Abdominal aortic atherosclerosis with stenosis 06/29/2015   AKI (acute kidney injury) ([place]) 06/29/2015   Atherosclerosis of coronary artery without angina pectoris 04/21/2020   Atopic dermatitis 04/21/2020   Benign hypertension with CKD (chronic kidney disease) stage III ([place]) 04/21/2020   Bilateral impacted cerumen 08/21/2019   Chest pain 08/31/2020   Cholecystitis 06/29/2015   Cholelithiasis 06/29/2015   Claudication in peripheral vascular disease ([place]) 08/28/2019   Peripheral arterial disease   Colon cancer screening 04/21/2020   Dehydration with hyponatremia 06/29/2015   Diabetes mellitus ([place]) 07/15/2018   Diarrhea 04/21/2020   DKA (diabetic ketoacidoses) 06/29/2015   Dyslipidemia 08/30/2018   Elevated troponin    Epigastric pain 04/21/2020   Gastroesophageal reflux disease 04/21/2020   HTN (hypertension) 06/29/2015   Hyperglycemia due to type 2 diabetes mellitus ([place]) 04/21/2020   Hypertension    Hypertensive emergency 07/12/2020   Hypertensive heart disease without congestive heart failure 04/21/2020   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   Proptosis 04/21/2020   Proximal leg weakness 09/10/2018   Ptosis of left eyelid 09/10/2018   Renal artery stenosis ([place]) 12/17/2019   Renal artery stenosis   Standard chest x-ray abnormal 04/21/2020   Vitamin D deficiency 04/21/2020   Weight loss 04/21/2020    Past Surgical History:  Procedure Laterality Date   ABDOMINAL AORTOGRAM W/LOWER EXTREMITY N/A 08/28/2019   Procedure: ABDOMINAL AORTOGRAM W/LOWER EXTREMITY;  Surgeon: Lorretta Harp, MD;  Location: [facility];  Service: Cardiovascular;  Laterality: N/A;   CATARACT EXTRACTION, BILATERAL     CHOLECYSTECTOMY N/A 06/30/2015   Procedure: LAPAROSCOPIC CHOLECYSTECTOMY WITH INTRAOPERATIVE CHOLANGIOGRAM;  Surgeon: Donnie Mesa, MD;  Location: [facility];  Service: General;  Laterality: N/A;   PERIPHERAL VASCULAR BALLOON ANGIOPLASTY Right 08/28/2019   Procedure: PERIPHERAL VASCULAR BALLOON ANGIOPLASTY;  Surgeon: Lorretta Harp, MD;  Location: [facility];  Service: Cardiovascular;  Laterality:  Right;  attempted SFA     Home Medications:  Prior to Admission medications   Medication Sig Start Date End Date Taking? Authorizing Provider  amLODipine (NORVASC) 10 MG tablet Take 1 tablet (10 mg total) by mouth daily. 12/03/20  Yes Domenic Polite, MD  Cholecalciferol 25 MCG (1000 UT) tablet Take 2,000 Units by mouth daily.   Yes [provider]  cilostazol (PLETAL) 50 MG tablet Take 1 tablet (50 mg total) by mouth 2 (two) times daily. 03/15/20  Yes Lorretta Harp, MD  coconut oil OIL Apply 1 application topically as needed (itching).   Yes [provider]  ezetimibe (ZETIA) 10 MG tablet Take 1 tablet (10 mg total) by mouth daily. 10/28/20 07/25/21 Yes  Park Liter, MD  famotidine (PEPCID) 20 MG tablet Take 20 mg by mouth in the morning and at bedtime.   Yes [provider]  furosemide (LASIX) 40 MG tablet Take 1 tablet (40 mg total) by mouth daily. 12/03/20  Yes Domenic Polite, MD  hydrALAZINE (APRESOLINE) 25 MG tablet Take 1 tablet (25 mg total) by mouth every 12 (twelve) hours. 10/28/20 07/25/21 Yes Park Liter, MD  hydrocortisone cream 1 % Apply 1 application topically 4 (four) times daily as needed for itching.   Yes [provider]  insulin isophane & regular human KwikPen (HUMULIN 70/30 MIX) (70-30) 100 UNIT/ML KwikPen Inject 8-12 Units into the skin daily. 12/03/20  Yes Domenic Polite, MD  ketoconazole (NIZORAL) 2 % cream Apply 1 application topically daily as needed for irritation.  05/08/19  Yes [provider]  levocetirizine (XYZAL) 5 MG tablet Take 5 mg by mouth daily as needed for allergies. 05/12/19  Yes [provider]  loperamide (IMODIUM A-D) 2 MG tablet Take 2 mg by mouth 4 (four) times daily as needed for diarrhea or loose stools.   Yes [provider]  Pediatric Multivitamins-Iron (FLINTSTONES COMPLETE) 18 MG CHEW Chew 1 tablet by mouth daily. Unknown strength   Yes [provider]  rosuvastatin (CRESTOR) 40 MG tablet TAKE 1 TABLET(40 MG) BY MOUTH AT BEDTIME Patient taking differently: Take 40 mg by mouth daily. 05/10/20  Yes Park Liter, MD  triamcinolone cream (KENALOG) 0.1 % Apply 1 application topically daily as needed (spots on legs).    Yes [provider]    Inpatient Medications: Scheduled Meds:  amLODipine  10 mg Oral Daily   cilostazol  50 mg Oral BID   ezetimibe  10 mg Oral Daily   famotidine  20 mg Oral BID   furosemide  40 mg Intravenous BID   heparin  5,000 Units Subcutaneous Q8H   hydrALAZINE  25 mg Oral Q12H   insulin aspart  0-6 Units Subcutaneous TID WC   [START ON 12/28/2020] insulin aspart protamine- aspart  8 Units  Subcutaneous Q breakfast   rosuvastatin  40 mg Oral Daily   sodium chloride flush  3 mL Intravenous Q12H   Continuous Infusions:  sodium chloride     cefTRIAXone (ROCEPHIN)  IV 2 g (12/27/20 1611)   PRN Meds: sodium chloride, acetaminophen, hydrocortisone cream, loperamide, loratadine, sodium chloride flush  Allergies:    Allergies  Allergen Reactions   Lisinopril Other (See Comments)   Mercury Nausea And Vomiting    Social History:   Social History   Socioeconomic History   Marital status: Married    Spouse name: Not on file   Number of children: Not on file   Years of education: Not on file  Highest education level: Not on file  Occupational History   Not on file  Tobacco Use   Smoking status: Never   Smokeless tobacco: Never  Vaping Use   Vaping Use: Never used  Substance and Sexual Activity   Alcohol use: No   Drug use: Never   Sexual activity: Not on file  Other Topics Concern   Not on file  Social History Narrative   Not on file   Social Determinants of Health   Financial Resource Strain: Not on file  Food Insecurity: Not on file  Transportation Needs: Not on file  Physical Activity: Not on file  Stress: Not on file  Social Connections: Not on file  Intimate Partner Violence: Not on file    Family History:    Family History  Problem Relation Age of Onset   Breast cancer Mother    Hypertension Mother    Hypertension Father    Pancreatic cancer Other        uncle     ROS:  Please see the history of present illness.  Review of Systems  Constitutional:  Negative for chills, diaphoresis and fever.  HENT:  Negative for congestion.   Respiratory:  Positive for shortness of breath. Negative for cough and sputum production.   Cardiovascular:  Positive for chest pain, orthopnea and PND. Negative for palpitations and leg swelling.  Gastrointestinal:  Positive for nausea and vomiting. Negative for blood in stool and melena.  Genitourinary:  Positive  for hematuria.  Musculoskeletal:  Negative for myalgias.  Skin:  Positive for itching.  Neurological:  Negative for dizziness and loss of consciousness.  Endo/Heme/Allergies:  Does not bruise/bleed easily.  Psychiatric/Behavioral:  Negative for substance abuse.     Physical Exam/Data:   Vitals:   12/27/20 1515 12/27/20 1530 12/27/20 1545 12/27/20 1613  BP: (!) 115/56 (!) 120/55 (!) 112/51   Pulse: 94 97 95   Resp: '18 18 19   '$ Temp:    98.8 F (37.1 C)  TempSrc:    Oral  SpO2: 100% 92% 95%   Weight:      Height:       No intake or output data in the 24 hours ending 12/27/20 1705 Last 3 Weights 12/27/2020 12/14/2020 12/03/2020  Weight (lbs) 147 lb 142 lb 140 lb 10.5 oz  Weight (kg) 66.679 kg 64.411 kg 63.8 kg     Body mass index is 26.89 kg/m.  General: 70 y.o. female resting comfortably in no acute distress. HEENT: Normocephalic and atraumatic. Sclera clear.  Neck: Supple. No carotid bruits. No JVD. Heart: RRR. Distinct S1 and S2. No murmurs, gallops, or rubs. Radial pulses 2+ and equal bilaterally. Lungs: No increased work of breathing. Crackles in bilateral bases (left > right). Abdomen: Soft, non-distended, and non-tender to palpation. Bowel sounds present. Extremities: No lower extremity edema.    Skin: Warm and dry. Neuro: Alert and oriented x3. No focal deficits. Psych: Normal affect. Responds appropriately.   EKG:  The EKG was personally reviewed and demonstrates: Multifocal atrial tachycardia, rate 113 bpm, with no acute ST/T changes. Left axis deviation. QTc 466 ms.  Telemetry:  Telemetry was personally reviewed and demonstrates: Normal sinus rhythm with rates in the 90s to 120s.  Relevant CV Studies:  Renal Artery Ultrasound 09/02/2020: Summary:  - Right: Upper range 1-59% stenosis of the right renal artery by velocities of visualized segments. RRV flow present.  - Left:  No evidence of hemodynamically significant left renal artery stenosis. LRV flow present.  Abnormal size for the left kidney.  _______________  Echocardiogram 11/27/2020: Impressions: 1. Left ventricular ejection fraction, by estimation, is 60 to 65%. The  left ventricle has normal function. The left ventricle has no regional  wall motion abnormalities. There is mild left ventricular hypertrophy.  Left ventricular diastolic parameters  are consistent with Grade II diastolic dysfunction (pseudonormalization).  Elevated left atrial pressure.   2. Right ventricular systolic function is normal. The right ventricular  size is normal. There is normal pulmonary artery systolic pressure.   3. A small pericardial effusion is present.   4. The mitral valve is normal in structure. No evidence of mitral valve  regurgitation. No evidence of mitral stenosis.   5. The aortic valve is tricuspid. Aortic valve regurgitation is not  visualized. Mild aortic valve sclerosis is present, with no evidence of  aortic valve stenosis.   6. The inferior vena cava is normal in size with greater than 50%  respiratory variability, suggesting right atrial pressure of 3 mmHg.  _______________  Lower Extremity Dopplers 11/30/2020: Summary:  RIGHT:  - There is no evidence of deep vein thrombosis in the lower extremity.  - No cystic structure found in the popliteal fossa.     LEFT:  - There is no evidence of deep vein thrombosis in the lower extremity.  - No cystic structure found in the popliteal fossa.   Laboratory Data:  High Sensitivity Troponin:   Recent Labs  Lab 11/29/20 0548 12/27/20 0640 12/27/20 0835  TROPONINIHS 162* 397* 376*     Chemistry Recent Labs  Lab 12/27/20 0640 12/27/20 0847  NA 131* 130*  K 3.8 3.8  CL 91*  --   CO2 26  --   GLUCOSE 166*  --   BUN 36*  --   CREATININE 3.53*  --   CALCIUM 9.7  --   GFRNONAA 14*  --   ANIONGAP 14  --     No results for input(s): PROT, ALBUMIN, AST, ALT, ALKPHOS, BILITOT in the last 168 hours. Hematology Recent Labs  Lab  12/27/20 0640 12/27/20 0847  WBC 8.8  --   RBC 4.11  --   HGB 11.4* 12.9  HCT 35.9* 38.0  MCV 87.3  --   MCH 27.7  --   MCHC 31.8  --   RDW 14.4  --   PLT 369  --    BNP Recent Labs  Lab 12/27/20 0835  BNP 1,102.1*    DDimer No results for input(s): DDIMER in the last 168 hours.   Radiology/Studies:  DG Chest 2 View  Result Date: 12/27/2020 CLINICAL DATA:  70 year old female with chest pain. EXAM: CHEST - 2 VIEW COMPARISON:  Chest radiographs 12/07/2020 and earlier. FINDINGS: Improved lung volumes. Mediastinal contours are stable with heart size at the upper limits of normal. Calcified aortic atherosclerosis. Visualized tracheal air column is within normal limits. Diffuse increased pulmonary interstitial opacity, hazy and confluent at the right lung base. No pleural effusion or pneumothorax. No consolidation. Gas-filled bowel loops in the upper abdomen have increased, nonspecific. Stable cholecystectomy clips. No acute osseous abnormality identified. IMPRESSION: Increased and asymmetric pulmonary interstitial opacity since last month, confluent at the right lung base. No pleural effusion or consolidation. Top differential considerations include asymmetric pulmonary edema and viral/atypical respiratory infection Electronically Signed   By: Genevie Ann M.D.   On: 12/27/2020 07:26     Assessment and Plan:   Acute Hypoxic Respiratory Faliure Acute on Chronic Diastolic CHF - Patient recently admitted  last month for acute respiratory failure secondary to acute on chronic diastolic CHF. Now presents with chest pain. Patient became markedly hypertensive in the ED and had drop in O2 sats to the 70s briefly requiring BiPAP. Possible flash pulmonary edema. - BNP elevated at 1,102. - Chest x-ray showed increased and asymmetric pulmonary interstitial opacity since last month, confluent at the right base felt to represent asymmetric pulmonary edema or viral/atypical respiratory infection. - Echo on  11/27/2020 showed LVEF of 60-65% with normal wall motion, mild LVH, and grade 2 diastolic dysfunction.  - Received one dose of IV Lasix '40mg'$  in the ED with no documented output. Will give another dose of IV Lasix '120mg'$  tonight and reassess in the morning. Given CKD stage V, will go ahead and consult Nephrology. - No ACEi/ARB/ARNI or MRA due to renal function. - Coreg discontinued last admission due to bradycardia. - Continue to monitor daily weight, strict I/O's, and renal function.  Chest Pain - Patient presented with constant resting chest tightness that improved when patient was started on supplemental O2. - EKG shows no acute changes. - High-sensitivity troponin mildly elevated and flat in the 300s. - Currently chest pain free. - Will repeat limited Echo to assess for wall motion abnormalities. - Suspect demand ischemia in setting of hypertensive emergency and CHF. However, patient likely does have CAD. Unfortunately, cardiac catheterization would push patient into needing dialysis. However, given recurrent admission this may be something that we need to discuss doing.  Hypertensive Emergency - BP as high as 234/208 on arrival. Improved with BiPAP and dose of IV Lasix. Most recent BP 112/51. - Continue home Amlodipine '10mg'$  daily and Hydralazine '25mg'$  twice daily.  Hyperlipidemia - Continue Crestor '40mg'$  daily and Zetia '10mg'$  daily.  Type 2 Diabetes Mellitus - Management per primary team.  CKD Stage IV-V - Creatinine 3.53 on admission. Baseline 2.4 to 1.7 earlier this year in 08/2020 but has trended up over the last few months especially during recent admission. Creatinine was 3.35 on recent discharge. - Continue to avoid Nephrotoxic agents.  - Will go ahead an consult Nephrology.  PAD - History of known CTO of bilateral SFAs with occluded of popliteal and tibial vessels on the left and tibial disease on the right. - No claudication. - Continue Pletal '50mg'$  twice daily. - Continue  high-intensity statin.  Renal Artery Stenosis - Renal dopplers in 08/2020 showed 1-59% stenosis of right renal artery and no significant disease on the left. However, noted to have 90% left renal artery stenosis on peripheral angiogram in 08/2019.  - Continue antiplatelet for PAD as above. - Continue statin. - May need to consider re-evaluation given discrepancy on peripheral angiogram and renal dopplers.    Risk Assessment/Risk Scores:  [id number]}   TIMI Risk Score for Unstable Angina or Non-ST Elevation MI:   The patient's TIMI risk score is 3, which indicates a 13% risk of all cause mortality, new or recurrent myocardial infarction or need for urgent revascularization in the next 14 days.{  New York Heart Association (NYHA) Functional Class NYHA Class IV.   For questions or updates, please contact [place] Please consult [email or web address] for contact info under    Signed, Darreld Mclean, PA-C  12/27/2020 5:05 PM

## 2020-12-27 NOTE — Progress Notes (Signed)
Patient refused BIPAP HS. Patient states she doesn't wear one at home. Pt on 4L [place] tolerating well no distress at this time.

## 2020-12-27 NOTE — ED Notes (Signed)
Pt placed on 2L [place] for O2 sats of 88%

## 2020-12-27 NOTE — ED Notes (Signed)
Pt's O2 was 88. Pt was placed on 2L of oxygen. Triage RN is aware.

## 2020-12-27 NOTE — ED Triage Notes (Signed)
Patient presents to ed via POV c/o chest pain onset 3 days ago , states it started with hicuups c/o nausea pain was worse today.

## 2020-12-27 NOTE — ED Notes (Signed)
Patient reported new onset of chest pain centrally and feeling "pressure". Dr. Tamala Julian notified. EKG obtained and 325 of ASA administered. Chest pain resolved very shortly after and Dr. Tamala Julian notified of same.

## 2020-12-28 ENCOUNTER — Encounter (HOSPITAL_COMMUNITY): Payer: Self-pay | Admitting: Internal Medicine

## 2020-12-28 ENCOUNTER — Inpatient Hospital Stay (HOSPITAL_COMMUNITY): Payer: Medicare Other

## 2020-12-28 DIAGNOSIS — I4891 Unspecified atrial fibrillation: Secondary | ICD-10-CM

## 2020-12-28 DIAGNOSIS — R079 Chest pain, unspecified: Secondary | ICD-10-CM

## 2020-12-28 DIAGNOSIS — J9622 Acute and chronic respiratory failure with hypercapnia: Secondary | ICD-10-CM | POA: Diagnosis not present

## 2020-12-28 DIAGNOSIS — J9621 Acute and chronic respiratory failure with hypoxia: Secondary | ICD-10-CM | POA: Diagnosis not present

## 2020-12-28 LAB — BASIC METABOLIC PANEL
Anion gap: 11 (ref 5–15)
BUN: 44 mg/dL — ABNORMAL HIGH (ref 8–23)
CO2: 31 mmol/L (ref 22–32)
Calcium: 8.7 mg/dL — ABNORMAL LOW (ref 8.9–10.3)
Chloride: 87 mmol/L — ABNORMAL LOW (ref 98–111)
Creatinine, Ser: 3.79 mg/dL — ABNORMAL HIGH (ref 0.44–1.00)
GFR, Estimated: 12 mL/min — ABNORMAL LOW (ref >60)
Glucose, Bld: 177 mg/dL — ABNORMAL HIGH (ref 70–99)
Potassium: 3.5 mmol/L (ref 3.5–5.1)
Sodium: 129 mmol/L — ABNORMAL LOW (ref 135–145)

## 2020-12-28 LAB — URINALYSIS, ROUTINE W REFLEX MICROSCOPIC
Bilirubin Urine: NEGATIVE
Glucose, UA: NEGATIVE mg/dL
Ketones, ur: NEGATIVE mg/dL
Nitrite: NEGATIVE
Protein, ur: 30 mg/dL — AB
RBC / HPF: >50 RBC/hpf — ABNORMAL HIGH (ref 0–5)
Specific Gravity, Urine: 1.01 (ref 1.005–1.030)
pH: 5 (ref 5.0–8.0)

## 2020-12-28 LAB — ECHOCARDIOGRAM LIMITED
Calc EF: 39.9 %
Height: 62 in
MV M vel: 4.75 m/s
MV Peak grad: 90.3 mmHg
Radius: 0.4 cm
S' Lateral: 2.05 cm
Single Plane A2C EF: 38.8 %
Single Plane A4C EF: 41.3 %
Weight: 2331.58 oz

## 2020-12-28 LAB — HEPARIN LEVEL (UNFRACTIONATED): Heparin Unfractionated: 0.53 IU/mL (ref 0.30–0.70)

## 2020-12-28 LAB — GLUCOSE, CAPILLARY
Glucose-Capillary: 163 mg/dL — ABNORMAL HIGH (ref 70–99)
Glucose-Capillary: 66 mg/dL — ABNORMAL LOW (ref 70–99)
Glucose-Capillary: 89 mg/dL (ref 70–99)
Glucose-Capillary: 206 mg/dL — ABNORMAL HIGH (ref 70–99)
Glucose-Capillary: 140 mg/dL — ABNORMAL HIGH (ref 70–99)

## 2020-12-28 MED ORDER — METOPROLOL TARTRATE 25 MG PO TABS
25.0000 mg | ORAL_TABLET | Freq: Four times a day (QID) | ORAL | Status: DC
  Administered 2020-12-28 – 2020-12-29 (×4): 25 mg via ORAL
  Filled 2020-12-28 (×4): qty 1

## 2020-12-28 MED ORDER — DILTIAZEM HCL 25 MG/5ML IV SOLN
10.0000 mg | Freq: Once | INTRAVENOUS | Status: AC
  Administered 2020-12-28: 10 mg via INTRAVENOUS
  Filled 2020-12-28: qty 5

## 2020-12-28 MED ORDER — AMIODARONE HCL IN DEXTROSE 360-4.14 MG/200ML-% IV SOLN
30.0000 mg/h | INTRAVENOUS | Status: DC
  Administered 2020-12-28 – 2020-12-29 (×2): 30 mg/h via INTRAVENOUS
  Filled 2020-12-28: qty 200

## 2020-12-28 MED ORDER — AMIODARONE HCL IN DEXTROSE 360-4.14 MG/200ML-% IV SOLN
60.0000 mg/h | INTRAVENOUS | Status: DC
  Administered 2020-12-28: 60 mg/h via INTRAVENOUS
  Filled 2020-12-28 (×2): qty 200

## 2020-12-28 MED ORDER — HEPARIN (PORCINE) 25000 UT/250ML-% IV SOLN
1000.0000 [IU]/h | INTRAVENOUS | Status: DC
  Administered 2020-12-28 – 2020-12-29 (×2): 1000 [IU]/h via INTRAVENOUS
  Filled 2020-12-28 (×2): qty 250

## 2020-12-28 MED ORDER — AMIODARONE LOAD VIA INFUSION
150.0000 mg | Freq: Once | INTRAVENOUS | Status: AC
  Administered 2020-12-28: 150 mg via INTRAVENOUS
  Filled 2020-12-28: qty 83.34

## 2020-12-28 MED ORDER — FAMOTIDINE 20 MG PO TABS
20.0000 mg | ORAL_TABLET | Freq: Every day | ORAL | Status: DC
  Administered 2020-12-29 – 2021-01-17 (×15): 20 mg via ORAL
  Filled 2020-12-28 (×17): qty 1

## 2020-12-28 MED ORDER — APIXABAN 5 MG PO TABS: 5.0000 mg | ORAL_TABLET | Freq: Two times a day (BID) | ORAL | Status: DC

## 2020-12-28 MED ORDER — DILTIAZEM HCL-DEXTROSE 125-5 MG/125ML-% IV SOLN (PREMIX)
5.0000 mg/h | INTRAVENOUS | Status: DC
  Administered 2020-12-28: 5 mg/h via INTRAVENOUS
  Filled 2020-12-28: qty 125

## 2020-12-28 MED ORDER — HEPARIN BOLUS VIA INFUSION
3000.0000 [IU] | Freq: Once | INTRAVENOUS | Status: AC
  Administered 2020-12-28: 3000 [IU] via INTRAVENOUS
  Filled 2020-12-28: qty 3000

## 2020-12-28 NOTE — Progress Notes (Signed)
ANTICOAGULATION CONSULT NOTE - Initial Consult  Pharmacy Consult for Heparin Indication: atrial fibrillation  Allergies  Allergen Reactions   Lisinopril Other (See Comments)   Mercury Nausea And Vomiting    Patient Measurements: Height: '5\' 2"'$  (157.5 cm) Weight: 66.1 kg (145 lb 11.6 oz) IBW/kg (Calculated) : 50.1  Vital Signs: Temp: 98.6 F (37 C) (09/13 0620) Temp Source: Oral (09/13 0620) BP: 127/75 (09/13 0620) Pulse Rate: 152 (09/13 0620)  Labs: Recent Labs    12/27/20 0640 12/27/20 0835 12/27/20 0847 12/28/20 0336  HGB 11.4*  --  12.9  --   HCT 35.9*  --  38.0  --   PLT 369  --   --   --   CREATININE 3.53*  --   --  3.79*  TROPONINIHS 397* 376*  --   --     Estimated Creatinine Clearance: 12.7 mL/min (A) (by C-G formula based on SCr of 3.79 mg/dL (H)).   Medical History: Past Medical History:  Diagnosis Date   Abdominal aortic atherosclerosis with stenosis 06/29/2015   AKI (acute kidney injury) ([place]) 06/29/2015   Atherosclerosis of coronary artery without angina pectoris 04/21/2020   Atopic dermatitis 04/21/2020   Benign hypertension with CKD (chronic kidney disease) stage III ([place]) 04/21/2020   Bilateral impacted cerumen 08/21/2019   Chest pain 08/31/2020   Cholecystitis 06/29/2015   Cholelithiasis 06/29/2015   Claudication in peripheral vascular disease ([place]) 08/28/2019   Peripheral arterial disease   Colon cancer screening 04/21/2020   Dehydration with hyponatremia 06/29/2015   Diabetes mellitus ([place]) 07/15/2018   Diarrhea 04/21/2020   DKA (diabetic ketoacidoses) 06/29/2015   Dyslipidemia 08/30/2018   Elevated troponin    Epigastric pain 04/21/2020   Gastroesophageal reflux disease 04/21/2020   HTN (hypertension) 06/29/2015   Hyperglycemia due to type 2 diabetes mellitus ([place]) 04/21/2020   Hypertension    Hypertensive emergency 07/12/2020   Hypertensive heart disease without congestive heart failure 04/21/2020   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   Proptosis 04/21/2020   Proximal leg weakness 09/10/2018   Ptosis of left eyelid 09/10/2018   Renal artery stenosis ([place]) 12/17/2019   Renal artery stenosis   Standard chest x-ray abnormal 04/21/2020   Vitamin D deficiency 04/21/2020   Weight loss 04/21/2020    Medications:  Medications Prior to Admission  Medication Sig Dispense Refill Last Dose   amLODipine (NORVASC) 10 MG tablet Take 1 tablet (10 mg total) by mouth daily. 30 tablet 0 12/25/2020   Cholecalciferol 25 MCG (1000 UT) tablet Take 2,000 Units by mouth daily.   12/25/2020   cilostazol (PLETAL) 50 MG tablet Take 1 tablet (50 mg total) by mouth 2 (two) times daily. 180 tablet 3 12/26/2020   coconut oil OIL Apply 1 application topically as needed (itching).   unknown   ezetimibe (ZETIA) 10 MG tablet Take 1 tablet (10 mg total) by mouth daily. 90 tablet 2 12/25/2020   famotidine (PEPCID) 20 MG tablet Take 20 mg by  mouth in the morning and at bedtime.   12/26/2020   furosemide (LASIX) 40 MG tablet Take 1 tablet (40 mg total) by mouth daily. 30 tablet 0 12/25/2020   hydrALAZINE (APRESOLINE) 25 MG tablet Take 1 tablet (25 mg total) by mouth every 12 (twelve) hours. 180 tablet 2 12/26/2020   hydrocortisone cream 1 % Apply 1 application topically 4 (four) times daily as needed for itching.   unknown   insulin isophane & regular human KwikPen (HUMULIN 70/30 MIX) (70-30) 100 UNIT/ML KwikPen Inject 8-12 Units into the skin daily.    12/25/2020   ketoconazole (NIZORAL) 2 % cream Apply 1 application topically daily as needed for irritation.    unknown   levocetirizine (XYZAL) 5 MG tablet Take 5 mg by mouth daily as needed for allergies.   unknown   loperamide (IMODIUM A-D) 2 MG tablet Take 2 mg by mouth 4 (four) times daily as needed for diarrhea or loose stools.   unknown   Pediatric Multivitamins-Iron (FLINTSTONES COMPLETE) 18 MG CHEW Chew 1 tablet by mouth daily. Unknown strength   12/25/2020   rosuvastatin (CRESTOR) 40 MG tablet TAKE 1 TABLET(40 MG) BY MOUTH AT BEDTIME (Patient taking differently: Take 40 mg by mouth daily.) 90 tablet 1 12/26/2020   triamcinolone cream (KENALOG) 0.1 % Apply 1 application topically daily as needed (spots on legs).    unknown    Assessment: 70 y.o. female with new onset Afib for heparin Goal of Therapy:  Heparin level 0.3-0.7 units/ml Monitor platelets by anticoagulation protocol: Yes   Plan:  Heparin 3000 units IV bolus, then start heparin 1000 units/hr Check heparin level in 8 hours.   Caryl Pina 12/28/2020,6:38 AM

## 2020-12-28 NOTE — Progress Notes (Signed)
Patient in A.fib with RVR. HR 165. Patient complaining of SOB and chest pain. Cardizem drip and heparin infusing. Cardiologist at bedside. Received an new order for Amiodarone drip. Will continue to monitor patient.

## 2020-12-28 NOTE — Progress Notes (Signed)
ANTICOAGULATION CONSULT NOTE  Pharmacy Consult for Heparin Indication: atrial fibrillation  Allergies  Allergen Reactions   Lisinopril Other (See Comments)   Mercury Nausea And Vomiting    Patient Measurements: Height: '5\' 2"'$  (157.5 cm) Weight: 66.1 kg (145 lb 11.6 oz) IBW/kg (Calculated) : 50.1  Vital Signs: Temp: 98.9 F (37.2 C) (09/13 1126) Temp Source: Oral (09/13 1126) BP: 117/68 (09/13 1126) Pulse Rate: 81 (09/13 1126)  Labs: Recent Labs    12/27/20 0640 12/27/20 0835 12/27/20 0847 12/28/20 0336 12/28/20 1422  HGB 11.4*  --  12.9  --   --   HCT 35.9*  --  38.0  --   --   PLT 369  --   --   --   --   HEPARINUNFRC  --   --   --   --  0.53  CREATININE 3.53*  --   --  3.79*  --   TROPONINIHS 397* 376*  --   --   --      Estimated Creatinine Clearance: 12.7 mL/min (A) (by C-G formula based on SCr of 3.79 mg/dL (H)).   Assessment: 70 y.o. female with new onset Afib to continue on IV heparin.  Heparin level is therapeutic; no bleeding reported.  Goal of Therapy:  Heparin level 0.3-0.7 units/ml Monitor platelets by anticoagulation protocol: Yes   Plan:  Continue heparin gtt at 1000 units/hr Daily heparin level and CBC  Desarie Feild D. Mina Marble, PharmD, BCPS, [place] 12/28/2020, 3:17 PM

## 2020-12-28 NOTE — Progress Notes (Signed)
  CBG 66. Hypoglycemia protocol initiated. Recheck CBG was 89. MD made aware.

## 2020-12-28 NOTE — Consult Note (Signed)
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 Increase in Cr today could also be related to diminished renal perfusion 2/2 to RAS and Afib w/ RVR -Follows w/ Dr. Neta Ehlers, last known Cr as outpatient~2.6 on 10/15/2020. Was recently discharged with a Cr of 3.4 -see below for RAS plan. Avoid RAAS blockers or MRA's -agree with holding lasix for today especially given improvement in resp status, would have a low threshold to diurese -no indications for renal replacement therapy currently -Continue to monitor daily Cr, Dose meds for GFR<15 -Monitor Daily I/Os, Daily weight  -Maintain MAP>65 for optimal renal perfusion.  -Agree with holding ACE-I, avoid further nephrotoxins including NSAIDS, Morphine.  Unless absolutely necessary, avoid CT with contrast and/or MRI with gadolinium.     Left renal artery stenosis (80-90% proximal left renal artery stenosis) -as resp status and Afib improve, considering CO2 and stent of left renal artery, tentatively planned for tomorrow  AHRF, acute on chronic dCHF exacerbation -possibly flash pulm edema, lasix currently on hold given rise in Cr  Hypertension: -now more so normotensive. On norvasc, metoprolol, and hydralazine  Hyponatremia -likely worsened with lasix and due to AKI -monitor for now  Afib w/ RVR -on amio and hep gtt per cardiology  Anemia due to chronic kidney disease: -hgb at goal -Transfuse for Hgb<7 g/dL  Diabetes Mellitus Type 2 with Hyperglycemia -per primary   [facility] 12/28/2020 3:56 PM   _____________________________________________________________________________________   History of Present Illness: Molly [name] is a/an 70 y.o. female with a past medical history of CKD 3-4, hypertension, left renal artery stenosis, diastolic CHF, PAD, chronic anemia, DM2 who presents to [facility] with  chest pain and shortness of breath.  Found to be in hypertensive crisis with a blood pressure up to 234/208 on admission.  She did require BiPAP briefly for flash pulmonary edema.  She developed A. fib with RVR 9/13. Chest x-ray did show increased in asymmetric pulmonary interstitial opacities since last month possible asymmetric pulmonary edema versus viral/atypical respiratory infection (COVID and flu negative). Was recently admitted for AHRF, dCHF, HTN urgency. Follows with a nephrology-[facility]: Dr. Neta Ehlers. She currently reports that her breathing is significantly better as compared to yesterday. Has some diminished appetite due to her breathing otherwise denies any fevers, chills, chest pain, palpitations, swelling, n/v, dysgeusia, brain fog, intractable hiccups/pruritis.   Medications:  Current Facility-Administered Medications  Medication Dose Route Frequency Provider Last Rate Last Admin   0.9 %  sodium chloride infusion  250 mL Intravenous PRN Fuller Plan A, MD       acetaminophen (TYLENOL) tablet 650 mg  650 mg Oral Q4H PRN Fuller Plan A, MD   650 mg at 12/28/20 1011   amiodarone (NEXTERONE PREMIX) 360-4.14 MG/200ML-% (1.8 mg/mL) IV infusion  60 mg/hr Intravenous Continuous O'Neal, Cassie Freer, MD 33.3 mL/hr at 12/28/20 0923 60 mg/hr at 12/28/20 0923   Followed by   amiodarone (NEXTERONE PREMIX) 360-4.14 MG/200ML-% (1.8 mg/mL) IV infusion  30 mg/hr Intravenous Continuous Geralynn Rile, MD 16.67 mL/hr at 12/28/20 1526 30 mg/hr at 12/28/20 1526   amLODipine (NORVASC) tablet 10 mg  10 mg Oral Daily Fuller Plan A, MD   10 mg at 12/28/20 0823   cefTRIAXone (ROCEPHIN) 2 g in sodium chloride 0.9 % 100 mL IVPB  2 g Intravenous Q24H Tamala Julian, Rondell A, MD 200 mL/hr at 12/28/20 1525 2 g at 12/28/20  1525   cilostazol (PLETAL) tablet 50 mg  50 mg Oral BID Fuller Plan A, MD   50 mg at 12/28/20 0824   ezetimibe (ZETIA) tablet 10 mg  10 mg Oral Daily Fuller Plan A, MD    10 mg at 12/28/20 0823   famotidine (PEPCID) tablet 20 mg  20 mg Oral BID Fuller Plan A, MD   20 mg at 12/28/20 0823   heparin ADULT infusion 100 units/mL (25000 units/264m)  1,000 Units/hr Intravenous Continuous GNicole KindredA, DO 10 mL/hr at 12/28/20 0655 1,000 Units/hr at 12/28/20 [id number]  hydrALAZINE (APRESOLINE) tablet 25 mg  25 mg Oral Q12H Smith, Rondell A, MD   25 mg at 12/28/20 [id number]  hydrocortisone cream 1 % 1 application  1 application Topical QID PRN SFuller PlanA, MD       insulin aspart (novoLOG) injection 0-6 Units  0-6 Units Subcutaneous TID WC SFuller PlanA, MD   1 Units at 12/28/20 [id number]  insulin aspart protamine- aspart (NOVOLOG MIX 70/30) injection 8 Units  8 Units Subcutaneous Q breakfast SFuller PlanA, MD   8 Units at 12/28/20 [id number]  loperamide (IMODIUM) capsule 2 mg  2 mg Oral QID PRN SNorval Morton MD       loratadine (CLARITIN) tablet 5 mg  5 mg Oral Daily PRN SFuller PlanA, MD       metoprolol tartrate (LOPRESSOR) tablet 25 mg  25 mg Oral Q6H O'Neal, WCassie Freer MD   25 mg at 12/28/20 [id number]  rosuvastatin (CRESTOR) tablet 40 mg  40 mg Oral Daily SFuller PlanA, MD   40 mg at 12/28/20 [id number]  sodium chloride flush (NS) 0.9 % injection 3 mL  3 mL Intravenous Q12H Smith, Rondell A, MD   3 mL at 12/28/20 0824   sodium chloride flush (NS) 0.9 % injection 3 mL  3 mL Intravenous PRN SNorval Morton MD         ALLERGIES Lisinopril and Mercury  MEDICAL HISTORY Past Medical History:  Diagnosis Date   Abdominal aortic atherosclerosis with stenosis 06/29/2015   [place] 06/[place]/2015   Atherosclerosis of coronary artery without angina pectoris 04/21/2020   Atopic dermatitis 04/21/2020   Benign hypertension with CKD (chronic [place] disease) stage III [place] 04/21/2020   Bilateral impacted cerumen 08/21/2019   Chest pain 08/31/2020   Cholecystitis 06/29/2015   Cholelithiasis 06/29/2015   Claudication in peripheral vascular disease ([place]  08/28/2019   Peripheral arterial disease   Colon cancer screening 04/21/2020   Dehydration with hyponatremia 06/29/2015   Diabetes mellitus ([place] 07/15/2018   Diarrhea 04/21/2020   DKA (diabetic ketoacidoses) 06/29/2015   Dyslipidemia 08/30/2018   Elevated troponin    Epigastric pain 04/21/2020   Gastroesophageal reflux disease 04/21/2020   HTN (hypertension) 06/29/2015   Hyperglycemia due to type 2 diabetes mellitus ([place] 04/21/2020   Hypertension    Hypertensive emergency 07/12/2020   Hypertensive heart disease without congestive heart failure 04/21/2020   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[place] 04/21/2020   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   Irritable bowel syndrome with diarrhea 10/02/2018   Left-sided chest pain 04/21/2020   Leukocytosis 06/29/2015   Long term (current) use of insulin ([place] 04/21/2020   Loss of appetite 04/21/2020   Nausea 04/21/2020   Occlusion and stenosis of bilateral carotid arteries 04/21/2020   Osteopenia 10/02/2018   Other dysphagia 09/10/2018   Peripheral vascular disease ([place] 07/15/2018   Carotic arterial disease last check  in summer 2019.  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   Proptosis 04/21/2020   Proximal leg weakness 09/10/2018   Ptosis of left eyelid 09/10/2018   Renal artery stenosis ([place]) 12/17/2019   Renal artery stenosis   Standard chest x-ray abnormal 04/21/2020   Vitamin D deficiency 04/21/2020   Weight loss 04/21/2020     SOCIAL HISTORY Social History   Socioeconomic History   Marital status: Married    Spouse name: Sheetal Froelich   Number of children: 2   Years of education: Not on file   Highest education level: Bachelor's degree (e.g., BA, AB, BS)  Occupational History   Occupation: retired    Comment: Med Tech  Tobacco Use   Smoking status: Never   Smokeless tobacco: Never  Vaping Use   Vaping Use: Never used  Substance and Sexual Activity   Alcohol use: No   Drug use: Never   Sexual activity: Not on file  Other Topics  Concern   Not on file  Social History Narrative   Not on file   Social Determinants of Health   Financial Resource Strain: Low Risk    Difficulty of Paying Living Expenses: Not very hard  Food Insecurity: No Food Insecurity   Worried About Charity fundraiser in the Last Year: Never true   Ran Out of Food in the Last Year: Never true  Transportation Needs: No Transportation Needs   Lack of Transportation (Medical): No   Lack of Transportation (Non-Medical): No  Physical Activity: Not on file  Stress: Not on file  Social Connections: Not on file  Intimate Partner Violence: Not on file     FAMILY HISTORY Family History  Problem Relation Age of Onset   Breast cancer Mother    Hypertension Mother    Hypertension Father    Pancreatic cancer Other        uncle    Review of Systems: 12 systems reviewed Otherwise as per HPI, all other systems reviewed and negative  Physical Exam: Vitals:   12/28/20 1126 12/28/20 1520  BP: 117/68 (!) 101/51  Pulse: 81 73  Resp: 20 20  Temp: 98.9 F (37.2 C) 99.1 F (37.3 C)  SpO2: 98% 91%   No intake/output data recorded.  Intake/Output Summary (Last 24 hours) at 12/28/2020 1556 Last data filed at 12/28/2020 0432 Gross per 24 hour  Intake 460 ml  Output 300 ml  Net 160 ml   General: no acute distress HEENT: anicteric sclera, oropharynx clear without lesions CV: regular rate, normal rhythm, no murmurs, no gallops, no rubs Lungs: fine crackles bibasilar Abd: soft, non-tender, non-distended Skin: no visible lesions or rashes Musculoskeletal: no edema Neuro: normal speech, no gross focal deficits, no asterixis Psych: alert, engaged, appropriate mood and affect  Test Results Reviewed Lab Results  Component Value Date   NA 129 (L) 12/28/2020   K 3.5 12/28/2020   CL 87 (L) 12/28/2020   CO2 31 12/28/2020   BUN 44 (H) 12/28/2020   CREATININE 3.79 (H) 12/28/2020   GFR 41.33 (L) 06/03/2020   CALCIUM 8.7 (L) 12/28/2020   ALBUMIN  2.9 (L) 12/03/2020   PHOS 3.2 12/03/2020     I have reviewed all relevant outside healthcare records related to the patient's kidney injury.

## 2020-12-28 NOTE — Progress Notes (Addendum)
Heart Failure Nurse Navigator Progress Note  Completed interview for HV TOC readiness. Completed [facility]--no needs noted.   Continue to follow renal function. Pt spouse at bedside states possible renal stenting while inpatient, awaiting nephrology. Pt currently in NSR, on amio gtt and heparin gtt. Pt wearing 2LPM via [place], spouse asked for home oxygen--defer to unit [facility] team.   Education Assessment and Provision:  Detailed education and instructions provided on heart failure disease management including the following:  Signs and symptoms of Heart Failure When to call the physician Importance of daily weights Low sodium diet Fluid restriction Medication management Anticipated future follow-up appointments  Patient education given on each of the above topics.  Patient acknowledges understanding via teach back method and acceptance of all instructions.  Education Materials:  "Living Better With Heart Failure" Booklet, HF zone tool, & Daily Weight Tracker Tool.  Patient has scale at home: yes Patient has pill box at home: yes   Will follow this hospitalization to see progress.  If improvement will plan to bring to HV TOC.   Pricilla Holm, MSN, RN Heart Failure Nurse Navigator [phone]

## 2020-12-28 NOTE — Progress Notes (Signed)
Cardiology Progress Note  Patient ID: Molly [name] MRN: [id number] DOB: 07-09-1950 Date of Encounter: 12/28/2020  Primary Cardiologist: Jenne Campus, MD  Subjective   Chief Complaint: Shortness of breath  HPI: Developed A. fib with RVR overnight.  Reports she is short of breath.  Creatinine continues to rise.  Diuresis really inadequate.  ROS:  All other ROS reviewed and negative. Pertinent positives noted in the HPI.     Inpatient Medications  Scheduled Meds:  amiodarone  150 mg Intravenous Once   amLODipine  10 mg Oral Daily   cilostazol  50 mg Oral BID   ezetimibe  10 mg Oral Daily   famotidine  20 mg Oral BID   hydrALAZINE  25 mg Oral Q12H   insulin aspart  0-6 Units Subcutaneous TID WC   insulin aspart protamine- aspart  8 Units Subcutaneous Q breakfast   metoprolol tartrate  25 mg Oral Q6H   rosuvastatin  40 mg Oral Daily   sodium chloride flush  3 mL Intravenous Q12H   Continuous Infusions:  sodium chloride     amiodarone     Followed by   amiodarone     cefTRIAXone (ROCEPHIN)  IV Stopped (12/27/20 1813)   heparin 1,000 Units/hr (12/28/20 0655)   PRN Meds: sodium chloride, acetaminophen, hydrocortisone cream, loperamide, loratadine, sodium chloride flush   Vital Signs   Vitals:   12/28/20 0717 12/28/20 0814 12/28/20 0837 12/28/20 0900  BP: 128/89 (!) 125/93 113/79 113/71  Pulse: 71  75   Resp: 20  (!) 22   Temp:   97.7 F (36.5 C)   TempSrc:   Oral   SpO2: 94%  98%   Weight:      Height:        Intake/Output Summary (Last 24 hours) at 12/28/2020 0917 Last data filed at 12/28/2020 0432 Gross per 24 hour  Intake 460 ml  Output 300 ml  Net 160 ml   Last 3 Weights 12/28/2020 12/27/2020 12/27/2020  Weight (lbs) 145 lb 11.6 oz 145 lb 11.6 oz 147 lb  Weight (kg) 66.1 kg 66.1 kg 66.679 kg      Telemetry  Overnight telemetry shows atrial fibrillation with RVR heart rates in the 140s, which I personally reviewed.   ECG  The most recent ECG shows A.  fib with RVR heart rate 152, PVC noted, which I personally reviewed.   Physical Exam   Vitals:   12/28/20 0717 12/28/20 0814 12/28/20 0837 12/28/20 0900  BP: 128/89 (!) 125/93 113/79 113/71  Pulse: 71  75   Resp: 20  (!) 22   Temp:   97.7 F (36.5 C)   TempSrc:   Oral   SpO2: 94%  98%   Weight:      Height:        Intake/Output Summary (Last 24 hours) at 12/28/2020 0917 Last data filed at 12/28/2020 0432 Gross per 24 hour  Intake 460 ml  Output 300 ml  Net 160 ml    Last 3 Weights 12/28/2020 12/27/2020 12/27/2020  Weight (lbs) 145 lb 11.6 oz 145 lb 11.6 oz 147 lb  Weight (kg) 66.1 kg 66.1 kg 66.679 kg    Body mass index is 26.65 kg/m.   General: Ill-appearing Head: Atraumatic, normal size  Eyes: PEERLA, EOMI  Neck: Supple, no JVD Endocrine: No thryomegaly Cardiac: Normal S1, S2; irregular rhythm, no murmurs Lungs: Crackles bilaterally Abd: Soft, nontender, no hepatomegaly  Ext: No edema, pulses 2+ Musculoskeletal: No deformities, BUE and BLE strength  normal and equal Skin: Warm and dry, no rashes   Neuro: Alert and oriented to person, place, time, and situation, CNII-XII grossly intact, no focal deficits  Psych: Normal mood and affect   Labs  High Sensitivity Troponin:   Recent Labs  Lab 11/29/20 0548 12/27/20 0640 12/27/20 0835  TROPONINIHS 162* 397* 376*     Cardiac EnzymesNo results for input(s): TROPONINI in the last 168 hours. No results for input(s): TROPIPOC in the last 168 hours.  Chemistry Recent Labs  Lab 12/27/20 0640 12/27/20 0847 12/28/20 0336  NA 131* 130* 129*  K 3.8 3.8 3.5  CL 91*  --  87*  CO2 26  --  31  GLUCOSE 166*  --  177*  BUN 36*  --  44*  CREATININE 3.53*  --  3.79*  CALCIUM 9.7  --  8.7*  GFRNONAA 14*  --  12*  ANIONGAP 14  --  11    Hematology Recent Labs  Lab 12/27/20 0640 12/27/20 0847  WBC 8.8  --   RBC 4.11  --   HGB 11.4* 12.9  HCT 35.9* 38.0  MCV 87.3  --   MCH 27.7  --   MCHC 31.8  --   RDW 14.4  --   PLT  369  --    BNP Recent Labs  Lab 12/27/20 0835  BNP 1,102.1*    DDimer No results for input(s): DDIMER in the last 168 hours.   Radiology  DG Chest 2 View  Result Date: 12/27/2020 CLINICAL DATA:  70 year old female with chest pain. EXAM: CHEST - 2 VIEW COMPARISON:  Chest radiographs 12/07/2020 and earlier. FINDINGS: Improved lung volumes. Mediastinal contours are stable with heart size at the upper limits of normal. Calcified aortic atherosclerosis. Visualized tracheal air column is within normal limits. Diffuse increased pulmonary interstitial opacity, hazy and confluent at the right lung base. No pleural effusion or pneumothorax. No consolidation. Gas-filled bowel loops in the upper abdomen have increased, nonspecific. Stable cholecystectomy clips. No acute osseous abnormality identified. IMPRESSION: Increased and asymmetric pulmonary interstitial opacity since last month, confluent at the right lung base. No pleural effusion or consolidation. Top differential considerations include asymmetric pulmonary edema and viral/atypical respiratory infection Electronically Signed   By: Genevie Ann M.D.   On: 12/27/2020 07:26    Cardiac Studies  TTE 11/27/2020  1. Left ventricular ejection fraction, by estimation, is 60 to 65%. The  left ventricle has normal function. The left ventricle has no regional  wall motion abnormalities. There is mild left ventricular hypertrophy.  Left ventricular diastolic parameters  are consistent with Grade II diastolic dysfunction (pseudonormalization).  Elevated left atrial pressure.   2. Right ventricular systolic function is normal. The right ventricular  size is normal. There is normal pulmonary artery systolic pressure.   3. A small pericardial effusion is present.   4. The mitral valve is normal in structure. No evidence of mitral valve  regurgitation. No evidence of mitral stenosis.   5. The aortic valve is tricuspid. Aortic valve regurgitation is not   visualized. Mild aortic valve sclerosis is present, with no evidence of  aortic valve stenosis.   6. The inferior vena cava is normal in size with greater than 50%  respiratory variability, suggesting right atrial pressure of 3 mmHg.   Patient Profile  70 year old female with history of CKD stage IV, diastolic heart failure, PAD (bilateral SFA CTO's), left renal artery stenosis (90%), diabetes, CAD who was admitted on 12/27/2020 with chest pain and  acute hypoxic respiratory failure secondary to pulmonary edema from likely hypertensive crisis.  Course complicated by A. fib with RVR 12/28/2020.  Assessment & Plan   #A. fib with RVR -Developed overnight on 12/28/2020.  No prior history of this. -Rates not controlled on diltiazem. -We will transition to amiodarone with bolus and IV load.  Hopefully she will convert back to rhythm. -She is quite uncomfortable in A. fib with RVR.  I suspect due to her HFpEF she is having more pulmonary edema.  We will see if we get her back in rhythm.  In the interim I will add metoprolol to tartrate 25 mg every 6 hours. -Continue heparin drip.  Would hold on any DOAC as she may need renal artery stenting.  #Acute hypoxic respiratory failure #Hypertensive crisis #Recurrent flash pulmonary edema #CKD stage IV #Left renal artery stenosis #HFpEF -Admitted with severely elevated blood pressure with flash pulmonary edema.  She is had recurrent picture of this.  She does have CKD stage IV as well as HFpEF. -Abdominal aortogram last year demonstrated an 80 to 90% proximal left renal artery stenosis.  I do wonder if this is the culprit to explain her recurrent issues.  She does have poor kidney function at baseline so the normal right renal artery may not be able to contribute is much as previously thought. -I did discuss her case with vascular medicine yesterday.  We could consider CO2 and stenting of the left renal artery.  We will need to get her better from a  respiratory standpoint before we can do this. -Tentatively plan for this tomorrow.  We will see how she does and hopefully we get her back in normal rhythm.  #Elevated troponin #Demand ischemia in the setting of hypertensive crisis #Coronary calcifications on prior chest CT -Troponin elevation was minimal and flat.  EKG without ischemic changes.  Suspect this is all related to her severely elevated blood pressure on admission. -Continue with aspirin and statin therapy. -She is on heparin for A. fib. -Do not suspect ACS.  #CKD stage IV -No indications for dialysis currently.  We will hold further diuresis.  I suspect her condition is likely worsened by A. fib with RVR right now.  For questions or updates, please contact [place] Please consult [email or web address] for contact info under   Time Spent with Patient: I have spent a total of 35 minutes with patient reviewing hospital notes, telemetry, EKGs, labs and examining the patient as well as establishing an assessment and plan that was discussed with the patient.  > 50% of time was spent in direct patient care.    Signed, Addison Naegeli. Audie Box, MD, [place]  12/28/2020 9:17 AM

## 2020-12-28 NOTE — Progress Notes (Signed)
  Echocardiogram 2D Echocardiogram has been performed.  Molly [name] 12/28/2020, 12:14 PM

## 2020-12-28 NOTE — Progress Notes (Signed)
PROGRESS NOTE    Molly [name]   [id number]  DOB: 01/07/1951  PCP: [name], MD    DOA: 12/27/2020 LOS: 1    Brief Narrative / Hospital Course to Date:   70 year old female with past medical history of hypertension, left renal artery stenosis (A999333) diastolic CHF, CKD stage IV, CAD, PAD with bilateral SFA CTO's, anemia chronic disease diabetes presented on 12/27/2020 with 4 days of chest pain.  BP was uncontrolled and spiked as high as 234/208 in the ED.  Chest x-ray consistent with pulmonary edema.  Patient briefly required BiPAP.  Admitted to [facility] service with cardiology consulted. Patient developed A. fib with RVR on 9/13.  Assessment & Plan   Principal Problem:   Acute on chronic respiratory failure with hypoxia and hypercapnia (HCC) Active Problems:   HTN (hypertension)   Type 2 diabetes mellitus with stage 3b chronic kidney disease, with long-term current use of insulin (HCC)   Elevated troponin   Acute on chronic heart failure with preserved ejection fraction (HFpEF) ([place])   CAP (community acquired pneumonia)   Anemia of chronic disease   Acute respiratory failure with hypoxia Hypertensive emergency with chest pain Recurrent flash pulmonary edema Left renal artery stenosis Chronic HFpEF Patient presented with 3 to 4 days of chest heaviness, with uncontrolled blood pressure, dyspnea, orthopnea, 5 pound weight gain.  Chest x-ray with pulmonary edema.   Initially required BiPAP and weaned down to 4 L/min. Net IO Since Admission: 160 mL [12/28/20 1446] --Cardiology consulted, appreciate recs --Status post diuresis, diuretics on hold due to worsening renal function --I/O's and daily weights --Monitor renal function electrolytes --Supplement O2 to maintain sats above 90%  Left renal artery stenosis -leading to hypertensive crisis and recurrent flash pulmonary edema.  Cardiology has discussed case with vascular surgery.  Considering intervention with  stenting once respiratory status and A. fib RVR are improved.  A. fib with RVR -new onset overnight 9/13.  No prior history. Initially given Cardizem bolus with only brief improvement.  Rates remained uncontrolled on Cardizem drip at 15. Patient significantly symptomatic. --Cardiology managing, appreciated --Continue heparin gtt --Continue amiodarone gtt --Started on Lopressor 25 mg q6h -- Telemetry --Maintain K>4, Mg>2  CKD stage IV -present with creatinine 3.53, increased to 3.79 after diuresis.  Diuresis is on hold.  Currently no indication for dialysis. --Monitor BMP --Nephrology consulted worsening  Hyponatremia, mild-presented with sodium 131 >> 129 today. Diuresis is on hold.  Recheck BMP tomorrow and further evaluation if not improved.  Hypoglycemic episode -a.m. CBG today 66.  Treated per protocol.  Monitor.  Possible pneumonia -with asymmetric opacities on chest x-ray, started on empiric Rocephin on admission for possible atypical pneumonia.  Procalcitonin mildly elevated 0.39.  Patient afebrile with no leukocytosis. -- Continue Rocephin for now and monitor another 24 hours  Elevated troponin due to demand ischemia -in setting of hypertensive crisis  Hypertension -presented in hypertensive crisis due to renal artery stenosis.  BP improved. -- Continue amlodipine --Started on oral Lopressor --Hydralazine as needed --Possible intervention for left renal artery stenosis with vascular due to recurrent episodes of flash pulmonary edema  Type 2 diabetes, uncontrolled with A1c 8.2% on 8/13 -  Home regimen Humulin 70/30 mix 8 to 12 units daily 9/13: AM hypoglycemia --Continued on 8 units 70/30 --Reduce to 6 units qAM --Very sensitive sliding scale NovoLog --Hypoglycemia protocol  CAD -as evidenced by coronary calcifications on chest CT.  Patient chest pain improved with blood pressure control.  EKG without ischemic  changes. --Continue aspirin and statin --Currently on  heparin for A. Fib  Dyslipidemia -continue Crestor and Zetia  PAD -continue statin and Pletal  Anemia of chronic disease -hemoglobin near baseline.  Monitor CBC  GERD -continue Pepcid twice daily  Patient BMI: Body mass index is 26.65 kg/m.   DVT prophylaxis:    Diet:  Diet Orders (From admission, onward)     Start     Ordered   12/27/20 1737  Diet heart healthy/carb modified Room service appropriate? Yes; Fluid consistency: Thin  Diet effective now       Question Answer Comment  Diet-HS Snack? Nothing   Room service appropriate? Yes   Fluid consistency: Thin      12/27/20 1736              Code Status: Full Code   Subjective 12/28/20    Patient seen this morning at bedside.  She is resting bed awake but with eyes closed for the most part.  She is feeling better now that her heart rate is controlled on amiodarone drip.  She denies shortness of breath or any chest pain or heaviness right now.  No other acute complaints including fever chills, nausea vomiting.   Disposition Plan & Communication   Status is: Inpatient  Remains inpatient appropriate because:IV treatments appropriate due to intensity of illness or inability to take PO  Dispo: The patient is from: Home              Anticipated d/c is to: Home              Patient currently is not medically stable to d/c.   Difficult to place patient No    Consults, Procedures, Significant Events   Consultants:  Cardiology  Procedures:  Echo 12/28/2020 -EF 40 to 45%, LV global hypokinesis, indeterminate diastolic parameters, moderately elevated PASP, trivial circumferential pericardial effusion  Antimicrobials:  Anti-infectives (From admission, onward)    Start     Dose/Rate Route Frequency Ordered Stop   12/27/20 1530  cefTRIAXone (ROCEPHIN) 2 g in sodium chloride 0.9 % 100 mL IVPB        2 g 200 mL/hr over 30 Minutes Intravenous Every 24 hours 12/27/20 1435           Micro    Objective   Vitals:    12/28/20 0837 12/28/20 0900 12/28/20 0943 12/28/20 1126  BP: 113/79 113/71 (!) 118/53 117/68  Pulse: 75   81  Resp: (!) 22   20  Temp: 97.7 F (36.5 C)   98.9 F (37.2 C)  TempSrc: Oral   Oral  SpO2: 98%   98%  Weight:      Height:        Intake/Output Summary (Last 24 hours) at 12/28/2020 1433 Last data filed at 12/28/2020 0432 Gross per 24 hour  Intake 460 ml  Output 300 ml  Net 160 ml   Filed Weights   12/27/20 0636 12/27/20 1713 12/28/20 0040  Weight: 66.7 kg 66.1 kg 66.1 kg    Physical Exam:  General exam: awake with eyes closed, no acute distress HEENT: moist mucus membranes, hearing grossly normal  Respiratory system: Bilateral rales, no expiratory wheezes, normal respiratory effort, on 4 L nasal cannula oxygen. Cardiovascular system: Irregularly irregular, no pedal edema.   Gastrointestinal system: soft, nontender, nondistended Central nervous system: A&O x3. no gross focal neurologic deficits, normal speech Extremities: moves all, no edema Psychiatry: normal mood, congruent affect, judgement and insight appear normal  Labs  Data Reviewed: I have personally reviewed following labs and imaging studies  CBC: Recent Labs  Lab 12/27/20 0640 12/27/20 0847  WBC 8.8  --   HGB 11.4* 12.9  HCT 35.9* 38.0  MCV 87.3  --   PLT 369  --    Basic Metabolic Panel: Recent Labs  Lab 12/27/20 0640 12/27/20 0847 12/28/20 0336  NA 131* 130* 129*  K 3.8 3.8 3.5  CL 91*  --  87*  CO2 26  --  31  GLUCOSE 166*  --  177*  BUN 36*  --  44*  CREATININE 3.53*  --  3.79*  CALCIUM 9.7  --  8.7*   GFR: Estimated Creatinine Clearance: 12.7 mL/min (A) (by C-G formula based on SCr of 3.79 mg/dL (H)). Liver Function Tests: No results for input(s): AST, ALT, ALKPHOS, BILITOT, PROT, ALBUMIN in the last 168 hours. No results for input(s): LIPASE, AMYLASE in the last 168 hours. No results for input(s): AMMONIA in the last 168 hours. Coagulation Profile: No results for  input(s): INR, PROTIME in the last 168 hours. Cardiac Enzymes: No results for input(s): CKTOTAL, CKMB, CKMBINDEX, TROPONINI in the last 168 hours. BNP (last 3 results) No results for input(s): PROBNP in the last 8760 hours. HbA1C: No results for input(s): HGBA1C in the last 72 hours. CBG: Recent Labs  Lab 12/27/20 1638 12/27/20 2112 12/28/20 0604 12/28/20 1120 12/28/20 1215  GLUCAP 177* 164* 163* 66* 89   Lipid Profile: No results for input(s): CHOL, HDL, LDLCALC, TRIG, CHOLHDL, LDLDIRECT in the last 72 hours. Thyroid Function Tests: No results for input(s): TSH, T4TOTAL, FREET4, T3FREE, THYROIDAB in the last 72 hours. Anemia Panel: No results for input(s): VITAMINB12, FOLATE, FERRITIN, TIBC, IRON, RETICCTPCT in the last 72 hours. Sepsis Labs: Recent Labs  Lab 12/27/20 1130  PROCALCITON 0.39    Recent Results (from the past 240 hour(s))  Resp Panel by RT-PCR (Flu A&B, Covid) Nasopharyngeal Swab     Status: None   Collection Time: 12/27/20  8:53 AM   Specimen: Nasopharyngeal Swab; Nasopharyngeal(NP) swabs in vial transport medium  Result Value Ref Range Status   SARS Coronavirus 2 by RT PCR NEGATIVE NEGATIVE Final    Comment: (NOTE) SARS-CoV-2 target nucleic acids are NOT DETECTED.  The SARS-CoV-2 RNA is generally detectable in upper respiratory specimens during the acute phase of infection. The lowest concentration of SARS-CoV-2 viral copies this assay can detect is 138 copies/mL. A negative result does not preclude SARS-Cov-2 infection and should not be used as the sole basis for treatment or other patient management decisions. A negative result may occur with  improper specimen collection/handling, submission of specimen other than nasopharyngeal swab, presence of viral mutation(s) within the areas targeted by this assay, and inadequate number of viral copies(<138 copies/mL). A negative result must be combined with clinical observations, patient history, and  epidemiological information. The expected result is Negative.  Fact Sheet for Patients:  EntrepreneurPulse.com.au  Fact Sheet for Healthcare Providers:  IncredibleEmployment.be  This test is no t yet approved or cleared by the [place] FDA and  has been authorized for detection and/or diagnosis of SARS-CoV-2 by FDA under an Emergency Use Authorization (EUA). This EUA will remain  in effect (meaning this test can be used) for the duration of the COVID-19 declaration under Section 564(b)(1) of the Act, 21 U.S.C.section 360bbb-3(b)(1), unless the authorization is terminated  or revoked sooner.       Influenza A by PCR NEGATIVE NEGATIVE Final   Influenza B  by PCR NEGATIVE NEGATIVE Final    Comment: (NOTE) The Xpert Xpress SARS-CoV-2/FLU/RSV plus assay is intended as an aid in the diagnosis of influenza from Nasopharyngeal swab specimens and should not be used as a sole basis for treatment. Nasal washings and aspirates are unacceptable for Xpert Xpress SARS-CoV-2/FLU/RSV testing.  Fact Sheet for Patients: EntrepreneurPulse.com.au  Fact Sheet for Healthcare Providers: IncredibleEmployment.be  This test is not yet approved or cleared by the [place] FDA and has been authorized for detection and/or diagnosis of SARS-CoV-2 by FDA under an Emergency Use Authorization (EUA). This EUA will remain in effect (meaning this test can be used) for the duration of the COVID-19 declaration under Section 564(b)(1) of the Act, 21 U.S.C. section 360bbb-3(b)(1), unless the authorization is terminated or revoked.  Performed at [facility], [place] [address] [place], [place] [address]       Imaging Studies   DG Chest 2 View  Result Date: 12/27/2020 CLINICAL DATA:  70 year old female with chest pain. EXAM: CHEST - 2 VIEW COMPARISON:  Chest radiographs 12/07/2020 and earlier. FINDINGS: Improved lung volumes.  Mediastinal contours are stable with heart size at the upper limits of normal. Calcified aortic atherosclerosis. Visualized tracheal air column is within normal limits. Diffuse increased pulmonary interstitial opacity, hazy and confluent at the right lung base. No pleural effusion or pneumothorax. No consolidation. Gas-filled bowel loops in the upper abdomen have increased, nonspecific. Stable cholecystectomy clips. No acute osseous abnormality identified. IMPRESSION: Increased and asymmetric pulmonary interstitial opacity since last month, confluent at the right lung base. No pleural effusion or consolidation. Top differential considerations include asymmetric pulmonary edema and viral/atypical respiratory infection Electronically Signed   By: Genevie Ann M.D.   On: 12/27/2020 07:26   ECHOCARDIOGRAM LIMITED  Result Date: 12/28/2020    ECHOCARDIOGRAM LIMITED REPORT   Patient Name:   Molly [name] Date of Exam: 12/28/2020 Medical Rec #:  [id number]    Height:       62.0 in Accession #:    [id number]   Weight:       145.7 lb Date of Birth:  [birth year]    BSA:          1.671 m Patient Age:    70 years     BP:           125/93 mmHg Patient Gender: F            HR:           113 bpm. Exam Location:  Inpatient Procedure: Limited Echo, Limited Color Doppler and Cardiac Doppler Indications:    R07.9* Chest pain, unspecified. Elevated Troponin  History:        Patient has prior history of Echocardiogram examinations, most                 recent 11/27/2020. Risk Factors:Hypertension, Dyslipidemia and                 Diabetes.  Sonographer:    Bernadene Person RDCS Referring Phys: [id number] [place]  1. Compared with the echo A999333, systolic function is reduced. Left ventricular ejection fraction, by estimation, is 40 to 45%. The left ventricle has mildly decreased function. The left ventricle demonstrates global hypokinesis. Left ventricular diastolic parameters are indeterminate.  2. Right ventricular systolic  function is normal. The right ventricular size is normal. There is moderately elevated pulmonary artery systolic pressure.  3. The pericardial effusion is circumferential.  4. The mitral valve is  normal in structure. Mild mitral valve regurgitation. No evidence of mitral stenosis.  5. The aortic valve is tricuspid. There is mild calcification of the aortic valve. There is mild thickening of the aortic valve. Aortic valve regurgitation is not visualized. No aortic stenosis is present.  6. The inferior vena cava is dilated in size with <50% respiratory variability, suggesting right atrial pressure of 15 mmHg. FINDINGS  Left Ventricle: Compared with the echo A999333, systolic function is reduced. Left ventricular ejection fraction, by estimation, is 40 to 45%. The left ventricle has mildly decreased function. The left ventricle demonstrates global hypokinesis. The left ventricular internal cavity size was normal in size. There is no left ventricular hypertrophy. Left ventricular diastolic parameters are indeterminate. Right Ventricle: The right ventricular size is normal. No increase in right ventricular wall thickness. Right ventricular systolic function is normal. There is moderately elevated pulmonary artery systolic pressure. The tricuspid regurgitant velocity is 2.87 m/s, and with an assumed right atrial pressure of 15 mmHg, the estimated right ventricular systolic pressure is A999333 mmHg. Left Atrium: Left atrial size was normal in size. Right Atrium: Right atrial size was normal in size. Pericardium: Trivial pericardial effusion is present. The pericardial effusion is circumferential. Mitral Valve: The mitral valve is normal in structure. Mild mitral annular calcification. Mild mitral valve regurgitation. No evidence of mitral valve stenosis. Tricuspid Valve: The tricuspid valve is normal in structure. Tricuspid valve regurgitation is trivial. No evidence of tricuspid stenosis. Aortic Valve: The aortic valve is  tricuspid. There is mild calcification of the aortic valve. There is mild thickening of the aortic valve. Aortic valve regurgitation is not visualized. No aortic stenosis is present. Pulmonic Valve: The pulmonic valve was normal in structure. Pulmonic valve regurgitation is trivial. No evidence of pulmonic stenosis. Aorta: The aortic root is normal in size and structure. Venous: The inferior vena cava is dilated in size with less than 50% respiratory variability, suggesting right atrial pressure of 15 mmHg. IAS/Shunts: No atrial level shunt detected by color flow Doppler. LEFT VENTRICLE PLAX 2D LVIDd:         4.40 cm LVIDs:         2.05 cm LV PW:         1.00 cm LV IVS:        1.00 cm LVOT diam:     2.10 cm LV SV:         46 LV SV Index:   27 LVOT Area:     3.46 cm  LV Volumes (MOD) LV vol d, MOD A2C: 108.0 ml LV vol d, MOD A4C: 80.2 ml LV vol s, MOD A2C: 66.1 ml LV vol s, MOD A4C: 47.1 ml LV SV MOD A2C:     41.9 ml LV SV MOD A4C:     80.2 ml LV SV MOD BP:      37.5 ml RIGHT VENTRICLE TAPSE (M-mode): 1.1 cm LEFT ATRIUM         Index LA diam:    4.30 cm 2.57 cm/m  AORTIC VALVE LVOT Vmax:   87.37 cm/s LVOT Vmean:  63.833 cm/s LVOT VTI:    0.133 m  AORTA Ao Root diam: 3.20 cm MR Peak grad:    90.2 mmHg   TRICUSPID VALVE MR Mean grad:    57.0 mmHg   TR Peak grad:   32.9 mmHg MR Vmax:         475.00 cm/s TR Vmax:        287.00 cm/s MR Vmean:  357.0 cm/s MR PISA:         1.01 cm    SHUNTS MR PISA Eff ROA: 8 mm       Systemic VTI:  0.13 m MR PISA Radius:  0.40 cm     Systemic Diam: 2.10 cm Skeet Latch MD Electronically signed by Skeet Latch MD Signature Date/Time: 12/28/2020/1:40:55 PM    Final      Medications   Scheduled Meds:  amLODipine  10 mg Oral Daily   cilostazol  50 mg Oral BID   ezetimibe  10 mg Oral Daily   famotidine  20 mg Oral BID   hydrALAZINE  25 mg Oral Q12H   insulin aspart  0-6 Units Subcutaneous TID WC   insulin aspart protamine- aspart  8 Units Subcutaneous Q breakfast    metoprolol tartrate  25 mg Oral Q6H   rosuvastatin  40 mg Oral Daily   sodium chloride flush  3 mL Intravenous Q12H   Continuous Infusions:  sodium chloride     amiodarone 60 mg/hr (12/28/20 0923)   Followed by   amiodarone     cefTRIAXone (ROCEPHIN)  IV Stopped (12/27/20 1813)   heparin 1,000 Units/hr (12/28/20 0655)       LOS: 1 day    Time spent: 35 minutes with > 50% spent at bedside and in coordination of care     Ezekiel Slocumb, DO [facility]  12/28/2020, 2:33 PM      If 7PM-7AM, please contact night-coverage. How to contact the [facility] Attending or Consulting provider [place] or covering provider during after hours [place], for this patient?    Check the care team in [facility] and look for a) attending/consulting [facility] provider listed and b) the [facility] team listed Log into [email or web address] and use [place]'s universal password to access. If you do not have the password, please contact the hospital operator. Locate the [facility] provider you are looking for under [facility] and page to a number that you can be directly reached. If you still have difficulty reaching the provider, please page the [facility] (Director on Call) for the Hospitalists listed on amion for assistance.

## 2020-12-28 NOTE — Progress Notes (Signed)
Patient converted to normal sinus rhythm. Patient denies SOB and is feeling much better. Will continue to monitor patient.

## 2020-12-28 NOTE — Progress Notes (Signed)
Inpatient Diabetes Program Recommendations  AACE/ADA: New Consensus Statement on Inpatient Glycemic Control (2015)  Target Ranges:  Prepandial:   less than 140 mg/dL      Peak postprandial:   less than 180 mg/dL (1-2 hours)      Critically ill patients:  140 - 180 mg/dL   Lab Results  Component Value Date   GLUCAP 89 12/28/2020   HGBA1C 8.2 (H) 11/27/2020    Review of Glycemic Control Results for Molly, [name] (MRN [id number]) as of 12/28/2020 12:48  Ref. Range 12/27/2020 16:38 12/27/2020 21:12 12/28/2020 06:04 12/28/2020 11:20 12/28/2020 12:15  Glucose-Capillary Latest Ref Range: 70 - 99 mg/dL 177 (H) 164 (H) 163 (H) 66 (L) 89   Diabetes history: DM 2 Outpatient Diabetes medications:  Humulin 70/30 8-12 units daily Current orders for Inpatient glycemic control:  Novolog 0-6 units tid with meals Novolog 70/30 mix- 8 units daily  Inpatient Diabetes Program Recommendations:   Consider reducing Novolog 70/30 mix to 6 units q AM.   Thanks,  Adah Perl, RN, BC-ADM Inpatient Diabetes Coordinator Pager [phone]  (8a-5p)

## 2020-12-28 NOTE — Progress Notes (Addendum)
Patient with sudden onset of rapid HR at rest, sustained. EKG shows afib rvr.  MD notified.  Orders placed for iv heparin and cardizem. IV cardizem given.  Patient oxygen saturation in the upper 80s on 6L [place], patient changed to nonrebreather and oxgen saturation increased to upper 90s. Report given to day RN.    Yellow MEWS protocol initiated.

## 2020-12-29 ENCOUNTER — Inpatient Hospital Stay (HOSPITAL_COMMUNITY): Payer: Medicare Other

## 2020-12-29 ENCOUNTER — Encounter (HOSPITAL_COMMUNITY)
Admission: EM | Disposition: A | Discharge status: Home Health | Payer: Self-pay | Source: Home / Self Care | Attending: Family Medicine

## 2020-12-29 DIAGNOSIS — J9621 Acute and chronic respiratory failure with hypoxia: Secondary | ICD-10-CM | POA: Diagnosis not present

## 2020-12-29 DIAGNOSIS — I10 Essential (primary) hypertension: Secondary | ICD-10-CM

## 2020-12-29 DIAGNOSIS — J9622 Acute and chronic respiratory failure with hypercapnia: Secondary | ICD-10-CM | POA: Diagnosis not present

## 2020-12-29 DIAGNOSIS — I272 Pulmonary hypertension, unspecified: Secondary | ICD-10-CM

## 2020-12-29 LAB — BASIC METABOLIC PANEL
Anion gap: 16 — ABNORMAL HIGH (ref 5–15)
BUN: 49 mg/dL — ABNORMAL HIGH (ref 8–23)
CO2: 26 mmol/L (ref 22–32)
Calcium: 8.2 mg/dL — ABNORMAL LOW (ref 8.9–10.3)
Chloride: 84 mmol/L — ABNORMAL LOW (ref 98–111)
Creatinine, Ser: 4.28 mg/dL — ABNORMAL HIGH (ref 0.44–1.00)
GFR, Estimated: 11 mL/min — ABNORMAL LOW (ref >60)
Glucose, Bld: 160 mg/dL — ABNORMAL HIGH (ref 70–99)
Potassium: 3.7 mmol/L (ref 3.5–5.1)
Sodium: 126 mmol/L — ABNORMAL LOW (ref 135–145)

## 2020-12-29 LAB — CBC
HCT: 27.6 % — ABNORMAL LOW (ref 36.0–46.0)
Hemoglobin: 9 g/dL — ABNORMAL LOW (ref 12.0–15.0)
MCH: 27.3 pg (ref 26.0–34.0)
MCHC: 32.6 g/dL (ref 30.0–36.0)
MCV: 83.6 fL (ref 80.0–100.0)
Platelets: 296 10*3/uL (ref 150–400)
RBC: 3.3 MIL/uL — ABNORMAL LOW (ref 3.87–5.11)
RDW: 14.4 % (ref 11.5–15.5)
WBC: 9 10*3/uL (ref 4.0–10.5)
nRBC: 0 % (ref 0.0–0.2)

## 2020-12-29 LAB — HEPARIN LEVEL (UNFRACTIONATED): Heparin Unfractionated: 0.66 IU/mL (ref 0.30–0.70)

## 2020-12-29 LAB — GLUCOSE, CAPILLARY
Glucose-Capillary: 168 mg/dL — ABNORMAL HIGH (ref 70–99)
Glucose-Capillary: 151 mg/dL — ABNORMAL HIGH (ref 70–99)
Glucose-Capillary: 83 mg/dL (ref 70–99)
Glucose-Capillary: 90 mg/dL (ref 70–99)
Glucose-Capillary: 89 mg/dL (ref 70–99)

## 2020-12-29 LAB — MAGNESIUM: Magnesium: 2.1 mg/dL (ref 1.7–2.4)

## 2020-12-29 SURGERY — RENAL ANGIOGRAPHY
Anesthesia: LOCAL

## 2020-12-29 MED ORDER — HYDRALAZINE HCL 25 MG PO TABS
25.0000 mg | ORAL_TABLET | Freq: Three times a day (TID) | ORAL | Status: DC
  Administered 2020-12-29 – 2021-01-09 (×19): 25 mg via ORAL
  Filled 2020-12-29 (×29): qty 1

## 2020-12-29 MED ORDER — SALINE SPRAY 0.65 % NA SOLN
1.0000 | NASAL | Status: DC | PRN
  Administered 2020-12-29: 1 via NASAL
  Filled 2020-12-29: qty 44

## 2020-12-29 MED ORDER — METHOCARBAMOL 500 MG PO TABS
500.0000 mg | ORAL_TABLET | Freq: Once | ORAL | Status: AC
  Administered 2020-12-29: 500 mg via ORAL
  Filled 2020-12-29: qty 1

## 2020-12-29 MED ORDER — METOPROLOL SUCCINATE ER 50 MG PO TB24
50.0000 mg | ORAL_TABLET | Freq: Every day | ORAL | Status: DC
  Administered 2020-12-29 – 2021-01-17 (×12): 50 mg via ORAL
  Filled 2020-12-29 (×17): qty 1

## 2020-12-29 MED ORDER — OXYMETAZOLINE HCL 0.05 % NA SOLN
2.0000 | Freq: Once | NASAL | Status: AC
  Administered 2020-12-29: 2 via NASAL
  Filled 2020-12-29: qty 30

## 2020-12-29 MED ORDER — ISOSORBIDE MONONITRATE ER 30 MG PO TB24
30.0000 mg | ORAL_TABLET | Freq: Every day | ORAL | Status: DC
  Administered 2020-12-29 – 2021-01-17 (×14): 30 mg via ORAL
  Filled 2020-12-29 (×16): qty 1

## 2020-12-29 MED ORDER — AMIODARONE HCL 200 MG PO TABS
400.0000 mg | ORAL_TABLET | Freq: Two times a day (BID) | ORAL | Status: DC
  Administered 2020-12-29 – 2020-12-30 (×4): 400 mg via ORAL
  Filled 2020-12-29 (×5): qty 2

## 2020-12-29 MED ORDER — AMIODARONE HCL 200 MG PO TABS: 200.0000 mg | ORAL_TABLET | Freq: Every day | ORAL | Status: DC

## 2020-12-29 NOTE — Plan of Care (Signed)

## 2020-12-29 NOTE — Progress Notes (Addendum)
PROGRESS NOTE    Molly [name]  [id number] DOB: April 20, 1950 DOA: 12/27/2020 PCP: [name], MD   Brief Narrative:  70 year old female with past medical history of hypertension, left renal artery stenosis (A999333) diastolic CHF, CKD stage IV, CAD, PAD with bilateral SFA CTO's, anemia chronic disease diabetes presented on 12/27/2020 with 4 days of chest pain.  BP was uncontrolled and spiked as high as 234/208 in the ED.  Chest x-ray consistent with pulmonary edema.  Patient briefly required BiPAP.   Admitted to [facility] service with cardiology consulted. Patient developed A. fib with RVR on 9/13.  Assessment & Plan:   Principal Problem:   Acute on chronic respiratory failure with hypoxia and hypercapnia (HCC) Active Problems:   HTN (hypertension)   Type 2 diabetes mellitus with stage 3b chronic kidney disease, with long-term current use of insulin (HCC)   Elevated troponin   Acute on chronic heart failure with preserved ejection fraction (HFpEF) ([place])   CAP (community acquired pneumonia)   Anemia of chronic disease   Acute respiratory failure with hypoxia secondary to recurrent flash pulmonary edema/acute systolic congestive heart failure: Echo shows reduced ejection fraction to 40 to 45% compared to the previous 1.  Cardiology on board and managing.  Due to rising creatinine, she is on dialysis holiday.  No plans for diuretics per cardiology.  Will likely need dialysis which will help.  Hypertensive emergency with chest pain: No chest pain and blood pressure controlled now.  Continue amlodipine 10 mg p.o. daily, hydralazine 25 mg p.o. 3 times daily Imdur to 30 mg p.o. daily, Toprol-XL 50 mg p.o. daily.  Left renal artery stenosis: Has a history of 80 to 90% of proximal left renal artery stenosis.  Plan was to do angiogram and stent but due to rising creatinine, this plan is on hold for now.  Cardiology and nephrology managing.  New onset A. fib with RVR: Developed overnight  12/28/2020.  She was initially started on amiodarone drip and converted back to sinus rhythm.  Now transition to amiodarone 400 mg p.o. twice daily for 7 days followed by 200 mg daily for 21 days.  Per cardiology, it is not going to be long-term but instead only for 4 weeks.  Patient's heparin was stopped this morning due to epistaxis and drop in hemoglobin.  Epistaxis: Nosebleed overnight 12/30/2018.  Heparin stopped.  CKD stage IV/progressive renal failure: The lining renal function.  Nephrology on board.  They have offered her dialysis.  Defer to nephrology.  Hyponatremia: Down again to 126.  Perhaps dialysis will help.  Defer to nephrology.  Community-acquired pneumonia: Continue Rocephin, reduced dose to 1 g Rocephin.  Elevated troponin/demand ischemia: In the setting of hypertensive crisis.  EKG without ischemic changes.  Likely demand ischemia. 397 > 376.  Cardiology on board.  No plan for intervention.  Type 2 diabetes mellitus: Recent hemoglobin A1c 8.2% on 11/27/2020.  Home regimen includes Humulin 70/30 mix 8 to 12 units daily.  Currently on 8 units here and SSI.  Blood sugar controlled.  Continue current regimen.  Dyslipidemia: Continue Zetia and Crestor.  PAD: Continue statin and Pletal.  Anemia of chronic disease: Some drop in hemoglobin from 11.4-day before yesterday to 9.0 today.  Monitor closely.  GERD: Continue PPI.  DVT prophylaxis:    Code Status: Full Code  Family Communication: Husband present at bedside.  Plan of care discussed with patient in length and he verbalized understanding and agreed with it.  Status is: Inpatient  Remains inpatient  appropriate because:Ongoing diagnostic testing needed not appropriate for outpatient work up  Dispo: The patient is from: Home              Anticipated d/c is to: Home              Patient currently is not medically stable to d/c.   Difficult to place patient No        Estimated body mass index is 27.03 kg/m as  calculated from the following:   Height as of this encounter: '5\' 2"'$  (1.575 m).   Weight as of this encounter: 67 kg.     Nutritional Assessment: Body mass index is 27.03 kg/m.Marland Kitchen Seen by dietician.  I agree with the assessment and plan as outlined below: Nutrition Status:        .  Skin Assessment: I have examined the patient's skin and I agree with the wound assessment as performed by the wound care RN as outlined below:    Consultants:  Nephrology Cardiology  Procedures:  None  Antimicrobials:  Anti-infectives (From admission, onward)    Start     Dose/Rate Route Frequency Ordered Stop   12/27/20 1530  cefTRIAXone (ROCEPHIN) 2 g in sodium chloride 0.9 % 100 mL IVPB        2 g 200 mL/hr over 30 Minutes Intravenous Every 24 hours 12/27/20 1435            Subjective: Seen and examined.  Husband the bedside.  Still complains of shortness of breath.  No other complaint.  Still on 3 L of oxygen.  Objective: Vitals:   12/28/20 2343 12/29/20 0504 12/29/20 0509 12/29/20 1225  BP: (!) 112/56  (!) 123/54 (!) 128/54  Pulse: 80  74 80  Resp: '18  16 16  '$ Temp: 97.6 F (36.4 C)  (!) 97.5 F (36.4 C) 98.2 F (36.8 C)  TempSrc: Oral  Oral Oral  SpO2: 92%  98% 97%  Weight:  67 kg    Height:        Intake/Output Summary (Last 24 hours) at 12/29/2020 1407 Last data filed at 12/29/2020 1205 Gross per 24 hour  Intake 1414.48 ml  Output 250 ml  Net 1164.48 ml   Filed Weights   12/27/20 1713 12/28/20 0040 12/29/20 0504  Weight: 66.1 kg 66.1 kg 67 kg    Examination:  General exam: Appears calm and comfortable  Respiratory system: Bibasilar crackles. Respiratory effort normal. Cardiovascular system: S1 & S2 heard, RRR.  Elevated JVD, murmurs, rubs, gallops or clicks. No pedal edema. Gastrointestinal system: Abdomen is nondistended, soft and nontender. No organomegaly or masses felt. Normal bowel sounds heard. Central nervous system: Alert and oriented. No focal  neurological deficits. Extremities: Symmetric 5 x 5 power. Skin: No rashes, lesions or ulcers Psychiatry: Judgement and insight appear normal. Mood & affect appropriate.    Data Reviewed: I have personally reviewed following labs and imaging studies  CBC: Recent Labs  Lab 12/27/20 0640 12/27/20 0847 12/29/20 0332  WBC 8.8  --  9.0  HGB 11.4* 12.9 9.0*  HCT 35.9* 38.0 27.6*  MCV 87.3  --  83.6  PLT 369  --  0000000   Basic Metabolic Panel: Recent Labs  Lab 12/27/20 0640 12/27/20 0847 12/28/20 0336 12/29/20 0332  NA 131* 130* 129* 126*  K 3.8 3.8 3.5 3.7  CL 91*  --  87* 84*  CO2 26  --  31 26  GLUCOSE 166*  --  177* 160*  BUN 36*  --  44* 49*  CREATININE 3.53*  --  3.79* 4.28*  CALCIUM 9.7  --  8.7* 8.2*  MG  --   --   --  2.1   GFR: Estimated Creatinine Clearance: 11.3 mL/min (A) (by C-G formula based on SCr of 4.28 mg/dL (H)). Liver Function Tests: No results for input(s): AST, ALT, ALKPHOS, BILITOT, PROT, ALBUMIN in the last 168 hours. No results for input(s): LIPASE, AMYLASE in the last 168 hours. No results for input(s): AMMONIA in the last 168 hours. Coagulation Profile: No results for input(s): INR, PROTIME in the last 168 hours. Cardiac Enzymes: No results for input(s): CKTOTAL, CKMB, CKMBINDEX, TROPONINI in the last 168 hours. BNP (last 3 results) No results for input(s): PROBNP in the last 8760 hours. HbA1C: No results for input(s): HGBA1C in the last 72 hours. CBG: Recent Labs  Lab 12/28/20 1215 12/28/20 1600 12/28/20 2117 12/29/20 0614 12/29/20 1059  GLUCAP 89 206* 140* 168* 151*   Lipid Profile: No results for input(s): CHOL, HDL, LDLCALC, TRIG, CHOLHDL, LDLDIRECT in the last 72 hours. Thyroid Function Tests: No results for input(s): TSH, T4TOTAL, FREET4, T3FREE, THYROIDAB in the last 72 hours. Anemia Panel: No results for input(s): VITAMINB12, FOLATE, FERRITIN, TIBC, IRON, RETICCTPCT in the last 72 hours. Sepsis Labs: Recent Labs  Lab  12/27/20 1130  PROCALCITON 0.39    Recent Results (from the past 240 hour(s))  Resp Panel by RT-PCR (Flu A&B, Covid) Nasopharyngeal Swab     Status: None   Collection Time: 12/27/20  8:53 AM   Specimen: Nasopharyngeal Swab; Nasopharyngeal(NP) swabs in vial transport medium  Result Value Ref Range Status   SARS Coronavirus 2 by RT PCR NEGATIVE NEGATIVE Final    Comment: (NOTE) SARS-CoV-2 target nucleic acids are NOT DETECTED.  The SARS-CoV-2 RNA is generally detectable in upper respiratory specimens during the acute phase of infection. The lowest concentration of SARS-CoV-2 viral copies this assay can detect is 138 copies/mL. A negative result does not preclude SARS-Cov-2 infection and should not be used as the sole basis for treatment or other patient management decisions. A negative result may occur with  improper specimen collection/handling, submission of specimen other than nasopharyngeal swab, presence of viral mutation(s) within the areas targeted by this assay, and inadequate number of viral copies(<138 copies/mL). A negative result must be combined with clinical observations, patient history, and epidemiological information. The expected result is Negative.  Fact Sheet for Patients:  EntrepreneurPulse.com.au  Fact Sheet for Healthcare Providers:  IncredibleEmployment.be  This test is no t yet approved or cleared by the [place] FDA and  has been authorized for detection and/or diagnosis of SARS-CoV-2 by FDA under an Emergency Use Authorization (EUA). This EUA will remain  in effect (meaning this test can be used) for the duration of the COVID-19 declaration under Section 564(b)(1) of the Act, 21 U.S.C.section 360bbb-3(b)(1), unless the authorization is terminated  or revoked sooner.       Influenza A by PCR NEGATIVE NEGATIVE Final   Influenza B by PCR NEGATIVE NEGATIVE Final    Comment: (NOTE) The Xpert Xpress  SARS-CoV-2/FLU/RSV plus assay is intended as an aid in the diagnosis of influenza from Nasopharyngeal swab specimens and should not be used as a sole basis for treatment. Nasal washings and aspirates are unacceptable for Xpert Xpress SARS-CoV-2/FLU/RSV testing.  Fact Sheet for Patients: EntrepreneurPulse.com.au  Fact Sheet for Healthcare Providers: IncredibleEmployment.be  This test is not yet approved or cleared by the [place] and has been authorized for  detection and/or diagnosis of SARS-CoV-2 by FDA under an Emergency Use Authorization (EUA). This EUA will remain in effect (meaning this test can be used) for the duration of the COVID-19 declaration under Section 564(b)(1) of the Act, 21 U.S.C. section 360bbb-3(b)(1), unless the authorization is terminated or revoked.  Performed at [facility], [place] [address], [place], [place] [address]       Radiology Studies: US RENAL  Result Date: 12/29/2020 CLINICAL DATA:  Hematuria. EXAM: RENAL / URINARY TRACT ULTRASOUND COMPLETE COMPARISON:  November 27, 2020. FINDINGS: Right Kidney: Renal measurements: 10.7 x 5.4 x 5.0 cm = volume: 150 mL. Echogenicity within normal limits. No mass or hydronephrosis visualized. Left Kidney: Renal measurements: 10.3 x 4.9 x 4.5 cm = volume: 112 mL. Echogenicity within normal limits. No mass visualized. Mild left hydronephrosis is noted. Bladder: Appears normal for degree of bladder distention. Ureteral jets were not visualized. Other: None. IMPRESSION: Mild left hydronephrosis. CT urogram may be performed to evaluate for possible cause of obstruction. Electronically Signed   By: Marijo Conception M.D.   On: 12/29/2020 10:23   ECHOCARDIOGRAM LIMITED  Result Date: 12/28/2020    ECHOCARDIOGRAM LIMITED REPORT   Patient Name:   Molly [name] Date of Exam: 12/28/2020 Medical Rec #:  [id number]    Height:       62.0 in Accession #:    [id number]   Weight:       145.7 lb  Date of Birth:  04/07/1951    BSA:          1.671 m Patient Age:    38 years     BP:           125/93 mmHg Patient Gender: F            HR:           113 bpm. Exam Location:  Inpatient Procedure: Limited Echo, Limited Color Doppler and Cardiac Doppler Indications:    R07.9* Chest pain, unspecified. Elevated Troponin  History:        Patient has prior history of Echocardiogram examinations, most                 recent 11/27/2020. Risk Factors:Hypertension, Dyslipidemia and                 Diabetes.  Sonographer:    Bernadene Person RDCS Referring Phys: [id number] [place]  1. Compared with the echo A999333, systolic function is reduced. Left ventricular ejection fraction, by estimation, is 40 to 45%. The left ventricle has mildly decreased function. The left ventricle demonstrates global hypokinesis. Left ventricular diastolic parameters are indeterminate.  2. Right ventricular systolic function is normal. The right ventricular size is normal. There is moderately elevated pulmonary artery systolic pressure.  3. The pericardial effusion is circumferential.  4. The mitral valve is normal in structure. Mild mitral valve regurgitation. No evidence of mitral stenosis.  5. The aortic valve is tricuspid. There is mild calcification of the aortic valve. There is mild thickening of the aortic valve. Aortic valve regurgitation is not visualized. No aortic stenosis is present.  6. The inferior vena cava is dilated in size with <50% respiratory variability, suggesting right atrial pressure of 15 mmHg. FINDINGS  Left Ventricle: Compared with the echo A999333, systolic function is reduced. Left ventricular ejection fraction, by estimation, is 40 to 45%. The left ventricle has mildly decreased function. The left ventricle demonstrates global hypokinesis. The left ventricular internal cavity size  was normal in size. There is no left ventricular hypertrophy. Left ventricular diastolic parameters are indeterminate. Right  Ventricle: The right ventricular size is normal. No increase in right ventricular wall thickness. Right ventricular systolic function is normal. There is moderately elevated pulmonary artery systolic pressure. The tricuspid regurgitant velocity is 2.87 m/s, and with an assumed right atrial pressure of 15 mmHg, the estimated right ventricular systolic pressure is A999333 mmHg. Left Atrium: Left atrial size was normal in size. Right Atrium: Right atrial size was normal in size. Pericardium: Trivial pericardial effusion is present. The pericardial effusion is circumferential. Mitral Valve: The mitral valve is normal in structure. Mild mitral annular calcification. Mild mitral valve regurgitation. No evidence of mitral valve stenosis. Tricuspid Valve: The tricuspid valve is normal in structure. Tricuspid valve regurgitation is trivial. No evidence of tricuspid stenosis. Aortic Valve: The aortic valve is tricuspid. There is mild calcification of the aortic valve. There is mild thickening of the aortic valve. Aortic valve regurgitation is not visualized. No aortic stenosis is present. Pulmonic Valve: The pulmonic valve was normal in structure. Pulmonic valve regurgitation is trivial. No evidence of pulmonic stenosis. Aorta: The aortic root is normal in size and structure. Venous: The inferior vena cava is dilated in size with less than 50% respiratory variability, suggesting right atrial pressure of 15 mmHg. IAS/Shunts: No atrial level shunt detected by color flow Doppler. LEFT VENTRICLE PLAX 2D LVIDd:         4.40 cm LVIDs:         2.05 cm LV PW:         1.00 cm LV IVS:        1.00 cm LVOT diam:     2.10 cm LV SV:         46 LV SV Index:   27 LVOT Area:     3.46 cm  LV Volumes (MOD) LV vol d, MOD A2C: 108.0 ml LV vol d, MOD A4C: 80.2 ml LV vol s, MOD A2C: 66.1 ml LV vol s, MOD A4C: 47.1 ml LV SV MOD A2C:     41.9 ml LV SV MOD A4C:     80.2 ml LV SV MOD BP:      37.5 ml RIGHT VENTRICLE TAPSE (M-mode): 1.1 cm LEFT ATRIUM          Index LA diam:    4.30 cm 2.57 cm/m  AORTIC VALVE LVOT Vmax:   87.37 cm/s LVOT Vmean:  63.833 cm/s LVOT VTI:    0.133 m  AORTA Ao Root diam: 3.20 cm MR Peak grad:    90.2 mmHg   TRICUSPID VALVE MR Mean grad:    57.0 mmHg   TR Peak grad:   32.9 mmHg MR Vmax:         475.00 cm/s TR Vmax:        287.00 cm/s MR Vmean:        357.0 cm/s MR PISA:         1.01 cm    SHUNTS MR PISA Eff ROA: 8 mm       Systemic VTI:  0.13 m MR PISA Radius:  0.40 cm     Systemic Diam: 2.10 cm Skeet Latch MD Electronically signed by Skeet Latch MD Signature Date/Time: 12/28/2020/1:40:55 PM    Final     Scheduled Meds:  amiodarone  400 mg Oral BID   Followed by   Derrill Memo ON 01/05/2021] amiodarone  200 mg Oral Daily   amLODipine  10 mg Oral  Daily   cilostazol  50 mg Oral BID   ezetimibe  10 mg Oral Daily   famotidine  20 mg Oral Daily   hydrALAZINE  25 mg Oral Q8H   insulin aspart  0-6 Units Subcutaneous TID WC   insulin aspart protamine- aspart  8 Units Subcutaneous Q breakfast   isosorbide mononitrate  30 mg Oral Daily   metoprolol succinate  50 mg Oral Daily   rosuvastatin  40 mg Oral Daily   sodium chloride flush  3 mL Intravenous Q12H   Continuous Infusions:  sodium chloride     cefTRIAXone (ROCEPHIN)  IV 2 g (12/29/20 1205)     LOS: 2 days   Time spent: 35 minutes   Darliss Cheney, MD [facility]  12/29/2020, 2:07 PM  Please page via [place] and do not message via secure chat for anything urgent. Secure chat can be used for anything non urgent and I will respond at my earliest availability.  How to contact the [facility] Attending or Consulting provider [place] or covering provider during after hours [place], for this patient?  Check the care team in [facility] and look for a) attending/consulting [facility] provider listed and b) the [facility] team listed. Page or secure chat [phone]. Log into [email or web address] and use [place]'s universal password to access. If you do not have the password, please contact the  hospital operator. Locate the [facility] provider you are looking for under [facility] and page to a number that you can be directly reached. If you still have difficulty reaching the provider, please page the [facility] (Director on Call) for the Hospitalists listed on amion for assistance.

## 2020-12-29 NOTE — Progress Notes (Signed)
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 Increase in Cr could also be related to diminished renal perfusion 2/2 to RAS and Afib w/ RVR -Follows w/ Dr. Neta Ehlers, last known Cr as outpatient~2.6 on 10/15/2020. Was recently discharged with a Cr of 3.4 -see below for RAS plan. Avoid RAAS blockers or MRA's -no indications for renal replacement therapy yet-no uremic symptoms, lytes stable, volume status currently stable as compared to yesterday. Suspecting dialysis is inevitable during this admission, Molly [name] and I discussed this yesterday and today and she is accepting of this -Continue to monitor daily Cr, Dose meds for GFR<15 -Monitor Daily I/Os, Daily weight  -Maintain MAP>65 for optimal renal perfusion.  -Agree with holding ACE-I, avoid further nephrotoxins including NSAIDS, Morphine.  Unless absolutely necessary, avoid CT with contrast and/or MRI with gadolinium.   Microscopic hematuria ->50 RBC's/HPF -will check ANCA and lupus serologies especially given nosebleeds -will check a renal u/s  Epistaxis -overnight 9/14, possibly related to [place]? Hep gtt on hold  Left renal artery stenosis (80-90% proximal left renal artery stenosis) -as resp status and Afib improve, considering CO2 and stent of left renal artery however on hold given the fact that there is some contrast given   AHRF, acute on chronic dCHF exacerbation -possibly flash pulm edema, lasix currently on hold given rise in Cr, agreed. Would give her a holiday for today. If still no significant urine output/worsening Cr, can try lasix challenge to help [place] determine if she is getting closer to needing HD   Hypertension: -now more so normotensive. On norvasc, metoprolol, and hydralazine   Hyponatremia -likely worsened with lasix and due to AKI (more likely) -fluid restrict 1.5L starting today   Afib w/  RVR -on amio. Hep gtt on hold given epistaxis   Anemia due to chronic kidney disease: -hgb lower now, hep gtt on hold given nosebleeds -Transfuse for Hgb<7 g/dL   Diabetes Mellitus Type 2 with Hyperglycemia -per primary    Subjective:   Had issues with nosebleeds overnight intermittently which have now resolved. Patient believes it was because her nose was too dry. She reports that her breathing is better as compared to yesterday. Hep gtt on hold now. No urine output overnight, was bladder scanned which only revealed ~60cc of urine  Does have low appetite which is unchanged but denies any fevers, chest pain, worsening SOB, swelling, n/v, dysgeusia, intractable hiccups, brain fog. Objective:   BP (!) 123/54 (BP Location: Right Arm)   Pulse 74   Temp (!) 97.5 F (36.4 C) (Oral)   Resp 16   Ht '5\' 2"'$  (1.575 m)   Wt 67 kg   SpO2 98%   BMI 27.03 kg/m   Intake/Output Summary (Last 24 hours) at 12/29/2020 [id number] Last data filed at 12/29/2020 0600 Gross per 24 hour  Intake 1314.48 ml  Output 550 ml  Net 764.48 ml   Weight change: 0.942 kg  Physical Exam: Gen:NAD, laying flat in bed CVS:rrr Resp:crackles bibasilar, normal WOB, speaking in full sentences [id number] Ext:no edema Neuro: awake, alert, speech clear and coherent, no asterxis  Imaging: ECHOCARDIOGRAM LIMITED  Result Date: 12/28/2020    ECHOCARDIOGRAM LIMITED REPORT   Patient Name:   Molly [name] Date of Exam: 12/28/2020 Medical Rec #:  [id number]    Height:       62.0 in Accession #:    [id number]  Weight:       145.7 lb Date of Birth:  05/09/1950    BSA:          1.671 m Patient Age:    70 years     BP:           125/93 mmHg Patient Gender: F            HR:           113 bpm. Exam Location:  Inpatient Procedure: Limited Echo, Limited Color Doppler and Cardiac Doppler Indications:    R07.9* Chest pain, unspecified. Elevated Troponin  History:        Patient has prior history of Echocardiogram examinations, most                  recent 11/27/2020. Risk Factors:Hypertension, Dyslipidemia and                 Diabetes.  Sonographer:    Bernadene Person RDCS Referring Phys: [id number] [place]  1. Compared with the echo [id number], systolic function is reduced. Left ventricular ejection fraction, by estimation, is 40 to 45%. The left ventricle has mildly decreased function. The left ventricle demonstrates global hypokinesis. Left ventricular diastolic parameters are indeterminate.  2. Right ventricular systolic function is normal. The right ventricular size is normal. There is moderately elevated pulmonary artery systolic pressure.  3. The pericardial effusion is circumferential.  4. The mitral valve is normal in structure. Mild mitral valve regurgitation. No evidence of mitral stenosis.  5. The aortic valve is tricuspid. There is mild calcification of the aortic valve. There is mild thickening of the aortic valve. Aortic valve regurgitation is not visualized. No aortic stenosis is present.  6. The inferior vena cava is dilated in size with <50% respiratory variability, suggesting right atrial pressure of 15 mmHg. FINDINGS  Left Ventricle: Compared with the echo [id number], systolic function is reduced. Left ventricular ejection fraction, by estimation, is 40 to 45%. The left ventricle has mildly decreased function. The left ventricle demonstrates global hypokinesis. The left ventricular internal cavity size was normal in size. There is no left ventricular hypertrophy. Left ventricular diastolic parameters are indeterminate. Right Ventricle: The right ventricular size is normal. No increase in right ventricular wall thickness. Right ventricular systolic function is normal. There is moderately elevated pulmonary artery systolic pressure. The tricuspid regurgitant velocity is 2.87 m/s, and with an assumed right atrial pressure of 15 mmHg, the estimated right ventricular systolic pressure is [id number] mmHg. Left Atrium: Left atrial size was  normal in size. Right Atrium: Right atrial size was normal in size. Pericardium: Trivial pericardial effusion is present. The pericardial effusion is circumferential. Mitral Valve: The mitral valve is normal in structure. Mild mitral annular calcification. Mild mitral valve regurgitation. No evidence of mitral valve stenosis. Tricuspid Valve: The tricuspid valve is normal in structure. Tricuspid valve regurgitation is trivial. No evidence of tricuspid stenosis. Aortic Valve: The aortic valve is tricuspid. There is mild calcification of the aortic valve. There is mild thickening of the aortic valve. Aortic valve regurgitation is not visualized. No aortic stenosis is present. Pulmonic Valve: The pulmonic valve was normal in structure. Pulmonic valve regurgitation is trivial. No evidence of pulmonic stenosis. Aorta: The aortic root is normal in size and structure. Venous: The inferior vena cava is dilated in size with less than 50% respiratory variability, suggesting right atrial pressure of 15 mmHg. IAS/Shunts: No atrial level shunt detected by color flow Doppler. LEFT VENTRICLE PLAX  2D LVIDd:         4.40 cm LVIDs:         2.05 cm LV PW:         1.00 cm LV IVS:        1.00 cm LVOT diam:     2.10 cm LV SV:         46 LV SV Index:   27 LVOT Area:     3.46 cm  LV Volumes (MOD) LV vol d, MOD A2C: 108.0 ml LV vol d, MOD A4C: 80.2 ml LV vol s, MOD A2C: 66.1 ml LV vol s, MOD A4C: 47.1 ml LV SV MOD A2C:     41.9 ml LV SV MOD A4C:     80.2 ml LV SV MOD BP:      37.5 ml RIGHT VENTRICLE TAPSE (M-mode): 1.1 cm LEFT ATRIUM         Index LA diam:    4.30 cm 2.57 cm/m  AORTIC VALVE LVOT Vmax:   87.37 cm/s LVOT Vmean:  63.833 cm/s LVOT VTI:    0.133 m  AORTA Ao Root diam: 3.20 cm MR Peak grad:    90.2 mmHg   TRICUSPID VALVE MR Mean grad:    57.0 mmHg   TR Peak grad:   32.9 mmHg MR Vmax:         475.00 cm/s TR Vmax:        287.00 cm/s MR Vmean:        357.0 cm/s MR PISA:         1.01 cm    SHUNTS MR PISA Eff ROA: 8 mm        Systemic VTI:  0.13 m MR PISA Radius:  0.40 cm     Systemic Diam: 2.10 cm Skeet Latch MD Electronically signed by Skeet Latch MD Signature Date/Time: 12/28/2020/1:40:55 PM    Final     Labs: BMET Recent Labs  Lab 12/27/20 0640 12/27/20 0847 12/28/20 0336 12/29/20 0332  NA 131* 130* 129* 126*  K 3.8 3.8 3.5 3.7  CL 91*  --  87* 84*  CO2 26  --  31 26  GLUCOSE 166*  --  177* 160*  BUN 36*  --  44* 49*  CREATININE 3.53*  --  3.79* 4.28*  CALCIUM 9.7  --  8.7* 8.2*   CBC Recent Labs  Lab 12/27/20 0640 12/27/20 0847 12/29/20 0332  WBC 8.8  --  9.0  HGB 11.4* 12.9 9.0*  HCT 35.9* 38.0 27.6*  MCV 87.3  --  83.6  PLT 369  --  296    Medications:     amiodarone  400 mg Oral BID   Followed by   Derrill Memo ON 01/05/2021] amiodarone  200 mg Oral Daily   amLODipine  10 mg Oral Daily   cilostazol  50 mg Oral BID   ezetimibe  10 mg Oral Daily   famotidine  20 mg Oral Daily   hydrALAZINE  25 mg Oral Q8H   insulin aspart  0-6 Units Subcutaneous TID WC   insulin aspart protamine- aspart  8 Units Subcutaneous Q breakfast   isosorbide mononitrate  30 mg Oral Daily   metoprolol succinate  50 mg Oral Daily   rosuvastatin  40 mg Oral Daily   sodium chloride flush  3 mL Intravenous Q12H      Gean Quint, MD [facility] 12/29/2020, 8:22 AM

## 2020-12-29 NOTE — Significant Event (Addendum)
Rapid Response Event Note   Reason for Call :  Fall  Per NT, he was walking with pt to bathroom and while pt attempting to sit on toilet, she began to lean forward and fell. Per pt, she was aware of everything that was happening and did not lose consciousness nor did she feel lightheaded/dizzy prior to event. She says she thinks she just lost her balance. Per RN and NT, pt was very lethargic following this event.  On my arrival, pt was back in bed following event.  Initial Focused Assessment:  Pt lying in bed in no visible distress. She is alert and oriented and seems to remember everything that happened. She is c/o L hip pain 9/10 and her L elbow has a skin tear on it. She is moving everything purposely and equally except for her L hip/leg which is painful.   HR-77, BP-119/57, RR-18, SpO2-91% on 4L [place]   Interventions:  CBG-90 Post fall bundle  Plan of Care:  Notify provider of event including L hip pain. Continue to monitor pt closely. Call RRT if further assistance needed.    Event Summary:   MD Notified: Bedside RN to notify  Call [place] Time:[phone]  Dillard Essex, RN

## 2020-12-29 NOTE — Progress Notes (Addendum)
Patient ambulated to the bathroom by Robin NT.  Patient attempted to lower herself to the toilet, but was unsuccessful.  Pt stated she thought she grabbed the rail.  Patient fell on her left side.  Patient didn't hit her head.  Patient assisted to a chair by myself and other staff members.  Patient was transferred from the chair to the bed.  Patient's vitals were taken and they were stable.  Patient is alert and oriented x4.  Dr. Doristine Bosworth paged.  Night shift hospitalist paged as well.  Charge Nurse notified.  Night shift nurse notified.  Awaiting MD to page back for orders.  12/29/20 1853  What Happened  Was fall witnessed? Yes  Who witnessed fall? Robin NT  Patients activity before fall ambulating-assisted  Point of contact hip/leg  Was patient injured? Unsure  Follow Up  MD notified Dr. Doristine Bosworth  Time MD notified [phone]  Family notified Yes - comment (husband at the bedside)  Time family notified 1858  Additional tests No  Progress note created (see row info) Yes  Adult Fall Risk Assessment  Risk Factor Category (scoring not indicated) Fall has occurred during this admission (document High fall risk)  Age 70  Fall History: Fall within 6 months prior to admission 0  Elimination; Bowel and/or Urine Incontinence 2  Elimination; Bowel and/or Urine Urgency/Frequency 0  Medications: includes PCA/Opiates, Anti-convulsants, Anti-hypertensives, Diuretics, Hypnotics, Laxatives, Sedatives, and Psychotropics 5  Patient Care Equipment 2  Mobility-Assistance 2  Mobility-Gait 2  Mobility-Sensory Deficit 0  Altered awareness of immediate physical environment 0  Impulsiveness 0  Lack of understanding of one's physical/cognitive limitations 0  Total Score 14  Patient Fall Risk Level High fall risk  Adult Fall Risk Interventions  Required Bundle Interventions *See Row Information* High fall risk - low, moderate, and high requirements implemented  Additional Interventions Use of appropriate toileting  equipment (bedpan, BSC, etc.)  Screening for Fall Injury Risk (To be completed on HIGH fall risk patients) - Assessing Need for Floor Mats  Risk For Fall Injury- Criteria for Floor Mats None identified - No additional interventions needed  Vitals  BP (!) 119/57  MAP (mmHg) 76  BP Method Automatic  Pulse Rate 77  Oxygen Therapy  SpO2 91 %  O2 Device Nasal Cannula  O2 Flow Rate (L/min) 4 L/min  Pain Assessment  Pain Scale 0-10  Pain Score 4  Pain Type Acute pain  Pain Location Hip  Pain Orientation Left  Neurological  Neuro (WDL) WDL  Level of Consciousness Alert  Orientation Level Oriented X4  Cognition Follows commands  Speech Clear  Musculoskeletal  Musculoskeletal (WDL) X  Assistive Device Front wheel walker  Generalized Weakness Yes  Weight Bearing Restrictions No  Integumentary  Integumentary (WDL) WDL  Pain Assessment  Date Pain First Started 12/29/20  Result of Injury Yes  Pain Assessment  Work-Related Injury No

## 2020-12-29 NOTE — Progress Notes (Signed)
Patient with no urine output during this shift.  Patient has no urge to go and bladder largest bladder scan volume was 53m.

## 2020-12-29 NOTE — Progress Notes (Signed)
Cardiology Progress Note  Patient ID: Molly [name] MRN: [id number] DOB: Dec 27, 1950 Date of Encounter: 12/29/2020  Primary Cardiologist: Jenne Campus, MD  Subjective   Chief Complaint: Shortness of breath  HPI: Echo shows she is volume overloaded.  Still with significant crackles at the lung bases.  EF is reduced.  She reports nosebleeds overnight.  Hemoglobin is dry.  Heparin has been stopped.  Kidney function continues to decline.  ROS:  All other ROS reviewed and negative. Pertinent positives noted in the HPI.     Inpatient Medications  Scheduled Meds:  amLODipine  10 mg Oral Daily   cilostazol  50 mg Oral BID   ezetimibe  10 mg Oral Daily   famotidine  20 mg Oral Daily   hydrALAZINE  25 mg Oral Q12H   insulin aspart  0-6 Units Subcutaneous TID WC   insulin aspart protamine- aspart  8 Units Subcutaneous Q breakfast   metoprolol tartrate  25 mg Oral Q6H   rosuvastatin  40 mg Oral Daily   sodium chloride flush  3 mL Intravenous Q12H   Continuous Infusions:  sodium chloride     amiodarone 30 mg/hr (12/29/20 0000)   cefTRIAXone (ROCEPHIN)  IV 2 g (12/28/20 1525)   heparin 1,000 Units/hr (12/29/20 0458)   PRN Meds: sodium chloride, acetaminophen, hydrocortisone cream, loperamide, loratadine, sodium chloride, sodium chloride flush   Vital Signs   Vitals:   12/28/20 2136 12/28/20 2343 12/29/20 0504 12/29/20 0509  BP: 112/67 (!) 112/56  (!) 123/54  Pulse:  80  74  Resp:  18  16  Temp:  97.6 F (36.4 C)  (!) 97.5 F (36.4 C)  TempSrc:  Oral  Oral  SpO2:  92%  98%  Weight:   67 kg   Height:        Intake/Output Summary (Last 24 hours) at 12/29/2020 0747 Last data filed at 12/29/2020 0600 Gross per 24 hour  Intake 1314.48 ml  Output 550 ml  Net 764.48 ml   Last 3 Weights 12/29/2020 12/28/2020 12/27/2020  Weight (lbs) 147 lb 12.8 oz 145 lb 11.6 oz 145 lb 11.6 oz  Weight (kg) 67.042 kg 66.1 kg 66.1 kg      Telemetry  Overnight telemetry shows sinus rhythm in the  70s, which I personally reviewed.   Physical Exam   Vitals:   12/28/20 2136 12/28/20 2343 12/29/20 0504 12/29/20 0509  BP: 112/67 (!) 112/56  (!) 123/54  Pulse:  80  74  Resp:  18  16  Temp:  97.6 F (36.4 C)  (!) 97.5 F (36.4 C)  TempSrc:  Oral  Oral  SpO2:  92%  98%  Weight:   67 kg   Height:        Intake/Output Summary (Last 24 hours) at 12/29/2020 0747 Last data filed at 12/29/2020 0600 Gross per 24 hour  Intake 1314.48 ml  Output 550 ml  Net 764.48 ml    Last 3 Weights 12/29/2020 12/28/2020 12/27/2020  Weight (lbs) 147 lb 12.8 oz 145 lb 11.6 oz 145 lb 11.6 oz  Weight (kg) 67.042 kg 66.1 kg 66.1 kg    Body mass index is 27.03 kg/m.   General: Well nourished, well developed, in no acute distress Head: Atraumatic, normal size  Eyes: PEERLA, EOMI  Neck: Supple, JVD 7 8 cm of water Endocrine: No thryomegaly Cardiac: Normal S1, S2; RRR; no murmurs, rubs, or gallops Lungs: Crackles up to the mid lung fields Abd: Soft, nontender, no hepatomegaly  Ext:  No edema, pulses 2+ Musculoskeletal: No deformities, BUE and BLE strength normal and equal Skin: Warm and dry, no rashes   Neuro: Alert and oriented to person, place, time, and situation, CNII-XII grossly intact, no focal deficits  Psych: Normal mood and affect   Labs  High Sensitivity Troponin:   Recent Labs  Lab 12/27/20 0640 12/27/20 0835  TROPONINIHS 397* 376*     Cardiac EnzymesNo results for input(s): TROPONINI in the last 168 hours. No results for input(s): TROPIPOC in the last 168 hours.  Chemistry Recent Labs  Lab 12/27/20 0640 12/27/20 0847 12/28/20 0336 12/29/20 0332  NA 131* 130* 129* 126*  K 3.8 3.8 3.5 3.7  CL 91*  --  87* 84*  CO2 26  --  31 26  GLUCOSE 166*  --  177* 160*  BUN 36*  --  44* 49*  CREATININE 3.53*  --  3.79* 4.28*  CALCIUM 9.7  --  8.7* 8.2*  GFRNONAA 14*  --  12* 11*  ANIONGAP 14  --  11 16*    Hematology Recent Labs  Lab 12/27/20 0640 12/27/20 0847 12/29/20 0332  WBC  8.8  --  9.0  RBC 4.11  --  3.30*  HGB 11.4* 12.9 9.0*  HCT 35.9* 38.0 27.6*  MCV 87.3  --  83.6  MCH 27.7  --  27.3  MCHC 31.8  --  32.6  RDW 14.4  --  14.4  PLT 369  --  296   BNP Recent Labs  Lab 12/27/20 0835  BNP 1,102.1*    DDimer No results for input(s): DDIMER in the last 168 hours.   Radiology  ECHOCARDIOGRAM LIMITED  Result Date: 12/28/2020    ECHOCARDIOGRAM LIMITED REPORT   Patient Name:   Molly [name] Date of Exam: 12/28/2020 Medical Rec #:  [id number]    Height:       62.0 in Accession #:    [id number]   Weight:       145.7 lb Date of Birth:  Aug 26, 1950    BSA:          1.671 m Patient Age:    70 years     BP:           125/93 mmHg Patient Gender: F            HR:           113 bpm. Exam Location:  Inpatient Procedure: Limited Echo, Limited Color Doppler and Cardiac Doppler Indications:    R07.9* Chest pain, unspecified. Elevated Troponin  History:        Patient has prior history of Echocardiogram examinations, most                 recent 11/27/2020. Risk Factors:Hypertension, Dyslipidemia and                 Diabetes.  Sonographer:    Bernadene Person RDCS Referring Phys: [id number] [place]  1. Compared with the echo A999333, systolic function is reduced. Left ventricular ejection fraction, by estimation, is 40 to 45%. The left ventricle has mildly decreased function. The left ventricle demonstrates global hypokinesis. Left ventricular diastolic parameters are indeterminate.  2. Right ventricular systolic function is normal. The right ventricular size is normal. There is moderately elevated pulmonary artery systolic pressure.  3. The pericardial effusion is circumferential.  4. The mitral valve is normal in structure. Mild mitral valve regurgitation. No evidence of mitral stenosis.  5. The aortic valve  is tricuspid. There is mild calcification of the aortic valve. There is mild thickening of the aortic valve. Aortic valve regurgitation is not visualized. No aortic  stenosis is present.  6. The inferior vena cava is dilated in size with <50% respiratory variability, suggesting right atrial pressure of 15 mmHg. FINDINGS  Left Ventricle: Compared with the echo A999333, systolic function is reduced. Left ventricular ejection fraction, by estimation, is 40 to 45%. The left ventricle has mildly decreased function. The left ventricle demonstrates global hypokinesis. The left ventricular internal cavity size was normal in size. There is no left ventricular hypertrophy. Left ventricular diastolic parameters are indeterminate. Right Ventricle: The right ventricular size is normal. No increase in right ventricular wall thickness. Right ventricular systolic function is normal. There is moderately elevated pulmonary artery systolic pressure. The tricuspid regurgitant velocity is 2.87 m/s, and with an assumed right atrial pressure of 15 mmHg, the estimated right ventricular systolic pressure is [id number] mmHg. Left Atrium: Left atrial size was normal in size. Right Atrium: Right atrial size was normal in size. Pericardium: Trivial pericardial effusion is present. The pericardial effusion is circumferential. Mitral Valve: The mitral valve is normal in structure. Mild mitral annular calcification. Mild mitral valve regurgitation. No evidence of mitral valve stenosis. Tricuspid Valve: The tricuspid valve is normal in structure. Tricuspid valve regurgitation is trivial. No evidence of tricuspid stenosis. Aortic Valve: The aortic valve is tricuspid. There is mild calcification of the aortic valve. There is mild thickening of the aortic valve. Aortic valve regurgitation is not visualized. No aortic stenosis is present. Pulmonic Valve: The pulmonic valve was normal in structure. Pulmonic valve regurgitation is trivial. No evidence of pulmonic stenosis. Aorta: The aortic root is normal in size and structure. Venous: The inferior vena cava is dilated in size with less than 50% respiratory variability,  suggesting right atrial pressure of 15 mmHg. IAS/Shunts: No atrial level shunt detected by color flow Doppler. LEFT VENTRICLE PLAX 2D LVIDd:         4.40 cm LVIDs:         2.05 cm LV PW:         1.00 cm LV IVS:        1.00 cm LVOT diam:     2.10 cm LV SV:         46 LV SV Index:   27 LVOT Area:     3.46 cm  LV Volumes (MOD) LV vol d, MOD A2C: 108.0 ml LV vol d, MOD A4C: 80.2 ml LV vol s, MOD A2C: 66.1 ml LV vol s, MOD A4C: 47.1 ml LV SV MOD A2C:     41.9 ml LV SV MOD A4C:     80.2 ml LV SV MOD BP:      37.5 ml RIGHT VENTRICLE TAPSE (M-mode): 1.1 cm LEFT ATRIUM         Index LA diam:    4.30 cm 2.57 cm/m  AORTIC VALVE LVOT Vmax:   87.37 cm/s LVOT Vmean:  63.833 cm/s LVOT VTI:    0.133 m  AORTA Ao Root diam: 3.20 cm MR Peak grad:    90.2 mmHg   TRICUSPID VALVE MR Mean grad:    57.0 mmHg   TR Peak grad:   32.9 mmHg MR Vmax:         475.00 cm/s TR Vmax:        287.00 cm/s MR Vmean:        357.0 cm/s MR PISA:  1.01 cm    SHUNTS MR PISA Eff ROA: 8 mm       Systemic VTI:  0.13 m MR PISA Radius:  0.40 cm     Systemic Diam: 2.10 cm Skeet Latch MD Electronically signed by Skeet Latch MD Signature Date/Time: 12/28/2020/1:40:55 PM    Final     Cardiac Studies  TTE 12/28/2020  1. Compared with the echo A999333, systolic function is reduced. Left  ventricular ejection fraction, by estimation, is 40 to 45%. The left  ventricle has mildly decreased function. The left ventricle demonstrates  global hypokinesis. Left ventricular  diastolic parameters are indeterminate.   2. Right ventricular systolic function is normal. The right ventricular  size is normal. There is moderately elevated pulmonary artery systolic  pressure.   3. The pericardial effusion is circumferential.   4. The mitral valve is normal in structure. Mild mitral valve  regurgitation. No evidence of mitral stenosis.   5. The aortic valve is tricuspid. There is mild calcification of the  aortic valve. There is mild thickening of the  aortic valve. Aortic valve  regurgitation is not visualized. No aortic stenosis is present.   6. The inferior vena cava is dilated in size with <50% respiratory  variability, suggesting right atrial pressure of 15 mmHg.   Patient Profile  70 year old female with history of CKD stage IV, diastolic heart failure, PAD (bilateral SFA CTO's), left renal artery stenosis (90%), diabetes, CAD who was admitted on 12/27/2020 with chest pain and acute hypoxic respiratory failure secondary to pulmonary edema from likely hypertensive crisis.  Course complicated by A. fib with RVR 12/28/2020.   Assessment & Plan   #Atrial fibrillation with RVR -Developed overnight 12/28/2020.  No prior history of this.  Rates were difficult to control.  She was started on amiodarone and has converted back to sinus rhythm. -Transition to oral amiodarone.  400 mg twice daily for 7 days and then 200 mg daily for 21 days.  Amiodarone would not be a long-term therapy for her.  This will just help [place] get her through her critical illness. -She did have nosebleeds overnight.  Hemoglobin is dropped to 9.  I have stopped her heparin drip.  #Acute blood loss anemia #Nosebleed -Hemoglobin dropped to 9.  I stopped her heparin drip. -She may need ENT evaluation, but bleeding appears to have stopped.  #Acute hypoxic respiratory failure #Flash pulmonary edema, recurrent #Hypertensive crisis #CKD stage IV #Left renal artery stenosis #Systolic heart failure, EF 40 to 45% #Volume overload/moderate pulmonary hypertension -Admitted with recurrent flash pulmonary edema in the setting of hypertensive crisis.  Has a 52 to 90% proximal left renal artery stenosis that likely needs to be addressed.  Given rising creatinine and poor kidney function she is not a candidate for this currently.  We have canceled her procedure for today.  CO2 can be used during the procedure however I was informed by interventional cardiology that there is some contrast  given.  Given her AKI we will not pursue this procedure. -EF is now reduced which I suspect could be A. fib related. -She is very volume overloaded per results of echo with moderate pulm hypertension.  Urine output is minimal.  Renal was giving her a diuresis holiday.  I fear she may end up needing dialysis.  -For now we cannot pursue Lasix therapy.  We will follow nephrology recommendations. -Transition to metoprolol succinate 50 mg daily.  Not a candidate for ACE/ARB/Arni/MRA given CKD stage IV on 5.  I  have increased her hydralazine to 25 mg 3 times daily.  Imdur 30 mg daily has been added.  #Elevated troponin #Demand ischemia #Coronary calcifications on prior chest CT -EKG without ischemic changes.  Troponin elevation is minimal and flat.  Suspect this is demand. -Continue aspirin and statin therapy. -Do not suspect ACS.  For questions or updates, please contact [place] Please consult [email or web address] for contact info under   Time Spent with Patient: I have spent a total of 35 minutes with patient reviewing hospital notes, telemetry, EKGs, labs and examining the patient as well as establishing an assessment and plan that was discussed with the patient.  > 50% of time was spent in direct patient care.    Signed, Addison Naegeli. Audie Box, MD, [place]  12/29/2020 7:47 AM

## 2020-12-29 NOTE — Progress Notes (Signed)
Heart Failure Navigator Progress Note  Assessed for Heart & Vascular TOC clinic readiness.  Patient does not meet criteria due to SCr >4 today. Per nephrology note: outpt SCr ~2.6, suspecting need for dialysis this admission.   Navigator available for reassessment of patient.   Pricilla Holm, MSN, RN Heart Failure Nurse Navigator [phone]

## 2020-12-30 ENCOUNTER — Inpatient Hospital Stay (HOSPITAL_COMMUNITY): Payer: Medicare Other

## 2020-12-30 ENCOUNTER — Encounter (HOSPITAL_COMMUNITY): Payer: Self-pay | Admitting: Internal Medicine

## 2020-12-30 ENCOUNTER — Inpatient Hospital Stay (HOSPITAL_COMMUNITY): Payer: Medicare Other | Admitting: Certified Registered"

## 2020-12-30 ENCOUNTER — Encounter (HOSPITAL_COMMUNITY)
Admission: EM | Disposition: A | Discharge status: Home Health | Payer: Self-pay | Source: Home / Self Care | Attending: Family Medicine

## 2020-12-30 DIAGNOSIS — J9621 Acute and chronic respiratory failure with hypoxia: Secondary | ICD-10-CM | POA: Diagnosis not present

## 2020-12-30 DIAGNOSIS — J9622 Acute and chronic respiratory failure with hypercapnia: Secondary | ICD-10-CM | POA: Diagnosis not present

## 2020-12-30 HISTORY — PX: CYSTOSCOPY W/ URETERAL STENT PLACEMENT: SHX1429

## 2020-12-30 LAB — GLUCOSE, CAPILLARY
Glucose-Capillary: 112 mg/dL — ABNORMAL HIGH (ref 70–99)
Glucose-Capillary: 117 mg/dL — ABNORMAL HIGH (ref 70–99)
Glucose-Capillary: 124 mg/dL — ABNORMAL HIGH (ref 70–99)
Glucose-Capillary: 110 mg/dL — ABNORMAL HIGH (ref 70–99)
Glucose-Capillary: 93 mg/dL (ref 70–99)
Glucose-Capillary: 126 mg/dL — ABNORMAL HIGH (ref 70–99)

## 2020-12-30 LAB — BASIC METABOLIC PANEL
Anion gap: 17 — ABNORMAL HIGH (ref 5–15)
BUN: 58 mg/dL — ABNORMAL HIGH (ref 8–23)
CO2: 25 mmol/L (ref 22–32)
Calcium: 8 mg/dL — ABNORMAL LOW (ref 8.9–10.3)
Chloride: 85 mmol/L — ABNORMAL LOW (ref 98–111)
Creatinine, Ser: 4.92 mg/dL — ABNORMAL HIGH (ref 0.44–1.00)
GFR, Estimated: 9 mL/min — ABNORMAL LOW (ref >60)
Glucose, Bld: 118 mg/dL — ABNORMAL HIGH (ref 70–99)
Potassium: 3.9 mmol/L (ref 3.5–5.1)
Sodium: 127 mmol/L — ABNORMAL LOW (ref 135–145)

## 2020-12-30 LAB — CBC
HCT: 25.9 % — ABNORMAL LOW (ref 36.0–46.0)
Hemoglobin: 8.3 g/dL — ABNORMAL LOW (ref 12.0–15.0)
MCH: 27.3 pg (ref 26.0–34.0)
MCHC: 32 g/dL (ref 30.0–36.0)
MCV: 85.2 fL (ref 80.0–100.0)
Platelets: 316 10*3/uL (ref 150–400)
RBC: 3.04 MIL/uL — ABNORMAL LOW (ref 3.87–5.11)
RDW: 14.4 % (ref 11.5–15.5)
WBC: 8.5 10*3/uL (ref 4.0–10.5)
nRBC: 0 % (ref 0.0–0.2)

## 2020-12-30 LAB — ANA W/REFLEX IF POSITIVE: Anti Nuclear Antibody (ANA): NEGATIVE

## 2020-12-30 LAB — C4 COMPLEMENT: Complement C4, Body Fluid: 39 mg/dL — ABNORMAL HIGH (ref 12–38)

## 2020-12-30 LAB — ANCA TITERS
Atypical P-ANCA titer: <1:20 {titer}
C-ANCA: <1:20 {titer}
P-ANCA: <1:20 {titer}

## 2020-12-30 LAB — ANTI-DNA ANTIBODY, DOUBLE-STRANDED: ds DNA Ab: <1 IU/mL (ref 0–9)

## 2020-12-30 LAB — C3 COMPLEMENT: C3 Complement: 140 mg/dL (ref 82–167)

## 2020-12-30 SURGERY — CYSTOSCOPY, WITH RETROGRADE PYELOGRAM AND URETERAL STENT INSERTION
Anesthesia: General | Laterality: Left

## 2020-12-30 MED ORDER — OXYCODONE HCL 5 MG/5ML PO SOLN: 5.0000 mg | Freq: Once | ORAL | Status: DC | PRN

## 2020-12-30 MED ORDER — FENTANYL CITRATE (PF) 100 MCG/2ML IJ SOLN
INTRAMUSCULAR | Status: AC
  Filled 2020-12-30: qty 2

## 2020-12-30 MED ORDER — PROPOFOL 10 MG/ML IV BOLUS
INTRAVENOUS | Status: DC | PRN
  Administered 2020-12-30: 150 mg via INTRAVENOUS

## 2020-12-30 MED ORDER — LACTATED RINGERS IV SOLN: INTRAVENOUS | Status: DC

## 2020-12-30 MED ORDER — ONDANSETRON HCL 4 MG/2ML IJ SOLN
INTRAMUSCULAR | Status: DC | PRN
  Administered 2020-12-30: 4 mg via INTRAVENOUS

## 2020-12-30 MED ORDER — MORPHINE SULFATE (PF) 2 MG/ML IV SOLN
0.5000 mg | Freq: Once | INTRAVENOUS | Status: AC
  Administered 2020-12-30: 0.5 mg via INTRAVENOUS
  Filled 2020-12-30: qty 1

## 2020-12-30 MED ORDER — PANTOPRAZOLE SODIUM 40 MG IV SOLR
40.0000 mg | Freq: Every day | INTRAVENOUS | Status: DC
  Administered 2020-12-30 – 2021-01-04 (×6): 40 mg via INTRAVENOUS
  Filled 2020-12-30 (×6): qty 40

## 2020-12-30 MED ORDER — OXYCODONE HCL 5 MG PO TABS: 5.0000 mg | ORAL_TABLET | Freq: Once | ORAL | Status: DC | PRN

## 2020-12-30 MED ORDER — EPHEDRINE SULFATE 50 MG/ML IJ SOLN
INTRAMUSCULAR | Status: DC | PRN
  Administered 2020-12-30 (×2): 10 mg via INTRAVENOUS
  Administered 2020-12-30: 5 mg via INTRAVENOUS

## 2020-12-30 MED ORDER — CHLORHEXIDINE GLUCONATE 0.12 % MT SOLN
OROMUCOSAL | Status: AC
  Administered 2020-12-30: 15 mL via OROMUCOSAL
  Filled 2020-12-30: qty 15

## 2020-12-30 MED ORDER — CHLORHEXIDINE GLUCONATE 0.12 % MT SOLN: 15.0000 mL | Freq: Once | OROMUCOSAL | Status: AC

## 2020-12-30 MED ORDER — FENTANYL CITRATE (PF) 100 MCG/2ML IJ SOLN
INTRAMUSCULAR | Status: DC | PRN
  Administered 2020-12-30: 25 ug via INTRAVENOUS

## 2020-12-30 MED ORDER — FENTANYL CITRATE (PF) 100 MCG/2ML IJ SOLN: 25.0000 ug | INTRAMUSCULAR | Status: DC | PRN

## 2020-12-30 MED ORDER — ALBUMIN HUMAN 5 % IV SOLN
12.5000 g | Freq: Once | INTRAVENOUS | Status: AC
  Administered 2020-12-30: 12.5 g via INTRAVENOUS

## 2020-12-30 MED ORDER — IOHEXOL 300 MG/ML  SOLN
INTRAMUSCULAR | Status: DC | PRN
  Administered 2020-12-30: 4 mL via URETHRAL

## 2020-12-30 MED ORDER — PHENYLEPHRINE HCL (PRESSORS) 10 MG/ML IV SOLN
INTRAVENOUS | Status: DC | PRN
  Administered 2020-12-30: 80 ug via INTRAVENOUS
  Administered 2020-12-30 (×2): 160 ug via INTRAVENOUS

## 2020-12-30 MED ORDER — PROPOFOL 10 MG/ML IV BOLUS
INTRAVENOUS | Status: AC
  Filled 2020-12-30: qty 20

## 2020-12-30 MED ORDER — ALBUMIN HUMAN 5 % IV SOLN
INTRAVENOUS | Status: AC
  Filled 2020-12-30: qty 250

## 2020-12-30 MED ORDER — VASOPRESSIN 20 UNIT/ML IV SOLN
INTRAVENOUS | Status: AC
  Filled 2020-12-30: qty 1

## 2020-12-30 MED ORDER — METHOCARBAMOL 500 MG PO TABS
500.0000 mg | ORAL_TABLET | Freq: Once | ORAL | Status: AC
  Administered 2020-12-30: 500 mg via ORAL
  Filled 2020-12-30: qty 1

## 2020-12-30 MED ORDER — ORAL CARE MOUTH RINSE: 15.0000 mL | Freq: Once | OROMUCOSAL | Status: AC

## 2020-12-30 MED ORDER — HYDROMORPHONE HCL 1 MG/ML IJ SOLN
0.5000 mg | INTRAMUSCULAR | Status: DC | PRN
  Administered 2020-12-31 – 2021-01-08 (×8): 0.5 mg via INTRAVENOUS
  Filled 2020-12-30 (×8): qty 0.5

## 2020-12-30 MED ORDER — LIDOCAINE 2% (20 MG/ML) 5 ML SYRINGE
INTRAMUSCULAR | Status: DC | PRN
  Administered 2020-12-30: 60 mg via INTRAVENOUS

## 2020-12-30 MED ORDER — SODIUM CHLORIDE 0.9 % IV SOLN
INTRAVENOUS | Status: DC | PRN
  Administered 2020-12-30: 30 ug/min via INTRAVENOUS

## 2020-12-30 MED ORDER — SODIUM CHLORIDE 0.9 % IR SOLN
Status: DC | PRN
  Administered 2020-12-30: 3000 mL via INTRAVESICAL

## 2020-12-30 MED ORDER — SODIUM CHLORIDE 0.9 % IV SOLN
1.0000 g | INTRAVENOUS | Status: DC
  Filled 2020-12-30: qty 10

## 2020-12-30 MED ORDER — VASOPRESSIN 20 UNIT/ML IV SOLN
INTRAVENOUS | Status: DC | PRN
  Administered 2020-12-30: 1 [IU] via INTRAVENOUS

## 2020-12-30 SURGICAL SUPPLY — 18 items
BAG URO CATCHER STRL LF (MISCELLANEOUS) ×2 IMPLANT
CATH FOLEY 2WAY 5CC 16FR (CATHETERS)
CATH INTERMIT  6FR 70CM (CATHETERS) ×2 IMPLANT
CATH URET 5FR 28IN OPEN ENDED (CATHETERS) IMPLANT
CATH URTH STD 16FR FL 2W DRN (CATHETERS) IMPLANT
GLOVE SURG ENC TEXT LTX SZ7 (GLOVE) ×2 IMPLANT
GOWN STRL REUS W/TWL LRG LVL3 (GOWN DISPOSABLE) ×2 IMPLANT
GUIDEWIRE STR DUAL SENSOR (WIRE) ×4 IMPLANT
IV CATH 14GX2 1/4 (CATHETERS) IMPLANT
IV NS 1000ML (IV SOLUTION)
IV NS 1000ML BAXH (IV SOLUTION) IMPLANT
KIT TURNOVER KIT A (KITS) ×2 IMPLANT
MANIFOLD NEPTUNE II (INSTRUMENTS) ×2 IMPLANT
PACK CYSTO (CUSTOM PROCEDURE TRAY) ×2 IMPLANT
SLEEVE SCD COMPRESS KNEE MED (STOCKING) ×2 IMPLANT
STENT CONTOUR 6FRX26X.038 (STENTS) ×2 IMPLANT
STENT URO INLAY 6FRX24CM (STENTS) ×2 IMPLANT
TUBING CONNECTING 10 (TUBING) ×2 IMPLANT

## 2020-12-30 NOTE — Anesthesia Preprocedure Evaluation (Addendum)
Anesthesia Evaluation  Patient identified by MRN, date of birth, ID band Patient awake    Reviewed: Allergy & Precautions, NPO status , Patient's Chart, lab work & pertinent test results  Airway Mallampati: III  TM Distance: >3 FB Neck ROM: Full  Mouth opening: Limited Mouth Opening  Dental no notable dental hx.    Pulmonary    Pulmonary exam normal breath sounds clear to auscultation + decreased breath sounds      Cardiovascular hypertension, Pt. on medications + CAD, + Peripheral Vascular Disease and +CHF  Normal cardiovascular exam+ dysrhythmias (afib with RVR on 12/28/20 started on amiodarone) Atrial Fibrillation  Rhythm:Regular Rate:Normal  TTE 12/28/2020 1. Compared with the echo A999333, systolic function is reduced. Left  ventricular ejection fraction, by estimation, is 40 to 45%. The left  ventricle has mildly decreased function. The left ventricle demonstrates  global hypokinesis. Left ventricular  diastolic parameters are indeterminate.  2. Right ventricular systolic function is normal. The right ventricular  size is normal. There is moderately elevated pulmonary artery systolic  pressure.  3. The pericardial effusion is circumferential.  4. The mitral valve is normal in structure. Mild mitral valve  regurgitation. No evidence of mitral stenosis.  5. The aortic valve is tricuspid. There is mild calcification of the  aortic valve. There is mild thickening of the aortic valve. Aortic valve  regurgitation is not visualized. No aortic stenosis is present.  6. The inferior vena cava is dilated in size with <50% respiratory  variability, suggesting right atrial pressure of 15 mmHg.    Neuro/Psych  Neuromuscular disease (external opthalmoplegia) negative psych ROS   GI/Hepatic GERD  ,dysphagia   Endo/Other  diabetes, Poorly ControlledDyslipidemia Chronic hyponatremia  Renal/GU Renal disease (renal artery stenosis)   negative genitourinary   Musculoskeletal negative musculoskeletal ROS (+)   Abdominal   Peds negative pediatric ROS (+)  Hematology  (+) anemia ,   Anesthesia Other Findings Left hip pain after fall in bathroom last night. Per patient and family, no fracture noted on xray.   Reproductive/Obstetrics negative OB ROS                          Anesthesia Physical Anesthesia Plan  ASA: 3  Anesthesia Plan: General   Post-op Pain Management:    Induction: Intravenous  PONV Risk Score and Plan: 3 and Treatment may vary due to age or medical condition, Ondansetron and Dexamethasone  Airway Management Planned: LMA  Additional Equipment: None  Intra-op Plan:   Post-operative Plan: Extubation in OR  Informed Consent: I have reviewed the patients History and Physical, chart, labs and discussed the procedure including the risks, benefits and alternatives for the proposed anesthesia with the patient or authorized representative who has indicated his/her understanding and acceptance.     Dental advisory given  Plan Discussed with: CRNA, Anesthesiologist and Surgeon  Anesthesia Plan Comments: (LMA)       Anesthesia Quick Evaluation

## 2020-12-30 NOTE — Progress Notes (Signed)
Cardiology Progress Note  Patient ID: Molly [name] MRN: [id number] DOB: 09/21/1950 Date of Encounter: 12/30/2020  Primary Cardiologist: Jenne Campus, MD  Subjective   Chief Complaint: Shortness of breath  HPI: 6 mm ureteral stone.  Possibly this is the explanation for her kidney failure with obstructive uropathy.  Plans for stent placement today.  Hemoglobin continues to decline.  Mention of gastritis on CT scan.  Hyponatremia is worse likely from volume overload.  ROS:  All other ROS reviewed and negative. Pertinent positives noted in the HPI.     Inpatient Medications  Scheduled Meds:  amiodarone  400 mg Oral BID   Followed by   Derrill Memo ON 01/05/2021] amiodarone  200 mg Oral Daily   amLODipine  10 mg Oral Daily   cilostazol  50 mg Oral BID   ezetimibe  10 mg Oral Daily   famotidine  20 mg Oral Daily   hydrALAZINE  25 mg Oral Q8H   insulin aspart  0-6 Units Subcutaneous TID WC   insulin aspart protamine- aspart  8 Units Subcutaneous Q breakfast   isosorbide mononitrate  30 mg Oral Daily   metoprolol succinate  50 mg Oral Daily   rosuvastatin  40 mg Oral Daily   sodium chloride flush  3 mL Intravenous Q12H   Continuous Infusions:  sodium chloride     cefTRIAXone (ROCEPHIN)  IV 2 g (12/30/20 0918)   PRN Meds: sodium chloride, acetaminophen, hydrocortisone cream, loperamide, loratadine, sodium chloride, sodium chloride flush   Vital Signs   Vitals:   12/29/20 2337 12/30/20 0223 12/30/20 0558 12/30/20 0809  BP: 103/71  (!) 134/57 (!) 111/55  Pulse: 78  82 81  Resp: 20  (!) 22   Temp: 97.6 F (36.4 C)  98.7 F (37.1 C) (!) 97.5 F (36.4 C)  TempSrc: Oral  Oral Oral  SpO2: 91%  92% 93%  Weight:  67 kg    Height:        Intake/Output Summary (Last 24 hours) at 12/30/2020 0949 Last data filed at 12/30/2020 0634 Gross per 24 hour  Intake 343 ml  Output 75 ml  Net 268 ml   Last 3 Weights 12/30/2020 12/29/2020 12/28/2020  Weight (lbs) 147 lb 11.3 oz 147 lb 12.8 oz  145 lb 11.6 oz  Weight (kg) 67 kg 67.042 kg 66.1 kg      Telemetry  Overnight telemetry shows sinus rhythm in the 70s, which I personally reviewed.   Physical Exam   Vitals:   12/29/20 2337 12/30/20 0223 12/30/20 0558 12/30/20 0809  BP: 103/71  (!) 134/57 (!) 111/55  Pulse: 78  82 81  Resp: 20  (!) 22   Temp: 97.6 F (36.4 C)  98.7 F (37.1 C) (!) 97.5 F (36.4 C)  TempSrc: Oral  Oral Oral  SpO2: 91%  92% 93%  Weight:  67 kg    Height:        Intake/Output Summary (Last 24 hours) at 12/30/2020 0949 Last data filed at 12/30/2020 0634 Gross per 24 hour  Intake 343 ml  Output 75 ml  Net 268 ml    Last 3 Weights 12/30/2020 12/29/2020 12/28/2020  Weight (lbs) 147 lb 11.3 oz 147 lb 12.8 oz 145 lb 11.6 oz  Weight (kg) 67 kg 67.042 kg 66.1 kg    Body mass index is 27.02 kg/m.  General: Ill-appearing Head: Atraumatic, normal size  Eyes: PEERLA, EOMI  Neck: Supple, JVD 7 to 8 cm water Endocrine: No thryomegaly Cardiac: Normal S1,  S2; RRR; no murmurs, rubs, or gallops Lungs: Crackles at the lung bases Abd: Soft, nontender, no hepatomegaly  Ext: No edema, pulses 2+ Musculoskeletal: No deformities, BUE and BLE strength normal and equal Skin: Warm and dry, no rashes   Neuro: Alert and oriented to person, place, time, and situation, CNII-XII grossly intact, no focal deficits  Psych: Normal mood and affect   Labs  High Sensitivity Troponin:   Recent Labs  Lab 12/27/20 0640 12/27/20 0835  TROPONINIHS 397* 376*     Cardiac EnzymesNo results for input(s): TROPONINI in the last 168 hours. No results for input(s): TROPIPOC in the last 168 hours.  Chemistry Recent Labs  Lab 12/28/20 0336 12/29/20 0332 12/30/20 0353  NA 129* 126* 127*  K 3.5 3.7 3.9  CL 87* 84* 85*  CO2 '31 26 25  '$ GLUCOSE 177* 160* 118*  BUN 44* 49* 58*  CREATININE 3.79* 4.28* 4.92*  CALCIUM 8.7* 8.2* 8.0*  GFRNONAA 12* 11* 9*  ANIONGAP 11 16* 17*    Hematology Recent Labs  Lab 12/27/20 0640  12/27/20 0847 12/29/20 0332 12/30/20 0353  WBC 8.8  --  9.0 8.5  RBC 4.11  --  3.30* 3.04*  HGB 11.4* 12.9 9.0* 8.3*  HCT 35.9* 38.0 27.6* 25.9*  MCV 87.3  --  83.6 85.2  MCH 27.7  --  27.3 27.3  MCHC 31.8  --  32.6 32.0  RDW 14.4  --  14.4 14.4  PLT 369  --  296 316   BNP Recent Labs  Lab 12/27/20 0835  BNP 1,102.1*    DDimer No results for input(s): DDIMER in the last 168 hours.   Radiology  US RENAL  Result Date: 12/29/2020 CLINICAL DATA:  Hematuria. EXAM: RENAL / URINARY TRACT ULTRASOUND COMPLETE COMPARISON:  November 27, 2020. FINDINGS: Right Kidney: Renal measurements: 10.7 x 5.4 x 5.0 cm = volume: 150 mL. Echogenicity within normal limits. No mass or hydronephrosis visualized. Left Kidney: Renal measurements: 10.3 x 4.9 x 4.5 cm = volume: 112 mL. Echogenicity within normal limits. No mass visualized. Mild left hydronephrosis is noted. Bladder: Appears normal for degree of bladder distention. Ureteral jets were not visualized. Other: None. IMPRESSION: Mild left hydronephrosis. CT urogram may be performed to evaluate for possible cause of obstruction. Electronically Signed   By: Marijo Conception M.D.   On: 12/29/2020 10:23   CT RENAL STONE STUDY  Result Date: 12/29/2020 CLINICAL DATA:  Hydronephrosis. EXAM: CT ABDOMEN AND PELVIS WITHOUT CONTRAST TECHNIQUE: Multidetector CT imaging of the abdomen and pelvis was performed following the standard protocol without IV contrast. COMPARISON:  Ultrasound renal 12/29/2020. CT abdomen and pelvis 06/21/2015. FINDINGS: Lower chest: There are patchy ground-glass and atelectatic changes in both lung bases, right greater than left. There also airspace opacities in the right lower lobe. Heart is enlarged. Hepatobiliary: No focal liver abnormality is seen. Status post cholecystectomy. No biliary dilatation. Pancreas: Unremarkable. No pancreatic ductal dilatation or surrounding inflammatory changes. Spleen: Normal in size without focal abnormality.  Adrenals/Urinary Tract: There is mild left-sided hydroureteronephrosis. There is a 6 mm calculus in the distal left ureter best seen on coronal image 6/53. There is a single punctate left renal calculus. No bladder calculi are visualized. The right kidney and adrenal glands are within normal limits. Stomach/Bowel: Stomach is within normal limits. Appendix is not visualized. No dilated bowel loops are seen. No bowel obstruction. There is mild inflammatory stranding surrounding the gastric antrum and duodenum. Vascular/Lymphatic: Aortic atherosclerosis. No enlarged abdominal or pelvic  lymph nodes. Reproductive: Uterus and bilateral adnexa are unremarkable. Other: There is a small amount of free fluid in the right lower quadrant. There is no free air. There is mild diffuse body wall edema. There is no focal body wall hernia. Musculoskeletal: No fracture is seen. IMPRESSION: 1. 6 mm calculus in the distal left ureter with mild obstructive uropathy. 2. Additional nonobstructing left renal calculus. 3. Inflammatory stranding surrounding the gastric antrum and duodenum may be related to gastritis/duodenitis. Ulcer disease is a consideration. 4. Small amount of free fluid in the right lower quadrant. 5. Gro patchy ground-glass and airspace opacities in the lung bases, right greater than left, worrisome for infection and or edema. 6.  Aortic Atherosclerosis (ICD10-I70.0). Und-glass and Electronically Signed   By: Ronney Asters M.D.   On: 12/29/2020 18:40   DG HIP UNILAT WITH PELVIS 2-3 VIEWS LEFT  Result Date: 12/29/2020 CLINICAL DATA:  Status post fall EXAM: DG HIP (WITH OR WITHOUT PELVIS) 2-3V LEFT COMPARISON:  CT renal 12/29/2020 FINDINGS: There is no evidence of hip fracture or dislocation. There is no evidence of arthropathy or other focal bone abnormality. IMPRESSION: No acute displaced fracture or dislocation with better evaluation of the left hip on CT renal 12/29/2020. Electronically Signed   By: Iven Finn  M.D.   On: 12/29/2020 20:21   ECHOCARDIOGRAM LIMITED  Result Date: 12/28/2020    ECHOCARDIOGRAM LIMITED REPORT   Patient Name:   MARLON OVER Date of Exam: 12/28/2020 Medical Rec #:  [id number]    Height:       62.0 in Accession #:    [id number]   Weight:       145.7 lb Date of Birth:  1950-06-02    BSA:          1.671 m Patient Age:    14 years     BP:           125/93 mmHg Patient Gender: F            HR:           113 bpm. Exam Location:  Inpatient Procedure: Limited Echo, Limited Color Doppler and Cardiac Doppler Indications:    R07.9* Chest pain, unspecified. Elevated Troponin  History:        Patient has prior history of Echocardiogram examinations, most                 recent 11/27/2020. Risk Factors:Hypertension, Dyslipidemia and                 Diabetes.  Sonographer:    Bernadene Person RDCS Referring Phys: [id number] [place]  1. Compared with the echo [id number], systolic function is reduced. Left ventricular ejection fraction, by estimation, is 40 to 45%. The left ventricle has mildly decreased function. The left ventricle demonstrates global hypokinesis. Left ventricular diastolic parameters are indeterminate.  2. Right ventricular systolic function is normal. The right ventricular size is normal. There is moderately elevated pulmonary artery systolic pressure.  3. The pericardial effusion is circumferential.  4. The mitral valve is normal in structure. Mild mitral valve regurgitation. No evidence of mitral stenosis.  5. The aortic valve is tricuspid. There is mild calcification of the aortic valve. There is mild thickening of the aortic valve. Aortic valve regurgitation is not visualized. No aortic stenosis is present.  6. The inferior vena cava is dilated in size with <50% respiratory variability, suggesting right atrial pressure of 15 mmHg. FINDINGS  Left Ventricle: Compared  with the echo [id number], systolic function is reduced. Left ventricular ejection fraction, by estimation, is 40 to  45%. The left ventricle has mildly decreased function. The left ventricle demonstrates global hypokinesis. The left ventricular internal cavity size was normal in size. There is no left ventricular hypertrophy. Left ventricular diastolic parameters are indeterminate. Right Ventricle: The right ventricular size is normal. No increase in right ventricular wall thickness. Right ventricular systolic function is normal. There is moderately elevated pulmonary artery systolic pressure. The tricuspid regurgitant velocity is 2.87 m/s, and with an assumed right atrial pressure of 15 mmHg, the estimated right ventricular systolic pressure is [id number] mmHg. Left Atrium: Left atrial size was normal in size. Right Atrium: Right atrial size was normal in size. Pericardium: Trivial pericardial effusion is present. The pericardial effusion is circumferential. Mitral Valve: The mitral valve is normal in structure. Mild mitral annular calcification. Mild mitral valve regurgitation. No evidence of mitral valve stenosis. Tricuspid Valve: The tricuspid valve is normal in structure. Tricuspid valve regurgitation is trivial. No evidence of tricuspid stenosis. Aortic Valve: The aortic valve is tricuspid. There is mild calcification of the aortic valve. There is mild thickening of the aortic valve. Aortic valve regurgitation is not visualized. No aortic stenosis is present. Pulmonic Valve: The pulmonic valve was normal in structure. Pulmonic valve regurgitation is trivial. No evidence of pulmonic stenosis. Aorta: The aortic root is normal in size and structure. Venous: The inferior vena cava is dilated in size with less than 50% respiratory variability, suggesting right atrial pressure of 15 mmHg. IAS/Shunts: No atrial level shunt detected by color flow Doppler. LEFT VENTRICLE PLAX 2D LVIDd:         4.40 cm LVIDs:         2.05 cm LV PW:         1.00 cm LV IVS:        1.00 cm LVOT diam:     2.10 cm LV SV:         46 LV SV Index:   27 LVOT Area:      3.46 cm  LV Volumes (MOD) LV vol d, MOD A2C: 108.0 ml LV vol d, MOD A4C: 80.2 ml LV vol s, MOD A2C: 66.1 ml LV vol s, MOD A4C: 47.1 ml LV SV MOD A2C:     41.9 ml LV SV MOD A4C:     80.2 ml LV SV MOD BP:      37.5 ml RIGHT VENTRICLE TAPSE (M-mode): 1.1 cm LEFT ATRIUM         Index LA diam:    4.30 cm 2.57 cm/m  AORTIC VALVE LVOT Vmax:   87.37 cm/s LVOT Vmean:  63.833 cm/s LVOT VTI:    0.133 m  AORTA Ao Root diam: 3.20 cm MR Peak grad:    90.2 mmHg   TRICUSPID VALVE MR Mean grad:    57.0 mmHg   TR Peak grad:   32.9 mmHg MR Vmax:         475.00 cm/s TR Vmax:        287.00 cm/s MR Vmean:        357.0 cm/s MR PISA:         1.01 cm    SHUNTS MR PISA Eff ROA: 8 mm       Systemic VTI:  0.13 m MR PISA Radius:  0.40 cm     Systemic Diam: 2.10 cm Skeet Latch MD Electronically signed by Skeet Latch MD Signature Date/Time: 12/28/2020/1:40:55 PM  Final     Cardiac Studies  TTE 12/28/2020  1. Compared with the echo A999333, systolic function is reduced. Left  ventricular ejection fraction, by estimation, is 40 to 45%. The left  ventricle has mildly decreased function. The left ventricle demonstrates  global hypokinesis. Left ventricular  diastolic parameters are indeterminate.   2. Right ventricular systolic function is normal. The right ventricular  size is normal. There is moderately elevated pulmonary artery systolic  pressure.   3. The pericardial effusion is circumferential.   4. The mitral valve is normal in structure. Mild mitral valve  regurgitation. No evidence of mitral stenosis.   5. The aortic valve is tricuspid. There is mild calcification of the  aortic valve. There is mild thickening of the aortic valve. Aortic valve  regurgitation is not visualized. No aortic stenosis is present.   6. The inferior vena cava is dilated in size with <50% respiratory  variability, suggesting right atrial pressure of 15 mmHg.   Patient Profile  70 year old female with history of CKD stage IV,  diastolic heart failure, PAD (bilateral SFA CTO's), left renal artery stenosis (90%), diabetes, CAD who was admitted on 12/27/2020 with chest pain and acute hypoxic respiratory failure secondary to pulmonary edema from likely hypertensive crisis.  Course complicated by A. fib with RVR 12/28/2020.   Assessment & Plan   #A. fib with RVR -Developed on 12/28/2020.  No prior history of this.  Had difficult to control rates.  Was started on amiodarone he converted back to sinus rhythm.  She was in A. fib for less than 48 hours.  She was anticoagulated however this was stopped due to nosebleed.  Hemoglobin continues to decline.  Would not recommend to restart anticoagulation.  There is mention of gastritis on CT scan.  She may need GI evaluation. -Continue with oral amiodarone.  400 mg twice daily for 7 days and then 200 mg daily for 21 days.  Do not anticipate this to be a long standing therapy for her.  Suspect this will just get her through her critical illness. -Given continued decline hemoglobin would discuss her case with GI.  #Acute blood loss anemia #Nosebleed #Gastritis -Developed nosebleed while on heparin.  Hemoglobin continues to decline.  We have continued to hold her heparin today. -There is mention of gastritis on her CT scan.  Possibly GI source.  Would not be a bad idea to get GI to evaluate her.  She will have a need for anticoagulation moving forward and we need to know if it is safe.  #Acute hypoxic respiratory failure #CKD stage IV #Flash pulmonary edema, recurrent #Systolic heart failure EF 40 to 45% #Volume overload/moderate pulm hypertension #Left renal artery stenosis -Admitted with decompensated heart failure in the setting of flash pulmonary edema.  She had a very elevated spike in her blood pressure that led to pulmonary edema. -Concerns for 80% stenosis in the proximal left renal artery.  Possibly this is contributing. -Has suffered profound AKI now with obstructive  uropathy.  She will undergo ureteral stent placement today. -Her sodium continues to decline.  This is indicative of volume overload.  Hopefully urine output will pick up with ureteral stone. -Ejection fraction has declined this admission in the setting of A. fib with RVR.  Possibly arrhythmia related. -Remains volume overloaded.  Hopefully this will improve with ureteral stent placement.  For now we can do is continue with metoprolol succinate 50 mg daily, hydralazine 25 mg 3 times daily, Imdur 30 mg daily.  She is not a candidate for ACE/ARB/Arni/MRA given CKD stage IV.  Ischemia evaluation is not recommended either. -We ultimately did have plans to pursue stenting of her left renal artery.  There is some contrast load involved.  We do not want a risk to her kidneys.  We will continue to follow along for this.  I suspect this will be done as an outpatient.  More pressing issue is obstructive uropathy and hopefully urine output and kidney failure will improve.  #Elevated troponin #Demand ischemia #Coronary calcifications on prior chest CT -Holding aspirin in setting of blood loss.  Continue statin.  Suspect this is all demand.  She does have coronary calcifications.  Would ultimately benefit from ischemic evaluation however given renal failure this is not the time.  For questions or updates, please contact [place] Please consult [email or web address] for contact info under   Time Spent with Patient: I have spent a total of 35 minutes with patient reviewing hospital notes, telemetry, EKGs, labs and examining the patient as well as establishing an assessment and plan that was discussed with the patient.  > 50% of time was spent in direct patient care.    Signed, Addison Naegeli. Audie Box, MD, [place]  12/30/2020 9:49 AM

## 2020-12-30 NOTE — Progress Notes (Signed)
PROGRESS NOTE    Molly [name]  [id number] DOB: 20-Sep-1950 DOA: 12/27/2020 PCP: [name], MD   Brief Narrative:  70 year old female with past medical history of hypertension, left renal artery stenosis (A999333) diastolic CHF, CKD stage IV, CAD, PAD with bilateral SFA CTO's, anemia chronic disease diabetes presented on 12/27/2020 with 4 days of chest pain.  BP was uncontrolled and spiked as high as 234/208 in the ED.  Chest x-ray consistent with pulmonary edema.  Patient briefly required BiPAP.   Admitted to [facility] service with cardiology consulted. Patient developed A. fib with RVR on 9/13.  Assessment & Plan:   Principal Problem:   Acute on chronic respiratory failure with hypoxia and hypercapnia (HCC) Active Problems:   HTN (hypertension)   Type 2 diabetes mellitus with stage 3b chronic kidney disease, with long-term current use of insulin (HCC)   Elevated troponin   Acute on chronic heart failure with preserved ejection fraction (HFpEF) ([place])   CAP (community acquired pneumonia)   Anemia of chronic disease   Acute respiratory failure with hypoxia secondary to recurrent flash pulmonary edema/acute systolic congestive heart failure: Echo shows reduced ejection fraction to 40 to 45% compared to the previous 1.  Cardiology on board and managing.  Due to rising creatinine, she is on diuretics holiday.  No plans for diuretics per cardiology.    Fall in hospital/left hip pain: Patient had a fall in the hospital on the evening of 12/29/2020.  Continues to complain of left hip pain.  X-ray left hip negative for fracture.  She is having hard time lifting her left lower extremity.  We will proceed with CT left hip to rule out fracture.  Hypertensive emergency with chest pain: No chest pain and blood pressure controlled now.  Continue amlodipine 10 mg p.o. daily, hydralazine 25 mg p.o. 3 times daily Imdur to 30 mg p.o. daily, Toprol-XL 50 mg p.o. daily.  Left renal artery stenosis:  Has a history of 80 to 90% of proximal left renal artery stenosis.  Plan was to do angiogram and stent but due to rising creatinine, this plan is on hold for now.  Cardiology and nephrology managing.  New onset A. fib with RVR: Developed overnight 12/28/2020.  She was initially started on amiodarone drip and converted back to sinus rhythm.  Transitioned to amiodarone 400 mg p.o. twice daily for 7 days followed by 200 mg daily for 21 days.  Per cardiology, it is not going to be long-term but instead only for 4 weeks.  Patient's heparin was stopped this morning due to epistaxis and drop in hemoglobin.  Epistaxis: Nosebleed overnight 12/30/2018, no further episodes.  Heparin was stopped and has remained on hold since then.  CKD stage IV/progressive renal failure/obstructive uropathy/left ureteral stone/left hydronephrosis: CT renal stone yesterday shows 6 mm of left ureteral stone causing obstruction/hydronephrosis.  Perhaps this is the cause of her rising creatinine.  I have consulted urology and she is scheduled to get ureteral stent later today.  Continues to have low urine output likely secondary to that as well.  Creatinine jumped to 4.92 today.  Hyponatremia: 127, likely due to hemodilution.  Hopefully this will improve with fixing the main issue of urinary obstruction.  Community-acquired pneumonia: Continue Rocephin for total of 5-7 days.  Elevated troponin/demand ischemia: In the setting of hypertensive crisis.  EKG without ischemic changes.  Likely demand ischemia. 397 > 376.  Cardiology on board.  No plan for intervention.  Type 2 diabetes mellitus: Recent hemoglobin A1c  8.2% on 11/27/2020.  Home regimen includes Humulin 70/30 mix 8 to 12 units daily.  Currently on 8 units here and SSI.  Blood sugar controlled.  Continue current regimen.  Dyslipidemia: Continue Zetia and Crestor.  PAD: Continue statin and Pletal.  Anemia of chronic disease: Slight drop in hemoglobin again today, was 12.93  days ago, 9.0 yesterday and 8.3 today.  CT renal stone indicates possible gastritis or gastric ulcer.  Per their note, it appears that cardiology is going to discuss with the GI.  She is on famotidine.  We will add Protonix 40 mg IV daily.  GERD: Continue PPI.  DVT prophylaxis:    Code Status: Full Code  Family Communication: none present at bedside.  Plan of care discussed with patient in length and he verbalized understanding and agreed with it.  Status is: Inpatient  Remains inpatient appropriate because:Ongoing diagnostic testing needed not appropriate for outpatient work up  Dispo: The patient is from: Home              Anticipated d/c is to: Home              Patient currently is not medically stable to d/c.   Difficult to place patient No        Estimated body mass index is 27.02 kg/m as calculated from the following:   Height as of this encounter: '5\' 2"'$  (1.575 m).   Weight as of this encounter: 67 kg.     Nutritional Assessment: Body mass index is 27.02 kg/m.Marland Kitchen Seen by dietician.  I agree with the assessment and plan as outlined below: Nutrition Status:       Skin Assessment: I have examined the patient's skin and I agree with the wound assessment as performed by the wound care RN as outlined below:    Consultants:  Nephrology Cardiology  Procedures:  None  Antimicrobials:  Anti-infectives (From admission, onward)    Start     Dose/Rate Route Frequency Ordered Stop   12/27/20 1530  cefTRIAXone (ROCEPHIN) 2 g in sodium chloride 0.9 % 100 mL IVPB        2 g 200 mL/hr over 30 Minutes Intravenous Every 24 hours 12/27/20 1435            Subjective: Patient seen and examined.  She was slightly lethargic, likely due to pain.  Was complaining of left hip pain.  No other complaint.  No shortness of breath.  She fell yesterday.  Objective: Vitals:   12/29/20 2337 12/30/20 0223 12/30/20 0558 12/30/20 0809  BP: 103/71  (!) 134/57 (!) 111/55  Pulse: 78   82 81  Resp: 20  (!) 22   Temp: 97.6 F (36.4 C)  98.7 F (37.1 C) (!) 97.5 F (36.4 C)  TempSrc: Oral  Oral Oral  SpO2: 91%  92% 93%  Weight:  67 kg    Height:        Intake/Output Summary (Last 24 hours) at 12/30/2020 1053 Last data filed at 12/30/2020 1000 Gross per 24 hour  Intake 343 ml  Output 75 ml  Net 268 ml    Filed Weights   12/28/20 0040 12/29/20 0504 12/30/20 0223  Weight: 66.1 kg 67 kg 67 kg    Examination:  General exam: Appears slightly lethargic today. Respiratory system: Bibasilar crackles. Respiratory effort normal. Cardiovascular system: S1 & S2 heard, RRR. No JVD, murmurs, rubs, gallops or clicks. No pedal edema. Gastrointestinal system: Abdomen is nondistended, soft and nontender. No organomegaly or masses  felt. Normal bowel sounds heard. Central nervous system: Alert and oriented. No focal neurological deficits. Skin: No rashes, lesions or ulcers.  Psychiatry: Judgement and insight appear normal. Mood & affect appropriate.    Data Reviewed: I have personally reviewed following labs and imaging studies  CBC: Recent Labs  Lab 12/27/20 0640 12/27/20 0847 12/29/20 0332 12/30/20 0353  WBC 8.8  --  9.0 8.5  HGB 11.4* 12.9 9.0* 8.3*  HCT 35.9* 38.0 27.6* 25.9*  MCV 87.3  --  83.6 85.2  PLT 369  --  296 [id number]    Basic Metabolic Panel: Recent Labs  Lab 12/27/20 0640 12/27/20 0847 12/28/20 0336 12/29/20 0332 12/30/20 0353  NA 131* 130* 129* 126* 127*  K 3.8 3.8 3.5 3.7 3.9  CL 91*  --  87* 84* 85*  CO2 26  --  '31 26 25  '$ GLUCOSE 166*  --  177* 160* 118*  BUN 36*  --  44* 49* 58*  CREATININE 3.53*  --  3.79* 4.28* 4.92*  CALCIUM 9.7  --  8.7* 8.2* 8.0*  MG  --   --   --  2.1  --     GFR: Estimated Creatinine Clearance: 9.8 mL/min (A) (by C-G formula based on SCr of 4.92 mg/dL (H)). Liver Function Tests: No results for input(s): AST, ALT, ALKPHOS, BILITOT, PROT, ALBUMIN in the last 168 hours. No results for input(s): LIPASE, AMYLASE in  the last 168 hours. No results for input(s): AMMONIA in the last 168 hours. Coagulation Profile: No results for input(s): INR, PROTIME in the last 168 hours. Cardiac Enzymes: No results for input(s): CKTOTAL, CKMB, CKMBINDEX, TROPONINI in the last 168 hours. BNP (last 3 results) No results for input(s): PROBNP in the last 8760 hours. HbA1C: No results for input(s): HGBA1C in the last 72 hours. CBG: Recent Labs  Lab 12/29/20 1633 12/29/20 1912 12/29/20 2139 12/30/20 0604 12/30/20 0808  GLUCAP 83 90 89 112* 117*    Lipid Profile: No results for input(s): CHOL, HDL, LDLCALC, TRIG, CHOLHDL, LDLDIRECT in the last 72 hours. Thyroid Function Tests: No results for input(s): TSH, T4TOTAL, FREET4, T3FREE, THYROIDAB in the last 72 hours. Anemia Panel: No results for input(s): VITAMINB12, FOLATE, FERRITIN, TIBC, IRON, RETICCTPCT in the last 72 hours. Sepsis Labs: Recent Labs  Lab 12/27/20 1130  PROCALCITON 0.39     Recent Results (from the past 240 hour(s))  Resp Panel by RT-PCR (Flu A&B, Covid) Nasopharyngeal Swab     Status: None   Collection Time: 12/27/20  8:53 AM   Specimen: Nasopharyngeal Swab; Nasopharyngeal(NP) swabs in vial transport medium  Result Value Ref Range Status   SARS Coronavirus 2 by RT PCR NEGATIVE NEGATIVE Final    Comment: (NOTE) SARS-CoV-2 target nucleic acids are NOT DETECTED.  The SARS-CoV-2 RNA is generally detectable in upper respiratory specimens during the acute phase of infection. The lowest concentration of SARS-CoV-2 viral copies this assay can detect is 138 copies/mL. A negative result does not preclude SARS-Cov-2 infection and should not be used as the sole basis for treatment or other patient management decisions. A negative result may occur with  improper specimen collection/handling, submission of specimen other than nasopharyngeal swab, presence of viral mutation(s) within the areas targeted by this assay, and inadequate number of  viral copies(<138 copies/mL). A negative result must be combined with clinical observations, patient history, and epidemiological information. The expected result is Negative.  Fact Sheet for Patients:  EntrepreneurPulse.com.au  Fact Sheet for Healthcare Providers:  IncredibleEmployment.be  This test is no t yet approved or cleared by the [place] and  has been authorized for detection and/or diagnosis of SARS-CoV-2 by FDA under an Emergency Use Authorization (EUA). This EUA will remain  in effect (meaning this test can be used) for the duration of the COVID-19 declaration under Section 564(b)(1) of the Act, 21 U.S.C.section 360bbb-3(b)(1), unless the authorization is terminated  or revoked sooner.       Influenza A by PCR NEGATIVE NEGATIVE Final   Influenza B by PCR NEGATIVE NEGATIVE Final    Comment: (NOTE) The Xpert Xpress SARS-CoV-2/FLU/RSV plus assay is intended as an aid in the diagnosis of influenza from Nasopharyngeal swab specimens and should not be used as a sole basis for treatment. Nasal washings and aspirates are unacceptable for Xpert Xpress SARS-CoV-2/FLU/RSV testing.  Fact Sheet for Patients: EntrepreneurPulse.com.au  Fact Sheet for Healthcare Providers: IncredibleEmployment.be  This test is not yet approved or cleared by the [place] FDA and has been authorized for detection and/or diagnosis of SARS-CoV-2 by FDA under an Emergency Use Authorization (EUA). This EUA will remain in effect (meaning this test can be used) for the duration of the COVID-19 declaration under Section 564(b)(1) of the Act, 21 U.S.C. section 360bbb-3(b)(1), unless the authorization is terminated or revoked.  Performed at [facility], [place] [address] [place], [place] [address]        Radiology Studies: US RENAL  Result Date: 12/29/2020 CLINICAL DATA:  Hematuria. EXAM: RENAL / URINARY  TRACT ULTRASOUND COMPLETE COMPARISON:  November 27, 2020. FINDINGS: Right Kidney: Renal measurements: 10.7 x 5.4 x 5.0 cm = volume: 150 mL. Echogenicity within normal limits. No mass or hydronephrosis visualized. Left Kidney: Renal measurements: 10.3 x 4.9 x 4.5 cm = volume: 112 mL. Echogenicity within normal limits. No mass visualized. Mild left hydronephrosis is noted. Bladder: Appears normal for degree of bladder distention. Ureteral jets were not visualized. Other: None. IMPRESSION: Mild left hydronephrosis. CT urogram may be performed to evaluate for possible cause of obstruction. Electronically Signed   By: Marijo Conception M.D.   On: 12/29/2020 10:23   CT RENAL STONE STUDY  Result Date: 12/29/2020 CLINICAL DATA:  Hydronephrosis. EXAM: CT ABDOMEN AND PELVIS WITHOUT CONTRAST TECHNIQUE: Multidetector CT imaging of the abdomen and pelvis was performed following the standard protocol without IV contrast. COMPARISON:  Ultrasound renal 12/29/2020. CT abdomen and pelvis 06/21/2015. FINDINGS: Lower chest: There are patchy ground-glass and atelectatic changes in both lung bases, right greater than left. There also airspace opacities in the right lower lobe. Heart is enlarged. Hepatobiliary: No focal liver abnormality is seen. Status post cholecystectomy. No biliary dilatation. Pancreas: Unremarkable. No pancreatic ductal dilatation or surrounding inflammatory changes. Spleen: Normal in size without focal abnormality. Adrenals/Urinary Tract: There is mild left-sided hydroureteronephrosis. There is a 6 mm calculus in the distal left ureter best seen on coronal image 6/53. There is a single punctate left renal calculus. No bladder calculi are visualized. The right kidney and adrenal glands are within normal limits. Stomach/Bowel: Stomach is within normal limits. Appendix is not visualized. No dilated bowel loops are seen. No bowel obstruction. There is mild inflammatory stranding surrounding the gastric antrum and  duodenum. Vascular/Lymphatic: Aortic atherosclerosis. No enlarged abdominal or pelvic lymph nodes. Reproductive: Uterus and bilateral adnexa are unremarkable. Other: There is a small amount of free fluid in the right lower quadrant. There is no free air. There is mild diffuse body wall edema. There is no focal body wall hernia.  Musculoskeletal: No fracture is seen. IMPRESSION: 1. 6 mm calculus in the distal left ureter with mild obstructive uropathy. 2. Additional nonobstructing left renal calculus. 3. Inflammatory stranding surrounding the gastric antrum and duodenum may be related to gastritis/duodenitis. Ulcer disease is a consideration. 4. Small amount of free fluid in the right lower quadrant. 5. Gro patchy ground-glass and airspace opacities in the lung bases, right greater than left, worrisome for infection and or edema. 6.  Aortic Atherosclerosis (ICD10-I70.0). Und-glass and Electronically Signed   By: Ronney Asters M.D.   On: 12/29/2020 18:40   DG HIP UNILAT WITH PELVIS 2-3 VIEWS LEFT  Result Date: 12/29/2020 CLINICAL DATA:  Status post fall EXAM: DG HIP (WITH OR WITHOUT PELVIS) 2-3V LEFT COMPARISON:  CT renal 12/29/2020 FINDINGS: There is no evidence of hip fracture or dislocation. There is no evidence of arthropathy or other focal bone abnormality. IMPRESSION: No acute displaced fracture or dislocation with better evaluation of the left hip on CT renal 12/29/2020. Electronically Signed   By: Iven Finn M.D.   On: 12/29/2020 20:21   ECHOCARDIOGRAM LIMITED  Result Date: 12/28/2020    ECHOCARDIOGRAM LIMITED REPORT   Patient Name:   Molly [name] Date of Exam: 12/28/2020 Medical Rec #:  [id number]    Height:       62.0 in Accession #:    [id number]   Weight:       145.7 lb Date of Birth:  05/15/1950    BSA:          1.671 m Patient Age:    69 years     BP:           125/93 mmHg Patient Gender: F            HR:           113 bpm. Exam Location:  Inpatient Procedure: Limited Echo, Limited Color  Doppler and Cardiac Doppler Indications:    R07.9* Chest pain, unspecified. Elevated Troponin  History:        Patient has prior history of Echocardiogram examinations, most                 recent 11/27/2020. Risk Factors:Hypertension, Dyslipidemia and                 Diabetes.  Sonographer:    Bernadene Person RDCS Referring Phys: [id number] [place]  1. Compared with the echo A999333, systolic function is reduced. Left ventricular ejection fraction, by estimation, is 40 to 45%. The left ventricle has mildly decreased function. The left ventricle demonstrates global hypokinesis. Left ventricular diastolic parameters are indeterminate.  2. Right ventricular systolic function is normal. The right ventricular size is normal. There is moderately elevated pulmonary artery systolic pressure.  3. The pericardial effusion is circumferential.  4. The mitral valve is normal in structure. Mild mitral valve regurgitation. No evidence of mitral stenosis.  5. The aortic valve is tricuspid. There is mild calcification of the aortic valve. There is mild thickening of the aortic valve. Aortic valve regurgitation is not visualized. No aortic stenosis is present.  6. The inferior vena cava is dilated in size with <50% respiratory variability, suggesting right atrial pressure of 15 mmHg. FINDINGS  Left Ventricle: Compared with the echo A999333, systolic function is reduced. Left ventricular ejection fraction, by estimation, is 40 to 45%. The left ventricle has mildly decreased function. The left ventricle demonstrates global hypokinesis. The left ventricular internal cavity size was normal in size. There  is no left ventricular hypertrophy. Left ventricular diastolic parameters are indeterminate. Right Ventricle: The right ventricular size is normal. No increase in right ventricular wall thickness. Right ventricular systolic function is normal. There is moderately elevated pulmonary artery systolic pressure. The tricuspid  regurgitant velocity is 2.87 m/s, and with an assumed right atrial pressure of 15 mmHg, the estimated right ventricular systolic pressure is [id number] mmHg. Left Atrium: Left atrial size was normal in size. Right Atrium: Right atrial size was normal in size. Pericardium: Trivial pericardial effusion is present. The pericardial effusion is circumferential. Mitral Valve: The mitral valve is normal in structure. Mild mitral annular calcification. Mild mitral valve regurgitation. No evidence of mitral valve stenosis. Tricuspid Valve: The tricuspid valve is normal in structure. Tricuspid valve regurgitation is trivial. No evidence of tricuspid stenosis. Aortic Valve: The aortic valve is tricuspid. There is mild calcification of the aortic valve. There is mild thickening of the aortic valve. Aortic valve regurgitation is not visualized. No aortic stenosis is present. Pulmonic Valve: The pulmonic valve was normal in structure. Pulmonic valve regurgitation is trivial. No evidence of pulmonic stenosis. Aorta: The aortic root is normal in size and structure. Venous: The inferior vena cava is dilated in size with less than 50% respiratory variability, suggesting right atrial pressure of 15 mmHg. IAS/Shunts: No atrial level shunt detected by color flow Doppler. LEFT VENTRICLE PLAX 2D LVIDd:         4.40 cm LVIDs:         2.05 cm LV PW:         1.00 cm LV IVS:        1.00 cm LVOT diam:     2.10 cm LV SV:         46 LV SV Index:   27 LVOT Area:     3.46 cm  LV Volumes (MOD) LV vol d, MOD A2C: 108.0 ml LV vol d, MOD A4C: 80.2 ml LV vol s, MOD A2C: 66.1 ml LV vol s, MOD A4C: 47.1 ml LV SV MOD A2C:     41.9 ml LV SV MOD A4C:     80.2 ml LV SV MOD BP:      37.5 ml RIGHT VENTRICLE TAPSE (M-mode): 1.1 cm LEFT ATRIUM         Index LA diam:    4.30 cm 2.57 cm/m  AORTIC VALVE LVOT Vmax:   87.37 cm/s LVOT Vmean:  63.833 cm/s LVOT VTI:    0.133 m  AORTA Ao Root diam: 3.20 cm MR Peak grad:    90.2 mmHg   TRICUSPID VALVE MR Mean grad:    57.0  mmHg   TR Peak grad:   32.9 mmHg MR Vmax:         475.00 cm/s TR Vmax:        287.00 cm/s MR Vmean:        357.0 cm/s MR PISA:         1.01 cm    SHUNTS MR PISA Eff ROA: 8 mm       Systemic VTI:  0.13 m MR PISA Radius:  0.40 cm     Systemic Diam: 2.10 cm Skeet Latch MD Electronically signed by Skeet Latch MD Signature Date/Time: 12/28/2020/1:40:55 PM    Final     Scheduled Meds:  amiodarone  400 mg Oral BID   Followed by   Derrill Memo ON 01/05/2021] amiodarone  200 mg Oral Daily   amLODipine  10 mg Oral Daily   cilostazol  50 mg Oral BID   ezetimibe  10 mg Oral Daily   famotidine  20 mg Oral Daily   hydrALAZINE  25 mg Oral Q8H   insulin aspart  0-6 Units Subcutaneous TID WC   insulin aspart protamine- aspart  8 Units Subcutaneous Q breakfast   isosorbide mononitrate  30 mg Oral Daily   metoprolol succinate  50 mg Oral Daily   rosuvastatin  40 mg Oral Daily   sodium chloride flush  3 mL Intravenous Q12H   Continuous Infusions:  sodium chloride     cefTRIAXone (ROCEPHIN)  IV 2 g (12/30/20 0918)     LOS: 3 days   Time spent: 31 minutes   Darliss Cheney, MD [facility]  12/30/2020, 10:53 AM  Please page via Shea [facility] and do not message via secure chat for anything urgent. Secure chat can be used for anything non urgent and I will respond at my earliest availability.  How to contact the [facility] Attending or Consulting provider [place] or covering provider during after hours [place], for this patient?  Check the care team in [facility] and look for a) attending/consulting [facility] provider listed and b) the [facility] team listed. Page or secure chat [phone]. Log into [email or web address] and use [place]'s universal password to access. If you do not have the password, please contact the hospital operator. Locate the [facility] provider you are looking for under [facility] and page to a number that you can be directly reached. If you still have difficulty reaching the provider, please page the [facility] (Director  on Call) for the Hospitalists listed on amion for assistance.

## 2020-12-30 NOTE — Consult Note (Signed)
Urology Consult   Physician requesting consult: Darliss Cheney, MD  Reason for consult: Left ureteral stone  History of Present Illness: Molly [name] is a 70 y.o. seen in consultation for obstructing left ureteral stone.  .  She has a past medical history of hypertension, left renal artery stenosis up to 90%, congestive heart failure, CKD stage III/IV, CAD, PAD and diabetes who initially presented to the ED with chest pain, acute hypoxemic respiratory failure and hypertensive emergency.  She was found to have AKI on chronic kidney disease.  A CT A/P was ordered to assess for her renal artery stenosis.  It incidentally discovered a 6 mm stone in the distal left ureter with mild obstructive uropathy.  She also had additional nonobstructive left renal stone which is punctate.  There are no right ureteral stones or renal stones identified.  She does endorse some intermittent left-sided flank pain.  Her main complaint is hip pain and left elbow pain after suffering a fall yesterday.  She denies a history of urolithiasis.  Past Medical History:  Diagnosis Date   Abdominal aortic atherosclerosis with stenosis 06/29/2015   AKI (acute kidney injury) ([place]) 06/29/2015   Atherosclerosis of coronary artery without angina pectoris 04/21/2020   Atopic dermatitis 04/21/2020   Benign hypertension with CKD (chronic kidney disease) stage III ([place]) 04/21/2020   Bilateral impacted cerumen 08/21/2019   Chest pain 08/31/2020   Cholecystitis 06/29/2015   Cholelithiasis 06/29/2015   Claudication in peripheral vascular disease ([place]) 08/28/2019   Peripheral arterial disease   Colon cancer screening 04/21/2020   Dehydration with hyponatremia 06/29/2015   Diabetes mellitus ([place]) 07/15/2018   Diarrhea 04/21/2020   DKA (diabetic ketoacidoses) 06/29/2015   Dyslipidemia 08/30/2018   Elevated troponin    Epigastric pain 04/21/2020   Gastroesophageal reflux disease 04/21/2020   HTN (hypertension) 06/29/2015   Hyperglycemia due to type 2  diabetes mellitus ([place]) 04/21/2020   Hypertension    Hypertensive emergency 07/12/2020   Hypertensive heart disease without congestive heart failure 04/21/2020   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) 04/21/2020   Loss of appetite 04/21/2020   Nausea 04/21/2020   Occlusion and stenosis of bilateral carotid arteries 04/21/2020   Osteopenia 10/02/2018   Other dysphagia 09/10/2018   Peripheral vascular disease ([place]) 07/15/2018   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   Proptosis 04/21/2020   Proximal leg weakness 09/10/2018   Ptosis of left eyelid 09/10/2018   Renal artery stenosis ([place]) 12/17/2019   Renal artery stenosis   Standard chest x-ray abnormal 04/21/2020   Vitamin D deficiency 04/21/2020   Weight loss 04/21/2020    Past Surgical History:  Procedure Laterality Date   ABDOMINAL AORTOGRAM W/LOWER EXTREMITY N/A 08/28/2019   Procedure: ABDOMINAL AORTOGRAM W/LOWER EXTREMITY;  Surgeon: Lorretta Harp, MD;  Location: [facility];  Service: Cardiovascular;  Laterality: N/A;   CATARACT EXTRACTION, BILATERAL     CHOLECYSTECTOMY N/A 06/30/2015   Procedure: LAPAROSCOPIC CHOLECYSTECTOMY WITH INTRAOPERATIVE CHOLANGIOGRAM;  Surgeon: Donnie Mesa, MD;  Location: [facility];  Service: General;  Laterality: N/A;   PERIPHERAL VASCULAR BALLOON ANGIOPLASTY Right 08/28/2019   Procedure: PERIPHERAL VASCULAR BALLOON ANGIOPLASTY;  Surgeon: Lorretta Harp, MD;  Location: [facility];  Service: Cardiovascular;  Laterality:  Right;  attempted SFA     Current Hospital Medications:  Home meds:  No current facility-administered medications on file prior to encounter.   Current Outpatient Medications on File Prior to Encounter   Medication Sig Dispense Refill   amLODipine (NORVASC) 10 MG tablet Take 1 tablet (10 mg total) by mouth daily. 30 tablet 0   Cholecalciferol 25 MCG (1000 UT) tablet Take 2,000 Units by mouth daily.     cilostazol (PLETAL) 50 MG tablet Take 1 tablet (50 mg total) by mouth 2 (two) times daily. 180 tablet 3   coconut oil OIL Apply 1 application topically as needed (itching).     ezetimibe (ZETIA) 10 MG tablet Take 1 tablet (10 mg total) by mouth daily. 90 tablet 2   famotidine (PEPCID) 20 MG tablet Take 20 mg by mouth in the morning and at bedtime.     furosemide (LASIX) 40 MG tablet Take 1 tablet (40 mg total) by mouth daily. 30 tablet 0   hydrALAZINE (APRESOLINE) 25 MG tablet Take 1 tablet (25 mg total) by mouth every 12 (twelve) hours. 180 tablet 2   hydrocortisone cream 1 % Apply 1 application topically 4 (four) times daily as needed for itching.     insulin isophane & regular human KwikPen (HUMULIN 70/30 MIX) (70-30) 100 UNIT/ML KwikPen Inject 8-12 Units into the skin daily.     ketoconazole (NIZORAL) 2 % cream Apply 1 application topically daily as needed for irritation.      levocetirizine (XYZAL) 5 MG tablet Take 5 mg by mouth daily as needed for allergies.     loperamide (IMODIUM A-D) 2 MG tablet Take 2 mg by mouth 4 (four) times daily as needed for diarrhea or loose stools.     Pediatric Multivitamins-Iron (FLINTSTONES COMPLETE) 18 MG CHEW Chew 1 tablet by mouth daily. Unknown strength     rosuvastatin (CRESTOR) 40 MG tablet TAKE 1 TABLET(40 MG) BY MOUTH AT BEDTIME (Patient taking differently: Take 40 mg by mouth daily.) 90 tablet 1   triamcinolone cream (KENALOG) 0.1 % Apply 1 application topically daily as needed (spots on legs).        Scheduled Meds:  [MAR Hold] amiodarone  400 mg Oral BID   Followed by   [id number] Hold] amiodarone  200 mg Oral Daily   [MAR Hold] amLODipine  10 mg Oral Daily   [MAR Hold] cilostazol  50 mg Oral BID   [MAR Hold] ezetimibe  10 mg Oral Daily   [MAR  Hold] famotidine  20 mg Oral Daily   fentaNYL       [MAR Hold] hydrALAZINE  25 mg Oral Q8H   [MAR Hold] insulin aspart  0-6 Units Subcutaneous TID WC   [MAR Hold] insulin aspart protamine- aspart  8 Units Subcutaneous Q breakfast   [MAR Hold] isosorbide mononitrate  30 mg Oral Daily   [MAR Hold] metoprolol succinate  50 mg Oral Daily   [MAR Hold] pantoprazole (PROTONIX) IV  40 mg Intravenous Daily   [MAR Hold] rosuvastatin  40 mg Oral Daily   [MAR Hold] sodium chloride flush  3 mL Intravenous Q12H   Continuous Infusions:  [MAR Hold] sodium chloride     [MAR Hold] cefTRIAXone (ROCEPHIN)  IV     lactated ringers     PRN Meds:.[MAR Hold] sodium chloride, [MAR Hold] acetaminophen, [MAR Hold] hydrocortisone cream, [MAR Hold]  HYDROmorphone (DILAUDID) injection, [MAR Hold] loperamide, [MAR Hold] loratadine, [MAR Hold] sodium chloride, [MAR Hold] sodium chloride flush  Allergies:  Allergies  Allergen Reactions   Lisinopril Other (See Comments)   Mercury Nausea And Vomiting    Family History  Problem Relation Age of Onset   Breast cancer Mother    Hypertension Mother    Hypertension Father    Pancreatic cancer Other        uncle    Social History:  reports that she has never smoked. She has never used smokeless tobacco. She reports that she does not drink alcohol and does not use drugs.  ROS: A complete review of systems was performed.  All systems are negative except for pertinent findings as noted.  Physical Exam:  Vital signs in last 24 hours: Temp:  [97.5 F (36.4 C)-98.7 F (37.1 C)] 98.7 F (37.1 C) (09/15 1421) Pulse Rate:  [75-82] 76 (09/15 1421) Resp:  [16-22] 22 (09/15 0558) BP: (103-134)/(45-71) 124/50 (09/15 1421) SpO2:  [91 %-98 %] 98 % (09/15 1421) Weight:  [67 kg] 67 kg (09/15 0223) Constitutional:  Alert and oriented, No acute distress Cardiovascular: Regular rate and rhythm Respiratory: Normal respiratory effort, Lungs clear bilaterally GI: Abdomen is  soft, nontender, nondistended, no abdominal masses GU: No CVA tenderness Neurologic: Grossly intact, no focal deficits Psychiatric: Normal mood and affect  Laboratory Data:  Recent Labs    12/29/20 0332 12/30/20 0353  WBC 9.0 8.5  HGB 9.0* 8.3*  HCT 27.6* 25.9*  PLT 296 316    Recent Labs    12/28/20 0336 12/29/20 0332 12/30/20 0353  NA 129* 126* 127*  K 3.5 3.7 3.9  CL 87* 84* 85*  GLUCOSE 177* 160* 118*  BUN 44* 49* 58*  CALCIUM 8.7* 8.2* 8.0*  CREATININE 3.79* 4.28* 4.92*     Results for orders placed or performed during the hospital encounter of 12/27/20 (from the past 24 hour(s))  Glucose, capillary     Status: None   Collection Time: 12/29/20  4:33 PM  Result Value Ref Range   Glucose-Capillary 83 70 - 99 mg/dL  Glucose, capillary     Status: None   Collection Time: 12/29/20  7:12 PM  Result Value Ref Range   Glucose-Capillary 90 70 - 99 mg/dL  Glucose, capillary     Status: None   Collection Time: 12/29/20  9:39 PM  Result Value Ref Range   Glucose-Capillary 89 70 - 99 mg/dL  Basic metabolic panel     Status: Abnormal   Collection Time: 12/30/20  3:53 AM  Result Value Ref Range   Sodium 127 (L) 135 - 145 mmol/L   Potassium 3.9 3.5 - 5.1 mmol/L   Chloride 85 (L) 98 - 111 mmol/L   CO2 25 22 - 32 mmol/L   Glucose, Bld 118 (H) 70 - 99 mg/dL   BUN 58 (H) 8 - 23 mg/dL   Creatinine, Ser 4.92 (H) 0.44 - 1.00 mg/dL   Calcium 8.0 (L) 8.9 - 10.3 mg/dL   GFR, Estimated 9 (L) >60 mL/min   Anion gap 17 (H) 5 - 15  CBC     Status: Abnormal   Collection Time: 12/30/20  3:53 AM  Result Value Ref Range   WBC 8.5 4.0 - 10.5 K/uL   RBC 3.04 (L) 3.87 - 5.11 MIL/uL   Hemoglobin 8.3 (L) 12.0 - 15.0 g/dL   HCT 25.9 (L) 36.0 - 46.0 %   MCV 85.2 80.0 - 100.0 fL   MCH 27.3 26.0 - 34.0 pg   MCHC 32.0 30.0 - 36.0 g/dL   RDW 14.4 11.5 - 15.5 %  Platelets 316 150 - 400 K/uL   nRBC 0.0 0.0 - 0.2 %  Glucose, capillary     Status: Abnormal   Collection Time: 12/30/20   6:04 AM  Result Value Ref Range   Glucose-Capillary 112 (H) 70 - 99 mg/dL  Glucose, capillary     Status: Abnormal   Collection Time: 12/30/20  8:08 AM  Result Value Ref Range   Glucose-Capillary 117 (H) 70 - 99 mg/dL  Glucose, capillary     Status: Abnormal   Collection Time: 12/30/20 11:49 AM  Result Value Ref Range   Glucose-Capillary 124 (H) 70 - 99 mg/dL  Glucose, capillary     Status: Abnormal   Collection Time: 12/30/20  1:55 PM  Result Value Ref Range   Glucose-Capillary 110 (H) 70 - 99 mg/dL   Recent Results (from the past 240 hour(s))  Resp Panel by RT-PCR (Flu A&B, Covid) Nasopharyngeal Swab     Status: None   Collection Time: 12/27/20  8:53 AM   Specimen: Nasopharyngeal Swab; Nasopharyngeal(NP) swabs in vial transport medium  Result Value Ref Range Status   SARS Coronavirus 2 by RT PCR NEGATIVE NEGATIVE Final    Comment: (NOTE) SARS-CoV-2 target nucleic acids are NOT DETECTED.  The SARS-CoV-2 RNA is generally detectable in upper respiratory specimens during the acute phase of infection. The lowest concentration of SARS-CoV-2 viral copies this assay can detect is 138 copies/mL. A negative result does not preclude SARS-Cov-2 infection and should not be used as the sole basis for treatment or other patient management decisions. A negative result may occur with  improper specimen collection/handling, submission of specimen other than nasopharyngeal swab, presence of viral mutation(s) within the areas targeted by this assay, and inadequate number of viral copies(<138 copies/mL). A negative result must be combined with clinical observations, patient history, and epidemiological information. The expected result is Negative.  Fact Sheet for Patients:  EntrepreneurPulse.com.au  Fact Sheet for Healthcare Providers:  IncredibleEmployment.be  This test is no t yet approved or cleared by the [place] FDA and  has been authorized for  detection and/or diagnosis of SARS-CoV-2 by FDA under an Emergency Use Authorization (EUA). This EUA will remain  in effect (meaning this test can be used) for the duration of the COVID-19 declaration under Section 564(b)(1) of the Act, 21 U.S.C.section 360bbb-3(b)(1), unless the authorization is terminated  or revoked sooner.       Influenza A by PCR NEGATIVE NEGATIVE Final   Influenza B by PCR NEGATIVE NEGATIVE Final    Comment: (NOTE) The Xpert Xpress SARS-CoV-2/FLU/RSV plus assay is intended as an aid in the diagnosis of influenza from Nasopharyngeal swab specimens and should not be used as a sole basis for treatment. Nasal washings and aspirates are unacceptable for Xpert Xpress SARS-CoV-2/FLU/RSV testing.  Fact Sheet for Patients: EntrepreneurPulse.com.au  Fact Sheet for Healthcare Providers: IncredibleEmployment.be  This test is not yet approved or cleared by the [place] FDA and has been authorized for detection and/or diagnosis of SARS-CoV-2 by FDA under an Emergency Use Authorization (EUA). This EUA will remain in effect (meaning this test can be used) for the duration of the COVID-19 declaration under Section 564(b)(1) of the Act, 21 U.S.C. section 360bbb-3(b)(1), unless the authorization is terminated or revoked.  Performed at [facility], [place] [address], [place], [place] [address]     Renal Function: Recent Labs    12/27/20 0640 12/28/20 0336 12/29/20 0332 12/30/20 0353  CREATININE 3.53* 3.79* 4.28* 4.92*   Estimated  Creatinine Clearance: 9.8 mL/min (A) (by C-G formula based on SCr of 4.92 mg/dL (H)).  Radiologic Imaging: US RENAL  Result Date: 12/29/2020 CLINICAL DATA:  Hematuria. EXAM: RENAL / URINARY TRACT ULTRASOUND COMPLETE COMPARISON:  November 27, 2020. FINDINGS: Right Kidney: Renal measurements: 10.7 x 5.4 x 5.0 cm = volume: 150 mL. Echogenicity within normal limits. No mass or hydronephrosis  visualized. Left Kidney: Renal measurements: 10.3 x 4.9 x 4.5 cm = volume: 112 mL. Echogenicity within normal limits. No mass visualized. Mild left hydronephrosis is noted. Bladder: Appears normal for degree of bladder distention. Ureteral jets were not visualized. Other: None. IMPRESSION: Mild left hydronephrosis. CT urogram may be performed to evaluate for possible cause of obstruction. Electronically Signed   By: Marijo Conception M.D.   On: 12/29/2020 10:23   CT RENAL STONE STUDY  Result Date: 12/29/2020 CLINICAL DATA:  Hydronephrosis. EXAM: CT ABDOMEN AND PELVIS WITHOUT CONTRAST TECHNIQUE: Multidetector CT imaging of the abdomen and pelvis was performed following the standard protocol without IV contrast. COMPARISON:  Ultrasound renal 12/29/2020. CT abdomen and pelvis 06/21/2015. FINDINGS: Lower chest: There are patchy ground-glass and atelectatic changes in both lung bases, right greater than left. There also airspace opacities in the right lower lobe. Heart is enlarged. Hepatobiliary: No focal liver abnormality is seen. Status post cholecystectomy. No biliary dilatation. Pancreas: Unremarkable. No pancreatic ductal dilatation or surrounding inflammatory changes. Spleen: Normal in size without focal abnormality. Adrenals/Urinary Tract: There is mild left-sided hydroureteronephrosis. There is a 6 mm calculus in the distal left ureter best seen on coronal image 6/53. There is a single punctate left renal calculus. No bladder calculi are visualized. The right kidney and adrenal glands are within normal limits. Stomach/Bowel: Stomach is within normal limits. Appendix is not visualized. No dilated bowel loops are seen. No bowel obstruction. There is mild inflammatory stranding surrounding the gastric antrum and duodenum. Vascular/Lymphatic: Aortic atherosclerosis. No enlarged abdominal or pelvic lymph nodes. Reproductive: Uterus and bilateral adnexa are unremarkable. Other: There is a small amount of free fluid  in the right lower quadrant. There is no free air. There is mild diffuse body wall edema. There is no focal body wall hernia. Musculoskeletal: No fracture is seen. IMPRESSION: 1. 6 mm calculus in the distal left ureter with mild obstructive uropathy. 2. Additional nonobstructing left renal calculus. 3. Inflammatory stranding surrounding the gastric antrum and duodenum may be related to gastritis/duodenitis. Ulcer disease is a consideration. 4. Small amount of free fluid in the right lower quadrant. 5. Gro patchy ground-glass and airspace opacities in the lung bases, right greater than left, worrisome for infection and or edema. 6.  Aortic Atherosclerosis (ICD10-I70.0). Und-glass and Electronically Signed   By: Ronney Asters M.D.   On: 12/29/2020 18:40   DG HIP UNILAT WITH PELVIS 2-3 VIEWS LEFT  Result Date: 12/29/2020 CLINICAL DATA:  Status post fall EXAM: DG HIP (WITH OR WITHOUT PELVIS) 2-3V LEFT COMPARISON:  CT renal 12/29/2020 FINDINGS: There is no evidence of hip fracture or dislocation. There is no evidence of arthropathy or other focal bone abnormality. IMPRESSION: No acute displaced fracture or dislocation with better evaluation of the left hip on CT renal 12/29/2020. Electronically Signed   By: Iven Finn M.D.   On: 12/29/2020 20:21    I independently reviewed the above imaging studies.  Impression/Recommendation: Obstructing left ureteral stone: CT A/P 12/29/2020 with 6 mm distal left ureteral stone with mild obstructive hydronephrosis.  Afebrile.  No leukocytosis.  Creatinine 4.9 from baseline of 2.8.  No sign of infection. History of left renal artery stenosis AKI on CKD 4 History of HTN, diastolic CHF, CKD stage IV, CAD, PAD, anemia on chronic disease  -I reviewed CT A/P with evidence of obstructing left ureteral stone with mild obstructive uropathy.  Given rising creatinine, will proceed with left ureteral stent placement.  We will plan to trend creatinine following.  We will arrange  follow-up outpatient for definitive treatment of her stone. -She will require medical clearance prior to ureteroscopy.  --The risks, benefits and alternatives of cystoscopy with left JJ stent placement was discussed with the patient.  Risks include, but are not limited to: bleeding, urinary tract infection, ureteral injury, ureteral stricture disease, chronic pain, urinary symptoms, bladder injury, stent migration, the need for nephrostomy tube placement, MI, CVA, DVT, PE and the inherent risks with general anesthesia.  The patient voices understanding and wishes to proceed.    Matt R. Cornella Emmer MD 12/30/2020, 3:00 PM  [facility]  Pager: [phone]   CC: Darliss Cheney, MD

## 2020-12-30 NOTE — Op Note (Signed)
Operative Note  Preoperative diagnosis:  1.  Left ureteral stone  Postoperative diagnosis: 1.  Left ureteral stone  Procedure(s): 1.  Cystoscopy 2. Left retrograde pyelogram with interpretation 3. Left ureteral stent placement 4. Fluoroscopy <1 hour with intraoperative interpretation  Surgeon: Rexene Alberts, MD  Assistants:  None  Anesthesia:  General  Complications:  None  EBL:  Minimal  Specimens: 1. None  Drains/Catheters: 1.  Left 26Fr x 24cm ureteral stent  Intraoperative findings:   Cystoscopy demonstrated no suspicious lesions, masses, stones or other pathology. Left retrograde pyelogram demonstrated moderate left hydronephrosis. Successful left ureteral stent placement with curl in the left upper pole and bladder respectively.  Indication:  Molly [name] is a 70 y.o. female with CT A/P demonstrating a 52m left ureteral stone with evidence of obstruction. After reviewing the management options for treatment, she elected to proceed with the above surgical procedure(s). We have discussed the potential benefits and risks of the procedure, side effects of the proposed treatment, the likelihood of the patient achieving the goals of the procedure, and any potential problems that might occur during the procedure or recuperation. Informed consent has been obtained.  Description of procedure: The patient was taken to the operating room and general anesthesia was induced.  The patient was placed in the dorsal lithotomy position, prepped and draped in the usual sterile fashion, and preoperative antibiotics were administered. A preoperative time-out was performed.   Cystourethroscopy was performed.  The patient's urethra was examined and was normal. The bladder was then systematically examined in its entirety. There was no evidence for any bladder tumors, stones, or other mucosal pathology.    Attention then turned to the left ureteral orifice. A 0.038 zip wire was passed through  the left orifice and over the wire a 5 Fr open ended catheter was inserted and passed up to the level of the renal pelvis. Omnipaque contrast was injected through the ureteral catheter and a retrograde pyelogram was performed with findings as dictated above. The wire was then replaced and the open ended catheter was removed.   A 6Fr x 24cm ureteral stent was advance over the wire. The stent was positioned appropriately under fluoroscopic and cystoscopic guidance.  The wire was then removed with an adequate stent curl noted in the renal pelvis as well as in the bladder.  The bladder was then emptied and the procedure ended.  The patient appeared to tolerate the procedure well and without complications.  The patient was able to be awakened and transferred to the recovery unit in satisfactory condition.   Plan:  Trend creatinine. Messaged schedulers to arrange for f/u outpatient to discuss treating her left ureteral stone.  Matt R. [facility]  Pager: [phone]

## 2020-12-30 NOTE — Progress Notes (Signed)
Full consult note to follow. 70 yF with left artery stenosis, AKI now with 73mleft ureteral stone with mild obstructive uropathy. Made NPO. Will plan for add on left ureteral stent today.  Matt R. [facility]  Pager: [phone]

## 2020-12-30 NOTE — Transfer of Care (Signed)
Immediate Anesthesia Transfer of Care Note  Patient: Molly [name]  Procedure(s) Performed: CYSTOSCOPY WITH RETROGRADE PYELOGRAM/URETERAL STENT PLACEMENT (Left)  Patient Location: PACU  Anesthesia Type:General  Level of Consciousness: drowsy and patient cooperative  Airway & Oxygen Therapy: Patient Spontanous Breathing and CPAP  Post-op Assessment: Report given to RN, Post -op Vital signs reviewed and stable and Patient moving all extremities X 4  Post vital signs: Reviewed and stable  Last Vitals:  Vitals Value Taken Time  BP 122/65 12/30/20 1627  Temp    Pulse 71 12/30/20 1628  Resp 17 12/30/20 1628  SpO2 100 % 12/30/20 1628  Vitals shown include unvalidated device data.  Last Pain:  Vitals:   12/30/20 1421  TempSrc: Oral  PainSc:          Complications: No notable events documented.

## 2020-12-30 NOTE — Anesthesia Procedure Notes (Signed)
Procedure Name: LMA Insertion Date/Time: 12/30/2020 3:24 PM Performed by: Annamary Carolin, CRNA Pre-anesthesia Checklist: Patient identified, Emergency Drugs available, Suction available and Patient being monitored Patient Re-evaluated:Patient Re-evaluated prior to induction Oxygen Delivery Method: Circle System Utilized Preoxygenation: Pre-oxygenation with 100% oxygen Induction Type: IV induction Ventilation: Mask ventilation without difficulty LMA: LMA inserted LMA Size: 4.0 Number of attempts: 1 Airway Equipment and Method: Bite block Placement Confirmation: positive ETCO2 Tube secured with: Tape Dental Injury: Teeth and Oropharynx as per pre-operative assessment

## 2020-12-30 NOTE — Progress Notes (Signed)
Pt refuses Bipap QHS.  Pt states she doesn't wear one at home.  Pt on 4L [place]. No distress at this time

## 2020-12-30 NOTE — Progress Notes (Signed)
[facility] Progress Note    Assessment/ Plan:   [name] is a 70yoF with a PMHx of HTN, left renal artery stenosis, HFpEF, CKD 3/4, CAD, PAD and diabetes who presented with chest pain, acute hypoxemic respiratory failure and hypertensive emergency.    AKI on CKD 3-4- Likely secondary to hemodynamic insults from HTN crisis, as well as renal artery stenosis and afib w/ RVR. Last Cr outpatient ~2.6 on 10/15/2020. Was recently discharged with a Cr of 3.4. Cr worse today at 4.9. Renal US showed mild left hydronephrosis and CT urogram confirmed obstructing left distal ureter 6 mm stone. Urology plans to stent today. No urgent indication for renal replacement therapy. Plan to trend renal function post stent placement to see if there is improvement although HD may be inevitable. -Will ultimately need treatment of RAS. Avoid RAAS blockers or MRA's -Trend BMP, renal dose medications for GFR<15 -Monitor Daily I/Os, Daily weight  -Maintain MAP>65 for optimal renal perfusion.  -Continue to hold ACE-I, avoid further nephrotoxins including NSAIDS, Morphine. Unless absolutely necessary, avoid CT with contrast and/or MRI with gadolinium.   Microscopic hematuria- likely due to mildly obstructing 6 mm distal left ureter renal stone seen on CT urogram. Urology plans to stent today.  - ANCA and lupus serology pending  Epistaxis- overnight 9/14, since resolved. Thought to be due to nasal canula. Heparin on hold, continue to monitor.   Left renal artery stenosis- Hx of 80-90% proximal left renal artery stenosis. -as resp status and Afib improve, considering CO2 and stent of left renal artery however on hold given the fact that there is some contrast given   AHRF, acute on chronic dCHF exacerbation- likely flash pulmonary edema. Holding lasix in the setting of AKI. Minimal urine output, can try lasix challenge to help [place] determine if she is getting closer to needing HD either today or tomorrow.     Hypertension- BP well controlled on norvasc, metoprolol, and hydralazine   Hyponatremia- Stable, 127. Likely worsened in the setting of lasix and AKI.  -Continue fluid restriction 1.5L   Afib w/ RVR-on amio. Hep gtt on hold given epistaxis, which has resolved.   Anemia due to chronic kidney disease-Hgb downtrending, 8.3 today. Hep gtt on hold given nosebleeds -Transfuse for Hgb<7 g/dL   Diabetes Mellitus Type 2 with Hyperglycemia- -per primary    Subjective:    Patient reports some hip pain this morning and feeling sleepy. Denies any SOB or chest pain. States she did not have any more nose bleeds overnight. Understands she has a renal stone and plan for stent today.   Objective:   BP (!) 111/55 (BP Location: Right Arm)   Pulse 81   Temp (!) 97.5 F (36.4 C) (Oral)   Resp (!) 22   Ht '5\' 2"'$  (1.575 m)   Wt 67 kg   SpO2 93%   BMI 27.02 kg/m   Intake/Output Summary (Last 24 hours) at 12/30/2020 [id number] Last data filed at 12/30/2020 [id number] Gross per 24 hour  Intake 343 ml  Output 75 ml  Net 268 ml   Weight change: -0.042 kg  Physical Exam: Gen:NAD, laying flat in bed CVS:RRR, no murmurs Resp:crackles bibasilar, normal WOB, speaking in full sentences [id number], nontender Ext:no edema Neuro: awake, alert, speech clear and coherent  Imaging: US RENAL  Result Date: 12/29/2020 CLINICAL DATA:  Hematuria. EXAM: RENAL / URINARY TRACT ULTRASOUND COMPLETE COMPARISON:  November 27, 2020. FINDINGS: Right Kidney: Renal measurements: 10.7 x 5.4 x 5.0 cm =  volume: 150 mL. Echogenicity within normal limits. No mass or hydronephrosis visualized. Left Kidney: Renal measurements: 10.3 x 4.9 x 4.5 cm = volume: 112 mL. Echogenicity within normal limits. No mass visualized. Mild left hydronephrosis is noted. Bladder: Appears normal for degree of bladder distention. Ureteral jets were not visualized. Other: None. IMPRESSION: Mild left hydronephrosis. CT urogram may be performed to evaluate for  possible cause of obstruction. Electronically Signed   By: Marijo Conception M.D.   On: 12/29/2020 10:23   CT RENAL STONE STUDY  Result Date: 12/29/2020 CLINICAL DATA:  Hydronephrosis. EXAM: CT ABDOMEN AND PELVIS WITHOUT CONTRAST TECHNIQUE: Multidetector CT imaging of the abdomen and pelvis was performed following the standard protocol without IV contrast. COMPARISON:  Ultrasound renal 12/29/2020. CT abdomen and pelvis 06/21/2015. FINDINGS: Lower chest: There are patchy ground-glass and atelectatic changes in both lung bases, right greater than left. There also airspace opacities in the right lower lobe. Heart is enlarged. Hepatobiliary: No focal liver abnormality is seen. Status post cholecystectomy. No biliary dilatation. Pancreas: Unremarkable. No pancreatic ductal dilatation or surrounding inflammatory changes. Spleen: Normal in size without focal abnormality. Adrenals/Urinary Tract: There is mild left-sided hydroureteronephrosis. There is a 6 mm calculus in the distal left ureter best seen on coronal image 6/53. There is a single punctate left renal calculus. No bladder calculi are visualized. The right kidney and adrenal glands are within normal limits. Stomach/Bowel: Stomach is within normal limits. Appendix is not visualized. No dilated bowel loops are seen. No bowel obstruction. There is mild inflammatory stranding surrounding the gastric antrum and duodenum. Vascular/Lymphatic: Aortic atherosclerosis. No enlarged abdominal or pelvic lymph nodes. Reproductive: Uterus and bilateral adnexa are unremarkable. Other: There is a small amount of free fluid in the right lower quadrant. There is no free air. There is mild diffuse body wall edema. There is no focal body wall hernia. Musculoskeletal: No fracture is seen. IMPRESSION: 1. 6 mm calculus in the distal left ureter with mild obstructive uropathy. 2. Additional nonobstructing left renal calculus. 3. Inflammatory stranding surrounding the gastric antrum and  duodenum may be related to gastritis/duodenitis. Ulcer disease is a consideration. 4. Small amount of free fluid in the right lower quadrant. 5. Gro patchy ground-glass and airspace opacities in the lung bases, right greater than left, worrisome for infection and or edema. 6.  Aortic Atherosclerosis (ICD10-I70.0). Und-glass and Electronically Signed   By: Ronney Asters M.D.   On: 12/29/2020 18:40   DG HIP UNILAT WITH PELVIS 2-3 VIEWS LEFT  Result Date: 12/29/2020 CLINICAL DATA:  Status post fall EXAM: DG HIP (WITH OR WITHOUT PELVIS) 2-3V LEFT COMPARISON:  CT renal 12/29/2020 FINDINGS: There is no evidence of hip fracture or dislocation. There is no evidence of arthropathy or other focal bone abnormality. IMPRESSION: No acute displaced fracture or dislocation with better evaluation of the left hip on CT renal 12/29/2020. Electronically Signed   By: Iven Finn M.D.   On: 12/29/2020 20:21   ECHOCARDIOGRAM LIMITED  Result Date: 12/28/2020    ECHOCARDIOGRAM LIMITED REPORT   Patient Name:   Molly [name] Date of Exam: 12/28/2020 Medical Rec #:  [id number]    Height:       62.0 in Accession #:    [id number]   Weight:       145.7 lb Date of Birth:  1950/12/09    BSA:          1.671 m Patient Age:    20 years     BP:  125/93 mmHg Patient Gender: F            HR:           113 bpm. Exam Location:  Inpatient Procedure: Limited Echo, Limited Color Doppler and Cardiac Doppler Indications:    R07.9* Chest pain, unspecified. Elevated Troponin  History:        Patient has prior history of Echocardiogram examinations, most                 recent 11/27/2020. Risk Factors:Hypertension, Dyslipidemia and                 Diabetes.  Sonographer:    Bernadene Person RDCS Referring Phys: [id number] [place]  1. Compared with the echo [id number], systolic function is reduced. Left ventricular ejection fraction, by estimation, is 40 to 45%. The left ventricle has mildly decreased function. The left ventricle  demonstrates global hypokinesis. Left ventricular diastolic parameters are indeterminate.  2. Right ventricular systolic function is normal. The right ventricular size is normal. There is moderately elevated pulmonary artery systolic pressure.  3. The pericardial effusion is circumferential.  4. The mitral valve is normal in structure. Mild mitral valve regurgitation. No evidence of mitral stenosis.  5. The aortic valve is tricuspid. There is mild calcification of the aortic valve. There is mild thickening of the aortic valve. Aortic valve regurgitation is not visualized. No aortic stenosis is present.  6. The inferior vena cava is dilated in size with <50% respiratory variability, suggesting right atrial pressure of 15 mmHg. FINDINGS  Left Ventricle: Compared with the echo A999333, systolic function is reduced. Left ventricular ejection fraction, by estimation, is 40 to 45%. The left ventricle has mildly decreased function. The left ventricle demonstrates global hypokinesis. The left ventricular internal cavity size was normal in size. There is no left ventricular hypertrophy. Left ventricular diastolic parameters are indeterminate. Right Ventricle: The right ventricular size is normal. No increase in right ventricular wall thickness. Right ventricular systolic function is normal. There is moderately elevated pulmonary artery systolic pressure. The tricuspid regurgitant velocity is 2.87 m/s, and with an assumed right atrial pressure of 15 mmHg, the estimated right ventricular systolic pressure is [id number] mmHg. Left Atrium: Left atrial size was normal in size. Right Atrium: Right atrial size was normal in size. Pericardium: Trivial pericardial effusion is present. The pericardial effusion is circumferential. Mitral Valve: The mitral valve is normal in structure. Mild mitral annular calcification. Mild mitral valve regurgitation. No evidence of mitral valve stenosis. Tricuspid Valve: The tricuspid valve is normal in  structure. Tricuspid valve regurgitation is trivial. No evidence of tricuspid stenosis. Aortic Valve: The aortic valve is tricuspid. There is mild calcification of the aortic valve. There is mild thickening of the aortic valve. Aortic valve regurgitation is not visualized. No aortic stenosis is present. Pulmonic Valve: The pulmonic valve was normal in structure. Pulmonic valve regurgitation is trivial. No evidence of pulmonic stenosis. Aorta: The aortic root is normal in size and structure. Venous: The inferior vena cava is dilated in size with less than 50% respiratory variability, suggesting right atrial pressure of 15 mmHg. IAS/Shunts: No atrial level shunt detected by color flow Doppler. LEFT VENTRICLE PLAX 2D LVIDd:         4.40 cm LVIDs:         2.05 cm LV PW:         1.00 cm LV IVS:        1.00 cm LVOT diam:  2.10 cm LV SV:         46 LV SV Index:   27 LVOT Area:     3.46 cm  LV Volumes (MOD) LV vol d, MOD A2C: 108.0 ml LV vol d, MOD A4C: 80.2 ml LV vol s, MOD A2C: 66.1 ml LV vol s, MOD A4C: 47.1 ml LV SV MOD A2C:     41.9 ml LV SV MOD A4C:     80.2 ml LV SV MOD BP:      37.5 ml RIGHT VENTRICLE TAPSE (M-mode): 1.1 cm LEFT ATRIUM         Index LA diam:    4.30 cm 2.57 cm/m  AORTIC VALVE LVOT Vmax:   87.37 cm/s LVOT Vmean:  63.833 cm/s LVOT VTI:    0.133 m  AORTA Ao Root diam: 3.20 cm MR Peak grad:    90.2 mmHg   TRICUSPID VALVE MR Mean grad:    57.0 mmHg   TR Peak grad:   32.9 mmHg MR Vmax:         475.00 cm/s TR Vmax:        287.00 cm/s MR Vmean:        357.0 cm/s MR PISA:         1.01 cm    SHUNTS MR PISA Eff ROA: 8 mm       Systemic VTI:  0.13 m MR PISA Radius:  0.40 cm     Systemic Diam: 2.10 cm Skeet Latch MD Electronically signed by Skeet Latch MD Signature Date/Time: 12/28/2020/1:40:55 PM    Final     Labs: BMET Recent Labs  Lab 12/27/20 [id number] 12/27/20 0847 12/28/20 0336 12/29/20 0332 12/30/20 0353  NA 131* 130* 129* 126* 127*  K 3.8 3.8 3.5 3.7 3.9  CL 91*  --  87* 84* 85*   CO2 26  --  '31 26 25  '$ GLUCOSE 166*  --  177* 160* 118*  BUN 36*  --  44* 49* 58*  CREATININE 3.53*  --  3.79* 4.28* 4.92*  CALCIUM 9.7  --  8.7* 8.2* 8.0*   CBC Recent Labs  Lab 12/27/20 0640 12/27/20 0847 12/29/20 0332 12/30/20 0353  WBC 8.8  --  9.0 8.5  HGB 11.4* 12.9 9.0* 8.3*  HCT 35.9* 38.0 27.6* 25.9*  MCV 87.3  --  83.6 85.2  PLT 369  --  296 316    Medications:     amiodarone  400 mg Oral BID   Followed by   Derrill Memo ON 01/05/2021] amiodarone  200 mg Oral Daily   amLODipine  10 mg Oral Daily   cilostazol  50 mg Oral BID   ezetimibe  10 mg Oral Daily   famotidine  20 mg Oral Daily   hydrALAZINE  25 mg Oral Q8H   insulin aspart  0-6 Units Subcutaneous TID WC   insulin aspart protamine- aspart  8 Units Subcutaneous Q breakfast   isosorbide mononitrate  30 mg Oral Daily   metoprolol succinate  50 mg Oral Daily   rosuvastatin  40 mg Oral Daily   sodium chloride flush  3 mL Intravenous Q12H     Akiyah Eppolito, DO PGY-3 IMTS  12/30/2020, 9:28 AM

## 2020-12-30 NOTE — Discharge Instructions (Addendum)
[facility] [phone] Post Stent Instructions  Definitions:  Ureter: The duct that transports urine from the kidney to the bladder. Stent:   A plastic hollow tube that is placed into the ureter, from the kidney to the bladder to prevent the ureter from swelling shut.  GENERAL INSTRUCTIONS:  Despite the fact that no skin incisions were used, the area around the ureter and bladder is raw and irritated. The stent is a foreign body which will further irritate the bladder wall. This irritation is manifested by increased frequency of urination, both day and night, and by an increase in the urge to urinate. In some, the urge to urinate is present almost always. Sometimes the urge is strong enough that you may not be able to stop yourself from urinating. The only real cure is to remove the stent and then give time for the bladder wall to heal which can't be done until the danger of the ureter swelling shut has passed, which varies.  You may see some blood in your urine while the stent is in place and a few days afterwards. Do not be alarmed, even if the urine was clear for a while. Get off your feet and drink lots of fluids until clearing occurs. If you start to pass clots or don't improve, call us.  DIET: You may return to your normal diet immediately. Because of the raw surface of your bladder, alcohol, spicy foods, acid type foods and drinks with caffeine may cause irritation or frequency and should be used in moderation. To keep your urine flowing freely and to avoid constipation, drink plenty of fluids during the day ( 8-10 glasses ). Tip: Avoid cranberry juice because it is very acidic.  ACTIVITY: Your physical activity doesn't need to be restricted. However, if you are very active, you may see some blood in your urine. We suggest that you reduce your activity under these circumstances until the bleeding has stopped.  BOWELS: It is important to keep your bowels regular during  the postoperative period. Straining with bowel movements can cause bleeding. A bowel movement every other day is reasonable. Use a mild laxative if needed, such as Milk of Magnesia 2-3 tablespoons, or 2 Dulcolax tablets. Call if you continue to have problems. If you have been taking narcotics for pain, before, during or after your surgery, you may be constipated. Take a laxative if necessary.   MEDICATION: You should resume your pre-surgery medications unless told not to. In addition you will often be given an antibiotic to prevent infection. These should be taken as prescribed until the bottles are finished unless you are having an unusual reaction to one of the drugs.  PROBLEMS YOU SHOULD REPORT TO [place]: Fevers over 100.5 Fahrenheit. Heavy bleeding, or clots ( See above notes about blood in urine ). Inability to urinate. Drug reactions ( hives, rash, nausea, vomiting, diarrhea ). Severe burning or pain with urination that is not improving.  FOLLOW-UP: You will need a follow-up appointment to monitor your progress. Call for this appointment at the number listed above. Usually the first appointment will be about three to fourteen days after your surgery.     [facility]  Discharge Instructions  AV Fistula or Graft Surgery for Dialysis Access  Please refer to the following instructions for your post-procedure care. Your surgeon or physician assistant will discuss any changes with you.  Activity  You may drive the day following your surgery, if you are comfortable and no  longer taking prescription pain medication. Resume full activity as the soreness in your incision resolves.  Bathing/Showering  You may shower after you go home. Keep your incision dry for 48 hours. Do not soak in a bathtub, hot tub, or swim until the incision heals completely. You may not shower if you have a hemodialysis catheter.  Incision Care  Clean your incision with mild soap  and water after 48 hours. Pat the area dry with a clean towel. You do not need a bandage unless otherwise instructed. Do not apply any ointments or creams to your incision. You may have skin glue on your incision. Do not peel it off. It will come off on its own in about one week. Your arm may swell a bit after surgery. To reduce swelling use pillows to elevate your arm so it is above your heart. Your doctor will tell you if you need to lightly wrap your arm with an ACE bandage.  Diet  Resume your normal diet. There are not special food restrictions following this procedure. In order to heal from your surgery, it is CRITICAL to get adequate nutrition. Your body requires vitamins, minerals, and protein. Vegetables are the best source of vitamins and minerals. Vegetables also provide the perfect balance of protein. Processed food has little nutritional value, so try to avoid this.  Medications  Resume taking all of your medications. If your incision is causing pain, you may take over-the counter pain relievers such as acetaminophen (Tylenol). If you were prescribed a stronger pain medication, please be aware these medications can cause nausea and constipation. Prevent nausea by taking the medication with a snack or meal. Avoid constipation by drinking plenty of fluids and eating foods with high amount of fiber, such as fruits, vegetables, and grains.  Do not take Tylenol if you are taking prescription pain medications.  Follow up Your surgeon may want to see you in the office following your access surgery. If so, this will be arranged at the time of your surgery.  Please call us immediately for any of the following conditions:  Increased pain, redness, drainage (pus) from your incision site Fever of 101 degrees or higher Severe or worsening pain at your incision site Hand pain or numbness.  Reduce your risk of vascular disease:  Stop smoking. If you would like help, call [facility] at  1-800-QUIT-NOW 785 803 8986) or [place] at [place] your cholesterol Maintain a desired weight Control your diabetes Keep your blood pressure down  Dialysis  It will take several weeks to several months for your new dialysis access to be ready for use. Your surgeon will determine when it is okay to use it. Your nephrologist will continue to direct your dialysis. You can continue to use your Permcath until your new access is ready for use.   01/10/2021 Molly [name] [id number] December 09, 1950  Surgeon(s): Cherre Robins, MD  Procedure(s): LEFT ARM BRACHIOCEPHALIC  ARTERIOVENOUS (AV) FISTULA CREATION INSERTION OF TUNNELED DIALYSIS CATHETER RIGHT INTERNAL JUGULAR THROMBECTOMY LEFT CEPHALIC VEIN REMOVAL OF TEMPORARY DIALYSIS CATHETER RIGHT GROIN   May stick graft immediately   May stick graft on designated area only:   X Do not stick left AV fistula for 12 weeks    If you have any questions, please call the office at [phone].  Information on my medicine - ELIQUIS (apixaban)  Why was Eliquis prescribed for you? Eliquis was prescribed for you to reduce the risk of a blood clot forming that can cause a stroke  if you have a medical condition called atrial fibrillation (a type of irregular heartbeat).  What do You need to know about Eliquis ? Take your Eliquis TWICE DAILY - one tablet in the morning and one tablet in the evening with or without food. If you have difficulty swallowing the tablet whole please discuss with your pharmacist how to take the medication safely.  Take Eliquis exactly as prescribed by your doctor and DO NOT stop taking Eliquis without talking to the doctor who prescribed the medication.  Stopping may increase your risk of developing a stroke.  Refill your prescription before you run out.  After discharge, you should have regular check-up appointments with your healthcare provider that is prescribing your Eliquis.  In the future your  dose may need to be changed if your kidney function or weight changes by a significant amount or as you get older.  What do you do if you miss a dose? If you miss a dose, take it as soon as you remember on the same day and resume taking twice daily.  Do not take more than one dose of ELIQUIS at the same time to make up a missed dose.  Important Safety Information A possible side effect of Eliquis is bleeding. You should call your healthcare provider right away if you experience any of the following: Bleeding from an injury or your nose that does not stop. Unusual colored urine (red or dark brown) or unusual colored stools (red or black). Unusual bruising for unknown reasons. A serious fall or if you hit your head (even if there is no bleeding).  Some medicines may interact with Eliquis and might increase your risk of bleeding or clotting while on Eliquis. To help avoid this, consult your healthcare provider or pharmacist prior to using any new prescription or non-prescription medications, including herbals, vitamins, non-steroidal anti-inflammatory drugs (NSAIDs) and supplements.  This website has more information on Eliquis (apixaban): http://www.eliquis.com/eliquis/home

## 2020-12-31 ENCOUNTER — Inpatient Hospital Stay (HOSPITAL_COMMUNITY): Payer: Medicare Other

## 2020-12-31 ENCOUNTER — Encounter (HOSPITAL_COMMUNITY): Payer: Self-pay | Admitting: Urology

## 2020-12-31 DIAGNOSIS — R040 Epistaxis: Secondary | ICD-10-CM | POA: Diagnosis not present

## 2020-12-31 DIAGNOSIS — D649 Anemia, unspecified: Secondary | ICD-10-CM | POA: Diagnosis not present

## 2020-12-31 DIAGNOSIS — J9621 Acute and chronic respiratory failure with hypoxia: Secondary | ICD-10-CM | POA: Diagnosis not present

## 2020-12-31 DIAGNOSIS — J9622 Acute and chronic respiratory failure with hypercapnia: Secondary | ICD-10-CM | POA: Diagnosis not present

## 2020-12-31 DIAGNOSIS — Z7901 Long term (current) use of anticoagulants: Secondary | ICD-10-CM

## 2020-12-31 DIAGNOSIS — R195 Other fecal abnormalities: Secondary | ICD-10-CM

## 2020-12-31 LAB — GLUCOSE, CAPILLARY
Glucose-Capillary: 150 mg/dL — ABNORMAL HIGH (ref 70–99)
Glucose-Capillary: 151 mg/dL — ABNORMAL HIGH (ref 70–99)
Glucose-Capillary: 172 mg/dL — ABNORMAL HIGH (ref 70–99)
Glucose-Capillary: 188 mg/dL — ABNORMAL HIGH (ref 70–99)

## 2020-12-31 LAB — BASIC METABOLIC PANEL
Anion gap: 20 — ABNORMAL HIGH (ref 5–15)
BUN: 64 mg/dL — ABNORMAL HIGH (ref 8–23)
CO2: 22 mmol/L (ref 22–32)
Calcium: 7.8 mg/dL — ABNORMAL LOW (ref 8.9–10.3)
Chloride: 84 mmol/L — ABNORMAL LOW (ref 98–111)
Creatinine, Ser: 5.56 mg/dL — ABNORMAL HIGH (ref 0.44–1.00)
GFR, Estimated: 8 mL/min — ABNORMAL LOW (ref >60)
Glucose, Bld: 130 mg/dL — ABNORMAL HIGH (ref 70–99)
Potassium: 4.4 mmol/L (ref 3.5–5.1)
Sodium: 126 mmol/L — ABNORMAL LOW (ref 135–145)

## 2020-12-31 LAB — HEPATITIS B SURFACE ANTIGEN: Hepatitis B Surface Ag: NONREACTIVE

## 2020-12-31 LAB — CBC
HCT: 26.6 % — ABNORMAL LOW (ref 36.0–46.0)
Hemoglobin: 8.2 g/dL — ABNORMAL LOW (ref 12.0–15.0)
MCH: 26.9 pg (ref 26.0–34.0)
MCHC: 30.8 g/dL (ref 30.0–36.0)
MCV: 87.2 fL (ref 80.0–100.0)
Platelets: 338 10*3/uL (ref 150–400)
RBC: 3.05 MIL/uL — ABNORMAL LOW (ref 3.87–5.11)
RDW: 14.7 % (ref 11.5–15.5)
WBC: 7.3 10*3/uL (ref 4.0–10.5)
nRBC: 0 % (ref 0.0–0.2)

## 2020-12-31 LAB — HEPATITIS B SURFACE ANTIBODY,QUALITATIVE: Hep B S Ab: REACTIVE — AB

## 2020-12-31 MED ORDER — LIDOCAINE HCL 1 % IJ SOLN
INTRAMUSCULAR | Status: AC
  Filled 2020-12-31: qty 20

## 2020-12-31 MED ORDER — SODIUM CHLORIDE 0.9 % IV SOLN: 100.0000 mL | INTRAVENOUS | Status: DC | PRN

## 2020-12-31 MED ORDER — CHLORHEXIDINE GLUCONATE CLOTH 2 % EX PADS
6.0000 | MEDICATED_PAD | Freq: Every day | CUTANEOUS | Status: DC
Start: 2020-12-31 — End: 2021-01-03
  Administered 2020-12-31 – 2021-01-03 (×4): 6 via TOPICAL

## 2020-12-31 MED ORDER — LIDOCAINE HCL (PF) 1 % IJ SOLN: 5.0000 mL | INTRAMUSCULAR | Status: DC | PRN

## 2020-12-31 MED ORDER — NALOXONE HCL 0.4 MG/ML IJ SOLN
0.4000 mg | INTRAMUSCULAR | Status: DC | PRN
  Administered 2020-12-31: 0.4 mg via INTRAVENOUS
  Filled 2020-12-31: qty 1

## 2020-12-31 MED ORDER — AMIODARONE HCL IN DEXTROSE 360-4.14 MG/200ML-% IV SOLN: 30.0000 mg/h | INTRAVENOUS | Status: DC

## 2020-12-31 MED ORDER — HEPARIN SODIUM (PORCINE) 1000 UNIT/ML DIALYSIS
1000.0000 [IU] | INTRAMUSCULAR | Status: DC | PRN
  Filled 2020-12-31: qty 1

## 2020-12-31 MED ORDER — ALTEPLASE 2 MG IJ SOLR: 2.0000 mg | Freq: Once | INTRAMUSCULAR | Status: DC | PRN

## 2020-12-31 MED ORDER — HEPARIN SODIUM (PORCINE) 1000 UNIT/ML IJ SOLN
INTRAMUSCULAR | Status: AC
  Filled 2020-12-31: qty 1

## 2020-12-31 MED ORDER — AMIODARONE HCL IN DEXTROSE 360-4.14 MG/200ML-% IV SOLN
30.0000 mg/h | INTRAVENOUS | Status: DC
  Administered 2020-12-31 – 2021-01-03 (×7): 30 mg/h via INTRAVENOUS
  Filled 2020-12-31 (×8): qty 200

## 2020-12-31 MED ORDER — FUROSEMIDE 10 MG/ML IJ SOLN
80.0000 mg | Freq: Once | INTRAMUSCULAR | Status: AC
  Administered 2020-12-31: 80 mg via INTRAVENOUS
  Filled 2020-12-31: qty 8

## 2020-12-31 MED ORDER — PENTAFLUOROPROP-TETRAFLUOROETH EX AERO: 1.0000 "application " | INHALATION_SPRAY | CUTANEOUS | Status: DC | PRN

## 2020-12-31 MED ORDER — SODIUM CHLORIDE 0.9 % IV SOLN
1.0000 g | Freq: Once | INTRAVENOUS | Status: AC
  Administered 2020-12-31: 1 g via INTRAVENOUS

## 2020-12-31 MED ORDER — LIDOCAINE-PRILOCAINE 2.5-2.5 % EX CREA: 1.0000 "application " | TOPICAL_CREAM | CUTANEOUS | Status: DC | PRN

## 2020-12-31 NOTE — Plan of Care (Addendum)
GI consult requested per Dr Audie Box request for anemia and gastritis, appreciated.

## 2020-12-31 NOTE — Progress Notes (Signed)
Informed by RN that she was unable to have her catheter placed due to her being combative/screaming, refusing to have catheter placed (quite surprising since she was very somnolent for me all day). Agree with her needing general anesthesia which may happen tomorrow. Will lasix challenge her now to see if she will have any urine output.  Plan for HD tomorrow.

## 2020-12-31 NOTE — Progress Notes (Signed)
Cardiology Progress Note  Patient ID: Molly [name] MRN: [id number] DOB: [birth year] Date of Encounter: 12/31/2020  Primary Cardiologist: Jenne Campus, MD  Subjective   Chief Complaint: Confused  HPI: Very confused this morning.  Reports he is in pain.  Maintaining sinus rhythm.  Plans for initiation of hemodialysis.  Kidney function continues to decline.  Ureteral stent was placed yesterday.  ROS:  All other ROS reviewed and negative. Pertinent positives noted in the HPI.     Inpatient Medications  Scheduled Meds:  amiodarone  400 mg Oral BID   Followed by   Derrill Memo ON 01/05/2021] amiodarone  200 mg Oral Daily   amLODipine  10 mg Oral Daily   Chlorhexidine Gluconate Cloth  6 each Topical Q0600   cilostazol  50 mg Oral BID   ezetimibe  10 mg Oral Daily   famotidine  20 mg Oral Daily   hydrALAZINE  25 mg Oral Q8H   insulin aspart  0-6 Units Subcutaneous TID WC   insulin aspart protamine- aspart  8 Units Subcutaneous Q breakfast   isosorbide mononitrate  30 mg Oral Daily   metoprolol succinate  50 mg Oral Daily   pantoprazole (PROTONIX) IV  40 mg Intravenous Daily   rosuvastatin  40 mg Oral Daily   sodium chloride flush  3 mL Intravenous Q12H   Continuous Infusions:  sodium chloride     sodium chloride     sodium chloride     cefTRIAXone (ROCEPHIN)  IV     PRN Meds: sodium chloride, sodium chloride, sodium chloride, acetaminophen, alteplase, heparin, hydrocortisone cream, HYDROmorphone (DILAUDID) injection, lidocaine (PF), lidocaine-prilocaine, loperamide, loratadine, naLOXone (NARCAN)  injection, pentafluoroprop-tetrafluoroeth, sodium chloride, sodium chloride flush   Vital Signs   Vitals:   12/31/20 0100 12/31/20 0458 12/31/20 0907 12/31/20 0958  BP:  (!) 110/44 131/89 (!) 123/58  Pulse:  61 87 72  Resp:  '18 18 16  '$ Temp:  (!) 97.4 F (36.3 C) (!) 97.4 F (36.3 C) 97.6 F (36.4 C)  TempSrc:  Oral Oral Oral  SpO2: 96% 99% 95% 100%  Weight:  70.9 kg    Height:         Intake/Output Summary (Last 24 hours) at 12/31/2020 1054 Last data filed at 12/31/2020 0502 Gross per 24 hour  Intake 1150 ml  Output 101 ml  Net 1049 ml   Last 3 Weights 12/31/2020 12/30/2020 12/29/2020  Weight (lbs) 156 lb 4.9 oz 147 lb 11.3 oz 147 lb 12.8 oz  Weight (kg) 70.9 kg 67 kg 67.042 kg      Telemetry  Overnight telemetry shows sinus rhythm in the 70s, which I personally reviewed.   Physical Exam   Vitals:   12/31/20 0100 12/31/20 0458 12/31/20 0907 12/31/20 0958  BP:  (!) 110/44 131/89 (!) 123/58  Pulse:  61 87 72  Resp:  '18 18 16  '$ Temp:  (!) 97.4 F (36.3 C) (!) 97.4 F (36.3 C) 97.6 F (36.4 C)  TempSrc:  Oral Oral Oral  SpO2: 96% 99% 95% 100%  Weight:  70.9 kg    Height:        Intake/Output Summary (Last 24 hours) at 12/31/2020 1054 Last data filed at 12/31/2020 0502 Gross per 24 hour  Intake 1150 ml  Output 101 ml  Net 1049 ml    Last 3 Weights 12/31/2020 12/30/2020 12/29/2020  Weight (lbs) 156 lb 4.9 oz 147 lb 11.3 oz 147 lb 12.8 oz  Weight (kg) 70.9 kg 67 kg 67.042 kg  Body mass index is 28.59 kg/m.   General: Ill-appearing, confused Head: Atraumatic, normal size  Eyes: PEERLA, EOMI  Neck: Supple, JVD 8 to 10 cm of water Endocrine: No thryomegaly Cardiac: Normal S1, S2; RRR; no murmurs, rubs, or gallops Lungs: Crackles at the lung bases Abd: Soft, nontender, no hepatomegaly  Ext: No edema, pulses 2+ Musculoskeletal: No deformities, BUE and BLE strength normal and equal Skin: Warm and dry, no rashes   Neuro: Alert, drowsy, oriented to person only  Labs  High Sensitivity Troponin:   Recent Labs  Lab 12/27/20 0640 12/27/20 0835  TROPONINIHS 397* 376*     Cardiac EnzymesNo results for input(s): TROPONINI in the last 168 hours. No results for input(s): TROPIPOC in the last 168 hours.  Chemistry Recent Labs  Lab 12/29/20 0332 12/30/20 0353 12/31/20 0225  NA 126* 127* 126*  K 3.7 3.9 4.4  CL 84* 85* 84*  CO2 '26 25 22  '$ GLUCOSE  160* 118* 130*  BUN 49* 58* 64*  CREATININE 4.28* 4.92* 5.56*  CALCIUM 8.2* 8.0* 7.8*  GFRNONAA 11* 9* 8*  ANIONGAP 16* 17* 20*    Hematology Recent Labs  Lab 12/29/20 0332 12/30/20 0353 12/31/20 0225  WBC 9.0 8.5 7.3  RBC 3.30* 3.04* 3.05*  HGB 9.0* 8.3* 8.2*  HCT 27.6* 25.9* 26.6*  MCV 83.6 85.2 87.2  MCH 27.3 27.3 26.9  MCHC 32.6 32.0 30.8  RDW 14.4 14.4 14.7  PLT 296 316 338   BNP Recent Labs  Lab 12/27/20 0835  BNP 1,102.1*    DDimer No results for input(s): DDIMER in the last 168 hours.   Radiology  DG Retrograde Pyelogram  Result Date: 12/31/2020 CLINICAL DATA:  Ureteral calculus EXAM: RETROGRADE PYELOGRAM COMPARISON:  CT 12/29/2020 FINDINGS: Single fluoroscopic spot image documents partial opacification of the renal collecting system and presence of ureteral stent. Laterality is not marked. IMPRESSION: Ureteral stent placement Electronically Signed   By: Lucrezia Europe M.D.   On: 12/31/2020 09:56   DG C-Arm 1-60 Min-No Report  Result Date: 12/30/2020 Fluoroscopy was utilized by the requesting physician.  No radiographic interpretation.   CT RENAL STONE STUDY  Result Date: 12/29/2020 CLINICAL DATA:  Hydronephrosis. EXAM: CT ABDOMEN AND PELVIS WITHOUT CONTRAST TECHNIQUE: Multidetector CT imaging of the abdomen and pelvis was performed following the standard protocol without IV contrast. COMPARISON:  Ultrasound renal 12/29/2020. CT abdomen and pelvis 06/21/2015. FINDINGS: Lower chest: There are patchy ground-glass and atelectatic changes in both lung bases, right greater than left. There also airspace opacities in the right lower lobe. Heart is enlarged. Hepatobiliary: No focal liver abnormality is seen. Status post cholecystectomy. No biliary dilatation. Pancreas: Unremarkable. No pancreatic ductal dilatation or surrounding inflammatory changes. Spleen: Normal in size without focal abnormality. Adrenals/Urinary Tract: There is mild left-sided hydroureteronephrosis. There  is a 6 mm calculus in the distal left ureter best seen on coronal image 6/53. There is a single punctate left renal calculus. No bladder calculi are visualized. The right kidney and adrenal glands are within normal limits. Stomach/Bowel: Stomach is within normal limits. Appendix is not visualized. No dilated bowel loops are seen. No bowel obstruction. There is mild inflammatory stranding surrounding the gastric antrum and duodenum. Vascular/Lymphatic: Aortic atherosclerosis. No enlarged abdominal or pelvic lymph nodes. Reproductive: Uterus and bilateral adnexa are unremarkable. Other: There is a small amount of free fluid in the right lower quadrant. There is no free air. There is mild diffuse body wall edema. There is no focal body wall hernia. Musculoskeletal:  No fracture is seen. IMPRESSION: 1. 6 mm calculus in the distal left ureter with mild obstructive uropathy. 2. Additional nonobstructing left renal calculus. 3. Inflammatory stranding surrounding the gastric antrum and duodenum may be related to gastritis/duodenitis. Ulcer disease is a consideration. 4. Small amount of free fluid in the right lower quadrant. 5. Gro patchy ground-glass and airspace opacities in the lung bases, right greater than left, worrisome for infection and or edema. 6.  Aortic Atherosclerosis (ICD10-I70.0). Und-glass and Electronically Signed   By: Ronney Asters M.D.   On: 12/29/2020 18:40   DG HIP UNILAT WITH PELVIS 2-3 VIEWS LEFT  Result Date: 12/29/2020 CLINICAL DATA:  Status post fall EXAM: DG HIP (WITH OR WITHOUT PELVIS) 2-3V LEFT COMPARISON:  CT renal 12/29/2020 FINDINGS: There is no evidence of hip fracture or dislocation. There is no evidence of arthropathy or other focal bone abnormality. IMPRESSION: No acute displaced fracture or dislocation with better evaluation of the left hip on CT renal 12/29/2020. Electronically Signed   By: Iven Finn M.D.   On: 12/29/2020 20:21    Cardiac Studies  TTE 12/28/2020  1.  Compared with the echo [id number], systolic function is reduced. Left  ventricular ejection fraction, by estimation, is 40 to 45%. The left  ventricle has mildly decreased function. The left ventricle demonstrates  global hypokinesis. Left ventricular  diastolic parameters are indeterminate.   2. Right ventricular systolic function is normal. The right ventricular  size is normal. There is moderately elevated pulmonary artery systolic  pressure.   3. The pericardial effusion is circumferential.   4. The mitral valve is normal in structure. Mild mitral valve  regurgitation. No evidence of mitral stenosis.   5. The aortic valve is tricuspid. There is mild calcification of the  aortic valve. There is mild thickening of the aortic valve. Aortic valve  regurgitation is not visualized. No aortic stenosis is present.   6. The inferior vena cava is dilated in size with <50% respiratory  variability, suggesting right atrial pressure of 15 mmHg.   Patient Profile  70 year old female with history of CKD stage IV, diastolic heart failure, PAD (bilateral SFA CTO's), left renal artery stenosis (90%), diabetes, CAD who was admitted on 12/27/2020 with chest pain and acute hypoxic respiratory failure secondary to pulmonary edema from likely hypertensive crisis.  Course complicated by A. fib with RVR 12/28/2020.   Assessment & Plan   #A. fib with RVR -Very difficult to control rates were developed on 12/28/2020. -Converted back to sinus rhythm on amiodarone.  Was in A. fib for less than 48 hours. -She was anticoagulated for a brief period but has nosebleeds and drop in her hemoglobin. -We have held anticoagulation at this time.  There is concern for gastritis on CT scan.  I have reached out to GI to evaluate her. -Hold anticoagulation for now. -Continue with oral amiodarone load, 400 mg twice daily for 7 days and 200 mg daily for 21.  Hopefully this will be short-term therapy to get her through this and her  critical illness.  #Acute blood loss anemia #Nosebleeds #Gastritis -Hold anticoagulation. -GI evaluation today.  #Acute hypoxic respiratory failure #CKD stage IV #Recurrent flash pulmonary edema #Systolic heart failure [id number] #Worsening renal failure/volume overload/hyponatremia/obstructive nephropathy #Left renal artery stenosis -She was admitted with decompensated heart failure in setting of flash pulmonary edema in the setting of hypertensive crisis. -Has had recurrent picture of this.  Known proximal left renal artery stenosis of 80%.  This is likely contributing.  We discussed possibly stenting this however her condition has declined. -Found to have left ureteral stone with obstructive uropathy.  Status post ureteral stent placement yesterday. -Her kidney function continues to decline. -Remains grossly volume overloaded and I what I suspect is sepsis and worsening confusion.  She has been given narcotics now Narcan. -I think her confusion is likely multifactorial in the setting of renal failure and possible infection from recent ureteral stent placement.  She does need hemodialysis and hopefully will start today. -For now would continue with current medical therapy.  Should her blood pressure become low you can stop these.  Her cardiomyopathy has developed with an EF of 40-45% which I suspect is related to an arrhythmia her A. fib was very rapid.  Blood pressure was also high.  Troponins were elevated but not planning for any ischemia evaluation given poor kidney function. -For now we will continue with medical therapy.  I do suspect she may need stenting of her left renal artery but if she was on dialysis this may not be needed.  We will see how she does.  She remains critically ill and is quite confused this morning.  #Confusion #UTI #Uremia #Hyponatremia -Likely all of the above explain her mental status.  We will continue with supportive care for now.  #Elevated  troponin #Demand ischemia #Coronary calcification -Troponin elevation is minimal and flat.  Suspect this is demand.  Likely will benefit from ischemia evaluation at some point but now is not the time.  For now we will continue with supportive care.  For questions or updates, please contact [place] Please consult [email or web address] for contact info under   Time Spent with Patient: I have spent a total of 35 minutes with patient reviewing hospital notes, telemetry, EKGs, labs and examining the patient as well as establishing an assessment and plan that was discussed with the patient.  > 50% of time was spent in direct patient care.    Signed, Addison Naegeli. Audie Box, MD, [place]  12/31/2020 10:54 AM

## 2020-12-31 NOTE — Progress Notes (Signed)
[facility] Progress Note    Assessment/ Plan:   [name] is a 31yoF with a PMHx of HTN, left renal artery stenosis, HFpEF, CKD 3/4, CAD, PAD and diabetes who presented with chest pain, acute hypoxemic respiratory failure and hypertensive emergency.    AKI on CKD 3-4- Likely secondary to hemodynamic insults from HTN crisis, renal artery stenosis, afib w/ RVR worsened in the setting of a mildly obstructive left ureteral stone. Now s/p left ureteral stent placement. Baseline Cr ~2.6, though recently discharged with a Cr of 3.4. Cr continues to worsen, 5.56 today and patient remains drowsy, BUN 64. Plan for line placement with IR today followed by HD.  - Consulted IR for line placement, appreciate asssistance - Plan for HD once access placed  - Avoid RAAS blockers or MRA's - Trend BMP, renal dose medications for GFR<15 - Monitor Daily I/Os, Daily weight  - Maintain MAP>65 for optimal renal perfusion.  - Continue to hold ACE-I, avoid further nephrotoxins including NSAIDS, Morphine. Unless absolutely necessary, avoid CT with contrast and/or MRI with gadolinium.   Microscopic hematuria- in the setting of mildly obstructing 6 mm distal left ureter renal stone seen on CT urogram. S/p left ureteral stent on 9/15. ANCA and lupus serologies wnl.   Left hip pain- s/p fall in the hospital. CT left hip ordered. Patient on dilaudid for pain, required narcan this morning due to being unresponsive. Avoid morphine.  Epistaxis- overnight 9/14, since resolved. Thought to be due to nasal canula. Heparin on hold, continue to monitor.   Left renal artery stenosis- Hx of 80-90% proximal left renal artery stenosis. -as resp status and Afib improve, considering CO2 and stent of left renal artery however on hold given the fact that there is some contrast given   AHRF, acute on chronic dCHF exacerbation- likely flash pulmonary edema. Holding lasix in the setting of AKI. Remains on supplemental  oxygen. Minimal urine output. Plan for HD today.    Hypertension- BP well controlled on norvasc, metoprolol, and hydralazine   Hyponatremia- Stable. Likely worsened in the setting of lasix and AKI.  -Continue fluid restriction 1.5L   Afib w/ RVR-on amio. Hep gtt on hold given epistaxis, which has resolved.   Anemia due to chronic kidney disease-Hgb stable at 8.2. Hep gtt on hold given nosebleeds -Transfuse for Hgb<7 g/dL   Diabetes Mellitus Type 2 with Hyperglycemia- -per primary    Subjective:    Patient continues to be drowsy on exam. Wakes and answers questions appropriately.   Objective:   BP 131/89 (BP Location: Right Arm)   Pulse 87   Temp (!) 97.4 F (36.3 C) (Oral)   Resp 18   Ht '5\' 2"'$  (1.575 m)   Wt 70.9 kg   SpO2 95%   BMI 28.59 kg/m   Intake/Output Summary (Last 24 hours) at 12/31/2020 0935 Last data filed at 12/31/2020 0502 Gross per 24 hour  Intake 1150 ml  Output 101 ml  Net 1049 ml   Weight change: 3.9 kg  Physical Exam: Gen:NAD, laying flat in bed, drowsy  CVS:RRR, no murmurs Resp:crackles bibasilar, normal WOB, speaking in full sentences [id number], nontender Ext:no edema Neuro: awake, alert, speech clear and coherent  Imaging: US RENAL  Result Date: 12/29/2020 CLINICAL DATA:  Hematuria. EXAM: RENAL / URINARY TRACT ULTRASOUND COMPLETE COMPARISON:  November 27, 2020. FINDINGS: Right Kidney: Renal measurements: 10.7 x 5.4 x 5.0 cm = volume: 150 mL. Echogenicity within normal limits. No mass or hydronephrosis visualized. Left Kidney:  Renal measurements: 10.3 x 4.9 x 4.5 cm = volume: 112 mL. Echogenicity within normal limits. No mass visualized. Mild left hydronephrosis is noted. Bladder: Appears normal for degree of bladder distention. Ureteral jets were not visualized. Other: None. IMPRESSION: Mild left hydronephrosis. CT urogram may be performed to evaluate for possible cause of obstruction. Electronically Signed   By: Marijo Conception M.D.   On: 12/29/2020  10:23   DG C-Arm 1-60 Min-No Report  Result Date: 12/30/2020 Fluoroscopy was utilized by the requesting physician.  No radiographic interpretation.   CT RENAL STONE STUDY  Result Date: 12/29/2020 CLINICAL DATA:  Hydronephrosis. EXAM: CT ABDOMEN AND PELVIS WITHOUT CONTRAST TECHNIQUE: Multidetector CT imaging of the abdomen and pelvis was performed following the standard protocol without IV contrast. COMPARISON:  Ultrasound renal 12/29/2020. CT abdomen and pelvis 06/21/2015. FINDINGS: Lower chest: There are patchy ground-glass and atelectatic changes in both lung bases, right greater than left. There also airspace opacities in the right lower lobe. Heart is enlarged. Hepatobiliary: No focal liver abnormality is seen. Status post cholecystectomy. No biliary dilatation. Pancreas: Unremarkable. No pancreatic ductal dilatation or surrounding inflammatory changes. Spleen: Normal in size without focal abnormality. Adrenals/Urinary Tract: There is mild left-sided hydroureteronephrosis. There is a 6 mm calculus in the distal left ureter best seen on coronal image 6/53. There is a single punctate left renal calculus. No bladder calculi are visualized. The right kidney and adrenal glands are within normal limits. Stomach/Bowel: Stomach is within normal limits. Appendix is not visualized. No dilated bowel loops are seen. No bowel obstruction. There is mild inflammatory stranding surrounding the gastric antrum and duodenum. Vascular/Lymphatic: Aortic atherosclerosis. No enlarged abdominal or pelvic lymph nodes. Reproductive: Uterus and bilateral adnexa are unremarkable. Other: There is a small amount of free fluid in the right lower quadrant. There is no free air. There is mild diffuse body wall edema. There is no focal body wall hernia. Musculoskeletal: No fracture is seen. IMPRESSION: 1. 6 mm calculus in the distal left ureter with mild obstructive uropathy. 2. Additional nonobstructing left renal calculus. 3.  Inflammatory stranding surrounding the gastric antrum and duodenum may be related to gastritis/duodenitis. Ulcer disease is a consideration. 4. Small amount of free fluid in the right lower quadrant. 5. Gro patchy ground-glass and airspace opacities in the lung bases, right greater than left, worrisome for infection and or edema. 6.  Aortic Atherosclerosis (ICD10-I70.0). Und-glass and Electronically Signed   By: Ronney Asters M.D.   On: 12/29/2020 18:40   DG HIP UNILAT WITH PELVIS 2-3 VIEWS LEFT  Result Date: 12/29/2020 CLINICAL DATA:  Status post fall EXAM: DG HIP (WITH OR WITHOUT PELVIS) 2-3V LEFT COMPARISON:  CT renal 12/29/2020 FINDINGS: There is no evidence of hip fracture or dislocation. There is no evidence of arthropathy or other focal bone abnormality. IMPRESSION: No acute displaced fracture or dislocation with better evaluation of the left hip on CT renal 12/29/2020. Electronically Signed   By: Iven Finn M.D.   On: 12/29/2020 20:21    Labs: BMET Recent Labs  Lab 12/27/20 0640 12/27/20 0847 12/28/20 0336 12/29/20 0332 12/30/20 0353 12/31/20 0225  NA 131* 130* 129* 126* 127* 126*  K 3.8 3.8 3.5 3.7 3.9 4.4  CL 91*  --  87* 84* 85* 84*  CO2 26  --  '31 26 25 22  '$ GLUCOSE 166*  --  177* 160* 118* 130*  BUN 36*  --  44* 49* 58* 64*  CREATININE 3.53*  --  3.79* 4.28* 4.92*  5.56*  CALCIUM 9.7  --  8.7* 8.2* 8.0* 7.8*   CBC Recent Labs  Lab 12/27/20 0640 12/27/20 0847 12/29/20 0332 12/30/20 0353 12/31/20 0225  WBC 8.8  --  9.0 8.5 7.3  HGB 11.4* 12.9 9.0* 8.3* 8.2*  HCT 35.9* 38.0 27.6* 25.9* 26.6*  MCV 87.3  --  83.6 85.2 87.2  PLT 369  --  296 316 338    Medications:     amiodarone  400 mg Oral BID   Followed by   Derrill Memo ON 01/05/2021] amiodarone  200 mg Oral Daily   amLODipine  10 mg Oral Daily   Chlorhexidine Gluconate Cloth  6 each Topical Q0600   cilostazol  50 mg Oral BID   ezetimibe  10 mg Oral Daily   famotidine  20 mg Oral Daily   hydrALAZINE  25 mg  Oral Q8H   insulin aspart  0-6 Units Subcutaneous TID WC   insulin aspart protamine- aspart  8 Units Subcutaneous Q breakfast   isosorbide mononitrate  30 mg Oral Daily   metoprolol succinate  50 mg Oral Daily   pantoprazole (PROTONIX) IV  40 mg Intravenous Daily   rosuvastatin  40 mg Oral Daily   sodium chloride flush  3 mL Intravenous Q12H     Alveta Quintela, DO PGY-3 IMT[facility]  12/31/2020, 9:35 AM

## 2020-12-31 NOTE — Care Management Important Message (Signed)
Important Message  Patient Details  Name: Molly [name] MRN: [id number] Date of Birth: 10-20-1950   Medicare Important Message Given:  Yes     [name] 12/31/2020, 3:03 PM

## 2020-12-31 NOTE — Progress Notes (Signed)
PROGRESS NOTE    JAMAI FEST  [id number] DOB: 1950/07/13 DOA: 12/27/2020 PCP: [name], MD   Brief Narrative:  70 year old female with past medical history of hypertension, left renal artery stenosis ([id number]) diastolic CHF, CKD stage IV, CAD, PAD with bilateral SFA CTO's, anemia chronic disease diabetes presented on 12/27/2020 with 4 days of chest pain.  BP was uncontrolled and spiked as high as 234/208 in the ED.  Chest x-ray consistent with pulmonary edema.  Patient briefly required BiPAP.   Admitted to [facility] service with cardiology consulted. Patient developed A. fib with RVR on 9/13.  Assessment & Plan:   Principal Problem:   Acute on chronic respiratory failure with hypoxia and hypercapnia (HCC) Active Problems:   HTN (hypertension)   Type 2 diabetes mellitus with stage 3b chronic kidney disease, with long-term current use of insulin (HCC)   Elevated troponin   Acute on chronic heart failure with preserved ejection fraction (HFpEF) (HCC)   CAP (community acquired pneumonia)   Anemia of chronic disease  Acute metabolic encephalopathy: Patient this morning is very groggy.  Likely secondary to uremia and partly secondary to Dilaudid that she received at 1:30 AM.  Patient was given 1 dose of naloxone per my orders this morning and after that, patient was slightly more awake but confused.  Hopefully dialysis will help improve her mentation.  Acute respiratory failure with hypoxia secondary to recurrent flash pulmonary edema/acute systolic congestive heart failure: Echo shows reduced ejection fraction to 40 to 45% compared to the previous 1.  Cardiology on board and managing.  Due to rising creatinine, she is on diuretics holiday.  No plans for diuretics per cardiology.  Plan for hemodialysis today.  Fall in hospital/left hip pain: Patient had a fall in the hospital on the evening of 12/29/2020.  Continues to complain of left hip pain.  X-ray left hip negative for fracture.   She was complaining of pain yesterday.  CT left hip was ordered yesterday and it has been almost 28 hours and the exam is just completed but not read by radiology yet.  Hypertensive emergency with chest pain: No chest pain and blood pressure controlled now.  Continue amlodipine 10 mg p.o. daily, hydralazine 25 mg p.o. 3 times daily Imdur to 30 mg p.o. daily, Toprol-XL 50 mg p.o. daily.  Left renal artery stenosis: Has a history of 80 to 90% of proximal left renal artery stenosis.  Plan was to do angiogram and stent but due to rising creatinine, this plan is on hold for now.  Cardiology and nephrology managing.  New onset A. fib with RVR: Developed overnight 12/28/2020.  She was initially started on amiodarone drip and converted back to sinus rhythm.  Transitioned to amiodarone 400 mg p.o. twice daily for 7 days followed by 200 mg daily for 21 days.  Per cardiology, it is not going to be long-term but instead only for 4 weeks.  Patient's heparin was stopped this morning due to epistaxis and drop in hemoglobin.  Epistaxis: Nosebleed overnight 12/30/2018, no further episodes.  Heparin was stopped and has remained on hold since then.  AKI on CKD stage IV/progressive renal failure/obstructive uropathy/left ureteral stone/left hydronephrosis: CT renal stone yesterday shows 6 mm of left ureteral stone causing obstruction/hydronephrosis.  Patient underwen cystoscopy with left ureteral stent placement but despite of this, she has had poor urine output with rising creatinine and now she is uremic as well.  Plan for dialysis by nephrology today.   Hyponatremia: 126, likely due  to hemodilution.  Hopefully this will improve with fixing the main issue of urinary obstruction.  Community-acquired pneumonia: Continue Rocephin for total 5 days, last dose today.  Elevated troponin/demand ischemia: In the setting of hypertensive crisis.  EKG without ischemic changes.  Likely demand ischemia. 397 > 376.  Cardiology on  board.  No plan for intervention.  Type 2 diabetes mellitus: Recent hemoglobin A1c 8.2% on 11/27/2020.  Home regimen includes Humulin 70/30 mix 8 to 12 units daily.  Currently on 8 units here and SSI.  Blood sugar controlled.  Continue current regimen.  Dyslipidemia: Continue Zetia and Crestor.  PAD: Continue statin and Pletal.  Anemia of chronic disease: Hemoglobin has remained stable since yesterday around 8.2.  CT renal stone indicates possible gastritis or gastric ulcer.  Cardiology has consulted GI.  Continue Protonix.  GERD: Continue PPI.  DVT prophylaxis:    Code Status: Full Code  Family Communication: Patient's husband present at bedside.  Plan of care discussed with him in length.    Status is: Inpatient  Remains inpatient appropriate because:Ongoing diagnostic testing needed not appropriate for outpatient work up  Dispo: The patient is from: Home              Anticipated d/c is to: Home              Patient currently is not medically stable to d/c.   Difficult to place patient No        Estimated body mass index is 28.59 kg/m as calculated from the following:   Height as of this encounter: '5\' 2"'$  (1.575 m).   Weight as of this encounter: 70.9 kg.     Nutritional Assessment: Body mass index is 28.59 kg/m.Marland Kitchen Seen by dietician.  I agree with the assessment and plan as outlined below: Nutrition Status:       Skin Assessment: I have examined the patient's skin and I agree with the wound assessment as performed by the wound care RN as outlined below:    Consultants:  Nephrology Cardiology  Procedures:  None  Antimicrobials:  Anti-infectives (From admission, onward)    Start     Dose/Rate Route Frequency Ordered Stop   12/31/20 1000  cefTRIAXone (ROCEPHIN) 1 g in sodium chloride 0.9 % 100 mL IVPB        1 g 200 mL/hr over 30 Minutes Intravenous Every 24 hours 12/30/20 1202     12/27/20 1530  cefTRIAXone (ROCEPHIN) 2 g in sodium chloride 0.9 % 100 mL  IVPB  Status:  Discontinued        2 g 200 mL/hr over 30 Minutes Intravenous Every 24 hours 12/27/20 1435 12/30/20 1202          Subjective: Patient seen and examined.  Husband at the bedside.  Patient very lethargic/groggy and confused.  Objective: Vitals:   12/31/20 0907 12/31/20 0958 12/31/20 1133 12/31/20 1305  BP: 131/89 (!) 123/58 (!) 119/53 (!) 113/48  Pulse: 87 72 71 70  Resp: '18 16 18 20  '$ Temp: (!) 97.4 F (36.3 C) 97.6 F (36.4 C) 97.7 F (36.5 C) 97.7 F (36.5 C)  TempSrc: Oral Oral Oral Oral  SpO2: 95% 100% 99% 98%  Weight:      Height:        Intake/Output Summary (Last 24 hours) at 12/31/2020 1315 Last data filed at 12/31/2020 1300 Gross per 24 hour  Intake 1150 ml  Output 101 ml  Net 1049 ml    Filed Weights   12/29/20  [id number] 12/30/20 0223 12/31/20 0458  Weight: 67 kg 67 kg 70.9 kg    Examination:  General exam: Appears lethargic and confused. Respiratory system: Rhonchi at bases bilaterally.  Respiratory effort normal. Cardiovascular system: S1 & S2 heard, RRR. No JVD, murmurs, rubs, gallops or clicks. No pedal edema. Gastrointestinal system: Abdomen is nondistended, soft and nontender. No organomegaly or masses felt. Normal bowel sounds heard. Central nervous system: Lethargic and disoriented.  No focal deficit. Skin: No rashes, lesions or ulcers.    Data Reviewed: I have personally reviewed following labs and imaging studies  CBC: Recent Labs  Lab 12/27/20 0640 12/27/20 0847 12/29/20 0332 12/30/20 0353 12/31/20 0225  WBC 8.8  --  9.0 8.5 7.3  HGB 11.4* 12.9 9.0* 8.3* 8.2*  HCT 35.9* 38.0 27.6* 25.9* 26.6*  MCV 87.3  --  83.6 85.2 87.2  PLT 369  --  296 316 [id number]    Basic Metabolic Panel: Recent Labs  Lab 12/27/20 0640 12/27/20 0847 12/28/20 0336 12/29/20 0332 12/30/20 0353 12/31/20 0225  NA 131* 130* 129* 126* 127* 126*  K 3.8 3.8 3.5 3.7 3.9 4.4  CL 91*  --  87* 84* 85* 84*  CO2 26  --  '31 26 25 22  '$ GLUCOSE 166*  --  177*  160* 118* 130*  BUN 36*  --  44* 49* 58* 64*  CREATININE 3.53*  --  3.79* 4.28* 4.92* 5.56*  CALCIUM 9.7  --  8.7* 8.2* 8.0* 7.8*  MG  --   --   --  2.1  --   --     GFR: Estimated Creatinine Clearance: 8.9 mL/min (A) (by C-G formula based on SCr of 5.56 mg/dL (H)). Liver Function Tests: No results for input(s): AST, ALT, ALKPHOS, BILITOT, PROT, ALBUMIN in the last 168 hours. No results for input(s): LIPASE, AMYLASE in the last 168 hours. No results for input(s): AMMONIA in the last 168 hours. Coagulation Profile: No results for input(s): INR, PROTIME in the last 168 hours. Cardiac Enzymes: No results for input(s): CKTOTAL, CKMB, CKMBINDEX, TROPONINI in the last 168 hours. BNP (last 3 results) No results for input(s): PROBNP in the last 8760 hours. HbA1C: No results for input(s): HGBA1C in the last 72 hours. CBG: Recent Labs  Lab 12/30/20 1621 12/30/20 2135 12/31/20 0545 12/31/20 0910 12/31/20 1131  GLUCAP 93 126* 150* 151* 172*    Lipid Profile: No results for input(s): CHOL, HDL, LDLCALC, TRIG, CHOLHDL, LDLDIRECT in the last 72 hours. Thyroid Function Tests: No results for input(s): TSH, T4TOTAL, FREET4, T3FREE, THYROIDAB in the last 72 hours. Anemia Panel: No results for input(s): VITAMINB12, FOLATE, FERRITIN, TIBC, IRON, RETICCTPCT in the last 72 hours. Sepsis Labs: Recent Labs  Lab 12/27/20 1130  PROCALCITON 0.39     Recent Results (from the past 240 hour(s))  Resp Panel by RT-PCR (Flu A&B, Covid) Nasopharyngeal Swab     Status: None   Collection Time: 12/27/20  8:53 AM   Specimen: Nasopharyngeal Swab; Nasopharyngeal(NP) swabs in vial transport medium  Result Value Ref Range Status   SARS Coronavirus 2 by RT PCR NEGATIVE NEGATIVE Final    Comment: (NOTE) SARS-CoV-2 target nucleic acids are NOT DETECTED.  The SARS-CoV-2 RNA is generally detectable in upper respiratory specimens during the acute phase of infection. The lowest concentration of SARS-CoV-2  viral copies this assay can detect is 138 copies/mL. A negative result does not preclude SARS-Cov-2 infection and should not be used as the sole basis for treatment or other patient  management decisions. A negative result may occur with  improper specimen collection/handling, submission of specimen other than nasopharyngeal swab, presence of viral mutation(s) within the areas targeted by this assay, and inadequate number of viral copies(<138 copies/mL). A negative result must be combined with clinical observations, patient history, and epidemiological information. The expected result is Negative.  Fact Sheet for Patients:  EntrepreneurPulse.com.au  Fact Sheet for Healthcare Providers:  IncredibleEmployment.be  This test is no t yet approved or cleared by the [place] FDA and  has been authorized for detection and/or diagnosis of SARS-CoV-2 by FDA under an Emergency Use Authorization (EUA). This EUA will remain  in effect (meaning this test can be used) for the duration of the COVID-19 declaration under Section 564(b)(1) of the Act, 21 U.S.C.section 360bbb-3(b)(1), unless the authorization is terminated  or revoked sooner.       Influenza A by PCR NEGATIVE NEGATIVE Final   Influenza B by PCR NEGATIVE NEGATIVE Final    Comment: (NOTE) The Xpert Xpress SARS-CoV-2/FLU/RSV plus assay is intended as an aid in the diagnosis of influenza from Nasopharyngeal swab specimens and should not be used as a sole basis for treatment. Nasal washings and aspirates are unacceptable for Xpert Xpress SARS-CoV-2/FLU/RSV testing.  Fact Sheet for Patients: EntrepreneurPulse.com.au  Fact Sheet for Healthcare Providers: IncredibleEmployment.be  This test is not yet approved or cleared by the [place] FDA and has been authorized for detection and/or diagnosis of SARS-CoV-2 by FDA under an Emergency Use Authorization  (EUA). This EUA will remain in effect (meaning this test can be used) for the duration of the COVID-19 declaration under Section 564(b)(1) of the Act, 21 U.S.C. section 360bbb-3(b)(1), unless the authorization is terminated or revoked.  Performed at [facility], [place] [address], [place], [place] [address]        Radiology Studies: DG Retrograde Pyelogram  Result Date: 12/31/2020 CLINICAL DATA:  Ureteral calculus EXAM: RETROGRADE PYELOGRAM COMPARISON:  CT 12/29/2020 FINDINGS: Single fluoroscopic spot image documents partial opacification of the renal collecting system and presence of ureteral stent. Laterality is not marked. IMPRESSION: Ureteral stent placement Electronically Signed   By: Lucrezia Europe M.D.   On: 12/31/2020 09:56   DG C-Arm 1-60 Min-No Report  Result Date: 12/30/2020 Fluoroscopy was utilized by the requesting physician.  No radiographic interpretation.   CT RENAL STONE STUDY  Result Date: 12/29/2020 CLINICAL DATA:  Hydronephrosis. EXAM: CT ABDOMEN AND PELVIS WITHOUT CONTRAST TECHNIQUE: Multidetector CT imaging of the abdomen and pelvis was performed following the standard protocol without IV contrast. COMPARISON:  Ultrasound renal 12/29/2020. CT abdomen and pelvis 06/21/2015. FINDINGS: Lower chest: There are patchy ground-glass and atelectatic changes in both lung bases, right greater than left. There also airspace opacities in the right lower lobe. Heart is enlarged. Hepatobiliary: No focal liver abnormality is seen. Status post cholecystectomy. No biliary dilatation. Pancreas: Unremarkable. No pancreatic ductal dilatation or surrounding inflammatory changes. Spleen: Normal in size without focal abnormality. Adrenals/Urinary Tract: There is mild left-sided hydroureteronephrosis. There is a 6 mm calculus in the distal left ureter best seen on coronal image 6/53. There is a single punctate left renal calculus. No bladder calculi are visualized. The right kidney and adrenal  glands are within normal limits. Stomach/Bowel: Stomach is within normal limits. Appendix is not visualized. No dilated bowel loops are seen. No bowel obstruction. There is mild inflammatory stranding surrounding the gastric antrum and duodenum. Vascular/Lymphatic: Aortic atherosclerosis. No enlarged abdominal or pelvic lymph nodes. Reproductive: Uterus and bilateral adnexa  are unremarkable. Other: There is a small amount of free fluid in the right lower quadrant. There is no free air. There is mild diffuse body wall edema. There is no focal body wall hernia. Musculoskeletal: No fracture is seen. IMPRESSION: 1. 6 mm calculus in the distal left ureter with mild obstructive uropathy. 2. Additional nonobstructing left renal calculus. 3. Inflammatory stranding surrounding the gastric antrum and duodenum may be related to gastritis/duodenitis. Ulcer disease is a consideration. 4. Small amount of free fluid in the right lower quadrant. 5. Gro patchy ground-glass and airspace opacities in the lung bases, right greater than left, worrisome for infection and or edema. 6.  Aortic Atherosclerosis (ICD10-I70.0). Und-glass and Electronically Signed   By: Ronney Asters M.D.   On: 12/29/2020 18:40   DG HIP UNILAT WITH PELVIS 2-3 VIEWS LEFT  Result Date: 12/29/2020 CLINICAL DATA:  Status post fall EXAM: DG HIP (WITH OR WITHOUT PELVIS) 2-3V LEFT COMPARISON:  CT renal 12/29/2020 FINDINGS: There is no evidence of hip fracture or dislocation. There is no evidence of arthropathy or other focal bone abnormality. IMPRESSION: No acute displaced fracture or dislocation with better evaluation of the left hip on CT renal 12/29/2020. Electronically Signed   By: Iven Finn M.D.   On: 12/29/2020 20:21    Scheduled Meds:  amiodarone  400 mg Oral BID   Followed by   Derrill Memo ON 01/05/2021] amiodarone  200 mg Oral Daily   amLODipine  10 mg Oral Daily   Chlorhexidine Gluconate Cloth  6 each Topical Q0600   cilostazol  50 mg Oral BID    ezetimibe  10 mg Oral Daily   famotidine  20 mg Oral Daily   hydrALAZINE  25 mg Oral Q8H   insulin aspart  0-6 Units Subcutaneous TID WC   insulin aspart protamine- aspart  8 Units Subcutaneous Q breakfast   isosorbide mononitrate  30 mg Oral Daily   metoprolol succinate  50 mg Oral Daily   pantoprazole (PROTONIX) IV  40 mg Intravenous Daily   rosuvastatin  40 mg Oral Daily   sodium chloride flush  3 mL Intravenous Q12H   Continuous Infusions:  sodium chloride     sodium chloride     sodium chloride     cefTRIAXone (ROCEPHIN)  IV       LOS: 4 days   Time spent: 30 minutes   Darliss Cheney, MD [facility]  12/31/2020, 1:15 PM  Please page via [facility] and do not message via secure chat for anything urgent. Secure chat can be used for anything non urgent and I will respond at my earliest availability.  How to contact the [facility] Attending or Consulting provider [place] or covering provider during after hours [place], for this patient?  Check the care team in [facility] and look for a) attending/consulting [facility] provider listed and b) the [facility] team listed. Page or secure chat [phone]. Log into [email or web address] and use [place]'s universal password to access. If you do not have the password, please contact the hospital operator. Locate the [facility] provider you are looking for under [facility] and page to a number that you can be directly reached. If you still have difficulty reaching the provider, please page the [facility] (Director on Call) for the Hospitalists listed on amion for assistance.

## 2020-12-31 NOTE — Consult Note (Signed)
Referring Provider:  [facility]         Primary Care Physician:  [name], MD Primary Gastroenterologist:   Scarlette Shorts, MD  Reason for Consultation:    anemia               ASSESSMENT / PLAN    # 70 yo female with Baker anemia on anticoagulation ( started this admission) without overt GI blood loss. Baseline hgb 11-12, now down to 8.2. Patient drowsy, history comes from husband who says patient hasn't had any GI complaints at home. Non contrast CT scan  suggesting gastroduodenitis.  --She has had episodes of epistaxis while anticoagulated but unlikely nosebleeds have led to a 3 gram drop in hgb.She could be bleeding from erosive disease or AVMs but lack of overt GI bleeding is perplexing.  --She had a complete normal colonoscopy  in 2017 so she is up to date on colon cancer screening. Given CT scan results it is reasonable to pursue upper endoscopy . The risks and benefits of EGD with possible biopsies were discussed with the patient's husband who agrees to give consent. Procedure can likely be done tomorrow.  --NPO after MN --Continue QD protonix  # Acute on chronic respiratory failure secondary to recurrent flash pulmonary edema. EF 40-50%. Cardiology following. Not diuresing due to renal function.   # Fall in hospital with resulting left hip pain. No fracture on Xray. CT scan hip is pending.  # AKI on CKD in setting of HTN crisis, RAS, Afib w/ RVR, mildly obstructive left ureteral stone s/p stent placement. Nephrology following, creatinine continues to worsen.  --Plan is for HD  # AMS likely combination of uremia and pain meds  # ? Pancreatic insufficiency. Pancreatic elastase 104.    HISTORY OF PRESENT ILLNESS                                                                                                                         Chief Complaint:  anemia  Molly [name] is a 70 y.o. female with a past medical history significant for hypertension, diastolic heart  failure, CKD 4, PAD, renal artery stenosis, diabetes , anemia of chronic disease . See PMH for any additional medical history.   Patient admitted 9/12 with hypertensive emergency with chest pain / flash pulmonary edema, CAP. Marland Kitchen During this admission patient developed new A. fib with RVR, started on Cardizem drip and heparin cardiology is following.  Patient has been having nosebleeds . It was noted that she had a declining hemoglobin.  Heparin stopped. Non-contrast CTAP shows inflammatory stranding surrounding the gastric antrum and duodenum, small amount of fluid in the RLQ, groundglass and airspace opacities in both lung bases   SIGNIFICANT DIAGNOTIC STUDIES    Non-contrast CTAP  IMPRESSION: 1. 6 mm calculus in the distal left ureter with mild obstructive uropathy. 2. Additional nonobstructing left renal calculus. 3. Inflammatory stranding surrounding the gastric antrum and duodenum may be related to gastritis/duodenitis. Ulcer disease  is a consideration. 4. Small amount of free fluid in the right lower quadrant. 5. Gro patchy ground-glass and airspace opacities in the lung bases, right greater than left, worrisome for infection and or edema. 6.  Aortic Atherosclerosis (ICD10-I70.0). Und-glass and     PREVIOUS ENDOSCOPIC EVALUATIONS    September 2017 Colonoscopy [facility]  --Complete exam, good prep --Normal  Past Medical History:  Diagnosis Date   Abdominal aortic atherosclerosis with stenosis 06/29/2015   AKI (acute kidney injury) ([place]) 06/29/2015   Atherosclerosis of coronary artery without angina pectoris 04/21/2020   Atopic dermatitis 04/21/2020   Benign hypertension with CKD (chronic kidney disease) stage III ([place]) 04/21/2020   Bilateral impacted cerumen 08/21/2019   Chest pain 08/31/2020   Cholecystitis 06/29/2015   Cholelithiasis 06/29/2015   Claudication in peripheral vascular disease ([place]) 08/28/2019   Peripheral arterial disease   Colon cancer screening 04/21/2020   Dehydration  with hyponatremia 06/29/2015   Diabetes mellitus ([place]) 07/15/2018   Diarrhea 04/21/2020   DKA (diabetic ketoacidoses) 06/29/2015   Dyslipidemia 08/30/2018   Elevated troponin    Epigastric pain 04/21/2020   Gastroesophageal reflux disease 04/21/2020   HTN (hypertension) 06/29/2015   Hyperglycemia due to type 2 diabetes mellitus ([place]) 04/21/2020   Hypertension    Hypertensive emergency 07/12/2020   Hypertensive heart disease without congestive heart failure 04/21/2020   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   Proptosis 04/21/2020   Proximal leg weakness 09/10/2018   Ptosis of left eyelid 09/10/2018   Renal artery stenosis ([place]) 12/17/2019   Renal artery stenosis   Standard chest x-ray abnormal 04/21/2020   Vitamin D deficiency 04/21/2020   Weight loss 04/21/2020    Past Surgical History:  Procedure Laterality Date   ABDOMINAL AORTOGRAM W/LOWER EXTREMITY N/A 08/28/2019   Procedure: ABDOMINAL AORTOGRAM W/LOWER EXTREMITY;  Surgeon: Lorretta Harp, MD;  Location: [facility];  Service: Cardiovascular;  Laterality: N/A;   CATARACT EXTRACTION, BILATERAL     CHOLECYSTECTOMY N/A 06/30/2015   Procedure: LAPAROSCOPIC CHOLECYSTECTOMY WITH INTRAOPERATIVE CHOLANGIOGRAM;  Surgeon: Donnie Mesa, MD;  Location: [facility];  Service: General;  Laterality: N/A;   CYSTOSCOPY W/ URETERAL STENT PLACEMENT Left 12/30/2020   Procedure: CYSTOSCOPY WITH RETROGRADE PYELOGRAM/URETERAL STENT  PLACEMENT;  Surgeon: Janith Lima, MD;  Location: [facility];  Service: Urology;  Laterality: Left;   PERIPHERAL VASCULAR BALLOON ANGIOPLASTY Right 08/28/2019   Procedure: PERIPHERAL VASCULAR BALLOON ANGIOPLASTY;  Surgeon: Lorretta Harp, MD;  Location: [facility];  Service: Cardiovascular;  Laterality: Right;  attempted SFA    Prior to Admission medications   Medication Sig Start Date End Date Taking? Authorizing Provider  amLODipine (NORVASC) 10 MG tablet Take 1 tablet (10 mg total) by mouth daily. 12/03/20  Yes Domenic Polite, MD  Cholecalciferol 25 MCG (1000 UT) tablet Take 2,000 Units by mouth daily.   Yes [provider]  cilostazol (PLETAL) 50 MG tablet Take 1 tablet (50 mg total) by mouth 2 (two) times  daily. 03/15/20  Yes Lorretta Harp, MD  coconut oil OIL Apply 1 application topically as needed (itching).   Yes [provider]  ezetimibe (ZETIA) 10 MG tablet Take 1 tablet (10 mg total) by mouth daily. 10/28/20 07/25/21 Yes Park Liter, MD  famotidine (PEPCID) 20 MG tablet Take 20 mg by mouth in the morning and at bedtime.   Yes [provider]  furosemide (LASIX) 40 MG tablet Take 1 tablet (40 mg total) by mouth daily. 12/03/20  Yes Domenic Polite, MD  hydrALAZINE (APRESOLINE) 25 MG tablet Take 1 tablet (25 mg total) by mouth every 12 (twelve) hours. 10/28/20 07/25/21 Yes Park Liter, MD  hydrocortisone cream 1 % Apply 1 application topically 4 (four) times daily as needed for itching.   Yes [provider]  insulin isophane & regular human KwikPen (HUMULIN 70/30 MIX) (70-30) 100 UNIT/ML KwikPen Inject 8-12 Units into the skin daily. 12/03/20  Yes Domenic Polite, MD  ketoconazole (NIZORAL) 2 % cream Apply 1 application topically daily as needed for irritation.  05/08/19  Yes [provider]  levocetirizine (XYZAL) 5 MG tablet Take 5 mg by mouth daily as needed for allergies. 05/12/19  Yes [provider]   loperamide (IMODIUM A-D) 2 MG tablet Take 2 mg by mouth 4 (four) times daily as needed for diarrhea or loose stools.   Yes [provider]  Pediatric Multivitamins-Iron (FLINTSTONES COMPLETE) 18 MG CHEW Chew 1 tablet by mouth daily. Unknown strength   Yes [provider]  rosuvastatin (CRESTOR) 40 MG tablet TAKE 1 TABLET(40 MG) BY MOUTH AT BEDTIME Patient taking differently: Take 40 mg by mouth daily. 05/10/20  Yes Park Liter, MD  triamcinolone cream (KENALOG) 0.1 % Apply 1 application topically daily as needed (spots on legs).    Yes [provider]    Current Facility-Administered Medications  Medication Dose Route Frequency Provider Last Rate Last Admin   0.9 %  sodium chloride infusion  250 mL Intravenous PRN Norval Morton, MD   New Bag at 12/30/20 1600   0.9 %  sodium chloride infusion  100 mL Intravenous PRN Gean Quint, MD       0.9 %  sodium chloride infusion  100 mL Intravenous PRN Gean Quint, MD       acetaminophen (TYLENOL) tablet 650 mg  650 mg Oral Q4H PRN Fuller Plan A, MD   650 mg at 12/30/20 1911   alteplase (CATHFLO ACTIVASE) injection 2 mg  2 mg Intracatheter Once PRN Gean Quint, MD       amiodarone (PACERONE) tablet 400 mg  400 mg Oral BID Geralynn Rile, MD   400 mg at 12/30/20 2157   Followed by   Derrill Memo ON 01/05/2021] amiodarone (PACERONE) tablet 200 mg  200 mg Oral Daily O'Neal, Cassie Freer, MD       amLODipine (NORVASC) tablet 10 mg  10 mg Oral Daily Tamala Julian, Rondell A, MD   10 mg at 12/30/20 0911   cefTRIAXone (ROCEPHIN) 1 g in sodium chloride 0.9 % 100 mL IVPB  1 g Intravenous Q24H Pahwani, Einar Grad, MD       Chlorhexidine Gluconate Cloth 2 % PADS 6 each  6 each Topical Q0600 Gean Quint, MD       cilostazol (PLETAL) tablet 50 mg  50 mg Oral BID Fuller Plan A, MD   50 mg at 12/30/20 2156   ezetimibe (ZETIA) tablet 10 mg  10 mg Oral Daily Norval Morton, MD  10 mg at 12/30/20 0911   famotidine (PEPCID) tablet 20  mg  20 mg Oral Daily Einar Grad, RPH   20 mg at 12/30/20 0911   heparin injection 1,000 Units  1,000 Units Dialysis PRN Gean Quint, MD       hydrALAZINE (APRESOLINE) tablet 25 mg  25 mg Oral Q8H Geralynn Rile, MD   25 mg at 12/30/20 2157   hydrocortisone cream 1 % 1 application  1 application Topical QID PRN Fuller Plan A, MD       HYDROmorphone (DILAUDID) injection 0.5 mg  0.5 mg Intravenous Q4H PRN Darliss Cheney, MD   0.5 mg at 12/31/20 0124   insulin aspart (novoLOG) injection 0-6 Units  0-6 Units Subcutaneous TID WC Smith, Rondell A, MD   1 Units at 12/29/20 1202   insulin aspart protamine- aspart (NOVOLOG MIX 70/30) injection 8 Units  8 Units Subcutaneous Q breakfast Fuller Plan A, MD   8 Units at 12/29/20 1201   isosorbide mononitrate (IMDUR) 24 hr tablet 30 mg  30 mg Oral Daily Geralynn Rile, MD   30 mg at 12/30/20 0911   lidocaine (PF) (XYLOCAINE) 1 % injection 5 mL  5 mL Intradermal PRN Gean Quint, MD       lidocaine-prilocaine (EMLA) cream 1 application  1 application Topical PRN Gean Quint, MD       loperamide (IMODIUM) capsule 2 mg  2 mg Oral QID PRN Norval Morton, MD       loratadine (CLARITIN) tablet 5 mg  5 mg Oral Daily PRN Fuller Plan A, MD       metoprolol succinate (TOPROL-XL) 24 hr tablet 50 mg  50 mg Oral Daily O'Neal, Cassie Freer, MD   50 mg at 12/30/20 0911   naloxone [facility]) injection 0.4 mg  0.4 mg Intravenous PRN Darliss Cheney, MD   0.4 mg at 12/31/20 0859   pantoprazole (PROTONIX) injection 40 mg  40 mg Intravenous Daily Darliss Cheney, MD   40 mg at 12/30/20 1221   pentafluoroprop-tetrafluoroeth (GEBAUERS) aerosol 1 application  1 application Topical PRN Gean Quint, MD       rosuvastatin (CRESTOR) tablet 40 mg  40 mg Oral Daily Tamala Julian, Rondell A, MD   40 mg at 12/30/20 0911   sodium chloride (OCEAN) 0.65 % nasal spray 1 spray  1 spray Each Nare PRN Chotiner, Yevonne Aline, MD   1 spray at 12/29/20 0202   sodium chloride flush (NS)  0.9 % injection 3 mL  3 mL Intravenous Q12H Smith, Rondell A, MD   3 mL at 12/30/20 2216   sodium chloride flush (NS) 0.9 % injection 3 mL  3 mL Intravenous PRN Norval Morton, MD        Allergies as of 12/27/2020 - Review Complete 12/27/2020  Allergen Reaction Noted   Lisinopril Other (See Comments) 09/29/2020   Mercury Nausea And Vomiting 06/21/2015    Family History  Problem Relation Age of Onset   Breast cancer Mother    Hypertension Mother    Hypertension Father    Pancreatic cancer Other        uncle    Social History   Socioeconomic History   Marital status: Married    Spouse name: Starlena Pata   Number of children: 2   Years of education: Not on file   Highest education level: Bachelor's degree (e.g., BA, AB, BS)  Occupational History   Occupation: retired    Comment: Med Tech  Tobacco  Use   Smoking status: Never   Smokeless tobacco: Never  Vaping Use   Vaping Use: Never used  Substance and Sexual Activity   Alcohol use: No   Drug use: Never   Sexual activity: Not on file  Other Topics Concern   Not on file  Social History Narrative   Not on file   Social Determinants of Health   Financial Resource Strain: Low Risk    Difficulty of Paying Living Expenses: Not very hard  Food Insecurity: No Food Insecurity   Worried About Running Out of Food in the Last Year: Never true   Ran Out of Food in the Last Year: Never true  Transportation Needs: No Transportation Needs   Lack of Transportation (Medical): No   Lack of Transportation (Non-Medical): No  Physical Activity: Not on file  Stress: Not on file  Social Connections: Not on file  Intimate Partner Violence: Not on file    Review of Systems: All systems reviewed and negative except where noted in HPI.  OBJECTIVE    Physical Exam: Vital signs in last 24 hours: Temp:  [97 F (36.1 C)-98.7 F (37.1 C)] 97.7 F (36.5 C) (09/16 1133) Pulse Rate:  [61-87] 71 (09/16 1133) Resp:  [14-19] 18 (09/16  1133) BP: (92-131)/(42-89) 119/53 (09/16 1133) SpO2:  [88 %-100 %] 99 % (09/16 1133) Weight:  [70.9 kg] 70.9 kg (09/16 0458) Last BM Date: 12/28/20 General:   Drowsy female in NAD Eyes:  Pupils equal, sclera clear, no icterus.   Conjunctiva pink. Nose:  No deformity, discharge,  or lesions. Neck:  Supple; no masses Lungs:  Clear throughout to auscultation.   No wheezes, crackles, or rhonchi.  Heart:  Regular rate , no lower extremity edema Abdomen:  Soft, non-distended, grimacing with palpation of epigastrium. BS. No palp mass   Rectal:  Deferred  Msk:  Symmetrical without gross deformities. . Neurologic:  Drowsy, not answering questions. . Skin:  Intact without significant lesions or rashes.   Scheduled inpatient medications  amiodarone  400 mg Oral BID   Followed by   Derrill Memo ON 01/05/2021] amiodarone  200 mg Oral Daily   amLODipine  10 mg Oral Daily   Chlorhexidine Gluconate Cloth  6 each Topical Q0600   cilostazol  50 mg Oral BID   ezetimibe  10 mg Oral Daily   famotidine  20 mg Oral Daily   hydrALAZINE  25 mg Oral Q8H   insulin aspart  0-6 Units Subcutaneous TID WC   insulin aspart protamine- aspart  8 Units Subcutaneous Q breakfast   isosorbide mononitrate  30 mg Oral Daily   metoprolol succinate  50 mg Oral Daily   pantoprazole (PROTONIX) IV  40 mg Intravenous Daily   rosuvastatin  40 mg Oral Daily   sodium chloride flush  3 mL Intravenous Q12H      Intake/Output from previous day: 09/15 0701 - 09/16 0700 In: 1150 [P.O.:100; I.V.:800; IV Piggyback:250] Out: 101 [Urine:100; Blood:1] Intake/Output this shift: No intake/output data recorded.   Lab Results: Recent Labs    12/29/20 0332 12/30/20 0353 12/31/20 0225  WBC 9.0 8.5 7.3  HGB 9.0* 8.3* 8.2*  HCT 27.6* 25.9* 26.6*  PLT 296 316 338   BMET Recent Labs    12/29/20 0332 12/30/20 0353 12/31/20 0225  NA 126* 127* 126*  K 3.7 3.9 4.4  CL 84* 85* 84*  CO2 '26 25 22  '$ GLUCOSE 160* 118* 130*  BUN 49*  58* 64*  CREATININE 4.28* 4.92*  5.56*  CALCIUM 8.2* 8.0* 7.8*   LFT No results for input(s): PROT, ALBUMIN, AST, ALT, ALKPHOS, BILITOT, BILIDIR, IBILI in the last 72 hours. PT/INR No results for input(s): LABPROT, INR in the last 72 hours. Hepatitis Panel No results for input(s): HEPBSAG, HCVAB, HEPAIGM, HEPBIGM in the last 72 hours.   . CBC Latest Ref Rng & Units 12/31/2020 12/30/2020 12/29/2020  WBC 4.0 - 10.5 K/uL 7.3 8.5 9.0  Hemoglobin 12.0 - 15.0 g/dL 8.2(L) 8.3(L) 9.0(L)  Hematocrit 36.0 - 46.0 % 26.6(L) 25.9(L) 27.6(L)  Platelets 150 - 400 K/uL 338 316 296    . CMP Latest Ref Rng & Units 12/31/2020 12/30/2020 12/29/2020  Glucose 70 - 99 mg/dL 130(H) 118(H) 160(H)  BUN 8 - 23 mg/dL 64(H) 58(H) 49(H)  Creatinine 0.44 - 1.00 mg/dL 5.56(H) 4.92(H) 4.28(H)  Sodium 135 - 145 mmol/L 126(L) 127(L) 126(L)  Potassium 3.5 - 5.1 mmol/L 4.4 3.9 3.7  Chloride 98 - 111 mmol/L 84(L) 85(L) 84(L)  CO2 22 - 32 mmol/L '22 25 26  '$ Calcium 8.9 - 10.3 mg/dL 7.8(L) 8.0(L) 8.2(L)  Total Protein 6.5 - 8.1 g/dL - - -  Total Bilirubin 0.3 - 1.2 mg/dL - - -  Alkaline Phos 38 - 126 U/L - - -  AST 15 - 41 U/L - - -  ALT 0 - 44 U/L - - -   Studies/Results: DG Retrograde Pyelogram  Result Date: 12/31/2020 CLINICAL DATA:  Ureteral calculus EXAM: RETROGRADE PYELOGRAM COMPARISON:  CT 12/29/2020 FINDINGS: Single fluoroscopic spot image documents partial opacification of the renal collecting system and presence of ureteral stent. Laterality is not marked. IMPRESSION: Ureteral stent placement Electronically Signed   By: Lucrezia Europe M.D.   On: 12/31/2020 09:56   DG C-Arm 1-60 Min-No Report  Result Date: 12/30/2020 Fluoroscopy was utilized by the requesting physician.  No radiographic interpretation.   CT RENAL STONE STUDY  Result Date: 12/29/2020 CLINICAL DATA:  Hydronephrosis. EXAM: CT ABDOMEN AND PELVIS WITHOUT CONTRAST TECHNIQUE: Multidetector CT imaging of the abdomen and pelvis was performed following the  standard protocol without IV contrast. COMPARISON:  Ultrasound renal 12/29/2020. CT abdomen and pelvis 06/21/2015. FINDINGS: Lower chest: There are patchy ground-glass and atelectatic changes in both lung bases, right greater than left. There also airspace opacities in the right lower lobe. Heart is enlarged. Hepatobiliary: No focal liver abnormality is seen. Status post cholecystectomy. No biliary dilatation. Pancreas: Unremarkable. No pancreatic ductal dilatation or surrounding inflammatory changes. Spleen: Normal in size without focal abnormality. Adrenals/Urinary Tract: There is mild left-sided hydroureteronephrosis. There is a 6 mm calculus in the distal left ureter best seen on coronal image 6/53. There is a single punctate left renal calculus. No bladder calculi are visualized. The right kidney and adrenal glands are within normal limits. Stomach/Bowel: Stomach is within normal limits. Appendix is not visualized. No dilated bowel loops are seen. No bowel obstruction. There is mild inflammatory stranding surrounding the gastric antrum and duodenum. Vascular/Lymphatic: Aortic atherosclerosis. No enlarged abdominal or pelvic lymph nodes. Reproductive: Uterus and bilateral adnexa are unremarkable. Other: There is a small amount of free fluid in the right lower quadrant. There is no free air. There is mild diffuse body wall edema. There is no focal body wall hernia. Musculoskeletal: No fracture is seen. IMPRESSION: 1. 6 mm calculus in the distal left ureter with mild obstructive uropathy. 2. Additional nonobstructing left renal calculus. 3. Inflammatory stranding surrounding the gastric antrum and duodenum may be related to gastritis/duodenitis. Ulcer disease is a consideration. 4.  Small amount of free fluid in the right lower quadrant. 5. Gro patchy ground-glass and airspace opacities in the lung bases, right greater than left, worrisome for infection and or edema. 6.  Aortic Atherosclerosis (ICD10-I70.0).  Und-glass and Electronically Signed   By: Ronney Asters M.D.   On: 12/29/2020 18:40   DG HIP UNILAT WITH PELVIS 2-3 VIEWS LEFT  Result Date: 12/29/2020 CLINICAL DATA:  Status post fall EXAM: DG HIP (WITH OR WITHOUT PELVIS) 2-3V LEFT COMPARISON:  CT renal 12/29/2020 FINDINGS: There is no evidence of hip fracture or dislocation. There is no evidence of arthropathy or other focal bone abnormality. IMPRESSION: No acute displaced fracture or dislocation with better evaluation of the left hip on CT renal 12/29/2020. Electronically Signed   By: Iven Finn M.D.   On: 12/29/2020 20:21    Principal Problem:   Acute on chronic respiratory failure with hypoxia and hypercapnia (HCC) Active Problems:   HTN (hypertension)   Type 2 diabetes mellitus with stage 3b chronic kidney disease, with long-term current use of insulin (HCC)   Elevated troponin   Acute on chronic heart failure with preserved ejection fraction (HFpEF) ([place])   CAP (community acquired pneumonia)   Anemia of chronic disease    Tye Savoy, NP-C @  12/31/2020, 11:59 AM

## 2020-12-31 NOTE — Progress Notes (Signed)
Interventional Radiology Progress Note  Patient brought down for temp HD catheter placement. Placed on table and prepped and draped. Combative, screaming and refusing to have procedure performed. Currently unsafe to proceed with procedure. In current state, cannot imagine we would be able to accomplish any procedures without general anesthesia.  Venetia Night. Kathlene Cote, M.D Pager:  [phone]

## 2021-01-01 ENCOUNTER — Encounter (HOSPITAL_COMMUNITY)
Admission: EM | Disposition: A | Discharge status: Home Health | Payer: Self-pay | Source: Home / Self Care | Attending: Family Medicine

## 2021-01-01 ENCOUNTER — Inpatient Hospital Stay (HOSPITAL_COMMUNITY): Payer: Medicare Other

## 2021-01-01 DIAGNOSIS — J9622 Acute and chronic respiratory failure with hypercapnia: Secondary | ICD-10-CM | POA: Diagnosis not present

## 2021-01-01 DIAGNOSIS — J9621 Acute and chronic respiratory failure with hypoxia: Secondary | ICD-10-CM | POA: Diagnosis not present

## 2021-01-01 HISTORY — PX: IR US GUIDE VASC ACCESS RIGHT: IMG2390

## 2021-01-01 HISTORY — PX: IR FLUORO GUIDE CV LINE RIGHT: IMG2283

## 2021-01-01 LAB — GLUCOSE, CAPILLARY
Glucose-Capillary: 190 mg/dL — ABNORMAL HIGH (ref 70–99)
Glucose-Capillary: 174 mg/dL — ABNORMAL HIGH (ref 70–99)
Glucose-Capillary: 84 mg/dL (ref 70–99)
Glucose-Capillary: 71 mg/dL (ref 70–99)

## 2021-01-01 LAB — CBC
HCT: 26.8 % — ABNORMAL LOW (ref 36.0–46.0)
Hemoglobin: 8.7 g/dL — ABNORMAL LOW (ref 12.0–15.0)
MCH: 27.4 pg (ref 26.0–34.0)
MCHC: 32.5 g/dL (ref 30.0–36.0)
MCV: 84.3 fL (ref 80.0–100.0)
Platelets: 355 10*3/uL (ref 150–400)
RBC: 3.18 MIL/uL — ABNORMAL LOW (ref 3.87–5.11)
RDW: 14.8 % (ref 11.5–15.5)
WBC: 8.9 10*3/uL (ref 4.0–10.5)
nRBC: 0 % (ref 0.0–0.2)

## 2021-01-01 LAB — RENAL FUNCTION PANEL
Albumin: 2.9 g/dL — ABNORMAL LOW (ref 3.5–5.0)
Anion gap: 21 — ABNORMAL HIGH (ref 5–15)
BUN: 76 mg/dL — ABNORMAL HIGH (ref 8–23)
CO2: 20 mmol/L — ABNORMAL LOW (ref 22–32)
Calcium: 7.5 mg/dL — ABNORMAL LOW (ref 8.9–10.3)
Chloride: 84 mmol/L — ABNORMAL LOW (ref 98–111)
Creatinine, Ser: 6.5 mg/dL — ABNORMAL HIGH (ref 0.44–1.00)
GFR, Estimated: 6 mL/min — ABNORMAL LOW (ref >60)
Glucose, Bld: 203 mg/dL — ABNORMAL HIGH (ref 70–99)
Phosphorus: 6.7 mg/dL — ABNORMAL HIGH (ref 2.5–4.6)
Potassium: 4.8 mmol/L (ref 3.5–5.1)
Sodium: 125 mmol/L — ABNORMAL LOW (ref 135–145)

## 2021-01-01 LAB — HEPATITIS B SURFACE ANTIBODY, QUANTITATIVE: Hep B S AB Quant (Post): 258.4 m[IU]/mL (ref >9.9)

## 2021-01-01 SURGERY — EGD (ESOPHAGOGASTRODUODENOSCOPY)
Anesthesia: Monitor Anesthesia Care

## 2021-01-01 MED ORDER — MIDAZOLAM HCL 2 MG/2ML IJ SOLN
INTRAMUSCULAR | Status: DC | PRN
Start: 2021-01-01 — End: 2021-01-05
  Administered 2021-01-01: .5 mg via INTRAVENOUS

## 2021-01-01 MED ORDER — HEPARIN SODIUM (PORCINE) 1000 UNIT/ML IJ SOLN
INTRAMUSCULAR | Status: AC
  Administered 2021-01-01: 3000 [IU]
  Filled 2021-01-01: qty 1

## 2021-01-01 MED ORDER — MIDAZOLAM HCL 2 MG/2ML IJ SOLN
INTRAMUSCULAR | Status: AC
  Filled 2021-01-01: qty 2

## 2021-01-01 MED ORDER — LIDOCAINE HCL 1 % IJ SOLN
INTRAMUSCULAR | Status: AC
  Filled 2021-01-01: qty 20

## 2021-01-01 MED ORDER — HEPARIN SODIUM (PORCINE) 1000 UNIT/ML IJ SOLN
INTRAMUSCULAR | Status: DC | PRN
  Administered 2021-01-01: 3000 [IU] via INTRAVENOUS

## 2021-01-01 MED ORDER — FENTANYL CITRATE (PF) 100 MCG/2ML IJ SOLN
INTRAMUSCULAR | Status: DC | PRN
  Administered 2021-01-01: 25 ug via INTRAVENOUS

## 2021-01-01 MED ORDER — LIDOCAINE HCL 1 % IJ SOLN
INTRAMUSCULAR | Status: DC | PRN
  Administered 2021-01-01: 5 mL

## 2021-01-01 MED ORDER — FENTANYL CITRATE (PF) 100 MCG/2ML IJ SOLN
INTRAMUSCULAR | Status: AC
  Filled 2021-01-01: qty 2

## 2021-01-01 NOTE — Consult Note (Signed)
Brief orthopedic consult note:  Orthopedics was consulted for this patient after a fall in the hospital where she sustained a nondisplaced intertrochanteric hip fracture on the left.  Patient has multiple medical problems and is planned to undergo hemodialysis daily for the next few days per hospitalist.  Then to the bedside the patient was in hemodialysis.  We will come back once patient in the room.  Patient would likely benefit from operative intervention for her left hip fracture and we will plan to do this when she is medically stable and cleared.  We will follow along and a full consult note to follow.  Bedrest for now.  Okay for diet.

## 2021-01-01 NOTE — Progress Notes (Signed)
PROGRESS NOTE    Molly [name]  [id number] DOB: 14-Oct-1950 DOA: 12/27/2020 PCP: [name], MD   Brief Narrative:  70 year old female with past medical history of hypertension, left renal artery stenosis ([id number]) diastolic CHF, CKD stage IV, CAD, PAD with bilateral SFA CTO's, anemia chronic disease diabetes presented on 12/27/2020 with 4 days of chest pain.  BP was uncontrolled and spiked as high as 234/208 in the ED.  Chest x-ray consistent with pulmonary edema.  Patient briefly required BiPAP.   Admitted to [facility] service with cardiology consulted. Patient developed A. fib with RVR on 9/13.  Assessment & Plan:   Principal Problem:   Acute on chronic respiratory failure with hypoxia and hypercapnia (HCC) Active Problems:   HTN (hypertension)   Type 2 diabetes mellitus with stage 3b chronic kidney disease, with long-term current use of insulin (HCC)   Elevated troponin   Acute on chronic heart failure with preserved ejection fraction (HFpEF) ([place])   CAP (community acquired pneumonia)   Anemia of chronic disease  Acute metabolic/uremic encephalopathy: Slightly more lethargic today.  Now has had dialysis catheter placed.  Scheduled for dialysis today.  I am very hopeful that she will turn around after that.  Acute respiratory failure with hypoxia secondary to recurrent flash pulmonary edema/acute systolic congestive heart failure: Echo shows reduced ejection fraction to 40 to 45% compared to the previous 1.  Cardiology on board and managing.  Due to rising creatinine, she is on diuretics holiday.  No plans for diuretics per cardiology.  Plan for hemodialysis today.  Fall in hospital/left hip fracture: CT left hip indicates left hip fracture.  I have consulted and discussed with Dr. Lucia Gaskins of orthopedics.  He will see patient.  Hypertensive emergency with chest pain: No chest pain and blood pressure controlled now.  Continue amlodipine 10 mg p.o. daily, hydralazine 25 mg p.o. 3  times daily Imdur to 30 mg p.o. daily, Toprol-XL 50 mg p.o. daily.  Left renal artery stenosis: Has a history of 80 to 90% of proximal left renal artery stenosis.  Plan was to do angiogram and stent but due to rising creatinine, this plan is on hold for now.  Cardiology and nephrology managing.  New onset A. fib with RVR: Developed overnight 12/28/2020.  She was initially started on amiodarone drip and converted back to sinus rhythm.  Transitioned to oral amiodarone but now that she is unable to take anything p.o., she has been transitioned back to IV amiodarone.  Not on any anticoagulation due to slight drop in hemoglobin.  Possible gastroduodenitis: CT abdomen suggests possible gastroduodenitis.  Seen by GI.  They recommend EGD which patient is not stable for that procedure.  Prior to his hemodialysis.  She has been started on Protonix.  Will eventually need EGD once more stable.  Defer to GI.  Epistaxis: Nosebleed overnight 12/30/2066, no further episodes.  Heparin was stopped and has remained on hold since then.  AKI on CKD stage IV/progressive renal failure/obstructive uropathy/left ureteral stone/left hydronephrosis: CT renal stone yesterday shows 6 mm of left ureteral stone causing obstruction/hydronephrosis.  Patient underwent cystoscopy with left ureteral stent placement but despite of this, she has had poor urine output with rising creatinine and now she is uremic as well.  Plan for dialysis by nephrology today.   Hyponatremia: 125, likely due to hemodilution.  Hopefully this will improve with fixing the main issue of urinary obstruction.  Community-acquired pneumonia: Completed 5 days of Rocephin on 12/31/2020.  Elevated troponin/demand ischemia: In  the setting of hypertensive crisis.  EKG without ischemic changes.  Likely demand ischemia. 397 > 376.  Cardiology on board.  No plan for intervention.  Type 2 diabetes mellitus: Recent hemoglobin A1c 8.2% on 11/27/2020.  Home regimen includes  Humulin 70/30 mix 8 to 12 units daily.  Currently on 8 units here and SSI.  Blood sugar controlled.  Continue current regimen.  Dyslipidemia: Continue Zetia and Crestor.  PAD: Continue statin and Pletal.  Anemia of chronic disease: Hemoglobin has remained stable since yesterday around 8.2.  CT renal stone indicates possible gastritis or gastric ulcer.  Cardiology has consulted GI.  Continue Protonix.  GERD: Continue PPI.  DVT prophylaxis:    Code Status: Full Code  Family Communication: Patient's husband present at bedside.  Plan of care discussed with him in length.    Status is: Inpatient  Remains inpatient appropriate because:Ongoing diagnostic testing needed not appropriate for outpatient work up  Dispo: The patient is from: Home              Anticipated d/c is to: Home              Patient currently is not medically stable to d/c.   Difficult to place patient No        Estimated body mass index is 27.91 kg/m as calculated from the following:   Height as of this encounter: '5\' 2"'$  (1.575 m).   Weight as of this encounter: 69.2 kg.     Nutritional Assessment: Body mass index is 27.91 kg/m.Marland Kitchen Seen by dietician.  I agree with the assessment and plan as outlined below: Nutrition Status:       Skin Assessment: I have examined the patient's skin and I agree with the wound assessment as performed by the wound care RN as outlined below:    Consultants:  Nephrology Cardiology Orthopedic Urology  Procedures:  As above  Antimicrobials:  Anti-infectives (From admission, onward)    Start     Dose/Rate Route Frequency Ordered Stop   12/31/20 1330  cefTRIAXone (ROCEPHIN) 1 g in sodium chloride 0.9 % 100 mL IVPB        1 g 200 mL/hr over 30 Minutes Intravenous  Once 12/31/20 1315 12/31/20 1350   12/31/20 1000  cefTRIAXone (ROCEPHIN) 1 g in sodium chloride 0.9 % 100 mL IVPB  Status:  Discontinued        1 g 200 mL/hr over 30 Minutes Intravenous Every 24 hours  12/30/20 1202 12/31/20 1315   12/27/20 1530  cefTRIAXone (ROCEPHIN) 2 g in sodium chloride 0.9 % 100 mL IVPB  Status:  Discontinued        2 g 200 mL/hr over 30 Minutes Intravenous Every 24 hours 12/27/20 1435 12/30/20 1202          Subjective: Patient seen and examined.  Patient very lethargic.  Cannot hold any conversation.  Husband at the bedside.  Patient protecting her airways.  Objective: Vitals:   01/01/21 1045 01/01/21 1050 01/01/21 1057 01/01/21 1126  BP: (!) 142/54 (!) 134/56 (!) 114/46 (!) 116/53  Pulse: 78 75 64 70  Resp: '19 15 10 18  '$ Temp:    98.6 F (37 C)  TempSrc:    Oral  SpO2: 94% 93% 92% 97%  Weight:      Height:        Intake/Output Summary (Last 24 hours) at 01/01/2021 1249 Last data filed at 12/31/2020 2200 Gross per 24 hour  Intake 144.66 ml  Output 0 ml  Net 144.66 ml    Filed Weights   12/30/20 0223 12/31/20 0458 01/01/21 0001  Weight: 67 kg 70.9 kg 69.2 kg    Examination: General exam: Appears very lethargic. Respiratory system: Diminished breath sounds with some bibasilar rhonchi. Cardiovascular system: S1 & S2 heard, RRR. No JVD, murmurs, rubs, gallops or clicks. No pedal edema. Gastrointestinal system: Abdomen is nondistended, soft and nontender. No organomegaly or masses felt. Normal bowel sounds heard. Central nervous system: Lethargic.. Skin: No rashes, lesions or ulcers.     Data Reviewed: I have personally reviewed following labs and imaging studies  CBC: Recent Labs  Lab 12/27/20 0640 12/27/20 0847 12/29/20 0332 12/30/20 0353 12/31/20 0225 01/01/21 0242  WBC 8.8  --  9.0 8.5 7.3 8.9  HGB 11.4* 12.9 9.0* 8.3* 8.2* 8.7*  HCT 35.9* 38.0 27.6* 25.9* 26.6* 26.8*  MCV 87.3  --  83.6 85.2 87.2 84.3  PLT 369  --  296 316 338 [id number]    Basic Metabolic Panel: Recent Labs  Lab 12/28/20 0336 12/29/20 0332 12/30/20 0353 12/31/20 0225 01/01/21 0242  NA 129* 126* 127* 126* 125*  K 3.5 3.7 3.9 4.4 4.8  CL 87* 84* 85* 84* 84*   CO2 '31 26 25 22 '$ 20*  GLUCOSE 177* 160* 118* 130* 203*  BUN 44* 49* 58* 64* 76*  CREATININE 3.79* 4.28* 4.92* 5.56* 6.50*  CALCIUM 8.7* 8.2* 8.0* 7.8* 7.5*  MG  --  2.1  --   --   --   PHOS  --   --   --   --  6.7*    GFR: Estimated Creatinine Clearance: 7.5 mL/min (A) (by C-G formula based on SCr of 6.5 mg/dL (H)). Liver Function Tests: Recent Labs  Lab 01/01/21 0242  ALBUMIN 2.9*   No results for input(s): LIPASE, AMYLASE in the last 168 hours. No results for input(s): AMMONIA in the last 168 hours. Coagulation Profile: No results for input(s): INR, PROTIME in the last 168 hours. Cardiac Enzymes: No results for input(s): CKTOTAL, CKMB, CKMBINDEX, TROPONINI in the last 168 hours. BNP (last 3 results) No results for input(s): PROBNP in the last 8760 hours. HbA1C: No results for input(s): HGBA1C in the last 72 hours. CBG: Recent Labs  Lab 12/31/20 0910 12/31/20 1131 12/31/20 2054 01/01/21 0615 01/01/21 1124  GLUCAP 151* 172* 188* 190* 174*    Lipid Profile: No results for input(s): CHOL, HDL, LDLCALC, TRIG, CHOLHDL, LDLDIRECT in the last 72 hours. Thyroid Function Tests: No results for input(s): TSH, T4TOTAL, FREET4, T3FREE, THYROIDAB in the last 72 hours. Anemia Panel: No results for input(s): VITAMINB12, FOLATE, FERRITIN, TIBC, IRON, RETICCTPCT in the last 72 hours. Sepsis Labs: Recent Labs  Lab 12/27/20 1130  PROCALCITON 0.39     Recent Results (from the past 240 hour(s))  Resp Panel by RT-PCR (Flu A&B, Covid) Nasopharyngeal Swab     Status: None   Collection Time: 12/27/20  8:53 AM   Specimen: Nasopharyngeal Swab; Nasopharyngeal(NP) swabs in vial transport medium  Result Value Ref Range Status   SARS Coronavirus 2 by RT PCR NEGATIVE NEGATIVE Final    Comment: (NOTE) SARS-CoV-2 target nucleic acids are NOT DETECTED.  The SARS-CoV-2 RNA is generally detectable in upper respiratory specimens during the acute phase of infection. The lowest concentration  of SARS-CoV-2 viral copies this assay can detect is 138 copies/mL. A negative result does not preclude SARS-Cov-2 infection and should not be used as the sole basis for treatment or other patient management decisions. A negative  result may occur with  improper specimen collection/handling, submission of specimen other than nasopharyngeal swab, presence of viral mutation(s) within the areas targeted by this assay, and inadequate number of viral copies(<138 copies/mL). A negative result must be combined with clinical observations, patient history, and epidemiological information. The expected result is Negative.  Fact Sheet for Patients:  EntrepreneurPulse.com.au  Fact Sheet for Healthcare Providers:  IncredibleEmployment.be  This test is no t yet approved or cleared by the [place] FDA and  has been authorized for detection and/or diagnosis of SARS-CoV-2 by FDA under an Emergency Use Authorization (EUA). This EUA will remain  in effect (meaning this test can be used) for the duration of the COVID-19 declaration under Section 564(b)(1) of the Act, 21 U.S.C.section 360bbb-3(b)(1), unless the authorization is terminated  or revoked sooner.       Influenza A by PCR NEGATIVE NEGATIVE Final   Influenza B by PCR NEGATIVE NEGATIVE Final    Comment: (NOTE) The Xpert Xpress SARS-CoV-2/FLU/RSV plus assay is intended as an aid in the diagnosis of influenza from Nasopharyngeal swab specimens and should not be used as a sole basis for treatment. Nasal washings and aspirates are unacceptable for Xpert Xpress SARS-CoV-2/FLU/RSV testing.  Fact Sheet for Patients: EntrepreneurPulse.com.au  Fact Sheet for Healthcare Providers: IncredibleEmployment.be  This test is not yet approved or cleared by the [place] FDA and has been authorized for detection and/or diagnosis of SARS-CoV-2 by FDA under an Emergency Use  Authorization (EUA). This EUA will remain in effect (meaning this test can be used) for the duration of the COVID-19 declaration under Section 564(b)(1) of the Act, 21 U.S.C. section 360bbb-3(b)(1), unless the authorization is terminated or revoked.  Performed at [facility], [place] [address], [place], [place] [address]        Radiology Studies: DG Retrograde Pyelogram  Result Date: 12/31/2020 CLINICAL DATA:  Ureteral calculus EXAM: RETROGRADE PYELOGRAM COMPARISON:  CT 12/29/2020 FINDINGS: Single fluoroscopic spot image documents partial opacification of the renal collecting system and presence of ureteral stent. Laterality is not marked. IMPRESSION: Ureteral stent placement Electronically Signed   By: Lucrezia Europe M.D.   On: 12/31/2020 09:56   CT HIP LEFT WO CONTRAST  Result Date: 12/31/2020 CLINICAL DATA:  Acute limping.  Left hip pain. EXAM: CT OF THE LEFT HIP WITHOUT CONTRAST TECHNIQUE: Multidetector CT imaging of the left hip was performed according to the standard protocol. Multiplanar CT image reconstructions were also generated. COMPARISON:  Radiographs 12/29/2020 FINDINGS: Bones/Joint/Cartilage Acute intertrochanteric fracture of the left hip, not readily appreciable on the recent CT pelvis from 12/29/2020. Minimal comminution along the greater tuberosity portion. No other regional pelvic fracture is observed. Ligaments Suboptimally assessed by CT. Muscles and Tendons Unremarkable Soft tissues Substantial atherosclerotic vascular calcifications. Contrast medium in the urinary bladder and left distal ureter. Small amount of pelvic ascites adjacent to the uterus. Diffuse subcutaneous edema. IMPRESSION: 1. Acute left hip intertrochanteric fracture with minimal comminution along the greater trochanter. 2. Diffuse subcutaneous edema.  Small amount of ascites. 3. Atherosclerosis. Electronically Signed   By: Van Clines M.D.   On: 12/31/2020 15:20   DG C-Arm 1-60 Min-No  Report  Result Date: 12/30/2020 Fluoroscopy was utilized by the requesting physician.  No radiographic interpretation.    Scheduled Meds:  Chlorhexidine Gluconate Cloth  6 each Topical Q0600   cilostazol  50 mg Oral BID   ezetimibe  10 mg Oral Daily   famotidine  20 mg Oral Daily   fentaNYL  heparin sodium (porcine)       hydrALAZINE  25 mg Oral Q8H   insulin aspart  0-6 Units Subcutaneous TID WC   insulin aspart protamine- aspart  8 Units Subcutaneous Q breakfast   isosorbide mononitrate  30 mg Oral Daily   lidocaine       metoprolol succinate  50 mg Oral Daily   midazolam       pantoprazole (PROTONIX) IV  40 mg Intravenous Daily   rosuvastatin  40 mg Oral Daily   sodium chloride flush  3 mL Intravenous Q12H   Continuous Infusions:  sodium chloride     sodium chloride     sodium chloride     amiodarone 30 mg/hr (01/01/21 0542)     LOS: 5 days   Time spent: 35 minutes   Darliss Cheney, MD [facility]  01/01/2021, 12:49 PM  Please page via Shea [facility] and do not message via secure chat for anything urgent. Secure chat can be used for anything non urgent and I will respond at my earliest availability.  How to contact the [facility] Attending or Consulting provider [place] or covering provider during after hours [place], for this patient?  Check the care team in [facility] and look for a) attending/consulting [facility] provider listed and b) the [facility] team listed. Page or secure chat [phone]. Log into [email or web address] and use 's universal password to access. If you do not have the password, please contact the hospital operator. Locate the [facility] provider you are looking for under [facility] and page to a number that you can be directly reached. If you still have difficulty reaching the provider, please page the [facility] (Director on Call) for the Hospitalists listed on amion for assistance.

## 2021-01-01 NOTE — Plan of Care (Signed)
  Problem: Safety: Goal: Ability to remain free from injury will improve Outcome: Progressing   

## 2021-01-01 NOTE — Progress Notes (Signed)
[facility] Progress Note    Assessment/ Plan:   [name] is a 22yoF with a PMHx of HTN, left renal artery stenosis, HFpEF, CKD 3/4, CAD, PAD and diabetes who presented with chest pain, acute hypoxemic respiratory failure and hypertensive emergency.   AKI on CKD 3-4- Likely secondary to hemodynamic insults from HTN crisis, renal artery stenosis, afib w/ RVR worsened in the setting of a mildly obstructive left ureteral stone. Now s/p left ureteral stent placement but with no improvement. Baseline Cr ~2.6, though recently discharged with a Cr of 3.4.  -kidney function continues to worsen and seemingly more uremic, failed lasix challenge 9/18. IR unable to place catheter yesterday due to her being combative, screaming, refusing procedure on 9/16--needs gen anesthesia. Discussed with IR this AM for procedure today. Tentative plan is to get her on HD today, tomorrow, and Monday (depending on schedule/census)--slow start protocol - Avoid RAAS blockers or MRA's - Trend BMP, renal dose medications for GFR<15 - Monitor Daily I/Os, Daily weight  - Maintain MAP>65 for optimal renal perfusion.  - Continue to hold ACE-I, avoid further nephrotoxins including NSAIDS, Morphine. Unless absolutely necessary, avoid CT with contrast and/or MRI with gadolinium.   Microscopic hematuria Left obstructive uropathy-  -in the setting of mildly obstructing 6 mm distal left ureter renal stone seen on CT urogram. S/p left ureteral stent on 9/15. ANCA and lupus serologies wnl. Appreciate urology's assistance  Left hip pain- s/p fall in the hospital. CT left hip ordered. Patient on dilaudid for pain, required narcan this morning due to being unresponsive. -Preferred narcotic agents for pain control are hydromorphone, fentanyl, and methadone. Morphine should not be used. Avoid Baclofen  Epistaxis- overnight 9/14, since resolved. Thought to be due to nasal canula. Heparin on hold, continue to monitor. Anca  serologies neg  Left renal artery stenosis- Hx of 80-90% proximal left renal artery stenosis. -as resp status and Afib improve, considering CO2 and stent of left renal artery however on hold given the fact that there is some contrast given. Once on HD, would consider proceeding with this since we will be monitoring    AHRF, acute on chronic dCHF exacerbation- likely flash pulmonary edema. Holding lasix in the setting of AKI. Remains on supplemental oxygen. Minimal urine output. Plan for HD today.    Hypertension- BP well controlled on norvasc, metoprolol, and hydralazine   Hyponatremia-  worsening in the setting of lasix and AKI.  -Continue fluid restriction 1.2L   Afib w/ RVR-on amio. Hep gtt on hold given epistaxis, which has resolved.   Anemia due to chronic kidney disease-Hgb stable at 8.7. Hep gtt on hold given nosebleeds -Transfuse for Hgb<7 g/dL   Diabetes Mellitus Type 2 with Hyperglycemia- -per primary  Discussed with IR and primary service.    Subjective:    Yelling in room, shouting for her mother. Husband at bedside who is understandably worried. Not following commands Unable to have temp line place yesterday, see above Received lasix yesterday, no urine output now.  Objective:   BP (!) 139/99 (BP Location: Right Arm)   Pulse 81   Temp 98.3 F (36.8 C) (Oral)   Resp 18   Ht '5\' 2"'$  (1.575 m)   Wt 69.2 kg   SpO2 90%   BMI 27.91 kg/m   Intake/Output Summary (Last 24 hours) at 01/01/2021 0810 Last data filed at 12/31/2020 2200 Gross per 24 hour  Intake 144.66 ml  Output 0 ml  Net 144.66 ml   Weight  change: -1.681 kg  Physical Exam: Gen: in distress, yelling, laying flat in bed CVS:RRR, no murmurs Resp:crackles bibasilar, normal WOB [id number], nontender Ext:no edema Neuro: not orientable, yelling, moving all ext spontaneously  Imaging: DG Retrograde Pyelogram  Result Date: 12/31/2020 CLINICAL DATA:  Ureteral calculus EXAM: RETROGRADE PYELOGRAM  COMPARISON:  CT 12/29/2020 FINDINGS: Single fluoroscopic spot image documents partial opacification of the renal collecting system and presence of ureteral stent. Laterality is not marked. IMPRESSION: Ureteral stent placement Electronically Signed   By: Lucrezia Europe M.D.   On: 12/31/2020 09:56   CT HIP LEFT WO CONTRAST  Result Date: 12/31/2020 CLINICAL DATA:  Acute limping.  Left hip pain. EXAM: CT OF THE LEFT HIP WITHOUT CONTRAST TECHNIQUE: Multidetector CT imaging of the left hip was performed according to the standard protocol. Multiplanar CT image reconstructions were also generated. COMPARISON:  Radiographs 12/29/2020 FINDINGS: Bones/Joint/Cartilage Acute intertrochanteric fracture of the left hip, not readily appreciable on the recent CT pelvis from 12/29/2020. Minimal comminution along the greater tuberosity portion. No other regional pelvic fracture is observed. Ligaments Suboptimally assessed by CT. Muscles and Tendons Unremarkable Soft tissues Substantial atherosclerotic vascular calcifications. Contrast medium in the urinary bladder and left distal ureter. Small amount of pelvic ascites adjacent to the uterus. Diffuse subcutaneous edema. IMPRESSION: 1. Acute left hip intertrochanteric fracture with minimal comminution along the greater trochanter. 2. Diffuse subcutaneous edema.  Small amount of ascites. 3. Atherosclerosis. Electronically Signed   By: Van Clines M.D.   On: 12/31/2020 15:20   DG C-Arm 1-60 Min-No Report  Result Date: 12/30/2020 Fluoroscopy was utilized by the requesting physician.  No radiographic interpretation.    Labs: BMET Recent Labs  Lab 12/27/20 0640 12/27/20 0847 12/28/20 0336 12/29/20 0332 12/30/20 0353 12/31/20 0225 01/01/21 0242  NA 131* 130* 129* 126* 127* 126* 125*  K 3.8 3.8 3.5 3.7 3.9 4.4 4.8  CL 91*  --  87* 84* 85* 84* 84*  CO2 26  --  '31 26 25 22 '$ 20*  GLUCOSE 166*  --  177* 160* 118* 130* 203*  BUN 36*  --  44* 49* 58* 64* 76*  CREATININE  3.53*  --  3.79* 4.28* 4.92* 5.56* 6.50*  CALCIUM 9.7  --  8.7* 8.2* 8.0* 7.8* 7.5*  PHOS  --   --   --   --   --   --  6.7*   CBC Recent Labs  Lab 12/29/20 0332 12/30/20 0353 12/31/20 0225 01/01/21 0242  WBC 9.0 8.5 7.3 8.9  HGB 9.0* 8.3* 8.2* 8.7*  HCT 27.6* 25.9* 26.6* 26.8*  MCV 83.6 85.2 87.2 84.3  PLT 296 316 338 355    Medications:     amLODipine  10 mg Oral Daily   Chlorhexidine Gluconate Cloth  6 each Topical Q0600   cilostazol  50 mg Oral BID   ezetimibe  10 mg Oral Daily   famotidine  20 mg Oral Daily   hydrALAZINE  25 mg Oral Q8H   insulin aspart  0-6 Units Subcutaneous TID WC   insulin aspart protamine- aspart  8 Units Subcutaneous Q breakfast   isosorbide mononitrate  30 mg Oral Daily   metoprolol succinate  50 mg Oral Daily   pantoprazole (PROTONIX) IV  40 mg Intravenous Daily   rosuvastatin  40 mg Oral Daily   sodium chloride flush  3 mL Intravenous Q12H   Gean Quint, MD [facility] 01/01/2021, 8:10 AM

## 2021-01-01 NOTE — Progress Notes (Signed)
Cardiology Progress Note  Patient ID: Molly [name] MRN: [id number] DOB: Mar 22, 1951 (70 years old) Date of Encounter: 01/01/2021 (70 years old)  Primary Cardiologist: Jenne Campus, MD  Subjective   Chief Complaint: Confused   HPI: Confused this morning.  Yelling for her brother.  Husband at the bedside.  Hemodynamically stable.  Plans for dialysis today.  Now with hip fracture.  GI with plans for evaluation as well at some point.  ROS:  All other ROS reviewed and negative. Pertinent positives noted in the HPI.     Inpatient Medications  Scheduled Meds:  amLODipine  10 mg Oral Daily   Chlorhexidine Gluconate Cloth  6 each Topical Q0600   cilostazol  50 mg Oral BID   ezetimibe  10 mg Oral Daily   famotidine  20 mg Oral Daily   hydrALAZINE  25 mg Oral Q8H   insulin aspart  0-6 Units Subcutaneous TID WC   insulin aspart protamine- aspart  8 Units Subcutaneous Q breakfast   isosorbide mononitrate  30 mg Oral Daily   metoprolol succinate  50 mg Oral Daily   pantoprazole (PROTONIX) IV  40 mg Intravenous Daily   rosuvastatin  40 mg Oral Daily   sodium chloride flush  3 mL Intravenous Q12H   Continuous Infusions:  sodium chloride     sodium chloride     sodium chloride     amiodarone 30 mg/hr (01/01/21 0542)   PRN Meds: sodium chloride, sodium chloride, sodium chloride, acetaminophen, alteplase, heparin, hydrocortisone cream, HYDROmorphone (DILAUDID) injection, lidocaine (PF), lidocaine-prilocaine, loperamide, loratadine, naLOXone (NARCAN)  injection, pentafluoroprop-tetrafluoroeth, sodium chloride, sodium chloride flush   Vital Signs   Vitals:   12/31/20 1305 12/31/20 2057 01/01/21 0001 01/01/21 0741  BP: (!) 113/48 (!) 122/52  (!) 139/99  Pulse: 70 77  81  Resp: '20 18  18  '$ Temp: 97.7 F (36.5 C) 98.2 F (36.8 C)  98.3 F (36.8 C)  TempSrc: Oral Oral  Oral  SpO2: 98% 98%  90%  Weight:   69.2 kg   Height:        Intake/Output Summary (Last 24 hours) at 01/01/2021 0849 Last data filed at  12/31/2020 2200 Gross per 24 hour  Intake 144.66 ml  Output 0 ml  Net 144.66 ml   Last 3 Weights 01/01/2021 12/31/2020 12/30/2020  Weight (lbs) 152 lb 9.6 oz 156 lb 4.9 oz 147 lb 11.3 oz  Weight (kg) 69.219 kg 70.9 kg 67 kg      Telemetry  Overnight telemetry shows sinus rhythm in the 70, which I personally reviewed.   Physical Exam   Vitals:   12/31/20 1305 12/31/20 2057 01/01/21 0001 01/01/21 0741  BP: (!) 113/48 (!) 122/52  (!) 139/99  Pulse: 70 77  81  Resp: '20 18  18  '$ Temp: 97.7 F (36.5 C) 98.2 F (36.8 C)  98.3 F (36.8 C)  TempSrc: Oral Oral  Oral  SpO2: 98% 98%  90%  Weight:   69.2 kg   Height:        Intake/Output Summary (Last 24 hours) at 01/01/2021 0849 Last data filed at 12/31/2020 2200 Gross per 24 hour  Intake 144.66 ml  Output 0 ml  Net 144.66 ml    Last 3 Weights 01/01/2021 12/31/2020 12/30/2020  Weight (lbs) 152 lb 9.6 oz 156 lb 4.9 oz 147 lb 11.3 oz  Weight (kg) 69.219 kg 70.9 kg 67 kg    Body mass index is 27.91 kg/m.  General: Ill-appearing Head: Atraumatic, normal size  Eyes: PEERLA, EOMI  Neck: Supple, no JVD Endocrine: No thryomegaly Cardiac: Normal S1, S2; RRR; no murmurs, rubs, or gallops Lungs: Diminished breath sounds Abd: Soft, nontender, no hepatomegaly  Ext: No edema, pulses 2+ Musculoskeletal: No deformities, BUE and BLE strength normal and equal Skin: Warm and dry, no rashes   Neuro: Awake, drowsy, not following commands  Labs  High Sensitivity Troponin:   Recent Labs  Lab 12/27/20 0640 12/27/20 0835  TROPONINIHS 397* 376*     Cardiac EnzymesNo results for input(s): TROPONINI in the last 168 hours. No results for input(s): TROPIPOC in the last 168 hours.  Chemistry Recent Labs  Lab 12/30/20 0353 12/31/20 0225 01/01/21 0242  NA 127* 126* 125*  K 3.9 4.4 4.8  CL 85* 84* 84*  CO2 25 22 20*  GLUCOSE 118* 130* 203*  BUN 58* 64* 76*  CREATININE 4.92* 5.56* 6.50*  CALCIUM 8.0* 7.8* 7.5*  ALBUMIN  --   --  2.9*  GFRNONAA  9* 8* 6*  ANIONGAP 17* 20* 21*    Hematology Recent Labs  Lab 12/30/20 0353 12/31/20 0225 01/01/21 0242  WBC 8.5 7.3 8.9  RBC 3.04* 3.05* 3.18*  HGB 8.3* 8.2* 8.7*  HCT 25.9* 26.6* 26.8*  MCV 85.2 87.2 84.3  MCH 27.3 26.9 27.4  MCHC 32.0 30.8 32.5  RDW 14.4 14.7 14.8  PLT 316 338 355   BNP Recent Labs  Lab 12/27/20 0835  BNP 1,102.1*    DDimer No results for input(s): DDIMER in the last 168 hours.   Radiology  DG Retrograde Pyelogram  Result Date: 12/31/2020 CLINICAL DATA:  Ureteral calculus EXAM: RETROGRADE PYELOGRAM COMPARISON:  CT 12/29/2020 FINDINGS: Single fluoroscopic spot image documents partial opacification of the renal collecting system and presence of ureteral stent. Laterality is not marked. IMPRESSION: Ureteral stent placement Electronically Signed   By: Lucrezia Europe M.D.   On: 12/31/2020 09:56   CT HIP LEFT WO CONTRAST  Result Date: 12/31/2020 CLINICAL DATA:  Acute limping.  Left hip pain. EXAM: CT OF THE LEFT HIP WITHOUT CONTRAST TECHNIQUE: Multidetector CT imaging of the left hip was performed according to the standard protocol. Multiplanar CT image reconstructions were also generated. COMPARISON:  Radiographs 12/29/2020 FINDINGS: Bones/Joint/Cartilage Acute intertrochanteric fracture of the left hip, not readily appreciable on the recent CT pelvis from 12/29/2020. Minimal comminution along the greater tuberosity portion. No other regional pelvic fracture is observed. Ligaments Suboptimally assessed by CT. Muscles and Tendons Unremarkable Soft tissues Substantial atherosclerotic vascular calcifications. Contrast medium in the urinary bladder and left distal ureter. Small amount of pelvic ascites adjacent to the uterus. Diffuse subcutaneous edema. IMPRESSION: 1. Acute left hip intertrochanteric fracture with minimal comminution along the greater trochanter. 2. Diffuse subcutaneous edema.  Small amount of ascites. 3. Atherosclerosis. Electronically Signed   By: Van Clines M.D.   On: 12/31/2020 15:20   DG C-Arm 1-60 Min-No Report  Result Date: 12/30/2020 Fluoroscopy was utilized by the requesting physician.  No radiographic interpretation.    Cardiac Studies  TTE 12/28/2020  1. Compared with the echo A999333, systolic function is reduced. Left  ventricular ejection fraction, by estimation, is 40 to 45%. The left  ventricle has mildly decreased function. The left ventricle demonstrates  global hypokinesis. Left ventricular  diastolic parameters are indeterminate.   2. Right ventricular systolic function is normal. The right ventricular  size is normal. There is moderately elevated pulmonary artery systolic  pressure.   3. The pericardial effusion is circumferential.   4. The mitral valve is normal  in structure. Mild mitral valve  regurgitation. No evidence of mitral stenosis.   5. The aortic valve is tricuspid. There is mild calcification of the  aortic valve. There is mild thickening of the aortic valve. Aortic valve  regurgitation is not visualized. No aortic stenosis is present.   6. The inferior vena cava is dilated in size with <50% respiratory  variability, suggesting right atrial pressure of 15 mmHg.   Patient Profile  70 year old female with history of CKD stage IV, diastolic heart failure, PAD (bilateral SFA CTO's), left renal artery stenosis (90%), diabetes, CAD who was admitted on 12/27/2020 with chest pain and acute hypoxic respiratory failure secondary to pulmonary edema from likely hypertensive crisis.  Course complicated by A. fib with RVR 12/28/2020.   Assessment & Plan   #A. fib with RVR -Maintaining sinus rhythm.  Transition to IV amnio she cannot tolerate orals currently.  Would continue this for now. -Anticoagulation has been held in the setting of hemoglobin drop.  Had nosebleeds as well as gastritis seen on CT scan.  GI has recommended evaluation however her current condition is not stable for that.  She needs other items  addressed. -For now continue IV amiodarone.  #Acute blood loss anemia #Nosebleed #Gastritis -Hold anticoagulation.  GI evaluation at some point.  #Acute hypoxic respiratory failure #CKD stage IV with AKI #Recurrent pulmonary edema #Systolic heart failure, 40 to 45% #Worsening volume overload/hyponatremia/obstructive nephropathy #Left renal artery stenosis -Admitted with recurrent flash pulmonary edema.  She does have left renal artery stenosis and we do have plans to address this at some point however now is not the time. -Now with profound AKI and needing to start dialysis.  Confusion has been an issue plans to do that today per nephrology. -Also found to have obstructive uropathy.  Status post left ureteral stenting.  Her condition continues to decline. -Found to have newly reduced ejection fraction 40-45%.  Suspect this is A. fib related. -For now recommendation is medical management.  She is in need of hemodialysis.  We will not be aggressive with her cardiovascular care until other items have been addressed and are improving. -I stopped her amlodipine.  We will continue metoprolol succinate 50 mg daily, Imdur 30 mg daily, hydralazine 25 mg 3 times daily.  Titrate up as needed. -Not a candidate for ACE/ARB/Arni/MRA given significant CKD.  Not a candidate for SGLT2 inhibitor.  #Elevated troponin -Demand ischemia.  No concerns for ACS.  #Left hip fracture -Per medical team.  For questions or updates, please contact [place] Please consult [email or web address] for contact info under   Time Spent with Patient: I have spent a total of 35 minutes with patient reviewing hospital notes, telemetry, EKGs, labs and examining the patient as well as establishing an assessment and plan that was discussed with the patient.  > 50% of time was spent in direct patient care.    Signed, Addison Naegeli. Audie Box, MD, [place]  01/01/2021 8:49 AM

## 2021-01-01 NOTE — Procedures (Signed)
Interventional Radiology Procedure:   Indications: AKI   Procedure: Placement of right femoral non-tunneled dialysis catheter  Findings: Triple lumen 24 cm dialysis catheter in right common femoral vein, tip in lower IVC region.   Complications: No immediate complications noted.     EBL: Minimal  Plan: Catheter is ready to use.    Lacole Komorowski R. Anselm Pancoast, MD  Pager: [phone]

## 2021-01-02 DIAGNOSIS — J9622 Acute and chronic respiratory failure with hypercapnia: Secondary | ICD-10-CM | POA: Diagnosis not present

## 2021-01-02 DIAGNOSIS — J9621 Acute and chronic respiratory failure with hypoxia: Secondary | ICD-10-CM | POA: Diagnosis not present

## 2021-01-02 LAB — CBC
HCT: 25.4 % — ABNORMAL LOW (ref 36.0–46.0)
Hemoglobin: 8.3 g/dL — ABNORMAL LOW (ref 12.0–15.0)
MCH: 27.2 pg (ref 26.0–34.0)
MCHC: 32.7 g/dL (ref 30.0–36.0)
MCV: 83.3 fL (ref 80.0–100.0)
Platelets: 316 10*3/uL (ref 150–400)
RBC: 3.05 MIL/uL — ABNORMAL LOW (ref 3.87–5.11)
RDW: 15.1 % (ref 11.5–15.5)
WBC: 8.1 10*3/uL (ref 4.0–10.5)
nRBC: 0.2 % (ref 0.0–0.2)

## 2021-01-02 LAB — GLUCOSE, CAPILLARY
Glucose-Capillary: 88 mg/dL (ref 70–99)
Glucose-Capillary: 87 mg/dL (ref 70–99)
Glucose-Capillary: 82 mg/dL (ref 70–99)
Glucose-Capillary: 73 mg/dL (ref 70–99)
Glucose-Capillary: 75 mg/dL (ref 70–99)

## 2021-01-02 MED ORDER — LIDOCAINE 5 % EX PTCH
1.0000 | MEDICATED_PATCH | CUTANEOUS | Status: DC
  Administered 2021-01-02 – 2021-01-16 (×15): 1 via TRANSDERMAL
  Filled 2021-01-02 (×16): qty 1

## 2021-01-02 MED ORDER — HYDRALAZINE HCL 20 MG/ML IJ SOLN: 5.0000 mg | INTRAMUSCULAR | Status: DC | PRN

## 2021-01-02 MED ORDER — MUSCLE RUB 10-15 % EX CREA
TOPICAL_CREAM | CUTANEOUS | Status: DC | PRN
  Filled 2021-01-02: qty 85

## 2021-01-02 NOTE — Progress Notes (Signed)
Cardiology Progress Note  Patient ID: Molly [name] MRN: [id number] DOB: Nov 16, 1950 Date of Encounter: 01/02/2021  Primary Cardiologist: Jenne Campus, MD  Subjective   Chief Complaint: Confusion  HPI: Started on hemodialysis last night.  Left hip fracture will be managed operatively once medical conditions improved.  She is quite confused and altered.  Sleepy and drowsy.  She will wake up but will not follow commands.  ROS:  All other ROS reviewed and negative. Pertinent positives noted in the HPI.     Inpatient Medications  Scheduled Meds:  Chlorhexidine Gluconate Cloth  6 each Topical Q0600   cilostazol  50 mg Oral BID   ezetimibe  10 mg Oral Daily   famotidine  20 mg Oral Daily   hydrALAZINE  25 mg Oral Q8H   insulin aspart  0-6 Units Subcutaneous TID WC   insulin aspart protamine- aspart  8 Units Subcutaneous Q breakfast   isosorbide mononitrate  30 mg Oral Daily   metoprolol succinate  50 mg Oral Daily   pantoprazole (PROTONIX) IV  40 mg Intravenous Daily   rosuvastatin  40 mg Oral Daily   sodium chloride flush  3 mL Intravenous Q12H   Continuous Infusions:  sodium chloride     amiodarone 30 mg/hr (01/02/21 0607)   PRN Meds: sodium chloride, acetaminophen, fentaNYL, heparin sodium (porcine), hydrocortisone cream, HYDROmorphone (DILAUDID) injection, lidocaine, loperamide, loratadine, midazolam, naLOXone (NARCAN)  injection, sodium chloride, sodium chloride flush   Vital Signs   Vitals:   01/01/21 1603 01/01/21 1917 01/01/21 1918 01/02/21 0139  BP: (!) 122/96 (!) 148/62 (!) 148/62 (!) 128/93  Pulse: 82 79 78 77  Resp: 18  (!) 22 16  Temp: 97.8 F (36.6 C) 98 F (36.7 C) 98 F (36.7 C) 98.5 F (36.9 C)  TempSrc: Oral Oral Oral Oral  SpO2: 98% 97% 98% 100%  Weight:    68.7 kg  Height:        Intake/Output Summary (Last 24 hours) at 01/02/2021 0735 Last data filed at 01/01/2021 1459 Gross per 24 hour  Intake 267.76 ml  Output 1000 ml  Net -732.24 ml    Last 3 Weights 01/02/2021 01/01/2021 01/01/2021  Weight (lbs) 151 lb 6.4 oz 150 lb 5.7 oz 153 lb  Weight (kg) 68.675 kg 68.2 kg 69.4 kg      Telemetry  Overnight telemetry shows sinus rhythm in the 70s, which I personally reviewed.   Physical Exam   Vitals:   01/01/21 1603 01/01/21 1917 01/01/21 1918 01/02/21 0139  BP: (!) 122/96 (!) 148/62 (!) 148/62 (!) 128/93  Pulse: 82 79 78 77  Resp: 18  (!) 22 16  Temp: 97.8 F (36.6 C) 98 F (36.7 C) 98 F (36.7 C) 98.5 F (36.9 C)  TempSrc: Oral Oral Oral Oral  SpO2: 98% 97% 98% 100%  Weight:    68.7 kg  Height:        Intake/Output Summary (Last 24 hours) at 01/02/2021 0735 Last data filed at 01/01/2021 1459 Gross per 24 hour  Intake 267.76 ml  Output 1000 ml  Net -732.24 ml    Last 3 Weights 01/02/2021 01/01/2021 01/01/2021  Weight (lbs) 151 lb 6.4 oz 150 lb 5.7 oz 153 lb  Weight (kg) 68.675 kg 68.2 kg 69.4 kg    Body mass index is 27.69 kg/m.   General: Ill-appearing Head: Atraumatic, normal size  Eyes: PEERLA, EOMI  Neck: Supple, no JVD Endocrine: No thryomegaly Cardiac: Normal S1, S2; RRR; no murmurs, rubs, or gallops  Lungs: Crackles at the lung bases Abd: Soft, nontender, no hepatomegaly  Ext: No edema, pulses 2+ Musculoskeletal: No deformities Skin: Warm and dry, no rashes   Neuro: Drowsy, will not follow commands  Labs  High Sensitivity Troponin:   Recent Labs  Lab 12/27/20 0640 12/27/20 0835  TROPONINIHS 397* 376*     Cardiac EnzymesNo results for input(s): TROPONINI in the last 168 hours. No results for input(s): TROPIPOC in the last 168 hours.  Chemistry Recent Labs  Lab 12/30/20 0353 12/31/20 0225 01/01/21 0242  NA 127* 126* 125*  K 3.9 4.4 4.8  CL 85* 84* 84*  CO2 25 22 20*  GLUCOSE 118* 130* 203*  BUN 58* 64* 76*  CREATININE 4.92* 5.56* 6.50*  CALCIUM 8.0* 7.8* 7.5*  ALBUMIN  --   --  2.9*  GFRNONAA 9* 8* 6*  ANIONGAP 17* 20* 21*    Hematology Recent Labs  Lab 12/31/20 0225  01/01/21 0242 01/02/21 0211  WBC 7.3 8.9 8.1  RBC 3.05* 3.18* 3.05*  HGB 8.2* 8.7* 8.3*  HCT 26.6* 26.8* 25.4*  MCV 87.2 84.3 83.3  MCH 26.9 27.4 27.2  MCHC 30.8 32.5 32.7  RDW 14.7 14.8 15.1  PLT 338 355 316   BNP Recent Labs  Lab 12/27/20 0835  BNP 1,102.1*    DDimer No results for input(s): DDIMER in the last 168 hours.   Radiology  CT HIP LEFT WO CONTRAST  Result Date: 12/31/2020 CLINICAL DATA:  Acute limping.  Left hip pain. EXAM: CT OF THE LEFT HIP WITHOUT CONTRAST TECHNIQUE: Multidetector CT imaging of the left hip was performed according to the standard protocol. Multiplanar CT image reconstructions were also generated. COMPARISON:  Radiographs 12/29/2020 FINDINGS: Bones/Joint/Cartilage Acute intertrochanteric fracture of the left hip, not readily appreciable on the recent CT pelvis from 12/29/2020. Minimal comminution along the greater tuberosity portion. No other regional pelvic fracture is observed. Ligaments Suboptimally assessed by CT. Muscles and Tendons Unremarkable Soft tissues Substantial atherosclerotic vascular calcifications. Contrast medium in the urinary bladder and left distal ureter. Small amount of pelvic ascites adjacent to the uterus. Diffuse subcutaneous edema. IMPRESSION: 1. Acute left hip intertrochanteric fracture with minimal comminution along the greater trochanter. 2. Diffuse subcutaneous edema.  Small amount of ascites. 3. Atherosclerosis. Electronically Signed   By: Van Clines M.D.   On: 12/31/2020 15:20   IR Fluoro Guide CV Line Right  Result Date: 01/01/2021 INDICATION: 70 year old with acute renal failure. Patient needs a non tunneled dialysis catheter. EXAM: FLUOROSCOPIC AND ULTRASOUND GUIDED PLACEMENT OF A NON-TUNNELED DIALYSIS CATHETER Physician: Stephan Minister. Anselm Pancoast, MD MEDICATIONS: Moderate sedation ANESTHESIA/SEDATION: Versed 0.5 mg IV; Fentanyl 25 mcg IV; Moderate Sedation Time:  10 minutes The patient was continuously monitored during the  procedure by the interventional radiology nurse under my direct supervision. FLUOROSCOPY TIME:  Fluoroscopy Time: 6 seconds, 1 mGy COMPLICATIONS: None immediate. PROCEDURE: Informed consent was obtained for catheter placement. The patient was placed supine on the interventional table. Ultrasound confirmed a patent right common femoral vein. Ultrasound images were obtained for documentation. The right groin was prepped and draped in a sterile fashion. The right groin was anesthetized with 1% lidocaine. Maximal barrier sterile technique was utilized including caps, mask, sterile gowns, sterile gloves, sterile drape, hand hygiene and skin antiseptic. A small incision was made with #11 blade scalpel. A 21 gauge needle directed into the right common femoral vein with ultrasound guidance. A micropuncture dilator set was placed. A 24 cm Mahurkar catheter was selected. The catheter was advanced  over a wire and positioned in the lower IVC region. Fluoroscopic images were obtained for documentation. Both dialysis lumens were found to aspirate and flush well. The proper amount of heparin was flushed in both lumens. The central venous lumen was flushed with normal saline. Catheter was sutured to skin. FINDINGS: Catheter tip in the lower IVC region. IMPRESSION: Successful placement of a right femoral non-tunneled dialysis catheter using ultrasound and fluoroscopic guidance. Electronically Signed   By: Markus Daft M.D.   On: 01/01/2021 15:30   IR US Guide Vasc Access Right  Result Date: 01/01/2021 INDICATION: 70 year old with acute renal failure. Patient needs a non tunneled dialysis catheter. EXAM: FLUOROSCOPIC AND ULTRASOUND GUIDED PLACEMENT OF A NON-TUNNELED DIALYSIS CATHETER Physician: Stephan Minister. Anselm Pancoast, MD MEDICATIONS: Moderate sedation ANESTHESIA/SEDATION: Versed 0.5 mg IV; Fentanyl 25 mcg IV; Moderate Sedation Time:  10 minutes The patient was continuously monitored during the procedure by the interventional radiology nurse  under my direct supervision. FLUOROSCOPY TIME:  Fluoroscopy Time: 6 seconds, 1 mGy COMPLICATIONS: None immediate. PROCEDURE: Informed consent was obtained for catheter placement. The patient was placed supine on the interventional table. Ultrasound confirmed a patent right common femoral vein. Ultrasound images were obtained for documentation. The right groin was prepped and draped in a sterile fashion. The right groin was anesthetized with 1% lidocaine. Maximal barrier sterile technique was utilized including caps, mask, sterile gowns, sterile gloves, sterile drape, hand hygiene and skin antiseptic. A small incision was made with #11 blade scalpel. A 21 gauge needle directed into the right common femoral vein with ultrasound guidance. A micropuncture dilator set was placed. A 24 cm Mahurkar catheter was selected. The catheter was advanced over a wire and positioned in the lower IVC region. Fluoroscopic images were obtained for documentation. Both dialysis lumens were found to aspirate and flush well. The proper amount of heparin was flushed in both lumens. The central venous lumen was flushed with normal saline. Catheter was sutured to skin. FINDINGS: Catheter tip in the lower IVC region. IMPRESSION: Successful placement of a right femoral non-tunneled dialysis catheter using ultrasound and fluoroscopic guidance. Electronically Signed   By: Markus Daft M.D.   On: 01/01/2021 15:30    Cardiac Studies  TTE 12/28/2020 1. Compared with the echo A999333, systolic function is reduced. Left  ventricular ejection fraction, by estimation, is 40 to 45%. The left  ventricle has mildly decreased function. The left ventricle demonstrates  global hypokinesis. Left ventricular  diastolic parameters are indeterminate.   2. Right ventricular systolic function is normal. The right ventricular  size is normal. There is moderately elevated pulmonary artery systolic  pressure.   3. The pericardial effusion is circumferential.    4. The mitral valve is normal in structure. Mild mitral valve  regurgitation. No evidence of mitral stenosis.   5. The aortic valve is tricuspid. There is mild calcification of the  aortic valve. There is mild thickening of the aortic valve. Aortic valve  regurgitation is not visualized. No aortic stenosis is present.   6. The inferior vena cava is dilated in size with <50% respiratory  variability, suggesting right atrial pressure of 15 mmHg.   Patient Profile  70 year old female with history of CKD stage IV, diastolic heart failure, PAD (bilateral SFA CTO's), left renal artery stenosis (90%), diabetes, CAD who was admitted on 12/27/2020 with chest pain and acute hypoxic respiratory failure secondary to pulmonary edema from likely hypertensive crisis.  Course complicated by A. fib with RVR 12/28/2020.   Assessment & Plan   #  A. fib with RVR -Amiodarone use due to difficult to control rates.  Maintaining sinus rhythm. -Cannot tolerate p.o. currently due to mental status.  Recommend to continue amiodarone drip until she is able to tolerate orals. -Had nosebleeds with drop in hemoglobin.  Also gastritis on CT.  Hold anticoagulation until GI evaluation has been completed.  Suspect this will be several days away as her mental status is very tenuous.  #Acute blood loss anemia #Nosebleed #Gastritis -Hemoglobin did drop on anticoagulation.  Had nosebleeds as well as gastritis on CT scan.  Hold full dose anticoagulation.  DVT prophylaxis is okay.  #Acute hypoxic respiratory failure #CKD stage IV with AKI #Recurrent flash pulmonary edema #Systolic heart failure, A999333 #Volume overload/hyponatremia/obstructive uropathy #Left renal artery stenosis -Admitted with flash pulmonary edema and hypertensive crisis.  This has been an ongoing issue for her. -Left renal artery stenosis that will need to be addressed at some point.  Now is not the time. -Now with profound AKI and on hemodialysis.  We will  continue with dialysis management per nephrology.  This is maintaining her volume status. -EF now 40-45.  Suspect this is A. fib related.  For now we will continue with medical management. -Continue metoprolol succinate 50 mg daily, Imdur 30 mg daily, hydralazine 25 mg 3 times daily.  Not a candidate for ACE/ARB/Arni/MRA given significant CKD.  Not a candidate for SGLT2 inhibitor.  #Elevated troponin -Demand in the setting of critical illness.  Not concern for ACS.  #Left hip fracture -Suffered a fall this admission.  Will need operative management once her medical condition has improved.  For questions or updates, please contact [place] Please consult [email or web address] for contact info under   Time Spent with Patient: I have spent a total of 25 minutes with patient reviewing hospital notes, telemetry, EKGs, labs and examining the patient as well as establishing an assessment and plan that was discussed with the patient.  > 50% of time was spent in direct patient care.    Signed, Addison Naegeli. Audie Box, MD, [place]  01/02/2021 7:35 AM

## 2021-01-02 NOTE — Progress Notes (Signed)
Progress Note Hospital Day: 7  Chief Complaint:   anemia      ASSESSMENT AND PLAN   # 70 yo female with [place] anemia on anticoagulation ( started this admission) without overt GI blood loss. Baseline hgb 11-12, now down to 8.3 ( but stable). Anti-coagulation on hold.  Non contrast CT scan  suggesting gastroduodenitis but in absence of any overt GI bleeding. Epistaxis has resolved off anticoagulation.  --Patient still with altered mental status in setting of uremia.  --Anemia is probably multifactorial. When her mental status improves,  or if she has overt GI bleeding then we can can pursue upper endoscopic.  --She is up to date on screening colonoscopy  # Gross hematuria. CT scan 9/14 shows left renal stone causing obstruction. She had a cystoscopy with left ureteral stent placement on 12/30/2020.     # Acute on chronic respiratory failure     # Fall in hospital with resulting left hip fracture. Orthopedic Surgery following, patient not stable for surgery right now.    # AKI on CKD in setting of HTN crisis, RAS, Afib w/ RVR, mildly obstructive left ureteral stone s/p stent placement. Nephrology following, creatinine continues to worsen.  --She had HD yesterday, scheduled for a second session tomorrow and another on Tuesday.    # ? Pancreatic insufficiency. Pancreatic elastase 104.    SUBJECTIVE    Confused.    OBJECTIVE      Scheduled inpatient medications:   Chlorhexidine Gluconate Cloth  6 each Topical Q0600   cilostazol  50 mg Oral BID   ezetimibe  10 mg Oral Daily   famotidine  20 mg Oral Daily   hydrALAZINE  25 mg Oral Q8H   insulin aspart  0-6 Units Subcutaneous TID WC   insulin aspart protamine- aspart  8 Units Subcutaneous Q breakfast   isosorbide mononitrate  30 mg Oral Daily   metoprolol succinate  50 mg Oral Daily   pantoprazole (PROTONIX) IV  40 mg Intravenous Daily   rosuvastatin  40 mg Oral Daily   sodium chloride flush  3 mL Intravenous Q12H    Continuous inpatient infusions:   sodium chloride     amiodarone 30 mg/hr (01/02/21 0607)   PRN inpatient medications: sodium chloride, acetaminophen, fentaNYL, heparin sodium (porcine), hydrocortisone cream, HYDROmorphone (DILAUDID) injection, lidocaine, loperamide, loratadine, midazolam, naLOXone (NARCAN)  injection, sodium chloride, sodium chloride flush  Vital signs in last 24 hours: Temp:  [98 F (36.7 C)-98.5 F (36.9 C)] 98.2 F (36.8 C) (09/18 1141) Pulse Rate:  [75-79] 75 (09/18 1141) Resp:  [16-22] 16 (09/18 1141) BP: (128-148)/(60-93) 147/60 (09/18 1141) SpO2:  [95 %-100 %] 95 % (09/18 1141) Weight:  [68.7 kg] 68.7 kg (09/18 0139) Last BM Date: 12/28/20  Intake/Output Summary (Last 24 hours) at 01/02/2021 1632 Last data filed at 01/02/2021 1500 Gross per 24 hour  Intake 0 ml  Output --  Net 0 ml     Physical Exam:  General: Alert, confused female. Husband in room Heart:  Regular rate and rhythm.  Pulmonary: Normal respiratory effort Abdomen: Soft, nondistended, signs of moderate mid upper abdominal tenderness. Normal bowel sounds.  Neurologic: Confused. Doesn't want to be examined.   Filed Weights   01/01/21 1252 01/01/21 1459 01/02/21 0139  Weight: 69.4 kg 68.2 kg 68.7 kg    Intake/Output from previous day: 09/17 0701 - 09/18 0700 In: 267.8 [I.V.:267.8] Out: 1000  Intake/Output this shift: No intake/output data recorded.    Lab Results: Recent  Labs    12/31/20 0225 01/01/21 0242 01/02/21 0211  WBC 7.3 8.9 8.1  HGB 8.2* 8.7* 8.3*  HCT 26.6* 26.8* 25.4*  PLT 338 355 316   BMET Recent Labs    12/31/20 0225 01/01/21 0242  NA 126* 125*  K 4.4 4.8  CL 84* 84*  CO2 22 20*  GLUCOSE 130* 203*  BUN 64* 76*  CREATININE 5.56* 6.50*  CALCIUM 7.8* 7.5*   LFT Recent Labs    01/01/21 0242  ALBUMIN 2.9*   PT/INR No results for input(s): LABPROT, INR in the last 72 hours. Hepatitis Panel Recent Labs    12/31/20 1141  HEPBSAG NON  REACTIVE    IR Fluoro Guide CV Line Right  Result Date: 01/01/2021 INDICATION: 70 year old with acute renal failure. Patient needs a non tunneled dialysis catheter. EXAM: FLUOROSCOPIC AND ULTRASOUND GUIDED PLACEMENT OF A NON-TUNNELED DIALYSIS CATHETER Physician: Stephan Minister. Anselm Pancoast, MD MEDICATIONS: Moderate sedation ANESTHESIA/SEDATION: Versed 0.5 mg IV; Fentanyl 25 mcg IV; Moderate Sedation Time:  10 minutes The patient was continuously monitored during the procedure by the interventional radiology nurse under my direct supervision. FLUOROSCOPY TIME:  Fluoroscopy Time: 6 seconds, 1 mGy COMPLICATIONS: None immediate. PROCEDURE: Informed consent was obtained for catheter placement. The patient was placed supine on the interventional table. Ultrasound confirmed a patent right common femoral vein. Ultrasound images were obtained for documentation. The right groin was prepped and draped in a sterile fashion. The right groin was anesthetized with 1% lidocaine. Maximal barrier sterile technique was utilized including caps, mask, sterile gowns, sterile gloves, sterile drape, hand hygiene and skin antiseptic. A small incision was made with #11 blade scalpel. A 21 gauge needle directed into the right common femoral vein with ultrasound guidance. A micropuncture dilator set was placed. A 24 cm Mahurkar catheter was selected. The catheter was advanced over a wire and positioned in the lower IVC region. Fluoroscopic images were obtained for documentation. Both dialysis lumens were found to aspirate and flush well. The proper amount of heparin was flushed in both lumens. The central venous lumen was flushed with normal saline. Catheter was sutured to skin. FINDINGS: Catheter tip in the lower IVC region. IMPRESSION: Successful placement of a right femoral non-tunneled dialysis catheter using ultrasound and fluoroscopic guidance. Electronically Signed   By: Markus Daft M.D.   On: 01/01/2021 15:30   IR US Guide Vasc Access  Right  Result Date: 01/01/2021 INDICATION: 70 year old with acute renal failure. Patient needs a non tunneled dialysis catheter. EXAM: FLUOROSCOPIC AND ULTRASOUND GUIDED PLACEMENT OF A NON-TUNNELED DIALYSIS CATHETER Physician: Stephan Minister. Anselm Pancoast, MD MEDICATIONS: Moderate sedation ANESTHESIA/SEDATION: Versed 0.5 mg IV; Fentanyl 25 mcg IV; Moderate Sedation Time:  10 minutes The patient was continuously monitored during the procedure by the interventional radiology nurse under my direct supervision. FLUOROSCOPY TIME:  Fluoroscopy Time: 6 seconds, 1 mGy COMPLICATIONS: None immediate. PROCEDURE: Informed consent was obtained for catheter placement. The patient was placed supine on the interventional table. Ultrasound confirmed a patent right common femoral vein. Ultrasound images were obtained for documentation. The right groin was prepped and draped in a sterile fashion. The right groin was anesthetized with 1% lidocaine. Maximal barrier sterile technique was utilized including caps, mask, sterile gowns, sterile gloves, sterile drape, hand hygiene and skin antiseptic. A small incision was made with #11 blade scalpel. A 21 gauge needle directed into the right common femoral vein with ultrasound guidance. A micropuncture dilator set was placed. A 24 cm Mahurkar catheter was selected. The catheter was advanced  over a wire and positioned in the lower IVC region. Fluoroscopic images were obtained for documentation. Both dialysis lumens were found to aspirate and flush well. The proper amount of heparin was flushed in both lumens. The central venous lumen was flushed with normal saline. Catheter was sutured to skin. FINDINGS: Catheter tip in the lower IVC region. IMPRESSION: Successful placement of a right femoral non-tunneled dialysis catheter using ultrasound and fluoroscopic guidance. Electronically Signed   By: Markus Daft M.D.   On: 01/01/2021 15:30        Principal Problem:   Acute on chronic respiratory failure with  hypoxia and hypercapnia (HCC) Active Problems:   HTN (hypertension)   Type 2 diabetes mellitus with stage 3b chronic kidney disease, with long-term current use of insulin (HCC)   Elevated troponin   Acute on chronic heart failure with preserved ejection fraction (HFpEF) ([place])   CAP (community acquired pneumonia)   Anemia of chronic disease     LOS: 6 days   Tye Savoy ,NP 01/02/2021, 4:32 PM

## 2021-01-02 NOTE — Progress Notes (Signed)
PROGRESS NOTE    Molly [name]  [id number] DOB: 29-Mar-1951 DOA: 12/27/2020 PCP: [name], MD   Brief Narrative:  70 year old female with past medical history of hypertension, left renal artery stenosis ([id number]) diastolic CHF, CKD stage IV, CAD, PAD with bilateral SFA CTO's, anemia chronic disease diabetes presented on 12/27/2020 with 4 days of chest pain.  BP was uncontrolled and spiked as high as 234/208 in the ED.  Chest x-ray consistent with pulmonary edema.  Patient briefly required BiPAP.   Admitted to [facility] service with cardiology consulted. Patient developed A. fib with RVR on 9/13.  Assessment & Plan:   Principal Problem:   Acute on chronic respiratory failure with hypoxia and hypercapnia (HCC) Active Problems:   HTN (hypertension)   Type 2 diabetes mellitus with stage 3b chronic kidney disease, with long-term current use of insulin (HCC)   Elevated troponin   Acute on chronic heart failure with preserved ejection fraction (HFpEF) ([place])   CAP (community acquired pneumonia)   Anemia of chronic disease  Acute metabolic/uremic encephalopathy: Patient slightly more awake and talkative today since she had first session of dialysis yesterday.  Hopefully she will continue to improve with more sessions of dialysis as scheduled tomorrow and day after.  Acute respiratory failure with hypoxia secondary to recurrent flash pulmonary edema/acute systolic congestive heart failure: Echo shows reduced ejection fraction to 40 to 45% compared to the previous 1.  Cardiology on board and managing.  Due to rising creatinine, she is on diuretics holiday.  No plans for diuretics per cardiology.  Plan for hemodialysis today.  Fall in hospital/left hip fracture: CT left hip indicates left hip fracture.  Orthopedics on board.  Plan for surgical repair once she is more stable.  Hypertensive emergency with chest pain: No chest pain and blood pressure controlled now.  Continue amlodipine 10 mg  p.o. daily, hydralazine 25 mg p.o. 3 times daily Imdur to 30 mg p.o. daily, Toprol-XL 50 mg p.o. daily.  Left renal artery stenosis: Has a history of 80 to 90% of proximal left renal artery stenosis.  Plan was to do angiogram and stent but due to rising creatinine, this plan is on hold for now.  Cardiology and nephrology managing.  New onset A. fib with RVR: Developed overnight 12/28/2020.  She was initially started on amiodarone drip and converted back to sinus rhythm.  Transitioned to oral amiodarone but now that she is unable to take anything p.o., she has been transitioned back to IV amiodarone.  Not on any anticoagulation due to slight drop in hemoglobin.  Possible gastroduodenitis: CT abdomen suggests possible gastroduodenitis.  Seen by GI.  They recommend EGD which patient is not stable for that procedure.  Prior to his hemodialysis.  She has been started on Protonix.  Will eventually need EGD once more stable.  Defer to GI.  Epistaxis: Nosebleed overnight 12/30/2018, no further episodes.  Heparin was stopped and has remained on hold since then.  AKI on CKD stage IV/progressive renal failure/obstructive uropathy/left ureteral stone/left hydronephrosis/dross hematuria: CT renal stone yesterday shows 6 mm of left ureteral stone causing obstruction/hydronephrosis.  Patient underwent cystoscopy with left ureteral stent placement on 12/30/2020.  Now has some gross hematuria.  Hyponatremia: No labs today.  We will recheck tomorrow.  Community-acquired pneumonia: Completed 5 days of Rocephin on 12/31/2020.  Elevated troponin/demand ischemia: In the setting of hypertensive crisis.  EKG without ischemic changes.  Likely demand ischemia. 397 > 376.  Cardiology on board.  No plan for intervention.  Type 2 diabetes mellitus: Recent hemoglobin A1c 8.2% on 11/27/2020.  Home regimen includes Humulin 70/30 mix 8 to 12 units daily.  Currently on 8 units here and SSI.  Blood sugar controlled.  Continue current  regimen.  Dyslipidemia: Continue Zetia and Crestor.  PAD: Continue statin and Pletal.  Anemia of chronic disease: Hemoglobin has remained stable since yesterday around 8.2.  CT renal stone indicates possible gastritis or gastric ulcer.  Cardiology has consulted GI.  Continue Protonix.  GERD: Continue PPI.  Moderate to severe protein calorie malnutrition: Patient looks malnourished and she has not had p.o. intake since last 2 to 3 days.  Concerned about proper calorie intake.  We will consult nutrition to determine if this is a time to start tube feedings.  DVT prophylaxis:    Code Status: Full Code  Family Communication: Patient's husband present at bedside.  Plan of care discussed with him in length.    Status is: Inpatient  Remains inpatient appropriate because:Ongoing diagnostic testing needed not appropriate for outpatient work up  Dispo: The patient is from: Home              Anticipated d/c is to: Home              Patient currently is not medically stable to d/c.   Difficult to place patient No        Estimated body mass index is 27.69 kg/m as calculated from the following:   Height as of this encounter: '5\' 2"'$  (1.575 m).   Weight as of this encounter: 68.7 kg.     Nutritional Assessment: Body mass index is 27.69 kg/m.Marland Kitchen Seen by dietician.  I agree with the assessment and plan as outlined below: Nutrition Status:       Skin Assessment: I have examined the patient's skin and I agree with the wound assessment as performed by the wound care RN as outlined below:    Consultants:  Nephrology Cardiology Orthopedic Urology  Procedures:  As above  Antimicrobials:  Anti-infectives (From admission, onward)    Start     Dose/Rate Route Frequency Ordered Stop   12/31/20 1330  cefTRIAXone (ROCEPHIN) 1 g in sodium chloride 0.9 % 100 mL IVPB        1 g 200 mL/hr over 30 Minutes Intravenous  Once 12/31/20 1315 12/31/20 1350   12/31/20 1000  cefTRIAXone  (ROCEPHIN) 1 g in sodium chloride 0.9 % 100 mL IVPB  Status:  Discontinued        1 g 200 mL/hr over 30 Minutes Intravenous Every 24 hours 12/30/20 1202 12/31/20 1315   12/27/20 1530  cefTRIAXone (ROCEPHIN) 2 g in sodium chloride 0.9 % 100 mL IVPB  Status:  Discontinued        2 g 200 mL/hr over 30 Minutes Intravenous Every 24 hours 12/27/20 1435 12/30/20 1202          Subjective: Patient seen and examined.  Husband at the bedside.  Per husband, patient is doing better today, she in fact had a few small conversations with him this morning but according to him, she is in and out of confusion.  While I was there, patient was lethargic but easily arousable.  Slightly more talkative.  In fact, she even said "thank you" after the end of my encounter with her today.  Objective: Vitals:   01/01/21 1603 01/01/21 1917 01/01/21 1918 01/02/21 0139  BP: (!) 122/96 (!) 148/62 (!) 148/62 (!) 128/93  Pulse: 82 79 78 77  Resp: 18  (!) 22 16  Temp: 97.8 F (36.6 C) 98 F (36.7 C) 98 F (36.7 C) 98.5 F (36.9 C)  TempSrc: Oral Oral Oral Oral  SpO2: 98% 97% 98% 100%  Weight:    68.7 kg  Height:        Intake/Output Summary (Last 24 hours) at 01/02/2021 1108 Last data filed at 01/01/2021 1459 Gross per 24 hour  Intake 267.76 ml  Output 1000 ml  Net -732.24 ml    Filed Weights   01/01/21 1252 01/01/21 1459 01/02/21 0139  Weight: 69.4 kg 68.2 kg 68.7 kg    Examination: General exam: Appears lethargic but arousable Respiratory system: Diminished breath sounds. Respiratory effort normal. Cardiovascular system: S1 & S2 heard, RRR. No JVD, murmurs, rubs, gallops or clicks. No pedal edema. Gastrointestinal system: Abdomen is nondistended, soft and slightly tender. No organomegaly or masses felt. Normal bowel sounds heard. Central nervous system: Lethargic and not oriented.  No focal deficit.   Data Reviewed: I have personally reviewed following labs and imaging studies  CBC: Recent Labs   Lab 12/29/20 0332 12/30/20 0353 12/31/20 0225 01/01/21 0242 01/02/21 0211  WBC 9.0 8.5 7.3 8.9 8.1  HGB 9.0* 8.3* 8.2* 8.7* 8.3*  HCT 27.6* 25.9* 26.6* 26.8* 25.4*  MCV 83.6 85.2 87.2 84.3 83.3  PLT 296 316 338 355 [id number]    Basic Metabolic Panel: Recent Labs  Lab 12/28/20 0336 12/29/20 0332 12/30/20 0353 12/31/20 0225 01/01/21 0242  NA 129* 126* 127* 126* 125*  K 3.5 3.7 3.9 4.4 4.8  CL 87* 84* 85* 84* 84*  CO2 '31 26 25 22 '$ 20*  GLUCOSE 177* 160* 118* 130* 203*  BUN 44* 49* 58* 64* 76*  CREATININE 3.79* 4.28* 4.92* 5.56* 6.50*  CALCIUM 8.7* 8.2* 8.0* 7.8* 7.5*  MG  --  2.1  --   --   --   PHOS  --   --   --   --  6.7*    GFR: Estimated Creatinine Clearance: 7.5 mL/min (A) (by C-G formula based on SCr of 6.5 mg/dL (H)). Liver Function Tests: Recent Labs  Lab 01/01/21 0242  ALBUMIN 2.9*    No results for input(s): LIPASE, AMYLASE in the last 168 hours. No results for input(s): AMMONIA in the last 168 hours. Coagulation Profile: No results for input(s): INR, PROTIME in the last 168 hours. Cardiac Enzymes: No results for input(s): CKTOTAL, CKMB, CKMBINDEX, TROPONINI in the last 168 hours. BNP (last 3 results) No results for input(s): PROBNP in the last 8760 hours. HbA1C: No results for input(s): HGBA1C in the last 72 hours. CBG: Recent Labs  Lab 01/01/21 1124 01/01/21 1605 01/01/21 2111 01/02/21 0153 01/02/21 0534  GLUCAP 174* 84 71 88 87    Lipid Profile: No results for input(s): CHOL, HDL, LDLCALC, TRIG, CHOLHDL, LDLDIRECT in the last 72 hours. Thyroid Function Tests: No results for input(s): TSH, T4TOTAL, FREET4, T3FREE, THYROIDAB in the last 72 hours. Anemia Panel: No results for input(s): VITAMINB12, FOLATE, FERRITIN, TIBC, IRON, RETICCTPCT in the last 72 hours. Sepsis Labs: Recent Labs  Lab 12/27/20 1130  PROCALCITON 0.39     Recent Results (from the past 240 hour(s))  Resp Panel by RT-PCR (Flu A&B, Covid) Nasopharyngeal Swab     Status:  None   Collection Time: 12/27/20  8:53 AM   Specimen: Nasopharyngeal Swab; Nasopharyngeal(NP) swabs in vial transport medium  Result Value Ref Range Status   SARS Coronavirus 2 by RT PCR NEGATIVE NEGATIVE Final  Comment: (NOTE) SARS-CoV-2 target nucleic acids are NOT DETECTED.  The SARS-CoV-2 RNA is generally detectable in upper respiratory specimens during the acute phase of infection. The lowest concentration of SARS-CoV-2 viral copies this assay can detect is 138 copies/mL. A negative result does not preclude SARS-Cov-2 infection and should not be used as the sole basis for treatment or other patient management decisions. A negative result may occur with  improper specimen collection/handling, submission of specimen other than nasopharyngeal swab, presence of viral mutation(s) within the areas targeted by this assay, and inadequate number of viral copies(<138 copies/mL). A negative result must be combined with clinical observations, patient history, and epidemiological information. The expected result is Negative.  Fact Sheet for Patients:  EntrepreneurPulse.com.au  Fact Sheet for Healthcare Providers:  IncredibleEmployment.be  This test is no t yet approved or cleared by the [place] FDA and  has been authorized for detection and/or diagnosis of SARS-CoV-2 by FDA under an Emergency Use Authorization (EUA). This EUA will remain  in effect (meaning this test can be used) for the duration of the COVID-19 declaration under Section 564(b)(1) of the Act, 21 U.S.C.section 360bbb-3(b)(1), unless the authorization is terminated  or revoked sooner.       Influenza A by PCR NEGATIVE NEGATIVE Final   Influenza B by PCR NEGATIVE NEGATIVE Final    Comment: (NOTE) The Xpert Xpress SARS-CoV-2/FLU/RSV plus assay is intended as an aid in the diagnosis of influenza from Nasopharyngeal swab specimens and should not be used as a sole basis for  treatment. Nasal washings and aspirates are unacceptable for Xpert Xpress SARS-CoV-2/FLU/RSV testing.  Fact Sheet for Patients: EntrepreneurPulse.com.au  Fact Sheet for Healthcare Providers: IncredibleEmployment.be  This test is not yet approved or cleared by the [place] FDA and has been authorized for detection and/or diagnosis of SARS-CoV-2 by FDA under an Emergency Use Authorization (EUA). This EUA will remain in effect (meaning this test can be used) for the duration of the COVID-19 declaration under Section 564(b)(1) of the Act, 21 U.S.C. section 360bbb-3(b)(1), unless the authorization is terminated or revoked.  Performed at [facility], [place] [address], [place], [place] [address]        Radiology Studies: CT HIP LEFT WO CONTRAST  Result Date: 12/31/2020 CLINICAL DATA:  Acute limping.  Left hip pain. EXAM: CT OF THE LEFT HIP WITHOUT CONTRAST TECHNIQUE: Multidetector CT imaging of the left hip was performed according to the standard protocol. Multiplanar CT image reconstructions were also generated. COMPARISON:  Radiographs 12/29/2020 FINDINGS: Bones/Joint/Cartilage Acute intertrochanteric fracture of the left hip, not readily appreciable on the recent CT pelvis from 12/29/2020. Minimal comminution along the greater tuberosity portion. No other regional pelvic fracture is observed. Ligaments Suboptimally assessed by CT. Muscles and Tendons Unremarkable Soft tissues Substantial atherosclerotic vascular calcifications. Contrast medium in the urinary bladder and left distal ureter. Small amount of pelvic ascites adjacent to the uterus. Diffuse subcutaneous edema. IMPRESSION: 1. Acute left hip intertrochanteric fracture with minimal comminution along the greater trochanter. 2. Diffuse subcutaneous edema.  Small amount of ascites. 3. Atherosclerosis. Electronically Signed   By: Van Clines M.D.   On: 12/31/2020 15:20   IR Fluoro  Guide CV Line Right  Result Date: 01/01/2021 INDICATION: 70 year old with acute renal failure. Patient needs a non tunneled dialysis catheter. EXAM: FLUOROSCOPIC AND ULTRASOUND GUIDED PLACEMENT OF A NON-TUNNELED DIALYSIS CATHETER Physician: Stephan Minister. Anselm Pancoast, MD MEDICATIONS: Moderate sedation ANESTHESIA/SEDATION: Versed 0.5 mg IV; Fentanyl 25 mcg IV; Moderate Sedation Time:  10 minutes  The patient was continuously monitored during the procedure by the interventional radiology nurse under my direct supervision. FLUOROSCOPY TIME:  Fluoroscopy Time: 6 seconds, 1 mGy COMPLICATIONS: None immediate. PROCEDURE: Informed consent was obtained for catheter placement. The patient was placed supine on the interventional table. Ultrasound confirmed a patent right common femoral vein. Ultrasound images were obtained for documentation. The right groin was prepped and draped in a sterile fashion. The right groin was anesthetized with 1% lidocaine. Maximal barrier sterile technique was utilized including caps, mask, sterile gowns, sterile gloves, sterile drape, hand hygiene and skin antiseptic. A small incision was made with #11 blade scalpel. A 21 gauge needle directed into the right common femoral vein with ultrasound guidance. A micropuncture dilator set was placed. A 24 cm Mahurkar catheter was selected. The catheter was advanced over a wire and positioned in the lower IVC region. Fluoroscopic images were obtained for documentation. Both dialysis lumens were found to aspirate and flush well. The proper amount of heparin was flushed in both lumens. The central venous lumen was flushed with normal saline. Catheter was sutured to skin. FINDINGS: Catheter tip in the lower IVC region. IMPRESSION: Successful placement of a right femoral non-tunneled dialysis catheter using ultrasound and fluoroscopic guidance. Electronically Signed   By: Markus Daft M.D.   On: 01/01/2021 15:30   IR US Guide Vasc Access Right  Result Date:  01/01/2021 INDICATION: 70 year old with acute renal failure. Patient needs a non tunneled dialysis catheter. EXAM: FLUOROSCOPIC AND ULTRASOUND GUIDED PLACEMENT OF A NON-TUNNELED DIALYSIS CATHETER Physician: Stephan Minister. Anselm Pancoast, MD MEDICATIONS: Moderate sedation ANESTHESIA/SEDATION: Versed 0.5 mg IV; Fentanyl 25 mcg IV; Moderate Sedation Time:  10 minutes The patient was continuously monitored during the procedure by the interventional radiology nurse under my direct supervision. FLUOROSCOPY TIME:  Fluoroscopy Time: 6 seconds, 1 mGy COMPLICATIONS: None immediate. PROCEDURE: Informed consent was obtained for catheter placement. The patient was placed supine on the interventional table. Ultrasound confirmed a patent right common femoral vein. Ultrasound images were obtained for documentation. The right groin was prepped and draped in a sterile fashion. The right groin was anesthetized with 1% lidocaine. Maximal barrier sterile technique was utilized including caps, mask, sterile gowns, sterile gloves, sterile drape, hand hygiene and skin antiseptic. A small incision was made with #11 blade scalpel. A 21 gauge needle directed into the right common femoral vein with ultrasound guidance. A micropuncture dilator set was placed. A 24 cm Mahurkar catheter was selected. The catheter was advanced over a wire and positioned in the lower IVC region. Fluoroscopic images were obtained for documentation. Both dialysis lumens were found to aspirate and flush well. The proper amount of heparin was flushed in both lumens. The central venous lumen was flushed with normal saline. Catheter was sutured to skin. FINDINGS: Catheter tip in the lower IVC region. IMPRESSION: Successful placement of a right femoral non-tunneled dialysis catheter using ultrasound and fluoroscopic guidance. Electronically Signed   By: Markus Daft M.D.   On: 01/01/2021 15:30    Scheduled Meds:  Chlorhexidine Gluconate Cloth  6 each Topical Q0600   cilostazol  50 mg  Oral BID   ezetimibe  10 mg Oral Daily   famotidine  20 mg Oral Daily   hydrALAZINE  25 mg Oral Q8H   insulin aspart  0-6 Units Subcutaneous TID WC   insulin aspart protamine- aspart  8 Units Subcutaneous Q breakfast   isosorbide mononitrate  30 mg Oral Daily   metoprolol succinate  50 mg Oral Daily  pantoprazole (PROTONIX) IV  40 mg Intravenous Daily   rosuvastatin  40 mg Oral Daily   sodium chloride flush  3 mL Intravenous Q12H   Continuous Infusions:  sodium chloride     amiodarone 30 mg/hr (01/02/21 0607)     LOS: 6 days   Time spent: 30 minutes   Darliss Cheney, MD [facility]  01/02/2021, 11:08 AM  Please page via [facility] and do not message via secure chat for anything urgent. Secure chat can be used for anything non urgent and I will respond at my earliest availability.  How to contact the [facility] Attending or Consulting provider [place] or covering provider during after hours [place], for this patient?  Check the care team in [facility] and look for a) attending/consulting [facility] provider listed and b) the [facility] team listed. Page or secure chat [phone]. Log into [email or web address] and use [place]'s universal password to access. If you do not have the password, please contact the hospital operator. Locate the [facility] provider you are looking for under [facility] and page to a number that you can be directly reached. If you still have difficulty reaching the provider, please page the [facility] (Director on Call) for the Hospitalists listed on amion for assistance.

## 2021-01-02 NOTE — Consult Note (Signed)
Reason for Consult: Left intertrochanteric hip fracture Referring Physician: Dr. Shawnie Pons is an 70 y.o. female.  HPI: Patient was admitted with multiple medical problems.  She had a fall while in the restroom and sustained a left intertrochanteric hip fracture.  This was seen on CT scan.  Patient is obtunded with altered mental status.  She had dialysis yesterday and had some improvement per her husband.  He is also discussing renal colliculus and pyelonephritis.  Past Medical History:  Diagnosis Date   Abdominal aortic atherosclerosis with stenosis 06/29/2015   AKI (acute kidney injury) ([place]) 06/29/2015   Atherosclerosis of coronary artery without angina pectoris 04/21/2020   Atopic dermatitis 04/21/2020   Benign hypertension with CKD (chronic kidney disease) stage III ([place]) 04/21/2020   Bilateral impacted cerumen 08/21/2019   Chest pain 08/31/2020   Cholecystitis 06/29/2015   Cholelithiasis 06/29/2015   Claudication in peripheral vascular disease ([place]) 08/28/2019   Peripheral arterial disease   Colon cancer screening 04/21/2020   Dehydration with hyponatremia 06/29/2015   Diabetes mellitus ([place]) 07/15/2018   Diarrhea 04/21/2020   DKA (diabetic ketoacidoses) 06/29/2015   Dyslipidemia 08/30/2018   Elevated troponin    Epigastric pain 04/21/2020   Gastroesophageal reflux disease 04/21/2020   HTN (hypertension) 06/29/2015   Hyperglycemia due to type 2 diabetes mellitus ([place]) 04/21/2020   Hypertension    Hypertensive emergency 07/12/2020   Hypertensive heart disease without congestive heart failure 04/21/2020   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   Proptosis 04/21/2020   Proximal leg weakness 09/10/2018   Ptosis of left eyelid 09/10/2018   Renal artery stenosis ([place]) 12/17/2019   Renal artery stenosis   Standard chest x-ray abnormal 04/21/2020   Vitamin D deficiency 04/21/2020   Weight loss 04/21/2020    Past Surgical History:  Procedure Laterality Date   ABDOMINAL AORTOGRAM W/LOWER EXTREMITY N/A 08/28/2019   Procedure: ABDOMINAL AORTOGRAM W/LOWER EXTREMITY;  Surgeon: Lorretta Harp, MD;  Location: [facility];  Service: Cardiovascular;  Laterality: N/A;   CATARACT EXTRACTION, BILATERAL     CHOLECYSTECTOMY N/A 06/30/2015   Procedure: LAPAROSCOPIC CHOLECYSTECTOMY WITH INTRAOPERATIVE CHOLANGIOGRAM;  Surgeon: Donnie Mesa, MD;  Location: [facility];  Service: General;  Laterality: N/A;   CYSTOSCOPY W/ URETERAL STENT PLACEMENT Left 12/30/2020   Procedure: CYSTOSCOPY WITH RETROGRADE PYELOGRAM/URETERAL STENT PLACEMENT;  Surgeon: Janith Lima, MD;  Location: [facility];  Service: Urology;  Laterality: Left;   IR FLUORO GUIDE CV LINE RIGHT  01/01/2021   IR US GUIDE VASC ACCESS RIGHT  01/01/2021   PERIPHERAL VASCULAR BALLOON ANGIOPLASTY Right 08/28/2019   Procedure: PERIPHERAL VASCULAR BALLOON ANGIOPLASTY;  Surgeon: Lorretta Harp, MD;  Location: [facility];  Service: Cardiovascular;  Laterality: Right;  attempted SFA    Family History  Problem Relation Age of Onset   Breast cancer Mother    Hypertension Mother    Hypertension Father    Pancreatic cancer Other  uncle    Social History:  reports that she has never smoked. She has never used smokeless tobacco. She reports that she does not drink alcohol and does not use drugs.  Allergies:  Allergies  Allergen Reactions   Lisinopril Other (See Comments)   Mercury Nausea And Vomiting     Medications: I have reviewed the patient's current medications.  Results for orders placed or performed during the hospital encounter of 12/27/20 (from the past 48 hour(s))  Glucose, capillary     Status: Abnormal   Collection Time: 12/31/20  9:10 AM  Result Value Ref Range   Glucose-Capillary 151 (H) 70 - 99 mg/dL    Comment: Glucose reference range applies only to samples taken after fasting for at least 8 hours.  Glucose, capillary     Status: Abnormal   Collection Time: 12/31/20 11:31 AM  Result Value Ref Range   Glucose-Capillary 172 (H) 70 - 99 mg/dL    Comment: Glucose reference range applies only to samples taken after fasting for at least 8 hours.  Hepatitis B surface antigen     Status: None   Collection Time: 12/31/20 11:41 AM  Result Value Ref Range   Hepatitis B Surface Ag NON REACTIVE NON REACTIVE    Comment: Performed at [facility] [address] [place], [place] [address]  Hepatitis B surface antibody     Status: Abnormal   Collection Time: 12/31/20 11:41 AM  Result Value Ref Range   Hep B S Ab Reactive (A) NON REACTIVE    Comment: (NOTE) Consistent with immunity, greater than 9.9 mIU/mL.  Performed at [facility], [place] [address] [place], [place] [address]   Hepatitis B surface antibody,quantitative     Status: None   Collection Time: 12/31/20 11:41 AM  Result Value Ref Range   Hepatitis B-Post 258.4 Immunity>9.9 mIU/mL    Comment: (NOTE)  Status of Immunity                     Anti-HBs Level  ------------------                     -------------- Inconsistent with Immunity                   0.0 - 9.9 Consistent with Immunity                          >9.9 Performed At: [facility] [place], [place] [id number] Rush Farmer MD [id number]   Glucose, capillary     Status: Abnormal   Collection Time: 12/31/20  8:54 PM  Result Value Ref Range   Glucose-Capillary 188 (H) 70 - 99 mg/dL    Comment: Glucose  reference range applies only to samples taken after fasting for at least 8 hours.   Comment 1 Notify RN    Comment 2 Document in Chart   CBC     Status: Abnormal   Collection Time: 01/01/21  2:42 AM  Result Value Ref Range   WBC 8.9 4.0 - 10.5 K/uL   RBC 3.18 (L) 3.87 - 5.11 MIL/uL   Hemoglobin 8.7 (L) 12.0 - 15.0 g/dL   HCT 26.8 (L) 36.0 - 46.0 %   MCV 84.3 80.0 - 100.0 fL   MCH 27.4 26.0 - 34.0 pg   MCHC 32.5 30.0 - 36.0 g/dL   RDW 14.8 11.5 - 15.5 %   Platelets  355 150 - 400 K/uL   nRBC 0.0 0.0 - 0.2 %    Comment: Performed at [facility], [place] [address], [place], [place] [address]  Renal function panel     Status: Abnormal   Collection Time: 01/01/21  2:42 AM  Result Value Ref Range   Sodium 125 (L) 135 - 145 mmol/L   Potassium 4.8 3.5 - 5.1 mmol/L   Chloride 84 (L) 98 - 111 mmol/L   CO2 20 (L) 22 - 32 mmol/L   Glucose, Bld 203 (H) 70 - 99 mg/dL    Comment: Glucose reference range applies only to samples taken after fasting for at least 8 hours.   BUN 76 (H) 8 - 23 mg/dL   Creatinine, Ser 6.50 (H) 0.44 - 1.00 mg/dL   Calcium 7.5 (L) 8.9 - 10.3 mg/dL   Phosphorus 6.7 (H) 2.5 - 4.6 mg/dL   Albumin 2.9 (L) 3.5 - 5.0 g/dL   GFR, Estimated 6 (L) >60 mL/min    Comment: (NOTE) Calculated using the CKD-EPI Creatinine Equation (2021)    Anion gap 21 (H) 5 - 15    Comment: REPEATED TO VERIFY Performed at [facility] [address] [place], [place] [address]   Glucose, capillary     Status: Abnormal   Collection Time: 01/01/21  6:15 AM  Result Value Ref Range   Glucose-Capillary 190 (H) 70 - 99 mg/dL    Comment: Glucose reference range applies only to samples taken after fasting for at least 8 hours.   Comment 1 Notify RN    Comment 2 Document in Chart   Glucose, capillary     Status: Abnormal   Collection Time: 01/01/21 11:24 AM  Result Value Ref Range   Glucose-Capillary 174 (H) 70 - 99 mg/dL    Comment: Glucose reference range applies only to samples  taken after fasting for at least 8 hours.  Glucose, capillary     Status: None   Collection Time: 01/01/21  4:05 PM  Result Value Ref Range   Glucose-Capillary 84 70 - 99 mg/dL    Comment: Glucose reference range applies only to samples taken after fasting for at least 8 hours.  Glucose, capillary     Status: None   Collection Time: 01/01/21  9:11 PM  Result Value Ref Range   Glucose-Capillary 71 70 - 99 mg/dL    Comment: Glucose reference range applies only to samples taken after fasting for at least 8 hours.  Glucose, capillary     Status: None   Collection Time: 01/02/21  1:53 AM  Result Value Ref Range   Glucose-Capillary 88 70 - 99 mg/dL    Comment: Glucose reference range applies only to samples taken after fasting for at least 8 hours.  CBC     Status: Abnormal   Collection Time: 01/02/21  2:11 AM  Result Value Ref Range   WBC 8.1 4.0 - 10.5 K/uL   RBC 3.05 (L) 3.87 - 5.11 MIL/uL   Hemoglobin 8.3 (L) 12.0 - 15.0 g/dL   HCT 25.4 (L) 36.0 - 46.0 %   MCV 83.3 80.0 - 100.0 fL   MCH 27.2 26.0 - 34.0 pg   MCHC 32.7 30.0 - 36.0 g/dL   RDW 15.1 11.5 - 15.5 %   Platelets 316 150 - 400 K/uL   nRBC 0.2 0.0 - 0.2 %    Comment: Performed at [facility], [place] [address] [place], [place] [address]  Glucose, capillary  Status: None   Collection Time: 01/02/21  5:34 AM  Result Value Ref Range   Glucose-Capillary 87 70 - 99 mg/dL    Comment: Glucose reference range applies only to samples taken after fasting for at least 8 hours.    CT HIP LEFT WO CONTRAST  Result Date: 12/31/2020 CLINICAL DATA:  Acute limping.  Left hip pain. EXAM: CT OF THE LEFT HIP WITHOUT CONTRAST TECHNIQUE: Multidetector CT imaging of the left hip was performed according to the standard protocol. Multiplanar CT image reconstructions were also generated. COMPARISON:  Radiographs 12/29/2020 FINDINGS: Bones/Joint/Cartilage Acute intertrochanteric fracture of the left hip, not readily appreciable on the recent  CT pelvis from 12/29/2020. Minimal comminution along the greater tuberosity portion. No other regional pelvic fracture is observed. Ligaments Suboptimally assessed by CT. Muscles and Tendons Unremarkable Soft tissues Substantial atherosclerotic vascular calcifications. Contrast medium in the urinary bladder and left distal ureter. Small amount of pelvic ascites adjacent to the uterus. Diffuse subcutaneous edema. IMPRESSION: 1. Acute left hip intertrochanteric fracture with minimal comminution along the greater trochanter. 2. Diffuse subcutaneous edema.  Small amount of ascites. 3. Atherosclerosis. Electronically Signed   By: [place] Clines M.D.   On: 12/31/2020 15:20   IR Fluoro Guide CV Line Right  Result Date: 01/01/2021 INDICATION: 70 year old with acute renal failure. Patient needs a non tunneled dialysis catheter. EXAM: FLUOROSCOPIC AND ULTRASOUND GUIDED PLACEMENT OF A NON-TUNNELED DIALYSIS CATHETER Physician: Stephan Minister. Anselm Pancoast, MD MEDICATIONS: Moderate sedation ANESTHESIA/SEDATION: Versed 0.5 mg IV; Fentanyl 25 mcg IV; Moderate Sedation Time:  10 minutes The patient was continuously monitored during the procedure by the interventional radiology nurse under my direct supervision. FLUOROSCOPY TIME:  Fluoroscopy Time: 6 seconds, 1 mGy COMPLICATIONS: None immediate. PROCEDURE: Informed consent was obtained for catheter placement. The patient was placed supine on the interventional table. Ultrasound confirmed a patent right common femoral vein. Ultrasound images were obtained for documentation. The right groin was prepped and draped in a sterile fashion. The right groin was anesthetized with 1% lidocaine. Maximal barrier sterile technique was utilized including caps, mask, sterile gowns, sterile gloves, sterile drape, hand hygiene and skin antiseptic. A small incision was made with #11 blade scalpel. A 21 gauge needle directed into the right common femoral vein with ultrasound guidance. A micropuncture dilator  set was placed. A 24 cm Mahurkar catheter was selected. The catheter was advanced over a wire and positioned in the lower IVC region. Fluoroscopic images were obtained for documentation. Both dialysis lumens were found to aspirate and flush well. The proper amount of heparin was flushed in both lumens. The central venous lumen was flushed with normal saline. Catheter was sutured to skin. FINDINGS: Catheter tip in the lower IVC region. IMPRESSION: Successful placement of a right femoral non-tunneled dialysis catheter using ultrasound and fluoroscopic guidance. Electronically Signed   By: Markus Daft M.D.   On: 01/01/2021 15:30   IR US Guide Vasc Access Right  Result Date: 01/01/2021 INDICATION: 70 year old with acute renal failure. Patient needs a non tunneled dialysis catheter. EXAM: FLUOROSCOPIC AND ULTRASOUND GUIDED PLACEMENT OF A NON-TUNNELED DIALYSIS CATHETER Physician: Stephan Minister. Anselm Pancoast, MD MEDICATIONS: Moderate sedation ANESTHESIA/SEDATION: Versed 0.5 mg IV; Fentanyl 25 mcg IV; Moderate Sedation Time:  10 minutes The patient was continuously monitored during the procedure by the interventional radiology nurse under my direct supervision. FLUOROSCOPY TIME:  Fluoroscopy Time: 6 seconds, 1 mGy COMPLICATIONS: None immediate. PROCEDURE: Informed consent was obtained for catheter placement. The patient was placed supine on the interventional table. Ultrasound confirmed  a patent right common femoral vein. Ultrasound images were obtained for documentation. The right groin was prepped and draped in a sterile fashion. The right groin was anesthetized with 1% lidocaine. Maximal barrier sterile technique was utilized including caps, mask, sterile gowns, sterile gloves, sterile drape, hand hygiene and skin antiseptic. A small incision was made with #11 blade scalpel. A 21 gauge needle directed into the right common femoral vein with ultrasound guidance. A micropuncture dilator set was placed. A 24 cm Mahurkar catheter was  selected. The catheter was advanced over a wire and positioned in the lower IVC region. Fluoroscopic images were obtained for documentation. Both dialysis lumens were found to aspirate and flush well. The proper amount of heparin was flushed in both lumens. The central venous lumen was flushed with normal saline. Catheter was sutured to skin. FINDINGS: Catheter tip in the lower IVC region. IMPRESSION: Successful placement of a right femoral non-tunneled dialysis catheter using ultrasound and fluoroscopic guidance. Electronically Signed   By: Markus Daft M.D.   On: 01/01/2021 15:30    Review of Systems  Unable to perform ROS: Mental status change  Blood pressure (!) 128/93, pulse 77, temperature 98.5 F (36.9 C), temperature source Oral, resp. rate 16, height '5\' 2"'$  (1.575 m), weight 68.7 kg, SpO2 100 %. Physical Exam Constitutional:      Appearance: She is ill-appearing.  HENT:     Head: Atraumatic.  Cardiovascular:     Rate and Rhythm: Normal rate.     Pulses: Normal pulses.  Abdominal:     General: Abdomen is flat.  Musculoskeletal:     Comments: Left hip pain with log roll.   Skin:    General: Skin is warm.  Neurological:     Mental Status: She is disoriented.    Assessment/Plan: Patient has a left intertrochanteric hip fracture.  Currently she is not medically suitable for surgery.  We discussed the surgery with the patient's husband and he is amenable when she is a more appropriate surgical candidate.  Plan will be for cephalomedullary nail.  She is planning for dialysis daily until improvement in her symptoms.  We also need to consider her urinary tract infection and the risk involved with placing orthopedic hardware in the setting of an infection.  We may wait for resolution of these symptoms as well.  Hopefully within the next few days will be able to perform the surgery.  We will follow along and touch base with the hospitalist team as her condition improves.  Erle Crocker 01/02/2021, 8:02 AM

## 2021-01-02 NOTE — Progress Notes (Signed)
[facility] Progress Note    Assessment/ Plan:   [name] is a 71yoF with a PMHx of HTN, left renal artery stenosis, HFpEF, CKD 3/4, CAD, PAD and diabetes who presented with chest pain, acute hypoxemic respiratory failure and hypertensive emergency.   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 Now s/p left ureteral stent placement but with no improvement. Baseline Cr ~2.6, though recently discharged with a Cr of 3.4.  - Avoid RAAS blockers or MRA's -HD started for uremia & failed lasix challenge. Rt fem temp line placed with IR on 9/18. HD#1 on 9/17 (slow start protocol). HD#2 tomorrow, tentative plan for #3 on Tues. - Trend BMP, renal dose medications for GFR<15 - Monitor Daily I/Os, Daily weight  - Maintain MAP>65 for optimal renal perfusion.  -avoid further nephrotoxins including NSAIDS, Morphine. Unless absolutely necessary, avoid CT with contrast and/or MRI with gadolinium.   Hematuria Left obstructive uropathy-  -in the setting of mildly obstructing 6 mm distal left ureter renal stone seen on CT urogram. S/p left ureteral stent on 9/15. ANCA and lupus serologies wnl. Appreciate urology's assistance  Intertrochanteric hip fracture- s/p fall in the hospital.  -ortho on board, awaiting resolution of acute issues first before proceeding with surgery -Preferred narcotic agents for pain control are hydromorphone, fentanyl, and methadone. Morphine should not be used. Avoid Baclofen  Epistaxis- overnight 9/14, since resolved. Thought to be due to nasal canula. Heparin on hold, continue to monitor. Anca serologies neg  Left renal artery stenosis- Hx of 80-90% proximal left renal artery stenosis. -as resp status and Afib improve, considering CO2 and stent of left renal artery however on hold given the fact that there is some contrast given. Once on HD, would consider  proceeding with this since we will be monitoring for recovery   AHRF, acute on chronic dCHF exacerbation- likely flash pulmonary edema. Holding lasix in the setting of AKI. Remains on supplemental oxygen. Minimal urine output. Plan for HD today.    Hypertension- BP well controlled on norvasc, metoprolol, and hydralazine   Hyponatremia-  worsening in the setting of lasix and AKI.  -Continue fluid restriction 1.2L, managing with HD--137Na bath   Afib w/ RVR-on amio. Hep gtt on hold given epistaxis, which has resolved.   Anemia due to chronic kidney disease-Hgb stable at 8.3. Hep gtt on hold given nosebleeds and concern for GIB -Transfuse for Hgb<7 g/dL   Diabetes Mellitus Type 2 with Hyperglycemia- -per primary    Subjective:    S/p rt fem temp HD line yesterday, no issues with HD yesterday, net uf 1L. Husband at bedside. More calm today. Apparently was able to converse with her husband.  Objective:   BP (!) 128/93 (BP Location: Right Arm)   Pulse 77   Temp 98.5 F (36.9 C) (Oral)   Resp 16   Ht '5\' 2"'$  (1.575 m)   Wt 68.7 kg   SpO2 100%   BMI 27.69 kg/m   Intake/Output Summary (Last 24 hours) at 01/02/2021 [id number] Last data filed at 01/01/2021 1459 Gross per 24 hour  Intake 267.76 ml  Output 1000 ml  Net -732.24 ml   Weight change: 0.181 kg  Physical Exam: Gen: in distress, yelling, laying flat in bed CVS:RRR, no murmurs Resp:crackles bibasilar, normal WOB [id number], nontender Ext:no edema Neuro: not orientable, yelling, moving all ext spontaneously  Imaging: CT HIP LEFT WO CONTRAST  Result Date: 12/31/2020 CLINICAL DATA:  Acute limping.  Left hip pain. EXAM: CT OF THE LEFT HIP WITHOUT CONTRAST TECHNIQUE: Multidetector CT imaging of the left hip was performed according to the standard protocol. Multiplanar CT image reconstructions were also generated. COMPARISON:  Radiographs 12/29/2020 FINDINGS: Bones/Joint/Cartilage Acute intertrochanteric fracture of the left hip, not  readily appreciable on the recent CT pelvis from 12/29/2020. Minimal comminution along the greater tuberosity portion. No other regional pelvic fracture is observed. Ligaments Suboptimally assessed by CT. Muscles and Tendons Unremarkable Soft tissues Substantial atherosclerotic vascular calcifications. Contrast medium in the urinary bladder and left distal ureter. Small amount of pelvic ascites adjacent to the uterus. Diffuse subcutaneous edema. IMPRESSION: 1. Acute left hip intertrochanteric fracture with minimal comminution along the greater trochanter. 2. Diffuse subcutaneous edema.  Small amount of ascites. 3. Atherosclerosis. Electronically Signed   By: Van Clines M.D.   On: 12/31/2020 15:20   IR Fluoro Guide CV Line Right  Result Date: 01/01/2021 INDICATION: 70 year old with acute renal failure. Patient needs a non tunneled dialysis catheter. EXAM: FLUOROSCOPIC AND ULTRASOUND GUIDED PLACEMENT OF A NON-TUNNELED DIALYSIS CATHETER Physician: Stephan Minister. Anselm Pancoast, MD MEDICATIONS: Moderate sedation ANESTHESIA/SEDATION: Versed 0.5 mg IV; Fentanyl 25 mcg IV; Moderate Sedation Time:  10 minutes The patient was continuously monitored during the procedure by the interventional radiology nurse under my direct supervision. FLUOROSCOPY TIME:  Fluoroscopy Time: 6 seconds, 1 mGy COMPLICATIONS: None immediate. PROCEDURE: Informed consent was obtained for catheter placement. The patient was placed supine on the interventional table. Ultrasound confirmed a patent right common femoral vein. Ultrasound images were obtained for documentation. The right groin was prepped and draped in a sterile fashion. The right groin was anesthetized with 1% lidocaine. Maximal barrier sterile technique was utilized including caps, mask, sterile gowns, sterile gloves, sterile drape, hand hygiene and skin antiseptic. A small incision was made with #11 blade scalpel. A 21 gauge needle directed into the right common femoral vein with ultrasound  guidance. A micropuncture dilator set was placed. A 24 cm Mahurkar catheter was selected. The catheter was advanced over a wire and positioned in the lower IVC region. Fluoroscopic images were obtained for documentation. Both dialysis lumens were found to aspirate and flush well. The proper amount of heparin was flushed in both lumens. The central venous lumen was flushed with normal saline. Catheter was sutured to skin. FINDINGS: Catheter tip in the lower IVC region. IMPRESSION: Successful placement of a right femoral non-tunneled dialysis catheter using ultrasound and fluoroscopic guidance. Electronically Signed   By: Markus Daft M.D.   On: 01/01/2021 15:30   IR US Guide Vasc Access Right  Result Date: 01/01/2021 INDICATION: 70 year old with acute renal failure. Patient needs a non tunneled dialysis catheter. EXAM: FLUOROSCOPIC AND ULTRASOUND GUIDED PLACEMENT OF A NON-TUNNELED DIALYSIS CATHETER Physician: Stephan Minister. Anselm Pancoast, MD MEDICATIONS: Moderate sedation ANESTHESIA/SEDATION: Versed 0.5 mg IV; Fentanyl 25 mcg IV; Moderate Sedation Time:  10 minutes The patient was continuously monitored during the procedure by the interventional radiology nurse under my direct supervision. FLUOROSCOPY TIME:  Fluoroscopy Time: 6 seconds, 1 mGy COMPLICATIONS: None immediate. PROCEDURE: Informed consent was obtained for catheter placement. The patient was placed supine on the interventional table. Ultrasound confirmed a patent right common femoral vein. Ultrasound images were obtained for documentation. The right groin was prepped and draped in a sterile fashion. The right groin was anesthetized with 1% lidocaine. Maximal barrier sterile technique was utilized including caps, mask, sterile gowns, sterile gloves, sterile drape, hand hygiene and skin antiseptic. A small incision was made with #  11 blade scalpel. A 21 gauge needle directed into the right common femoral vein with ultrasound guidance. A micropuncture dilator set was  placed. A 24 cm Mahurkar catheter was selected. The catheter was advanced over a wire and positioned in the lower IVC region. Fluoroscopic images were obtained for documentation. Both dialysis lumens were found to aspirate and flush well. The proper amount of heparin was flushed in both lumens. The central venous lumen was flushed with normal saline. Catheter was sutured to skin. FINDINGS: Catheter tip in the lower IVC region. IMPRESSION: Successful placement of a right femoral non-tunneled dialysis catheter using ultrasound and fluoroscopic guidance. Electronically Signed   By: Markus Daft M.D.   On: 01/01/2021 15:30    Labs: BMET Recent Labs  Lab 12/27/20 0640 12/27/20 0847 12/28/20 0336 12/29/20 0332 12/30/20 0353 12/31/20 0225 01/01/21 0242  NA 131* 130* 129* 126* 127* 126* 125*  K 3.8 3.8 3.5 3.7 3.9 4.4 4.8  CL 91*  --  87* 84* 85* 84* 84*  CO2 26  --  '31 26 25 22 '$ 20*  GLUCOSE 166*  --  177* 160* 118* 130* 203*  BUN 36*  --  44* 49* 58* 64* 76*  CREATININE 3.53*  --  3.79* 4.28* 4.92* 5.56* 6.50*  CALCIUM 9.7  --  8.7* 8.2* 8.0* 7.8* 7.5*  PHOS  --   --   --   --   --   --  6.7*   CBC Recent Labs  Lab 12/30/20 0353 12/31/20 0225 01/01/21 0242 01/02/21 0211  WBC 8.5 7.3 8.9 8.1  HGB 8.3* 8.2* 8.7* 8.3*  HCT 25.9* 26.6* 26.8* 25.4*  MCV 85.2 87.2 84.3 83.3  PLT 316 338 355 316    Medications:     Chlorhexidine Gluconate Cloth  6 each Topical Q0600   cilostazol  50 mg Oral BID   ezetimibe  10 mg Oral Daily   famotidine  20 mg Oral Daily   hydrALAZINE  25 mg Oral Q8H   insulin aspart  0-6 Units Subcutaneous TID WC   insulin aspart protamine- aspart  8 Units Subcutaneous Q breakfast   isosorbide mononitrate  30 mg Oral Daily   metoprolol succinate  50 mg Oral Daily   pantoprazole (PROTONIX) IV  40 mg Intravenous Daily   rosuvastatin  40 mg Oral Daily   sodium chloride flush  3 mL Intravenous Q12H   Gean Quint, MD [facility] 01/02/2021, 8:19 AM

## 2021-01-03 DIAGNOSIS — R935 Abnormal findings on diagnostic imaging of other abdominal regions, including retroperitoneum: Secondary | ICD-10-CM

## 2021-01-03 DIAGNOSIS — J9621 Acute and chronic respiratory failure with hypoxia: Secondary | ICD-10-CM | POA: Diagnosis not present

## 2021-01-03 DIAGNOSIS — J9622 Acute and chronic respiratory failure with hypercapnia: Secondary | ICD-10-CM

## 2021-01-03 DIAGNOSIS — D649 Anemia, unspecified: Secondary | ICD-10-CM | POA: Diagnosis not present

## 2021-01-03 LAB — CBC
HCT: 27.3 % — ABNORMAL LOW (ref 36.0–46.0)
HCT: 25.9 % — ABNORMAL LOW (ref 36.0–46.0)
Hemoglobin: 8.6 g/dL — ABNORMAL LOW (ref 12.0–15.0)
Hemoglobin: 8.2 g/dL — ABNORMAL LOW (ref 12.0–15.0)
MCH: 26.5 pg (ref 26.0–34.0)
MCH: 26.7 pg (ref 26.0–34.0)
MCHC: 31.5 g/dL (ref 30.0–36.0)
MCHC: 31.7 g/dL (ref 30.0–36.0)
MCV: 84 fL (ref 80.0–100.0)
MCV: 84.4 fL (ref 80.0–100.0)
Platelets: 380 10*3/uL (ref 150–400)
Platelets: 366 10*3/uL (ref 150–400)
RBC: 3.25 MIL/uL — ABNORMAL LOW (ref 3.87–5.11)
RBC: 3.07 MIL/uL — ABNORMAL LOW (ref 3.87–5.11)
RDW: 15.4 % (ref 11.5–15.5)
RDW: 15.6 % — ABNORMAL HIGH (ref 11.5–15.5)
WBC: 6.8 10*3/uL (ref 4.0–10.5)
WBC: 6.5 10*3/uL (ref 4.0–10.5)
nRBC: 0 % (ref 0.0–0.2)
nRBC: 0 % (ref 0.0–0.2)

## 2021-01-03 LAB — RENAL FUNCTION PANEL
Albumin: 2.4 g/dL — ABNORMAL LOW (ref 3.5–5.0)
Anion gap: 13 (ref 5–15)
BUN: 26 mg/dL — ABNORMAL HIGH (ref 8–23)
CO2: 24 mmol/L (ref 22–32)
Calcium: 8 mg/dL — ABNORMAL LOW (ref 8.9–10.3)
Chloride: 98 mmol/L (ref 98–111)
Creatinine, Ser: 3.21 mg/dL — ABNORMAL HIGH (ref 0.44–1.00)
GFR, Estimated: 15 mL/min — ABNORMAL LOW (ref >60)
Glucose, Bld: 131 mg/dL — ABNORMAL HIGH (ref 70–99)
Phosphorus: 2.6 mg/dL (ref 2.5–4.6)
Potassium: 3.6 mmol/L (ref 3.5–5.1)
Sodium: 135 mmol/L (ref 135–145)

## 2021-01-03 LAB — COMPREHENSIVE METABOLIC PANEL
ALT: 100 U/L — ABNORMAL HIGH (ref 0–44)
AST: 114 U/L — ABNORMAL HIGH (ref 15–41)
Albumin: 2.7 g/dL — ABNORMAL LOW (ref 3.5–5.0)
Alkaline Phosphatase: 93 U/L (ref 38–126)
Anion gap: 17 — ABNORMAL HIGH (ref 5–15)
BUN: 48 mg/dL — ABNORMAL HIGH (ref 8–23)
CO2: 24 mmol/L (ref 22–32)
Calcium: 8.6 mg/dL — ABNORMAL LOW (ref 8.9–10.3)
Chloride: 93 mmol/L — ABNORMAL LOW (ref 98–111)
Creatinine, Ser: 4.89 mg/dL — ABNORMAL HIGH (ref 0.44–1.00)
GFR, Estimated: 9 mL/min — ABNORMAL LOW (ref >60)
Glucose, Bld: 110 mg/dL — ABNORMAL HIGH (ref 70–99)
Potassium: 3.6 mmol/L (ref 3.5–5.1)
Sodium: 134 mmol/L — ABNORMAL LOW (ref 135–145)
Total Bilirubin: 0.7 mg/dL (ref 0.3–1.2)
Total Protein: 6.7 g/dL (ref 6.5–8.1)

## 2021-01-03 LAB — GLUCOSE, CAPILLARY
Glucose-Capillary: 53 mg/dL — ABNORMAL LOW (ref 70–99)
Glucose-Capillary: 150 mg/dL — ABNORMAL HIGH (ref 70–99)
Glucose-Capillary: 111 mg/dL — ABNORMAL HIGH (ref 70–99)
Glucose-Capillary: 97 mg/dL (ref 70–99)
Glucose-Capillary: 107 mg/dL — ABNORMAL HIGH (ref 70–99)
Glucose-Capillary: 106 mg/dL — ABNORMAL HIGH (ref 70–99)
Glucose-Capillary: 129 mg/dL — ABNORMAL HIGH (ref 70–99)

## 2021-01-03 LAB — MAGNESIUM
Magnesium: 2 mg/dL (ref 1.7–2.4)
Magnesium: 1.9 mg/dL (ref 1.7–2.4)

## 2021-01-03 MED ORDER — LIDOCAINE HCL (PF) 1 % IJ SOLN: 5.0000 mL | INTRAMUSCULAR | Status: DC | PRN

## 2021-01-03 MED ORDER — DEXTROSE 50 % IV SOLN
INTRAVENOUS | Status: AC
  Administered 2021-01-03: 50 mL
  Filled 2021-01-03: qty 50

## 2021-01-03 MED ORDER — PROSOURCE TF PO LIQD
45.0000 mL | Freq: Three times a day (TID) | ORAL | Status: DC
  Filled 2021-01-03 (×8): qty 45

## 2021-01-03 MED ORDER — LIDOCAINE-PRILOCAINE 2.5-2.5 % EX CREA: 1.0000 "application " | TOPICAL_CREAM | CUTANEOUS | Status: DC | PRN

## 2021-01-03 MED ORDER — HEPARIN SODIUM (PORCINE) 5000 UNIT/ML IJ SOLN
5000.0000 [IU] | Freq: Three times a day (TID) | INTRAMUSCULAR | Status: DC
  Administered 2021-01-03 – 2021-01-07 (×8): 5000 [IU] via SUBCUTANEOUS
  Filled 2021-01-03 (×9): qty 1

## 2021-01-03 MED ORDER — RENA-VITE PO TABS
1.0000 | ORAL_TABLET | Freq: Every day | ORAL | Status: DC
  Administered 2021-01-03 – 2021-01-16 (×14): 1 via ORAL
  Filled 2021-01-03 (×14): qty 1

## 2021-01-03 MED ORDER — PROSOURCE TF PO LIQD: 45.0000 mL | Freq: Two times a day (BID) | ORAL | Status: DC

## 2021-01-03 MED ORDER — PENTAFLUOROPROP-TETRAFLUOROETH EX AERO: 1.0000 "application " | INHALATION_SPRAY | CUTANEOUS | Status: DC | PRN

## 2021-01-03 MED ORDER — BISACODYL 10 MG RE SUPP
10.0000 mg | Freq: Once | RECTAL | Status: AC
  Administered 2021-01-03: 10 mg via RECTAL
  Filled 2021-01-03: qty 1

## 2021-01-03 MED ORDER — CHLORHEXIDINE GLUCONATE CLOTH 2 % EX PADS
6.0000 | MEDICATED_PAD | Freq: Every day | CUTANEOUS | Status: DC
  Administered 2021-01-03: 6 via TOPICAL

## 2021-01-03 MED ORDER — SODIUM CHLORIDE 0.9 % IV SOLN: 100.0000 mL | INTRAVENOUS | Status: DC | PRN

## 2021-01-03 MED ORDER — HEPARIN SODIUM (PORCINE) 1000 UNIT/ML DIALYSIS
1000.0000 [IU] | INTRAMUSCULAR | Status: DC | PRN
  Filled 2021-01-03: qty 1

## 2021-01-03 MED ORDER — ALTEPLASE 2 MG IJ SOLR
2.0000 mg | Freq: Once | INTRAMUSCULAR | Status: DC | PRN
  Filled 2021-01-03: qty 2

## 2021-01-03 MED ORDER — OSMOLITE 1.5 CAL PO LIQD
1000.0000 mL | ORAL | Status: DC
  Filled 2021-01-03 (×3): qty 1000

## 2021-01-03 MED ORDER — VITAL HIGH PROTEIN PO LIQD: 1000.0000 mL | ORAL | Status: DC

## 2021-01-03 NOTE — Progress Notes (Signed)
PROGRESS NOTE    Molly [name]  [id number] DOB: 21-Aug-1950 DOA: 12/27/2020 PCP: [name], MD   Brief Narrative:  70 year old female with past medical history of hypertension, left renal artery stenosis ([id number]) diastolic CHF, CKD stage IV, CAD, PAD with bilateral SFA CTO's, anemia chronic disease diabetes presented on 12/27/2020 with 4 days of chest pain.  BP was uncontrolled and spiked as high as 234/208 in the ED.  Chest x-ray consistent with pulmonary edema.  Patient briefly required BiPAP.   Admitted to [facility] service with cardiology consulted. Patient developed A. fib with RVR on 9/13.  Assessment & Plan:   Principal Problem:   Acute on chronic respiratory failure with hypoxia and hypercapnia (HCC) Active Problems:   HTN (hypertension)   Type 2 diabetes mellitus with stage 3b chronic kidney disease, with long-term current use of insulin (HCC)   Elevated troponin   Acute on chronic heart failure with preserved ejection fraction (HFpEF) (HCC)   CAP (community acquired pneumonia)   Anemia of chronic disease  Acute metabolic/uremic encephalopathy: Patient looks slightly more groggy today to me however husband at the bedside says that she was having a conversation with him earlier today.  She is scheduled to have another session of dialysis in hopes that will help improve her mentation.  Acute respiratory failure with hypoxia secondary to recurrent flash pulmonary edema/acute systolic congestive heart failure: Echo shows reduced ejection fraction to 40 to 45% compared to the previous 1.  Cardiology on board and managing.  Due to rising creatinine, she is on diuretics holiday.  No plans for diuretics per cardiology.  Plan for hemodialysis today.  Fall in hospital/left hip fracture: CT left hip indicates left hip fracture.  Orthopedics on board.  Plan for surgical repair once she is more stable.  Hypertensive emergency with chest pain: No chest pain and blood pressure  controlled now.  Continue amlodipine 10 mg p.o. daily, hydralazine 25 mg p.o. 3 times daily Imdur to 30 mg p.o. daily, Toprol-XL 50 mg p.o. daily.  Left renal artery stenosis: Has a history of 80 to 90% of proximal left renal artery stenosis.  Plan was to do angiogram and stent but due to rising creatinine, this plan is on hold for now.  Cardiology and nephrology managing.  New onset A. fib with RVR: Developed overnight 12/28/2020.  She was initially started on amiodarone drip and converted back to sinus rhythm.  Transitioned to oral amiodarone but now that she is unable to take anything p.o., she has been transitioned back to IV amiodarone.  Not on any anticoagulation due to slight drop in hemoglobin.  However hemoglobin has remained stable since 12/30/2020.  Will defer to cardiology about timing of resuming heparin.  Possible gastroduodenitis: CT abdomen suggests possible gastroduodenitis.  Seen by GI.  They recommend EGD which patient is not stable for that procedure.  Prior to his hemodialysis.  She has been started on Protonix.  Will eventually need EGD once more stable.  Defer to GI.  Epistaxis: Nosebleed overnight 12/30/2018, no further episodes.  Heparin was stopped and has remained on hold since then.  AKI on CKD stage IV/progressive renal failure/obstructive uropathy/left ureteral stone/left hydronephrosis/dross hematuria: CT renal stone yesterday shows 6 mm of left ureteral stone causing obstruction/hydronephrosis.  Patient underwent cystoscopy with left ureteral stent placement on 12/30/2020.  TDC was placed on 01/01/2021 and she received first session of dialysis.  She is going to have second session today.  Appreciate nephrology help.  Hyponatremia: No labs  today.  We will recheck tomorrow.  Community-acquired pneumonia: Completed 5 days of Rocephin on 12/31/2020.  Elevated troponin/demand ischemia: In the setting of hypertensive crisis.  EKG without ischemic changes.  Likely demand ischemia.  397 > 376.  Cardiology on board.  No plan for intervention.  Type 2 diabetes mellitus: Recent hemoglobin A1c 8.2% on 11/27/2020.  Home regimen includes Humulin 70/30 mix 8 to 12 units daily.  Currently on 8 units here and SSI.  Now having hypoglycemia, due to poor p.o. intake.  We will discontinue 70/30 and continue SSI only.  Dyslipidemia: Continue Zetia and Crestor.  PAD: Continue statin and Pletal.  Anemia of chronic disease: Hemoglobin has remained stable since 12/30/2020.  CT renal stone indicates possible gastritis or gastric ulcer.  GI on board.  GERD: Continue PPI.  Moderate to severe protein calorie malnutrition: Patient looks malnourished and she has not had p.o. intake since last 3-4 days.  Concerned about required calorie intake.  Dietitian was consulted yesterday, still waiting for assessment.  Abdominal tenderness: On examination, patient seems to have severe abdominal tenderness but it is nondistended.  Tenderness is generalized.  Not sure when her last bowel movement was.  It could very well be due to constipation.  Will provide with Dulcolax suppository but also proceed with CT abdomen pelvis.  DVT prophylaxis:    Code Status: Full Code  Family Communication: Patient's husband present at bedside.  Plan of care discussed with him in length.    Status is: Inpatient  Remains inpatient appropriate because:Ongoing diagnostic testing needed not appropriate for outpatient work up  Dispo: The patient is from: Home              Anticipated d/c is to: Home              Patient currently is not medically stable to d/c.   Difficult to place patient No        Estimated body mass index is 27.62 kg/m as calculated from the following:   Height as of this encounter: '5\' 2"'$  (1.575 m).   Weight as of this encounter: 68.5 kg.     Nutritional Assessment: Body mass index is 27.62 kg/m.Marland Kitchen Seen by dietician.  I agree with the assessment and plan as outlined below: Nutrition  Status:       Skin Assessment: I have examined the patient's skin and I agree with the wound assessment as performed by the wound care RN as outlined below:    Consultants:  Nephrology Cardiology Orthopedic Urology  Procedures:  As above  Antimicrobials:  Anti-infectives (From admission, onward)    Start     Dose/Rate Route Frequency Ordered Stop   12/31/20 1330  cefTRIAXone (ROCEPHIN) 1 g in sodium chloride 0.9 % 100 mL IVPB        1 g 200 mL/hr over 30 Minutes Intravenous  Once 12/31/20 1315 12/31/20 1350   12/31/20 1000  cefTRIAXone (ROCEPHIN) 1 g in sodium chloride 0.9 % 100 mL IVPB  Status:  Discontinued        1 g 200 mL/hr over 30 Minutes Intravenous Every 24 hours 12/30/20 1202 12/31/20 1315   12/27/20 1530  cefTRIAXone (ROCEPHIN) 2 g in sodium chloride 0.9 % 100 mL IVPB  Status:  Discontinued        2 g 200 mL/hr over 30 Minutes Intravenous Every 24 hours 12/27/20 1435 12/30/20 1202          Subjective: Patient seen and examined husband at the bedside.  Patient somewhat more lethargic compared to yesterday but easily arousable.  Per husband, she was having conversation with him and with someone else on the phone as well earlier today.  Objective: Vitals:   01/02/21 1926 01/03/21 0028 01/03/21 0200 01/03/21 0430  BP: (!) 143/55 (!) 150/48  (!) 145/52  Pulse: 73 72  91  Resp: '17 16  16  '$ Temp: (!) 97.5 F (36.4 C) 98.3 F (36.8 C)  98.1 F (36.7 C)  TempSrc: Oral Oral  Oral  SpO2: 95% 95%  92%  Weight:   68.5 kg   Height:        Intake/Output Summary (Last 24 hours) at 01/03/2021 1041 Last data filed at 01/03/2021 0500 Gross per 24 hour  Intake 591.22 ml  Output 300 ml  Net 291.22 ml    Filed Weights   01/01/21 1459 01/02/21 0139 01/03/21 0200  Weight: 68.2 kg 68.7 kg 68.5 kg    Examination: General exam: Appears lethargic but arousable Respiratory system: Diminished breath sounds at the bases, poor inspiratory effort. Cardiovascular system:  S1 & S2 heard, RRR. No JVD, murmurs, rubs, gallops or clicks. No pedal edema. Gastrointestinal system: Abdomen is nondistended, soft but has moderate to severe generalized tenderness. No organomegaly or masses felt. Normal bowel sounds heard. Central nervous system: Lethargic, not participating in conversation, unable to assess orientation.   Data Reviewed: I have personally reviewed following labs and imaging studies  CBC: Recent Labs  Lab 12/30/20 0353 12/31/20 0225 01/01/21 0242 01/02/21 0211 01/03/21 0803  WBC 8.5 7.3 8.9 8.1 6.8  HGB 8.3* 8.2* 8.7* 8.3* 8.6*  HCT 25.9* 26.6* 26.8* 25.4* 27.3*  MCV 85.2 87.2 84.3 83.3 84.0  PLT 316 338 355 316 [id number]    Basic Metabolic Panel: Recent Labs  Lab 12/29/20 0332 12/30/20 0353 12/31/20 0225 01/01/21 0242 01/03/21 0803  NA 126* 127* 126* 125* 134*  K 3.7 3.9 4.4 4.8 3.6  CL 84* 85* 84* 84* 93*  CO2 '26 25 22 '$ 20* 24  GLUCOSE 160* 118* 130* 203* 110*  BUN 49* 58* 64* 76* 48*  CREATININE 4.28* 4.92* 5.56* 6.50* 4.89*  CALCIUM 8.2* 8.0* 7.8* 7.5* 8.6*  MG 2.1  --   --   --  2.0  PHOS  --   --   --  6.7*  --     GFR: Estimated Creatinine Clearance: 10 mL/min (A) (by C-G formula based on SCr of 4.89 mg/dL (H)). Liver Function Tests: Recent Labs  Lab 01/01/21 0242 01/03/21 0803  AST  --  114*  ALT  --  100*  ALKPHOS  --  93  BILITOT  --  0.7  PROT  --  6.7  ALBUMIN 2.9* 2.7*    No results for input(s): LIPASE, AMYLASE in the last 168 hours. No results for input(s): AMMONIA in the last 168 hours. Coagulation Profile: No results for input(s): INR, PROTIME in the last 168 hours. Cardiac Enzymes: No results for input(s): CKTOTAL, CKMB, CKMBINDEX, TROPONINI in the last 168 hours. BNP (last 3 results) No results for input(s): PROBNP in the last 8760 hours. HbA1C: No results for input(s): HGBA1C in the last 72 hours. CBG: Recent Labs  Lab 01/02/21 2027 01/03/21 0102 01/03/21 0154 01/03/21 0553 01/03/21 0831   GLUCAP 75 53* 150* 111* 97    Lipid Profile: No results for input(s): CHOL, HDL, LDLCALC, TRIG, CHOLHDL, LDLDIRECT in the last 72 hours. Thyroid Function Tests: No results for input(s): TSH, T4TOTAL, FREET4, T3FREE, THYROIDAB in the last  72 hours. Anemia Panel: No results for input(s): VITAMINB12, FOLATE, FERRITIN, TIBC, IRON, RETICCTPCT in the last 72 hours. Sepsis Labs: Recent Labs  Lab 12/27/20 1130  PROCALCITON 0.39     Recent Results (from the past 240 hour(s))  Resp Panel by RT-PCR (Flu A&B, Covid) Nasopharyngeal Swab     Status: None   Collection Time: 12/27/20  8:53 AM   Specimen: Nasopharyngeal Swab; Nasopharyngeal(NP) swabs in vial transport medium  Result Value Ref Range Status   SARS Coronavirus 2 by RT PCR NEGATIVE NEGATIVE Final    Comment: (NOTE) SARS-CoV-2 target nucleic acids are NOT DETECTED.  The SARS-CoV-2 RNA is generally detectable in upper respiratory specimens during the acute phase of infection. The lowest concentration of SARS-CoV-2 viral copies this assay can detect is 138 copies/mL. A negative result does not preclude SARS-Cov-2 infection and should not be used as the sole basis for treatment or other patient management decisions. A negative result may occur with  improper specimen collection/handling, submission of specimen other than nasopharyngeal swab, presence of viral mutation(s) within the areas targeted by this assay, and inadequate number of viral copies(<138 copies/mL). A negative result must be combined with clinical observations, patient history, and epidemiological information. The expected result is Negative.  Fact Sheet for Patients:  EntrepreneurPulse.com.au  Fact Sheet for Healthcare Providers:  IncredibleEmployment.be  This test is no t yet approved or cleared by the [place] FDA and  has been authorized for detection and/or diagnosis of SARS-CoV-2 by FDA under an Emergency Use  Authorization (EUA). This EUA will remain  in effect (meaning this test can be used) for the duration of the COVID-19 declaration under Section 564(b)(1) of the Act, 21 U.S.C.section 360bbb-3(b)(1), unless the authorization is terminated  or revoked sooner.       Influenza A by PCR NEGATIVE NEGATIVE Final   Influenza B by PCR NEGATIVE NEGATIVE Final    Comment: (NOTE) The Xpert Xpress SARS-CoV-2/FLU/RSV plus assay is intended as an aid in the diagnosis of influenza from Nasopharyngeal swab specimens and should not be used as a sole basis for treatment. Nasal washings and aspirates are unacceptable for Xpert Xpress SARS-CoV-2/FLU/RSV testing.  Fact Sheet for Patients: EntrepreneurPulse.com.au  Fact Sheet for Healthcare Providers: IncredibleEmployment.be  This test is not yet approved or cleared by the [place] FDA and has been authorized for detection and/or diagnosis of SARS-CoV-2 by FDA under an Emergency Use Authorization (EUA). This EUA will remain in effect (meaning this test can be used) for the duration of the COVID-19 declaration under Section 564(b)(1) of the Act, 21 U.S.C. section 360bbb-3(b)(1), unless the authorization is terminated or revoked.  Performed at [facility], [place] [address], [place], [place] [address]        Radiology Studies: IR Fluoro Guide CV Line Right  Result Date: 01/01/2021 INDICATION: 70 year old with acute renal failure. Patient needs a non tunneled dialysis catheter. EXAM: FLUOROSCOPIC AND ULTRASOUND GUIDED PLACEMENT OF A NON-TUNNELED DIALYSIS CATHETER Physician: Stephan Minister. Anselm Pancoast, MD MEDICATIONS: Moderate sedation ANESTHESIA/SEDATION: Versed 0.5 mg IV; Fentanyl 25 mcg IV; Moderate Sedation Time:  10 minutes The patient was continuously monitored during the procedure by the interventional radiology nurse under my direct supervision. FLUOROSCOPY TIME:  Fluoroscopy Time: 6 seconds, 1 mGy COMPLICATIONS:  None immediate. PROCEDURE: Informed consent was obtained for catheter placement. The patient was placed supine on the interventional table. Ultrasound confirmed a patent right common femoral vein. Ultrasound images were obtained for documentation. The right groin was prepped and draped  in a sterile fashion. The right groin was anesthetized with 1% lidocaine. Maximal barrier sterile technique was utilized including caps, mask, sterile gowns, sterile gloves, sterile drape, hand hygiene and skin antiseptic. A small incision was made with #11 blade scalpel. A 21 gauge needle directed into the right common femoral vein with ultrasound guidance. A micropuncture dilator set was placed. A 24 cm Mahurkar catheter was selected. The catheter was advanced over a wire and positioned in the lower IVC region. Fluoroscopic images were obtained for documentation. Both dialysis lumens were found to aspirate and flush well. The proper amount of heparin was flushed in both lumens. The central venous lumen was flushed with normal saline. Catheter was sutured to skin. FINDINGS: Catheter tip in the lower IVC region. IMPRESSION: Successful placement of a right femoral non-tunneled dialysis catheter using ultrasound and fluoroscopic guidance. Electronically Signed   By: Markus Daft M.D.   On: 01/01/2021 15:30   IR US Guide Vasc Access Right  Result Date: 01/01/2021 INDICATION: 70 year old with acute renal failure. Patient needs a non tunneled dialysis catheter. EXAM: FLUOROSCOPIC AND ULTRASOUND GUIDED PLACEMENT OF A NON-TUNNELED DIALYSIS CATHETER Physician: Stephan Minister. Anselm Pancoast, MD MEDICATIONS: Moderate sedation ANESTHESIA/SEDATION: Versed 0.5 mg IV; Fentanyl 25 mcg IV; Moderate Sedation Time:  10 minutes The patient was continuously monitored during the procedure by the interventional radiology nurse under my direct supervision. FLUOROSCOPY TIME:  Fluoroscopy Time: 6 seconds, 1 mGy COMPLICATIONS: None immediate. PROCEDURE: Informed consent was  obtained for catheter placement. The patient was placed supine on the interventional table. Ultrasound confirmed a patent right common femoral vein. Ultrasound images were obtained for documentation. The right groin was prepped and draped in a sterile fashion. The right groin was anesthetized with 1% lidocaine. Maximal barrier sterile technique was utilized including caps, mask, sterile gowns, sterile gloves, sterile drape, hand hygiene and skin antiseptic. A small incision was made with #11 blade scalpel. A 21 gauge needle directed into the right common femoral vein with ultrasound guidance. A micropuncture dilator set was placed. A 24 cm Mahurkar catheter was selected. The catheter was advanced over a wire and positioned in the lower IVC region. Fluoroscopic images were obtained for documentation. Both dialysis lumens were found to aspirate and flush well. The proper amount of heparin was flushed in both lumens. The central venous lumen was flushed with normal saline. Catheter was sutured to skin. FINDINGS: Catheter tip in the lower IVC region. IMPRESSION: Successful placement of a right femoral non-tunneled dialysis catheter using ultrasound and fluoroscopic guidance. Electronically Signed   By: Markus Daft M.D.   On: 01/01/2021 15:30    Scheduled Meds:  Chlorhexidine Gluconate Cloth  6 each Topical Q0600   cilostazol  50 mg Oral BID   ezetimibe  10 mg Oral Daily   famotidine  20 mg Oral Daily   hydrALAZINE  25 mg Oral Q8H   insulin aspart  0-6 Units Subcutaneous TID WC   isosorbide mononitrate  30 mg Oral Daily   lidocaine  1 patch Transdermal Q24H   metoprolol succinate  50 mg Oral Daily   pantoprazole (PROTONIX) IV  40 mg Intravenous Daily   rosuvastatin  40 mg Oral Daily   sodium chloride flush  3 mL Intravenous Q12H   Continuous Infusions:  sodium chloride     amiodarone 30 mg/hr (01/03/21 0822)     LOS: 7 days   Time spent: 33 minutes   Darliss Cheney, MD [facility]  01/03/2021, 10:41 AM  Please page via  Amion and do not message via secure chat for anything urgent. Secure chat can be used for anything non urgent and I will respond at my earliest availability.  How to contact the [facility] Attending or Consulting provider [place] or covering provider during after hours [place], for this patient?  Check the care team in [facility] and look for a) attending/consulting [facility] provider listed and b) the [facility] team listed. Page or secure chat [phone]. Log into [email or web address] and use [place]'s universal password to access. If you do not have the password, please contact the hospital operator. Locate the [facility] provider you are looking for under [facility] and page to a number that you can be directly reached. If you still have difficulty reaching the provider, please page the [facility] (Director on Call) for the Hospitalists listed on amion for assistance.

## 2021-01-03 NOTE — Progress Notes (Signed)
Initial Nutrition Assessment  DOCUMENTATION CODES:   Not applicable  INTERVENTION:   -Renal MVI daily -RD will follow for diet advancement and add supplements as appropriate -Once feeding access if placed, add:  Initiate Osmolite 1.5 @ 20 ml/hr and increase by 10 ml every 4 hours to goal rate of 50 ml/hr.   45 ml Prosource Plus TID.    Tube feeding regimen provides 1920 kcal (100% of needs), 108 grams of protein, and 914 ml of H2O.    NUTRITION DIAGNOSIS:   Inadequate oral intake related to inability to eat as evidenced by NPO status.  GOAL:   Patient will meet greater than or equal to 90% of their needs  MONITOR:   Diet advancement, Labs, Weight trends, TF tolerance, Skin  REASON FOR ASSESSMENT:   Consult Assessment of nutrition requirement/status  ASSESSMENT:   Molly [name] is a 71 y.o. female with medical history significant of HTN, diastolic CHF, CKD stage IV, PAD, anemia of chronic kidney disease, and diabetes mellitus type 2 presents with complaints of a chest pain for the last 3 to 4 days.  Patient reports having a right substernal chest heaviness.  Noted associated symptoms of shortness of breath, orthopnea, insomnia, weight gain of approximately 5 pounds in the last week, nausea, vomiting, and belching.  Denies any fever, cough, leg swelling, abdominal pain, or dysuria symptoms.  She had tried taking extra dose of furosemide for 2 days without any change in symptoms.  Previously admitted last month for acute on hypercapnic respiratory failure with hypoxia secondary to diastolic heart failure exacerbation.  Pt admitted with acute on chronic respiratory failure secondary to heart failure.   9/14- s/p fall 9/15- s/p cystoscopy with lt uretal stent placement 9/17- placement of rt femoral non-tunneled HD catheter, first HD treatment  Reviewed I/O's: +291 ml x 24 hours and +1.9 L since admission  UOP: 300 ml x 24 hours   Per orthopedics notes, pt with lt hip fx s/p  fall in hospital. Plan for operative intervention (nailing) once more medically stable.  Case discussed with RN, who reports pt is more alert compared to yesterday. Pt able to take medications today.   Spoke with pt and husband at bedside. Due to mental status, pt husband provided most of the history. PTA pt with good appetite and was consuming 3 meals per day on a low sugar, low sodium diet. He prepares meals using a DASH diet cookbook and through guidance of her outpatient RD.   Per pt husband, pt has had declining mental status for the past 3-4 days PTA and unable to eat much due to this. Pt has improved today, however, does not keep her eyes opened for conversation and often answers with one word answers. Based upon interaction with pt today, do not think pt will be able to meet nutritional needs via PO intake due to mental status. Per husband, mental status has improved with HD and is hopeful for continued improvement.   Discussed rationale for NPO status. Pt and husband agree to cortrak tube. Case discussed with RN, MD, and cortrak tube team.   Reviewed wt hx; wt has been stable over the past 3 months. Per husband, UBW is around 167#.   ADDNEDUM: Cortrak unable to be placed today as pt was in HD for the majority of the afternoon. Cortrak RD discussed alternative ways of tube placement (bedside NGT placement vs IR placement tomorrow). Cortrak service will be available on Wednesday, 01/05/21.  Medications reviewed.  Lab Results  Component Value Date   HGBA1C 8.2 (H) 11/27/2020   PTA DM medications are 8-12 insulin isophane and regular human insulin daily.   Labs reviewed: CBGS: 52-150 (inpatient orders for glycemic control are 0-6 units inuslin aspart TID with meals).    NUTRITION - FOCUSED PHYSICAL EXAM:  Flowsheet Row Most Recent Value  Orbital Region No depletion  Upper Arm Region No depletion  Thoracic and Lumbar Region No depletion  Buccal Region No depletion  Temple Region No  depletion  Clavicle Bone Region No depletion  Clavicle and Acromion Bone Region No depletion  Scapular Bone Region No depletion  Dorsal Hand Mild depletion  Patellar Region No depletion  Anterior Thigh Region No depletion  Posterior Calf Region No depletion  Edema (RD Assessment) Mild  Hair Reviewed  Eyes Reviewed  Mouth Reviewed  Skin Reviewed  Nails Reviewed       Diet Order:   Diet Order             Diet NPO time specified  Diet effective midnight                   EDUCATION NEEDS:   Education needs have been addressed  Skin:  Skin Assessment: Skin Integrity Issues: Skin Integrity Issues:: Incisions Incisions: closed perineum  Last BM:  12/28/20  Height:   Ht Readings from Last 1 Encounters:  12/27/20 '5\' 2"'$  (1.575 m)    Weight:   Wt Readings from Last 1 Encounters:  01/03/21 67.8 kg    Ideal Body Weight:  50 kg  BMI:  Body mass index is 27.34 kg/m.  Estimated Nutritional Needs:   Kcal:  [id number]  Protein:  100-115 grams  Fluid:  1000 ml + UOP    Loistine Chance, RD, LDN, [place] Registered Dietitian II Certified Diabetes Care and Education Specialist Please refer to [facility] for RD and/or RD on-call/weekend/after hours pager

## 2021-01-03 NOTE — Progress Notes (Addendum)
Daily Rounding Note  01/03/2021, 12:58 PM  LOS: 7 days   SUBJECTIVE:   Chief complaint: Normocytic anemia. Remains lethargic, somnolent.  Currently on HD.  No bloody or melenic stools reported.  Nods negative responses to questions of abdominal pain, nausea.  OBJECTIVE:         Vital signs in last 24 hours:    Temp:  [97.5 F (36.4 C)-98.3 F (36.8 C)] 98.1 F (36.7 C) (09/19 0430) Pulse Rate:  [72-91] 74 (09/19 1139) Resp:  [16-17] 16 (09/19 0430) BP: (143-162)/(48-74) 162/74 (09/19 1139) SpO2:  [92 %-95 %] 92 % (09/19 0430) Weight:  [68.5 kg] 68.5 kg (09/19 0200) Last BM Date: 12/28/20 Filed Weights   01/01/21 1459 01/02/21 0139 01/03/21 0200  Weight: 68.2 kg 68.7 kg 68.5 kg   General: Lethargic.  Arouses for short periods of time. Heart: RRR. Chest: No labored breathing.  Lungs clear in front Abdomen: Soft.  Not distended.  Active bowel sounds.  Tenderness without guarding or rebound in right upper quadrant. Extremities: No CCE. Neuro/Psych: Inconsistently follows commands.  Somnolent/lethargic.  Intake/Output from previous day: 09/18 0701 - 09/19 0700 In: 591.2 [P.O.:150; I.V.:441.2] Out: 300 [Urine:300]  Intake/Output this shift: No intake/output data recorded.  Lab Results: Recent Labs    01/01/21 0242 01/02/21 0211 01/03/21 0803  WBC 8.9 8.1 6.8  HGB 8.7* 8.3* 8.6*  HCT 26.8* 25.4* 27.3*  PLT 355 316 380   BMET Recent Labs    01/01/21 0242 01/03/21 0803  NA 125* 134*  K 4.8 3.6  CL 84* 93*  CO2 20* 24  GLUCOSE 203* 110*  BUN 76* 48*  CREATININE 6.50* 4.89*  CALCIUM 7.5* 8.6*   LFT Recent Labs    01/01/21 0242 01/03/21 0803  PROT  --  6.7  ALBUMIN 2.9* 2.7*  AST  --  114*  ALT  --  100*  ALKPHOS  --  93  BILITOT  --  0.7   PT/INR No results for input(s): LABPROT, INR in the last 72 hours. Hepatitis Panel No results for input(s): HEPBSAG, HCVAB, HEPAIGM, HEPBIGM in the  last 72 hours.  Studies/Results: No results found.  Scheduled Meds:  Chlorhexidine Gluconate Cloth  6 each Topical Q0600   cilostazol  50 mg Oral BID   ezetimibe  10 mg Oral Daily   famotidine  20 mg Oral Daily   heparin injection (subcutaneous)  5,000 Units Subcutaneous Q8H   hydrALAZINE  25 mg Oral Q8H   insulin aspart  0-6 Units Subcutaneous TID WC   isosorbide mononitrate  30 mg Oral Daily   lidocaine  1 patch Transdermal Q24H   metoprolol succinate  50 mg Oral Daily   pantoprazole (PROTONIX) IV  40 mg Intravenous Daily   rosuvastatin  40 mg Oral Daily   sodium chloride flush  3 mL Intravenous Q12H   Continuous Infusions:  sodium chloride     sodium chloride     sodium chloride     amiodarone 30 mg/hr (01/03/21 0822)   PRN Meds:.sodium chloride, sodium chloride, sodium chloride, acetaminophen, alteplase, fentaNYL, heparin, heparin sodium (porcine), hydrALAZINE, hydrocortisone cream, HYDROmorphone (DILAUDID) injection, lidocaine (PF), lidocaine, lidocaine-prilocaine, loperamide, loratadine, midazolam, Muscle Rub, naLOXone (NARCAN)  injection, pentafluoroprop-tetrafluoroeth, sodium chloride, sodium chloride flush   ASSESMENT:     Acute [place] anemia. In setting of new AC, hematuria, AKI/CKD.  Unremarkable screening colonoscopy 2017.  Renal stone CT 9/14 showing inflammation at the gastric antrum, duodenum possibly representing  ulcer disease.  On Protonix 40 IV daily now, famotidine 20 mg bid PTA.  CTAP wo contrast ordered.    Epistaxis 9/14, resolved.      Afib, RVR.  All anticoagulation (recently heparin) discontinued.  Elevated Trops and BNP.      Hematuria.  AKI, uremia.  Obstructing left ureteral stone, status post stenting.  Initiated on hemodialysis this admission.    AMS.      Elevated pancreatic eleastase, ? Pancreatic insufficiency.    Acute left hip fracture following inpatient fall 12/29/2020.  Ortho MD Lucia Gaskins notes pt currently not medically suitable for surgery.      PLAN   Check stool for occult blood.    Azucena Freed  01/03/2021, 12:58 PM Phone [phone]

## 2021-01-03 NOTE — Progress Notes (Signed)
CBG went back to 150, Thanks Buckner [place].

## 2021-01-03 NOTE — Progress Notes (Signed)
[facility] ROUNDING NOTE   Subjective:   Interval History: 70 year old lady with history of hypertension left renal artery stenosis [id number] diastolic dysfunction chronic kidney disease stage IV peripheral artery disease bilateral  superficial femoral artery obstruction.  Anemia, diabetes.  Presented 12/27/2020 with chest pain.  Was also found to have uncontrolled hypertension.  Blood pressure 145/52 pulse 75 temperature 98.1 O2 sats 90%.  Last dialysis 01/01/2021 1 L removed  Hemoglobin 8.3.  Last labs 01/01/2021 showed a creatinine of 6.5 sodium 125 CO2 20 calcium 7.5 albumin 2.9 phosphorus 6.7.  Urine output 300 cc recorded 01/03/2021  Objective:  Vital signs in last 24 hours:  Temp:  [97.5 F (36.4 C)-98.3 F (36.8 C)] 98.1 F (36.7 C) (09/19 0430) Pulse Rate:  [72-91] 91 (09/19 0430) Resp:  [16-17] 16 (09/19 0430) BP: (143-150)/(48-60) 145/52 (09/19 0430) SpO2:  [92 %-95 %] 92 % (09/19 0430) Weight:  [68.5 kg] 68.5 kg (09/19 0200)  Weight change: -0.9 kg Filed Weights   01/01/21 1459 01/02/21 0139 01/03/21 0200  Weight: 68.2 kg 68.7 kg 68.5 kg    Intake/Output: I/O last 3 completed shifts: In: 709 [I.V.:709] Out: 1000 [Other:1000]   Intake/Output this shift:  Total I/O In: 100 [P.O.:100] Out: 300 [Urine:300]  Gen: in distress, yelling, laying flat in bed CVS:RRR, no murmurs Resp:crackles bibasilar, normal WOB [id number], nontender Ext:no edema Neuro: not orientable, yelling, moving all ext spontaneously   Basic Metabolic Panel: Recent Labs  Lab 12/28/20 0336 12/29/20 0332 12/30/20 0353 12/31/20 0225 01/01/21 0242  NA 129* 126* 127* 126* 125*  K 3.5 3.7 3.9 4.4 4.8  CL 87* 84* 85* 84* 84*  CO2 '31 26 25 22 '$ 20*  GLUCOSE 177* 160* 118* 130* 203*  BUN 44* 49* 58* 64* 76*  CREATININE 3.79* 4.28* 4.92* 5.56* 6.50*  CALCIUM 8.7* 8.2* 8.0* 7.8* 7.5*  MG  --  2.1  --   --   --   PHOS  --   --   --   --  6.7*    Liver Function Tests: Recent Labs   Lab 01/01/21 0242  ALBUMIN 2.9*   No results for input(s): LIPASE, AMYLASE in the last 168 hours. No results for input(s): AMMONIA in the last 168 hours.  CBC: Recent Labs  Lab 12/29/20 0332 12/30/20 0353 12/31/20 0225 01/01/21 0242 01/02/21 0211  WBC 9.0 8.5 7.3 8.9 8.1  HGB 9.0* 8.3* 8.2* 8.7* 8.3*  HCT 27.6* 25.9* 26.6* 26.8* 25.4*  MCV 83.6 85.2 87.2 84.3 83.3  PLT 296 316 338 355 316    Cardiac Enzymes: No results for input(s): CKTOTAL, CKMB, CKMBINDEX, TROPONINI in the last 168 hours.  BNP: Invalid input(s): POCBNP  CBG: Recent Labs  Lab 01/02/21 1612 01/02/21 2027 01/03/21 0102 01/03/21 0154 01/03/21 0553  GLUCAP 73 75 53* 150* 111*    Microbiology: Results for orders placed or performed during the hospital encounter of 12/27/20  Resp Panel by RT-PCR (Flu A&B, Covid) Nasopharyngeal Swab     Status: None   Collection Time: 12/27/20  8:53 AM   Specimen: Nasopharyngeal Swab; Nasopharyngeal(NP) swabs in vial transport medium  Result Value Ref Range Status   SARS Coronavirus 2 by RT PCR NEGATIVE NEGATIVE Final    Comment: (NOTE) SARS-CoV-2 target nucleic acids are NOT DETECTED.  The SARS-CoV-2 RNA is generally detectable in upper respiratory specimens during the acute phase of infection. The lowest concentration of SARS-CoV-2 viral copies this assay can detect is 138 copies/mL. A negative result does not  preclude SARS-Cov-2 infection and should not be used as the sole basis for treatment or other patient management decisions. A negative result may occur with  improper specimen collection/handling, submission of specimen other than nasopharyngeal swab, presence of viral mutation(s) within the areas targeted by this assay, and inadequate number of viral copies(<138 copies/mL). A negative result must be combined with clinical observations, patient history, and epidemiological information. The expected result is Negative.  Fact Sheet for Patients:   EntrepreneurPulse.com.au  Fact Sheet for Healthcare Providers:  IncredibleEmployment.be  This test is no t yet approved or cleared by the [place] FDA and  has been authorized for detection and/or diagnosis of SARS-CoV-2 by FDA under an Emergency Use Authorization (EUA). This EUA will remain  in effect (meaning this test can be used) for the duration of the COVID-19 declaration under Section 564(b)(1) of the Act, 21 U.S.C.section 360bbb-3(b)(1), unless the authorization is terminated  or revoked sooner.       Influenza A by PCR NEGATIVE NEGATIVE Final   Influenza B by PCR NEGATIVE NEGATIVE Final    Comment: (NOTE) The Xpert Xpress SARS-CoV-2/FLU/RSV plus assay is intended as an aid in the diagnosis of influenza from Nasopharyngeal swab specimens and should not be used as a sole basis for treatment. Nasal washings and aspirates are unacceptable for Xpert Xpress SARS-CoV-2/FLU/RSV testing.  Fact Sheet for Patients: EntrepreneurPulse.com.au  Fact Sheet for Healthcare Providers: IncredibleEmployment.be  This test is not yet approved or cleared by the [place] FDA and has been authorized for detection and/or diagnosis of SARS-CoV-2 by FDA under an Emergency Use Authorization (EUA). This EUA will remain in effect (meaning this test can be used) for the duration of the COVID-19 declaration under Section 564(b)(1) of the Act, 21 U.S.C. section 360bbb-3(b)(1), unless the authorization is terminated or revoked.  Performed at [facility], [place] [address] [place], [place] [address]     Coagulation Studies: No results for input(s): LABPROT, INR in the last 72 hours.  Urinalysis: No results for input(s): COLORURINE, LABSPEC, PHURINE, GLUCOSEU, HGBUR, BILIRUBINUR, KETONESUR, PROTEINUR, UROBILINOGEN, NITRITE, LEUKOCYTESUR in the last 72 hours.  Invalid input(s): APPERANCEUR    Imaging: IR  Fluoro Guide CV Line Right  Result Date: 01/01/2021 INDICATION: 70 year old with acute renal failure. Patient needs a non tunneled dialysis catheter. EXAM: FLUOROSCOPIC AND ULTRASOUND GUIDED PLACEMENT OF A NON-TUNNELED DIALYSIS CATHETER Physician: Stephan Minister. Anselm Pancoast, MD MEDICATIONS: Moderate sedation ANESTHESIA/SEDATION: Versed 0.5 mg IV; Fentanyl 25 mcg IV; Moderate Sedation Time:  10 minutes The patient was continuously monitored during the procedure by the interventional radiology nurse under my direct supervision. FLUOROSCOPY TIME:  Fluoroscopy Time: 6 seconds, 1 mGy COMPLICATIONS: None immediate. PROCEDURE: Informed consent was obtained for catheter placement. The patient was placed supine on the interventional table. Ultrasound confirmed a patent right common femoral vein. Ultrasound images were obtained for documentation. The right groin was prepped and draped in a sterile fashion. The right groin was anesthetized with 1% lidocaine. Maximal barrier sterile technique was utilized including caps, mask, sterile gowns, sterile gloves, sterile drape, hand hygiene and skin antiseptic. A small incision was made with #11 blade scalpel. A 21 gauge needle directed into the right common femoral vein with ultrasound guidance. A micropuncture dilator set was placed. A 24 cm Mahurkar catheter was selected. The catheter was advanced over a wire and positioned in the lower IVC region. Fluoroscopic images were obtained for documentation. Both dialysis lumens were found to aspirate and flush well. The proper amount of heparin  was flushed in both lumens. The central venous lumen was flushed with normal saline. Catheter was sutured to skin. FINDINGS: Catheter tip in the lower IVC region. IMPRESSION: Successful placement of a right femoral non-tunneled dialysis catheter using ultrasound and fluoroscopic guidance. Electronically Signed   By: Markus Daft M.D.   On: 01/01/2021 15:30   IR US Guide Vasc Access Right  Result Date:  01/01/2021 INDICATION: 70 year old with acute renal failure. Patient needs a non tunneled dialysis catheter. EXAM: FLUOROSCOPIC AND ULTRASOUND GUIDED PLACEMENT OF A NON-TUNNELED DIALYSIS CATHETER Physician: Stephan Minister. Anselm Pancoast, MD MEDICATIONS: Moderate sedation ANESTHESIA/SEDATION: Versed 0.5 mg IV; Fentanyl 25 mcg IV; Moderate Sedation Time:  10 minutes The patient was continuously monitored during the procedure by the interventional radiology nurse under my direct supervision. FLUOROSCOPY TIME:  Fluoroscopy Time: 6 seconds, 1 mGy COMPLICATIONS: None immediate. PROCEDURE: Informed consent was obtained for catheter placement. The patient was placed supine on the interventional table. Ultrasound confirmed a patent right common femoral vein. Ultrasound images were obtained for documentation. The right groin was prepped and draped in a sterile fashion. The right groin was anesthetized with 1% lidocaine. Maximal barrier sterile technique was utilized including caps, mask, sterile gowns, sterile gloves, sterile drape, hand hygiene and skin antiseptic. A small incision was made with #11 blade scalpel. A 21 gauge needle directed into the right common femoral vein with ultrasound guidance. A micropuncture dilator set was placed. A 24 cm Mahurkar catheter was selected. The catheter was advanced over a wire and positioned in the lower IVC region. Fluoroscopic images were obtained for documentation. Both dialysis lumens were found to aspirate and flush well. The proper amount of heparin was flushed in both lumens. The central venous lumen was flushed with normal saline. Catheter was sutured to skin. FINDINGS: Catheter tip in the lower IVC region. IMPRESSION: Successful placement of a right femoral non-tunneled dialysis catheter using ultrasound and fluoroscopic guidance. Electronically Signed   By: Markus Daft M.D.   On: 01/01/2021 15:30     Medications:    sodium chloride     amiodarone 30 mg/hr (01/02/21 1807)     Chlorhexidine Gluconate Cloth  6 each Topical Q0600   cilostazol  50 mg Oral BID   ezetimibe  10 mg Oral Daily   famotidine  20 mg Oral Daily   hydrALAZINE  25 mg Oral Q8H   insulin aspart  0-6 Units Subcutaneous TID WC   insulin aspart protamine- aspart  8 Units Subcutaneous Q breakfast   isosorbide mononitrate  30 mg Oral Daily   lidocaine  1 patch Transdermal Q24H   metoprolol succinate  50 mg Oral Daily   pantoprazole (PROTONIX) IV  40 mg Intravenous Daily   rosuvastatin  40 mg Oral Daily   sodium chloride flush  3 mL Intravenous Q12H   sodium chloride, acetaminophen, fentaNYL, heparin sodium (porcine), hydrALAZINE, hydrocortisone cream, HYDROmorphone (DILAUDID) injection, lidocaine, loperamide, loratadine, midazolam, Muscle Rub, naLOXone (NARCAN)  injection, sodium chloride, sodium chloride flush  Assessment/ Plan:  1.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 Now s/p left ureteral stent placement but with no improvement. Baseline Cr ~2.6, though recently discharged with a Cr of 3.4.  - Avoid RAAS blockers or MRA's -HD started for uremia & failed lasix challenge. Rt fem temp line placed with IR on 9/18.  Hemodialysis started 01/01/2021.  Will need labs.  Will assess dialysis needs.  Anticipate that she will require ongoing dialysis  support - Trend BMP, renal dose medications for GFR<15 - Monitor Daily I/Os, Daily weight  - Maintain MAP>65 for optimal renal perfusion.  -avoid further nephrotoxins including NSAIDS, Morphine. Unless absolutely necessary, avoid CT with contrast and/or MRI with gadolinium.    2.Hematuria Left obstructive uropathy-  -in the setting of mildly obstructing 6 mm distal left ureter renal stone seen on CT urogram. S/p left ureteral stent on 9/15. ANCA and lupus serologies wnl. Appreciate urology's assistance  3.Intertrochanteric hip fracture- s/p fall in the  hospital.  -ortho on board, awaiting resolution of acute issues first before proceeding with surgery -Preferred narcotic agents for pain control are hydromorphone, fentanyl, and methadone. Morphine should not be used. Avoid Baclofen   4.Epistaxis- overnight 9/14, since resolved. Thought to be due to nasal canula. Heparin on hold, continue to monitor. Anca serologies neg  5.Left renal artery stenosis- Hx of 80-90% proximal left renal artery stenosis. -as resp status and Afib improve, considering CO2 and stent of left renal artery however on hold given the fact that there is some contrast given. Once on HD, would consider proceeding with this since we will be monitoring for recovery  6.AHRF, acute on chronic dCHF exacerbation- likely flash pulmonary edema. Holding lasix in the setting of AKI. Remains on supplemental oxygen. Minimal urine output.    7.Hypertension- BP well controlled on norvasc, metoprolol, and hydralazine   8.Hyponatremia-  worsening in the setting of lasix and AKI.  -Continue fluid restriction 1.2L, managing with HD--137Na bath  9.Afib w/ RVR-on amio. Hep gtt on hold given epistaxis, which has resolved.   10.Anemia due to chronic kidney disease-Hgb stable at 8.3. Hep gtt on hold given nosebleeds and concern for GIB -Transfuse for Hgb<7 g/dL   11.Diabetes Mellitus Type 2 with Hyperglycemia- -per primary    LOS: [place] '@TODAY''@6'$ :37 AM

## 2021-01-03 NOTE — Progress Notes (Signed)
Pt's CBG was 53, gave pt 1 ampule of D50, MD notified and aware, will continue to monitor, Thanks Arvella Nigh RN.

## 2021-01-04 DIAGNOSIS — R935 Abnormal findings on diagnostic imaging of other abdominal regions, including retroperitoneum: Secondary | ICD-10-CM | POA: Diagnosis not present

## 2021-01-04 DIAGNOSIS — J9621 Acute and chronic respiratory failure with hypoxia: Secondary | ICD-10-CM | POA: Diagnosis not present

## 2021-01-04 DIAGNOSIS — J9622 Acute and chronic respiratory failure with hypercapnia: Secondary | ICD-10-CM | POA: Diagnosis not present

## 2021-01-04 DIAGNOSIS — D649 Anemia, unspecified: Secondary | ICD-10-CM | POA: Diagnosis not present

## 2021-01-04 LAB — RENAL FUNCTION PANEL
Albumin: 2.4 g/dL — ABNORMAL LOW (ref 3.5–5.0)
Anion gap: 12 (ref 5–15)
BUN: 30 mg/dL — ABNORMAL HIGH (ref 8–23)
CO2: 25 mmol/L (ref 22–32)
Calcium: 8 mg/dL — ABNORMAL LOW (ref 8.9–10.3)
Chloride: 96 mmol/L — ABNORMAL LOW (ref 98–111)
Creatinine, Ser: 3.39 mg/dL — ABNORMAL HIGH (ref 0.44–1.00)
GFR, Estimated: 14 mL/min — ABNORMAL LOW (ref >60)
Glucose, Bld: 146 mg/dL — ABNORMAL HIGH (ref 70–99)
Phosphorus: 3 mg/dL (ref 2.5–4.6)
Potassium: 3.6 mmol/L (ref 3.5–5.1)
Sodium: 133 mmol/L — ABNORMAL LOW (ref 135–145)

## 2021-01-04 LAB — GLUCOSE, CAPILLARY
Glucose-Capillary: 151 mg/dL — ABNORMAL HIGH (ref 70–99)
Glucose-Capillary: 188 mg/dL — ABNORMAL HIGH (ref 70–99)
Glucose-Capillary: 170 mg/dL — ABNORMAL HIGH (ref 70–99)
Glucose-Capillary: 114 mg/dL — ABNORMAL HIGH (ref 70–99)

## 2021-01-04 LAB — CBC
HCT: 26.8 % — ABNORMAL LOW (ref 36.0–46.0)
Hemoglobin: 8.3 g/dL — ABNORMAL LOW (ref 12.0–15.0)
MCH: 26.4 pg (ref 26.0–34.0)
MCHC: 31 g/dL (ref 30.0–36.0)
MCV: 85.4 fL (ref 80.0–100.0)
Platelets: 240 10*3/uL (ref 150–400)
RBC: 3.14 MIL/uL — ABNORMAL LOW (ref 3.87–5.11)
RDW: 15.9 % — ABNORMAL HIGH (ref 11.5–15.5)
WBC: 6.2 10*3/uL (ref 4.0–10.5)
nRBC: 0 % (ref 0.0–0.2)

## 2021-01-04 LAB — MAGNESIUM
Magnesium: 1.9 mg/dL (ref 1.7–2.4)
Magnesium: 1.9 mg/dL (ref 1.7–2.4)

## 2021-01-04 LAB — H. PYLORI ANTIBODY, IGG: H Pylori IgG: 0.28 Index Value (ref 0.00–0.79)

## 2021-01-04 MED ORDER — INSULIN ASPART 100 UNIT/ML IJ SOLN
0.0000 [IU] | Freq: Three times a day (TID) | INTRAMUSCULAR | Status: DC
  Administered 2021-01-04 – 2021-01-09 (×4): 1 [IU] via SUBCUTANEOUS

## 2021-01-04 MED ORDER — AMIODARONE HCL 200 MG PO TABS
200.0000 mg | ORAL_TABLET | Freq: Every day | ORAL | Status: DC
  Administered 2021-01-04 – 2021-01-17 (×13): 200 mg via ORAL
  Filled 2021-01-04 (×14): qty 1

## 2021-01-04 MED ORDER — CHLORHEXIDINE GLUCONATE CLOTH 2 % EX PADS
6.0000 | MEDICATED_PAD | Freq: Every day | CUTANEOUS | Status: DC
  Administered 2021-01-04 – 2021-01-11 (×6): 6 via TOPICAL

## 2021-01-04 MED ORDER — PANTOPRAZOLE SODIUM 40 MG PO TBEC
40.0000 mg | DELAYED_RELEASE_TABLET | Freq: Every day | ORAL | Status: DC
  Administered 2021-01-05 – 2021-01-16 (×12): 40 mg via ORAL
  Filled 2021-01-04 (×12): qty 1

## 2021-01-04 MED ORDER — ALTEPLASE 2 MG IJ SOLR
2.0000 mg | Freq: Once | INTRAMUSCULAR | Status: AC
  Administered 2021-01-05: 2 mg
  Filled 2021-01-04: qty 2

## 2021-01-04 MED ORDER — ROSUVASTATIN CALCIUM 5 MG PO TABS
10.0000 mg | ORAL_TABLET | Freq: Every day | ORAL | Status: DC
  Administered 2021-01-07 – 2021-01-17 (×10): 10 mg via ORAL
  Filled 2021-01-04 (×10): qty 2

## 2021-01-04 MED ORDER — CHLORHEXIDINE GLUCONATE CLOTH 2 % EX PADS
6.0000 | MEDICATED_PAD | Freq: Every day | CUTANEOUS | Status: DC
  Administered 2021-01-05 – 2021-01-17 (×10): 6 via TOPICAL

## 2021-01-04 NOTE — Progress Notes (Signed)
PROGRESS NOTE    Molly [name]  [id number] DOB: Jun 26, 1950 DOA: 12/27/2020 PCP: [name], MD   Brief Narrative:  70 year old female with past medical history of hypertension, left renal artery stenosis (A999333) diastolic CHF, CKD stage IV, CAD, PAD with bilateral SFA CTO's, anemia chronic disease diabetes presented on 12/27/2020 with 4 days of chest pain.  BP was uncontrolled and spiked as high as 234/208 in the ED.  Chest x-ray consistent with pulmonary edema.  Patient briefly required BiPAP.   Admitted to [facility] service with cardiology consulted. Patient developed A. fib with RVR on 9/13.  Assessment & Plan:   Principal Problem:   Acute on chronic respiratory failure with hypoxia and hypercapnia (HCC) Active Problems:   HTN (hypertension)   Type 2 diabetes mellitus with stage 3b chronic kidney disease, with long-term current use of insulin (HCC)   Elevated troponin   Acute on chronic heart failure with preserved ejection fraction (HFpEF) ([place])   CAP (community acquired pneumonia)   Anemia   Abnormal CT of the abdomen  Acute metabolic/uremic encephalopathy: Significantly improved after second dialysis, patient fully alert and oriented and having very reasonable conversation and remembers everything she was told by physicians this morning.  Husband at the bedside and very happy about it.  Acute respiratory failure with hypoxia secondary to recurrent flash pulmonary edema/acute systolic congestive heart failure: Echo shows reduced ejection fraction to 40 to 45% compared to the previous 1.  Cardiology on board and managing.  Due to rising creatinine, she is on diuretics holiday.  No plans for diuretics per cardiology.  She is improving with dialysis and her oxygen requirement is only 1 L after second session of dialysis as opposed to 4 L that she required before dialysis.  Fall in hospital/left hip fracture: CT left hip indicates left hip fracture.  Orthopedics on board.  Plan  for surgical repair once she is more stable.  Potentially on Thursday.  Hypertensive emergency with chest pain: No chest pain and blood pressure controlled now.  Continue amlodipine 10 mg p.o. daily, hydralazine 25 mg p.o. 3 times daily Imdur to 30 mg p.o. daily, Toprol-XL 50 mg p.o. daily.  Left renal artery stenosis: Has a history of 80 to 90% of proximal left renal artery stenosis.  Plan was to do angiogram and stent but due to rising creatinine, this plan is on hold for now.  Cardiology and nephrology managing.  New onset A. fib with RVR: Developed overnight 12/28/2020.  She was initially started on amiodarone drip and converted back to sinus rhythm. Not on any anticoagulation due to slight drop in hemoglobin.  However hemoglobin has remained stable since 12/30/2020.  Will defer to cardiology about timing of resuming heparin but perhaps they are waiting for EGD to be completed.  Now she has been transitioned to oral amiodarone since she is taking p.o.  Possible gastroduodenitis: CT abdomen suggests possible gastroduodenitis.  Seen by GI.  They recommend EGD.  Patient is now more awake and perhaps is stable for EGD.  Will discuss with GI and recommend planning EGD tomorrow.  Continue daily PPI.  Epistaxis: Nosebleed overnight 12/30/2018, no further episodes.  Heparin was stopped and has remained on hold since then.  AKI on CKD stage IV/progressive renal failure/obstructive uropathy/left ureteral stone/left hydronephrosis/dross hematuria: CT renal stone yesterday shows 6 mm of left ureteral stone causing obstruction/hydronephrosis.  Patient underwent cystoscopy with left ureteral stent placement on 12/30/2020.  TDC was placed on 01/01/2021 and she received first session  of dialysis and second on 01/03/2021.  Per nephrology, next dialysis tomorrow and they suspect permanent dialysis now.  Hyponatremia: Improving.  133 today.  Community-acquired pneumonia: Completed 5 days of Rocephin on  12/31/2020.  Elevated troponin/demand ischemia: In the setting of hypertensive crisis.  EKG without ischemic changes.  Likely demand ischemia. 397 > 376.  Cardiology on board.  No plan for intervention.  Type 2 diabetes mellitus: Recent hemoglobin A1c 8.2% on 11/27/2020.  Home regimen includes Humulin 70/30 mix 8 to 12 units daily.  Due to her n.p.o. status and hypoglycemia, 70/30 was discontinued.  She is on SSI and blood sugar controlled.  Watch closely.  Dyslipidemia: Continue Zetia and Crestor.  PAD: Continue statin and Pletal.  Anemia of chronic disease: Hemoglobin has remained stable since 12/30/2020.  CT renal stone indicates possible gastritis or gastric ulcer.  GI on board.  GERD: Continue PPI.  Moderate protein calorie malnutrition: Patient looks malnourished and she has not had p.o. intake since last 3-4 days.  Seen by nutrition yesterday and they recommended starting tube feeds but patient was not dialysis all day yesterday so cortrak could not be placed.  Patient is now fully alert and oriented and can take p.o. so we will will hold off to that plan.  Abdominal tenderness: Patient exhibited abdominal tenderness yesterday so CT abdomen was ordered but fortunately, that was not done.  Perhaps this was secondary to constipation as she has had bowel movement and she has no abdominal pain or tenderness today.  We will discontinue CT abdomen.  DVT prophylaxis: heparin injection 5,000 Units Start: 01/03/21 1400   Code Status: Full Code  Family Communication: Patient's husband present at bedside.  Plan of care discussed with him in length.    Status is: Inpatient  Remains inpatient appropriate because:Ongoing diagnostic testing needed not appropriate for outpatient work up  Dispo: The patient is from: Home              Anticipated d/c is to: Home              Patient currently is not medically stable to d/c.   Difficult to place patient No        Estimated body mass index is  27.1 kg/m as calculated from the following:   Height as of this encounter: '5\' 2"'$  (1.575 m).   Weight as of this encounter: 67.2 kg.     Nutritional Assessment: Body mass index is 27.1 kg/m.Marland Kitchen Seen by dietician.  I agree with the assessment and plan as outlined below: Nutrition Status: Nutrition Problem: Inadequate oral intake Etiology: inability to eat Signs/Symptoms: NPO status Interventions: Tube feeding Skin Assessment: I have examined the patient's skin and I agree with the wound assessment as performed by the wound care RN as outlined below:    Consultants:  Nephrology Cardiology Orthopedic Urology-signed off GI  Procedures:  As above  Antimicrobials:  Anti-infectives (From admission, onward)    Start     Dose/Rate Route Frequency Ordered Stop   12/31/20 1330  cefTRIAXone (ROCEPHIN) 1 g in sodium chloride 0.9 % 100 mL IVPB        1 g 200 mL/hr over 30 Minutes Intravenous  Once 12/31/20 1315 12/31/20 1350   12/31/20 1000  cefTRIAXone (ROCEPHIN) 1 g in sodium chloride 0.9 % 100 mL IVPB  Status:  Discontinued        1 g 200 mL/hr over 30 Minutes Intravenous Every 24 hours 12/30/20 1202 12/31/20 1315   12/27/20 1530  cefTRIAXone (ROCEPHIN) 2 g in sodium chloride 0.9 % 100 mL IVPB  Status:  Discontinued        2 g 200 mL/hr over 30 Minutes Intravenous Every 24 hours 12/27/20 1435 12/30/20 1202          Subjective: Patient seen and examined.  Husband at bedside.  Patient fully alert and oriented and having meaningful conversations.  She has no complaints.  She is very grateful for the care provided.  Objective: Vitals:   01/04/21 0631 01/04/21 0658 01/04/21 0700 01/04/21 0820  BP:    (!) 151/59  Pulse:    69  Resp:    15  Temp:    97.7 F (36.5 C)  TempSrc:    Oral  SpO2: 94% (!) 86% 95% 96%  Weight:      Height:        Intake/Output Summary (Last 24 hours) at 01/04/2021 1124 Last data filed at 01/04/2021 0846 Gross per 24 hour  Intake 420 ml  Output  1500 ml  Net -1080 ml    Filed Weights   01/03/21 1344 01/03/21 1630 01/04/21 0428  Weight: 69.3 kg 67.8 kg 67.2 kg    Examination: General exam: Appears calm and comfortable  Respiratory system: Slightly diminished breath sounds at the bases bilaterally. Respiratory effort normal. Cardiovascular system: S1 & S2 heard, RRR. No JVD, murmurs, rubs, gallops or clicks. No pedal edema. Gastrointestinal system: Abdomen is nondistended, soft and nontender. No organomegaly or masses felt. Normal bowel sounds heard. Central nervous system: Alert and oriented. No focal neurological deficits. Extremities: Symmetric 5 x 5 power. Skin: No rashes, lesions or ulcers.  Psychiatry: Judgement and insight appear normal. Mood & affect appropriate.   Data Reviewed: I have personally reviewed following labs and imaging studies  CBC: Recent Labs  Lab 01/01/21 0242 01/02/21 0211 01/03/21 0803 01/03/21 1225 01/04/21 0435  WBC 8.9 8.1 6.8 6.5 6.2  HGB 8.7* 8.3* 8.6* 8.2* 8.3*  HCT 26.8* 25.4* 27.3* 25.9* 26.8*  MCV 84.3 83.3 84.0 84.4 85.4  PLT 355 316 380 366 [id number]    Basic Metabolic Panel: Recent Labs  Lab 12/29/20 0332 12/30/20 0353 12/31/20 0225 01/01/21 0242 01/03/21 0803 01/03/21 1815 01/03/21 2053 01/04/21 0435  NA 126*   < > 126* 125* 134*  --  135 133*  K 3.7   < > 4.4 4.8 3.6  --  3.6 3.6  CL 84*   < > 84* 84* 93*  --  98 96*  CO2 26   < > 22 20* 24  --  24 25  GLUCOSE 160*   < > 130* 203* 110*  --  131* 146*  BUN 49*   < > 64* 76* 48*  --  26* 30*  CREATININE 4.28*   < > 5.56* 6.50* 4.89*  --  3.21* 3.39*  CALCIUM 8.2*   < > 7.8* 7.5* 8.6*  --  8.0* 8.0*  MG 2.1  --   --   --  2.0 1.9  --  1.9  PHOS  --   --   --  6.7*  --   --  2.6 3.0   < > = values in this interval not displayed.    GFR: Estimated Creatinine Clearance: 14.3 mL/min (A) (by C-G formula based on SCr of 3.39 mg/dL (H)). Liver Function Tests: Recent Labs  Lab 01/01/21 0242 01/03/21 0803 01/03/21 2053  01/04/21 0435  AST  --  114*  --   --  ALT  --  100*  --   --   ALKPHOS  --  93  --   --   BILITOT  --  0.7  --   --   PROT  --  6.7  --   --   ALBUMIN 2.9* 2.7* 2.4* 2.4*    No results for input(s): LIPASE, AMYLASE in the last 168 hours. No results for input(s): AMMONIA in the last 168 hours. Coagulation Profile: No results for input(s): INR, PROTIME in the last 168 hours. Cardiac Enzymes: No results for input(s): CKTOTAL, CKMB, CKMBINDEX, TROPONINI in the last 168 hours. BNP (last 3 results) No results for input(s): PROBNP in the last 8760 hours. HbA1C: No results for input(s): HGBA1C in the last 72 hours. CBG: Recent Labs  Lab 01/03/21 0831 01/03/21 1202 01/03/21 1742 01/03/21 2117 01/04/21 0614  GLUCAP 97 107* 106* 129* 151*    Lipid Profile: No results for input(s): CHOL, HDL, LDLCALC, TRIG, CHOLHDL, LDLDIRECT in the last 72 hours. Thyroid Function Tests: No results for input(s): TSH, T4TOTAL, FREET4, T3FREE, THYROIDAB in the last 72 hours. Anemia Panel: No results for input(s): VITAMINB12, FOLATE, FERRITIN, TIBC, IRON, RETICCTPCT in the last 72 hours. Sepsis Labs: No results for input(s): PROCALCITON, LATICACIDVEN in the last 168 hours.   Recent Results (from the past 240 hour(s))  Resp Panel by RT-PCR (Flu A&B, Covid) Nasopharyngeal Swab     Status: None   Collection Time: 12/27/20  8:53 AM   Specimen: Nasopharyngeal Swab; Nasopharyngeal(NP) swabs in vial transport medium  Result Value Ref Range Status   SARS Coronavirus 2 by RT PCR NEGATIVE NEGATIVE Final    Comment: (NOTE) SARS-CoV-2 target nucleic acids are NOT DETECTED.  The SARS-CoV-2 RNA is generally detectable in upper respiratory specimens during the acute phase of infection. The lowest concentration of SARS-CoV-2 viral copies this assay can detect is 138 copies/mL. A negative result does not preclude SARS-Cov-2 infection and should not be used as the sole basis for treatment or other patient  management decisions. A negative result may occur with  improper specimen collection/handling, submission of specimen other than nasopharyngeal swab, presence of viral mutation(s) within the areas targeted by this assay, and inadequate number of viral copies(<138 copies/mL). A negative result must be combined with clinical observations, patient history, and epidemiological information. The expected result is Negative.  Fact Sheet for Patients:  EntrepreneurPulse.com.au  Fact Sheet for Healthcare Providers:  IncredibleEmployment.be  This test is no t yet approved or cleared by the [place] FDA and  has been authorized for detection and/or diagnosis of SARS-CoV-2 by FDA under an Emergency Use Authorization (EUA). This EUA will remain  in effect (meaning this test can be used) for the duration of the COVID-19 declaration under Section 564(b)(1) of the Act, 21 U.S.C.section 360bbb-3(b)(1), unless the authorization is terminated  or revoked sooner.       Influenza A by PCR NEGATIVE NEGATIVE Final   Influenza B by PCR NEGATIVE NEGATIVE Final    Comment: (NOTE) The Xpert Xpress SARS-CoV-2/FLU/RSV plus assay is intended as an aid in the diagnosis of influenza from Nasopharyngeal swab specimens and should not be used as a sole basis for treatment. Nasal washings and aspirates are unacceptable for Xpert Xpress SARS-CoV-2/FLU/RSV testing.  Fact Sheet for Patients: EntrepreneurPulse.com.au  Fact Sheet for Healthcare Providers: IncredibleEmployment.be  This test is not yet approved or cleared by the [place] FDA and has been authorized for detection and/or diagnosis of SARS-CoV-2 by FDA under an Emergency  Use Authorization (EUA). This EUA will remain in effect (meaning this test can be used) for the duration of the COVID-19 declaration under Section 564(b)(1) of the Act, 21 U.S.C. section 360bbb-3(b)(1),  unless the authorization is terminated or revoked.  Performed at [facility], [place] [address], [place], [place] [address]        Radiology Studies: No results found.  Scheduled Meds:  amiodarone  200 mg Oral Daily   Chlorhexidine Gluconate Cloth  6 each Topical Daily   cilostazol  50 mg Oral BID   ezetimibe  10 mg Oral Daily   famotidine  20 mg Oral Daily   feeding supplement (PROSource TF)  45 mL Per Tube TID   heparin injection (subcutaneous)  5,000 Units Subcutaneous Q8H   hydrALAZINE  25 mg Oral Q8H   insulin aspart  0-6 Units Subcutaneous TID WC   isosorbide mononitrate  30 mg Oral Daily   lidocaine  1 patch Transdermal Q24H   metoprolol succinate  50 mg Oral Daily   multivitamin  1 tablet Oral QHS   [START ON 01/05/2021] pantoprazole  40 mg Oral QHS   [START ON 01/05/2021] rosuvastatin  10 mg Oral Daily   sodium chloride flush  3 mL Intravenous Q12H   Continuous Infusions:  sodium chloride     feeding supplement (OSMOLITE 1.5 CAL) Stopped (01/04/21 1118)     LOS: 8 days   Time spent: 31 minutes   Darliss Cheney, MD [facility]  01/04/2021, 11:24 AM  Please page via Shea Evans and do not message via secure chat for anything urgent. Secure chat can be used for anything non urgent and I will respond at my earliest availability.  How to contact the [facility] Attending or Consulting provider [place] or covering provider during after hours [place], for this patient?  Check the care team in [facility] and look for a) attending/consulting [facility] provider listed and b) the [facility] team listed. Page or secure chat [phone]. Log into [email or web address] and use [place] universal password to access. If you do not have the password, please contact the hospital operator. Locate the [facility] provider you are looking for under [facility] and page to a number that you can be directly reached. If you still have difficulty reaching the provider, please page the [facility] (Director on Call) for the  Hospitalists listed on amion for assistance.

## 2021-01-04 NOTE — Progress Notes (Signed)
Pt awake and with improved mentation s/p HD today. Alert and oriented x2-3 (person, time, somewhat situation), however dozes off at times. Pt mostly awake while husband present (stating need to use the restroom, commenting on the football game scores on TV and missing Dancing with the Stars). Pt has been taking PO meds whole (one at a time) with sips of juice since atleast earlier today.  Diet advanced for breakfast per standing order.

## 2021-01-04 NOTE — Progress Notes (Signed)
Cardiology Progress Note  Patient ID: Molly [name] MRN: [id number] DOB: December 08, 1950 Date of Encounter: 01/04/2021  Primary Cardiologist: Jenne Campus, MD  Subjective   Chief Complaint: Left hip pain  HPI: Mental status much improved with hemodialysis.  Reports left hip pain.  Maintaining sinus rhythm.  Tolerating oral intake.  ROS:  All other ROS reviewed and negative. Pertinent positives noted in the HPI.     Inpatient Medications  Scheduled Meds:  amiodarone  200 mg Oral Daily   Chlorhexidine Gluconate Cloth  6 each Topical Daily   cilostazol  50 mg Oral BID   ezetimibe  10 mg Oral Daily   famotidine  20 mg Oral Daily   feeding supplement (PROSource TF)  45 mL Per Tube TID   heparin injection (subcutaneous)  5,000 Units Subcutaneous Q8H   hydrALAZINE  25 mg Oral Q8H   insulin aspart  0-6 Units Subcutaneous TID WC   isosorbide mononitrate  30 mg Oral Daily   lidocaine  1 patch Transdermal Q24H   metoprolol succinate  50 mg Oral Daily   multivitamin  1 tablet Oral QHS   pantoprazole (PROTONIX) IV  40 mg Intravenous Daily   rosuvastatin  40 mg Oral Daily   sodium chloride flush  3 mL Intravenous Q12H   Continuous Infusions:  sodium chloride     feeding supplement (OSMOLITE 1.5 CAL)     PRN Meds: sodium chloride, acetaminophen, fentaNYL, heparin sodium (porcine), hydrALAZINE, hydrocortisone cream, HYDROmorphone (DILAUDID) injection, lidocaine, loperamide, loratadine, midazolam, Muscle Rub, naLOXone (NARCAN)  injection, sodium chloride, sodium chloride flush   Vital Signs   Vitals:   01/04/21 0631 01/04/21 0658 01/04/21 0700 01/04/21 0820  BP:    (!) 151/59  Pulse:    69  Resp:    15  Temp:    97.7 F (36.5 C)  TempSrc:    Oral  SpO2: 94% (!) 86% 95% 96%  Weight:      Height:        Intake/Output Summary (Last 24 hours) at 01/04/2021 0939 Last data filed at 01/04/2021 0846 Gross per 24 hour  Intake 420 ml  Output 1500 ml  Net -1080 ml   Last 3 Weights  01/04/2021 01/03/2021 01/03/2021  Weight (lbs) 148 lb 2.4 oz 149 lb 7.6 oz 152 lb 12.5 oz  Weight (kg) 67.2 kg 67.8 kg 69.3 kg      Telemetry  Overnight telemetry shows sinus rhythm in the 70s, which I personally reviewed.   Physical Exam   Vitals:   01/04/21 0631 01/04/21 0658 01/04/21 0700 01/04/21 0820  BP:    (!) 151/59  Pulse:    69  Resp:    15  Temp:    97.7 F (36.5 C)  TempSrc:    Oral  SpO2: 94% (!) 86% 95% 96%  Weight:      Height:        Intake/Output Summary (Last 24 hours) at 01/04/2021 0939 Last data filed at 01/04/2021 0846 Gross per 24 hour  Intake 420 ml  Output 1500 ml  Net -1080 ml    Last 3 Weights 01/04/2021 01/03/2021 01/03/2021  Weight (lbs) 148 lb 2.4 oz 149 lb 7.6 oz 152 lb 12.5 oz  Weight (kg) 67.2 kg 67.8 kg 69.3 kg    Body mass index is 27.1 kg/m.  General: Well nourished, well developed, in no acute distress Head: Atraumatic, normal size  Eyes: PEERLA, EOMI  Neck: Supple, no JVD Endocrine: No thryomegaly Cardiac: Normal S1, S2;  RRR; no murmurs, rubs, or gallops Lungs: Crackles at the lung bases Abd: Soft, nontender, no hepatomegaly  Ext: No edema, pulses 2+ Musculoskeletal: No deformities, BUE and BLE strength normal and equal Skin: Warm and dry, no rashes   Neuro: Alert and oriented to person, place, time, and situation, CNII-XII grossly intact, no focal deficits  Psych: Normal mood and affect   Labs  High Sensitivity Troponin:   Recent Labs  Lab 12/27/20 0640 12/27/20 0835  TROPONINIHS 397* 376*     Cardiac EnzymesNo results for input(s): TROPONINI in the last 168 hours. No results for input(s): TROPIPOC in the last 168 hours.  Chemistry Recent Labs  Lab 01/03/21 0803 01/03/21 2053 01/04/21 0435  NA 134* 135 133*  K 3.6 3.6 3.6  CL 93* 98 96*  CO2 '24 24 25  '$ GLUCOSE 110* 131* 146*  BUN 48* 26* 30*  CREATININE 4.89* 3.21* 3.39*  CALCIUM 8.6* 8.0* 8.0*  PROT 6.7  --   --   ALBUMIN 2.7* 2.4* 2.4*  AST 114*  --   --   ALT  100*  --   --   ALKPHOS 93  --   --   BILITOT 0.7  --   --   GFRNONAA 9* 15* 14*  ANIONGAP 17* 13 12    Hematology Recent Labs  Lab 01/03/21 0803 01/03/21 1225 01/04/21 0435  WBC 6.8 6.5 6.2  RBC 3.25* 3.07* 3.14*  HGB 8.6* 8.2* 8.3*  HCT 27.3* 25.9* 26.8*  MCV 84.0 84.4 85.4  MCH 26.5 26.7 26.4  MCHC 31.5 31.7 31.0  RDW 15.4 15.6* 15.9*  PLT 380 366 240   BNPNo results for input(s): BNP, PROBNP in the last 168 hours.  DDimer No results for input(s): DDIMER in the last 168 hours.   Radiology  No results found.  Cardiac Studies  TTE 12/28/2020  1. Compared with the echo A999333, systolic function is reduced. Left  ventricular ejection fraction, by estimation, is 40 to 45%. The left  ventricle has mildly decreased function. The left ventricle demonstrates  global hypokinesis. Left ventricular  diastolic parameters are indeterminate.   2. Right ventricular systolic function is normal. The right ventricular  size is normal. There is moderately elevated pulmonary artery systolic  pressure.   3. The pericardial effusion is circumferential.   4. The mitral valve is normal in structure. Mild mitral valve  regurgitation. No evidence of mitral stenosis.   5. The aortic valve is tricuspid. There is mild calcification of the  aortic valve. There is mild thickening of the aortic valve. Aortic valve  regurgitation is not visualized. No aortic stenosis is present.   6. The inferior vena cava is dilated in size with <50% respiratory  variability, suggesting right atrial pressure of 15 mmHg.   Patient Profile  70 year old female with history of CKD stage IV, diastolic heart failure, PAD (bilateral SFA CTO's), left renal artery stenosis (90%), diabetes, CAD who was admitted on 12/27/2020 with chest pain and acute hypoxic respiratory failure secondary to pulmonary edema from likely hypertensive crisis.  Course complicated by A. fib with RVR 12/28/2020.   Assessment & Plan   #A. fib with  RVR -Very difficult to control rates amiodarone was decided. -Was in brief A. fib and converted back to sinus rhythm. -She is tolerating oral intake.  Transition to 200 mg of p.o. amiodarone.  She will continue this daily. -Holding Eliquis due to drop in hemoglobin concern for gastritis on CT.  Also had nosebleeds.  Hemoglobin has been stable.  GI is evaluating.  #Acute blood loss anemia #Nosebleed #Gastritis -Hemoglobin did drop on anticoagulation.  We are holding this.  She is on DVT prophylaxis. -GI will evaluate her with a EGD at some point.  She is more alert and stable.  Hopefully in the next few days.  #Acute hypoxic respiratory failure #CKD stage IV with AKI #Recurrent flash pulmonary edema #Systolic heart failure, 40 to 45% #Volume overload/hyponatremia/obstructive uropathy #Left renal artery stenosis -Admitted with recurrent flash pulmonary edema and hypertensive crisis. -She has a left renal artery stenosis 80-90%.  Suspect this will be addressed at some point.  Currently not.  She is on dialysis and seems to be improving. -She has progressed to AKI requiring dialysis.  We will continue with this for now.  Nephrology is treating this. -EF is now 40-45%.  Suspect this is arrhythmia related. -Plan for medical management. -No chest pain symptoms.  She does have coronary calcium on CT.  Will need an ischemia evaluation at some point but for now I favor medical management. -Continue metoprolol succinate 50 mg daily.  Continue hydralazine 25 mg 3 times daily, Imdur 30 mg daily.  No ACE/ARB/Arni/MRA given CKD stage IV.  #Elevated troponin -Demand.  No concern for ACS.  #Left hip fracture -Fall this admission.  Will need operative management.  For questions or updates, please contact [place] Please consult [email or web address] for contact info under   Time Spent with Patient: I have spent a total of 25 minutes with patient reviewing hospital notes, telemetry, EKGs, labs and  examining the patient as well as establishing an assessment and plan that was discussed with the patient.  > 50% of time was spent in direct patient care.    Signed, Addison Naegeli. Audie Box, MD, [place]  01/04/2021 9:39 AM

## 2021-01-04 NOTE — Progress Notes (Addendum)
Daily Rounding Note  01/04/2021, 11:40 AM  LOS: 8 days   SUBJECTIVE:   Chief complaint:     Patient had a brown stool yesterday.  She denies abdominal pain or nausea.  Tolerating solid food.  Mental status much improved over last 24 hours.  OBJECTIVE:         Vital signs in last 24 hours:    Temp:  [97.5 F (36.4 C)-98.5 F (36.9 C)] 97.7 F (36.5 C) (09/20 0820) Pulse Rate:  [69-81] 69 (09/20 0820) Resp:  [15-26] 15 (09/20 0820) BP: (131-156)/(49-68) 151/59 (09/20 0820) SpO2:  [86 %-100 %] 96 % (09/20 0820) Weight:  [67.2 kg-69.3 kg] 67.2 kg (09/20 0428) Last BM Date: 01/03/21 Filed Weights   01/03/21 1344 01/03/21 1630 01/04/21 0428  Weight: 69.3 kg 67.8 kg 67.2 kg   General: A bit sleepy but arouses easily and holds a conversation Heart: RRR. Chest: No labored breathing or cough. Abdomen: Soft without tenderness.  Active bowel sounds.  No distention. Extremities: No CCE. Neuro/Psych: Alert.  Appropriate.  Follows commands.  No tremor or gross deficits  Lab Results: Recent Labs    01/03/21 0803 01/03/21 1225 01/04/21 0435  WBC 6.8 6.5 6.2  HGB 8.6* 8.2* 8.3*  HCT 27.3* 25.9* 26.8*  PLT 380 366 240   BMET Recent Labs    01/03/21 0803 01/03/21 2053 01/04/21 0435  NA 134* 135 133*  K 3.6 3.6 3.6  CL 93* 98 96*  CO2 _0 GLUCOSE 110* 131* 146*  BUN 48* 26* 30*  CREATININE 4.89* 3.21* 3.39*  CALCIUM 8.6* 8.0* 8.0*   LFT Recent Labs    01/03/21 0803 01/03/21 2053 01/04/21 0435  PROT 6.7  --   --   ALBUMIN 2.7* 2.4* 2.4*  AST 114*  --   --   ALT 100*  --   --   ALKPHOS 93  --   --   BILITOT 0.7  --   --       ASSESMENT:   Normocytic anemia.  In conjunction with new anticoagulation, hematuria, AKI/CKD.  Unremarkable colonoscopy 2017.  Renal stone CT scan 9/14 with gastric antral inflammation, duodenal inflammation, possible ulcer disease.  Continues on Protonix, oral.   AMS in  setting of uremia.  Improved following dialysis sessions.  Inpatient fall sustaining left hip fracture 9/14.  Up to the present time Ortho has not felt she would be suitable for surgery.  New elevation transaminases with normal alk phos and T bili.  Noncontrast CT renal stone study does not demonstrate any hepato biliary issues.  Hepatitis B surface Ab reactive.  Surface Ag negative. Hep B -post: 258.     PLAN      EGD tomorrow 0830.  Hepatic fx profile in AM    Azucena Freed  01/04/2021, 11:40 AM Phone [place] Attending   I have taken an interval history, reviewed the chart and examined the patient. I agree with the Advanced Practitioner's note, impression and recommendations.  Majority the medical decision-making in the formulation of the assessment and plan were performed by me.   She needs an EGD to sort out whether or not she actually has peptic ulcer disease or significant lesions in the stomach or upper duodenum that would lead to GI bleeding in the face of expected anticoagulation.  The risks and benefits as well as alternatives of endoscopic procedure(s) have been discussed  and reviewed. All questions answered. The patient agrees to proceed.   Transaminitis might have been due to hypotension and other issues.  Would not embark on a big work-up we would follow these for right now as we planned.  Gatha Mayer, MD, [facility] 01/04/2021 3:13 PM

## 2021-01-04 NOTE — Progress Notes (Signed)
Pt currently refuses PIV assessment/placement

## 2021-01-04 NOTE — Progress Notes (Signed)
[facility] ROUNDING NOTE   Subjective:   Interval History: 70 year old lady with history of hypertension left renal artery stenosis [id number] diastolic dysfunction chronic kidney disease stage IV peripheral artery disease bilateral  superficial femoral artery obstruction.  Anemia, diabetes.  Presented 12/27/2020 with chest pain.  Was also found to have uncontrolled hypertension.  Blood pressure 151/59 pulse 70 temperature 97.7 O2 sats 96% nasal cannula last dialysis 01/03/2021 with 1.5 L removed.  Next dialysis will be 01/05/2021  Sodium 133 potassium 3.6 chloride 96 CO2 25 BUN 30 creatinine 3.3 glucose 146 calcium 8 phosphorus 3 magnesium 1.9 albumin 2.4 hemoglobin 8.3  Urine output 300 cc recorded 01/03/2021  Objective:  Vital signs in last 24 hours:  Temp:  [97.5 F (36.4 C)-98.5 F (36.9 C)] 97.7 F (36.5 C) (09/20 0820) Pulse Rate:  [69-81] 69 (09/20 0820) Resp:  [15-26] 15 (09/20 0820) BP: (131-162)/(49-74) 151/59 (09/20 0820) SpO2:  [86 %-100 %] 96 % (09/20 0820) Weight:  [67.2 kg-69.3 kg] 67.2 kg (09/20 0428)  Weight change: 0.8 kg Filed Weights   01/03/21 1344 01/03/21 1630 01/04/21 0428  Weight: 69.3 kg 67.8 kg 67.2 kg    Intake/Output: I/O last 3 completed shifts: In: 390 [P.O.:390] Out: 1800 [Urine:300; Other:1500]   Intake/Output this shift:  Total I/O In: 180 [P.O.:180] Out: -   Gen: in distress, yelling, laying flat in bed CVS:RRR, no murmurs Resp:crackles bibasilar, normal WOB [id number], nontender Ext:no edema Neuro: not orientable, yelling, moving all ext spontaneously   Basic Metabolic Panel: Recent Labs  Lab 12/29/20 0332 12/30/20 0353 12/31/20 0225 01/01/21 0242 01/03/21 0803 01/03/21 1815 01/03/21 2053 01/04/21 0435  NA 126*   < > 126* 125* 134*  --  135 133*  K 3.7   < > 4.4 4.8 3.6  --  3.6 3.6  CL 84*   < > 84* 84* 93*  --  98 96*  CO2 26   < > 22 20* 24  --  24 25  GLUCOSE 160*   < > 130* 203* 110*  --  131* 146*  BUN 49*    < > 64* 76* 48*  --  26* 30*  CREATININE 4.28*   < > 5.56* 6.50* 4.89*  --  3.21* 3.39*  CALCIUM 8.2*   < > 7.8* 7.5* 8.6*  --  8.0* 8.0*  MG 2.1  --   --   --  2.0 1.9  --  1.9  PHOS  --   --   --  6.7*  --   --  2.6 3.0   < > = values in this interval not displayed.     Liver Function Tests: Recent Labs  Lab 01/01/21 0242 01/03/21 0803 01/03/21 2053 01/04/21 0435  AST  --  114*  --   --   ALT  --  100*  --   --   ALKPHOS  --  93  --   --   BILITOT  --  0.7  --   --   PROT  --  6.7  --   --   ALBUMIN 2.9* 2.7* 2.4* 2.4*    No results for input(s): LIPASE, AMYLASE in the last 168 hours. No results for input(s): AMMONIA in the last 168 hours.  CBC: Recent Labs  Lab 01/01/21 0242 01/02/21 0211 01/03/21 0803 01/03/21 1225 01/04/21 0435  WBC 8.9 8.1 6.8 6.5 6.2  HGB 8.7* 8.3* 8.6* 8.2* 8.3*  HCT 26.8* 25.4* 27.3* 25.9* 26.8*  MCV 84.3  83.3 84.0 84.4 85.4  PLT 355 316 380 366 240     Cardiac Enzymes: No results for input(s): CKTOTAL, CKMB, CKMBINDEX, TROPONINI in the last 168 hours.  BNP: Invalid input(s): POCBNP  CBG: Recent Labs  Lab 01/03/21 0831 01/03/21 1202 01/03/21 1742 01/03/21 2117 01/04/21 0614  GLUCAP 97 107* 106* 129* 151*     Microbiology: Results for orders placed or performed during the hospital encounter of 12/27/20  Resp Panel by RT-PCR (Flu A&B, Covid) Nasopharyngeal Swab     Status: None   Collection Time: 12/27/20  8:53 AM   Specimen: Nasopharyngeal Swab; Nasopharyngeal(NP) swabs in vial transport medium  Result Value Ref Range Status   SARS Coronavirus 2 by RT PCR NEGATIVE NEGATIVE Final    Comment: (NOTE) SARS-CoV-2 target nucleic acids are NOT DETECTED.  The SARS-CoV-2 RNA is generally detectable in upper respiratory specimens during the acute phase of infection. The lowest concentration of SARS-CoV-2 viral copies this assay can detect is 138 copies/mL. A negative result does not preclude SARS-Cov-2 infection and should not  be used as the sole basis for treatment or other patient management decisions. A negative result may occur with  improper specimen collection/handling, submission of specimen other than nasopharyngeal swab, presence of viral mutation(s) within the areas targeted by this assay, and inadequate number of viral copies(<138 copies/mL). A negative result must be combined with clinical observations, patient history, and epidemiological information. The expected result is Negative.  Fact Sheet for Patients:  EntrepreneurPulse.com.au  Fact Sheet for Healthcare Providers:  IncredibleEmployment.be  This test is no t yet approved or cleared by the [place] FDA and  has been authorized for detection and/or diagnosis of SARS-CoV-2 by FDA under an Emergency Use Authorization (EUA). This EUA will remain  in effect (meaning this test can be used) for the duration of the COVID-19 declaration under Section 564(b)(1) of the Act, 21 U.S.C.section 360bbb-3(b)(1), unless the authorization is terminated  or revoked sooner.       Influenza A by PCR NEGATIVE NEGATIVE Final   Influenza B by PCR NEGATIVE NEGATIVE Final    Comment: (NOTE) The Xpert Xpress SARS-CoV-2/FLU/RSV plus assay is intended as an aid in the diagnosis of influenza from Nasopharyngeal swab specimens and should not be used as a sole basis for treatment. Nasal washings and aspirates are unacceptable for Xpert Xpress SARS-CoV-2/FLU/RSV testing.  Fact Sheet for Patients: EntrepreneurPulse.com.au  Fact Sheet for Healthcare Providers: IncredibleEmployment.be  This test is not yet approved or cleared by the [place] FDA and has been authorized for detection and/or diagnosis of SARS-CoV-2 by FDA under an Emergency Use Authorization (EUA). This EUA will remain in effect (meaning this test can be used) for the duration of the COVID-19 declaration under Section  564(b)(1) of the Act, 21 U.S.C. section 360bbb-3(b)(1), unless the authorization is terminated or revoked.  Performed at [facility], [place] [address], [place], [place] [address]     Coagulation Studies: No results for input(s): LABPROT, INR in the last 72 hours.  Urinalysis: No results for input(s): COLORURINE, LABSPEC, PHURINE, GLUCOSEU, HGBUR, BILIRUBINUR, KETONESUR, PROTEINUR, UROBILINOGEN, NITRITE, LEUKOCYTESUR in the last 72 hours.  Invalid input(s): APPERANCEUR    Imaging: No results found.   Medications:    sodium chloride     feeding supplement (OSMOLITE 1.5 CAL) Stopped (01/04/21 1118)    amiodarone  200 mg Oral Daily   Chlorhexidine Gluconate Cloth  6 each Topical Daily   cilostazol  50 mg Oral BID  ezetimibe  10 mg Oral Daily   famotidine  20 mg Oral Daily   feeding supplement (PROSource TF)  45 mL Per Tube TID   heparin injection (subcutaneous)  5,000 Units Subcutaneous Q8H   hydrALAZINE  25 mg Oral Q8H   insulin aspart  0-6 Units Subcutaneous TID WC   isosorbide mononitrate  30 mg Oral Daily   lidocaine  1 patch Transdermal Q24H   metoprolol succinate  50 mg Oral Daily   multivitamin  1 tablet Oral QHS   [START ON 01/05/2021] pantoprazole  40 mg Oral QHS   [START ON 01/05/2021] rosuvastatin  10 mg Oral Daily   sodium chloride flush  3 mL Intravenous Q12H   sodium chloride, acetaminophen, fentaNYL, heparin sodium (porcine), hydrALAZINE, hydrocortisone cream, HYDROmorphone (DILAUDID) injection, lidocaine, loperamide, loratadine, midazolam, Muscle Rub, naLOXone (NARCAN)  injection, sodium chloride, sodium chloride flush  Assessment/ Plan:  1.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 Now s/p left ureteral stent placement but with no improvement. Baseline Cr ~2.6, though recently discharged with a Cr of 3.4.  - Avoid RAAS blockers or MRA's -HD started for  uremia & failed lasix challenge. Rt fem temp line placed with IR on 9/18.  Hemodialysis started 01/01/2021.  Her last dialysis treatment was 01/03/2021.  Her next dialysis treatment will be 01/05/2021.  I would imagine that the dialysis needs again to be ongoing. - Trend BMP, renal dose medications for GFR<15 - Monitor Daily I/Os, Daily weight  - Maintain MAP>65 for optimal renal perfusion.  -avoid further nephrotoxins including NSAIDS, Morphine. Unless absolutely necessary, avoid CT with contrast and/or MRI with gadolinium.    2.Hematuria Left obstructive uropathy-  -in the setting of mildly obstructing 6 mm distal left ureter renal stone seen on CT urogram. S/p left ureteral stent on 9/15. ANCA and lupus serologies wnl. Appreciate urology's assistance  3.Intertrochanteric hip fracture- s/p fall in the hospital.  -ortho on board, awaiting resolution of acute issues first before proceeding with surgery -Preferred narcotic agents for pain control are hydromorphone, fentanyl, and methadone. Morphine should not be used. Avoid Baclofen   4.Epistaxis- overnight 9/14, since resolved. Thought to be due to nasal canula. Heparin on hold, continue to monitor. Anca serologies neg  5.Left renal artery stenosis- Hx of 80-90% proximal left renal artery stenosis. -as resp status and Afib improve, considering CO2 and stent of left renal artery however on hold given the fact that there is some contrast given. Once on HD, would consider proceeding with this since we will be monitoring for recovery  6.AHRF, acute on chronic dCHF exacerbation- likely flash pulmonary edema. Holding lasix in the setting of AKI. Remains on supplemental oxygen. Minimal urine output.    7.Hypertension- BP well controlled on norvasc, metoprolol, and hydralazine   8.Hyponatremia-  worsening in the setting of lasix and AKI.  -Continue fluid restriction 1.2L, managing with HD--137Na bath  9.Afib w/ RVR-on amio. Hep gtt on hold given  epistaxis, which has resolved.   10.Anemia due to chronic kidney disease-Hgb stable at 8.3. Hep gtt on hold given nosebleeds and concern for GIB -Transfuse for Hgb<7 g/dL   11.Diabetes Mellitus Type 2 with Hyperglycemia- -per primary    LOS: [place] '@TODAY''@11'$ :26 AM

## 2021-01-04 NOTE — Anesthesia Preprocedure Evaluation (Addendum)
Anesthesia Evaluation  Patient identified by MRN, date of birth, ID band Patient awake    Reviewed: Allergy & Precautions, NPO status , Patient's Chart, lab work & pertinent test results  Airway Mallampati: II  TM Distance: >3 FB Neck ROM: Full    Dental  (+) Teeth Intact   Pulmonary neg pulmonary ROS,    Pulmonary exam normal        Cardiovascular hypertension, Pt. on medications + CAD, + Peripheral Vascular Disease and +CHF   Rhythm:Regular Rate:Normal     Neuro/Psych negative neurological ROS  negative psych ROS   GI/Hepatic Neg liver ROS, GERD  Medicated,  Endo/Other  diabetes, Type 2, Insulin Dependent  Renal/GU negative Renal ROS  negative genitourinary   Musculoskeletal negative musculoskeletal ROS (+)   Abdominal (+)  Abdomen: soft.    Peds  Hematology  (+) anemia ,   Anesthesia Other Findings   Reproductive/Obstetrics                            Anesthesia Physical Anesthesia Plan  ASA: 3  Anesthesia Plan: MAC   Post-op Pain Management:    Induction: Intravenous  PONV Risk Score and Plan: 2 and Propofol infusion and Treatment may vary due to age or medical condition  Airway Management Planned: Simple Face Mask, Natural Airway and Nasal Cannula  Additional Equipment: None  Intra-op Plan:   Post-operative Plan:   Informed Consent: I have reviewed the patients History and Physical, chart, labs and discussed the procedure including the risks, benefits and alternatives for the proposed anesthesia with the patient or authorized representative who has indicated his/her understanding and acceptance.     Dental advisory given  Plan Discussed with: CRNA  Anesthesia Plan Comments: (Lab Results      Component                Value               Date                      WBC                      6.2                 01/04/2021                HGB                      8.3  (L)             01/04/2021                HCT                      26.8 (L)            01/04/2021                MCV                      85.4                01/04/2021                PLT  240                 01/04/2021           Lab Results      Component                Value               Date                      NA                       133 (L)             01/04/2021                K                        3.6                 01/04/2021                CO2                      25                  01/04/2021                GLUCOSE                  146 (H)             01/04/2021                BUN                      30 (H)              01/04/2021                CREATININE               3.39 (H)            01/04/2021                CALCIUM                  8.0 (L)             01/04/2021                GFRNONAA                 14 (L)              01/04/2021                GFRAA                    59 (L)              09/03/2019           ECHO 09/22: 1. Compared with the echo 11/1015, systolic function is reduced. Left  ventricular ejection fraction, by estimation, is 40 to 45%. The left  ventricle has mildly decreased function. The left ventricle demonstrates  global hypokinesis. Left ventricular  diastolic parameters are indeterminate.  2. Right ventricular systolic function is normal. The right ventricular  size is  normal. There is moderately elevated pulmonary artery systolic  pressure.  3. The pericardial effusion is circumferential.  4. The mitral valve is normal in structure. Mild mitral valve  regurgitation. No evidence of mitral stenosis.  5. The aortic valve is tricuspid. There is mild calcification of the  aortic valve. There is mild thickening of the aortic valve. Aortic valve  regurgitation is not visualized. No aortic stenosis is present.  6. The inferior vena cava is dilated in size with <50% respiratory  variability, suggesting right atrial  pressure of 15 mmHg.)       Anesthesia Quick Evaluation

## 2021-01-04 NOTE — Progress Notes (Signed)
IV team at the bedside and pt is refusing IV access and Lt AC iv be removed. Pts educated.

## 2021-01-04 NOTE — H&P (View-Only) (Signed)
Daily Rounding Note  01/04/2021, 11:40 AM  LOS: 8 days   SUBJECTIVE:   Chief complaint:     Patient had a brown stool yesterday.  She denies abdominal pain or nausea.  Tolerating solid food.  Mental status much improved over last 24 hours.  OBJECTIVE:         Vital signs in last 24 hours:    Temp:  [97.5 F (36.4 C)-98.5 F (36.9 C)] 97.7 F (36.5 C) (09/20 0820) Pulse Rate:  [69-81] 69 (09/20 0820) Resp:  [15-26] 15 (09/20 0820) BP: (131-156)/(49-68) 151/59 (09/20 0820) SpO2:  [86 %-100 %] 96 % (09/20 0820) Weight:  [67.2 kg-69.3 kg] 67.2 kg (09/20 0428) Last BM Date: 01/03/21 Filed Weights   01/03/21 1344 01/03/21 1630 01/04/21 0428  Weight: 69.3 kg 67.8 kg 67.2 kg   General: A bit sleepy but arouses easily and holds a conversation Heart: RRR. Chest: No labored breathing or cough. Abdomen: Soft without tenderness.  Active bowel sounds.  No distention. Extremities: No CCE. Neuro/Psych: Alert.  Appropriate.  Follows commands.  No tremor or gross deficits  Lab Results: Recent Labs    01/03/21 0803 01/03/21 1225 01/04/21 0435  WBC 6.8 6.5 6.2  HGB 8.6* 8.2* 8.3*  HCT 27.3* 25.9* 26.8*  PLT 380 366 240   BMET Recent Labs    01/03/21 0803 01/03/21 2053 01/04/21 0435  NA 134* 135 133*  K 3.6 3.6 3.6  CL 93* 98 96*  CO2 _0 GLUCOSE 110* 131* 146*  BUN 48* 26* 30*  CREATININE 4.89* 3.21* 3.39*  CALCIUM 8.6* 8.0* 8.0*   LFT Recent Labs    01/03/21 0803 01/03/21 2053 01/04/21 0435  PROT 6.7  --   --   ALBUMIN 2.7* 2.4* 2.4*  AST 114*  --   --   ALT 100*  --   --   ALKPHOS 93  --   --   BILITOT 0.7  --   --       ASSESMENT:   Normocytic anemia.  In conjunction with new anticoagulation, hematuria, AKI/CKD.  Unremarkable colonoscopy 2017.  Renal stone CT scan 9/14 with gastric antral inflammation, duodenal inflammation, possible ulcer disease.  Continues on Protonix, oral.   AMS in  setting of uremia.  Improved following dialysis sessions.  Inpatient fall sustaining left hip fracture 9/14.  Up to the present time Ortho has not felt she would be suitable for surgery.  New elevation transaminases with normal alk phos and T bili.  Noncontrast CT renal stone study does not demonstrate any hepato biliary issues.  Hepatitis B surface Ab reactive.  Surface Ag negative. Hep B -post: 258.     PLAN      EGD tomorrow 0830.  Hepatic fx profile in AM    Azucena Freed  01/04/2021, 11:40 AM Phone [place] Attending   I have taken an interval history, reviewed the chart and examined the patient. I agree with the Advanced Practitioner's note, impression and recommendations.  Majority the medical decision-making in the formulation of the assessment and plan were performed by me.   She needs an EGD to sort out whether or not she actually has peptic ulcer disease or significant lesions in the stomach or upper duodenum that would lead to GI bleeding in the face of expected anticoagulation.  The risks and benefits as well as alternatives of endoscopic procedure(s) have been discussed  and reviewed. All questions answered. The patient agrees to proceed.   Transaminitis might have been due to hypotension and other issues.  Would not embark on a big work-up we would follow these for right now as we planned.  Gatha Mayer, MD, [facility] 01/04/2021 3:13 PM

## 2021-01-05 ENCOUNTER — Inpatient Hospital Stay (HOSPITAL_COMMUNITY): Payer: Medicare Other | Admitting: Anesthesiology

## 2021-01-05 ENCOUNTER — Encounter (HOSPITAL_COMMUNITY): Payer: Self-pay | Admitting: Internal Medicine

## 2021-01-05 ENCOUNTER — Encounter (HOSPITAL_COMMUNITY)
Admission: EM | Disposition: A | Discharge status: Home Health | Payer: Self-pay | Source: Home / Self Care | Attending: Family Medicine

## 2021-01-05 DIAGNOSIS — J9622 Acute and chronic respiratory failure with hypercapnia: Secondary | ICD-10-CM | POA: Diagnosis not present

## 2021-01-05 DIAGNOSIS — K3189 Other diseases of stomach and duodenum: Secondary | ICD-10-CM | POA: Diagnosis not present

## 2021-01-05 DIAGNOSIS — K222 Esophageal obstruction: Secondary | ICD-10-CM | POA: Diagnosis not present

## 2021-01-05 DIAGNOSIS — J9621 Acute and chronic respiratory failure with hypoxia: Secondary | ICD-10-CM | POA: Diagnosis not present

## 2021-01-05 HISTORY — PX: BIOPSY: SHX5522

## 2021-01-05 HISTORY — PX: ESOPHAGOGASTRODUODENOSCOPY (EGD) WITH PROPOFOL: SHX5813

## 2021-01-05 LAB — CBC
HCT: 26 % — ABNORMAL LOW (ref 36.0–46.0)
Hemoglobin: 8.3 g/dL — ABNORMAL LOW (ref 12.0–15.0)
MCH: 27.2 pg (ref 26.0–34.0)
MCHC: 31.9 g/dL (ref 30.0–36.0)
MCV: 85.2 fL (ref 80.0–100.0)
Platelets: 269 10*3/uL (ref 150–400)
RBC: 3.05 MIL/uL — ABNORMAL LOW (ref 3.87–5.11)
RDW: 16.2 % — ABNORMAL HIGH (ref 11.5–15.5)
WBC: 8.1 10*3/uL (ref 4.0–10.5)
nRBC: 0 % (ref 0.0–0.2)

## 2021-01-05 LAB — RENAL FUNCTION PANEL
Albumin: 2.4 g/dL — ABNORMAL LOW (ref 3.5–5.0)
Anion gap: 11 (ref 5–15)
BUN: 34 mg/dL — ABNORMAL HIGH (ref 8–23)
CO2: 26 mmol/L (ref 22–32)
Calcium: 8.2 mg/dL — ABNORMAL LOW (ref 8.9–10.3)
Chloride: 96 mmol/L — ABNORMAL LOW (ref 98–111)
Creatinine, Ser: 4.01 mg/dL — ABNORMAL HIGH (ref 0.44–1.00)
GFR, Estimated: 12 mL/min — ABNORMAL LOW (ref >60)
Glucose, Bld: 122 mg/dL — ABNORMAL HIGH (ref 70–99)
Phosphorus: 2.9 mg/dL (ref 2.5–4.6)
Potassium: 3.6 mmol/L (ref 3.5–5.1)
Sodium: 133 mmol/L — ABNORMAL LOW (ref 135–145)

## 2021-01-05 LAB — MAGNESIUM: Magnesium: 1.9 mg/dL (ref 1.7–2.4)

## 2021-01-05 LAB — GLUCOSE, CAPILLARY
Glucose-Capillary: 119 mg/dL — ABNORMAL HIGH (ref 70–99)
Glucose-Capillary: 135 mg/dL — ABNORMAL HIGH (ref 70–99)
Glucose-Capillary: 114 mg/dL — ABNORMAL HIGH (ref 70–99)

## 2021-01-05 LAB — AST: AST: 86 U/L — ABNORMAL HIGH (ref 15–41)

## 2021-01-05 LAB — ALKALINE PHOSPHATASE: Alkaline Phosphatase: 91 U/L (ref 38–126)

## 2021-01-05 LAB — BILIRUBIN, TOTAL: Total Bilirubin: 0.6 mg/dL (ref 0.3–1.2)

## 2021-01-05 LAB — PROTEIN, TOTAL: Total Protein: 6.1 g/dL — ABNORMAL LOW (ref 6.5–8.1)

## 2021-01-05 LAB — BILIRUBIN, DIRECT: Bilirubin, Direct: <0.1 mg/dL (ref 0.0–0.2)

## 2021-01-05 LAB — ALT: ALT: 79 U/L — ABNORMAL HIGH (ref 0–44)

## 2021-01-05 SURGERY — ESOPHAGOGASTRODUODENOSCOPY (EGD) WITH PROPOFOL
Anesthesia: Monitor Anesthesia Care

## 2021-01-05 MED ORDER — SODIUM CHLORIDE 0.9 % IV SOLN: 100.0000 mL | INTRAVENOUS | Status: DC | PRN

## 2021-01-05 MED ORDER — PENTAFLUOROPROP-TETRAFLUOROETH EX AERO: 1.0000 "application " | INHALATION_SPRAY | CUTANEOUS | Status: DC | PRN

## 2021-01-05 MED ORDER — KETAMINE HCL 50 MG/5ML IJ SOSY
PREFILLED_SYRINGE | INTRAMUSCULAR | Status: AC
  Filled 2021-01-05: qty 5

## 2021-01-05 MED ORDER — LIDOCAINE 2% (20 MG/ML) 5 ML SYRINGE
INTRAMUSCULAR | Status: DC | PRN
  Administered 2021-01-05: 100 mg via INTRAVENOUS

## 2021-01-05 MED ORDER — ALTEPLASE 2 MG IJ SOLR
2.0000 mg | Freq: Once | INTRAMUSCULAR | Status: DC | PRN
  Filled 2021-01-05: qty 2

## 2021-01-05 MED ORDER — LIDOCAINE-PRILOCAINE 2.5-2.5 % EX CREA: 1.0000 "application " | TOPICAL_CREAM | CUTANEOUS | Status: DC | PRN

## 2021-01-05 MED ORDER — NEPRO/CARBSTEADY PO LIQD
237.0000 mL | Freq: Two times a day (BID) | ORAL | Status: DC
  Administered 2021-01-05 – 2021-01-12 (×6): 237 mL via ORAL

## 2021-01-05 MED ORDER — PROPOFOL 500 MG/50ML IV EMUL
INTRAVENOUS | Status: DC | PRN
  Administered 2021-01-05: 25 ug/kg/min via INTRAVENOUS

## 2021-01-05 MED ORDER — HEPARIN SODIUM (PORCINE) 1000 UNIT/ML DIALYSIS
1000.0000 [IU] | INTRAMUSCULAR | Status: DC | PRN
  Administered 2021-01-15: 1000 [IU] via INTRAVENOUS_CENTRAL
  Filled 2021-01-05 (×6): qty 1

## 2021-01-05 MED ORDER — SODIUM CHLORIDE 0.9 % IV SOLN: INTRAVENOUS | Status: DC | PRN

## 2021-01-05 MED ORDER — CEFAZOLIN SODIUM-DEXTROSE 2-4 GM/100ML-% IV SOLN
2.0000 g | INTRAVENOUS | Status: AC
  Administered 2021-01-06: 2 g via INTRAVENOUS
  Filled 2021-01-05: qty 100

## 2021-01-05 MED ORDER — KETAMINE HCL 10 MG/ML IJ SOLN
INTRAMUSCULAR | Status: DC | PRN
  Administered 2021-01-05: 20 mg via INTRAVENOUS

## 2021-01-05 MED ORDER — POVIDONE-IODINE 10 % EX SWAB: 2.0000 "application " | Freq: Once | CUTANEOUS | Status: DC

## 2021-01-05 MED ORDER — LIDOCAINE HCL (PF) 1 % IJ SOLN
5.0000 mL | INTRAMUSCULAR | Status: DC | PRN
  Filled 2021-01-05 (×2): qty 5

## 2021-01-05 MED ORDER — PROPOFOL 10 MG/ML IV BOLUS
INTRAVENOUS | Status: DC | PRN
  Administered 2021-01-05: 20 mg via INTRAVENOUS

## 2021-01-05 SURGICAL SUPPLY — 15 items

## 2021-01-05 NOTE — Progress Notes (Signed)
[facility] ROUNDING NOTE   Subjective:   Interval History: 70 year old lady with history of hypertension left renal artery stenosis [id number] diastolic dysfunction chronic kidney disease stage IV peripheral artery disease bilateral  superficial femoral artery obstruction.  Anemia, diabetes.  Presented 12/27/2020 with chest pain.  Was also found to have uncontrolled hypertension.  Blood pressure 1 6556 pulse 70 temperature 98.6 O2 sats 98% room air.  Last dialysis 01/03/2021 with 1.5 L removed.  Next dialysis will be 01/05/2021  Sodium 133  potassium 3.6 chloride 96 CO2 26 BUN 34 creatinine 4 glucose 122 calcium 8.2 albumin 2.4 phosphorus 2.9 hemoglobin 8.3  Urine output 300 cc recorded 01/03/2021  Objective:  Vital signs in last 24 hours:  Temp:  [97.5 F (36.4 C)-98.6 F (37 C)] 98.6 F (37 C) (09/21 0353) Pulse Rate:  [69-70] 70 (09/21 0353) Resp:  [15-20] 19 (09/21 0353) BP: (128-165)/(48-59) 165/56 (09/21 0556) SpO2:  [85 %-98 %] 98 % (09/21 0353) Weight:  [66.4 kg] 66.4 kg (09/21 0353)  Weight change: -2.9 kg Filed Weights   01/03/21 1630 01/04/21 0428 01/05/21 0353  Weight: 67.8 kg 67.2 kg 66.4 kg    Intake/Output: I/O last 3 completed shifts: In: 657 [P.O.:657] Out: -    Intake/Output this shift:  No intake/output data recorded.  Gen: in distress, yelling, laying flat in bed CVS:RRR, no murmurs Resp:crackles bibasilar, normal WOB [id number], nontender Ext:no edema Neuro: not orientable, yelling, moving all ext spontaneously   Basic Metabolic Panel: Recent Labs  Lab 01/01/21 0242 01/03/21 0803 01/03/21 1815 01/03/21 2053 01/04/21 0435 01/04/21 1732 01/05/21 0500  NA 125* 134*  --  135 133*  --  133*  K 4.8 3.6  --  3.6 3.6  --  3.6  CL 84* 93*  --  98 96*  --  96*  CO2 20* 24  --  24 25  --  26  GLUCOSE 203* 110*  --  131* 146*  --  122*  BUN 76* 48*  --  26* 30*  --  34*  CREATININE 6.50* 4.89*  --  3.21* 3.39*  --  4.01*  CALCIUM 7.5* 8.6*   --  8.0* 8.0*  --  8.2*  MG  --  2.0 1.9  --  1.9 1.9 1.9  PHOS 6.7*  --   --  2.6 3.0  --  2.9     Liver Function Tests: Recent Labs  Lab 01/01/21 0242 01/03/21 0803 01/03/21 2053 01/04/21 0435 01/05/21 0500  AST  --  114*  --   --  86*  ALT  --  100*  --   --  79*  ALKPHOS  --  93  --   --  91  BILITOT  --  0.7  --   --  0.6  PROT  --  6.7  --   --  6.1*  ALBUMIN 2.9* 2.7* 2.4* 2.4* 2.4*    No results for input(s): LIPASE, AMYLASE in the last 168 hours. No results for input(s): AMMONIA in the last 168 hours.  CBC: Recent Labs  Lab 01/02/21 0211 01/03/21 0803 01/03/21 1225 01/04/21 0435 01/05/21 0500  WBC 8.1 6.8 6.5 6.2 8.1  HGB 8.3* 8.6* 8.2* 8.3* 8.3*  HCT 25.4* 27.3* 25.9* 26.8* 26.0*  MCV 83.3 84.0 84.4 85.4 85.2  PLT 316 380 366 240 269     Cardiac Enzymes: No results for input(s): CKTOTAL, CKMB, CKMBINDEX, TROPONINI in the last 168 hours.  BNP: Invalid input(s): POCBNP  CBG: Recent Labs  Lab 01/04/21 0614 01/04/21 1142 01/04/21 1532 01/04/21 2033 01/05/21 0525  GLUCAP 151* 188* 170* 114* 119*     Microbiology: Results for orders placed or performed during the hospital encounter of 12/27/20  Resp Panel by RT-PCR (Flu A&B, Covid) Nasopharyngeal Swab     Status: None   Collection Time: 12/27/20  8:53 AM   Specimen: Nasopharyngeal Swab; Nasopharyngeal(NP) swabs in vial transport medium  Result Value Ref Range Status   SARS Coronavirus 2 by RT PCR NEGATIVE NEGATIVE Final    Comment: (NOTE) SARS-CoV-2 target nucleic acids are NOT DETECTED.  The SARS-CoV-2 RNA is generally detectable in upper respiratory specimens during the acute phase of infection. The lowest concentration of SARS-CoV-2 viral copies this assay can detect is 138 copies/mL. A negative result does not preclude SARS-Cov-2 infection and should not be used as the sole basis for treatment or other patient management decisions. A negative result may occur with  improper specimen  collection/handling, submission of specimen other than nasopharyngeal swab, presence of viral mutation(s) within the areas targeted by this assay, and inadequate number of viral copies(<138 copies/mL). A negative result must be combined with clinical observations, patient history, and epidemiological information. The expected result is Negative.  Fact Sheet for Patients:  EntrepreneurPulse.com.au  Fact Sheet for Healthcare Providers:  IncredibleEmployment.be  This test is no t yet approved or cleared by the [place] FDA and  has been authorized for detection and/or diagnosis of SARS-CoV-2 by FDA under an Emergency Use Authorization (EUA). This EUA will remain  in effect (meaning this test can be used) for the duration of the COVID-19 declaration under Section 564(b)(1) of the Act, 21 U.S.C.section 360bbb-3(b)(1), unless the authorization is terminated  or revoked sooner.       Influenza A by PCR NEGATIVE NEGATIVE Final   Influenza B by PCR NEGATIVE NEGATIVE Final    Comment: (NOTE) The Xpert Xpress SARS-CoV-2/FLU/RSV plus assay is intended as an aid in the diagnosis of influenza from Nasopharyngeal swab specimens and should not be used as a sole basis for treatment. Nasal washings and aspirates are unacceptable for Xpert Xpress SARS-CoV-2/FLU/RSV testing.  Fact Sheet for Patients: EntrepreneurPulse.com.au  Fact Sheet for Healthcare Providers: IncredibleEmployment.be  This test is not yet approved or cleared by the [place] FDA and has been authorized for detection and/or diagnosis of SARS-CoV-2 by FDA under an Emergency Use Authorization (EUA). This EUA will remain in effect (meaning this test can be used) for the duration of the COVID-19 declaration under Section 564(b)(1) of the Act, 21 U.S.C. section 360bbb-3(b)(1), unless the authorization is terminated or revoked.  Performed at [facility], [place] [address] [place], [place] [address]     Coagulation Studies: No results for input(s): LABPROT, INR in the last 72 hours.  Urinalysis: No results for input(s): COLORURINE, LABSPEC, PHURINE, GLUCOSEU, HGBUR, BILIRUBINUR, KETONESUR, PROTEINUR, UROBILINOGEN, NITRITE, LEUKOCYTESUR in the last 72 hours.  Invalid input(s): APPERANCEUR    Imaging: No results found.   Medications:    sodium chloride     sodium chloride     sodium chloride     feeding supplement (OSMOLITE 1.5 CAL) Stopped (01/04/21 1118)    amiodarone  200 mg Oral Daily   Chlorhexidine Gluconate Cloth  6 each Topical Daily   Chlorhexidine Gluconate Cloth  6 each Topical Q0600   cilostazol  50 mg Oral BID   ezetimibe  10 mg Oral Daily   famotidine  20 mg Oral Daily  feeding supplement (PROSource TF)  45 mL Per Tube TID   heparin injection (subcutaneous)  5,000 Units Subcutaneous Q8H   hydrALAZINE  25 mg Oral Q8H   insulin aspart  0-6 Units Subcutaneous TID WC   isosorbide mononitrate  30 mg Oral Daily   lidocaine  1 patch Transdermal Q24H   metoprolol succinate  50 mg Oral Daily   multivitamin  1 tablet Oral QHS   pantoprazole  40 mg Oral QHS   rosuvastatin  10 mg Oral Daily   sodium chloride flush  3 mL Intravenous Q12H   sodium chloride, sodium chloride, sodium chloride, acetaminophen, alteplase, fentaNYL, heparin, heparin sodium (porcine), hydrALAZINE, hydrocortisone cream, HYDROmorphone (DILAUDID) injection, lidocaine (PF), lidocaine, lidocaine-prilocaine, loperamide, loratadine, midazolam, Muscle Rub, naLOXone (NARCAN)  injection, pentafluoroprop-tetrafluoroeth, sodium chloride, sodium chloride flush  Assessment/ Plan:  1.AKI on CKD 3-4- multifactorial etiology:hemodynamic insults from HTN crisis, renal artery stenosis, afib w/ RVR worsened, obstructive uropathy. Now s/p left ureteral stent placement but with no improvement. -HD started for uremia & failed lasix challenge. Rt fem temp  line placed with IR on 9/18.  Hemodialysis started 01/01/2021.  Her last dialysis treatment was 01/03/2021.  Her next dialysis treatment will be 01/05/2021.  I would imagine that the dialysis needs again to be ongoing.    2.Hematuria Left obstructive uropathy-  -in the setting of mildly obstructing 6 mm distal left ureter renal stone seen on CT urogram. S/p left ureteral stent on 9/15. ANCA and lupus serologies wnl. Appreciate urology's assistance  3.Intertrochanteric hip fracture- s/p fall in the hospital.  -ortho on board, preop clearance for repair    4.Epistaxis- overnight 9/14, since resolved. Thought to be due to nasal canula. Heparin on hold, continue to monitor. Anca serologies neg  5.Left renal artery stenosis- Hx of 80-90% proximal left renal artery stenosis.  6.Hypertension- BP well controlled on norvasc, metoprolol, and hydralazine.  Ultrafiltration with dialysis blood pressure control improved   7.Hyponatremia-resolved  8.Afib w/ RVR-on amio. Hep gtt on hold given epistaxis, which has resolved.   9.Anemia due to chronic kidney disease-Hgb stable at 8.3   10.Diabetes Mellitus Type 2 with Hyperglycemia- -per primary    LOS: [place] '@TODAY''@7'$ :29 AM

## 2021-01-05 NOTE — Transfer of Care (Signed)
Immediate Anesthesia Transfer of Care Note  Patient: Molly [name]  Procedure(s) Performed: ESOPHAGOGASTRODUODENOSCOPY (EGD) WITH PROPOFOL  Patient Location: PACU  Anesthesia Type:MAC  Level of Consciousness: awake and drowsy  Airway & Oxygen Therapy: Patient Spontanous Breathing and Patient connected to nasal cannula oxygen  Post-op Assessment: Report given to RN and Post -op Vital signs reviewed and stable  Post vital signs: Reviewed and stable  Last Vitals:  Vitals Value Taken Time  BP 123/32 01/05/21 0902  Temp 36.3 C 01/05/21 0902  Pulse 67 01/05/21 0906  Resp 17 01/05/21 0906  SpO2 100 % 01/05/21 0906  Vitals shown include unvalidated device data.  Last Pain:  Vitals:   01/05/21 0902  TempSrc: Temporal  PainSc: 0-No pain      Patients Stated Pain Goal: 1 (87/27/61 8485)  Complications: No notable events documented.

## 2021-01-05 NOTE — Progress Notes (Signed)
Nutrition Follow-up  DOCUMENTATION CODES:   Not applicable  INTERVENTION:   -D/c Osmolite 1.5 and Prosource due to no feeding access -Continue renal MVI daily -Nepro Shake po BID, each supplement provides 425 kcal and 19 grams protein   NUTRITION DIAGNOSIS:   Inadequate oral intake related to inability to eat as evidenced by NPO status.  Progressing; advanced to heart healthy/ carb modified diet on 01/05/21  GOAL:   Patient will meet greater than or equal to 90% of their needs  Progressing   MONITOR:   Diet advancement, Labs, Weight trends, TF tolerance, Skin  REASON FOR ASSESSMENT:   Consult Assessment of nutrition requirement/status  ASSESSMENT:   Molly [name] is a 70 y.o. female with medical history significant of HTN, diastolic CHF, CKD stage IV, PAD, anemia of chronic kidney disease, and diabetes mellitus type 2 presents with complaints of a chest pain for the last 3 to 4 days.  Patient reports having a right substernal chest heaviness.  Noted associated symptoms of shortness of breath, orthopnea, insomnia, weight gain of approximately 5 pounds in the last week, nausea, vomiting, and belching.  Denies any fever, cough, leg swelling, abdominal pain, or dysuria symptoms.  She had tried taking extra dose of furosemide for 2 days without any change in symptoms.  Previously admitted last month for acute on hypercapnic respiratory failure with hypoxia secondary to diastolic heart failure exacerbation.  9/20- advanced to heart healthy, carb modified diet 9/21- s/p EGD- revealed non-obstructing Schatzki ring; hiatal hernia, Hill grade III gastroesophageal flap valve; hiatal hernia; erythematous mucosa in stomach (biopsied)   Reviewed I/O's: +417 ml x 24 hours and +1.1 L since admission   Pt out of room at time of visit. No family at bedside.   Per chart review, pt is much more alert and able to hold a conversation. Consumed 25-90% of PO diet yesterday.   Case discussed with  Dr. Doristine Bosworth; plan to d/c cortrak order.   Medications reviewed.   Labs reviewed: Na: 133, CBGS: 114-119 (inpatient orders for glycemic control are 0-6 units insulin aspart TID with meals).    Diet Order:   Diet Order             Diet NPO time specified  Diet effective ____           Diet NPO time specified  Diet effective midnight                   EDUCATION NEEDS:   Education needs have been addressed  Skin:  Skin Assessment: Skin Integrity Issues: Skin Integrity Issues:: Incisions Incisions: closed perineum  Last BM:  01/04/21  Height:   Ht Readings from Last 1 Encounters:  12/27/20 '5\' 2"'$  (1.575 m)    Weight:   Wt Readings from Last 1 Encounters:  01/05/21 63.1 kg    Ideal Body Weight:  50 kg  BMI:  Body mass index is 25.44 kg/m.  Estimated Nutritional Needs:   Kcal:  [id number]  Protein:  100-115 grams  Fluid:  1000 ml + UOP    Loistine Chance, RD, LDN, [place] Registered Dietitian II Certified Diabetes Care and Education Specialist Please refer to [facility] for RD and/or RD on-call/weekend/after hours pager

## 2021-01-05 NOTE — Progress Notes (Signed)
PROGRESS NOTE    Molly [name]  [id number] DOB: 07-29-1950 DOA: 12/27/2020 PCP: [name], MD   Brief Narrative:  70 year old female with past medical history of hypertension, left renal artery stenosis ([id number]) diastolic CHF, CKD stage IV, CAD, PAD with bilateral SFA CTO's, anemia chronic disease diabetes presented on 12/27/2020 with 4 days of chest pain.  BP was uncontrolled and spiked as high as 234/208 in the ED.  Chest x-ray consistent with pulmonary edema.  Patient briefly required BiPAP.   Admitted to [facility] service with cardiology consulted. Patient developed A. fib with RVR on 9/13.  Assessment & Plan:   Principal Problem:   Acute on chronic respiratory failure with hypoxia and hypercapnia (HCC) Active Problems:   HTN (hypertension)   Type 2 diabetes mellitus with stage 3b chronic kidney disease, with long-term current use of insulin (HCC)   Elevated troponin   Acute on chronic heart failure with preserved ejection fraction (HFpEF) ([place])   CAP (community acquired pneumonia)   Anemia   Abnormal CT of the abdomen  Acute metabolic/uremic encephalopathy: Significantly improved after second dialysis, patient fully alert and oriented and having very reasonable conversation .  Acute respiratory failure with hypoxia secondary to recurrent flash pulmonary edema/acute systolic congestive heart failure: Echo shows reduced ejection fraction to 40 to 45% compared to the previous 1.  Cardiology on board and managing.  Due to rising creatinine, she is on diuretics holiday.  No plans for diuretics per cardiology.  She is improving with dialysis and her oxygen requirement is only 1 L after second session of dialysis as opposed to 4 L that she required before dialysis.  Fall in hospital/left hip fracture: CT left hip indicates left hip fracture.  Orthopedics on board.  Plan for surgical repair once she is more stable.  Potentially on Thursday.  Hypertensive emergency with chest pain:  No chest pain and blood pressure controlled now.  Continue amlodipine 10 mg p.o. daily, hydralazine 25 mg p.o. 3 times daily Imdur to 30 mg p.o. daily, Toprol-XL 50 mg p.o. daily.  Left renal artery stenosis: Has a history of 80 to 90% of proximal left renal artery stenosis.  Plan was to do angiogram and stent but due to rising creatinine, this plan is on hold for now.  Cardiology and nephrology managing.  New onset A. fib with RVR: Developed overnight 12/28/2020.  She was initially started on amiodarone drip and converted back to sinus rhythm. Not on any anticoagulation due to slight drop in hemoglobin.  However hemoglobin has remained stable since 12/30/2020.  Patient also underwent EGD today 01/05/2021, no signs of bleeding.  GI cleared her for anticoagulation.  Will defer to cardiology to initiate anticoagulation.  Possible gastroduodenitis: CT abdomen suggests possible gastroduodenitis.  Status post EGD 01/05/2021, no signs of gastroduodenitis.  Remains on PPI.  Epistaxis: Nosebleed overnight 12/30/2018, no further episodes.  Heparin was stopped and has remained on hold since then.  AKI on CKD stage IV/progressive renal failure/obstructive uropathy/left ureteral stone/left hydronephrosis/dross hematuria: CT renal stone yesterday shows 6 mm of left ureteral stone causing obstruction/hydronephrosis.  Patient underwent cystoscopy with left ureteral stent placement on 12/30/2020.  TDC was placed on 01/01/2021 and she received first session of dialysis and second on 01/03/2021.  Per nephrology, next dialysis today and they suspect permanent dialysis now.  Hyponatremia: Improving.  133 today.  Community-acquired pneumonia: Completed 5 days of Rocephin on 12/31/2020.  Elevated troponin/demand ischemia: In the setting of hypertensive crisis.  EKG without ischemic changes.  Likely demand ischemia. 397 > 376.  Cardiology on board.  No plan for intervention.  Type 2 diabetes mellitus: Recent hemoglobin A1c 8.2%  on 11/27/2020.  Home regimen includes Humulin 70/30 mix 8 to 12 units daily.  Due to her n.p.o. status and hypoglycemia, 70/30 was discontinued.  She is on SSI and blood sugar controlled.  Watch closely.  Dyslipidemia: Continue Zetia and Crestor.  PAD: Continue statin and Pletal.  Anemia of chronic disease: Hemoglobin has remained stable since 12/30/2020.  CT renal stone indicates possible gastritis or gastric ulcer.  GI on board.  GERD: Continue PPI.  Moderate protein calorie malnutrition: Seen by nutrition.  Remains on Prosource 3 times daily.  Abdominal tenderness: Resolved.  DVT prophylaxis: heparin injection 5,000 Units Start: 01/03/21 1400   Code Status: Full Code  Family Communication: Patient's husband present at bedside.  Plan of care discussed with him in length.    Status is: Inpatient  Remains inpatient appropriate because:Ongoing diagnostic testing needed not appropriate for outpatient work up  Dispo: The patient is from: Home              Anticipated d/c is to: Home              Patient currently is not medically stable to d/c.   Difficult to place patient No        Estimated body mass index is 26.77 kg/m as calculated from the following:   Height as of this encounter: '5\' 2"'$  (1.575 m).   Weight as of this encounter: 66.4 kg.     Nutritional Assessment: Body mass index is 26.77 kg/m.Marland Kitchen Seen by dietician.  I agree with the assessment and plan as outlined below: Nutrition Status: Nutrition Problem: Inadequate oral intake Etiology: inability to eat Signs/Symptoms: NPO status Interventions: Tube feeding Skin Assessment: I have examined the patient's skin and I agree with the wound assessment as performed by the wound care RN as outlined below:    Consultants:  Nephrology Cardiology Orthopedic Urology-signed off GI  Procedures:  As above  Antimicrobials:  Anti-infectives (From admission, onward)    Start     Dose/Rate Route Frequency Ordered Stop    12/31/20 1330  cefTRIAXone (ROCEPHIN) 1 g in sodium chloride 0.9 % 100 mL IVPB        1 g 200 mL/hr over 30 Minutes Intravenous  Once 12/31/20 1315 12/31/20 1350   12/31/20 1000  cefTRIAXone (ROCEPHIN) 1 g in sodium chloride 0.9 % 100 mL IVPB  Status:  Discontinued        1 g 200 mL/hr over 30 Minutes Intravenous Every 24 hours 12/30/20 1202 12/31/20 1315   12/27/20 1530  cefTRIAXone (ROCEPHIN) 2 g in sodium chloride 0.9 % 100 mL IVPB  Status:  Discontinued        2 g 200 mL/hr over 30 Minutes Intravenous Every 24 hours 12/27/20 1435 12/30/20 1202          Subjective: Seen and examined.  She just returned from EGD.  Has been at the bedside.  She is fully alert and oriented.  Has no complaints.  Objective: Vitals:   01/05/21 0742 01/05/21 0902 01/05/21 0912 01/05/21 0922  BP: (!) 165/45 (!) 123/32 (!) 155/42 (!) 159/46  Pulse: 70 68 69 69  Resp: (!) '21 18 16 16  '$ Temp: 98.5 F (36.9 C) (!) 97.4 F (36.3 C)    TempSrc: Oral Temporal    SpO2: 100% 100% 100% 100%  Weight:  Height:        Intake/Output Summary (Last 24 hours) at 01/05/2021 0958 Last data filed at 01/05/2021 0855 Gross per 24 hour  Intake 262 ml  Output 0 ml  Net 262 ml    Filed Weights   01/03/21 1630 01/04/21 0428 01/05/21 0353  Weight: 67.8 kg 67.2 kg 66.4 kg    Examination: General exam: Appears calm and comfortable  Respiratory system: Slightly diminished breath sounds at the bases bilaterally, poor inspiratory effort.. Cardiovascular system: S1 & S2 heard, RRR. No JVD, murmurs, rubs, gallops or clicks. No pedal edema. Gastrointestinal system: Abdomen is nondistended, soft and nontender. No organomegaly or masses felt. Normal bowel sounds heard. Central nervous system: Alert and oriented. No focal neurological deficits. Extremities: Symmetric 5 x 5 power. Skin: No rashes, lesions or ulcers.  Psychiatry: Judgement and insight appear normal. Mood & affect appropriate.    Data Reviewed: I have  personally reviewed following labs and imaging studies  CBC: Recent Labs  Lab 01/02/21 0211 01/03/21 0803 01/03/21 1225 01/04/21 0435 01/05/21 0500  WBC 8.1 6.8 6.5 6.2 8.1  HGB 8.3* 8.6* 8.2* 8.3* 8.3*  HCT 25.4* 27.3* 25.9* 26.8* 26.0*  MCV 83.3 84.0 84.4 85.4 85.2  PLT 316 380 366 240 [id number]    Basic Metabolic Panel: Recent Labs  Lab 01/01/21 0242 01/03/21 0803 01/03/21 1815 01/03/21 2053 01/04/21 0435 01/04/21 1732 01/05/21 0500  NA 125* 134*  --  135 133*  --  133*  K 4.8 3.6  --  3.6 3.6  --  3.6  CL 84* 93*  --  98 96*  --  96*  CO2 20* 24  --  24 25  --  26  GLUCOSE 203* 110*  --  131* 146*  --  122*  BUN 76* 48*  --  26* 30*  --  34*  CREATININE 6.50* 4.89*  --  3.21* 3.39*  --  4.01*  CALCIUM 7.5* 8.6*  --  8.0* 8.0*  --  8.2*  MG  --  2.0 1.9  --  1.9 1.9 1.9  PHOS 6.7*  --   --  2.6 3.0  --  2.9    GFR: Estimated Creatinine Clearance: 12 mL/min (A) (by C-G formula based on SCr of 4.01 mg/dL (H)). Liver Function Tests: Recent Labs  Lab 01/01/21 0242 01/03/21 0803 01/03/21 2053 01/04/21 0435 01/05/21 0500  AST  --  114*  --   --  86*  ALT  --  100*  --   --  79*  ALKPHOS  --  93  --   --  91  BILITOT  --  0.7  --   --  0.6  PROT  --  6.7  --   --  6.1*  ALBUMIN 2.9* 2.7* 2.4* 2.4* 2.4*    No results for input(s): LIPASE, AMYLASE in the last 168 hours. No results for input(s): AMMONIA in the last 168 hours. Coagulation Profile: No results for input(s): INR, PROTIME in the last 168 hours. Cardiac Enzymes: No results for input(s): CKTOTAL, CKMB, CKMBINDEX, TROPONINI in the last 168 hours. BNP (last 3 results) No results for input(s): PROBNP in the last 8760 hours. HbA1C: No results for input(s): HGBA1C in the last 72 hours. CBG: Recent Labs  Lab 01/04/21 0614 01/04/21 1142 01/04/21 1532 01/04/21 2033 01/05/21 0525  GLUCAP 151* 188* 170* 114* 119*    Lipid Profile: No results for input(s): CHOL, HDL, LDLCALC, TRIG, CHOLHDL, LDLDIRECT  in the last 72  hours. Thyroid Function Tests: No results for input(s): TSH, T4TOTAL, FREET4, T3FREE, THYROIDAB in the last 72 hours. Anemia Panel: No results for input(s): VITAMINB12, FOLATE, FERRITIN, TIBC, IRON, RETICCTPCT in the last 72 hours. Sepsis Labs: No results for input(s): PROCALCITON, LATICACIDVEN in the last 168 hours.   Recent Results (from the past 240 hour(s))  Resp Panel by RT-PCR (Flu A&B, Covid) Nasopharyngeal Swab     Status: None   Collection Time: 12/27/20  8:53 AM   Specimen: Nasopharyngeal Swab; Nasopharyngeal(NP) swabs in vial transport medium  Result Value Ref Range Status   SARS Coronavirus 2 by RT PCR NEGATIVE NEGATIVE Final    Comment: (NOTE) SARS-CoV-2 target nucleic acids are NOT DETECTED.  The SARS-CoV-2 RNA is generally detectable in upper respiratory specimens during the acute phase of infection. The lowest concentration of SARS-CoV-2 viral copies this assay can detect is 138 copies/mL. A negative result does not preclude SARS-Cov-2 infection and should not be used as the sole basis for treatment or other patient management decisions. A negative result may occur with  improper specimen collection/handling, submission of specimen other than nasopharyngeal swab, presence of viral mutation(s) within the areas targeted by this assay, and inadequate number of viral copies(<138 copies/mL). A negative result must be combined with clinical observations, patient history, and epidemiological information. The expected result is Negative.  Fact Sheet for Patients:  EntrepreneurPulse.com.au  Fact Sheet for Healthcare Providers:  IncredibleEmployment.be  This test is no t yet approved or cleared by the [place] FDA and  has been authorized for detection and/or diagnosis of SARS-CoV-2 by FDA under an Emergency Use Authorization (EUA). This EUA will remain  in effect (meaning this test can be used) for the duration of  the COVID-19 declaration under Section 564(b)(1) of the Act, 21 U.S.C.section 360bbb-3(b)(1), unless the authorization is terminated  or revoked sooner.       Influenza A by PCR NEGATIVE NEGATIVE Final   Influenza B by PCR NEGATIVE NEGATIVE Final    Comment: (NOTE) The Xpert Xpress SARS-CoV-2/FLU/RSV plus assay is intended as an aid in the diagnosis of influenza from Nasopharyngeal swab specimens and should not be used as a sole basis for treatment. Nasal washings and aspirates are unacceptable for Xpert Xpress SARS-CoV-2/FLU/RSV testing.  Fact Sheet for Patients: EntrepreneurPulse.com.au  Fact Sheet for Healthcare Providers: IncredibleEmployment.be  This test is not yet approved or cleared by the [place] FDA and has been authorized for detection and/or diagnosis of SARS-CoV-2 by FDA under an Emergency Use Authorization (EUA). This EUA will remain in effect (meaning this test can be used) for the duration of the COVID-19 declaration under Section 564(b)(1) of the Act, 21 U.S.C. section 360bbb-3(b)(1), unless the authorization is terminated or revoked.  Performed at [facility], [address] [place], [place] [address]        Radiology Studies: No results found.  Scheduled Meds:  amiodarone  200 mg Oral Daily   Chlorhexidine Gluconate Cloth  6 each Topical Daily   Chlorhexidine Gluconate Cloth  6 each Topical Q0600   cilostazol  50 mg Oral BID   ezetimibe  10 mg Oral Daily   famotidine  20 mg Oral Daily   feeding supplement (PROSource TF)  45 mL Per Tube TID   heparin injection (subcutaneous)  5,000 Units Subcutaneous Q8H   hydrALAZINE  25 mg Oral Q8H   insulin aspart  0-6 Units Subcutaneous TID WC   isosorbide mononitrate  30 mg Oral Daily   lidocaine  1 patch Transdermal Q24H   metoprolol succinate  50 mg Oral Daily   multivitamin  1 tablet Oral QHS   pantoprazole  40 mg Oral QHS   rosuvastatin  10 mg Oral  Daily   sodium chloride flush  3 mL Intravenous Q12H   Continuous Infusions:  sodium chloride     sodium chloride     sodium chloride     feeding supplement (OSMOLITE 1.5 CAL) Stopped (01/04/21 1118)     LOS: 9 days   Time spent: 30 minutes   Darliss Cheney, MD [facility]  01/05/2021, 9:58 AM  Please page via [place] and do not message via secure chat for anything urgent. Secure chat can be used for anything non urgent and I will respond at my earliest availability.  How to contact the [facility] Attending or Consulting provider [place] or covering provider during after hours [place], for this patient?  Check the care team in [facility] and look for a) attending/consulting [facility] provider listed and b) the [facility] team listed. Page or secure chat [phone]. Log into [email or web address] and use [place]'s universal password to access. If you do not have the password, please contact the hospital operator. Locate the [facility] provider you are looking for under [facility] and page to a number that you can be directly reached. If you still have difficulty reaching the provider, please page the [facility] (Director on Call) for the Hospitalists listed on amion for assistance.

## 2021-01-05 NOTE — Op Note (Signed)
[facility] Patient Name: Molly [name] Procedure Date : 01/05/2021 MRN: [id number] Attending MD: Gatha Mayer , MD Date of Birth: December 24, 1950 CSN: [id number] Age: 70 Admit Type: Inpatient Procedure:                Upper GI endoscopy Indications:              Abnormal CT of the GI tract, Anemia Providers:                Gatha Mayer, MD, Mariana Arn, Tyna Jaksch                            Technician Referring MD:              Medicines:                Propofol per Anesthesia, Monitored Anesthesia Care Complications:            No immediate complications. Estimated Blood Loss:     Estimated blood loss was minimal. Procedure:                Pre-Anesthesia Assessment:                           - Prior to the procedure, a History and Physical                            was performed, and patient medications and                            allergies were reviewed. The patient's tolerance of                            previous anesthesia was also reviewed. The risks                            and benefits of the procedure and the sedation                            options and risks were discussed with the patient.                            All questions were answered, and informed consent                            was obtained. Prior Anticoagulants: The patient has                            taken no previous anticoagulant or antiplatelet                            agents. ASA Grade Assessment: III - A patient with                            severe systemic disease. After reviewing the risks  and benefits, the patient was deemed in                            satisfactory condition to undergo the procedure.                           After obtaining informed consent, the endoscope was                            passed under direct vision. Throughout the                            procedure, the patient's blood pressure, pulse, and                             oxygen saturations were monitored continuously. The                            [id number] Olympus endoscope was introduced                            through the mouth, and advanced to the second part                            of duodenum. The upper GI endoscopy was                            accomplished without difficulty. The patient                            tolerated the procedure well. Scope In: Scope Out: Findings:      A non-obstructing Schatzki ring was found at the gastroesophageal       junction.      The gastroesophageal flap valve was visualized endoscopically and       classified as Hill Grade III (minimal fold, loose to endoscope, hiatal       hernia likely).      A 1 cm hiatal hernia was present.      Patchy mildly erythematous mucosa without bleeding was found in the       entire examined stomach. Biopsies were taken with a cold forceps for       histology. Verification of patient identification for the specimen was       done. Estimated blood loss was minimal.      The examined duodenum was normal.      The cardia and gastric fundus were otherwise normal on retroflexion.       This photo failed to capture Impression:               - Non-obstructing Schatzki ring.                           - Gastroesophageal flap valve classified as Hill                            Grade III (minimal fold, loose to endoscope, hiatal  hernia likely).                           - 1 cm hiatal hernia.                           - Erythematous mucosa in the stomach. Biopsied.                           - Normal examined duodenum. Recommendation:           - Patient has a contact number available for                            emergencies. The signs and symptoms of potential                            delayed complications were discussed with the                            patient. Return to normal activities tomorrow.                             Written discharge instructions were provided to the                            patient.                           - Resume previous diet.                           - Continue present medications.                           - Await pathology results.                           - GI is signing off                           Does not need GI appointment at dc                           OK to anticoagulate                           I will f/u pathology and notify patient                           I have called and updated husband Procedure Code(s):        --- Professional ---                           [phone], Esophagogastroduodenoscopy, flexible,                            transoral; with biopsy, single or multiple  Diagnosis Code(s):        --- Professional ---                           K22.2, Esophageal obstruction                           K44.9, Diaphragmatic hernia without obstruction or                            gangrene                           K31.89, Other diseases of stomach and duodenum                           D64.9, Anemia, unspecified                           R93.3, Abnormal findings on diagnostic imaging of                            other parts of digestive tract CPT copyright 2019 American Medical Association. All rights reserved. The codes documented in this report are preliminary and upon coder review may  be revised to meet current compliance requirements. Gatha Mayer, MD 01/05/2021 9:05:33 AM This report has been signed electronically. Number of Addenda: 0

## 2021-01-05 NOTE — Anesthesia Postprocedure Evaluation (Signed)
Anesthesia Post Note  Patient: Molly [name]  Procedure(s) Performed: ESOPHAGOGASTRODUODENOSCOPY (EGD) WITH PROPOFOL     Patient location during evaluation: Endoscopy Anesthesia Type: MAC Level of consciousness: awake and alert Pain management: pain level controlled Vital Signs Assessment: post-procedure vital signs reviewed and stable Respiratory status: spontaneous breathing, nonlabored ventilation, respiratory function stable and patient connected to nasal cannula oxygen Cardiovascular status: stable and blood pressure returned to baseline Postop Assessment: no apparent nausea or vomiting Anesthetic complications: no   No notable events documented.  Last Vitals:  Vitals:   01/05/21 0912 01/05/21 0922  BP: (!) 155/42 (!) 159/46  Pulse: 69 69  Resp: 16 16  Temp:    SpO2: 100% 100%    Last Pain:  Vitals:   01/05/21 0922  TempSrc:   PainSc: 0-No pain                 March Rummage Renelle Stegenga

## 2021-01-05 NOTE — Plan of Care (Signed)
Patient progressing 

## 2021-01-05 NOTE — Interval H&P Note (Signed)
History and Physical Interval Note:  01/05/2021 8:35 AM  Molly [name]  has presented today for surgery, with the diagnosis of Anemia.  FOBT status yet to be determined..  The various methods of treatment have been discussed with the patient and family. After consideration of risks, benefits and other options for treatment, the patient has consented to  Procedure(s): ESOPHAGOGASTRODUODENOSCOPY (EGD) WITH PROPOFOL (N/A) as a surgical intervention.  The patient's history has been reviewed, patient examined, no change in status, stable for surgery.  I have reviewed the patient's chart and labs.  Questions were answered to the patient's satisfaction.     Silvano Rusk

## 2021-01-06 ENCOUNTER — Encounter (HOSPITAL_COMMUNITY)
Admission: EM | Disposition: A | Discharge status: Home Health | Payer: Self-pay | Source: Home / Self Care | Attending: Family Medicine

## 2021-01-06 ENCOUNTER — Inpatient Hospital Stay (HOSPITAL_COMMUNITY): Payer: Medicare Other

## 2021-01-06 ENCOUNTER — Encounter (HOSPITAL_COMMUNITY): Payer: Self-pay | Admitting: Internal Medicine

## 2021-01-06 ENCOUNTER — Encounter (HOSPITAL_COMMUNITY): Payer: Medicare Other

## 2021-01-06 ENCOUNTER — Inpatient Hospital Stay (HOSPITAL_COMMUNITY): Payer: Medicare Other | Admitting: Certified Registered"

## 2021-01-06 DIAGNOSIS — I132 Hypertensive heart and chronic kidney disease with heart failure and with stage 5 chronic kidney disease, or end stage renal disease: Secondary | ICD-10-CM | POA: Diagnosis not present

## 2021-01-06 DIAGNOSIS — J9621 Acute and chronic respiratory failure with hypoxia: Secondary | ICD-10-CM | POA: Diagnosis not present

## 2021-01-06 DIAGNOSIS — J9622 Acute and chronic respiratory failure with hypercapnia: Secondary | ICD-10-CM | POA: Diagnosis not present

## 2021-01-06 HISTORY — PX: INTRAMEDULLARY (IM) NAIL INTERTROCHANTERIC: SHX5875

## 2021-01-06 LAB — CBC
HCT: 26.2 % — ABNORMAL LOW (ref 36.0–46.0)
Hemoglobin: 8.5 g/dL — ABNORMAL LOW (ref 12.0–15.0)
MCH: 27.5 pg (ref 26.0–34.0)
MCHC: 32.4 g/dL (ref 30.0–36.0)
MCV: 84.8 fL (ref 80.0–100.0)
Platelets: 223 10*3/uL (ref 150–400)
RBC: 3.09 MIL/uL — ABNORMAL LOW (ref 3.87–5.11)
RDW: 16.3 % — ABNORMAL HIGH (ref 11.5–15.5)
WBC: 7.9 10*3/uL (ref 4.0–10.5)
nRBC: 0 % (ref 0.0–0.2)

## 2021-01-06 LAB — RENAL FUNCTION PANEL
Albumin: 2.3 g/dL — ABNORMAL LOW (ref 3.5–5.0)
Anion gap: 8 (ref 5–15)
BUN: 18 mg/dL (ref 8–23)
CO2: 28 mmol/L (ref 22–32)
Calcium: 8 mg/dL — ABNORMAL LOW (ref 8.9–10.3)
Chloride: 98 mmol/L (ref 98–111)
Creatinine, Ser: 2.9 mg/dL — ABNORMAL HIGH (ref 0.44–1.00)
GFR, Estimated: 17 mL/min — ABNORMAL LOW (ref >60)
Glucose, Bld: 121 mg/dL — ABNORMAL HIGH (ref 70–99)
Phosphorus: 2.9 mg/dL (ref 2.5–4.6)
Potassium: 3.6 mmol/L (ref 3.5–5.1)
Sodium: 134 mmol/L — ABNORMAL LOW (ref 135–145)

## 2021-01-06 LAB — GLUCOSE, CAPILLARY
Glucose-Capillary: 116 mg/dL — ABNORMAL HIGH (ref 70–99)
Glucose-Capillary: 114 mg/dL — ABNORMAL HIGH (ref 70–99)
Glucose-Capillary: 113 mg/dL — ABNORMAL HIGH (ref 70–99)
Glucose-Capillary: 121 mg/dL — ABNORMAL HIGH (ref 70–99)
Glucose-Capillary: 124 mg/dL — ABNORMAL HIGH (ref 70–99)

## 2021-01-06 LAB — SURGICAL PCR SCREEN
MRSA, PCR: NEGATIVE
Staphylococcus aureus: NEGATIVE

## 2021-01-06 LAB — SURGICAL PATHOLOGY

## 2021-01-06 SURGERY — FIXATION, FRACTURE, INTERTROCHANTERIC, WITH INTRAMEDULLARY ROD
Anesthesia: Spinal | Site: Hip | Laterality: Left

## 2021-01-06 MED ORDER — PHENYLEPHRINE 40 MCG/ML (10ML) SYRINGE FOR IV PUSH (FOR BLOOD PRESSURE SUPPORT)
PREFILLED_SYRINGE | INTRAVENOUS | Status: DC | PRN
  Administered 2021-01-06 (×2): 80 ug via INTRAVENOUS
  Administered 2021-01-06: 40 ug via INTRAVENOUS
  Administered 2021-01-06: 80 ug via INTRAVENOUS
  Administered 2021-01-06: 120 ug via INTRAVENOUS

## 2021-01-06 MED ORDER — TRANEXAMIC ACID-NACL 1000-0.7 MG/100ML-% IV SOLN
1000.0000 mg | Freq: Once | INTRAVENOUS | Status: AC
  Administered 2021-01-06: 1000 mg via INTRAVENOUS
  Filled 2021-01-06: qty 100

## 2021-01-06 MED ORDER — CEFAZOLIN SODIUM-DEXTROSE 2-4 GM/100ML-% IV SOLN
2.0000 g | Freq: Two times a day (BID) | INTRAVENOUS | Status: AC
  Administered 2021-01-07: 2 g via INTRAVENOUS
  Filled 2021-01-06: qty 100

## 2021-01-06 MED ORDER — SODIUM CHLORIDE 0.9 % IV SOLN: INTRAVENOUS | Status: DC

## 2021-01-06 MED ORDER — ONDANSETRON HCL 4 MG/2ML IJ SOLN
INTRAMUSCULAR | Status: DC | PRN
  Administered 2021-01-06: 4 mg via INTRAVENOUS

## 2021-01-06 MED ORDER — BUPIVACAINE IN DEXTROSE 0.75-8.25 % IT SOLN
INTRATHECAL | Status: DC | PRN
  Administered 2021-01-06: 1.6 mL via INTRATHECAL

## 2021-01-06 MED ORDER — FENTANYL CITRATE (PF) 250 MCG/5ML IJ SOLN
INTRAMUSCULAR | Status: AC
  Filled 2021-01-06: qty 5

## 2021-01-06 MED ORDER — FENTANYL CITRATE (PF) 250 MCG/5ML IJ SOLN
INTRAMUSCULAR | Status: DC | PRN
  Administered 2021-01-06 (×2): 25 ug via INTRAVENOUS

## 2021-01-06 MED ORDER — OXYCODONE HCL 5 MG PO TABS: 5.0000 mg | ORAL_TABLET | Freq: Once | ORAL | Status: DC | PRN

## 2021-01-06 MED ORDER — LACTATED RINGERS IV SOLN: INTRAVENOUS | Status: DC

## 2021-01-06 MED ORDER — MIDAZOLAM HCL 2 MG/2ML IJ SOLN
INTRAMUSCULAR | Status: AC
  Filled 2021-01-06: qty 2

## 2021-01-06 MED ORDER — PROPOFOL 500 MG/50ML IV EMUL
INTRAVENOUS | Status: DC | PRN
  Administered 2021-01-06: 50 ug/kg/min via INTRAVENOUS

## 2021-01-06 MED ORDER — CEFAZOLIN SODIUM-DEXTROSE 2-4 GM/100ML-% IV SOLN
2.0000 g | Freq: Four times a day (QID) | INTRAVENOUS | Status: DC
  Filled 2021-01-06 (×2): qty 100

## 2021-01-06 MED ORDER — OXYCODONE HCL 5 MG/5ML PO SOLN
5.0000 mg | Freq: Once | ORAL | Status: DC | PRN
Start: 2021-01-06 — End: 2021-01-06

## 2021-01-06 MED ORDER — LIDOCAINE HCL (PF) 2 % IJ SOLN
INTRAMUSCULAR | Status: AC
  Filled 2021-01-06: qty 5

## 2021-01-06 MED ORDER — 0.9 % SODIUM CHLORIDE (POUR BTL) OPTIME
TOPICAL | Status: DC | PRN
  Administered 2021-01-06: 1000 mL

## 2021-01-06 MED ORDER — CHLORHEXIDINE GLUCONATE 0.12 % MT SOLN: 15.0000 mL | Freq: Once | OROMUCOSAL | Status: AC

## 2021-01-06 MED ORDER — PROPOFOL 10 MG/ML IV BOLUS
INTRAVENOUS | Status: DC | PRN
Start: 2021-01-06 — End: 2021-01-06
  Administered 2021-01-06: 20 mg via INTRAVENOUS

## 2021-01-06 MED ORDER — MIDAZOLAM HCL 2 MG/2ML IJ SOLN
INTRAMUSCULAR | Status: DC | PRN
Start: 2021-01-06 — End: 2021-01-06
  Administered 2021-01-06: 1 mg via INTRAVENOUS

## 2021-01-06 MED ORDER — PHENOL 1.4 % MT LIQD: 1.0000 | OROMUCOSAL | Status: DC | PRN

## 2021-01-06 MED ORDER — FENTANYL CITRATE (PF) 100 MCG/2ML IJ SOLN: 25.0000 ug | INTRAMUSCULAR | Status: DC | PRN

## 2021-01-06 MED ORDER — ORAL CARE MOUTH RINSE: 15.0000 mL | Freq: Once | OROMUCOSAL | Status: AC

## 2021-01-06 MED ORDER — DOCUSATE SODIUM 100 MG PO CAPS
100.0000 mg | ORAL_CAPSULE | Freq: Two times a day (BID) | ORAL | Status: DC
Start: 2021-01-06 — End: 2021-01-11
  Administered 2021-01-06 – 2021-01-10 (×8): 100 mg via ORAL
  Filled 2021-01-06 (×8): qty 1

## 2021-01-06 MED ORDER — PROPOFOL 10 MG/ML IV BOLUS
INTRAVENOUS | Status: AC
  Filled 2021-01-06: qty 20

## 2021-01-06 MED ORDER — METOCLOPRAMIDE HCL 5 MG PO TABS: 5.0000 mg | ORAL_TABLET | Freq: Three times a day (TID) | ORAL | Status: DC | PRN

## 2021-01-06 MED ORDER — CHLORHEXIDINE GLUCONATE 0.12 % MT SOLN
OROMUCOSAL | Status: AC
  Administered 2021-01-06: 15 mL via OROMUCOSAL
  Filled 2021-01-06: qty 15

## 2021-01-06 MED ORDER — MENTHOL 3 MG MT LOZG: 1.0000 | LOZENGE | OROMUCOSAL | Status: DC | PRN

## 2021-01-06 MED ORDER — CHLORHEXIDINE GLUCONATE CLOTH 2 % EX PADS: 6.0000 | MEDICATED_PAD | Freq: Every day | CUTANEOUS | Status: DC

## 2021-01-06 MED ORDER — ROCURONIUM BROMIDE 10 MG/ML (PF) SYRINGE
PREFILLED_SYRINGE | INTRAVENOUS | Status: AC
  Filled 2021-01-06: qty 10

## 2021-01-06 MED ORDER — METOCLOPRAMIDE HCL 5 MG/ML IJ SOLN
5.0000 mg | Freq: Three times a day (TID) | INTRAMUSCULAR | Status: DC | PRN
  Administered 2021-01-15: 10 mg via INTRAVENOUS
  Filled 2021-01-06: qty 2

## 2021-01-06 SURGICAL SUPPLY — 51 items
BAG COUNTER SPONGE SURGICOUNT (BAG) ×2 IMPLANT
BIT DRILL CANN LG 4.3MM (BIT) ×1 IMPLANT
BLADE SURG 10 STRL SS (BLADE) ×4 IMPLANT
BNDG COHESIVE 4X5 TAN STRL (GAUZE/BANDAGES/DRESSINGS) IMPLANT
BNDG COHESIVE 6X5 TAN STRL LF (GAUZE/BANDAGES/DRESSINGS) ×4 IMPLANT
BRUSH SCRUB EZ PLAIN DRY (MISCELLANEOUS) IMPLANT
CHLORAPREP W/TINT 26 (MISCELLANEOUS) ×2 IMPLANT
COVER MAYO STAND STRL (DRAPES) ×2 IMPLANT
COVER PERINEAL POST (MISCELLANEOUS) ×2 IMPLANT
COVER SURGICAL LIGHT HANDLE (MISCELLANEOUS) ×2 IMPLANT
DRAPE C-ARM 35X43 STRL (DRAPES) ×2 IMPLANT
DRAPE C-ARMOR (DRAPES) ×2 IMPLANT
DRAPE HALF SHEET 40X57 (DRAPES) ×4 IMPLANT
DRAPE IMP U-DRAPE 54X76 (DRAPES) ×4 IMPLANT
DRAPE INCISE IOBAN 66X45 STRL (DRAPES) ×2 IMPLANT
DRAPE STERI IOBAN 125X83 (DRAPES) ×2 IMPLANT
DRAPE SURG 17X23 STRL (DRAPES) ×2 IMPLANT
DRAPE U-SHAPE 47X51 STRL (DRAPES) ×2 IMPLANT
DRESSING MEPILEX FLEX 4X4 (GAUZE/BANDAGES/DRESSINGS) ×2 IMPLANT
DRILL BIT CANN LG 4.3MM (BIT) ×2
DRSG MEPILEX BORDER 4X4 (GAUZE/BANDAGES/DRESSINGS) ×4 IMPLANT
DRSG MEPILEX BORDER 4X8 (GAUZE/BANDAGES/DRESSINGS) IMPLANT
DRSG MEPILEX FLEX 4X4 (GAUZE/BANDAGES/DRESSINGS) ×4
ELECT REM PT RETURN 9FT ADLT (ELECTROSURGICAL) ×2
ELECTRODE REM PT RTRN 9FT ADLT (ELECTROSURGICAL) ×1 IMPLANT
GLOVE SRG 8 PF TXTR STRL LF DI (GLOVE) ×1 IMPLANT
GLOVE SURG ENC TEXT LTX SZ7.5 (GLOVE) ×2 IMPLANT
GLOVE SURG UNDER POLY LF SZ8 (GLOVE) ×2
GOWN STRL REUS W/ TWL LRG LVL3 (GOWN DISPOSABLE) ×1 IMPLANT
GOWN STRL REUS W/ TWL XL LVL3 (GOWN DISPOSABLE) ×1 IMPLANT
GOWN STRL REUS W/TWL LRG LVL3 (GOWN DISPOSABLE) ×2
GOWN STRL REUS W/TWL XL LVL3 (GOWN DISPOSABLE) ×2
GUIDEPIN VERSANAIL DSP 3.2X444 (ORTHOPEDIC DISPOSABLE SUPPLIES) ×4 IMPLANT
HIP FRA NAIL LAG SCREW 10.5X90 (Orthopedic Implant) ×2 IMPLANT
KIT BASIN OR (CUSTOM PROCEDURE TRAY) ×2 IMPLANT
KIT TURNOVER KIT B (KITS) ×2 IMPLANT
MANIFOLD NEPTUNE II (INSTRUMENTS) ×2 IMPLANT
NAIL HIP FRACT 130D 9X180 (Orthopedic Implant) ×2 IMPLANT
NS IRRIG 1000ML POUR BTL (IV SOLUTION) ×2 IMPLANT
PACK GENERAL/GYN (CUSTOM PROCEDURE TRAY) ×2 IMPLANT
PAD ARMBOARD 7.5X6 YLW CONV (MISCELLANEOUS) ×4 IMPLANT
SCREW BONE CORTICAL 5.0X3 (Screw) ×2 IMPLANT
SCREW LAG HIP FRA NAIL 10.5X90 (Orthopedic Implant) ×1 IMPLANT
STAPLER VISISTAT 35W (STAPLE) ×2 IMPLANT
SUT MON AB 3-0 SH 27 (SUTURE) ×2
SUT MON AB 3-0 SH27 (SUTURE) ×1 IMPLANT
SUT PDS AB 2-0 CT1 27 (SUTURE) ×2 IMPLANT
TOWEL GREEN STERILE (TOWEL DISPOSABLE) ×4 IMPLANT
TOWEL GREEN STERILE FF (TOWEL DISPOSABLE) ×2 IMPLANT
UNDERPAD 30X36 HEAVY ABSORB (UNDERPADS AND DIAPERS) ×2 IMPLANT
WATER STERILE IRR 1000ML POUR (IV SOLUTION) IMPLANT

## 2021-01-06 NOTE — Progress Notes (Addendum)
[facility] ROUNDING NOTE   Subjective:   Interval History: 70 year old lady with history of hypertension left renal artery stenosis [id number] diastolic dysfunction chronic kidney disease stage IV peripheral artery disease bilateral  superficial femoral artery obstruction.  Anemia, diabetes.  Presented 12/27/2020 with chest pain.  Was also found to have uncontrolled hypertension.  Blood pressure 1 6556 pulse 70 temperature 98.6 O2 sats 98% room air.  Last dialysis 9/21 with 2 L  next dialysis will be 01/07/2021.  Will need conversion to permanent dialysis access.  We will consult vein vascular surgery.  Sodium 134 potassium 3.6 chloride 98 CO2 28 BUN 18 creatinine 2.9 glucose 121 calcium 8 phosphorus 2.9 albumin 2.3 hemoglobin 8.5   minimal urine output  Objective:  Vital signs in last 24 hours:  Temp:  [97.7 F (36.5 C)-98.8 F (37.1 C)] 98.8 F (37.1 C) (09/22 0749) Pulse Rate:  [64-78] 64 (09/22 0749) Resp:  [16-23] 17 (09/22 0359) BP: (131-153)/(46-80) 141/51 (09/22 0749) SpO2:  [95 %-100 %] 99 % (09/22 0749) Weight:  [63.1 kg-66.4 kg] 64.1 kg (09/22 0359)  Weight change: 0 kg Filed Weights   01/05/21 1003 01/05/21 1316 01/06/21 0359  Weight: 66.4 kg 63.1 kg 64.1 kg    Intake/Output: I/O last 3 completed shifts: In: 25 [I.V.:25] Out: 2000 [Other:2000]   Intake/Output this shift:  No intake/output data recorded.  Gen: in distress, yelling, laying flat in bed CVS:RRR, no murmurs Resp:crackles bibasilar, normal WOB [id number], nontender Ext:no edema Neuro: not orientable, yelling, moving all ext spontaneously   Basic Metabolic Panel: Recent Labs  Lab 01/01/21 0242 01/03/21 0803 01/03/21 1815 01/03/21 2053 01/04/21 0435 01/04/21 1732 01/05/21 0500 01/06/21 0335  NA 125* 134*  --  135 133*  --  133* 134*  K 4.8 3.6  --  3.6 3.6  --  3.6 3.6  CL 84* 93*  --  98 96*  --  96* 98  CO2 20* 24  --  24 25  --  26 28  GLUCOSE 203* 110*  --  131* 146*  --   122* 121*  BUN 76* 48*  --  26* 30*  --  34* 18  CREATININE 6.50* 4.89*  --  3.21* 3.39*  --  4.01* 2.90*  CALCIUM 7.5* 8.6*  --  8.0* 8.0*  --  8.2* 8.0*  MG  --  2.0 1.9  --  1.9 1.9 1.9  --   PHOS 6.7*  --   --  2.6 3.0  --  2.9 2.9     Liver Function Tests: Recent Labs  Lab 01/03/21 0803 01/03/21 2053 01/04/21 0435 01/05/21 0500 01/06/21 0335  AST 114*  --   --  86*  --   ALT 100*  --   --  79*  --   ALKPHOS 93  --   --  91  --   BILITOT 0.7  --   --  0.6  --   PROT 6.7  --   --  6.1*  --   ALBUMIN 2.7* 2.4* 2.4* 2.4* 2.3*    No results for input(s): LIPASE, AMYLASE in the last 168 hours. No results for input(s): AMMONIA in the last 168 hours.  CBC: Recent Labs  Lab 01/03/21 0803 01/03/21 1225 01/04/21 0435 01/05/21 0500 01/06/21 0335  WBC 6.8 6.5 6.2 8.1 7.9  HGB 8.6* 8.2* 8.3* 8.3* 8.5*  HCT 27.3* 25.9* 26.8* 26.0* 26.2*  MCV 84.0 84.4 85.4 85.2 84.8  PLT 380 366 240 269  223     Cardiac Enzymes: No results for input(s): CKTOTAL, CKMB, CKMBINDEX, TROPONINI in the last 168 hours.  BNP: Invalid input(s): POCBNP  CBG: Recent Labs  Lab 01/04/21 2033 01/05/21 0525 01/05/21 1609 01/05/21 2140 01/06/21 0558  GLUCAP 114* 119* 135* 114* 116*     Microbiology: Results for orders placed or performed during the hospital encounter of 12/27/20  Resp Panel by RT-PCR (Flu A&B, Covid) Nasopharyngeal Swab     Status: None   Collection Time: 12/27/20  8:53 AM   Specimen: Nasopharyngeal Swab; Nasopharyngeal(NP) swabs in vial transport medium  Result Value Ref Range Status   SARS Coronavirus 2 by RT PCR NEGATIVE NEGATIVE Final    Comment: (NOTE) SARS-CoV-2 target nucleic acids are NOT DETECTED.  The SARS-CoV-2 RNA is generally detectable in upper respiratory specimens during the acute phase of infection. The lowest concentration of SARS-CoV-2 viral copies this assay can detect is 138 copies/mL. A negative result does not preclude SARS-Cov-2 infection and  should not be used as the sole basis for treatment or other patient management decisions. A negative result may occur with  improper specimen collection/handling, submission of specimen other than nasopharyngeal swab, presence of viral mutation(s) within the areas targeted by this assay, and inadequate number of viral copies(<138 copies/mL). A negative result must be combined with clinical observations, patient history, and epidemiological information. The expected result is Negative.  Fact Sheet for Patients:  EntrepreneurPulse.com.au  Fact Sheet for Healthcare Providers:  IncredibleEmployment.be  This test is no t yet approved or cleared by the [place] FDA and  has been authorized for detection and/or diagnosis of SARS-CoV-2 by FDA under an Emergency Use Authorization (EUA). This EUA will remain  in effect (meaning this test can be used) for the duration of the COVID-19 declaration under Section 564(b)(1) of the Act, 21 U.S.C.section 360bbb-3(b)(1), unless the authorization is terminated  or revoked sooner.       Influenza A by PCR NEGATIVE NEGATIVE Final   Influenza B by PCR NEGATIVE NEGATIVE Final    Comment: (NOTE) The Xpert Xpress SARS-CoV-2/FLU/RSV plus assay is intended as an aid in the diagnosis of influenza from Nasopharyngeal swab specimens and should not be used as a sole basis for treatment. Nasal washings and aspirates are unacceptable for Xpert Xpress SARS-CoV-2/FLU/RSV testing.  Fact Sheet for Patients: EntrepreneurPulse.com.au  Fact Sheet for Healthcare Providers: IncredibleEmployment.be  This test is not yet approved or cleared by the [place] FDA and has been authorized for detection and/or diagnosis of SARS-CoV-2 by FDA under an Emergency Use Authorization (EUA). This EUA will remain in effect (meaning this test can be used) for the duration of the COVID-19 declaration  under Section 564(b)(1) of the Act, 21 U.S.C. section 360bbb-3(b)(1), unless the authorization is terminated or revoked.  Performed at [facility], [place] [address] [place], [place] [address]   Surgical pcr screen     Status: None   Collection Time: 01/05/21  7:42 PM   Specimen: Nasal Mucosa; Nasal Swab  Result Value Ref Range Status   MRSA, PCR NEGATIVE NEGATIVE Final   Staphylococcus aureus NEGATIVE NEGATIVE Final    Comment: (NOTE) The Xpert SA Assay (FDA approved for NASAL specimens in patients 71 years of age and older), is one component of a comprehensive surveillance program. It is not intended to diagnose infection nor to guide or monitor treatment. Performed at [facility], [place] [address], [place], [place] [address]     Coagulation Studies: No  results for input(s): LABPROT, INR in the last 72 hours.  Urinalysis: No results for input(s): COLORURINE, LABSPEC, PHURINE, GLUCOSEU, HGBUR, BILIRUBINUR, KETONESUR, PROTEINUR, UROBILINOGEN, NITRITE, LEUKOCYTESUR in the last 72 hours.  Invalid input(s): APPERANCEUR    Imaging: No results found.   Medications:    sodium chloride     sodium chloride     sodium chloride      ceFAZolin (ANCEF) IV      amiodarone  200 mg Oral Daily   Chlorhexidine Gluconate Cloth  6 each Topical Daily   Chlorhexidine Gluconate Cloth  6 each Topical Q0600   cilostazol  50 mg Oral BID   ezetimibe  10 mg Oral Daily   famotidine  20 mg Oral Daily   feeding supplement (NEPRO CARB STEADY)  237 mL Oral BID BM   heparin injection (subcutaneous)  5,000 Units Subcutaneous Q8H   hydrALAZINE  25 mg Oral Q8H   insulin aspart  0-6 Units Subcutaneous TID WC   isosorbide mononitrate  30 mg Oral Daily   lidocaine  1 patch Transdermal Q24H   metoprolol succinate  50 mg Oral Daily   multivitamin  1 tablet Oral QHS   pantoprazole  40 mg Oral QHS   povidone-iodine  2 application Topical Once   rosuvastatin  10 mg Oral Daily   sodium  chloride flush  3 mL Intravenous Q12H   sodium chloride, sodium chloride, sodium chloride, acetaminophen, alteplase, heparin, hydrALAZINE, hydrocortisone cream, HYDROmorphone (DILAUDID) injection, lidocaine (PF), lidocaine-prilocaine, loperamide, loratadine, Muscle Rub, naLOXone (NARCAN)  injection, pentafluoroprop-tetrafluoroeth, sodium chloride, sodium chloride flush  Assessment/ Plan:  1.AKI on CKD 3-4- multifactorial etiology:hemodynamic insults from HTN crisis, renal artery stenosis, afib w/ RVR worsened, obstructive uropathy. Now s/p left ureteral stent placement but with no improvement. -HD started for uremia & failed lasix challenge. Rt fem temp line placed with IR on 9/18.  Hemodialysis started 01/01/2021.  Her last dialysis treatment was 01/05/2021.  Her next dialysis treatment will be 01/07/2021 I would imagine that the dialysis needs again to be ongoing.    2.Hematuria Left obstructive uropathy-  -in the setting of mildly obstructing 6 mm distal left ureter renal stone seen on CT urogram. S/p left ureteral stent on 9/15. ANCA and lupus serologies wnl. Appreciate urology's assistance  3.Intertrochanteric hip fracture- s/p fall in the hospital.  -ortho on board, preop clearance for repair    4.Epistaxis- overnight 9/14, since resolved. Thought to be due to nasal canula. Heparin on hold, continue to monitor. Anca serologies neg  5.Left renal artery stenosis- Hx of 80-90% proximal left renal artery stenosis.  6.Hypertension- BP well controlled on norvasc, metoprolol, and hydralazine.  Ultrafiltration with dialysis blood pressure control improved   7.Hyponatremia-resolved  8.Afib w/ RVR-on amio. Hep gtt on hold given epistaxis, which has resolved.   9.Anemia due to chronic kidney disease-Hgb stable at 8.3   10.Diabetes Mellitus Type 2 with Hyperglycemia- -per primary    LOS: [place] '@TODAY''@9'$ :23 AM

## 2021-01-06 NOTE — Progress Notes (Signed)
PROGRESS NOTE    Molly [name]  [id number] DOB: 1951/04/04 DOA: 12/27/2020 PCP: [name], MD   Brief Narrative:  70 year old female with past medical history of hypertension, left renal artery stenosis ([id number]) diastolic CHF, CKD stage IV, CAD, PAD with bilateral SFA CTO's, anemia chronic disease diabetes presented on 12/27/2020 with 4 days of chest pain.  BP was uncontrolled and spiked as high as 234/208 in the ED.  Chest x-ray consistent with pulmonary edema.  Patient briefly required BiPAP.   Admitted to [facility] service with cardiology consulted. Patient developed A. fib with RVR on 9/13.  Assessment & Plan:   Principal Problem:   Acute on chronic respiratory failure with hypoxia and hypercapnia (HCC) Active Problems:   HTN (hypertension)   Type 2 diabetes mellitus with stage 3b chronic kidney disease, with long-term current use of insulin (HCC)   Elevated troponin   Acute on chronic heart failure with preserved ejection fraction (HFpEF) ([place])   CAP (community acquired pneumonia)   Anemia   Abnormal CT of the abdomen  Acute metabolic/uremic encephalopathy: Resolved, alert and oriented.  Continues to improve with dialysis.  Acute respiratory failure with hypoxia secondary to recurrent flash pulmonary edema/acute systolic congestive heart failure: Echo shows reduced ejection fraction to 40 to 45% compared to the previous 1.  Cardiology on board and managing.  Due to rising creatinine, she is on diuretics holiday.  No plans for diuretics per cardiology.  She is improving with dialysis and her oxygen requirement is only 1 L.  Fall in hospital/left hip fracture: CT left hip indicates left hip fracture.  Orthopedics on board.  Plan for surgical repair once she is more stable.  Scheduled for surgery today.  Appreciate orthopedics help.  Hypertensive emergency with chest pain: No chest pain and blood pressure controlled now.  Continue amlodipine 10 mg p.o. daily, hydralazine 25 mg  p.o. 3 times daily Imdur to 30 mg p.o. daily, Toprol-XL 50 mg p.o. daily.  Left renal artery stenosis: Has a history of 80 to 90% of proximal left renal artery stenosis.  Plan was to do angiogram and stent but due to rising creatinine, this plan is on hold for now.  Cardiology and nephrology managing.  New onset A. fib with RVR: Developed overnight 12/28/2020.  She was initially started on amiodarone drip and converted back to sinus rhythm. Not on any anticoagulation due to slight drop in hemoglobin.  However hemoglobin has remained stable since 12/30/2020.  Patient also underwent EGD today 01/05/2021, no signs of bleeding.  GI cleared her for anticoagulation.  Will defer to cardiology to initiate anticoagulation.  She is in sinus rhythm now  Possible gastroduodenitis: CT abdomen suggests possible gastroduodenitis.  Status post EGD 01/05/2021, no signs of gastroduodenitis.  Remains on PPI.  Epistaxis: Nosebleed overnight 12/30/2018, no further episodes.  Heparin was stopped and has remained on hold since then.  AKI on CKD stage IV/progressive renal failure/obstructive uropathy/left ureteral stone/left hydronephrosis/dross hematuria: CT renal stone shows 6 mm of left ureteral stone causing obstruction/hydronephrosis.  Patient underwent cystoscopy with left ureteral stent placement on 12/30/2020.  TDC was placed on 01/01/2021 and she received first session of dialysis and second on 01/03/2021.  Nephrology consulting with [facility] for permanent access.  Hyponatremia: Improving.    Community-acquired pneumonia: Completed 5 days of Rocephin on 12/31/2020.  Elevated troponin/demand ischemia: In the setting of hypertensive crisis.  EKG without ischemic changes.  Likely demand ischemia. 397 > 376.  Cardiology on board.  No plan for  intervention.  Type 2 diabetes mellitus: Recent hemoglobin A1c 8.2% on 11/27/2020.  Home regimen includes Humulin 70/30 mix 8 to 12 units daily.  Due to her n.p.o. status and hypoglycemia,  70/30 was discontinued.  She is on SSI and blood sugar controlled.  Watch closely.  Dyslipidemia: Continue Zetia and Crestor.  PAD: Continue statin and Pletal.  Anemia of chronic disease: Hemoglobin has remained stable since 12/30/2020.  CT renal stone indicates possible gastritis or gastric ulcer.  GI on board.  GERD: Continue PPI.  Moderate protein calorie malnutrition: Seen by nutrition.  Remains on Prosource 3 times daily.  Abdominal tenderness: Resolved.  DVT prophylaxis: heparin injection 5,000 Units Start: 01/03/21 1400   Code Status: Full Code  Family Communication: Patient's husband present at bedside.  Plan of care discussed with him in length.    Status is: Inpatient  Remains inpatient appropriate because:Ongoing diagnostic testing needed not appropriate for outpatient work up  Dispo: The patient is from: Home              Anticipated d/c is to: Home              Patient currently is not medically stable to d/c.   Difficult to place patient No        Estimated body mass index is 25.85 kg/m as calculated from the following:   Height as of this encounter: '5\' 2"'$  (1.575 m).   Weight as of this encounter: 64.1 kg.     Nutritional Assessment: Body mass index is 25.85 kg/m.Marland Kitchen Seen by dietician.  I agree with the assessment and plan as outlined below: Nutrition Status: Nutrition Problem: Inadequate oral intake Etiology: inability to eat Signs/Symptoms: NPO status Interventions: Tube feeding Skin Assessment: I have examined the patient's skin and I agree with the wound assessment as performed by the wound care RN as outlined below:    Consultants:  Nephrology Cardiology Orthopedic Urology-signed off GI  Procedures:  As above  Antimicrobials:  Anti-infectives (From admission, onward)    Start     Dose/Rate Route Frequency Ordered Stop   01/06/21 1400  ceFAZolin (ANCEF) IVPB 2g/100 mL premix        2 g 200 mL/hr over 30 Minutes Intravenous On call to  O.R. 01/05/21 1926 01/07/21 0559   12/31/20 1330  cefTRIAXone (ROCEPHIN) 1 g in sodium chloride 0.9 % 100 mL IVPB        1 g 200 mL/hr over 30 Minutes Intravenous  Once 12/31/20 1315 12/31/20 1350   12/31/20 1000  cefTRIAXone (ROCEPHIN) 1 g in sodium chloride 0.9 % 100 mL IVPB  Status:  Discontinued        1 g 200 mL/hr over 30 Minutes Intravenous Every 24 hours 12/30/20 1202 12/31/20 1315   12/27/20 1530  cefTRIAXone (ROCEPHIN) 2 g in sodium chloride 0.9 % 100 mL IVPB  Status:  Discontinued        2 g 200 mL/hr over 30 Minutes Intravenous Every 24 hours 12/27/20 1435 12/30/20 1202          Subjective: Seen and examined.  Fully alert and oriented.  No complaints.  Husband at bedside.  She is not excited about the surgery but she is ready.  Objective: Vitals:   01/06/21 0034 01/06/21 0359 01/06/21 0749 01/06/21 1110  BP: (!) 131/46 (!) 147/47 (!) 141/51 (!) 146/51  Pulse: 70 69 64 72  Resp: 16 17    Temp: 97.7 F (36.5 C) 98.8 F (37.1 C) 98.8 F (  37.1 C)   TempSrc: Oral Oral Oral   SpO2: 95% 100% 99% 98%  Weight:  64.1 kg    Height:        Intake/Output Summary (Last 24 hours) at 01/06/2021 1120 Last data filed at 01/06/2021 0900 Gross per 24 hour  Intake 0 ml  Output 2000 ml  Net -2000 ml    Filed Weights   01/05/21 1003 01/05/21 1316 01/06/21 0359  Weight: 66.4 kg 63.1 kg 64.1 kg    Examination: General exam: Appears calm and comfortable  Respiratory system: Clear to auscultation. Respiratory effort normal. Cardiovascular system: S1 & S2 heard, RRR. No JVD, murmurs, rubs, gallops or clicks. No pedal edema. Gastrointestinal system: Abdomen is nondistended, soft and nontender. No organomegaly or masses felt. Normal bowel sounds heard. Central nervous system: Alert and oriented. No focal neurological deficits. Skin: No rashes, lesions or ulcers.  Psychiatry: Judgement and insight appear normal. Mood & affect appropriate.    Data Reviewed: I have personally  reviewed following labs and imaging studies  CBC: Recent Labs  Lab 01/03/21 0803 01/03/21 1225 01/04/21 0435 01/05/21 0500 01/06/21 0335  WBC 6.8 6.5 6.2 8.1 7.9  HGB 8.6* 8.2* 8.3* 8.3* 8.5*  HCT 27.3* 25.9* 26.8* 26.0* 26.2*  MCV 84.0 84.4 85.4 85.2 84.8  PLT 380 366 240 269 [id number]    Basic Metabolic Panel: Recent Labs  Lab 01/01/21 0242 01/03/21 0803 01/03/21 1815 01/03/21 2053 01/04/21 0435 01/04/21 1732 01/05/21 0500 01/06/21 0335  NA 125* 134*  --  135 133*  --  133* 134*  K 4.8 3.6  --  3.6 3.6  --  3.6 3.6  CL 84* 93*  --  98 96*  --  96* 98  CO2 20* 24  --  24 25  --  26 28  GLUCOSE 203* 110*  --  131* 146*  --  122* 121*  BUN 76* 48*  --  26* 30*  --  34* 18  CREATININE 6.50* 4.89*  --  3.21* 3.39*  --  4.01* 2.90*  CALCIUM 7.5* 8.6*  --  8.0* 8.0*  --  8.2* 8.0*  MG  --  2.0 1.9  --  1.9 1.9 1.9  --   PHOS 6.7*  --   --  2.6 3.0  --  2.9 2.9    GFR: Estimated Creatinine Clearance: 16.3 mL/min (A) (by C-G formula based on SCr of 2.9 mg/dL (H)). Liver Function Tests: Recent Labs  Lab 01/03/21 0803 01/03/21 2053 01/04/21 0435 01/05/21 0500 01/06/21 0335  AST 114*  --   --  86*  --   ALT 100*  --   --  79*  --   ALKPHOS 93  --   --  91  --   BILITOT 0.7  --   --  0.6  --   PROT 6.7  --   --  6.1*  --   ALBUMIN 2.7* 2.4* 2.4* 2.4* 2.3*    No results for input(s): LIPASE, AMYLASE in the last 168 hours. No results for input(s): AMMONIA in the last 168 hours. Coagulation Profile: No results for input(s): INR, PROTIME in the last 168 hours. Cardiac Enzymes: No results for input(s): CKTOTAL, CKMB, CKMBINDEX, TROPONINI in the last 168 hours. BNP (last 3 results) No results for input(s): PROBNP in the last 8760 hours. HbA1C: No results for input(s): HGBA1C in the last 72 hours. CBG: Recent Labs  Lab 01/05/21 0525 01/05/21 1609 01/05/21 2140 01/06/21 0558 01/06/21 1042  GLUCAP 119* 135* 114* 116* 114*    Lipid Profile: No results for  input(s): CHOL, HDL, LDLCALC, TRIG, CHOLHDL, LDLDIRECT in the last 72 hours. Thyroid Function Tests: No results for input(s): TSH, T4TOTAL, FREET4, T3FREE, THYROIDAB in the last 72 hours. Anemia Panel: No results for input(s): VITAMINB12, FOLATE, FERRITIN, TIBC, IRON, RETICCTPCT in the last 72 hours. Sepsis Labs: No results for input(s): PROCALCITON, LATICACIDVEN in the last 168 hours.   Recent Results (from the past 240 hour(s))  Surgical pcr screen     Status: None   Collection Time: 01/05/21  7:42 PM   Specimen: Nasal Mucosa; Nasal Swab  Result Value Ref Range Status   MRSA, PCR NEGATIVE NEGATIVE Final   Staphylococcus aureus NEGATIVE NEGATIVE Final    Comment: (NOTE) The Xpert SA Assay (FDA approved for NASAL specimens in patients 66 years of age and older), is one component of a comprehensive surveillance program. It is not intended to diagnose infection nor to guide or monitor treatment. Performed at [facility], [place] [address], [place], [place] [address]        Radiology Studies: No results found.  Scheduled Meds:  amiodarone  200 mg Oral Daily   Chlorhexidine Gluconate Cloth  6 each Topical Daily   Chlorhexidine Gluconate Cloth  6 each Topical Q0600   cilostazol  50 mg Oral BID   ezetimibe  10 mg Oral Daily   famotidine  20 mg Oral Daily   feeding supplement (NEPRO CARB STEADY)  237 mL Oral BID BM   heparin injection (subcutaneous)  5,000 Units Subcutaneous Q8H   hydrALAZINE  25 mg Oral Q8H   insulin aspart  0-6 Units Subcutaneous TID WC   isosorbide mononitrate  30 mg Oral Daily   lidocaine  1 patch Transdermal Q24H   metoprolol succinate  50 mg Oral Daily   multivitamin  1 tablet Oral QHS   pantoprazole  40 mg Oral QHS   povidone-iodine  2 application Topical Once   rosuvastatin  10 mg Oral Daily   sodium chloride flush  3 mL Intravenous Q12H   Continuous Infusions:  sodium chloride     sodium chloride     sodium chloride      ceFAZolin (ANCEF)  IV       LOS: 10 days   Time spent: 29 minutes   Darliss Cheney, MD [facility]  01/06/2021, 11:20 AM  Please page via [facility] and do not message via secure chat for anything urgent. Secure chat can be used for anything non urgent and I will respond at my earliest availability.  How to contact the [facility] Attending or Consulting provider [place] or covering provider during after hours [place], for this patient?  Check the care team in [facility] and look for a) attending/consulting [facility] provider listed and b) the [facility] team listed. Page or secure chat [phone]. Log into [email or web address] and use [place]'s universal password to access. If you do not have the password, please contact the hospital operator. Locate the [facility] provider you are looking for under [facility] and page to a number that you can be directly reached. If you still have difficulty reaching the provider, please page the [facility] (Director on Call) for the Hospitalists listed on amion for assistance.

## 2021-01-06 NOTE — Progress Notes (Signed)
Cardiology Progress Note  Patient ID: Molly [name] MRN: [id number] DOB: 05/16/1950 Date of Encounter: 01/06/2021  Primary Cardiologist: Jenne Campus, MD  Subjective   Chief Complaint: L hip pain  HPI: Doing well this morning.  EGD without significant signs of bleeding.  Plans for hip surgery today.  Tolerating hemodialysis well.  ROS:  All other ROS reviewed and negative. Pertinent positives noted in the HPI.     Inpatient Medications  Scheduled Meds:  amiodarone  200 mg Oral Daily   Chlorhexidine Gluconate Cloth  6 each Topical Daily   Chlorhexidine Gluconate Cloth  6 each Topical Q0600   cilostazol  50 mg Oral BID   ezetimibe  10 mg Oral Daily   famotidine  20 mg Oral Daily   feeding supplement (NEPRO CARB STEADY)  237 mL Oral BID BM   heparin injection (subcutaneous)  5,000 Units Subcutaneous Q8H   hydrALAZINE  25 mg Oral Q8H   insulin aspart  0-6 Units Subcutaneous TID WC   isosorbide mononitrate  30 mg Oral Daily   lidocaine  1 patch Transdermal Q24H   metoprolol succinate  50 mg Oral Daily   multivitamin  1 tablet Oral QHS   pantoprazole  40 mg Oral QHS   povidone-iodine  2 application Topical Once   rosuvastatin  10 mg Oral Daily   sodium chloride flush  3 mL Intravenous Q12H   Continuous Infusions:  sodium chloride     sodium chloride     sodium chloride      ceFAZolin (ANCEF) IV     PRN Meds: sodium chloride, sodium chloride, sodium chloride, acetaminophen, alteplase, heparin, hydrALAZINE, hydrocortisone cream, HYDROmorphone (DILAUDID) injection, lidocaine (PF), lidocaine-prilocaine, loperamide, loratadine, Muscle Rub, naLOXone (NARCAN)  injection, pentafluoroprop-tetrafluoroeth, sodium chloride, sodium chloride flush   Vital Signs   Vitals:   01/05/21 1928 01/06/21 0034 01/06/21 0359 01/06/21 0749  BP: (!) 139/48 (!) 131/46 (!) 147/47 (!) 141/51  Pulse: 67 70 69 64  Resp: '17 16 17   '$ Temp: 98.8 F (37.1 C) 97.7 F (36.5 C) 98.8 F (37.1 C) 98.8 F  (37.1 C)  TempSrc: Oral Oral Oral Oral  SpO2: 100% 95% 100% 99%  Weight:   64.1 kg   Height:        Intake/Output Summary (Last 24 hours) at 01/06/2021 1045 Last data filed at 01/05/2021 1308 Gross per 24 hour  Intake --  Output 2000 ml  Net -2000 ml   Last 3 Weights 01/06/2021 01/05/2021 01/05/2021  Weight (lbs) 141 lb 5 oz 139 lb 1.8 oz 146 lb 6.2 oz  Weight (kg) 64.1 kg 63.1 kg 66.4 kg      Telemetry  Overnight telemetry shows sinus rhythm in the 70s, which I personally reviewed.   Physical Exam   Vitals:   01/05/21 1928 01/06/21 0034 01/06/21 0359 01/06/21 0749  BP: (!) 139/48 (!) 131/46 (!) 147/47 (!) 141/51  Pulse: 67 70 69 64  Resp: '17 16 17   '$ Temp: 98.8 F (37.1 C) 97.7 F (36.5 C) 98.8 F (37.1 C) 98.8 F (37.1 C)  TempSrc: Oral Oral Oral Oral  SpO2: 100% 95% 100% 99%  Weight:   64.1 kg   Height:        Intake/Output Summary (Last 24 hours) at 01/06/2021 1045 Last data filed at 01/05/2021 1308 Gross per 24 hour  Intake --  Output 2000 ml  Net -2000 ml    Last 3 Weights 01/06/2021 01/05/2021 01/05/2021  Weight (lbs) 141 lb 5 oz  139 lb 1.8 oz 146 lb 6.2 oz  Weight (kg) 64.1 kg 63.1 kg 66.4 kg    Body mass index is 25.85 kg/m.   General: Well nourished, well developed, in no acute distress Head: Atraumatic, normal size  Eyes: PEERLA, EOMI  Neck: Supple, no JVD Endocrine: No thryomegaly Cardiac: Normal S1, S2; RRR; no murmurs, rubs, or gallops Lungs: Clear to auscultation bilaterally, no wheezing, rhonchi or rales  Abd: Soft, nontender, no hepatomegaly  Ext: No edema, pulses 2+ Musculoskeletal: No deformities, BUE and BLE strength normal and equal Skin: Warm and dry, no rashes   Neuro: Alert and oriented to person, place, time, and situation, CNII-XII grossly intact, no focal deficits  Psych: Normal mood and affect   Labs  High Sensitivity Troponin:   Recent Labs  Lab 12/27/20 0640 12/27/20 0835  TROPONINIHS 397* 376*     Cardiac EnzymesNo results  for input(s): TROPONINI in the last 168 hours. No results for input(s): TROPIPOC in the last 168 hours.  Chemistry Recent Labs  Lab 01/03/21 0803 01/03/21 2053 01/04/21 0435 01/05/21 0500 01/06/21 0335  NA 134*   < > 133* 133* 134*  K 3.6   < > 3.6 3.6 3.6  CL 93*   < > 96* 96* 98  CO2 24   < > '25 26 28  '$ GLUCOSE 110*   < > 146* 122* 121*  BUN 48*   < > 30* 34* 18  CREATININE 4.89*   < > 3.39* 4.01* 2.90*  CALCIUM 8.6*   < > 8.0* 8.2* 8.0*  PROT 6.7  --   --  6.1*  --   ALBUMIN 2.7*   < > 2.4* 2.4* 2.3*  AST 114*  --   --  86*  --   ALT 100*  --   --  79*  --   ALKPHOS 93  --   --  91  --   BILITOT 0.7  --   --  0.6  --   GFRNONAA 9*   < > 14* 12* 17*  ANIONGAP 17*   < > '12 11 8   '$ < > = values in this interval not displayed.    Hematology Recent Labs  Lab 01/04/21 0435 01/05/21 0500 01/06/21 0335  WBC 6.2 8.1 7.9  RBC 3.14* 3.05* 3.09*  HGB 8.3* 8.3* 8.5*  HCT 26.8* 26.0* 26.2*  MCV 85.4 85.2 84.8  MCH 26.4 27.2 27.5  MCHC 31.0 31.9 32.4  RDW 15.9* 16.2* 16.3*  PLT 240 269 223   BNPNo results for input(s): BNP, PROBNP in the last 168 hours.  DDimer No results for input(s): DDIMER in the last 168 hours.   Radiology  No results found.  Cardiac Studies  TTE 12/28/2020  1. Compared with the echo A999333, systolic function is reduced. Left  ventricular ejection fraction, by estimation, is 40 to 45%. The left  ventricle has mildly decreased function. The left ventricle demonstrates  global hypokinesis. Left ventricular  diastolic parameters are indeterminate.   2. Right ventricular systolic function is normal. The right ventricular  size is normal. There is moderately elevated pulmonary artery systolic  pressure.   3. The pericardial effusion is circumferential.   4. The mitral valve is normal in structure. Mild mitral valve  regurgitation. No evidence of mitral stenosis.   5. The aortic valve is tricuspid. There is mild calcification of the  aortic valve. There  is mild thickening of the aortic valve. Aortic valve  regurgitation is not  visualized. No aortic stenosis is present.   6. The inferior vena cava is dilated in size with <50% respiratory  variability, suggesting right atrial pressure of 15 mmHg.   Patient Profile  70 year old female with history of CKD stage IV, diastolic heart failure, PAD (bilateral SFA CTO's), left renal artery stenosis (90%), diabetes, CAD who was admitted on 12/27/2020 with chest pain and acute hypoxic respiratory failure secondary to pulmonary edema from likely hypertensive crisis.  Course complicated by A. fib with RVR 12/28/2020.    Assessment & Plan   #A. fib with RVR -Difficult to control rates and she was started on amiodarone.  She has converted back to sinus rhythm.  Maintaining sinus rhythm. -We will continue amiodarone 200 mg daily.  Would anticipate 1 month of therapy to get her through her critical illness.  She is improving. -No signs of bleeding on EGD. -We will restart Eliquis likely tomorrow.  She has hip surgery today.  #Acute blood loss anemia #Gastritis #Nosebleeds -Anticoagulation was held due to drop in hemoglobin with nosebleeds.  EGD shows no evidence of bleeding. -We will likely restart anticoagulation once surgery details as it is okay tomorrow.  She will have hip surgery today.  #Acute hypoxic respiratory failure #CKD stage IV with AKI now on hemodialysis #Recurrent flash pulmonary edema #Systolic heart failure, A999333 #Volume overload/hyponatremia/obstructive uropathy #Left renal artery stenosis -Admitted with recurrent flash pulmonary edema and hypertensive crisis.  She has known left renal artery stenosis, 80 to 90%.  We will likely address this at some point with stenting.  Suspect this will reduce the symptoms of flash pulmonary edema that she has been experiencing. -She is now progressed what is likely ESRD per renal input.  Now on hemodialysis.  Volume status is maintained by  this. -EF 40 to 45%.  Suspect this was arrhythmia related in the setting of critical illness. -Maintaining sinus rhythm on amiodarone. 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 -We will continue metoprolol succinate 50 mg daily.  Hydralazine 25 mg 3 times daily, Imdur 30 mg daily.  #Elevated troponin -Demand.  Not ACS.  #Left hip fracture -Operative management today.  No issues with proceeding from cardiology.  For questions or updates, please contact [place] Please consult [email or web address] for contact info under   Time Spent with Patient: I have spent a total of 35 minutes with patient reviewing hospital notes, telemetry, EKGs, labs and examining the patient as well as establishing an assessment and plan that was discussed with the patient.  > 50% of time was spent in direct patient care.    Signed, Addison Naegeli. Audie Box, MD, [place]  01/06/2021 10:45 AM

## 2021-01-06 NOTE — Op Note (Signed)
Molly [name] female 70 y.o. 01/06/2021  PreOperative Diagnosis: Left intertochanteric hip fracture  PostOperative Diagnosis: same  PROCEDURE: Cephalomedullary nail of Left hip fracture  SURGEON: Melony Overly, MD  ASSISTANT:  none  ANESTHESIA: General endotracheal tube anesthesia  FINDINGS: Displaced intertrochanteric hip fracture  IMPLANTS: Affixes hip fracture nail [id number]    INDICATIONS:70 y.o. female had a fall and sustained a intertrochanteric hip fracture.  She was indicated for surgery due to baseline ambulatory status and significant pain due to the fracture.  Her fixation was delayed due to mental status change and need for hemodialysis.  Once he was medically cleared she was taken the operative suite for surgery.  Discussion was had with the patient about the surgery and they wish to proceed.  Patient understood the risks, benefits and alternatives to surgery which include but are not limited to wound healing complications, infection, nonunion, malunion, need for further surgery as well as damage to surrounding structures. They also understood the potential for continued pain in that there were no guarantees of acceptable outcome After weighing these risks the patient opted to proceed with surgery.  PROCEDURE: Patient was identified in the preoperative holding area and the left hip was marked by myself.  The consent was signed by myself and the patient.  This identified the left lower extremity as the appropriate operative extremity and the surgeries to be performed was operative treatment of the hip fracture. They  were taken to the operating room where spinal anesthesia was performed.  And then was moved supine on a Hana table.  The operative leg was placed into the traction boot.  The non-operative leg was well-padded and affixed to the right leg holder with coban.  The arm was crossed over the chest and well padded. 2 g of Ancef was given.  Surgical timeout was  performed.    Using fluoroscopy appropriate AP and lateral x-rays of the hip were obtained.  Then using traction through the Hana table was able to reduce the fracture fragments in a closed fashion.  Acceptable reduction was confirmed on AP and lateral x-rays.  Then the left hip was prepped and draped in usual sterile fashion.  A shower curtain drape was placed.  We began the surgery by placing the guidepin percutaneously at the appropriate starting point at the tip of the greater trochanter.  The appropriate starting point was confirmed on AP and lateral radiograph.  Then a 3 cm incision was made about the site of the percutaneous placement of the pin and the opening reamer with a soft tissue guide was placed into the wound down to the tip of the greater trochanter and the bone was entered with the opening reamer down to the level of lesser trochanter.  Then the openning reamer and guide pin were removed and a 160m nail was placed. Then we chose an 9 mm cephalo-medullary nail.  This was placed without difficulty and appropriate positioning was confirmed on fluoroscopy.  Then using the blade insertion guide a second incision was made distally down the thigh to allow for placement of the cephalo-medullary screw.  The guidepin for the blade was placed up the femoral neck in the appropriate position center center in the femoral head.  Then the reamer was used to ream for the blade and the blade was placed without difficulty.  The length of the blade was a 95 mm.  Then the set screw was tightened.  Then the distal interlock screw was placed without difficulty.  Final  films were taken.  The wounds were irrigated with normal saline and the incisions were closed in a layered fashion using 2-0 monocryl and staples.  Mepilex dressings were used.  Patient was then transferred to the hospital bed and awakened from anesthesia without difficulty.  Counts were correct at the end the case.  There were no  complications.  POST OPERATIVE INSTRUCTIONS: WBAT operative extremity Patient will be discharged on 325 mg aspirin for DVT prophylaxis if not already on DVT prophylaxis at baseline Follow-up in 2 weeks for x-rays of the operative hip and suture removal if appropriate    BLOOD LOSS:  less than 100 mL         DRAINS: none         SPECIMEN: none       COMPLICATIONS:  * No complications entered in OR log *         Disposition: PACU - hemodynamically stable.         Condition: stable

## 2021-01-06 NOTE — Anesthesia Postprocedure Evaluation (Signed)
Anesthesia Post Note  Patient: Molly [name]  Procedure(s) Performed: CYSTOSCOPY WITH RETROGRADE PYELOGRAM/URETERAL STENT PLACEMENT (Left)     Patient location during evaluation: PACU Anesthesia Type: General Level of consciousness: sedated Pain management: pain level controlled Vital Signs Assessment: post-procedure vital signs reviewed and stable Respiratory status: spontaneous breathing and respiratory function stable Cardiovascular status: stable Postop Assessment: no apparent nausea or vomiting Anesthetic complications: no   No notable events documented.  Last Vitals:  Vitals:   01/06/21 1630 01/06/21 1647  BP: (!) 160/57 (!) 153/53  Pulse:  68  Resp:    Temp: 36.7 C   SpO2:  98%    Last Pain:  Vitals:   01/06/21 1815  TempSrc:   PainSc: Asleep                 Merlinda Frederick

## 2021-01-06 NOTE — Anesthesia Postprocedure Evaluation (Signed)
Anesthesia Post Note  Patient: Molly [name]  Procedure(s) Performed: INTRAMEDULLARY (IM) NAIL INTERTROCHANTRIC (Left: Hip)     Patient location during evaluation: PACU Anesthesia Type: Spinal Level of consciousness: awake and alert Pain management: pain level controlled Vital Signs Assessment: post-procedure vital signs reviewed and stable Respiratory status: spontaneous breathing, respiratory function stable, nonlabored ventilation and patient connected to nasal cannula oxygen Cardiovascular status: blood pressure returned to baseline and stable Postop Assessment: no headache, no backache, no apparent nausea or vomiting and spinal receding Anesthetic complications: no   No notable events documented.  Last Vitals:  Vitals:   01/06/21 1630 01/06/21 1647  BP: (!) 160/57 (!) 153/53  Pulse:  68  Resp:    Temp: 36.7 C   SpO2:  98%    Last Pain:  Vitals:   01/06/21 1647  TempSrc:   PainSc: Asleep                 Ayahna Solazzo A.

## 2021-01-06 NOTE — Progress Notes (Signed)
     Molly [name] is a 70 y.o. female   Orthopaedic diagnosis: Left intertrochanteric hip fracture  Subjective: Patient is resting comfortably.  She has lidocaine patch on the left hip.  Objectyive: Vitals:   01/06/21 1110 01/06/21 1243  BP: (!) 146/51 (!) 165/54  Pulse: 72 73  Resp:  17  Temp:  98.9 F (37.2 C)  SpO2: 98% 100%     Exam: Awake and alert Respirations even and unlabored No acute distress  Left hip in normal resting position.  Lidocaine patch present.  Tender to palpation about the hip.  Assessment: Left intertrochanteric hip fracture   Plan: Cephalomedullary nail for left intertrochanteric hip fracture.  Postoperatively I am hopeful that she will be weightbearing as tolerated.  I discussed the risk, benefits and alternatives of the surgery with the patient's husband.  He is in agreement to surgery.  We will proceed.  Resting discussed include but are not limited to wound healing complications, infection, nonunion, malunion, need for further surgery and damage to surrounding structures.  We briefly discussed the anesthetic risks which include death.   Radene Journey, MD

## 2021-01-06 NOTE — Anesthesia Procedure Notes (Signed)
Spinal  Patient location during procedure: OR Start time: 01/06/2021 2:15 PM End time: 01/06/2021 2:19 PM Reason for block: surgical anesthesia Staffing Performed: anesthesiologist  Anesthesiologist: Josephine Igo, MD Preanesthetic Checklist Completed: patient identified, IV checked, site marked, risks and benefits discussed, surgical consent, monitors and equipment checked, pre-op evaluation and timeout performed Spinal Block Patient position: left lateral decubitus Prep: DuraPrep and site prepped and draped Patient monitoring: heart rate, cardiac monitor, continuous pulse ox and blood pressure Approach: midline Location: L3-4 Injection technique: single-shot Needle Needle type: Pencan  Needle gauge: 24 G Needle length: 9 cm Needle insertion depth: 6 cm Assessment Sensory level: T6 Events: CSF return Additional Notes Patient tolerated procedure well. Adequate sensory level.

## 2021-01-06 NOTE — Anesthesia Procedure Notes (Signed)
Procedure Name: MAC Date/Time: 01/06/2021 2:19 PM Performed by: Jenne Campus, CRNA Pre-anesthesia Checklist: Patient identified, Emergency Drugs available, Suction available and Patient being monitored Oxygen Delivery Method: Simple face mask

## 2021-01-06 NOTE — Consult Note (Addendum)
Hospital Consult    Reason for Consult: Dialysis access Requesting Physician: Dr. Justin Mend MRN #:  [id number]  History of Present Illness: This is a 70 y.o. female with past medical history significant for hypertension, CHF, diabetes mellitus type 2, and CKD who was admitted to the hospital originally for cardiac rule out.  She is being seen in consultation for evaluation for placement of permanent dialysis access.  She was right arm dominant and would prefer access placement in her left arm.  She has not had a pacemaker.  She also denies any history of trauma or surgeries to either arm.  She is currently on heparin for atrial fibrillation.  She has a temporary dialysis catheter in her right common femoral vein.  She has not yet had vein mapping.  Plans noted for repair of fractured left hip secondary to hospital fall.  Past Medical History:  Diagnosis Date   Abdominal aortic atherosclerosis with stenosis 06/29/2015   AKI (acute kidney injury) ([place]) 06/29/2015   Atherosclerosis of coronary artery without angina pectoris 04/21/2020   Atopic dermatitis 04/21/2020   Benign hypertension with CKD (chronic kidney disease) stage III ([place]) 04/21/2020   Bilateral impacted cerumen 08/21/2019   Chest pain 08/31/2020   Cholecystitis 06/29/2015   Cholelithiasis 06/29/2015   Claudication in peripheral vascular disease ([place]) 08/28/2019   Peripheral arterial disease   Colon cancer screening 04/21/2020   Dehydration with hyponatremia 06/29/2015   Diabetes mellitus ([place]) 07/15/2018   Diarrhea 04/21/2020   DKA (diabetic ketoacidoses) 06/29/2015   Dyslipidemia 08/30/2018   Elevated troponin    Epigastric pain 04/21/2020   Gastroesophageal reflux disease 04/21/2020   HTN (hypertension) 06/29/2015   Hyperglycemia due to type 2 diabetes mellitus ([place]) 04/21/2020   Hypertension    Hypertensive emergency 07/12/2020   Hypertensive heart disease without congestive heart failure 04/21/2020   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   Proptosis 04/21/2020   Proximal leg weakness 09/10/2018   Ptosis of left eyelid 09/10/2018   Renal artery stenosis ([place]) 12/17/2019   Renal artery stenosis   Standard chest x-ray abnormal 04/21/2020   Vitamin D deficiency 04/21/2020   Weight loss 04/21/2020    Past Surgical History:  Procedure Laterality Date   ABDOMINAL AORTOGRAM W/LOWER EXTREMITY N/A 08/28/2019   Procedure: ABDOMINAL AORTOGRAM W/LOWER EXTREMITY;  Surgeon: Lorretta Harp, MD;  Location: [place] [facility];  Service: Cardiovascular;  Laterality: N/A;   BIOPSY  01/05/2021   Procedure: BIOPSY;  Surgeon: Gatha Mayer, MD;  Location: [facility];  Service: Endoscopy;;   CATARACT EXTRACTION, BILATERAL     CHOLECYSTECTOMY N/A 06/30/2015   Procedure: LAPAROSCOPIC CHOLECYSTECTOMY WITH INTRAOPERATIVE CHOLANGIOGRAM;  Surgeon: Donnie Mesa, MD;  Location: [facility];  Service: General;  Laterality: N/A;   CYSTOSCOPY W/ URETERAL STENT PLACEMENT Left 12/30/2020   Procedure: CYSTOSCOPY WITH RETROGRADE PYELOGRAM/URETERAL STENT PLACEMENT;  Surgeon: Janith Lima, MD;  Location: [facility];  Service: Urology;  Laterality: Left;   ESOPHAGOGASTRODUODENOSCOPY (EGD) WITH PROPOFOL  N/A 01/05/2021   Procedure: ESOPHAGOGASTRODUODENOSCOPY (EGD) WITH PROPOFOL;  Surgeon: Gatha Mayer, MD;  [facility];  Service: Endoscopy;  Laterality: N/A;   IR FLUORO GUIDE CV LINE RIGHT  01/01/2021   IR US GUIDE VASC ACCESS RIGHT   01/01/2021   PERIPHERAL VASCULAR BALLOON ANGIOPLASTY Right 08/28/2019   Procedure: PERIPHERAL VASCULAR BALLOON ANGIOPLASTY;  Surgeon: Lorretta Harp, MD;  Location: [place] [facility];  Service: Cardiovascular;  Laterality: Right;  attempted SFA    Allergies  Allergen Reactions   Lisinopril Other (See Comments)   Mercury Nausea And Vomiting    Prior to Admission medications   Medication Sig Start Date End Date Taking? Authorizing Provider  amLODipine (NORVASC) 10 MG tablet Take 1 tablet (10 mg total) by mouth daily. 12/03/20  Yes Domenic Polite, MD  Cholecalciferol 25 MCG (1000 UT) tablet Take 2,000 Units by mouth daily.   Yes [provider]  cilostazol (PLETAL) 50 MG tablet Take 1 tablet (50 mg total) by mouth 2 (two) times daily. 03/15/20  Yes Lorretta Harp, MD  coconut oil OIL Apply 1 application topically as needed (itching).   Yes [provider]  ezetimibe (ZETIA) 10 MG tablet Take 1 tablet (10 mg total) by mouth daily. 10/28/20 07/25/21 Yes Park Liter, MD  famotidine (PEPCID) 20 MG tablet Take 20 mg by mouth in the morning and at bedtime.   Yes [provider]  furosemide (LASIX) 40 MG tablet Take 1 tablet (40 mg total) by mouth daily. 12/03/20  Yes Domenic Polite, MD  hydrALAZINE (APRESOLINE) 25 MG tablet Take 1 tablet (25 mg total) by mouth every 12 (twelve) hours. 10/28/20 07/25/21 Yes Park Liter, MD  hydrocortisone cream 1 % Apply 1 application topically 4 (four) times daily as needed for itching.   Yes [provider]  insulin isophane & regular human KwikPen (HUMULIN 70/30 MIX) (70-30) 100 UNIT/ML KwikPen Inject 8-12 Units into the skin daily. 12/03/20  Yes Domenic Polite, MD  ketoconazole (NIZORAL) 2 % cream Apply 1 application topically daily as needed for irritation.  05/08/19  Yes [provider]  levocetirizine (XYZAL) 5 MG tablet Take 5 mg by mouth daily as needed for allergies. 05/12/19  Yes [provider]  loperamide (IMODIUM A-D) 2 MG tablet Take 2 mg by mouth 4 (four) times daily as needed for diarrhea or loose stools.   Yes [provider]  Pediatric Multivitamins-Iron (FLINTSTONES COMPLETE) 18 MG CHEW Chew 1 tablet by mouth daily. Unknown strength   Yes [provider]  rosuvastatin (CRESTOR) 40 MG tablet TAKE 1 TABLET(40 MG) BY MOUTH AT BEDTIME Patient taking differently: Take 40 mg by mouth daily. 05/10/20  Yes Park Liter, MD  triamcinolone cream (KENALOG) 0.1 % Apply 1 application topically daily as needed (spots on legs).    Yes [provider]    Social History   Socioeconomic History   Marital status: Married    Spouse name: Maanvi Wentzell   Number of children: 2   Years of education: Not on file   Highest education level: Bachelor's degree (e.g., BA, AB, BS)  Occupational History   Occupation: retired    Comment: Med Tech  Tobacco Use   Smoking status: Never   Smokeless tobacco: Never  Vaping Use   Vaping Use: Never used  Substance and Sexual Activity   Alcohol use: No   Drug use: Never   Sexual activity: Not on file  Other Topics Concern  Not on file  Social History Narrative   Not on file   Social Determinants of Health   Financial Resource Strain: Low Risk    Difficulty of Paying Living Expenses: Not very hard  Food Insecurity: No Food Insecurity   Worried About Running Out of Food in the Last Year: Never true   Ran Out of Food in the Last Year: Never true  Transportation Needs: No Transportation Needs   Lack of Transportation (Medical): No   Lack of Transportation (Non-Medical): No  Physical Activity: Not on file  Stress: Not on file  Social Connections: Not on file  Intimate Partner Violence: Not on file     Family History  Problem Relation Age of Onset   Breast cancer Mother    Hypertension Mother    Hypertension Father    Pancreatic cancer Other        uncle    ROS: Otherwise negative unless  mentioned in HPI  Physical Examination  Vitals:   01/06/21 0749 01/06/21 1110  BP: (!) 141/51 (!) 146/51  Pulse: 64 72  Resp:    Temp: 98.8 F (37.1 C)   SpO2: 99% 98%   Body mass index is 25.85 kg/m.  General:  WDWN in NAD Gait: Not observed HENT: WNL, normocephalic Pulmonary: normal non-labored breathing, without Rales, rhonchi,  wheezing Cardiac: irregular Abdomen:  soft, NT/ND, no masses Skin: without rashes Vascular Exam/Pulses: symmetrical radial pulses Extremities: without ischemic changes, without Gangrene , without cellulitis; without open wounds;  Musculoskeletal: no muscle wasting or atrophy  Neurologic: A&O X 3;  No focal weakness or paresthesias are detected; speech is fluent/normal Psychiatric:  The pt has Normal affect. Lymph:  Unremarkable  CBC    Component Value Date/Time   WBC 7.9 01/06/2021 0335   RBC 3.09 (L) 01/06/2021 0335   HGB 8.5 (L) 01/06/2021 0335   HGB 12.5 08/19/2019 1111   HCT 26.2 (L) 01/06/2021 0335   HCT 39.4 08/19/2019 1111   PLT 223 01/06/2021 0335   PLT 299 08/19/2019 1111   MCV 84.8 01/06/2021 0335   MCV 86 08/19/2019 1111   MCH 27.5 01/06/2021 0335   MCHC 32.4 01/06/2021 0335   RDW 16.3 (H) 01/06/2021 0335   RDW 12.7 08/19/2019 1111   LYMPHSABS 1.3 11/26/2020 1226   MONOABS 0.5 11/26/2020 1226   EOSABS 0.1 11/26/2020 1226   BASOSABS 0.0 11/26/2020 1226    BMET    Component Value Date/Time   NA 134 (L) 01/06/2021 0335   NA 141 09/03/2019 1600   K 3.6 01/06/2021 0335   CL 98 01/06/2021 0335   CO2 28 01/06/2021 0335   GLUCOSE 121 (H) 01/06/2021 0335   BUN 18 01/06/2021 0335   BUN 24 09/03/2019 1600   CREATININE 2.90 (H) 01/06/2021 0335   CALCIUM 8.0 (L) 01/06/2021 0335   GFRNONAA 17 (L) 01/06/2021 0335   GFRAA 59 (L) 09/03/2019 1600    COAGS: No results found for: INR, PROTIME   Non-Invasive Vascular Imaging:   Vein mapping pending    ASSESSMENT/PLAN: This is a 70 y.o. female now with ESRD on  hemodialysis  -Patient is currently dialyzing via temporary right common femoral vein dialysis catheter -Vein mapping is pending however we will plan to create a fistula in her nondominant arm which is her left arm if possible -Please continue to hold Eliquis in preparation for dialysis access surgery likely on Monday 9/26.  We will also be able to place a tunneled dialysis catheter if patient still  has a temporary line at that time. -On-call vascular surgeon Dr. Stanford Breed evaluated the patient with me today and will provide further treatment details   Dagoberto Ligas PA-C [facility] [phone]  VASCULAR STAFF ADDENDUM: I have independently interviewed and examined the patient. I agree with the above.  Plan left AVF vs. AVG Monday 01/10/21. Can place [facility] at that time. Will check in on Sunday to get her ready for surgery.  Yevonne Aline. Stanford Breed, MD [facility] Phone Number: [phone] 01/06/2021 12:53 PM

## 2021-01-06 NOTE — Transfer of Care (Signed)
Immediate Anesthesia Transfer of Care Note  Patient: Molly [name]  Procedure(s) Performed: INTRAMEDULLARY (IM) NAIL INTERTROCHANTRIC (Left: Hip)  Patient Location: PACU  Anesthesia Type:MAC and Spinal  Level of Consciousness: oriented, drowsy and patient cooperative  Airway & Oxygen Therapy: Patient Spontanous Breathing and Patient connected to face mask oxygen  Post-op Assessment: Report given to RN and Post -op Vital signs reviewed and stable  Post vital signs: Reviewed  Last Vitals:  Vitals Value Taken Time  BP 127/56 01/06/21 1530  Temp 36.3 C 01/06/21 1530  Pulse 68 01/06/21 1535  Resp 18 01/06/21 1535  SpO2 100 % 01/06/21 1535  Vitals shown include unvalidated device data.  Last Pain:  Vitals:   01/06/21 1530  TempSrc:   PainSc: Asleep      Patients Stated Pain Goal: 1 (28/11/88 6773)  Complications: No notable events documented.

## 2021-01-06 NOTE — Progress Notes (Signed)
Offered G2 to pt, however pt wanted apple juice instead.

## 2021-01-06 NOTE — Anesthesia Preprocedure Evaluation (Addendum)
Anesthesia Evaluation  Patient identified by MRN, date of birth, ID band Patient awake    Reviewed: Allergy & Precautions, H&P , NPO status , Patient's Chart, lab work & pertinent test results, reviewed documented beta blocker date and time   Airway Mallampati: II  TM Distance: >3 FB Neck ROM: Full    Dental  (+) Missing, Dental Advisory Given, Poor Dentition   Pulmonary pneumonia, resolved,    Pulmonary exam normal breath sounds clear to auscultation       Cardiovascular hypertension, Pt. on medications + CAD, + Peripheral Vascular Disease and +CHF  + dysrhythmias Atrial Fibrillation  Rhythm:regular Rate:Normal  Claudication  EKG 12/28/20  Atrial fibrillation with RVR, rate currently controlled  Echo 12/28/20 1. Compared with the echo [id number], systolic function is reduced. Left ventricular ejection fraction, by estimation, is 40 to 45%. The left ventricle has mildly decreased function. The left ventricle demonstrates  global hypokinesis. Left ventricular diastolic parameters are indeterminate.  2. Right ventricular systolic function is normal. The right ventricular size is normal. There is moderately elevated pulmonary artery systolic pressure.  3. The pericardial effusion is circumferential.  4. The mitral valve is normal in structure. Mild mitral valve  regurgitation. No evidence of mitral stenosis.  5. The aortic valve is tricuspid. There is mild calcification of the aortic valve. There is mild thickening of the aortic valve. Aortic valve regurgitation is not visualized. No aortic stenosis is present.  6. The inferior vena cava is dilated in size with <50% respiratory variability, suggesting right atrial pressure of 15 mmHg.   Bilateral Carotid artery stenosis   Neuro/Psych Peripheral neuropathy  Neuromuscular disease negative psych ROS   GI/Hepatic Neg liver ROS, GERD  Medicated and Controlled,  Endo/Other  diabetes,  Poorly Controlled, Type 2, Insulin DependentHyperlipidemia  Renal/GU Dialysis and CRFRenal diseaseLast dialysis yesterday  negative genitourinary   Musculoskeletal  (+) Arthritis , Intertrochanteric left hip Fx   Abdominal   Peds  Hematology  (+) Blood dyscrasia, anemia , Heparin last dose yesterday 5,000 u SQ   Anesthesia Other Findings   Reproductive/Obstetrics                            Anesthesia Physical Anesthesia Plan  ASA: 4  Anesthesia Plan: Spinal   Post-op Pain Management:    Induction: Intravenous  PONV Risk Score and Plan: 3 and Ondansetron, Treatment may vary due to age or medical condition and Propofol infusion  Airway Management Planned: Natural Airway and Simple Face Mask  Additional Equipment:   Intra-op Plan:   Post-operative Plan:   Informed Consent: I have reviewed the patients History and Physical, chart, labs and discussed the procedure including the risks, benefits and alternatives for the proposed anesthesia with the patient or authorized representative who has indicated his/her understanding and acceptance.     Dental advisory given  Plan Discussed with: CRNA, Anesthesiologist and Surgeon  Anesthesia Plan Comments:        Anesthesia Quick Evaluation

## 2021-01-07 ENCOUNTER — Inpatient Hospital Stay (HOSPITAL_COMMUNITY): Payer: Medicare Other

## 2021-01-07 ENCOUNTER — Encounter (HOSPITAL_COMMUNITY): Payer: Medicare Other

## 2021-01-07 DIAGNOSIS — N186 End stage renal disease: Secondary | ICD-10-CM

## 2021-01-07 DIAGNOSIS — J9622 Acute and chronic respiratory failure with hypercapnia: Secondary | ICD-10-CM | POA: Diagnosis not present

## 2021-01-07 DIAGNOSIS — J9621 Acute and chronic respiratory failure with hypoxia: Secondary | ICD-10-CM | POA: Diagnosis not present

## 2021-01-07 LAB — GLUCOSE, CAPILLARY
Glucose-Capillary: 122 mg/dL — ABNORMAL HIGH (ref 70–99)
Glucose-Capillary: 137 mg/dL — ABNORMAL HIGH (ref 70–99)
Glucose-Capillary: 127 mg/dL — ABNORMAL HIGH (ref 70–99)
Glucose-Capillary: 123 mg/dL — ABNORMAL HIGH (ref 70–99)

## 2021-01-07 LAB — CBC
HCT: 30.9 % — ABNORMAL LOW (ref 36.0–46.0)
Hemoglobin: 9.4 g/dL — ABNORMAL LOW (ref 12.0–15.0)
MCH: 26.6 pg (ref 26.0–34.0)
MCHC: 30.4 g/dL (ref 30.0–36.0)
MCV: 87.5 fL (ref 80.0–100.0)
Platelets: 260 10*3/uL (ref 150–400)
RBC: 3.53 MIL/uL — ABNORMAL LOW (ref 3.87–5.11)
RDW: 16.6 % — ABNORMAL HIGH (ref 11.5–15.5)
WBC: 9.3 10*3/uL (ref 4.0–10.5)
nRBC: 0 % (ref 0.0–0.2)

## 2021-01-07 LAB — RENAL FUNCTION PANEL
Albumin: 2.5 g/dL — ABNORMAL LOW (ref 3.5–5.0)
Anion gap: 10 (ref 5–15)
BUN: 26 mg/dL — ABNORMAL HIGH (ref 8–23)
CO2: 25 mmol/L (ref 22–32)
Calcium: 8.3 mg/dL — ABNORMAL LOW (ref 8.9–10.3)
Chloride: 101 mmol/L (ref 98–111)
Creatinine, Ser: 3.77 mg/dL — ABNORMAL HIGH (ref 0.44–1.00)
GFR, Estimated: 12 mL/min — ABNORMAL LOW (ref >60)
Glucose, Bld: 132 mg/dL — ABNORMAL HIGH (ref 70–99)
Phosphorus: 4 mg/dL (ref 2.5–4.6)
Potassium: 3.8 mmol/L (ref 3.5–5.1)
Sodium: 136 mmol/L (ref 135–145)

## 2021-01-07 LAB — HEPARIN LEVEL (UNFRACTIONATED): Heparin Unfractionated: 0.88 IU/mL — ABNORMAL HIGH (ref 0.30–0.70)

## 2021-01-07 MED ORDER — APIXABAN 5 MG PO TABS: 5.0000 mg | ORAL_TABLET | Freq: Two times a day (BID) | ORAL | Status: DC

## 2021-01-07 MED ORDER — HEPARIN (PORCINE) 25000 UT/250ML-% IV SOLN
600.0000 [IU]/h | INTRAVENOUS | Status: DC
  Administered 2021-01-07: 950 [IU]/h via INTRAVENOUS
  Filled 2021-01-07: qty 250

## 2021-01-07 NOTE — Progress Notes (Signed)
Inpatient Rehab Admissions Coordinator:   Pt was screened for CIR by Shann Medal, PT, DPT.  At this time, note PT eval limited 2/2 femoral catheter.  Will rescreen after next therapy session.   Shann Medal, PT, DPT Admissions Coordinator [phone] 01/07/21  5:02 PM

## 2021-01-07 NOTE — Progress Notes (Addendum)
Physical Therapy Evaluation Patient Details Name: Molly [name] MRN: [id number] DOB: Aug 18, 1950 Today's Date: 01/07/2021  History of Present Illness  This is a 70 y.o. female admitted 9/12 with SOB and chest pain.  HTN crisis and afib with RVR.  Pt had fall while in hospital resulting in left hip fx 9/14. Was placed on bedrest. Pt found to have obstructive uropathy and had a ureteral stent placed9/15.  Hemodialysis initiated 9/17 due to uremia with pt with right femoral temporary HD cath in place.  Pt had fall while in hospital resulting in left hip fx and underwent IM nail left hip 9/22.  PMH: HTN, CHF, diabetes mellitus type 2,  afib, and CKD  Clinical Impression  Pt admitted with above diagnosis. Noted pt with right femoral temporary HD cath therefore messaged MD regarding limited evaluation due to this. MD states he will get the cath d/c'd after dialysis today.  Performed bed level eval today and will proceed with EOB/OOB once the catheter is removed.  Pt able to perform left LE exercises with pain present.  Pt may need REhab to progress to level that husband can assist her prior to d/c home.  Will follow acutely.  Pt currently with functional limitations due to the deficits listed below (see PT Problem List). Pt will benefit from skilled PT to increase their independence and safety with mobility to allow discharge to the venue listed below.          Recommendations for follow up therapy are one component of a multi-disciplinary discharge planning process, led by the attending physician.  Recommendations may be updated based on patient status, additional functional criteria and insurance authorization.  Follow Up Recommendations CIR;Supervision/Assistance - 24 hour    Equipment Recommendations  None recommended by PT    Recommendations for Other Services       Precautions / Restrictions Precautions Precautions: Fall Restrictions LLE Weight Bearing: Weight bearing as tolerated       Mobility  Bed Mobility Overal bed mobility: Needs Assistance Bed Mobility: Rolling Rolling: Total assist;+2 for physical assistance         General bed mobility comments: Too painful for pt to roll.  could not get pt to eOB due to right femoral HD cath.    Transfers                 General transfer comment: TBA  Ambulation/Gait             General Gait Details: TBA  Stairs            Wheelchair Mobility    Modified Rankin (Stroke Patients Only)       Balance                                             Pertinent Vitals/Pain Pain Assessment: Faces Faces Pain Scale: Hurts whole lot Pain Location: left hip Pain Descriptors / Indicators: Discomfort;Grimacing;Guarding Pain Intervention(s): Limited activity within patient's tolerance;Monitored during session;Repositioned;Patient requesting pain meds-RN notified    Home Living Family/patient expects to be discharged to:: Private residence Living Arrangements: Spouse/significant other Available Help at Discharge: Family;Available 24 hours/day Type of Home: House Home Access: Stairs to enter Entrance Stairs-Rails: Right;Left;Can reach both Entrance Stairs-Number of Steps: 2 Home Layout: One level Home Equipment: Walker - 2 wheels;Cane - single point;Walker - 4 wheels;Bedside commode;Shower seat;Grab bars - tub/shower;Grab bars -  toilet;Hand held shower head;Wheelchair - manual Additional Comments: husband states they are building ramp currently.    Prior Function Level of Independence: Needs assistance   Gait / Transfers Assistance Needed: reports ambulatory without AD though will occasionally  hold to husband's arm out in community  ADL's / Homemaking Assistance Needed: Reports able to complete basic ADLs but husband nearby to assist as needed. Husband has been completing IADLs, driving and grocery shopping. 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, tutors adults in free time  Comments: Falls  MAy to August per husband     Hand Dominance   Dominant Hand: Right    Extremity/Trunk Assessment   Upper Extremity Assessment Upper Extremity Assessment: Defer to OT evaluation    Lower Extremity Assessment Lower Extremity Assessment: LLE deficits/detail LLE: Unable to fully assess due to pain       Communication   Communication: No difficulties  Cognition Arousal/Alertness: Awake/alert Behavior During Therapy: Flat affect Overall Cognitive Status: Within Functional Limits for tasks assessed                                 General Comments: Pt at times appears confused.  Talking about things unrelated to conversation      General Comments General comments (skin integrity, edema, etc.): 2LO2    Exercises General Exercises - Lower Extremity Ankle Circles/Pumps: AROM;Both;10 reps;Supine Quad Sets: AROM;Both;10 reps;Supine Heel Slides: AAROM;Left;Supine;5 reps Hip ABduction/ADduction: AAROM;Left;5 reps;Supine   Assessment/Plan    PT Assessment Patient needs continued PT services  PT Problem List Decreased activity tolerance;Decreased balance;Decreased mobility;Decreased knowledge of use of DME;Decreased range of motion;Decreased strength;Decreased safety awareness;Decreased knowledge of precautions;Pain       PT Treatment Interventions DME instruction;Gait training;Functional mobility training;Therapeutic activities;Therapeutic exercise;Balance training;Patient/family education    PT Goals (Current goals can be found in the Care Plan section)  Acute Rehab PT Goals Patient Stated Goal: to go home PT Goal Formulation: With patient/family Time For Goal Achievement: 01/21/21 Potential to Achieve Goals: Fair    Frequency Min 6X/week   Barriers to discharge        Co-evaluation               AM-PAC PT "6 Clicks" Mobility  Outcome Measure Help needed turning from your back to your side while in a flat bed without using bedrails?: Total Help  needed moving from lying on your back to sitting on the side of a flat bed without using bedrails?: Total Help needed moving to and from a bed to a chair (including a wheelchair)?: Total Help needed standing up from a chair using your arms (e.g., wheelchair or bedside chair)?: Total Help needed to walk in hospital room?: Total Help needed climbing 3-5 steps with a railing? : Total 6 Click Score: 6    End of Session Equipment Utilized During Treatment: Gait belt;Oxygen Activity Tolerance: Patient limited by fatigue;Patient limited by pain (limited by right temporary HD cath) Patient left: with call bell/phone within reach;in bed;with bed alarm set;with family/visitor present Nurse Communication: Mobility status PT Visit Diagnosis: Muscle weakness (generalized) (M62.81);Pain Pain - Right/Left: Left Pain - part of body: Hip    Time: [id number] PT Time Calculation (min) (ACUTE ONLY): 36 min   Charges:   PT Evaluation $PT Eval Moderate Complexity: 1 Mod PT Treatments $Therapeutic Exercise: 8-22 mins        Molly [name] M,PT Acute Rehab Services [phone] (pager)   Molly [name] 01/07/2021, 3:45 PM

## 2021-01-07 NOTE — Progress Notes (Addendum)
Cardiology Progress Note  Patient ID: Molly [name] MRN: [id number] DOB: 11/23/1950 Date of Encounter: 01/07/2021  Primary Cardiologist: Jenne Campus, MD  Subjective   Chief Complaint: Weakness/Fatigue  HPI: Hip surgery yesterday.  Reports pain and fatigue from this.  Maintaining sinus rhythm and doing well.  ROS:  All other ROS reviewed and negative. Pertinent positives noted in the HPI.     Inpatient Medications  Scheduled Meds:  amiodarone  200 mg Oral Daily   Chlorhexidine Gluconate Cloth  6 each Topical Daily   Chlorhexidine Gluconate Cloth  6 each Topical Q0600   cilostazol  50 mg Oral BID   docusate sodium  100 mg Oral BID   ezetimibe  10 mg Oral Daily   famotidine  20 mg Oral Daily   feeding supplement (NEPRO CARB STEADY)  237 mL Oral BID BM   heparin injection (subcutaneous)  5,000 Units Subcutaneous Q8H   hydrALAZINE  25 mg Oral Q8H   insulin aspart  0-6 Units Subcutaneous TID WC   isosorbide mononitrate  30 mg Oral Daily   lidocaine  1 patch Transdermal Q24H   metoprolol succinate  50 mg Oral Daily   multivitamin  1 tablet Oral QHS   pantoprazole  40 mg Oral QHS   rosuvastatin  10 mg Oral Daily   sodium chloride flush  3 mL Intravenous Q12H   Continuous Infusions:  sodium chloride     sodium chloride     sodium chloride     sodium chloride 10 mL/hr at 01/06/21 1409   PRN Meds: sodium chloride, sodium chloride, sodium chloride, acetaminophen, alteplase, heparin, hydrALAZINE, hydrocortisone cream, HYDROmorphone (DILAUDID) injection, lidocaine (PF), lidocaine-prilocaine, loperamide, loratadine, menthol-cetylpyridinium **OR** phenol, metoCLOPramide **OR** metoCLOPramide (REGLAN) injection, Muscle Rub, naLOXone (NARCAN)  injection, pentafluoroprop-tetrafluoroeth, sodium chloride, sodium chloride flush   Vital Signs   Vitals:   01/07/21 0000 01/07/21 0400 01/07/21 0500 01/07/21 0742  BP: (!) 133/48 (!) 131/49  (!) 127/47  Pulse: 63 (!) 58  63  Resp:    17   Temp: 98.8 F (37.1 C) (!) 97.3 F (36.3 C)  (!) 97.4 F (36.3 C)  TempSrc: Oral Tympanic  Axillary  SpO2: 99% 98%  99%  Weight:   69.1 kg   Height:        Intake/Output Summary (Last 24 hours) at 01/07/2021 1100 Last data filed at 01/07/2021 0307 Gross per 24 hour  Intake 700 ml  Output 50 ml  Net 650 ml   Last 3 Weights 01/07/2021 01/06/2021 01/06/2021  Weight (lbs) 152 lb 5.4 oz 141 lb 5 oz 141 lb 5 oz  Weight (kg) 69.1 kg 64.1 kg 64.1 kg      Telemetry  Overnight telemetry shows SR 70s, which I personally reviewed.   Physical Exam   Vitals:   01/07/21 0000 01/07/21 0400 01/07/21 0500 01/07/21 0742  BP: (!) 133/48 (!) 131/49  (!) 127/47  Pulse: 63 (!) 58  63  Resp:    17  Temp: 98.8 F (37.1 C) (!) 97.3 F (36.3 C)  (!) 97.4 F (36.3 C)  TempSrc: Oral Tympanic  Axillary  SpO2: 99% 98%  99%  Weight:   69.1 kg   Height:        Intake/Output Summary (Last 24 hours) at 01/07/2021 1100 Last data filed at 01/07/2021 0307 Gross per 24 hour  Intake 700 ml  Output 50 ml  Net 650 ml    Last 3 Weights 01/07/2021 01/06/2021 01/06/2021  Weight (lbs) 152 lb 5.4  oz 141 lb 5 oz 141 lb 5 oz  Weight (kg) 69.1 kg 64.1 kg 64.1 kg    Body mass index is 27.86 kg/m.   General: Well nourished, well developed, in no acute distress Head: Atraumatic, normal size  Eyes: PEERLA, EOMI  Neck: Supple, no JVD Endocrine: No thryomegaly Cardiac: Normal S1, S2; RRR; no murmurs, rubs, or gallops Lungs: Clear to auscultation bilaterally, no wheezing, rhonchi or rales  Abd: Soft, nontender, no hepatomegaly  Ext: No edema, pulses 2+ Musculoskeletal: No deformities, BUE and BLE strength normal and equal Skin: Warm and dry, no rashes   Neuro: Alert and oriented to person, place, time, and situation, CNII-XII grossly intact, no focal deficits  Psych: Normal mood and affect   Labs  High Sensitivity Troponin:   Recent Labs  Lab 12/27/20 0640 12/27/20 0835  TROPONINIHS 397* 376*     Cardiac  EnzymesNo results for input(s): TROPONINI in the last 168 hours. No results for input(s): TROPIPOC in the last 168 hours.  Chemistry Recent Labs  Lab 01/03/21 0803 01/03/21 2053 01/05/21 0500 01/06/21 0335 01/07/21 0229  NA 134*   < > 133* 134* 136  K 3.6   < > 3.6 3.6 3.8  CL 93*   < > 96* 98 101  CO2 24   < > '26 28 25  '$ GLUCOSE 110*   < > 122* 121* 132*  BUN 48*   < > 34* 18 26*  CREATININE 4.89*   < > 4.01* 2.90* 3.77*  CALCIUM 8.6*   < > 8.2* 8.0* 8.3*  PROT 6.7  --  6.1*  --   --   ALBUMIN 2.7*   < > 2.4* 2.3* 2.5*  AST 114*  --  86*  --   --   ALT 100*  --  79*  --   --   ALKPHOS 93  --  91  --   --   BILITOT 0.7  --  0.6  --   --   GFRNONAA 9*   < > 12* 17* 12*  ANIONGAP 17*   < > '11 8 10   '$ < > = values in this interval not displayed.    Hematology Recent Labs  Lab 01/05/21 0500 01/06/21 0335 01/07/21 0229  WBC 8.1 7.9 9.3  RBC 3.05* 3.09* 3.53*  HGB 8.3* 8.5* 9.4*  HCT 26.0* 26.2* 30.9*  MCV 85.2 84.8 87.5  MCH 27.2 27.5 26.6  MCHC 31.9 32.4 30.4  RDW 16.2* 16.3* 16.6*  PLT 269 223 260   BNPNo results for input(s): BNP, PROBNP in the last 168 hours.  DDimer No results for input(s): DDIMER in the last 168 hours.   Radiology  DG C-Arm 1-60 Min-No Report  Result Date: 01/06/2021 Fluoroscopy was utilized by the requesting physician.  No radiographic interpretation.   DG FEMUR MIN 2 VIEWS LEFT  Result Date: 01/06/2021 CLINICAL DATA:  IM nail left femur. EXAM: LEFT FEMUR 2 VIEWS COMPARISON:  Preoperative imaging. FINDINGS: Five fluoroscopic spot views of the left femur obtained in the operating room. Intramedullary nail with distal locking and trans trochanteric screw fixation of proximal femur fracture. Fluoroscopy time 49 seconds. IMPRESSION: Intraoperative fluoroscopy during ORIF of left femur fracture. Electronically Signed   By: Keith Rake M.D.   On: 01/06/2021 15:59    Cardiac Studies  TTE 12/28/2020  1. Compared with the echo A999333, systolic  function is reduced. Left  ventricular ejection fraction, by estimation, is 40 to 45%. The left  ventricle has mildly decreased function. The left ventricle demonstrates  global hypokinesis. Left ventricular  diastolic parameters are indeterminate.   2. Right ventricular systolic function is normal. The right ventricular  size is normal. There is moderately elevated pulmonary artery systolic  pressure.   3. The pericardial effusion is circumferential.   4. The mitral valve is normal in structure. Mild mitral valve  regurgitation. No evidence of mitral stenosis.   5. The aortic valve is tricuspid. There is mild calcification of the  aortic valve. There is mild thickening of the aortic valve. Aortic valve  regurgitation is not visualized. No aortic stenosis is present.   6. The inferior vena cava is dilated in size with <50% respiratory  variability, suggesting right atrial pressure of 15 mmHg.   Patient Profile  70 year old female with history of CKD stage IV, diastolic heart failure, PAD (bilateral SFA CTO's), left renal artery stenosis (90%), diabetes, CAD who was admitted on 12/27/2020 with chest pain and acute hypoxic respiratory failure secondary to pulmonary edema from likely hypertensive crisis.  Course complicated by A. fib with RVR 12/28/2020.  She is also developed AKI requiring hemodialysis.  She did suffer a fall this admission that resulted in a left hip fracture.  This has been repaired.  Course also complicated by anemia which has been evaluated by GI with no evidence of active bleeding.  Assessment & Plan   #A. fib with RVR -Had difficult to control rates.  Was started on amiodarone and has converted to sinus rhythm.  She was in A. fib for less than 12 hours. -She is maintaining sinus rhythm on amiodarone.  We will complete 1 month of therapy for this.  I believe her atrial fibrillation was a secondary process in the setting of critical illness.  I have ordered an oral amiodarone  load for 7 days and then she will complete 21 days of 200 mg daily.  Would recommend this not be long-term for her. -Eliquis and anticoagulation were held in the setting of bleed with concerns for gastritis as well as nosebleeds.  She also suffered a fall and underwent left hip surgery. -No evidence of bleeding on EGD however there was gastritis which is being treated. -She has completed hip surgery. -Discussed with orthopedics starting Eliquis 5 mg twice daily.  They are okay to do this.  However, it appears that she will have dialysis access placed on 9/26.  Vascular surgery has recommended to hold her Eliquis until that time.  We will start her on heparin drip. -Would recommend start Eliquis after she has had vascular access placement by vascular surgery.  Heparin drip until that time.  #Acute blood loss anemia #Gastritis #Nosebleeds -Had drop in hemoglobin this admission.  EGD without evidence of bleeding.  Gastritis was seen on CT scan.  She did have nosebleeds. -She has been cleared to restart anticoagulation but appears that she will undergo dialysis access placement on 01/10/2021.  They have requested to hold Eliquis.  We will start heparin drip in its place.  #Acute hypoxic respiratory failure #CKD stage IV with AKI now on hemodialysis #Recurrent flash pulmonary edema #Systolic heart failure [id number] #Volume overload/obstructive uropathy #Left renal artery stenosis -She was initially admitted with flash pulmonary edema which has been a recurrent problem and hypertensive crisis.  She has a known left renal artery stenosis 80 to 90%. -Her course has been complicated by AKI now on hemodialysis.  She is also been diagnosed with systolic heart failure which is new.  I believe her systolic heart failure is likely arrhythmia related in the setting of rapid A. fib. -Currently volume is being managed by dialysis.  Renal believes this may be permanent. -I do believe her left renal artery stenosis  could explain her recurrent episodes of flash pulmonary edema.  I think she should be evaluated for stenting in the outpatient setting.  We did consider this during her admission however given her declining kidney function now requiring hemodialysis as well as hip fracture and acute blood loss anemia this was deferred.  She can discuss this with her primary cardiologist.  I did discuss this with Dr. Midge Minium and Quay Burow this admission and they believe it is worth the procedure. -For her systolic heart failure we have her on metoprolol succinate 50 mg daily, hydralazine 25 mg 3 times daily, Imdur 30 mg daily.  Volume status is maintained by hemodialysis.  She is not a candidate for ACE/ARB/Arni/MRA given CKD stage IV now on dialysis. -We have pursued a rhythm control strategy with A. fib with amiodarone and she will continue this. -Would consider an outpatient ischemic evaluation.  However given her very prolonged admission I would not delay her hospital stay for this.  She is now recovering from hip surgery and been started on hemodialysis.  I think she just needs to recover.  #Elevated troponin -Troponins were elevated on arrival.  EKG was nonischemic.  This is demand.  No concerns for ACS.  No chest pain reported.  #Left hip fracture -She suffered a fall this admission.  She fell in the bathroom.  Suffered a left hip fracture.  Status postrepair on 01/06/2021.  Doing well.    [facility] will sign off.   Medication Recommendations: Medical recommendations as above. Other recommendations (labs, testing, etc): None. Follow up as an outpatient: Please notify us when she is closer to discharge.  She has no other active cardiac issues.  She is stable and medications are appropriate.  We will arrange follow-up with Dr. Agustin Cree when she is closer to discharge.  Again her consideration for left renal artery stenting will be done as an outpatient.  Cardiology will be available as needed for  the rest of her hospital stay.  Please notify us if any issues become active.  For questions or updates, please contact [place] Please consult [email or web address] for contact info under   Time Spent with Patient: I have spent a total of 35 minutes with patient reviewing hospital notes, telemetry, EKGs, labs and examining the patient as well as establishing an assessment and plan that was discussed with the patient.  > 50% of time was spent in direct patient care.    Signed, Addison Naegeli. Audie Box, MD, [place]  01/07/2021 11:00 AM

## 2021-01-07 NOTE — Progress Notes (Signed)
PROGRESS NOTE    Molly [name]  [id number] DOB: November 29, 1950 DOA: 12/27/2020 PCP: [name], MD   Brief Narrative:  70 year old female with past medical history of hypertension, left renal artery stenosis ([id number]) diastolic CHF, CKD stage IV, CAD, PAD with bilateral SFA CTO's, anemia chronic disease diabetes presented on 12/27/2020 with 4 days of chest pain.  BP was uncontrolled and spiked as high as 234/208 in the ED.  Chest x-ray consistent with pulmonary edema.  Patient briefly required BiPAP.   Admitted to [facility] service with cardiology consulted. Patient developed A. fib with RVR on 9/13.  Further extensive hospitalization course as below.  Assessment & Plan:   Principal Problem:   Acute on chronic respiratory failure with hypoxia and hypercapnia (HCC) Active Problems:   HTN (hypertension)   Type 2 diabetes mellitus with stage 3b chronic kidney disease, with long-term current use of insulin (HCC)   Elevated troponin   Acute on chronic heart failure with preserved ejection fraction (HFpEF) ([place])   CAP (community acquired pneumonia)   Anemia   Abnormal CT of the abdomen  Acute metabolic/uremic encephalopathy: Resolved, alert and oriented.  Continues to improve with dialysis.  Acute respiratory failure with hypoxia secondary to recurrent flash pulmonary edema/acute systolic congestive heart failure: Echo shows reduced ejection fraction to 40 to 45% compared to the previous 1.  Now being managed by dialysis.  Fall in hospital/left hip fracture: CT left hip indicates left hip fracture.  S/p cephalomedullary nail of the left hip fracture by Dr. Lucia Gaskins 01/06/2021.  To be seen by PT OT.  Follow-up with orthopedics in 2 weeks.  Hypertensive emergency with chest pain: No chest pain and blood pressure controlled now.  Continue amlodipine 10 mg p.o. daily, hydralazine 25 mg p.o. 3 times daily Imdur to 30 mg p.o. daily, Toprol-XL 50 mg p.o. daily.  Left renal artery stenosis: Has a  history of 80 to 90% of proximal left renal artery stenosis.  Plan was to do angiogram and stent but due to rising creatinine, this plan was kept on hold.  Cardiology and nephrology following.  Further plans unclear at this point in time now that she has been started on dialysis and per nephrology, this is likely going to be long-term.  New onset A. fib with RVR: Developed overnight 12/28/2020.  She was initially started on amiodarone drip and converted back to sinus rhythm. Not on any anticoagulation due to slight drop in hemoglobin.  However hemoglobin has remained stable since 12/30/2020.  Patient also underwent EGD 01/05/2021, no signs of bleeding.  GI cleared her for anticoagulation.  She also underwent hip surgery yesterday.  She is cleared by orthopedics for anticoagulation.  Patient is a scheduled to have AV fistula on Monday by vascular surgery.  Cardiology has started her on heparin now and will transition her to Eliquis after that procedure.  Possible gastroduodenitis: CT abdomen suggests possible gastroduodenitis.  Status post EGD 01/05/2021, no signs of gastroduodenitis.  Remains on PPI.  Epistaxis: Nosebleed overnight 12/30/2018, no further episodes.  Heparin was stopped and has remained on hold since then.  AKI on CKD stage IV/progressive renal failure/obstructive uropathy/left ureteral stone/left hydronephrosis/dross hematuria: CT renal stone shows 6 mm of left ureteral stone causing obstruction/hydronephrosis.  Patient underwent cystoscopy with left ureteral stent placement on 12/30/2020.  TDC was placed on 01/01/2021 and she received first session of dialysis and second on 01/03/2021.  Nephrology consulting with [facility] for permanent access.  They plan for AV fistula on 01/10/2021 and  conversion of the [facility] as well.  Hyponatremia: Improving.    Community-acquired pneumonia: Completed 5 days of Rocephin on 12/31/2020.  Elevated troponin/demand ischemia: In the setting of hypertensive crisis.  EKG  without ischemic changes.  Likely demand ischemia. 397 > 376.  Cardiology on board.  No plan for intervention.  Type 2 diabetes mellitus: Recent hemoglobin A1c 8.2% on 11/27/2020.  Home regimen includes Humulin 70/30 mix 8 to 12 units daily.  Due to her n.p.o. status and hypoglycemia, 70/30 was discontinued.  Blood sugar has remained controlled on SSI since then.  Dyslipidemia: Continue Zetia and Crestor.  PAD: Continue statin and Pletal.  Anemia of chronic disease: Hemoglobin has remained stable since 12/30/2020.  CT renal stone indicates possible gastritis or gastric ulcer.  GI on board.  GERD: Continue PPI.  Moderate protein calorie malnutrition: Seen by nutrition.  Remains on Prosource 3 times daily.  Abdominal tenderness: Resolved.  DVT prophylaxis:    Code Status: Full Code  Family Communication: Patient's husband present at bedside.  Plan of care discussed with him in length.    Status is: Inpatient  Remains inpatient appropriate because:Ongoing diagnostic testing needed not appropriate for outpatient work up  Dispo: The patient is from: Home              Anticipated d/c is to: Home              Patient currently is not medically stable to d/c.   Difficult to place patient No        Estimated body mass index is 27.86 kg/m as calculated from the following:   Height as of this encounter: '5\' 2"'$  (1.575 m).   Weight as of this encounter: 69.1 kg.     Nutritional Assessment: Body mass index is 27.86 kg/m.Marland Kitchen Seen by dietician.  I agree with the assessment and plan as outlined below: Nutrition Status: Nutrition Problem: Inadequate oral intake Etiology: inability to eat Signs/Symptoms: NPO status Interventions: Tube feeding Skin Assessment: I have examined the patient's skin and I agree with the wound assessment as performed by the wound care RN as outlined below:    Consultants:  Nephrology Cardiology Orthopedic Urology-signed off GI  Procedures:  As  above  Antimicrobials:  Anti-infectives (From admission, onward)    Start     Dose/Rate Route Frequency Ordered Stop   01/07/21 0230  ceFAZolin (ANCEF) IVPB 2g/100 mL premix        2 g 200 mL/hr over 30 Minutes Intravenous Every 12 hours 01/06/21 1711 01/07/21 0252   01/06/21 2100  ceFAZolin (ANCEF) IVPB 2g/100 mL premix  Status:  Discontinued        2 g 200 mL/hr over 30 Minutes Intravenous Every 6 hours 01/06/21 1649 01/06/21 1711   01/06/21 1400  ceFAZolin (ANCEF) IVPB 2g/100 mL premix        2 g 200 mL/hr over 30 Minutes Intravenous On call to O.R. 01/05/21 1926 01/06/21 1441   12/31/20 1330  cefTRIAXone (ROCEPHIN) 1 g in sodium chloride 0.9 % 100 mL IVPB        1 g 200 mL/hr over 30 Minutes Intravenous  Once 12/31/20 1315 12/31/20 1350   12/31/20 1000  cefTRIAXone (ROCEPHIN) 1 g in sodium chloride 0.9 % 100 mL IVPB  Status:  Discontinued        1 g 200 mL/hr over 30 Minutes Intravenous Every 24 hours 12/30/20 1202 12/31/20 1315   12/27/20 1530  cefTRIAXone (ROCEPHIN) 2 g in sodium chloride 0.9 %  100 mL IVPB  Status:  Discontinued        2 g 200 mL/hr over 30 Minutes Intravenous Every 24 hours 12/27/20 1435 12/30/20 1202          Subjective: Patient seen and examined with her husband the bedside patient fully alert and oriented.  She has no complaints other than the fact that she is tired of laying in the bed in similar position due to her hip fracture.   Objective: Vitals:   01/07/21 0000 01/07/21 0400 01/07/21 0500 01/07/21 0742  BP: (!) 133/48 (!) 131/49  (!) 127/47  Pulse: 63 (!) 58  63  Resp:    17  Temp: 98.8 F (37.1 C) (!) 97.3 F (36.3 C)  (!) 97.4 F (36.3 C)  TempSrc: Oral Tympanic  Axillary  SpO2: 99% 98%  99%  Weight:   69.1 kg   Height:        Intake/Output Summary (Last 24 hours) at 01/07/2021 1251 Last data filed at 01/07/2021 0307 Gross per 24 hour  Intake 700 ml  Output 50 ml  Net 650 ml    Filed Weights   01/06/21 0359 01/06/21 1243  01/07/21 0500  Weight: 64.1 kg 64.1 kg 69.1 kg    Examination:  General exam: Appears calm and comfortable  Respiratory system: Clear to auscultation. Respiratory effort normal. Cardiovascular system: S1 & S2 heard, RRR. No JVD, murmurs, rubs, gallops or clicks. No pedal edema. Gastrointestinal system: Abdomen is nondistended, soft and nontender. No organomegaly or masses felt. Normal bowel sounds heard. Central nervous system: Alert and oriented. No focal neurological deficits. Skin: No rashes, lesions or ulcers.  Psychiatry: Judgement and insight appear normal. Mood & affect appropriate.   Data Reviewed: I have personally reviewed following labs and imaging studies  CBC: Recent Labs  Lab 01/03/21 1225 01/04/21 0435 01/05/21 0500 01/06/21 0335 01/07/21 0229  WBC 6.5 6.2 8.1 7.9 9.3  HGB 8.2* 8.3* 8.3* 8.5* 9.4*  HCT 25.9* 26.8* 26.0* 26.2* 30.9*  MCV 84.4 85.4 85.2 84.8 87.5  PLT 366 240 269 223 [id number]    Basic Metabolic Panel: Recent Labs  Lab 01/03/21 0803 01/03/21 1815 01/03/21 2053 01/04/21 0435 01/04/21 1732 01/05/21 0500 01/06/21 0335 01/07/21 0229  NA 134*  --  135 133*  --  133* 134* 136  K 3.6  --  3.6 3.6  --  3.6 3.6 3.8  CL 93*  --  98 96*  --  96* 98 101  CO2 24  --  24 25  --  '26 28 25  '$ GLUCOSE 110*  --  131* 146*  --  122* 121* 132*  BUN 48*  --  26* 30*  --  34* 18 26*  CREATININE 4.89*  --  3.21* 3.39*  --  4.01* 2.90* 3.77*  CALCIUM 8.6*  --  8.0* 8.0*  --  8.2* 8.0* 8.3*  MG 2.0 1.9  --  1.9 1.9 1.9  --   --   PHOS  --   --  2.6 3.0  --  2.9 2.9 4.0    GFR: Estimated Creatinine Clearance: 13 mL/min (A) (by C-G formula based on SCr of 3.77 mg/dL (H)). Liver Function Tests: Recent Labs  Lab 01/03/21 0803 01/03/21 2053 01/04/21 0435 01/05/21 0500 01/06/21 0335 01/07/21 0229  AST 114*  --   --  86*  --   --   ALT 100*  --   --  79*  --   --   ALKPHOS  93  --   --  91  --   --   BILITOT 0.7  --   --  0.6  --   --   PROT 6.7  --   --   6.1*  --   --   ALBUMIN 2.7* 2.4* 2.4* 2.4* 2.3* 2.5*    No results for input(s): LIPASE, AMYLASE in the last 168 hours. No results for input(s): AMMONIA in the last 168 hours. Coagulation Profile: No results for input(s): INR, PROTIME in the last 168 hours. Cardiac Enzymes: No results for input(s): CKTOTAL, CKMB, CKMBINDEX, TROPONINI in the last 168 hours. BNP (last 3 results) No results for input(s): PROBNP in the last 8760 hours. HbA1C: No results for input(s): HGBA1C in the last 72 hours. CBG: Recent Labs  Lab 01/06/21 1225 01/06/21 1526 01/06/21 1653 01/07/21 0745 01/07/21 1124  GLUCAP 113* 121* 124* 122* 137*    Lipid Profile: No results for input(s): CHOL, HDL, LDLCALC, TRIG, CHOLHDL, LDLDIRECT in the last 72 hours. Thyroid Function Tests: No results for input(s): TSH, T4TOTAL, FREET4, T3FREE, THYROIDAB in the last 72 hours. Anemia Panel: No results for input(s): VITAMINB12, FOLATE, FERRITIN, TIBC, IRON, RETICCTPCT in the last 72 hours. Sepsis Labs: No results for input(s): PROCALCITON, LATICACIDVEN in the last 168 hours.   Recent Results (from the past 240 hour(s))  Surgical pcr screen     Status: None   Collection Time: 01/05/21  7:42 PM   Specimen: Nasal Mucosa; Nasal Swab  Result Value Ref Range Status   MRSA, PCR NEGATIVE NEGATIVE Final   Staphylococcus aureus NEGATIVE NEGATIVE Final    Comment: (NOTE) The Xpert SA Assay (FDA approved for NASAL specimens in patients 52 years of age and older), is one component of a comprehensive surveillance program. It is not intended to diagnose infection nor to guide or monitor treatment. Performed at [facility], [place] [address], [place], [place] [address]        Radiology Studies: DG C-Arm 1-60 Min-No Report  Result Date: 01/06/2021 Fluoroscopy was utilized by the requesting physician.  No radiographic interpretation.   DG FEMUR MIN 2 VIEWS LEFT  Result Date: 01/06/2021 CLINICAL DATA:  IM nail left  femur. EXAM: LEFT FEMUR 2 VIEWS COMPARISON:  Preoperative imaging. FINDINGS: Five fluoroscopic spot views of the left femur obtained in the operating room. Intramedullary nail with distal locking and trans trochanteric screw fixation of proximal femur fracture. Fluoroscopy time 49 seconds. IMPRESSION: Intraoperative fluoroscopy during ORIF of left femur fracture. Electronically Signed   By: Keith Rake M.D.   On: 01/06/2021 15:59    Scheduled Meds:  amiodarone  200 mg Oral Daily   Chlorhexidine Gluconate Cloth  6 each Topical Daily   Chlorhexidine Gluconate Cloth  6 each Topical Q0600   cilostazol  50 mg Oral BID   docusate sodium  100 mg Oral BID   ezetimibe  10 mg Oral Daily   famotidine  20 mg Oral Daily   feeding supplement (NEPRO CARB STEADY)  237 mL Oral BID BM   hydrALAZINE  25 mg Oral Q8H   insulin aspart  0-6 Units Subcutaneous TID WC   isosorbide mononitrate  30 mg Oral Daily   lidocaine  1 patch Transdermal Q24H   metoprolol succinate  50 mg Oral Daily   multivitamin  1 tablet Oral QHS   pantoprazole  40 mg Oral QHS   rosuvastatin  10 mg Oral Daily   sodium chloride flush  3 mL Intravenous Q12H  Continuous Infusions:  sodium chloride     sodium chloride     sodium chloride     sodium chloride 10 mL/hr at 01/06/21 1409   heparin       LOS: 11 days   Time spent: 28 minutes   Darliss Cheney, MD [facility]  01/07/2021, 12:51 PM  Please page via [facility] and do not message via secure chat for anything urgent. Secure chat can be used for anything non urgent and I will respond at my earliest availability.  How to contact the [facility] Attending or Consulting provider [place] or covering provider during after hours [place], for this patient?  Check the care team in [facility] and look for a) attending/consulting [facility] provider listed and b) the [facility] team listed. Page or secure chat [phone]. Log into [email or web address] and use [facility] universal password to access. If you do not have  the password, please contact the hospital operator. Locate the [facility] provider you are looking for under [facility] and page to a number that you can be directly reached. If you still have difficulty reaching the provider, please page the [facility] (Director on Call) for the Hospitalists listed on amion for assistance.

## 2021-01-07 NOTE — Progress Notes (Signed)
ANTICOAGULATION CONSULT NOTE  Pharmacy Consult for Heparin Indication: atrial fibrillation  Allergies  Allergen Reactions   Lisinopril Other (See Comments)   Mercury Nausea And Vomiting    Patient Measurements: Height: '5\' 2"'$  (157.5 cm) Weight: 69.1 kg (152 lb 5.4 oz) IBW/kg (Calculated) : 50.1  Vital Signs: Temp: 97.8 F (36.6 C) (09/23 1954) Temp Source: Axillary (09/23 1954) BP: 127/43 (09/23 1954) Pulse Rate: 70 (09/23 1954)  Labs: Recent Labs    01/05/21 0500 01/06/21 0335 01/07/21 0229 01/07/21 2200  HGB 8.3* 8.5* 9.4*  --   HCT 26.0* 26.2* 30.9*  --   PLT 269 223 260  --   HEPARINUNFRC  --   --   --  0.88*  CREATININE 4.01* 2.90* 3.77*  --      Estimated Creatinine Clearance: 13 mL/min (A) (by C-G formula based on SCr of 3.77 mg/dL (H)).   Assessment: 70 y.o. female with new onset Afib started on IV heparin earlier this admit.  Patient had a nosebleed on 9/14 and therapeutic heparin was held since.  GI cleared patient to resume anticoagulation post EGD on 9/21 and she is also s/p IM nail L hip on 9/22.  Patient needs an AV fistula tentatively scheduled for 9/26, so Cards asked pharmacy to resume IV heparin.  -heparin level= 0.88 on 950 units/hr  Goal of Therapy:  Heparin level 0.3-0.5 units/ml Monitor platelets by anticoagulation protocol: Yes   Plan:  -Decrease heparin to 750 units/hr -Heparin level in 6 hours and daily wth CBC daily  Hildred Laser, PharmD Clinical Pharmacist **Pharmacist phone directory can now be found on [email or web address] ([email or web address]).  Listed under [place].

## 2021-01-07 NOTE — Progress Notes (Signed)
[facility] ROUNDING NOTE   Subjective:   Interval History: 70 year old lady with history of hypertension left renal artery stenosis [id number] diastolic dysfunction chronic kidney disease stage IV peripheral artery disease bilateral  superficial femoral artery obstruction.  Anemia, diabetes.  Presented 12/27/2020 with chest pain.  Was also found to have uncontrolled hypertension.  Blood pressure 1 6556 pulse 70 temperature 98.6 O2 sats 98% room air.  Last dialysis 9/21 with 2 L  next dialysis will be 01/07/2021.  Will need conversion to permanent dialysis access.  We will consult vein vascular surgery.  Sodium 134 potassium 3.6 chloride 98 CO2 28 BUN 18 creatinine 2.9 glucose 121 calcium 8 phosphorus 2.9 albumin 2.3 hemoglobin 8.5   minimal urine output  Objective:  Vital signs in last 24 hours:  Temp:  [97.3 F (36.3 C)-98.9 F (37.2 C)] 97.4 F (36.3 C) (09/23 0742) Pulse Rate:  [58-73] 63 (09/23 0742) Resp:  [13-17] 17 (09/23 0742) BP: (127-167)/(47-69) 127/47 (09/23 0742) SpO2:  [96 %-100 %] 99 % (09/23 0742) Weight:  [64.1 kg-69.1 kg] 69.1 kg (09/23 0500)  Weight change: -2.3 kg Filed Weights   01/06/21 0359 01/06/21 1243 01/07/21 0500  Weight: 64.1 kg 64.1 kg 69.1 kg    Intake/Output: I/O last 3 completed shifts: In: 700 [I.V.:400; IV Piggyback:300] Out: 50 [Blood:50]   Intake/Output this shift:  No intake/output data recorded.  Gen: in distress, yelling, laying flat in bed CVS:RRR, no murmurs Resp:crackles bibasilar, normal WOB [id number], nontender Ext:no edema Neuro: not orientable, yelling, moving all ext spontaneously   Basic Metabolic Panel: Recent Labs  Lab 01/03/21 0803 01/03/21 1815 01/03/21 2053 01/04/21 0435 01/04/21 1732 01/05/21 0500 01/06/21 0335 01/07/21 0229  NA 134*  --  135 133*  --  133* 134* 136  K 3.6  --  3.6 3.6  --  3.6 3.6 3.8  CL 93*  --  98 96*  --  96* 98 101  CO2 24  --  24 25  --  '26 28 25  '$ GLUCOSE 110*  --  131*  146*  --  122* 121* 132*  BUN 48*  --  26* 30*  --  34* 18 26*  CREATININE 4.89*  --  3.21* 3.39*  --  4.01* 2.90* 3.77*  CALCIUM 8.6*  --  8.0* 8.0*  --  8.2* 8.0* 8.3*  MG 2.0 1.9  --  1.9 1.9 1.9  --   --   PHOS  --   --  2.6 3.0  --  2.9 2.9 4.0     Liver Function Tests: Recent Labs  Lab 01/03/21 0803 01/03/21 2053 01/04/21 0435 01/05/21 0500 01/06/21 0335 01/07/21 0229  AST 114*  --   --  86*  --   --   ALT 100*  --   --  79*  --   --   ALKPHOS 93  --   --  91  --   --   BILITOT 0.7  --   --  0.6  --   --   PROT 6.7  --   --  6.1*  --   --   ALBUMIN 2.7* 2.4* 2.4* 2.4* 2.3* 2.5*    No results for input(s): LIPASE, AMYLASE in the last 168 hours. No results for input(s): AMMONIA in the last 168 hours.  CBC: Recent Labs  Lab 01/03/21 1225 01/04/21 0435 01/05/21 0500 01/06/21 0335 01/07/21 0229  WBC 6.5 6.2 8.1 7.9 9.3  HGB 8.2* 8.3* 8.3* 8.5* 9.4*  HCT 25.9* 26.8* 26.0* 26.2* 30.9*  MCV 84.4 85.4 85.2 84.8 87.5  PLT 366 240 269 223 260     Cardiac Enzymes: No results for input(s): CKTOTAL, CKMB, CKMBINDEX, TROPONINI in the last 168 hours.  BNP: Invalid input(s): POCBNP  CBG: Recent Labs  Lab 01/06/21 1042 01/06/21 1225 01/06/21 1526 01/06/21 1653 01/07/21 0745  GLUCAP 114* 113* 121* 124* 122*     Microbiology: Results for orders placed or performed during the hospital encounter of 12/27/20  Resp Panel by RT-PCR (Flu A&B, Covid) Nasopharyngeal Swab     Status: None   Collection Time: 12/27/20  8:53 AM   Specimen: Nasopharyngeal Swab; Nasopharyngeal(NP) swabs in vial transport medium  Result Value Ref Range Status   SARS Coronavirus 2 by RT PCR NEGATIVE NEGATIVE Final    Comment: (NOTE) SARS-CoV-2 target nucleic acids are NOT DETECTED.  The SARS-CoV-2 RNA is generally detectable in upper respiratory specimens during the acute phase of infection. The lowest concentration of SARS-CoV-2 viral copies this assay can detect is 138 copies/mL. A  negative result does not preclude SARS-Cov-2 infection and should not be used as the sole basis for treatment or other patient management decisions. A negative result may occur with  improper specimen collection/handling, submission of specimen other than nasopharyngeal swab, presence of viral mutation(s) within the areas targeted by this assay, and inadequate number of viral copies(<138 copies/mL). A negative result must be combined with clinical observations, patient history, and epidemiological information. The expected result is Negative.  Fact Sheet for Patients:  EntrepreneurPulse.com.au  Fact Sheet for Healthcare Providers:  IncredibleEmployment.be  This test is no t yet approved or cleared by the [place] FDA and  has been authorized for detection and/or diagnosis of SARS-CoV-2 by FDA under an Emergency Use Authorization (EUA). This EUA will remain  in effect (meaning this test can be used) for the duration of the COVID-19 declaration under Section 564(b)(1) of the Act, 21 U.S.C.section 360bbb-3(b)(1), unless the authorization is terminated  or revoked sooner.       Influenza A by PCR NEGATIVE NEGATIVE Final   Influenza B by PCR NEGATIVE NEGATIVE Final    Comment: (NOTE) The Xpert Xpress SARS-CoV-2/FLU/RSV plus assay is intended as an aid in the diagnosis of influenza from Nasopharyngeal swab specimens and should not be used as a sole basis for treatment. Nasal washings and aspirates are unacceptable for Xpert Xpress SARS-CoV-2/FLU/RSV testing.  Fact Sheet for Patients: EntrepreneurPulse.com.au  Fact Sheet for Healthcare Providers: IncredibleEmployment.be  This test is not yet approved or cleared by the [place] FDA and has been authorized for detection and/or diagnosis of SARS-CoV-2 by FDA under an Emergency Use Authorization (EUA). This EUA will remain in effect (meaning this test can  be used) for the duration of the COVID-19 declaration under Section 564(b)(1) of the Act, 21 U.S.C. section 360bbb-3(b)(1), unless the authorization is terminated or revoked.  Performed at [facility], [place] [address] [place], [place] [address]   Surgical pcr screen     Status: None   Collection Time: 01/05/21  7:42 PM   Specimen: Nasal Mucosa; Nasal Swab  Result Value Ref Range Status   MRSA, PCR NEGATIVE NEGATIVE Final   Staphylococcus aureus NEGATIVE NEGATIVE Final    Comment: (NOTE) The Xpert SA Assay (FDA approved for NASAL specimens in patients 72 years of age and older), is one component of a comprehensive surveillance program. It is not intended to diagnose infection nor to guide or monitor treatment. Performed  at [facility], [place] [address] [place], [place] [address]     Coagulation Studies: No results for input(s): LABPROT, INR in the last 72 hours.  Urinalysis: No results for input(s): COLORURINE, LABSPEC, PHURINE, GLUCOSEU, HGBUR, BILIRUBINUR, KETONESUR, PROTEINUR, UROBILINOGEN, NITRITE, LEUKOCYTESUR in the last 72 hours.  Invalid input(s): APPERANCEUR    Imaging: DG C-Arm 1-60 Min-No Report  Result Date: 01/06/2021 Fluoroscopy was utilized by the requesting physician.  No radiographic interpretation.   DG FEMUR MIN 2 VIEWS LEFT  Result Date: 01/06/2021 CLINICAL DATA:  IM nail left femur. EXAM: LEFT FEMUR 2 VIEWS COMPARISON:  Preoperative imaging. FINDINGS: Five fluoroscopic spot views of the left femur obtained in the operating room. Intramedullary nail with distal locking and trans trochanteric screw fixation of proximal femur fracture. Fluoroscopy time 49 seconds. IMPRESSION: Intraoperative fluoroscopy during ORIF of left femur fracture. Electronically Signed   By: Keith Rake M.D.   On: 01/06/2021 15:59     Medications:    sodium chloride     sodium chloride     sodium chloride     sodium chloride 10 mL/hr at 01/06/21 1409     amiodarone  200 mg Oral Daily   Chlorhexidine Gluconate Cloth  6 each Topical Daily   Chlorhexidine Gluconate Cloth  6 each Topical Q0600   cilostazol  50 mg Oral BID   docusate sodium  100 mg Oral BID   ezetimibe  10 mg Oral Daily   famotidine  20 mg Oral Daily   feeding supplement (NEPRO CARB STEADY)  237 mL Oral BID BM   heparin injection (subcutaneous)  5,000 Units Subcutaneous Q8H   hydrALAZINE  25 mg Oral Q8H   insulin aspart  0-6 Units Subcutaneous TID WC   isosorbide mononitrate  30 mg Oral Daily   lidocaine  1 patch Transdermal Q24H   metoprolol succinate  50 mg Oral Daily   multivitamin  1 tablet Oral QHS   pantoprazole  40 mg Oral QHS   rosuvastatin  10 mg Oral Daily   sodium chloride flush  3 mL Intravenous Q12H   sodium chloride, sodium chloride, sodium chloride, acetaminophen, alteplase, heparin, hydrALAZINE, hydrocortisone cream, HYDROmorphone (DILAUDID) injection, lidocaine (PF), lidocaine-prilocaine, loperamide, loratadine, menthol-cetylpyridinium **OR** phenol, metoCLOPramide **OR** metoCLOPramide (REGLAN) injection, Muscle Rub, naLOXone (NARCAN)  injection, pentafluoroprop-tetrafluoroeth, sodium chloride, sodium chloride flush  Assessment/ Plan:  1.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 Now s/p left ureteral stent placement but with no improvement. -HD started for uremia & failed lasix challenge. Rt fem temp line placed with IR on 9/18.  Hemodialysis started 01/01/2021.  Her last dialysis treatment was 01/05/2021.  Her next dialysis treatment will be 01/07/2021.  Patient for AV fistula 01/10/2021.  We will also have conversion to [facility].    2.Hematuria Left obstructive uropathy-  -in the setting of mildly obstructing 6 mm distal left ureter renal stone seen on CT urogram. S/p left ureteral stent on 9/15. ANCA and lupus serologies wnl. Appreciate urology's  assistance  3.Intertrochanteric hip fracture- s/p fall in the hospital.  -ortho on board, preop clearance for repair    4.Epistaxis- overnight 9/14, since resolved. Thought to be due to nasal canula. Heparin on hold, continue to monitor. Anca serologies neg  5.Left renal artery stenosis- Hx of 80-90% proximal left renal artery stenosis.  6.Hypertension- BP well controlled on norvasc, metoprolol, and hydralazine.  Ultrafiltration with dialysis blood pressure control improved   7.Hyponatremia-resolved  8.Afib w/ RVR-on amio. Hep gtt on hold given epistaxis, which has resolved.   9.Anemia due to chronic kidney disease-Hgb stable at 8.3   10.Diabetes Mellitus Type 2 with Hyperglycemia- -per primary    LOS: [place] '@TODAY''@7'$ :55 AM

## 2021-01-07 NOTE — Progress Notes (Signed)
     Molly [name] is a 70 y.o. female   Orthopaedic diagnosis:status post cephalo-medullary nail of left intertrochanteric hip fracture  Subjective: Patient is resting comfortably.  Objectyive: Vitals:   01/07/21 0400 01/07/21 0742  BP: (!) 131/49 (!) 127/47  Pulse: (!) 58 63  Resp:  17  Temp: (!) 97.3 F (36.3 C) (!) 97.4 F (36.3 C)  SpO2: 98% 99%     Exam: Respirations even and unlabored No acute distress  Left lower extremity normal resting position.  Dressing without drainage.  Assessment: Postop day 1 status post cephalo-medullary nail of left intertrochanteric hip fracture   Plan: Patient is doing well with regard to her hip fracture.  She is weightbearing as tolerated on the left lower extremity.  She will work with physical therapy. Finish perioperative antibiotics. She will follow-up in the office in 2 weeks for suture removal and x-rays of the left hip.  If she remains inpatient at that time we will remove the sutures then.    Radene Journey, MD

## 2021-01-07 NOTE — Progress Notes (Addendum)
Spoke to pt and pt's husband via phone. Discussed role. Explained to pt and pt's husband that if pt prefers to keep her previous nephrologist then pt will have to receive HD at a clinic in [place] where he sees pts. Explained that if pt wants to receive HD at a clinic in [place] then she will need to be agreeable with proceeding with a new nephrologist who sees pt in the [facility]. Pt states that she would like to discuss this decision with her husband and requests that I call her back around 3:00 this afternoon. Will f/u later today.   Melven Sartorius Renal Navigator [phone]   Addendum at 3:32pm: Returned call to pt who states that she and husband have not had the opportunity to discuss out-pt HD options. Advised pt that I will f/u with her on Monday to allow her and her husband to discuss things further this weekend. Pt's d/c plan could likely play in to pt's out-pt clinic placement as well.

## 2021-01-07 NOTE — Progress Notes (Signed)
BUE vein mapping has been completed.  Incidental findings given to Elisa, RN.  Results can be found under chart review under CV PROC. 01/07/2021 6:00 PM Adana Marik RVT, RDMS

## 2021-01-07 NOTE — Progress Notes (Signed)
ANTICOAGULATION CONSULT NOTE  Pharmacy Consult for Heparin Indication: atrial fibrillation  Allergies  Allergen Reactions   Lisinopril Other (See Comments)   Mercury Nausea And Vomiting    Patient Measurements: Height: '5\' 2"'$  (157.5 cm) Weight: 69.1 kg (152 lb 5.4 oz) IBW/kg (Calculated) : 50.1  Vital Signs: Temp: 97.4 F (36.3 C) (09/23 0742) Temp Source: Axillary (09/23 0742) BP: 127/47 (09/23 0742) Pulse Rate: 63 (09/23 0742)  Labs: Recent Labs    01/05/21 0500 01/06/21 0335 01/07/21 0229  HGB 8.3* 8.5* 9.4*  HCT 26.0* 26.2* 30.9*  PLT 269 223 260  CREATININE 4.01* 2.90* 3.77*     Estimated Creatinine Clearance: 13 mL/min (A) (by C-G formula based on SCr of 3.77 mg/dL (H)).   Assessment: 70 y.o. female with new onset Afib started on IV heparin earlier this admit.  Patient had a nosebleed on 9/14 and therapeutic heparin was held since.  GI cleared patient to resume anticoagulation post EGD on 9/21 and she is also s/p IM nail L hip on 9/22.  Patient needs an AV fistula tentatively scheduled for 9/26, so Cards asked pharmacy to resume IV heparin.  She was previously therapeutic on heparin infusion at 1000 units/hr (heparin levels 0.53 and 0.66 units/mL).  Will re-initiate IV heparin at at lower rate and aim for the lower goal to minimize risk of bleeding.  Goal of Therapy:  Heparin level 0.3-0.5 units/ml Monitor platelets by anticoagulation protocol: Yes   Plan:  D/C heparin SQ Initiate IV heparin at 950 units/hr - no bolus Check 8 hr heparin level Daily heparin level and CBC Monitor closely for s/sx of bleeding F/u with resuming Eliquis post HD access established  Adabella Stanis D. Mina Marble, PharmD, BCPS, [place] 01/07/2021, 12:35 PM

## 2021-01-08 DIAGNOSIS — Z515 Encounter for palliative care: Secondary | ICD-10-CM

## 2021-01-08 DIAGNOSIS — I5033 Acute on chronic diastolic (congestive) heart failure: Secondary | ICD-10-CM | POA: Diagnosis not present

## 2021-01-08 DIAGNOSIS — J9601 Acute respiratory failure with hypoxia: Secondary | ICD-10-CM

## 2021-01-08 DIAGNOSIS — J9622 Acute and chronic respiratory failure with hypercapnia: Secondary | ICD-10-CM | POA: Diagnosis not present

## 2021-01-08 DIAGNOSIS — N186 End stage renal disease: Secondary | ICD-10-CM

## 2021-01-08 DIAGNOSIS — J9621 Acute and chronic respiratory failure with hypoxia: Secondary | ICD-10-CM | POA: Diagnosis not present

## 2021-01-08 DIAGNOSIS — R778 Other specified abnormalities of plasma proteins: Secondary | ICD-10-CM | POA: Diagnosis not present

## 2021-01-08 DIAGNOSIS — D631 Anemia in chronic kidney disease: Secondary | ICD-10-CM

## 2021-01-08 DIAGNOSIS — J9602 Acute respiratory failure with hypercapnia: Secondary | ICD-10-CM

## 2021-01-08 DIAGNOSIS — I10 Essential (primary) hypertension: Secondary | ICD-10-CM | POA: Diagnosis not present

## 2021-01-08 LAB — CBC
HCT: 25.8 % — ABNORMAL LOW (ref 36.0–46.0)
Hemoglobin: 7.9 g/dL — ABNORMAL LOW (ref 12.0–15.0)
MCH: 26.9 pg (ref 26.0–34.0)
MCHC: 30.6 g/dL (ref 30.0–36.0)
MCV: 87.8 fL (ref 80.0–100.0)
Platelets: 279 10*3/uL (ref 150–400)
RBC: 2.94 MIL/uL — ABNORMAL LOW (ref 3.87–5.11)
RDW: 16.8 % — ABNORMAL HIGH (ref 11.5–15.5)
WBC: 8.7 10*3/uL (ref 4.0–10.5)
nRBC: 0 % (ref 0.0–0.2)

## 2021-01-08 LAB — RENAL FUNCTION PANEL
Albumin: 2.2 g/dL — ABNORMAL LOW (ref 3.5–5.0)
Anion gap: 11 (ref 5–15)
BUN: 39 mg/dL — ABNORMAL HIGH (ref 8–23)
CO2: 25 mmol/L (ref 22–32)
Calcium: 8.3 mg/dL — ABNORMAL LOW (ref 8.9–10.3)
Chloride: 99 mmol/L (ref 98–111)
Creatinine, Ser: 4.83 mg/dL — ABNORMAL HIGH (ref 0.44–1.00)
GFR, Estimated: 9 mL/min — ABNORMAL LOW (ref >60)
Glucose, Bld: 134 mg/dL — ABNORMAL HIGH (ref 70–99)
Phosphorus: 4.4 mg/dL (ref 2.5–4.6)
Potassium: 4.1 mmol/L (ref 3.5–5.1)
Sodium: 135 mmol/L (ref 135–145)

## 2021-01-08 LAB — GLUCOSE, CAPILLARY
Glucose-Capillary: 117 mg/dL — ABNORMAL HIGH (ref 70–99)
Glucose-Capillary: 105 mg/dL — ABNORMAL HIGH (ref 70–99)
Glucose-Capillary: 110 mg/dL — ABNORMAL HIGH (ref 70–99)
Glucose-Capillary: 165 mg/dL — ABNORMAL HIGH (ref 70–99)

## 2021-01-08 LAB — HEPARIN LEVEL (UNFRACTIONATED)
Heparin Unfractionated: 0.85 IU/mL — ABNORMAL HIGH (ref 0.30–0.70)
Heparin Unfractionated: 0.92 IU/mL — ABNORMAL HIGH (ref 0.30–0.70)

## 2021-01-08 MED ORDER — LIDOCAINE-PRILOCAINE 2.5-2.5 % EX CREA
1.0000 "application " | TOPICAL_CREAM | CUTANEOUS | Status: DC | PRN
  Filled 2021-01-08: qty 5

## 2021-01-08 MED ORDER — HYDROMORPHONE HCL 1 MG/ML IJ SOLN: 0.2500 mg | INTRAMUSCULAR | Status: DC | PRN

## 2021-01-08 MED ORDER — ALTEPLASE 2 MG IJ SOLR
2.0000 mg | Freq: Once | INTRAMUSCULAR | Status: DC | PRN
  Filled 2021-01-08: qty 2

## 2021-01-08 MED ORDER — ACETAMINOPHEN 500 MG PO TABS
1000.0000 mg | ORAL_TABLET | Freq: Three times a day (TID) | ORAL | Status: DC
  Administered 2021-01-08 – 2021-01-17 (×21): 1000 mg via ORAL
  Filled 2021-01-08 (×23): qty 2

## 2021-01-08 MED ORDER — SODIUM CHLORIDE 0.9 % IV SOLN: 100.0000 mL | INTRAVENOUS | Status: DC | PRN

## 2021-01-08 MED ORDER — HEPARIN SODIUM (PORCINE) 1000 UNIT/ML DIALYSIS
1000.0000 [IU] | INTRAMUSCULAR | Status: DC | PRN
  Filled 2021-01-08: qty 1

## 2021-01-08 MED ORDER — PENTAFLUOROPROP-TETRAFLUOROETH EX AERO: 1.0000 "application " | INHALATION_SPRAY | CUTANEOUS | Status: DC | PRN

## 2021-01-08 MED ORDER — LIDOCAINE HCL (PF) 1 % IJ SOLN
5.0000 mL | INTRAMUSCULAR | Status: DC | PRN
  Filled 2021-01-08: qty 5

## 2021-01-08 MED ORDER — HEPARIN (PORCINE) 25000 UT/250ML-% IV SOLN
500.0000 [IU]/h | INTRAVENOUS | Status: DC
  Administered 2021-01-08: 500 [IU]/h via INTRAVENOUS
  Filled 2021-01-08 (×2): qty 250

## 2021-01-08 NOTE — Progress Notes (Signed)
[facility] ROUNDING NOTE   Subjective:   Interval History: 70 year old lady with history of hypertension left renal artery stenosis [id number] diastolic dysfunction chronic kidney disease stage IV peripheral artery disease bilateral  superficial femoral artery obstruction.  Anemia, diabetes.  Presented 12/27/2020 with chest pain.  Was also found to have uncontrolled hypertension.  Blood pressure 142/64 pulse 69 temperature 98.7 O2 sats 90% 2 L nasal cannula.  Last dialysis 9/21 with 2 L.  Patient is receiving dialysis 01/08/2021.  This is off schedule as there is high dialysis patient volume  Sodium 135 potassium 4.1 chloride 99 CO2 25 BUN 39 creatinine 4.8 glucose 134 calcium 8.3 phosphorus 4.4 albumin 2.2 hemoglobin 7.9   minimal urine output  Objective:  Vital signs in last 24 hours:  Temp:  [97.8 F (36.6 C)-98.7 F (37.1 C)] 98.6 F (37 C) (09/24 0831) Pulse Rate:  [67-76] 69 (09/24 0900) Resp:  [14-18] 16 (09/24 0900) BP: (119-147)/(43-69) 142/64 (09/24 0900) SpO2:  [98 %-100 %] 100 % (09/24 0831) Weight:  [64.9 kg-69.8 kg] 64.9 kg (09/24 0831)  Weight change: 5.7 kg Filed Weights   01/07/21 0500 01/08/21 0604 01/08/21 0831  Weight: 69.1 kg 69.8 kg 64.9 kg    Intake/Output: I/O last 3 completed shifts: In: 340 [P.O.:240; IV Piggyback:100] Out: -    Intake/Output this shift:  No intake/output data recorded.  Gen: in distress, yelling, laying flat in bed CVS:RRR, no murmurs Resp:crackles bibasilar, normal WOB [id number], nontender Ext:no edema Neuro: not orientable, yelling, moving all ext spontaneously   Basic Metabolic Panel: Recent Labs  Lab 01/03/21 0803 01/03/21 1815 01/03/21 2053 01/04/21 0435 01/04/21 1732 01/05/21 0500 01/06/21 0335 01/07/21 0229 01/08/21 0720  NA 134*  --    < > 133*  --  133* 134* 136 135  K 3.6  --    < > 3.6  --  3.6 3.6 3.8 4.1  CL 93*  --    < > 96*  --  96* 98 101 99  CO2 24  --    < > 25  --  '26 28 25 25  '$ GLUCOSE  110*  --    < > 146*  --  122* 121* 132* 134*  BUN 48*  --    < > 30*  --  34* 18 26* 39*  CREATININE 4.89*  --    < > 3.39*  --  4.01* 2.90* 3.77* 4.83*  CALCIUM 8.6*  --    < > 8.0*  --  8.2* 8.0* 8.3* 8.3*  MG 2.0 1.9  --  1.9 1.9 1.9  --   --   --   PHOS  --   --    < > 3.0  --  2.9 2.9 4.0 4.4   < > = values in this interval not displayed.     Liver Function Tests: Recent Labs  Lab 01/03/21 0803 01/03/21 2053 01/04/21 0435 01/05/21 0500 01/06/21 0335 01/07/21 0229 01/08/21 0720  AST 114*  --   --  86*  --   --   --   ALT 100*  --   --  79*  --   --   --   ALKPHOS 93  --   --  91  --   --   --   BILITOT 0.7  --   --  0.6  --   --   --   PROT 6.7  --   --  6.1*  --   --   --  ALBUMIN 2.7*   < > 2.4* 2.4* 2.3* 2.5* 2.2*   < > = values in this interval not displayed.    No results for input(s): LIPASE, AMYLASE in the last 168 hours. No results for input(s): AMMONIA in the last 168 hours.  CBC: Recent Labs  Lab 01/04/21 0435 01/05/21 0500 01/06/21 0335 01/07/21 0229 01/08/21 0720  WBC 6.2 8.1 7.9 9.3 8.7  HGB 8.3* 8.3* 8.5* 9.4* 7.9*  HCT 26.8* 26.0* 26.2* 30.9* 25.8*  MCV 85.4 85.2 84.8 87.5 87.8  PLT 240 269 223 260 279     Cardiac Enzymes: No results for input(s): CKTOTAL, CKMB, CKMBINDEX, TROPONINI in the last 168 hours.  BNP: Invalid input(s): POCBNP  CBG: Recent Labs  Lab 01/07/21 0745 01/07/21 1124 01/07/21 1628 01/07/21 2113 01/08/21 0615  GLUCAP 122* 137* 127* 123* 117*     Microbiology: Results for orders placed or performed during the hospital encounter of 12/27/20  Resp Panel by RT-PCR (Flu A&B, Covid) Nasopharyngeal Swab     Status: None   Collection Time: 12/27/20  8:53 AM   Specimen: Nasopharyngeal Swab; Nasopharyngeal(NP) swabs in vial transport medium  Result Value Ref Range Status   SARS Coronavirus 2 by RT PCR NEGATIVE NEGATIVE Final    Comment: (NOTE) SARS-CoV-2 target nucleic acids are NOT DETECTED.  The SARS-CoV-2 RNA  is generally detectable in upper respiratory specimens during the acute phase of infection. The lowest concentration of SARS-CoV-2 viral copies this assay can detect is 138 copies/mL. A negative result does not preclude SARS-Cov-2 infection and should not be used as the sole basis for treatment or other patient management decisions. A negative result may occur with  improper specimen collection/handling, submission of specimen other than nasopharyngeal swab, presence of viral mutation(s) within the areas targeted by this assay, and inadequate number of viral copies(<138 copies/mL). A negative result must be combined with clinical observations, patient history, and epidemiological information. The expected result is Negative.  Fact Sheet for Patients:  EntrepreneurPulse.com.au  Fact Sheet for Healthcare Providers:  IncredibleEmployment.be  This test is no t yet approved or cleared by the [place] FDA and  has been authorized for detection and/or diagnosis of SARS-CoV-2 by FDA under an Emergency Use Authorization (EUA). This EUA will remain  in effect (meaning this test can be used) for the duration of the COVID-19 declaration under Section 564(b)(1) of the Act, 21 U.S.C.section 360bbb-3(b)(1), unless the authorization is terminated  or revoked sooner.       Influenza A by PCR NEGATIVE NEGATIVE Final   Influenza B by PCR NEGATIVE NEGATIVE Final    Comment: (NOTE) The Xpert Xpress SARS-CoV-2/FLU/RSV plus assay is intended as an aid in the diagnosis of influenza from Nasopharyngeal swab specimens and should not be used as a sole basis for treatment. Nasal washings and aspirates are unacceptable for Xpert Xpress SARS-CoV-2/FLU/RSV testing.  Fact Sheet for Patients: EntrepreneurPulse.com.au  Fact Sheet for Healthcare Providers: IncredibleEmployment.be  This test is not yet approved or cleared by the  [place] FDA and has been authorized for detection and/or diagnosis of SARS-CoV-2 by FDA under an Emergency Use Authorization (EUA). This EUA will remain in effect (meaning this test can be used) for the duration of the COVID-19 declaration under Section 564(b)(1) of the Act, 21 U.S.C. section 360bbb-3(b)(1), unless the authorization is terminated or revoked.  Performed at [facility], [place] [address] [place], [place] [address]   Surgical pcr screen     Status: None  Collection Time: 01/05/21  7:42 PM   Specimen: Nasal Mucosa; Nasal Swab  Result Value Ref Range Status   MRSA, PCR NEGATIVE NEGATIVE Final   Staphylococcus aureus NEGATIVE NEGATIVE Final    Comment: (NOTE) The Xpert SA Assay (FDA approved for NASAL specimens in patients 26 years of age and older), is one component of a comprehensive surveillance program. It is not intended to diagnose infection nor to guide or monitor treatment. Performed at [facility], [place] [address], [place], [place] [address]     Coagulation Studies: No results for input(s): LABPROT, INR in the last 72 hours.  Urinalysis: No results for input(s): COLORURINE, LABSPEC, PHURINE, GLUCOSEU, HGBUR, BILIRUBINUR, KETONESUR, PROTEINUR, UROBILINOGEN, NITRITE, LEUKOCYTESUR in the last 72 hours.  Invalid input(s): APPERANCEUR    Imaging: DG C-Arm 1-60 Min-No Report  Result Date: 01/06/2021 Fluoroscopy was utilized by the requesting physician.  No radiographic interpretation.   DG FEMUR MIN 2 VIEWS LEFT  Result Date: 01/06/2021 CLINICAL DATA:  IM nail left femur. EXAM: LEFT FEMUR 2 VIEWS COMPARISON:  Preoperative imaging. FINDINGS: Five fluoroscopic spot views of the left femur obtained in the operating room. Intramedullary nail with distal locking and trans trochanteric screw fixation of proximal femur fracture. Fluoroscopy time 49 seconds. IMPRESSION: Intraoperative fluoroscopy during ORIF of left femur fracture. Electronically  Signed   By: Keith Rake M.D.   On: 01/06/2021 15:59   VAS [place] UPPER EXT VEIN MAPPING (PRE-OP AVF)  Result Date: 01/07/2021 UPPER EXTREMITY VEIN MAPPING Patient Name:  ZAKYIA BAYAT  Date of Exam:   01/07/2021 Medical Rec #: [id number]     Accession #:    [id number] Date of Birth: 1951/01/31     Patient Gender: F Patient Age:   72 years Exam Location:  [facility] Procedure:      VAS [place] UPPER EXT VEIN MAPPING (PRE-OP AVF) Referring Phys: Melony Overly --------------------------------------------------------------------------------  Indications: Pre-access. Comparison Study: No previous exams Performing Technologist: Jody Hill RVT, RDMS  Examination Guidelines: A complete evaluation includes B-mode imaging, spectral Doppler, color Doppler, and power Doppler as needed of all accessible portions of each vessel. Bilateral testing is considered an integral part of a complete examination. Limited examinations for reoccurring indications may be performed as noted. +-----------------+-------------+----------+-------------------------------+ Right Cephalic   Diameter (cm)Depth (cm)           Findings             +-----------------+-------------+----------+-------------------------------+ Shoulder             0.29        1.51                                   +-----------------+-------------+----------+-------------------------------+ Prox upper arm       0.26        0.57                                   +-----------------+-------------+----------+-------------------------------+ Mid upper arm        0.22        0.68                                   +-----------------+-------------+----------+-------------------------------+ Dist upper arm       0.20  0.64                                   +-----------------+-------------+----------+-------------------------------+ Antecubital fossa    0.35        0.26                                    +-----------------+-------------+----------+-------------------------------+ Prox forearm         0.26        0.46              branching            +-----------------+-------------+----------+-------------------------------+ Mid forearm          0.21        0.69                                   +-----------------+-------------+----------+-------------------------------+ Dist forearm         0.24        0.28                                   +-----------------+-------------+----------+-------------------------------+ Wrist                                   not visualized and IV placement +-----------------+-------------+----------+-------------------------------+ +-----------------+-------------+----------+-------------------------------+ Right Basilic    Diameter (cm)Depth (cm)           Findings             +-----------------+-------------+----------+-------------------------------+ Prox upper arm       0.23                                               +-----------------+-------------+----------+-------------------------------+ Mid upper arm        0.26                                               +-----------------+-------------+----------+-------------------------------+ Dist upper arm       0.18                                               +-----------------+-------------+----------+-------------------------------+ Antecubital fossa    0.16                          branching            +-----------------+-------------+----------+-------------------------------+ Prox forearm         0.12                                               +-----------------+-------------+----------+-------------------------------+ Mid forearm          0.08                                               +-----------------+-------------+----------+-------------------------------+  Distal forearm                                  not visualized           +-----------------+-------------+----------+-------------------------------+ Wrist                                   not visualized and IV placement +-----------------+-------------+----------+-------------------------------+ Subcutaneous edema noted from mid upper arm to mid forearm. +-----------------+-------------+----------+----------------------+ Left Cephalic    Diameter (cm)Depth (cm)       Findings        +-----------------+-------------+----------+----------------------+ Shoulder             0.34        1.14                          +-----------------+-------------+----------+----------------------+ Prox upper arm       0.36        0.97                          +-----------------+-------------+----------+----------------------+ Mid upper arm        0.39        0.99                          +-----------------+-------------+----------+----------------------+ Dist upper arm       0.42        0.91                          +-----------------+-------------+----------+----------------------+ Antecubital fossa    0.58        0.37   branching and thrombus +-----------------+-------------+----------+----------------------+ Prox forearm         0.12        0.73         branching        +-----------------+-------------+----------+----------------------+ Mid forearm          0.07        0.37                          +-----------------+-------------+----------+----------------------+ Dist forearm                                not visualized     +-----------------+-------------+----------+----------------------+ Wrist                                       not visualized     +-----------------+-------------+----------+----------------------+ +-----------------+-------------+----------+---------+ Left Basilic     Diameter (cm)Depth (cm)Findings  +-----------------+-------------+----------+---------+ Prox upper arm       0.34                          +-----------------+-------------+----------+---------+ Mid upper arm        0.23                         +-----------------+-------------+----------+---------+ Dist upper arm       0.16                         +-----------------+-------------+----------+---------+  Antecubital fossa    0.15               branching +-----------------+-------------+----------+---------+ Prox forearm         0.13                         +-----------------+-------------+----------+---------+ Mid forearm          0.12                         +-----------------+-------------+----------+---------+ Distal forearm       0.13                         +-----------------+-------------+----------+---------+ Wrist                0.12                         +-----------------+-------------+----------+---------+ Subcutaneous edema in area of AC fossa Summary:  Left: Age indeterminate SVT seen in cephalic vein in area of AC       fossa. *See table(s) above for measurements and observations.  Diagnosing physician:    Preliminary      Medications:    sodium chloride     sodium chloride     sodium chloride     sodium chloride     sodium chloride     sodium chloride 10 mL/hr at 01/06/21 1409   heparin 750 Units/hr (01/07/21 2233)    amiodarone  200 mg Oral Daily   Chlorhexidine Gluconate Cloth  6 each Topical Daily   Chlorhexidine Gluconate Cloth  6 each Topical Q0600   cilostazol  50 mg Oral BID   docusate sodium  100 mg Oral BID   ezetimibe  10 mg Oral Daily   famotidine  20 mg Oral Daily   feeding supplement (NEPRO CARB STEADY)  237 mL Oral BID BM   hydrALAZINE  25 mg Oral Q8H   insulin aspart  0-6 Units Subcutaneous TID WC   isosorbide mononitrate  30 mg Oral Daily   lidocaine  1 patch Transdermal Q24H   metoprolol succinate  50 mg Oral Daily   multivitamin  1 tablet Oral QHS   pantoprazole  40 mg Oral QHS   rosuvastatin  10 mg Oral Daily   sodium chloride flush  3 mL Intravenous Q12H    sodium chloride, sodium chloride, sodium chloride, sodium chloride, sodium chloride, acetaminophen, alteplase, alteplase, heparin, heparin, hydrALAZINE, hydrocortisone cream, HYDROmorphone (DILAUDID) injection, lidocaine (PF), lidocaine (PF), lidocaine-prilocaine, lidocaine-prilocaine, loperamide, loratadine, menthol-cetylpyridinium **OR** phenol, metoCLOPramide **OR** metoCLOPramide (REGLAN) injection, Muscle Rub, naLOXone (NARCAN)  injection, pentafluoroprop-tetrafluoroeth, pentafluoroprop-tetrafluoroeth, sodium chloride, sodium chloride flush  Assessment/ Plan:  1.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 Now s/p left ureteral stent placement but with no improvement. -HD started for uremia & failed lasix challenge. Rt fem temp line placed with IR on 9/18.  Hemodialysis started 01/01/2021.  Her last dialysis treatment was 01/05/2021.  Patient is receiving dialysis 01/09/2023 off schedule due to high dialysis volumes.  Patient for AV fistula 01/10/2021.  We will also have conversion to [facility].    2.Hematuria Left obstructive uropathy-  -in the setting of mildly obstructing 6 mm distal left ureter renal stone seen on CT urogram. S/p left ureteral stent on 9/15. ANCA and lupus serologies wnl. Appreciate urology's assistance  3.Intertrochanteric hip fracture- s/p fall in the hospital.  -ortho on board, preop clearance for repair    4.Epistaxis- overnight 9/14, since resolved. Thought to be due to nasal canula. Heparin on hold, continue to monitor. Anca serologies neg  5.Left renal artery stenosis- Hx of 80-90% proximal left renal artery stenosis.  6.Hypertension- BP well controlled on norvasc, metoprolol, and hydralazine.  Ultrafiltration with dialysis blood pressure control improved   7.Hyponatremia-resolved  8.Afib w/ RVR-on amio. Hep gtt on hold given epistaxis, which has resolved.   9.Anemia  due to chronic kidney disease-Hgb stable at 8.3   10.Diabetes Mellitus Type 2 with Hyperglycemia- -per primary    LOS: [place] '@TODAY''@9'$ :09 AM

## 2021-01-08 NOTE — Progress Notes (Signed)
Cardiology Progress Note  Patient ID: Molly [name] MRN: [id number] DOB: December 30, 1950 Date of Encounter: 01/08/2021  Primary Cardiologist: Jenne Campus, MD  Subjective   On dialysis Teary eyed " I can't take this anymore"  Pain back  Inpatient Medications  Scheduled Meds:  amiodarone  200 mg Oral Daily   Chlorhexidine Gluconate Cloth  6 each Topical Daily   Chlorhexidine Gluconate Cloth  6 each Topical Q0600   cilostazol  50 mg Oral BID   docusate sodium  100 mg Oral BID   ezetimibe  10 mg Oral Daily   famotidine  20 mg Oral Daily   feeding supplement (NEPRO CARB STEADY)  237 mL Oral BID BM   hydrALAZINE  25 mg Oral Q8H   insulin aspart  0-6 Units Subcutaneous TID WC   isosorbide mononitrate  30 mg Oral Daily   lidocaine  1 patch Transdermal Q24H   metoprolol succinate  50 mg Oral Daily   multivitamin  1 tablet Oral QHS   pantoprazole  40 mg Oral QHS   rosuvastatin  10 mg Oral Daily   sodium chloride flush  3 mL Intravenous Q12H   Continuous Infusions:  sodium chloride     sodium chloride     sodium chloride     sodium chloride     sodium chloride     sodium chloride 10 mL/hr at 01/06/21 1409   heparin 750 Units/hr (01/07/21 2233)   PRN Meds: sodium chloride, sodium chloride, sodium chloride, sodium chloride, sodium chloride, acetaminophen, alteplase, alteplase, heparin, heparin, hydrALAZINE, hydrocortisone cream, HYDROmorphone (DILAUDID) injection, lidocaine (PF), lidocaine (PF), lidocaine-prilocaine, lidocaine-prilocaine, loperamide, loratadine, menthol-cetylpyridinium **OR** phenol, metoCLOPramide **OR** metoCLOPramide (REGLAN) injection, Muscle Rub, naLOXone (NARCAN)  injection, pentafluoroprop-tetrafluoroeth, pentafluoroprop-tetrafluoroeth, sodium chloride, sodium chloride flush   Vital Signs   Vitals:   01/08/21 0831 01/08/21 0840 01/08/21 0900 01/08/21 0930  BP: (!) 147/62 (!) 147/69 (!) 142/64 (!) 127/57  Pulse: 73 73 69 69  Resp: '14 16 16 17  '$ Temp: 98.6 F  (37 C)     TempSrc: Oral     SpO2: 100%     Weight: 64.9 kg     Height:        Intake/Output Summary (Last 24 hours) at 01/08/2021 1004 Last data filed at 01/07/2021 1431 Gross per 24 hour  Intake 240 ml  Output --  Net 240 ml   Last 3 Weights 01/08/2021 01/08/2021 01/07/2021  Weight (lbs) 143 lb 1.3 oz 153 lb 14.1 oz 152 lb 5.4 oz  Weight (kg) 64.9 kg 69.8 kg 69.1 kg      Telemetry  Overnight telemetry shows SR 70s, which I personally reviewed.   Physical Exam   Vitals:   01/08/21 0831 01/08/21 0840 01/08/21 0900 01/08/21 0930  BP: (!) 147/62 (!) 147/69 (!) 142/64 (!) 127/57  Pulse: 73 73 69 69  Resp: '14 16 16 17  '$ Temp: 98.6 F (37 C)     TempSrc: Oral     SpO2: 100%     Weight: 64.9 kg     Height:        Intake/Output Summary (Last 24 hours) at 01/08/2021 1004 Last data filed at 01/07/2021 1431 Gross per 24 hour  Intake 240 ml  Output --  Net 240 ml    Last 3 Weights 01/08/2021 01/08/2021 01/07/2021  Weight (lbs) 143 lb 1.3 oz 153 lb 14.1 oz 152 lb 5.4 oz  Weight (kg) 64.9 kg 69.8 kg 69.1 kg    Body mass index is 26.17  kg/m.   Chronically ill black female Proptosis Lungs clear No murmur  Right femoral dialysis catheter  Post pinning of left hip fracture    Labs  High Sensitivity Troponin:   Recent Labs  Lab 12/27/20 0640 12/27/20 0835  TROPONINIHS 397* 376*     Cardiac EnzymesNo results for input(s): TROPONINI in the last 168 hours. No results for input(s): TROPIPOC in the last 168 hours.  Chemistry Recent Labs  Lab 01/03/21 0803 01/03/21 2053 01/05/21 0500 01/06/21 0335 01/07/21 0229 01/08/21 0720  NA 134*   < > 133* 134* 136 135  K 3.6   < > 3.6 3.6 3.8 4.1  CL 93*   < > 96* 98 101 99  CO2 24   < > '26 28 25 25  '$ GLUCOSE 110*   < > 122* 121* 132* 134*  BUN 48*   < > 34* 18 26* 39*  CREATININE 4.89*   < > 4.01* 2.90* 3.77* 4.83*  CALCIUM 8.6*   < > 8.2* 8.0* 8.3* 8.3*  PROT 6.7  --  6.1*  --   --   --   ALBUMIN 2.7*   < > 2.4* 2.3* 2.5* 2.2*   AST 114*  --  86*  --   --   --   ALT 100*  --  79*  --   --   --   ALKPHOS 93  --  91  --   --   --   BILITOT 0.7  --  0.6  --   --   --   GFRNONAA 9*   < > 12* 17* 12* 9*  ANIONGAP 17*   < > '11 8 10 11   '$ < > = values in this interval not displayed.    Hematology Recent Labs  Lab 01/06/21 0335 01/07/21 0229 01/08/21 0720  WBC 7.9 9.3 8.7  RBC 3.09* 3.53* 2.94*  HGB 8.5* 9.4* 7.9*  HCT 26.2* 30.9* 25.8*  MCV 84.8 87.5 87.8  MCH 27.5 26.6 26.9  MCHC 32.4 30.4 30.6  RDW 16.3* 16.6* 16.8*  PLT 223 260 279   BNPNo results for input(s): BNP, PROBNP in the last 168 hours.  DDimer No results for input(s): DDIMER in the last 168 hours.   Radiology  DG C-Arm 1-60 Min-No Report  Result Date: 01/06/2021 Fluoroscopy was utilized by the requesting physician.  No radiographic interpretation.   DG FEMUR MIN 2 VIEWS LEFT  Result Date: 01/06/2021 CLINICAL DATA:  IM nail left femur. EXAM: LEFT FEMUR 2 VIEWS COMPARISON:  Preoperative imaging. FINDINGS: Five fluoroscopic spot views of the left femur obtained in the operating room. Intramedullary nail with distal locking and trans trochanteric screw fixation of proximal femur fracture. Fluoroscopy time 49 seconds. IMPRESSION: Intraoperative fluoroscopy during ORIF of left femur fracture. Electronically Signed   By: Keith Rake M.D.   On: 01/06/2021 15:59   VAS [place] UPPER EXT VEIN MAPPING (PRE-OP AVF)  Result Date: 01/07/2021 UPPER EXTREMITY VEIN MAPPING Patient Name:  SEGEN TITUS  Date of Exam:   01/07/2021 Medical Rec #: [id number]     Accession #:    [id number] Date of Birth: 08-Aug-1950     Patient Gender: F Patient Age:   70 years Exam Location:  [facility] [address] Procedure:      VAS [place] UPPER EXT VEIN MAPPING (PRE-OP AVF) Referring Phys: Melony Overly --------------------------------------------------------------------------------  Indications: Pre-access. Comparison Study: No previous exams Performing Technologist: Jody Hill RVT, RDMS   Examination Guidelines: A complete  evaluation includes B-mode imaging, spectral Doppler, color Doppler, and power Doppler as needed of all accessible portions of each vessel. Bilateral testing is considered an integral part of a complete examination. Limited examinations for reoccurring indications may be performed as noted. +-----------------+-------------+----------+-------------------------------+ Right Cephalic   Diameter (cm)Depth (cm)           Findings             +-----------------+-------------+----------+-------------------------------+ Shoulder             0.29        1.51                                   +-----------------+-------------+----------+-------------------------------+ Prox upper arm       0.26        0.57                                   +-----------------+-------------+----------+-------------------------------+ Mid upper arm        0.22        0.68                                   +-----------------+-------------+----------+-------------------------------+ Dist upper arm       0.20        0.64                                   +-----------------+-------------+----------+-------------------------------+ Antecubital fossa    0.35        0.26                                   +-----------------+-------------+----------+-------------------------------+ Prox forearm         0.26        0.46              branching            +-----------------+-------------+----------+-------------------------------+ Mid forearm          0.21        0.69                                   +-----------------+-------------+----------+-------------------------------+ Dist forearm         0.24        0.28                                   +-----------------+-------------+----------+-------------------------------+ Wrist                                   not visualized and IV placement +-----------------+-------------+----------+-------------------------------+  +-----------------+-------------+----------+-------------------------------+ Right Basilic    Diameter (cm)Depth (cm)           Findings             +-----------------+-------------+----------+-------------------------------+ Prox upper arm       0.23                                               +-----------------+-------------+----------+-------------------------------+  Mid upper arm        0.26                                               +-----------------+-------------+----------+-------------------------------+ Dist upper arm       0.18                                               +-----------------+-------------+----------+-------------------------------+ Antecubital fossa    0.16                          branching            +-----------------+-------------+----------+-------------------------------+ Prox forearm         0.12                                               +-----------------+-------------+----------+-------------------------------+ Mid forearm          0.08                                               +-----------------+-------------+----------+-------------------------------+ Distal forearm                                  not visualized          +-----------------+-------------+----------+-------------------------------+ Wrist                                   not visualized and IV placement +-----------------+-------------+----------+-------------------------------+ Subcutaneous edema noted from mid upper arm to mid forearm. +-----------------+-------------+----------+----------------------+ Left Cephalic    Diameter (cm)Depth (cm)       Findings        +-----------------+-------------+----------+----------------------+ Shoulder             0.34        1.14                          +-----------------+-------------+----------+----------------------+ Prox upper arm       0.36        0.97                           +-----------------+-------------+----------+----------------------+ Mid upper arm        0.39        0.99                          +-----------------+-------------+----------+----------------------+ Dist upper arm       0.42        0.91                          +-----------------+-------------+----------+----------------------+ Antecubital fossa    0.58        0.37   branching and thrombus +-----------------+-------------+----------+----------------------+  Prox forearm         0.12        0.73         branching        +-----------------+-------------+----------+----------------------+ Mid forearm          0.07        0.37                          +-----------------+-------------+----------+----------------------+ Dist forearm                                not visualized     +-----------------+-------------+----------+----------------------+ Wrist                                       not visualized     +-----------------+-------------+----------+----------------------+ +-----------------+-------------+----------+---------+ Left Basilic     Diameter (cm)Depth (cm)Findings  +-----------------+-------------+----------+---------+ Prox upper arm       0.34                         +-----------------+-------------+----------+---------+ Mid upper arm        0.23                         +-----------------+-------------+----------+---------+ Dist upper arm       0.16                         +-----------------+-------------+----------+---------+ Antecubital fossa    0.15               branching +-----------------+-------------+----------+---------+ Prox forearm         0.13                         +-----------------+-------------+----------+---------+ Mid forearm          0.12                         +-----------------+-------------+----------+---------+ Distal forearm       0.13                          +-----------------+-------------+----------+---------+ Wrist                0.12                         +-----------------+-------------+----------+---------+ Subcutaneous edema in area of AC fossa Summary:  Left: Age indeterminate SVT seen in cephalic vein in area of AC       fossa. *See table(s) above for measurements and observations.  Diagnosing physician:    Preliminary     Cardiac Studies  TTE 12/28/2020  1. Compared with the echo [id number], systolic function is reduced. Left  ventricular ejection fraction, by estimation, is 40 to 45%. The left  ventricle has mildly decreased function. The left ventricle demonstrates  global hypokinesis. Left ventricular  diastolic parameters are indeterminate.   2. Right ventricular systolic function is normal. The right ventricular  size is normal. There is moderately elevated pulmonary artery systolic  pressure.   3. The pericardial effusion is circumferential.   4. The mitral valve is normal in structure. Mild mitral valve  regurgitation. No evidence of mitral stenosis.   5. The aortic valve is tricuspid. There is mild calcification of the  aortic valve. There is mild thickening of the aortic valve. Aortic valve  regurgitation is not visualized. No aortic stenosis is present.   6. The inferior vena cava is dilated in size with <50% respiratory  variability, suggesting right atrial pressure of 15 mmHg.   Patient Profile  70 year old female with history of CKD stage IV, diastolic heart failure, PAD (bilateral SFA CTO's), left renal artery stenosis (90%), diabetes, CAD who was admitted on 12/27/2020 with chest pain and acute hypoxic respiratory failure secondary to pulmonary edema from likely hypertensive crisis.  Course complicated by A. fib with RVR 12/28/2020.  She is also developed AKI requiring hemodialysis.  She did suffer a fall this admission that resulted in a left hip fracture.  This has been repaired.  Course also complicated by anemia which  has been evaluated by GI with no evidence of active bleeding.  Assessment & Plan   #A. fib with RVR:   converted to NSR on amiodarone load for 7 days then 200 mg for 3 weeks not long term Rx. Covering with heparin for now as she is to have dialysis access vascular surgery ? Monday Start eliquis after fistula /tunneled access surgery complete. Telemetry on dialysis this am NSR   #CHF:  EF 40-45% Volume managed with dialysis continue Toprol hydralazine Imdur Outpatient ischemic evaluation  #Left hip fracture -Status postrepair on 01/06/2021.  Doing well.    Jenkins Rouge MD [facility]

## 2021-01-08 NOTE — Progress Notes (Signed)
ANTICOAGULATION CONSULT NOTE  Pharmacy Consult for Heparin Indication: atrial fibrillation  Allergies  Allergen Reactions   Lisinopril Other (See Comments)   Mercury Nausea And Vomiting    Patient Measurements: Height: '5\' 2"'$  (157.5 cm) Weight: 64.9 kg (143 lb 1.3 oz) IBW/kg (Calculated) : 50.1  Vital Signs: Temp: 98.6 F (37 C) (09/24 0831) Temp Source: Oral (09/24 0831) BP: 147/62 (09/24 0831) Pulse Rate: 73 (09/24 0831)  Labs: Recent Labs    01/06/21 0335 01/07/21 0229 01/07/21 2200 01/08/21 0720  HGB 8.5* 9.4*  --  7.9*  HCT 26.2* 30.9*  --  25.8*  PLT 223 260  --  279  HEPARINUNFRC  --   --  0.88* 0.85*  CREATININE 2.90* 3.77*  --  4.83*     Estimated Creatinine Clearance: 9.9 mL/min (A) (by C-G formula based on SCr of 4.83 mg/dL (H)).   Assessment: 70 y.o. female with new onset Afib started on IV heparin earlier this admit.  Patient had a nosebleed on 9/14 and therapeutic heparin was held since.  GI cleared patient to resume anticoagulation post EGD on 9/21 and she is also s/p IM nail L hip on 9/22.  Patient needs an AV fistula tentatively scheduled for 9/26, so Cards asked pharmacy to resume IV heparin.  Heparin level after dose reduction= 0.85 on 750 units/hr  Goal of Therapy:  Heparin level 0.3-0.5 units/ml Monitor platelets by anticoagulation protocol: Yes   Plan:  -Decrease heparin to 600 units/hr -Heparin level in 6 hours and daily wth CBC daily -Monitor closely for s/sx of bleed   Cephus Slater, PharmD, [place] Resident [phone] 01/08/2021 8:46 AM

## 2021-01-08 NOTE — Consult Note (Signed)
Palliative Care Consult Note                                  Date: 01/08/2021   Patient Name: Molly [name]  DOB: 27-Aug-1950  MRN: [id number]  Age / Sex: 70 y.o., female  PCP: [name], MD Referring Physician: Barb Merino, MD  Reason for Consultation: Establishing goals of care, Non pain symptom management, and Pain control  HPI/Patient Profile: Palliative Care consult requested for goals of care discussion and symptom management in this 70 y.o. female  with past medical history of CKD IV, PAD, hypertension, diastolic CHF, diabetes type 2, and GERD. She was admitted on 12/27/2020 from home with right substernal chest pain. During work-up patient was found to be hypertensive (234/208) and oxygen saturations 78% requiring BiPAP. BUN 36, Creatinine 3.53, BNP 1102. Chest x-Molly [name] showed increase pulmonary interstitial opacity. Patient being followed by Cardiology and Nephrology. Dialysis has been initiated with plans for outpatient treatments. Patient sustained a left hip fracture during admission and is s/p L hip IM Nailing.   Past Medical History:  Diagnosis Date   Abdominal aortic atherosclerosis with stenosis 06/29/2015   AKI (acute kidney injury) ([place]) 06/29/2015   Atherosclerosis of coronary artery without angina pectoris 04/21/2020   Atopic dermatitis 04/21/2020   Benign hypertension with CKD (chronic kidney disease) stage III ([place]) 04/21/2020   Bilateral impacted cerumen 08/21/2019   Chest pain 08/31/2020   Cholecystitis 06/29/2015   Cholelithiasis 06/29/2015   Claudication in peripheral vascular disease ([place]) 08/28/2019   Peripheral arterial disease   Colon cancer screening 04/21/2020   Dehydration with hyponatremia 06/29/2015   Diabetes mellitus ([place]) 07/15/2018   Diarrhea 04/21/2020   DKA (diabetic ketoacidoses) 06/29/2015   Dyslipidemia 08/30/2018   Elevated troponin    Epigastric pain 04/21/2020   Gastroesophageal reflux disease  04/21/2020   HTN (hypertension) 06/29/2015   Hyperglycemia due to type 2 diabetes mellitus ([place]) 04/21/2020   Hypertension    Hypertensive emergency 07/12/2020   Hypertensive heart disease without congestive heart failure 04/21/2020   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   Proptosis 04/21/2020   Proximal leg weakness 09/10/2018   Ptosis of left eyelid 09/10/2018   Renal artery stenosis ([place]) 12/17/2019   Renal artery stenosis   Standard chest x-Molly [name] abnormal 04/21/2020   Vitamin D deficiency 04/21/2020   Weight loss 04/21/2020     Subjective:   This NP Molly [name] reviewed medical records, received report from team, assessed the patient and then met at the patient's bedside with both she and her husband, Molly [name] to discuss diagnosis, prognosis, GOC, EOL wishes disposition and options.  Mrs. Klepacki is awake, alert and oriented x3. Complains of lower back pain and left hip pain. Intermittent grimacing and moaning observed. States her back is uncomfortable due to positioning and inability to mobilize due to hip surgery.    Concept of Palliative Care was introduced  as specialized medical care for people and their families living with serious illness.  It focuses on providing relief from the symptoms and stress of a serious illness.  The goal is to improve quality of life for both the patient and the family. Values and goals of care important to patient and family were  attempted to be elicited.  I created space and opportunity for patient and family to explore state of health prior to admission, thoughts, and feelings. Patient lives in the home with her husband. Prior to admission she was ambulatory with occasional walker/cane assistance. She used oxygen at night but did not require much throughout the day. Appetite has been better, but endorses previous issues with weight loss due malabsorption. Able to perform most ADLs independently.   We discussed Her current illness and what it means in the larger context of Her on-going co-morbidities. Natural disease trajectory and expectations were discussed.  Patient and husband verbalized understanding of current illness and co-morbidities. Molly [name] shares she is hopeful for some improvement in her health as "just when things began to improve, they are now bad again!" Emotional support provided. Molly [name] shares he has been working with patient to make sure she is improving sharing dietary adjustments and her therapies to increase strength and stamina.   Patient and husband share they are disappointed that she now has to undergo dialysis however, feels she is tolerating well. Molly [name] states she will do whatever is recommended to allow her an opportunity to thrive. I encouraged her to also consider her quality of life as she was very active a year ago and speaks somnolently to this has not been the case over the past 6 months. Her husband speaks for her and shares "she doesn't have the option to give up!" I shared if patient ever chose to discontinue care that it would not be giving up but continuing to focus on her her quality of life and focus her fight on doing things to her versus for her given she is the one that has to go through the physical involvement of interventions. Patient smiles and nods her head yes.   I discussed extensively patient's current left hip pain. She shares she is hopeful as she begin moving around more that her  pain will improve. We reviewed previous medications in addition to current regimen which is as needed Dilaudid. Patient has received 4 doses over the past 24 hours. Husband shares they are hesitant to take as patient becomes sleepy and shows no interest in eating or being away which he does not like. He speaks of the importance of her being awake during the day so she can "return to the [place] he knows!" Education provided on the use of pain medication. Discussed the use of scheduled Tylenol and as needed Dilaudid at a lower dose. Expressed consideration of re-starting Oxy IR versus the use of Dilaudid over the next 24 hours.   I discussed the importance of continued conversation with family and their medical providers regarding overall plan of care and treatment options, ensuring decisions are within the context of the patients values and GOCs.  Questions and concerns were addressed.  Patient and husband was encouraged to call with questions or concerns.  PMT will continue to support holistically as needed.  Life Review: Molly [name] lives in the home with her husband, Molly [name] of 7 years. They have 2 children however unclear if these are her biological or step-children (husband responds they have 2 children because they are married).  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 She enjoys crossword and stitching.    Objective:   Primary Diagnoses: Present on Admission:  Acute on chronic respiratory failure with hypoxia and hypercapnia (HCC)  Acute on chronic heart failure with preserved ejection fraction (HFpEF) (HCC)  Elevated troponin  HTN (hypertension)   Scheduled Meds:  acetaminophen  1,000 mg Oral TID   amiodarone  200 mg Oral Daily   Chlorhexidine Gluconate Cloth  6 each Topical Daily   Chlorhexidine Gluconate Cloth  6 each Topical Q0600   cilostazol  50 mg Oral BID   docusate sodium  100 mg Oral BID   ezetimibe  10 mg Oral Daily   famotidine  20 mg Oral Daily   feeding  supplement (NEPRO CARB STEADY)  237 mL Oral BID BM   hydrALAZINE  25 mg Oral Q8H   insulin aspart  0-6 Units Subcutaneous TID WC   isosorbide mononitrate  30 mg Oral Daily   lidocaine  1 patch Transdermal Q24H   metoprolol succinate  50 mg Oral Daily   multivitamin  1 tablet Oral QHS   pantoprazole  40 mg Oral QHS   rosuvastatin  10 mg Oral Daily   sodium chloride flush  3 mL Intravenous Q12H    Continuous Infusions:  sodium chloride     sodium chloride     sodium chloride     sodium chloride 10 mL/hr at 01/06/21 1409   heparin 600 Units/hr (01/08/21 1153)    PRN Meds: sodium chloride, sodium chloride, sodium chloride, acetaminophen, alteplase, heparin, hydrALAZINE, hydrocortisone cream, HYDROmorphone (DILAUDID) injection, HYDROmorphone (DILAUDID) injection, lidocaine (PF), lidocaine-prilocaine, loperamide, loratadine, menthol-cetylpyridinium **OR** phenol, metoCLOPramide **OR** metoCLOPramide (REGLAN) injection, Muscle Rub, naLOXone (NARCAN)  injection, pentafluoroprop-tetrafluoroeth, sodium chloride, sodium chloride flush  Allergies  Allergen Reactions   Lisinopril Other (See Comments)   Mercury Nausea And Vomiting    Review of Systems  Constitutional:  Positive for appetite change.  Musculoskeletal:        Left hip pain s/p surgery  Neurological:  Positive for weakness.  Unless otherwise noted, a complete review of systems is negative.  Physical Exam General: NAD, frail ill appearing Cardiovascular: regular rate and rhythm Pulmonary: diminished bilaterally  Abdomen: soft, nontender, + bowel sounds Extremities: no edema, no joint deformities Skin: no rashes, warm and dry Neurological: AAO x3, mood appropriate   Vital Signs:  BP (!) 107/40 (BP Location: Right Arm)   Pulse 78   Temp 98.7 F (37.1 C) (Oral)   Resp 18   Ht 5' 2"  (1.575 m)   Wt 64.9 kg   SpO2 95%   BMI 26.17 kg/m  Pain Scale: 0-10   Pain Score: 0-No pain  SpO2: SpO2: 95 % O2 Device:SpO2: 95  % O2 Flow Rate: .O2 Flow Rate (L/min): 2 L/min  IO: Intake/output summary: No intake or output data in the 24 hours ending 01/08/21 1708  LBM: Last BM Date: 01/05/21 Baseline Weight: Weight: 66.7 kg Most recent weight: Weight: 64.9 kg      Palliative Assessment/Data: PPS 30%   Advanced Care Planning:   Primary Decision Maker: NEXT OF KIN  Code Status/Advance Care Planning: Full code  A discussion was had today regarding advanced directives. Concepts specific to code status, artifical feeding and hydration, continued IV antibiotics and rehospitalization was had.    Patient reports her husband, Molly [name] is her Scientist, research (medical).   I discussed at length patient's full code status with consideration of her current  illness and co-morbidities. Husband states wishes for patient to remain a full code with full scope care. He wishes for any and all medical interventions to be provided to allow patient to live. He would not wish for her to be in a comatose state and if no ability to wean from life-support would then re-evaluate wishes. Patient verbalizes her agreement with her husband.   Patient and husband are clear in expressed goals to continue with all aggressive interventions and to remain a full code. They are remaining hopeful for some improvement. Anticipating continuing dialysis outpatient, SNF rehab, with a goal of patient eventually returning home.   Palliative Care services outpatient were explained and offered. Patient and family verbalized their understanding and awareness of palliative's goals and philosophy of care. They will consider outpatient support as recommended.    Assessment & Plan:   SUMMARY OF RECOMMENDATIONS   Full Code-as confirmed by patient and husband Continue with current plan of care, full scope, aggressive interventions.  Recommendations for outpatient palliative.  PMT will continue to support and follow as needed. Please call team line with urgent  needs.  Symptom Management:  Left Hip Pain Decrease Dilaudid to 0.25 mg PRN Scheduled Tylenol 1000 mg TID Would consider discontinuing Dilaudid over the next 24 hours and transition to Oxy IR 5 mg PRN  Lidocaine Patch  Miralax daily for constipation in the presence of Opioid use  Palliative Prophylaxis:  Bowel Regimen and Frequent Pain Assessment  Additional Recommendations (Limitations, Scope, Preferences): Full Scope Treatment  Psycho-social/Spiritual:  Desire for further Chaplaincy support: no Additional Recommendations:  Ongoing goals of care discussions   Prognosis:  Unable to determine  Discharge Planning:  To Be Determined with recommendations for outpatient palliative at minimum.    Patient and husband  expressed understanding and was in agreement with this plan.   Time In: 1615 Time Out: 1720 Time Total: 65 min.   Visit consisted of counseling and education dealing with the complex and emotionally intense issues of symptom management and palliative care in the setting of serious and potentially life-threatening illness.Greater than 50%  of this time was spent counseling and coordinating care related to the above assessment and plan.  Signed by:  Alda Lea, AGPCNP-BC Palliative Medicine Team  Phone: [phone] Pager: [phone] Amion: Bjorn Pippin   Thank you for allowing the Palliative Medicine Team to assist in the care of this patient. Please utilize secure chat with additional questions, if there is no response within 30 minutes please call the above phone number. Palliative Medicine Team providers are available by phone from 7am to 5pm daily and can be reached through the team cell phone.  Should this patient require assistance outside of these hours, please call the patient's attending physician.

## 2021-01-08 NOTE — Progress Notes (Signed)
PROGRESS NOTE    Molly [name]  [id number] DOB: 10-Jun-1950 DOA: 12/27/2020 PCP: [name], MD   Brief Narrative:  70 year old female with past medical history of hypertension, left renal artery stenosis ([id number]) diastolic CHF, CKD stage IV, CAD, PAD with bilateral SFA CTO's, anemia chronic disease, diabetes presented on 12/27/2020 with 4 days of chest pain.  BP was uncontrolled and spiked as high as 234/208 in the ED.  Chest x-ray consistent with pulmonary edema.  Patient briefly required BiPAP. Admitted to [facility] service with cardiology consulted.  Patient developed A. fib with RVR on 9/13.  Fell in the bathroom and left hip fracture. Kidney functions continue to worsen and is started on hemodialysis.  Assessment & Plan:   Principal Problem:   Acute on chronic respiratory failure with hypoxia and hypercapnia (HCC) Active Problems:   HTN (hypertension)   Type 2 diabetes mellitus with stage 3b chronic kidney disease, with long-term current use of insulin (HCC)   Elevated troponin   Acute on chronic heart failure with preserved ejection fraction (HFpEF) ([place])   CAP (community acquired pneumonia)   Anemia   Abnormal CT of the abdomen  Acute metabolic and uremic encephalopathy: Also multifactorial lethargic.   Some clinical symptoms are improving.  Avoiding morphine.  Avoiding other sedating agents.  Low-dose Dilaudid for surgical pain.  Some improvement with dialysis as per family.    Acute respiratory failure with hypoxia secondary to recurrent flash pulmonary edema/acute systolic congestive heart failure: Echo shows reduced ejection fraction to 40 to 45% compared to the previous.  Now being managed by dialysis.  Fall in hospital/left hip fracture: CT left hip indicates left hip fracture.  S/p cephalomedullary nail of the left hip fracture by Dr. Lucia Gaskins 01/06/2021.   Weightbearing as tolerated. Work with PT OT.  Refer to rehab. DVT prophylaxis, she is on heparin with plan to go  on therapeutic anticoagulation.  Hypertensive emergency with chest pain: Improved.  Currently on hydralazine 25 mg p.o. 3 times daily Imdur to 30 mg p.o. daily, Toprol-XL 50 mg p.o. daily. Blood pressure is lower after dialysis.  Continue to monitor.  Left renal artery stenosis: Has a history of 80 to 90% of proximal left renal artery stenosis.  Plan was to do angiogram and stent but due to rising creatinine, this plan was kept on hold.  Cardiology and nephrology following.  Further plans unclear at this point in time now that she has been started on dialysis and per nephrology, this is likely going to be long-term. If becomes ESRD, may not benefit with any stenting.  New onset A. fib with RVR: Developed overnight 12/28/2020.  She was initially started on amiodarone drip and converted back to sinus rhythm. Not on any anticoagulation due to slight drop in hemoglobin.  However hemoglobin has remained stable since 12/30/2020.  Patient also underwent EGD 01/05/2021, no signs of bleeding.  GI cleared her for anticoagulation.  She is cleared by orthopedics for anticoagulation.  Patient is scheduled to have AV fistula on Monday by vascular surgery.  Cardiology has started her on heparin now and will transition her to Eliquis after that procedure.  Possible gastroduodenitis: CT abdomen suggests possible gastroduodenitis.  Status post EGD 01/05/2021, no signs of gastroduodenitis.  Remains on PPI.  AKI on CKD stage IV/progressive renal failure/obstructive uropathy/left ureteral stone/left hydronephrosis/dross hematuria:  CT renal stone shows 6 mm of left ureteral stone causing obstruction/hydronephrosis.  Patient underwent cystoscopy with left ureteral stent placement on 12/30/2020.  TDC was placed  on 01/01/2021 and she received first session of dialysis and second on 01/03/2021.  Nephrology consulting with [facility] for permanent access.  They plan for AV fistula on 01/10/2021 and conversion of the [facility] as  well.  Community-acquired pneumonia: Completed 5 days of Rocephin on 12/31/2020.  Improved.  Elevated troponin/demand ischemia: In the setting of hypertensive crisis.  EKG without ischemic changes.  Likely demand ischemia. 397 > 376.  Cardiology on board.  No plan for intervention.  Type 2 diabetes mellitus: Recent hemoglobin A1c 8.2% on 11/27/2020.  Home regimen includes Humulin 70/30 mix 8 to 12 units daily.Blood sugar has remained controlled on SSI since then.  Dyslipidemia: Continue Zetia and Crestor.  PAD: Continue statin and Pletal.  Anemia of chronic disease: Hemoglobin has remained stable since 12/30/2020.  CT renal stone indicates possible gastritis or gastric ulcer.  GI on board.  GERD: Continue PPI.  Nutrition Status: Patient with poor oral intake.  Currently on renal diet.  Not eating even 25% of the meal.  At risk of severe protein calorie malnutrition. Will liberalize diet, changed to regular diet so that she can have some choices and flavor to start eating. Calorie count.  Goal of care: Patient with extensive issues, complications and now on hemodialysis.  Also with unrelenting pain issues, depression and frustrations. She will benefit with palliative care consultation, goal of care and symptom management.  Will consult palliative care.   DVT prophylaxis:    Code Status: Full Code  Family Communication: Patient's husband present at bedside.    Status is: Inpatient  Remains inpatient appropriate because:Ongoing diagnostic testing needed not appropriate for outpatient work up  Dispo: The patient is from: Home              Anticipated d/c is to: Skilled nursing facility.              Patient currently is not medically stable to d/c.   Difficult to place patient No        Estimated body mass index is 26.17 kg/m as calculated from the following:   Height as of this encounter: '5\' 2"'$  (1.575 m).   Weight as of this encounter: 64.9 kg.     Consultants:   Nephrology Cardiology Orthopedic Urology-signed off GI  Procedures:  As above  Antimicrobials:  Anti-infectives (From admission, onward)    Start     Dose/Rate Route Frequency Ordered Stop   01/07/21 0230  ceFAZolin (ANCEF) IVPB 2g/100 mL premix        2 g 200 mL/hr over 30 Minutes Intravenous Every 12 hours 01/06/21 1711 01/07/21 0252   01/06/21 2100  ceFAZolin (ANCEF) IVPB 2g/100 mL premix  Status:  Discontinued        2 g 200 mL/hr over 30 Minutes Intravenous Every 6 hours 01/06/21 1649 01/06/21 1711   01/06/21 1400  ceFAZolin (ANCEF) IVPB 2g/100 mL premix        2 g 200 mL/hr over 30 Minutes Intravenous On call to O.R. 01/05/21 1926 01/06/21 1441   12/31/20 1330  cefTRIAXone (ROCEPHIN) 1 g in sodium chloride 0.9 % 100 mL IVPB        1 g 200 mL/hr over 30 Minutes Intravenous  Once 12/31/20 1315 12/31/20 1350   12/31/20 1000  cefTRIAXone (ROCEPHIN) 1 g in sodium chloride 0.9 % 100 mL IVPB  Status:  Discontinued        1 g 200 mL/hr over 30 Minutes Intravenous Every 24 hours 12/30/20 1202 12/31/20 1315  12/27/20 1530  cefTRIAXone (ROCEPHIN) 2 g in sodium chloride 0.9 % 100 mL IVPB  Status:  Discontinued        2 g 200 mL/hr over 30 Minutes Intravenous Every 24 hours 12/27/20 1435 12/30/20 1202          Subjective: Patient seen and examined.  I went to examine her in hemodialysis unit, she will not open her eyes to talk. Came back to examine her on the medical floor.  Husband was at the bedside.  She was pretty sleepy.  Not keen to conversation.  She tells me she does not want to keep going like this.  On further questioning, she tells me she is suffering and hurting. Complains of left hip pain which is about 5-6 out of 10.  Denies any nausea vomiting.  Does not like any food.  Objective: Vitals:   01/08/21 1000 01/08/21 1030 01/08/21 1100 01/08/21 1528  BP: (!) 94/41 (!) 102/46 (!) 116/50 (!) 107/40  Pulse: 64 66 70 78  Resp: 20 10 (!) 9 18  Temp:    98.7 F (37.1  C)  TempSrc:    Oral  SpO2:    (!) 86%  Weight:      Height:       No intake or output data in the 24 hours ending 01/08/21 1549 Filed Weights   01/07/21 0500 01/08/21 0604 01/08/21 0831  Weight: 69.1 kg 69.8 kg 64.9 kg    Examination:  General: Frail.  Debilitated.  Chronically sick looking.  On minimum oxygen. She has exophthalmos cardiovascular: S1-S2 normal.  Regular rate rhythm. Respiratory: Bilateral clear. Gastrointestinal: Soft and nontender.  Bowel sounds present. Ext:  Right femoral temporary dialysis catheter present. Left lateral incision clean and dry.  Distal neurovascular status intact. Skin pigmentations both legs. Neuro: Mostly sleepy.  Not keen to conversation.  Sick looking.  Alert and awake on his stimulation.  Generalized weakness.     Data Reviewed: I have personally reviewed following labs and imaging studies  CBC: Recent Labs  Lab 01/04/21 0435 01/05/21 0500 01/06/21 0335 01/07/21 0229 01/08/21 0720  WBC 6.2 8.1 7.9 9.3 8.7  HGB 8.3* 8.3* 8.5* 9.4* 7.9*  HCT 26.8* 26.0* 26.2* 30.9* 25.8*  MCV 85.4 85.2 84.8 87.5 87.8  PLT 240 269 223 260 [id number]   Basic Metabolic Panel: Recent Labs  Lab 01/03/21 0803 01/03/21 1815 01/03/21 2053 01/04/21 0435 01/04/21 1732 01/05/21 0500 01/06/21 0335 01/07/21 0229 01/08/21 0720  NA 134*  --    < > 133*  --  133* 134* 136 135  K 3.6  --    < > 3.6  --  3.6 3.6 3.8 4.1  CL 93*  --    < > 96*  --  96* 98 101 99  CO2 24  --    < > 25  --  '26 28 25 25  '$ GLUCOSE 110*  --    < > 146*  --  122* 121* 132* 134*  BUN 48*  --    < > 30*  --  34* 18 26* 39*  CREATININE 4.89*  --    < > 3.39*  --  4.01* 2.90* 3.77* 4.83*  CALCIUM 8.6*  --    < > 8.0*  --  8.2* 8.0* 8.3* 8.3*  MG 2.0 1.9  --  1.9 1.9 1.9  --   --   --   PHOS  --   --    < > 3.0  --  2.9 2.9 4.0 4.4   < > = values in this interval not displayed.   GFR: Estimated Creatinine Clearance: 9.9 mL/min (A) (by C-G formula based on SCr of 4.83 mg/dL  (H)). Liver Function Tests: Recent Labs  Lab 01/03/21 0803 01/03/21 2053 01/04/21 0435 01/05/21 0500 01/06/21 0335 01/07/21 0229 01/08/21 0720  AST 114*  --   --  86*  --   --   --   ALT 100*  --   --  79*  --   --   --   ALKPHOS 93  --   --  91  --   --   --   BILITOT 0.7  --   --  0.6  --   --   --   PROT 6.7  --   --  6.1*  --   --   --   ALBUMIN 2.7*   < > 2.4* 2.4* 2.3* 2.5* 2.2*   < > = values in this interval not displayed.   No results for input(s): LIPASE, AMYLASE in the last 168 hours. No results for input(s): AMMONIA in the last 168 hours. Coagulation Profile: No results for input(s): INR, PROTIME in the last 168 hours. Cardiac Enzymes: No results for input(s): CKTOTAL, CKMB, CKMBINDEX, TROPONINI in the last 168 hours. BNP (last 3 results) No results for input(s): PROBNP in the last 8760 hours. HbA1C: No results for input(s): HGBA1C in the last 72 hours. CBG: Recent Labs  Lab 01/07/21 1124 01/07/21 1628 01/07/21 2113 01/08/21 0615 01/08/21 1211  GLUCAP 137* 127* 123* 117* 105*   Lipid Profile: No results for input(s): CHOL, HDL, LDLCALC, TRIG, CHOLHDL, LDLDIRECT in the last 72 hours. Thyroid Function Tests: No results for input(s): TSH, T4TOTAL, FREET4, T3FREE, THYROIDAB in the last 72 hours. Anemia Panel: No results for input(s): VITAMINB12, FOLATE, FERRITIN, TIBC, IRON, RETICCTPCT in the last 72 hours. Sepsis Labs: No results for input(s): PROCALCITON, LATICACIDVEN in the last 168 hours.   Recent Results (from the past 240 hour(s))  Surgical pcr screen     Status: None   Collection Time: 01/05/21  7:42 PM   Specimen: Nasal Mucosa; Nasal Swab  Result Value Ref Range Status   MRSA, PCR NEGATIVE NEGATIVE Final   Staphylococcus aureus NEGATIVE NEGATIVE Final    Comment: (NOTE) The Xpert SA Assay (FDA approved for NASAL specimens in patients 8 years of age and older), is one component of a comprehensive surveillance program. It is not intended to  diagnose infection nor to guide or monitor treatment. Performed at [facility], [place] [address], [place], [place] [address]        Radiology Studies: VAS [place] UPPER EXT VEIN MAPPING (PRE-OP AVF)  Result Date: 01/07/2021 UPPER EXTREMITY VEIN MAPPING Patient Name:  RAMI FARNUM  Date of Exam:   01/07/2021 Medical Rec #: [id number]     Accession #:    [id number] Date of Birth: 1951-02-21     Patient Gender: F Patient Age:   4 years Exam Location:  [facility] Procedure:      VAS [place] UPPER EXT VEIN MAPPING (PRE-OP AVF) Referring Phys: Melony Overly --------------------------------------------------------------------------------  Indications: Pre-access. Comparison Study: No previous exams Performing Technologist: Jody Hill RVT, RDMS  Examination Guidelines: A complete evaluation includes B-mode imaging, spectral Doppler, color Doppler, and power Doppler as needed of all accessible portions of each vessel. Bilateral testing is considered an integral part of a complete examination. Limited examinations for reoccurring indications may be  performed as noted. +-----------------+-------------+----------+-------------------------------+ Right Cephalic   Diameter (cm)Depth (cm)           Findings             +-----------------+-------------+----------+-------------------------------+ Shoulder             0.29        1.51                                   +-----------------+-------------+----------+-------------------------------+ Prox upper arm       0.26        0.57                                   +-----------------+-------------+----------+-------------------------------+ Mid upper arm        0.22        0.68                                   +-----------------+-------------+----------+-------------------------------+ Dist upper arm       0.20        0.64                                   +-----------------+-------------+----------+-------------------------------+  Antecubital fossa    0.35        0.26                                   +-----------------+-------------+----------+-------------------------------+ Prox forearm         0.26        0.46              branching            +-----------------+-------------+----------+-------------------------------+ Mid forearm          0.21        0.69                                   +-----------------+-------------+----------+-------------------------------+ Dist forearm         0.24        0.28                                   +-----------------+-------------+----------+-------------------------------+ Wrist                                   not visualized and IV placement +-----------------+-------------+----------+-------------------------------+ +-----------------+-------------+----------+-------------------------------+ Right Basilic    Diameter (cm)Depth (cm)           Findings             +-----------------+-------------+----------+-------------------------------+ Prox upper arm       0.23                                               +-----------------+-------------+----------+-------------------------------+ Mid upper arm        0.26                                               +-----------------+-------------+----------+-------------------------------+  Dist upper arm       0.18                                               +-----------------+-------------+----------+-------------------------------+ Antecubital fossa    0.16                          branching            +-----------------+-------------+----------+-------------------------------+ Prox forearm         0.12                                               +-----------------+-------------+----------+-------------------------------+ Mid forearm          0.08                                               +-----------------+-------------+----------+-------------------------------+ Distal forearm                                   not visualized          +-----------------+-------------+----------+-------------------------------+ Wrist                                   not visualized and IV placement +-----------------+-------------+----------+-------------------------------+ Subcutaneous edema noted from mid upper arm to mid forearm. +-----------------+-------------+----------+----------------------+ Left Cephalic    Diameter (cm)Depth (cm)       Findings        +-----------------+-------------+----------+----------------------+ Shoulder             0.34        1.14                          +-----------------+-------------+----------+----------------------+ Prox upper arm       0.36        0.97                          +-----------------+-------------+----------+----------------------+ Mid upper arm        0.39        0.99                          +-----------------+-------------+----------+----------------------+ Dist upper arm       0.42        0.91                          +-----------------+-------------+----------+----------------------+ Antecubital fossa    0.58        0.37   branching and thrombus +-----------------+-------------+----------+----------------------+ Prox forearm         0.12        0.73         branching        +-----------------+-------------+----------+----------------------+ Mid forearm          0.07        0.37                          +-----------------+-------------+----------+----------------------+  Dist forearm                                not visualized     +-----------------+-------------+----------+----------------------+ Wrist                                       not visualized     +-----------------+-------------+----------+----------------------+ +-----------------+-------------+----------+---------+ Left Basilic     Diameter (cm)Depth (cm)Findings  +-----------------+-------------+----------+---------+ Prox upper  arm       0.34                         +-----------------+-------------+----------+---------+ Mid upper arm        0.23                         +-----------------+-------------+----------+---------+ Dist upper arm       0.16                         +-----------------+-------------+----------+---------+ Antecubital fossa    0.15               branching +-----------------+-------------+----------+---------+ Prox forearm         0.13                         +-----------------+-------------+----------+---------+ Mid forearm          0.12                         +-----------------+-------------+----------+---------+ Distal forearm       0.13                         +-----------------+-------------+----------+---------+ Wrist                0.12                         +-----------------+-------------+----------+---------+ Subcutaneous edema in area of AC fossa Summary:  Left: Age indeterminate SVT seen in cephalic vein in area of AC       fossa. *See table(s) above for measurements and observations.  Diagnosing physician:    Preliminary     Scheduled Meds:  amiodarone  200 mg Oral Daily   Chlorhexidine Gluconate Cloth  6 each Topical Daily   Chlorhexidine Gluconate Cloth  6 each Topical Q0600   cilostazol  50 mg Oral BID   docusate sodium  100 mg Oral BID   ezetimibe  10 mg Oral Daily   famotidine  20 mg Oral Daily   feeding supplement (NEPRO CARB STEADY)  237 mL Oral BID BM   hydrALAZINE  25 mg Oral Q8H   insulin aspart  0-6 Units Subcutaneous TID WC   isosorbide mononitrate  30 mg Oral Daily   lidocaine  1 patch Transdermal Q24H   metoprolol succinate  50 mg Oral Daily   multivitamin  1 tablet Oral QHS   pantoprazole  40 mg Oral QHS   rosuvastatin  10 mg Oral Daily   sodium chloride flush  3 mL Intravenous Q12H   Continuous Infusions:  sodium chloride     sodium chloride     sodium chloride     sodium chloride  10 mL/hr at 01/06/21 1409   heparin 600  Units/hr (01/08/21 1153)     LOS: 12 days   Time spent: 30 minutes   Barb Merino, MD [facility]  01/08/2021, 3:49 PM

## 2021-01-08 NOTE — Plan of Care (Signed)

## 2021-01-08 NOTE — Progress Notes (Signed)
ANTICOAGULATION CONSULT NOTE  Pharmacy Consult for Heparin Indication: atrial fibrillation  Allergies  Allergen Reactions   Lisinopril Other (See Comments)   [place] Nausea And Vomiting    Patient Measurements: Height: '5\' 2"'$  (157.5 cm) Weight: 64.9 kg (143 lb 1.3 oz) IBW/kg (Calculated) : 50.1  Vital Signs: Temp: 98.7 F (37.1 C) (09/24 1528) Temp Source: Oral (09/24 1528) BP: 107/40 (09/24 1528) Pulse Rate: 78 (09/24 1528)  Labs: Recent Labs    01/06/21 0335 01/07/21 0229 01/07/21 2200 01/08/21 0720 01/08/21 1600  HGB 8.5* 9.4*  --  7.9*  --   HCT 26.2* 30.9*  --  25.8*  --   PLT 223 260  --  279  --   HEPARINUNFRC  --   --  0.88* 0.85* 0.92*  CREATININE 2.90* 3.77*  --  4.83*  --      Estimated Creatinine Clearance: 9.9 mL/min (A) (by C-G formula based on SCr of 4.83 mg/dL (H)).   Assessment: 70 y.o. female with new onset Afib started on IV heparin earlier this admit.  Patient had a nosebleed on 9/14 and therapeutic heparin was held since.  GI cleared patient to resume anticoagulation post EGD on 9/21 and she is also s/p IM nail L hip on 9/22.  Patient needs an AV fistula tentatively scheduled for 9/26, so Cards asked pharmacy to resume IV heparin.  Heparin level remains elevated at 0.92, on 600 units/hr. Confirmed drawn from opposite side - no s/sx of bleeding.   Goal of Therapy:  Heparin level 0.3-0.5 units/ml Monitor platelets by anticoagulation protocol: Yes   Plan:  -Hold heparin for 1 hours and reduce to 500 units/hr -Heparin level in 8 hours and daily with CBC daily -Monitor closely for s/sx of bleed   Antonietta Jewel, PharmD, [place] Pharmacist  Phone: [phone] 01/08/2021 5:50 PM  Please check AMION for all [place] phone numbers After 10:00 PM, call [place] [phone]

## 2021-01-09 ENCOUNTER — Encounter (HOSPITAL_COMMUNITY): Payer: Self-pay | Admitting: Orthopaedic Surgery

## 2021-01-09 DIAGNOSIS — I480 Paroxysmal atrial fibrillation: Secondary | ICD-10-CM | POA: Diagnosis not present

## 2021-01-09 DIAGNOSIS — Z7189 Other specified counseling: Secondary | ICD-10-CM

## 2021-01-09 DIAGNOSIS — R778 Other specified abnormalities of plasma proteins: Secondary | ICD-10-CM | POA: Diagnosis not present

## 2021-01-09 DIAGNOSIS — G8918 Other acute postprocedural pain: Secondary | ICD-10-CM

## 2021-01-09 DIAGNOSIS — M25552 Pain in left hip: Secondary | ICD-10-CM

## 2021-01-09 DIAGNOSIS — Z992 Dependence on renal dialysis: Secondary | ICD-10-CM | POA: Diagnosis not present

## 2021-01-09 DIAGNOSIS — N186 End stage renal disease: Secondary | ICD-10-CM | POA: Diagnosis not present

## 2021-01-09 DIAGNOSIS — J9622 Acute and chronic respiratory failure with hypercapnia: Secondary | ICD-10-CM | POA: Diagnosis not present

## 2021-01-09 DIAGNOSIS — J9621 Acute and chronic respiratory failure with hypoxia: Secondary | ICD-10-CM | POA: Diagnosis not present

## 2021-01-09 DIAGNOSIS — I5033 Acute on chronic diastolic (congestive) heart failure: Secondary | ICD-10-CM | POA: Diagnosis not present

## 2021-01-09 LAB — GLUCOSE, CAPILLARY
Glucose-Capillary: 124 mg/dL — ABNORMAL HIGH (ref 70–99)
Glucose-Capillary: 137 mg/dL — ABNORMAL HIGH (ref 70–99)
Glucose-Capillary: 160 mg/dL — ABNORMAL HIGH (ref 70–99)
Glucose-Capillary: 132 mg/dL — ABNORMAL HIGH (ref 70–99)

## 2021-01-09 LAB — RENAL FUNCTION PANEL
Albumin: 2.1 g/dL — ABNORMAL LOW (ref 3.5–5.0)
Anion gap: 9 (ref 5–15)
BUN: 25 mg/dL — ABNORMAL HIGH (ref 8–23)
CO2: 28 mmol/L (ref 22–32)
Calcium: 8.1 mg/dL — ABNORMAL LOW (ref 8.9–10.3)
Chloride: 97 mmol/L — ABNORMAL LOW (ref 98–111)
Creatinine, Ser: 3.67 mg/dL — ABNORMAL HIGH (ref 0.44–1.00)
GFR, Estimated: 13 mL/min — ABNORMAL LOW (ref >60)
Glucose, Bld: 137 mg/dL — ABNORMAL HIGH (ref 70–99)
Phosphorus: 3.8 mg/dL (ref 2.5–4.6)
Potassium: 3.5 mmol/L (ref 3.5–5.1)
Sodium: 134 mmol/L — ABNORMAL LOW (ref 135–145)

## 2021-01-09 LAB — CBC
HCT: 23.4 % — ABNORMAL LOW (ref 36.0–46.0)
Hemoglobin: 7.2 g/dL — ABNORMAL LOW (ref 12.0–15.0)
MCH: 26.9 pg (ref 26.0–34.0)
MCHC: 30.8 g/dL (ref 30.0–36.0)
MCV: 87.3 fL (ref 80.0–100.0)
Platelets: 235 10*3/uL (ref 150–400)
RBC: 2.68 MIL/uL — ABNORMAL LOW (ref 3.87–5.11)
RDW: 16.9 % — ABNORMAL HIGH (ref 11.5–15.5)
WBC: 9.9 10*3/uL (ref 4.0–10.5)
nRBC: 0 % (ref 0.0–0.2)

## 2021-01-09 LAB — HEPARIN LEVEL (UNFRACTIONATED)
Heparin Unfractionated: 0.35 IU/mL (ref 0.30–0.70)
Heparin Unfractionated: 0.33 IU/mL (ref 0.30–0.70)

## 2021-01-09 MED ORDER — FLUOXETINE HCL 10 MG PO CAPS
10.0000 mg | ORAL_CAPSULE | Freq: Every day | ORAL | Status: DC
  Administered 2021-01-09 – 2021-01-17 (×8): 10 mg via ORAL
  Filled 2021-01-09 (×10): qty 1

## 2021-01-09 MED ORDER — OXYCODONE HCL 5 MG PO TABS
5.0000 mg | ORAL_TABLET | Freq: Four times a day (QID) | ORAL | Status: DC | PRN
  Administered 2021-01-09 – 2021-01-15 (×7): 5 mg via ORAL
  Filled 2021-01-09 (×9): qty 1

## 2021-01-09 MED ORDER — DICLOFENAC SODIUM 1 % EX GEL
2.0000 g | Freq: Four times a day (QID) | CUTANEOUS | Status: DC
  Administered 2021-01-09 – 2021-01-17 (×25): 2 g via TOPICAL
  Filled 2021-01-09: qty 100

## 2021-01-09 NOTE — Progress Notes (Signed)
PROGRESS NOTE    Molly [name]  [id number] DOB: 08-15-1950 DOA: 12/27/2020 PCP: [name], MD   Brief Narrative:  70 year old female with past medical history of hypertension, left renal artery stenosis ([id number]) diastolic CHF, CKD stage IV, CAD, PAD with bilateral SFA CTO's, anemia chronic disease, diabetes presented on 12/27/2020 with 4 days of chest pain.  BP was uncontrolled and spiked as high as 234/208 in the ED.  Chest x-ray consistent with pulmonary edema.  Patient briefly required BiPAP. Admitted to [facility] with cardiology consulted.  Patient developed A. fib with RVR on 9/13.  Fell in the bathroom and left hip fracture. Kidney functions continue to worsen and started on hemodialysis.  Hospital course complicated with ongoing depression, pain issues and poor appetite.  Assessment & Plan:   Principal Problem:   Acute on chronic respiratory failure with hypoxia and hypercapnia (HCC) Active Problems:   HTN (hypertension)   Type 2 diabetes mellitus with stage 3b chronic kidney disease, with long-term current use of insulin (HCC)   Elevated troponin   Acute on chronic heart failure with preserved ejection fraction (HFpEF) ([place])   CAP (community acquired pneumonia)   Anemia   Abnormal CT of the abdomen  Acute metabolic and uremic encephalopathy: Also multifactorial lethargy.  Clinically improving. Some clinical symptoms are improving.  Avoiding morphine.  Avoiding other sedating agents.  Low-dose Dilaudid for surgical pain.  Some improvement with dialysis as per family.    Acute respiratory failure with hypoxia secondary to recurrent flash pulmonary edema/acute systolic congestive heart failure: Echo shows reduced ejection fraction to 40 to 45% compared to the previous.  Now being managed by dialysis.  Stable.  Fall in hospital/left hip fracture: CT left hip indicates left hip fracture.  S/p cephalomedullary nail of the left hip fracture by Dr. Lucia Gaskins 01/06/2021.    Weightbearing as tolerated. Work with PT OT.  Refer to rehab. DVT prophylaxis, she is on heparin with plan to go on therapeutic anticoagulation.  Hypertensive emergency with chest pain: Improved.  Currently on hydralazine 25 mg p.o. 3 times daily Imdur to 30 mg p.o. daily, Toprol-XL 50 mg p.o. daily.  Left renal artery stenosis: Has a history of 80 to 90% of proximal left renal artery stenosis.  Plan was to do angiogram and stent but due to rising creatinine, this plan was kept on hold.  Cardiology and nephrology following.  Further plans unclear at this point in time now that she has been started on dialysis and per nephrology, this is likely going to be long-term. If becomes ESRD, may not benefit with any stenting.  New onset A. fib with RVR: Developed overnight 12/28/2020.  She was initially started on amiodarone drip and converted back to sinus rhythm. Not on any anticoagulation due to slight drop in hemoglobin.  However hemoglobin has remained stable since 12/30/2020.  Patient also underwent EGD 01/05/2021, no signs of bleeding.  GI cleared her for anticoagulation.  She is cleared by orthopedics for anticoagulation.  Patient is scheduled to have AV fistula on Monday by vascular surgery.  Cardiology has started her on heparin now and will transition her to Eliquis after that procedure.  Possible gastroduodenitis: CT abdomen suggests possible gastroduodenitis.  Status post EGD 01/05/2021, no signs of gastroduodenitis.  Remains on PPI.  AKI on CKD stage IV/progressive renal failure/obstructive uropathy/left ureteral stone/left hydronephrosis/dross hematuria:  CT renal stone shows 6 mm of left ureteral stone causing obstruction/hydronephrosis.  Patient underwent cystoscopy with left ureteral stent placement on 12/30/2020.  TDC was placed on 01/01/2021 and she received first session of dialysis and second on 01/03/2021.  Nephrology consulting with [facility] for permanent access.  They plan for AV fistula on  01/10/2021 and conversion of the [facility] as well.  Community-acquired pneumonia: Completed 5 days of Rocephin on 12/31/2020.  Improved.  Elevated troponin/demand ischemia: In the setting of hypertensive crisis.  EKG without ischemic changes.  Likely demand ischemia. 397 > 376.  Cardiology on board.  No plan for intervention.  Type 2 diabetes mellitus: Recent hemoglobin A1c 8.2% on 11/27/2020.  Home regimen includes Humulin 70/30 mix 8 to 12 units daily.Blood sugar has remained controlled on SSI since then. Once her appetite improves, will add long-acting insulin.  Dyslipidemia: Continue Zetia and Crestor.  PAD: Continue statin and Pletal.  Anemia of chronic disease: Hemoglobin has remained stable since 12/30/2020.  CT renal stone indicates possible gastritis or gastric ulcer.  GI on board.  Hemoglobin more than 7.  GERD: Continue PPI.  Nutrition Status: Patient with poor oral intake.  Currently on renal diet.  Not eating even 25% of the meal.  At risk of severe protein calorie malnutrition. Will liberalize diet, changed to regular diet so that she can have some choices and flavor to start eating. Calorie count.  Goal of care: Patient with extensive issues, complications and now on hemodialysis.  Also with unrelenting pain issues, depression and frustrations. She will benefit with palliative care consultation, goal of care and symptom management.  Palliative care to see today. Will start patient on oxycodone that will be long-acting.  Only using Dilaudid so far. Start patient on Prozac low-dose, will help. Changing to regular diet was helpful to improve appetite. Discontinue right groin femoral catheter, not needing any more and start mobilizing.  DVT prophylaxis:    Code Status: Full Code  Family Communication: Patient's husband present at bedside.    Status is: Inpatient  Remains inpatient appropriate because:Ongoing diagnostic testing needed not appropriate for outpatient work  up  Dispo: The patient is from: Home              Anticipated d/c is to: Skilled nursing facility.              Patient currently is not medically stable to d/c.   Difficult to place patient No        Estimated body mass index is 25.44 kg/m as calculated from the following:   Height as of this encounter: '5\' 2"'$  (1.575 m).   Weight as of this encounter: 63.1 kg.     Consultants:  Nephrology Cardiology Orthopedic Urology-signed off GI Palliative  Procedures:  As above  Antimicrobials:  Anti-infectives (From admission, onward)    Start     Dose/Rate Route Frequency Ordered Stop   01/07/21 0230  ceFAZolin (ANCEF) IVPB 2g/100 mL premix        2 g 200 mL/hr over 30 Minutes Intravenous Every 12 hours 01/06/21 1711 01/07/21 0252   01/06/21 2100  ceFAZolin (ANCEF) IVPB 2g/100 mL premix  Status:  Discontinued        2 g 200 mL/hr over 30 Minutes Intravenous Every 6 hours 01/06/21 1649 01/06/21 1711   01/06/21 1400  ceFAZolin (ANCEF) IVPB 2g/100 mL premix        2 g 200 mL/hr over 30 Minutes Intravenous On call to O.R. 01/05/21 1926 01/06/21 1441   12/31/20 1330  cefTRIAXone (ROCEPHIN) 1 g in sodium chloride 0.9 % 100 mL IVPB  1 g 200 mL/hr over 30 Minutes Intravenous  Once 12/31/20 1315 12/31/20 1350   12/31/20 1000  cefTRIAXone (ROCEPHIN) 1 g in sodium chloride 0.9 % 100 mL IVPB  Status:  Discontinued        1 g 200 mL/hr over 30 Minutes Intravenous Every 24 hours 12/30/20 1202 12/31/20 1315   12/27/20 1530  cefTRIAXone (ROCEPHIN) 2 g in sodium chloride 0.9 % 100 mL IVPB  Status:  Discontinued        2 g 200 mL/hr over 30 Minutes Intravenous Every 24 hours 12/27/20 1435 12/30/20 1202          Subjective: Patient seen and examined.  Today she was more alert.  She wanted to talk to me about all the problems she is going through.  She told me that she is frustrated and depressed which is appropriate for her.  We discussed about different options.  She was happy  to have some choices of meal with regular diet. Her husband talk about steroid injection on her left hip, we advised that thus not a good choice for immediate surgical wound. She wants to get out of the bed and sit in chair.  We will discontinue femoral catheter. Patient is agreeable to start using oral pain medications and also agreeable to start antidepressants. Discussed with palliative care provider at bedside and family was appreciative.  Objective: Vitals:   01/08/21 1115 01/08/21 1528 01/08/21 2010 01/09/21 0606  BP: (!) 143/59 (!) 107/40 (!) 122/45 (!) 120/49  Pulse: 75 78 72   Resp:  18 18   Temp: 98.4 F (36.9 C) 98.7 F (37.1 C) 98.8 F (37.1 C)   TempSrc: Oral Oral Oral   SpO2:  95% 100%   Weight: 63.1 kg     Height:        Intake/Output Summary (Last 24 hours) at 01/09/2021 1151 Last data filed at 01/08/2021 1600 Gross per 24 hour  Intake 201.02 ml  Output --  Net 201.02 ml   Filed Weights   01/08/21 0604 01/08/21 0831 01/08/21 1115  Weight: 69.8 kg 64.9 kg 63.1 kg    Examination:  General: Frail.  Debilitated.  Chronically sick looking.  On minimum oxygen. cardiovascular: S1-S2 normal.  Regular rate rhythm. Respiratory: Bilateral clear. Gastrointestinal: Soft and nontender.  Bowel sounds present. Ext:  Right femoral temporary dialysis catheter present. Left lateral incision clean and dry.  Distal neurovascular status intact. Skin pigmentations both legs. Neuro: No focal deficits today.  Alert awake and oriented. Emotionally very labile and crying.  Tearful.     Data Reviewed: I have personally reviewed following labs and imaging studies  CBC: Recent Labs  Lab 01/05/21 0500 01/06/21 0335 01/07/21 0229 01/08/21 0720 01/09/21 0326  WBC 8.1 7.9 9.3 8.7 9.9  HGB 8.3* 8.5* 9.4* 7.9* 7.2*  HCT 26.0* 26.2* 30.9* 25.8* 23.4*  MCV 85.2 84.8 87.5 87.8 87.3  PLT 269 223 260 279 [id number]   Basic Metabolic Panel: Recent Labs  Lab 01/03/21 0803  01/03/21 1815 01/03/21 2053 01/04/21 0435 01/04/21 1732 01/05/21 0500 01/06/21 0335 01/07/21 0229 01/08/21 0720 01/09/21 0326  NA 134*  --    < > 133*  --  133* 134* 136 135 134*  K 3.6  --    < > 3.6  --  3.6 3.6 3.8 4.1 3.5  CL 93*  --    < > 96*  --  96* 98 101 99 97*  CO2 24  --    < >  25  --  '26 28 25 25 28  '$ GLUCOSE 110*  --    < > 146*  --  122* 121* 132* 134* 137*  BUN 48*  --    < > 30*  --  34* 18 26* 39* 25*  CREATININE 4.89*  --    < > 3.39*  --  4.01* 2.90* 3.77* 4.83* 3.67*  CALCIUM 8.6*  --    < > 8.0*  --  8.2* 8.0* 8.3* 8.3* 8.1*  MG 2.0 1.9  --  1.9 1.9 1.9  --   --   --   --   PHOS  --   --    < > 3.0  --  2.9 2.9 4.0 4.4 3.8   < > = values in this interval not displayed.   GFR: Estimated Creatinine Clearance: 12.8 mL/min (A) (by C-G formula based on SCr of 3.67 mg/dL (H)). Liver Function Tests: Recent Labs  Lab 01/03/21 0803 01/03/21 2053 01/05/21 0500 01/06/21 0335 01/07/21 0229 01/08/21 0720 01/09/21 0326  AST 114*  --  86*  --   --   --   --   ALT 100*  --  79*  --   --   --   --   ALKPHOS 93  --  91  --   --   --   --   BILITOT 0.7  --  0.6  --   --   --   --   PROT 6.7  --  6.1*  --   --   --   --   ALBUMIN 2.7*   < > 2.4* 2.3* 2.5* 2.2* 2.1*   < > = values in this interval not displayed.   No results for input(s): LIPASE, AMYLASE in the last 168 hours. No results for input(s): AMMONIA in the last 168 hours. Coagulation Profile: No results for input(s): INR, PROTIME in the last 168 hours. Cardiac Enzymes: No results for input(s): CKTOTAL, CKMB, CKMBINDEX, TROPONINI in the last 168 hours. BNP (last 3 results) No results for input(s): PROBNP in the last 8760 hours. HbA1C: No results for input(s): HGBA1C in the last 72 hours. CBG: Recent Labs  Lab 01/08/21 0615 01/08/21 1211 01/08/21 1625 01/08/21 2042 01/09/21 0749  GLUCAP 117* 105* 110* 165* 124*   Lipid Profile: No results for input(s): CHOL, HDL, LDLCALC, TRIG, CHOLHDL, LDLDIRECT  in the last 72 hours. Thyroid Function Tests: No results for input(s): TSH, T4TOTAL, FREET4, T3FREE, THYROIDAB in the last 72 hours. Anemia Panel: No results for input(s): VITAMINB12, FOLATE, FERRITIN, TIBC, IRON, RETICCTPCT in the last 72 hours. Sepsis Labs: No results for input(s): PROCALCITON, LATICACIDVEN in the last 168 hours.   Recent Results (from the past 240 hour(s))  Surgical pcr screen     Status: None   Collection Time: 01/05/21  7:42 PM   Specimen: Nasal Mucosa; Nasal Swab  Result Value Ref Range Status   MRSA, PCR NEGATIVE NEGATIVE Final   Staphylococcus aureus NEGATIVE NEGATIVE Final    Comment: (NOTE) The Xpert SA Assay (FDA approved for NASAL specimens in patients 34 years of age and older), is one component of a comprehensive surveillance program. It is not intended to diagnose infection nor to guide or monitor treatment. Performed at [facility], [place] [address] [place], [place] [address]        Radiology Studies: VAS [place] UPPER EXT VEIN MAPPING (PRE-OP AVF)  Result Date: 01/09/2021 UPPER EXTREMITY VEIN MAPPING Patient Name:  [name]  Date of Exam:   01/07/2021 Medical Rec #: [id number]     Accession #:    [id number] Date of Birth: Nov 14, 1950     Patient Gender: F Patient Age:   47 years Exam Location:  [facility] Procedure:      VAS [place] UPPER EXT VEIN MAPPING (PRE-OP AVF) Referring Phys: Melony Overly --------------------------------------------------------------------------------  Indications: Pre-access. Comparison Study: No previous exams Performing Technologist: Jody Hill RVT, RDMS  Examination Guidelines: A complete evaluation includes B-mode imaging, spectral Doppler, color Doppler, and power Doppler as needed of all accessible portions of each vessel. Bilateral testing is considered an integral part of a complete examination. Limited examinations for reoccurring indications may be performed as noted.  +-----------------+-------------+----------+-------------------------------+ Right Cephalic   Diameter (cm)Depth (cm)           Findings             +-----------------+-------------+----------+-------------------------------+ Shoulder             0.29        1.51                                   +-----------------+-------------+----------+-------------------------------+ Prox upper arm       0.26        0.57                                   +-----------------+-------------+----------+-------------------------------+ Mid upper arm        0.22        0.68                                   +-----------------+-------------+----------+-------------------------------+ Dist upper arm       0.20        0.64                                   +-----------------+-------------+----------+-------------------------------+ Antecubital fossa    0.35        0.26                                   +-----------------+-------------+----------+-------------------------------+ Prox forearm         0.26        0.46              branching            +-----------------+-------------+----------+-------------------------------+ Mid forearm          0.21        0.69                                   +-----------------+-------------+----------+-------------------------------+ Dist forearm         0.24        0.28                                   +-----------------+-------------+----------+-------------------------------+ Wrist  not visualized and IV placement +-----------------+-------------+----------+-------------------------------+ +-----------------+-------------+----------+-------------------------------+ Right Basilic    Diameter (cm)Depth (cm)           Findings             +-----------------+-------------+----------+-------------------------------+ Prox upper arm       0.23                                                +-----------------+-------------+----------+-------------------------------+ Mid upper arm        0.26                                               +-----------------+-------------+----------+-------------------------------+ Dist upper arm       0.18                                               +-----------------+-------------+----------+-------------------------------+ Antecubital fossa    0.16                          branching            +-----------------+-------------+----------+-------------------------------+ Prox forearm         0.12                                               +-----------------+-------------+----------+-------------------------------+ Mid forearm          0.08                                               +-----------------+-------------+----------+-------------------------------+ Distal forearm                                  not visualized          +-----------------+-------------+----------+-------------------------------+ Wrist                                   not visualized and IV placement +-----------------+-------------+----------+-------------------------------+ Subcutaneous edema noted from mid upper arm to mid forearm. +-----------------+-------------+----------+----------------------+ Left Cephalic    Diameter (cm)Depth (cm)       Findings        +-----------------+-------------+----------+----------------------+ Shoulder             0.34        1.14                          +-----------------+-------------+----------+----------------------+ Prox upper arm       0.36        0.97                          +-----------------+-------------+----------+----------------------+ Mid upper arm        0.39  0.99                          +-----------------+-------------+----------+----------------------+ Dist upper arm       0.42        0.91                           +-----------------+-------------+----------+----------------------+ Antecubital fossa    0.58        0.37   branching and thrombus +-----------------+-------------+----------+----------------------+ Prox forearm         0.12        0.73         branching        +-----------------+-------------+----------+----------------------+ Mid forearm          0.07        0.37                          +-----------------+-------------+----------+----------------------+ Dist forearm                                not visualized     +-----------------+-------------+----------+----------------------+ Wrist                                       not visualized     +-----------------+-------------+----------+----------------------+ +-----------------+-------------+----------+---------+ Left Basilic     Diameter (cm)Depth (cm)Findings  +-----------------+-------------+----------+---------+ Prox upper arm       0.34                         +-----------------+-------------+----------+---------+ Mid upper arm        0.23                         +-----------------+-------------+----------+---------+ Dist upper arm       0.16                         +-----------------+-------------+----------+---------+ Antecubital fossa    0.15               branching +-----------------+-------------+----------+---------+ Prox forearm         0.13                         +-----------------+-------------+----------+---------+ Mid forearm          0.12                         +-----------------+-------------+----------+---------+ Distal forearm       0.13                         +-----------------+-------------+----------+---------+ Wrist                0.12                         +-----------------+-------------+----------+---------+ Subcutaneous edema in area of AC fossa Summary:  Left: Age indeterminate SVT seen in cephalic vein in area of AC       fossa. *See table(s) above for  measurements and observations.  Diagnosing physician: Servando Snare MD Electronically signed by Servando Snare MD on 01/09/2021 at  11:18:42 AM.    Final     Scheduled Meds:  acetaminophen  1,000 mg Oral TID   amiodarone  200 mg Oral Daily   Chlorhexidine Gluconate Cloth  6 each Topical Daily   Chlorhexidine Gluconate Cloth  6 each Topical Q0600   cilostazol  50 mg Oral BID   diclofenac Sodium  2 g Topical QID   docusate sodium  100 mg Oral BID   ezetimibe  10 mg Oral Daily   famotidine  20 mg Oral Daily   feeding supplement (NEPRO CARB STEADY)  237 mL Oral BID BM   FLUoxetine  10 mg Oral Daily   hydrALAZINE  25 mg Oral Q8H   insulin aspart  0-6 Units Subcutaneous TID WC   isosorbide mononitrate  30 mg Oral Daily   lidocaine  1 patch Transdermal Q24H   metoprolol succinate  50 mg Oral Daily   multivitamin  1 tablet Oral QHS   pantoprazole  40 mg Oral QHS   rosuvastatin  10 mg Oral Daily   sodium chloride flush  3 mL Intravenous Q12H   Continuous Infusions:  sodium chloride     sodium chloride     sodium chloride     sodium chloride 10 mL/hr at 01/06/21 1409   heparin 500 Units/hr (01/08/21 2208)     LOS: 13 days   Time spent: 30 minutes   Barb Merino, MD [facility]  01/09/2021, 11:51 AM

## 2021-01-09 NOTE — Progress Notes (Signed)
[name] ROUNDING NOTE   Subjective:   Interval History: 70 year old lady with history of hypertension left renal artery stenosis [id number] diastolic dysfunction chronic kidney disease stage IV peripheral artery disease bilateral  superficial femoral artery obstruction.  Anemia, diabetes.  Presented 12/27/2020 with chest pain.  Was also found to have uncontrolled hypertension.  Blood pressure 120/49 pulse 76 temperature afebrile.  Last dialysis 9/21 with 2 L.  Patient received dialysis 01/08/2021 with 1.8 L removed.  This is off schedule as there is high dialysis patient volume.  Next dialysis treatment 05/13/2020  Sodium 134 potassium 3.5 chloride 97 CO2 28 BUN 25 creatinine 3.67 glucose 137 hemoglobin 10.2   minimal urine output  Objective:  Vital signs in last 24 hours:  Temp:  [98.4 F (36.9 C)-98.8 F (37.1 C)] 98.8 F (37.1 C) (09/24 2010) Pulse Rate:  [64-78] 72 (09/24 2010) Resp:  [9-20] 18 (09/24 2010) BP: (94-143)/(40-62) 120/49 (09/25 0606) SpO2:  [95 %-100 %] 100 % (09/24 2010) Weight:  [63.1 kg] 63.1 kg (09/24 1115)  Weight change: -4.9 kg Filed Weights   01/08/21 0604 01/08/21 0831 01/08/21 1115  Weight: 69.8 kg 64.9 kg 63.1 kg    Intake/Output: I/O last 3 completed shifts: In: 201 [I.V.:201] Out: 1851 [Other:1851]   Intake/Output this shift:  No intake/output data recorded.  Gen: in distress, yelling, laying flat in bed CVS:RRR, no murmurs Resp:crackles bibasilar, normal WOB [id number], nontender Ext:no edema Neuro: not orientable, yelling, moving all ext spontaneously   Basic Metabolic Panel: Recent Labs  Lab 01/03/21 0803 01/03/21 1815 01/03/21 2053 01/04/21 0435 01/04/21 1732 01/05/21 0500 01/06/21 0335 01/07/21 0229 01/08/21 0720 01/09/21 0326  NA 134*  --    < > 133*  --  133* 134* 136 135 134*  K 3.6  --    < > 3.6  --  3.6 3.6 3.8 4.1 3.5  CL 93*  --    < > 96*  --  96* 98 101 99 97*  CO2 24  --    < > 25  --  '26 28 25 25 28   '$ GLUCOSE 110*  --    < > 146*  --  122* 121* 132* 134* 137*  BUN 48*  --    < > 30*  --  34* 18 26* 39* 25*  CREATININE 4.89*  --    < > 3.39*  --  4.01* 2.90* 3.77* 4.83* 3.67*  CALCIUM 8.6*  --    < > 8.0*  --  8.2* 8.0* 8.3* 8.3* 8.1*  MG 2.0 1.9  --  1.9 1.9 1.9  --   --   --   --   PHOS  --   --    < > 3.0  --  2.9 2.9 4.0 4.4 3.8   < > = values in this interval not displayed.     Liver Function Tests: Recent Labs  Lab 01/03/21 0803 01/03/21 2053 01/05/21 0500 01/06/21 0335 01/07/21 0229 01/08/21 0720 01/09/21 0326  AST 114*  --  86*  --   --   --   --   ALT 100*  --  79*  --   --   --   --   ALKPHOS 93  --  91  --   --   --   --   BILITOT 0.7  --  0.6  --   --   --   --   PROT 6.7  --  6.1*  --   --   --   --  ALBUMIN 2.7*   < > 2.4* 2.3* 2.5* 2.2* 2.1*   < > = values in this interval not displayed.    No results for input(s): LIPASE, AMYLASE in the last 168 hours. No results for input(s): AMMONIA in the last 168 hours.  CBC: Recent Labs  Lab 01/05/21 0500 01/06/21 0335 01/07/21 0229 01/08/21 0720 01/09/21 0326  WBC 8.1 7.9 9.3 8.7 9.9  HGB 8.3* 8.5* 9.4* 7.9* 7.2*  HCT 26.0* 26.2* 30.9* 25.8* 23.4*  MCV 85.2 84.8 87.5 87.8 87.3  PLT 269 223 260 279 235     Cardiac Enzymes: No results for input(s): CKTOTAL, CKMB, CKMBINDEX, TROPONINI in the last 168 hours.  BNP: Invalid input(s): POCBNP  CBG: Recent Labs  Lab 01/08/21 0615 01/08/21 1211 01/08/21 1625 01/08/21 2042 01/09/21 0749  GLUCAP 117* 105* 110* 165* 124*     Microbiology: Results for orders placed or performed during the hospital encounter of 12/27/20  Resp Panel by RT-PCR (Flu A&B, Covid) Nasopharyngeal Swab     Status: None   Collection Time: 12/27/20  8:53 AM   Specimen: Nasopharyngeal Swab; Nasopharyngeal(NP) swabs in vial transport medium  Result Value Ref Range Status   SARS Coronavirus 2 by RT PCR NEGATIVE NEGATIVE Final    Comment: (NOTE) SARS-CoV-2 target nucleic acids  are NOT DETECTED.  The SARS-CoV-2 RNA is generally detectable in upper respiratory specimens during the acute phase of infection. The lowest concentration of SARS-CoV-2 viral copies this assay can detect is 138 copies/mL. A negative result does not preclude SARS-Cov-2 infection and should not be used as the sole basis for treatment or other patient management decisions. A negative result may occur with  improper specimen collection/handling, submission of specimen other than nasopharyngeal swab, presence of viral mutation(s) within the areas targeted by this assay, and inadequate number of viral copies(<138 copies/mL). A negative result must be combined with clinical observations, patient history, and epidemiological information. The expected result is Negative.  Fact Sheet for Patients:  EntrepreneurPulse.com.au  Fact Sheet for Healthcare Providers:  IncredibleEmployment.be  This test is no t yet approved or cleared by the [place] FDA and  has been authorized for detection and/or diagnosis of SARS-CoV-2 by FDA under an Emergency Use Authorization (EUA). This EUA will remain  in effect (meaning this test can be used) for the duration of the COVID-19 declaration under Section 564(b)(1) of the Act, 21 U.S.C.section 360bbb-3(b)(1), unless the authorization is terminated  or revoked sooner.       Influenza A by PCR NEGATIVE NEGATIVE Final   Influenza B by PCR NEGATIVE NEGATIVE Final    Comment: (NOTE) The Xpert Xpress SARS-CoV-2/FLU/RSV plus assay is intended as an aid in the diagnosis of influenza from Nasopharyngeal swab specimens and should not be used as a sole basis for treatment. Nasal washings and aspirates are unacceptable for Xpert Xpress SARS-CoV-2/FLU/RSV testing.  Fact Sheet for Patients: EntrepreneurPulse.com.au  Fact Sheet for Healthcare Providers: IncredibleEmployment.be  This test is  not yet approved or cleared by the [place] FDA and has been authorized for detection and/or diagnosis of SARS-CoV-2 by FDA under an Emergency Use Authorization (EUA). This EUA will remain in effect (meaning this test can be used) for the duration of the COVID-19 declaration under Section 564(b)(1) of the Act, 21 U.S.C. section 360bbb-3(b)(1), unless the authorization is terminated or revoked.  Performed at [facility], [place] [address] [place], [place] [address]   Surgical pcr screen     Status: None  Collection Time: 01/05/21  7:42 PM   Specimen: Nasal Mucosa; Nasal Swab  Result Value Ref Range Status   MRSA, PCR NEGATIVE NEGATIVE Final   Staphylococcus aureus NEGATIVE NEGATIVE Final    Comment: (NOTE) The Xpert SA Assay (FDA approved for NASAL specimens in patients 27 years of age and older), is one component of a comprehensive surveillance program. It is not intended to diagnose infection nor to guide or monitor treatment. Performed at [facility], [place] [address], [place], [place] [address]     Coagulation Studies: No results for input(s): LABPROT, INR in the last 72 hours.  Urinalysis: No results for input(s): COLORURINE, LABSPEC, PHURINE, GLUCOSEU, HGBUR, BILIRUBINUR, KETONESUR, PROTEINUR, UROBILINOGEN, NITRITE, LEUKOCYTESUR in the last 72 hours.  Invalid input(s): APPERANCEUR    Imaging: VAS [place] UPPER EXT VEIN MAPPING (PRE-OP AVF)  Result Date: 01/07/2021 UPPER EXTREMITY VEIN MAPPING Patient Name:  MERRILEE HERMS  Date of Exam:   01/07/2021 Medical Rec #: [id number]     Accession #:    [id number] Date of Birth: Jun 03, 1950     Patient Gender: F Patient Age:   40 years Exam Location:  [facility] Procedure:      VAS [place] UPPER EXT VEIN MAPPING (PRE-OP AVF) Referring Phys: Melony Overly --------------------------------------------------------------------------------  Indications: Pre-access. Comparison Study: No previous exams Performing Technologist:  Jody Hill RVT, RDMS  Examination Guidelines: A complete evaluation includes B-mode imaging, spectral Doppler, color Doppler, and power Doppler as needed of all accessible portions of each vessel. Bilateral testing is considered an integral part of a complete examination. Limited examinations for reoccurring indications may be performed as noted. +-----------------+-------------+----------+-------------------------------+ Right Cephalic   Diameter (cm)Depth (cm)           Findings             +-----------------+-------------+----------+-------------------------------+ Shoulder             0.29        1.51                                   +-----------------+-------------+----------+-------------------------------+ Prox upper arm       0.26        0.57                                   +-----------------+-------------+----------+-------------------------------+ Mid upper arm        0.22        0.68                                   +-----------------+-------------+----------+-------------------------------+ Dist upper arm       0.20        0.64                                   +-----------------+-------------+----------+-------------------------------+ Antecubital fossa    0.35        0.26                                   +-----------------+-------------+----------+-------------------------------+ Prox forearm         0.26        0.46  branching            +-----------------+-------------+----------+-------------------------------+ Mid forearm          0.21        0.69                                   +-----------------+-------------+----------+-------------------------------+ Dist forearm         0.24        0.28                                   +-----------------+-------------+----------+-------------------------------+ Wrist                                   not visualized and IV placement  +-----------------+-------------+----------+-------------------------------+ +-----------------+-------------+----------+-------------------------------+ Right Basilic    Diameter (cm)Depth (cm)           Findings             +-----------------+-------------+----------+-------------------------------+ Prox upper arm       0.23                                               +-----------------+-------------+----------+-------------------------------+ Mid upper arm        0.26                                               +-----------------+-------------+----------+-------------------------------+ Dist upper arm       0.18                                               +-----------------+-------------+----------+-------------------------------+ Antecubital fossa    0.16                          branching            +-----------------+-------------+----------+-------------------------------+ Prox forearm         0.12                                               +-----------------+-------------+----------+-------------------------------+ Mid forearm          0.08                                               +-----------------+-------------+----------+-------------------------------+ Distal forearm                                  not visualized          +-----------------+-------------+----------+-------------------------------+ Wrist  not visualized and IV placement +-----------------+-------------+----------+-------------------------------+ Subcutaneous edema noted from mid upper arm to mid forearm. +-----------------+-------------+----------+----------------------+ Left Cephalic    Diameter (cm)Depth (cm)       Findings        +-----------------+-------------+----------+----------------------+ Shoulder             0.34        1.14                          +-----------------+-------------+----------+----------------------+ Prox  upper arm       0.36        0.97                          +-----------------+-------------+----------+----------------------+ Mid upper arm        0.39        0.99                          +-----------------+-------------+----------+----------------------+ Dist upper arm       0.42        0.91                          +-----------------+-------------+----------+----------------------+ Antecubital fossa    0.58        0.37   branching and thrombus +-----------------+-------------+----------+----------------------+ Prox forearm         0.12        0.73         branching        +-----------------+-------------+----------+----------------------+ Mid forearm          0.07        0.37                          +-----------------+-------------+----------+----------------------+ Dist forearm                                not visualized     +-----------------+-------------+----------+----------------------+ Wrist                                       not visualized     +-----------------+-------------+----------+----------------------+ +-----------------+-------------+----------+---------+ Left Basilic     Diameter (cm)Depth (cm)Findings  +-----------------+-------------+----------+---------+ Prox upper arm       0.34                         +-----------------+-------------+----------+---------+ Mid upper arm        0.23                         +-----------------+-------------+----------+---------+ Dist upper arm       0.16                         +-----------------+-------------+----------+---------+ Antecubital fossa    0.15               branching +-----------------+-------------+----------+---------+ Prox forearm         0.13                         +-----------------+-------------+----------+---------+ Mid forearm          0.12                         +-----------------+-------------+----------+---------+  Distal forearm       0.13                          +-----------------+-------------+----------+---------+ Wrist                0.12                         +-----------------+-------------+----------+---------+ Subcutaneous edema in area of AC fossa Summary:  Left: Age indeterminate SVT seen in cephalic vein in area of AC       fossa. *See table(s) above for measurements and observations.  Diagnosing physician:    Preliminary      Medications:    sodium chloride     sodium chloride     sodium chloride     sodium chloride 10 mL/hr at 01/06/21 1409   heparin 500 Units/hr (01/08/21 2208)    acetaminophen  1,000 mg Oral TID   amiodarone  200 mg Oral Daily   Chlorhexidine Gluconate Cloth  6 each Topical Daily   Chlorhexidine Gluconate Cloth  6 each Topical Q0600   cilostazol  50 mg Oral BID   docusate sodium  100 mg Oral BID   ezetimibe  10 mg Oral Daily   famotidine  20 mg Oral Daily   feeding supplement (NEPRO CARB STEADY)  237 mL Oral BID BM   hydrALAZINE  25 mg Oral Q8H   insulin aspart  0-6 Units Subcutaneous TID WC   isosorbide mononitrate  30 mg Oral Daily   lidocaine  1 patch Transdermal Q24H   metoprolol succinate  50 mg Oral Daily   multivitamin  1 tablet Oral QHS   pantoprazole  40 mg Oral QHS   rosuvastatin  10 mg Oral Daily   sodium chloride flush  3 mL Intravenous Q12H   sodium chloride, sodium chloride, sodium chloride, acetaminophen, alteplase, heparin, hydrALAZINE, hydrocortisone cream, HYDROmorphone (DILAUDID) injection, HYDROmorphone (DILAUDID) injection, lidocaine (PF), lidocaine-prilocaine, loperamide, loratadine, menthol-cetylpyridinium **OR** phenol, metoCLOPramide **OR** metoCLOPramide (REGLAN) injection, Muscle Rub, naLOXone (NARCAN)  injection, pentafluoroprop-tetrafluoroeth, sodium chloride, sodium chloride flush  Assessment/ Plan:  1.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 Now s/p left  ureteral stent placement but with no improvement. -HD started for uremia & failed lasix challenge. Rt fem temp line placed with IR on 9/18.  Hemodialysis started 01/01/2021.  Her last dialysis treatment was 01/05/2021.  Patient is receiving dialysis 01/09/2023 off schedule due to high dialysis volumes.  The next dialysis treatment be 01/11/2021.  Patient for AV fistula 01/10/2021.  We will also have conversion to [facility].    2.Hematuria Left obstructive uropathy-  -in the setting of mildly obstructing 6 mm distal left ureter renal stone seen on CT urogram. S/p left ureteral stent on 9/15. ANCA and lupus serologies wnl. Appreciate urology's assistance  3.Intertrochanteric hip fracture- s/p fall in the hospital.  -ortho on board, preop clearance for repair    4.Epistaxis- overnight 9/14, since resolved. Thought to be due to nasal canula. Heparin on hold, continue to monitor. Anca serologies neg  5.Left renal artery stenosis- Hx of 80-90% proximal left renal artery stenosis.  6.Hypertension- BP well controlled on norvasc, metoprolol, and hydralazine.  Ultrafiltration with dialysis blood pressure control improved   7.Hyponatremia-resolved  8.Afib w/ RVR-on amio. Hep gtt on hold given epistaxis, which has resolved.   9.Anemia due to chronic kidney disease-Hgb stable at  8.3   10.Diabetes Mellitus Type 2 with Hyperglycemia- -per primary    LOS: [place] '@TODAY''@9'$ :40 AM

## 2021-01-09 NOTE — Progress Notes (Addendum)
Daily Progress Note   Patient Name: Molly [name]       Date: 01/09/2021 DOB: 09-May-1950  Age: 70 y.o. MRN#: [id number] Attending Physician: Barb Merino, MD Primary Care Physician: [name], MD Admit Date: 12/27/2020  Reason for Consultation/Follow-up: Establishing goals of care and Pain control  Subjective: Medical records reviewed. Discussed with RN and assessed patient at the bedside. Dr. Sloan Leiter and patient's husband are present. She is more alert today, reports 9/10 left hip pain despite receiving Tylenol this morning. Received 5 PRN doses of IV dilaudid in the past 48 hours.   Molly [name] is understandably tearful when describing the difficulty she has had with pain control. Pain continues to radiate down the left leg. Lidocaine patches, creams, etc have not been helpful. Tylenol is effective at times, improving her pain to 5/10 for about an hour. Her goal is to achieve 3/10 pain level and be able to sit up, watch TV, and talk to her friends when they check on her.   Discussed plan to try PO oxycodone IR PRN for severe pain and continue IV dilaudid PRN for breakthrough/incident pain. Educated on the importance of dose titration and continuing gradual adjustments to minimize adverse effects. Created space and opportunity for Molly [name]'s thoughts and feelings on her current illness and goals of care moving forward. At this time, she would like to focus on her pain control and feeling better.  Questions and concerns addressed. PMT will continue to support holistically.  Length of Stay: 13  Current Medications: Scheduled Meds:   acetaminophen  1,000 mg Oral TID   amiodarone  200 mg Oral Daily   Chlorhexidine Gluconate Cloth  6 each Topical Daily   Chlorhexidine Gluconate Cloth  6 each  Topical Q0600   cilostazol  50 mg Oral BID   docusate sodium  100 mg Oral BID   ezetimibe  10 mg Oral Daily   famotidine  20 mg Oral Daily   feeding supplement (NEPRO CARB STEADY)  237 mL Oral BID BM   FLUoxetine  10 mg Oral Daily   hydrALAZINE  25 mg Oral Q8H   insulin aspart  0-6 Units Subcutaneous TID WC   isosorbide mononitrate  30 mg Oral Daily   lidocaine  1 patch Transdermal Q24H   metoprolol succinate  50 mg Oral Daily  multivitamin  1 tablet Oral QHS   pantoprazole  40 mg Oral QHS   rosuvastatin  10 mg Oral Daily   sodium chloride flush  3 mL Intravenous Q12H    Continuous Infusions:  sodium chloride     sodium chloride     sodium chloride     sodium chloride 10 mL/hr at 01/06/21 1409   heparin 500 Units/hr (01/08/21 2208)    PRN Meds: sodium chloride, sodium chloride, sodium chloride, acetaminophen, alteplase, heparin, hydrALAZINE, hydrocortisone cream, HYDROmorphone (DILAUDID) injection, HYDROmorphone (DILAUDID) injection, lidocaine (PF), lidocaine-prilocaine, loperamide, loratadine, menthol-cetylpyridinium **OR** phenol, metoCLOPramide **OR** metoCLOPramide (REGLAN) injection, Muscle Rub, naLOXone (NARCAN)  injection, oxyCODONE, pentafluoroprop-tetrafluoroeth, sodium chloride, sodium chloride flush  Physical Exam Vitals and nursing note reviewed.  Constitutional:      Interventions: Nasal cannula in place.  Cardiovascular:     Rate and Rhythm: Normal rate.  Pulmonary:     Effort: Pulmonary effort is normal.  Neurological:     Mental Status: She is alert and oriented to person, place, and time.  Psychiatric:        Mood and Affect: Mood is depressed.        Behavior: Behavior is cooperative.            Vital Signs: BP (!) 120/49   Pulse 72   Temp 98.8 F (37.1 C) (Oral)   Resp 18   Ht '5\' 2"'$  (1.575 m)   Wt 63.1 kg   SpO2 100%   BMI 25.44 kg/m  SpO2: SpO2: 100 % O2 Device: O2 Device: Nasal Cannula O2 Flow Rate: O2 Flow Rate (L/min): 2  L/min  Intake/output summary:  Intake/Output Summary (Last 24 hours) at 01/09/2021 1143 Last data filed at 01/08/2021 1600 Gross per 24 hour  Intake 201.02 ml  Output --  Net 201.02 ml   LBM: Last BM Date: 01/05/21 Baseline Weight: Weight: 66.7 kg Most recent weight: Weight: 63.1 kg   Patient Active Problem List   Diagnosis Date Noted   Abnormal CT of the abdomen    CAP (community acquired pneumonia) 12/27/2020   Anemia 12/27/2020   Dyspnea on exertion 11/26/2020   Acute on chronic respiratory failure with hypoxia ([place]) 11/26/2020   Acute on chronic respiratory failure with hypoxia and hypercapnia (HCC) 11/26/2020   Stage 4 chronic kidney disease ([place]) 11/26/2020   Acute on chronic heart failure with preserved ejection fraction (HFpEF) ([place]) 11/26/2020   Elevated d-dimer 11/26/2020   Prolonged QT interval 11/26/2020   Congestive heart failure (CHF) ([place]) 10/07/2020   Elevated troponin    Chest pain 08/31/2020   Hypertensive emergency 07/12/2020   Type 2 diabetes mellitus with stage 3b chronic kidney disease, with long-term current use of insulin ([place]) 07/12/2020   Hypertension    Diabetes mellitus without complication (HCC)    Atherosclerosis of coronary artery without angina pectoris 04/21/2020   Atopic dermatitis 04/21/2020   Colon cancer screening 04/21/2020   Diarrhea 04/21/2020   Gastroesophageal reflux disease 04/21/2020   Hyperglycemia due to type 2 diabetes mellitus ([place]) 04/21/2020   Hypertensive heart disease without congestive heart failure 04/21/2020   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   Left-sided chest pain 04/21/2020   Long term (current) use of insulin ([place]) 04/21/2020   Loss of appetite 04/21/2020   Nausea 04/21/2020   Occlusion and stenosis of bilateral carotid arteries 04/21/2020   Proptosis 04/21/2020   Benign hypertension with CKD (chronic kidney disease) stage III ([place]) 04/21/2020   Epigastric  pain 04/21/2020   Standard chest x-ray abnormal 04/21/2020   Vitamin D deficiency 04/21/2020   Weight loss 04/21/2020   Renal artery stenosis (HCC) 12/17/2019   Claudication in peripheral vascular disease ([place]) 08/28/2019   Bilateral impacted cerumen 08/21/2019   Irritable bowel syndrome with diarrhea 10/02/2018   Osteopenia 10/02/2018   Other dysphagia 09/10/2018   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   Proximal leg weakness 09/10/2018   Ptosis of left eyelid 09/10/2018   Dyslipidemia 08/30/2018   Type 2 diabetes mellitus with stage 4 chronic kidney disease, with long-term current use of insulin ([place]) 07/15/2018   Peripheral vascular disease ([place]) 07/15/2018   DKA (diabetic ketoacidoses) 06/29/2015   HTN (hypertension) 06/29/2015   Acute kidney injury ([place]) 06/29/2015   Cholelithiasis 06/29/2015   Abdominal aortic atherosclerosis with stenosis 06/29/2015   Dehydration with hyponatremia 06/29/2015   Leukocytosis 06/29/2015   Cholecystitis 06/29/2015    Palliative Care Assessment & Plan   Patient Profile: Molly [name] is a 71 y.o. female with medical history significant of HTN, diastolic CHF, left renal artery stenosis (90%), CKD stage IV, PAD with bilateral SFA CTO's, anemia of chronic kidney disease, and diabetes mellitus type 2 presents with complaints of a chest pain for the last 3 to 4 days.  Previously admitted last month for acute on hypercapnic respiratory failure with hypoxia secondary to diastolic heart failure exacerbation.  Palliative medicine has been consulted to assist with goals of care conversation and pain management.  Assessment: Left hip fracture s/p nailing 9/22 Post-op pain of left hip Poor nutritional intake AKI on CKD4, on HD Goals of care conversation  Recommendations/Plan: -Full code/full scope treatment -Agree with addition of oxycodone IR '5mg'$  PO Q6H PRN for moderate/severe pain, patient will benefit from premedication before  working with PT this week -Continue 0.'5mg'$  IV dilaudid for refractory/breakthrough/incident pain -Agree with Prozac '10mg'$  PO daily for depression -Psychosocial and emotional support provided -Ongoing support from PMT  Goals of Care and Additional Recommendations: Limitations on Scope of Treatment: Full Scope Treatment  Code Status: FULL   Code Status Orders  (From admission, onward)           Start     Ordered   12/27/20 1050  Full code  Continuous        12/27/20 1053           Code Status History     Date Active Date Inactive Code Status Order ID Comments User Context   11/26/2020 2003 12/03/2020 1647 Full Code [id number]  Chotiner, Yevonne Aline, MD Inpatient   08/31/2020 1347 09/13/2020 1859 Full Code [id number]  Jonnie Finner, DO ED   08/28/2019 1513 08/29/2019 1828 Full Code [id number]  Lorretta Harp, MD Inpatient   06/29/2015 1139 07/06/2015 2056 Full Code [id number]  Samella Parr, NP ED      Prognosis:  Unable to determine  Discharge Planning: To Be Determined  Care plan was discussed with patient, patient's husband, Dr. Sloan Leiter  Total time: 35 minutes Greater than 50% of this time was spent in counseling and coordinating care related to the above assessment and plan.  Dorthy Cooler, PA-C Palliative Medicine Team Team phone # [phone]  Thank you for allowing the Palliative Medicine Team to assist in the care of this patient. Please utilize secure chat with additional questions, if there is no response within 30 minutes please call the above phone number.  Palliative Medicine Team providers are available by phone from 7am to 7pm  daily and can be reached through the team cell phone.  Should this patient require assistance outside of these hours, please call the patient's attending physician.

## 2021-01-09 NOTE — Progress Notes (Signed)
Progress Note    01/09/2021 10:37 AM 3 Days Post-Op  Subjective: No complaints today  Vitals:   01/08/21 2010 01/09/21 0606  BP: (!) 122/45 (!) 120/49  Pulse: 72   Resp: 18   Temp: 98.8 F (37.1 C)   SpO2: 100%     Physical Exam: Vitals are within Nonlabored respirations Left radial artery pulses palpable Left upper arm does have areas of previous IV no hematoma present no IVs currently on her arm  CBC    Component Value Date/Time   WBC 9.9 01/09/2021 0326   RBC 2.68 (L) 01/09/2021 0326   HGB 7.2 (L) 01/09/2021 0326   HGB 12.5 08/19/2019 1111   HCT 23.4 (L) 01/09/2021 0326   HCT 39.4 08/19/2019 1111   PLT 235 01/09/2021 0326   PLT 299 08/19/2019 1111   MCV 87.3 01/09/2021 0326   MCV 86 08/19/2019 1111   MCH 26.9 01/09/2021 0326   MCHC 30.8 01/09/2021 0326   RDW 16.9 (H) 01/09/2021 0326   RDW 12.7 08/19/2019 1111   LYMPHSABS 1.3 11/26/2020 1226   MONOABS 0.5 11/26/2020 1226   EOSABS 0.1 11/26/2020 1226   BASOSABS 0.0 11/26/2020 1226    BMET    Component Value Date/Time   NA 134 (L) 01/09/2021 0326   NA 141 09/03/2019 1600   K 3.5 01/09/2021 0326   CL 97 (L) 01/09/2021 0326   CO2 28 01/09/2021 0326   GLUCOSE 137 (H) 01/09/2021 0326   BUN 25 (H) 01/09/2021 0326   BUN 24 09/03/2019 1600   CREATININE 3.67 (H) 01/09/2021 0326   CALCIUM 8.1 (L) 01/09/2021 0326   GFRNONAA 13 (L) 01/09/2021 0326   GFRAA 59 (L) 09/03/2019 1600    INR No results found for: INR   Intake/Output Summary (Last 24 hours) at 01/09/2021 1037 Last data filed at 01/08/2021 1600 Gross per 24 hour  Intake 201.02 ml  Output 1851 ml  Net -1649.98 ml   +-----------------+-------------+----------+----------------------+  Left Cephalic    Diameter (cm)Depth (cm)       Findings         +-----------------+-------------+----------+----------------------+  Shoulder             0.34        1.14                            +-----------------+-------------+----------+----------------------+  Prox upper arm       0.36        0.97                           +-----------------+-------------+----------+----------------------+  Mid upper arm        0.39        0.99                           +-----------------+-------------+----------+----------------------+  Dist upper arm       0.42        0.91                           +-----------------+-------------+----------+----------------------+  Antecubital fossa    0.58        0.37   branching and thrombus  +-----------------+-------------+----------+----------------------+  Prox forearm         0.12        0.73  branching         +-----------------+-------------+----------+----------------------+  Mid forearm          0.07        0.37               Assessment:  70 y.o. female is here with end-stage renal disease has dialyzed via temporary catheter.  Plan: OR tomorrow for tunneled dialysis catheter and left arm fistula versus graft.  She appears to have suitable cephalic vein on the left that has had several IVs by physical exam.  I discussed with her and her family she may require graft and they demonstrate good understanding.   Viren Lebeau C. Donzetta Matters, MD [facility] Office: [phone] Pager: [phone]  01/09/2021 10:37 AM

## 2021-01-09 NOTE — Progress Notes (Signed)
Cardiology Progress Note  Patient ID: AMERIKA LAVER MRN: [id number] DOB: [birth year] Date of Encounter: 01/09/2021  Primary Cardiologist: Jenne Campus, MD  Subjective   Still with depression and pain   Inpatient Medications  Scheduled Meds:  acetaminophen  1,000 mg Oral TID   amiodarone  200 mg Oral Daily   Chlorhexidine Gluconate Cloth  6 each Topical Daily   Chlorhexidine Gluconate Cloth  6 each Topical Q0600   cilostazol  50 mg Oral BID   docusate sodium  100 mg Oral BID   ezetimibe  10 mg Oral Daily   famotidine  20 mg Oral Daily   feeding supplement (NEPRO CARB STEADY)  237 mL Oral BID BM   hydrALAZINE  25 mg Oral Q8H   insulin aspart  0-6 Units Subcutaneous TID WC   isosorbide mononitrate  30 mg Oral Daily   lidocaine  1 patch Transdermal Q24H   metoprolol succinate  50 mg Oral Daily   multivitamin  1 tablet Oral QHS   pantoprazole  40 mg Oral QHS   rosuvastatin  10 mg Oral Daily   sodium chloride flush  3 mL Intravenous Q12H   Continuous Infusions:  sodium chloride     sodium chloride     sodium chloride     sodium chloride 10 mL/hr at 01/06/21 1409   heparin 500 Units/hr (01/08/21 2208)   PRN Meds: sodium chloride, sodium chloride, sodium chloride, acetaminophen, alteplase, heparin, hydrALAZINE, hydrocortisone cream, HYDROmorphone (DILAUDID) injection, HYDROmorphone (DILAUDID) injection, lidocaine (PF), lidocaine-prilocaine, loperamide, loratadine, menthol-cetylpyridinium **OR** phenol, metoCLOPramide **OR** metoCLOPramide (REGLAN) injection, Muscle Rub, naLOXone (NARCAN)  injection, pentafluoroprop-tetrafluoroeth, sodium chloride, sodium chloride flush   Vital Signs   Vitals:   01/08/21 1115 01/08/21 1528 01/08/21 2010 01/09/21 0606  BP: (!) 143/59 (!) 107/40 (!) 122/45 (!) 120/49  Pulse: 75 78 72   Resp:  18 18   Temp: 98.4 F (36.9 C) 98.7 F (37.1 C) 98.8 F (37.1 C)   TempSrc: Oral Oral Oral   SpO2:  95% 100%   Weight: 63.1 kg     Height:         Intake/Output Summary (Last 24 hours) at 01/09/2021 0828 Last data filed at 01/08/2021 1600 Gross per 24 hour  Intake 201.02 ml  Output 1851 ml  Net -1649.98 ml   Last 3 Weights 01/08/2021 01/08/2021 01/08/2021  Weight (lbs) 139 lb 1.8 oz 143 lb 1.3 oz 153 lb 14.1 oz  Weight (kg) 63.1 kg 64.9 kg 69.8 kg      Telemetry  Overnight telemetry shows SR 70s, which I personally reviewed.   Physical Exam   Vitals:   01/08/21 1115 01/08/21 1528 01/08/21 2010 01/09/21 0606  BP: (!) 143/59 (!) 107/40 (!) 122/45 (!) 120/49  Pulse: 75 78 72   Resp:  18 18   Temp: 98.4 F (36.9 C) 98.7 F (37.1 C) 98.8 F (37.1 C)   TempSrc: Oral Oral Oral   SpO2:  95% 100%   Weight: 63.1 kg     Height:        Intake/Output Summary (Last 24 hours) at 01/09/2021 0828 Last data filed at 01/08/2021 1600 Gross per 24 hour  Intake 201.02 ml  Output 1851 ml  Net -1649.98 ml    Last 3 Weights 01/08/2021 01/08/2021 01/08/2021  Weight (lbs) 139 lb 1.8 oz 143 lb 1.3 oz 153 lb 14.1 oz  Weight (kg) 63.1 kg 64.9 kg 69.8 kg    Body mass index is 25.44 kg/m.   Chronically  ill black female Proptosis Lungs clear No murmur  Right femoral dialysis catheter  Post pinning of left hip fracture    Labs  High Sensitivity Troponin:   Recent Labs  Lab 12/27/20 0640 12/27/20 0835  TROPONINIHS 397* 376*     Cardiac EnzymesNo results for input(s): TROPONINI in the last 168 hours. No results for input(s): TROPIPOC in the last 168 hours.  Chemistry Recent Labs  Lab 01/03/21 0803 01/03/21 2053 01/05/21 0500 01/06/21 0335 01/07/21 0229 01/08/21 0720 01/09/21 0326  NA 134*   < > 133*   < > 136 135 134*  K 3.6   < > 3.6   < > 3.8 4.1 3.5  CL 93*   < > 96*   < > 101 99 97*  CO2 24   < > 26   < > '25 25 28  '$ GLUCOSE 110*   < > 122*   < > 132* 134* 137*  BUN 48*   < > 34*   < > 26* 39* 25*  CREATININE 4.89*   < > 4.01*   < > 3.77* 4.83* 3.67*  CALCIUM 8.6*   < > 8.2*   < > 8.3* 8.3* 8.1*  PROT 6.7  --  6.1*  --    --   --   --   ALBUMIN 2.7*   < > 2.4*   < > 2.5* 2.2* 2.1*  AST 114*  --  86*  --   --   --   --   ALT 100*  --  79*  --   --   --   --   ALKPHOS 93  --  91  --   --   --   --   BILITOT 0.7  --  0.6  --   --   --   --   GFRNONAA 9*   < > 12*   < > 12* 9* 13*  ANIONGAP 17*   < > 11   < > '10 11 9   '$ < > = values in this interval not displayed.    Hematology Recent Labs  Lab 01/07/21 0229 01/08/21 0720 01/09/21 0326  WBC 9.3 8.7 9.9  RBC 3.53* 2.94* 2.68*  HGB 9.4* 7.9* 7.2*  HCT 30.9* 25.8* 23.4*  MCV 87.5 87.8 87.3  MCH 26.6 26.9 26.9  MCHC 30.4 30.6 30.8  RDW 16.6* 16.8* 16.9*  PLT 260 279 235   BNPNo results for input(s): BNP, PROBNP in the last 168 hours.  DDimer No results for input(s): DDIMER in the last 168 hours.   Radiology  VAS [place] UPPER EXT VEIN MAPPING (PRE-OP AVF)  Result Date: 01/07/2021 UPPER EXTREMITY VEIN MAPPING Patient Name:  DAVYNE KHONG  Date of Exam:   01/07/2021 Medical Rec #: [id number]     Accession #:    [id number] Date of Birth: [birth year]     Patient Gender: F Patient Age:   70 years Exam Location:  [facility] Procedure:      VAS [place] UPPER EXT VEIN MAPPING (PRE-OP AVF) Referring Phys: Melony Overly --------------------------------------------------------------------------------  Indications: Pre-access. Comparison Study: No previous exams Performing Technologist: Jody Hill RVT, RDMS  Examination Guidelines: A complete evaluation includes B-mode imaging, spectral Doppler, color Doppler, and power Doppler as needed of all accessible portions of each vessel. Bilateral testing is considered an integral part of a complete examination. Limited examinations for reoccurring indications may be performed as noted. +-----------------+-------------+----------+-------------------------------+ Right Cephalic   Diameter (cm)Depth (cm)  Findings             +-----------------+-------------+----------+-------------------------------+ Shoulder              0.29        1.51                                   +-----------------+-------------+----------+-------------------------------+ Prox upper arm       0.26        0.57                                   +-----------------+-------------+----------+-------------------------------+ Mid upper arm        0.22        0.68                                   +-----------------+-------------+----------+-------------------------------+ Dist upper arm       0.20        0.64                                   +-----------------+-------------+----------+-------------------------------+ Antecubital fossa    0.35        0.26                                   +-----------------+-------------+----------+-------------------------------+ Prox forearm         0.26        0.46              branching            +-----------------+-------------+----------+-------------------------------+ Mid forearm          0.21        0.69                                   +-----------------+-------------+----------+-------------------------------+ Dist forearm         0.24        0.28                                   +-----------------+-------------+----------+-------------------------------+ Wrist                                   not visualized and IV placement +-----------------+-------------+----------+-------------------------------+ +-----------------+-------------+----------+-------------------------------+ Right Basilic    Diameter (cm)Depth (cm)           Findings             +-----------------+-------------+----------+-------------------------------+ Prox upper arm       0.23                                               +-----------------+-------------+----------+-------------------------------+ Mid upper arm        0.26                                               +-----------------+-------------+----------+-------------------------------+  Dist upper arm       0.18                                                +-----------------+-------------+----------+-------------------------------+ Antecubital fossa    0.16                          branching            +-----------------+-------------+----------+-------------------------------+ Prox forearm         0.12                                               +-----------------+-------------+----------+-------------------------------+ Mid forearm          0.08                                               +-----------------+-------------+----------+-------------------------------+ Distal forearm                                  not visualized          +-----------------+-------------+----------+-------------------------------+ Wrist                                   not visualized and IV placement +-----------------+-------------+----------+-------------------------------+ Subcutaneous edema noted from mid upper arm to mid forearm. +-----------------+-------------+----------+----------------------+ Left Cephalic    Diameter (cm)Depth (cm)       Findings        +-----------------+-------------+----------+----------------------+ Shoulder             0.34        1.14                          +-----------------+-------------+----------+----------------------+ Prox upper arm       0.36        0.97                          +-----------------+-------------+----------+----------------------+ Mid upper arm        0.39        0.99                          +-----------------+-------------+----------+----------------------+ Dist upper arm       0.42        0.91                          +-----------------+-------------+----------+----------------------+ Antecubital fossa    0.58        0.37   branching and thrombus +-----------------+-------------+----------+----------------------+ Prox forearm         0.12        0.73         branching         +-----------------+-------------+----------+----------------------+ Mid forearm          0.07        0.37                          +-----------------+-------------+----------+----------------------+  Dist forearm                                not visualized     +-----------------+-------------+----------+----------------------+ Wrist                                       not visualized     +-----------------+-------------+----------+----------------------+ +-----------------+-------------+----------+---------+ Left Basilic     Diameter (cm)Depth (cm)Findings  +-----------------+-------------+----------+---------+ Prox upper arm       0.34                         +-----------------+-------------+----------+---------+ Mid upper arm        0.23                         +-----------------+-------------+----------+---------+ Dist upper arm       0.16                         +-----------------+-------------+----------+---------+ Antecubital fossa    0.15               branching +-----------------+-------------+----------+---------+ Prox forearm         0.13                         +-----------------+-------------+----------+---------+ Mid forearm          0.12                         +-----------------+-------------+----------+---------+ Distal forearm       0.13                         +-----------------+-------------+----------+---------+ Wrist                0.12                         +-----------------+-------------+----------+---------+ Subcutaneous edema in area of AC fossa Summary:  Left: Age indeterminate SVT seen in cephalic vein in area of AC       fossa. *See table(s) above for measurements and observations.  Diagnosing physician:    Preliminary     Cardiac Studies  TTE 12/28/2020  1. Compared with the echo [id number], systolic function is reduced. Left  ventricular ejection fraction, by estimation, is 40 to 45%. The left  ventricle has mildly  decreased function. The left ventricle demonstrates  global hypokinesis. Left ventricular  diastolic parameters are indeterminate.   2. Right ventricular systolic function is normal. The right ventricular  size is normal. There is moderately elevated pulmonary artery systolic  pressure.   3. The pericardial effusion is circumferential.   4. The mitral valve is normal in structure. Mild mitral valve  regurgitation. No evidence of mitral stenosis.   5. The aortic valve is tricuspid. There is mild calcification of the  aortic valve. There is mild thickening of the aortic valve. Aortic valve  regurgitation is not visualized. No aortic stenosis is present.   6. The inferior vena cava is dilated in size with <50% respiratory  variability, suggesting right atrial pressure of 15 mmHg.   Patient Profile  70 year old female with history of CKD stage IV, diastolic heart failure,  PAD (bilateral SFA CTO's), left renal artery stenosis (90%), diabetes, CAD who was admitted on 12/27/2020 with chest pain and acute hypoxic respiratory failure secondary to pulmonary edema from likely hypertensive crisis.  Course complicated by A. fib with RVR 12/28/2020.  She is also developed AKI requiring hemodialysis.  She did suffer a fall this admission that resulted in a left hip fracture.  This has been repaired.  Course also complicated by anemia which has been evaluated by GI with no evidence of active bleeding.  Assessment & Plan   #A. fib with RVR:   converted to NSR on amiodarone load for 7 days then 200 mg for 3 weeks not long term Rx. Covering with heparin for now as she is to have dialysis access vascular surgery ? Monday Start eliquis after fistula /tunneled access surgery complete. Telemetry on dialysis this am NSR   #CHF:  EF 40-45% Volume managed with dialysis Minus 1.6 L's continue Toprol hydralazine Imdur Outpatient ischemic evaluation  #Left hip fracture -Status postrepair on 01/06/2021.  Doing well.     Jenkins Rouge MD [facility] Patient ID: [name], female   DOB: July 31, 1950, 70 y.o.   MRN: [id number]

## 2021-01-09 NOTE — Progress Notes (Signed)
ANTICOAGULATION CONSULT NOTE  Pharmacy Consult for Heparin Indication: atrial fibrillation  Allergies  Allergen Reactions   Lisinopril Other (See Comments)   Mercury Nausea And Vomiting    Patient Measurements: Height: '5\' 2"'$  (157.5 cm) Weight: 63.1 kg (139 lb 1.8 oz) IBW/kg (Calculated) : 50.1  Vital Signs: Temp: 98.8 F (37.1 C) (09/24 2010) Temp Source: Oral (09/24 2010) BP: 122/45 (09/24 2010) Pulse Rate: 72 (09/24 2010)  Labs: Recent Labs    01/07/21 0229 01/07/21 2200 01/08/21 0720 01/08/21 1600 01/09/21 0326  HGB 9.4*  --  7.9*  --  7.2*  HCT 30.9*  --  25.8*  --  23.4*  PLT 260  --  279  --  235  HEPARINUNFRC  --    < > 0.85* 0.92* 0.35  CREATININE 3.77*  --  4.83*  --  3.67*   < > = values in this interval not displayed.     Estimated Creatinine Clearance: 12.8 mL/min (A) (by C-G formula based on SCr of 3.67 mg/dL (H)).   Assessment: 70 y.o. female with new onset Afib started on IV heparin earlier this admit.  Patient had a nosebleed on 9/14 and therapeutic heparin was held since.  GI cleared patient to resume anticoagulation post EGD on 9/21 and she is also s/p IM nail L hip on 9/22.  Patient needs an AV fistula tentatively scheduled for 9/26, so Cards asked pharmacy to resume IV heparin.  Heparin level remains elevated at 0.92, on 600 units/hr. Confirmed drawn from opposite side - no s/sx of bleeding.  9/25 AM update:  Heparin level therapeutic  Hgb 7.2-watch   Goal of Therapy:  Heparin level 0.3-0.5 units/ml Monitor platelets by anticoagulation protocol: Yes   Plan:  -Cont heparin 500 units/hr -1200 heparin level -Watch Hgb closely   Narda Bonds, PharmD, BCPS Clinical Pharmacist Phone: [phone]

## 2021-01-09 NOTE — Progress Notes (Signed)
ANTICOAGULATION CONSULT NOTE  Pharmacy Consult for Heparin Indication: atrial fibrillation  Allergies  Allergen Reactions   Lisinopril Other (See Comments)   Mercury Nausea And Vomiting    Patient Measurements: Height: '5\' 2"'$  (157.5 cm) Weight: 63.1 kg (139 lb 1.8 oz) IBW/kg (Calculated) : 50.1  Vital Signs: BP: 120/49 (09/25 0606)  Labs: Recent Labs    01/07/21 0229 01/07/21 2200 01/08/21 0720 01/08/21 1600 01/09/21 0326 01/09/21 1147  HGB 9.4*  --  7.9*  --  7.2*  --   HCT 30.9*  --  25.8*  --  23.4*  --   PLT 260  --  279  --  235  --   HEPARINUNFRC  --    < > 0.85* 0.92* 0.35 0.33  CREATININE 3.77*  --  4.83*  --  3.67*  --    < > = values in this interval not displayed.     Estimated Creatinine Clearance: 12.8 mL/min (A) (by C-G formula based on SCr of 3.67 mg/dL (H)).   Assessment: 70 y.o. female with new onset Afib started on IV heparin earlier this admit.  Patient had a nosebleed on 9/14 and therapeutic heparin was held since.  GI cleared patient to resume anticoagulation post EGD on 9/21 and she is also s/p IM nail L hip on 9/22.  Patient needs an AV fistula tentatively scheduled for 9/26, so Cards asked pharmacy to resume IV heparin.  Recheck heparin level this afternoon is therapeutic at 0.33. This is the second consecutive therapeutic level. Hgb is currently 7.2 and Plts wnl.   Goal of Therapy:  Heparin level 0.3-0.5 units/ml Monitor platelets by anticoagulation protocol: Yes   Plan:  - Continue heparin 500 units/hr - Daily HL and CBC  - Monitor Hgb closely and for s/sx of bleed   Cephus Slater, PharmD, [facility] Pharmacy Resident [phone] 01/09/2021 1:03 PM

## 2021-01-09 NOTE — Progress Notes (Signed)
Heparin was hold from 1200H to 1700H. MD ordered to have the femoral dialysis access be removed, RN notified that we are not allowed to remove in our floor. MD came around 1400H to remove it but IV team was consulted as per protocol. RN called the IV team 3x as to the time of the removal but got no answer. MD was notified and ordered to restart the Heparin.

## 2021-01-10 ENCOUNTER — Encounter (HOSPITAL_COMMUNITY): Payer: Self-pay | Admitting: Internal Medicine

## 2021-01-10 ENCOUNTER — Inpatient Hospital Stay (HOSPITAL_COMMUNITY): Payer: Medicare Other

## 2021-01-10 ENCOUNTER — Inpatient Hospital Stay (HOSPITAL_COMMUNITY): Payer: Medicare Other | Admitting: Certified Registered Nurse Anesthetist

## 2021-01-10 ENCOUNTER — Encounter (HOSPITAL_COMMUNITY)
Admission: EM | Disposition: A | Discharge status: Home Health | Payer: Self-pay | Source: Home / Self Care | Attending: Family Medicine

## 2021-01-10 DIAGNOSIS — I480 Paroxysmal atrial fibrillation: Secondary | ICD-10-CM | POA: Diagnosis not present

## 2021-01-10 DIAGNOSIS — R778 Other specified abnormalities of plasma proteins: Secondary | ICD-10-CM | POA: Diagnosis not present

## 2021-01-10 DIAGNOSIS — N186 End stage renal disease: Secondary | ICD-10-CM

## 2021-01-10 DIAGNOSIS — J9621 Acute and chronic respiratory failure with hypoxia: Secondary | ICD-10-CM | POA: Diagnosis not present

## 2021-01-10 DIAGNOSIS — I742 Embolism and thrombosis of arteries of the upper extremities: Secondary | ICD-10-CM

## 2021-01-10 DIAGNOSIS — Z992 Dependence on renal dialysis: Secondary | ICD-10-CM

## 2021-01-10 DIAGNOSIS — J9622 Acute and chronic respiratory failure with hypercapnia: Secondary | ICD-10-CM | POA: Diagnosis not present

## 2021-01-10 DIAGNOSIS — I509 Heart failure, unspecified: Secondary | ICD-10-CM

## 2021-01-10 DIAGNOSIS — I132 Hypertensive heart and chronic kidney disease with heart failure and with stage 5 chronic kidney disease, or end stage renal disease: Secondary | ICD-10-CM | POA: Diagnosis not present

## 2021-01-10 DIAGNOSIS — E1122 Type 2 diabetes mellitus with diabetic chronic kidney disease: Secondary | ICD-10-CM

## 2021-01-10 HISTORY — PX: AV FISTULA PLACEMENT: SHX1204

## 2021-01-10 HISTORY — PX: REMOVAL OF A DIALYSIS CATHETER: SHX6053

## 2021-01-10 HISTORY — PX: THROMBECTOMY BRACHIAL ARTERY: SHX6649

## 2021-01-10 HISTORY — PX: INSERTION OF DIALYSIS CATHETER: SHX1324

## 2021-01-10 LAB — GLUCOSE, CAPILLARY
Glucose-Capillary: 112 mg/dL — ABNORMAL HIGH (ref 70–99)
Glucose-Capillary: 107 mg/dL — ABNORMAL HIGH (ref 70–99)
Glucose-Capillary: 124 mg/dL — ABNORMAL HIGH (ref 70–99)
Glucose-Capillary: 117 mg/dL — ABNORMAL HIGH (ref 70–99)
Glucose-Capillary: 91 mg/dL (ref 70–99)

## 2021-01-10 LAB — RENAL FUNCTION PANEL
Albumin: 2.2 g/dL — ABNORMAL LOW (ref 3.5–5.0)
Anion gap: 12 (ref 5–15)
BUN: 34 mg/dL — ABNORMAL HIGH (ref 8–23)
CO2: 24 mmol/L (ref 22–32)
Calcium: 7.9 mg/dL — ABNORMAL LOW (ref 8.9–10.3)
Chloride: 95 mmol/L — ABNORMAL LOW (ref 98–111)
Creatinine, Ser: 4.35 mg/dL — ABNORMAL HIGH (ref 0.44–1.00)
GFR, Estimated: 11 mL/min — ABNORMAL LOW (ref >60)
Glucose, Bld: 118 mg/dL — ABNORMAL HIGH (ref 70–99)
Phosphorus: 4.1 mg/dL (ref 2.5–4.6)
Potassium: 3.4 mmol/L — ABNORMAL LOW (ref 3.5–5.1)
Sodium: 131 mmol/L — ABNORMAL LOW (ref 135–145)

## 2021-01-10 LAB — CBC
HCT: 22.9 % — ABNORMAL LOW (ref 36.0–46.0)
Hemoglobin: 7.1 g/dL — ABNORMAL LOW (ref 12.0–15.0)
MCH: 26.8 pg (ref 26.0–34.0)
MCHC: 31 g/dL (ref 30.0–36.0)
MCV: 86.4 fL (ref 80.0–100.0)
Platelets: 277 10*3/uL (ref 150–400)
RBC: 2.65 MIL/uL — ABNORMAL LOW (ref 3.87–5.11)
RDW: 16.4 % — ABNORMAL HIGH (ref 11.5–15.5)
WBC: 8.1 10*3/uL (ref 4.0–10.5)
nRBC: 0 % (ref 0.0–0.2)

## 2021-01-10 LAB — ABO/RH: ABO/RH(D): B POS

## 2021-01-10 LAB — HEPARIN LEVEL (UNFRACTIONATED): Heparin Unfractionated: 0.11 IU/mL — ABNORMAL LOW (ref 0.30–0.70)

## 2021-01-10 LAB — PREPARE RBC (CROSSMATCH)

## 2021-01-10 SURGERY — ARTERIOVENOUS (AV) FISTULA CREATION
Anesthesia: General | Laterality: Right

## 2021-01-10 MED ORDER — HEPARIN 6000 UNIT IRRIGATION SOLUTION
Status: AC
  Filled 2021-01-10: qty 500

## 2021-01-10 MED ORDER — PROPOFOL 10 MG/ML IV BOLUS
INTRAVENOUS | Status: AC
  Filled 2021-01-10: qty 20

## 2021-01-10 MED ORDER — FENTANYL CITRATE (PF) 100 MCG/2ML IJ SOLN
25.0000 ug | INTRAMUSCULAR | Status: DC | PRN
  Administered 2021-01-10: 25 ug via INTRAVENOUS

## 2021-01-10 MED ORDER — PHENYLEPHRINE HCL-NACL 20-0.9 MG/250ML-% IV SOLN
INTRAVENOUS | Status: DC | PRN
  Administered 2021-01-10: 50 ug/min via INTRAVENOUS

## 2021-01-10 MED ORDER — HEPARIN SODIUM (PORCINE) 1000 UNIT/ML IJ SOLN
INTRAMUSCULAR | Status: AC
  Filled 2021-01-10: qty 1

## 2021-01-10 MED ORDER — FENTANYL CITRATE (PF) 250 MCG/5ML IJ SOLN
INTRAMUSCULAR | Status: AC
  Filled 2021-01-10: qty 5

## 2021-01-10 MED ORDER — MIDAZOLAM HCL 5 MG/5ML IJ SOLN
INTRAMUSCULAR | Status: DC | PRN
  Administered 2021-01-10 (×2): 1 mg via INTRAVENOUS

## 2021-01-10 MED ORDER — CHLORHEXIDINE GLUCONATE 0.12 % MT SOLN: 15.0000 mL | Freq: Once | OROMUCOSAL | Status: AC

## 2021-01-10 MED ORDER — APIXABAN 5 MG PO TABS
5.0000 mg | ORAL_TABLET | Freq: Two times a day (BID) | ORAL | Status: DC
  Administered 2021-01-11 – 2021-01-17 (×13): 5 mg via ORAL
  Filled 2021-01-10 (×13): qty 1

## 2021-01-10 MED ORDER — CHLORHEXIDINE GLUCONATE 0.12 % MT SOLN
OROMUCOSAL | Status: AC
  Administered 2021-01-10: 15 mL via OROMUCOSAL
  Filled 2021-01-10: qty 15

## 2021-01-10 MED ORDER — 0.9 % SODIUM CHLORIDE (POUR BTL) OPTIME
TOPICAL | Status: DC | PRN
  Administered 2021-01-10: 1000 mL

## 2021-01-10 MED ORDER — SODIUM CHLORIDE 0.9 % IV SOLN: INTRAVENOUS | Status: DC

## 2021-01-10 MED ORDER — CEFAZOLIN SODIUM-DEXTROSE 2-4 GM/100ML-% IV SOLN
INTRAVENOUS | Status: AC
  Filled 2021-01-10: qty 100

## 2021-01-10 MED ORDER — PROPOFOL 10 MG/ML IV BOLUS
INTRAVENOUS | Status: DC | PRN
  Administered 2021-01-10: 60 mg via INTRAVENOUS

## 2021-01-10 MED ORDER — FENTANYL CITRATE (PF) 100 MCG/2ML IJ SOLN: 50.0000 ug | INTRAMUSCULAR | Status: DC | PRN

## 2021-01-10 MED ORDER — HEPARIN SODIUM (PORCINE) 1000 UNIT/ML IJ SOLN
INTRAMUSCULAR | Status: DC | PRN
  Administered 2021-01-10: 5000 [IU] via INTRAVENOUS

## 2021-01-10 MED ORDER — LIDOCAINE HCL (PF) 2 % IJ SOLN
INTRAMUSCULAR | Status: AC
  Filled 2021-01-10: qty 5

## 2021-01-10 MED ORDER — ONDANSETRON HCL 4 MG/2ML IJ SOLN
INTRAMUSCULAR | Status: DC | PRN
  Administered 2021-01-10: 4 mg via INTRAVENOUS

## 2021-01-10 MED ORDER — FENTANYL CITRATE (PF) 100 MCG/2ML IJ SOLN
INTRAMUSCULAR | Status: AC
  Filled 2021-01-10: qty 2

## 2021-01-10 MED ORDER — FENTANYL CITRATE (PF) 100 MCG/2ML IJ SOLN
INTRAMUSCULAR | Status: DC | PRN
  Administered 2021-01-10 (×2): 25 ug via INTRAVENOUS

## 2021-01-10 MED ORDER — ROCURONIUM BROMIDE 10 MG/ML (PF) SYRINGE
PREFILLED_SYRINGE | INTRAVENOUS | Status: AC
  Filled 2021-01-10: qty 10

## 2021-01-10 MED ORDER — PHENYLEPHRINE HCL (PRESSORS) 10 MG/ML IV SOLN
INTRAVENOUS | Status: DC | PRN
  Administered 2021-01-10 (×2): 80 ug via INTRAVENOUS

## 2021-01-10 MED ORDER — MIDAZOLAM HCL 2 MG/2ML IJ SOLN
INTRAMUSCULAR | Status: AC
  Filled 2021-01-10: qty 2

## 2021-01-10 MED ORDER — LIDOCAINE 2% (20 MG/ML) 5 ML SYRINGE
INTRAMUSCULAR | Status: DC | PRN
  Administered 2021-01-10: 60 mg via INTRAVENOUS

## 2021-01-10 MED ORDER — HEPARIN SODIUM (PORCINE) 1000 UNIT/ML IJ SOLN
INTRAMUSCULAR | Status: DC | PRN
  Administered 2021-01-10: 3200 [IU]

## 2021-01-10 MED ORDER — PAPAVERINE HCL 30 MG/ML IJ SOLN
INTRAMUSCULAR | Status: AC
  Filled 2021-01-10: qty 2

## 2021-01-10 MED ORDER — ORAL CARE MOUTH RINSE: 15.0000 mL | Freq: Once | OROMUCOSAL | Status: AC

## 2021-01-10 MED ORDER — HEPARIN 6000 UNIT IRRIGATION SOLUTION
Status: DC | PRN
  Administered 2021-01-10: 1

## 2021-01-10 SURGICAL SUPPLY — 56 items
ARMBAND PINK RESTRICT EXTREMIT (MISCELLANEOUS) ×5 IMPLANT
BAG DECANTER FOR FLEXI CONT (MISCELLANEOUS) IMPLANT
BENZOIN TINCTURE PRP APPL 2/3 (GAUZE/BANDAGES/DRESSINGS) IMPLANT
BIOPATCH RED 1 DISK 7.0 (GAUZE/BANDAGES/DRESSINGS) ×5 IMPLANT
CANISTER SUCT 3000ML PPV (MISCELLANEOUS) ×5 IMPLANT
CANNULA VESSEL 3MM 2 BLNT TIP (CANNULA) ×5 IMPLANT
CATH EMB 4FR 40CM (CATHETERS) ×5 IMPLANT
CATH PALINDROME-P 19CM W/VT (CATHETERS) ×5 IMPLANT
CATH PALINDROME-P 23CM W/VT (CATHETERS) IMPLANT
CATH PALINDROME-P 28CM W/VT (CATHETERS) IMPLANT
CHLORAPREP W/TINT 26 (MISCELLANEOUS) ×10 IMPLANT
CLIP VESOCCLUDE MED 6/CT (CLIP) ×5 IMPLANT
CLIP VESOCCLUDE SM WIDE 6/CT (CLIP) ×5 IMPLANT
COVER PROBE W GEL 5X96 (DRAPES) ×5 IMPLANT
COVER SURGICAL LIGHT HANDLE (MISCELLANEOUS) ×5 IMPLANT
DERMABOND ADVANCED (GAUZE/BANDAGES/DRESSINGS) ×2
DERMABOND ADVANCED .7 DNX12 (GAUZE/BANDAGES/DRESSINGS) ×8 IMPLANT
DRAPE C-ARM 42X72 X-RAY (DRAPES) ×5 IMPLANT
DRAPE CHEST BREAST 15X10 FENES (DRAPES) ×5 IMPLANT
DRSG COVADERM 4X6 (GAUZE/BANDAGES/DRESSINGS) ×10 IMPLANT
ELECT REM PT RETURN 9FT ADLT (ELECTROSURGICAL) ×5
ELECTRODE REM PT RTRN 9FT ADLT (ELECTROSURGICAL) ×4 IMPLANT
GAUZE 4X4 16PLY ~~LOC~~+RFID DBL (SPONGE) ×5 IMPLANT
GAUZE SPONGE 4X4 12PLY STRL (GAUZE/BANDAGES/DRESSINGS) IMPLANT
GLOVE SURG POLYISO LF SZ8 (GLOVE) ×10 IMPLANT
GOWN STRL REUS W/ TWL LRG LVL3 (GOWN DISPOSABLE) ×16 IMPLANT
GOWN STRL REUS W/ TWL XL LVL3 (GOWN DISPOSABLE) ×8 IMPLANT
GOWN STRL REUS W/TWL LRG LVL3 (GOWN DISPOSABLE) ×20
GOWN STRL REUS W/TWL XL LVL3 (GOWN DISPOSABLE) ×10
INSERT FOGARTY SM (MISCELLANEOUS) IMPLANT
KIT BASIN OR (CUSTOM PROCEDURE TRAY) ×5 IMPLANT
KIT PALINDROME-P 55CM (CATHETERS) IMPLANT
KIT TURNOVER KIT B (KITS) ×5 IMPLANT
NEEDLE 18GX1X1/2 (RX/OR ONLY) (NEEDLE) IMPLANT
NEEDLE HYPO 25GX1X1/2 BEV (NEEDLE) IMPLANT
NS IRRIG 1000ML POUR BTL (IV SOLUTION) ×5 IMPLANT
PACK CV ACCESS (CUSTOM PROCEDURE TRAY) ×5 IMPLANT
PACK SURGICAL SETUP 50X90 (CUSTOM PROCEDURE TRAY) IMPLANT
PAD ARMBOARD 7.5X6 YLW CONV (MISCELLANEOUS) ×10 IMPLANT
SET MICROPUNCTURE 5F STIFF (MISCELLANEOUS) ×5 IMPLANT
SOAP 2 % CHG 4 OZ (WOUND CARE) IMPLANT
STRIP CLOSURE SKIN 1/2X4 (GAUZE/BANDAGES/DRESSINGS) IMPLANT
SUT ETHILON 3 0 PS 1 (SUTURE) ×5 IMPLANT
SUT MNCRL AB 4-0 PS2 18 (SUTURE) ×10 IMPLANT
SUT PROLENE 6 0 BV (SUTURE) ×10 IMPLANT
SUT VIC AB 3-0 SH 27 (SUTURE) ×5
SUT VIC AB 3-0 SH 27X BRD (SUTURE) ×4 IMPLANT
SYR 10ML LL (SYRINGE) ×5 IMPLANT
SYR 20ML LL LF (SYRINGE) ×10 IMPLANT
SYR 3ML LL SCALE MARK (SYRINGE) IMPLANT
SYR 5ML LL (SYRINGE) ×5 IMPLANT
SYR CONTROL 10ML LL (SYRINGE) ×5 IMPLANT
TOWEL GREEN STERILE (TOWEL DISPOSABLE) ×5 IMPLANT
TOWEL GREEN STERILE FF (TOWEL DISPOSABLE) ×10 IMPLANT
UNDERPAD 30X36 HEAVY ABSORB (UNDERPADS AND DIAPERS) ×5 IMPLANT
WATER STERILE IRR 1000ML POUR (IV SOLUTION) ×5 IMPLANT

## 2021-01-10 NOTE — Anesthesia Preprocedure Evaluation (Addendum)
Anesthesia Evaluation  Patient identified by MRN, date of birth, ID band Patient awake    Reviewed: Allergy & Precautions, NPO status , Patient's Chart, lab work & pertinent test results  Airway Mallampati: II  TM Distance: >3 FB Neck ROM: Full    Dental   Pulmonary neg pulmonary ROS,    breath sounds clear to auscultation       Cardiovascular hypertension, Pt. on medications + CAD, + Peripheral Vascular Disease and +CHF   Rhythm:Regular Rate:Normal  EF 40-45%   Neuro/Psych Metabolic and uremic encephalitis    GI/Hepatic Neg liver ROS, GERD  ,  Endo/Other  diabetes, Type 2  Renal/GU ESRF and DialysisRenal disease     Musculoskeletal   Abdominal   Peds  Hematology  (+) anemia ,   Anesthesia Other Findings   Reproductive/Obstetrics                             Lab Results  Component Value Date   WBC 8.1 01/10/2021   HGB 7.1 (L) 01/10/2021   HCT 22.9 (L) 01/10/2021   MCV 86.4 01/10/2021   PLT 277 01/10/2021   Lab Results  Component Value Date   CREATININE 4.35 (H) 01/10/2021   BUN 34 (H) 01/10/2021   NA 131 (L) 01/10/2021   K 3.4 (L) 01/10/2021   CL 95 (L) 01/10/2021   CO2 24 01/10/2021    Anesthesia Physical Anesthesia Plan  ASA: 4  Anesthesia Plan: General   Post-op Pain Management:    Induction: Intravenous  PONV Risk Score and Plan: 3 and Dexamethasone, Ondansetron and Treatment may vary due to age or medical condition  Airway Management Planned: LMA  Additional Equipment:   Intra-op Plan:   Post-operative Plan: Extubation in OR  Informed Consent: I have reviewed the patients History and Physical, chart, labs and discussed the procedure including the risks, benefits and alternatives for the proposed anesthesia with the patient or authorized representative who has indicated his/her understanding and acceptance.     Dental advisory given  Plan Discussed with:  CRNA  Anesthesia Plan Comments:        Anesthesia Quick Evaluation

## 2021-01-10 NOTE — Anesthesia Postprocedure Evaluation (Signed)
Anesthesia Post Note  Patient: Molly [name]  Procedure(s) Performed: LEFT ARM BRACHIOCEPHALIC  ARTERIOVENOUS (AV) FISTULA CREATION (Left) INSERTION OF TUNNELED DIALYSIS CATHETER RIGHT INTERNAL JUGULAR THROMBECTOMY LEFT CEPHALIC VEIN REMOVAL OF TEMPORARY DIALYSIS CATHETER RIGHT GROIN (Right)     Patient location during evaluation: PACU Anesthesia Type: General Level of consciousness: awake and alert Pain management: pain level controlled Vital Signs Assessment: post-procedure vital signs reviewed and stable Respiratory status: spontaneous breathing, nonlabored ventilation, respiratory function stable and patient connected to nasal cannula oxygen Cardiovascular status: blood pressure returned to baseline and stable Postop Assessment: no apparent nausea or vomiting Anesthetic complications: no   No notable events documented.  Last Vitals:  Vitals:   01/10/21 1426 01/10/21 1448  BP: (!) 142/46 (!) 139/47  Pulse: 74 73  Resp: 12 17  Temp:  36.7 C  SpO2: 100% 100%    Last Pain:  Vitals:   01/10/21 1448  TempSrc: Oral  PainSc:                  Tiajuana Amass

## 2021-01-10 NOTE — Progress Notes (Signed)
[facility] [place] PROGRESS NOTE  ASSESSMENT / PLAN: Molly [name] is a 70 y.o. female with new diagnosis of ESRD. Plan LUE AVF and TDC today in OR.   SUBJECTIVE: No complaints. Ready for surgery.  OBJECTIVE: BP (!) 149/41   Pulse 75   Temp 98.3 F (36.8 C) (Oral)   Resp 20   Ht '5\' 2"'$  (1.575 m)   Wt 66.8 kg   SpO2 100%   BMI 26.94 kg/m   Intake/Output Summary (Last 24 hours) at 01/10/2021 0856 Last data filed at 01/10/2021 0116 Gross per 24 hour  Intake 372.87 ml  Output --  Net 372.87 ml    Constitutional: well appearing. no acute distress. Cardiac: RRR. Pulmonary: unlabored Abdomen: soft, non-tender Vascular: left radial artery 2+  CBC Latest Ref Rng & Units 01/10/2021 01/09/2021 01/08/2021  WBC 4.0 - 10.5 K/uL 8.1 9.9 8.7  Hemoglobin 12.0 - 15.0 g/dL 7.1(L) 7.2(L) 7.9(L)  Hematocrit 36.0 - 46.0 % 22.9(L) 23.4(L) 25.8(L)  Platelets 150 - 400 K/uL 277 235 279     CMP Latest Ref Rng & Units 01/10/2021 01/09/2021 01/08/2021  Glucose 70 - 99 mg/dL 118(H) 137(H) 134(H)  BUN 8 - 23 mg/dL 34(H) 25(H) 39(H)  Creatinine 0.44 - 1.00 mg/dL 4.35(H) 3.67(H) 4.83(H)  Sodium 135 - 145 mmol/L 131(L) 134(L) 135  Potassium 3.5 - 5.1 mmol/L 3.4(L) 3.5 4.1  Chloride 98 - 111 mmol/L 95(L) 97(L) 99  CO2 22 - 32 mmol/L '24 28 25  '$ Calcium 8.9 - 10.3 mg/dL 7.9(L) 8.1(L) 8.3(L)  Total Protein 6.5 - 8.1 g/dL - - -  Total Bilirubin 0.3 - 1.2 mg/dL - - -  Alkaline Phos 38 - 126 U/L - - -  AST 15 - 41 U/L - - -  ALT 0 - 44 U/L - - -    Estimated Creatinine Clearance: 11.1 mL/min (A) (by C-G formula based on SCr of 4.35 mg/dL (H)).  Yevonne Aline. Stanford Breed, MD [facility] Phone Number: [phone] 01/10/2021 8:56 AM

## 2021-01-10 NOTE — Progress Notes (Signed)
Progress Note  Patient Name: Molly [name] Date of Encounter: 01/10/2021  Primary Cardiologist:   Jenne Campus, MD   Subjective   She is sleepy but wakes up and answers questions.  Denies pain or SOB.   Inpatient Medications    Scheduled Meds:  acetaminophen  1,000 mg Oral TID   amiodarone  200 mg Oral Daily   [START ON 01/11/2021] apixaban  5 mg Oral BID   Chlorhexidine Gluconate Cloth  6 each Topical Daily   Chlorhexidine Gluconate Cloth  6 each Topical Q0600   cilostazol  50 mg Oral BID   diclofenac Sodium  2 g Topical QID   docusate sodium  100 mg Oral BID   ezetimibe  10 mg Oral Daily   famotidine  20 mg Oral Daily   feeding supplement (NEPRO CARB STEADY)  237 mL Oral BID BM   fentaNYL       FLUoxetine  10 mg Oral Daily   hydrALAZINE  25 mg Oral Q8H   insulin aspart  0-6 Units Subcutaneous TID WC   isosorbide mononitrate  30 mg Oral Daily   lidocaine  1 patch Transdermal Q24H   metoprolol succinate  50 mg Oral Daily   multivitamin  1 tablet Oral QHS   pantoprazole  40 mg Oral QHS   rosuvastatin  10 mg Oral Daily   sodium chloride flush  3 mL Intravenous Q12H   Continuous Infusions:  sodium chloride     sodium chloride     sodium chloride     sodium chloride 10 mL/hr at 01/06/21 1409   ceFAZolin     PRN Meds: sodium chloride, sodium chloride, sodium chloride, acetaminophen, alteplase, heparin, hydrALAZINE, hydrocortisone cream, HYDROmorphone (DILAUDID) injection, HYDROmorphone (DILAUDID) injection, lidocaine (PF), lidocaine-prilocaine, loperamide, loratadine, menthol-cetylpyridinium **OR** phenol, metoCLOPramide **OR** metoCLOPramide (REGLAN) injection, Muscle Rub, naLOXone (NARCAN)  injection, oxyCODONE, pentafluoroprop-tetrafluoroeth, sodium chloride, sodium chloride flush   Vital Signs    Vitals:   01/10/21 1300 01/10/21 1345 01/10/21 1426 01/10/21 1448  BP: (!) 120/43 (!) 112/35 (!) 142/46 (!) 139/47  Pulse: 73 71 74 73  Resp: '11 10 12 17  '$ Temp:  98.1 F (36.7 C) 97.7 F (36.5 C)  98 F (36.7 C)  TempSrc:    Oral  SpO2: 98% 99% 100% 100%  Weight:      Height:        Intake/Output Summary (Last 24 hours) at 01/10/2021 1617 Last data filed at 01/10/2021 1500 Gross per 24 hour  Intake 531.04 ml  Output 50 ml  Net 481.04 ml   Filed Weights   01/08/21 1115 01/10/21 0426 01/10/21 0853  Weight: 63.1 kg 66.8 kg 66.8 kg    Telemetry    NSR - Personally Reviewed  ECG    NA - Personally Reviewed  Physical Exam   GEN: No acute distress.   Neck: No  JVD Cardiac: RRR, no murmurs, rubs, or gallops.  Respiratory: Clear  to auscultation bilaterally. GI: Soft, nontender, non-distended  MS: No  edema; No deformity. Neuro:  Nonfocal  Psych: Normal affect   Labs    Chemistry Recent Labs  Lab 01/05/21 0500 01/06/21 0335 01/08/21 0720 01/09/21 0326 01/10/21 0456  NA 133*   < > 135 134* 131*  K 3.6   < > 4.1 3.5 3.4*  CL 96*   < > 99 97* 95*  CO2 26   < > '25 28 24  '$ GLUCOSE 122*   < > 134* 137* 118*  BUN 34*   < >  39* 25* 34*  CREATININE 4.01*   < > 4.83* 3.67* 4.35*  CALCIUM 8.2*   < > 8.3* 8.1* 7.9*  PROT 6.1*  --   --   --   --   ALBUMIN 2.4*   < > 2.2* 2.1* 2.2*  AST 86*  --   --   --   --   ALT 79*  --   --   --   --   ALKPHOS 91  --   --   --   --   BILITOT 0.6  --   --   --   --   GFRNONAA 12*   < > 9* 13* 11*  ANIONGAP 11   < > '11 9 12   '$ < > = values in this interval not displayed.     Hematology Recent Labs  Lab 01/08/21 0720 01/09/21 0326 01/10/21 0456  WBC 8.7 9.9 8.1  RBC 2.94* 2.68* 2.65*  HGB 7.9* 7.2* 7.1*  HCT 25.8* 23.4* 22.9*  MCV 87.8 87.3 86.4  MCH 26.9 26.9 26.8  MCHC 30.6 30.8 31.0  RDW 16.8* 16.9* 16.4*  PLT 279 235 277    Cardiac EnzymesNo results for input(s): TROPONINI in the last 168 hours. No results for input(s): TROPIPOC in the last 168 hours.   BNPNo results for input(s): BNP, PROBNP in the last 168 hours.   DDimer No results for input(s): DDIMER in the last 168  hours.   Radiology    DG CHEST PORT 1 VIEW  Result Date: 01/10/2021 CLINICAL DATA:  Tunnel dialysis EXAM: PORTABLE CHEST 1 VIEW COMPARISON:  03/28/2021 FINDINGS: Large-bore dialysis catheter with tip in the RIGHT atrium. Low lung volumes. Mild central venous congestion which is improved from comparison exam. Mild LEFT basilar atelectasis. No pneumothorax IMPRESSION:.  NO PNEUMOTHORAX: IMPRESSION:.  NO PNEUMOTHORAX 1. Interval placement dialysis catheter without complication. 2. Improvement venous congestion. Electronically Signed   By: Suzy Bouchard M.D.   On: 01/10/2021 14:31   DG Fluoro Guide CV Line-No Report  Result Date: 01/10/2021 Fluoroscopy was utilized by the requesting physician.  No radiographic interpretation.    Cardiac Studies   1. Compared with the echo [id number], systolic function is reduced. Left  ventricular ejection fraction, by estimation, is 40 to 45%. The left  ventricle has mildly decreased function. The left ventricle demonstrates  global hypokinesis. Left ventricular  diastolic parameters are indeterminate.   2. Right ventricular systolic function is normal. The right ventricular  size is normal. There is moderately elevated pulmonary artery systolic  pressure.   3. The pericardial effusion is circumferential.   4. The mitral valve is normal in structure. Mild mitral valve  regurgitation. No evidence of mitral stenosis.   5. The aortic valve is tricuspid. There is mild calcification of the  aortic valve. There is mild thickening of the aortic valve. Aortic valve  regurgitation is not visualized. No aortic stenosis is present.   6. The inferior vena cava is dilated in size with <50% respiratory  variability, suggesting right atrial pressure of 15 mmHg.   Patient Profile     70 y.o. female with history of CKD stage IV, diastolic heart failure, PAD (bilateral SFA CTO's), left renal artery stenosis (90%), diabetes, CAD who was admitted on 12/27/2020 with chest  pain and acute hypoxic respiratory failure secondary to pulmonary edema from likely hypertensive crisis.  Course complicated by A. fib with RVR 12/28/2020.  She is also developed AKI requiring hemodialysis.  She did suffer  a fall this admission that resulted in a left hip fracture.  This has been repaired.  Course also complicated by anemia which has been evaluated by GI with no evidence of active bleeding.  Assessment & Plan    A. fib with RVR:   Converted to NSR on amio.  Loaded for 7 days then 200 mg for 3 weeks.  We plan to discontinue after this.  Start Eliquis restarted.    CHF:  EF 40-45% Volume managed with dialysis.   Continue medical management.     For questions or updates, please contact [place] Please consult [email or web address] for contact info under Cardiology/STEMI.   Signed, Minus Breeding, MD  01/10/2021, 4:17 PM

## 2021-01-10 NOTE — Progress Notes (Signed)
PROGRESS NOTE    Molly [name]  [id number] DOB: [birth year] DOA: 12/27/2020 PCP: [name], MD   Brief Narrative:  70 year old female with past medical history of hypertension, left renal artery stenosis (A999333) diastolic CHF, CKD stage IV, CAD, PAD with bilateral SFA CTO's, anemia chronic disease, diabetes presented on 12/27/2020 with 4 days of chest pain.  BP was uncontrolled and spiked as high as 234/208 in the ED.  Chest x-ray consistent with pulmonary edema.  Patient briefly required BiPAP. Admitted to [facility] service with cardiology consulted.  Patient developed A. fib with RVR on 9/13.  Fell in the bathroom and left hip fracture. Kidney functions continue to worsen and started on hemodialysis.  Hospital course complicated with ongoing depression, pain issues and poor appetite that is slowly improving now.   Assessment & Plan:   Principal Problem:   Acute on chronic respiratory failure with hypoxia and hypercapnia (HCC) Active Problems:   HTN (hypertension)   Goals of care, counseling/discussion   Type 2 diabetes mellitus with stage 3b chronic kidney disease, with long-term current use of insulin (HCC)   Elevated troponin   Acute on chronic heart failure with preserved ejection fraction (HFpEF) ([place])   CAP (community acquired pneumonia)   Anemia   Abnormal CT of the abdomen   Acute postoperative pain of left hip  Acute metabolic and uremic encephalopathy: Also multifactorial lethargy.  Complications from medications.  Uremia.  Clinically improving. Avoiding other sedating agents.  Pain managed with low-dose oxycodone and intermittent Dilaudid doses.  Expect she will come off Dilaudid next few days and ready for rehab.  Acute respiratory failure with hypoxia secondary to recurrent flash pulmonary edema/acute systolic congestive heart failure: Echo shows reduced ejection fraction to 40 to 45% compared to the previous.  Now being managed by dialysis.  Stable.  Fall in  hospital/left hip fracture: CT left hip indicates left hip fracture.  S/p cephalomedullary nail of the left hip fracture by Dr. Lucia Gaskins 01/06/2021.   Weightbearing as tolerated. Work with PT OT.  Refer to rehab. DVT prophylaxis, she is on heparin with plan to go on therapeutic anticoagulation with Eliquis after surgery today.  Hypertensive emergency with chest pain: Improved.  Currently on hydralazine 25 mg p.o. 3 times daily Imdur to 30 mg p.o. daily, Toprol-XL 50 mg p.o. daily.  Left renal artery stenosis: Has a history of 80 to 90% of proximal left renal artery stenosis.  Plan was to do angiogram and stent but due to rising creatinine, this plan was kept on hold.  Cardiology and nephrology following.  Further plans unclear at this point in time now that she has been started on dialysis and per nephrology, this is likely going to be long-term. If becomes ESRD, may not benefit with any stenting.  New onset A. fib with RVR: Developed overnight 12/28/2020.  She was initially started on amiodarone drip and converted back to sinus rhythm. hemoglobin has remained stable since 12/30/2020.  Patient also underwent EGD 01/05/2021, no signs of bleeding.  GI cleared her for anticoagulation.  She is cleared by orthopedics for anticoagulation.  Patient is scheduled to have AV fistula today by vascular surgery.  Cardiology has started her on heparin now and will transition her to Eliquis after that procedure.  Possible gastroduodenitis: CT abdomen suggests possible gastroduodenitis.  Status post EGD 01/05/2021, no signs of gastroduodenitis.  Remains on PPI.  AKI on CKD stage IV/progressive renal failure/obstructive uropathy/left ureteral stone/left hydronephrosis/dross hematuria:  CT renal stone shows 6 mm  of left ureteral stone causing obstruction/hydronephrosis.  Patient underwent cystoscopy with left ureteral stent placement on 12/30/2020.  TDC was placed on 01/01/2021 and she received first session of dialysis and  second on 01/03/2021.  Nephrology consulting with [facility] for permanent access.  They plan for AV fistula on 01/10/2021 and conversion of the [employer] as well. Right femoral catheter should come out.  Community-acquired pneumonia: Resolved.  Elevated troponin/demand ischemia: Resolved.  Type 2 diabetes mellitus: Recent hemoglobin A1c 8.2% on 11/27/2020.  Home regimen includes Humulin 70/30 mix 8 to 12 units daily.Blood sugar has remained controlled on SSI since then. Once her appetite improves, will add long-acting insulin.  Dyslipidemia: Continue Zetia and Crestor.  PAD: Continue statin and Pletal.  Anemia of chronic disease: Hemoglobin has remained stable since 12/30/2020.  Slow drifting of hemoglobin is probably anticipated.  Does not have evidence of active bleeding.  CT renal stone indicates possible gastritis or gastric ulcer.  GI on board.  Hemoglobin more than 7.  May benefit with iron transfusions.  Will defer to nephrology.  Nutrition Status: Patient with poor oral intake.  Was on renal diet.  Not eating even 25% of the meal.  At risk of severe protein calorie malnutrition.  Will liberalize diet, changed to regular diet so that she can have some choices and flavor to start eating.   Goal of care: Patient with extensive issues, complications and now on hemodialysis.  Also with unrelenting pain issues, depression and frustrations.  Seen by palliative care at the bedside. Started patient on low-dose oxycodone, also started her on Prozac 10 mg daily.  She has much improvement on her mood and is motivated today. Changing to regular diet did help to eat some food and have some flavor.  She expect to get dialysis tomorrow after upper extremity surgery today, she expects to get out of the bed to chair and then transferred to rehab next few days.  DVT prophylaxis:    Code Status: Full Code  Family Communication: Patient's husband present at bedside.    Status is: Inpatient  Remains inpatient  appropriate because:Ongoing diagnostic testing needed not appropriate for outpatient work up  Dispo: The patient is from: Home              Anticipated d/c is to: Acute inpatient rehab.              Patient currently is not medically stable to d/c.   Difficult to place patient No        Estimated body mass index is 26.94 kg/m as calculated from the following:   Height as of this encounter: '5\' 2"'$  (1.575 m).   Weight as of this encounter: 66.8 kg.     Consultants:  Nephrology Cardiology Orthopedic Urology-signed off GI Palliative  Procedures:  As above  Antimicrobials:  Anti-infectives (From admission, onward)    Start     Dose/Rate Route Frequency Ordered Stop   01/07/21 0230  ceFAZolin (ANCEF) IVPB 2g/100 mL premix        2 g 200 mL/hr over 30 Minutes Intravenous Every 12 hours 01/06/21 1711 01/07/21 0252   01/06/21 2100  ceFAZolin (ANCEF) IVPB 2g/100 mL premix  Status:  Discontinued        2 g 200 mL/hr over 30 Minutes Intravenous Every 6 hours 01/06/21 1649 01/06/21 1711   01/06/21 1400  ceFAZolin (ANCEF) IVPB 2g/100 mL premix        2 g 200 mL/hr over 30 Minutes Intravenous On call to  O.R. 01/05/21 1926 01/06/21 1441   12/31/20 1330  cefTRIAXone (ROCEPHIN) 1 g in sodium chloride 0.9 % 100 mL IVPB        1 g 200 mL/hr over 30 Minutes Intravenous  Once 12/31/20 1315 12/31/20 1350   12/31/20 1000  cefTRIAXone (ROCEPHIN) 1 g in sodium chloride 0.9 % 100 mL IVPB  Status:  Discontinued        1 g 200 mL/hr over 30 Minutes Intravenous Every 24 hours 12/30/20 1202 12/31/20 1315   12/27/20 1530  cefTRIAXone (ROCEPHIN) 2 g in sodium chloride 0.9 % 100 mL IVPB  Status:  Discontinued        2 g 200 mL/hr over 30 Minutes Intravenous Every 24 hours 12/27/20 1435 12/30/20 1202          Subjective: Patient seen and examined.  Husband at the bedside.  She was so happy to have pain relief with oxycodone, she slept very well.  She tells me that change in regimen including  oral pain medication and changing to regular diet helped her a lot. She was very appreciative today of the care we provided and is motivated to work with therapist after surgery today.  Objective: Vitals:   01/09/21 2059 01/10/21 0426 01/10/21 0622 01/10/21 0734  BP: (!) 125/52  (!) 121/53 (!) 134/46  Pulse: 72   (!) 102  Resp: 18   17  Temp: 98 F (36.7 C)   98.5 F (36.9 C)  TempSrc: Oral   Oral  SpO2: 100%   93%  Weight:  66.8 kg    Height:        Intake/Output Summary (Last 24 hours) at 01/10/2021 0817 Last data filed at 01/10/2021 0116 Gross per 24 hour  Intake 572.87 ml  Output --  Net 572.87 ml   Filed Weights   01/08/21 0831 01/08/21 1115 01/10/21 0426  Weight: 64.9 kg 63.1 kg 66.8 kg    Examination:  General: Frail.  Debilitated.  Chronically sick looking.  On room air. cardiovascular: S1-S2 normal.  Regular rate rhythm. Respiratory: Bilateral clear. Gastrointestinal: Soft and nontender.  Bowel sounds present. Ext:  Right femoral temporary dialysis catheter present. Left lateral thigh incision clean and dry.  Distal neurovascular status intact. Skin pigmentations both legs. Neuro: No focal deficits today.  Alert awake and oriented. Patient is in good mood today.     Data Reviewed: I have personally reviewed following labs and imaging studies  CBC: Recent Labs  Lab 01/06/21 0335 01/07/21 0229 01/08/21 0720 01/09/21 0326 01/10/21 0456  WBC 7.9 9.3 8.7 9.9 8.1  HGB 8.5* 9.4* 7.9* 7.2* 7.1*  HCT 26.2* 30.9* 25.8* 23.4* 22.9*  MCV 84.8 87.5 87.8 87.3 86.4  PLT 223 260 279 235 [id number]   Basic Metabolic Panel: Recent Labs  Lab 01/03/21 1815 01/03/21 2053 01/04/21 0435 01/04/21 1732 01/05/21 0500 01/06/21 0335 01/07/21 0229 01/08/21 0720 01/09/21 0326 01/10/21 0456  NA  --    < > 133*  --  133* 134* 136 135 134* 131*  K  --    < > 3.6  --  3.6 3.6 3.8 4.1 3.5 3.4*  CL  --    < > 96*  --  96* 98 101 99 97* 95*  CO2  --    < > 25  --  '26 28 25 25  28 24  '$ GLUCOSE  --    < > 146*  --  122* 121* 132* 134* 137* 118*  BUN  --    < >  30*  --  34* 18 26* 39* 25* 34*  CREATININE  --    < > 3.39*  --  4.01* 2.90* 3.77* 4.83* 3.67* 4.35*  CALCIUM  --    < > 8.0*  --  8.2* 8.0* 8.3* 8.3* 8.1* 7.9*  MG 1.9  --  1.9 1.9 1.9  --   --   --   --   --   PHOS  --    < > 3.0  --  2.9 2.9 4.0 4.4 3.8 4.1   < > = values in this interval not displayed.   GFR: Estimated Creatinine Clearance: 11.1 mL/min (A) (by C-G formula based on SCr of 4.35 mg/dL (H)). Liver Function Tests: Recent Labs  Lab 01/05/21 0500 01/06/21 0335 01/07/21 0229 01/08/21 0720 01/09/21 0326 01/10/21 0456  AST 86*  --   --   --   --   --   ALT 79*  --   --   --   --   --   ALKPHOS 91  --   --   --   --   --   BILITOT 0.6  --   --   --   --   --   PROT 6.1*  --   --   --   --   --   ALBUMIN 2.4* 2.3* 2.5* 2.2* 2.1* 2.2*   No results for input(s): LIPASE, AMYLASE in the last 168 hours. No results for input(s): AMMONIA in the last 168 hours. Coagulation Profile: No results for input(s): INR, PROTIME in the last 168 hours. Cardiac Enzymes: No results for input(s): CKTOTAL, CKMB, CKMBINDEX, TROPONINI in the last 168 hours. BNP (last 3 results) No results for input(s): PROBNP in the last 8760 hours. HbA1C: No results for input(s): HGBA1C in the last 72 hours. CBG: Recent Labs  Lab 01/08/21 2042 01/09/21 0749 01/09/21 1239 01/09/21 1657 01/09/21 2046  GLUCAP 165* 124* 137* 160* 132*   Lipid Profile: No results for input(s): CHOL, HDL, LDLCALC, TRIG, CHOLHDL, LDLDIRECT in the last 72 hours. Thyroid Function Tests: No results for input(s): TSH, T4TOTAL, FREET4, T3FREE, THYROIDAB in the last 72 hours. Anemia Panel: No results for input(s): VITAMINB12, FOLATE, FERRITIN, TIBC, IRON, RETICCTPCT in the last 72 hours. Sepsis Labs: No results for input(s): PROCALCITON, LATICACIDVEN in the last 168 hours.   Recent Results (from the past 240 hour(s))  Surgical pcr screen      Status: None   Collection Time: 01/05/21  7:42 PM   Specimen: Nasal Mucosa; Nasal Swab  Result Value Ref Range Status   MRSA, PCR NEGATIVE NEGATIVE Final   Staphylococcus aureus NEGATIVE NEGATIVE Final    Comment: (NOTE) The Xpert SA Assay (FDA approved for NASAL specimens in patients 34 years of age and older), is one component of a comprehensive surveillance program. It is not intended to diagnose infection nor to guide or monitor treatment. Performed at [facility], [place] [address] [place], [place] [address]        Radiology Studies: No results found.  Scheduled Meds:  acetaminophen  1,000 mg Oral TID   amiodarone  200 mg Oral Daily   Chlorhexidine Gluconate Cloth  6 each Topical Daily   Chlorhexidine Gluconate Cloth  6 each Topical Q0600   cilostazol  50 mg Oral BID   diclofenac Sodium  2 g Topical QID   docusate sodium  100 mg Oral BID   ezetimibe  10 mg Oral Daily   famotidine  20 mg Oral Daily   feeding supplement (NEPRO CARB STEADY)  237 mL Oral BID BM   FLUoxetine  10 mg Oral Daily   hydrALAZINE  25 mg Oral Q8H   insulin aspart  0-6 Units Subcutaneous TID WC   isosorbide mononitrate  30 mg Oral Daily   lidocaine  1 patch Transdermal Q24H   metoprolol succinate  50 mg Oral Daily   multivitamin  1 tablet Oral QHS   pantoprazole  40 mg Oral QHS   rosuvastatin  10 mg Oral Daily   sodium chloride flush  3 mL Intravenous Q12H   Continuous Infusions:  sodium chloride     sodium chloride     sodium chloride     sodium chloride 10 mL/hr at 01/06/21 1409   heparin 500 Units/hr (01/10/21 0116)     LOS: 14 days   Time spent: 30 minutes   Barb Merino, MD [facility]  01/10/2021, 8:17 AM

## 2021-01-10 NOTE — Anesthesia Procedure Notes (Signed)
Procedure Name: LMA Insertion Date/Time: 01/10/2021 11:11 AM Performed by: Terrence Dupont, CRNA Pre-anesthesia Checklist: Patient identified, Emergency Drugs available, Suction available and Patient being monitored Patient Re-evaluated:Patient Re-evaluated prior to induction Oxygen Delivery Method: Circle system utilized Preoxygenation: Pre-oxygenation with 100% oxygen Induction Type: IV induction Ventilation: Mask ventilation without difficulty LMA: LMA inserted LMA Size: 4.0 Tube type: Oral Number of attempts: 1 Airway Equipment and Method: Stylet and Oral airway Placement Confirmation: ETT inserted through vocal cords under direct vision, positive ETCO2 and breath sounds checked- equal and bilateral Tube secured with: Tape Dental Injury: Teeth and Oropharynx as per pre-operative assessment

## 2021-01-10 NOTE — Telephone Encounter (Signed)
No order for sleep study was placed.  Pt still has the appt scheduled for 09/29.will sign off of this message.

## 2021-01-10 NOTE — Progress Notes (Addendum)
    Palliative Medicine Progress Note  Medical records reviewed. Patient assessed at the bedside upon her return from PACU. No family is present during my visit. She is sleeping, arouses briefly to my voice then falls back asleep. She does not appear to be in pain or distress.   PMT will continue to follow for ongoing pain management and goals of care conversations.   NO CHARGE  Retaj Hilbun Burt Knack, [facility] Palliative Medicine Team Team phone # [phone]

## 2021-01-10 NOTE — Progress Notes (Signed)
[facility] Progress Note   70 year old lady with history of hypertension left renal artery stenosis [id number] diastolic dysfunction chronic kidney disease stage IV peripheral artery disease bilateral  superficial femoral artery obstruction.  Anemia, diabetes.  Presented 12/27/2020 with chest pain.  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 Now s/p left ureteral stent placement but with no improvement. -HD started for uremia & failed lasix challenge. Rt fem temp line placed with IR on 9/18.  Hemodialysis started 01/01/2021.  Her last dialysis treatment was on 01/09/2023 off schedule due to high dialysis volumes.  The next dialysis treatment be 01/11/2021.  Patient for AV fistula +  IJ TC on 01/10/2021.    Will continue to monitor for signs of renal recovery but patient had poor renal reserve and UOP not great at this time. Plan for HD Tues.   2.Hematuria Left obstructive uropathy-  -in the setting of mildly obstructing 6 mm distal left ureter renal stone seen on CT urogram. S/p left ureteral stent on 9/15. ANCA and lupus serologies wnl. Appreciate urology's assistance   3.Intertrochanteric hip fracture- s/p fall in the hospital.  -ortho on board, preop clearance for repair     4.Epistaxis- overnight 9/14, since resolved. Thought to be due to nasal canula.  Anca serologies neg   5.Left renal artery stenosis- Hx of 80-90% proximal left renal artery stenosis.   6.Hypertension- BP well controlled on norvasc, metoprolol, and hydralazine.  Ultrafiltration with dialysis blood pressure control improved   7.Hyponatremia-resolved   8.Afib w/ RVR-on amio. Hep gtt on hold given epistaxis, which has resolved.   9.Anemia due to chronic kidney disease-Hgb stable at 8.3   10.Diabetes Mellitus Type 2 with  Hyperglycemia- -per primary  Subjective:   C/o pain especially right ankle but denies dyspnea, fever, chills   Objective:   BP (!) 121/53   Pulse 72   Temp 98 F (36.7 C) (Oral)   Resp 18   Ht '5\' 2"'$  (1.575 m)   Wt 66.8 kg   SpO2 100%   BMI 26.94 kg/m   Intake/Output Summary (Last 24 hours) at 01/10/2021 [id number] Last data filed at 01/10/2021 0116 Gross per 24 hour  Intake 572.87 ml  Output --  Net 572.87 ml   Weight change: 1.9 kg  Physical Exam: Gen: Calm with spouse bedside, laying flat in bed CVS:RRR, no murmurs Resp: trace bibasilar, normal WOB [id number], nontender Ext:no edema, rt fem temp cath Neuro: pleasant this AM and  moving all ext spontaneously  Imaging: No results found.  Labs: BMET Recent Labs  Lab 01/04/21 0435 01/05/21 0500 01/06/21 0335 01/07/21 0229 01/08/21 0720 01/09/21 0326 01/10/21 0456  NA 133* 133* 134* 136 135 134* 131*  K 3.6 3.6 3.6 3.8 4.1 3.5 3.4*  CL 96* 96* 98 101 99 97* 95*  CO2 '25 26 28 25 25 28 24  '$ GLUCOSE 146* 122* 121* 132* 134* 137* 118*  BUN 30* 34* 18 26* 39* 25* 34*  CREATININE 3.39* 4.01* 2.90* 3.77* 4.83* 3.67* 4.35*  CALCIUM 8.0* 8.2* 8.0* 8.3* 8.3* 8.1* 7.9*  PHOS 3.0 2.9 2.9 4.0 4.4 3.8 4.1   CBC Recent Labs  Lab 01/07/21 0229 01/08/21 0720 01/09/21 0326 01/10/21 0456  WBC 9.3 8.7 9.9 8.1  HGB 9.4* 7.9* 7.2* 7.1*  HCT 30.9* 25.8* 23.4* 22.9*  MCV 87.5 87.8 87.3 86.4  PLT 260 279 235 277    Medications:     acetaminophen  1,000 mg Oral TID   amiodarone  200 mg Oral Daily   Chlorhexidine Gluconate Cloth  6 each Topical Daily   Chlorhexidine Gluconate Cloth  6 each Topical Q0600   cilostazol  50 mg Oral BID   diclofenac Sodium  2 g Topical QID   docusate sodium  100 mg Oral BID   ezetimibe  10 mg Oral Daily   famotidine  20 mg Oral Daily   feeding supplement (NEPRO CARB STEADY)  237 mL Oral BID BM   FLUoxetine  10 mg Oral Daily   hydrALAZINE  25 mg Oral Q8H   insulin aspart  0-6 Units Subcutaneous  TID WC   isosorbide mononitrate  30 mg Oral Daily   lidocaine  1 patch Transdermal Q24H   metoprolol succinate  50 mg Oral Daily   multivitamin  1 tablet Oral QHS   pantoprazole  40 mg Oral QHS   rosuvastatin  10 mg Oral Daily   sodium chloride flush  3 mL Intravenous Q12H      Otelia Santee, MD 01/10/2021, 7:21 AM

## 2021-01-10 NOTE — Progress Notes (Signed)
ANTICOAGULATION CONSULT NOTE  Pharmacy Consult for Heparin Indication: atrial fibrillation  Allergies  Allergen Reactions   Lisinopril Other (See Comments)   Mercury Nausea And Vomiting    Patient Measurements: Height: '5\' 2"'$  (157.5 cm) Weight: 66.8 kg (147 lb 4.3 oz) IBW/kg (Calculated) : 50.1  Vital Signs: Temp: 98 F (36.7 C) (09/26 1448) Temp Source: Oral (09/26 1448) BP: 139/47 (09/26 1448) Pulse Rate: 73 (09/26 1448)  Labs: Recent Labs    01/08/21 0720 01/08/21 1600 01/09/21 0326 01/09/21 1147 01/10/21 0413 01/10/21 0456  HGB 7.9*  --  7.2*  --   --  7.1*  HCT 25.8*  --  23.4*  --   --  22.9*  PLT 279  --  235  --   --  277  HEPARINUNFRC 0.85*   < > 0.35 0.33 0.11*  --   CREATININE 4.83*  --  3.67*  --   --  4.35*   < > = values in this interval not displayed.     Estimated Creatinine Clearance: 11.1 mL/min (A) (by C-G formula based on SCr of 4.35 mg/dL (H)).   Assessment: 70 y.o. female with new onset Afib started on IV heparin earlier this admit.  Patient had a nosebleed on 9/14 and therapeutic heparin was held since.  GI cleared patient to resume anticoagulation post EGD on 9/21 and she is also s/p IM nail L hip on 9/22.  Patient needs an AV fistula tentatively scheduled for 9/26, so Cards asked pharmacy to resume IV heparin.  Underwent R IJ tunneled dialysis catheter, L brachiocephalic AV fistula creation, and L cephalic vein thrombectomy 9/26. Plan to restart apixaban on 9/27 AM - qualifies for full dose given wt>60 kg and age<80.   Goal of Therapy:  Heparin level 0.3-0.5 units/ml Monitor platelets by anticoagulation protocol: Yes   Plan:  - Will order apixaban 5 mg BID starting 9/27 in AM - Monitor CBC and for s/sx of bleeding - watch wt (has been in lower 60 kg range in the past)  Antonietta Jewel, PharmD, [place] Pharmacist  Phone: [phone] 01/10/2021 3:50 PM  Please check AMION for all [place] phone numbers After 10:00 PM, call [place] [phone]

## 2021-01-10 NOTE — Progress Notes (Signed)
PT Cancellation Note  Patient Details Name: Molly [name] MRN: [id number] DOB: 10-20-1950   Cancelled Treatment:    Reason Eval/Treat Not Completed: Patient at procedure or test/unavailable (Pt in PACU still.  Will return tomorrow.)   [name] 01/10/2021, 1:07 PM Ova Meegan M,PT Acute Rehab Services [phone] (pager)

## 2021-01-10 NOTE — Op Note (Signed)
DATE OF SERVICE: 01/10/2021  PATIENT:  Molly [name]  70 y.o. female  PRE-OPERATIVE DIAGNOSIS:  new diagnosis ESRD  POST-OPERATIVE DIAGNOSIS:  Same  PROCEDURE:   1) right internal jugular vein tunneled dialysis catheter (19cm) 2) left brachiocephalic arteriovenous fistula creation 3) left cephalic vein thrombectomy  SURGEON:  Surgeon(s) and Role:    * Cherre Robins, MD - Primary  ASSISTANT: Paulo Fruit, PA-C  An assistant was required to facilitate exposure and expedite the case.  ANESTHESIA:   general  EBL: min  BLOOD ADMINISTERED:none  DRAINS: none   LOCAL MEDICATIONS USED:  LIDOCAINE   SPECIMEN:  none  COUNTS: confirmed correct.  TOURNIQUET:  none  PATIENT DISPOSITION:  PACU - hemodynamically stable.   Delay start of Pharmacological VTE agent (>24hrs) due to surgical blood loss or risk of bleeding: no  INDICATION FOR PROCEDURE: LAKERIA KNABEL is a 70 y.o. female with new diagnosis of ESRD in need of permanent HD access. After careful discussion of risks, benefits, and alternatives the patient was offered left upper extremity fistula and tunneled dialysis catheter placement. The patient understood and wished to proceed.  OPERATIVE FINDINGS: chronically thrombosed cephalic vein with successful thrombectomy. Unremarkable brachiocephalic arteriovenous fistula creation.  DESCRIPTION OF PROCEDURE: After identification of the patient in the pre-operative holding area, the patient was transferred to the operating room. The patient was positioned supine on the operating room table. Anesthesia was induced. The left arm was prepped and draped in standard fashion. A surgical pause was performed confirming correct patient, procedure, and operative location.  Using intraoperative ultrasound, the course of the left upper extremity superficial veins was mapped.  The cephalic vein appeared adequate for arteriovenous fistula creation.  The brachial artery was similarly mapped.  The  artery appeared adequate for arterial venous creation. We ensured there was no anomalous arterial anatomy such as a high bifurcation.  A transverse incision was made in the left arm just distal to the antecubital crease.  Incision was carried down through subcutaneous tissue until the cephalic vein was identified and skeletonized.  We continued our exposure through the aponeurosis of the biceps.  The brachial artery was encountered its usual position.  The artery was circumferentially exposed and encircled with Silastic Vesseloops.  Patient was systemically heparinized.  The distal cephalic vein was transected. Thrombectomy of the cephalic vein was performed with a #4 fogarty embolectomy balloon catheter with good result. The distal stump of the cephalic vein was oversewn with a 2-0 silk suture.  The proximal vein was controlled with a bulldog clamp.  The brachial artery was clamped proximally medially.  An anterior arteriotomy was made with a 11 blade.  The arteriotomy was extended with Potts scissors.  Using a parachute technique the cephalic vein was anastomosed to the brachial arteriotomy in end-to-side fashion with continuous running suture of 6-0 Prolene.  Immediately prior to completion the anastomosis was flushed and de-aired.  The anastomosis was completed.  Hemostasis was assured.  The fistula was interrogated with Doppler.  Audible bruit was heard throughout the course of the cephalic vein.  A radial artery signal was heard which augmented slightly with compression of the fistula.  Upon completion of the case instrument and sharps counts were confirmed correct. The patient was transferred to the PACU in good condition. I was present for all portions of the procedure.  Yevonne Aline. Stanford Breed, MD [facility] Phone Number: [phone] 01/10/2021 12:30 PM

## 2021-01-10 NOTE — Progress Notes (Signed)
Spoke to pt's husband via phone this am. Husband states that pt would like to receive HD in the [place] area upon d/c. Advised pt's husband again that that will mean that pt will be seen by a new nephrologist. Husband voices understanding and agrees to new MD with clinic placement in [place] area. Referral made to [facility] admissions. Pt's d/c plan may affect pt's out-pt clinic at d/c. Will follow and assist as needed.    Melven Sartorius Renal Navigator  [phone]

## 2021-01-10 NOTE — Progress Notes (Signed)
PT Cancellation Note  Patient Details Name: Molly [name] MRN: [id number] DOB: 1951-02-14   Cancelled Treatment:    Reason Eval/Treat Not Completed: Patient at procedure or test/unavailable (Pt in surgery. Will return as able.)   [name] 01/10/2021, 8:53 AM Annisten Manchester M,PT Acute Rehab Services [phone] (pager)

## 2021-01-10 NOTE — Transfer of Care (Signed)
Immediate Anesthesia Transfer of Care Note  Patient: BLODWEN BUERGER  Procedure(s) Performed: LEFT ARM BRACHIOCEPHALIC  ARTERIOVENOUS (AV) FISTULA CREATION (Left) INSERTION OF TUNNELED DIALYSIS CATHETER RIGHT INTERNAL JUGULAR THROMBECTOMY LEFT CEPHALIC VEIN REMOVAL OF TEMPORARY DIALYSIS CATHETER RIGHT GROIN (Right)  Patient Location: PACU  Anesthesia Type:General  Level of Consciousness: awake and alert   Airway & Oxygen Therapy: Patient Spontanous Breathing and Patient connected to face mask oxygen  Post-op Assessment: Report given to RN and Post -op Vital signs reviewed and stable  Post vital signs: Reviewed  Last Vitals:  Vitals Value Taken Time  BP 120/43 01/10/21 1257  Temp    Pulse 74 01/10/21 1259  Resp 12 01/10/21 1259  SpO2 100 % 01/10/21 1259  Vitals shown include unvalidated device data.  Last Pain:  Vitals:   01/10/21 0909  TempSrc:   PainSc: 0-No pain      Patients Stated Pain Goal: 1 ([phone])  Complications: No notable events documented.

## 2021-01-11 ENCOUNTER — Encounter (HOSPITAL_COMMUNITY): Payer: Self-pay | Admitting: Vascular Surgery

## 2021-01-11 DIAGNOSIS — D631 Anemia in chronic kidney disease: Secondary | ICD-10-CM

## 2021-01-11 DIAGNOSIS — I132 Hypertensive heart and chronic kidney disease with heart failure and with stage 5 chronic kidney disease, or end stage renal disease: Secondary | ICD-10-CM

## 2021-01-11 DIAGNOSIS — E1122 Type 2 diabetes mellitus with diabetic chronic kidney disease: Secondary | ICD-10-CM

## 2021-01-11 DIAGNOSIS — W19XXXA Unspecified fall, initial encounter: Secondary | ICD-10-CM

## 2021-01-11 DIAGNOSIS — I509 Heart failure, unspecified: Secondary | ICD-10-CM

## 2021-01-11 DIAGNOSIS — N186 End stage renal disease: Secondary | ICD-10-CM

## 2021-01-11 DIAGNOSIS — Z992 Dependence on renal dialysis: Secondary | ICD-10-CM

## 2021-01-11 LAB — RENAL FUNCTION PANEL
Albumin: 2.2 g/dL — ABNORMAL LOW (ref 3.5–5.0)
Anion gap: 13 (ref 5–15)
BUN: 42 mg/dL — ABNORMAL HIGH (ref 8–23)
CO2: 22 mmol/L (ref 22–32)
Calcium: 8 mg/dL — ABNORMAL LOW (ref 8.9–10.3)
Chloride: 98 mmol/L (ref 98–111)
Creatinine, Ser: 4.93 mg/dL — ABNORMAL HIGH (ref 0.44–1.00)
GFR, Estimated: 9 mL/min — ABNORMAL LOW (ref >60)
Glucose, Bld: 102 mg/dL — ABNORMAL HIGH (ref 70–99)
Phosphorus: 5.2 mg/dL — ABNORMAL HIGH (ref 2.5–4.6)
Potassium: 3.8 mmol/L (ref 3.5–5.1)
Sodium: 133 mmol/L — ABNORMAL LOW (ref 135–145)

## 2021-01-11 LAB — CBC
HCT: 24.3 % — ABNORMAL LOW (ref 36.0–46.0)
Hemoglobin: 7.6 g/dL — ABNORMAL LOW (ref 12.0–15.0)
MCH: 27 pg (ref 26.0–34.0)
MCHC: 31.3 g/dL (ref 30.0–36.0)
MCV: 86.5 fL (ref 80.0–100.0)
Platelets: 349 10*3/uL (ref 150–400)
RBC: 2.81 MIL/uL — ABNORMAL LOW (ref 3.87–5.11)
RDW: 16.7 % — ABNORMAL HIGH (ref 11.5–15.5)
WBC: 11.1 10*3/uL — ABNORMAL HIGH (ref 4.0–10.5)
nRBC: 0.2 % (ref 0.0–0.2)

## 2021-01-11 LAB — GLUCOSE, CAPILLARY
Glucose-Capillary: 88 mg/dL (ref 70–99)
Glucose-Capillary: 77 mg/dL (ref 70–99)
Glucose-Capillary: 147 mg/dL — ABNORMAL HIGH (ref 70–99)
Glucose-Capillary: 146 mg/dL — ABNORMAL HIGH (ref 70–99)

## 2021-01-11 MED ORDER — POLYETHYLENE GLYCOL 3350 17 G PO PACK
17.0000 g | PACK | Freq: Every day | ORAL | Status: DC
  Administered 2021-01-11 – 2021-01-16 (×5): 17 g via ORAL
  Filled 2021-01-11 (×6): qty 1

## 2021-01-11 MED ORDER — GLYCERIN (LAXATIVE) 2.1 G RE SUPP
1.0000 | RECTAL | Status: DC | PRN
  Filled 2021-01-11: qty 1

## 2021-01-11 MED ORDER — SENNA 8.6 MG PO TABS: 1.0000 | ORAL_TABLET | Freq: Every day | ORAL | Status: DC | PRN

## 2021-01-11 MED ORDER — PROSOURCE PLUS PO LIQD
30.0000 mL | Freq: Three times a day (TID) | ORAL | Status: DC
  Administered 2021-01-11 – 2021-01-16 (×7): 30 mL via ORAL
  Filled 2021-01-11 (×9): qty 30

## 2021-01-11 NOTE — Progress Notes (Signed)
PROGRESS NOTE  Molly [name]    DOB: 1950-06-09, 70 y.o.  [id number]  PCP: [name], MD   Code Status: Full Code   DOA: 12/27/2020   LOS: 65  Brief Narrative of Current Hospitalization  Molly [name] is a 70 y.o. female with a PMH significant for HTN, left renal artery stenosis, CHF, CKD stage IV now end-stage, CAD, PAD with bilateral SFA CTO's, anemia of chronic disease, diabetes. They presented from home to the ED on 12/27/2020 with chest pain x 4 days. In the ED, it was found that they had fluid overload and A. fib with RVR. They were treated with BiPAP and medical management per cardiology.  Additionally, during hospitalization, patient had fall with left hip fracture and worsening kidney function transitioned her to start hemodialysis this admission. Patient was admitted to medicine service for further workup and management of complex care of respiratory failure, end-stage kidney, hip fracture as outlined in detail below.  Palliative care, vascular surgery, nephrology, cardiology, Ortho also consulted this admission.  01/11/21 -received HD. Tolerated well  Assessment & Plan  Principal Problem:   Acute on chronic respiratory failure with hypoxia and hypercapnia (HCC) Active Problems:   HTN (hypertension)   Goals of care, counseling/discussion   Type 2 diabetes mellitus with stage 3b chronic kidney disease, with long-term current use of insulin (HCC)   Elevated troponin   Acute on chronic heart failure with preserved ejection fraction (HFpEF) ([place])   CAP (community acquired pneumonia)   Anemia   Abnormal CT of the abdomen   Acute postoperative pain of left hip  Acute on chronic respiratory failure with hypoxia and hypercapnia-resolved.  Patient is on room air per charting but was on 2.5L [place] when I interviewed her this afternoon.  - contacted nurse to update vitals.  - wean to room air as tolerated.   Hematuria 2/2 obstructing ureteral stone on left. Asymptomatic. -  f/u urology  A. fib with RVR-rate controlled -Continue amiodarone load x7 days total, followed by 200 mg for 3 weeks -Cardiology following -Continue Eliquis  End-stage renal disease now on HD T,Th,S schedule. Tolerated HD well today.  -Vascular following for right IJ insertion 9/26 and monitoring fistula.  Patient will need to follow-up outpatient in 4 to 6 weeks for fistula duplex (around October 31) - RFP am  Left hip fracture-patient recovering well. -PT/OT and weightbearing as tolerated -Follow-up with Ortho postop 2 weeks for suture removal and x-rays.  (Around October 7) - TOC consulted for SNF placement  DVT prophylaxis:  apixaban (ELIQUIS) tablet 5 mg   Diet:  Diet Orders (From admission, onward)     Start     Ordered   01/10/21 1444  Diet renal/carb modified with fluid restriction Fluid restriction: 1200 mL Fluid; Room service appropriate? Yes; Fluid consistency: Thin  Diet effective now       Question Answer Comment  Fluid restriction: 1200 mL Fluid   Room service appropriate? Yes   Fluid consistency: Thin      01/10/21 1444            Subjective 01/11/21    Pt reports doing well but feels sleepy. She tolerated HD well and was able to eat afterwards. No complaints today.  Disposition Plan & Communication  Status is: Inpatient  Remains inpatient appropriate because:Ongoing diagnostic testing needed not appropriate for outpatient work up  Dispo: The patient is from: Home              Anticipated  d/c is to: Home              Patient currently is not medically stable to d/c.   Difficult to place patient No  Family Communication: husband at bedside   Consults, Procedures, Significant Events  Consultants:  Nephrology Ortho  Urology   Procedures/significant events:  HD Urethral stent Internal hip fixation  Antimicrobials:  Anti-infectives (From admission, onward)    Start     Dose/Rate Route Frequency Ordered Stop   01/10/21 0904  ceFAZolin  (ANCEF) 2-4 GM/100ML-% IVPB       Note to Pharmacy: Molly [name]   : cabinet override      01/10/21 0904 01/10/21 2114   01/07/21 0230  ceFAZolin (ANCEF) IVPB 2g/100 mL premix        2 g 200 mL/hr over 30 Minutes Intravenous Every 12 hours 01/06/21 1711 01/07/21 0252   01/06/21 2100  ceFAZolin (ANCEF) IVPB 2g/100 mL premix  Status:  Discontinued        2 g 200 mL/hr over 30 Minutes Intravenous Every 6 hours 01/06/21 1649 01/06/21 1711   01/06/21 1400  ceFAZolin (ANCEF) IVPB 2g/100 mL premix        2 g 200 mL/hr over 30 Minutes Intravenous On call to O.R. 01/05/21 1926 01/06/21 1441   12/31/20 1330  cefTRIAXone (ROCEPHIN) 1 g in sodium chloride 0.9 % 100 mL IVPB        1 g 200 mL/hr over 30 Minutes Intravenous  Once 12/31/20 1315 12/31/20 1350   12/31/20 1000  cefTRIAXone (ROCEPHIN) 1 g in sodium chloride 0.9 % 100 mL IVPB  Status:  Discontinued        1 g 200 mL/hr over 30 Minutes Intravenous Every 24 hours 12/30/20 1202 12/31/20 1315   12/27/20 1530  cefTRIAXone (ROCEPHIN) 2 g in sodium chloride 0.9 % 100 mL IVPB  Status:  Discontinued        2 g 200 mL/hr over 30 Minutes Intravenous Every 24 hours 12/27/20 1435 12/30/20 1202        Objective   Vitals:   01/10/21 1426 01/10/21 1448 01/10/21 2100 01/11/21 0600  BP: (!) 142/46 (!) 139/47 (!) 94/59 (!) 106/43  Pulse: 74 73 77 78  Resp: '12 17 18   '$ Temp:  98 F (36.7 C) 98.4 F (36.9 C)   TempSrc:  Oral Oral   SpO2: 100% 100% 98% 100%  Weight:      Height:        Intake/Output Summary (Last 24 hours) at 01/11/2021 0844 Last data filed at 01/10/2021 1500 Gross per 24 hour  Intake 408.17 ml  Output 50 ml  Net 358.17 ml   Filed Weights   01/08/21 1115 01/10/21 0426 01/10/21 0853  Weight: 63.1 kg 66.8 kg 66.8 kg    Patient BMI: Body mass index is 26.94 kg/m.   Physical Exam: General: awake, alert, NAD HEENT: atraumatic, clear conjunctiva, anicteric sclera, moist mucus membranes, hearing grossly normal Respiratory:   normal respiratory effort. Cardiovascular: normal S1/S2,  RRR, no JVD, murmurs, rubs, gallops, quick capillary refill  Gastrointestinal: soft, NT, ND, no HSM felt Nervous: A&O x4. no gross focal neurologic deficits, normal speech Extremities: moves all equally, no edema, normal tone Skin: dry, intact, normal temperature, normal color, No rashes, lesions or ulcers Psychiatry: normal mood, congruent affect, judgement and insight appear normal  Labs   I have personally reviewed following labs and imaging studies No results displayed because visit has over 200 results.  Recent Results (from the past 240 hour(s))  Surgical pcr screen     Status: None   Collection Time: 01/05/21  7:42 PM   Specimen: Nasal Mucosa; Nasal Swab  Result Value Ref Range Status   MRSA, PCR NEGATIVE NEGATIVE Final   Staphylococcus aureus NEGATIVE NEGATIVE Final    Comment: (NOTE) The Xpert SA Assay (FDA approved for NASAL specimens in patients 28 years of age and older), is one component of a comprehensive surveillance program. It is not intended to diagnose infection nor to guide or monitor treatment. Performed at [facility], [place] [address], [place], [place] [address]      Imaging Studies  DG CHEST PORT 1 VIEW  Result Date: 01/10/2021 CLINICAL DATA:  Tunnel dialysis EXAM: PORTABLE CHEST 1 VIEW COMPARISON:  03/28/2021 FINDINGS: Large-bore dialysis catheter with tip in the RIGHT atrium. Low lung volumes. Mild central venous congestion which is improved from comparison exam. Mild LEFT basilar atelectasis. No pneumothorax IMPRESSION:.  NO PNEUMOTHORAX: IMPRESSION:.  NO PNEUMOTHORAX 1. Interval placement dialysis catheter without complication. 2. Improvement venous congestion. Electronically Signed   By: Suzy Bouchard M.D.   On: 01/10/2021 14:31   DG Fluoro Guide CV Line-No Report  Result Date: 01/10/2021 Fluoroscopy was utilized by the requesting physician.  No radiographic interpretation.    Medications   Scheduled Meds:  acetaminophen  1,000 mg Oral TID   amiodarone  200 mg Oral Daily   apixaban  5 mg Oral BID   Chlorhexidine Gluconate Cloth  6 each Topical Daily   Chlorhexidine Gluconate Cloth  6 each Topical Q0600   cilostazol  50 mg Oral BID   diclofenac Sodium  2 g Topical QID   docusate sodium  100 mg Oral BID   ezetimibe  10 mg Oral Daily   famotidine  20 mg Oral Daily   feeding supplement (NEPRO CARB STEADY)  237 mL Oral BID BM   FLUoxetine  10 mg Oral Daily   hydrALAZINE  25 mg Oral Q8H   insulin aspart  0-6 Units Subcutaneous TID WC   isosorbide mononitrate  30 mg Oral Daily   lidocaine  1 patch Transdermal Q24H   metoprolol succinate  50 mg Oral Daily   multivitamin  1 tablet Oral QHS   pantoprazole  40 mg Oral QHS   rosuvastatin  10 mg Oral Daily   sodium chloride flush  3 mL Intravenous Q12H   Continuous Infusions:  sodium chloride     sodium chloride     sodium chloride     sodium chloride 10 mL/hr at 01/06/21 1409     LOS: 15 days   Time spent: >18mn   Molly Bubeck L Kregg Cihlar, DO [facility] 01/11/2021, 8:44 AM   To contact the TL[facility]lakeAttending or Consulting provider for this patient: Check the care team in [facility] LLCfor a) attending/consulting TAlamoprovider listed and b) the T[facility]pteam listed Log into [email or web address] and use [place]'s universal password to access. If you do not have the password, please contact the hospital operator. Locate the TBahamas Surgery Centerprovider you are looking for under [facility] and page to a number that you can be directly reached. If you still have difficulty reaching the provider, please page the DF[facility](Director on Call) for the Hospitalists listed on amion for assistance.

## 2021-01-11 NOTE — Progress Notes (Addendum)
    Called to bedside with report of no flow in left UE cephalic fistula. DR. Augustin Coupe in HD could not feel a thrill in the fistula.  [facility] working well.  Left arm tenderness, no abnormal edema and good grip Doppler flow in fistula with pulsatile thrill in the fistula. Doppler radial and ulnar signals intact.   S/P  PROCEDURE:   1) right internal jugular vein tunneled dialysis catheter (19cm) 2) left brachiocephalic arteriovenous fistula creation 3) left cephalic vein thrombectomy  We will exam her tomorrow. No symptoms of steal  Molly Horseman PA-C [phone] DR. Hawken pager [phone]  Agree with above. The fistula has an audible bruit. Her arm is tender from surgery yesterday. She has a new catheter. I would give the fistula some time to see how it does.   Gae Gallop, MD 2:39 PM

## 2021-01-11 NOTE — Progress Notes (Signed)
PT Cancellation Note  Patient Details Name: Molly [name] MRN: [id number] DOB: 1950-10-12   Cancelled Treatment:    Reason Eval/Treat Not Completed: Patient at procedure or test/unavailable (Pt in HD.  Will return as able.)   [name] 01/11/2021, 8:48 AM Emonni Depasquale M,PT Acute Rehab Services [phone] (pager)

## 2021-01-11 NOTE — Progress Notes (Signed)
Nutrition Follow-up  DOCUMENTATION CODES:   Not applicable  INTERVENTION:   -Nepro Shake po BID, each supplement provides 425 kcal and 19 grams protein  -30 ml Prosource Plus TID, each supplement provides 100 kcals and 15 grams protein -Renal MVI daily -Last BM on 01/05/21; messaged MD regarding bowel regimen  NUTRITION DIAGNOSIS:   Inadequate oral intake related to inability to eat as evidenced by NPO status.  Progressing; advanced to PO diet on 01/05/21  GOAL:   Patient will meet greater than or equal to 90% of their needs  Progressing   MONITOR:   Diet advancement, Labs, Weight trends, TF tolerance, Skin  REASON FOR ASSESSMENT:   Consult Assessment of nutrition requirement/status  ASSESSMENT:   Molly [name] is a 70 y.o. female with medical history significant of HTN, diastolic CHF, CKD stage IV, PAD, anemia of chronic kidney disease, and diabetes mellitus type 2 presents with complaints of a chest pain for the last 3 to 4 days.  Patient reports having a right substernal chest heaviness.  Noted associated symptoms of shortness of breath, orthopnea, insomnia, weight gain of approximately 5 pounds in the last week, nausea, vomiting, and belching.  Denies any fever, cough, leg swelling, abdominal pain, or dysuria symptoms.  She had tried taking extra dose of furosemide for 2 days without any change in symptoms.  Previously admitted last month for acute on hypercapnic respiratory failure with hypoxia secondary to diastolic heart failure exacerbation.  9/20- advanced to heart healthy, carb modified diet 9/21- s/p EGD- revealed non-obstructing Schatzki ring; hiatal hernia, Hill grade III gastroesophageal flap valve; hiatal hernia; erythematous mucosa in stomach (biopsied)  9/22- s/p PROCEDURE: Cephalomedullary nail of Left hip fracture 9/26- s/p PROCEDURE:   1) right internal jugular vein tunneled dialysis catheter (19cm) 2) left brachiocephalic arteriovenous fistula  creation 3) left cephalic vein thrombectomy   Reviewed I/O's: +358 ml x 24 hours and -862 ml since 12/28/20  Pt out of room at time of visit. No family present at bedside.   Pt with variable oral intake. Noted meal completion 0-75%. Pt also with variable acceptance of Nepro supplements.   Medications reviewed and include colace. Last documented BM on 01/05/21. Sent message to MD regarding bowel regimen.   Therapy recommending CIR at d/c.   Labs reviewed: Na: 133, CBGS: 88-117 (inpatient orders for glycemic control are 0-6 units insulin aspart TID with meals).    Diet Order:   Diet Order             Diet renal/carb modified with fluid restriction Fluid restriction: 1200 mL Fluid; Room service appropriate? Yes; Fluid consistency: Thin  Diet effective now                   EDUCATION NEEDS:   Education needs have been addressed  Skin:  Skin Assessment: Skin Integrity Issues: Skin Integrity Issues:: Incisions Incisions: closed rt groin, lt arm, lt hip, perineum  Last BM:  01/05/21  Height:   Ht Readings from Last 1 Encounters:  01/10/21 '5\' 2"'$  (1.575 m)    Weight:   Wt Readings from Last 1 Encounters:  01/11/21 64.4 kg    Ideal Body Weight:  50 kg  BMI:  Body mass index is 25.97 kg/m.  Estimated Nutritional Needs:   Kcal:  [id number]  Protein:  100-115 grams  Fluid:  1000 ml + UOP    Loistine Chance, RD, LDN, [place] Registered Dietitian II Certified Diabetes Care and Education Specialist Please refer to [facility] for  RD and/or RD on-call/weekend/after hours pager

## 2021-01-11 NOTE — Progress Notes (Addendum)
  Progress Note    01/11/2021 7:43 AM 1 Day Post-Op  Subjective:  incisional pain in left AC   Vitals:   01/10/21 2100 01/11/21 0600  BP: (!) 94/59 (!) 106/43  Pulse: 77 78  Resp: 18   Temp: 98.4 F (36.9 C)   SpO2: 98% 100%   Physical Exam: Cardiac:  regular Lungs:  non labored Incisions:  left AC incision is intact, clean and dry. Mild swelling around incision, soft. No hematoma Extremities:  well perfused and warm. Palpable 2+ radial pulses bilaterally. Grip strength 5/5 Neurologic: alert and oriented  CBC    Component Value Date/Time   WBC 11.1 (H) 01/11/2021 0059   RBC 2.81 (L) 01/11/2021 0059   HGB 7.6 (L) 01/11/2021 0059   HGB 12.5 08/19/2019 1111   HCT 24.3 (L) 01/11/2021 0059   HCT 39.4 08/19/2019 1111   PLT 349 01/11/2021 0059   PLT 299 08/19/2019 1111   MCV 86.5 01/11/2021 0059   MCV 86 08/19/2019 1111   MCH 27.0 01/11/2021 0059   MCHC 31.3 01/11/2021 0059   RDW 16.7 (H) 01/11/2021 0059   RDW 12.7 08/19/2019 1111   LYMPHSABS 1.3 11/26/2020 1226   MONOABS 0.5 11/26/2020 1226   EOSABS 0.1 11/26/2020 1226   BASOSABS 0.0 11/26/2020 1226    BMET    Component Value Date/Time   NA 133 (L) 01/11/2021 0059   NA 141 09/03/2019 1600   K 3.8 01/11/2021 0059   CL 98 01/11/2021 0059   CO2 22 01/11/2021 0059   GLUCOSE 102 (H) 01/11/2021 0059   BUN 42 (H) 01/11/2021 0059   BUN 24 09/03/2019 1600   CREATININE 4.93 (H) 01/11/2021 0059   CALCIUM 8.0 (L) 01/11/2021 0059   GFRNONAA 9 (L) 01/11/2021 0059   GFRAA 59 (L) 09/03/2019 1600    INR No results found for: INR   Intake/Output Summary (Last 24 hours) at 01/11/2021 0743 Last data filed at 01/10/2021 1500 Gross per 24 hour  Intake 408.17 ml  Output 50 ml  Net 358.17 ml     Assessment/Plan:  70 y.o. female is s/p insertion of Right IJ TDC and creation of left brachiocephalic AV fistula 1 Day Post-Op   Doing well post op. Mild incisional pain. Incision is well appearing and intact Mild swelling  of forearm. Encouraged her to elevate her left arm She has no signs of symptoms of steal She will have follow up in our office in 4-6 weeks with fistula duplex   Karoline Caldwell, PA-C [facility] [phone] 01/11/2021 7:43 AM  VASCULAR STAFF ADDENDUM: I have independently interviewed and examined the patient. I agree with the above.   Yevonne Aline. Stanford Breed, MD [facility] Phone Number: [phone] 01/11/2021 8:48 AM

## 2021-01-11 NOTE — Progress Notes (Addendum)
Pt assigned to [place] on TTS schedule. Pt will need to arrive at 10:00 for 10:25 chair time. On pt's first appt, pt will need to arrive at 9:25 in order to complete paperwork prior to first treatment. Will attempt to meet with pt and pt's husband this afternoon to discuss the above. Pt's d/c plan TBD. Will follow and assist.   Melven Sartorius Renal Navigator [phone]   Addendum at 2:57 pm: Met with pt and pt's husband at bedside. Provided resource sheet with pt's days/time documented. Also provided clinic's name, address, and phone number as well. Discussed arrangements briefly as pt requested that we talk more later due to pt needing to use the restroom. Will f/u with pt at a later time.

## 2021-01-11 NOTE — Progress Notes (Signed)
[facility] Progress Note   70 year old lady with history of hypertension left renal artery stenosis [id number] diastolic dysfunction chronic kidney disease stage IV peripheral artery disease bilateral  superficial femoral artery obstruction.  Anemia, diabetes.  Presented 12/27/2020 with chest pain.  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 Now s/p left ureteral stent placement but with no improvement. -HD started for uremia & failed lasix challenge. Rt fem temp line placed with IR on 9/18.  Hemodialysis started 01/01/2021.  Her last dialysis treatment was on 01/09/2023 off schedule due to high dialysis volumes.  The next dialysis treatment be 01/11/2021.  Patient for AV fistula +  IJ TC on 01/10/2021.    Seen on HD 01/11/21 Through RIJ TC 104/43 2 L net UF as tolerated on 3K bath  Next HD Thur  Likely will need CIR but will CLIP.   2.Hematuria Left obstructive uropathy-  -in the setting of mildly obstructing 6 mm distal left ureter renal stone seen on CT urogram. S/p left ureteral stent on 9/15. ANCA and lupus serologies wnl. Appreciate urology's assistance   3.Intertrochanteric hip fracture- s/p fall in the hospital.  -ortho on board, preop clearance for repair     4.Epistaxis- overnight 9/14, since resolved. Thought to be due to nasal canula.  Anca serologies neg   5.Left renal artery stenosis- Hx of 80-90% proximal left renal artery stenosis.   6.Hypertension- BP well controlled on norvasc, metoprolol, and hydralazine.  Ultrafiltration with dialysis blood pressure control improved   7.Hyponatremia-resolved   8.Afib w/ RVR-on amio. Hep gtt on hold given epistaxis, which has resolved.   9.Anemia due to chronic kidney disease-Hgb stable at 7.6   10.Diabetes Mellitus Type 2 with  Hyperglycemia- -per primary  Subjective:   C/o pain especially right ankle but denies dyspnea, fever, chills   Objective:   BP (!) 112/43   Pulse 67   Temp 97.7 F (36.5 C) (Oral)   Resp 16   Ht '5\' 2"'$  (1.575 m)   Wt 64.4 kg   SpO2 100%   BMI 25.97 kg/m   Intake/Output Summary (Last 24 hours) at 01/11/2021 1202 Last data filed at 01/10/2021 1500 Gross per 24 hour  Intake 358.17 ml  Output 50 ml  Net 308.17 ml   Weight change: 0 kg  Physical Exam: Gen: Calm with spouse bedside, laying flat in bed CVS:RRR, no murmurs Resp: trace bibasilar, normal WOB [id number], nontender Ext:no edema, RIJ TC Neuro: pleasant this AM and  moving all ext spontaneously  Imaging: DG CHEST PORT 1 VIEW  Result Date: 01/10/2021 CLINICAL DATA:  Tunnel dialysis EXAM: PORTABLE CHEST 1 VIEW COMPARISON:  03/28/2021 FINDINGS: Large-bore dialysis catheter with tip in the RIGHT atrium. Low lung volumes. Mild central venous congestion which is improved from comparison exam. Mild LEFT basilar atelectasis. No pneumothorax IMPRESSION:.  NO PNEUMOTHORAX: IMPRESSION:.  NO PNEUMOTHORAX 1. Interval placement dialysis catheter without complication. 2. Improvement venous congestion. Electronically Signed   By: Suzy Bouchard M.D.   On: 01/10/2021 14:31   DG Fluoro Guide CV Line-No Report  Result Date: 01/10/2021 Fluoroscopy was utilized by the requesting physician.  No radiographic interpretation.    Labs: BMET Recent Labs  Lab 01/05/21 0500 01/06/21 0335 01/07/21 0229 01/08/21 0720 01/09/21 0326 01/10/21 0456 01/11/21 0059  NA 133* 134* 136 135 134* 131* 133*  K 3.6  3.6 3.8 4.1 3.5 3.4* 3.8  CL 96* 98 101 99 97* 95* 98  CO2 '26 28 25 25 28 24 22  '$ GLUCOSE 122* 121* 132* 134* 137* 118* 102*  BUN 34* 18 26* 39* 25* 34* 42*  CREATININE 4.01* 2.90* 3.77* 4.83* 3.67* 4.35* 4.93*  CALCIUM 8.2* 8.0* 8.3* 8.3* 8.1* 7.9* 8.0*  PHOS 2.9 2.9 4.0 4.4 3.8 4.1 5.2*   CBC Recent Labs  Lab 01/08/21 0720  01/09/21 0326 01/10/21 0456 01/11/21 0059  WBC 8.7 9.9 8.1 11.1*  HGB 7.9* 7.2* 7.1* 7.6*  HCT 25.8* 23.4* 22.9* 24.3*  MCV 87.8 87.3 86.4 86.5  PLT 279 235 277 349    Medications:     acetaminophen  1,000 mg Oral TID   amiodarone  200 mg Oral Daily   apixaban  5 mg Oral BID   Chlorhexidine Gluconate Cloth  6 each Topical Daily   Chlorhexidine Gluconate Cloth  6 each Topical Q0600   cilostazol  50 mg Oral BID   diclofenac Sodium  2 g Topical QID   ezetimibe  10 mg Oral Daily   famotidine  20 mg Oral Daily   feeding supplement (NEPRO CARB STEADY)  237 mL Oral BID BM   FLUoxetine  10 mg Oral Daily   hydrALAZINE  25 mg Oral Q8H   insulin aspart  0-6 Units Subcutaneous TID WC   isosorbide mononitrate  30 mg Oral Daily   lidocaine  1 patch Transdermal Q24H   metoprolol succinate  50 mg Oral Daily   multivitamin  1 tablet Oral QHS   pantoprazole  40 mg Oral QHS   polyethylene glycol  17 g Oral Daily   rosuvastatin  10 mg Oral Daily   sodium chloride flush  3 mL Intravenous Q12H      Otelia Santee, MD 01/11/2021, 12:02 PM

## 2021-01-12 ENCOUNTER — Encounter: Payer: Self-pay | Admitting: Internal Medicine

## 2021-01-12 ENCOUNTER — Other Ambulatory Visit (HOSPITAL_COMMUNITY): Payer: Self-pay

## 2021-01-12 DIAGNOSIS — R319 Hematuria, unspecified: Secondary | ICD-10-CM

## 2021-01-12 DIAGNOSIS — R3121 Asymptomatic microscopic hematuria: Secondary | ICD-10-CM

## 2021-01-12 LAB — BLOOD GAS, ARTERIAL
Acid-Base Excess: 2.2 mmol/L — ABNORMAL HIGH (ref 0.0–2.0)
Bicarbonate: 26.4 mmol/L (ref 20.0–28.0)
Drawn by: 441371
FIO2: 28
O2 Saturation: 98.8 %
Patient temperature: 37
pCO2 arterial: 42.8 mmHg (ref 32.0–48.0)
pH, Arterial: 7.407 (ref 7.350–7.450)
pO2, Arterial: 106 mmHg (ref 83.0–108.0)

## 2021-01-12 LAB — PREPARE RBC (CROSSMATCH)

## 2021-01-12 LAB — RENAL FUNCTION PANEL
Albumin: 2 g/dL — ABNORMAL LOW (ref 3.5–5.0)
Anion gap: 8 (ref 5–15)
BUN: 18 mg/dL (ref 8–23)
CO2: 26 mmol/L (ref 22–32)
Calcium: 7.6 mg/dL — ABNORMAL LOW (ref 8.9–10.3)
Chloride: 100 mmol/L (ref 98–111)
Creatinine, Ser: 2.67 mg/dL — ABNORMAL HIGH (ref 0.44–1.00)
GFR, Estimated: 19 mL/min — ABNORMAL LOW (ref >60)
Glucose, Bld: 149 mg/dL — ABNORMAL HIGH (ref 70–99)
Phosphorus: 2.8 mg/dL (ref 2.5–4.6)
Potassium: 3.4 mmol/L — ABNORMAL LOW (ref 3.5–5.1)
Sodium: 134 mmol/L — ABNORMAL LOW (ref 135–145)

## 2021-01-12 LAB — HEMOGLOBIN AND HEMATOCRIT, BLOOD
HCT: 22.7 % — ABNORMAL LOW (ref 36.0–46.0)
Hemoglobin: 7.2 g/dL — ABNORMAL LOW (ref 12.0–15.0)

## 2021-01-12 LAB — CBC
HCT: 18.7 % — ABNORMAL LOW (ref 36.0–46.0)
Hemoglobin: 6 g/dL — CL (ref 12.0–15.0)
MCH: 26.9 pg (ref 26.0–34.0)
MCHC: 32.1 g/dL (ref 30.0–36.0)
MCV: 83.9 fL (ref 80.0–100.0)
Platelets: 265 10*3/uL (ref 150–400)
RBC: 2.23 MIL/uL — ABNORMAL LOW (ref 3.87–5.11)
RDW: 16.9 % — ABNORMAL HIGH (ref 11.5–15.5)
WBC: 11.8 10*3/uL — ABNORMAL HIGH (ref 4.0–10.5)
nRBC: 0 % (ref 0.0–0.2)

## 2021-01-12 LAB — GLUCOSE, CAPILLARY
Glucose-Capillary: 139 mg/dL — ABNORMAL HIGH (ref 70–99)
Glucose-Capillary: 155 mg/dL — ABNORMAL HIGH (ref 70–99)
Glucose-Capillary: 146 mg/dL — ABNORMAL HIGH (ref 70–99)
Glucose-Capillary: 150 mg/dL — ABNORMAL HIGH (ref 70–99)

## 2021-01-12 MED ORDER — DIPHENHYDRAMINE HCL 25 MG PO CAPS
25.0000 mg | ORAL_CAPSULE | Freq: Once | ORAL | Status: DC
  Filled 2021-01-12: qty 1

## 2021-01-12 MED ORDER — SODIUM CHLORIDE 0.9% IV SOLUTION: Freq: Once | INTRAVENOUS | Status: DC

## 2021-01-12 MED ORDER — SODIUM CHLORIDE 0.9% IV SOLUTION
Freq: Once | INTRAVENOUS | Status: DC
Start: 2021-01-12 — End: 2021-01-17

## 2021-01-12 NOTE — Progress Notes (Signed)
PROGRESS NOTE  Molly [name]    DOB: [birth year], 70 y.o.  [id number]  PCP: [name], MD   Code Status: Full Code   DOA: 12/27/2020   LOS: 10  Brief Narrative of Current Hospitalization  Molly [name] is a 70 y.o. female with a PMH significant for HTN, left renal artery stenosis, CHF, CKD stage IV now end-stage, CAD, PAD with bilateral SFA CTO's, anemia of chronic disease, diabetes. They presented from home to the ED on 12/27/2020 with chest pain x 4 days. In the ED, it was found that they had fluid overload and A. fib with RVR. They were treated with BiPAP and medical management per cardiology.  Additionally, during hospitalization, patient had fall with left hip fracture and worsening kidney function transitioned her to start hemodialysis this admission. Patient was admitted to medicine service for further workup and management of complex care of respiratory failure, end-stage kidney, hip fracture as outlined in detail below.  Palliative care, vascular surgery, nephrology, cardiology, Ortho also consulted this admission.  01/12/21 -worsening clinically  Assessment & Plan  Principal Problem:   Acute on chronic respiratory failure with hypoxia and hypercapnia (HCC) Active Problems:   HTN (hypertension)   Goals of care, counseling/discussion   Type 2 diabetes mellitus with stage 3b chronic kidney disease, with long-term current use of insulin (HCC)   Elevated troponin   Acute on chronic heart failure with preserved ejection fraction (HFpEF) ([place])   CAP (community acquired pneumonia)   Anemia   Abnormal CT of the abdomen   Acute postoperative pain of left hip   Fall   Anemia due to end stage renal disease ([place])  Acute on chronic respiratory failure with hypoxia and hypercapnia-previously resolved. ORA overnight and is now on 2L [place].  Normal work of breathing.  More sedated than she was yesterday -ABG ordered  Acute symptomatic anemia- hgb from 7.6-->6.0 today. Transfusion  ordered by overnight provider.  Currently receiving -Posttransfusion CBC  Hematuria 2/2 obstructing ureteral stone on left. Asymptomatic. Left ureteral stent placed 9/15. - f/u urology  A. fib with RVR-rate controlled -Continue amiodarone load x7 days total, followed by 200 mg for 3 weeks -Cardiology following -Continue Eliquis  ESRD- now on HD T,Th,S schedule. -Vascular following for right IJ insertion 9/26 and monitoring fistula.  Per their note from today there is concern for occlusion of anastomosis so surgeon will evaluate. - Patient will need to follow-up outpatient in 4 to 6 weeks for fistula duplex (around October 31) - RFP am - CLIP completed  Left hip fracture-after fall while inpatient. patient recovering well from fixation on 9/22. -PT/OT and weightbearing as tolerated -Follow-up with Ortho postop 2 weeks for suture removal and x-rays.  (Around October 7) - [facility] consulted for SNF placement  HTN- moderately well controlled with low diastolics.  - amiodorone, imdur, metoprolol  T2DM- well controlled with diet - discontinue sSSI  DVT prophylaxis:  apixaban (ELIQUIS) tablet 5 mg   Diet:  Diet Orders (From admission, onward)     Start     Ordered   01/10/21 1444  Diet renal/carb modified with fluid restriction Fluid restriction: 1200 mL Fluid; Room service appropriate? Yes; Fluid consistency: Thin  Diet effective now       Question Answer Comment  Fluid restriction: 1200 mL Fluid   Room service appropriate? Yes   Fluid consistency: Thin      01/10/21 1444            Subjective 01/12/21  Patient is asleep and in no acute distress. Opens eyes spontaneously to walking into room. She states that she is not doing well at all. Maintains alertness during conversation but has eye closed and is slow to answer. Not able to describe specific concerns or pain. Asks for her husband.  Disposition Plan & Communication  Status is: Inpatient  Remains inpatient  appropriate because:Ongoing diagnostic testing needed not appropriate for outpatient work up  Dispo: The patient is from: Home              Anticipated d/c is to: Home              Patient currently is not medically stable to d/c.   Difficult to place patient No  Family Communication: husband at bedside   Consults, Procedures, Significant Events  Consultants:  Nephrology Ortho  Urology   Procedures/significant events:  HD Urethral stent Internal hip fixation  Antimicrobials:  Anti-infectives (From admission, onward)    Start     Dose/Rate Route Frequency Ordered Stop   01/10/21 0904  ceFAZolin (ANCEF) 2-4 GM/100ML-% IVPB       Note to Pharmacy: Odelia Gage   : cabinet override      01/10/21 0904 01/10/21 2114   01/07/21 0230  ceFAZolin (ANCEF) IVPB 2g/100 mL premix        2 g 200 mL/hr over 30 Minutes Intravenous Every 12 hours 01/06/21 1711 01/07/21 0252   01/06/21 2100  ceFAZolin (ANCEF) IVPB 2g/100 mL premix  Status:  Discontinued        2 g 200 mL/hr over 30 Minutes Intravenous Every 6 hours 01/06/21 1649 01/06/21 1711   01/06/21 1400  ceFAZolin (ANCEF) IVPB 2g/100 mL premix        2 g 200 mL/hr over 30 Minutes Intravenous On call to O.R. 01/05/21 1926 01/06/21 1441   12/31/20 1330  cefTRIAXone (ROCEPHIN) 1 g in sodium chloride 0.9 % 100 mL IVPB        1 g 200 mL/hr over 30 Minutes Intravenous  Once 12/31/20 1315 12/31/20 1350   12/31/20 1000  cefTRIAXone (ROCEPHIN) 1 g in sodium chloride 0.9 % 100 mL IVPB  Status:  Discontinued        1 g 200 mL/hr over 30 Minutes Intravenous Every 24 hours 12/30/20 1202 12/31/20 1315   12/27/20 1530  cefTRIAXone (ROCEPHIN) 2 g in sodium chloride 0.9 % 100 mL IVPB  Status:  Discontinued        2 g 200 mL/hr over 30 Minutes Intravenous Every 24 hours 12/27/20 1435 12/30/20 1202        Objective   Vitals:   01/11/21 2228 01/12/21 0658 01/12/21 0736 01/12/21 0802  BP: (!) 108/41 (!) 102/44 (!) 110/46 (!) 135/47  Pulse: 69  75 74 77  Resp: '15 16 16 16  '$ Temp:  98.6 F (37 C) 98.1 F (36.7 C) 98.1 F (36.7 C)  TempSrc:  Oral Oral   SpO2:   100%   Weight:      Height:        Intake/Output Summary (Last 24 hours) at 01/12/2021 0836 Last data filed at 01/11/2021 2215 Gross per 24 hour  Intake 370 ml  Output 1802 ml  Net -1432 ml    Filed Weights   01/10/21 0853 01/11/21 0850 01/11/21 1302  Weight: 66.8 kg 64.4 kg 62.6 kg    Patient BMI: Body mass index is 25.24 kg/m.   Physical Exam: General: alert, NAD HEENT: atraumatic, clear conjunctiva, anicteric  sclera, moist mucus membranes, hearing grossly normal Respiratory:  normal respiratory effort. [place] in place. Speaking in short sentences Cardiovascular: normal S1/S2,  RRR, no JVD, murmurs, rubs, gallops, quick capillary refill  Nervous: Alert. no gross focal neurologic deficits, normal speech Extremities: moves all equally, no edema, normal tone Skin: dry, intact, normal temperature, normal color, No rashes, lesions or ulcers Psychiatry: depressed mood  Labs   I have personally reviewed following labs and imaging studies CBC    Component Value Date/Time   WBC 11.8 (H) 01/12/2021 0340   RBC 2.23 (L) 01/12/2021 0340   HGB 6.0 (LL) 01/12/2021 0340   HGB 12.5 08/19/2019 1111   HCT 18.7 (L) 01/12/2021 0340   HCT 39.4 08/19/2019 1111   PLT 265 01/12/2021 0340   PLT 299 08/19/2019 1111   MCV 83.9 01/12/2021 0340   MCV 86 08/19/2019 1111   MCH 26.9 01/12/2021 0340   MCHC 32.1 01/12/2021 0340   RDW 16.9 (H) 01/12/2021 0340   RDW 12.7 08/19/2019 1111   LYMPHSABS 1.3 11/26/2020 1226   MONOABS 0.5 11/26/2020 1226   EOSABS 0.1 11/26/2020 1226   BASOSABS 0.0 11/26/2020 1226   Renal function panel  Imaging Studies  No results found. Medications   Scheduled Meds:  (feeding supplement) PROSource Plus  30 mL Oral TID BM   sodium chloride   Intravenous Once   acetaminophen  1,000 mg Oral TID   amiodarone  200 mg Oral Daily   apixaban  5 mg Oral  BID   Chlorhexidine Gluconate Cloth  6 each Topical Q0600   cilostazol  50 mg Oral BID   diclofenac Sodium  2 g Topical QID   ezetimibe  10 mg Oral Daily   famotidine  20 mg Oral Daily   feeding supplement (NEPRO CARB STEADY)  237 mL Oral BID BM   FLUoxetine  10 mg Oral Daily   hydrALAZINE  25 mg Oral Q8H   insulin aspart  0-6 Units Subcutaneous TID WC   isosorbide mononitrate  30 mg Oral Daily   lidocaine  1 patch Transdermal Q24H   metoprolol succinate  50 mg Oral Daily   multivitamin  1 tablet Oral QHS   pantoprazole  40 mg Oral QHS   polyethylene glycol  17 g Oral Daily   rosuvastatin  10 mg Oral Daily   Continuous Infusions:  sodium chloride 10 mL/hr at 01/06/21 1409     LOS: 16 days   Time spent: >100mn   Kienna Moncada L Diamone Whistler, DO [facility] 01/12/2021, 8:36 AM   To contact the TC[facility] LL[facility]ttending or Consulting provider for this patient: Check the care team in CBay Microsurgical Unitfor a) attending/consulting TRoscoeprovider listed and b) the [facility], Incteam listed Log into [email or web address] and use [place]'s universal password to access. If you do not have the password, please contact the hospital operator. Locate the TSurgery Center Of Melbourneprovider you are looking for under [facility] and page to a number that you can be directly reached. If you still have difficulty reaching the provider, please page the [facility](Director on Call) for the Hospitalists listed on amion for assistance.

## 2021-01-12 NOTE — Evaluation (Signed)
Occupational Therapy Evaluation Patient Details Name: Molly [name] MRN: [id number] DOB: 04-02-1951 Today's Date: 01/12/2021   History of Present Illness This is a 70 y.o. female admitted 9/12 with SOB and chest pain.  HTN crisis and afib with RVR.  Pt had fall while in hospital resulting in left hip fx 9/14. Was placed on bedrest. Pt found to have obstructive uropathy and had a ureteral stent placed9/15.  Hemodialysis initiated 9/17 due to uremia with pt with right femoral temporary HD cath in place.  Pt had fall while in hospital resulting in left hip fx and underwent IM nail left hip 9/22.   9/26 s/p R IJ venin tunneled dialysis cath, L brachiocephalic arteriovenous fistula creation and left cephalic vein thrombectomy. PMH: HTN, CHF, diabetes mellitus type 2,  afib, and CKD   Clinical Impression   PTA patient independent with ADLs, mobility.  Admitted for above and presenting with problem list below, including impaired cognition, weakness, decreased activity tolerance and pain.  Limited evaluation today due to lethargy, but pt requires total assist +2 for bed mobility and total assist for all self care at this time.  She inconsistently follows commands, has poor awareness of deficits and decreased problem solving. She was able to verbalize correct location when given choices. Requires cueing to open eyes during session and doesn't maintain. Patient voicing "night night" and calls out for her mom during session.  She will benefit from further OT services while admitted and after dc at SNF level to optimize independence with ADLs and mobility.      Recommendations for follow up therapy are one component of a multi-disciplinary discharge planning process, led by the attending physician.  Recommendations may be updated based on patient status, additional functional criteria and insurance authorization.   Follow Up Recommendations  SNF;Supervision/Assistance - 24 hour    Equipment Recommendations   Other (comment) (TBD at next venue of care)    Recommendations for Other Services       Precautions / Restrictions Precautions Precautions: Fall Restrictions Weight Bearing Restrictions: Yes LLE Weight Bearing: Weight bearing as tolerated      Mobility Bed Mobility Overal bed mobility: Needs Assistance Bed Mobility: Rolling Rolling: Total assist;+2 for physical assistance         General bed mobility comments: rolling in bed with total assist +2, to adjust pads with limited movement allowed as pt resistive. Requires +2 total assist to boost in bed.    Transfers                      Balance                                           ADL either performed or assessed with clinical judgement   ADL Overall ADL's : Needs assistance/impaired     Grooming: Wash/dry face;Total assistance;Bed level Grooming Details (indicate cue type and reason): total hand over hand assist to wash face                             Functional mobility during ADLs: +2 for physical assistance;+2 for safety/equipment;Total assistance (to reposition in bed) General ADL Comments: total assist for all self care at this time     Vision   Additional Comments: cueing to open eyes during session, unable to maintain eyes  open     Perception     Praxis      Pertinent Vitals/Pain Pain Assessment: Faces Faces Pain Scale: Hurts even more Pain Location: left hip Pain Descriptors / Indicators: Discomfort;Grimacing;Guarding Pain Intervention(s): Limited activity within patient's tolerance;Monitored during session;Repositioned     Hand Dominance Right   Extremity/Trunk Assessment Upper Extremity Assessment Upper Extremity Assessment: Generalized weakness;Difficult to assess due to impaired cognition   Lower Extremity Assessment Lower Extremity Assessment: Defer to PT evaluation       Communication Communication Communication: Receptive  difficulties;Expressive difficulties   Cognition Arousal/Alertness: Lethargic Behavior During Therapy: Flat affect Overall Cognitive Status: Difficult to assess Area of Impairment: Orientation;Following commands;Problem solving                 Orientation Level: Disoriented to;Time;Situation     Following Commands: Follows one step commands inconsistently;Follows one step commands with increased time     Problem Solving: Slow processing;Decreased initiation;Requires verbal cues General Comments: pt lethargic and not following simple commands consistently and requires greatly increased time.  Voices "night night' during session and calls out for her mom (spouse says she does this when she is distressed). pt able to correctly identify location given choices   General Comments  VSS, spouse present and supportive    Exercises     Shoulder Instructions      Home Living Family/patient expects to be discharged to:: Private residence Living Arrangements: Spouse/significant other Available Help at Discharge: Family;Available 24 hours/day Type of Home: House Home Access: Stairs to enter [employer] of Steps: 2 Entrance Stairs-Rails: Right;Left;Can reach both Home Layout: One level     Bathroom Shower/Tub: Teacher, early years/pre: Standard     Home Equipment: Environmental consultant - 2 wheels;Cane - single point;Walker - 4 wheels;Bedside commode;Shower seat;Grab bars - tub/shower;Grab bars - toilet;Hand held shower head;Wheelchair - manual   Additional Comments: buliding ramp for house      Prior Functioning/Environment Level of Independence: Needs assistance  Gait / Transfers Assistance Needed: reports ambulatory without AD though will occasionally  hold to husband's arm out in community ADL's / Homemaking Assistance Needed: independent ADLs, spouse assists with IADLs            OT Problem List: Decreased strength;Decreased range of motion;Impaired balance  (sitting and/or standing);Decreased activity tolerance;Decreased coordination;Impaired vision/perception;Decreased cognition;Decreased safety awareness;Decreased knowledge of use of DME or AE;Pain;Decreased knowledge of precautions      OT Treatment/Interventions: Self-care/ADL training;Therapeutic exercise;DME and/or AE instruction;Therapeutic activities;Cognitive remediation/compensation;Patient/family education;Balance training    OT Goals(Current goals can be found in the care plan section) Acute Rehab OT Goals Patient Stated Goal: to get her moving and to feel better OT Goal Formulation: With family Time For Goal Achievement: 01/27/21 Potential to Achieve Goals: Fair  OT Frequency: Min 2X/week   Barriers to D/C:            Co-evaluation              AM-PAC OT "6 Clicks" Daily Activity     Outcome Measure Help from another person eating meals?: Total Help from another person taking care of personal grooming?: Total Help from another person toileting, which includes using toliet, bedpan, or urinal?: Total Help from another person bathing (including washing, rinsing, drying)?: Total Help from another person to put on and taking off regular upper body clothing?: Total Help from another person to put on and taking off regular lower body clothing?: Total 6 Click Score: 6   End of Session  Equipment Utilized During Treatment: Oxygen (2L) Nurse Communication: Mobility status  Activity Tolerance: Patient limited by pain;Patient limited by lethargy Patient left: in bed;with call bell/phone within reach;with bed alarm set;with family/visitor present  OT Visit Diagnosis: Other abnormalities of gait and mobility (R26.89);Muscle weakness (generalized) (M62.81);Pain;Other symptoms and signs involving cognitive function;History of falling (Z91.81) Pain - Right/Left: Left Pain - part of body: Leg                Time: [id number] OT Time Calculation (min): 26 min Charges:  OT General  Charges $OT Visit: 1 Visit OT Evaluation $OT Eval Moderate Complexity: 1 Mod OT Treatments $Self Care/Home Management : 8-22 mins  Jolaine Artist, OT Acute Rehabilitation Services Pager [phone] Office [phone]   Delight Stare 01/12/2021, 2:19 PM

## 2021-01-12 NOTE — Progress Notes (Signed)
Pt's spouse called RN for an update after the patient called him from personal phone. Pt alert and oriented only to person and place. RN offered food to the pt but pt has no appetite and refused to eat. Will continue to monitor pt.

## 2021-01-12 NOTE — TOC Benefit Eligibility Note (Signed)
Patient Advocate Encounter  Insurance verification completed.    The patient is currently admitted and upon discharge could be taking Eliquis 5 mg.  The current 30 day co-pay is, $47.00.   The patient is insured through [employer] Medicare Part D    Fransheska Willingham, CPhT Pharmacy Patient Advocate Specialist [place] [facility] Team Direct Number: [phone]  Fax: [phone]        

## 2021-01-12 NOTE — TOC Progression Note (Signed)
Transition of Care [facility]) - Initial/Assessment Note    Patient Details  Name: Molly [name] MRN: [id number] Date of Birth: 20-Dec-1950  Transition of Care [facility]) CM/SW Contact:    Milinda Antis, [place] Phone Number: 01/12/2021, 3:15 PM  Clinical Narrative:                  CSW following patient for any d/c planning needs once medically stable.  CSW completed chart review.  Current PT recommendation is CIR vs 24 hour assistance.  On 9/23 rehab admissions noted that the patient would have to be reevaluated for CIR after next PT session.    Pending: CIR admissions decision.   Lind Covert, MSW, LCSWA        Patient Goals and CMS Choice        Expected Discharge Plan and Services                                                Prior Living Arrangements/Services                       Activities of Daily Living      Permission Sought/Granted                  Emotional Assessment              Admission diagnosis:  Acute respiratory failure with hypoxia and hypercapnia (HCC) [J96.01, J96.02] Acute on chronic respiratory failure with hypoxia and hypercapnia (HCC) [id number], J96.22] Patient Active Problem List   Diagnosis Date Noted   Hematuria    Fall    Anemia due to end stage renal disease ([place])    Acute postoperative pain of left hip    Abnormal CT of the abdomen    CAP (community acquired pneumonia) 12/27/2020   Anemia 12/27/2020   Dyspnea on exertion 11/26/2020   Acute on chronic respiratory failure with hypoxia ([place]) 11/26/2020   Acute on chronic respiratory failure with hypoxia and hypercapnia ([place]) 11/26/2020   Stage 4 chronic kidney disease ([place]) 11/26/2020   Acute on chronic heart failure with preserved ejection fraction (HFpEF) ([place]) 11/26/2020   Elevated d-dimer 11/26/2020   Prolonged QT interval 11/26/2020   Congestive heart failure (CHF) ([name]) 10/07/2020   Elevated troponin    Chest pain 08/31/2020   Hypertensive  emergency 07/12/2020   Type 2 diabetes mellitus with stage 3b chronic kidney disease, with long-term current use of insulin ([place]) 07/12/2020   Hypertension    Diabetes mellitus without complication (HCC)    Atherosclerosis of coronary artery without angina pectoris 04/21/2020   Atopic dermatitis 04/21/2020   Goals of care, counseling/discussion 04/21/2020   Diarrhea 04/21/2020   Gastroesophageal reflux disease 04/21/2020   Hyperglycemia due to type 2 diabetes mellitus ([place]) 04/21/2020   Hypertensive heart disease without congestive heart failure 04/21/2020   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   Left-sided chest pain 04/21/2020   Long term (current) use of insulin ([place]) 04/21/2020   Loss of appetite 04/21/2020   Nausea 04/21/2020   Occlusion and stenosis of bilateral carotid arteries 04/21/2020   Proptosis 04/21/2020   Benign hypertension with CKD (chronic kidney disease) stage III ([place]) 04/21/2020   Epigastric pain 04/21/2020   Standard chest x-ray abnormal 04/21/2020   Vitamin D deficiency 04/21/2020  Weight loss 04/21/2020   Renal artery stenosis (HCC) 12/17/2019   Claudication in peripheral vascular disease ([place]) 08/28/2019   Bilateral impacted cerumen 08/21/2019   Irritable bowel syndrome with diarrhea 10/02/2018   Osteopenia 10/02/2018   Other dysphagia 09/10/2018   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   Proximal leg weakness 09/10/2018   Ptosis of left eyelid 09/10/2018   Dyslipidemia 08/30/2018   Type 2 diabetes mellitus with stage 4 chronic kidney disease, with long-term current use of insulin ([place]) 07/15/2018   Peripheral vascular disease ([place]) 07/15/2018   DKA (diabetic ketoacidoses) 06/29/2015   HTN (hypertension) 06/29/2015   Acute kidney injury ([place]) 06/29/2015   Cholelithiasis 06/29/2015   Abdominal aortic atherosclerosis with stenosis 06/29/2015   Dehydration with hyponatremia  06/29/2015   Leukocytosis 06/29/2015   Cholecystitis 06/29/2015   PCP:  [name], MD Pharmacy[facility]   [id number]: [phone]: [phone]    Social Determinants of Health (SDOH) Interventions Food Insecurity Interventions: Intervention Not Indicated Financial Strain Interventions: Intervention Not Indicated Housing Interventions: Intervention Not Indicated Transportation Interventions: Intervention Not Indicated  Readmission Risk Interventions No flowsheet data found.

## 2021-01-12 NOTE — Progress Notes (Signed)
Orders received to transfuse 2 units of blood. Secure chat sent to MD Augustin Coupe to discuss. Per MD Augustin Coupe blood to be transfused for HD tomorrow per orders. Update patient and family member at bedside.

## 2021-01-12 NOTE — Progress Notes (Signed)
Critical lab hgb 6.0, provider made aware, orders to transfuse blood

## 2021-01-12 NOTE — Progress Notes (Signed)
   Daily Progress Note   Patient Name: Molly [name]       Date: 01/12/2021 DOB: Feb 15, 1951  Age: 70 y.o. MRN#: [id number] Attending Physician: Richarda Osmond, MD Primary Care Physician: [name], MD Admit Date: 12/27/2020  Reason for Consultation/Follow-up: Establishing goals of care  Subjective: Chart Reviewed. Updates Received. Patient Assessed.   Patient somnolent but easily awakens. Answers questions appropriately. Denies pain. States she is "exhausted  today". Husband is at the bedside. Patient has received 1 unit packed red cells. Appetite remains minimal.   Reviewed previously goals of care. Patient awake and stating "whatever he says, main thing is to do everything!" Husband verbalizes his concerns with patient's condition in addition to events since hospitalized. Expressed goals remains clear to continue to treat aggressively. Husband is remaining hopeful for improvement/stability.   Patient's pain is reported to be a control. Husband expresses Tylenol seems to have made a difference. She has only received 1 dose of Oxy IR '5mg'$  over the past 24 hours and no required Dilaudid. Husband aware of no required changes.   All questions answered and support provided.    Length of Stay: 16 days  Vital Signs: BP (!) 153/58   Pulse 77   Temp (!) 97.4 F (36.3 C)   Resp 16   Ht '5\' 2"'$  (1.575 m)   Wt 62.6 kg   SpO2 100%   BMI 25.24 kg/m  SpO2: SpO2: 100 % O2 Device: O2 Device: Nasal Cannula O2 Flow Rate: O2 Flow Rate (L/min): 2 L/min  Physical Exam: Somnolent, NAD,  ill-appearing RRR Diminished bilaterally AAO x3, mood appropriate            Palliative Care Assessment & Plan   Code Status: Full code  Goals of Care/Recommendations: Continue to treat aggressively Patient's pain improved. No changes at this time. Patient has received 1 Oxy IR dose over past 24 hrs and no required IV Dilaudid. Continue scheduled Tylenol Patient and husband is clear they wish  full scope/full code level of care. Husband is very apprehensive regarding care and outcomes given events since hospitalization.  PMT will continue to support and follow.  Please reach out if any questions arise for now Palliative will follow on as needed basis, goals clear and set.    Prognosis: Guarded   Discharge Planning: To Be Determined  Thank you for allowing the Palliative Medicine Team to assist in the care of this patient.  Time Total: 45 min.   Visit consisted of counseling and education dealing with the complex and emotionally intense issues of symptom management and palliative care in the setting of serious and potentially life-threatening illness.Greater than 50%  of this time was spent counseling and coordinating care related to the above assessment and plan.  Alda Lea, AGPCNP-BC  Palliative Medicine Team [phone]

## 2021-01-12 NOTE — Progress Notes (Addendum)
[facility] Progress Note   70 year old lady with history of hypertension left renal artery stenosis [id number] diastolic dysfunction chronic kidney disease stage IV peripheral artery disease bilateral  superficial femoral artery obstruction.  Anemia, diabetes.  Presented 12/27/2020 with chest pain.  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 Now s/p left ureteral stent placement but with no improvement. -HD started for uremia & failed lasix challenge. Rt fem temp line placed with IR on 9/18.  Hemodialysis started 01/01/2021.  HD on 01/09/2023, 9/27 with 1.8L UF  Has a spot at [facility] on TTS schedule to arrive at 10:00 for 10:25 chair time. On pt's first appt, pt will need to arrive at 9:25 in order to complete paperwork prior to first treatment. This will be after she's completed rehab as she likely will be CIR.  Very weak bruit in left upper arm fistula which was thrombectomized intraop +  RIJ TC on 01/10/2021.    Next HD Thur  2.Hematuria Left obstructive uropathy-  -in the setting of mildly obstructing 6 mm distal left ureter renal stone seen on CT urogram. S/p left ureteral stent on 9/15. ANCA and lupus serologies wnl. Appreciate urology's assistance   3.Intertrochanteric hip fracture- s/p fall in the hospital.  -ortho on board, preop clearance for repair     4.Epistaxis- overnight 9/14, since resolved. Thought to be due to nasal canula.  Anca serologies neg   5.Left renal artery stenosis- Hx of 80-90% proximal left renal artery stenosis.   6.Hypertension- BP well controlled on norvasc, metoprolol, and hydralazine.  Ultrafiltration with dialysis blood pressure control improved   7.Hyponatremia-resolved   8.Afib w/ RVR-on amio. Hep gtt on hold given epistaxis, which has resolved.    9.Anemia due to chronic kidney disease-  Hgb decr and will need transfusion with 2 units.   10.Diabetes Mellitus Type 2 with Hyperglycemia- -per primary  Subjective:   C/o pain especially right ankle but denies dyspnea, fever, chills   Objective:   BP (!) 153/58   Pulse 77   Temp (!) 97.4 F (36.3 C)   Resp 16   Ht '5\' 2"'$  (1.575 m)   Wt 62.6 kg   SpO2 100%   BMI 25.24 kg/m   Intake/Output Summary (Last 24 hours) at 01/12/2021 1206 Last data filed at 01/12/2021 1049 Gross per 24 hour  Intake 673.33 ml  Output 1802 ml  Net -1128.67 ml   Weight change: -2.4 kg  Physical Exam: Gen: Calm with spouse bedside, laying flat in bed, confused but very pleasant CVS:RRR, no murmurs Resp: trace bibasilar, normal WOB [id number], nontender Ext:no edema, RIJ TC Neuro: pleasant this AM and  moving all ext spontaneously  Imaging: DG CHEST PORT 1 VIEW  Result Date: 01/10/2021 CLINICAL DATA:  Tunnel dialysis EXAM: PORTABLE CHEST 1 VIEW COMPARISON:  03/28/2021 FINDINGS: Large-bore dialysis catheter with tip in the RIGHT atrium. Low lung volumes. Mild central venous congestion which is improved from comparison exam. Mild LEFT basilar atelectasis. No pneumothorax IMPRESSION:.  NO PNEUMOTHORAX: IMPRESSION:.  NO PNEUMOTHORAX 1. Interval placement dialysis catheter without complication. 2. Improvement venous congestion. Electronically Signed   By: Suzy Bouchard M.D.   On: 01/10/2021 14:31    Labs: BMET Recent Labs  Lab 01/06/21 0335 01/07/21 0229 01/08/21 0720 01/09/21 0326 01/10/21 0456 01/11/21 0059 01/12/21 0340  NA 134* 136 135 134*  131* 133* 134*  K 3.6 3.8 4.1 3.5 3.4* 3.8 3.4*  CL 98 101 99 97* 95* 98 100  CO2 '28 25 25 28 24 22 26  '$ GLUCOSE 121* 132* 134* 137* 118* 102* 149*  BUN 18 26* 39* 25* 34* 42* 18  CREATININE 2.90* 3.77* 4.83* 3.67* 4.35* 4.93* 2.67*  CALCIUM 8.0* 8.3* 8.3* 8.1* 7.9* 8.0* 7.6*  PHOS 2.9 4.0 4.4 3.8 4.1 5.2* 2.8   CBC Recent Labs  Lab  01/09/21 0326 01/10/21 0456 01/11/21 0059 01/12/21 0340  WBC 9.9 8.1 11.1* 11.8*  HGB 7.2* 7.1* 7.6* 6.0*  HCT 23.4* 22.9* 24.3* 18.7*  MCV 87.3 86.4 86.5 83.9  PLT 235 277 349 265    Medications:     (feeding supplement) PROSource Plus  30 mL Oral TID BM   sodium chloride   Intravenous Once   acetaminophen  1,000 mg Oral TID   amiodarone  200 mg Oral Daily   apixaban  5 mg Oral BID   Chlorhexidine Gluconate Cloth  6 each Topical Q0600   cilostazol  50 mg Oral BID   diclofenac Sodium  2 g Topical QID   ezetimibe  10 mg Oral Daily   famotidine  20 mg Oral Daily   feeding supplement (NEPRO CARB STEADY)  237 mL Oral BID BM   FLUoxetine  10 mg Oral Daily   isosorbide mononitrate  30 mg Oral Daily   lidocaine  1 patch Transdermal Q24H   metoprolol succinate  50 mg Oral Daily   multivitamin  1 tablet Oral QHS   pantoprazole  40 mg Oral QHS   polyethylene glycol  17 g Oral Daily   rosuvastatin  10 mg Oral Daily      Otelia Santee, MD 01/12/2021, 12:06 PM

## 2021-01-12 NOTE — Progress Notes (Signed)
  Progress Note    01/12/2021 7:30 AM 2 Days Post-Op  Subjective:  somnolent and confused   Vitals:   01/11/21 2228 01/12/21 0658  BP: (!) 108/41 (!) 102/44  Pulse: 69 75  Resp: 15 16  Temp:  98.6 F (37 C)  SpO2:     Physical Exam: Lungs:  non labore Incisions:  L arm incision c/d/i Extremities:  palpable L radial pulse; pulsatile anastomosis of fistula but no palpable flow beyond this Neurologic: somnolent  CBC    Component Value Date/Time   WBC 11.8 (H) 01/12/2021 0340   RBC 2.23 (L) 01/12/2021 0340   HGB 6.0 (LL) 01/12/2021 0340   HGB 12.5 08/19/2019 1111   HCT 18.7 (L) 01/12/2021 0340   HCT 39.4 08/19/2019 1111   PLT 265 01/12/2021 0340   PLT 299 08/19/2019 1111   MCV 83.9 01/12/2021 0340   MCV 86 08/19/2019 1111   MCH 26.9 01/12/2021 0340   MCHC 32.1 01/12/2021 0340   RDW 16.9 (H) 01/12/2021 0340   RDW 12.7 08/19/2019 1111   LYMPHSABS 1.3 11/26/2020 1226   MONOABS 0.5 11/26/2020 1226   EOSABS 0.1 11/26/2020 1226   BASOSABS 0.0 11/26/2020 1226    BMET    Component Value Date/Time   NA 134 (L) 01/12/2021 0340   NA 141 09/03/2019 1600   K 3.4 (L) 01/12/2021 0340   CL 100 01/12/2021 0340   CO2 26 01/12/2021 0340   GLUCOSE 149 (H) 01/12/2021 0340   BUN 18 01/12/2021 0340   BUN 24 09/03/2019 1600   CREATININE 2.67 (H) 01/12/2021 0340   CALCIUM 7.6 (L) 01/12/2021 0340   GFRNONAA 19 (L) 01/12/2021 0340   GFRAA 59 (L) 09/03/2019 1600    INR No results found for: INR   Intake/Output Summary (Last 24 hours) at 01/12/2021 0730 Last data filed at 01/11/2021 2215 Gross per 24 hour  Intake 470 ml  Output 1802 ml  Net -1332 ml     Assessment/Plan:  70 y.o. female is s/p TDC and L brachiocephalic fistula 2 Days Post-Op   L hand well perfused Pulsatile flow at anastomosis but no flow beyond this; may be occluded.  Will notify surgeon.  Cephalic vein was already thrombectomized intraop so likely will need new fistula creation at a later date Plans  noted for transfusion this morning    Dagoberto Ligas, PA-C [facility] [phone] 01/12/2021 7:30 AM

## 2021-01-13 ENCOUNTER — Ambulatory Visit: Payer: Medicare Other | Admitting: Pulmonary Disease

## 2021-01-13 LAB — RENAL FUNCTION PANEL
Albumin: 2.1 g/dL — ABNORMAL LOW (ref 3.5–5.0)
Anion gap: 9 (ref 5–15)
BUN: 29 mg/dL — ABNORMAL HIGH (ref 8–23)
CO2: 25 mmol/L (ref 22–32)
Calcium: 7.8 mg/dL — ABNORMAL LOW (ref 8.9–10.3)
Chloride: 99 mmol/L (ref 98–111)
Creatinine, Ser: 3.53 mg/dL — ABNORMAL HIGH (ref 0.44–1.00)
GFR, Estimated: 14 mL/min — ABNORMAL LOW (ref >60)
Glucose, Bld: 150 mg/dL — ABNORMAL HIGH (ref 70–99)
Phosphorus: 3.4 mg/dL (ref 2.5–4.6)
Potassium: 3.3 mmol/L — ABNORMAL LOW (ref 3.5–5.1)
Sodium: 133 mmol/L — ABNORMAL LOW (ref 135–145)

## 2021-01-13 LAB — GLUCOSE, CAPILLARY
Glucose-Capillary: 139 mg/dL — ABNORMAL HIGH (ref 70–99)
Glucose-Capillary: 122 mg/dL — ABNORMAL HIGH (ref 70–99)
Glucose-Capillary: 82 mg/dL (ref 70–99)
Glucose-Capillary: 96 mg/dL (ref 70–99)

## 2021-01-13 LAB — CBC
HCT: 21.7 % — ABNORMAL LOW (ref 36.0–46.0)
Hemoglobin: 7.1 g/dL — ABNORMAL LOW (ref 12.0–15.0)
MCH: 28 pg (ref 26.0–34.0)
MCHC: 32.7 g/dL (ref 30.0–36.0)
MCV: 85.4 fL (ref 80.0–100.0)
Platelets: 290 10*3/uL (ref 150–400)
RBC: 2.54 MIL/uL — ABNORMAL LOW (ref 3.87–5.11)
RDW: 16.6 % — ABNORMAL HIGH (ref 11.5–15.5)
WBC: 11.4 10*3/uL — ABNORMAL HIGH (ref 4.0–10.5)
nRBC: 0.2 % (ref 0.0–0.2)

## 2021-01-13 LAB — HEMOGLOBIN AND HEMATOCRIT, BLOOD
HCT: 26.2 % — ABNORMAL LOW (ref 36.0–46.0)
Hemoglobin: 8.5 g/dL — ABNORMAL LOW (ref 12.0–15.0)

## 2021-01-13 MED ORDER — DRONABINOL 2.5 MG PO CAPS
2.5000 mg | ORAL_CAPSULE | Freq: Every day | ORAL | Status: DC
  Administered 2021-01-13 – 2021-01-17 (×5): 2.5 mg via ORAL
  Filled 2021-01-13 (×5): qty 1

## 2021-01-13 MED ORDER — ENSURE ENLIVE PO LIQD
237.0000 mL | Freq: Three times a day (TID) | ORAL | Status: DC
  Administered 2021-01-13 – 2021-01-14 (×3): 237 mL via ORAL

## 2021-01-13 NOTE — Progress Notes (Addendum)
Nutrition Follow-up  DOCUMENTATION CODES:   Not applicable  INTERVENTION:   -Continue 30 ml Prosource Plus TID, each supplement provides 100 kcals and 15 grams protein -D/c Nepro -Ensure Enlive po TID, each supplement provides 350 kcal and 20 grams of protein  -Continue renal MVI daily -Liberalize diet to regular; received permission from MD  NUTRITION DIAGNOSIS:   Inadequate oral intake related to inability to eat as evidenced by NPO status.  Ongoing  GOAL:   Patient will meet greater than or equal to 90% of their needs  Progressing   MONITOR:   PO intake, Supplement acceptance, Labs, Weight trends, Skin, I & O's  REASON FOR ASSESSMENT:   Consult Assessment of nutrition requirement/status  ASSESSMENT:   Molly [name] is a 70 y.o. female with medical history significant of HTN, diastolic CHF, CKD stage IV, PAD, anemia of chronic kidney disease, and diabetes mellitus type 2 presents with complaints of a chest pain for the last 3 to 4 days.  Patient reports having a right substernal chest heaviness.  Noted associated symptoms of shortness of breath, orthopnea, insomnia, weight gain of approximately 5 pounds in the last week, nausea, vomiting, and belching.  Denies any fever, cough, leg swelling, abdominal pain, or dysuria symptoms.  She had tried taking extra dose of furosemide for 2 days without any change in symptoms.  Previously admitted last month for acute on hypercapnic respiratory failure with hypoxia secondary to diastolic heart failure exacerbation.  9/20- advanced to heart healthy, carb modified diet 9/21- s/p EGD- revealed non-obstructing Schatzki ring; hiatal hernia, Hill grade III gastroesophageal flap valve; hiatal hernia; erythematous mucosa in stomach (biopsied)  9/22- s/p PROCEDURE: Cephalomedullary nail of Left hip fracture 9/26- s/p PROCEDURE:   1) right internal jugular vein tunneled dialysis catheter (19cm) 2) left brachiocephalic arteriovenous fistula  creation 3) left cephalic vein thrombectomy  Reviewed I/O's: +303 ml x 24 hours and -2.9 L since 12/30/20   Case discussed with MD. Pt with poor appetite. Per nursing notes yesterday, pt refusing food. Meal completions 15-50%. Pt is taking Prosource, but with limited acceptance of Nepro. Appetite stimulant added today. Received permission to liberalize diet.   Medications reviewed and include miralax and marinol.   Labs reviewed: Na: 133, K: 3.3, CBGS: 82-122.  Diet Order:   Diet Order             Diet regular Room service appropriate? Yes with Assist; Fluid consistency: Thin  Diet effective now                   EDUCATION NEEDS:   Education needs have been addressed  Skin:  Skin Assessment: Skin Integrity Issues: Skin Integrity Issues:: Incisions Incisions: closed rt groin, lt arm, lt hip, perineum  Last BM:  01/12/21  Height:   Ht Readings from Last 1 Encounters:  01/10/21 '5\' 2"'$  (1.575 m)    Weight:   Wt Readings from Last 1 Encounters:  01/13/21 62.8 kg    Ideal Body Weight:  50 kg  BMI:  Body mass index is 25.32 kg/m.  Estimated Nutritional Needs:   Kcal:  [id number]  Protein:  100-115 grams  Fluid:  1000 ml + UOP    Loistine Chance, RD, LDN, [place] Registered Dietitian II Certified Diabetes Care and Education Specialist Please refer to [facility] for RD and/or RD on-call/weekend/after hours pager

## 2021-01-13 NOTE — Progress Notes (Signed)
PROGRESS NOTE  Molly [name]    DOB: 10/23/1950, 70 y.o.  [id number]  PCP: [name], MD   Code Status: Full Code   DOA: 12/27/2020   LOS: 53  Brief Narrative of Current Hospitalization  Molly [name] is a 70 y.o. female with a PMH significant for HTN, left renal artery stenosis, CHF, CKD stage IV now end-stage, CAD, PAD with bilateral SFA CTO's, anemia of chronic disease, diabetes. They presented from home to the ED on 12/27/2020 with chest pain x 4 days. In the ED, it was found that they had fluid overload and A. fib with RVR. They were treated with BiPAP and medical management per cardiology.  Additionally, during hospitalization, patient had fall with left hip fracture and worsening kidney function transitioned her to start hemodialysis this admission. Patient was admitted to medicine service for further workup and management of complex care of respiratory failure, end-stage kidney, hip fracture as outlined in detail below.  Palliative care, vascular surgery, nephrology, cardiology, Ortho also consulted this admission.  01/13/21 -she feels better than yesterday but is still tired and on 2L O2  Assessment & Plan  Principal Problem:   Acute on chronic respiratory failure with hypoxia and hypercapnia (HCC) Active Problems:   HTN (hypertension)   Goals of care, counseling/discussion   Type 2 diabetes mellitus with stage 3b chronic kidney disease, with long-term current use of insulin (HCC)   Elevated troponin   Acute on chronic heart failure with preserved ejection fraction (HFpEF) ([place])   CAP (community acquired pneumonia)   Anemia   Abnormal CT of the abdomen   Acute postoperative pain of left hip   Fall   Anemia due to end stage renal disease ([place])   Hematuria  Acute on chronic respiratory failure with hypoxia and hypercapnia-previously resolved. Has been stable on 2L O2. Patient is severely deconditioned with acute illnesses.  -ABG was normal yesterday - wean to  room air as tolerated - adding appetite stimulant, per patient and husbands request with limited choices due to comorbidities.  - nutrition consult.  - add osmolite to diet  Acute symptomatic anemia- hgb from 7.6-->6.0 yesterday. Received 1 unit pRBCs and her hgb is 7.1 today. Received an additional unit pRBCs at hemodialysis today. Still no acute bleeding identified. -Posttransfusion CBC pending  Hematuria 2/2 obstructing ureteral stone on left. Asymptomatic. Left ureteral stent placed 9/15. - f/u urology  A. fib with RVR-rate controlled -Continue amiodarone load x7 days total, followed by 200 mg for 3 weeks -Cardiology signed off today -Continue Eliquis  ESRD- now on HD T,Th,S schedule. -Vascular following - Patient will need to follow-up outpatient in 4 to 6 weeks for fistula duplex (around October 31) - RFP am - CLIP completed  Left hip fracture-after fall while inpatient. patient recovering well from fixation on 9/22. -PT/OT and weightbearing as tolerated -Follow-up with Ortho postop 2 weeks for suture removal and x-rays.  (Around October 7) - [facility] consulted for SNF vs CIR placement  HTN- moderately well controlled with low diastolics.  - amiodorone, imdur, metoprolol  T2DM- well controlled with diet - discontinue sSSI  DVT prophylaxis:  apixaban (ELIQUIS) tablet 5 mg   Diet:  Diet Orders (From admission, onward)     Start     Ordered   01/10/21 1444  Diet renal/carb modified with fluid restriction Fluid restriction: 1200 mL Fluid; Room service appropriate? Yes; Fluid consistency: Thin  Diet effective now       Question Answer Comment  Fluid  restriction: 1200 mL Fluid   Room service appropriate? Yes   Fluid consistency: Thin      01/10/21 1444            Subjective 01/13/21    Patient is more alert today. She has no complaints or concerns. Her husband would like for her to eat more protein.   Disposition Plan & Communication  Status is:  Inpatient  Remains inpatient appropriate because:Ongoing diagnostic testing needed not appropriate for outpatient work up  Dispo: The patient is from: Home              Anticipated d/c is to: CIR vs SNF              Patient currently is not medically stable to d/c.   Difficult to place patient No  Family Communication: husband at bedside   Consults, Procedures, Significant Events  Consultants:  Nephrology Ortho  Urology  cardiology  Procedures/significant events:  HD Urethral stent Internal hip fixation  Antimicrobials:  Anti-infectives (From admission, onward)    Start     Dose/Rate Route Frequency Ordered Stop   01/10/21 0904  ceFAZolin (ANCEF) 2-4 GM/100ML-% IVPB       Note to Pharmacy: Odelia Gage   : cabinet override      01/10/21 0904 01/10/21 2114   01/07/21 0230  ceFAZolin (ANCEF) IVPB 2g/100 mL premix        2 g 200 mL/hr over 30 Minutes Intravenous Every 12 hours 01/06/21 1711 01/07/21 0252   01/06/21 2100  ceFAZolin (ANCEF) IVPB 2g/100 mL premix  Status:  Discontinued        2 g 200 mL/hr over 30 Minutes Intravenous Every 6 hours 01/06/21 1649 01/06/21 1711   01/06/21 1400  ceFAZolin (ANCEF) IVPB 2g/100 mL premix        2 g 200 mL/hr over 30 Minutes Intravenous On call to O.R. 01/05/21 1926 01/06/21 1441   12/31/20 1330  cefTRIAXone (ROCEPHIN) 1 g in sodium chloride 0.9 % 100 mL IVPB        1 g 200 mL/hr over 30 Minutes Intravenous  Once 12/31/20 1315 12/31/20 1350   12/31/20 1000  cefTRIAXone (ROCEPHIN) 1 g in sodium chloride 0.9 % 100 mL IVPB  Status:  Discontinued        1 g 200 mL/hr over 30 Minutes Intravenous Every 24 hours 12/30/20 1202 12/31/20 1315   12/27/20 1530  cefTRIAXone (ROCEPHIN) 2 g in sodium chloride 0.9 % 100 mL IVPB  Status:  Discontinued        2 g 200 mL/hr over 30 Minutes Intravenous Every 24 hours 12/27/20 1435 12/30/20 1202        Objective   Vitals:   01/12/21 1601 01/12/21 2039 01/13/21 0629 01/13/21 0751  BP: (!)  125/44 (!) 129/44 (!) 134/47 (!) 118/53  Pulse:  77 77 73  Resp: '16 18 16 15  '$ Temp: 98.6 F (37 C) 97.7 F (36.5 C)    TempSrc: Rectal Oral    SpO2: 100% 99% 100% 100%  Weight:    62.8 kg  Height:        Intake/Output Summary (Last 24 hours) at 01/13/2021 0757 Last data filed at 01/12/2021 1049 Gross per 24 hour  Intake 303.33 ml  Output --  Net 303.33 ml    Filed Weights   01/11/21 0850 01/11/21 1302 01/13/21 0751  Weight: 64.4 kg 62.6 kg 62.8 kg    Patient BMI: Body mass index is 25.32 kg/m.  Physical Exam: General: alert, NAD HEENT: atraumatic, clear conjunctiva, anicteric sclera, moist mucus membranes, hearing grossly normal Respiratory:  normal respiratory effort. [place] in place. Speaking in full sentences Cardiovascular: normal S1/S2,  RRR, no JVD, murmurs, rubs, gallops, quick capillary refill  Nervous: Alert. no gross focal neurologic deficits, normal speech Extremities: moves all equally, no edema, normal tone Skin: dry, intact, normal temperature, normal color, No rashes, lesions or ulcers Psychiatry: depressed mood  Labs   I have personally reviewed following labs and imaging studies CBC    Component Value Date/Time   WBC 11.4 (H) 01/13/2021 0227   RBC 2.54 (L) 01/13/2021 0227   HGB 7.1 (L) 01/13/2021 0227   HGB 12.5 08/19/2019 1111   HCT 21.7 (L) 01/13/2021 0227   HCT 39.4 08/19/2019 1111   PLT 290 01/13/2021 0227   PLT 299 08/19/2019 1111   MCV 85.4 01/13/2021 0227   MCV 86 08/19/2019 1111   MCH 28.0 01/13/2021 0227   MCHC 32.7 01/13/2021 0227   RDW 16.6 (H) 01/13/2021 0227   RDW 12.7 08/19/2019 1111   LYMPHSABS 1.3 11/26/2020 1226   MONOABS 0.5 11/26/2020 1226   EOSABS 0.1 11/26/2020 1226   BASOSABS 0.0 11/26/2020 1226   Renal function panel  Imaging Studies  No results found. Medications   Scheduled Meds:  (feeding supplement) PROSource Plus  30 mL Oral TID BM   sodium chloride   Intravenous Once   sodium chloride   Intravenous Once    acetaminophen  1,000 mg Oral TID   amiodarone  200 mg Oral Daily   apixaban  5 mg Oral BID   Chlorhexidine Gluconate Cloth  6 each Topical Q0600   cilostazol  50 mg Oral BID   diclofenac Sodium  2 g Topical QID   diphenhydrAMINE  25 mg Oral Once   ezetimibe  10 mg Oral Daily   famotidine  20 mg Oral Daily   feeding supplement (NEPRO CARB STEADY)  237 mL Oral BID BM   FLUoxetine  10 mg Oral Daily   isosorbide mononitrate  30 mg Oral Daily   lidocaine  1 patch Transdermal Q24H   metoprolol succinate  50 mg Oral Daily   multivitamin  1 tablet Oral QHS   pantoprazole  40 mg Oral QHS   polyethylene glycol  17 g Oral Daily   rosuvastatin  10 mg Oral Daily   Continuous Infusions:  sodium chloride 10 mL/hr at 01/06/21 1409     LOS: 17 days   Time spent: >14mn   Alva Kuenzel L Valita Righter, DO [facility] 01/13/2021, 7:57 AM   To contact the [facility] CenterAttending or Consulting provider for this patient: Check the care team in [facility]for a) attending/consulting TLa Minitaprovider listed and b) the TNav[facility] Harborteam listed Log into [email or web address] and use [place]'s universal password to access. If you do not have the password, please contact the hospital operator. Locate the [facility] Cityprovider you are looking for under [facility] and page to a number that you can be directly reached. If you still have difficulty reaching the provider, please page the [facility](Director on Call) for the Hospitalists listed on amion for assistance.

## 2021-01-13 NOTE — Progress Notes (Signed)
PT Cancellation Note  Patient Details Name: ZOEII REINIG MRN: [id number] DOB: [birth year]   Cancelled Treatment:    Reason Eval/Treat Not Completed: Patient at procedure or test/unavailable - at HD, will check back.  Stacie Glaze, PT DPT Acute Rehabilitation Services Pager [phone]  Office [phone]    Louis Matte 01/13/2021, 11:14 AM

## 2021-01-13 NOTE — Progress Notes (Signed)
Physical Therapy Treatment Patient Details Name: Molly [name] MRN: [id number] DOB: 1951/03/14 Today's Date: 01/13/2021   History of Present Illness This is a 70 y.o. female admitted 9/12 with SOB and chest pain.  HTN crisis and afib with RVR.  Pt had fall while in hospital resulting in left hip fx 9/14. Was placed on bedrest. Pt found to have obstructive uropathy and had a ureteral stent placed9/15.  Hemodialysis initiated 9/17 due to uremia with pt with right femoral temporary HD cath in place.  Pt had fall while in hospital resulting in left hip fx and underwent IM nail left hip 9/22.   9/26 s/p R IJ venin tunneled dialysis cath, L brachiocephalic arteriovenous fistula creation and left cephalic vein thrombectomy. PMH: HTN, CHF, diabetes mellitus type 2,  afib, and CKD    PT Comments    The pt remains very lethargic today, but was able to maintain alertness with max cues. The pt continues to need maxA-totalA to complete bed mobility, but was able to progress to sitting at the EOB for 10 min this session with minG-modA for balance. The pt remained lethargic, stating "night night" or "I am very sleepy" throughout the entire session, but would open her eyes intermittently with max cues. The pt will continue to benefit from max therapies acutely as well as following d/c to facilitate return to independence.     Recommendations for follow up therapy are one component of a multi-disciplinary discharge planning process, led by the attending physician.  Recommendations may be updated based on patient status, additional functional criteria and insurance authorization.  Follow Up Recommendations  CIR;Supervision/Assistance - 24 hour     Equipment Recommendations  None recommended by PT    Recommendations for Other Services       Precautions / Restrictions Precautions Precautions: Fall Restrictions Weight Bearing Restrictions: Yes LLE Weight Bearing: Weight bearing as tolerated     Mobility   Bed Mobility Overal bed mobility: Needs Assistance Bed Mobility: Supine to Sit;Sit to Supine     Supine to sit: Max assist;HOB elevated Sit to supine: Max assist;+2 for physical assistance;HOB elevated   General bed mobility comments: pt able to assist with some leg movements and pulling with BUE on bed rails, needed maxA with bed pad to move towards EOB and complete scooting to EOB. maxA of 2 to return to supine.    Transfers Overall transfer level: Needs assistance   Transfers: Lateral/Scoot Transfers          Lateral/Scoot Transfers: Total assist General transfer comment: pt unable to assist with lateral scoot to reposition in bed     Balance Overall balance assessment: Needs assistance Sitting-balance support: Single extremity supported Sitting balance-Leahy Scale: Poor Sitting balance - Comments: minG to modA to maintain static sittingEOB Postural control: Posterior lean;Right lateral lean                                  Cognition Arousal/Alertness: Lethargic Behavior During Therapy: Flat affect Overall Cognitive Status: Difficult to assess Area of Impairment: Orientation;Attention;Following commands;Problem solving                 Orientation Level: Disoriented to;Time;Situation Current Attention Level: Focused   Following Commands: Follows one step commands inconsistently;Follows one step commands with increased time     Problem Solving: Slow processing;Decreased initiation;Requires verbal cues General Comments: pt very lethargic and only following simple commands intermittently. pt maintaining eyes  closed until directly instructed not to, then needing repeated cues to maintain alertness. pt continues to state "night night" through session, but was able to be redirected back to task 80% of the time. pt calling out for her mother at times (spouse states she does this when distressed)      Exercises General Exercises - Lower Extremity Ankle  Circles/Pumps: AROM;Both;5 reps;Supine Long Arc Quad: AROM;Both;10 reps;Seated    General Comments General comments (skin integrity, edema, etc.): VSS on 2L      Pertinent Vitals/Pain Pain Assessment: Faces Faces Pain Scale: Hurts even more Pain Location: left hip Pain Descriptors / Indicators: Discomfort;Grimacing;Guarding Pain Intervention(s): Limited activity within patient's tolerance;Monitored during session;Repositioned     PT Goals (current goals can now be found in the care plan section) Acute Rehab PT Goals Patient Stated Goal: pt: to sleep, spouse: to get her up and moving PT Goal Formulation: With patient/family Time For Goal Achievement: 01/21/21 Potential to Achieve Goals: Fair Progress towards PT goals: Progressing toward goals    Frequency    Min 6X/week      PT Plan Current plan remains appropriate       AM-PAC PT "6 Clicks" Mobility   Outcome Measure  Help needed turning from your back to your side while in a flat bed without using bedrails?: Total Help needed moving from lying on your back to sitting on the side of a flat bed without using bedrails?: Total Help needed moving to and from a bed to a chair (including a wheelchair)?: Total Help needed standing up from a chair using your arms (e.g., wheelchair or bedside chair)?: Total Help needed to walk in hospital room?: Total Help needed climbing 3-5 steps with a railing? : Total 6 Click Score: 6    End of Session Equipment Utilized During Treatment: Gait belt;Oxygen Activity Tolerance: Patient limited by lethargy;Patient limited by fatigue Patient left: with call bell/phone within reach;in bed;with bed alarm set;with family/visitor present Nurse Communication: Mobility status PT Visit Diagnosis: Muscle weakness (generalized) (M62.81);Pain Pain - Right/Left: Left Pain - part of body: Hip     Time: [id number] PT Time Calculation (min) (ACUTE ONLY): 28 min  Charges:  $Therapeutic Activity:  23-37 mins                     West Carbo, PT, DPT   Acute Rehabilitation Department Pager #: [phone]   [name] 01/13/2021, 5:47 PM

## 2021-01-13 NOTE — Plan of Care (Signed)

## 2021-01-13 NOTE — Progress Notes (Signed)
[facility] Progress Note   70 year old lady with history of hypertension left renal artery stenosis [id number] diastolic dysfunction chronic kidney disease stage IV peripheral artery disease bilateral  superficial femoral artery obstruction.  Anemia, diabetes.  Presented 12/27/2020 with chest pain.  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 Now s/p left ureteral stent placement but with no improvement. -HD started for uremia & failed lasix challenge. Rt fem temp line placed with IR on 9/18.  Hemodialysis started 01/01/2021.  HD on 01/09/2023, 9/27 with 1.8L UF  [facility] on TTS schedule to arrive at 10:00 for 10:25 chair time. On pt's first appt, pt will need to arrive at 9:25 in order to complete paperwork prior to first treatment. This will be after she's completed rehab as she likely will be CIR.  Very weak bruit in left upper arm fistula which was thrombectomized intraop (also very pulsatile at the inflow) +  RIJ TC on 01/10/2021.    Seen on  HD today at 835A 133/71 1L net UF as tolerated through the RIJ TC on a 4K bath Tolerating treatment so far today She received 1 unit yest and will only give 1 unit today with HD. No signs of active bleeding.  Next HD planned for Sat.  2.Hematuria Left obstructive uropathy-  -in the setting of mildly obstructing 6 mm distal left ureter renal stone seen on CT urogram. S/p left ureteral stent on 9/15. ANCA and lupus serologies wnl. Appreciate urology's assistance   3.Intertrochanteric hip fracture- s/p fall in the hospital.  -ortho on board, preop clearance for repair     4.Epistaxis- overnight 9/14, since resolved. Thought to be due to nasal canula.  Anca serologies neg   5.Left renal artery stenosis- Hx of 80-90% proximal left renal artery  stenosis.   6.Hypertension- BP well controlled on norvasc, metoprolol, and hydralazine.  Ultrafiltration with dialysis blood pressure control improved   7.Hyponatremia-resolved   8.Afib w/ RVR-on amio. Hep gtt on hold given epistaxis, which has resolved.   9.Anemia due to chronic kidney disease-  Hgb decr and will need transfusion with 2 units (1 unit 9/28 and will give 1 unit on hd 9/29).   10.Diabetes Mellitus Type 2 with Hyperglycemia- -per primary  Subjective:   C/o pain especially right ankle but denies dyspnea, fever, chills   Objective:   BP (!) 122/56   Pulse 73   Temp 97.7 F (36.5 C) (Oral)   Resp 13   Ht '5\' 2"'$  (1.575 m)   Wt 62.8 kg   SpO2 100%   BMI 25.32 kg/m   Intake/Output Summary (Last 24 hours) at 01/13/2021 0836 Last data filed at 01/12/2021 1049 Gross per 24 hour  Intake 303.33 ml  Output --  Net 303.33 ml   Weight change:   Physical Exam: Gen: Calm laying flat in bed, confused but very pleasant and cooperative CVS:RRR, no murmurs Resp: trace bibasilar, normal WOB [id number], nontender Ext:no edema, RIJ TC Neuro: pleasant this AM and  moving all ext spontaneously Access: Lt BCF weak bruit and very pulsatile at the inflow  Imaging: No results found.  Labs: BMET Recent Labs  Lab 01/07/21 0229 01/08/21 0720 01/09/21 0326 01/10/21 0456 01/11/21 0059 01/12/21 0340 01/13/21 0227  NA 136 135 134* 131* 133* 134* 133*  K 3.8 4.1 3.5 3.4* 3.8  3.4* 3.3*  CL 101 99 97* 95* 98 100 99  CO2 '25 25 28 24 22 26 25  '$ GLUCOSE 132* 134* 137* 118* 102* 149* 150*  BUN 26* 39* 25* 34* 42* 18 29*  CREATININE 3.77* 4.83* 3.67* 4.35* 4.93* 2.67* 3.53*  CALCIUM 8.3* 8.3* 8.1* 7.9* 8.0* 7.6* 7.8*  PHOS 4.0 4.4 3.8 4.1 5.2* 2.8 3.4   CBC Recent Labs  Lab 01/10/21 0456 01/11/21 0059 01/12/21 0340 01/12/21 1259 01/13/21 0227  WBC 8.1 11.1* 11.8*  --  11.4*  HGB 7.1* 7.6* 6.0* 7.2* 7.1*  HCT 22.9* 24.3* 18.7* 22.7* 21.7*  MCV 86.4 86.5 83.9  --  85.4   PLT 277 349 265  --  290    Medications:     (feeding supplement) PROSource Plus  30 mL Oral TID BM   sodium chloride   Intravenous Once   sodium chloride   Intravenous Once   acetaminophen  1,000 mg Oral TID   amiodarone  200 mg Oral Daily   apixaban  5 mg Oral BID   Chlorhexidine Gluconate Cloth  6 each Topical Q0600   cilostazol  50 mg Oral BID   diclofenac Sodium  2 g Topical QID   diphenhydrAMINE  25 mg Oral Once   ezetimibe  10 mg Oral Daily   famotidine  20 mg Oral Daily   feeding supplement (NEPRO CARB STEADY)  237 mL Oral BID BM   FLUoxetine  10 mg Oral Daily   isosorbide mononitrate  30 mg Oral Daily   lidocaine  1 patch Transdermal Q24H   metoprolol succinate  50 mg Oral Daily   multivitamin  1 tablet Oral QHS   pantoprazole  40 mg Oral QHS   polyethylene glycol  17 g Oral Daily   rosuvastatin  10 mg Oral Daily      Otelia Santee, MD 01/13/2021, 8:36 AM

## 2021-01-13 NOTE — Progress Notes (Signed)
    [facility] will sign off.   Medication Recommendations:  No change in therapy.   Other recommendations (labs, testing, etc):  NA Follow up as an outpatient:  Follow up is arranged.

## 2021-01-14 LAB — RENAL FUNCTION PANEL
Albumin: 2.1 g/dL — ABNORMAL LOW (ref 3.5–5.0)
Anion gap: 8 (ref 5–15)
BUN: 12 mg/dL (ref 8–23)
CO2: 25 mmol/L (ref 22–32)
Calcium: 7.8 mg/dL — ABNORMAL LOW (ref 8.9–10.3)
Chloride: 100 mmol/L (ref 98–111)
Creatinine, Ser: 2.03 mg/dL — ABNORMAL HIGH (ref 0.44–1.00)
GFR, Estimated: 26 mL/min — ABNORMAL LOW (ref >60)
Glucose, Bld: 112 mg/dL — ABNORMAL HIGH (ref 70–99)
Phosphorus: 2.1 mg/dL — ABNORMAL LOW (ref 2.5–4.6)
Potassium: 3.9 mmol/L (ref 3.5–5.1)
Sodium: 133 mmol/L — ABNORMAL LOW (ref 135–145)

## 2021-01-14 LAB — CBC
HCT: 25.3 % — ABNORMAL LOW (ref 36.0–46.0)
Hemoglobin: 8.3 g/dL — ABNORMAL LOW (ref 12.0–15.0)
MCH: 27.7 pg (ref 26.0–34.0)
MCHC: 32.8 g/dL (ref 30.0–36.0)
MCV: 84.3 fL (ref 80.0–100.0)
Platelets: 270 10*3/uL (ref 150–400)
RBC: 3 MIL/uL — ABNORMAL LOW (ref 3.87–5.11)
RDW: 18.3 % — ABNORMAL HIGH (ref 11.5–15.5)
WBC: 10.4 10*3/uL (ref 4.0–10.5)
nRBC: 0 % (ref 0.0–0.2)

## 2021-01-14 LAB — BPAM RBC
Blood Product Expiration Date: 202210062359
Blood Product Expiration Date: 202210062359
ISSUE DATE / TIME: 202209280739
ISSUE DATE / TIME: 202209290924
Unit Type and Rh: 7300
Unit Type and Rh: 7300

## 2021-01-14 LAB — TYPE AND SCREEN
ABO/RH(D): B POS
Antibody Screen: NEGATIVE
Unit division: 0
Unit division: 0

## 2021-01-14 LAB — GLUCOSE, CAPILLARY
Glucose-Capillary: 103 mg/dL — ABNORMAL HIGH (ref 70–99)
Glucose-Capillary: 116 mg/dL — ABNORMAL HIGH (ref 70–99)
Glucose-Capillary: 93 mg/dL (ref 70–99)

## 2021-01-14 LAB — AMMONIA: Ammonia: <10 umol/L (ref 9–35)

## 2021-01-14 MED ORDER — DARBEPOETIN ALFA 100 MCG/0.5ML IJ SOSY
100.0000 ug | PREFILLED_SYRINGE | INTRAMUSCULAR | Status: DC
  Administered 2021-01-15: 100 ug via INTRAVENOUS
  Filled 2021-01-14 (×2): qty 0.5

## 2021-01-14 NOTE — Progress Notes (Signed)
Inpatient Rehab Admissions Coordinator:   Per thearpy recommendations patient was screened for CIR candidacy by Clemens Catholic, MS, CCC-SLP. At this time, Pt. is lethargic, max A for bed mobility, unable to attempt transfers  Pt. may have potential to progress to becoming a potential CIR candidate, so CIR admissions team will follow and monitor for progress and participation with therapies and place consult order if Pt. appears to be an appropriate candidate. If lethargy persists, may walt to consider other rehab venues. Please contact me with any questions.    Clemens Catholic, [place], [place] Admissions Coordinator  [phone] ([place]) [phone] (office)

## 2021-01-14 NOTE — Progress Notes (Signed)
PROGRESS NOTE  Molly [name]    DOB: [birth year], 70 y.o.  [id number]  PCP: [name], MD   Code Status: Full Code   DOA: 12/27/2020   LOS: 26  Brief Narrative of Current Hospitalization  Molly [name] is a 70 y.o. female with a PMH significant for HTN, left renal artery stenosis, CHF, CKD stage IV now end-stage, CAD, PAD with bilateral SFA CTO's, anemia of chronic disease, diabetes. They presented from home to the ED on 12/27/2020 with chest pain x 4 days. In the ED, it was found that they had fluid overload and A. fib with RVR. They were treated with BiPAP and medical management per cardiology.  Additionally, during hospitalization, patient had fall with left hip fracture and worsening kidney function transitioned her to start hemodialysis this admission. Patient was admitted to medicine service for further workup and management of complex care of respiratory failure, end-stage kidney, hip fracture as outlined in detail below.  Palliative care, vascular surgery, nephrology, cardiology, Ortho also consulted this admission.  01/14/21 -more coherent per husband  Assessment & Plan  Principal Problem:   Acute on chronic respiratory failure with hypoxia and hypercapnia (HCC) Active Problems:   HTN (hypertension)   Goals of care, counseling/discussion   Type 2 diabetes mellitus with stage 3b chronic kidney disease, with long-term current use of insulin (HCC)   Elevated troponin   Acute on chronic heart failure with preserved ejection fraction (HFpEF) ([place])   CAP (community acquired pneumonia)   Anemia   Abnormal CT of the abdomen   Acute postoperative pain of left hip   Fall   Anemia due to end stage renal disease ([place])   Hematuria  Acute on chronic respiratory failure with hypoxia and hypercapnia-previously resolved. ORA this morning?? But on 2L O2 while examining her. Patient is severely deconditioned with acute illnesses but seems to have had improvement yesterday. - wean  to room air as tolerated - adding appetite stimulant, per patient and husbands request with limited choices due to comorbidities.  - nutrition consulted and liberalized diet - add osmolite to diet  Acute symptomatic anemia- hgb stable today at 8.3 s/p transfusion yesterday.  -CBC am  Hematuria 2/2 obstructing ureteral stone on left. Asymptomatic. Left ureteral stent placed 9/15. - f/u urology  A. fib with RVR-rate controlled - 200 mg for 3 weeks -Cardiology signed off -Continue Eliquis  ESRD- now on HD T,Th,S schedule. -Vascular following - Patient will need to follow-up outpatient in 4 to 6 weeks for fistula duplex (around October 31) - RFP am - CLIP completed  Left hip fracture-after fall while inpatient. patient recovering well from fixation on 9/22. -PT/OT and weightbearing as tolerated -Follow-up with Ortho postop 2 weeks for suture removal and x-rays.  (Around October 7) - [facility] consulted for SNF vs CIR placement  HTN- moderately well controlled with low diastolics.  - amiodorone, imdur, metoprolol  T2DM- well controlled with diet - discontinue sSSI  DVT prophylaxis:  apixaban (ELIQUIS) tablet 5 mg   Diet:  Diet Orders (From admission, onward)     Start     Ordered   01/13/21 1510  Diet regular Room service appropriate? Yes with Assist; Fluid consistency: Thin  Diet effective now       Question Answer Comment  Room service appropriate? Yes with Assist   Fluid consistency: Thin      01/13/21 1509            Subjective 01/14/21    Patient denies  any concerns today. She feels she is doing well adter dialysis yesterday.  Disposition Plan & Communication  Status is: Inpatient  Remains inpatient appropriate because:Ongoing diagnostic testing needed not appropriate for outpatient work up  Dispo: The patient is from: Home              Anticipated d/c is to: CIR vs SNF              Patient currently is not medically stable to d/c.   Difficult to place  patient No  Family Communication: husband at bedside   Consults, Procedures, Significant Events  Consultants:  Nephrology Ortho  Urology  cardiology  Procedures/significant events:  HD Urethral stent Internal hip fixation  Antimicrobials:  Anti-infectives (From admission, onward)    Start     Dose/Rate Route Frequency Ordered Stop   01/10/21 0904  ceFAZolin (ANCEF) 2-4 GM/100ML-% IVPB       Note to Pharmacy: Odelia Gage   : cabinet override      01/10/21 0904 01/10/21 2114   01/07/21 0230  ceFAZolin (ANCEF) IVPB 2g/100 mL premix        2 g 200 mL/hr over 30 Minutes Intravenous Every 12 hours 01/06/21 1711 01/07/21 0252   01/06/21 2100  ceFAZolin (ANCEF) IVPB 2g/100 mL premix  Status:  Discontinued        2 g 200 mL/hr over 30 Minutes Intravenous Every 6 hours 01/06/21 1649 01/06/21 1711   01/06/21 1400  ceFAZolin (ANCEF) IVPB 2g/100 mL premix        2 g 200 mL/hr over 30 Minutes Intravenous On call to O.R. 01/05/21 1926 01/06/21 1441   12/31/20 1330  cefTRIAXone (ROCEPHIN) 1 g in sodium chloride 0.9 % 100 mL IVPB        1 g 200 mL/hr over 30 Minutes Intravenous  Once 12/31/20 1315 12/31/20 1350   12/31/20 1000  cefTRIAXone (ROCEPHIN) 1 g in sodium chloride 0.9 % 100 mL IVPB  Status:  Discontinued        1 g 200 mL/hr over 30 Minutes Intravenous Every 24 hours 12/30/20 1202 12/31/20 1315   12/27/20 1530  cefTRIAXone (ROCEPHIN) 2 g in sodium chloride 0.9 % 100 mL IVPB  Status:  Discontinued        2 g 200 mL/hr over 30 Minutes Intravenous Every 24 hours 12/27/20 1435 12/30/20 1202        Objective   Vitals:   01/13/21 1204 01/13/21 1536 01/13/21 2202 01/14/21 0803  BP: (!) 146/56 (!) 130/46 (!) 133/46 (!) 136/51  Pulse: 90 77 75 76  Resp: '16 16 16 16  '$ Temp: 98.9 F (37.2 C) 98.7 F (37.1 C) 99.2 F (37.3 C) 98.1 F (36.7 C)  TempSrc: Oral Oral Oral Oral  SpO2: 97% 98% 94% 97%  Weight:      Height:        Intake/Output Summary (Last 24 hours) at  01/14/2021 0814 Last data filed at 01/13/2021 1800 Gross per 24 hour  Intake 645 ml  Output 1000 ml  Net -355 ml    Filed Weights   01/11/21 0850 01/11/21 1302 01/13/21 0751  Weight: 64.4 kg 62.6 kg 62.8 kg    Patient BMI: Body mass index is 25.32 kg/m.   Physical Exam: General: alert, NAD HEENT: atraumatic, clear conjunctiva, anicteric sclera, moist mucus membranes, hearing grossly normal Respiratory:  normal respiratory effort. [place] in place. Speaking in full sentences Cardiovascular: normal S1/S2,  RRR, no JVD, murmurs, rubs, gallops, quick capillary refill  Nervous: Alert. no gross focal neurologic deficits, normal speech Extremities: moves all equally, no edema, normal tone Skin: dry, intact, normal temperature, normal color, No rashes, lesions or ulcers Psychiatry: depressed mood  Labs   I have personally reviewed following labs and imaging studies CBC    Component Value Date/Time   WBC 10.4 01/14/2021 0329   RBC 3.00 (L) 01/14/2021 0329   HGB 8.3 (L) 01/14/2021 0329   HGB 12.5 08/19/2019 1111   HCT 25.3 (L) 01/14/2021 0329   HCT 39.4 08/19/2019 1111   PLT 270 01/14/2021 0329   PLT 299 08/19/2019 1111   MCV 84.3 01/14/2021 0329   MCV 86 08/19/2019 1111   MCH 27.7 01/14/2021 0329   MCHC 32.8 01/14/2021 0329   RDW 18.3 (H) 01/14/2021 0329   RDW 12.7 08/19/2019 1111   LYMPHSABS 1.3 11/26/2020 1226   MONOABS 0.5 11/26/2020 1226   EOSABS 0.1 11/26/2020 1226   BASOSABS 0.0 11/26/2020 1226   Renal function panel  Imaging Studies  No results found. Medications   Scheduled Meds:  (feeding supplement) PROSource Plus  30 mL Oral TID BM   sodium chloride   Intravenous Once   sodium chloride   Intravenous Once   acetaminophen  1,000 mg Oral TID   amiodarone  200 mg Oral Daily   apixaban  5 mg Oral BID   Chlorhexidine Gluconate Cloth  6 each Topical Q0600   cilostazol  50 mg Oral BID   diclofenac Sodium  2 g Topical QID   diphenhydrAMINE  25 mg Oral Once    dronabinol  2.5 mg Oral QAC lunch   ezetimibe  10 mg Oral Daily   famotidine  20 mg Oral Daily   feeding supplement  237 mL Oral TID BM   FLUoxetine  10 mg Oral Daily   isosorbide mononitrate  30 mg Oral Daily   lidocaine  1 patch Transdermal Q24H   metoprolol succinate  50 mg Oral Daily   multivitamin  1 tablet Oral QHS   pantoprazole  40 mg Oral QHS   polyethylene glycol  17 g Oral Daily   rosuvastatin  10 mg Oral Daily   Continuous Infusions:  sodium chloride 10 mL/hr at 01/06/21 1409     LOS: 18 days   Time spent: >33mn   CRicharda Osmond DO [facility] 01/14/2021, 8:14 AM   To contact the TS[facility] HospitalAttending or Consulting provider for this patient: Check the care team in [facility] PLLCfor a) attending/consulting TAssariaprovider listed and b) the TCalifornia [facility] LLCteam listed Log into [email or web address] and use [place]'s universal password to access. If you do not have the password, please contact the hospital operator. Locate the TPe[facility] Center LLCprovider you are looking for under [facility] and page to a number that you can be directly reached. If you still have difficulty reaching the provider, please page the [facility](Director on Call) for the Hospitalists listed on amion for assistance.

## 2021-01-14 NOTE — Progress Notes (Signed)
Inpatient Rehab Admissions Coordinator:   Per therapu recommendations,  patient was screened for CIR candidacy by Clemens Catholic, MS, CCC-SLP. At this time, Pt. Appears to demonstrate medical necessity, functional decline, and ability to tolerate intensity of CIR. Pt. is a potential candidate for CIR. I will place   order for rehab consult per protocol for full assessment of candidacy. Please contact me any with questions.Clemens Catholic, [place], [place] Admissions Coordinator  [phone] ([place]) [phone] (office)

## 2021-01-14 NOTE — Progress Notes (Signed)
Nutrition Follow-up  DOCUMENTATION CODES:   Not applicable  INTERVENTION:   -Continue 30 ml Prosource Plus TID, each supplement provides 100 kcals and 15 grams protein -Continue Ensure Enlive po TID, each supplement provides 350 kcal and 20 grams of protein  -Continue renal MVI daily -Continue liberalized diet of regular  NUTRITION DIAGNOSIS:   Inadequate oral intake related to inability to eat as evidenced by NPO status.  Progressing; advanced to PO diet since 01/06/21  GOAL:   Patient will meet greater than or equal to 90% of their needs  Progressing   MONITOR:   PO intake, Supplement acceptance, Labs, Weight trends, Skin, I & O's  REASON FOR ASSESSMENT:   Consult Assessment of nutrition requirement/status  ASSESSMENT:   Molly [name] is a 70 y.o. female with medical history significant of HTN, diastolic CHF, CKD stage IV, PAD, anemia of chronic kidney disease, and diabetes mellitus type 2 presents with complaints of a chest pain for the last 3 to 4 days.  Patient reports having a right substernal chest heaviness.  Noted associated symptoms of shortness of breath, orthopnea, insomnia, weight gain of approximately 5 pounds in the last week, nausea, vomiting, and belching.  Denies any fever, cough, leg swelling, abdominal pain, or dysuria symptoms.  She had tried taking extra dose of furosemide for 2 days without any change in symptoms.  Previously admitted last month for acute on hypercapnic respiratory failure with hypoxia secondary to diastolic heart failure exacerbation.  9/20- advanced to heart healthy, carb modified diet 9/21- s/p EGD- revealed non-obstructing Schatzki ring; hiatal hernia, Hill grade III gastroesophageal flap valve; hiatal hernia; erythematous mucosa in stomach (biopsied)  9/22- s/p PROCEDURE: Cephalomedullary nail of Left hip fracture 9/26- s/p PROCEDURE:   1) right internal jugular vein tunneled dialysis catheter (19cm) 2) left brachiocephalic  arteriovenous fistula creation 3) left cephalic vein thrombectomy  Reviewed I/O's: -355 ml x 24 hours and -4.3 L since 12/31/20  UOP: 0 ml x 24 hours  Spoke with pt and husband at bedside. Pt lethargic, but answered RD questions with her eyes closed. Per pt husband, yesterday was a good day for her as she was able to participate with physical therapy well and she is now leaning on her rt side. He is very happy with her progression on HD and shares that this has improved her bowel movements. Pt husband shares that PTA she usually has trouble with diarrhea due to gallbladder surgery.   Per husband appetite is poor, but relates this due to limited choices on renal diet ("they are only so many days you can eat rice and balsamic chicken"). Pt has consumed about 50% of Ensure today and is also taking Prosource supplements.   Notified that pt has been liberalized to regular diet. Assisted pt and husband with ordering breakfast meal and personally placed into the meal ordering system. Pt amenable to consume Ensure and Prosource now. Pt husband thinks that pt will eat if she gets foods that she likes and would like to hold off on cortrak for now.   Medications reviewed and include marinol and miralax.   Labs reviewed: Na: 133, Phos: 2.1, CBGS: 96-103.    Diet Order:   Diet Order             Diet regular Room service appropriate? Yes with Assist; Fluid consistency: Thin  Diet effective now                   EDUCATION NEEDS:  Education needs have been addressed  Skin:  Skin Assessment: Skin Integrity Issues: Skin Integrity Issues:: Incisions Incisions: closed rt groin, lt arm, lt hip, perineum  Last BM:  01/12/21  Height:   Ht Readings from Last 1 Encounters:  01/10/21 '5\' 2"'$  (1.575 m)    Weight:   Wt Readings from Last 1 Encounters:  01/13/21 62.8 kg    Ideal Body Weight:  50 kg  BMI:  Body mass index is 25.32 kg/m.  Estimated Nutritional Needs:   Kcal:   [id number]  Protein:  100-115 grams  Fluid:  1000 ml + UOP    Loistine Chance, RD, LDN, [place] Registered Dietitian II Certified Diabetes Care and Education Specialist Please refer to [facility] for RD and/or RD on-call/weekend/after hours pager

## 2021-01-14 NOTE — Progress Notes (Signed)
Physical Therapy Treatment Patient Details Name: CARMELINA BALDUCCI MRN: [id number] DOB: [birth year] Today's Date: 01/14/2021   History of Present Illness This is a 70 y.o. female admitted 9/12 with SOB and chest pain.  HTN crisis and afib with RVR.  Pt had fall while in hospital resulting in left hip fx 9/14. Was placed on bedrest. Pt found to have obstructive uropathy and had a ureteral stent placed9/15.  Hemodialysis initiated 9/17 due to uremia with pt with right femoral temporary HD cath in place.  Pt had fall while in hospital resulting in left hip fx and underwent IM nail left hip 9/22.   9/26 s/p R IJ venin tunneled dialysis cath, L brachiocephalic arteriovenous fistula creation and left cephalic vein thrombectomy. PMH: HTN, CHF, diabetes mellitus type 2,  afib, and CKD    PT Comments    Patient received in bed, initially quite lethargic and wanting to be left alone to sleep, but able to convince her to participate in therapy. Able to maintain balance at EOB much better today with only close S needed to maintain balance/safety and was able to attempt standing this morning, although unable to clear hips more than 2-3 inches from the surface of the bed. Much more alert and able to hold appropriate conversations at EOS. Education provided regarding importance of return to more normalized sleep schedule to help promote recovery (IE, lights and TV on/blinds open and conversing with family during the day). Left in bed in chair position with all needs met, alarm active. Will continue efforts- continue to feel she may be able to progress to CIR level as medical status improves.     Recommendations for follow up therapy are one component of a multi-disciplinary discharge planning process, led by the attending physician.  Recommendations may be updated based on patient status, additional functional criteria and insurance authorization.  Follow Up Recommendations  CIR;Supervision/Assistance - 24 hour      Equipment Recommendations  None recommended by PT    Recommendations for Other Services       Precautions / Restrictions Precautions Precautions: Fall Restrictions Weight Bearing Restrictions: Yes LLE Weight Bearing: Weight bearing as tolerated     Mobility  Bed Mobility Overal bed mobility: Needs Assistance Bed Mobility: Rolling;Sit to Supine;Sidelying to Sit Rolling: Total assist;+2 for physical assistance Sidelying to sit: Max assist;+2 for physical assistance   Sit to supine: Max assist;+2 for physical assistance   General bed mobility comments: physically resistant to rolling to the right, able to tolerate rolling left much more easily; needed MaxAx2 for side to sit and sit to supine    Transfers Overall transfer level: Needs assistance Equipment used: Rolling walker (2 wheeled) Transfers: Sit to/from Stand Sit to Stand: Max assist;+2 physical assistance         General transfer comment: needed multiple attempts and rocking, but able to clear hips approximately 2-3 inches from bed with heavy physical assist of two persons  Ambulation/Gait             General Gait Details: unable   Stairs             Wheelchair Mobility    Modified Rankin (Stroke Patients Only)       Balance Overall balance assessment: Needs assistance Sitting-balance support: Bilateral upper extremity supported;Feet supported Sitting balance-Leahy Scale: Fair Sitting balance - Comments: close S for midline sitting at EOB   Standing balance support: Bilateral upper extremity supported;During functional activity Standing balance-Leahy Scale: Zero Standing balance comment: unable  to get to full upright today                            Cognition Arousal/Alertness: Lethargic Behavior During Therapy: Flat affect Overall Cognitive Status: Impaired/Different from baseline Area of Impairment: Safety/judgement;Awareness;Problem solving                    Current Attention Level: Sustained     Safety/Judgement: Decreased awareness of safety;Decreased awareness of deficits Awareness: Intellectual Problem Solving: Slow processing;Decreased initiation;Requires verbal cues General Comments: initially very lethargic and just humming "yessss" or "noooo" and wanting to sleep; became much more alert and aware once sitting at EOB and able to follow cues to attempt standing. At EOS in chair position, able to hold appropriate conversation about her progress and therapy POC moving forward!      Exercises      General Comments        Pertinent Vitals/Pain Pain Assessment: Faces Faces Pain Scale: Hurts little more Pain Location: left hip Pain Descriptors / Indicators: Discomfort;Grimacing;Guarding Pain Intervention(s): Limited activity within patient's tolerance;Monitored during session;Repositioned    Home Living                      Prior Function            PT Goals (current goals can now be found in the care plan section) Acute Rehab PT Goals Patient Stated Goal: pt: to sleep, spouse: to get her up and moving PT Goal Formulation: With patient/family Time For Goal Achievement: 01/21/21 Potential to Achieve Goals: Fair Progress towards PT goals: Progressing toward goals    Frequency    Min 6X/week      PT Plan Current plan remains appropriate    Co-evaluation              AM-PAC PT "6 Clicks" Mobility   Outcome Measure  Help needed turning from your back to your side while in a flat bed without using bedrails?: Total Help needed moving from lying on your back to sitting on the side of a flat bed without using bedrails?: Total Help needed moving to and from a bed to a chair (including a wheelchair)?: Total Help needed standing up from a chair using your arms (e.g., wheelchair or bedside chair)?: Total Help needed to walk in hospital room?: Total Help needed climbing 3-5 steps with a railing? : Total 6  Click Score: 6    End of Session Equipment Utilized During Treatment: Gait belt;Oxygen Activity Tolerance: Patient tolerated treatment well Patient left: with call bell/phone within reach;in bed;with bed alarm set;with family/visitor present (bed in chair position) Nurse Communication: Mobility status PT Visit Diagnosis: Muscle weakness (generalized) (M62.81);Pain Pain - Right/Left: Left Pain - part of body: Hip     Time: 4818-5631 PT Time Calculation (min) (ACUTE ONLY): 28 min  Charges:  $Therapeutic Activity: 23-37 mins                    Windell Norfolk, DPT, PN2   Supplemental Physical Therapist [place]    Pager [phone] Acute Rehab Office [phone]

## 2021-01-14 NOTE — Progress Notes (Signed)
[facility] Progress Note   70 year old lady with history of hypertension left renal artery stenosis [id number] diastolic dysfunction chronic kidney disease stage IV peripheral artery disease bilateral  superficial femoral artery obstruction.  Anemia, diabetes.  Presented 12/27/2020 with chest pain.  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 Now s/p left ureteral stent placement but with no improvement. -HD started for uremia & failed lasix challenge. Rt fem temp line placed with IR on 9/18.  Hemodialysis started 01/01/2021.    Has a spot at [facility] on TTS schedule to arrive at 10:00 for 10:25 chair time. On pt's first appt, pt will need to arrive at 9:25 in order to complete paperwork prior to first treatment. This will be after she's completed rehab as she likely will be CIR.  Very weak bruit in left upper arm fistula which was thrombectomized intraop (also very pulsatile at the inflow) +  RIJ TC on 01/10/2021.    Next HD planned for Sat.  2.Hematuria Left obstructive uropathy-  -in the setting of mildly obstructing 6 mm distal left ureter renal stone seen on CT urogram. S/p left ureteral stent on 9/15. ANCA and lupus serologies wnl. Appreciate urology's assistance   3.Intertrochanteric hip fracture- s/p fall in the hospital.  -ortho on board, preop clearance for repair     4.Epistaxis- overnight 9/14, since resolved. Thought to be due to nasal canula.  Anca serologies neg   5.Left renal artery stenosis- Hx of 80-90% proximal left renal artery stenosis. No intervention   6.Hypertension- BP controlled on metop   7.Afib w/ RVR-on amio. Anticoagulation with eliquis   8.Anemia due to chronic kidney disease-  Hgb decr and will need transfusion with 2 units (1 unit 9/28 and 1 unit on hd 9/29).  Will start aranesp 1110mg weekly (Sat) w/ dialysis   9.Diabetes Mellitus Type 2 with Hyperglycemia- -per primary  Subjective:   Denies any complaints this morning   Objective:   BP (!) 136/51 (BP Location: Right Arm)   Pulse 76   Temp 98.1 F (36.7 C) (Oral)   Resp 16   Ht '5\' 2"'$  (1.575 m)   Wt 62.8 kg   SpO2 97%   BMI 25.32 kg/m   Intake/Output Summary (Last 24 hours) at 01/14/2021 1158 Last data filed at 01/13/2021 1800 Gross per 24 hour  Intake 330 ml  Output 0 ml  Net 330 ml   Weight change:   Physical Exam: Gen: Lying in bed, minimally interactive, does not open eyes CVS: Normal rate, no audible murmur Resp: Bilateral chest rise with no increased work of breathing [id number] nontender, nondistended Ext:no edema, RIJ TC Neuro: pleasant this AM and  moving all ext spontaneously Access: Lt BCF weak bruit and very pulsatile at the inflow  Imaging: No results found.  Labs: BMET Recent Labs  Lab 01/08/21 0720 01/09/21 0326 01/10/21 0456 01/11/21 0059 01/12/21 0340 01/13/21 0227 01/14/21 0329  NA 135 134* 131* 133* 134* 133* 133*  K 4.1 3.5 3.4* 3.8 3.4* 3.3* 3.9  CL 99 97* 95* 98 100 99 100  CO2 '25 28 24 22 26 25 25  '$ GLUCOSE 134* 137* 118* 102* 149* 150* 112*  BUN 39* 25* 34* 42* 18 29* 12  CREATININE 4.83* 3.67* 4.35* 4.93* 2.67* 3.53* 2.03*  CALCIUM 8.3* 8.1* 7.9* 8.0* 7.6* 7.8* 7.8*  PHOS  4.4 3.8 4.1 5.2* 2.8 3.4 2.1*   CBC Recent Labs  Lab 01/11/21 0059 01/12/21 0340 01/12/21 1259 01/13/21 0227 01/13/21 1335 01/14/21 0329  WBC 11.1* 11.8*  --  11.4*  --  10.4  HGB 7.6* 6.0* 7.2* 7.1* 8.5* 8.3*  HCT 24.3* 18.7* 22.7* 21.7* 26.2* 25.3*  MCV 86.5 83.9  --  85.4  --  84.3  PLT 349 265  --  290  --  270    Medications:     (feeding supplement) PROSource Plus  30 mL Oral TID BM   sodium chloride   Intravenous Once   sodium chloride   Intravenous Once   acetaminophen  1,000 mg Oral TID   amiodarone  200 mg Oral Daily   apixaban  5 mg Oral  BID   Chlorhexidine Gluconate Cloth  6 each Topical Q0600   cilostazol  50 mg Oral BID   diclofenac Sodium  2 g Topical QID   diphenhydrAMINE  25 mg Oral Once   dronabinol  2.5 mg Oral QAC lunch   ezetimibe  10 mg Oral Daily   famotidine  20 mg Oral Daily   feeding supplement  237 mL Oral TID BM   FLUoxetine  10 mg Oral Daily   isosorbide mononitrate  30 mg Oral Daily   lidocaine  1 patch Transdermal Q24H   metoprolol succinate  50 mg Oral Daily   multivitamin  1 tablet Oral QHS   pantoprazole  40 mg Oral QHS   polyethylene glycol  17 g Oral Daily   rosuvastatin  10 mg Oral Daily      Reesa Chew  01/14/2021, 11:58 AM

## 2021-01-15 LAB — CBC
HCT: 26.7 % — ABNORMAL LOW (ref 36.0–46.0)
Hemoglobin: 8.4 g/dL — ABNORMAL LOW (ref 12.0–15.0)
MCH: 27.3 pg (ref 26.0–34.0)
MCHC: 31.5 g/dL (ref 30.0–36.0)
MCV: 86.7 fL (ref 80.0–100.0)
Platelets: 313 10*3/uL (ref 150–400)
RBC: 3.08 MIL/uL — ABNORMAL LOW (ref 3.87–5.11)
RDW: 19 % — ABNORMAL HIGH (ref 11.5–15.5)
WBC: 9.5 10*3/uL (ref 4.0–10.5)
nRBC: 0 % (ref 0.0–0.2)

## 2021-01-15 LAB — RENAL FUNCTION PANEL
Albumin: 2.2 g/dL — ABNORMAL LOW (ref 3.5–5.0)
Anion gap: 10 (ref 5–15)
BUN: 23 mg/dL (ref 8–23)
CO2: 24 mmol/L (ref 22–32)
Calcium: 7.9 mg/dL — ABNORMAL LOW (ref 8.9–10.3)
Chloride: 99 mmol/L (ref 98–111)
Creatinine, Ser: 2.9 mg/dL — ABNORMAL HIGH (ref 0.44–1.00)
GFR, Estimated: 17 mL/min — ABNORMAL LOW (ref >60)
Glucose, Bld: 113 mg/dL — ABNORMAL HIGH (ref 70–99)
Phosphorus: 2.8 mg/dL (ref 2.5–4.6)
Potassium: 3.7 mmol/L (ref 3.5–5.1)
Sodium: 133 mmol/L — ABNORMAL LOW (ref 135–145)

## 2021-01-15 LAB — GLUCOSE, CAPILLARY
Glucose-Capillary: 90 mg/dL (ref 70–99)
Glucose-Capillary: 142 mg/dL — ABNORMAL HIGH (ref 70–99)

## 2021-01-15 NOTE — Progress Notes (Signed)
Patient states she feels short of breath. UF goal decreased. Patient repositioned.

## 2021-01-15 NOTE — Progress Notes (Signed)
Patient with complaints of shortness of breath. Blood sugar checked with a result of 90. Oxygen saturation 100%. Head of bed raised.

## 2021-01-15 NOTE — Progress Notes (Signed)
PROGRESS NOTE  Molly [name]    DOB: 15-Jun-1950, 70 y.o.  [id number]  PCP: [name], MD   Code Status: Full Code   DOA: 12/27/2020   LOS: 39  Brief Narrative of Current Hospitalization  Molly [name] is a 70 y.o. female with a PMH significant for HTN, left renal artery stenosis, CHF, CKD stage IV now end-stage, CAD, PAD with bilateral SFA CTO's, anemia of chronic disease, diabetes. They presented from home to the ED on 12/27/2020 with chest pain x 4 days. In the ED, it was found that they had fluid overload and A. fib with RVR. They were treated with BiPAP and medical management per cardiology.  Additionally, during hospitalization, patient had fall with left hip fracture and worsening kidney function transitioned her to start hemodialysis this admission. Patient was admitted to medicine service for further workup and management of complex care of respiratory failure, end-stage kidney, hip fracture as outlined in detail below.  Palliative care, vascular surgery, nephrology, cardiology, Ortho also consulted this admission.  01/15/21 -stable  Assessment & Plan  Principal Problem:   Acute on chronic respiratory failure with hypoxia and hypercapnia (HCC) Active Problems:   HTN (hypertension)   Goals of care, counseling/discussion   Type 2 diabetes mellitus with stage 3b chronic kidney disease, with long-term current use of insulin (HCC)   Elevated troponin   Acute on chronic heart failure with preserved ejection fraction (HFpEF) ([place])   CAP (community acquired pneumonia)   Anemia   Abnormal CT of the abdomen   Acute postoperative pain of left hip   Fall   Anemia due to end stage renal disease ([place])   Hematuria  Acute on chronic respiratory failure with hypoxia and hypercapnia-on 2L [name] while at dialysis. Patient is severely deconditioned with acute illnesses but seems to have had improvement yesterday. - wean to room air as tolerated - continue appetite stimulant, per  patient and husbands request with limited choices due to comorbidities.  - nutrition consulted and liberalized diet - add osmolite to diet - to CIR when able  Acute symptomatic anemia- hgb stable today at 8.4  -CBC am  Hematuria 2/2 obstructing ureteral stone on left. Asymptomatic. Left ureteral stent placed 9/15. - f/u urology  A. fib with RVR-rate controlled - 200 mg for 3 weeks -Cardiology signed off -Continue Eliquis  ESRD- now on HD T,Th,S schedule. -Vascular following - Patient will need to follow-up outpatient in 4 to 6 weeks for fistula duplex (around October 31) - RFP am - CLIP completed  Left hip fracture-after fall while inpatient. patient recovering well from fixation on 9/22. -PT/OT and weightbearing as tolerated -Follow-up with Ortho postop 2 weeks for suture removal and x-rays.  (Around October 7) - [name] consulted for SNF vs CIR placement  HTN- moderately well controlled with low diastolics.  - amiodorone, imdur, metoprolol  T2DM- well controlled with diet - discontinue sSSI  DVT prophylaxis:  apixaban (ELIQUIS) tablet 5 mg   Diet:  Diet Orders (From admission, onward)     Start     Ordered   01/13/21 1510  Diet regular Room service appropriate? Yes with Assist; Fluid consistency: Thin  Diet effective now       Question Answer Comment  Room service appropriate? Yes with Assist   Fluid consistency: Thin      01/13/21 1509            Subjective 01/15/21    Patient has no complaints or concerns today.  Disposition  Plan & Communication  Status is: Inpatient  Remains inpatient appropriate because:Ongoing diagnostic testing needed not appropriate for outpatient work up  Dispo: The patient is from: Home              Anticipated d/c is to: CIR vs SNF              Patient currently is not medically stable to d/c.   Difficult to place patient No  Family Communication: husband at bedside   Consults, Procedures, Significant Events  Consultants:   Nephrology Ortho  Urology  cardiology  Procedures/significant events:  HD Urethral stent Internal hip fixation  Antimicrobials:  Anti-infectives (From admission, onward)    Start     Dose/Rate Route Frequency Ordered Stop   01/10/21 0904  ceFAZolin (ANCEF) 2-4 GM/100ML-% IVPB       Note to Pharmacy: Molly [name]   : cabinet override      01/10/21 0904 01/10/21 2114   01/07/21 0230  ceFAZolin (ANCEF) IVPB 2g/100 mL premix        2 g 200 mL/hr over 30 Minutes Intravenous Every 12 hours 01/06/21 1711 01/07/21 0252   01/06/21 2100  ceFAZolin (ANCEF) IVPB 2g/100 mL premix  Status:  Discontinued        2 g 200 mL/hr over 30 Minutes Intravenous Every 6 hours 01/06/21 1649 01/06/21 1711   01/06/21 1400  ceFAZolin (ANCEF) IVPB 2g/100 mL premix        2 g 200 mL/hr over 30 Minutes Intravenous On call to O.R. 01/05/21 1926 01/06/21 1441   12/31/20 1330  cefTRIAXone (ROCEPHIN) 1 g in sodium chloride 0.9 % 100 mL IVPB        1 g 200 mL/hr over 30 Minutes Intravenous  Once 12/31/20 1315 12/31/20 1350   12/31/20 1000  cefTRIAXone (ROCEPHIN) 1 g in sodium chloride 0.9 % 100 mL IVPB  Status:  Discontinued        1 g 200 mL/hr over 30 Minutes Intravenous Every 24 hours 12/30/20 1202 12/31/20 1315   12/27/20 1530  cefTRIAXone (ROCEPHIN) 2 g in sodium chloride 0.9 % 100 mL IVPB  Status:  Discontinued        2 g 200 mL/hr over 30 Minutes Intravenous Every 24 hours 12/27/20 1435 12/30/20 1202        Objective   Vitals:   01/14/21 0803 01/14/21 2108 01/15/21 0500 01/15/21 0807  BP: (!) 136/51 (!) 131/51  128/67  Pulse: 76 75  71  Resp: '16 16  13  '$ Temp: 98.1 F (36.7 C) 98.7 F (37.1 C)  97.6 F (36.4 C)  TempSrc: Oral Oral  Oral  SpO2: 97% 93%  93%  Weight:   63.7 kg 63.8 kg  Height:        Intake/Output Summary (Last 24 hours) at 01/15/2021 0816 Last data filed at 01/14/2021 2100 Gross per 24 hour  Intake 100 ml  Output --  Net 100 ml    Filed Weights   01/13/21 0751  01/15/21 0500 01/15/21 0807  Weight: 62.8 kg 63.7 kg 63.8 kg    Patient BMI: Body mass index is 25.73 kg/m.   Physical Exam: General: alert, NAD HEENT: atraumatic, clear conjunctiva, anicteric sclera, moist mucus membranes, hearing grossly normal Respiratory:  normal respiratory effort. [place] in place. Speaking in full sentences Cardiovascular: normal S1/S2,  RRR, no JVD, murmurs, rubs, gallops, quick capillary refill  Nervous: Alert. no gross focal neurologic deficits, normal speech Extremities: moves all equally, no edema, normal tone  Skin: dry, intact, normal temperature, normal color, No rashes, lesions or ulcers Psychiatry: depressed mood  Labs   I have personally reviewed following labs and imaging studies CBC    Component Value Date/Time   WBC 9.5 01/15/2021 0332   RBC 3.08 (L) 01/15/2021 0332   HGB 8.4 (L) 01/15/2021 0332   HGB 12.5 08/19/2019 1111   HCT 26.7 (L) 01/15/2021 0332   HCT 39.4 08/19/2019 1111   PLT 313 01/15/2021 0332   PLT 299 08/19/2019 1111   MCV 86.7 01/15/2021 0332   MCV 86 08/19/2019 1111   MCH 27.3 01/15/2021 0332   MCHC 31.5 01/15/2021 0332   RDW 19.0 (H) 01/15/2021 0332   RDW 12.7 08/19/2019 1111   LYMPHSABS 1.3 11/26/2020 1226   MONOABS 0.5 11/26/2020 1226   EOSABS 0.1 11/26/2020 1226   BASOSABS 0.0 11/26/2020 1226   Renal function panel  Imaging Studies  No results found. Medications   Scheduled Meds:  (feeding supplement) PROSource Plus  30 mL Oral TID BM   sodium chloride   Intravenous Once   sodium chloride   Intravenous Once   acetaminophen  1,000 mg Oral TID   amiodarone  200 mg Oral Daily   apixaban  5 mg Oral BID   Chlorhexidine Gluconate Cloth  6 each Topical Q0600   cilostazol  50 mg Oral BID   darbepoetin (ARANESP) injection - DIALYSIS  100 mcg Intravenous Q Sat-HD   diclofenac Sodium  2 g Topical QID   diphenhydrAMINE  25 mg Oral Once   dronabinol  2.5 mg Oral QAC lunch   ezetimibe  10 mg Oral Daily   famotidine  20 mg  Oral Daily   feeding supplement  237 mL Oral TID BM   FLUoxetine  10 mg Oral Daily   isosorbide mononitrate  30 mg Oral Daily   lidocaine  1 patch Transdermal Q24H   metoprolol succinate  50 mg Oral Daily   multivitamin  1 tablet Oral QHS   pantoprazole  40 mg Oral QHS   polyethylene glycol  17 g Oral Daily   rosuvastatin  10 mg Oral Daily   Continuous Infusions:  sodium chloride 10 mL/hr at 01/06/21 1409     LOS: 19 days   Time spent: >47mn   Molly Arterburn L Shamieka Gullo, DO [facility] 01/15/2021, 8:16 AM   To contact the [facility] CenterAttending or Consulting provider for this patient: Check the care team in [facility] Hospitalfor a) attending/consulting [place]provider listed and b) the T[name]team listed Log into [email or web address] and use [place]'s universal password to access. If you do not have the password, please contact the hospital operator. Locate the TThe [facility] Care Centerprovider you are looking for under [facility] and page to a number that you can be directly reached. If you still have difficulty reaching the provider, please page the [name](Director on Call) for the Hospitalists listed on amion for assistance.

## 2021-01-15 NOTE — Progress Notes (Signed)
Oxygen saturation noted to be 87 patient placed on 2L.

## 2021-01-15 NOTE — Progress Notes (Signed)
[facility] Progress Note   70 year old lady with history of hypertension left renal artery stenosis [id number] diastolic dysfunction chronic kidney disease stage IV peripheral artery disease bilateral  superficial femoral artery obstruction.  Anemia, diabetes.  Presented 12/27/2020 with chest pain.  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 Now s/p left ureteral stent placement but with no improvement. -HD started for uremia & failed lasix challenge. Rt fem temp line placed with IR on 9/18.  Hemodialysis started 01/01/2021.    Has a spot at [facility] on TTS schedule to arrive at 10:00 for 10:25 chair time. On pt's first appt, pt will need to arrive at 9:25 in order to complete paperwork prior to first treatment. This will be after she's completed rehab as she likely will be CIR.  Very weak bruit in left upper arm fistula which was thrombectomized intraop (also very pulsatile at the inflow) +  RIJ TC on 01/10/2021.    Next HD planned for Tuesday unless she leaves the hospital before then.  2.Hematuria Left obstructive uropathy-  -in the setting of mildly obstructing 6 mm distal left ureter renal stone seen on CT urogram. S/p left ureteral stent on 9/15. ANCA and lupus serologies wnl. Appreciate urology's assistance   3.Intertrochanteric hip fracture- s/p fall in the hospital.  -ortho on board, preop clearance for repair     4.Epistaxis- overnight 9/14, since resolved. Thought to be due to nasal canula.  Anca serologies neg   5.Left renal artery stenosis- Hx of 80-90% proximal left renal artery stenosis. No intervention   6.Hypertension- BP controlled on metop   7.Afib w/ RVR-on amio. Anticoagulation with eliquis   8.Anemia due to chronic kidney disease-  Hgb decr and will need transfusion with  2 units (1 unit 9/28 and 1 unit on hd 9/29). Will start aranesp 130mg weekly (Sat) w/ dialysis.  Can check iron stores in about a week   9.Diabetes Mellitus Type 2 with Hyperglycemia- -per primary  Subjective:   Denies any complaints this morning.  On dialysis   Objective:   BP (!) 157/63   Pulse 70   Temp 97.6 F (36.4 C) (Oral)   Resp 15   Ht '5\' 2"'$  (1.575 m)   Wt 63.8 kg   SpO2 100%   BMI 25.73 kg/m   Intake/Output Summary (Last 24 hours) at 01/15/2021 1106 Last data filed at 01/14/2021 2100 Gross per 24 hour  Intake 100 ml  Output --  Net 100 ml   Weight change: 0.9 kg  Physical Exam: Gen: Lying in bed, minimally interactive, does not open eyes CVS: Normal rate, no lower extremity edema Resp: Bilateral chest rise with no increased work of breathing [id number] nontender, nondistended Ext:no edema, RIJ TC Neuro: Answers questions appropriately, moving all ext spontaneously Access: Lt BCF weak bruit and very pulsatile at the inflow  Imaging: No results found.  Labs: BMET Recent Labs  Lab 01/09/21 0326 01/10/21 0456 01/11/21 0059 01/12/21 0340 01/13/21 0227 01/14/21 0329 01/15/21 0332  NA 134* 131* 133* 134* 133* 133* 133*  K 3.5 3.4* 3.8 3.4* 3.3* 3.9 3.7  CL 97* 95* 98 100 99 100 99  CO2 '28 24 22 26 25 25 24  '$ GLUCOSE 137* 118* 102* 149* 150* 112* 113*  BUN 25* 34* 42* 18 29* 12 23  CREATININE 3.67* 4.35* 4.93* 2.67*  3.53* 2.03* 2.90*  CALCIUM 8.1* 7.9* 8.0* 7.6* 7.8* 7.8* 7.9*  PHOS 3.8 4.1 5.2* 2.8 3.4 2.1* 2.8   CBC Recent Labs  Lab 01/12/21 0340 01/12/21 1259 01/13/21 0227 01/13/21 1335 01/14/21 0329 01/15/21 0332  WBC 11.8*  --  11.4*  --  10.4 9.5  HGB 6.0*   < > 7.1* 8.5* 8.3* 8.4*  HCT 18.7*   < > 21.7* 26.2* 25.3* 26.7*  MCV 83.9  --  85.4  --  84.3 86.7  PLT 265  --  290  --  270 313   < > = values in this interval not displayed.    Medications:     (feeding supplement) PROSource Plus  30 mL Oral TID BM   sodium chloride    Intravenous Once   sodium chloride   Intravenous Once   acetaminophen  1,000 mg Oral TID   amiodarone  200 mg Oral Daily   apixaban  5 mg Oral BID   Chlorhexidine Gluconate Cloth  6 each Topical Q0600   cilostazol  50 mg Oral BID   darbepoetin (ARANESP) injection - DIALYSIS  100 mcg Intravenous Q Sat-HD   diclofenac Sodium  2 g Topical QID   diphenhydrAMINE  25 mg Oral Once   dronabinol  2.5 mg Oral QAC lunch   ezetimibe  10 mg Oral Daily   famotidine  20 mg Oral Daily   feeding supplement  237 mL Oral TID BM   FLUoxetine  10 mg Oral Daily   isosorbide mononitrate  30 mg Oral Daily   lidocaine  1 patch Transdermal Q24H   metoprolol succinate  50 mg Oral Daily   multivitamin  1 tablet Oral QHS   pantoprazole  40 mg Oral QHS   polyethylene glycol  17 g Oral Daily   rosuvastatin  10 mg Oral Daily      Reesa Chew  01/15/2021, 11:06 AM

## 2021-01-15 NOTE — Progress Notes (Signed)
IP rehab admissions - I met with patient and her husband at the bedside.  I gave them rehab booklets and discussed rehab options.  Patient is not sure she can tolerate CIR.  She and husband will talk about rehab vs SNF vs home with HH.  Will follow up tomorrow or Monday.  If they want to pursue CIR, will need insurance approval prior to rehab admission.  Will also need to have an OT evaluation and recommendations.  Call for questions.  #[phone] 

## 2021-01-15 NOTE — Progress Notes (Signed)
Physical Therapy Treatment Patient Details Name: Molly [name] MRN: [id number] DOB: Apr 26, 1950 Today's Date: 01/15/2021   History of Present Illness This is a 70 y.o. female admitted 9/12 with SOB and chest pain.  HTN crisis and afib with RVR.  Pt had fall while in hospital resulting in left hip fx 9/14. Was placed on bedrest. Pt found to have obstructive uropathy and had a ureteral stent placed9/15.  Hemodialysis initiated 9/17 due to uremia with pt with right femoral temporary HD cath in place.  Pt had fall while in hospital resulting in left hip fx and underwent IM nail left hip 9/22.   9/26 s/p R IJ venin tunneled dialysis cath, L brachiocephalic arteriovenous fistula creation and left cephalic vein thrombectomy. PMH: HTN, CHF, diabetes mellitus type 2,  afib, and CKD    PT Comments    Pt was seen for mobility with RW initially but requires direct assistance to stand with one person.  Her tolerance to stand up is limited by surgery pain but her feeling of discomfort also with proprioceptive loss on L hip.  Will see to focus on strength, control of L hip to WB and standing balance as a result of the first two issues.  Pt is still recommended to SNF for more extended time in rehab with her significant set of symptoms from the hip surgery.   Recommendations for follow up therapy are one component of a multi-disciplinary discharge planning process, led by the attending physician.  Recommendations may be updated based on patient status, additional functional criteria and insurance authorization.  Follow Up Recommendations  CIR;Supervision/Assistance - 24 hour     Equipment Recommendations  None recommended by PT    Recommendations for Other Services Rehab consult     Precautions / Restrictions Precautions Precautions: Fall Restrictions Weight Bearing Restrictions: Yes LLE Weight Bearing: Weight bearing as tolerated     Mobility  Bed Mobility Overal bed mobility: Needs Assistance Bed  Mobility: Rolling;Supine to Sit;Sit to Supine Rolling: Mod assist Sidelying to sit: Mod assist Supine to sit: Mod assist Sit to supine: Mod assist   General bed mobility comments: tolerates movement better when L hip is NWB on bed    Transfers Overall transfer level: Needs assistance Equipment used: Rolling walker (2 wheeled) Transfers: Sit to/from Stand Sit to Stand: Max assist        Lateral/Scoot Transfers: Total assist    Ambulation/Gait             General Gait Details: unable to step   Stairs             Wheelchair Mobility    Modified Rankin (Stroke Patients Only)       Balance Overall balance assessment: Needs assistance Sitting-balance support: Bilateral upper extremity supported;Feet supported Sitting balance-Leahy Scale: Fair Sitting balance - Comments: close S for midline sitting at EOB Postural control: Posterior lean;Right lateral lean Standing balance support: Bilateral upper extremity supported;During functional activity Standing balance-Leahy Scale: Poor                              Cognition Arousal/Alertness: Awake/alert;Lethargic Behavior During Therapy: Flat affect Overall Cognitive Status: Impaired/Different from baseline Area of Impairment: Problem solving;Awareness;Safety/judgement;Following commands                 Orientation Level: Time Current Attention Level: Sustained   Following Commands: Follows one step commands inconsistently;Follows one step commands with increased time Safety/Judgement: Decreased awareness  of safety;Decreased awareness of deficits Awareness: Intellectual Problem Solving: Requires verbal cues;Requires tactile cues General Comments: sleepy but more wakeful after      Exercises      General Comments General comments (skin integrity, edema, etc.): Pt was able to stand with direct assistance but cannot stand with one person on walker.  Unsteady and cannot take a step yet       Pertinent Vitals/Pain Pain Assessment: Faces Faces Pain Scale: Hurts little more Pain Location: left hip Pain Descriptors / Indicators: Grimacing;Guarding Pain Intervention(s): Limited activity within patient's tolerance;Monitored during session;Repositioned    Home Living                      Prior Function            PT Goals (current goals can now be found in the care plan section) Acute Rehab PT Goals Patient Stated Goal: to get back home Progress towards PT goals: Progressing toward goals    Frequency    Min 6X/week      PT Plan Current plan remains appropriate    Co-evaluation              AM-PAC PT "6 Clicks" Mobility   Outcome Measure  Help needed turning from your back to your side while in a flat bed without using bedrails?: A Lot Help needed moving from lying on your back to sitting on the side of a flat bed without using bedrails?: A Lot Help needed moving to and from a bed to a chair (including a wheelchair)?: A Lot Help needed standing up from a chair using your arms (e.g., wheelchair or bedside chair)?: A Lot Help needed to walk in hospital room?: Total Help needed climbing 3-5 steps with a railing? : Total 6 Click Score: 10    End of Session Equipment Utilized During Treatment: Gait belt Activity Tolerance: Patient tolerated treatment well Patient left: with call bell/phone within reach;in bed;with bed alarm set;with family/visitor present Nurse Communication: Mobility status PT Visit Diagnosis: Muscle weakness (generalized) (M62.81);Pain Pain - Right/Left: Left Pain - part of body: Hip     Time: [id number] PT Time Calculation (min) (ACUTE ONLY): 39 min  Charges:  $Therapeutic Activity: 23-37 mins $Neuromuscular Re-education: 8-22 mins           Ramond Dial 01/15/2021, 8:47 PM  Mee Hives, PT MS Acute Rehab Dept. Number: [place] and [place]

## 2021-01-16 LAB — RENAL FUNCTION PANEL
Albumin: 2 g/dL — ABNORMAL LOW (ref 3.5–5.0)
Anion gap: 9 (ref 5–15)
BUN: 9 mg/dL (ref 8–23)
CO2: 26 mmol/L (ref 22–32)
Calcium: 7.7 mg/dL — ABNORMAL LOW (ref 8.9–10.3)
Chloride: 99 mmol/L (ref 98–111)
Creatinine, Ser: 1.87 mg/dL — ABNORMAL HIGH (ref 0.44–1.00)
GFR, Estimated: 29 mL/min — ABNORMAL LOW (ref >60)
Glucose, Bld: 131 mg/dL — ABNORMAL HIGH (ref 70–99)
Phosphorus: 2 mg/dL — ABNORMAL LOW (ref 2.5–4.6)
Potassium: 3.2 mmol/L — ABNORMAL LOW (ref 3.5–5.1)
Sodium: 134 mmol/L — ABNORMAL LOW (ref 135–145)

## 2021-01-16 LAB — CBC
HCT: 25 % — ABNORMAL LOW (ref 36.0–46.0)
Hemoglobin: 8 g/dL — ABNORMAL LOW (ref 12.0–15.0)
MCH: 27.5 pg (ref 26.0–34.0)
MCHC: 32 g/dL (ref 30.0–36.0)
MCV: 85.9 fL (ref 80.0–100.0)
Platelets: 273 10*3/uL (ref 150–400)
RBC: 2.91 MIL/uL — ABNORMAL LOW (ref 3.87–5.11)
RDW: 18.8 % — ABNORMAL HIGH (ref 11.5–15.5)
WBC: 10.5 10*3/uL (ref 4.0–10.5)
nRBC: 0.2 % (ref 0.0–0.2)

## 2021-01-16 LAB — GLUCOSE, CAPILLARY
Glucose-Capillary: 138 mg/dL — ABNORMAL HIGH (ref 70–99)
Glucose-Capillary: 139 mg/dL — ABNORMAL HIGH (ref 70–99)

## 2021-01-16 MED ORDER — POTASSIUM CHLORIDE CRYS ER 20 MEQ PO TBCR
40.0000 meq | EXTENDED_RELEASE_TABLET | Freq: Once | ORAL | Status: AC
  Administered 2021-01-16: 40 meq via ORAL
  Filled 2021-01-16: qty 2

## 2021-01-16 NOTE — Progress Notes (Signed)
[facility] Progress Note   70 year old lady with history of hypertension left renal artery stenosis [id number] diastolic dysfunction chronic kidney disease stage IV peripheral artery disease bilateral  superficial femoral artery obstruction.  Anemia, diabetes.  Presented 12/27/2020 with chest pain.  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 Now s/p left ureteral stent placement but with no improvement. -HD started for uremia & failed lasix challenge. Rt fem temp line placed with IR on 9/18.  Hemodialysis started 01/01/2021.    [facility] schedule to arrive at 10:00 for 10:25 chair time. On pt's first appt, pt will need to arrive at 9:25 in order to complete paperwork prior to first treatment. This will be after she's completed rehab as she likely will be CIR.  Very weak bruit in left upper arm fistula which was thrombectomized intraop (also very pulsatile at the inflow) +  RIJ TC on 01/10/2021.    Next HD planned for Tuesday unless she leaves the hospital before then.  2.Hematuria Left obstructive uropathy-  -in the setting of mildly obstructing 6 mm distal left ureter renal stone seen on CT urogram. S/p left ureteral stent on 9/15. ANCA and lupus serologies wnl. Appreciate urology's assistance   3.Intertrochanteric hip fracture- s/p fall in the hospital.  -ortho on board, preop clearance for repair     4.Epistaxis- overnight 9/14, since resolved. Thought to be due to nasal canula.  Anca serologies neg   5.Left renal artery stenosis- Hx of 80-90% proximal left renal artery stenosis. No intervention   6.Hypertension- BP controlled on metop   7.Afib w/ RVR-on amio. Anticoagulation with eliquis   8.Anemia due to chronic kidney disease-  Hgb decr and will need transfusion with  2 units (1 unit 9/28 and 1 unit on hd 9/29).  Started aranesp 132mg weekly (Sat) w/ dialysis.  Can check iron stores in about a week   9.Diabetes Mellitus Type 2 with Hyperglycemia- -per primary  Subjective:   Patient states she tolerated dialysis yesterday with no issues.  No complaints today   Objective:   BP (!) 115/43 (BP Location: Right Arm)   Pulse 72   Temp 98.3 F (36.8 C) (Oral)   Resp 18   Ht '5\' 2"'$  (1.575 m)   Wt 62.5 kg   SpO2 (!) 18%   BMI 25.20 kg/m   Intake/Output Summary (Last 24 hours) at 01/16/2021 0858 Last data filed at 01/15/2021 1153 Gross per 24 hour  Intake --  Output 1000 ml  Net -1000 ml   Weight change: 0.1 kg  Physical Exam: Gen: Lying in bed, no apparent distress CVS: Normal rate, no lower extremity edema Resp: Bilateral chest rise with no increased work of breathing [id number] nontender, nondistended Ext:no edema, RIJ TC Neuro: Answers questions appropriately, awake and alert Access: Lt BCF weak bruit and very pulsatile at the inflow  Imaging: No results found.  Labs: BMET Recent Labs  Lab 01/10/21 0456 01/11/21 0059 01/12/21 0340 01/13/21 0227 01/14/21 0329 01/15/21 0332 01/16/21 0300  NA 131* 133* 134* 133* 133* 133* 134*  K 3.4* 3.8 3.4* 3.3* 3.9 3.7 3.2*  CL 95* 98 100 99 100 99 99  CO2 '24 22 26 25 25 24 26  '$ GLUCOSE 118* 102* 149* 150* 112* 113* 131*  BUN 34* 42* 18 29* 12 23 9  CREATININE 4.35* 4.93* 2.67* 3.53* 2.03* 2.90* 1.87*  CALCIUM 7.9* 8.0* 7.6* 7.8* 7.8* 7.9* 7.7*  PHOS 4.1 5.2* 2.8 3.4 2.1* 2.8 2.0*   CBC Recent Labs  Lab 01/13/21 0227 01/13/21 1335 01/14/21 0329 01/15/21 0332 01/16/21 0300  WBC 11.4*  --  10.4 9.5 10.5  HGB 7.1* 8.5* 8.3* 8.4* 8.0*  HCT 21.7* 26.2* 25.3* 26.7* 25.0*  MCV 85.4  --  84.3 86.7 85.9  PLT 290  --  270 313 273    Medications:     (feeding supplement) PROSource Plus  30 mL Oral TID BM   sodium chloride   Intravenous Once   sodium chloride   Intravenous Once    acetaminophen  1,000 mg Oral TID   amiodarone  200 mg Oral Daily   apixaban  5 mg Oral BID   Chlorhexidine Gluconate Cloth  6 each Topical Q0600   cilostazol  50 mg Oral BID   darbepoetin (ARANESP) injection - DIALYSIS  100 mcg Intravenous Q Sat-HD   diclofenac Sodium  2 g Topical QID   diphenhydrAMINE  25 mg Oral Once   dronabinol  2.5 mg Oral QAC lunch   ezetimibe  10 mg Oral Daily   famotidine  20 mg Oral Daily   feeding supplement  237 mL Oral TID BM   FLUoxetine  10 mg Oral Daily   isosorbide mononitrate  30 mg Oral Daily   lidocaine  1 patch Transdermal Q24H   metoprolol succinate  50 mg Oral Daily   multivitamin  1 tablet Oral QHS   pantoprazole  40 mg Oral QHS   polyethylene glycol  17 g Oral Daily   rosuvastatin  10 mg Oral Daily      Reesa Chew  01/16/2021, 8:58 AM

## 2021-01-16 NOTE — Progress Notes (Addendum)
PROGRESS NOTE  Molly [name]    DOB: [birth year], 70 y.o.  [id number]  PCP: [name], MD   Code Status: Full Code   DOA: 12/27/2020   LOS: 63  Brief Narrative of Current Hospitalization  Molly [name] is a 70 y.o. female with a PMH significant for HTN, left renal artery stenosis, CHF, CKD stage IV now end-stage, CAD, PAD with bilateral SFA CTO's, anemia of chronic disease, diabetes. They presented from home to the ED on 12/27/2020 with chest pain x 4 days. In the ED, it was found that they had fluid overload and A. fib with RVR. They were treated with BiPAP and medical management per cardiology.  Additionally, during hospitalization, patient had fall with left hip fracture and worsening kidney function transitioned her to start hemodialysis this admission. Patient was admitted to medicine service for further workup and management of complex care of respiratory failure, end-stage kidney, hip fracture as outlined in detail below.  Palliative care, vascular surgery, nephrology, cardiology, Ortho also consulted this admission.  01/16/21 -stable  Assessment & Plan  Principal Problem:   Acute on chronic respiratory failure with hypoxia and hypercapnia (HCC) Active Problems:   HTN (hypertension)   Goals of care, counseling/discussion   Type 2 diabetes mellitus with stage 3b chronic kidney disease, with long-term current use of insulin (HCC)   Elevated troponin   Acute on chronic heart failure with preserved ejection fraction (HFpEF) ([place])   CAP (community acquired pneumonia)   Anemia   Abnormal CT of the abdomen   Acute postoperative pain of left hip   Fall   Anemia due to end stage renal disease ([place])   Hematuria  Acute on chronic respiratory failure with hypoxia and hypercapnia-on room air. Patient is severely deconditioned with acute illnesses but seems much more alert and conversational today.  - continue appetite stimulant, per patient and husbands request with limited  choices due to comorbidities.  - nutrition consulted and liberalized diet - add osmolite to diet - CIR is recommended but patient and her husband (ray) will discuss if they plan to pursue this option or not  Acute symptomatic anemia- hgb mild decrease today to 8.0 -CBC am, transfusion threshold 7.  Hematuria 2/2 obstructing ureteral stone on left. Asymptomatic. Left ureteral stent placed 9/15. - f/u urology  A. fib with RVR-rate controlled - 200 mg for 3 weeks (end date 10/11) -Cardiology signed off -Continue Eliquis  ESRD- now on HD T,Th,S schedule. K mildly depleted today.  - 28mq Kdur given x1 -Vascular following - Patient will need to follow-up outpatient in 4 to 6 weeks for fistula duplex (around October 31) - RFP am - CLIP completed  Left hip fracture-after fall while inpatient. patient recovering well from fixation on 9/22. -PT/OT and weightbearing as tolerated -Follow-up with Ortho postop 2 weeks for suture removal and x-rays.  (Around October 7) [facility]  HTN  HFrEF- moderately well controlled with low diastolics.  - amiodorone, imdur, metoprolol  T2DM- well controlled with diet - discontinue sSSI  DVT prophylaxis:  apixaban (ELIQUIS) tablet 5 mg   Diet:  Diet Orders (From admission, onward)     Start     Ordered   01/13/21 1510  Diet regular Room service appropriate? Yes with Assist; Fluid consistency: Thin  Diet effective now       Question Answer Comment  Room service appropriate? Yes with Assist   Fluid consistency: Thin      01/13/21  1509            Subjective 01/16/21    Patient has no complaints or concerns today.  Disposition Plan & Communication  Status is: Inpatient  Remains inpatient appropriate because:Ongoing diagnostic testing needed not appropriate for outpatient work up  Dispo: The patient is from: Home              Anticipated d/c is to: CIR vs SNF              Patient currently is not  medically stable to d/c.   Difficult to place patient No  Family Communication: husband at bedside   Consults, Procedures, Significant Events  Consultants:  Nephrology Ortho  Urology  cardiology  Procedures/significant events:  HD Urethral stent Internal hip fixation  Antimicrobials:  Anti-infectives (From admission, onward)    Start     Dose/Rate Route Frequency Ordered Stop   01/10/21 0904  ceFAZolin (ANCEF) 2-4 GM/100ML-% IVPB       Note to Pharmacy: Odelia Gage   : cabinet override      01/10/21 0904 01/10/21 2114   01/07/21 0230  ceFAZolin (ANCEF) IVPB 2g/100 mL premix        2 g 200 mL/hr over 30 Minutes Intravenous Every 12 hours 01/06/21 1711 01/07/21 0252   01/06/21 2100  ceFAZolin (ANCEF) IVPB 2g/100 mL premix  Status:  Discontinued        2 g 200 mL/hr over 30 Minutes Intravenous Every 6 hours 01/06/21 1649 01/06/21 1711   01/06/21 1400  ceFAZolin (ANCEF) IVPB 2g/100 mL premix        2 g 200 mL/hr over 30 Minutes Intravenous On call to O.R. 01/05/21 1926 01/06/21 1441   12/31/20 1330  cefTRIAXone (ROCEPHIN) 1 g in sodium chloride 0.9 % 100 mL IVPB        1 g 200 mL/hr over 30 Minutes Intravenous  Once 12/31/20 1315 12/31/20 1350   12/31/20 1000  cefTRIAXone (ROCEPHIN) 1 g in sodium chloride 0.9 % 100 mL IVPB  Status:  Discontinued        1 g 200 mL/hr over 30 Minutes Intravenous Every 24 hours 12/30/20 1202 12/31/20 1315   12/27/20 1530  cefTRIAXone (ROCEPHIN) 2 g in sodium chloride 0.9 % 100 mL IVPB  Status:  Discontinued        2 g 200 mL/hr over 30 Minutes Intravenous Every 24 hours 12/27/20 1435 12/30/20 1202        Objective   Vitals:   01/15/21 1927 01/16/21 0500 01/16/21 0534 01/16/21 0537  BP: (!) 134/44   (!) 113/39  Pulse: 77  72 71  Resp: 18  18   Temp: 98.1 F (36.7 C)  97.9 F (36.6 C)   TempSrc: Oral  Oral   SpO2: 94%  100% 100%  Weight:  62.5 kg    Height:        Intake/Output Summary (Last 24 hours) at 01/16/2021 0814 Last  data filed at 01/15/2021 1153 Gross per 24 hour  Intake --  Output 1000 ml  Net -1000 ml    Filed Weights   01/15/21 0807 01/15/21 1153 01/16/21 0500  Weight: 63.8 kg 62 kg 62.5 kg    Patient BMI: Body mass index is 25.2 kg/m.   Physical Exam: General: alert, NAD HEENT: atraumatic, clear conjunctiva, anicteric sclera, moist mucus membranes, hearing grossly normal Respiratory:  normal respiratory effort. Healy in place. Speaking in full sentences Cardiovascular: normal S1/S2,  RRR, no JVD, murmurs, rubs,  gallops, quick capillary refill  Nervous: Alert. no gross focal neurologic deficits, normal speech Extremities: moves all equally, no edema, normal tone Skin: dry, intact, normal temperature, normal color, No rashes, lesions or ulcers Psychiatry: depressed mood  Labs   I have personally reviewed following labs and imaging studies CBC    Component Value Date/Time   WBC 10.5 01/16/2021 0300   RBC 2.91 (L) 01/16/2021 0300   HGB 8.0 (L) 01/16/2021 0300   HGB 12.5 08/19/2019 1111   HCT 25.0 (L) 01/16/2021 0300   HCT 39.4 08/19/2019 1111   PLT 273 01/16/2021 0300   PLT 299 08/19/2019 1111   MCV 85.9 01/16/2021 0300   MCV 86 08/19/2019 1111   MCH 27.5 01/16/2021 0300   MCHC 32.0 01/16/2021 0300   RDW 18.8 (H) 01/16/2021 0300   RDW 12.7 08/19/2019 1111   LYMPHSABS 1.3 11/26/2020 1226   MONOABS 0.5 11/26/2020 1226   EOSABS 0.1 11/26/2020 1226   BASOSABS 0.0 11/26/2020 1226   Renal function panel  Imaging Studies  No results found. Medications   Scheduled Meds:  (feeding supplement) PROSource Plus  30 mL Oral TID BM   sodium chloride   Intravenous Once   sodium chloride   Intravenous Once   acetaminophen  1,000 mg Oral TID   amiodarone  200 mg Oral Daily   apixaban  5 mg Oral BID   Chlorhexidine Gluconate Cloth  6 each Topical Q0600   cilostazol  50 mg Oral BID   darbepoetin (ARANESP) injection - DIALYSIS  100 mcg Intravenous Q Sat-HD   diclofenac Sodium  2 g Topical  QID   diphenhydrAMINE  25 mg Oral Once   dronabinol  2.5 mg Oral QAC lunch   ezetimibe  10 mg Oral Daily   famotidine  20 mg Oral Daily   feeding supplement  237 mL Oral TID BM   FLUoxetine  10 mg Oral Daily   isosorbide mononitrate  30 mg Oral Daily   lidocaine  1 patch Transdermal Q24H   metoprolol succinate  50 mg Oral Daily   multivitamin  1 tablet Oral QHS   pantoprazole  40 mg Oral QHS   polyethylene glycol  17 g Oral Daily   rosuvastatin  10 mg Oral Daily   Continuous Infusions:  sodium chloride 10 mL/hr at 01/06/21 1409     LOS: 20 days   Time spent: >77mn   Domanique Huesman L Marquite Attwood, DO [facility] 01/16/2021, 8:14 AM   To contact the TMount Desert Island HospitalAttending or Consulting provider for this patient: Check the care team in CWills [facility] Hospitalfor a) attending/consulting TFairview-Ferndaleprovider listed and b) the [facility] Centerteam listed Log into [email or web address] and use [place]'s universal password to access. If you do not have the password, please contact the hospital operator. Locate the TS[facility] St. Clare Hospitalprovider you are looking for under [facility] and page to a number that you can be directly reached. If you still have difficulty reaching the provider, please page the [facility](Director on Call) for the Hospitalists listed on amion for assistance.

## 2021-01-17 DIAGNOSIS — W19XXXD Unspecified fall, subsequent encounter: Secondary | ICD-10-CM

## 2021-01-17 DIAGNOSIS — Z8781 Personal history of (healed) traumatic fracture: Secondary | ICD-10-CM

## 2021-01-17 LAB — RENAL FUNCTION PANEL
Albumin: 2.1 g/dL — ABNORMAL LOW (ref 3.5–5.0)
Anion gap: 8 (ref 5–15)
BUN: 22 mg/dL (ref 8–23)
CO2: 25 mmol/L (ref 22–32)
Calcium: 7.7 mg/dL — ABNORMAL LOW (ref 8.9–10.3)
Chloride: 99 mmol/L (ref 98–111)
Creatinine, Ser: 2.78 mg/dL — ABNORMAL HIGH (ref 0.44–1.00)
GFR, Estimated: 18 mL/min — ABNORMAL LOW (ref >60)
Glucose, Bld: 122 mg/dL — ABNORMAL HIGH (ref 70–99)
Phosphorus: 2.4 mg/dL — ABNORMAL LOW (ref 2.5–4.6)
Potassium: 3.7 mmol/L (ref 3.5–5.1)
Sodium: 132 mmol/L — ABNORMAL LOW (ref 135–145)

## 2021-01-17 LAB — CBC
HCT: 25.9 % — ABNORMAL LOW (ref 36.0–46.0)
Hemoglobin: 8.2 g/dL — ABNORMAL LOW (ref 12.0–15.0)
MCH: 27.4 pg (ref 26.0–34.0)
MCHC: 31.7 g/dL (ref 30.0–36.0)
MCV: 86.6 fL (ref 80.0–100.0)
Platelets: 308 10*3/uL (ref 150–400)
RBC: 2.99 MIL/uL — ABNORMAL LOW (ref 3.87–5.11)
RDW: 19.1 % — ABNORMAL HIGH (ref 11.5–15.5)
WBC: 9.8 10*3/uL (ref 4.0–10.5)
nRBC: 0 % (ref 0.0–0.2)

## 2021-01-17 LAB — GLUCOSE, CAPILLARY
Glucose-Capillary: 109 mg/dL — ABNORMAL HIGH (ref 70–99)
Glucose-Capillary: 121 mg/dL — ABNORMAL HIGH (ref 70–99)
Glucose-Capillary: 143 mg/dL — ABNORMAL HIGH (ref 70–99)

## 2021-01-17 MED ORDER — DRONABINOL 2.5 MG PO CAPS: 2.5000 mg | ORAL_CAPSULE | Freq: Every day | ORAL | 0 refills | Status: AC

## 2021-01-17 MED ORDER — APIXABAN 5 MG PO TABS: 5.0000 mg | ORAL_TABLET | Freq: Two times a day (BID) | ORAL | 0 refills | Status: DC

## 2021-01-17 MED ORDER — METOPROLOL SUCCINATE ER 50 MG PO TB24: 50.0000 mg | ORAL_TABLET | Freq: Every day | ORAL | 0 refills | Status: DC

## 2021-01-17 MED ORDER — ACETAMINOPHEN 325 MG PO TABS: 650.0000 mg | ORAL_TABLET | ORAL | Status: AC | PRN

## 2021-01-17 MED ORDER — FLUOXETINE HCL 10 MG PO CAPS: 10.0000 mg | ORAL_CAPSULE | Freq: Every day | ORAL | 0 refills | Status: AC

## 2021-01-17 MED ORDER — AMIODARONE HCL 200 MG PO TABS: 200.0000 mg | ORAL_TABLET | Freq: Every day | ORAL | 0 refills | Status: DC

## 2021-01-17 MED ORDER — ROSUVASTATIN CALCIUM 10 MG PO TABS: 10.0000 mg | ORAL_TABLET | Freq: Every day | ORAL | 0 refills | Status: AC

## 2021-01-17 MED ORDER — ISOSORBIDE MONONITRATE ER 30 MG PO TB24: 30.0000 mg | ORAL_TABLET | Freq: Every day | ORAL | 0 refills | Status: DC

## 2021-01-17 NOTE — Progress Notes (Signed)
Occupational Therapy Treatment Patient Details Name: Molly [name] MRN: [id number] DOB: March 12, 1951 Today's Date: 01/17/2021   History of present illness This is a 70 y.o. female admitted 9/12 with SOB and chest pain.  HTN crisis and afib with RVR.  Pt had fall while in hospital resulting in left hip fx 9/14. Was placed on bedrest. Pt found to have obstructive uropathy and had a ureteral stent placed9/15.  Hemodialysis initiated 9/17 due to uremia with pt with right femoral temporary HD cath in place.  Pt had fall while in hospital resulting in left hip fx and underwent IM nail left hip 9/22.   9/26 s/p R IJ venin tunneled dialysis cath, L brachiocephalic arteriovenous fistula creation and left cephalic vein thrombectomy. PMH: HTN, CHF, diabetes mellitus type 2,  afib, and CKD   OT comments  Pt supine in bed and agreeable to OT/PT session. Completing bed mobility with mod assist, sit to stand from EOB with max assist +2 with cueing for hand placement and technique.  Pt requires total assist for toileting (incontinence of bowel in standing), max assist +2 for LB ADLs. Requires cueing throughout session for problem solving, sequencing and safety, decreased recall throughout session. Increased time and fatigues easily. Continue to recommend SNF at this time (as CIR has signed off), but if pt dcs home will need 24/7 assist and maximal HH services.    Recommendations for follow up therapy are one component of a multi-disciplinary discharge planning process, led by the attending physician.  Recommendations may be updated based on patient status, additional functional criteria and insurance authorization.    Follow Up Recommendations  SNF;Supervision/Assistance - 24 hour;Home health OT (HHOT and max HH services if pt declines SNF)    Equipment Recommendations  Other (comment) (TBD)    Recommendations for Other Services      Precautions / Restrictions Precautions Precautions: Fall Restrictions Weight  Bearing Restrictions: Yes LLE Weight Bearing: Weight bearing as tolerated       Mobility Bed Mobility Overal bed mobility: Needs Assistance Bed Mobility: Supine to Sit;Sit to Sidelying;Rolling Rolling: Min assist   Supine to sit: Mod assist;HOB elevated   Sit to sidelying: Mod assist General bed mobility comments: pt transitioned to EOB with support for trunk, returned to supine with assist for BLEs to supine    Transfers Overall transfer level: Needs assistance Equipment used: Rolling walker (2 wheeled) Transfers: Sit to/from Stand Sit to Stand: Max assist;+2 physical assistance;+2 safety/equipment         General transfer comment: max assist +2 to power up and steady from EOB with cueing for hand placement, use of momentum, and safety.    Balance Overall balance assessment: Needs assistance Sitting-balance support: No upper extremity supported;Feet supported Sitting balance-Leahy Scale: Fair     Standing balance support: Bilateral upper extremity supported;During functional activity Standing balance-Leahy Scale: Poor Standing balance comment: relies on BUE and external support                           ADL either performed or assessed with clinical judgement   ADL Overall ADL's : Needs assistance/impaired                 Upper Body Dressing : Set up;Sitting   Lower Body Dressing: Maximal assistance;+2 for physical assistance;+2 for safety/equipment;Sit to/from stand Lower Body Dressing Details (indicate cue type and reason): able to adjust socks with increased time, max assist +2 in standing  Toilet Transfer Details (indicate cue type and reason): deferred Toileting- Clothing Manipulation and Hygiene: Total assistance;+2 for physical assistance;+2 for safety/equipment;Sit to/from stand Toileting - Clothing Manipulation Details (indicate cue type and reason): total assist in standing due to incontience of bowel at EOB     Functional mobility  during ADLs: +2 for physical assistance;+2 for safety/equipment;Maximal assistance;Rolling walker;Cueing for safety;Cueing for sequencing       Vision   Additional Comments: appears [facility]   Perception     Praxis      Cognition Arousal/Alertness: Awake/alert Behavior During Therapy: Flat affect Overall Cognitive Status: Impaired/Different from baseline Area of Impairment: Problem solving;Awareness;Safety/judgement;Following commands                       Following Commands: Follows one step commands consistently;Follows one step commands with increased time;Follows multi-step commands inconsistently Safety/Judgement: Decreased awareness of safety;Decreased awareness of deficits Awareness: Emergent Problem Solving: Slow processing;Decreased initiation;Difficulty sequencing;Requires verbal cues General Comments: patient oriented and following simple commands, improving awareness but requires cueing for safety throughout session        Exercises     Shoulder Instructions       General Comments      Pertinent Vitals/ Pain       Pain Assessment: Faces Faces Pain Scale: Hurts a little bit Pain Location: left hip Pain Descriptors / Indicators: Other (Comment) (stiff) Pain Intervention(s): Limited activity within patient's tolerance;Monitored during session;Repositioned  Home Living                                          Prior Functioning/Environment              Frequency  Min 2X/week        Progress Toward Goals  OT Goals(current goals can now be found in the care plan section)  Progress towards OT goals: Progressing toward goals  Acute Rehab OT Goals Patient Stated Goal: to get back home OT Goal Formulation: With patient  Plan Frequency remains appropriate;Discharge plan needs to be updated    Co-evaluation    PT/OT/SLP Co-Evaluation/Treatment: Yes Reason for Co-Treatment: For patient/therapist safety;To address  functional/ADL transfers   OT goals addressed during session: ADL's and self-care      AM-PAC OT "6 Clicks" Daily Activity     Outcome Measure   Help from another person eating meals?: A Little Help from another person taking care of personal grooming?: A Little Help from another person toileting, which includes using toliet, bedpan, or urinal?: Total Help from another person bathing (including washing, rinsing, drying)?: A Lot Help from another person to put on and taking off regular upper body clothing?: A Little Help from another person to put on and taking off regular lower body clothing?: A Lot 6 Click Score: 14    End of Session Equipment Utilized During Treatment: Rolling walker;Gait belt  OT Visit Diagnosis: Other abnormalities of gait and mobility (R26.89);Muscle weakness (generalized) (M62.81);Pain;Other symptoms and signs involving cognitive function;History of falling (Z91.81) Pain - Right/Left: Left Pain - part of body: Leg   Activity Tolerance Patient tolerated treatment well   Patient Left in bed;with call bell/phone within reach;with bed alarm set   Nurse Communication Mobility status;Need for lift equipment        Time: [phone] OT Time Calculation (min): 30 min  Charges: OT General Charges $OT Visit: 1 Visit OT  Treatments $Self Care/Home Management : 8-22 mins  Jolaine Artist, OT Acute Rehabilitation Services Pager [phone] Office [place] 01/17/2021, 1:45 PM

## 2021-01-17 NOTE — Progress Notes (Signed)
Nurse had orders to removal staples from pt. Hip. Spoke with Ortho MD, who state it is too soon to remove staples. Pt. Needs to follow up this week or next in his office for removal.

## 2021-01-17 NOTE — Discharge Summary (Signed)
Physician Discharge Summary  Molly [name] [id number] DOB: Jan 28, 1951 DOA: 12/27/2020  PCP: [name], MD  Admit date: 12/27/2020 Discharge date: 01/17/2021  Admitted From: home Disposition:  home  Recommendations for Outpatient Follow-up:  Follow up with PCP within 1 week to  Outpatient HD starts 10/4 monitor electrolytes and anemia Follow up with ortho for staple removal on hip fixation Follow up with vascular for   Home Health:PT, nurse Equipment/Devices:wheelchair  Discharge Condition:stable CODE STATUS:full Diet recommendation: regular  Brief/Interim Summary: Pt admitted with fluid overload and acute kidney failure and heart failure exacerbation which ultimately led to initiation of hemodialysis and very gradual improvement of her respiratory status back to baseline. Her stay was complicated by a fall resulting in left hip fracture and fixation. She required several blood transfusions throughout her stay but remained stable several days prior to discharge. It was recommended that she go to CIR but she declined and instead decided to go home with [facility] PT.  Discharge Diagnoses:  Principal Problem:   Acute on chronic respiratory failure with hypoxia and hypercapnia (HCC) Active Problems:   HTN (hypertension)   Goals of care, counseling/discussion   Type 2 diabetes mellitus with stage 3b chronic kidney disease, with long-term current use of insulin (HCC)   Elevated troponin   Acute on chronic heart failure with preserved ejection fraction (HFpEF) ([place])   CAP (community acquired pneumonia)   Anemia   Abnormal CT of the abdomen   Acute postoperative pain of left hip   Fall   Anemia due to end stage renal disease ([place])   Hematuria   S/p left hip fracture    Allergies as of 01/17/2021       Reactions   Lisinopril Other (See Comments)   Mercury Nausea And Vomiting        Medication List     STOP taking these medications    amLODipine 10 MG  tablet Commonly known as: NORVASC   coconut oil Oil   Flintstones Complete 18 MG Chew   furosemide 40 MG tablet Commonly known as: Lasix   hydrALAZINE 25 MG tablet Commonly known as: APRESOLINE   insulin isophane & regular human KwikPen (70-30) 100 UNIT/ML KwikPen Commonly known as: HUMULIN 70/30 MIX   triamcinolone cream 0.1 % Commonly known as: KENALOG       TAKE these medications    acetaminophen 325 MG tablet Commonly known as: TYLENOL Take 2 tablets (650 mg total) by mouth every 4 (four) hours as needed for headache or mild pain.   amiodarone 200 MG tablet Commonly known as: PACERONE Take 1 tablet (200 mg total) by mouth daily for 7 days. Start taking on: January 18, 2021   apixaban 5 MG Tabs tablet Commonly known as: ELIQUIS Take 1 tablet (5 mg total) by mouth 2 (two) times daily.   Cholecalciferol 25 MCG (1000 UT) tablet Take 2,000 Units by mouth daily.   cilostazol 50 MG tablet Commonly known as: PLETAL Take 1 tablet (50 mg total) by mouth 2 (two) times daily.   dronabinol 2.5 MG capsule Commonly known as: MARINOL Take 1 capsule (2.5 mg total) by mouth daily before lunch.   ezetimibe 10 MG tablet Commonly known as: ZETIA Take 1 tablet (10 mg total) by mouth daily.   famotidine 20 MG tablet Commonly known as: PEPCID Take 20 mg by mouth in the morning and at bedtime.   FLUoxetine 10 MG capsule Commonly known as: PROZAC Take 1 capsule (10 mg total) by mouth daily.  Start taking on: January 18, 2021   hydrocortisone cream 1 % Apply 1 application topically 4 (four) times daily as needed for itching.   isosorbide mononitrate 30 MG 24 hr tablet Commonly known as: IMDUR Take 1 tablet (30 mg total) by mouth daily. Start taking on: January 18, 2021   ketoconazole 2 % cream Commonly known as: NIZORAL Apply 1 application topically daily as needed for irritation.   levocetirizine 5 MG tablet Commonly known as: XYZAL Take 5 mg by mouth daily as needed  for allergies.   loperamide 2 MG tablet Commonly known as: IMODIUM A-D Take 2 mg by mouth 4 (four) times daily as needed for diarrhea or loose stools.   metoprolol succinate 50 MG 24 hr tablet Commonly known as: TOPROL-XL Take 1 tablet (50 mg total) by mouth daily. Take with or immediately following a meal. Start taking on: January 18, 2021   rosuvastatin 10 MG tablet Commonly known as: CRESTOR Take 1 tablet (10 mg total) by mouth daily. Start taking on: January 18, 2021 What changed:  medication strength See the new instructions.               Durable Medical Equipment  (From admission, onward)           Start     Ordered   01/17/21 1340  For home use only DME lightweight manual wheelchair with seat cushion  Once       Comments: Patient suffers from  acute on chronic respiratory failure with hypoxia and hypercapnia, weakness which impairs their ability to perform daily activities like bathing in the home.  A walker will not resolve  issue with performing activities of daily living. A wheelchair will allow patient to safely perform daily activities. Patient is not able to propel themselves in the home using a standard weight wheelchair due to general weakness. Patient can self propel in the lightweight wheelchair. Length of need Lifetime. Accessories: elevating leg rests (ELRs), wheel locks, extensions and anti-tippers.   01/17/21 1340            Follow-up Information     [facility] Follow up.   Contact information: [place] [phone]        [facility] Follow up.   Why: 4-6 weeks. The office will call the patient with an appointment(sent) Contact information: [address] [place], [facility] Follow up.   Specialty: [place] Why: home health services will be provided by [facility], start of  care within 48 hours post discharge Contact information: [address] [place] [address] [phone]         [facility] Follow up on 01/19/2021.   Why: 10:30  am with Dr. Jacqulyn Cane information: [address] [address] [phone]               Allergies  Allergen Reactions   Lisinopril Other (See Comments)   Mercury Nausea And Vomiting    Consultations: Nephrology Cardiology Ortho  Urology Vascular surgery GI  Procedures/Studies: DG Chest 2 View  Result Date: 12/27/2020 CLINICAL DATA:  70 year old female with chest pain. EXAM: CHEST - 2 VIEW COMPARISON:  Chest radiographs 12/07/2020 and earlier. FINDINGS: Improved lung volumes. Mediastinal contours are stable with heart size at the upper limits of normal. Calcified aortic  atherosclerosis. Visualized tracheal air column is within normal limits. Diffuse increased pulmonary interstitial opacity, hazy and confluent at the right lung base. No pleural effusion or pneumothorax. No consolidation. Gas-filled bowel loops in the upper abdomen have increased, nonspecific. Stable cholecystectomy clips. No acute osseous abnormality identified. IMPRESSION: Increased and asymmetric pulmonary interstitial opacity since last month, confluent at the right lung base. No pleural effusion or consolidation. Top differential considerations include asymmetric pulmonary edema and viral/atypical respiratory infection Electronically Signed   By: Genevie Ann M.D.   On: 12/27/2020 07:26   DG Retrograde Pyelogram  Result Date: 12/31/2020 CLINICAL DATA:  Ureteral calculus EXAM: RETROGRADE PYELOGRAM COMPARISON:  CT 12/29/2020 FINDINGS: Single fluoroscopic spot image documents partial opacification of the renal collecting system and presence of ureteral stent. Laterality is not marked. IMPRESSION: Ureteral stent placement Electronically Signed   By: Lucrezia Europe M.D.   On: 12/31/2020 09:56    US RENAL  Result Date: 12/29/2020 CLINICAL DATA:  Hematuria. EXAM: RENAL / URINARY TRACT ULTRASOUND COMPLETE COMPARISON:  November 27, 2020. FINDINGS: Right Kidney: Renal measurements: 10.7 x 5.4 x 5.0 cm = volume: 150 mL. Echogenicity within normal limits. No mass or hydronephrosis visualized. Left Kidney: Renal measurements: 10.3 x 4.9 x 4.5 cm = volume: 112 mL. Echogenicity within normal limits. No mass visualized. Mild left hydronephrosis is noted. Bladder: Appears normal for degree of bladder distention. Ureteral jets were not visualized. Other: None. IMPRESSION: Mild left hydronephrosis. CT urogram may be performed to evaluate for possible cause of obstruction. Electronically Signed   By: Marijo Conception M.D.   On: 12/29/2020 10:23   CT HIP LEFT WO CONTRAST  Result Date: 12/31/2020 CLINICAL DATA:  Acute limping.  Left hip pain. EXAM: CT OF THE LEFT HIP WITHOUT CONTRAST TECHNIQUE: Multidetector CT imaging of the left hip was performed according to the standard protocol. Multiplanar CT image reconstructions were also generated. COMPARISON:  Radiographs 12/29/2020 FINDINGS: Bones/Joint/Cartilage Acute intertrochanteric fracture of the left hip, not readily appreciable on the recent CT pelvis from 12/29/2020. Minimal comminution along the greater tuberosity portion. No other regional pelvic fracture is observed. Ligaments Suboptimally assessed by CT. Muscles and Tendons Unremarkable Soft tissues Substantial atherosclerotic vascular calcifications. Contrast medium in the urinary bladder and left distal ureter. Small amount of pelvic ascites adjacent to the uterus. Diffuse subcutaneous edema. IMPRESSION: 1. Acute left hip intertrochanteric fracture with minimal comminution along the greater trochanter. 2. Diffuse subcutaneous edema.  Small amount of ascites. 3. Atherosclerosis. Electronically Signed   By: Van Clines M.D.   On: 12/31/2020 15:20   IR Fluoro Guide CV Line Right  Result Date:  01/01/2021 INDICATION: 70 year old with acute renal failure. Patient needs a non tunneled dialysis catheter. EXAM: FLUOROSCOPIC AND ULTRASOUND GUIDED PLACEMENT OF A NON-TUNNELED DIALYSIS CATHETER Physician: Stephan Minister. Anselm Pancoast, MD MEDICATIONS: Moderate sedation ANESTHESIA/SEDATION: Versed 0.5 mg IV; Fentanyl 25 mcg IV; Moderate Sedation Time:  10 minutes The patient was continuously monitored during the procedure by the interventional radiology nurse under my direct supervision. FLUOROSCOPY TIME:  Fluoroscopy Time: 6 seconds, 1 mGy COMPLICATIONS: None immediate. PROCEDURE: Informed consent was obtained for catheter placement. The patient was placed supine on the interventional table. Ultrasound confirmed a patent right common femoral vein. Ultrasound images were obtained for documentation. The right groin was prepped and draped in a sterile fashion. The right groin was anesthetized with 1% lidocaine. Maximal barrier sterile technique was utilized including caps, mask, sterile gowns, sterile gloves, sterile drape, hand hygiene and skin antiseptic. A small incision  was made with #11 blade scalpel. A 21 gauge needle directed into the right common femoral vein with ultrasound guidance. A micropuncture dilator set was placed. A 24 cm Mahurkar catheter was selected. The catheter was advanced over a wire and positioned in the lower IVC region. Fluoroscopic images were obtained for documentation. Both dialysis lumens were found to aspirate and flush well. The proper amount of heparin was flushed in both lumens. The central venous lumen was flushed with normal saline. Catheter was sutured to skin. FINDINGS: Catheter tip in the lower IVC region. IMPRESSION: Successful placement of a right femoral non-tunneled dialysis catheter using ultrasound and fluoroscopic guidance. Electronically Signed   By: Markus Daft M.D.   On: 01/01/2021 15:30   IR US Guide Vasc Access Right  Result Date: 01/01/2021 INDICATION: 70 year old with acute  renal failure. Patient needs a non tunneled dialysis catheter. EXAM: FLUOROSCOPIC AND ULTRASOUND GUIDED PLACEMENT OF A NON-TUNNELED DIALYSIS CATHETER Physician: Stephan Minister. Anselm Pancoast, MD MEDICATIONS: Moderate sedation ANESTHESIA/SEDATION: Versed 0.5 mg IV; Fentanyl 25 mcg IV; Moderate Sedation Time:  10 minutes The patient was continuously monitored during the procedure by the interventional radiology nurse under my direct supervision. FLUOROSCOPY TIME:  Fluoroscopy Time: 6 seconds, 1 mGy COMPLICATIONS: None immediate. PROCEDURE: Informed consent was obtained for catheter placement. The patient was placed supine on the interventional table. Ultrasound confirmed a patent right common femoral vein. Ultrasound images were obtained for documentation. The right groin was prepped and draped in a sterile fashion. The right groin was anesthetized with 1% lidocaine. Maximal barrier sterile technique was utilized including caps, mask, sterile gowns, sterile gloves, sterile drape, hand hygiene and skin antiseptic. A small incision was made with #11 blade scalpel. A 21 gauge needle directed into the right common femoral vein with ultrasound guidance. A micropuncture dilator set was placed. A 24 cm Mahurkar catheter was selected. The catheter was advanced over a wire and positioned in the lower IVC region. Fluoroscopic images were obtained for documentation. Both dialysis lumens were found to aspirate and flush well. The proper amount of heparin was flushed in both lumens. The central venous lumen was flushed with normal saline. Catheter was sutured to skin. FINDINGS: Catheter tip in the lower IVC region. IMPRESSION: Successful placement of a right femoral non-tunneled dialysis catheter using ultrasound and fluoroscopic guidance. Electronically Signed   By: Markus Daft M.D.   On: 01/01/2021 15:30   DG CHEST PORT 1 VIEW  Result Date: 01/10/2021 CLINICAL DATA:  Tunnel dialysis EXAM: PORTABLE CHEST 1 VIEW COMPARISON:  03/28/2021  FINDINGS: Large-bore dialysis catheter with tip in the RIGHT atrium. Low lung volumes. Mild central venous congestion which is improved from comparison exam. Mild LEFT basilar atelectasis. No pneumothorax IMPRESSION:.  NO PNEUMOTHORAX: IMPRESSION:.  NO PNEUMOTHORAX 1. Interval placement dialysis catheter without complication. 2. Improvement venous congestion. Electronically Signed   By: Suzy Bouchard M.D.   On: 01/10/2021 14:31   DG Fluoro Guide CV Line-No Report  Result Date: 01/10/2021 Fluoroscopy was utilized by the requesting physician.  No radiographic interpretation.   DG C-Arm 1-60 Min-No Report  Result Date: 01/06/2021 Fluoroscopy was utilized by the requesting physician.  No radiographic interpretation.   DG C-Arm 1-60 Min-No Report  Result Date: 12/30/2020 Fluoroscopy was utilized by the requesting physician.  No radiographic interpretation.   CT RENAL STONE STUDY  Result Date: 12/29/2020 CLINICAL DATA:  Hydronephrosis. EXAM: CT ABDOMEN AND PELVIS WITHOUT CONTRAST TECHNIQUE: Multidetector CT imaging of the abdomen and pelvis was performed following the standard protocol  without IV contrast. COMPARISON:  Ultrasound renal 12/29/2020. CT abdomen and pelvis 06/21/2015. FINDINGS: Lower chest: There are patchy ground-glass and atelectatic changes in both lung bases, right greater than left. There also airspace opacities in the right lower lobe. Heart is enlarged. Hepatobiliary: No focal liver abnormality is seen. Status post cholecystectomy. No biliary dilatation. Pancreas: Unremarkable. No pancreatic ductal dilatation or surrounding inflammatory changes. Spleen: Normal in size without focal abnormality. Adrenals/Urinary Tract: There is mild left-sided hydroureteronephrosis. There is a 6 mm calculus in the distal left ureter best seen on coronal image 6/53. There is a single punctate left renal calculus. No bladder calculi are visualized. The right kidney and adrenal glands are within normal  limits. Stomach/Bowel: Stomach is within normal limits. Appendix is not visualized. No dilated bowel loops are seen. No bowel obstruction. There is mild inflammatory stranding surrounding the gastric antrum and duodenum. Vascular/Lymphatic: Aortic atherosclerosis. No enlarged abdominal or pelvic lymph nodes. Reproductive: Uterus and bilateral adnexa are unremarkable. Other: There is a small amount of free fluid in the right lower quadrant. There is no free air. There is mild diffuse body wall edema. There is no focal body wall hernia. Musculoskeletal: No fracture is seen. IMPRESSION: 1. 6 mm calculus in the distal left ureter with mild obstructive uropathy. 2. Additional nonobstructing left renal calculus. 3. Inflammatory stranding surrounding the gastric antrum and duodenum may be related to gastritis/duodenitis. Ulcer disease is a consideration. 4. Small amount of free fluid in the right lower quadrant. 5. Gro patchy ground-glass and airspace opacities in the lung bases, right greater than left, worrisome for infection and or edema. 6.  Aortic Atherosclerosis (ICD10-I70.0). Und-glass and Electronically Signed   By: Ronney Asters M.D.   On: 12/29/2020 18:40   DG HIP UNILAT WITH PELVIS 2-3 VIEWS LEFT  Result Date: 12/29/2020 CLINICAL DATA:  Status post fall EXAM: DG HIP (WITH OR WITHOUT PELVIS) 2-3V LEFT COMPARISON:  CT renal 12/29/2020 FINDINGS: There is no evidence of hip fracture or dislocation. There is no evidence of arthropathy or other focal bone abnormality. IMPRESSION: No acute displaced fracture or dislocation with better evaluation of the left hip on CT renal 12/29/2020. Electronically Signed   By: Iven Finn M.D.   On: 12/29/2020 20:21   DG FEMUR MIN 2 VIEWS LEFT  Result Date: 01/06/2021 CLINICAL DATA:  IM nail left femur. EXAM: LEFT FEMUR 2 VIEWS COMPARISON:  Preoperative imaging. FINDINGS: Five fluoroscopic spot views of the left femur obtained in the operating room. Intramedullary nail  with distal locking and trans trochanteric screw fixation of proximal femur fracture. Fluoroscopy time 49 seconds. IMPRESSION: Intraoperative fluoroscopy during ORIF of left femur fracture. Electronically Signed   By: Keith Rake M.D.   On: 01/06/2021 15:59   VAS [place] UPPER EXT VEIN MAPPING (PRE-OP AVF)  Result Date: 01/09/2021 UPPER EXTREMITY VEIN MAPPING Patient Name:  Molly [name]  Date of Exam:   01/07/2021 Medical Rec #: [id number]     Accession #:    [id number] Date of Birth: 1951-01-19     Patient Gender: F Patient Age:   21 years Exam Location:  [facility] Procedure:      VAS [place] UPPER EXT VEIN MAPPING (PRE-OP AVF) Referring Phys: Melony Overly --------------------------------------------------------------------------------  Indications: Pre-access. Comparison Study: No previous exams Performing Technologist: Jody Hill RVT, RDMS  Examination Guidelines: A complete evaluation includes B-mode imaging, spectral Doppler, color Doppler, and power Doppler as needed of all accessible portions of each vessel. Bilateral testing is considered an  integral part of a complete examination. Limited examinations for reoccurring indications may be performed as noted. +-----------------+-------------+----------+-------------------------------+ Right Cephalic   Diameter (cm)Depth (cm)           Findings             +-----------------+-------------+----------+-------------------------------+ Shoulder             0.29        1.51                                   +-----------------+-------------+----------+-------------------------------+ Prox upper arm       0.26        0.57                                   +-----------------+-------------+----------+-------------------------------+ Mid upper arm        0.22        0.68                                   +-----------------+-------------+----------+-------------------------------+ Dist upper arm       0.20        0.64                                    +-----------------+-------------+----------+-------------------------------+ Antecubital fossa    0.35        0.26                                   +-----------------+-------------+----------+-------------------------------+ Prox forearm         0.26        0.46              branching            +-----------------+-------------+----------+-------------------------------+ Mid forearm          0.21        0.69                                   +-----------------+-------------+----------+-------------------------------+ Dist forearm         0.24        0.28                                   +-----------------+-------------+----------+-------------------------------+ Wrist                                   not visualized and IV placement +-----------------+-------------+----------+-------------------------------+ +-----------------+-------------+----------+-------------------------------+ Right Basilic    Diameter (cm)Depth (cm)           Findings             +-----------------+-------------+----------+-------------------------------+ Prox upper arm       0.23                                               +-----------------+-------------+----------+-------------------------------+  Mid upper arm        0.26                                               +-----------------+-------------+----------+-------------------------------+ Dist upper arm       0.18                                               +-----------------+-------------+----------+-------------------------------+ Antecubital fossa    0.16                          branching            +-----------------+-------------+----------+-------------------------------+ Prox forearm         0.12                                               +-----------------+-------------+----------+-------------------------------+ Mid forearm          0.08                                                +-----------------+-------------+----------+-------------------------------+ Distal forearm                                  not visualized          +-----------------+-------------+----------+-------------------------------+ Wrist                                   not visualized and IV placement +-----------------+-------------+----------+-------------------------------+ Subcutaneous edema noted from mid upper arm to mid forearm. +-----------------+-------------+----------+----------------------+ Left Cephalic    Diameter (cm)Depth (cm)       Findings        +-----------------+-------------+----------+----------------------+ Shoulder             0.34        1.14                          +-----------------+-------------+----------+----------------------+ Prox upper arm       0.36        0.97                          +-----------------+-------------+----------+----------------------+ Mid upper arm        0.39        0.99                          +-----------------+-------------+----------+----------------------+ Dist upper arm       0.42        0.91                          +-----------------+-------------+----------+----------------------+ Antecubital fossa    0.58        0.37   branching and thrombus +-----------------+-------------+----------+----------------------+  Prox forearm         0.12        0.73         branching        +-----------------+-------------+----------+----------------------+ Mid forearm          0.07        0.37                          +-----------------+-------------+----------+----------------------+ Dist forearm                                not visualized     +-----------------+-------------+----------+----------------------+ Wrist                                       not visualized     +-----------------+-------------+----------+----------------------+ +-----------------+-------------+----------+---------+ Left Basilic      Diameter (cm)Depth (cm)Findings  +-----------------+-------------+----------+---------+ Prox upper arm       0.34                         +-----------------+-------------+----------+---------+ Mid upper arm        0.23                         +-----------------+-------------+----------+---------+ Dist upper arm       0.16                         +-----------------+-------------+----------+---------+ Antecubital fossa    0.15               branching +-----------------+-------------+----------+---------+ Prox forearm         0.13                         +-----------------+-------------+----------+---------+ Mid forearm          0.12                         +-----------------+-------------+----------+---------+ Distal forearm       0.13                         +-----------------+-------------+----------+---------+ Wrist                0.12                         +-----------------+-------------+----------+---------+ Subcutaneous edema in area of AC fossa Summary:  Left: Age indeterminate SVT seen in cephalic vein in area of AC       fossa. *See table(s) above for measurements and observations.  Diagnosing physician: Servando Snare MD Electronically signed by Servando Snare MD on 01/09/2021 at 11:18:42 AM.    Final    ECHOCARDIOGRAM LIMITED  Result Date: 12/28/2020    ECHOCARDIOGRAM LIMITED REPORT   Patient Name:   Molly [name] Date of Exam: 12/28/2020 Medical Rec #:  [id number]    Height:       62.0 in Accession #:    [id number]   Weight:       145.7 lb Date of Birth:  31-Aug-1950    BSA:          1.671 m Patient Age:  70 years     BP:           125/93 mmHg Patient Gender: F            HR:           113 bpm. Exam Location:  Inpatient Procedure: Limited Echo, Limited Color Doppler and Cardiac Doppler Indications:    R07.9* Chest pain, unspecified. Elevated Troponin  History:        Patient has prior history of Echocardiogram examinations, most                 recent 11/27/2020.  Risk Factors:Hypertension, Dyslipidemia and                 Diabetes.  Sonographer:    Bernadene Person RDCS Referring Phys: [id number] [place]  1. Compared with the echo A999333, systolic function is reduced. Left ventricular ejection fraction, by estimation, is 40 to 45%. The left ventricle has mildly decreased function. The left ventricle demonstrates global hypokinesis. Left ventricular diastolic parameters are indeterminate.  2. Right ventricular systolic function is normal. The right ventricular size is normal. There is moderately elevated pulmonary artery systolic pressure.  3. The pericardial effusion is circumferential.  4. The mitral valve is normal in structure. Mild mitral valve regurgitation. No evidence of mitral stenosis.  5. The aortic valve is tricuspid. There is mild calcification of the aortic valve. There is mild thickening of the aortic valve. Aortic valve regurgitation is not visualized. No aortic stenosis is present.  6. The inferior vena cava is dilated in size with <50% respiratory variability, suggesting right atrial pressure of 15 mmHg. FINDINGS  Left Ventricle: Compared with the echo A999333, systolic function is reduced. Left ventricular ejection fraction, by estimation, is 40 to 45%. The left ventricle has mildly decreased function. The left ventricle demonstrates global hypokinesis. The left ventricular internal cavity size was normal in size. There is no left ventricular hypertrophy. Left ventricular diastolic parameters are indeterminate. Right Ventricle: The right ventricular size is normal. No increase in right ventricular wall thickness. Right ventricular systolic function is normal. There is moderately elevated pulmonary artery systolic pressure. The tricuspid regurgitant velocity is 2.87 m/s, and with an assumed right atrial pressure of 15 mmHg, the estimated right ventricular systolic pressure is [id number] mmHg. Left Atrium: Left atrial size was normal in size. Right  Atrium: Right atrial size was normal in size. Pericardium: Trivial pericardial effusion is present. The pericardial effusion is circumferential. Mitral Valve: The mitral valve is normal in structure. Mild mitral annular calcification. Mild mitral valve regurgitation. No evidence of mitral valve stenosis. Tricuspid Valve: The tricuspid valve is normal in structure. Tricuspid valve regurgitation is trivial. No evidence of tricuspid stenosis. Aortic Valve: The aortic valve is tricuspid. There is mild calcification of the aortic valve. There is mild thickening of the aortic valve. Aortic valve regurgitation is not visualized. No aortic stenosis is present. Pulmonic Valve: The pulmonic valve was normal in structure. Pulmonic valve regurgitation is trivial. No evidence of pulmonic stenosis. Aorta: The aortic root is normal in size and structure. Venous: The inferior vena cava is dilated in size with less than 50% respiratory variability, suggesting right atrial pressure of 15 mmHg. IAS/Shunts: No atrial level shunt detected by color flow Doppler. LEFT VENTRICLE PLAX 2D LVIDd:         4.40 cm LVIDs:         2.05 cm LV PW:         1.00  cm LV IVS:        1.00 cm LVOT diam:     2.10 cm LV SV:         46 LV SV Index:   27 LVOT Area:     3.46 cm  LV Volumes (MOD) LV vol d, MOD A2C: 108.0 ml LV vol d, MOD A4C: 80.2 ml LV vol s, MOD A2C: 66.1 ml LV vol s, MOD A4C: 47.1 ml LV SV MOD A2C:     41.9 ml LV SV MOD A4C:     80.2 ml LV SV MOD BP:      37.5 ml RIGHT VENTRICLE TAPSE (M-mode): 1.1 cm LEFT ATRIUM         Index LA diam:    4.30 cm 2.57 cm/m  AORTIC VALVE LVOT Vmax:   87.37 cm/s LVOT Vmean:  63.833 cm/s LVOT VTI:    0.133 m  AORTA Ao Root diam: 3.20 cm MR Peak grad:    90.2 mmHg   TRICUSPID VALVE MR Mean grad:    57.0 mmHg   TR Peak grad:   32.9 mmHg MR Vmax:         475.00 cm/s TR Vmax:        287.00 cm/s MR Vmean:        357.0 cm/s MR PISA:         1.01 cm    SHUNTS MR PISA Eff ROA: 8 mm       Systemic VTI:  0.13 m MR  PISA Radius:  0.40 cm     Systemic Diam: 2.10 cm Skeet Latch MD Electronically signed by Skeet Latch MD Signature Date/Time: 12/28/2020/1:40:55 PM    Final     Subjective: Patient feels really great today. She has no complaints other than staples of her hip surgery are itchy.   Discharge Exam: Vitals:   01/16/21 2000 01/17/21 0809  BP: (!) 128/51 (!) 143/54  Pulse:  73  Resp: 17 17  Temp: 98.6 F (37 C) (!) 97.5 F (36.4 C)  SpO2: 94% 99%    General: Pt is alert, awake, not in acute distress Cardiovascular: RRR, S1/S2 +, no rubs, no gallops Venous access clean, intact Respiratory: CTA bilaterally, no wheezing, no rhonchi Abdominal: Soft, NT, ND, bowel sounds + Extremities: no edema, no cyanosis. Left hip staples in place and clean  Labs: Basic Metabolic Panel: Recent Labs  Lab 01/13/21 0227 01/14/21 0329 01/15/21 0332 01/16/21 0300 01/17/21 0253  NA 133* 133* 133* 134* 132*  K 3.3* 3.9 3.7 3.2* 3.7  CL 99 100 99 99 99  CO2 '25 25 24 26 25  '$ GLUCOSE 150* 112* 113* 131* 122*  BUN 29* '12 23 9 22  '$ CREATININE 3.53* 2.03* 2.90* 1.87* 2.78*  CALCIUM 7.8* 7.8* 7.9* 7.7* 7.7*  PHOS 3.4 2.1* 2.8 2.0* 2.4*   CBC: Recent Labs  Lab 01/13/21 0227 01/13/21 1335 01/14/21 0329 01/15/21 0332 01/16/21 0300 01/17/21 0253  WBC 11.4*  --  10.4 9.5 10.5 9.8  HGB 7.1* 8.5* 8.3* 8.4* 8.0* 8.2*  HCT 21.7* 26.2* 25.3* 26.7* 25.0* 25.9*  MCV 85.4  --  84.3 86.7 85.9 86.6  PLT 290  --  270 313 273 308   Time coordinating discharge: Over 30 minutes  Richarda Osmond, MD  [facility] 01/17/2021, 5:19 PM Pager   If 7PM-7AM, please contact night-coverage [email or web address] Password [email or web address]

## 2021-01-17 NOTE — Progress Notes (Cosign Needed)
    Durable Medical Equipment  (From admission, onward)           Start     Ordered   01/17/21 1340  For home use only DME lightweight manual wheelchair with seat cushion  Once       Comments: Patient suffers from  acute on chronic respiratory failure with hypoxia and hypercapnia, weakness which impairs their ability to perform daily activities like bathing in the home.  A walker will not resolve  issue with performing activities of daily living. A wheelchair will allow patient to safely perform daily activities. Patient is not able to propel themselves in the home using a standard weight wheelchair due to general weakness. Patient can self propel in the lightweight wheelchair. Length of need Lifetime. Accessories: elevating leg rests (ELRs), wheel locks, extensions and anti-tippers.   01/17/21 1340

## 2021-01-17 NOTE — Progress Notes (Signed)
Physical Therapy Treatment Patient Details Name: Molly [name] MRN: [id number] DOB: [birth year] Today's Date: 01/17/2021   History of Present Illness This is a 70 y.o. female admitted 9/12 with SOB and chest pain.  HTN crisis and afib with RVR.  Pt had fall while in hospital resulting in left hip fx 9/14. Was placed on bedrest. Pt found to have obstructive uropathy and had a ureteral stent placed9/15.  Hemodialysis initiated 9/17 due to uremia with pt with right femoral temporary HD cath in place.  Pt had fall while in hospital resulting in left hip fx and underwent IM nail left hip 9/22.   9/26 s/p R IJ venin tunneled dialysis cath, L brachiocephalic arteriovenous fistula creation and left cephalic vein thrombectomy. PMH: HTN, CHF, diabetes mellitus type 2,  afib, and CKD    PT Comments    Patient progressing slowly towards PT goals. Requires Mod A for bed mobility and Max A of 2 for standing from elevated surface. Bil knees buckled on first standing attempted without warning falling back onto bed. Not able to initiate any steps today due to marked weakness in BLEs. Noted to have cognitive deficits related to awareness, safety and following commands. Pt is a high fall risk and not safe to be home alone. Continue to recommend post acute rehab however pt/spouse refusing. If pt goes home, recommend maximizing HH services. Will follow.    Recommendations for follow up therapy are one component of a multi-disciplinary discharge planning process, led by the attending physician.  Recommendations may be updated based on patient status, additional functional criteria and insurance authorization.  Follow Up Recommendations  CIR;Supervision/Assistance - 24 hour (refusing rehab so recommend maximizing HH services)     Equipment Recommendations  Rolling walker with 5" wheels;Wheelchair cushion (measurements PT);Wheelchair (measurements PT);3in1 (PT)    Recommendations for Other Services       Precautions  / Restrictions Precautions Precautions: Fall Restrictions Weight Bearing Restrictions: Yes LLE Weight Bearing: Weight bearing as tolerated     Mobility  Bed Mobility Overal bed mobility: Needs Assistance Bed Mobility: Supine to Sit;Sit to Sidelying;Rolling Rolling: Min assist   Supine to sit: Mod assist;HOB elevated   Sit to sidelying: Mod assist;HOB elevated General bed mobility comments: pt transitioned to EOB with support for trunk, returned to supine with assist for BLEs    Transfers Overall transfer level: Needs assistance Equipment used: Rolling walker (2 wheeled) Transfers: Sit to/from Stand Sit to Stand: Max assist;+2 physical assistance;+2 safety/equipment         General transfer comment: max assist +2 to power up and steady from EOB with cueing for hand placement, use of momentum, and safety. Stood from EOB x4, bil knees buckled on first bout with pt falling back onto bed, other stand attempts pt locking out knees and supporting on bed rail.  Ambulation/Gait             General Gait Details: unable to step   Stairs             Wheelchair Mobility    Modified Rankin (Stroke Patients Only)       Balance Overall balance assessment: Needs assistance Sitting-balance support: No upper extremity supported;Feet supported Sitting balance-Leahy Scale: Fair Sitting balance - Comments: Able to adjust socks without LOB.   Standing balance support: During functional activity Standing balance-Leahy Scale: Poor Standing balance comment: relies on BUE and external support; bil knee instability/buckling  Cognition Arousal/Alertness: Awake/alert Behavior During Therapy: Flat affect Overall Cognitive Status: Impaired/Different from baseline Area of Impairment: Problem solving;Awareness;Safety/judgement;Following commands                       Following Commands: Follows one step commands consistently;Follows  one step commands with increased time;Follows multi-step commands inconsistently Safety/Judgement: Decreased awareness of safety;Decreased awareness of deficits Awareness: Emergent Problem Solving: Slow processing;Decreased initiation;Difficulty sequencing;Requires verbal cues General Comments: patient oriented and following simple commands, improving awareness but requires cueing for safety throughout session. Has all her pills sitting on her tray.      Exercises      General Comments        Pertinent Vitals/Pain Pain Assessment: Faces Faces Pain Scale: Hurts a little bit Pain Location: left hip Pain Descriptors / Indicators: Other (Comment) (stiff) Pain Intervention(s): Limited activity within patient's tolerance;Monitored during session;Repositioned    Home Living                      Prior Function            PT Goals (current goals can now be found in the care plan section) Acute Rehab PT Goals Patient Stated Goal: to get back home Progress towards PT goals: Progressing toward goals (slowly)    Frequency    Min 5X/week      PT Plan Discharge plan needs to be updated    Co-evaluation PT/OT/SLP Co-Evaluation/Treatment: Yes Reason for Co-Treatment: For patient/therapist safety;To address functional/ADL transfers PT goals addressed during session: Mobility/safety with mobility;Balance OT goals addressed during session: ADL's and self-care      AM-PAC PT "6 Clicks" Mobility   Outcome Measure  Help needed turning from your back to your side while in a flat bed without using bedrails?: A Little Help needed moving from lying on your back to sitting on the side of a flat bed without using bedrails?: A Lot Help needed moving to and from a bed to a chair (including a wheelchair)?: Total Help needed standing up from a chair using your arms (e.g., wheelchair or bedside chair)?: A Lot Help needed to walk in hospital room?: Total Help needed climbing 3-5 steps  with a railing? : Total 6 Click Score: 10    End of Session Equipment Utilized During Treatment: Gait belt Activity Tolerance: Patient tolerated treatment well Patient left: with call bell/phone within reach;in bed;with bed alarm set;with family/visitor present Nurse Communication: Mobility status;Need for lift equipment (stedy) PT Visit Diagnosis: Muscle weakness (generalized) (M62.81);Pain Pain - Right/Left: Left Pain - part of body: Hip     Time: 1218-1250 PT Time Calculation (min) (ACUTE ONLY): 32 min  Charges:  $Therapeutic Activity: 8-22 mins                     Marisa Severin, PT, DPT Acute Rehabilitation Services Pager [phone] Office [place] 01/17/2021, 1:57 PM

## 2021-01-17 NOTE — TOC Transition Note (Signed)
.  [facility]) - CM/SW Discharge Note   Patient Details  Name: Molly [name] MRN: [id number] Date of Birth: 12/12/1950  [facility] CM/SW Contact:  Sharin Mons, RN Phone Number: 01/17/2021, 2:53 PM   Clinical Narrative:    Patient will DC to: home Anticipated DC date: 01/17/2021 Family notified: husband Transport by: car   Admitted with SOB and chest pain.  HTN crisis and afib with RVR. New HD PT.  Per MD patient ready for DC today . RN, patient,and  patient's husband notified of DC. Pt declined CIR. Agreeable to home with home health services. Choice offered. Preference: [facility]. [facility]erral made with [facility] and accepted. DME need noted...referral made with [employer]. Equipment will be delivered to bedside prior to d/c. New to HD, CLIPPED :[facility] on TTS schedule to arrive at 10:00 for 10:25 chair time, pt and husband are aware. Pt without  Rx MED concerns. Post hospital f/u noted on AVS. Husband to provide transportation to home.  RNCM will sign off for now as intervention is no longer needed. Please consult [place] again if new needs arise.   Final next level of care: [place] Barriers to Discharge: No Barriers Identified   Patient Goals and CMS Choice     Choice offered to / list presented to : Patient  Discharge Placement   Discharge Plan and Services   Discharge Planning Services: CM Consult            DME Arranged: Lightweight manual wheelchair with seat cushion DME Agency: [employer] Date DME Agency Contacted: 01/17/21   Representative spoke with at DME Agency: Freda Munro HH Arranged: PT, RN, Nurse's Aide [place] Agency: [facility] Date [facility] Agency Contacted: 01/17/21 Time [place]: [id number] Representative spoke with at [place]: Seth Bake  Social Determinants of Health (SDOH) Interventions Food Insecurity Interventions: Intervention Not Indicated Financial Strain Interventions:  Intervention Not Indicated Housing Interventions: Intervention Not Indicated Transportation Interventions: Intervention Not Indicated   Readmission Risk Interventions No flowsheet data found.

## 2021-01-17 NOTE — Progress Notes (Signed)
Inpatient Rehab Admissions Coordinator:     I spoke with Pt. Regarding potential CIR admit. Pt. States she wants to go home. Husband states that he is willing to take her home with current needs but will not have a ramp completed until Thursday. CIR will sign off.   Clemens Catholic, [place], [place] Admissions Coordinator  [phone] ([place]) [phone] (office)

## 2021-01-17 NOTE — Progress Notes (Signed)
D/C order noted. Spoke to pt's husband via phone. He indicates pt to be d/c to home today. Husband advised that pt will need to be seen at [facility] tomorrow am for first out-pt treatment. Husband aware pt will need to arrive at 9:25 am to complete paperwork prior to 10:25 chair time. Husband confirms that he has information sheet that was provided to him last week with clinic's name,address, phone number, days/time noted. Husband voices understanding on arrangements and has no further questions at this time. Contacted [place] and spoke to [place], Agricultural consultant, to make her aware of pt's d/c today and pt to start tomorrow am. Clinic prepared to see pt tomorrow am. [facility] PA contacted to request orders be sent to clinic for first treatment.   Melven Sartorius Renal Navigator [phone]

## 2021-01-17 NOTE — Progress Notes (Signed)
[facility] Progress Note   70 year old lady with history of hypertension left renal artery stenosis [id number] diastolic dysfunction chronic kidney disease stage IV peripheral artery disease bilateral  superficial femoral artery obstruction.  Anemia, diabetes.  Presented 12/27/2020 with chest pain.  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 Now s/p left ureteral stent placement but with no improvement. -HD started for uremia & failed lasix challenge. Rt fem temp line placed with IR on 9/18.  Hemodialysis started 01/01/2021.    Has a spot at [facility] on TTS schedule to arrive at 10:00 for 10:25 chair time. On pt's first appt, pt will need to arrive at 9:25 in order to complete paperwork prior to first treatment. This will be after she's completed rehab as she likely will be CIR.  Very weak bruit in left upper arm fistula which was thrombectomized intraop (also very pulsatile at the inflow) +  RIJ TC on 01/10/2021.    Next HD planned for Tuesday unless she leaves the hospital before then.  2.Hematuria Left obstructive uropathy-  -in the setting of mildly obstructing 6 mm distal left ureter renal stone seen on CT urogram. S/p left ureteral stent on 9/15. ANCA and lupus serologies wnl. Appreciate urology's assistance   3.Intertrochanteric hip fracture- s/p OR 9/22 with nail fixation.   4.Epistaxis- overnight 9/14, since resolved. Thought to be due to nasal canula.  Anca serologies neg   5.Left renal artery stenosis- Hx of 80-90% proximal left renal artery stenosis. No intervention   6.Hypertension- BP controlled on metop   7.Afib w/ RVR-on amio. Anticoagulation with eliquis   8.Anemia due to chronic kidney disease-  Hgb decr and will need transfusion with 2 units (1 unit 9/28 and 1 unit on hd  9/29).  Started aranesp 192mg weekly (Sat) w/ dialysis.  Can check iron stores outpt given recent transfusion.    9.Diabetes Mellitus Type 2 with Hyperglycemia- -per primary  Subjective:   Awaiting D/C - seems help at home may be an issue and she's not sure of d/c timeline now.  No complaints today   Objective:   BP (!) 143/54 (BP Location: Right Arm)   Pulse 73   Temp (!) 97.5 F (36.4 C) (Oral)   Resp 17   Ht '5\' 2"'$  (1.575 m)   Wt 62.8 kg   SpO2 99%   BMI 25.32 kg/m  No intake or output data in the 24 hours ending 01/17/21 1152  Weight change: -1 kg  Physical Exam: Gen: Lying in bed, no apparent distress CVS: Normal rate, no lower extremity edema Resp: normal WOB on RA [id number] nontender, nondistended Ext:no edema, RIJ TC c/d/i Neuro: Answers questions appropriately, awake and alert Access: Lt BCF bruit present, swelling noted at incision site - says stable; no erythema or drainage  Imaging: No results found.  Labs: BMET Recent Labs  Lab 01/11/21 0059 01/12/21 0340 01/13/21 0227 01/14/21 0329 01/15/21 0332 01/16/21 0300 01/17/21 0253  NA 133* 134* 133* 133* 133* 134* 132*  K 3.8 3.4* 3.3* 3.9 3.7 3.2* 3.7  CL 98 100 99 100 99 99 99  CO2 '22 26 25 25 24 26 25  '$ GLUCOSE 102* 149* 150* 112* 113* 131* 122*  BUN 42* 18 29* '12 23 9 22  '$ CREATININE 4.93* 2.67* 3.53* 2.03* 2.90* 1.87* 2.78*  CALCIUM 8.0* 7.6* 7.8* 7.8*  7.9* 7.7* 7.7*  PHOS 5.2* 2.8 3.4 2.1* 2.8 2.0* 2.4*    CBC Recent Labs  Lab 01/14/21 0329 01/15/21 0332 01/16/21 0300 01/17/21 0253  WBC 10.4 9.5 10.5 9.8  HGB 8.3* 8.4* 8.0* 8.2*  HCT 25.3* 26.7* 25.0* 25.9*  MCV 84.3 86.7 85.9 86.6  PLT 270 313 273 308     Medications:     (feeding supplement) PROSource Plus  30 mL Oral TID BM   sodium chloride   Intravenous Once   sodium chloride   Intravenous Once   acetaminophen  1,000 mg Oral TID   amiodarone  200 mg Oral Daily   apixaban  5 mg Oral BID   Chlorhexidine Gluconate Cloth  6  each Topical Q0600   cilostazol  50 mg Oral BID   darbepoetin (ARANESP) injection - DIALYSIS  100 mcg Intravenous Q Sat-HD   diclofenac Sodium  2 g Topical QID   diphenhydrAMINE  25 mg Oral Once   dronabinol  2.5 mg Oral QAC lunch   ezetimibe  10 mg Oral Daily   famotidine  20 mg Oral Daily   feeding supplement  237 mL Oral TID BM   FLUoxetine  10 mg Oral Daily   isosorbide mononitrate  30 mg Oral Daily   lidocaine  1 patch Transdermal Q24H   metoprolol succinate  50 mg Oral Daily   multivitamin  1 tablet Oral QHS   pantoprazole  40 mg Oral QHS   polyethylene glycol  17 g Oral Daily   rosuvastatin  10 mg Oral Daily      Justin Mend  01/17/2021, 11:52 AM

## 2021-01-18 ENCOUNTER — Telehealth (HOSPITAL_COMMUNITY): Payer: Self-pay | Admitting: Nephrology

## 2021-01-18 ENCOUNTER — Encounter (INDEPENDENT_AMBULATORY_CARE_PROVIDER_SITE_OTHER): Payer: Medicare Other | Admitting: Ophthalmology

## 2021-01-18 NOTE — Telephone Encounter (Signed)
Transition of care contact from inpatient facility  Date of discharge: 01/17/21 Date of contact: 01/18/21 Method: Phone Spoke to: Patient and her husband  Patient contacted to discuss transition of care from recent inpatient hospitalization. Patient was admitted to [facility] from 9/12 - 01/17/2021 with discharge diagnosis of new ESRD, L Hip Fx.  Medication changes were reviewed. Has medications  Patient will follow up with his/her outpatient HD unit on: supposed to have gone today -> SHE DID NOT GO. When asked why, she said she was too weak. Told that this should improve with ongoing dialysis and improvement of anemia, but needs to go to her treatments. To use walker or wheelchair for now until strength improves. Contacted HD unit and they will reach out to her to see if she would be willing to come tomorrow.  Veneta Penton, PA-C [employer] Pager [phone]

## 2021-01-19 NOTE — Progress Notes (Signed)
Error - patient no-show for appt.   Loel Dubonnet, NP

## 2021-01-20 ENCOUNTER — Encounter (HOSPITAL_BASED_OUTPATIENT_CLINIC_OR_DEPARTMENT_OTHER): Payer: Self-pay

## 2021-01-20 ENCOUNTER — Ambulatory Visit (INDEPENDENT_AMBULATORY_CARE_PROVIDER_SITE_OTHER): Payer: Medicare Other | Admitting: Family

## 2021-01-20 DIAGNOSIS — E876 Hypokalemia: Secondary | ICD-10-CM | POA: Diagnosis not present

## 2021-01-20 DIAGNOSIS — D631 Anemia in chronic kidney disease: Secondary | ICD-10-CM | POA: Diagnosis not present

## 2021-01-20 DIAGNOSIS — Z992 Dependence on renal dialysis: Secondary | ICD-10-CM | POA: Diagnosis not present

## 2021-01-20 DIAGNOSIS — D509 Iron deficiency anemia, unspecified: Secondary | ICD-10-CM | POA: Diagnosis not present

## 2021-01-20 DIAGNOSIS — Z23 Encounter for immunization: Secondary | ICD-10-CM | POA: Diagnosis not present

## 2021-01-20 DIAGNOSIS — E1165 Type 2 diabetes mellitus with hyperglycemia: Secondary | ICD-10-CM | POA: Diagnosis not present

## 2021-01-20 DIAGNOSIS — I10 Essential (primary) hypertension: Secondary | ICD-10-CM

## 2021-01-20 DIAGNOSIS — N186 End stage renal disease: Secondary | ICD-10-CM | POA: Diagnosis not present

## 2021-01-20 DIAGNOSIS — N2581 Secondary hyperparathyroidism of renal origin: Secondary | ICD-10-CM | POA: Diagnosis not present

## 2021-01-20 DIAGNOSIS — I120 Hypertensive chronic kidney disease with stage 5 chronic kidney disease or end stage renal disease: Secondary | ICD-10-CM | POA: Diagnosis not present

## 2021-01-20 DIAGNOSIS — T8249XD Other complication of vascular dialysis catheter, subsequent encounter: Secondary | ICD-10-CM | POA: Diagnosis not present

## 2021-01-22 DIAGNOSIS — N186 End stage renal disease: Secondary | ICD-10-CM | POA: Diagnosis not present

## 2021-01-22 DIAGNOSIS — Z23 Encounter for immunization: Secondary | ICD-10-CM | POA: Diagnosis not present

## 2021-01-22 DIAGNOSIS — N2581 Secondary hyperparathyroidism of renal origin: Secondary | ICD-10-CM | POA: Diagnosis not present

## 2021-01-22 DIAGNOSIS — T8249XD Other complication of vascular dialysis catheter, subsequent encounter: Secondary | ICD-10-CM | POA: Diagnosis not present

## 2021-01-22 DIAGNOSIS — Z992 Dependence on renal dialysis: Secondary | ICD-10-CM | POA: Diagnosis not present

## 2021-01-22 DIAGNOSIS — E1165 Type 2 diabetes mellitus with hyperglycemia: Secondary | ICD-10-CM | POA: Diagnosis not present

## 2021-01-22 DIAGNOSIS — D631 Anemia in chronic kidney disease: Secondary | ICD-10-CM | POA: Diagnosis not present

## 2021-01-22 DIAGNOSIS — E876 Hypokalemia: Secondary | ICD-10-CM | POA: Diagnosis not present

## 2021-01-22 DIAGNOSIS — I120 Hypertensive chronic kidney disease with stage 5 chronic kidney disease or end stage renal disease: Secondary | ICD-10-CM | POA: Diagnosis not present

## 2021-01-22 DIAGNOSIS — D509 Iron deficiency anemia, unspecified: Secondary | ICD-10-CM | POA: Diagnosis not present

## 2021-01-25 DIAGNOSIS — D509 Iron deficiency anemia, unspecified: Secondary | ICD-10-CM | POA: Diagnosis not present

## 2021-01-25 DIAGNOSIS — K589 Irritable bowel syndrome without diarrhea: Secondary | ICD-10-CM | POA: Diagnosis not present

## 2021-01-25 DIAGNOSIS — R1319 Other dysphagia: Secondary | ICD-10-CM | POA: Diagnosis not present

## 2021-01-25 DIAGNOSIS — K219 Gastro-esophageal reflux disease without esophagitis: Secondary | ICD-10-CM | POA: Diagnosis not present

## 2021-01-25 DIAGNOSIS — Z993 Dependence on wheelchair: Secondary | ICD-10-CM | POA: Diagnosis not present

## 2021-01-25 DIAGNOSIS — I120 Hypertensive chronic kidney disease with stage 5 chronic kidney disease or end stage renal disease: Secondary | ICD-10-CM | POA: Diagnosis not present

## 2021-01-25 DIAGNOSIS — S72002D Fracture of unspecified part of neck of left femur, subsequent encounter for closed fracture with routine healing: Secondary | ICD-10-CM | POA: Diagnosis not present

## 2021-01-25 DIAGNOSIS — D631 Anemia in chronic kidney disease: Secondary | ICD-10-CM | POA: Diagnosis not present

## 2021-01-25 DIAGNOSIS — I6523 Occlusion and stenosis of bilateral carotid arteries: Secondary | ICD-10-CM | POA: Diagnosis not present

## 2021-01-25 DIAGNOSIS — T8249XD Other complication of vascular dialysis catheter, subsequent encounter: Secondary | ICD-10-CM | POA: Diagnosis not present

## 2021-01-25 DIAGNOSIS — N2581 Secondary hyperparathyroidism of renal origin: Secondary | ICD-10-CM | POA: Diagnosis not present

## 2021-01-25 DIAGNOSIS — I051 Rheumatic mitral insufficiency: Secondary | ICD-10-CM | POA: Diagnosis not present

## 2021-01-25 DIAGNOSIS — Z992 Dependence on renal dialysis: Secondary | ICD-10-CM | POA: Diagnosis not present

## 2021-01-25 DIAGNOSIS — I5033 Acute on chronic diastolic (congestive) heart failure: Secondary | ICD-10-CM | POA: Diagnosis not present

## 2021-01-25 DIAGNOSIS — M8580 Other specified disorders of bone density and structure, unspecified site: Secondary | ICD-10-CM | POA: Diagnosis not present

## 2021-01-25 DIAGNOSIS — E1165 Type 2 diabetes mellitus with hyperglycemia: Secondary | ICD-10-CM | POA: Diagnosis not present

## 2021-01-25 DIAGNOSIS — J9621 Acute and chronic respiratory failure with hypoxia: Secondary | ICD-10-CM | POA: Diagnosis not present

## 2021-01-25 DIAGNOSIS — E559 Vitamin D deficiency, unspecified: Secondary | ICD-10-CM | POA: Diagnosis not present

## 2021-01-25 DIAGNOSIS — E1122 Type 2 diabetes mellitus with diabetic chronic kidney disease: Secondary | ICD-10-CM | POA: Diagnosis not present

## 2021-01-25 DIAGNOSIS — M1990 Unspecified osteoarthritis, unspecified site: Secondary | ICD-10-CM | POA: Diagnosis not present

## 2021-01-25 DIAGNOSIS — Z23 Encounter for immunization: Secondary | ICD-10-CM | POA: Diagnosis not present

## 2021-01-25 DIAGNOSIS — J9622 Acute and chronic respiratory failure with hypercapnia: Secondary | ICD-10-CM | POA: Diagnosis not present

## 2021-01-25 DIAGNOSIS — I701 Atherosclerosis of renal artery: Secondary | ICD-10-CM | POA: Diagnosis not present

## 2021-01-25 DIAGNOSIS — I132 Hypertensive heart and chronic kidney disease with heart failure and with stage 5 chronic kidney disease, or end stage renal disease: Secondary | ICD-10-CM | POA: Diagnosis not present

## 2021-01-25 DIAGNOSIS — L89152 Pressure ulcer of sacral region, stage 2: Secondary | ICD-10-CM | POA: Diagnosis not present

## 2021-01-25 DIAGNOSIS — Z794 Long term (current) use of insulin: Secondary | ICD-10-CM | POA: Diagnosis not present

## 2021-01-25 DIAGNOSIS — I2510 Atherosclerotic heart disease of native coronary artery without angina pectoris: Secondary | ICD-10-CM | POA: Diagnosis not present

## 2021-01-25 DIAGNOSIS — E876 Hypokalemia: Secondary | ICD-10-CM | POA: Diagnosis not present

## 2021-01-25 DIAGNOSIS — E1151 Type 2 diabetes mellitus with diabetic peripheral angiopathy without gangrene: Secondary | ICD-10-CM | POA: Diagnosis not present

## 2021-01-25 DIAGNOSIS — N186 End stage renal disease: Secondary | ICD-10-CM | POA: Diagnosis not present

## 2021-01-25 DIAGNOSIS — E785 Hyperlipidemia, unspecified: Secondary | ICD-10-CM | POA: Diagnosis not present

## 2021-01-26 DIAGNOSIS — M25572 Pain in left ankle and joints of left foot: Secondary | ICD-10-CM | POA: Diagnosis not present

## 2021-01-26 DIAGNOSIS — S72142A Displaced intertrochanteric fracture of left femur, initial encounter for closed fracture: Secondary | ICD-10-CM | POA: Diagnosis not present

## 2021-01-27 DIAGNOSIS — D631 Anemia in chronic kidney disease: Secondary | ICD-10-CM | POA: Diagnosis not present

## 2021-01-27 DIAGNOSIS — Z992 Dependence on renal dialysis: Secondary | ICD-10-CM | POA: Diagnosis not present

## 2021-01-27 DIAGNOSIS — E876 Hypokalemia: Secondary | ICD-10-CM | POA: Diagnosis not present

## 2021-01-27 DIAGNOSIS — I120 Hypertensive chronic kidney disease with stage 5 chronic kidney disease or end stage renal disease: Secondary | ICD-10-CM | POA: Diagnosis not present

## 2021-01-27 DIAGNOSIS — N186 End stage renal disease: Secondary | ICD-10-CM | POA: Diagnosis not present

## 2021-01-27 DIAGNOSIS — N2581 Secondary hyperparathyroidism of renal origin: Secondary | ICD-10-CM | POA: Diagnosis not present

## 2021-01-27 DIAGNOSIS — E1165 Type 2 diabetes mellitus with hyperglycemia: Secondary | ICD-10-CM | POA: Diagnosis not present

## 2021-01-27 DIAGNOSIS — T8249XD Other complication of vascular dialysis catheter, subsequent encounter: Secondary | ICD-10-CM | POA: Diagnosis not present

## 2021-01-27 DIAGNOSIS — D509 Iron deficiency anemia, unspecified: Secondary | ICD-10-CM | POA: Diagnosis not present

## 2021-01-27 DIAGNOSIS — Z23 Encounter for immunization: Secondary | ICD-10-CM | POA: Diagnosis not present

## 2021-01-28 DIAGNOSIS — R1319 Other dysphagia: Secondary | ICD-10-CM | POA: Diagnosis not present

## 2021-01-28 DIAGNOSIS — I701 Atherosclerosis of renal artery: Secondary | ICD-10-CM | POA: Diagnosis not present

## 2021-01-28 DIAGNOSIS — L89152 Pressure ulcer of sacral region, stage 2: Secondary | ICD-10-CM | POA: Diagnosis not present

## 2021-01-28 DIAGNOSIS — Z794 Long term (current) use of insulin: Secondary | ICD-10-CM | POA: Diagnosis not present

## 2021-01-28 DIAGNOSIS — Z993 Dependence on wheelchair: Secondary | ICD-10-CM | POA: Diagnosis not present

## 2021-01-28 DIAGNOSIS — I5033 Acute on chronic diastolic (congestive) heart failure: Secondary | ICD-10-CM | POA: Diagnosis not present

## 2021-01-28 DIAGNOSIS — D631 Anemia in chronic kidney disease: Secondary | ICD-10-CM | POA: Diagnosis not present

## 2021-01-28 DIAGNOSIS — Z992 Dependence on renal dialysis: Secondary | ICD-10-CM | POA: Diagnosis not present

## 2021-01-28 DIAGNOSIS — E785 Hyperlipidemia, unspecified: Secondary | ICD-10-CM | POA: Diagnosis not present

## 2021-01-28 DIAGNOSIS — K589 Irritable bowel syndrome without diarrhea: Secondary | ICD-10-CM | POA: Diagnosis not present

## 2021-01-28 DIAGNOSIS — E1151 Type 2 diabetes mellitus with diabetic peripheral angiopathy without gangrene: Secondary | ICD-10-CM | POA: Diagnosis not present

## 2021-01-28 DIAGNOSIS — I051 Rheumatic mitral insufficiency: Secondary | ICD-10-CM | POA: Diagnosis not present

## 2021-01-28 DIAGNOSIS — S72002D Fracture of unspecified part of neck of left femur, subsequent encounter for closed fracture with routine healing: Secondary | ICD-10-CM | POA: Diagnosis not present

## 2021-01-28 DIAGNOSIS — I132 Hypertensive heart and chronic kidney disease with heart failure and with stage 5 chronic kidney disease, or end stage renal disease: Secondary | ICD-10-CM | POA: Diagnosis not present

## 2021-01-28 DIAGNOSIS — J9621 Acute and chronic respiratory failure with hypoxia: Secondary | ICD-10-CM | POA: Diagnosis not present

## 2021-01-28 DIAGNOSIS — E1122 Type 2 diabetes mellitus with diabetic chronic kidney disease: Secondary | ICD-10-CM | POA: Diagnosis not present

## 2021-01-28 DIAGNOSIS — M1990 Unspecified osteoarthritis, unspecified site: Secondary | ICD-10-CM | POA: Diagnosis not present

## 2021-01-28 DIAGNOSIS — K219 Gastro-esophageal reflux disease without esophagitis: Secondary | ICD-10-CM | POA: Diagnosis not present

## 2021-01-28 DIAGNOSIS — I2510 Atherosclerotic heart disease of native coronary artery without angina pectoris: Secondary | ICD-10-CM | POA: Diagnosis not present

## 2021-01-28 DIAGNOSIS — J9622 Acute and chronic respiratory failure with hypercapnia: Secondary | ICD-10-CM | POA: Diagnosis not present

## 2021-01-28 DIAGNOSIS — E559 Vitamin D deficiency, unspecified: Secondary | ICD-10-CM | POA: Diagnosis not present

## 2021-01-28 DIAGNOSIS — N186 End stage renal disease: Secondary | ICD-10-CM | POA: Diagnosis not present

## 2021-01-28 DIAGNOSIS — I6523 Occlusion and stenosis of bilateral carotid arteries: Secondary | ICD-10-CM | POA: Diagnosis not present

## 2021-01-28 DIAGNOSIS — M8580 Other specified disorders of bone density and structure, unspecified site: Secondary | ICD-10-CM | POA: Diagnosis not present

## 2021-01-29 DIAGNOSIS — T8249XD Other complication of vascular dialysis catheter, subsequent encounter: Secondary | ICD-10-CM | POA: Diagnosis not present

## 2021-01-29 DIAGNOSIS — N186 End stage renal disease: Secondary | ICD-10-CM | POA: Diagnosis not present

## 2021-01-29 DIAGNOSIS — D631 Anemia in chronic kidney disease: Secondary | ICD-10-CM | POA: Diagnosis not present

## 2021-01-29 DIAGNOSIS — N2581 Secondary hyperparathyroidism of renal origin: Secondary | ICD-10-CM | POA: Diagnosis not present

## 2021-01-29 DIAGNOSIS — Z23 Encounter for immunization: Secondary | ICD-10-CM | POA: Diagnosis not present

## 2021-01-29 DIAGNOSIS — Z992 Dependence on renal dialysis: Secondary | ICD-10-CM | POA: Diagnosis not present

## 2021-01-29 DIAGNOSIS — E876 Hypokalemia: Secondary | ICD-10-CM | POA: Diagnosis not present

## 2021-01-29 DIAGNOSIS — E1165 Type 2 diabetes mellitus with hyperglycemia: Secondary | ICD-10-CM | POA: Diagnosis not present

## 2021-01-29 DIAGNOSIS — D509 Iron deficiency anemia, unspecified: Secondary | ICD-10-CM | POA: Diagnosis not present

## 2021-01-29 DIAGNOSIS — I120 Hypertensive chronic kidney disease with stage 5 chronic kidney disease or end stage renal disease: Secondary | ICD-10-CM | POA: Diagnosis not present

## 2021-01-30 DIAGNOSIS — Z992 Dependence on renal dialysis: Secondary | ICD-10-CM | POA: Diagnosis not present

## 2021-01-30 DIAGNOSIS — E1122 Type 2 diabetes mellitus with diabetic chronic kidney disease: Secondary | ICD-10-CM | POA: Diagnosis not present

## 2021-01-30 DIAGNOSIS — E559 Vitamin D deficiency, unspecified: Secondary | ICD-10-CM | POA: Diagnosis not present

## 2021-01-30 DIAGNOSIS — K589 Irritable bowel syndrome without diarrhea: Secondary | ICD-10-CM | POA: Diagnosis not present

## 2021-01-30 DIAGNOSIS — I051 Rheumatic mitral insufficiency: Secondary | ICD-10-CM | POA: Diagnosis not present

## 2021-01-30 DIAGNOSIS — J9622 Acute and chronic respiratory failure with hypercapnia: Secondary | ICD-10-CM | POA: Diagnosis not present

## 2021-01-30 DIAGNOSIS — K219 Gastro-esophageal reflux disease without esophagitis: Secondary | ICD-10-CM | POA: Diagnosis not present

## 2021-01-30 DIAGNOSIS — R1319 Other dysphagia: Secondary | ICD-10-CM | POA: Diagnosis not present

## 2021-01-30 DIAGNOSIS — I132 Hypertensive heart and chronic kidney disease with heart failure and with stage 5 chronic kidney disease, or end stage renal disease: Secondary | ICD-10-CM | POA: Diagnosis not present

## 2021-01-30 DIAGNOSIS — I6523 Occlusion and stenosis of bilateral carotid arteries: Secondary | ICD-10-CM | POA: Diagnosis not present

## 2021-01-30 DIAGNOSIS — E1151 Type 2 diabetes mellitus with diabetic peripheral angiopathy without gangrene: Secondary | ICD-10-CM | POA: Diagnosis not present

## 2021-01-30 DIAGNOSIS — N186 End stage renal disease: Secondary | ICD-10-CM | POA: Diagnosis not present

## 2021-01-30 DIAGNOSIS — D631 Anemia in chronic kidney disease: Secondary | ICD-10-CM | POA: Diagnosis not present

## 2021-01-30 DIAGNOSIS — E785 Hyperlipidemia, unspecified: Secondary | ICD-10-CM | POA: Diagnosis not present

## 2021-01-30 DIAGNOSIS — I701 Atherosclerosis of renal artery: Secondary | ICD-10-CM | POA: Diagnosis not present

## 2021-01-30 DIAGNOSIS — I2510 Atherosclerotic heart disease of native coronary artery without angina pectoris: Secondary | ICD-10-CM | POA: Diagnosis not present

## 2021-01-30 DIAGNOSIS — L89152 Pressure ulcer of sacral region, stage 2: Secondary | ICD-10-CM | POA: Diagnosis not present

## 2021-01-30 DIAGNOSIS — I5033 Acute on chronic diastolic (congestive) heart failure: Secondary | ICD-10-CM | POA: Diagnosis not present

## 2021-01-30 DIAGNOSIS — S72002D Fracture of unspecified part of neck of left femur, subsequent encounter for closed fracture with routine healing: Secondary | ICD-10-CM | POA: Diagnosis not present

## 2021-01-30 DIAGNOSIS — M8580 Other specified disorders of bone density and structure, unspecified site: Secondary | ICD-10-CM | POA: Diagnosis not present

## 2021-01-30 DIAGNOSIS — Z993 Dependence on wheelchair: Secondary | ICD-10-CM | POA: Diagnosis not present

## 2021-01-30 DIAGNOSIS — J9621 Acute and chronic respiratory failure with hypoxia: Secondary | ICD-10-CM | POA: Diagnosis not present

## 2021-01-30 DIAGNOSIS — M1990 Unspecified osteoarthritis, unspecified site: Secondary | ICD-10-CM | POA: Diagnosis not present

## 2021-01-30 DIAGNOSIS — Z794 Long term (current) use of insulin: Secondary | ICD-10-CM | POA: Diagnosis not present

## 2021-01-31 DIAGNOSIS — R634 Abnormal weight loss: Secondary | ICD-10-CM | POA: Diagnosis not present

## 2021-01-31 DIAGNOSIS — I10 Essential (primary) hypertension: Secondary | ICD-10-CM | POA: Diagnosis not present

## 2021-01-31 DIAGNOSIS — E785 Hyperlipidemia, unspecified: Secondary | ICD-10-CM | POA: Diagnosis not present

## 2021-01-31 DIAGNOSIS — I509 Heart failure, unspecified: Secondary | ICD-10-CM | POA: Diagnosis not present

## 2021-01-31 DIAGNOSIS — J9622 Acute and chronic respiratory failure with hypercapnia: Secondary | ICD-10-CM | POA: Diagnosis not present

## 2021-01-31 DIAGNOSIS — E1169 Type 2 diabetes mellitus with other specified complication: Secondary | ICD-10-CM | POA: Diagnosis not present

## 2021-01-31 DIAGNOSIS — I2510 Atherosclerotic heart disease of native coronary artery without angina pectoris: Secondary | ICD-10-CM | POA: Diagnosis not present

## 2021-01-31 DIAGNOSIS — N189 Chronic kidney disease, unspecified: Secondary | ICD-10-CM | POA: Diagnosis not present

## 2021-01-31 DIAGNOSIS — K219 Gastro-esophageal reflux disease without esophagitis: Secondary | ICD-10-CM | POA: Diagnosis not present

## 2021-01-31 DIAGNOSIS — N186 End stage renal disease: Secondary | ICD-10-CM | POA: Diagnosis not present

## 2021-01-31 DIAGNOSIS — D631 Anemia in chronic kidney disease: Secondary | ICD-10-CM | POA: Diagnosis not present

## 2021-01-31 DIAGNOSIS — T148XXA Other injury of unspecified body region, initial encounter: Secondary | ICD-10-CM | POA: Diagnosis not present

## 2021-01-31 DIAGNOSIS — K589 Irritable bowel syndrome without diarrhea: Secondary | ICD-10-CM | POA: Diagnosis not present

## 2021-01-31 DIAGNOSIS — Z794 Long term (current) use of insulin: Secondary | ICD-10-CM | POA: Diagnosis not present

## 2021-01-31 DIAGNOSIS — J9621 Acute and chronic respiratory failure with hypoxia: Secondary | ICD-10-CM | POA: Diagnosis not present

## 2021-01-31 DIAGNOSIS — I132 Hypertensive heart and chronic kidney disease with heart failure and with stage 5 chronic kidney disease, or end stage renal disease: Secondary | ICD-10-CM | POA: Diagnosis not present

## 2021-01-31 DIAGNOSIS — Z992 Dependence on renal dialysis: Secondary | ICD-10-CM | POA: Diagnosis not present

## 2021-01-31 DIAGNOSIS — M1990 Unspecified osteoarthritis, unspecified site: Secondary | ICD-10-CM | POA: Diagnosis not present

## 2021-01-31 DIAGNOSIS — M8580 Other specified disorders of bone density and structure, unspecified site: Secondary | ICD-10-CM | POA: Diagnosis not present

## 2021-01-31 DIAGNOSIS — I701 Atherosclerosis of renal artery: Secondary | ICD-10-CM | POA: Diagnosis not present

## 2021-01-31 DIAGNOSIS — Z993 Dependence on wheelchair: Secondary | ICD-10-CM | POA: Diagnosis not present

## 2021-01-31 DIAGNOSIS — E1151 Type 2 diabetes mellitus with diabetic peripheral angiopathy without gangrene: Secondary | ICD-10-CM | POA: Diagnosis not present

## 2021-01-31 DIAGNOSIS — E1122 Type 2 diabetes mellitus with diabetic chronic kidney disease: Secondary | ICD-10-CM | POA: Diagnosis not present

## 2021-01-31 DIAGNOSIS — S72002D Fracture of unspecified part of neck of left femur, subsequent encounter for closed fracture with routine healing: Secondary | ICD-10-CM | POA: Diagnosis not present

## 2021-01-31 DIAGNOSIS — R1319 Other dysphagia: Secondary | ICD-10-CM | POA: Diagnosis not present

## 2021-01-31 DIAGNOSIS — I739 Peripheral vascular disease, unspecified: Secondary | ICD-10-CM | POA: Diagnosis not present

## 2021-01-31 DIAGNOSIS — I5033 Acute on chronic diastolic (congestive) heart failure: Secondary | ICD-10-CM | POA: Diagnosis not present

## 2021-01-31 DIAGNOSIS — E559 Vitamin D deficiency, unspecified: Secondary | ICD-10-CM | POA: Diagnosis not present

## 2021-01-31 DIAGNOSIS — I051 Rheumatic mitral insufficiency: Secondary | ICD-10-CM | POA: Diagnosis not present

## 2021-01-31 DIAGNOSIS — I6523 Occlusion and stenosis of bilateral carotid arteries: Secondary | ICD-10-CM | POA: Diagnosis not present

## 2021-01-31 DIAGNOSIS — L89152 Pressure ulcer of sacral region, stage 2: Secondary | ICD-10-CM | POA: Diagnosis not present

## 2021-02-01 ENCOUNTER — Telehealth: Payer: Self-pay | Admitting: Cardiology

## 2021-02-01 DIAGNOSIS — N186 End stage renal disease: Secondary | ICD-10-CM | POA: Diagnosis not present

## 2021-02-01 DIAGNOSIS — N2581 Secondary hyperparathyroidism of renal origin: Secondary | ICD-10-CM | POA: Diagnosis not present

## 2021-02-01 DIAGNOSIS — T8249XD Other complication of vascular dialysis catheter, subsequent encounter: Secondary | ICD-10-CM | POA: Diagnosis not present

## 2021-02-01 DIAGNOSIS — D509 Iron deficiency anemia, unspecified: Secondary | ICD-10-CM | POA: Diagnosis not present

## 2021-02-01 DIAGNOSIS — I120 Hypertensive chronic kidney disease with stage 5 chronic kidney disease or end stage renal disease: Secondary | ICD-10-CM | POA: Diagnosis not present

## 2021-02-01 DIAGNOSIS — Z23 Encounter for immunization: Secondary | ICD-10-CM | POA: Diagnosis not present

## 2021-02-01 DIAGNOSIS — D631 Anemia in chronic kidney disease: Secondary | ICD-10-CM | POA: Diagnosis not present

## 2021-02-01 DIAGNOSIS — L89152 Pressure ulcer of sacral region, stage 2: Secondary | ICD-10-CM | POA: Diagnosis not present

## 2021-02-01 DIAGNOSIS — E876 Hypokalemia: Secondary | ICD-10-CM | POA: Diagnosis not present

## 2021-02-01 DIAGNOSIS — Z992 Dependence on renal dialysis: Secondary | ICD-10-CM | POA: Diagnosis not present

## 2021-02-01 DIAGNOSIS — E1165 Type 2 diabetes mellitus with hyperglycemia: Secondary | ICD-10-CM | POA: Diagnosis not present

## 2021-02-01 NOTE — Telephone Encounter (Signed)
*  STAT* If patient is at the pharmacy, call can be transferred to refill team.   1. Which medications need to be refilled? (please list name of each medication and dose if known) metoprolol succinate (TOPROL-XL) 50 MG 24 hr tablet  2. Which pharmacy/location (including street and city if local pharmacy) is medication to be sent to? [id number] [place],  - [address]  3. Do they need a 30 day or 90 day supply? [phone]  Dr. OGabriel Carinastates that the patient says the medication came in as a sprinkle and not a tablet. Please advise

## 2021-02-01 NOTE — Telephone Encounter (Signed)
Spoke with the pharmacy tech and I was informed the metoprolol succ. capsule sprinkle recently sent was from Dr. Felipa Eth. I called and LM to return my call.

## 2021-02-02 DIAGNOSIS — E1122 Type 2 diabetes mellitus with diabetic chronic kidney disease: Secondary | ICD-10-CM | POA: Diagnosis not present

## 2021-02-02 DIAGNOSIS — Z993 Dependence on wheelchair: Secondary | ICD-10-CM | POA: Diagnosis not present

## 2021-02-02 DIAGNOSIS — D631 Anemia in chronic kidney disease: Secondary | ICD-10-CM | POA: Diagnosis not present

## 2021-02-02 DIAGNOSIS — E1151 Type 2 diabetes mellitus with diabetic peripheral angiopathy without gangrene: Secondary | ICD-10-CM | POA: Diagnosis not present

## 2021-02-02 DIAGNOSIS — I051 Rheumatic mitral insufficiency: Secondary | ICD-10-CM | POA: Diagnosis not present

## 2021-02-02 DIAGNOSIS — J9622 Acute and chronic respiratory failure with hypercapnia: Secondary | ICD-10-CM | POA: Diagnosis not present

## 2021-02-02 DIAGNOSIS — I6523 Occlusion and stenosis of bilateral carotid arteries: Secondary | ICD-10-CM | POA: Diagnosis not present

## 2021-02-02 DIAGNOSIS — I2510 Atherosclerotic heart disease of native coronary artery without angina pectoris: Secondary | ICD-10-CM | POA: Diagnosis not present

## 2021-02-02 DIAGNOSIS — Z794 Long term (current) use of insulin: Secondary | ICD-10-CM | POA: Diagnosis not present

## 2021-02-02 DIAGNOSIS — I132 Hypertensive heart and chronic kidney disease with heart failure and with stage 5 chronic kidney disease, or end stage renal disease: Secondary | ICD-10-CM | POA: Diagnosis not present

## 2021-02-02 DIAGNOSIS — J9621 Acute and chronic respiratory failure with hypoxia: Secondary | ICD-10-CM | POA: Diagnosis not present

## 2021-02-02 DIAGNOSIS — N186 End stage renal disease: Secondary | ICD-10-CM | POA: Diagnosis not present

## 2021-02-02 DIAGNOSIS — M1990 Unspecified osteoarthritis, unspecified site: Secondary | ICD-10-CM | POA: Diagnosis not present

## 2021-02-02 DIAGNOSIS — Z992 Dependence on renal dialysis: Secondary | ICD-10-CM | POA: Diagnosis not present

## 2021-02-02 DIAGNOSIS — S72002D Fracture of unspecified part of neck of left femur, subsequent encounter for closed fracture with routine healing: Secondary | ICD-10-CM | POA: Diagnosis not present

## 2021-02-02 DIAGNOSIS — K589 Irritable bowel syndrome without diarrhea: Secondary | ICD-10-CM | POA: Diagnosis not present

## 2021-02-02 DIAGNOSIS — I701 Atherosclerosis of renal artery: Secondary | ICD-10-CM | POA: Diagnosis not present

## 2021-02-02 DIAGNOSIS — E559 Vitamin D deficiency, unspecified: Secondary | ICD-10-CM | POA: Diagnosis not present

## 2021-02-02 DIAGNOSIS — M8580 Other specified disorders of bone density and structure, unspecified site: Secondary | ICD-10-CM | POA: Diagnosis not present

## 2021-02-02 DIAGNOSIS — L89152 Pressure ulcer of sacral region, stage 2: Secondary | ICD-10-CM | POA: Diagnosis not present

## 2021-02-02 DIAGNOSIS — R1319 Other dysphagia: Secondary | ICD-10-CM | POA: Diagnosis not present

## 2021-02-02 DIAGNOSIS — K219 Gastro-esophageal reflux disease without esophagitis: Secondary | ICD-10-CM | POA: Diagnosis not present

## 2021-02-02 DIAGNOSIS — I5033 Acute on chronic diastolic (congestive) heart failure: Secondary | ICD-10-CM | POA: Diagnosis not present

## 2021-02-02 DIAGNOSIS — E785 Hyperlipidemia, unspecified: Secondary | ICD-10-CM | POA: Diagnosis not present

## 2021-02-03 DIAGNOSIS — E785 Hyperlipidemia, unspecified: Secondary | ICD-10-CM | POA: Diagnosis not present

## 2021-02-03 DIAGNOSIS — M1990 Unspecified osteoarthritis, unspecified site: Secondary | ICD-10-CM | POA: Diagnosis not present

## 2021-02-03 DIAGNOSIS — E559 Vitamin D deficiency, unspecified: Secondary | ICD-10-CM | POA: Diagnosis not present

## 2021-02-03 DIAGNOSIS — R1319 Other dysphagia: Secondary | ICD-10-CM | POA: Diagnosis not present

## 2021-02-03 DIAGNOSIS — I051 Rheumatic mitral insufficiency: Secondary | ICD-10-CM | POA: Diagnosis not present

## 2021-02-03 DIAGNOSIS — S72002D Fracture of unspecified part of neck of left femur, subsequent encounter for closed fracture with routine healing: Secondary | ICD-10-CM | POA: Diagnosis not present

## 2021-02-03 DIAGNOSIS — L89152 Pressure ulcer of sacral region, stage 2: Secondary | ICD-10-CM | POA: Diagnosis not present

## 2021-02-03 DIAGNOSIS — Z993 Dependence on wheelchair: Secondary | ICD-10-CM | POA: Diagnosis not present

## 2021-02-03 DIAGNOSIS — M8580 Other specified disorders of bone density and structure, unspecified site: Secondary | ICD-10-CM | POA: Diagnosis not present

## 2021-02-03 DIAGNOSIS — E1151 Type 2 diabetes mellitus with diabetic peripheral angiopathy without gangrene: Secondary | ICD-10-CM | POA: Diagnosis not present

## 2021-02-03 DIAGNOSIS — K219 Gastro-esophageal reflux disease without esophagitis: Secondary | ICD-10-CM | POA: Diagnosis not present

## 2021-02-03 DIAGNOSIS — T8249XD Other complication of vascular dialysis catheter, subsequent encounter: Secondary | ICD-10-CM | POA: Diagnosis not present

## 2021-02-03 DIAGNOSIS — N186 End stage renal disease: Secondary | ICD-10-CM | POA: Diagnosis not present

## 2021-02-03 DIAGNOSIS — D509 Iron deficiency anemia, unspecified: Secondary | ICD-10-CM | POA: Diagnosis not present

## 2021-02-03 DIAGNOSIS — J9622 Acute and chronic respiratory failure with hypercapnia: Secondary | ICD-10-CM | POA: Diagnosis not present

## 2021-02-03 DIAGNOSIS — I2510 Atherosclerotic heart disease of native coronary artery without angina pectoris: Secondary | ICD-10-CM | POA: Diagnosis not present

## 2021-02-03 DIAGNOSIS — I132 Hypertensive heart and chronic kidney disease with heart failure and with stage 5 chronic kidney disease, or end stage renal disease: Secondary | ICD-10-CM | POA: Diagnosis not present

## 2021-02-03 DIAGNOSIS — I120 Hypertensive chronic kidney disease with stage 5 chronic kidney disease or end stage renal disease: Secondary | ICD-10-CM | POA: Diagnosis not present

## 2021-02-03 DIAGNOSIS — E876 Hypokalemia: Secondary | ICD-10-CM | POA: Diagnosis not present

## 2021-02-03 DIAGNOSIS — Z794 Long term (current) use of insulin: Secondary | ICD-10-CM | POA: Diagnosis not present

## 2021-02-03 DIAGNOSIS — K589 Irritable bowel syndrome without diarrhea: Secondary | ICD-10-CM | POA: Diagnosis not present

## 2021-02-03 DIAGNOSIS — I701 Atherosclerosis of renal artery: Secondary | ICD-10-CM | POA: Diagnosis not present

## 2021-02-03 DIAGNOSIS — I6523 Occlusion and stenosis of bilateral carotid arteries: Secondary | ICD-10-CM | POA: Diagnosis not present

## 2021-02-03 DIAGNOSIS — E1165 Type 2 diabetes mellitus with hyperglycemia: Secondary | ICD-10-CM | POA: Diagnosis not present

## 2021-02-03 DIAGNOSIS — E1122 Type 2 diabetes mellitus with diabetic chronic kidney disease: Secondary | ICD-10-CM | POA: Diagnosis not present

## 2021-02-03 DIAGNOSIS — I5033 Acute on chronic diastolic (congestive) heart failure: Secondary | ICD-10-CM | POA: Diagnosis not present

## 2021-02-03 DIAGNOSIS — Z23 Encounter for immunization: Secondary | ICD-10-CM | POA: Diagnosis not present

## 2021-02-03 DIAGNOSIS — N2581 Secondary hyperparathyroidism of renal origin: Secondary | ICD-10-CM | POA: Diagnosis not present

## 2021-02-03 DIAGNOSIS — D631 Anemia in chronic kidney disease: Secondary | ICD-10-CM | POA: Diagnosis not present

## 2021-02-03 DIAGNOSIS — Z992 Dependence on renal dialysis: Secondary | ICD-10-CM | POA: Diagnosis not present

## 2021-02-03 DIAGNOSIS — J9621 Acute and chronic respiratory failure with hypoxia: Secondary | ICD-10-CM | POA: Diagnosis not present

## 2021-02-04 DIAGNOSIS — J9621 Acute and chronic respiratory failure with hypoxia: Secondary | ICD-10-CM | POA: Diagnosis not present

## 2021-02-04 DIAGNOSIS — I6523 Occlusion and stenosis of bilateral carotid arteries: Secondary | ICD-10-CM | POA: Diagnosis not present

## 2021-02-04 DIAGNOSIS — L89152 Pressure ulcer of sacral region, stage 2: Secondary | ICD-10-CM | POA: Diagnosis not present

## 2021-02-04 DIAGNOSIS — Z993 Dependence on wheelchair: Secondary | ICD-10-CM | POA: Diagnosis not present

## 2021-02-04 DIAGNOSIS — M1990 Unspecified osteoarthritis, unspecified site: Secondary | ICD-10-CM | POA: Diagnosis not present

## 2021-02-04 DIAGNOSIS — N186 End stage renal disease: Secondary | ICD-10-CM | POA: Diagnosis not present

## 2021-02-04 DIAGNOSIS — I051 Rheumatic mitral insufficiency: Secondary | ICD-10-CM | POA: Diagnosis not present

## 2021-02-04 DIAGNOSIS — I5033 Acute on chronic diastolic (congestive) heart failure: Secondary | ICD-10-CM | POA: Diagnosis not present

## 2021-02-04 DIAGNOSIS — Z794 Long term (current) use of insulin: Secondary | ICD-10-CM | POA: Diagnosis not present

## 2021-02-04 DIAGNOSIS — M8580 Other specified disorders of bone density and structure, unspecified site: Secondary | ICD-10-CM | POA: Diagnosis not present

## 2021-02-04 DIAGNOSIS — K219 Gastro-esophageal reflux disease without esophagitis: Secondary | ICD-10-CM | POA: Diagnosis not present

## 2021-02-04 DIAGNOSIS — E1151 Type 2 diabetes mellitus with diabetic peripheral angiopathy without gangrene: Secondary | ICD-10-CM | POA: Diagnosis not present

## 2021-02-04 DIAGNOSIS — I2510 Atherosclerotic heart disease of native coronary artery without angina pectoris: Secondary | ICD-10-CM | POA: Diagnosis not present

## 2021-02-04 DIAGNOSIS — S72002D Fracture of unspecified part of neck of left femur, subsequent encounter for closed fracture with routine healing: Secondary | ICD-10-CM | POA: Diagnosis not present

## 2021-02-04 DIAGNOSIS — E559 Vitamin D deficiency, unspecified: Secondary | ICD-10-CM | POA: Diagnosis not present

## 2021-02-04 DIAGNOSIS — E1122 Type 2 diabetes mellitus with diabetic chronic kidney disease: Secondary | ICD-10-CM | POA: Diagnosis not present

## 2021-02-04 DIAGNOSIS — K589 Irritable bowel syndrome without diarrhea: Secondary | ICD-10-CM | POA: Diagnosis not present

## 2021-02-04 DIAGNOSIS — I701 Atherosclerosis of renal artery: Secondary | ICD-10-CM | POA: Diagnosis not present

## 2021-02-04 DIAGNOSIS — Z992 Dependence on renal dialysis: Secondary | ICD-10-CM | POA: Diagnosis not present

## 2021-02-04 DIAGNOSIS — J9622 Acute and chronic respiratory failure with hypercapnia: Secondary | ICD-10-CM | POA: Diagnosis not present

## 2021-02-04 DIAGNOSIS — R1319 Other dysphagia: Secondary | ICD-10-CM | POA: Diagnosis not present

## 2021-02-04 DIAGNOSIS — I132 Hypertensive heart and chronic kidney disease with heart failure and with stage 5 chronic kidney disease, or end stage renal disease: Secondary | ICD-10-CM | POA: Diagnosis not present

## 2021-02-04 DIAGNOSIS — E785 Hyperlipidemia, unspecified: Secondary | ICD-10-CM | POA: Diagnosis not present

## 2021-02-04 DIAGNOSIS — D631 Anemia in chronic kidney disease: Secondary | ICD-10-CM | POA: Diagnosis not present

## 2021-02-05 DIAGNOSIS — N186 End stage renal disease: Secondary | ICD-10-CM | POA: Diagnosis not present

## 2021-02-05 DIAGNOSIS — E876 Hypokalemia: Secondary | ICD-10-CM | POA: Diagnosis not present

## 2021-02-05 DIAGNOSIS — I120 Hypertensive chronic kidney disease with stage 5 chronic kidney disease or end stage renal disease: Secondary | ICD-10-CM | POA: Diagnosis not present

## 2021-02-05 DIAGNOSIS — Z992 Dependence on renal dialysis: Secondary | ICD-10-CM | POA: Diagnosis not present

## 2021-02-05 DIAGNOSIS — N2581 Secondary hyperparathyroidism of renal origin: Secondary | ICD-10-CM | POA: Diagnosis not present

## 2021-02-05 DIAGNOSIS — E1165 Type 2 diabetes mellitus with hyperglycemia: Secondary | ICD-10-CM | POA: Diagnosis not present

## 2021-02-05 DIAGNOSIS — T8249XD Other complication of vascular dialysis catheter, subsequent encounter: Secondary | ICD-10-CM | POA: Diagnosis not present

## 2021-02-05 DIAGNOSIS — Z23 Encounter for immunization: Secondary | ICD-10-CM | POA: Diagnosis not present

## 2021-02-05 DIAGNOSIS — D509 Iron deficiency anemia, unspecified: Secondary | ICD-10-CM | POA: Diagnosis not present

## 2021-02-05 DIAGNOSIS — D631 Anemia in chronic kidney disease: Secondary | ICD-10-CM | POA: Diagnosis not present

## 2021-02-07 DIAGNOSIS — S72002D Fracture of unspecified part of neck of left femur, subsequent encounter for closed fracture with routine healing: Secondary | ICD-10-CM | POA: Diagnosis not present

## 2021-02-07 DIAGNOSIS — E1151 Type 2 diabetes mellitus with diabetic peripheral angiopathy without gangrene: Secondary | ICD-10-CM | POA: Diagnosis not present

## 2021-02-07 DIAGNOSIS — Z992 Dependence on renal dialysis: Secondary | ICD-10-CM | POA: Diagnosis not present

## 2021-02-07 DIAGNOSIS — Z993 Dependence on wheelchair: Secondary | ICD-10-CM | POA: Diagnosis not present

## 2021-02-07 DIAGNOSIS — Z794 Long term (current) use of insulin: Secondary | ICD-10-CM | POA: Diagnosis not present

## 2021-02-07 DIAGNOSIS — M8580 Other specified disorders of bone density and structure, unspecified site: Secondary | ICD-10-CM | POA: Diagnosis not present

## 2021-02-07 DIAGNOSIS — N186 End stage renal disease: Secondary | ICD-10-CM | POA: Diagnosis not present

## 2021-02-07 DIAGNOSIS — J9621 Acute and chronic respiratory failure with hypoxia: Secondary | ICD-10-CM | POA: Diagnosis not present

## 2021-02-07 DIAGNOSIS — E1122 Type 2 diabetes mellitus with diabetic chronic kidney disease: Secondary | ICD-10-CM | POA: Diagnosis not present

## 2021-02-07 DIAGNOSIS — I5033 Acute on chronic diastolic (congestive) heart failure: Secondary | ICD-10-CM | POA: Diagnosis not present

## 2021-02-07 DIAGNOSIS — I6523 Occlusion and stenosis of bilateral carotid arteries: Secondary | ICD-10-CM | POA: Diagnosis not present

## 2021-02-07 DIAGNOSIS — M1990 Unspecified osteoarthritis, unspecified site: Secondary | ICD-10-CM | POA: Diagnosis not present

## 2021-02-07 DIAGNOSIS — I051 Rheumatic mitral insufficiency: Secondary | ICD-10-CM | POA: Diagnosis not present

## 2021-02-07 DIAGNOSIS — J9622 Acute and chronic respiratory failure with hypercapnia: Secondary | ICD-10-CM | POA: Diagnosis not present

## 2021-02-07 DIAGNOSIS — I2510 Atherosclerotic heart disease of native coronary artery without angina pectoris: Secondary | ICD-10-CM | POA: Diagnosis not present

## 2021-02-07 DIAGNOSIS — K589 Irritable bowel syndrome without diarrhea: Secondary | ICD-10-CM | POA: Diagnosis not present

## 2021-02-07 DIAGNOSIS — I701 Atherosclerosis of renal artery: Secondary | ICD-10-CM | POA: Diagnosis not present

## 2021-02-07 DIAGNOSIS — K219 Gastro-esophageal reflux disease without esophagitis: Secondary | ICD-10-CM | POA: Diagnosis not present

## 2021-02-07 DIAGNOSIS — E785 Hyperlipidemia, unspecified: Secondary | ICD-10-CM | POA: Diagnosis not present

## 2021-02-07 DIAGNOSIS — R1319 Other dysphagia: Secondary | ICD-10-CM | POA: Diagnosis not present

## 2021-02-07 DIAGNOSIS — I132 Hypertensive heart and chronic kidney disease with heart failure and with stage 5 chronic kidney disease, or end stage renal disease: Secondary | ICD-10-CM | POA: Diagnosis not present

## 2021-02-07 DIAGNOSIS — D631 Anemia in chronic kidney disease: Secondary | ICD-10-CM | POA: Diagnosis not present

## 2021-02-07 DIAGNOSIS — E559 Vitamin D deficiency, unspecified: Secondary | ICD-10-CM | POA: Diagnosis not present

## 2021-02-07 DIAGNOSIS — L89152 Pressure ulcer of sacral region, stage 2: Secondary | ICD-10-CM | POA: Diagnosis not present

## 2021-02-08 DIAGNOSIS — D631 Anemia in chronic kidney disease: Secondary | ICD-10-CM | POA: Diagnosis not present

## 2021-02-08 DIAGNOSIS — D509 Iron deficiency anemia, unspecified: Secondary | ICD-10-CM | POA: Diagnosis not present

## 2021-02-08 DIAGNOSIS — Z23 Encounter for immunization: Secondary | ICD-10-CM | POA: Diagnosis not present

## 2021-02-08 DIAGNOSIS — I120 Hypertensive chronic kidney disease with stage 5 chronic kidney disease or end stage renal disease: Secondary | ICD-10-CM | POA: Diagnosis not present

## 2021-02-08 DIAGNOSIS — E876 Hypokalemia: Secondary | ICD-10-CM | POA: Diagnosis not present

## 2021-02-08 DIAGNOSIS — N2581 Secondary hyperparathyroidism of renal origin: Secondary | ICD-10-CM | POA: Diagnosis not present

## 2021-02-08 DIAGNOSIS — N186 End stage renal disease: Secondary | ICD-10-CM | POA: Diagnosis not present

## 2021-02-08 DIAGNOSIS — T8249XD Other complication of vascular dialysis catheter, subsequent encounter: Secondary | ICD-10-CM | POA: Diagnosis not present

## 2021-02-08 DIAGNOSIS — Z992 Dependence on renal dialysis: Secondary | ICD-10-CM | POA: Diagnosis not present

## 2021-02-08 DIAGNOSIS — E1165 Type 2 diabetes mellitus with hyperglycemia: Secondary | ICD-10-CM | POA: Diagnosis not present

## 2021-02-09 DIAGNOSIS — D631 Anemia in chronic kidney disease: Secondary | ICD-10-CM | POA: Diagnosis not present

## 2021-02-09 DIAGNOSIS — S72002D Fracture of unspecified part of neck of left femur, subsequent encounter for closed fracture with routine healing: Secondary | ICD-10-CM | POA: Diagnosis not present

## 2021-02-09 DIAGNOSIS — I132 Hypertensive heart and chronic kidney disease with heart failure and with stage 5 chronic kidney disease, or end stage renal disease: Secondary | ICD-10-CM | POA: Diagnosis not present

## 2021-02-09 DIAGNOSIS — E559 Vitamin D deficiency, unspecified: Secondary | ICD-10-CM | POA: Diagnosis not present

## 2021-02-09 DIAGNOSIS — J9621 Acute and chronic respiratory failure with hypoxia: Secondary | ICD-10-CM | POA: Diagnosis not present

## 2021-02-09 DIAGNOSIS — R1319 Other dysphagia: Secondary | ICD-10-CM | POA: Diagnosis not present

## 2021-02-09 DIAGNOSIS — I5033 Acute on chronic diastolic (congestive) heart failure: Secondary | ICD-10-CM | POA: Diagnosis not present

## 2021-02-09 DIAGNOSIS — J9622 Acute and chronic respiratory failure with hypercapnia: Secondary | ICD-10-CM | POA: Diagnosis not present

## 2021-02-09 DIAGNOSIS — I051 Rheumatic mitral insufficiency: Secondary | ICD-10-CM | POA: Diagnosis not present

## 2021-02-09 DIAGNOSIS — Z794 Long term (current) use of insulin: Secondary | ICD-10-CM | POA: Diagnosis not present

## 2021-02-09 DIAGNOSIS — I701 Atherosclerosis of renal artery: Secondary | ICD-10-CM | POA: Diagnosis not present

## 2021-02-09 DIAGNOSIS — L89152 Pressure ulcer of sacral region, stage 2: Secondary | ICD-10-CM | POA: Diagnosis not present

## 2021-02-09 DIAGNOSIS — N186 End stage renal disease: Secondary | ICD-10-CM | POA: Diagnosis not present

## 2021-02-09 DIAGNOSIS — Z992 Dependence on renal dialysis: Secondary | ICD-10-CM | POA: Diagnosis not present

## 2021-02-09 DIAGNOSIS — E785 Hyperlipidemia, unspecified: Secondary | ICD-10-CM | POA: Diagnosis not present

## 2021-02-09 DIAGNOSIS — I6523 Occlusion and stenosis of bilateral carotid arteries: Secondary | ICD-10-CM | POA: Diagnosis not present

## 2021-02-09 DIAGNOSIS — M1990 Unspecified osteoarthritis, unspecified site: Secondary | ICD-10-CM | POA: Diagnosis not present

## 2021-02-09 DIAGNOSIS — Z993 Dependence on wheelchair: Secondary | ICD-10-CM | POA: Diagnosis not present

## 2021-02-09 DIAGNOSIS — K219 Gastro-esophageal reflux disease without esophagitis: Secondary | ICD-10-CM | POA: Diagnosis not present

## 2021-02-09 DIAGNOSIS — M8580 Other specified disorders of bone density and structure, unspecified site: Secondary | ICD-10-CM | POA: Diagnosis not present

## 2021-02-09 DIAGNOSIS — E1122 Type 2 diabetes mellitus with diabetic chronic kidney disease: Secondary | ICD-10-CM | POA: Diagnosis not present

## 2021-02-09 DIAGNOSIS — E1151 Type 2 diabetes mellitus with diabetic peripheral angiopathy without gangrene: Secondary | ICD-10-CM | POA: Diagnosis not present

## 2021-02-09 DIAGNOSIS — K589 Irritable bowel syndrome without diarrhea: Secondary | ICD-10-CM | POA: Diagnosis not present

## 2021-02-09 DIAGNOSIS — I2510 Atherosclerotic heart disease of native coronary artery without angina pectoris: Secondary | ICD-10-CM | POA: Diagnosis not present

## 2021-02-10 DIAGNOSIS — E1165 Type 2 diabetes mellitus with hyperglycemia: Secondary | ICD-10-CM | POA: Diagnosis not present

## 2021-02-10 DIAGNOSIS — T8249XD Other complication of vascular dialysis catheter, subsequent encounter: Secondary | ICD-10-CM | POA: Diagnosis not present

## 2021-02-10 DIAGNOSIS — D509 Iron deficiency anemia, unspecified: Secondary | ICD-10-CM | POA: Diagnosis not present

## 2021-02-10 DIAGNOSIS — N186 End stage renal disease: Secondary | ICD-10-CM | POA: Diagnosis not present

## 2021-02-10 DIAGNOSIS — D631 Anemia in chronic kidney disease: Secondary | ICD-10-CM | POA: Diagnosis not present

## 2021-02-10 DIAGNOSIS — N2581 Secondary hyperparathyroidism of renal origin: Secondary | ICD-10-CM | POA: Diagnosis not present

## 2021-02-10 DIAGNOSIS — Z992 Dependence on renal dialysis: Secondary | ICD-10-CM | POA: Diagnosis not present

## 2021-02-10 DIAGNOSIS — I120 Hypertensive chronic kidney disease with stage 5 chronic kidney disease or end stage renal disease: Secondary | ICD-10-CM | POA: Diagnosis not present

## 2021-02-10 DIAGNOSIS — Z23 Encounter for immunization: Secondary | ICD-10-CM | POA: Diagnosis not present

## 2021-02-10 DIAGNOSIS — E876 Hypokalemia: Secondary | ICD-10-CM | POA: Diagnosis not present

## 2021-02-11 DIAGNOSIS — K589 Irritable bowel syndrome without diarrhea: Secondary | ICD-10-CM | POA: Diagnosis not present

## 2021-02-11 DIAGNOSIS — Z993 Dependence on wheelchair: Secondary | ICD-10-CM | POA: Diagnosis not present

## 2021-02-11 DIAGNOSIS — N186 End stage renal disease: Secondary | ICD-10-CM | POA: Diagnosis not present

## 2021-02-11 DIAGNOSIS — I6523 Occlusion and stenosis of bilateral carotid arteries: Secondary | ICD-10-CM | POA: Diagnosis not present

## 2021-02-11 DIAGNOSIS — S72002D Fracture of unspecified part of neck of left femur, subsequent encounter for closed fracture with routine healing: Secondary | ICD-10-CM | POA: Diagnosis not present

## 2021-02-11 DIAGNOSIS — I051 Rheumatic mitral insufficiency: Secondary | ICD-10-CM | POA: Diagnosis not present

## 2021-02-11 DIAGNOSIS — M1990 Unspecified osteoarthritis, unspecified site: Secondary | ICD-10-CM | POA: Diagnosis not present

## 2021-02-11 DIAGNOSIS — I5033 Acute on chronic diastolic (congestive) heart failure: Secondary | ICD-10-CM | POA: Diagnosis not present

## 2021-02-11 DIAGNOSIS — I132 Hypertensive heart and chronic kidney disease with heart failure and with stage 5 chronic kidney disease, or end stage renal disease: Secondary | ICD-10-CM | POA: Diagnosis not present

## 2021-02-11 DIAGNOSIS — K219 Gastro-esophageal reflux disease without esophagitis: Secondary | ICD-10-CM | POA: Diagnosis not present

## 2021-02-11 DIAGNOSIS — E785 Hyperlipidemia, unspecified: Secondary | ICD-10-CM | POA: Diagnosis not present

## 2021-02-11 DIAGNOSIS — M8580 Other specified disorders of bone density and structure, unspecified site: Secondary | ICD-10-CM | POA: Diagnosis not present

## 2021-02-11 DIAGNOSIS — R1319 Other dysphagia: Secondary | ICD-10-CM | POA: Diagnosis not present

## 2021-02-11 DIAGNOSIS — Z992 Dependence on renal dialysis: Secondary | ICD-10-CM | POA: Diagnosis not present

## 2021-02-11 DIAGNOSIS — J9621 Acute and chronic respiratory failure with hypoxia: Secondary | ICD-10-CM | POA: Diagnosis not present

## 2021-02-11 DIAGNOSIS — L89152 Pressure ulcer of sacral region, stage 2: Secondary | ICD-10-CM | POA: Diagnosis not present

## 2021-02-11 DIAGNOSIS — E1151 Type 2 diabetes mellitus with diabetic peripheral angiopathy without gangrene: Secondary | ICD-10-CM | POA: Diagnosis not present

## 2021-02-11 DIAGNOSIS — I2510 Atherosclerotic heart disease of native coronary artery without angina pectoris: Secondary | ICD-10-CM | POA: Diagnosis not present

## 2021-02-11 DIAGNOSIS — D631 Anemia in chronic kidney disease: Secondary | ICD-10-CM | POA: Diagnosis not present

## 2021-02-11 DIAGNOSIS — J9622 Acute and chronic respiratory failure with hypercapnia: Secondary | ICD-10-CM | POA: Diagnosis not present

## 2021-02-11 DIAGNOSIS — I701 Atherosclerosis of renal artery: Secondary | ICD-10-CM | POA: Diagnosis not present

## 2021-02-11 DIAGNOSIS — E1122 Type 2 diabetes mellitus with diabetic chronic kidney disease: Secondary | ICD-10-CM | POA: Diagnosis not present

## 2021-02-11 DIAGNOSIS — E559 Vitamin D deficiency, unspecified: Secondary | ICD-10-CM | POA: Diagnosis not present

## 2021-02-11 DIAGNOSIS — Z794 Long term (current) use of insulin: Secondary | ICD-10-CM | POA: Diagnosis not present

## 2021-02-12 DIAGNOSIS — T8249XD Other complication of vascular dialysis catheter, subsequent encounter: Secondary | ICD-10-CM | POA: Diagnosis not present

## 2021-02-12 DIAGNOSIS — Z992 Dependence on renal dialysis: Secondary | ICD-10-CM | POA: Diagnosis not present

## 2021-02-12 DIAGNOSIS — N186 End stage renal disease: Secondary | ICD-10-CM | POA: Diagnosis not present

## 2021-02-12 DIAGNOSIS — I120 Hypertensive chronic kidney disease with stage 5 chronic kidney disease or end stage renal disease: Secondary | ICD-10-CM | POA: Diagnosis not present

## 2021-02-12 DIAGNOSIS — Z23 Encounter for immunization: Secondary | ICD-10-CM | POA: Diagnosis not present

## 2021-02-12 DIAGNOSIS — D509 Iron deficiency anemia, unspecified: Secondary | ICD-10-CM | POA: Diagnosis not present

## 2021-02-12 DIAGNOSIS — D631 Anemia in chronic kidney disease: Secondary | ICD-10-CM | POA: Diagnosis not present

## 2021-02-12 DIAGNOSIS — E1165 Type 2 diabetes mellitus with hyperglycemia: Secondary | ICD-10-CM | POA: Diagnosis not present

## 2021-02-12 DIAGNOSIS — E876 Hypokalemia: Secondary | ICD-10-CM | POA: Diagnosis not present

## 2021-02-12 DIAGNOSIS — N2581 Secondary hyperparathyroidism of renal origin: Secondary | ICD-10-CM | POA: Diagnosis not present

## 2021-02-14 DIAGNOSIS — I129 Hypertensive chronic kidney disease with stage 1 through stage 4 chronic kidney disease, or unspecified chronic kidney disease: Secondary | ICD-10-CM | POA: Diagnosis not present

## 2021-02-14 DIAGNOSIS — N186 End stage renal disease: Secondary | ICD-10-CM | POA: Diagnosis not present

## 2021-02-14 DIAGNOSIS — N201 Calculus of ureter: Secondary | ICD-10-CM | POA: Diagnosis not present

## 2021-02-14 DIAGNOSIS — Z992 Dependence on renal dialysis: Secondary | ICD-10-CM | POA: Diagnosis not present

## 2021-02-15 ENCOUNTER — Other Ambulatory Visit: Payer: Self-pay | Admitting: *Deleted

## 2021-02-15 ENCOUNTER — Encounter (HOSPITAL_COMMUNITY): Payer: Medicare Other

## 2021-02-15 DIAGNOSIS — N186 End stage renal disease: Secondary | ICD-10-CM | POA: Diagnosis not present

## 2021-02-15 DIAGNOSIS — T8249XD Other complication of vascular dialysis catheter, subsequent encounter: Secondary | ICD-10-CM | POA: Diagnosis not present

## 2021-02-15 DIAGNOSIS — I120 Hypertensive chronic kidney disease with stage 5 chronic kidney disease or end stage renal disease: Secondary | ICD-10-CM | POA: Diagnosis not present

## 2021-02-15 DIAGNOSIS — N184 Chronic kidney disease, stage 4 (severe): Secondary | ICD-10-CM

## 2021-02-15 DIAGNOSIS — D631 Anemia in chronic kidney disease: Secondary | ICD-10-CM | POA: Diagnosis not present

## 2021-02-15 DIAGNOSIS — Z992 Dependence on renal dialysis: Secondary | ICD-10-CM | POA: Diagnosis not present

## 2021-02-15 DIAGNOSIS — N2581 Secondary hyperparathyroidism of renal origin: Secondary | ICD-10-CM | POA: Diagnosis not present

## 2021-02-15 DIAGNOSIS — E876 Hypokalemia: Secondary | ICD-10-CM | POA: Diagnosis not present

## 2021-02-15 DIAGNOSIS — D509 Iron deficiency anemia, unspecified: Secondary | ICD-10-CM | POA: Diagnosis not present

## 2021-02-16 DIAGNOSIS — E559 Vitamin D deficiency, unspecified: Secondary | ICD-10-CM | POA: Diagnosis not present

## 2021-02-16 DIAGNOSIS — J9621 Acute and chronic respiratory failure with hypoxia: Secondary | ICD-10-CM | POA: Diagnosis not present

## 2021-02-16 DIAGNOSIS — N186 End stage renal disease: Secondary | ICD-10-CM | POA: Diagnosis not present

## 2021-02-16 DIAGNOSIS — Z794 Long term (current) use of insulin: Secondary | ICD-10-CM | POA: Diagnosis not present

## 2021-02-16 DIAGNOSIS — E1151 Type 2 diabetes mellitus with diabetic peripheral angiopathy without gangrene: Secondary | ICD-10-CM | POA: Diagnosis not present

## 2021-02-16 DIAGNOSIS — I2510 Atherosclerotic heart disease of native coronary artery without angina pectoris: Secondary | ICD-10-CM | POA: Diagnosis not present

## 2021-02-16 DIAGNOSIS — K589 Irritable bowel syndrome without diarrhea: Secondary | ICD-10-CM | POA: Diagnosis not present

## 2021-02-16 DIAGNOSIS — I6523 Occlusion and stenosis of bilateral carotid arteries: Secondary | ICD-10-CM | POA: Diagnosis not present

## 2021-02-16 DIAGNOSIS — L89152 Pressure ulcer of sacral region, stage 2: Secondary | ICD-10-CM | POA: Diagnosis not present

## 2021-02-16 DIAGNOSIS — I701 Atherosclerosis of renal artery: Secondary | ICD-10-CM | POA: Diagnosis not present

## 2021-02-16 DIAGNOSIS — J9622 Acute and chronic respiratory failure with hypercapnia: Secondary | ICD-10-CM | POA: Diagnosis not present

## 2021-02-16 DIAGNOSIS — Z993 Dependence on wheelchair: Secondary | ICD-10-CM | POA: Diagnosis not present

## 2021-02-16 DIAGNOSIS — E785 Hyperlipidemia, unspecified: Secondary | ICD-10-CM | POA: Diagnosis not present

## 2021-02-16 DIAGNOSIS — I051 Rheumatic mitral insufficiency: Secondary | ICD-10-CM | POA: Diagnosis not present

## 2021-02-16 DIAGNOSIS — M8580 Other specified disorders of bone density and structure, unspecified site: Secondary | ICD-10-CM | POA: Diagnosis not present

## 2021-02-16 DIAGNOSIS — I5033 Acute on chronic diastolic (congestive) heart failure: Secondary | ICD-10-CM | POA: Diagnosis not present

## 2021-02-16 DIAGNOSIS — E1122 Type 2 diabetes mellitus with diabetic chronic kidney disease: Secondary | ICD-10-CM | POA: Diagnosis not present

## 2021-02-16 DIAGNOSIS — I132 Hypertensive heart and chronic kidney disease with heart failure and with stage 5 chronic kidney disease, or end stage renal disease: Secondary | ICD-10-CM | POA: Diagnosis not present

## 2021-02-16 DIAGNOSIS — R1319 Other dysphagia: Secondary | ICD-10-CM | POA: Diagnosis not present

## 2021-02-16 DIAGNOSIS — M1990 Unspecified osteoarthritis, unspecified site: Secondary | ICD-10-CM | POA: Diagnosis not present

## 2021-02-16 DIAGNOSIS — K219 Gastro-esophageal reflux disease without esophagitis: Secondary | ICD-10-CM | POA: Diagnosis not present

## 2021-02-16 DIAGNOSIS — Z992 Dependence on renal dialysis: Secondary | ICD-10-CM | POA: Diagnosis not present

## 2021-02-16 DIAGNOSIS — S72002D Fracture of unspecified part of neck of left femur, subsequent encounter for closed fracture with routine healing: Secondary | ICD-10-CM | POA: Diagnosis not present

## 2021-02-16 DIAGNOSIS — D631 Anemia in chronic kidney disease: Secondary | ICD-10-CM | POA: Diagnosis not present

## 2021-02-17 DIAGNOSIS — T8249XD Other complication of vascular dialysis catheter, subsequent encounter: Secondary | ICD-10-CM | POA: Diagnosis not present

## 2021-02-17 DIAGNOSIS — E876 Hypokalemia: Secondary | ICD-10-CM | POA: Diagnosis not present

## 2021-02-17 DIAGNOSIS — D509 Iron deficiency anemia, unspecified: Secondary | ICD-10-CM | POA: Diagnosis not present

## 2021-02-17 DIAGNOSIS — I120 Hypertensive chronic kidney disease with stage 5 chronic kidney disease or end stage renal disease: Secondary | ICD-10-CM | POA: Diagnosis not present

## 2021-02-17 DIAGNOSIS — N186 End stage renal disease: Secondary | ICD-10-CM | POA: Diagnosis not present

## 2021-02-17 DIAGNOSIS — Z992 Dependence on renal dialysis: Secondary | ICD-10-CM | POA: Diagnosis not present

## 2021-02-17 DIAGNOSIS — R531 Weakness: Secondary | ICD-10-CM | POA: Diagnosis not present

## 2021-02-17 DIAGNOSIS — D631 Anemia in chronic kidney disease: Secondary | ICD-10-CM | POA: Diagnosis not present

## 2021-02-17 DIAGNOSIS — N2581 Secondary hyperparathyroidism of renal origin: Secondary | ICD-10-CM | POA: Diagnosis not present

## 2021-02-18 ENCOUNTER — Ambulatory Visit: Payer: Medicare Other | Admitting: Podiatry

## 2021-02-18 ENCOUNTER — Other Ambulatory Visit: Payer: Self-pay

## 2021-02-18 DIAGNOSIS — I051 Rheumatic mitral insufficiency: Secondary | ICD-10-CM | POA: Diagnosis not present

## 2021-02-18 DIAGNOSIS — I5033 Acute on chronic diastolic (congestive) heart failure: Secondary | ICD-10-CM | POA: Diagnosis not present

## 2021-02-18 DIAGNOSIS — I701 Atherosclerosis of renal artery: Secondary | ICD-10-CM | POA: Diagnosis not present

## 2021-02-18 DIAGNOSIS — I2510 Atherosclerotic heart disease of native coronary artery without angina pectoris: Secondary | ICD-10-CM | POA: Diagnosis not present

## 2021-02-18 DIAGNOSIS — Z992 Dependence on renal dialysis: Secondary | ICD-10-CM | POA: Diagnosis not present

## 2021-02-18 DIAGNOSIS — R1319 Other dysphagia: Secondary | ICD-10-CM | POA: Diagnosis not present

## 2021-02-18 DIAGNOSIS — K589 Irritable bowel syndrome without diarrhea: Secondary | ICD-10-CM | POA: Diagnosis not present

## 2021-02-18 DIAGNOSIS — Z794 Long term (current) use of insulin: Secondary | ICD-10-CM | POA: Diagnosis not present

## 2021-02-18 DIAGNOSIS — L89152 Pressure ulcer of sacral region, stage 2: Secondary | ICD-10-CM | POA: Diagnosis not present

## 2021-02-18 DIAGNOSIS — Z993 Dependence on wheelchair: Secondary | ICD-10-CM | POA: Diagnosis not present

## 2021-02-18 DIAGNOSIS — M8580 Other specified disorders of bone density and structure, unspecified site: Secondary | ICD-10-CM | POA: Diagnosis not present

## 2021-02-18 DIAGNOSIS — J9622 Acute and chronic respiratory failure with hypercapnia: Secondary | ICD-10-CM | POA: Diagnosis not present

## 2021-02-18 DIAGNOSIS — S72002D Fracture of unspecified part of neck of left femur, subsequent encounter for closed fracture with routine healing: Secondary | ICD-10-CM | POA: Diagnosis not present

## 2021-02-18 DIAGNOSIS — I132 Hypertensive heart and chronic kidney disease with heart failure and with stage 5 chronic kidney disease, or end stage renal disease: Secondary | ICD-10-CM | POA: Diagnosis not present

## 2021-02-18 DIAGNOSIS — D631 Anemia in chronic kidney disease: Secondary | ICD-10-CM | POA: Diagnosis not present

## 2021-02-18 DIAGNOSIS — L89621 Pressure ulcer of left heel, stage 1: Secondary | ICD-10-CM | POA: Diagnosis not present

## 2021-02-18 DIAGNOSIS — I6523 Occlusion and stenosis of bilateral carotid arteries: Secondary | ICD-10-CM | POA: Diagnosis not present

## 2021-02-18 DIAGNOSIS — N186 End stage renal disease: Secondary | ICD-10-CM | POA: Diagnosis not present

## 2021-02-18 DIAGNOSIS — K219 Gastro-esophageal reflux disease without esophagitis: Secondary | ICD-10-CM | POA: Diagnosis not present

## 2021-02-18 DIAGNOSIS — J9621 Acute and chronic respiratory failure with hypoxia: Secondary | ICD-10-CM | POA: Diagnosis not present

## 2021-02-18 DIAGNOSIS — E1122 Type 2 diabetes mellitus with diabetic chronic kidney disease: Secondary | ICD-10-CM | POA: Diagnosis not present

## 2021-02-18 DIAGNOSIS — E1151 Type 2 diabetes mellitus with diabetic peripheral angiopathy without gangrene: Secondary | ICD-10-CM

## 2021-02-18 DIAGNOSIS — M1990 Unspecified osteoarthritis, unspecified site: Secondary | ICD-10-CM | POA: Diagnosis not present

## 2021-02-18 DIAGNOSIS — E559 Vitamin D deficiency, unspecified: Secondary | ICD-10-CM | POA: Diagnosis not present

## 2021-02-18 DIAGNOSIS — E785 Hyperlipidemia, unspecified: Secondary | ICD-10-CM | POA: Diagnosis not present

## 2021-02-19 DIAGNOSIS — N2581 Secondary hyperparathyroidism of renal origin: Secondary | ICD-10-CM | POA: Diagnosis not present

## 2021-02-19 DIAGNOSIS — I120 Hypertensive chronic kidney disease with stage 5 chronic kidney disease or end stage renal disease: Secondary | ICD-10-CM | POA: Diagnosis not present

## 2021-02-19 DIAGNOSIS — T8249XD Other complication of vascular dialysis catheter, subsequent encounter: Secondary | ICD-10-CM | POA: Diagnosis not present

## 2021-02-19 DIAGNOSIS — N186 End stage renal disease: Secondary | ICD-10-CM | POA: Diagnosis not present

## 2021-02-19 DIAGNOSIS — D631 Anemia in chronic kidney disease: Secondary | ICD-10-CM | POA: Diagnosis not present

## 2021-02-19 DIAGNOSIS — Z992 Dependence on renal dialysis: Secondary | ICD-10-CM | POA: Diagnosis not present

## 2021-02-19 DIAGNOSIS — D509 Iron deficiency anemia, unspecified: Secondary | ICD-10-CM | POA: Diagnosis not present

## 2021-02-19 DIAGNOSIS — E876 Hypokalemia: Secondary | ICD-10-CM | POA: Diagnosis not present

## 2021-02-21 ENCOUNTER — Other Ambulatory Visit: Payer: Self-pay

## 2021-02-21 ENCOUNTER — Ambulatory Visit (HOSPITAL_COMMUNITY)
Admission: RE | Admit: 2021-02-21 | Discharge: 2021-02-21 | Disposition: A | Discharge status: Home / Self Care | Payer: Medicare Other | Source: Ambulatory Visit | Attending: Vascular Surgery | Admitting: Vascular Surgery

## 2021-02-21 ENCOUNTER — Ambulatory Visit (INDEPENDENT_AMBULATORY_CARE_PROVIDER_SITE_OTHER): Payer: Medicare Other | Admitting: Physician Assistant

## 2021-02-21 VITALS — BP 218/78 | HR 60 | Temp 97.3°F | Resp 20 | Ht 62.0 in | Wt 138.0 lb

## 2021-02-21 DIAGNOSIS — I132 Hypertensive heart and chronic kidney disease with heart failure and with stage 5 chronic kidney disease, or end stage renal disease: Secondary | ICD-10-CM | POA: Diagnosis not present

## 2021-02-21 DIAGNOSIS — E785 Hyperlipidemia, unspecified: Secondary | ICD-10-CM | POA: Diagnosis not present

## 2021-02-21 DIAGNOSIS — N184 Chronic kidney disease, stage 4 (severe): Secondary | ICD-10-CM | POA: Insufficient documentation

## 2021-02-21 DIAGNOSIS — E559 Vitamin D deficiency, unspecified: Secondary | ICD-10-CM | POA: Diagnosis not present

## 2021-02-21 DIAGNOSIS — I6523 Occlusion and stenosis of bilateral carotid arteries: Secondary | ICD-10-CM | POA: Diagnosis not present

## 2021-02-21 DIAGNOSIS — M1990 Unspecified osteoarthritis, unspecified site: Secondary | ICD-10-CM | POA: Diagnosis not present

## 2021-02-21 DIAGNOSIS — M8580 Other specified disorders of bone density and structure, unspecified site: Secondary | ICD-10-CM | POA: Diagnosis not present

## 2021-02-21 DIAGNOSIS — L89152 Pressure ulcer of sacral region, stage 2: Secondary | ICD-10-CM | POA: Diagnosis not present

## 2021-02-21 DIAGNOSIS — Z993 Dependence on wheelchair: Secondary | ICD-10-CM | POA: Diagnosis not present

## 2021-02-21 DIAGNOSIS — S72002D Fracture of unspecified part of neck of left femur, subsequent encounter for closed fracture with routine healing: Secondary | ICD-10-CM | POA: Diagnosis not present

## 2021-02-21 DIAGNOSIS — J9622 Acute and chronic respiratory failure with hypercapnia: Secondary | ICD-10-CM | POA: Diagnosis not present

## 2021-02-21 DIAGNOSIS — Z794 Long term (current) use of insulin: Secondary | ICD-10-CM | POA: Diagnosis not present

## 2021-02-21 DIAGNOSIS — F329 Major depressive disorder, single episode, unspecified: Secondary | ICD-10-CM | POA: Insufficient documentation

## 2021-02-21 DIAGNOSIS — Z992 Dependence on renal dialysis: Secondary | ICD-10-CM | POA: Diagnosis not present

## 2021-02-21 DIAGNOSIS — J9621 Acute and chronic respiratory failure with hypoxia: Secondary | ICD-10-CM | POA: Diagnosis not present

## 2021-02-21 DIAGNOSIS — I2510 Atherosclerotic heart disease of native coronary artery without angina pectoris: Secondary | ICD-10-CM | POA: Diagnosis not present

## 2021-02-21 DIAGNOSIS — N950 Postmenopausal bleeding: Secondary | ICD-10-CM | POA: Insufficient documentation

## 2021-02-21 DIAGNOSIS — N186 End stage renal disease: Secondary | ICD-10-CM | POA: Diagnosis not present

## 2021-02-21 DIAGNOSIS — E1151 Type 2 diabetes mellitus with diabetic peripheral angiopathy without gangrene: Secondary | ICD-10-CM | POA: Diagnosis not present

## 2021-02-21 DIAGNOSIS — E1122 Type 2 diabetes mellitus with diabetic chronic kidney disease: Secondary | ICD-10-CM | POA: Diagnosis not present

## 2021-02-21 DIAGNOSIS — I701 Atherosclerosis of renal artery: Secondary | ICD-10-CM | POA: Diagnosis not present

## 2021-02-21 DIAGNOSIS — I5033 Acute on chronic diastolic (congestive) heart failure: Secondary | ICD-10-CM | POA: Diagnosis not present

## 2021-02-21 DIAGNOSIS — R1319 Other dysphagia: Secondary | ICD-10-CM | POA: Diagnosis not present

## 2021-02-21 DIAGNOSIS — I5032 Chronic diastolic (congestive) heart failure: Secondary | ICD-10-CM | POA: Insufficient documentation

## 2021-02-21 DIAGNOSIS — D631 Anemia in chronic kidney disease: Secondary | ICD-10-CM | POA: Diagnosis not present

## 2021-02-21 DIAGNOSIS — K589 Irritable bowel syndrome without diarrhea: Secondary | ICD-10-CM | POA: Diagnosis not present

## 2021-02-21 DIAGNOSIS — K219 Gastro-esophageal reflux disease without esophagitis: Secondary | ICD-10-CM | POA: Diagnosis not present

## 2021-02-21 DIAGNOSIS — I051 Rheumatic mitral insufficiency: Secondary | ICD-10-CM | POA: Diagnosis not present

## 2021-02-21 NOTE — Progress Notes (Signed)
    Postoperative Access Visit   History of Present Illness   Molly [name] is a 70 y.o. year old female who presents for postoperative follow-up for: insertion of right internal jugular vein TDC and left brachiocephalic AV fistula creation, left cephalic vein thrombectomy by Dr. Stanford Breed on 01/10/21.  The patient's wounds are healed.  The patient notes no steal symptoms.  The patient is  able to complete their activities of daily living.    She dialyzes via right IJ TDC on Tues/ Thurs/ Sat at the [facility] location  Physical Examination   Vitals:   02/21/21 0859  BP: (!) 218/78  Pulse: 60  Resp: 20  Temp: (!) 97.3 F (36.3 C)  TempSrc: Temporal  SpO2: 98%  Weight: 138 lb (62.6 kg)  Height: 5\' 2"  (1.575 m)   Body mass index is 25.24 kg/m.  left arm Incision is healed, 2+ radial pulse, hand grip is 5/5, sensation in digits is  intact, palpable thrill, bruit can  be auscultated. Fistula becomes deep in left upper arm    Medical Decision Making   Molly [name] is a 70 y.o. year old female who presents s/p insertion of right internal jugular vein TDC and left brachiocephalic AV fistula creation, left cephalic vein thrombectomy by Dr. Stanford Breed on 01/10/21. No signs or symptoms of steal syndrome. Duplex shows patent fistula. There is some elevated velocities in the distal left upper arm fistula and the fistula becomes deep in the mid to proximal left upper arm. Fistula has not fully matured as well. I have discussed with patient and her husband bringing her back in short interval follow up and having her exercise her left upper arm to try to mature fistula more vs taking her to OR for elevation. She would like to avoid surgery at this time. I think it is reasonable to try this. I discussed with her that there is still possibility that she may need it elevated despite exercises. Patient and her husband understand and will follow up in 4 weeks  Karoline Caldwell, PA-C [facility] Office: [phone]  Clinic MD: Trula Slade

## 2021-02-22 DIAGNOSIS — E876 Hypokalemia: Secondary | ICD-10-CM | POA: Diagnosis not present

## 2021-02-22 DIAGNOSIS — T8249XD Other complication of vascular dialysis catheter, subsequent encounter: Secondary | ICD-10-CM | POA: Diagnosis not present

## 2021-02-22 DIAGNOSIS — N2581 Secondary hyperparathyroidism of renal origin: Secondary | ICD-10-CM | POA: Diagnosis not present

## 2021-02-22 DIAGNOSIS — Z992 Dependence on renal dialysis: Secondary | ICD-10-CM | POA: Diagnosis not present

## 2021-02-22 DIAGNOSIS — I120 Hypertensive chronic kidney disease with stage 5 chronic kidney disease or end stage renal disease: Secondary | ICD-10-CM | POA: Diagnosis not present

## 2021-02-22 DIAGNOSIS — N186 End stage renal disease: Secondary | ICD-10-CM | POA: Diagnosis not present

## 2021-02-22 DIAGNOSIS — D509 Iron deficiency anemia, unspecified: Secondary | ICD-10-CM | POA: Diagnosis not present

## 2021-02-22 DIAGNOSIS — D631 Anemia in chronic kidney disease: Secondary | ICD-10-CM | POA: Diagnosis not present

## 2021-02-23 ENCOUNTER — Other Ambulatory Visit: Payer: Self-pay

## 2021-02-23 ENCOUNTER — Telehealth: Payer: Self-pay | Admitting: Cardiology

## 2021-02-23 ENCOUNTER — Ambulatory Visit: Payer: Medicare Other

## 2021-02-23 DIAGNOSIS — Z794 Long term (current) use of insulin: Secondary | ICD-10-CM | POA: Diagnosis not present

## 2021-02-23 DIAGNOSIS — I6523 Occlusion and stenosis of bilateral carotid arteries: Secondary | ICD-10-CM | POA: Diagnosis not present

## 2021-02-23 DIAGNOSIS — Z993 Dependence on wheelchair: Secondary | ICD-10-CM | POA: Diagnosis not present

## 2021-02-23 DIAGNOSIS — I051 Rheumatic mitral insufficiency: Secondary | ICD-10-CM | POA: Diagnosis not present

## 2021-02-23 DIAGNOSIS — Z992 Dependence on renal dialysis: Secondary | ICD-10-CM | POA: Diagnosis not present

## 2021-02-23 DIAGNOSIS — E1151 Type 2 diabetes mellitus with diabetic peripheral angiopathy without gangrene: Secondary | ICD-10-CM | POA: Diagnosis not present

## 2021-02-23 DIAGNOSIS — K589 Irritable bowel syndrome without diarrhea: Secondary | ICD-10-CM | POA: Diagnosis not present

## 2021-02-23 DIAGNOSIS — E785 Hyperlipidemia, unspecified: Secondary | ICD-10-CM | POA: Diagnosis not present

## 2021-02-23 DIAGNOSIS — J9621 Acute and chronic respiratory failure with hypoxia: Secondary | ICD-10-CM | POA: Diagnosis not present

## 2021-02-23 DIAGNOSIS — M8580 Other specified disorders of bone density and structure, unspecified site: Secondary | ICD-10-CM | POA: Diagnosis not present

## 2021-02-23 DIAGNOSIS — E1122 Type 2 diabetes mellitus with diabetic chronic kidney disease: Secondary | ICD-10-CM | POA: Diagnosis not present

## 2021-02-23 DIAGNOSIS — D631 Anemia in chronic kidney disease: Secondary | ICD-10-CM | POA: Diagnosis not present

## 2021-02-23 DIAGNOSIS — S72002D Fracture of unspecified part of neck of left femur, subsequent encounter for closed fracture with routine healing: Secondary | ICD-10-CM | POA: Diagnosis not present

## 2021-02-23 DIAGNOSIS — I132 Hypertensive heart and chronic kidney disease with heart failure and with stage 5 chronic kidney disease, or end stage renal disease: Secondary | ICD-10-CM | POA: Diagnosis not present

## 2021-02-23 DIAGNOSIS — I5033 Acute on chronic diastolic (congestive) heart failure: Secondary | ICD-10-CM | POA: Diagnosis not present

## 2021-02-23 DIAGNOSIS — R1319 Other dysphagia: Secondary | ICD-10-CM | POA: Diagnosis not present

## 2021-02-23 DIAGNOSIS — N186 End stage renal disease: Secondary | ICD-10-CM | POA: Diagnosis not present

## 2021-02-23 DIAGNOSIS — K219 Gastro-esophageal reflux disease without esophagitis: Secondary | ICD-10-CM | POA: Diagnosis not present

## 2021-02-23 DIAGNOSIS — J9622 Acute and chronic respiratory failure with hypercapnia: Secondary | ICD-10-CM | POA: Diagnosis not present

## 2021-02-23 DIAGNOSIS — M1990 Unspecified osteoarthritis, unspecified site: Secondary | ICD-10-CM | POA: Diagnosis not present

## 2021-02-23 DIAGNOSIS — I2510 Atherosclerotic heart disease of native coronary artery without angina pectoris: Secondary | ICD-10-CM | POA: Diagnosis not present

## 2021-02-23 DIAGNOSIS — L89152 Pressure ulcer of sacral region, stage 2: Secondary | ICD-10-CM | POA: Diagnosis not present

## 2021-02-23 DIAGNOSIS — R0689 Other abnormalities of breathing: Secondary | ICD-10-CM

## 2021-02-23 DIAGNOSIS — G4730 Sleep apnea, unspecified: Secondary | ICD-10-CM

## 2021-02-23 DIAGNOSIS — G4733 Obstructive sleep apnea (adult) (pediatric): Secondary | ICD-10-CM | POA: Diagnosis not present

## 2021-02-23 DIAGNOSIS — I701 Atherosclerosis of renal artery: Secondary | ICD-10-CM | POA: Diagnosis not present

## 2021-02-23 DIAGNOSIS — E559 Vitamin D deficiency, unspecified: Secondary | ICD-10-CM | POA: Diagnosis not present

## 2021-02-23 NOTE — Telephone Encounter (Signed)
   [facility] Pre-operative Risk Assessment    Patient Name: TRYSTAN EADS  DOB: Feb 06, 1951 MRN: [id number]  HEARTCARE STAFF:  - IMPORTANT!!!!!! Under Visit Info/Reason for Call, type in Other and utilize the format Clearance MM/DD/YY or Clearance TBD. Do not use dashes or single digits. - Please review there is not already an duplicate clearance open for this procedure. - If request is for dental extraction, please clarify the # of teeth to be extracted. - If the patient is currently at the dentist's office, call Pre-Op Callback Staff (MA/nurse) to input urgent request.  - If the patient is not currently in the dentist office, please route to the Pre-Op pool.  Request for surgical clearance:  What type of surgery is being performed? Cystoscopy ureteroscopy laser lithotripsy with stent exchange  When is this surgery scheduled? TBD  What type of clearance is required (medical clearance vs. Pharmacy clearance to hold med vs. Both)? both  Are there any medications that need to be held prior to surgery and how long? Eliquis needs to be held 3 days prior  Practice name and name of physician performing surgery? [facility]/ Dr. Rexene Alberts  What is the office phone number? [place].   What is the office fax number? [phone]  8.   Anesthesia type (None, local, MAC, general) ? General    Ermelinda Das 02/23/2021, 9:28 AM  _________________________________________________________________   (provider comments below)

## 2021-02-23 NOTE — Telephone Encounter (Signed)
   Name: Molly [name]  DOB: Mar 01, 1951  MRN: [id number]  Primary Cardiologist: Jenne Campus, MD  Chart reviewed as part of pre-operative protocol coverage. Patient recently had a prolonged for acute on chronic respiratory failure and acute on chronic CHF. She now showed her office visit on 01/20/2021 following this.  Because of [name]'s past medical history and time since last visit, she will require a follow-up visit in order to better assess preoperative cardiovascular risk.  Pre-op covering staff: - Please schedule appointment and call patient to inform them. If patient already had an upcoming appointment within acceptable timeframe, please add "pre-op clearance" to the appointment notes so provider is aware. - Please contact requesting surgeon's office via preferred method (i.e, phone, fax) to inform them of need for appointment prior to surgery.  This message will also be routed to pharmacy pool for input on holding anticoagulant agent as requested below so that this information is available to the clearing provider at time of patient's appointment. Pharmacy, looks like patient is on Eliquis for atrial fibrillation. Can you please comment on how long this can be held for upcoming procedure? Thank you!  Will remove from pre-op pool.   Darreld Mclean, PA-C  02/23/2021, 11:26 AM '

## 2021-02-23 NOTE — Progress Notes (Signed)
Subjective:  Patient ID: Molly [name], female    DOB: 12-Nov-1950,  MRN: [id number]  Chief Complaint  Patient presents with   Foot Pain    Bilateral foot pain due to pressure injuries from hospital     70 y.o. female presents with the above complaint.  Patient presents with complaint of left heel pressure injury.  Patient states she was hospitalized for 18 days and then never tossed or turned or offloading her heels.  She is a diabetic with last A1c of 8.2.  She states that hurts a lot because of the open sore.  They wanted to discuss treatment options have been putting anything for it.  She is known to Dr. Adah Perl and was sent over for me to treat for ulceration.  She denies doing anything else besides offloading at home.   Review of Systems: Negative except as noted in the HPI. Denies N/V/F/Ch.  Past Medical History:  Diagnosis Date   Abdominal aortic atherosclerosis with stenosis 06/29/2015   AKI (acute kidney injury) ([place]) 06/29/2015   Atherosclerosis of coronary artery without angina pectoris 04/21/2020   Atopic dermatitis 04/21/2020   Benign hypertension with CKD (chronic kidney disease) stage III ([place]) 04/21/2020   Bilateral impacted cerumen 08/21/2019   Chest pain 08/31/2020   Cholecystitis 06/29/2015   Cholelithiasis 06/29/2015   Claudication in peripheral vascular disease ([place]) 08/28/2019   Peripheral arterial disease   Colon cancer screening 04/21/2020   Dehydration with hyponatremia 06/29/2015   Diabetes mellitus ([place]) 07/15/2018   Diarrhea 04/21/2020   DKA (diabetic ketoacidoses) 06/29/2015   Dyslipidemia 08/30/2018   Elevated troponin    Epigastric pain 04/21/2020   Gastroesophageal reflux disease 04/21/2020   HTN (hypertension) 06/29/2015   Hyperglycemia due to type 2 diabetes mellitus ([place]) 04/21/2020   Hypertension    Hypertensive emergency 07/12/2020   Hypertensive heart disease without congestive heart failure 04/21/2020   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   Proptosis 04/21/2020   Proximal leg weakness 09/10/2018   Ptosis of left eyelid 09/10/2018   Renal artery stenosis (HCC) 12/17/2019   Renal artery stenosis   Standard chest x-ray abnormal 04/21/2020   Vitamin D deficiency 04/21/2020   Weight loss 04/21/2020    Current Outpatient Medications:    acetaminophen (TYLENOL) 325 MG tablet, Take 2 tablets (650 mg total) by mouth every 4 (four) hours as needed for headache or mild pain., Disp: , Rfl:    amiodarone (PACERONE) 200 MG tablet, Take 1 tablet (200 mg total) by mouth daily for 7 days., Disp: 7 tablet, Rfl: 0   apixaban (ELIQUIS) 5 MG TABS tablet, Take 1 tablet (5 mg total) by mouth 2 (two) times daily., Disp: 60 tablet, Rfl: 0   Cholecalciferol 25 MCG (1000 UT) tablet, Take 2,000 Units by mouth daily., Disp: , Rfl:    cilostazol (PLETAL) 50 MG tablet, Take 1 tablet (50 mg total) by mouth 2 (two) times daily., Disp: 180 tablet, Rfl: 3   dronabinol (  MARINOL) 2.5 MG capsule, Take 1 capsule (2.5 mg total) by mouth daily before lunch., Disp: 30 capsule, Rfl: 0   ezetimibe (ZETIA) 10 MG tablet, Take 1 tablet (10 mg total) by mouth daily., Disp: 90 tablet, Rfl: 2   famotidine (PEPCID) 20 MG tablet, Take 20 mg by mouth in the morning and at bedtime., Disp: , Rfl:    FLUoxetine (PROZAC) 10 MG capsule, Take 1 capsule (10 mg total) by mouth daily., Disp: 30 capsule, Rfl: 0   hydrocortisone cream 1 %, Apply 1 application topically 4 (four)  times daily as needed for itching., Disp: , Rfl:    isosorbide mononitrate (IMDUR) 30 MG 24 hr tablet, Take 1 tablet (30 mg total) by mouth daily., Disp: 30 tablet, Rfl: 0   ketoconazole (NIZORAL) 2 % cream, Apply 1 application topically daily as needed for irritation. , Disp: , Rfl:    levocetirizine (XYZAL) 5 MG tablet, Take 5 mg by mouth daily as needed for allergies., Disp: , Rfl:    loperamide (IMODIUM A-D) 2 MG tablet, Take 2 mg by mouth 4 (four) times daily as needed for diarrhea or loose stools., Disp: , Rfl:    metoprolol succinate (TOPROL-XL) 50 MG 24 hr tablet, Take 1 tablet (50 mg total) by mouth daily. Take with or immediately following a meal., Disp: 30 tablet, Rfl: 0   rosuvastatin (CRESTOR) 10 MG tablet, Take 1 tablet (10 mg total) by mouth daily., Disp: 30 tablet, Rfl: 0  Social History   Tobacco Use  Smoking Status Never  Smokeless Tobacco Never    Allergies  Allergen Reactions   Lisinopril Other (See Comments)   Mercury Nausea And Vomiting   Objective:  There were no vitals filed for this visit. There is no height or weight on file to calculate BMI. Constitutional Well developed. Well nourished.  Vascular Dorsalis pedis pulses palpable bilaterally. Posterior tibial pulses palpable bilaterally. Capillary refill normal to all digits.  No cyanosis or clubbing noted. Pedal hair growth normal.  Neurologic Normal speech. Oriented to person, place, and time. Epicritic sensation to light touch grossly present bilaterally.  Dermatologic Left heel pressure injury/pressure sore superficial in nature.  Does not probe down to deep tissue.  No malodor present.  No redness noted.  Orthopedic: Normal joint ROM without pain or crepitus bilaterally. No visible deformities. No bony tenderness.   Radiographs: None Assessment:   1. Type II diabetes mellitus with peripheral circulatory disorder (HCC)   2. Pressure injury of left heel, stage 1    Plan:  Patient was evaluated  and treated and all questions answered.  Left heel pressure injury/ulcer -I explained to the patient the etiology of injury and various treatment options were extensively discussed.  I encouraged the patient to continue offloading with offloading pads and heels.  I will place in a cam boot as well.  She would need to do Betadine wet-to-dry dressing changes to allow her to heal appropriately.  This is a superficial injury for now however he can progressively get worse.  Unable to appreciate the actual depth of the injury. -Continue offloading -Betadine wet-to-dry dressing  No follow-ups on file.

## 2021-02-23 NOTE — Telephone Encounter (Signed)
S/w the pt and she is agreeable to reschedule appt for pre op clearance. Pt has been rescheduled to see Melina Copa, [facility] 03/09/21 @ 8:50. I will forward notes to [facility] for upcoming appt. Will send FYI to requesting office pt has appt 03/09/21.

## 2021-02-24 DIAGNOSIS — Z992 Dependence on renal dialysis: Secondary | ICD-10-CM | POA: Diagnosis not present

## 2021-02-24 DIAGNOSIS — N2581 Secondary hyperparathyroidism of renal origin: Secondary | ICD-10-CM | POA: Diagnosis not present

## 2021-02-24 DIAGNOSIS — E876 Hypokalemia: Secondary | ICD-10-CM | POA: Diagnosis not present

## 2021-02-24 DIAGNOSIS — D509 Iron deficiency anemia, unspecified: Secondary | ICD-10-CM | POA: Diagnosis not present

## 2021-02-24 DIAGNOSIS — T8249XD Other complication of vascular dialysis catheter, subsequent encounter: Secondary | ICD-10-CM | POA: Diagnosis not present

## 2021-02-24 DIAGNOSIS — D631 Anemia in chronic kidney disease: Secondary | ICD-10-CM | POA: Diagnosis not present

## 2021-02-24 DIAGNOSIS — I120 Hypertensive chronic kidney disease with stage 5 chronic kidney disease or end stage renal disease: Secondary | ICD-10-CM | POA: Diagnosis not present

## 2021-02-24 DIAGNOSIS — N186 End stage renal disease: Secondary | ICD-10-CM | POA: Diagnosis not present

## 2021-02-24 NOTE — Telephone Encounter (Signed)
Patient with diagnosis of A Fib on Eliquis for anticoagulation.    Procedure: Cystoscopy ureteroscopy laser lithotripsy  Date of procedure: TBD   CHA2DS2-VASc Score = 6  This indicates a 9.7% annual risk of stroke. The patient's score is based upon: CHF History: 1 HTN History: 1 Diabetes History: 1 Stroke History: 0 Vascular Disease History: 1 Age Score: 1 Gender Score: 1    CrCl 19 mL/min Platelet count 308K   Per office protocol, patient can hold Eliquis for 3 days prior to procedure.

## 2021-02-25 DIAGNOSIS — E559 Vitamin D deficiency, unspecified: Secondary | ICD-10-CM | POA: Diagnosis not present

## 2021-02-25 DIAGNOSIS — J9621 Acute and chronic respiratory failure with hypoxia: Secondary | ICD-10-CM | POA: Diagnosis not present

## 2021-02-25 DIAGNOSIS — M1990 Unspecified osteoarthritis, unspecified site: Secondary | ICD-10-CM | POA: Diagnosis not present

## 2021-02-25 DIAGNOSIS — E785 Hyperlipidemia, unspecified: Secondary | ICD-10-CM | POA: Diagnosis not present

## 2021-02-25 DIAGNOSIS — R1319 Other dysphagia: Secondary | ICD-10-CM | POA: Diagnosis not present

## 2021-02-25 DIAGNOSIS — I701 Atherosclerosis of renal artery: Secondary | ICD-10-CM | POA: Diagnosis not present

## 2021-02-25 DIAGNOSIS — I2510 Atherosclerotic heart disease of native coronary artery without angina pectoris: Secondary | ICD-10-CM | POA: Diagnosis not present

## 2021-02-25 DIAGNOSIS — K589 Irritable bowel syndrome without diarrhea: Secondary | ICD-10-CM | POA: Diagnosis not present

## 2021-02-25 DIAGNOSIS — D631 Anemia in chronic kidney disease: Secondary | ICD-10-CM | POA: Diagnosis not present

## 2021-02-25 DIAGNOSIS — Z993 Dependence on wheelchair: Secondary | ICD-10-CM | POA: Diagnosis not present

## 2021-02-25 DIAGNOSIS — J9622 Acute and chronic respiratory failure with hypercapnia: Secondary | ICD-10-CM | POA: Diagnosis not present

## 2021-02-25 DIAGNOSIS — Z794 Long term (current) use of insulin: Secondary | ICD-10-CM | POA: Diagnosis not present

## 2021-02-25 DIAGNOSIS — S72002D Fracture of unspecified part of neck of left femur, subsequent encounter for closed fracture with routine healing: Secondary | ICD-10-CM | POA: Diagnosis not present

## 2021-02-25 DIAGNOSIS — I132 Hypertensive heart and chronic kidney disease with heart failure and with stage 5 chronic kidney disease, or end stage renal disease: Secondary | ICD-10-CM | POA: Diagnosis not present

## 2021-02-25 DIAGNOSIS — I051 Rheumatic mitral insufficiency: Secondary | ICD-10-CM | POA: Diagnosis not present

## 2021-02-25 DIAGNOSIS — K219 Gastro-esophageal reflux disease without esophagitis: Secondary | ICD-10-CM | POA: Diagnosis not present

## 2021-02-25 DIAGNOSIS — I6523 Occlusion and stenosis of bilateral carotid arteries: Secondary | ICD-10-CM | POA: Diagnosis not present

## 2021-02-25 DIAGNOSIS — I5033 Acute on chronic diastolic (congestive) heart failure: Secondary | ICD-10-CM | POA: Diagnosis not present

## 2021-02-25 DIAGNOSIS — N186 End stage renal disease: Secondary | ICD-10-CM | POA: Diagnosis not present

## 2021-02-25 DIAGNOSIS — Z992 Dependence on renal dialysis: Secondary | ICD-10-CM | POA: Diagnosis not present

## 2021-02-25 DIAGNOSIS — L89152 Pressure ulcer of sacral region, stage 2: Secondary | ICD-10-CM | POA: Diagnosis not present

## 2021-02-25 DIAGNOSIS — E1151 Type 2 diabetes mellitus with diabetic peripheral angiopathy without gangrene: Secondary | ICD-10-CM | POA: Diagnosis not present

## 2021-02-25 DIAGNOSIS — E1122 Type 2 diabetes mellitus with diabetic chronic kidney disease: Secondary | ICD-10-CM | POA: Diagnosis not present

## 2021-02-25 DIAGNOSIS — M8580 Other specified disorders of bone density and structure, unspecified site: Secondary | ICD-10-CM | POA: Diagnosis not present

## 2021-02-26 DIAGNOSIS — D631 Anemia in chronic kidney disease: Secondary | ICD-10-CM | POA: Diagnosis not present

## 2021-02-26 DIAGNOSIS — D509 Iron deficiency anemia, unspecified: Secondary | ICD-10-CM | POA: Diagnosis not present

## 2021-02-26 DIAGNOSIS — N2581 Secondary hyperparathyroidism of renal origin: Secondary | ICD-10-CM | POA: Diagnosis not present

## 2021-02-26 DIAGNOSIS — I120 Hypertensive chronic kidney disease with stage 5 chronic kidney disease or end stage renal disease: Secondary | ICD-10-CM | POA: Diagnosis not present

## 2021-02-26 DIAGNOSIS — N186 End stage renal disease: Secondary | ICD-10-CM | POA: Diagnosis not present

## 2021-02-26 DIAGNOSIS — Z992 Dependence on renal dialysis: Secondary | ICD-10-CM | POA: Diagnosis not present

## 2021-02-26 DIAGNOSIS — T8249XD Other complication of vascular dialysis catheter, subsequent encounter: Secondary | ICD-10-CM | POA: Diagnosis not present

## 2021-02-26 DIAGNOSIS — E876 Hypokalemia: Secondary | ICD-10-CM | POA: Diagnosis not present

## 2021-02-28 ENCOUNTER — Ambulatory Visit: Payer: Medicare Other | Admitting: Podiatry

## 2021-02-28 ENCOUNTER — Telehealth: Payer: Self-pay

## 2021-02-28 ENCOUNTER — Other Ambulatory Visit: Payer: Self-pay

## 2021-02-28 DIAGNOSIS — Z794 Long term (current) use of insulin: Secondary | ICD-10-CM | POA: Diagnosis not present

## 2021-02-28 DIAGNOSIS — I6523 Occlusion and stenosis of bilateral carotid arteries: Secondary | ICD-10-CM | POA: Diagnosis not present

## 2021-02-28 DIAGNOSIS — L89152 Pressure ulcer of sacral region, stage 2: Secondary | ICD-10-CM | POA: Diagnosis not present

## 2021-02-28 DIAGNOSIS — E1122 Type 2 diabetes mellitus with diabetic chronic kidney disease: Secondary | ICD-10-CM | POA: Diagnosis not present

## 2021-02-28 DIAGNOSIS — M1990 Unspecified osteoarthritis, unspecified site: Secondary | ICD-10-CM | POA: Diagnosis not present

## 2021-02-28 DIAGNOSIS — E1151 Type 2 diabetes mellitus with diabetic peripheral angiopathy without gangrene: Secondary | ICD-10-CM | POA: Diagnosis not present

## 2021-02-28 DIAGNOSIS — M8580 Other specified disorders of bone density and structure, unspecified site: Secondary | ICD-10-CM | POA: Diagnosis not present

## 2021-02-28 DIAGNOSIS — M25552 Pain in left hip: Secondary | ICD-10-CM | POA: Diagnosis not present

## 2021-02-28 DIAGNOSIS — I739 Peripheral vascular disease, unspecified: Secondary | ICD-10-CM

## 2021-02-28 DIAGNOSIS — S72002D Fracture of unspecified part of neck of left femur, subsequent encounter for closed fracture with routine healing: Secondary | ICD-10-CM | POA: Diagnosis not present

## 2021-02-28 DIAGNOSIS — I132 Hypertensive heart and chronic kidney disease with heart failure and with stage 5 chronic kidney disease, or end stage renal disease: Secondary | ICD-10-CM | POA: Diagnosis not present

## 2021-02-28 DIAGNOSIS — N186 End stage renal disease: Secondary | ICD-10-CM | POA: Diagnosis not present

## 2021-02-28 DIAGNOSIS — I5033 Acute on chronic diastolic (congestive) heart failure: Secondary | ICD-10-CM | POA: Diagnosis not present

## 2021-02-28 DIAGNOSIS — K219 Gastro-esophageal reflux disease without esophagitis: Secondary | ICD-10-CM | POA: Diagnosis not present

## 2021-02-28 DIAGNOSIS — E559 Vitamin D deficiency, unspecified: Secondary | ICD-10-CM | POA: Diagnosis not present

## 2021-02-28 DIAGNOSIS — E785 Hyperlipidemia, unspecified: Secondary | ICD-10-CM | POA: Diagnosis not present

## 2021-02-28 DIAGNOSIS — I701 Atherosclerosis of renal artery: Secondary | ICD-10-CM | POA: Diagnosis not present

## 2021-02-28 DIAGNOSIS — J9621 Acute and chronic respiratory failure with hypoxia: Secondary | ICD-10-CM | POA: Diagnosis not present

## 2021-02-28 DIAGNOSIS — I2510 Atherosclerotic heart disease of native coronary artery without angina pectoris: Secondary | ICD-10-CM | POA: Diagnosis not present

## 2021-02-28 DIAGNOSIS — Z993 Dependence on wheelchair: Secondary | ICD-10-CM | POA: Diagnosis not present

## 2021-02-28 DIAGNOSIS — K589 Irritable bowel syndrome without diarrhea: Secondary | ICD-10-CM | POA: Diagnosis not present

## 2021-02-28 DIAGNOSIS — I051 Rheumatic mitral insufficiency: Secondary | ICD-10-CM | POA: Diagnosis not present

## 2021-02-28 DIAGNOSIS — J9622 Acute and chronic respiratory failure with hypercapnia: Secondary | ICD-10-CM | POA: Diagnosis not present

## 2021-02-28 DIAGNOSIS — R1319 Other dysphagia: Secondary | ICD-10-CM | POA: Diagnosis not present

## 2021-02-28 DIAGNOSIS — D631 Anemia in chronic kidney disease: Secondary | ICD-10-CM | POA: Diagnosis not present

## 2021-02-28 DIAGNOSIS — Z992 Dependence on renal dialysis: Secondary | ICD-10-CM | POA: Diagnosis not present

## 2021-02-28 NOTE — Progress Notes (Signed)
[facility] [place]  ASSESSMENT / PLAN: Molly [name] is a 70 y.o. female with atherosclerosis of native arteries of left lower extremity causing ulceration.  Patient counseled patients with chronic limb threatening ischemia have an annual risk of cardiovascular mortality of 25% and a high risk of amputation.   Recommend the following which can slow the progression of atherosclerosis and reduce the risk of major adverse cardiac / limb events:  Complete cessation from all tobacco products. Blood glucose control with goal A1c < 7%. Blood pressure control with goal blood pressure < 140/90 mmHg. Lipid reduction therapy with goal LDL-C <100 mg/dL (<70 if symptomatic from PAD).  Aspirin 81mg  PO QD.  Atorvastatin 40-80mg  PO QD (or other "high intensity" statin therapy).  Plan left lower extremity angiogram with possible intervention via right common femoral artery approach in cath lab Friday 03/04/21.  CHIEF COMPLAINT: left foot / heel ulceration  HISTORY OF PRESENT ILLNESS: Molly [name] is a 70 y.o. female well known to me for hemodialysis access surgery. The patient is referred to clinic by Dr. Lucia Gaskins for evaluation of left foot ischemic ulceration.  The patient had a prolonged hospitalization 12/27/2020 to 01/17/2021.  She suffered a fall in hospital which required operative fixation of a broken hip.  She developed sacral and heel decubitus ulceration.  She is also developed left great toe ulceration.  Prior to her hospitalization, the patient was active and ambulatory.  She denies any history of claudication or ischemic rest pain prior to hospitalization.  Past Medical History:  Diagnosis Date   Abdominal aortic atherosclerosis with stenosis 06/29/2015   AKI (acute kidney injury) ([place]) 06/29/2015   Atherosclerosis of coronary artery without angina pectoris 04/21/2020   Atopic dermatitis 04/21/2020   Benign hypertension with CKD (chronic kidney disease) stage III ([place]) 04/21/2020    Bilateral impacted cerumen 08/21/2019   Chest pain 08/31/2020   Cholecystitis 06/29/2015   Cholelithiasis 06/29/2015   Claudication in peripheral vascular disease ([place]) 08/28/2019   Peripheral arterial disease   Colon cancer screening 04/21/2020   Dehydration with hyponatremia 06/29/2015   Diabetes mellitus ([place]) 07/15/2018   Diarrhea 04/21/2020   DKA (diabetic ketoacidoses) 06/29/2015   Dyslipidemia 08/30/2018   Elevated troponin    Epigastric pain 04/21/2020   Gastroesophageal reflux disease 04/21/2020   HTN (hypertension) 06/29/2015   Hyperglycemia due to type 2 diabetes mellitus ([place]) 04/21/2020   Hypertension    Hypertensive emergency 07/12/2020   Hypertensive heart disease without congestive heart failure 04/21/2020   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   Proptosis 04/21/2020   Proximal leg weakness 09/10/2018   Ptosis of left eyelid 09/10/2018   Renal artery stenosis ([place]) 12/17/2019   Renal artery stenosis   Standard chest x-ray abnormal 04/21/2020   Vitamin D deficiency 04/21/2020   Weight loss 04/21/2020    Past Surgical History:  Procedure Laterality Date   ABDOMINAL AORTOGRAM W/LOWER EXTREMITY N/A 08/28/2019   Procedure: ABDOMINAL AORTOGRAM W/LOWER EXTREMITY;  Surgeon: Lorretta Harp, MD;  Location: [place] [facility] [facility];  Service: Cardiovascular;  Laterality: N/A;   AV FISTULA PLACEMENT Left 01/10/2021   Procedure: LEFT ARM BRACHIOCEPHALIC  ARTERIOVENOUS  (AV) FISTULA CREATION;  Surgeon: Cherre Robins, MD;  Location: [place];  Service: Vascular;  Laterality: Left;   BIOPSY  01/05/2021   Procedure: BIOPSY;  Surgeon: Gatha Mayer, MD;  Location: [place];  Service: Endoscopy;;   CATARACT EXTRACTION, BILATERAL     CHOLECYSTECTOMY N/A 06/30/2015   Procedure: LAPAROSCOPIC CHOLECYSTECTOMY WITH INTRAOPERATIVE CHOLANGIOGRAM;  Surgeon: Donnie Mesa, MD;  Location: [facility];  Service: General;  Laterality: N/A;   CYSTOSCOPY W/ URETERAL STENT PLACEMENT Left 12/30/2020   Procedure: CYSTOSCOPY WITH RETROGRADE PYELOGRAM/URETERAL STENT PLACEMENT;  Surgeon: Janith Lima, MD;  Location: [place];  Service: Urology;  Laterality: Left;   ESOPHAGOGASTRODUODENOSCOPY (EGD) WITH PROPOFOL N/A 01/05/2021   Procedure: ESOPHAGOGASTRODUODENOSCOPY (EGD) WITH PROPOFOL;  Surgeon: Gatha Mayer, MD;  Location: [place];  Service: Endoscopy;  Laterality: N/A;   INSERTION OF DIALYSIS CATHETER N/A 01/10/2021   Procedure: INSERTION OF TUNNELED DIALYSIS CATHETER RIGHT INTERNAL JUGULAR;  Surgeon: Cherre Robins, MD;  Location: [facility];  Service: Vascular;  Laterality: N/A;   INTRAMEDULLARY (IM) NAIL INTERTROCHANTERIC Left 01/06/2021   Procedure: INTRAMEDULLARY (IM) NAIL INTERTROCHANTRIC;  Surgeon: Erle Crocker, MD;  Location: [place];  Service: Orthopedics;  Laterality: Left;   IR FLUORO GUIDE CV LINE RIGHT  01/01/2021   IR US GUIDE VASC ACCESS RIGHT  01/01/2021   PERIPHERAL VASCULAR BALLOON ANGIOPLASTY Right 08/28/2019   Procedure: PERIPHERAL VASCULAR BALLOON ANGIOPLASTY;  Surgeon: Lorretta Harp, MD;  Location: [facility];  Service: Cardiovascular;  Laterality: Right;  attempted SFA   REMOVAL OF A DIALYSIS CATHETER Right 01/10/2021   Procedure: REMOVAL OF TEMPORARY DIALYSIS CATHETER RIGHT GROIN;  Surgeon: Cherre Robins, MD;  Location: [facility];  Service: Vascular;  Laterality: Right;   THROMBECTOMY BRACHIAL ARTERY  01/10/2021   Procedure: THROMBECTOMY LEFT CEPHALIC  VEIN;  Surgeon: Cherre Robins, MD;  Location: [facility];  Service: Vascular;;    Family History  Problem Relation Age of Onset   Breast cancer Mother    Hypertension Mother    Hypertension Father    Pancreatic cancer Other        uncle    Social History   Socioeconomic History   Marital status: Married    Spouse name: Lexee Brashears   Number of children: 2   Years of education: Not on file   Highest education level: Bachelor's degree (e.g., BA, AB, BS)  Occupational History   Occupation: retired    Comment: Med Tech  Tobacco Use   Smoking status: Never   Smokeless tobacco: Never  Vaping Use   Vaping Use: Never used  Substance and Sexual Activity   Alcohol use: No   Drug use: Never   Sexual activity: Not on file  Other Topics Concern   Not on file  Social History Narrative   Not on file   Social Determinants of Health   Financial Resource Strain: Low Risk    Difficulty of Paying Living Expenses: Not very hard  Food Insecurity: No Food Insecurity   Worried About Charity fundraiser in the Last Year: Never true   Ran Out of Food in the Last Year: Never true  Transportation Needs: No Transportation Needs   Lack of Transportation (Medical): No   Lack of Transportation (Non-Medical): No  Physical Activity: Not  on file  Stress: Not on file  Social Connections: Not on file  Intimate Partner Violence: Not on file    Allergies  Allergen Reactions   Lisinopril Other (See Comments)   Mercury Nausea And Vomiting    Current Outpatient Medications  Medication Sig Dispense Refill   acetaminophen (TYLENOL) 325 MG tablet Take 2 tablets (650 mg total) by mouth every 4 (four) hours as needed for headache or mild pain.     apixaban (ELIQUIS) 5 MG TABS tablet Take 1 tablet (5 mg total) by mouth 2 (two) times daily. 60 tablet 0   Cholecalciferol 25 MCG (1000 UT) tablet Take 2,000 Units by mouth daily.     cilostazol (PLETAL) 50 MG tablet Take 1 tablet (50 mg total) by mouth 2 (two)  times daily. 180 tablet 3   dronabinol (MARINOL) 2.5 MG capsule Take 1 capsule (2.5 mg total) by mouth daily before lunch. 30 capsule 0   ezetimibe (ZETIA) 10 MG tablet Take 1 tablet (10 mg total) by mouth daily. 90 tablet 2   famotidine (PEPCID) 20 MG tablet Take 20 mg by mouth in the morning and at bedtime.     FLUoxetine (PROZAC) 10 MG capsule Take 1 capsule (10 mg total) by mouth daily. 30 capsule 0   hydrocortisone cream 1 % Apply 1 application topically 4 (four) times daily as needed for itching.     isosorbide mononitrate (IMDUR) 30 MG 24 hr tablet Take 1 tablet (30 mg total) by mouth daily. 30 tablet 0   ketoconazole (NIZORAL) 2 % cream Apply 1 application topically daily as needed for irritation.      levocetirizine (XYZAL) 5 MG tablet Take 5 mg by mouth daily as needed for allergies.     loperamide (IMODIUM A-D) 2 MG tablet Take 2 mg by mouth 4 (four) times daily as needed for diarrhea or loose stools.     metoprolol succinate (TOPROL-XL) 50 MG 24 hr tablet Take 1 tablet (50 mg total) by mouth daily. Take with or immediately following a meal. 30 tablet 0   rosuvastatin (CRESTOR) 10 MG tablet Take 1 tablet (10 mg total) by mouth daily. 30 tablet 0   amiodarone (PACERONE) 200 MG tablet Take 1 tablet (200 mg total) by mouth daily for 7 days. 7 tablet 0   No current facility-administered medications for this visit.    REVIEW OF SYSTEMS:  [X]  denotes positive finding, [ ]  denotes negative finding Cardiac  Comments:  Chest pain or chest pressure:    Shortness of breath upon exertion:    Short of breath when lying flat:    Irregular heart rhythm:        Vascular    Pain in calf, thigh, or hip brought on by ambulation:    Pain in feet at night that wakes you up from your sleep:     Blood clot in your veins:    Leg swelling:         Pulmonary    Oxygen at home:    Productive cough:     Wheezing:         Neurologic    Sudden weakness in arms or legs:     Sudden numbness in arms  or legs:     Sudden onset of difficulty speaking or slurred speech:    Temporary loss of vision in one eye:     Problems with dizziness:         Gastrointestinal    Blood in stool:  Vomited blood:         Genitourinary    Burning when urinating:     Blood in urine:        Psychiatric    Major depression:         Hematologic    Bleeding problems:    Problems with blood clotting too easily:        Skin    Rashes or ulcers:        Constitutional    Fever or chills:      PHYSICAL EXAM Vitals:   03/01/21 1600  BP: (!) 199/82  Pulse: 75  Resp: 20  Temp: (!) 97.5 F (36.4 C)  SpO2: 93%  Weight: 138 lb (62.6 kg)  Height: 5\' 2"  (1.575 m)    Constitutional: chronically ill appearing. no distress. Appears well nourished.  Neurologic: CN intact. no focal findings. no sensory loss. Psychiatric:  [name] and affect symmetric and appropriate. Eyes:  No icterus. No conjunctival pallor. Ears, nose, throat:  mucous membranes moist. Midline trachea.  Cardiac: regular rate and rhythm.  Respiratory:  unlabored. Abdominal:  soft, non-tender, non-distended.  Peripheral vascular: no palpable pedal pulses. No palpable popliteal pulses Extremity: no edema. no cyanosis. no pallor.  Skin: no gangrene. + ulceration about the left heel and great toe.  Lymphatic: no Stemmer's sign. no palpable lymphadenopathy.  PERTINENT LABORATORY AND RADIOLOGIC DATA  Most recent CBC CBC Latest Ref Rng & Units 01/17/2021 01/16/2021 01/15/2021  WBC 4.0 - 10.5 K/uL 9.8 10.5 9.5  Hemoglobin 12.0 - 15.0 g/dL 8.2(L) 8.0(L) 8.4(L)  Hematocrit 36.0 - 46.0 % 25.9(L) 25.0(L) 26.7(L)  Platelets 150 - 400 K/uL 308 273 313     Most recent CMP CMP Latest Ref Rng & Units 01/17/2021 01/16/2021 01/15/2021  Glucose 70 - 99 mg/dL 122(H) 131(H) 113(H)  BUN 8 - 23 mg/dL 22 9 23   Creatinine 0.44 - 1.00 mg/dL 2.78(H) 1.87(H) 2.90(H)  Sodium 135 - 145 mmol/L 132(L) 134(L) 133(L)  Potassium 3.5 - 5.1 mmol/L 3.7 3.2(L) 3.7   Chloride 98 - 111 mmol/L 99 99 99  CO2 22 - 32 mmol/L 25 26 24   Calcium 8.9 - 10.3 mg/dL 7.7(L) 7.7(L) 7.9(L)  Total Protein 6.5 - 8.1 g/dL - - -  Total Bilirubin 0.3 - 1.2 mg/dL - - -  Alkaline Phos 38 - 126 U/L - - -  AST 15 - 41 U/L - - -  ALT 0 - 44 U/L - - -    Renal function CrCl cannot be calculated (Patient's most recent lab result is older than the maximum 21 days allowed.).  Hgb A1c MFr Bld (%)  Date Value  11/27/2020 8.2 (H)    LDL Chol Calc (NIH)  Date Value Ref Range Status  12/10/2019 46 0 - 99 mg/dL Final   LDL Cholesterol  Date Value Ref Range Status  08/31/2020 107 (H) 0 - 99 mg/dL Final    Comment:           Total Cholesterol/HDL:CHD Risk Coronary Heart Disease Risk Table                     Men   Women  1/2 Average Risk   3.4   3.3  Average Risk       5.0   4.4  2 X Average Risk   9.6   7.1  3 X Average Risk  23.4   11.0        Use the calculated Patient  Ratio above and the CHD Risk Table to determine the patient's CHD Risk.        ATP III CLASSIFICATION (LDL):  <100     mg/dL   Optimal  100-129  mg/dL   Near or Above                    Optimal  130-159  mg/dL   Borderline  160-189  mg/dL   High  >190     mg/dL   Very High Performed at [facility] [address] [place], [place] [address]      +-------+-----------+-----------+------------+------------+  ABI/TBIToday's ABIToday's TBIPrevious ABIPrevious TBI  +-------+-----------+-----------+------------+------------+  Right  [place]         0.51                                 +-------+-----------+-----------+------------+------------+  Left   0.51       absent                               +-------+-----------+-----------+------------+------------+   Yevonne Aline. Stanford Breed, MD [facility] Phone Number: [phone] 03/01/2021 6:58 PM  Total time spent on preparing this encounter including chart review, data review,  collecting history, examining the patient, coordinating care for this established patient, 40 minutes.  Portions of this report may have been transcribed using voice recognition software.  Every effort has been made to ensure accuracy; however, inadvertent computerized transcription errors may still be present.

## 2021-02-28 NOTE — Telephone Encounter (Signed)
Patient calls today to report a non-healing wound on her left heel. Orthopedics would like her blood flow checked. Patient has been seen in the past for PAD. Placed on schedule for ABI and visit.

## 2021-02-28 NOTE — H&P (View-Only) (Signed)
[facility] [place]  ASSESSMENT / PLAN: Molly [name] is a 70 y.o. female with atherosclerosis of native arteries of left lower extremity causing ulceration.  Patient counseled patients with chronic limb threatening ischemia have an annual risk of cardiovascular mortality of 25% and a high risk of amputation.   Recommend the following which can slow the progression of atherosclerosis and reduce the risk of major adverse cardiac / limb events:  Complete cessation from all tobacco products. Blood glucose control with goal A1c < 7%. Blood pressure control with goal blood pressure < 140/90 mmHg. Lipid reduction therapy with goal LDL-C <100 mg/dL (<70 if symptomatic from PAD).  Aspirin 81mg  PO QD.  Atorvastatin 40-80mg  PO QD (or other "high intensity" statin therapy).  Plan left lower extremity angiogram with possible intervention via right common femoral artery approach in cath lab Friday 03/04/21.  CHIEF COMPLAINT: left foot / heel ulceration  HISTORY OF PRESENT ILLNESS: Molly [name] is a 70 y.o. female well known to me for hemodialysis access surgery. The patient is referred to clinic by Dr. Lucia Gaskins for evaluation of left foot ischemic ulceration.  The patient had a prolonged hospitalization 12/27/2020 to 01/17/2021.  She suffered a fall in hospital which required operative fixation of a broken hip.  She developed sacral and heel decubitus ulceration.  She is also developed left great toe ulceration.  Prior to her hospitalization, the patient was active and ambulatory.  She denies any history of claudication or ischemic rest pain prior to hospitalization.  Past Medical History:  Diagnosis Date   Abdominal aortic atherosclerosis with stenosis 06/29/2015   AKI (acute kidney injury) ([place]) 06/29/2015   Atherosclerosis of coronary artery without angina pectoris 04/21/2020   Atopic dermatitis 04/21/2020   Benign hypertension with CKD (chronic kidney disease) stage III ([place]) 04/21/2020    Bilateral impacted cerumen 08/21/2019   Chest pain 08/31/2020   Cholecystitis 06/29/2015   Cholelithiasis 06/29/2015   Claudication in peripheral vascular disease ([place]) 08/28/2019   Peripheral arterial disease   Colon cancer screening 04/21/2020   Dehydration with hyponatremia 06/29/2015   Diabetes mellitus ([place]) 07/15/2018   Diarrhea 04/21/2020   DKA (diabetic ketoacidoses) 06/29/2015   Dyslipidemia 08/30/2018   Elevated troponin    Epigastric pain 04/21/2020   Gastroesophageal reflux disease 04/21/2020   HTN (hypertension) 06/29/2015   Hyperglycemia due to type 2 diabetes mellitus ([place]) 04/21/2020   Hypertension    Hypertensive emergency 07/12/2020   Hypertensive heart disease without congestive heart failure 04/21/2020   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   Proptosis 04/21/2020   Proximal leg weakness 09/10/2018   Ptosis of left eyelid 09/10/2018   Renal artery stenosis ([place]) 12/17/2019   Renal artery stenosis   Standard chest x-ray abnormal 04/21/2020   Vitamin D deficiency 04/21/2020   Weight loss 04/21/2020    Past Surgical History:  Procedure Laterality Date   ABDOMINAL AORTOGRAM W/LOWER EXTREMITY N/A 08/28/2019   Procedure: ABDOMINAL AORTOGRAM W/LOWER EXTREMITY;  Surgeon: Lorretta Harp, MD;  Location: [facility];  Service: Cardiovascular;  Laterality: N/A;   AV FISTULA PLACEMENT Left 01/10/2021   Procedure: LEFT ARM BRACHIOCEPHALIC  ARTERIOVENOUS  (AV) FISTULA CREATION;  Surgeon: Cherre Robins, MD;  Location: [facility];  Service: Vascular;  Laterality: Left;   BIOPSY  01/05/2021   Procedure: BIOPSY;  Surgeon: Gatha Mayer, MD;  Location: [facility];  Service: Endoscopy;;   CATARACT EXTRACTION, BILATERAL     CHOLECYSTECTOMY N/A 06/30/2015   Procedure: LAPAROSCOPIC CHOLECYSTECTOMY WITH INTRAOPERATIVE CHOLANGIOGRAM;  Surgeon: Donnie Mesa, MD;  Location: [facility];  Service: General;  Laterality: N/A;   CYSTOSCOPY W/ URETERAL STENT PLACEMENT Left 12/30/2020   Procedure: CYSTOSCOPY WITH RETROGRADE PYELOGRAM/URETERAL STENT PLACEMENT;  Surgeon: Janith Lima, MD;  Location: [facility];  Service: Urology;  Laterality: Left;   ESOPHAGOGASTRODUODENOSCOPY (EGD) WITH PROPOFOL N/A 01/05/2021   Procedure: ESOPHAGOGASTRODUODENOSCOPY (EGD) WITH PROPOFOL;  Surgeon: Gatha Mayer, MD;  Location: [facility];  Service: Endoscopy;  Laterality: N/A;   INSERTION OF DIALYSIS CATHETER N/A 01/10/2021   Procedure: INSERTION OF TUNNELED DIALYSIS CATHETER RIGHT INTERNAL JUGULAR;  Surgeon: Cherre Robins, MD;  Location: [facility];  Service: Vascular;  Laterality: N/A;   INTRAMEDULLARY (IM) NAIL INTERTROCHANTERIC Left 01/06/2021   Procedure: INTRAMEDULLARY (IM) NAIL INTERTROCHANTRIC;  Surgeon: Erle Crocker, MD;  Location: [facility];  Service: Orthopedics;  Laterality: Left;   IR FLUORO GUIDE CV LINE RIGHT  01/01/2021   IR US GUIDE VASC ACCESS RIGHT  01/01/2021   PERIPHERAL VASCULAR BALLOON ANGIOPLASTY Right 08/28/2019   Procedure: PERIPHERAL VASCULAR BALLOON ANGIOPLASTY;  Surgeon: Lorretta Harp, MD;  Location: [facility];  Service: Cardiovascular;  Laterality: Right;  attempted SFA   REMOVAL OF A DIALYSIS CATHETER Right 01/10/2021   Procedure: REMOVAL OF TEMPORARY DIALYSIS CATHETER RIGHT GROIN;  Surgeon: Cherre Robins, MD;  Location: [facility] OR;  Service: Vascular;  Laterality: Right;   THROMBECTOMY BRACHIAL ARTERY  01/10/2021   Procedure: THROMBECTOMY LEFT CEPHALIC  VEIN;  Surgeon: Cherre Robins, MD;  Location: [facility] OR;  Service: Vascular;;    Family History  Problem Relation Age of Onset   Breast cancer Mother    Hypertension Mother    Hypertension Father    Pancreatic cancer Other        uncle    Social History   Socioeconomic History   Marital status: Married    Spouse name: Kaydance Bowie   Number of children: 2   Years of education: Not on file   Highest education level: Bachelor's degree (e.g., BA, AB, BS)  Occupational History   Occupation: retired    Comment: Med Tech  Tobacco Use   Smoking status: Never   Smokeless tobacco: Never  Vaping Use   Vaping Use: Never used  Substance and Sexual Activity   Alcohol use: No   Drug use: Never   Sexual activity: Not on file  Other Topics Concern   Not on file  Social History Narrative   Not on file   Social Determinants of Health   Financial Resource Strain: Low Risk    Difficulty of Paying Living Expenses: Not very hard  Food Insecurity: No Food Insecurity   Worried About Charity fundraiser in the Last Year: Never true   Ran Out of Food in the Last Year: Never true  Transportation Needs: No Transportation Needs   Lack of Transportation (Medical): No   Lack of Transportation (Non-Medical): No  Physical Activity: Not  on file  Stress: Not on file  Social Connections: Not on file  Intimate Partner Violence: Not on file    Allergies  Allergen Reactions   Lisinopril Other (See Comments)   Mercury Nausea And Vomiting    Current Outpatient Medications  Medication Sig Dispense Refill   acetaminophen (TYLENOL) 325 MG tablet Take 2 tablets (650 mg total) by mouth every 4 (four) hours as needed for headache or mild pain.     apixaban (ELIQUIS) 5 MG TABS tablet Take 1 tablet (5 mg total) by mouth 2 (two) times daily. 60 tablet 0   Cholecalciferol 25 MCG (1000 UT) tablet Take 2,000 Units by mouth daily.     cilostazol (PLETAL) 50 MG tablet Take 1 tablet (50 mg total) by mouth 2 (two)  times daily. 180 tablet 3   dronabinol (MARINOL) 2.5 MG capsule Take 1 capsule (2.5 mg total) by mouth daily before lunch. 30 capsule 0   ezetimibe (ZETIA) 10 MG tablet Take 1 tablet (10 mg total) by mouth daily. 90 tablet 2   famotidine (PEPCID) 20 MG tablet Take 20 mg by mouth in the morning and at bedtime.     FLUoxetine (PROZAC) 10 MG capsule Take 1 capsule (10 mg total) by mouth daily. 30 capsule 0   hydrocortisone cream 1 % Apply 1 application topically 4 (four) times daily as needed for itching.     isosorbide mononitrate (IMDUR) 30 MG 24 hr tablet Take 1 tablet (30 mg total) by mouth daily. 30 tablet 0   ketoconazole (NIZORAL) 2 % cream Apply 1 application topically daily as needed for irritation.      levocetirizine (XYZAL) 5 MG tablet Take 5 mg by mouth daily as needed for allergies.     loperamide (IMODIUM A-D) 2 MG tablet Take 2 mg by mouth 4 (four) times daily as needed for diarrhea or loose stools.     metoprolol succinate (TOPROL-XL) 50 MG 24 hr tablet Take 1 tablet (50 mg total) by mouth daily. Take with or immediately following a meal. 30 tablet 0   rosuvastatin (CRESTOR) 10 MG tablet Take 1 tablet (10 mg total) by mouth daily. 30 tablet 0   amiodarone (PACERONE) 200 MG tablet Take 1 tablet (200 mg total) by mouth daily for 7 days. 7 tablet 0   No current facility-administered medications for this visit.    REVIEW OF SYSTEMS:  [X]  denotes positive finding, [ ]  denotes negative finding Cardiac  Comments:  Chest pain or chest pressure:    Shortness of breath upon exertion:    Short of breath when lying flat:    Irregular heart rhythm:        Vascular    Pain in calf, thigh, or hip brought on by ambulation:    Pain in feet at night that wakes you up from your sleep:     Blood clot in your veins:    Leg swelling:         Pulmonary    Oxygen at home:    Productive cough:     Wheezing:         Neurologic    Sudden weakness in arms or legs:     Sudden numbness in arms  or legs:     Sudden onset of difficulty speaking or slurred speech:    Temporary loss of vision in one eye:     Problems with dizziness:         Gastrointestinal    Blood in stool:  Vomited blood:         Genitourinary    Burning when urinating:     Blood in urine:        Psychiatric    Major depression:         Hematologic    Bleeding problems:    Problems with blood clotting too easily:        Skin    Rashes or ulcers:        Constitutional    Fever or chills:      PHYSICAL EXAM Vitals:   03/01/21 1600  BP: (!) 199/82  Pulse: 75  Resp: 20  Temp: (!) 97.5 F (36.4 C)  SpO2: 93%  Weight: 138 lb (62.6 kg)  Height: 5\' 2"  (1.575 m)    Constitutional: chronically ill appearing. no distress. Appears well nourished.  Neurologic: CN intact. no focal findings. no sensory loss. Psychiatric:  Mood and affect symmetric and appropriate. Eyes:  No icterus. No conjunctival pallor. Ears, nose, throat:  mucous membranes moist. Midline trachea.  Cardiac: regular rate and rhythm.  Respiratory:  unlabored. Abdominal:  soft, non-tender, non-distended.  Peripheral vascular: no palpable pedal pulses. No palpable popliteal pulses Extremity: no edema. no cyanosis. no pallor.  Skin: no gangrene. + ulceration about the left heel and great toe.  Lymphatic: no Stemmer's sign. no palpable lymphadenopathy.  PERTINENT LABORATORY AND RADIOLOGIC DATA  Most recent CBC CBC Latest Ref Rng & Units 01/17/2021 01/16/2021 01/15/2021  WBC 4.0 - 10.5 K/uL 9.8 10.5 9.5  Hemoglobin 12.0 - 15.0 g/dL 8.2(L) 8.0(L) 8.4(L)  Hematocrit 36.0 - 46.0 % 25.9(L) 25.0(L) 26.7(L)  Platelets 150 - 400 K/uL 308 273 313     Most recent CMP CMP Latest Ref Rng & Units 01/17/2021 01/16/2021 01/15/2021  Glucose 70 - 99 mg/dL 122(H) 131(H) 113(H)  BUN 8 - 23 mg/dL 22 9 23   Creatinine 0.44 - 1.00 mg/dL 2.78(H) 1.87(H) 2.90(H)  Sodium 135 - 145 mmol/L 132(L) 134(L) 133(L)  Potassium 3.5 - 5.1 mmol/L 3.7 3.2(L) 3.7   Chloride 98 - 111 mmol/L 99 99 99  CO2 22 - 32 mmol/L 25 26 24   Calcium 8.9 - 10.3 mg/dL 7.7(L) 7.7(L) 7.9(L)  Total Protein 6.5 - 8.1 g/dL - - -  Total Bilirubin 0.3 - 1.2 mg/dL - - -  Alkaline Phos 38 - 126 U/L - - -  AST 15 - 41 U/L - - -  ALT 0 - 44 U/L - - -    Renal function CrCl cannot be calculated (Patient's most recent lab result is older than the maximum 21 days allowed.).  Hgb A1c MFr Bld (%)  Date Value  11/27/2020 8.2 (H)    LDL Chol Calc (NIH)  Date Value Ref Range Status  12/10/2019 46 0 - 99 mg/dL Final   LDL Cholesterol  Date Value Ref Range Status  08/31/2020 107 (H) 0 - 99 mg/dL Final    Comment:           Total Cholesterol/HDL:CHD Risk Coronary Heart Disease Risk Table                     Men   Women  1/2 Average Risk   3.4   3.3  Average Risk       5.0   4.4  2 X Average Risk   9.6   7.1  3 X Average Risk  23.4   11.0        Use the calculated Patient  Ratio above and the CHD Risk Table to determine the patient's CHD Risk.        ATP III CLASSIFICATION (LDL):  <100     mg/dL   Optimal  100-129  mg/dL   Near or Above                    Optimal  130-159  mg/dL   Borderline  160-189  mg/dL   High  >190     mg/dL   Very High Performed at [facility] [address], [place], [place] [address]      +-------+-----------+-----------+------------+------------+  ABI/TBIToday's ABIToday's TBIPrevious ABIPrevious TBI  +-------+-----------+-----------+------------+------------+  Right  [place]         0.51                                 +-------+-----------+-----------+------------+------------+  Left   0.51       absent                               +-------+-----------+-----------+------------+------------+   Yevonne Aline. Stanford Breed, MD [facility] Phone Number: [phone] 03/01/2021 6:58 PM  Total time spent on preparing this encounter including chart review, data review,  collecting history, examining the patient, coordinating care for this established patient, 40 minutes.  Portions of this report may have been transcribed using voice recognition software.  Every effort has been made to ensure accuracy; however, inadvertent computerized transcription errors may still be present.

## 2021-02-28 NOTE — H&P (View-Only) (Signed)
[facility] [place]  ASSESSMENT / PLAN: Molly [name] is a 70 y.o. female with atherosclerosis of native arteries of left lower extremity causing ulceration.  Patient counseled patients with chronic limb threatening ischemia have an annual risk of cardiovascular mortality of 25% and a high risk of amputation.   Recommend the following which can slow the progression of atherosclerosis and reduce the risk of major adverse cardiac / limb events:  Complete cessation from all tobacco products. Blood glucose control with goal A1c < 7%. Blood pressure control with goal blood pressure < 140/90 mmHg. Lipid reduction therapy with goal LDL-C <100 mg/dL (<70 if symptomatic from PAD).  Aspirin 81mg  PO QD.  Atorvastatin 40-80mg  PO QD (or other "high intensity" statin therapy).  Plan left lower extremity angiogram with possible intervention via right common femoral artery approach in cath lab Friday 03/04/21.  CHIEF COMPLAINT: left foot / heel ulceration  HISTORY OF PRESENT ILLNESS: Molly [name] is a 70 y.o. female well known to me for hemodialysis access surgery. The patient is referred to clinic by Dr. Lucia Gaskins for evaluation of left foot ischemic ulceration.  The patient had a prolonged hospitalization 12/27/2020 to 01/17/2021.  She suffered a fall in hospital which required operative fixation of a broken hip.  She developed sacral and heel decubitus ulceration.  She is also developed left great toe ulceration.  Prior to her hospitalization, the patient was active and ambulatory.  She denies any history of claudication or ischemic rest pain prior to hospitalization.  Past Medical History:  Diagnosis Date   Abdominal aortic atherosclerosis with stenosis 06/29/2015   AKI (acute kidney injury) ([place]) 06/29/2015   Atherosclerosis of coronary artery without angina pectoris 04/21/2020   Atopic dermatitis 04/21/2020   Benign hypertension with CKD (chronic kidney disease) stage III ([place]) 04/21/2020    Bilateral impacted cerumen 08/21/2019   Chest pain 08/31/2020   Cholecystitis 06/29/2015   Cholelithiasis 06/29/2015   Claudication in peripheral vascular disease ([place]) 08/28/2019   Peripheral arterial disease   Colon cancer screening 04/21/2020   Dehydration with hyponatremia 06/29/2015   Diabetes mellitus ([place]) 07/15/2018   Diarrhea 04/21/2020   DKA (diabetic ketoacidoses) 06/29/2015   Dyslipidemia 08/30/2018   Elevated troponin    Epigastric pain 04/21/2020   Gastroesophageal reflux disease 04/21/2020   HTN (hypertension) 06/29/2015   Hyperglycemia due to type 2 diabetes mellitus ([place]) 04/21/2020   Hypertension    Hypertensive emergency 07/12/2020   Hypertensive heart disease without congestive heart failure 04/21/2020   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   Proptosis 04/21/2020   Proximal leg weakness 09/10/2018   Ptosis of left eyelid 09/10/2018   Renal artery stenosis ([place]) 12/17/2019   Renal artery stenosis   Standard chest x-ray abnormal 04/21/2020   Vitamin D deficiency 04/21/2020   Weight loss 04/21/2020    Past Surgical History:  Procedure Laterality Date   ABDOMINAL AORTOGRAM W/LOWER EXTREMITY N/A 08/28/2019   Procedure: ABDOMINAL AORTOGRAM W/LOWER EXTREMITY;  Surgeon: Lorretta Harp, MD;  Location: [facility];  Service: Cardiovascular;  Laterality: N/A;   AV FISTULA PLACEMENT Left 01/10/2021   Procedure: LEFT ARM BRACHIOCEPHALIC  ARTERIOVENOUS  (AV) FISTULA CREATION;  Surgeon: Cherre Robins, MD;  Location: [facility];  Service: Vascular;  Laterality: Left;   BIOPSY  01/05/2021   Procedure: BIOPSY;  Surgeon: Gatha Mayer, MD;  Location: [facility];  Service: Endoscopy;;   CATARACT EXTRACTION, BILATERAL     CHOLECYSTECTOMY N/A 06/30/2015   Procedure: LAPAROSCOPIC CHOLECYSTECTOMY WITH INTRAOPERATIVE CHOLANGIOGRAM;  Surgeon: Donnie Mesa, MD;  Location: [facility];  Service: General;  Laterality: N/A;   CYSTOSCOPY W/ URETERAL STENT PLACEMENT Left 12/30/2020   Procedure: CYSTOSCOPY WITH RETROGRADE PYELOGRAM/URETERAL STENT PLACEMENT;  Surgeon: Janith Lima, MD;  Location: [facility];  Service: Urology;  Laterality: Left;   ESOPHAGOGASTRODUODENOSCOPY (EGD) WITH PROPOFOL N/A 01/05/2021   Procedure: ESOPHAGOGASTRODUODENOSCOPY (EGD) WITH PROPOFOL;  Surgeon: Gatha Mayer, MD;  Location: [facility];  Service: Endoscopy;  Laterality: N/A;   INSERTION OF DIALYSIS CATHETER N/A 01/10/2021   Procedure: INSERTION OF TUNNELED DIALYSIS CATHETER RIGHT INTERNAL JUGULAR;  Surgeon: Cherre Robins, MD;  Location: [facility];  Service: Vascular;  Laterality: N/A;   INTRAMEDULLARY (IM) NAIL INTERTROCHANTERIC Left 01/06/2021   Procedure: INTRAMEDULLARY (IM) NAIL INTERTROCHANTRIC;  Surgeon: Erle Crocker, MD;  Location: [facility];  Service: Orthopedics;  Laterality: Left;   IR FLUORO GUIDE CV LINE RIGHT  01/01/2021   IR US GUIDE VASC ACCESS RIGHT  01/01/2021   PERIPHERAL VASCULAR BALLOON ANGIOPLASTY Right 08/28/2019   Procedure: PERIPHERAL VASCULAR BALLOON ANGIOPLASTY;  Surgeon: Lorretta Harp, MD;  Location: [facility];  Service: Cardiovascular;  Laterality: Right;  attempted SFA   REMOVAL OF A DIALYSIS CATHETER Right 01/10/2021   Procedure: REMOVAL OF TEMPORARY DIALYSIS CATHETER RIGHT GROIN;  Surgeon: Cherre Robins, MD;  Location: [facility] OR;  Service: Vascular;  Laterality: Right;   THROMBECTOMY BRACHIAL ARTERY  01/10/2021   Procedure: THROMBECTOMY LEFT CEPHALIC  VEIN;  Surgeon: Cherre Robins, MD;  Location: [facility];  Service: Vascular;;    Family History  Problem Relation Age of Onset   Breast cancer Mother    Hypertension Mother    Hypertension Father    Pancreatic cancer Other        uncle    Social History   Socioeconomic History   Marital status: Married    Spouse name: Beyounce Dickens   Number of children: 2   Years of education: Not on file   Highest education level: Bachelor's degree (e.g., BA, AB, BS)  Occupational History   Occupation: retired    Comment: Med Tech  Tobacco Use   Smoking status: Never   Smokeless tobacco: Never  Vaping Use   Vaping Use: Never used  Substance and Sexual Activity   Alcohol use: No   Drug use: Never   Sexual activity: Not on file  Other Topics Concern   Not on file  Social History Narrative   Not on file   Social Determinants of Health   Financial Resource Strain: Low Risk    Difficulty of Paying Living Expenses: Not very hard  Food Insecurity: No Food Insecurity   Worried About Charity fundraiser in the Last Year: Never true   Ran Out of Food in the Last Year: Never true  Transportation Needs: No Transportation Needs   Lack of Transportation (Medical): No   Lack of Transportation (Non-Medical): No  Physical Activity: Not  on file  Stress: Not on file  Social Connections: Not on file  Intimate Partner Violence: Not on file    Allergies  Allergen Reactions   Lisinopril Other (See Comments)   Mercury Nausea And Vomiting    Current Outpatient Medications  Medication Sig Dispense Refill   acetaminophen (TYLENOL) 325 MG tablet Take 2 tablets (650 mg total) by mouth every 4 (four) hours as needed for headache or mild pain.     apixaban (ELIQUIS) 5 MG TABS tablet Take 1 tablet (5 mg total) by mouth 2 (two) times daily. 60 tablet 0   Cholecalciferol 25 MCG (1000 UT) tablet Take 2,000 Units by mouth daily.     cilostazol (PLETAL) 50 MG tablet Take 1 tablet (50 mg total) by mouth 2 (two)  times daily. 180 tablet 3   dronabinol (MARINOL) 2.5 MG capsule Take 1 capsule (2.5 mg total) by mouth daily before lunch. 30 capsule 0   ezetimibe (ZETIA) 10 MG tablet Take 1 tablet (10 mg total) by mouth daily. 90 tablet 2   famotidine (PEPCID) 20 MG tablet Take 20 mg by mouth in the morning and at bedtime.     FLUoxetine (PROZAC) 10 MG capsule Take 1 capsule (10 mg total) by mouth daily. 30 capsule 0   hydrocortisone cream 1 % Apply 1 application topically 4 (four) times daily as needed for itching.     isosorbide mononitrate (IMDUR) 30 MG 24 hr tablet Take 1 tablet (30 mg total) by mouth daily. 30 tablet 0   ketoconazole (NIZORAL) 2 % cream Apply 1 application topically daily as needed for irritation.      levocetirizine (XYZAL) 5 MG tablet Take 5 mg by mouth daily as needed for allergies.     loperamide (IMODIUM A-D) 2 MG tablet Take 2 mg by mouth 4 (four) times daily as needed for diarrhea or loose stools.     metoprolol succinate (TOPROL-XL) 50 MG 24 hr tablet Take 1 tablet (50 mg total) by mouth daily. Take with or immediately following a meal. 30 tablet 0   rosuvastatin (CRESTOR) 10 MG tablet Take 1 tablet (10 mg total) by mouth daily. 30 tablet 0   amiodarone (PACERONE) 200 MG tablet Take 1 tablet (200 mg total) by mouth daily for 7 days. 7 tablet 0   No current facility-administered medications for this visit.    REVIEW OF SYSTEMS:  [X]  denotes positive finding, [ ]  denotes negative finding Cardiac  Comments:  Chest pain or chest pressure:    Shortness of breath upon exertion:    Short of breath when lying flat:    Irregular heart rhythm:        Vascular    Pain in calf, thigh, or hip brought on by ambulation:    Pain in feet at night that wakes you up from your sleep:     Blood clot in your veins:    Leg swelling:         Pulmonary    Oxygen at home:    Productive cough:     Wheezing:         Neurologic    Sudden weakness in arms or legs:     Sudden numbness in arms  or legs:     Sudden onset of difficulty speaking or slurred speech:    Temporary loss of vision in one eye:     Problems with dizziness:         Gastrointestinal    Blood in stool:  Vomited blood:         Genitourinary    Burning when urinating:     Blood in urine:        Psychiatric    Major depression:         Hematologic    Bleeding problems:    Problems with blood clotting too easily:        Skin    Rashes or ulcers:        Constitutional    Fever or chills:      PHYSICAL EXAM Vitals:   03/01/21 1600  BP: (!) 199/82  Pulse: 75  Resp: 20  Temp: (!) 97.5 F (36.4 C)  SpO2: 93%  Weight: 138 lb (62.6 kg)  Height: 5\' 2"  (1.575 m)    Constitutional: chronically ill appearing. no distress. Appears well nourished.  Neurologic: CN intact. no focal findings. no sensory loss. Psychiatric:  Mood and affect symmetric and appropriate. Eyes:  No icterus. No conjunctival pallor. Ears, nose, throat:  mucous membranes moist. Midline trachea.  Cardiac: regular rate and rhythm.  Respiratory:  unlabored. Abdominal:  soft, non-tender, non-distended.  Peripheral vascular: no palpable pedal pulses. No palpable popliteal pulses Extremity: no edema. no cyanosis. no pallor.  Skin: no gangrene. + ulceration about the left heel and great toe.  Lymphatic: no Stemmer's sign. no palpable lymphadenopathy.  PERTINENT LABORATORY AND RADIOLOGIC DATA  Most recent CBC CBC Latest Ref Rng & Units 01/17/2021 01/16/2021 01/15/2021  WBC 4.0 - 10.5 K/uL 9.8 10.5 9.5  Hemoglobin 12.0 - 15.0 g/dL 8.2(L) 8.0(L) 8.4(L)  Hematocrit 36.0 - 46.0 % 25.9(L) 25.0(L) 26.7(L)  Platelets 150 - 400 K/uL 308 273 313     Most recent CMP CMP Latest Ref Rng & Units 01/17/2021 01/16/2021 01/15/2021  Glucose 70 - 99 mg/dL 122(H) 131(H) 113(H)  BUN 8 - 23 mg/dL 22 9 23   Creatinine 0.44 - 1.00 mg/dL 2.78(H) 1.87(H) 2.90(H)  Sodium 135 - 145 mmol/L 132(L) 134(L) 133(L)  Potassium 3.5 - 5.1 mmol/L 3.7 3.2(L) 3.7   Chloride 98 - 111 mmol/L 99 99 99  CO2 22 - 32 mmol/L 25 26 24   Calcium 8.9 - 10.3 mg/dL 7.7(L) 7.7(L) 7.9(L)  Total Protein 6.5 - 8.1 g/dL - - -  Total Bilirubin 0.3 - 1.2 mg/dL - - -  Alkaline Phos 38 - 126 U/L - - -  AST 15 - 41 U/L - - -  ALT 0 - 44 U/L - - -    Renal function CrCl cannot be calculated (Patient's most recent lab result is older than the maximum 21 days allowed.).  Hgb A1c MFr Bld (%)  Date Value  11/27/2020 8.2 (H)    LDL Chol Calc (NIH)  Date Value Ref Range Status  12/10/2019 46 0 - 99 mg/dL Final   LDL Cholesterol  Date Value Ref Range Status  08/31/2020 107 (H) 0 - 99 mg/dL Final    Comment:           Total Cholesterol/HDL:CHD Risk Coronary Heart Disease Risk Table                     Men   Women  1/2 Average Risk   3.4   3.3  Average Risk       5.0   4.4  2 X Average Risk   9.6   7.1  3 X Average Risk  23.4   11.0        Use the calculated Patient  Ratio above and the CHD Risk Table to determine the patient's CHD Risk.        ATP III CLASSIFICATION (LDL):  <100     mg/dL   Optimal  100-129  mg/dL   Near or Above                    Optimal  130-159  mg/dL   Borderline  160-189  mg/dL   High  >190     mg/dL   Very High Performed at [facility] [address], [place], [place] [address]      +-------+-----------+-----------+------------+------------+  ABI/TBIToday's ABIToday's TBIPrevious ABIPrevious TBI  +-------+-----------+-----------+------------+------------+  Right  [place]         0.51                                 +-------+-----------+-----------+------------+------------+  Left   0.51       absent                               +-------+-----------+-----------+------------+------------+   Yevonne Aline. Stanford Breed, MD [facility] Phone Number: [phone] 03/01/2021 6:58 PM  Total time spent on preparing this encounter including chart review, data review,  collecting history, examining the patient, coordinating care for this established patient, 40 minutes.  Portions of this report may have been transcribed using voice recognition software.  Every effort has been made to ensure accuracy; however, inadvertent computerized transcription errors may still be present.

## 2021-03-01 ENCOUNTER — Ambulatory Visit (INDEPENDENT_AMBULATORY_CARE_PROVIDER_SITE_OTHER): Payer: Medicare Other | Admitting: Vascular Surgery

## 2021-03-01 ENCOUNTER — Encounter: Payer: Self-pay | Admitting: Vascular Surgery

## 2021-03-01 ENCOUNTER — Other Ambulatory Visit: Payer: Self-pay

## 2021-03-01 ENCOUNTER — Ambulatory Visit (HOSPITAL_COMMUNITY)
Admission: RE | Admit: 2021-03-01 | Discharge: 2021-03-01 | Disposition: A | Discharge status: Home / Self Care | Payer: Medicare Other | Source: Ambulatory Visit | Attending: Vascular Surgery | Admitting: Vascular Surgery

## 2021-03-01 VITALS — BP 199/82 | HR 75 | Temp 97.5°F | Resp 20 | Ht 62.0 in | Wt 138.0 lb

## 2021-03-01 DIAGNOSIS — E876 Hypokalemia: Secondary | ICD-10-CM | POA: Diagnosis not present

## 2021-03-01 DIAGNOSIS — I739 Peripheral vascular disease, unspecified: Secondary | ICD-10-CM | POA: Insufficient documentation

## 2021-03-01 DIAGNOSIS — N186 End stage renal disease: Secondary | ICD-10-CM | POA: Diagnosis not present

## 2021-03-01 DIAGNOSIS — N2581 Secondary hyperparathyroidism of renal origin: Secondary | ICD-10-CM | POA: Diagnosis not present

## 2021-03-01 DIAGNOSIS — D631 Anemia in chronic kidney disease: Secondary | ICD-10-CM | POA: Diagnosis not present

## 2021-03-01 DIAGNOSIS — D509 Iron deficiency anemia, unspecified: Secondary | ICD-10-CM | POA: Diagnosis not present

## 2021-03-01 DIAGNOSIS — I120 Hypertensive chronic kidney disease with stage 5 chronic kidney disease or end stage renal disease: Secondary | ICD-10-CM | POA: Diagnosis not present

## 2021-03-01 DIAGNOSIS — T8249XD Other complication of vascular dialysis catheter, subsequent encounter: Secondary | ICD-10-CM | POA: Diagnosis not present

## 2021-03-01 DIAGNOSIS — Z992 Dependence on renal dialysis: Secondary | ICD-10-CM | POA: Diagnosis not present

## 2021-03-02 DIAGNOSIS — L89152 Pressure ulcer of sacral region, stage 2: Secondary | ICD-10-CM | POA: Diagnosis not present

## 2021-03-02 DIAGNOSIS — I132 Hypertensive heart and chronic kidney disease with heart failure and with stage 5 chronic kidney disease, or end stage renal disease: Secondary | ICD-10-CM | POA: Diagnosis not present

## 2021-03-02 DIAGNOSIS — J9621 Acute and chronic respiratory failure with hypoxia: Secondary | ICD-10-CM | POA: Diagnosis not present

## 2021-03-02 DIAGNOSIS — J9622 Acute and chronic respiratory failure with hypercapnia: Secondary | ICD-10-CM | POA: Diagnosis not present

## 2021-03-02 DIAGNOSIS — R1319 Other dysphagia: Secondary | ICD-10-CM | POA: Diagnosis not present

## 2021-03-02 DIAGNOSIS — K219 Gastro-esophageal reflux disease without esophagitis: Secondary | ICD-10-CM | POA: Diagnosis not present

## 2021-03-02 DIAGNOSIS — E1122 Type 2 diabetes mellitus with diabetic chronic kidney disease: Secondary | ICD-10-CM | POA: Diagnosis not present

## 2021-03-02 DIAGNOSIS — K589 Irritable bowel syndrome without diarrhea: Secondary | ICD-10-CM | POA: Diagnosis not present

## 2021-03-02 DIAGNOSIS — Z993 Dependence on wheelchair: Secondary | ICD-10-CM | POA: Diagnosis not present

## 2021-03-02 DIAGNOSIS — I5033 Acute on chronic diastolic (congestive) heart failure: Secondary | ICD-10-CM | POA: Diagnosis not present

## 2021-03-02 DIAGNOSIS — E1151 Type 2 diabetes mellitus with diabetic peripheral angiopathy without gangrene: Secondary | ICD-10-CM | POA: Diagnosis not present

## 2021-03-02 DIAGNOSIS — Z794 Long term (current) use of insulin: Secondary | ICD-10-CM | POA: Diagnosis not present

## 2021-03-02 DIAGNOSIS — S72002D Fracture of unspecified part of neck of left femur, subsequent encounter for closed fracture with routine healing: Secondary | ICD-10-CM | POA: Diagnosis not present

## 2021-03-02 DIAGNOSIS — E559 Vitamin D deficiency, unspecified: Secondary | ICD-10-CM | POA: Diagnosis not present

## 2021-03-02 DIAGNOSIS — I051 Rheumatic mitral insufficiency: Secondary | ICD-10-CM | POA: Diagnosis not present

## 2021-03-02 DIAGNOSIS — D631 Anemia in chronic kidney disease: Secondary | ICD-10-CM | POA: Diagnosis not present

## 2021-03-02 DIAGNOSIS — M8580 Other specified disorders of bone density and structure, unspecified site: Secondary | ICD-10-CM | POA: Diagnosis not present

## 2021-03-02 DIAGNOSIS — I701 Atherosclerosis of renal artery: Secondary | ICD-10-CM | POA: Diagnosis not present

## 2021-03-02 DIAGNOSIS — E785 Hyperlipidemia, unspecified: Secondary | ICD-10-CM | POA: Diagnosis not present

## 2021-03-02 DIAGNOSIS — I2510 Atherosclerotic heart disease of native coronary artery without angina pectoris: Secondary | ICD-10-CM | POA: Diagnosis not present

## 2021-03-02 DIAGNOSIS — Z992 Dependence on renal dialysis: Secondary | ICD-10-CM | POA: Diagnosis not present

## 2021-03-02 DIAGNOSIS — L89620 Pressure ulcer of left heel, unstageable: Secondary | ICD-10-CM | POA: Diagnosis not present

## 2021-03-02 DIAGNOSIS — I6523 Occlusion and stenosis of bilateral carotid arteries: Secondary | ICD-10-CM | POA: Diagnosis not present

## 2021-03-02 DIAGNOSIS — M1990 Unspecified osteoarthritis, unspecified site: Secondary | ICD-10-CM | POA: Diagnosis not present

## 2021-03-02 DIAGNOSIS — N186 End stage renal disease: Secondary | ICD-10-CM | POA: Diagnosis not present

## 2021-03-03 DIAGNOSIS — T8249XD Other complication of vascular dialysis catheter, subsequent encounter: Secondary | ICD-10-CM | POA: Diagnosis not present

## 2021-03-03 DIAGNOSIS — Z992 Dependence on renal dialysis: Secondary | ICD-10-CM | POA: Diagnosis not present

## 2021-03-03 DIAGNOSIS — N186 End stage renal disease: Secondary | ICD-10-CM | POA: Diagnosis not present

## 2021-03-03 DIAGNOSIS — D631 Anemia in chronic kidney disease: Secondary | ICD-10-CM | POA: Diagnosis not present

## 2021-03-03 DIAGNOSIS — N2581 Secondary hyperparathyroidism of renal origin: Secondary | ICD-10-CM | POA: Diagnosis not present

## 2021-03-03 DIAGNOSIS — E876 Hypokalemia: Secondary | ICD-10-CM | POA: Diagnosis not present

## 2021-03-03 DIAGNOSIS — I120 Hypertensive chronic kidney disease with stage 5 chronic kidney disease or end stage renal disease: Secondary | ICD-10-CM | POA: Diagnosis not present

## 2021-03-03 DIAGNOSIS — G4733 Obstructive sleep apnea (adult) (pediatric): Secondary | ICD-10-CM | POA: Diagnosis not present

## 2021-03-03 DIAGNOSIS — D509 Iron deficiency anemia, unspecified: Secondary | ICD-10-CM | POA: Diagnosis not present

## 2021-03-04 ENCOUNTER — Other Ambulatory Visit: Payer: Self-pay

## 2021-03-04 ENCOUNTER — Encounter (HOSPITAL_COMMUNITY)
Admission: RE | Disposition: A | Discharge status: Home / Self Care | Payer: Self-pay | Source: Home / Self Care | Attending: Vascular Surgery

## 2021-03-04 ENCOUNTER — Ambulatory Visit (HOSPITAL_COMMUNITY)
Admission: RE | Admit: 2021-03-04 | Discharge: 2021-03-04 | Disposition: A | Discharge status: Home / Self Care | Payer: Medicare Other | Attending: Vascular Surgery | Admitting: Vascular Surgery

## 2021-03-04 DIAGNOSIS — Z7901 Long term (current) use of anticoagulants: Secondary | ICD-10-CM | POA: Insufficient documentation

## 2021-03-04 DIAGNOSIS — Z539 Procedure and treatment not carried out, unspecified reason: Secondary | ICD-10-CM | POA: Diagnosis not present

## 2021-03-04 LAB — POCT I-STAT, CHEM 8
BUN: 17 mg/dL (ref 8–23)
Calcium, Ion: 1.15 mmol/L (ref 1.15–1.40)
Chloride: 96 mmol/L — ABNORMAL LOW (ref 98–111)
Creatinine, Ser: 1.7 mg/dL — ABNORMAL HIGH (ref 0.44–1.00)
Glucose, Bld: 135 mg/dL — ABNORMAL HIGH (ref 70–99)
HCT: 44 % (ref 36.0–46.0)
Hemoglobin: 15 g/dL (ref 12.0–15.0)
Potassium: 4.2 mmol/L (ref 3.5–5.1)
Sodium: 140 mmol/L (ref 135–145)
TCO2: 34 mmol/L — ABNORMAL HIGH (ref 22–32)

## 2021-03-04 SURGERY — ABDOMINAL AORTOGRAM W/LOWER EXTREMITY
Anesthesia: LOCAL

## 2021-03-04 MED ORDER — SODIUM CHLORIDE 0.9 % IV SOLN: 250.0000 mL | INTRAVENOUS | Status: DC | PRN

## 2021-03-04 MED ORDER — SODIUM CHLORIDE 0.9% FLUSH: 3.0000 mL | INTRAVENOUS | Status: DC | PRN

## 2021-03-04 MED ORDER — SODIUM CHLORIDE 0.9% FLUSH: 3.0000 mL | Freq: Two times a day (BID) | INTRAVENOUS | Status: DC

## 2021-03-04 NOTE — Progress Notes (Signed)
Patient arrived for procedure today.  Review of home medication, patient took Eliquis this am.  Notified Dr Stanford Breed.  Unfortunately we will not be able to do procedure today per Dr Stanford Breed.  Explained to patient, will let office know to reschedule.

## 2021-03-05 DIAGNOSIS — N2581 Secondary hyperparathyroidism of renal origin: Secondary | ICD-10-CM | POA: Diagnosis not present

## 2021-03-05 DIAGNOSIS — E876 Hypokalemia: Secondary | ICD-10-CM | POA: Diagnosis not present

## 2021-03-05 DIAGNOSIS — I120 Hypertensive chronic kidney disease with stage 5 chronic kidney disease or end stage renal disease: Secondary | ICD-10-CM | POA: Diagnosis not present

## 2021-03-05 DIAGNOSIS — D509 Iron deficiency anemia, unspecified: Secondary | ICD-10-CM | POA: Diagnosis not present

## 2021-03-05 DIAGNOSIS — T8249XD Other complication of vascular dialysis catheter, subsequent encounter: Secondary | ICD-10-CM | POA: Diagnosis not present

## 2021-03-05 DIAGNOSIS — D631 Anemia in chronic kidney disease: Secondary | ICD-10-CM | POA: Diagnosis not present

## 2021-03-05 DIAGNOSIS — Z992 Dependence on renal dialysis: Secondary | ICD-10-CM | POA: Diagnosis not present

## 2021-03-05 DIAGNOSIS — N186 End stage renal disease: Secondary | ICD-10-CM | POA: Diagnosis not present

## 2021-03-07 ENCOUNTER — Other Ambulatory Visit: Payer: Self-pay

## 2021-03-07 ENCOUNTER — Encounter (HOSPITAL_COMMUNITY)
Admission: RE | Disposition: A | Discharge status: Home / Self Care | Payer: Self-pay | Source: Home / Self Care | Attending: Vascular Surgery

## 2021-03-07 ENCOUNTER — Ambulatory Visit (HOSPITAL_COMMUNITY)
Admission: RE | Admit: 2021-03-07 | Discharge: 2021-03-07 | Disposition: A | Discharge status: Home / Self Care | Payer: Medicare Other | Attending: Vascular Surgery | Admitting: Vascular Surgery

## 2021-03-07 DIAGNOSIS — I70244 Atherosclerosis of native arteries of left leg with ulceration of heel and midfoot: Secondary | ICD-10-CM | POA: Diagnosis not present

## 2021-03-07 DIAGNOSIS — I120 Hypertensive chronic kidney disease with stage 5 chronic kidney disease or end stage renal disease: Secondary | ICD-10-CM | POA: Insufficient documentation

## 2021-03-07 DIAGNOSIS — L97529 Non-pressure chronic ulcer of other part of left foot with unspecified severity: Secondary | ICD-10-CM | POA: Insufficient documentation

## 2021-03-07 DIAGNOSIS — L97429 Non-pressure chronic ulcer of left heel and midfoot with unspecified severity: Secondary | ICD-10-CM | POA: Insufficient documentation

## 2021-03-07 DIAGNOSIS — N186 End stage renal disease: Secondary | ICD-10-CM | POA: Insufficient documentation

## 2021-03-07 DIAGNOSIS — E11621 Type 2 diabetes mellitus with foot ulcer: Secondary | ICD-10-CM | POA: Insufficient documentation

## 2021-03-07 DIAGNOSIS — E1122 Type 2 diabetes mellitus with diabetic chronic kidney disease: Secondary | ICD-10-CM | POA: Insufficient documentation

## 2021-03-07 DIAGNOSIS — E1151 Type 2 diabetes mellitus with diabetic peripheral angiopathy without gangrene: Secondary | ICD-10-CM | POA: Insufficient documentation

## 2021-03-07 DIAGNOSIS — I70245 Atherosclerosis of native arteries of left leg with ulceration of other part of foot: Secondary | ICD-10-CM | POA: Diagnosis not present

## 2021-03-07 HISTORY — PX: ABDOMINAL AORTOGRAM W/LOWER EXTREMITY: CATH118223

## 2021-03-07 LAB — POCT I-STAT, CHEM 8
BUN: 29 mg/dL — ABNORMAL HIGH (ref 8–23)
Calcium, Ion: 0.93 mmol/L — ABNORMAL LOW (ref 1.15–1.40)
Chloride: 97 mmol/L — ABNORMAL LOW (ref 98–111)
Creatinine, Ser: 2.7 mg/dL — ABNORMAL HIGH (ref 0.44–1.00)
Glucose, Bld: 97 mg/dL (ref 70–99)
HCT: 40 % (ref 36.0–46.0)
Hemoglobin: 13.6 g/dL (ref 12.0–15.0)
Potassium: 5 mmol/L (ref 3.5–5.1)
Sodium: 137 mmol/L (ref 135–145)
TCO2: 33 mmol/L — ABNORMAL HIGH (ref 22–32)

## 2021-03-07 SURGERY — ABDOMINAL AORTOGRAM W/LOWER EXTREMITY
Anesthesia: LOCAL | Laterality: Bilateral

## 2021-03-07 MED ORDER — MIDAZOLAM HCL 2 MG/2ML IJ SOLN
INTRAMUSCULAR | Status: AC
  Filled 2021-03-07: qty 2

## 2021-03-07 MED ORDER — HEPARIN (PORCINE) IN NACL 1000-0.9 UT/500ML-% IV SOLN
INTRAVENOUS | Status: DC | PRN
  Administered 2021-03-07 (×2): 500 mL

## 2021-03-07 MED ORDER — ACETAMINOPHEN 325 MG PO TABS: 650.0000 mg | ORAL_TABLET | ORAL | Status: DC | PRN

## 2021-03-07 MED ORDER — LABETALOL HCL 5 MG/ML IV SOLN: 10.0000 mg | INTRAVENOUS | Status: DC | PRN

## 2021-03-07 MED ORDER — SODIUM CHLORIDE 0.9 % IV SOLN: 250.0000 mL | INTRAVENOUS | Status: DC | PRN

## 2021-03-07 MED ORDER — HEPARIN (PORCINE) IN NACL 1000-0.9 UT/500ML-% IV SOLN
INTRAVENOUS | Status: AC
  Filled 2021-03-07: qty 1000

## 2021-03-07 MED ORDER — LIDOCAINE HCL (PF) 1 % IJ SOLN
INTRAMUSCULAR | Status: DC | PRN
  Administered 2021-03-07: 12 mL

## 2021-03-07 MED ORDER — SODIUM CHLORIDE 0.9 % IV SOLN: INTRAVENOUS | Status: DC

## 2021-03-07 MED ORDER — LIDOCAINE HCL (PF) 1 % IJ SOLN
INTRAMUSCULAR | Status: AC
  Filled 2021-03-07: qty 30

## 2021-03-07 MED ORDER — IODIXANOL 320 MG/ML IV SOLN
INTRAVENOUS | Status: DC | PRN
  Administered 2021-03-07: 150 mL

## 2021-03-07 MED ORDER — FENTANYL CITRATE (PF) 100 MCG/2ML IJ SOLN
INTRAMUSCULAR | Status: DC | PRN
  Administered 2021-03-07: 12.5 ug via INTRAVENOUS

## 2021-03-07 MED ORDER — MIDAZOLAM HCL 2 MG/2ML IJ SOLN
INTRAMUSCULAR | Status: DC | PRN
  Administered 2021-03-07: .5 mg via INTRAVENOUS

## 2021-03-07 MED ORDER — FENTANYL CITRATE (PF) 100 MCG/2ML IJ SOLN
INTRAMUSCULAR | Status: AC
  Filled 2021-03-07: qty 2

## 2021-03-07 MED ORDER — HYDRALAZINE HCL 20 MG/ML IJ SOLN: 5.0000 mg | INTRAMUSCULAR | Status: DC | PRN

## 2021-03-07 MED ORDER — HYDRALAZINE HCL 20 MG/ML IJ SOLN
INTRAMUSCULAR | Status: DC | PRN
  Administered 2021-03-07: 10 mg via INTRAVENOUS

## 2021-03-07 MED ORDER — HYDRALAZINE HCL 20 MG/ML IJ SOLN
INTRAMUSCULAR | Status: AC
  Filled 2021-03-07: qty 1

## 2021-03-07 MED ORDER — SODIUM CHLORIDE 0.9% FLUSH: 3.0000 mL | INTRAVENOUS | Status: DC | PRN

## 2021-03-07 MED ORDER — SODIUM CHLORIDE 0.9% FLUSH: 3.0000 mL | Freq: Two times a day (BID) | INTRAVENOUS | Status: DC

## 2021-03-07 MED ORDER — OXYCODONE HCL 5 MG PO TABS: 5.0000 mg | ORAL_TABLET | ORAL | Status: DC | PRN

## 2021-03-07 SURGICAL SUPPLY — 10 items
CATH OMNI FLUSH 5F 65CM (CATHETERS) ×2 IMPLANT
CATH STRAIGHT 5FR 65CM (CATHETERS) ×2 IMPLANT
CLOSURE MYNX CONTROL 5F (Vascular Products) ×2 IMPLANT
KIT MICROPUNCTURE NIT STIFF (SHEATH) ×2 IMPLANT
KIT PV (KITS) ×2 IMPLANT
SHEATH PINNACLE 5F 10CM (SHEATH) ×2 IMPLANT
SYR MEDRAD MARK V 150ML (SYRINGE) ×2 IMPLANT
TRANSDUCER W/STOPCOCK (MISCELLANEOUS) ×2 IMPLANT
TRAY PV CATH (CUSTOM PROCEDURE TRAY) ×2 IMPLANT
WIRE STARTER BENTSON 035X150 (WIRE) ×2 IMPLANT

## 2021-03-07 NOTE — Op Note (Signed)
    Patient name: NAYLENE FOELL MRN: [id number] DOB: Dec 04, 1950 Sex: female  03/07/2021 Pre-operative Diagnosis: End-stage renal disease, chronic limb threatening ischemia left lower extremity with wounds Post-operative diagnosis:  Same Surgeon:  Eda Paschal. Donzetta Matters, MD Procedure Performed: 1.  Ultrasound-guided cannulation right common femoral artery 2.  Aortogram with bilateral lower extremity runoff 3.  Selection of left common femoral artery for spot views left foot 4.  Moderate sedation with fentanyl and Versed for 36 minutes 5.  Mynx device closure right common femoral artery   Indications: 70 year old female now with a pressure ulcer on her left heel and toe ulceration on the left great toe.  She has depressed ABIs with end-stage renal disease.  She is indicated for angiography from right common femoral approach and possible intervention.  Findings: The aorta and iliac segments are calcified however there is no flow-limiting stenosis.  Renal arteries are diminutive did not appear to fill her endorgan's consistent with dialysis.  Hypogastrics are both patent common femoral arteries are calcified but patent.  Right side the SFA is heavily calcified and diseased and frankly occludes for a long segment reconstitutes above the knee popliteal and then there appears to be very heavily calcified diseased tibials that do not fill past the mid calf on the right.  On the left side the SFA is diminutive after the takeoff it then frankly occludes for the long segment reconstitutes above the knee is a popliteal island.  This then occludes below the knee the anterior tibial reconstitutes after about 5 cm this then goes down to the level of the ankle and gives rise to the peroneal and posterior tibial artery but given the difficulty and delayed filling of contrast difficult to see the contrast filling the foot itself.  Plan will be for left femoral to anterior tibial artery bypass.   Procedure:  The patient  was identified in the holding area and taken to room 8.  The patient was then placed supine on the table and prepped and draped in the usual sterile fashion.  A time out was called.  Ultrasound was used to evaluate the right common femoral artery.  This was calcified somewhat diseased although patent for sheath placement.  There is anesthetized 1% lidocaine.  Moderate sedation was administered with fentanyl and Versed her vital signs were monitored throughout the case.  We cannulated the common femoral artery with micropuncture needle followed by wire and sheath with direct ultrasound visualization and images saved the permanent record.  We placed a Bentson wire followed by 5 [place] sheath.  Omni catheter was placed to level of L1 aortogram was performed followed by bilateral extremity runoff.  I then crossed the bifurcation this required Omni catheter and Bentson wire followed by straight catheter to the level to common femoral artery and perform spot views of the left lower extremity.  With above findings I remove the catheter.  I performed retrograde angiography from the right sheath and then placed a minx device for closure.  She tolerated procedure without any complication.   Contrast: 150 cc  Manfred Laspina C. Donzetta Matters, MD [facility] [place] Office: [phone] Pager: [phone]

## 2021-03-07 NOTE — Interval H&P Note (Signed)
History and Physical Interval Note:  03/07/2021 9:48 AM  Molly [name]  has presented today for surgery, with the diagnosis of c.  The various methods of treatment have been discussed with the patient and family. After consideration of risks, benefits and other options for treatment, the patient has consented to  Procedure(s): ABDOMINAL AORTOGRAM W/LOWER EXTREMITY (N/A) as a surgical intervention.  The patient's history has been reviewed, patient examined, no change in status, stable for surgery.  I have reviewed the patient's chart and labs.  Questions were answered to the patient's satisfaction.     Servando Snare

## 2021-03-08 ENCOUNTER — Encounter (HOSPITAL_COMMUNITY): Payer: Self-pay | Admitting: Vascular Surgery

## 2021-03-08 DIAGNOSIS — T8249XD Other complication of vascular dialysis catheter, subsequent encounter: Secondary | ICD-10-CM | POA: Diagnosis not present

## 2021-03-08 DIAGNOSIS — N186 End stage renal disease: Secondary | ICD-10-CM | POA: Diagnosis not present

## 2021-03-08 DIAGNOSIS — Z992 Dependence on renal dialysis: Secondary | ICD-10-CM | POA: Diagnosis not present

## 2021-03-08 DIAGNOSIS — D509 Iron deficiency anemia, unspecified: Secondary | ICD-10-CM | POA: Diagnosis not present

## 2021-03-08 DIAGNOSIS — N2581 Secondary hyperparathyroidism of renal origin: Secondary | ICD-10-CM | POA: Diagnosis not present

## 2021-03-08 DIAGNOSIS — I120 Hypertensive chronic kidney disease with stage 5 chronic kidney disease or end stage renal disease: Secondary | ICD-10-CM | POA: Diagnosis not present

## 2021-03-08 DIAGNOSIS — E876 Hypokalemia: Secondary | ICD-10-CM | POA: Diagnosis not present

## 2021-03-08 DIAGNOSIS — D631 Anemia in chronic kidney disease: Secondary | ICD-10-CM | POA: Diagnosis not present

## 2021-03-08 NOTE — Progress Notes (Signed)
Surgical Instructions    Your procedure is scheduled on 03/14/21.  Report to [facility] Main Entrance "A" at 5:30 A.M., then check in with the Admitting office.  Call this number if you have problems the morning of surgery:  [phone]   If you have any questions prior to your surgery date call [phone]: Open Monday-Friday 8am-4pm    Remember:  Do not eat or drink after midnight the night before your surgery      Take these medicines the morning of surgery with A SIP OF WATER  famotidine (PEPCID)  FLUoxetine (PROZAC) isosorbide mononitrate (IMDUR)  IF NEEDED: acetaminophen (TYLENOL)  acetaminophen-codeine (TYLENOL #2) carboxymethylcellul-glycerin (OPTIVE)  levocetirizine (XYZAL)  As of today, STOP taking any  (unless otherwise instructed by your surgeon) Aleve, Naproxen, Ibuprofen, Motrin, Advil, Goody's, BC's, all herbal medications, fish oil, and all vitamins.   YOU WILL NEED TO HOLD YOUR PLETAL (CILOSTAZOL) FOR 5 DAYS BEFORE YOUR PROCEDURE. TAKE YOUR LAST DOSE ON 03/08/21  YOU WILL NEED TO HOLD YOUR ELIQUIS FOR 3 DAYS BEFORE YOUR PROCEDURE. TAKE YOUR LAST DOSE ON 03/10/21.  CONTINUE TAKING ASPIRIN.    HOW TO MANAGE YOUR DIABETES BEFORE AND AFTER SURGERY  Why is it important to control my blood sugar before and after surgery? Improving blood sugar levels before and after surgery helps healing and can limit problems. A way of improving blood sugar control is eating a healthy diet by:  Eating less sugar and carbohydrates  Increasing activity/exercise  Talking with your doctor about reaching your blood sugar goals High blood sugars (greater than 180 mg/dL) can raise your risk of infections and slow your recovery, so you will need to focus on controlling your diabetes during the weeks before surgery. Make sure that the doctor who takes care of your diabetes knows about your planned surgery including the date and location.  How do I manage my blood sugar before  surgery? Check your blood sugar at least 4 times a day, starting 2 days before surgery, to make sure that the level is not too high or low.  Check your blood sugar the morning of your surgery when you wake up and every 2 hours until you get to the Short Stay unit.  If your blood sugar is less than 70 mg/dL, you will need to treat for low blood sugar: Do not take insulin. Treat a low blood sugar (less than 70 mg/dL) with  cup of clear juice (cranberry or apple), 4 glucose tablets, OR glucose gel. Recheck blood sugar in 15 minutes after treatment (to make sure it is greater than 70 mg/dL). If your blood sugar is not greater than 70 mg/dL on recheck, call [phone] for further instructions. Report your blood sugar to the short stay nurse when you get to Short Stay.  If you are admitted to the hospital after surgery: Your blood sugar will be checked by the staff and you will probably be given insulin after surgery (instead of oral diabetes medicines) to make sure you have good blood sugar levels. The goal for blood sugar control after surgery is 80-180 mg/dL.     After your COVID test   You are not required to quarantine however you are required to wear a well-fitting mask when you are out and around people not in your household.  If your mask becomes wet or soiled, replace with a new one.  Wash your hands often with soap and water for 20 seconds or clean your hands with an alcohol-based hand  sanitizer that contains at least 60% alcohol.  Do not share personal items.  Notify your provider: if you are in close contact with someone who has COVID  or if you develop a fever of 100.4 or greater, sneezing, cough, sore throat, shortness of breath or body aches.             Do not wear jewelry or makeup Do not wear lotions, powders, perfumes/colognes, or deodorant. Do not shave 48 hours prior to surgery.  Men may shave face and neck. Do not bring valuables to the hospital. DO Not wear  nail polish, gel polish, artificial nails, or any other type of covering on natural nails including finger and toenails. If patients have artificial nails, gel coating, etc. that need to be removed by a nail salon, please have this removed prior to surgery or surgery may need to be canceled/delayed if the surgeon/ anesthesia feels like the patient is unable to be adequately monitored.             [facility] is not responsible for any belongings or valuables.  Do NOT Smoke (Tobacco/Vaping)  24 hours prior to your procedure  If you use a CPAP at night, you may bring your mask for your overnight stay.   Contacts, glasses, hearing aids, dentures or partials may not be worn into surgery, please bring cases for these belongings   For patients admitted to the hospital, discharge time will be determined by your treatment team.   Patients discharged the day of surgery will not be allowed to drive home, and someone needs to stay with them for 24 hours.  NO VISITORS WILL BE ALLOWED IN PRE-OP WHERE PATIENTS ARE PREPPED FOR SURGERY.  ONLY 1 SUPPORT PERSON MAY BE PRESENT IN THE WAITING ROOM WHILE YOU ARE IN SURGERY.  IF YOU ARE TO BE ADMITTED, ONCE YOU ARE IN YOUR ROOM YOU WILL BE ALLOWED TWO (2) VISITORS. 1 (ONE) VISITOR MAY STAY OVERNIGHT BUT MUST ARRIVE TO THE ROOM BY 8pm.  Minor children may have two parents present. Special consideration for safety and communication needs will be reviewed on a case by case basis.  Special instructions:    Oral Hygiene is also important to reduce your risk of infection.  Remember - BRUSH YOUR TEETH THE MORNING OF SURGERY WITH YOUR REGULAR TOOTHPASTE   [facility]- Preparing For Surgery  Before surgery, you can play an important role. Because skin is not sterile, your skin needs to be as free of germs as possible. You can reduce the number of germs on your skin by washing with CHG (chlorahexidine gluconate) Soap before surgery.  CHG is an antiseptic cleaner which kills  germs and bonds with the skin to continue killing germs even after washing.     Please do not use if you have an allergy to CHG or antibacterial soaps. If your skin becomes reddened/irritated stop using the CHG.  Do not shave (including legs and underarms) for at least 48 hours prior to first CHG shower. It is OK to shave your face.  Please follow these instructions carefully.     Shower the NIGHT BEFORE SURGERY and the MORNING OF SURGERY with CHG Soap.   If you chose to wash your hair, wash your hair first as usual with your normal shampoo. After you shampoo, rinse your hair and body thoroughly to remove the shampoo.  Then [employer] and genitals (private parts) with your normal soap and rinse thoroughly to remove soap.  After that  Use CHG Soap as you would any other liquid soap. You can apply CHG directly to the skin and wash gently with a scrungie or a clean washcloth.   Apply the CHG Soap to your body ONLY FROM THE NECK DOWN.  Do not use on open wounds or open sores. Avoid contact with your eyes, ears, mouth and genitals (private parts). Wash Face and genitals (private parts)  with your normal soap.   Wash thoroughly, paying special attention to the area where your surgery will be performed.  Thoroughly rinse your body with warm water from the neck down.  DO NOT shower/wash with your normal soap after using and rinsing off the CHG Soap.  Pat yourself dry with a CLEAN TOWEL.  Wear CLEAN PAJAMAS to bed the night before surgery  Place CLEAN SHEETS on your bed the night before your surgery  DO NOT SLEEP WITH PETS.   Day of Surgery: Take a shower with CHG soap. Wear Clean/Comfortable clothing the morning of surgery Do not apply any deodorants/lotions.   Remember to brush your teeth WITH YOUR REGULAR TOOTHPASTE.   Please read over the following fact sheets that you were given.

## 2021-03-08 NOTE — Progress Notes (Deleted)
Cardiology Office Note    Date:  03/08/2021   ID:  Molly, [name] [birth year], MRN [id number]  PCP:  [name], MD  Cardiologist:  Jenne Campus, MD  Electrophysiologist:  None   Chief Complaint: ***  History of Present Illness:   Molly [name] is a 70 y.o. 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  She was previously followed by Dr. Agustin Cree as well as Dr. Gwenlyn Found for PAD.  Patient underwent peripheral angiography in 08/2019 which showed CTO of bilateral SFAs as well as occluded of popliteal and tibial vessels on the left and tibial disease on the right. She was also found to have a 90% left renal artery stenosis. Intervention was attempted but was unsuccessful. She was started on Pletal with marked improvement in claudication.  Dopplers in 09/2019 revealed normal renal pole to pole with dimensions suggesting that the 90% left renal artery stenosis on angiography was not physiologically significant. In the interim she has had multiple admissions for hypertensive urgency, respiratory failure requiring BiPAP, chest pain, and CHF. During a prior admissino cath was considered but deferred due to renal insufficiency. During her 12/2020 admission she was found to have elevated troponin and echo showing drop in EF to 40-45% with global HK, moderately elevated PASP, mild mR, trivial pericardial effusion. Hospitalization complicated by AF RVR, AKI requiring hemodialysis, anemia, and fall with hip fracture requiring repair. EGD reported without evidence of bleeding; gastritis was seen on CT scan; GI cleared to restart Eliquis. Dr. Audie Box raised question of whether her L RAS was causing her episodes of flash pulmonary edema and recommended OP f/u. She has not  yet been back to see [place] in the office as she no-showed for follow-up. We recently received clearance request for cystoscopy ureteroscopy laser lithotripsy with stent exchange and so pre-op appt was scheduled. In the interim, she recently underwent PV angiography for chronic limb threatening ischemia on 03/07/21 for left lower extremity with wounds (pressure ulcer on her left heel and toe ulceration on the left great toe). Vascular surgery is hopeful to plan left femoral to anterior tibial artery bypass surgery on Monday 03/14/21. There is no date listed on the original 02/23/21 clearance for the cystoscopy ureteroscopy laser lithotripsy with stent exchange with [facility].    Preop cardiovascular examination for PAD, now followed by [facility] Chronic combined CHF with cardiomyopathy not evaluated for ischemia previously Paroxysmal atrial fibrillation Essential HTN Hyperlipidemia with abnormal LFTs in 12/2020  Labwork independently reviewed: 02/2021 istat Cr 2.7, K 5.0 01/2021 Hgb 8.2  12/2020 Mg 1.9, AST/ALT elevated 08/2020 LDL 107   Past Medical History:  Diagnosis Date   Abdominal aortic atherosclerosis with stenosis 06/29/2015   AKI (acute kidney injury) ([place]) 06/29/2015   Atherosclerosis of coronary artery without angina pectoris 04/21/2020   Atopic dermatitis 04/21/2020   Benign hypertension with CKD (chronic kidney disease) stage III ([place]) 04/21/2020   Bilateral impacted cerumen 08/21/2019   Chest pain 08/31/2020   Cholecystitis 06/29/2015   Cholelithiasis 06/29/2015   Claudication in peripheral vascular disease ([place]) 08/28/2019   Peripheral arterial disease   Colon cancer screening 04/21/2020   Dehydration with hyponatremia 06/29/2015   Diabetes mellitus ([place]) 07/15/2018   Diarrhea 04/21/2020   DKA (diabetic ketoacidoses) 06/29/2015   Dyslipidemia 08/30/2018   Elevated troponin  Epigastric pain 04/21/2020   Gastroesophageal reflux disease 04/21/2020   HTN (hypertension) 06/29/2015   Hyperglycemia  due to type 2 diabetes mellitus ([place]) 04/21/2020   Hypertension    Hypertensive emergency 07/12/2020   Hypertensive heart disease without congestive heart failure 04/21/2020   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   Proptosis 04/21/2020   Proximal leg weakness 09/10/2018   Ptosis of left eyelid 09/10/2018   Renal artery stenosis ([place]) 12/17/2019   Renal artery stenosis   Standard chest x-ray abnormal 04/21/2020   Vitamin D deficiency 04/21/2020   Weight loss 04/21/2020    Past Surgical History:  Procedure Laterality Date   ABDOMINAL AORTOGRAM W/LOWER EXTREMITY N/A 08/28/2019   Procedure: ABDOMINAL AORTOGRAM W/LOWER EXTREMITY;  Surgeon: Lorretta Harp, MD;  Location: [facility];  Service: Cardiovascular;  Laterality: N/A;   ABDOMINAL AORTOGRAM W/LOWER EXTREMITY Bilateral 03/07/2021   Procedure: ABDOMINAL AORTOGRAM W/LOWER EXTREMITY;  Surgeon: Waynetta Sandy, MD;  Location: [facility];  Service: Cardiovascular;  Laterality: Bilateral;   AV FISTULA PLACEMENT Left 01/10/2021   Procedure: LEFT ARM BRACHIOCEPHALIC  ARTERIOVENOUS (AV) FISTULA CREATION;  Surgeon: Cherre Robins, MD;  Location: [facility];  Service: Vascular;  Laterality: Left;   BIOPSY  01/05/2021   Procedure: BIOPSY;  Surgeon: Gatha Mayer, MD;  Location: [facility];  Service: Endoscopy;;   CATARACT EXTRACTION, BILATERAL     CHOLECYSTECTOMY N/A 06/30/2015   Procedure:  LAPAROSCOPIC CHOLECYSTECTOMY WITH INTRAOPERATIVE CHOLANGIOGRAM;  Surgeon: Donnie Mesa, MD;  Location: [facility];  Service: General;  Laterality: N/A;   CYSTOSCOPY W/ URETERAL STENT PLACEMENT Left 12/30/2020   Procedure: CYSTOSCOPY WITH RETROGRADE PYELOGRAM/URETERAL STENT PLACEMENT;  Surgeon: Janith Lima, MD;  Location: [facility];  Service: Urology;  Laterality: Left;   ESOPHAGOGASTRODUODENOSCOPY (EGD) WITH PROPOFOL N/A 01/05/2021   Procedure: ESOPHAGOGASTRODUODENOSCOPY (EGD) WITH PROPOFOL;  Surgeon: Gatha Mayer, MD;  Location: [facility];  Service: Endoscopy;  Laterality: N/A;   INSERTION OF DIALYSIS CATHETER N/A 01/10/2021   Procedure: INSERTION OF TUNNELED DIALYSIS CATHETER RIGHT INTERNAL JUGULAR;  Surgeon: Cherre Robins, MD;  Location: [facility];  Service: Vascular;  Laterality: N/A;   INTRAMEDULLARY (IM) NAIL INTERTROCHANTERIC Left 01/06/2021   Procedure: INTRAMEDULLARY (IM) NAIL INTERTROCHANTRIC;  Surgeon: Erle Crocker, MD;  Location: [facility];  Service: Orthopedics;  Laterality: Left;   IR FLUORO GUIDE CV LINE RIGHT  01/01/2021   IR US GUIDE VASC ACCESS RIGHT  01/01/2021   PERIPHERAL VASCULAR BALLOON ANGIOPLASTY Right 08/28/2019   Procedure: PERIPHERAL VASCULAR BALLOON ANGIOPLASTY;  Surgeon: Lorretta Harp, MD;  Location: [facility];  Service: Cardiovascular;  Laterality: Right;  attempted SFA   REMOVAL OF A DIALYSIS CATHETER Right 01/10/2021   Procedure: REMOVAL OF TEMPORARY DIALYSIS CATHETER RIGHT GROIN;  Surgeon: Cherre Robins, MD;  Location: [facility];  Service: Vascular;  Laterality: Right;   THROMBECTOMY BRACHIAL ARTERY  01/10/2021   Procedure: THROMBECTOMY LEFT CEPHALIC VEIN;  Surgeon: Cherre Robins, MD;  Location[facility];  Service: Vascular;;    Current Medications: No outpatient medications have been marked as taking for the 03/09/21 encounter (Appointment) with Charlie Pitter, PA-C.   ***   Allergies:   Lisinopril and Mercury   Social History   Socioeconomic  History   Marital status: Married    Spouse name: Caylie Sandquist   Number of children: 2   Years of education: Not on file   Highest education level: Bachelor's degree (e.g., BA, AB, BS)  Occupational History   Occupation: retired    Comment: Med Tech  Tobacco Use   Smoking status: Never   Smokeless tobacco: Never  Vaping Use   Vaping Use: Never used  Substance and Sexual Activity   Alcohol use: No   Drug use: Never   Sexual activity: Not on file  Other Topics Concern   Not on file  Social History Narrative   Not on file   Social Determinants of Health   Financial Resource Strain: Low Risk    Difficulty of Paying Living Expenses: Not very hard  Food Insecurity: No Food Insecurity   Worried About Charity fundraiser in the Last Year: Never true   Ran Out of Food in the Last Year: Never true  Transportation Needs: No Transportation Needs   Lack of Transportation (Medical): No   Lack of Transportation (Non-Medical): No  Physical Activity: Not on file  Stress: Not on file  Social Connections: Not on file     Family History:  The patient's ***family history includes Breast cancer in her mother; Hypertension in her father and mother; Pancreatic cancer in an other family member.  ROS:   Please see the history of present illness. Otherwise, review of systems is positive for ***.  All other systems are reviewed and otherwise negative.    EKGs/Labs/Other Studies Reviewed:    Studies reviewed are outlined and summarized above. Reports included below if pertinent.  ***    EKG:  EKG is ordered today, personally reviewed, demonstrating ***  Recent Labs: 11/29/2020: TSH 2.532 12/27/2020: B Natriuretic Peptide 1,102.1 01/05/2021: ALT 79; Magnesium 1.9 01/17/2021: Platelets 308 03/07/2021: BUN 29; Creatinine, Ser 2.70; Hemoglobin 13.6; Potassium 5.0; Sodium 137  Recent Lipid Panel    Component Value Date/Time   CHOL 190 08/31/2020 0930   CHOL 116 12/10/2019 1136   TRIG 61  08/31/2020 0930   HDL 71 08/31/2020 0930   HDL 52 12/10/2019 1136   CHOLHDL 2.7 08/31/2020 0930   VLDL 12 08/31/2020 0930   LDLCALC 107 (H) 08/31/2020 0930   LDLCALC 46 12/10/2019 1136    PHYSICAL EXAM:    VS:  There were no vitals taken for this visit.  BMI: There is no height or weight on file to calculate BMI.  GEN: Well nourished, well developed female in no acute distress HEENT: normocephalic, atraumatic Neck: no JVD, carotid bruits, or masses Cardiac: ***RRR; no murmurs, rubs, or gallops, no edema  Respiratory:  clear to auscultation bilaterally, normal work of breathing GI: soft, nontender, nondistended, + BS MS: no deformity or atrophy Skin: warm and dry, no rash Neuro:  Alert and Oriented x 3, Strength and sensation are intact, follows commands Psych: euthymic mood, full affect  Wt Readings from Last 3 Encounters:  03/07/21 138 lb 0.1 oz (62.6 kg)  03/04/21 138 lb (62.6 kg)  03/01/21 138 lb (62.6 kg)     ASSESSMENT & PLAN:   ***     Disposition: F/u with ***  Medication Adjustments/Labs and Tests Ordered: Current medicines are reviewed at length with the patient today.  Concerns regarding medicines are outlined above. Medication changes, Labs and Tests ordered today are summarized above and listed in the Patient Instructions accessible in Encounters.   Signed, Charlie Pitter, PA-C  03/08/2021 7:44 AM    [address], [place], [place]  [address] Phone: [phone]; Fax: [phone]

## 2021-03-09 ENCOUNTER — Encounter (HOSPITAL_COMMUNITY)
Admission: RE | Admit: 2021-03-09 | Discharge: 2021-03-09 | Disposition: A | Discharge status: Home / Self Care | Payer: Medicare Other | Source: Ambulatory Visit | Attending: Vascular Surgery | Admitting: Vascular Surgery

## 2021-03-09 ENCOUNTER — Encounter (HOSPITAL_COMMUNITY): Payer: Self-pay

## 2021-03-09 ENCOUNTER — Ambulatory Visit: Payer: Medicare Other | Admitting: Physician Assistant

## 2021-03-09 ENCOUNTER — Ambulatory Visit (INDEPENDENT_AMBULATORY_CARE_PROVIDER_SITE_OTHER): Payer: Medicare Other | Admitting: Cardiovascular Disease

## 2021-03-09 ENCOUNTER — Encounter: Payer: Self-pay | Admitting: Cardiovascular Disease

## 2021-03-09 ENCOUNTER — Other Ambulatory Visit: Payer: Self-pay

## 2021-03-09 ENCOUNTER — Encounter: Payer: Self-pay | Admitting: Physician Assistant

## 2021-03-09 VITALS — BP 160/60 | HR 64 | Temp 98.2°F | Resp 18 | Ht 62.0 in

## 2021-03-09 VITALS — BP 190/66 | HR 60 | Ht 62.0 in

## 2021-03-09 DIAGNOSIS — Z794 Long term (current) use of insulin: Secondary | ICD-10-CM | POA: Insufficient documentation

## 2021-03-09 DIAGNOSIS — I5022 Chronic systolic (congestive) heart failure: Secondary | ICD-10-CM

## 2021-03-09 DIAGNOSIS — Z01810 Encounter for preprocedural cardiovascular examination: Secondary | ICD-10-CM | POA: Diagnosis not present

## 2021-03-09 DIAGNOSIS — I739 Peripheral vascular disease, unspecified: Secondary | ICD-10-CM

## 2021-03-09 DIAGNOSIS — I10 Essential (primary) hypertension: Secondary | ICD-10-CM | POA: Diagnosis not present

## 2021-03-09 DIAGNOSIS — I70222 Atherosclerosis of native arteries of extremities with rest pain, left leg: Secondary | ICD-10-CM | POA: Insufficient documentation

## 2021-03-09 DIAGNOSIS — I5042 Chronic combined systolic (congestive) and diastolic (congestive) heart failure: Secondary | ICD-10-CM

## 2021-03-09 DIAGNOSIS — Z01818 Encounter for other preprocedural examination: Secondary | ICD-10-CM | POA: Insufficient documentation

## 2021-03-09 DIAGNOSIS — G4733 Obstructive sleep apnea (adult) (pediatric): Secondary | ICD-10-CM | POA: Diagnosis not present

## 2021-03-09 DIAGNOSIS — D649 Anemia, unspecified: Secondary | ICD-10-CM | POA: Diagnosis not present

## 2021-03-09 DIAGNOSIS — N1832 Chronic kidney disease, stage 3b: Secondary | ICD-10-CM | POA: Diagnosis not present

## 2021-03-09 DIAGNOSIS — Z7901 Long term (current) use of anticoagulants: Secondary | ICD-10-CM | POA: Diagnosis not present

## 2021-03-09 DIAGNOSIS — Z79899 Other long term (current) drug therapy: Secondary | ICD-10-CM | POA: Diagnosis not present

## 2021-03-09 DIAGNOSIS — Z7982 Long term (current) use of aspirin: Secondary | ICD-10-CM | POA: Insufficient documentation

## 2021-03-09 DIAGNOSIS — I132 Hypertensive heart and chronic kidney disease with heart failure and with stage 5 chronic kidney disease, or end stage renal disease: Secondary | ICD-10-CM | POA: Insufficient documentation

## 2021-03-09 DIAGNOSIS — I480 Paroxysmal atrial fibrillation: Secondary | ICD-10-CM

## 2021-03-09 DIAGNOSIS — E785 Hyperlipidemia, unspecified: Secondary | ICD-10-CM | POA: Insufficient documentation

## 2021-03-09 DIAGNOSIS — E1122 Type 2 diabetes mellitus with diabetic chronic kidney disease: Secondary | ICD-10-CM | POA: Diagnosis not present

## 2021-03-09 LAB — COMPREHENSIVE METABOLIC PANEL
ALT: 17 U/L (ref 0–44)
AST: 29 U/L (ref 15–41)
Albumin: 3.1 g/dL — ABNORMAL LOW (ref 3.5–5.0)
Alkaline Phosphatase: 91 U/L (ref 38–126)
Anion gap: 10 (ref 5–15)
BUN: 13 mg/dL (ref 8–23)
CO2: 29 mmol/L (ref 22–32)
Calcium: 8.6 mg/dL — ABNORMAL LOW (ref 8.9–10.3)
Chloride: 97 mmol/L — ABNORMAL LOW (ref 98–111)
Creatinine, Ser: 2.84 mg/dL — ABNORMAL HIGH (ref 0.44–1.00)
GFR, Estimated: 18 mL/min — ABNORMAL LOW (ref >60)
Glucose, Bld: 121 mg/dL — ABNORMAL HIGH (ref 70–99)
Potassium: 3.7 mmol/L (ref 3.5–5.1)
Sodium: 136 mmol/L (ref 135–145)
Total Bilirubin: 0.9 mg/dL (ref 0.3–1.2)
Total Protein: 6.9 g/dL (ref 6.5–8.1)

## 2021-03-09 LAB — URINALYSIS, ROUTINE W REFLEX MICROSCOPIC
Bilirubin Urine: NEGATIVE
Glucose, UA: NEGATIVE mg/dL
Ketones, ur: NEGATIVE mg/dL
Nitrite: NEGATIVE
Protein, ur: 100 mg/dL — AB
Specific Gravity, Urine: >1.046 — ABNORMAL HIGH (ref 1.005–1.030)
pH: 5 (ref 5.0–8.0)

## 2021-03-09 LAB — CBC
HCT: 41.7 % (ref 36.0–46.0)
Hemoglobin: 12.6 g/dL (ref 12.0–15.0)
MCH: 27.5 pg (ref 26.0–34.0)
MCHC: 30.2 g/dL (ref 30.0–36.0)
MCV: 90.8 fL (ref 80.0–100.0)
Platelets: 230 10*3/uL (ref 150–400)
RBC: 4.59 MIL/uL (ref 3.87–5.11)
RDW: 17.7 % — ABNORMAL HIGH (ref 11.5–15.5)
WBC: 7.5 10*3/uL (ref 4.0–10.5)
nRBC: 0 % (ref 0.0–0.2)

## 2021-03-09 LAB — PROTIME-INR
INR: 1.1 (ref 0.8–1.2)
Prothrombin Time: 14.6 seconds (ref 11.4–15.2)

## 2021-03-09 LAB — HEMOGLOBIN A1C
Hgb A1c MFr Bld: 4.5 % — ABNORMAL LOW (ref 4.8–5.6)
Mean Plasma Glucose: 82.45 mg/dL

## 2021-03-09 LAB — GLUCOSE, CAPILLARY: Glucose-Capillary: 88 mg/dL (ref 70–99)

## 2021-03-09 LAB — SURGICAL PCR SCREEN
MRSA, PCR: NEGATIVE
Staphylococcus aureus: NEGATIVE

## 2021-03-09 LAB — APTT: aPTT: 31 seconds (ref 24–36)

## 2021-03-09 MED ORDER — ISOSORBIDE MONONITRATE ER 60 MG PO TB24: 60.0000 mg | ORAL_TABLET | Freq: Every day | ORAL | 1 refills | Status: AC

## 2021-03-09 MED ORDER — CARVEDILOL 25 MG PO TABS: 25.0000 mg | ORAL_TABLET | Freq: Two times a day (BID) | ORAL | 3 refills | Status: AC

## 2021-03-09 NOTE — Progress Notes (Signed)
Per blood bank, pt needs new T&S on DOS.  Also, Melina Copa, Cardiology PA said they were able to see pt today for pre-op clearance evaluation at 1530

## 2021-03-09 NOTE — Patient Instructions (Signed)
Medication Instructions:  STOP Metoprolol  START Coreg 25 twice daily  Increase Imdur 60 mg daily   Hold Eliquis 2 days before surgery   *If you need a refill on your cardiac medications before your next appointment, please call your pharmacy*  Follow-Up: At [facility], you and your health needs are our priority.  As part of our continuing mission to provide you with exceptional heart care, we have created designated Provider Care Teams.  These Care Teams include your primary Cardiologist (physician) and Advanced Practice Providers (APPs -  Physician Assistants and Nurse Practitioners) who all work together to provide you with the care you need, when you need it.  We recommend signing up for the patient portal called "MyChart".  Sign up information is provided on this After Visit Summary.  MyChart is used to connect with patients for Virtual Visits (Telemedicine).  Patients are able to view lab/test results, encounter notes, upcoming appointments, etc.  Non-urgent messages can be sent to your provider as well.   To learn more about what you can do with MyChart, go to [email or web address].    Your next appointment:   3 month(s)  The format for your next appointment:   In Person  Provider:   Eleonore Chiquito, MD

## 2021-03-09 NOTE — Progress Notes (Signed)
Pt missed pre-op clearance appt with Melina Copa today. I have been on the phone with their office. They are trying to see if they can get her in today or Friday but they say it does not look like it's going to be possible. They will contact pt about possibly having to reschedule her surgery because she will need pre-op clearance. Jeneen Rinks, Anesthesia PA is aware.

## 2021-03-09 NOTE — Progress Notes (Signed)
Anesthesia Chart Review:  Case: [id number] Date/Time: 03/14/21 0715   Procedure: LEFT FEMORAL-ANTERIOR TIBIAL ARTERY BYPASS (Left)   Anesthesia type: General   Pre-op diagnosis: Critical limb ischemia of left lower extremity   Location: [facility] OR ROOM 16 / [place] OR   Surgeons: Cherre Robins, MD       DISCUSSION: Patient is a 70 year old female scheduled for the above procedure.  History includes never smoker, ESRD (started dialysis 01/01/21), renal artery stenosis (left), PAF, chronic systolic and diastolic CHF, DM2, HTN, HLD, PAD, anemia, left hip fracture (s/p cephalomedullary nail 01/06/21). Mild OSA by 02/2021 home sleep test.  -  admission 12/27/20-01/17/21 for hypertensive crisis, volume overload/flash pulmonary edema, and acute on chronic kidney failure. Briefly required BiPAP. Developed afib with RVR, controlled on amiodarone. Newly reduced LVEF 40-45%, likely from arrhythmia (echo done while in rapid afib). Ischemic evaluation largely deferred due worsening renal function and concerns for sepsis. S/p left ureteral stent 12/30/20 for obstructive uropathy, but renal function did note improve. S/p right femoral non-tunneled dialysis catheter 01/01/21, started dialysis. GI consulted for anemia. Received 2 units PRBC. EGD 01/05/21 with erythematous mucosa without significant abnormality on pathology.  She fell during admission and sustained a left hip fracture, s/p nailing 01/06/21. S/p right IJ [facility] and left brachiocephalic AVF creation on 9/47/09. Out-patient hemodialysis arranged at [facility].    She was seen on 03/09/21 by Dr. Audie Box for a preoperative evaluation. Note reviewed. He wrote, "... I see no need to delay this for stress testing.  I believe she is moderate to high risk given her underlying ESRD as well as systolic heart and has not been evaluated since her hospitalization.  However given the rather urgent nature of her surgery I would recommend she proceed she is currently  optimized." He advised holding Eliquis for 2 days prior to surgery. 3 month follow-up planned.   Hold Eliquis for 3 days, Pletal for 5 days, and continue ASA per VVS (see Letters tab).   Preoperative COVID-19 testing scheduled for 03/11/2021.  Anesthesia team to evaluate on the day of surgery.  Per blood bank, patient will need new T&S on the day of surgery. [id number] also ordered given ESRD.   VS: BP (!) 160/60 Comment: manuel right arm  Pulse 64   Temp 36.8 C (Oral)   Resp 18   Ht 5\' 2"  (1.575 m)   SpO2 100%   BMI 25.24 kg/m    PROVIDERS: [name], MD is PCP  Jenne Campus, MD is cardiologist Rexene Alberts, MD is urologist Red Christians, MD is nephrologist. After 12/2020 admission, hemodialysis arranged at [place].  Scarlette Shorts, MD is GI  Larey Days, MD is pulmonologist   LABS: Preoperative labs noted. Cr 2.84, but known ESRD. (all labs ordered are listed, but only abnormal results are displayed)  Labs Reviewed  CBC - Abnormal; Notable for the following components:      Result Value   RDW 17.7 (*)    All other components within normal limits  COMPREHENSIVE METABOLIC PANEL - Abnormal; Notable for the following components:   Chloride 97 (*)    Glucose, Bld 121 (*)    Creatinine, Ser 2.84 (*)    Calcium 8.6 (*)    Albumin 3.1 (*)    GFR, Estimated 18 (*)    All other components within normal limits  URINALYSIS, ROUTINE W REFLEX MICROSCOPIC - Abnormal; Notable for the following components:   Color, Urine AMBER (*)  APPearance CLOUDY (*)    Specific Gravity, Urine >1.046 (*)    Hgb urine dipstick MODERATE (*)    Protein, ur 100 (*)    Leukocytes,Ua MODERATE (*)    Bacteria, UA FEW (*)    Non Squamous Epithelial 0-5 (*)    All other components within normal limits  HEMOGLOBIN A1C - Abnormal; Notable for the following components:   Hgb A1c MFr Bld 4.5 (*)    All other components within normal limits  SURGICAL PCR SCREEN  GLUCOSE,  CAPILLARY  PROTIME-INR  APTT     OTHER: Home Sleep Study 02/23/21: Impressions: Mild OSA (AHI of 8.0/hr of sleep) with oxygen desaturation.  Nocturnal hypoxemia in the absence of respiratory events suggesting underlying cardiopulmonary disease.  Recommendations: Options of treatment for mild OSA include oral appliance or CPAP therapy...    EGD 01/05/21: IMPRESSION: - Non-obstructing Schatzki ring. - Gastroesophageal flap valve classified as Hill Grade III (minimal fold, loose to endoscope, hiatal hernia likely). - 1 cm hiatal hernia. - Erythematous mucosa in the stomach. Biopsied. [Pathology: gastric mucosa, no significant abnormality.  No inflammation, dysplasia, or malignancy.]  - Normal examined duodenum.  PFTs 10/15/20: FVC 1.16 (52%), post 1.28 (58%). FEV1 0.95 (55%), post 1.08 (63%). DLCOL unc 7.67 (41%), cor 8.23 (44%).    IMAGES: 1V PCXR 01/10/21: FINDINGS: Large-bore dialysis catheter with tip in the RIGHT atrium. Low lung volumes. Mild central venous congestion which is improved from comparison exam. Mild LEFT basilar atelectasis. No pneumothorax IMPRESSION:.  NO PNEUMOTHORAX 1. Interval placement dialysis catheter without complication. 2. Improvement venous congestion.     EKG: 03/09/21: Sinus bradycardia Left anterior fascicular block Cannot rule out Inferior infarct (masked by fascicular block?) , age undetermined Anterolateral infarct , age undetermined Abnormal ECG Since last tracing Sinus bradycardia has replaced Atrial fibrillation Confirmed by Nelva Bush [phone]) on 03/09/2021 4:54:06 PM   CV: Echo (limited) 12/28/20: IMPRESSIONS   1. Compared with the echo 04/7406, systolic function is reduced. Left  ventricular ejection fraction, by estimation, is 40 to 45%. The left  ventricle has mildly decreased function. The left ventricle demonstrates  global hypokinesis. Left ventricular  diastolic parameters are indeterminate.   2. Right ventricular  systolic function is normal. The right ventricular  size is normal. There is moderately elevated pulmonary artery systolic  pressure.   3. The pericardial effusion is circumferential.   4. The mitral valve is normal in structure. Mild mitral valve  regurgitation. No evidence of mitral stenosis.   5. The aortic valve is tricuspid. There is mild calcification of the  aortic valve. There is mild thickening of the aortic valve. Aortic valve  regurgitation is not visualized. No aortic stenosis is present.   6. The inferior vena cava is dilated in size with <50% respiratory  variability, suggesting right atrial pressure of 15 mmHg.   Echo 11/27/20: IMPRESSIONS   1. Left ventricular ejection fraction, by estimation, is 60 to 65%. The  left ventricle has normal function. The left ventricle has no regional  wall motion abnormalities. There is mild left ventricular hypertrophy.  Left ventricular diastolic parameters  are consistent with Grade II diastolic dysfunction (pseudonormalization).  Elevated left atrial pressure.   2. Right ventricular systolic function is normal. The right ventricular  size is normal. There is normal pulmonary artery systolic pressure.   3. A small pericardial effusion is present.   4. The mitral valve is normal in structure. No evidence of mitral valve  regurgitation. No evidence of mitral stenosis.  5. The aortic valve is tricuspid. Aortic valve regurgitation is not  visualized. Mild aortic valve sclerosis is present, with no evidence of  aortic valve stenosis.   6. The inferior vena cava is normal in size with greater than 50%  respiratory variability, suggesting right atrial pressure of 3 mmHg.    Holter monitor 04/09/18: Date of test:                 04/09/2018 Duration of test:           7 days Indication:                    Palpitations   Baseline rhythm: Sinus Minimum heart rate: 51 BPM.  Average heart rate: 63 BPM.  Maximal heart rate 84 BPM. Atrial  arrhythmia: Infrequent premature supraventricular beats with 2 runs of narrow complex tachycardia longest 1 9 beats at 128 bpm  Ventricular arrhythmia: Infrequent PVCs with few couplets and triplets Conduction abnormality: None Symptoms: None  Conclusion:  Benign 7 days monitor with very low heart rate variability To episode of narrow complex tachycardia longest episodes 9 beats    Last stress test > 3 years ago.   Past Medical History:  Diagnosis Date   Abdominal aortic atherosclerosis with stenosis 06/29/2015   Abnormal LFTs    Anemia    Atopic dermatitis 04/21/2020   Bilateral impacted cerumen 08/21/2019   Cholecystitis 06/29/2015   Cholelithiasis 06/29/2015   Chronic combined systolic (congestive) and diastolic (congestive) heart failure (HCC)    Claudication in peripheral vascular disease ([place]) 08/28/2019   Peripheral arterial disease   Colon cancer screening 04/21/2020   Dehydration with hyponatremia 06/29/2015   Diabetes mellitus ([place]) 07/15/2018   DKA (diabetic ketoacidoses) 06/29/2015   Dyslipidemia 08/30/2018   Elevated troponin    End stage renal disease (HCC)    Epigastric pain 04/21/2020   Gastroesophageal reflux disease 04/21/2020   HTN (hypertension) 06/29/2015   Hyperglycemia due to type 2 diabetes mellitus ([place]) 04/21/2020   Hypertensive emergency 07/12/2020   Hypertensive heart disease without congestive heart failure 04/21/2020   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   Irritable bowel syndrome with diarrhea 10/02/2018   Leukocytosis 06/29/2015   Long term (current) use of insulin ([place]) 04/21/2020   Loss of appetite 04/21/2020   Nausea 04/21/2020   Occlusion and stenosis of bilateral carotid arteries 04/21/2020   Osteopenia 10/02/2018   Other dysphagia 09/10/2018   Peripheral vascular disease ([place]) 07/15/2018   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   Proptosis 04/21/2020    Proximal leg weakness 09/10/2018   Ptosis of left eyelid 09/10/2018   Renal artery stenosis ([place]) 12/17/2019   Renal artery stenosis   Standard chest x-ray abnormal 04/21/2020   Vitamin D deficiency 04/21/2020   Weight loss 04/21/2020    Past Surgical History:  Procedure Laterality Date   ABDOMINAL AORTOGRAM W/LOWER EXTREMITY N/A 08/28/2019   Procedure: ABDOMINAL AORTOGRAM W/LOWER EXTREMITY;  Surgeon: Lorretta Harp, MD;  Location: [place] [facility];  Service: Cardiovascular;  Laterality: N/A;   ABDOMINAL AORTOGRAM W/LOWER EXTREMITY Bilateral 03/07/2021   Procedure: ABDOMINAL AORTOGRAM W/LOWER EXTREMITY;  Surgeon: Waynetta Sandy, MD;  Location: [place] [facility];  Service: Cardiovascular;  Laterality: Bilateral;   AV FISTULA PLACEMENT Left 01/10/2021   Procedure: LEFT ARM BRACHIOCEPHALIC  ARTERIOVENOUS (AV) FISTULA CREATION;  Surgeon: Cherre Robins, MD;  Location: [place];  Service: Vascular;  Laterality: Left;   BIOPSY  01/05/2021   Procedure: BIOPSY;  Surgeon: Gatha Mayer, MD;  Location: [place];  Service: Endoscopy;;   CATARACT EXTRACTION, BILATERAL     CHOLECYSTECTOMY N/A 06/30/2015   Procedure: LAPAROSCOPIC CHOLECYSTECTOMY WITH INTRAOPERATIVE CHOLANGIOGRAM;  Surgeon: Donnie Mesa, MD;  Location: [place];  Service: General;  Laterality: N/A;   CYSTOSCOPY W/ URETERAL STENT PLACEMENT Left 12/30/2020   Procedure: CYSTOSCOPY WITH RETROGRADE PYELOGRAM/URETERAL STENT PLACEMENT;  Surgeon: Janith Lima, MD;  Location: [place];  Service: Urology;  Laterality: Left;   ESOPHAGOGASTRODUODENOSCOPY (EGD) WITH PROPOFOL N/A 01/05/2021   Procedure: ESOPHAGOGASTRODUODENOSCOPY (EGD) WITH PROPOFOL;  Surgeon: Gatha Mayer, MD;  Location: [place];  Service: Endoscopy;  Laterality: N/A;   INSERTION OF DIALYSIS CATHETER N/A 01/10/2021   Procedure: INSERTION OF TUNNELED DIALYSIS CATHETER RIGHT INTERNAL JUGULAR;  Surgeon: Cherre Robins, MD;  Location: [place];  Service: Vascular;   Laterality: N/A;   INTRAMEDULLARY (IM) NAIL INTERTROCHANTERIC Left 01/06/2021   Procedure: INTRAMEDULLARY (IM) NAIL INTERTROCHANTRIC;  Surgeon: Erle Crocker, MD;  Location: [place];  Service: Orthopedics;  Laterality: Left;   IR FLUORO GUIDE CV LINE RIGHT  01/01/2021   IR US GUIDE VASC ACCESS RIGHT  01/01/2021   PERIPHERAL VASCULAR BALLOON ANGIOPLASTY Right 08/28/2019   Procedure: PERIPHERAL VASCULAR BALLOON ANGIOPLASTY;  Surgeon: Lorretta Harp, MD;  Location: [place] [facility];  Service: Cardiovascular;  Laterality: Right;  attempted SFA   REMOVAL OF A DIALYSIS CATHETER Right 01/10/2021   Procedure: REMOVAL OF TEMPORARY DIALYSIS CATHETER RIGHT GROIN;  Surgeon: Cherre Robins, MD;  Location: [facility];  Service: Vascular;  Laterality: Right;   THROMBECTOMY BRACHIAL ARTERY  01/10/2021   Procedure: THROMBECTOMY LEFT CEPHALIC VEIN;  Surgeon: Cherre Robins, MD;  Location: [facility];  Service: Vascular;;    MEDICATIONS:  acetaminophen (TYLENOL) 325 MG tablet   acetaminophen-codeine (TYLENOL #2) 300-15 MG tablet   amiodarone (PACERONE) 200 MG tablet   apixaban (ELIQUIS) 5 MG TABS tablet   carboxymethylcellul-glycerin (OPTIVE) 0.5-0.9 % ophthalmic solution   Cholecalciferol 25 MCG (1000 UT) tablet   cilostazol (PLETAL) 50 MG tablet   dronabinol (MARINOL) 2.5 MG capsule   ezetimibe (ZETIA) 10 MG tablet   famotidine (PEPCID) 20 MG tablet   FLUoxetine (PROZAC) 10 MG capsule   hydrocortisone cream 1 %   isosorbide mononitrate (IMDUR) 30 MG 24 hr tablet   ketoconazole (NIZORAL) 2 % cream   levocetirizine (XYZAL) 5 MG tablet   loperamide (IMODIUM) 1 MG/5ML solution   metoprolol succinate (TOPROL-XL) 50 MG 24 hr tablet   rosuvastatin (CRESTOR) 10 MG tablet   No current facility-administered medications for this encounter.    Myra Gianotti, PA-C Surgical Short Stay/Anesthesiology [facility] Phone [phone] [facility] Phone [phone] 03/09/2021 7:44 PM

## 2021-03-09 NOTE — Progress Notes (Signed)
PCP - Dr. [name] Cardiologist - Dr. Jenne Campus  PPM/ICD - denies   Chest x-ray - 01/10/21 EKG - 03/09/21 at PAT Stress Test - 4-5 years ago per pt- no abnormalities ECHO - 12/28/20 Cardiac Cath - denies  Sleep Study - pt had one on 02/23/21, results pending. CPAP - not at this time  DM- Type 2 Fasting Blood Sugar - 80-110 Checks Blood Sugar every other day  Blood Thinner Instructions: Hold Eliquis 3 days, Hold Pletal 5 days Aspirin Instructions: n/a  ERAS Protcol - NPO   COVID TEST- pt scheduled for 11/25   Anesthesia review: yes, pt needs pre-op clearance- missed appt  Patient denies shortness of breath, fever, cough and chest pain at PAT appointment   All instructions explained to the patient, with a verbal understanding of the material. Patient agrees to go over the instructions while at home for a better understanding. Patient also instructed to wear a mask in public after being tested for COVID-19. The opportunity to ask questions was provided.

## 2021-03-09 NOTE — Telephone Encounter (Signed)
DOD @ our [facility] office had an acute opening for today @ 3:30, pt will come in for that.  Will route back to the surgeon's office to make them aware.

## 2021-03-09 NOTE — Anesthesia Preprocedure Evaluation (Addendum)
Anesthesia Evaluation  Patient identified by MRN, date of birth, ID band Patient awake    Reviewed: Allergy & Precautions, NPO status , Patient's Chart, lab work & pertinent test results, reviewed documented beta blocker date and time   Airway Mallampati: II  TM Distance: >3 FB Neck ROM: Full    Dental  (+) Teeth Intact, Dental Advisory Given   Pulmonary neg pulmonary ROS,    Pulmonary exam normal breath sounds clear to auscultation       Cardiovascular hypertension, Pt. on home beta blockers and Pt. on medications + CAD, + Peripheral Vascular Disease and +CHF  Normal cardiovascular exam+ dysrhythmias Atrial Fibrillation  Rhythm:Regular Rate:Normal  Echo (limited) 12/28/20: IMPRESSIONS  1. Compared with the echo 11/8500, systolic function is reduced. Left  ventricular ejection fraction, by estimation, is 40 to 45%. The left  ventricle has mildly decreased function. The left ventricle demonstrates  global hypokinesis. Left ventricular  diastolic parameters are indeterminate.  2. Right ventricular systolic function is normal. The right ventricular  size is normal. There is moderately elevated pulmonary artery systolic  pressure.  3. The pericardial effusion is circumferential.  4. The mitral valve is normal in structure. Mild mitral valve  regurgitation. No evidence of mitral stenosis.  5. The aortic valve is tricuspid. There is mild calcification of the  aortic valve. There is mild thickening of the aortic valve. Aortic valve  regurgitation is not visualized. No aortic stenosis is present.  6. The inferior vena cava is dilated in size with <50% respiratory  variability, suggesting right atrial pressure of 15 mmHg.    Neuro/Psych PSYCHIATRIC DISORDERS Depression  Neuromuscular disease    GI/Hepatic Neg liver ROS, GERD  Medicated,  Endo/Other  diabetes, Type 2, Insulin Dependent  Renal/GU ESRF, Renal hypertension and  DialysisRenal disease (started dialysis 01/01/21; K+ 3.9)     Musculoskeletal negative musculoskeletal ROS (+)   Abdominal   Peds  Hematology  (+) Blood dyscrasia (Eliquis), ,   Anesthesia Other Findings   Reproductive/Obstetrics                           Anesthesia Physical Anesthesia Plan  ASA: 3  Anesthesia Plan: General   Post-op Pain Management:    Induction: Intravenous  PONV Risk Score and Plan: 3 and Dexamethasone and Ondansetron  Airway Management Planned: Oral ETT  Additional Equipment: Arterial line  Intra-op Plan:   Post-operative Plan: Extubation in OR  Informed Consent: I have reviewed the patients History and Physical, chart, labs and discussed the procedure including the risks, benefits and alternatives for the proposed anesthesia with the patient or authorized representative who has indicated his/her understanding and acceptance.     Dental advisory given  Plan Discussed with: CRNA  Anesthesia Plan Comments: (PAT note written 03/09/2021 by Myra Gianotti, PA-C.  2nd PIV)      Anesthesia Quick Evaluation

## 2021-03-09 NOTE — Telephone Encounter (Signed)
Pt missed her appointment for surgical clearance today and has been rescheduled for Monday, 03/14/21.  I will route the clearance to the provider she is scheduled to see on Monday and back to the requesting surgeon's office to make them aware pt will need to postpone her procedure until she has been cleared.

## 2021-03-09 NOTE — Progress Notes (Signed)
Pt no-showed for f/u visit today. Also missed prior post-hospital appt as well. Original appt was scheduled for  cystoscopy ureteroscopy laser lithotripsy with stent exchange with [facility], but in the interim she also has been diagnosed with critical limb ischemia pending left fem to anterior tibial artery bypass which is scheduled for Monday. We were able to get her on the schedule to see Dr. Audie Box at 3:30pm who is familiar with her from last [place]. I notified Mendel Corning, RN with preadmissions team.

## 2021-03-09 NOTE — Progress Notes (Signed)
Cardiology Office Note:   Date:  03/09/2021  NAME:  Molly [name]    MRN: [id number] DOB:  1950/10/14   PCP:  [name], MD  Cardiologist:  Jenne Campus, MD  Electrophysiologist:  None   Referring MD: Bobette Mo*   Chief Complaint  Patient presents with   Pre-op Exam   History of Present Illness:   Molly [name] is a 70 y.o. female with a hx of ESRD on HD, pAF, systolic HF (94-07%), DM, HTN, HLD, anemia who presents for follow-up.  Recently admitted to [facility] between 12/27/2020 and 01/17/2021.  She was admitted with hypertensive crisis and flash pulmonary edema.  Course was complicated by worsening kidney function that resulted in her being placed on hemodialysis.  She is now currently on dialysis.  She also suffered from paroxysmal atrial fibrillation that was controlled on amiodarone.  Found to have a newly reduced ejection fraction of 40 to 45%.  Ischemia evaluation and cardiac evaluation were largely deferred due to initiation of hemodialysis and concerns for sepsis.  Her hospital course was further complicated by anemia where she was found to have gastritis on EGD.  She also suffered a fall during that admission and underwent hip replacement surgery.   Since surgery she has done well.  She is had no further chest pain or trouble breathing.  She remains with hemodialysis.  This likely will be a permanent issue for her.  She has been diagnosed with a nonhealing foot ulcer.  This is in the left heel.  She actually underwent peripheral angiography and was found to have severe critical PAD.  She will undergo left femoral to anterior tibial bypass surgery on 03/14/2021.  She reports she cannot walk.  She reports the ulcer is quite painful.  It appears her surgery is rather urgent.  She denies any chest pain or trouble breathing.  Her blood pressure severely elevated at 190/66.  Medication shows she is only on metoprolol and Imdur.  She is unsure if she is  taking hydralazine.  EKG earlier today shows she is maintaining sinus rhythm.  She is off amiodarone.  This was plan for short course in the setting of A. fib diagnosed during critical illness.  That EKG demonstrates sinus bradycardia with left anterior fascicular block.  She has no symptoms of congestive heart failure.  Volume status is controlled hemodialysis.  She is on Eliquis.  We discussed this will need to be stopped prior to surgery.  Given the critical limb ischemia she likely has I see no need to delay her surgery.    Problem List HTN ESRD on HD Paroxysmal Afib  PAD -bilateral SFA CTO -critical limb ischemia L leg DM -A1c 4.5 Anemia -gastritis on EGD 7. Systolic HF, EF 68-08% 8. HLD -T chol 190, LDL 107, HDL 71, TG 61   Past Medical History: Past Medical History:  Diagnosis Date   Abdominal aortic atherosclerosis with stenosis 06/29/2015   Abnormal LFTs    Anemia    Atopic dermatitis 04/21/2020   Bilateral impacted cerumen 08/21/2019   Cholecystitis 06/29/2015   Cholelithiasis 06/29/2015   Chronic combined systolic (congestive) and diastolic (congestive) heart failure (HCC)    Claudication in peripheral vascular disease ([place]) 08/28/2019   Peripheral arterial disease   Colon cancer screening 04/21/2020   Dehydration with hyponatremia 06/29/2015   Diabetes mellitus ([place]) 07/15/2018   DKA (diabetic ketoacidoses) 06/29/2015   Dyslipidemia 08/30/2018   Elevated troponin    End stage renal disease (  [place])    Epigastric pain 04/21/2020   Gastroesophageal reflux disease 04/21/2020   HTN (hypertension) 06/29/2015   Hyperglycemia due to type 2 diabetes mellitus ([place]) 04/21/2020   Hypertensive emergency 07/12/2020   Hypertensive heart disease without congestive heart failure 04/21/2020   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   Irritable bowel syndrome with diarrhea 10/02/2018   Leukocytosis 06/29/2015   Long term  (current) use of insulin ([place]) 04/21/2020   Loss of appetite 04/21/2020   Nausea 04/21/2020   Occlusion and stenosis of bilateral carotid arteries 04/21/2020   Osteopenia 10/02/2018   Other dysphagia 09/10/2018   Peripheral vascular disease ([place]) 07/15/2018   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   Proptosis 04/21/2020   Proximal leg weakness 09/10/2018   Ptosis of left eyelid 09/10/2018   Renal artery stenosis (HCC) 12/17/2019   Renal artery stenosis   Standard chest x-ray abnormal 04/21/2020   Vitamin D deficiency 04/21/2020   Weight loss 04/21/2020    Past Surgical History: Past Surgical History:  Procedure Laterality Date   ABDOMINAL AORTOGRAM W/LOWER EXTREMITY N/A 08/28/2019   Procedure: ABDOMINAL AORTOGRAM W/LOWER EXTREMITY;  Surgeon: Lorretta Harp, MD;  Location: [place] [facility];  Service: Cardiovascular;  Laterality: N/A;   ABDOMINAL AORTOGRAM W/LOWER EXTREMITY Bilateral 03/07/2021   Procedure: ABDOMINAL AORTOGRAM W/LOWER EXTREMITY;  Surgeon: Waynetta Sandy, MD;  Location: [place] [facility];  Service: Cardiovascular;  Laterality: Bilateral;   AV FISTULA PLACEMENT Left 01/10/2021   Procedure: LEFT ARM BRACHIOCEPHALIC  ARTERIOVENOUS (AV) FISTULA CREATION;  Surgeon: Cherre Robins, MD;  Location: [place];  Service: Vascular;  Laterality: Left;   BIOPSY  01/05/2021   Procedure: BIOPSY;  Surgeon: Gatha Mayer, MD;  Location: [place];  Service: Endoscopy;;   CATARACT EXTRACTION, BILATERAL     CHOLECYSTECTOMY N/A 06/30/2015   Procedure: LAPAROSCOPIC CHOLECYSTECTOMY WITH INTRAOPERATIVE CHOLANGIOGRAM;  Surgeon: Donnie Mesa, MD;  Location: [place];  Service: General;  Laterality: N/A;   CYSTOSCOPY W/ URETERAL STENT PLACEMENT Left 12/30/2020   Procedure: CYSTOSCOPY WITH RETROGRADE PYELOGRAM/URETERAL STENT PLACEMENT;  Surgeon: Janith Lima, MD;  Location: [place];  Service: Urology;  Laterality: Left;   ESOPHAGOGASTRODUODENOSCOPY (EGD) WITH  PROPOFOL N/A 01/05/2021   Procedure: ESOPHAGOGASTRODUODENOSCOPY (EGD) WITH PROPOFOL;  Surgeon: Gatha Mayer, MD;  Location: [place];  Service: Endoscopy;  Laterality: N/A;   INSERTION OF DIALYSIS CATHETER N/A 01/10/2021   Procedure: INSERTION OF TUNNELED DIALYSIS CATHETER RIGHT INTERNAL JUGULAR;  Surgeon: Cherre Robins, MD;  Location: [place];  Service: Vascular;  Laterality: N/A;   INTRAMEDULLARY (IM) NAIL INTERTROCHANTERIC Left 01/06/2021   Procedure: INTRAMEDULLARY (IM) NAIL INTERTROCHANTRIC;  Surgeon: Erle Crocker, MD;  Location: [place];  Service: Orthopedics;  Laterality: Left;   IR FLUORO GUIDE CV LINE RIGHT  01/01/2021   IR US GUIDE VASC ACCESS RIGHT  01/01/2021   PERIPHERAL VASCULAR BALLOON ANGIOPLASTY Right 08/28/2019   Procedure: PERIPHERAL VASCULAR BALLOON ANGIOPLASTY;  Surgeon: Lorretta Harp, MD;  Location: [place] [facility];  Service: Cardiovascular;  Laterality: Right;  attempted SFA   REMOVAL OF A DIALYSIS CATHETER Right 01/10/2021   Procedure: REMOVAL OF TEMPORARY DIALYSIS CATHETER RIGHT GROIN;  Surgeon: Cherre Robins, MD;  Location: [place];  Service: Vascular;  Laterality: Right;   THROMBECTOMY BRACHIAL ARTERY  01/10/2021   Procedure: THROMBECTOMY LEFT CEPHALIC VEIN;  Surgeon: Cherre Robins, MD;  Location: [facility];  Service: Vascular;;    Current Medications: Current Meds  Medication Sig   acetaminophen (TYLENOL) 325 MG tablet Take 2 tablets (650 mg total) by mouth every 4 (four) hours as needed for headache or mild pain.   acetaminophen-codeine (TYLENOL #2) 300-15 MG tablet Take 1 tablet by mouth every 6 (six) hours as needed.   apixaban (ELIQUIS) 5 MG TABS tablet Take 1 tablet (5 mg total) by mouth 2 (two) times daily.   carboxymethylcellul-glycerin (OPTIVE) 0.5-0.9 % ophthalmic solution Place 1-2 drops into both eyes 3 (three) times daily as needed for dry eyes.   carvedilol (COREG) 25 MG tablet Take 1 tablet (25 mg total) by mouth 2 (two) times daily.    Cholecalciferol 25 MCG (1000 UT) tablet Take 2,000 Units by mouth every evening.   cilostazol (PLETAL) 50 MG tablet Take 1 tablet (50 mg total) by mouth 2 (two) times daily.   ezetimibe (ZETIA) 10 MG tablet Take 1 tablet (10 mg total) by mouth daily. (Patient taking differently: Take 10 mg by mouth every evening.)   famotidine (PEPCID) 20 MG tablet Take 20 mg by mouth in the morning and at bedtime.   FLUoxetine (PROZAC) 10 MG capsule Take 1 capsule (10 mg total) by mouth daily.   hydrALAZINE (APRESOLINE) 25 MG tablet Take 25 mg by mouth 3 (three) times daily.   hydrocortisone cream 1 % Apply 1 application topically 4 (four) times daily as needed for itching.   ketoconazole (NIZORAL) 2 % cream Apply 1 application topically daily as needed for irritation.    levocetirizine (XYZAL) 5 MG tablet Take 5 mg by mouth daily as needed for allergies.   loperamide (IMODIUM) 1 MG/5ML solution Take 2 mg by mouth as needed for diarrhea or loose stools.   rosuvastatin (CRESTOR) 10 MG tablet Take 1 tablet (10 mg total) by mouth daily. (Patient taking differently: Take 10 mg by mouth every evening.)   [DISCONTINUED] isosorbide mononitrate (IMDUR) 30 MG 24 hr tablet Take 1 tablet (30 mg total) by mouth daily.   [DISCONTINUED] metoprolol succinate (TOPROL-XL) 50 MG 24 hr tablet Take 1 tablet (50 mg total) by mouth daily. Take with or immediately following a meal. (Patient taking differently: Take 50 mg by mouth daily after supper. Take with or immediately following a meal.)     Allergies:    Lisinopril and Mercury   Social History: Social History   Socioeconomic History   Marital status: Married    Spouse name: Elfreda Blanchet   Number of children: 2   Years of education: Not on file   Highest education level: Bachelor's degree (e.g., BA, AB, BS)  Occupational History   Occupation: retired    Comment: Med Tech  Tobacco Use   Smoking status: Never   Smokeless tobacco: Never  Vaping Use   Vaping Use: Never used   Substance and Sexual Activity   Alcohol use: No   Drug use: Never   Sexual activity: Not on file  Other Topics Concern   Not on file  Social History Narrative   Not on file   Social Determinants of Health   Financial Resource Strain: Low Risk    Difficulty of Paying Living Expenses: Not very hard  Food Insecurity: No Food Insecurity   Worried About Charity fundraiser in the Last Year: Never true   Ran Out of Food in the Last Year: Never true  Transportation Needs: No Transportation Needs   Lack of Transportation (Medical): No   Lack of Transportation (Non-Medical): No  Physical Activity: Not on file  Stress:  Not on file  Social Connections: Not on file     Family History: The patient's family history includes Breast cancer in her mother; Hypertension in her father and mother; Pancreatic cancer in an other family member.  ROS:   All other ROS reviewed and negative. Pertinent positives noted in the HPI.     EKGs/Labs/Other Studies Reviewed:   The following studies were personally reviewed by me today:  EKG:  EKG reviewed in office today from her preoperative assessment by anesthesiology.  That EKG demonstrates sinus bradycardia heart rate 59.  She has left intrafascicular block with nonspecific ST-T changes.  TTE 12/28/2020  1. Compared with the echo 09/2945, systolic function is reduced. Left  ventricular ejection fraction, by estimation, is 40 to 45%. The left  ventricle has mildly decreased function. The left ventricle demonstrates  global hypokinesis. Left ventricular  diastolic parameters are indeterminate.   2. Right ventricular systolic function is normal. The right ventricular  size is normal. There is moderately elevated pulmonary artery systolic  pressure.   3. The pericardial effusion is circumferential.   4. The mitral valve is normal in structure. Mild mitral valve  regurgitation. No evidence of mitral stenosis.   5. The aortic valve is tricuspid. There is  mild calcification of the  aortic valve. There is mild thickening of the aortic valve. Aortic valve  regurgitation is not visualized. No aortic stenosis is present.   6. The inferior vena cava is dilated in size with <50% respiratory  variability, suggesting right atrial pressure of 15 mmHg.   Recent Labs: 11/29/2020: TSH 2.532 12/27/2020: B Natriuretic Peptide 1,102.1 01/05/2021: Magnesium 1.9 03/09/2021: ALT 17; BUN 13; Creatinine, Ser 2.84; Hemoglobin 12.6; Platelets 230; Potassium 3.7; Sodium 136   Recent Lipid Panel    Component Value Date/Time   CHOL 190 08/31/2020 0930   CHOL 116 12/10/2019 1136   TRIG 61 08/31/2020 0930   HDL 71 08/31/2020 0930   HDL 52 12/10/2019 1136   CHOLHDL 2.7 08/31/2020 0930   VLDL 12 08/31/2020 0930   LDLCALC 107 (H) 08/31/2020 0930   LDLCALC 46 12/10/2019 1136    Physical Exam:   VS:  BP (!) 190/66   Pulse 60   Ht 5\' 2"  (1.575 m)   SpO2 96%   BMI 25.24 kg/m    Wt Readings from Last 3 Encounters:  03/07/21 138 lb 0.1 oz (62.6 kg)  03/04/21 138 lb (62.6 kg)  03/01/21 138 lb (62.6 kg)    General: Well nourished, well developed, in no acute distress Head: Atraumatic, normal size  Eyes: PEERLA, EOMI  Neck: Supple, no JVD Endocrine: No thryomegaly Cardiac: Normal S1, S2; RRR; no murmurs, rubs, or gallops Lungs: Clear to auscultation bilaterally, no wheezing, rhonchi or rales  Abd: Soft, nontender, no hepatomegaly  Ext: No edema, pulses 2+ Musculoskeletal: No deformities, BUE and BLE strength normal and equal Skin: Warm and dry, no rashes   Neuro: Alert and oriented to person, place, time, and situation, CNII-XII grossly intact, no focal deficits  Psych: Normal mood and affect   ASSESSMENT:   Molly [name] is a 70 y.o. female who presents for the following: 1. Preoperative cardiovascular examination   2. Peripheral vascular disease ([place])   3. Chronic systolic heart failure ([place])   4. Primary hypertension   5. Paroxysmal atrial  fibrillation (HCC)     PLAN:   1. Preoperative cardiovascular examination 2. Peripheral vascular disease ([place]) -She will undergo left femoral to anterior tibial bypass surgery  on 03/14/2021.  This is due to a nonhealing foot ulcer in the left lower extremity.  She cannot bear weight on the extremity.  It appears her situation is quite severe as she likely has critical limb ischemia.  If this surgery does not work she informed me she will need amputation. -She is without symptoms of heart failure.  She is now hemodialysis.  She describes no symptoms of angina. -I do her surgery is urgent if not emergent.  I see no need to delay this for stress testing.  I believe she is moderate to high risk given her underlying ESRD as well as systolic heart and has not been evaluated since her hospitalization.  However given the rather urgent nature of her surgery I would recommend she proceed she is currently optimized. -She will need to continue her blood pressure medications.  She will need to hold Eliquis 2 days before surgery. -Cardiology consult team is available if needed while she is in-house. -There was discussion of renal artery stenosis.  She was having recurrent episodes of flash pulmonary edema.  Given that she is on ESRD I really do not see a need to evaluate this further.  I will see her back in 3 months to discuss further.  3. Chronic systolic heart failure (HCC) -Diagnosed with systolic heart failure EF 40 to 45% recently.  This was in the setting of volume overload as well as critical illness that required initiation of hemodialysis.  She also was in rapid A. fib when her echocardiogram was obtained. -She describes no symptoms of angina.  Her EKG is without acute ischemic changes. -I suspect her EF drop is likely related to arrhythmia.  She is euvolemic on exam and clinically optimized from a volume standpoint for surgery.  Given her urgent need for surgery would recommend she proceed. -Blood  pressures are elevated.  We will stop metoprolol.  Start Coreg 25 mg twice daily.  We will make sure she is on hydralazine 25 mg 3 times daily.  Increase Imdur to 60 mg daily for better blood pressure control. -No ACE/ARB/Arni/MRA given ESRD on hemodialysis. -I will plan to see her back in 3 months and we will repeat an echocardiogram.  Hopefully since her critical illness has resolved she will hopefully have improvement in her LVEF. -Volume status is maintained on hemodialysis.  4. Primary hypertension -Changed to Coreg 25 mg twice daily.  Increase Imdur to 60 mg daily.  Hydralazine 25 mg 3 times daily.  5. Paroxysmal atrial fibrillation (HCC) -Diagnosed with atrial fibrillation in the hospital.  Placed on brief amiodarone to get her through her critical illness.  No recurrence of A. fib.  Stop amiodarone. -She is on Eliquis 5 mg twice daily.  She will stop this 2 days prior to surgery.  Restart at the discretion of vascular surgery. -Continue beta-blocker for now.     Disposition: Return in about 3 months (around 06/09/2021).  Medication Adjustments/Labs and Tests Ordered: Current medicines are reviewed at length with the patient today.  Concerns regarding medicines are outlined above.  No orders of the defined types were placed in this encounter.  Meds ordered this encounter  Medications   carvedilol (COREG) 25 MG tablet    Sig: Take 1 tablet (25 mg total) by mouth 2 (two) times daily.    Dispense:  180 tablet    Refill:  3   isosorbide mononitrate (IMDUR) 60 MG 24 hr tablet    Sig: Take 1 tablet (60 mg total)  by mouth daily.    Dispense:  90 tablet    Refill:  1    Patient Instructions  Medication Instructions:  STOP Metoprolol  START Coreg 25 twice daily  Increase Imdur 60 mg daily   Hold Eliquis 2 days before surgery   *If you need a refill on your cardiac medications before your next appointment, please call your pharmacy*  Follow-Up: At [facility], you and your  health needs are our priority.  As part of our continuing mission to provide you with exceptional heart care, we have created designated Provider Care Teams.  These Care Teams include your primary Cardiologist (physician) and Advanced Practice Providers (APPs -  Physician Assistants and Nurse Practitioners) who all work together to provide you with the care you need, when you need it.  We recommend signing up for the patient portal called "MyChart".  Sign up information is provided on this After Visit Summary.  MyChart is used to connect with patients for Virtual Visits (Telemedicine).  Patients are able to view lab/test results, encounter notes, upcoming appointments, etc.  Non-urgent messages can be sent to your provider as well.   To learn more about what you can do with MyChart, go to [email or web address].    Your next appointment:   3 month(s)  The format for your next appointment:   In Person  Provider:   Eleonore Chiquito, MD     Time Spent with Patient: I have spent a total of 35 minutes with patient reviewing hospital notes, telemetry, EKGs, labs and examining the patient as well as establishing an assessment and plan that was discussed with the patient.  > 50% of time was spent in direct patient care.  Signed, Addison Naegeli. Audie Box, MD, [place]  [address], [place], [place] [address] [phone]  03/09/2021 4:26 PM

## 2021-03-11 ENCOUNTER — Other Ambulatory Visit (HOSPITAL_COMMUNITY)
Admission: RE | Admit: 2021-03-11 | Discharge: 2021-03-11 | Disposition: A | Discharge status: Home / Self Care | Payer: Medicare Other | Source: Ambulatory Visit | Attending: Vascular Surgery | Admitting: Vascular Surgery

## 2021-03-11 DIAGNOSIS — D509 Iron deficiency anemia, unspecified: Secondary | ICD-10-CM | POA: Diagnosis not present

## 2021-03-11 DIAGNOSIS — Z992 Dependence on renal dialysis: Secondary | ICD-10-CM | POA: Diagnosis not present

## 2021-03-11 DIAGNOSIS — N2581 Secondary hyperparathyroidism of renal origin: Secondary | ICD-10-CM | POA: Diagnosis not present

## 2021-03-11 DIAGNOSIS — I480 Paroxysmal atrial fibrillation: Secondary | ICD-10-CM | POA: Diagnosis not present

## 2021-03-11 DIAGNOSIS — Z01812 Encounter for preprocedural laboratory examination: Secondary | ICD-10-CM | POA: Insufficient documentation

## 2021-03-11 DIAGNOSIS — E11621 Type 2 diabetes mellitus with foot ulcer: Secondary | ICD-10-CM | POA: Diagnosis not present

## 2021-03-11 DIAGNOSIS — I120 Hypertensive chronic kidney disease with stage 5 chronic kidney disease or end stage renal disease: Secondary | ICD-10-CM | POA: Diagnosis not present

## 2021-03-11 DIAGNOSIS — T8249XD Other complication of vascular dialysis catheter, subsequent encounter: Secondary | ICD-10-CM | POA: Diagnosis not present

## 2021-03-11 DIAGNOSIS — I70212 Atherosclerosis of native arteries of extremities with intermittent claudication, left leg: Secondary | ICD-10-CM | POA: Diagnosis not present

## 2021-03-11 DIAGNOSIS — F410 Panic disorder [episodic paroxysmal anxiety] without agoraphobia: Secondary | ICD-10-CM | POA: Diagnosis present

## 2021-03-11 DIAGNOSIS — E1151 Type 2 diabetes mellitus with diabetic peripheral angiopathy without gangrene: Secondary | ICD-10-CM | POA: Diagnosis not present

## 2021-03-11 DIAGNOSIS — Z888 Allergy status to other drugs, medicaments and biological substances status: Secondary | ICD-10-CM | POA: Diagnosis not present

## 2021-03-11 DIAGNOSIS — Z20822 Contact with and (suspected) exposure to covid-19: Secondary | ICD-10-CM | POA: Insufficient documentation

## 2021-03-11 DIAGNOSIS — D631 Anemia in chronic kidney disease: Secondary | ICD-10-CM | POA: Diagnosis not present

## 2021-03-11 DIAGNOSIS — I959 Hypotension, unspecified: Secondary | ICD-10-CM | POA: Diagnosis not present

## 2021-03-11 DIAGNOSIS — I129 Hypertensive chronic kidney disease with stage 1 through stage 4 chronic kidney disease, or unspecified chronic kidney disease: Secondary | ICD-10-CM | POA: Diagnosis not present

## 2021-03-11 DIAGNOSIS — E1122 Type 2 diabetes mellitus with diabetic chronic kidney disease: Secondary | ICD-10-CM | POA: Diagnosis not present

## 2021-03-11 DIAGNOSIS — I70202 Unspecified atherosclerosis of native arteries of extremities, left leg: Secondary | ICD-10-CM | POA: Diagnosis not present

## 2021-03-11 DIAGNOSIS — Z800 Family history of malignant neoplasm of digestive organs: Secondary | ICD-10-CM | POA: Diagnosis not present

## 2021-03-11 DIAGNOSIS — E876 Hypokalemia: Secondary | ICD-10-CM | POA: Diagnosis not present

## 2021-03-11 DIAGNOSIS — I998 Other disorder of circulatory system: Secondary | ICD-10-CM | POA: Diagnosis not present

## 2021-03-11 DIAGNOSIS — I70244 Atherosclerosis of native arteries of left leg with ulceration of heel and midfoot: Secondary | ICD-10-CM | POA: Diagnosis not present

## 2021-03-11 DIAGNOSIS — L89623 Pressure ulcer of left heel, stage 3: Secondary | ICD-10-CM | POA: Diagnosis not present

## 2021-03-11 DIAGNOSIS — Z8249 Family history of ischemic heart disease and other diseases of the circulatory system: Secondary | ICD-10-CM | POA: Diagnosis not present

## 2021-03-11 DIAGNOSIS — E559 Vitamin D deficiency, unspecified: Secondary | ICD-10-CM | POA: Diagnosis not present

## 2021-03-11 DIAGNOSIS — E785 Hyperlipidemia, unspecified: Secondary | ICD-10-CM | POA: Diagnosis not present

## 2021-03-11 DIAGNOSIS — L97529 Non-pressure chronic ulcer of other part of left foot with unspecified severity: Secondary | ICD-10-CM | POA: Diagnosis not present

## 2021-03-11 DIAGNOSIS — Z803 Family history of malignant neoplasm of breast: Secondary | ICD-10-CM | POA: Diagnosis not present

## 2021-03-11 DIAGNOSIS — L97422 Non-pressure chronic ulcer of left heel and midfoot with fat layer exposed: Secondary | ICD-10-CM | POA: Diagnosis not present

## 2021-03-11 DIAGNOSIS — N186 End stage renal disease: Secondary | ICD-10-CM | POA: Diagnosis not present

## 2021-03-11 DIAGNOSIS — M8580 Other specified disorders of bone density and structure, unspecified site: Secondary | ICD-10-CM | POA: Diagnosis not present

## 2021-03-11 DIAGNOSIS — I739 Peripheral vascular disease, unspecified: Secondary | ICD-10-CM | POA: Diagnosis not present

## 2021-03-11 DIAGNOSIS — K219 Gastro-esophageal reflux disease without esophagitis: Secondary | ICD-10-CM | POA: Diagnosis not present

## 2021-03-11 DIAGNOSIS — Z794 Long term (current) use of insulin: Secondary | ICD-10-CM | POA: Diagnosis not present

## 2021-03-11 DIAGNOSIS — Z9109 Other allergy status, other than to drugs and biological substances: Secondary | ICD-10-CM | POA: Diagnosis not present

## 2021-03-11 DIAGNOSIS — I5042 Chronic combined systolic (congestive) and diastolic (congestive) heart failure: Secondary | ICD-10-CM | POA: Diagnosis not present

## 2021-03-11 DIAGNOSIS — Z01818 Encounter for other preprocedural examination: Secondary | ICD-10-CM

## 2021-03-11 DIAGNOSIS — I132 Hypertensive heart and chronic kidney disease with heart failure and with stage 5 chronic kidney disease, or end stage renal disease: Secondary | ICD-10-CM | POA: Diagnosis not present

## 2021-03-11 DIAGNOSIS — I2510 Atherosclerotic heart disease of native coronary artery without angina pectoris: Secondary | ICD-10-CM | POA: Diagnosis not present

## 2021-03-11 LAB — SARS CORONAVIRUS 2 (TAT 6-24 HRS): SARS Coronavirus 2: NEGATIVE

## 2021-03-13 DIAGNOSIS — N2581 Secondary hyperparathyroidism of renal origin: Secondary | ICD-10-CM | POA: Diagnosis not present

## 2021-03-13 DIAGNOSIS — D631 Anemia in chronic kidney disease: Secondary | ICD-10-CM | POA: Diagnosis not present

## 2021-03-13 DIAGNOSIS — I120 Hypertensive chronic kidney disease with stage 5 chronic kidney disease or end stage renal disease: Secondary | ICD-10-CM | POA: Diagnosis not present

## 2021-03-13 DIAGNOSIS — Z992 Dependence on renal dialysis: Secondary | ICD-10-CM | POA: Diagnosis not present

## 2021-03-13 DIAGNOSIS — E876 Hypokalemia: Secondary | ICD-10-CM | POA: Diagnosis not present

## 2021-03-13 DIAGNOSIS — T8249XD Other complication of vascular dialysis catheter, subsequent encounter: Secondary | ICD-10-CM | POA: Diagnosis not present

## 2021-03-13 DIAGNOSIS — N186 End stage renal disease: Secondary | ICD-10-CM | POA: Diagnosis not present

## 2021-03-13 DIAGNOSIS — D509 Iron deficiency anemia, unspecified: Secondary | ICD-10-CM | POA: Diagnosis not present

## 2021-03-13 NOTE — Progress Notes (Deleted)
Cardiology Office Note:    Date:  03/13/2021   ID:  CELESE [name], DOB 1950-05-07, MRN [id number]  PCP:  [name], MD   [facility] Providers Cardiologist:  Jenne Campus, MD { Click to update primary MD,subspecialty MD or APP then REFRESH:1}    Referring MD: [name],*   Follow-up for hypertension and preoperative cardiac evaluation.  History of Present Illness:    Molly [name] is a 70 y.o. female with a hx of end-stage renal disease on HD, permanent atrial fibrillation, systolic CHF EF 01-60%, diabetes, hypertension, hyperlipidemia, and anemia.  She was admitted to [facility] 12/27/2020 until 01/17/2021.  She was admitted with hypertensive crisis and noted to have flash pulmonary edema.  Her course was complicated by worsening kidney function.  She was placed on hemodialysis.  She has continued on dialysis.  She was also noted to suffer from paroxysmal atrial fibrillation which was controlled on amiodarone.  She was noted to have a reduced ejection fraction of 40-45%.  An ischemic evaluation and cardiac evaluation were deferred due to her HD and concerns for sepsis.  Her hospital course was further complicated by her anemia.  On EGD she was found to have gastritis.  She also suffered from a fall during her admission and underwent surgery for hip replacement.  She followed up with Dr. Audie Box on 03/09/2021.  During that time she had done well.  She denied further episodes of chest discomfort and trouble with her breathing.  She remained on hemodialysis.  It was felt that she would likely remain on hemodialysis.  She had also been diagnosed with nonhealing foot ulcer.  The ulcer was present on her left heel.  She underwent peripheral angiography and was found to have severe critical PAD.  She was scheduled to undergo left femoral to anterior tibial bypass 03/14/2021.  She reported that she could not walk.  She noted pain related to her ulcer.  Her blood  pressure was elevated at 190/66.  She reported compliance with her metoprolol and Imdur.  She did not know whether she was taking hydralazine.  Her EKG showed sinus rhythm.  She was off amiodarone.  She continued on apixaban and denies bleeding issues.  She presents to the clinic today for follow-up evaluation states***  *** denies chest pain, shortness of breath, lower extremity edema, fatigue, palpitations, melena, hematuria, hemoptysis, diaphoresis, weakness, presyncope, syncope, orthopnea, and PND.   Past Medical History:  Diagnosis Date   Abdominal aortic atherosclerosis with stenosis 06/29/2015   Abnormal LFTs    Anemia    Atopic dermatitis 04/21/2020   Bilateral impacted cerumen 08/21/2019   Cholecystitis 06/29/2015   Cholelithiasis 06/29/2015   Chronic combined systolic (congestive) and diastolic (congestive) heart failure (HCC)    Claudication in peripheral vascular disease ([place]) 08/28/2019   Peripheral arterial disease   Colon cancer screening 04/21/2020   Dehydration with hyponatremia 06/29/2015   Diabetes mellitus ([place]) 07/15/2018   DKA (diabetic ketoacidoses) 06/29/2015   Dyslipidemia 08/30/2018   Elevated troponin    End stage renal disease (HCC)    Epigastric pain 04/21/2020   Gastroesophageal reflux disease 04/21/2020   HTN (hypertension) 06/29/2015   Hyperglycemia due to type 2 diabetes mellitus ([place]) 04/21/2020   Hypertensive emergency 07/12/2020   Hypertensive heart disease without congestive heart failure 04/21/2020   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   Irritable bowel syndrome with diarrhea 10/02/2018   Leukocytosis 06/29/2015   Long term (current)  use of insulin ([place]) 04/21/2020   Loss of appetite 04/21/2020   Nausea 04/21/2020   Occlusion and stenosis of bilateral carotid arteries 04/21/2020   Osteopenia 10/02/2018   Other dysphagia 09/10/2018   Peripheral vascular disease ([place]) 07/15/2018    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   Proptosis 04/21/2020   Proximal leg weakness 09/10/2018   Ptosis of left eyelid 09/10/2018   Renal artery stenosis (HCC) 12/17/2019   Renal artery stenosis   Standard chest x-ray abnormal 04/21/2020   Vitamin D deficiency 04/21/2020   Weight loss 04/21/2020    Past Surgical History:  Procedure Laterality Date   ABDOMINAL AORTOGRAM W/LOWER EXTREMITY N/A 08/28/2019   Procedure: ABDOMINAL AORTOGRAM W/LOWER EXTREMITY;  Surgeon: Lorretta Harp, MD;  Location: [place] [facility];  Service: Cardiovascular;  Laterality: N/A;   ABDOMINAL AORTOGRAM W/LOWER EXTREMITY Bilateral 03/07/2021   Procedure: ABDOMINAL AORTOGRAM W/LOWER EXTREMITY;  Surgeon: Waynetta Sandy, MD;  Location: [place] [facility];  Service: Cardiovascular;  Laterality: Bilateral;   AV FISTULA PLACEMENT Left 01/10/2021   Procedure: LEFT ARM BRACHIOCEPHALIC  ARTERIOVENOUS (AV) FISTULA CREATION;  Surgeon: Cherre Robins, MD;  Location: [employer];  Service: Vascular;  Laterality: Left;   BIOPSY  01/05/2021   Procedure: BIOPSY;  Surgeon: Gatha Mayer, MD;  Location: [place];  Service: Endoscopy;;   CATARACT EXTRACTION, BILATERAL     CHOLECYSTECTOMY N/A 06/30/2015   Procedure: LAPAROSCOPIC CHOLECYSTECTOMY WITH INTRAOPERATIVE CHOLANGIOGRAM;  Surgeon: Donnie Mesa, MD;  Location: [place];  Service: General;  Laterality: N/A;   CYSTOSCOPY W/ URETERAL STENT PLACEMENT Left 12/30/2020   Procedure: CYSTOSCOPY WITH RETROGRADE PYELOGRAM/URETERAL STENT PLACEMENT;  Surgeon: Janith Lima, MD;  Location: [place];  Service: Urology;  Laterality: Left;   ESOPHAGOGASTRODUODENOSCOPY (EGD) WITH PROPOFOL N/A 01/05/2021   Procedure: ESOPHAGOGASTRODUODENOSCOPY (EGD) WITH PROPOFOL;  Surgeon: Gatha Mayer, MD;  Location: [place];  Service: Endoscopy;  Laterality: N/A;   INSERTION OF DIALYSIS CATHETER N/A 01/10/2021   Procedure: INSERTION OF TUNNELED DIALYSIS CATHETER RIGHT  INTERNAL JUGULAR;  Surgeon: Cherre Robins, MD;  Location: [place];  Service: Vascular;  Laterality: N/A;   INTRAMEDULLARY (IM) NAIL INTERTROCHANTERIC Left 01/06/2021   Procedure: INTRAMEDULLARY (IM) NAIL INTERTROCHANTRIC;  Surgeon: Erle Crocker, MD;  Location: [place];  Service: Orthopedics;  Laterality: Left;   IR FLUORO GUIDE CV LINE RIGHT  01/01/2021   IR US GUIDE VASC ACCESS RIGHT  01/01/2021   PERIPHERAL VASCULAR BALLOON ANGIOPLASTY Right 08/28/2019   Procedure: PERIPHERAL VASCULAR BALLOON ANGIOPLASTY;  Surgeon: Lorretta Harp, MD;  Location: [place] [facility];  Service: Cardiovascular;  Laterality: Right;  attempted SFA   REMOVAL OF A DIALYSIS CATHETER Right 01/10/2021   Procedure: REMOVAL OF TEMPORARY DIALYSIS CATHETER RIGHT GROIN;  Surgeon: Cherre Robins, MD;  Location: [place];  Service: Vascular;  Laterality: Right;   THROMBECTOMY BRACHIAL ARTERY  01/10/2021   Procedure: THROMBECTOMY LEFT CEPHALIC VEIN;  Surgeon: Cherre Robins, MD;  [facility];  Service: Vascular;;    Current Medications: No outpatient medications have been marked as taking for the 03/14/21 encounter (Appointment) with Deberah Pelton, NP.     Allergies:   Lisinopril and Mercury   Social History   Socioeconomic History   Marital status: Married    Spouse name: Janavia Rottman   Number of children: 2   Years of education: Not on file   Highest education level: Bachelor's degree (e.g., BA, AB, BS)  Occupational History   Occupation: retired  Comment: Med Tech  Tobacco Use   Smoking status: Never   Smokeless tobacco: Never  Vaping Use   Vaping Use: Never used  Substance and Sexual Activity   Alcohol use: No   Drug use: Never   Sexual activity: Not on file  Other Topics Concern   Not on file  Social History Narrative   Not on file   Social Determinants of Health   Financial Resource Strain: Low Risk    Difficulty of Paying Living Expenses: Not very hard  Food Insecurity: No Food  Insecurity   Worried About Running Out of Food in the Last Year: Never true   Ran Out of Food in the Last Year: Never true  Transportation Needs: No Transportation Needs   Lack of Transportation (Medical): No   Lack of Transportation (Non-Medical): No  Physical Activity: Not on file  Stress: Not on file  Social Connections: Not on file     Family History: The patient's ***family history includes Breast cancer in her mother; Hypertension in her father and mother; Pancreatic cancer in an other family member.  ROS:   Please see the history of present illness.    *** All other systems reviewed and are negative.   Risk Assessment/Calculations:   {Does this patient have ATRIAL FIBRILLATION?:[id number]}       Physical Exam:    VS:  There were no vitals taken for this visit.    Wt Readings from Last 3 Encounters:  03/07/21 138 lb 0.1 oz (62.6 kg)  03/04/21 138 lb (62.6 kg)  03/01/21 138 lb (62.6 kg)     GEN: *** Well nourished, well developed in no acute distress HEENT: Normal NECK: No JVD; No carotid bruits LYMPHATICS: No lymphadenopathy CARDIAC: ***RRR, no murmurs, rubs, gallops RESPIRATORY:  Clear to auscultation without rales, wheezing or rhonchi  ABDOMEN: Soft, non-tender, non-distended MUSCULOSKELETAL:  No edema; No deformity  SKIN: Warm and dry NEUROLOGIC:  Alert and oriented x 3 PSYCHIATRIC:  Normal affect    EKGs/Labs/Other Studies Reviewed:    The following studies were reviewed today:  Echocardiogram 12/28/2020  IMPRESSIONS     1. Compared with the echo 09/7670, systolic function is reduced. Left  ventricular ejection fraction, by estimation, is 40 to 45%. The left  ventricle has mildly decreased function. The left ventricle demonstrates  global hypokinesis. Left ventricular  diastolic parameters are indeterminate.   2. Right ventricular systolic function is normal. The right ventricular  size is normal. There is moderately elevated pulmonary artery  systolic  pressure.   3. The pericardial effusion is circumferential.   4. The mitral valve is normal in structure. Mild mitral valve  regurgitation. No evidence of mitral stenosis.   5. The aortic valve is tricuspid. There is mild calcification of the  aortic valve. There is mild thickening of the aortic valve. Aortic valve  regurgitation is not visualized. No aortic stenosis is present.   6. The inferior vena cava is dilated in size with <50% respiratory  variability, suggesting right atrial pressure of 15 mmHg.  EKG:  EKG is *** ordered today.  The ekg ordered today demonstrates ***  Recent Labs: 11/29/2020: TSH 2.532 12/27/2020: B Natriuretic Peptide 1,102.1 01/05/2021: Magnesium 1.9 03/09/2021: ALT 17; BUN 13; Creatinine, Ser 2.84; Hemoglobin 12.6; Platelets 230; Potassium 3.7; Sodium 136  Recent Lipid Panel    Component Value Date/Time   CHOL 190 08/31/2020 0930   CHOL 116 12/10/2019 1136   TRIG 61 08/31/2020 0930   HDL 71 08/31/2020 0930  HDL 52 12/10/2019 1136   CHOLHDL 2.7 08/31/2020 0930   VLDL 12 08/31/2020 0930   LDLCALC 107 (H) 08/31/2020 0930   LDLCALC 46 12/10/2019 1136    ASSESSMENT & PLAN    Essential hypertension-BP today***.  Continues to be elevated at home.  In the 170s-160s over 28s. Continue carvedilol 25 mg twice daily hydralazine 25 mg 3 times daily, Imdur 60 mg daily, not a candidate for ACE ARB Arni due to ESRD Heart healthy low-sodium diet-salty 6 given Increase physical activity as tolerated Repeat echocardiogram in 3 months  Chronic systolic CHF-no increased DOE or activity intolerance.  Echocardiogram showed 40-55% LVEF.  Fluid volume controlled by hemodialysis. Continue carvedilol, hydralazine, Imdur Repeat echocardiogram in 3 months  Paroxysmal atrial fibrillation-heart rate today***.  Well-controlled at home. Continue apixaban, carvedilol Heart healthy low-sodium diet-salty 6 given Increase physical activity as tolerated  Peripheral  vascular disease-plan for surgery left femoral anterior tibial bypass 03/14/2021.  Previously given clearance by Dr. Audie Box on 03/09/2021. Her RCRI is class III risk, 6.6% risk of major cardiac event.  Proceed with surgery  Disposition: Follow-up with Dr. Audie Box after her echocardiogram in 3 months.   {Are you ordering a CV Procedure (e.g. stress test, cath, DCCV, TEE, etc)?   Press F2        :[id number]}    Medication Adjustments/Labs and Tests Ordered: Current medicines are reviewed at length with the patient today.  Concerns regarding medicines are outlined above.  No orders of the defined types were placed in this encounter.  No orders of the defined types were placed in this encounter.   There are no Patient Instructions on file for this visit.   Signed, Deberah Pelton, NP  03/13/2021 1:38 PM      Notice: This dictation was prepared with Dragon dictation along with smaller phrase technology. Any transcriptional errors that result from this process are unintentional and may not be corrected upon review.  I spent***minutes examining this patient, reviewing medications, and using patient centered shared decision making involving her cardiac care.  Prior to her visit I spent greater than 20 minutes reviewing her past medical history,  medications, and prior cardiac tests.

## 2021-03-14 ENCOUNTER — Encounter (HOSPITAL_COMMUNITY): Payer: Self-pay | Admitting: Vascular Surgery

## 2021-03-14 ENCOUNTER — Other Ambulatory Visit: Payer: Self-pay

## 2021-03-14 ENCOUNTER — Inpatient Hospital Stay (HOSPITAL_COMMUNITY): Payer: Medicare Other | Admitting: Physician Assistant

## 2021-03-14 ENCOUNTER — Inpatient Hospital Stay (HOSPITAL_COMMUNITY): Payer: Medicare Other | Admitting: Vascular Surgery

## 2021-03-14 ENCOUNTER — Ambulatory Visit (HOSPITAL_BASED_OUTPATIENT_CLINIC_OR_DEPARTMENT_OTHER): Payer: Medicare Other | Admitting: General Practice

## 2021-03-14 ENCOUNTER — Inpatient Hospital Stay (HOSPITAL_COMMUNITY)
Admission: RE | Admit: 2021-03-14 | Discharge: 2021-03-18 | DRG: 252 | Disposition: A | Discharge status: Home Health | Payer: Medicare Other | Attending: Vascular Surgery | Admitting: Vascular Surgery

## 2021-03-14 ENCOUNTER — Encounter (HOSPITAL_COMMUNITY)
Admission: RE | Disposition: A | Discharge status: Home / Self Care | Payer: Self-pay | Source: Home / Self Care | Attending: Vascular Surgery

## 2021-03-14 DIAGNOSIS — I132 Hypertensive heart and chronic kidney disease with heart failure and with stage 5 chronic kidney disease, or end stage renal disease: Secondary | ICD-10-CM | POA: Diagnosis present

## 2021-03-14 DIAGNOSIS — D631 Anemia in chronic kidney disease: Secondary | ICD-10-CM | POA: Diagnosis present

## 2021-03-14 DIAGNOSIS — E11621 Type 2 diabetes mellitus with foot ulcer: Secondary | ICD-10-CM | POA: Diagnosis present

## 2021-03-14 DIAGNOSIS — I2510 Atherosclerotic heart disease of native coronary artery without angina pectoris: Secondary | ICD-10-CM | POA: Diagnosis present

## 2021-03-14 DIAGNOSIS — F410 Panic disorder [episodic paroxysmal anxiety] without agoraphobia: Secondary | ICD-10-CM | POA: Diagnosis present

## 2021-03-14 DIAGNOSIS — I5042 Chronic combined systolic (congestive) and diastolic (congestive) heart failure: Secondary | ICD-10-CM | POA: Diagnosis not present

## 2021-03-14 DIAGNOSIS — E1151 Type 2 diabetes mellitus with diabetic peripheral angiopathy without gangrene: Principal | ICD-10-CM | POA: Diagnosis present

## 2021-03-14 DIAGNOSIS — G8918 Other acute postprocedural pain: Secondary | ICD-10-CM | POA: Diagnosis not present

## 2021-03-14 DIAGNOSIS — Z9109 Other allergy status, other than to drugs and biological substances: Secondary | ICD-10-CM | POA: Diagnosis not present

## 2021-03-14 DIAGNOSIS — I70212 Atherosclerosis of native arteries of extremities with intermittent claudication, left leg: Secondary | ICD-10-CM

## 2021-03-14 DIAGNOSIS — Z7901 Long term (current) use of anticoagulants: Secondary | ICD-10-CM

## 2021-03-14 DIAGNOSIS — I998 Other disorder of circulatory system: Secondary | ICD-10-CM

## 2021-03-14 DIAGNOSIS — E785 Hyperlipidemia, unspecified: Secondary | ICD-10-CM | POA: Diagnosis present

## 2021-03-14 DIAGNOSIS — Z794 Long term (current) use of insulin: Secondary | ICD-10-CM

## 2021-03-14 DIAGNOSIS — Z803 Family history of malignant neoplasm of breast: Secondary | ICD-10-CM

## 2021-03-14 DIAGNOSIS — I70202 Unspecified atherosclerosis of native arteries of extremities, left leg: Secondary | ICD-10-CM | POA: Diagnosis present

## 2021-03-14 DIAGNOSIS — Z8249 Family history of ischemic heart disease and other diseases of the circulatory system: Secondary | ICD-10-CM

## 2021-03-14 DIAGNOSIS — L97529 Non-pressure chronic ulcer of other part of left foot with unspecified severity: Secondary | ICD-10-CM | POA: Diagnosis present

## 2021-03-14 DIAGNOSIS — Z992 Dependence on renal dialysis: Secondary | ICD-10-CM | POA: Diagnosis not present

## 2021-03-14 DIAGNOSIS — N186 End stage renal disease: Secondary | ICD-10-CM | POA: Diagnosis present

## 2021-03-14 DIAGNOSIS — I480 Paroxysmal atrial fibrillation: Secondary | ICD-10-CM | POA: Diagnosis present

## 2021-03-14 DIAGNOSIS — I739 Peripheral vascular disease, unspecified: Secondary | ICD-10-CM

## 2021-03-14 DIAGNOSIS — K219 Gastro-esophageal reflux disease without esophagitis: Secondary | ICD-10-CM | POA: Diagnosis present

## 2021-03-14 DIAGNOSIS — Z20822 Contact with and (suspected) exposure to covid-19: Secondary | ICD-10-CM | POA: Diagnosis not present

## 2021-03-14 DIAGNOSIS — I959 Hypotension, unspecified: Secondary | ICD-10-CM | POA: Diagnosis not present

## 2021-03-14 DIAGNOSIS — Z79899 Other long term (current) drug therapy: Secondary | ICD-10-CM

## 2021-03-14 DIAGNOSIS — Z800 Family history of malignant neoplasm of digestive organs: Secondary | ICD-10-CM

## 2021-03-14 DIAGNOSIS — E1122 Type 2 diabetes mellitus with diabetic chronic kidney disease: Secondary | ICD-10-CM | POA: Diagnosis not present

## 2021-03-14 DIAGNOSIS — L97422 Non-pressure chronic ulcer of left heel and midfoot with fat layer exposed: Secondary | ICD-10-CM | POA: Diagnosis not present

## 2021-03-14 DIAGNOSIS — L89623 Pressure ulcer of left heel, stage 3: Secondary | ICD-10-CM

## 2021-03-14 DIAGNOSIS — Z888 Allergy status to other drugs, medicaments and biological substances status: Secondary | ICD-10-CM | POA: Diagnosis not present

## 2021-03-14 DIAGNOSIS — N2581 Secondary hyperparathyroidism of renal origin: Secondary | ICD-10-CM | POA: Diagnosis present

## 2021-03-14 HISTORY — PX: FEMORAL-TIBIAL BYPASS GRAFT: SHX938

## 2021-03-14 HISTORY — PX: WOUND DEBRIDEMENT: SHX247

## 2021-03-14 LAB — POCT I-STAT, CHEM 8
BUN: 19 mg/dL (ref 8–23)
Calcium, Ion: 0.98 mmol/L — ABNORMAL LOW (ref 1.15–1.40)
Chloride: 98 mmol/L (ref 98–111)
Creatinine, Ser: 2.3 mg/dL — ABNORMAL HIGH (ref 0.44–1.00)
Glucose, Bld: 59 mg/dL — ABNORMAL LOW (ref 70–99)
HCT: 43 % (ref 36.0–46.0)
Hemoglobin: 14.6 g/dL (ref 12.0–15.0)
Potassium: 3.9 mmol/L (ref 3.5–5.1)
Sodium: 137 mmol/L (ref 135–145)
TCO2: 28 mmol/L (ref 22–32)

## 2021-03-14 LAB — CBC
HCT: 29 % — ABNORMAL LOW (ref 36.0–46.0)
Hemoglobin: 8.8 g/dL — ABNORMAL LOW (ref 12.0–15.0)
MCH: 27.6 pg (ref 26.0–34.0)
MCHC: 30.3 g/dL (ref 30.0–36.0)
MCV: 90.9 fL (ref 80.0–100.0)
Platelets: 233 10*3/uL (ref 150–400)
RBC: 3.19 MIL/uL — ABNORMAL LOW (ref 3.87–5.11)
RDW: 16.7 % — ABNORMAL HIGH (ref 11.5–15.5)
WBC: 5.8 10*3/uL (ref 4.0–10.5)
nRBC: 0 % (ref 0.0–0.2)

## 2021-03-14 LAB — GLUCOSE, CAPILLARY
Glucose-Capillary: 43 mg/dL — CL (ref 70–99)
Glucose-Capillary: 110 mg/dL — ABNORMAL HIGH (ref 70–99)
Glucose-Capillary: 128 mg/dL — ABNORMAL HIGH (ref 70–99)
Glucose-Capillary: 126 mg/dL — ABNORMAL HIGH (ref 70–99)
Glucose-Capillary: 142 mg/dL — ABNORMAL HIGH (ref 70–99)

## 2021-03-14 LAB — TYPE AND SCREEN
ABO/RH(D): B POS
Antibody Screen: NEGATIVE

## 2021-03-14 LAB — POCT ACTIVATED CLOTTING TIME
Activated Clotting Time: 341 seconds
Activated Clotting Time: 144 seconds

## 2021-03-14 LAB — CREATININE, SERUM
Creatinine, Ser: 1.43 mg/dL — ABNORMAL HIGH (ref 0.44–1.00)
GFR, Estimated: 40 mL/min — ABNORMAL LOW (ref >60)

## 2021-03-14 SURGERY — CREATION, BYPASS, ARTERIAL, FEMORAL TO TIBIAL, USING GRAFT
Anesthesia: General | Site: Leg Upper | Laterality: Left

## 2021-03-14 MED ORDER — SUGAMMADEX SODIUM 200 MG/2ML IV SOLN
INTRAVENOUS | Status: DC | PRN
  Administered 2021-03-14: 200 mg via INTRAVENOUS

## 2021-03-14 MED ORDER — MIDAZOLAM HCL 2 MG/2ML IJ SOLN
INTRAMUSCULAR | Status: AC
  Filled 2021-03-14: qty 2

## 2021-03-14 MED ORDER — ROSUVASTATIN CALCIUM 5 MG PO TABS
10.0000 mg | ORAL_TABLET | Freq: Every evening | ORAL | Status: DC
  Administered 2021-03-14 – 2021-03-17 (×4): 10 mg via ORAL
  Filled 2021-03-14 (×5): qty 2

## 2021-03-14 MED ORDER — SODIUM CHLORIDE 0.9 % IV SOLN: INTRAVENOUS | Status: DC

## 2021-03-14 MED ORDER — EPHEDRINE 5 MG/ML INJ
INTRAVENOUS | Status: AC
  Filled 2021-03-14: qty 5

## 2021-03-14 MED ORDER — FLUOXETINE HCL 10 MG PO CAPS
10.0000 mg | ORAL_CAPSULE | Freq: Every day | ORAL | Status: DC
  Administered 2021-03-15 – 2021-03-18 (×4): 10 mg via ORAL
  Filled 2021-03-14 (×4): qty 1

## 2021-03-14 MED ORDER — FENTANYL CITRATE (PF) 100 MCG/2ML IJ SOLN
INTRAMUSCULAR | Status: AC
  Filled 2021-03-14: qty 2

## 2021-03-14 MED ORDER — DEXAMETHASONE SODIUM PHOSPHATE 10 MG/ML IJ SOLN
INTRAMUSCULAR | Status: DC | PRN
  Administered 2021-03-14: 10 mg via INTRAVENOUS

## 2021-03-14 MED ORDER — ACETAMINOPHEN 325 MG PO TABS
325.0000 mg | ORAL_TABLET | ORAL | Status: DC | PRN
  Administered 2021-03-15 – 2021-03-17 (×5): 650 mg via ORAL
  Filled 2021-03-14 (×5): qty 2

## 2021-03-14 MED ORDER — MAGNESIUM SULFATE 2 GM/50ML IV SOLN: 2.0000 g | Freq: Every day | INTRAVENOUS | Status: DC | PRN

## 2021-03-14 MED ORDER — MIDAZOLAM HCL 2 MG/2ML IJ SOLN
INTRAMUSCULAR | Status: DC | PRN
  Administered 2021-03-14 (×2): 1 mg via INTRAVENOUS

## 2021-03-14 MED ORDER — LIDOCAINE 2% (20 MG/ML) 5 ML SYRINGE
INTRAMUSCULAR | Status: DC | PRN
  Administered 2021-03-14: 60 mg via INTRAVENOUS

## 2021-03-14 MED ORDER — PROTAMINE SULFATE 10 MG/ML IV SOLN
INTRAVENOUS | Status: DC | PRN
  Administered 2021-03-14: 50 mg via INTRAVENOUS

## 2021-03-14 MED ORDER — OXYCODONE-ACETAMINOPHEN 5-325 MG PO TABS
1.0000 | ORAL_TABLET | ORAL | Status: DC | PRN
  Administered 2021-03-14: 2 via ORAL
  Administered 2021-03-14 – 2021-03-18 (×6): 1 via ORAL
  Filled 2021-03-14 (×3): qty 1
  Filled 2021-03-14: qty 2
  Filled 2021-03-14 (×2): qty 1
  Filled 2021-03-14: qty 2

## 2021-03-14 MED ORDER — CHLORHEXIDINE GLUCONATE CLOTH 2 % EX PADS: 6.0000 | MEDICATED_PAD | Freq: Once | CUTANEOUS | Status: DC

## 2021-03-14 MED ORDER — SUCCINYLCHOLINE CHLORIDE 200 MG/10ML IV SOSY
PREFILLED_SYRINGE | INTRAVENOUS | Status: DC | PRN
  Administered 2021-03-14: 80 mg via INTRAVENOUS

## 2021-03-14 MED ORDER — ASPIRIN EC 81 MG PO TBEC
81.0000 mg | DELAYED_RELEASE_TABLET | Freq: Every day | ORAL | Status: DC
  Administered 2021-03-15 – 2021-03-18 (×4): 81 mg via ORAL
  Filled 2021-03-14 (×4): qty 1

## 2021-03-14 MED ORDER — ACETAMINOPHEN 650 MG RE SUPP: 325.0000 mg | RECTAL | Status: DC | PRN

## 2021-03-14 MED ORDER — FENTANYL CITRATE (PF) 100 MCG/2ML IJ SOLN
25.0000 ug | INTRAMUSCULAR | Status: DC | PRN
  Administered 2021-03-14 (×2): 25 ug via INTRAVENOUS

## 2021-03-14 MED ORDER — LACTATED RINGERS IV SOLN: INTRAVENOUS | Status: DC

## 2021-03-14 MED ORDER — HEPARIN 6000 UNIT IRRIGATION SOLUTION
Status: AC
  Filled 2021-03-14: qty 500

## 2021-03-14 MED ORDER — DOCUSATE SODIUM 100 MG PO CAPS
100.0000 mg | ORAL_CAPSULE | Freq: Every day | ORAL | Status: DC
  Administered 2021-03-15 – 2021-03-16 (×2): 100 mg via ORAL
  Filled 2021-03-14 (×3): qty 1

## 2021-03-14 MED ORDER — ROCURONIUM BROMIDE 10 MG/ML (PF) SYRINGE
PREFILLED_SYRINGE | INTRAVENOUS | Status: DC | PRN
  Administered 2021-03-14: 20 mg via INTRAVENOUS
  Administered 2021-03-14: 40 mg via INTRAVENOUS

## 2021-03-14 MED ORDER — EPHEDRINE SULFATE-NACL 50-0.9 MG/10ML-% IV SOSY
PREFILLED_SYRINGE | INTRAVENOUS | Status: DC | PRN
  Administered 2021-03-14: 10 mg via INTRAVENOUS

## 2021-03-14 MED ORDER — HEPARIN SODIUM (PORCINE) 1000 UNIT/ML IJ SOLN
INTRAMUSCULAR | Status: DC | PRN
  Administered 2021-03-14: 7000 [IU] via INTRAVENOUS

## 2021-03-14 MED ORDER — SODIUM CHLORIDE 0.9 % IV SOLN: INTRAVENOUS | Status: DC | PRN

## 2021-03-14 MED ORDER — ORAL CARE MOUTH RINSE: 15.0000 mL | Freq: Once | OROMUCOSAL | Status: AC

## 2021-03-14 MED ORDER — METOPROLOL TARTRATE 5 MG/5ML IV SOLN: 2.0000 mg | INTRAVENOUS | Status: DC | PRN

## 2021-03-14 MED ORDER — DRONABINOL 2.5 MG PO CAPS
2.5000 mg | ORAL_CAPSULE | Freq: Every day | ORAL | Status: DC
  Administered 2021-03-15 – 2021-03-17 (×3): 2.5 mg via ORAL
  Filled 2021-03-14 (×3): qty 1

## 2021-03-14 MED ORDER — LEVOCETIRIZINE DIHYDROCHLORIDE 5 MG PO TABS: 5.0000 mg | ORAL_TABLET | Freq: Every day | ORAL | Status: DC | PRN

## 2021-03-14 MED ORDER — FENTANYL CITRATE (PF) 250 MCG/5ML IJ SOLN
INTRAMUSCULAR | Status: AC
  Filled 2021-03-14: qty 5

## 2021-03-14 MED ORDER — CEFAZOLIN SODIUM-DEXTROSE 2-4 GM/100ML-% IV SOLN
2.0000 g | Freq: Three times a day (TID) | INTRAVENOUS | Status: AC
  Administered 2021-03-14 (×2): 2 g via INTRAVENOUS
  Filled 2021-03-14 (×3): qty 100

## 2021-03-14 MED ORDER — HEMOSTATIC AGENTS (NO CHARGE) OPTIME
TOPICAL | Status: DC | PRN
  Administered 2021-03-14: 2 via TOPICAL

## 2021-03-14 MED ORDER — DEXAMETHASONE SODIUM PHOSPHATE 10 MG/ML IJ SOLN
INTRAMUSCULAR | Status: AC
  Filled 2021-03-14: qty 1

## 2021-03-14 MED ORDER — APIXABAN 5 MG PO TABS
5.0000 mg | ORAL_TABLET | Freq: Two times a day (BID) | ORAL | Status: DC
  Administered 2021-03-15 – 2021-03-18 (×7): 5 mg via ORAL
  Filled 2021-03-14 (×7): qty 1

## 2021-03-14 MED ORDER — HYDRALAZINE HCL 25 MG PO TABS
25.0000 mg | ORAL_TABLET | Freq: Three times a day (TID) | ORAL | Status: DC
  Administered 2021-03-14 – 2021-03-18 (×9): 25 mg via ORAL
  Filled 2021-03-14 (×10): qty 1

## 2021-03-14 MED ORDER — CARVEDILOL 25 MG PO TABS
25.0000 mg | ORAL_TABLET | Freq: Two times a day (BID) | ORAL | Status: DC
  Administered 2021-03-14 – 2021-03-18 (×6): 25 mg via ORAL
  Filled 2021-03-14 (×7): qty 1

## 2021-03-14 MED ORDER — CHLORHEXIDINE GLUCONATE 0.12 % MT SOLN
15.0000 mL | Freq: Once | OROMUCOSAL | Status: AC
  Administered 2021-03-14: 07:00:00 15 mL via OROMUCOSAL
  Filled 2021-03-14: qty 15

## 2021-03-14 MED ORDER — LIDOCAINE 2% (20 MG/ML) 5 ML SYRINGE
INTRAMUSCULAR | Status: AC
  Filled 2021-03-14: qty 5

## 2021-03-14 MED ORDER — ONDANSETRON HCL 4 MG/2ML IJ SOLN
INTRAMUSCULAR | Status: AC
  Filled 2021-03-14: qty 2

## 2021-03-14 MED ORDER — HYDRALAZINE HCL 20 MG/ML IJ SOLN
5.0000 mg | INTRAMUSCULAR | Status: DC | PRN
  Filled 2021-03-14: qty 1

## 2021-03-14 MED ORDER — GUAIFENESIN-DM 100-10 MG/5ML PO SYRP: 15.0000 mL | ORAL_SOLUTION | ORAL | Status: DC | PRN

## 2021-03-14 MED ORDER — ONDANSETRON HCL 4 MG/2ML IJ SOLN
INTRAMUSCULAR | Status: DC | PRN
  Administered 2021-03-14: 4 mg via INTRAVENOUS

## 2021-03-14 MED ORDER — SUCCINYLCHOLINE CHLORIDE 200 MG/10ML IV SOSY
PREFILLED_SYRINGE | INTRAVENOUS | Status: AC
  Filled 2021-03-14: qty 10

## 2021-03-14 MED ORDER — HYDRALAZINE HCL 20 MG/ML IJ SOLN
INTRAMUSCULAR | Status: DC | PRN
  Administered 2021-03-14 (×2): 5 mg via INTRAVENOUS

## 2021-03-14 MED ORDER — SODIUM CHLORIDE 0.9 % IV SOLN: 500.0000 mL | Freq: Once | INTRAVENOUS | Status: DC | PRN

## 2021-03-14 MED ORDER — PROPOFOL 10 MG/ML IV BOLUS
INTRAVENOUS | Status: DC | PRN
  Administered 2021-03-14: 50 mg via INTRAVENOUS
  Administered 2021-03-14: 20 mg via INTRAVENOUS

## 2021-03-14 MED ORDER — LABETALOL HCL 5 MG/ML IV SOLN: 10.0000 mg | INTRAVENOUS | Status: DC | PRN

## 2021-03-14 MED ORDER — ALUM & MAG HYDROXIDE-SIMETH 200-200-20 MG/5ML PO SUSP: 15.0000 mL | ORAL | Status: DC | PRN

## 2021-03-14 MED ORDER — DEXTROSE 50 % IV SOLN
INTRAVENOUS | Status: AC
  Administered 2021-03-14: 07:00:00 25 g via INTRAVENOUS
  Filled 2021-03-14: qty 50

## 2021-03-14 MED ORDER — HYDRALAZINE HCL 20 MG/ML IJ SOLN
INTRAMUSCULAR | Status: AC
  Filled 2021-03-14: qty 1

## 2021-03-14 MED ORDER — 0.9 % SODIUM CHLORIDE (POUR BTL) OPTIME
TOPICAL | Status: DC | PRN
  Administered 2021-03-14: 09:00:00 1000 mL

## 2021-03-14 MED ORDER — POTASSIUM CHLORIDE CRYS ER 20 MEQ PO TBCR: 20.0000 meq | EXTENDED_RELEASE_TABLET | Freq: Every day | ORAL | Status: DC | PRN

## 2021-03-14 MED ORDER — LORATADINE 10 MG PO TABS: 10.0000 mg | ORAL_TABLET | Freq: Every day | ORAL | Status: DC | PRN

## 2021-03-14 MED ORDER — PROPOFOL 10 MG/ML IV BOLUS
INTRAVENOUS | Status: AC
  Filled 2021-03-14: qty 20

## 2021-03-14 MED ORDER — DEXTROSE 50 % IV SOLN: 25.0000 g | INTRAVENOUS | Status: AC

## 2021-03-14 MED ORDER — ISOSORBIDE MONONITRATE ER 60 MG PO TB24
60.0000 mg | ORAL_TABLET | Freq: Every day | ORAL | Status: DC
  Administered 2021-03-15 – 2021-03-18 (×4): 60 mg via ORAL
  Filled 2021-03-14 (×4): qty 1

## 2021-03-14 MED ORDER — ACETAMINOPHEN 500 MG PO TABS
1000.0000 mg | ORAL_TABLET | Freq: Once | ORAL | Status: DC
  Filled 2021-03-14: qty 2

## 2021-03-14 MED ORDER — PHENYLEPHRINE HCL-NACL 20-0.9 MG/250ML-% IV SOLN
INTRAVENOUS | Status: DC | PRN
Start: 2021-03-14 — End: 2021-03-14
  Administered 2021-03-14: 20 ug/min via INTRAVENOUS

## 2021-03-14 MED ORDER — ROCURONIUM BROMIDE 10 MG/ML (PF) SYRINGE
PREFILLED_SYRINGE | INTRAVENOUS | Status: AC
  Filled 2021-03-14: qty 10

## 2021-03-14 MED ORDER — FAMOTIDINE 20 MG PO TABS
20.0000 mg | ORAL_TABLET | Freq: Two times a day (BID) | ORAL | Status: DC
  Administered 2021-03-14 – 2021-03-15 (×3): 20 mg via ORAL
  Filled 2021-03-14 (×3): qty 1

## 2021-03-14 MED ORDER — SENNOSIDES-DOCUSATE SODIUM 8.6-50 MG PO TABS: 1.0000 | ORAL_TABLET | Freq: Every evening | ORAL | Status: DC | PRN

## 2021-03-14 MED ORDER — EZETIMIBE 10 MG PO TABS
10.0000 mg | ORAL_TABLET | Freq: Every evening | ORAL | Status: DC
  Administered 2021-03-14 – 2021-03-17 (×4): 10 mg via ORAL
  Filled 2021-03-14 (×4): qty 1

## 2021-03-14 MED ORDER — HEPARIN 6000 UNIT IRRIGATION SOLUTION
Status: DC | PRN
  Administered 2021-03-14: 1

## 2021-03-14 MED ORDER — CHLORHEXIDINE GLUCONATE CLOTH 2 % EX PADS
6.0000 | MEDICATED_PAD | Freq: Every day | CUTANEOUS | Status: DC
  Administered 2021-03-14 – 2021-03-18 (×6): 6 via TOPICAL

## 2021-03-14 MED ORDER — CEFAZOLIN SODIUM-DEXTROSE 2-4 GM/100ML-% IV SOLN
2.0000 g | INTRAVENOUS | Status: AC
  Administered 2021-03-14: 08:00:00 2 g via INTRAVENOUS
  Filled 2021-03-14: qty 100

## 2021-03-14 MED ORDER — ACETAMINOPHEN 500 MG PO TABS
500.0000 mg | ORAL_TABLET | Freq: Once | ORAL | Status: AC
  Administered 2021-03-14: 07:00:00 500 mg via ORAL

## 2021-03-14 MED ORDER — HYDRALAZINE HCL 25 MG PO TABS
25.0000 mg | ORAL_TABLET | ORAL | Status: AC
  Administered 2021-03-14: 07:00:00 25 mg via ORAL
  Filled 2021-03-14: qty 1

## 2021-03-14 MED ORDER — PANTOPRAZOLE SODIUM 40 MG PO TBEC
40.0000 mg | DELAYED_RELEASE_TABLET | Freq: Every day | ORAL | Status: DC
  Administered 2021-03-15 – 2021-03-18 (×4): 40 mg via ORAL
  Filled 2021-03-14 (×4): qty 1

## 2021-03-14 MED ORDER — PHENOL 1.4 % MT LIQD
1.0000 | OROMUCOSAL | Status: DC | PRN
  Administered 2021-03-14 (×2): 1 via OROMUCOSAL
  Filled 2021-03-14: qty 177

## 2021-03-14 MED ORDER — HYDROMORPHONE HCL 1 MG/ML IJ SOLN
0.5000 mg | INTRAMUSCULAR | Status: DC | PRN
  Administered 2021-03-14: 16:00:00 1 mg via INTRAVENOUS
  Filled 2021-03-14: qty 1

## 2021-03-14 MED ORDER — DARBEPOETIN ALFA 60 MCG/0.3ML IJ SOSY
60.0000 ug | PREFILLED_SYRINGE | INTRAMUSCULAR | Status: DC
  Filled 2021-03-14 (×2): qty 0.3

## 2021-03-14 MED ORDER — FENTANYL CITRATE (PF) 250 MCG/5ML IJ SOLN
INTRAMUSCULAR | Status: DC | PRN
  Administered 2021-03-14 (×2): 25 ug via INTRAVENOUS

## 2021-03-14 SURGICAL SUPPLY — 67 items
BAG COUNTER SPONGE SURGICOUNT (BAG) ×3 IMPLANT
BAG SURGICOUNT SPONGE COUNTING (BAG) ×1
BANDAGE ESMARK 6X9 LF (GAUZE/BANDAGES/DRESSINGS) ×2 IMPLANT
BENZOIN TINCTURE PRP APPL 2/3 (GAUZE/BANDAGES/DRESSINGS) ×12 IMPLANT
BNDG ELASTIC 4X5.8 VLCR STR LF (GAUZE/BANDAGES/DRESSINGS) ×4 IMPLANT
BNDG ESMARK 6X9 LF (GAUZE/BANDAGES/DRESSINGS) ×4
BNDG GAUZE ELAST 4 BULKY (GAUZE/BANDAGES/DRESSINGS) ×4 IMPLANT
CANISTER SUCT 3000ML PPV (MISCELLANEOUS) ×4 IMPLANT
CANNULA VESSEL 3MM 2 BLNT TIP (CANNULA) IMPLANT
CHLORAPREP W/TINT 26 (MISCELLANEOUS) ×8 IMPLANT
CLIP VESOCCLUDE MED 24/CT (CLIP) ×4 IMPLANT
CLIP VESOCCLUDE SM WIDE 24/CT (CLIP) ×4 IMPLANT
CLOSURE WOUND 1/2 X4 (GAUZE/BANDAGES/DRESSINGS) ×3
COVER PROBE W GEL 5X96 (DRAPES) ×4 IMPLANT
CUFF TOURN DUAL PORT 34 (TOURNIQUET CUFF) ×4 IMPLANT
CUFF TOURN SGL QUICK 24 (TOURNIQUET CUFF)
CUFF TOURN SGL QUICK 34 (TOURNIQUET CUFF)
CUFF TOURN SGL QUICK 42 (TOURNIQUET CUFF) IMPLANT
CUFF TRNQT CYL 24X4X16.5-23 (TOURNIQUET CUFF) IMPLANT
CUFF TRNQT CYL 34X4.125X (TOURNIQUET CUFF) IMPLANT
DERMABOND ADVANCED (GAUZE/BANDAGES/DRESSINGS) ×4
DERMABOND ADVANCED .7 DNX12 (GAUZE/BANDAGES/DRESSINGS) ×4 IMPLANT
DRAIN CHANNEL 15F RND FF W/TCR (WOUND CARE) IMPLANT
DRAPE C-ARM 42X72 X-RAY (DRAPES) IMPLANT
DRAPE HALF SHEET 40X57 (DRAPES) IMPLANT
DRAPE X-RAY CASS 24X20 (DRAPES) IMPLANT
ELECT REM PT RETURN 9FT ADLT (ELECTROSURGICAL) ×4
ELECTRODE REM PT RTRN 9FT ADLT (ELECTROSURGICAL) ×2 IMPLANT
EVACUATOR SILICONE 100CC (DRAIN) IMPLANT
GAUZE SPONGE 4X4 12PLY STRL (GAUZE/BANDAGES/DRESSINGS) ×4 IMPLANT
GLOVE SRG 8 PF TXTR STRL LF DI (GLOVE) ×2 IMPLANT
GLOVE SURG POLYISO LF SZ8 (GLOVE) ×8 IMPLANT
GLOVE SURG UNDER POLY LF SZ7.5 (GLOVE) ×4 IMPLANT
GLOVE SURG UNDER POLY LF SZ8 (GLOVE) ×4
GOWN STRL REUS W/ TWL LRG LVL3 (GOWN DISPOSABLE) ×4 IMPLANT
GOWN STRL REUS W/ TWL XL LVL3 (GOWN DISPOSABLE) ×2 IMPLANT
GOWN STRL REUS W/TWL LRG LVL3 (GOWN DISPOSABLE) ×8
GOWN STRL REUS W/TWL XL LVL3 (GOWN DISPOSABLE) ×4
GRAFT PROPATEN W/RING 6X80X60 (Vascular Products) ×4 IMPLANT
HEMOSTAT SNOW SURGICEL 2X4 (HEMOSTASIS) ×8 IMPLANT
INSERT FOGARTY SM (MISCELLANEOUS) IMPLANT
KIT BASIN OR (CUSTOM PROCEDURE TRAY) ×4 IMPLANT
KIT TURNOVER KIT B (KITS) ×4 IMPLANT
MARKER GRAFT CORONARY BYPASS (MISCELLANEOUS) IMPLANT
NS IRRIG 1000ML POUR BTL (IV SOLUTION) ×8 IMPLANT
PACK PERIPHERAL VASCULAR (CUSTOM PROCEDURE TRAY) ×4 IMPLANT
PAD ARMBOARD 7.5X6 YLW CONV (MISCELLANEOUS) ×8 IMPLANT
PADDING CAST COTTON 6X4 STRL (CAST SUPPLIES) ×4 IMPLANT
SET COLLECT BLD 21X3/4 12 (NEEDLE) IMPLANT
STOPCOCK 4 WAY LG BORE MALE ST (IV SETS) IMPLANT
STRIP CLOSURE SKIN 1/2X4 (GAUZE/BANDAGES/DRESSINGS) ×9 IMPLANT
SUT ETHILON 3 0 PS 1 (SUTURE) IMPLANT
SUT MNCRL AB 4-0 PS2 18 (SUTURE) ×8 IMPLANT
SUT PROLENE 5 0 C 1 24 (SUTURE) ×8 IMPLANT
SUT PROLENE 6 0 BV (SUTURE) ×8 IMPLANT
SUT SILK 2 0 SH (SUTURE) ×4 IMPLANT
SUT SILK 3 0 (SUTURE)
SUT SILK 3-0 18XBRD TIE 12 (SUTURE) IMPLANT
SUT VIC AB 2-0 CT1 27 (SUTURE) ×8
SUT VIC AB 2-0 CT1 TAPERPNT 27 (SUTURE) ×4 IMPLANT
SUT VIC AB 3-0 SH 27 (SUTURE) ×8
SUT VIC AB 3-0 SH 27X BRD (SUTURE) ×4 IMPLANT
TAPE UMBILICAL COTTON 1/8X30 (MISCELLANEOUS) ×4 IMPLANT
TOWEL GREEN STERILE (TOWEL DISPOSABLE) ×4 IMPLANT
TRAY FOLEY MTR SLVR 16FR STAT (SET/KITS/TRAYS/PACK) ×4 IMPLANT
UNDERPAD 30X36 HEAVY ABSORB (UNDERPADS AND DIAPERS) ×4 IMPLANT
WATER STERILE IRR 1000ML POUR (IV SOLUTION) ×4 IMPLANT

## 2021-03-14 NOTE — Progress Notes (Signed)
Mobility Specialist Progress Note    03/14/21 1524  Mobility  Activity Refused mobility   Pt stated the doctor told her to just chill today.  [facility] Mobility Specialist  M.S. Primary Phone: [phone] M.S. Secondary Phone: [phone]

## 2021-03-14 NOTE — Anesthesia Postprocedure Evaluation (Signed)
Anesthesia Post Note  Patient: Molly [name]  Procedure(s) Performed: LEFT common FEMORAL-ANTERIOR TIBIAL ARTERY BYPASS woth Gortex (Left: Leg Upper) LEFT FOOT DEBRIDEMENT (Left: Foot)     Patient location during evaluation: PACU Anesthesia Type: General Level of consciousness: awake and alert Pain management: pain level controlled Vital Signs Assessment: post-procedure vital signs reviewed and stable Respiratory status: spontaneous breathing, nonlabored ventilation, respiratory function stable and patient connected to nasal cannula oxygen Cardiovascular status: blood pressure returned to baseline and stable Postop Assessment: no apparent nausea or vomiting Anesthetic complications: no   No notable events documented.  Last Vitals:  Vitals:   03/14/21 1500 03/14/21 1932  BP: (!) 141/57 (!) 121/53  Pulse: 69 68  Resp: 16 17  Temp: (!) 36.1 C (!) 36.4 C  SpO2: 99% 99%    Last Pain:  Vitals:   03/14/21 1932  TempSrc: Oral  PainSc:                  Catalina Gravel

## 2021-03-14 NOTE — Op Note (Signed)
DATE OF SERVICE: 03/14/2021  PATIENT:  Molly [name]  70 y.o. female  PRE-OPERATIVE DIAGNOSIS: Atherosclerosis of native arteries of left lower extremity causing ulceration  POST-OPERATIVE DIAGNOSIS:  Same  PROCEDURE:   1) left common femoral to anterior tibial artery bypass with 6 mm externally supported ePTFE in a subcutaneous tunnel 2) Sharp excisional debridement of left great toe and heel (total area 3.5 x 5 cm).  SURGEON:  Surgeon(s) and Role:    * Mekhia Brogan, Yevonne Aline, MD - Primary  ASSISTANT: Paulo Fruit, PA-C  An assistant was required to facilitate exposure and expedite the case.  ANESTHESIA:   general  EBL: 84mL  BLOOD ADMINISTERED:none  DRAINS: none   LOCAL MEDICATIONS USED:  NONE  SPECIMEN:  none  COUNTS: confirmed correct.  TOURNIQUET:    Total Tourniquet Time Documented: Thigh (Left) - 12 minutes Total: Thigh (Left) - 12 minutes   PATIENT DISPOSITION:  PACU - hemodynamically stable.   Delay start of Pharmacological VTE agent (>24hrs) due to surgical blood loss or risk of bleeding: no  INDICATION FOR PROCEDURE: Molly [name] is a 70 y.o. female with atherosclerosis of native arteries of left lower extremity causing ulceration.  Preoperative angiogram showed occlusion of the distal superficial femoral artery with a "island" of popliteal artery reconstituting before occluding just proximal to the origin of the anterior tibial artery.  The anterior tibial artery fed the foot. After careful discussion of risks, benefits, and alternatives the patient was offered left common femoral to anterior tibial artery bypass and foot debridement. The patient understood and wished to proceed.  OPERATIVE FINDINGS: Common femoral artery was more heavily diseased than anticipated, but less than 50% stenosis was appreciated.  The anterior tibial artery was exposed and found to be adequate for bypass.  The 2 exposures were connected with a subcutaneous tunnel and a 6 mm externally  supported Gore-Tex graft.  The proximal anastomosis was done under clamp control.  The distal anastomosis was done under tourniquet control.  1 repair stitch was needed at the tibial anastomosis.  Doppler flow was achieved at the anterior tibial artery at the ankle at completion.  Healthy bleeding was noted from foot debridement.  DESCRIPTION OF PROCEDURE: After identification of the patient in the pre-operative holding area, the patient was transferred to the operating room. The patient was positioned supine on the operating room table. Anesthesia was induced. The left leg was prepped and draped in standard fashion. A surgical pause was performed confirming correct patient, procedure, and operative location.  A transverse incision was made in the left groin to expose the common femoral artery.  Incision was carried down through subtenons tissue until the femoral sheath was encountered and the common femoral artery identified and skeletonized.  The common femoral artery was encircled proximally distally with large Silastic Vesseloops.  The wound was packed and attention turned to the calf.  Anterior tibial artery was exposed through standard incision in the anterior calf.  The incision was carried down through subtenons tissue until the fascia of the anterior compartment was identified and carefully divided.  The anterior tibialis and fibularis longus were split.  The anterior tibial vascular bundle was identified and the anterior tibial artery was skeletonized on the anterior surface only.  A subcutaneous tunnel was created with a sheath tunneling device connecting the 2 exposures.  A 6 mm externally supported ePTFE vascular graft was delivered through the subcutaneous tunnel.  The common femoral artery was clamped proximally distally with Henley clamps.  An anterior arteriotomy was made on the soft portion of the common femoral artery with an 11 blade extended with Potts scissors.  A significant amount  of posterior plaque was identified, but left alone to not further complicate this case for this chronically ill woman.  The proximal aspect of the vascular graft was beveled to allow end-to-side anastomosis and then anastomosed using continuous running suture of 5-0 Prolene.  After completion the anastomosis was flushed down the open end of the graft.  Excellent flow was noted through the graft.  The graft was clamped near the proximal anastomosis.  A tourniquet was applied to the left thigh.  The left leg was exsanguinated with an Esmarch tourniquet.  The pneumatic tourniquet was inflated to 300 mmHg.  The time was noted.  A anterior lateral arteriotomy was made with 11 blade and extended with Potts scissors on the anterior tibial artery.  Minimal bleeding was noted from the vessel.  The distal end of the vascular graft was beveled to allow end-to-side anastomosis to the anterior tibial artery.  Anastomosis was performed with continuous running suture of 6-0 Prolene.  The tourniquet was released.  The clamp on the graft was released.  There was some minor needle hole bleeding from the proximal anastomosis which resolved with observation.  The distal anastomosis required 1 repair stitch to treat a small area of leak.  After doing this the anastomoses were hemostatic.  The distal anastomosis was packed with Surgicel.  The foot was evaluated.  Brisk capillary refill was noted.  Brisk Doppler flow was noted at the anterior tibial artery at the ankle.  Satisfied we ended the revascularization here.  Protamine was administered.  Hemostasis was again confirmed in the anastomoses in the surgical beds.  The wounds were closed in layers using 2-0 Vicryl, 3-0 Vicryl, 4-0 Monocryl.  Dermabond was applied to the incisions.  Attention was turned to the foot.  Sharp excisional debridement was performed of a left great toe ulcer and left decubitus heel ulcer.  Thankfully both only involve the fat layer of the tissue.   There was some minor purulence in the toe ulcer.  The wounds were debrided down to healthy bleeding tissue.  I good amount of bleeding was noted.  The wounds were dressed wet-to-dry.  Upon completion of the case instrument and sharps counts were confirmed correct. The patient was transferred to the PACU in good condition. I was present for all portions of the procedure.  Yevonne Aline. Stanford Breed, MD [facility] Phone Number: [phone] 03/14/2021 10:02 AM

## 2021-03-14 NOTE — Anesthesia Procedure Notes (Signed)
Procedure Name: Intubation Date/Time: 03/14/2021 7:58 AM Performed by: Reece Agar, CRNA Pre-anesthesia Checklist: Patient identified, Emergency Drugs available, Suction available and Patient being monitored Patient Re-evaluated:Patient Re-evaluated prior to induction Oxygen Delivery Method: Circle System Utilized Preoxygenation: Pre-oxygenation with 100% oxygen Induction Type: IV induction Ventilation: Mask ventilation without difficulty Laryngoscope Size: Glidescope and 3 Grade View: Grade I Tube type: Oral Tube size: 7.0 mm Number of attempts: 3 Airway Equipment and Method: Rigid stylet, Video-laryngoscopy and Bougie stylet Placement Confirmation: ETT inserted through vocal cords under direct vision, positive ETCO2 and breath sounds checked- equal and bilateral Secured at: 21 cm Tube secured with: Tape Dental Injury: Teeth and Oropharynx as per pre-operative assessment  Difficulty Due To: Difficulty was anticipated, Difficult Airway- due to reduced neck mobility, Difficult Airway- due to anterior larynx and Difficult Airway- due to limited oral opening Comments: Pt with difficult airway. 1st intubation with glidescope and ETT with glidescope stylet was not anterior enough to reach vocal cords. Bougie with extreme curvature was able to reach larynx. Glottic opening cephlad and still had difficulty getting ETT over bougie through past tongue to larynx.

## 2021-03-14 NOTE — Discharge Instructions (Signed)
 [facility]  Discharge instructions  Lower Extremity Bypass Surgery  Please refer to the following instruction for your post-procedure care. Your surgeon or physician assistant will discuss any changes with you.  Activity  You are encouraged to walk as much as you can. You can slowly return to normal activities during the month after your surgery. Avoid strenuous activity and heavy lifting until your doctor tells you it's OK. Avoid activities such as vacuuming or swinging a golf club. Do not drive until your doctor give the OK and you are no longer taking prescription pain medications. It is also normal to have difficulty with sleep habits, eating and bowel movement after surgery. These will go away with time.  Bathing/Showering  Shower daily after you go home. Do not soak in a bathtub, hot tub, or swim until the incision heals completely.  Incision Care  Clean your incision with mild soap and water. Shower every day. Pat the area dry with a clean towel. You do not need a bandage unless otherwise instructed. Do not apply any ointments or creams to your incision. If you have open wounds you will be instructed how to care for them or a visiting nurse may be arranged for you. If you have staples or sutures along your incision they will be removed at your post-op appointment. You may have skin glue on your incision. Do not peel it off. It will come off on its own in about one week.  Wash the groin wound with soap and water daily and pat dry. (No tub bath-only shower)  Then put a dry gauze or washcloth in the groin to keep this area dry to help prevent wound infection.  Do this daily and as needed.  Do not use Vaseline or neosporin on your incisions.  Only use soap and water on your incisions and then protect and keep dry.  Diet  Resume your normal diet. There are no special food restrictions following this procedure. A low fat/ low cholesterol diet is  recommended for all patients with vascular disease. In order to heal from your surgery, it is CRITICAL to get adequate nutrition. Your body requires vitamins, minerals, and protein. Vegetables are the best source of vitamins and minerals. Vegetables also provide the perfect balance of protein. Processed food has little nutritional value, so try to avoid this.  Medications  Resume taking all your medications unless your doctor or physician assistant tells you not to. If your incision is causing pain, you may take over-the-counter pain relievers such as acetaminophen (Tylenol). If you were prescribed a stronger pain medication, please aware these medication can cause nausea and constipation. Prevent nausea by taking the medication with a snack or meal. Avoid constipation by drinking plenty of fluids and eating foods with high amount of fiber, such as fruits, vegetables, and grains. Take Colace 100 mg (an over-the-counter stool softener) twice a day as needed for constipation.  Do not take Tylenol if you are taking prescription pain medications.  Follow Up  Our office will schedule a follow up appointment 2-3 weeks following discharge.  Please call us immediately for any of the following conditions  Severe or worsening pain in your legs or feet while at rest or while walking Increase pain, redness, warmth, or drainage (pus) from your incision site(s) Fever of 101 degree or higher The swelling in your leg with the bypass suddenly worsens and becomes more painful than when you were in the hospital If you have   been instructed to feel your graft pulse then you should do so every day. If you can no longer feel this pulse, call the office immediately. Not all patients are given this instruction.  Leg swelling is common after leg bypass surgery.  The swelling should improve over a few months following surgery. To improve the swelling, you may elevate your legs above the level of your heart while you are  sitting or resting. Your surgeon or physician assistant may ask you to apply an ACE wrap or wear compression (TED) stockings to help to reduce swelling.  Reduce your risk of vascular disease  Stop smoking. If you would like help call [facility] at 1-800-QUIT-NOW (1-800-784-8669) or [place] at 336-586-4000.  Manage your cholesterol Maintain a desired weight Control your diabetes weight Control your diabetes Keep your blood pressure down  If you have any questions, please call the office at [phone]  

## 2021-03-14 NOTE — Consult Note (Signed)
[facility] Renal Consultation Note    Indication for Consultation:  Management of ESRD/hemodialysis; anemia, hypertension/volume and secondary hyperparathyroidism  RUE:[id number], Rupashree, MD  HPI: Molly [name] is a 70 y.o. female with ESRD on HD TTS at [facility], first starting in the hospital on 01/01/21.  Past medical history significant for HTN, A fib, diastolic dysfunction, renal artery stenosis s/p stent placement, obstructive uropathy, DMT2, PAD and chronic L great toe ulcer.   Patient admitted to the hospital for planned LLE revascularization and debridement of L great toe ulcer.  Seen and examined at bedside post procedure with husband present.  Reports pain in L foot but otherwise doing ok.  States ulcer on toe developed during hospitalization in September and has continued to worsen.  Denies CP, SOB, abdominal pain and n/v/d.  Dialysis has been going well.  Last treatment yesterday due to Thanksgiving holiday schedule.  Patient had been admitted for further evaluation and management.  Past Medical History:  Diagnosis Date   Abdominal aortic atherosclerosis with stenosis 06/29/2015   Abnormal LFTs    Anemia    Atopic dermatitis 04/21/2020   Bilateral impacted cerumen 08/21/2019   Cholecystitis 06/29/2015   Cholelithiasis 06/29/2015   Chronic combined systolic (congestive) and diastolic (congestive) heart failure (HCC)    Claudication in peripheral vascular disease ([place]) 08/28/2019   Peripheral arterial disease   Colon cancer screening 04/21/2020   Dehydration with hyponatremia 06/29/2015   Diabetes mellitus ([place]) 07/15/2018   DKA (diabetic ketoacidoses) 06/29/2015   Dyslipidemia 08/30/2018   Elevated troponin    End stage renal disease (HCC)    Epigastric pain 04/21/2020   Gastroesophageal reflux disease 04/21/2020   HTN (hypertension) 06/29/2015   Hyperglycemia due to type 2 diabetes mellitus ([place]) 04/21/2020   Hypertensive emergency 07/12/2020    Hypertensive heart disease without congestive heart failure 04/21/2020   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   Irritable bowel syndrome with diarrhea 10/02/2018   Leukocytosis 06/29/2015   Long term (current) use of insulin ([place]) 04/21/2020   Loss of appetite 04/21/2020   Nausea 04/21/2020   Occlusion and stenosis of bilateral carotid arteries 04/21/2020   Osteopenia 10/02/2018   Other dysphagia 09/10/2018   Peripheral vascular disease ([place]) 07/15/2018   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   Proptosis 04/21/2020   Proximal leg weakness 09/10/2018   Ptosis of left eyelid 09/10/2018   Renal artery stenosis ([place]) 12/17/2019   Renal artery stenosis   Standard chest x-ray abnormal 04/21/2020   Vitamin D deficiency 04/21/2020   Weight loss 04/21/2020   Past Surgical History:  Procedure Laterality Date   ABDOMINAL AORTOGRAM W/LOWER EXTREMITY N/A 08/28/2019   Procedure: ABDOMINAL AORTOGRAM W/LOWER EXTREMITY;  Surgeon: Lorretta Harp, MD;  Location: [place] [facility];  Service: Cardiovascular;  Laterality: N/A;   ABDOMINAL AORTOGRAM W/LOWER EXTREMITY Bilateral 03/07/2021   Procedure: ABDOMINAL AORTOGRAM W/LOWER EXTREMITY;  Surgeon: Waynetta Sandy, MD;  Location: [place] [facility];  Service: Cardiovascular;  Laterality: Bilateral;   AV FISTULA PLACEMENT Left 01/10/2021   Procedure: LEFT ARM BRACHIOCEPHALIC  ARTERIOVENOUS (AV) FISTULA CREATION;  Surgeon: Cherre Robins, MD;  Location: [place];  Service: Vascular;  Laterality: Left;   BIOPSY  01/05/2021   Procedure: BIOPSY;  Surgeon: Gatha Mayer, MD;  Location: [facility];  Service: Endoscopy;;   CATARACT EXTRACTION, BILATERAL     CHOLECYSTECTOMY N/A 06/30/2015   Procedure: LAPAROSCOPIC CHOLECYSTECTOMY WITH INTRAOPERATIVE CHOLANGIOGRAM;  Surgeon: Donnie Mesa,  MD;  Location: [place];  Service: General;  Laterality: N/A;   CYSTOSCOPY W/ URETERAL STENT  PLACEMENT Left 12/30/2020   Procedure: CYSTOSCOPY WITH RETROGRADE PYELOGRAM/URETERAL STENT PLACEMENT;  Surgeon: Janith Lima, MD;  Location: [place];  Service: Urology;  Laterality: Left;   ESOPHAGOGASTRODUODENOSCOPY (EGD) WITH PROPOFOL N/A 01/05/2021   Procedure: ESOPHAGOGASTRODUODENOSCOPY (EGD) WITH PROPOFOL;  Surgeon: Gatha Mayer, MD;  Location: [place];  Service: Endoscopy;  Laterality: N/A;   INSERTION OF DIALYSIS CATHETER N/A 01/10/2021   Procedure: INSERTION OF TUNNELED DIALYSIS CATHETER RIGHT INTERNAL JUGULAR;  Surgeon: Cherre Robins, MD;  Location: [place];  Service: Vascular;  Laterality: N/A;   INTRAMEDULLARY (IM) NAIL INTERTROCHANTERIC Left 01/06/2021   Procedure: INTRAMEDULLARY (IM) NAIL INTERTROCHANTRIC;  Surgeon: Erle Crocker, MD;  Location: [place];  Service: Orthopedics;  Laterality: Left;   IR FLUORO GUIDE CV LINE RIGHT  01/01/2021   IR US GUIDE VASC ACCESS RIGHT  01/01/2021   PERIPHERAL VASCULAR BALLOON ANGIOPLASTY Right 08/28/2019   Procedure: PERIPHERAL VASCULAR BALLOON ANGIOPLASTY;  Surgeon: Lorretta Harp, MD;  Location: [place] [facility];  Service: Cardiovascular;  Laterality: Right;  attempted SFA   REMOVAL OF A DIALYSIS CATHETER Right 01/10/2021   Procedure: REMOVAL OF TEMPORARY DIALYSIS CATHETER RIGHT GROIN;  Surgeon: Cherre Robins, MD;  Location: [facility] OR;  Service: Vascular;  Laterality: Right;   THROMBECTOMY BRACHIAL ARTERY  01/10/2021   Procedure: THROMBECTOMY LEFT CEPHALIC VEIN;  Surgeon: Cherre Robins, MD;  Location: [facility] OR;  Service: Vascular;;   Family History  Problem Relation Age of Onset   Breast cancer Mother    Hypertension Mother    Hypertension Father    Pancreatic cancer Other        uncle   Social History:  reports that she has never smoked. She has never used smokeless tobacco. She reports that she does not drink alcohol and does not use drugs. Allergies  Allergen Reactions   Lisinopril Other (See Comments)    Hair fell out    Mercury Hives and Nausea And Vomiting   Prior to Admission medications   Medication Sig Start Date End Date Taking? Authorizing Provider  acetaminophen (TYLENOL) 325 MG tablet Take 2 tablets (650 mg total) by mouth every 4 (four) hours as needed for headache or mild pain. 01/17/21  Yes Richarda Osmond, MD  acetaminophen-codeine (TYLENOL #2) 300-15 MG tablet Take 1 tablet by mouth every 6 (six) hours as needed. 02/28/21  Yes [provider]  apixaban (ELIQUIS) 5 MG TABS tablet Take 1 tablet (5 mg total) by mouth 2 (two) times daily. 01/17/21  Yes Richarda Osmond, MD  carboxymethylcellul-glycerin (OPTIVE) 0.5-0.9 % ophthalmic solution Place 1-2 drops into both eyes 3 (three) times daily as needed for dry eyes.   Yes [provider]  carvedilol (COREG) 25 MG tablet Take 1 tablet (25 mg total) by mouth 2 (two) times daily. 03/09/21 06/07/21 Yes O'Neal, Cassie Freer, MD  Cholecalciferol 25 MCG (1000 UT) tablet Take 2,000 Units by mouth every evening.   Yes [provider]  cilostazol (PLETAL) 50 MG tablet Take 1 tablet (50 mg total) by mouth 2 (two) times daily. 03/15/20  Yes Lorretta Harp, MD  dronabinol (MARINOL) 2.5 MG capsule Take 1 capsule (2.5 mg total) by mouth daily before lunch. 01/17/21  Yes Richarda Osmond, MD  ezetimibe (ZETIA) 10 MG tablet Take 1 tablet (10 mg total) by mouth daily. Patient taking differently: Take 10 mg by mouth every  evening. 10/28/20 07/25/21 Yes Park Liter, MD  famotidine (PEPCID) 20 MG tablet Take 20 mg by mouth in the morning and at bedtime.   Yes [provider]  FLUoxetine (PROZAC) 10 MG capsule Take 1 capsule (10 mg total) by mouth daily. 01/18/21  Yes Richarda Osmond, MD  hydrALAZINE (APRESOLINE) 25 MG tablet Take 25 mg by mouth 3 (three) times daily.   Yes [provider]  hydrocortisone cream 1 % Apply 1 application topically 4 (four) times daily as needed for itching.   Yes [provider]  isosorbide mononitrate (IMDUR) 60 MG 24 hr tablet Take 1 tablet (60 mg total) by mouth daily. 03/09/21  Yes O'Neal, Cassie Freer, MD  ketoconazole (NIZORAL) 2 % cream Apply 1 application topically daily as needed for irritation.  05/08/19  Yes [provider]  levocetirizine (XYZAL) 5 MG tablet Take 5 mg by mouth daily as needed for allergies. 05/12/19  Yes [provider]  loperamide (IMODIUM) 1 MG/5ML solution Take 2 mg by mouth as needed for diarrhea or loose stools.   Yes [provider]  rosuvastatin (CRESTOR) 10 MG tablet Take 1 tablet (10 mg total) by mouth daily. Patient taking differently: Take 10 mg by mouth every evening. 01/18/21  Yes Richarda Osmond, MD   Current Facility-Administered Medications  Medication Dose Route Frequency Provider Last Rate Last Admin   0.9 %  sodium chloride infusion  500 mL Intravenous Once PRN Baglia, Corrina, PA-C       0.9 %  sodium chloride infusion   Intravenous Continuous Baglia, Corrina, PA-C 100 mL/hr at 03/14/21 1233 New Bag at 03/14/21 1233   acetaminophen (TYLENOL) tablet 325-650 mg  325-650 mg Oral Q4H PRN Baglia, Corrina, PA-C       Or   acetaminophen (TYLENOL) suppository 325-650 mg  325-650 mg Rectal Q4H PRN Baglia, Corrina, PA-C       alum & mag hydroxide-simeth (MAALOX/MYLANTA) 200-200-20 MG/5ML suspension 15-30 mL  15-30 mL Oral Q2H PRN Baglia, Corrina, PA-C       [START ON 03/15/2021] apixaban (ELIQUIS) tablet 5 mg  5 mg Oral BID Baglia, Corrina, PA-C       [START ON 03/15/2021] aspirin EC tablet 81 mg  81 mg Oral Q0600 Baglia, Corrina, PA-C       carvedilol (COREG) tablet 25 mg  25 mg Oral BID WC Baglia, Corrina, PA-C       ceFAZolin (ANCEF) IVPB 2g/100 mL premix  2 g Intravenous Q8H Baglia, Corrina, PA-C       [START ON 03/15/2021] docusate sodium (COLACE) capsule 100 mg  100 mg Oral Daily Baglia, Corrina, PA-C       [START ON 03/15/2021] dronabinol (MARINOL) capsule 2.5 mg  2.5 mg Oral QAC lunch Baglia,  Corrina, PA-C       ezetimibe (ZETIA) tablet 10 mg  10 mg Oral QPM Baglia, Corrina, PA-C       famotidine (PEPCID) tablet 20 mg  20 mg Oral BID Baglia, Corrina, PA-C       fentaNYL (SUBLIMAZE) 100 MCG/2ML injection            [START ON 03/15/2021] FLUoxetine (PROZAC) capsule 10 mg  10 mg Oral Daily Baglia, Corrina, PA-C       guaiFENesin-dextromethorphan (ROBITUSSIN DM) 100-10 MG/5ML syrup 15 mL  15 mL Oral Q4H PRN Baglia, Corrina, PA-C       hydrALAZINE (APRESOLINE) injection 5 mg  5 mg Intravenous Q20 Min PRN Baglia, Corrina, PA-C  hydrALAZINE (APRESOLINE) tablet 25 mg  25 mg Oral TID Baglia, Corrina, PA-C       HYDROmorphone (DILAUDID) injection 0.5-1 mg  0.5-1 mg Intravenous Q2H PRN Baglia, Corrina, PA-C       [START ON 03/15/2021] isosorbide mononitrate (IMDUR) 24 hr tablet 60 mg  60 mg Oral Daily Baglia, Corrina, PA-C       labetalol (NORMODYNE) injection 10 mg  10 mg Intravenous Q10 min PRN Baglia, Corrina, PA-C       loratadine (CLARITIN) tablet 10 mg  10 mg Oral Daily PRN Cherre Robins, MD       magnesium sulfate IVPB 2 g 50 mL  2 g Intravenous Daily PRN Baglia, Corrina, PA-C       metoprolol tartrate (LOPRESSOR) injection 2-5 mg  2-5 mg Intravenous Q2H PRN Baglia, Corrina, PA-C       oxyCODONE-acetaminophen (PERCOCET/ROXICET) 5-325 MG per tablet 1-2 tablet  1-2 tablet Oral Q4H PRN Baglia, Corrina, PA-C       [START ON 03/15/2021] pantoprazole (PROTONIX) EC tablet 40 mg  40 mg Oral Daily Baglia, Corrina, PA-C       phenol (CHLORASEPTIC) mouth spray 1 spray  1 spray Mouth/Throat PRN Baglia, Corrina, PA-C       potassium chloride SA (KLOR-CON) CR tablet 20-40 mEq  20-40 mEq Oral Daily PRN Baglia, Corrina, PA-C       rosuvastatin (CRESTOR) tablet 10 mg  10 mg Oral QPM Baglia, Corrina, PA-C       senna-docusate (Senokot-S) tablet 1 tablet  1 tablet Oral QHS PRN Karoline Caldwell, PA-C       Labs: Basic Metabolic Panel: Recent Labs  Lab 03/09/21 1030 03/14/21 0627 03/14/21 1233   NA 136 137  --   K 3.7 3.9  --   CL 97* 98  --   CO2 29  --   --   GLUCOSE 121* 59*  --   BUN 13 19  --   CREATININE 2.84* 2.30* 1.43*  CALCIUM 8.6*  --   --    Liver Function Tests: Recent Labs  Lab 03/09/21 1030  AST 29  ALT 17  ALKPHOS 91  BILITOT 0.9  PROT 6.9  ALBUMIN 3.1*   CBC: Recent Labs  Lab 03/09/21 1030 03/14/21 0627 03/14/21 1233  WBC 7.5  --  5.8  HGB 12.6 14.6 8.8*  HCT 41.7 43.0 29.0*  MCV 90.8  --  90.9  PLT 230  --  233   CBG: Recent Labs  Lab 03/09/21 0923 03/14/21 0618 03/14/21 0641 03/14/21 0728 03/14/21 1024  GLUCAP 88 43* 110* 128* 126*    Studies/Results: No results found.  ROS: All others negative except those listed in HPI.  Physical Exam: Vitals:   03/14/21 1108 03/14/21 1115 03/14/21 1123 03/14/21 1214  BP: (!) 110/46  113/61 131/60  Pulse: (!) 50 (!) 58 61 62  Resp: 13 13 17 14   Temp:    (!) 97.4 F (36.3 C)  TempSrc:    Oral  SpO2: 94% 100% 99% 99%  Weight:      Height:         General: WDWN female in NAD Head: NCAT sclera not icteric MMM Neck: Supple. No lymphadenopathy Lungs: CTA bilaterally. No wheeze, rales or rhonchi. Breathing is unlabored. Heart: RRR. No murmur, rubs or gallops.  Abdomen: soft, nontender, +BS, no guarding, no rebound tenderness Lower extremities:no edema b/l, L foot dressed Neuro: Drowsy, oriented x3. Moves all extremities spontaneously. Psych:  Responds to questions  appropriately with a normal affect. Dialysis Access: LU AVF maturing, TDC   Dialysis Orders:  [facility]  4hrs, BFR 400, DFR AF 1.5,  EDW 59.8kg, 3K/ 2.25Ca  Access: TDC  Heparin 2000   Assessment/Plan:  PAD/ischemic ulceration L great toe - L common femoral anterior tibial bypass, and wound debridement today by Dr. Stanford Breed  ESRD -  on HD TTS.  Orders written for HD tomorrow per regular schedule  Hypertension/volume  - Blood pressure well controlled.  Does not appear volume overloaded.  UF as tolerated.   Anemia of  CKD - Hgb 8.8 post surgery.  Just completed iron infusion as outpatient.  Will order ESA.    Secondary Hyperparathyroidism - Labs in goal as outpatient.  Not on VDRA or binders.   Nutrition - Carb modified diet with fluid restrictions.   DMT2 - per PMD 8. Anxiety - panic attacks with associated desaturations at outpatient dialysis.  Follow closely.    Jen Mow, PA-C [employer] 03/14/2021, 2:40 PM

## 2021-03-14 NOTE — Interval H&P Note (Signed)
History and Physical Interval Note:  03/14/2021 7:16 AM  Molly [name]  has presented today for surgery, with the diagnosis of Critical limb ischemia of left lower extremity.  The various methods of treatment have been discussed with the patient and family. After consideration of risks, benefits and other options for treatment, the patient has consented to  Procedure(s): [place] (Left) as a surgical intervention.  The patient's history has been reviewed, patient examined, no change in status, stable for surgery.  I have reviewed the patient's chart and labs.  Questions were answered to the patient's satisfaction.     Cherre Robins

## 2021-03-14 NOTE — Progress Notes (Signed)
Orthopedic Tech Progress Note Patient Details:  Molly [name] 06/15/1950 [id number]  Patient ID: Molly [name], female   DOB: 11-Apr-1951, 70 y.o.   MRN: [id number] Order placed with [facility] for heel offloading shoe.  Vernona Rieger 03/14/2021, 2:09 PM

## 2021-03-14 NOTE — Transfer of Care (Signed)
Immediate Anesthesia Transfer of Care Note  Patient: Molly [name]  Procedure(s) Performed: LEFT common FEMORAL-ANTERIOR TIBIAL ARTERY BYPASS woth Gortex (Left: Leg Upper) LEFT FOOT DEBRIDEMENT (Left: Foot)  Patient Location: PACU  Anesthesia Type:General  Level of Consciousness: awake and alert   Airway & Oxygen Therapy: Patient Spontanous Breathing and Patient connected to face mask oxygen  Post-op Assessment: Report given to RN and Post -op Vital signs reviewed and stable  Post vital signs: Reviewed and stable  Last Vitals:  Vitals Value Taken Time  BP 138/40 03/14/21 1025  Temp    Pulse 64 03/14/21 1028  Resp 15 03/14/21 1028  SpO2 98 03/14/21 1028  Vitals shown include unvalidated device data.  Last Pain:  Vitals:   03/14/21 0631  TempSrc:   PainSc: 6       Patients Stated Pain Goal: 2 (53/64/68 0321)  Complications: No notable events documented.

## 2021-03-14 NOTE — Progress Notes (Signed)
Pt arrived from PACU, VSS, CHG complete, Groin level 0 and soft, dressing clean dry and intact, pulses dopplered. Orders released. Tele initiated.   Chrisandra Carota, RN 03/14/2021 12:10 PM

## 2021-03-15 ENCOUNTER — Encounter (HOSPITAL_COMMUNITY): Payer: Self-pay | Admitting: Vascular Surgery

## 2021-03-15 LAB — LIPID PANEL
Cholesterol: 118 mg/dL (ref 0–200)
HDL: 51 mg/dL (ref >40)
LDL Cholesterol: 52 mg/dL (ref 0–99)
Total CHOL/HDL Ratio: 2.3 RATIO
Triglycerides: 75 mg/dL (ref <150)
VLDL: 15 mg/dL (ref 0–40)

## 2021-03-15 LAB — CBC
HCT: 35.8 % — ABNORMAL LOW (ref 36.0–46.0)
Hemoglobin: 10.8 g/dL — ABNORMAL LOW (ref 12.0–15.0)
MCH: 27.5 pg (ref 26.0–34.0)
MCHC: 30.2 g/dL (ref 30.0–36.0)
MCV: 91.1 fL (ref 80.0–100.0)
Platelets: 348 10*3/uL (ref 150–400)
RBC: 3.93 MIL/uL (ref 3.87–5.11)
RDW: 16.8 % — ABNORMAL HIGH (ref 11.5–15.5)
WBC: 5.2 10*3/uL (ref 4.0–10.5)
nRBC: 0 % (ref 0.0–0.2)

## 2021-03-15 LAB — BASIC METABOLIC PANEL
Anion gap: 10 (ref 5–15)
BUN: 25 mg/dL — ABNORMAL HIGH (ref 8–23)
CO2: 22 mmol/L (ref 22–32)
Calcium: 7.5 mg/dL — ABNORMAL LOW (ref 8.9–10.3)
Chloride: 101 mmol/L (ref 98–111)
Creatinine, Ser: 2.85 mg/dL — ABNORMAL HIGH (ref 0.44–1.00)
GFR, Estimated: 17 mL/min — ABNORMAL LOW (ref >60)
Glucose, Bld: 166 mg/dL — ABNORMAL HIGH (ref 70–99)
Potassium: 4 mmol/L (ref 3.5–5.1)
Sodium: 133 mmol/L — ABNORMAL LOW (ref 135–145)

## 2021-03-15 LAB — HEPATITIS B SURFACE ANTIBODY,QUALITATIVE: Hep B S Ab: REACTIVE — AB

## 2021-03-15 LAB — GLUCOSE, CAPILLARY
Glucose-Capillary: 173 mg/dL — ABNORMAL HIGH (ref 70–99)
Glucose-Capillary: 167 mg/dL — ABNORMAL HIGH (ref 70–99)
Glucose-Capillary: 163 mg/dL — ABNORMAL HIGH (ref 70–99)

## 2021-03-15 LAB — HEPATITIS B SURFACE ANTIGEN: Hepatitis B Surface Ag: NONREACTIVE

## 2021-03-15 MED ORDER — LIDOCAINE HCL (PF) 1 % IJ SOLN: 5.0000 mL | INTRAMUSCULAR | Status: DC | PRN

## 2021-03-15 MED ORDER — PENTAFLUOROPROP-TETRAFLUOROETH EX AERO: 1.0000 "application " | INHALATION_SPRAY | CUTANEOUS | Status: DC | PRN

## 2021-03-15 MED ORDER — SODIUM CHLORIDE 0.9 % IV SOLN: 100.0000 mL | INTRAVENOUS | Status: DC | PRN

## 2021-03-15 MED ORDER — HEPARIN SODIUM (PORCINE) 1000 UNIT/ML DIALYSIS: 1000.0000 [IU] | INTRAMUSCULAR | Status: DC | PRN

## 2021-03-15 MED ORDER — LIDOCAINE-PRILOCAINE 2.5-2.5 % EX CREA: 1.0000 "application " | TOPICAL_CREAM | CUTANEOUS | Status: DC | PRN

## 2021-03-15 MED ORDER — HEPARIN SODIUM (PORCINE) 1000 UNIT/ML IJ SOLN
INTRAMUSCULAR | Status: AC
  Filled 2021-03-15: qty 4

## 2021-03-15 MED ORDER — ALTEPLASE 2 MG IJ SOLR: 2.0000 mg | Freq: Once | INTRAMUSCULAR | Status: DC | PRN

## 2021-03-15 MED ORDER — INSULIN ASPART 100 UNIT/ML IJ SOLN
0.0000 [IU] | INTRAMUSCULAR | Status: DC
Start: 2021-03-15 — End: 2021-03-18
  Administered 2021-03-15 (×2): 1 [IU] via SUBCUTANEOUS

## 2021-03-15 NOTE — Progress Notes (Signed)
Pt came back to rm 15 from dialysis. Reinitiated tele. VSS. Call bell within reach.   Bethaney Oshana S Amberley Hamler, RN  

## 2021-03-15 NOTE — Progress Notes (Signed)
Hemodialysis- Tolerated treatment well without issue. 1.5L removed. Patient currently without complaints. Husband informed patient will be back to room around 1030am. Reported off to primary RN.

## 2021-03-15 NOTE — Progress Notes (Signed)
PHARMACIST LIPID MONITORING   Molly [name] is a 70 y.o. female admitted on 03/14/2021 for peripheral bypass and L great toe and heel debridement.  Pharmacy has been consulted to optimize lipid-lowering therapy with the indication of secondary prevention for clinical ASCVD.  Recent Labs:  Lipid Panel (last 6 months):   Lab Results  Component Value Date   CHOL 118 03/15/2021   TRIG 75 03/15/2021   HDL 51 03/15/2021   CHOLHDL 2.3 03/15/2021   VLDL 15 03/15/2021   LDLCALC 52 03/15/2021    Hepatic function panel (last 6 months):   Lab Results  Component Value Date   AST 29 03/09/2021   ALT 17 03/09/2021   ALKPHOS 91 03/09/2021   BILITOT 0.9 03/09/2021   BILIDIR <0.1 01/05/2021    SCr (since admission):   Serum creatinine: 2.85 mg/dL (H) 03/15/21 0428 Estimated creatinine clearance: 16.3 mL/min (A)  Current therapy and lipid therapy tolerance Current lipid-lowering therapy: Crestor 10 mg daily Previous lipid-lowering therapies (if applicable): Crestor 40 mg daily Documented or reported allergies or intolerances to lipid-lowering therapies (if applicable): none  Assessment:    ESRD, excluded from protocol, but discussed with patient.  LDL 107 on 08/31/20, 52 today.  Crestor dose decreased from 40 to 10 mg daily during Sept 2022 admit. Maximum recommended Crestor dose with ESRD is 10 mg daily.  Plan:   No statin changes. Continue Crestor 10 mg daily   Arty Baumgartner, [place] 03/15/2021, 5:32 PM

## 2021-03-15 NOTE — Progress Notes (Signed)
PT Cancellation Note  Patient Details Name: Molly [name] MRN: [id number] DOB: 1950-09-18   Cancelled Treatment:    Reason Eval/Treat Not Completed: Patient at procedure or test/unavailable (HD). Will follow-up for PT Evaluation as schedule permits.  Mabeline Caras, PT, DPT Acute Rehabilitation Services  Pager [phone] Office [place] 03/15/2021, 7:49 AM

## 2021-03-15 NOTE — Evaluation (Signed)
Occupational Therapy Evaluation Patient Details Name: Molly [name] MRN: [id number] DOB: [birth year] Today's Date: 03/15/2021   History of Present Illness Pt is 70 yo female who presented with Atherosclerosis of native arteries of left lower extremity causing ulceration. Pt is now s/p Left fem- AT bypass with PTFE and debridement of left great toe and heel. PMH significant for HTN, A fib, diastolic dysfunction, renal artery stenosis s/p stent placement, obstructive uropathy, DMT2, PAD and chronic L great toe ulcer.   Clinical Impression   Pt admitted with the above diagnoses and presents with below problem list. Pt will benefit from continued acute OT to address the below listed deficits and maximize independence with basic ADLs prior to d/c to venue below. PTA pt was able to complete transfers to w/c with +1 assist, working on walking during PT sessions, [facility] aide assists with bathing 1x/week. Pt lives with spouse. Pt currently needing up to mod A +2 to  take pivotal steps to access recliner. Spouse present at start of session. Discussed d/c plan at end of session with pt declining [facility] SN[facility] for further rehab and plans to d/c home.       Recommendations for follow up therapy are one component of a multi-disciplinary discharge planning process, led by the attending physician.  Recommendations may be updated based on patient status, additional functional criteria and insurance authorization.   Follow Up Recommendations  Home health OT (pt declined [facility] SNF for rehab)   Assistance Recommended at Discharge Frequent or constant Supervision/Assistance  Functional Status Assessment  Patient has had a recent decline in their functional status and demonstrates the ability to make significant improvements in function in a reasonable and predictable amount of time.  Equipment Recommendations  None recommended by OT    Recommendations for Other Services       Precautions / Restrictions  Precautions Precautions: Fall Required Braces or Orthoses: Other Brace Other Brace: bulky ACE wrap dressing on L foot Restrictions Other Position/Activity Restrictions: [place] shoe noted in room but utilized today d/t bulky ACE wrap dressing in place      Mobility Bed Mobility Overal bed mobility: Needs Assistance Bed Mobility: Supine to Sit     Supine to sit: Min guard;HOB elevated     General bed mobility comments: min guard for safety.    Transfers Overall transfer level: Needs assistance Equipment used: Rolling walker (2 wheels);2 person hand held assist Transfers: Sit to/from Stand;Bed to chair/wheelchair/BSC Sit to Stand: Mod assist;+2 physical assistance;From elevated surface     Step pivot transfers: Mod assist;+2 physical assistance;From elevated surface     General transfer comment: stood 2x from EOB with mod A. Stood about 10 seconds on initial stand before initiating sitting back down d/t feeling weak. Seated rest break then stood again this time with +2 HHA and able to take pivot steps mod A+2.      Balance Overall balance assessment: Needs assistance Sitting-balance support: Single extremity supported;Feet supported;Bilateral upper extremity supported Sitting balance-Leahy Scale: Fair     Standing balance support: Reliant on assistive device for balance;Bilateral upper extremity supported Standing balance-Leahy Scale: Poor Standing balance comment: rw and up to mod A to static stand                           ADL either performed or assessed with clinical judgement   ADL Overall ADL's : Needs assistance/impaired Eating/Feeding: Set up;Sitting   Grooming: Moderate assistance;Sitting   Upper Body  Bathing: Moderate assistance;Sitting   Lower Body Bathing: +2 for physical assistance;Sit to/from stand;Moderate assistance   Upper Body Dressing : Moderate assistance;Sitting   Lower Body Dressing: Moderate assistance;+2 for physical  assistance;Sit to/from stand   Toilet Transfer: Moderate assistance;+2 for physical assistance;Stand-pivot;BSC/3in1   Toileting- Clothing Manipulation and Hygiene: Moderate assistance;+2 for physical assistance;Sitting/lateral lean;+2 for safety/equipment;Sit to/from stand         General ADL Comments: Pt completed bed mobility, sat EOB a few minutes then stood for about 10 seconds with mod A +2. Seated rest break then able to complete step-pivot to recliner with +2 HHA.     Vision         Perception     Praxis      Pertinent Vitals/Pain Pain Assessment: Faces Faces Pain Scale: Hurts little more Pain Location: L foot Pain Descriptors / Indicators: Grimacing;Guarding Pain Intervention(s): Monitored during session;Limited activity within patient's tolerance;Repositioned     Hand Dominance Right   Extremity/Trunk Assessment Upper Extremity Assessment Upper Extremity Assessment: Generalized weakness;RUE deficits/detail;LUE deficits/detail RUE Deficits / Details: edema noted in hands LUE Deficits / Details: edema noted in hands   Lower Extremity Assessment Lower Extremity Assessment: Defer to PT evaluation       Communication Communication Communication: Expressive difficulties   Cognition Arousal/Alertness: Awake/alert Behavior During Therapy: [facility] for tasks assessed/performed;Flat affect;Anxious Overall Cognitive Status: Impaired/Different from baseline Area of Impairment: Attention;Memory;Following commands;Safety/judgement;Awareness;Problem solving;Orientation                 Orientation Level: Place;Time;Situation Current Attention Level: Focused Memory: Decreased short-term memory;Decreased recall of precautions Following Commands: Follows one step commands with increased time;Follows one step commands inconsistently Safety/Judgement: Decreased awareness of safety;Decreased awareness of deficits Awareness: Intellectual Problem Solving: Slow  processing;Difficulty sequencing;Requires verbal cues General Comments: Spouse present at start of session and providing PLOF data as pt with delayed responses and decreased recall. Needs step-by-step cueing and reduced environmental distractions during ADL tasks     General Comments  On 2L/min O2 via [place]. Unable to get reliable O2 read, kept on 2L throughout session.    Exercises     Shoulder Instructions      Home Living Family/patient expects to be discharged to:: Private residence Living Arrangements: Spouse/significant other Available Help at Discharge: Family;Available 24 hours/day Type of Home: House Home Access: Ramped entrance     Home Layout: One level               Home Equipment: BSC/3in1;Wheelchair - Publishing copy (2 wheels)   Additional Comments: Working with HHPT on ambulation with RW. Does "not really" wear home O2      Prior Functioning/Environment Prior Level of Function : Needs assist       Physical Assist : Mobility (physical);ADLs (physical)   ADLs (physical): Bathing;Dressing Mobility Comments: Requires assist from husband for transfers at wheelchair-level; typically only ambulating with HHPT due to pain in heel from ulcer. Able to self-propel w/c for a bit depending on level of activity ADLs Comments: Aide assists 1x per week with bathing        OT Problem List: Impaired balance (sitting and/or standing);Decreased strength;Decreased activity tolerance;Decreased knowledge of use of DME or AE;Decreased knowledge of precautions;Pain;Cardiopulmonary status limiting activity;Decreased cognition;Decreased safety awareness      OT Treatment/Interventions:      OT Goals(Current goals can be found in the care plan section) Acute Rehab OT Goals Patient Stated Goal: home OT Goal Formulation: With patient/family Time For Goal Achievement: 03/29/21 Potential to Achieve  Goals: Good  OT Frequency: Min 2X/week   Barriers to D/C:             Co-evaluation PT/OT/SLP Co-Evaluation/Treatment: Yes Reason for Co-Treatment: For patient/therapist safety;To address functional/ADL transfers   OT goals addressed during session: ADL's and self-care      AM-PAC OT "6 Clicks" Daily Activity     Outcome Measure Help from another person eating meals?: A Little Help from another person taking care of personal grooming?: A Lot Help from another person toileting, which includes using toliet, bedpan, or urinal?: Total Help from another person bathing (including washing, rinsing, drying)?: Total Help from another person to put on and taking off regular upper body clothing?: A Little Help from another person to put on and taking off regular lower body clothing?: Total 6 Click Score: 11   End of Session Equipment Utilized During Treatment: Rolling walker (2 wheels);Gait belt;Oxygen (2L/min) Nurse Communication: Mobility status  Activity Tolerance: Patient limited by fatigue Patient left: in chair;with call bell/phone within reach;with chair alarm set  OT Visit Diagnosis: Unsteadiness on feet (R26.81);Other abnormalities of gait and mobility (R26.89);Muscle weakness (generalized) (M62.81);Other symptoms and signs involving cognitive function;Pain Pain - Right/Left: Left Pain - part of body: Ankle and joints of foot                Time: 8299-3716 OT Time Calculation (min): 31 min Charges:  OT General Charges $OT Visit: 1 Visit OT Evaluation $OT Eval Moderate Complexity: [place], OT Acute Rehabilitation Services Pager: [phone] Office: [phone]   Hortencia Pilar 03/15/2021, 3:02 PM

## 2021-03-15 NOTE — Evaluation (Signed)
Physical Therapy Evaluation Patient Details Name: Molly [name] MRN: [id number] DOB: 1951-03-18 Today's Date: 03/15/2021  History of Present Illness  Pt is a 70 y.o. female admitted on 03/14/21 for left femoral -anterior tibial bypass  and debridment of L great toe and heel. PMH includes DM, CHF, HTN, PVD, A-fib, ESRD on HD, osteopenia, proximal leg weakness.   Clinical Impression  Pt presents with generalized weakness, decreased functional mobility, impaired balance, and decreased activity tolerance secondary to above. PTA, pt lived with husband who assists with majority of mobility at wheelchair level; pt working with [place] on ambulation. Currently, pt needs mod A +2 for standing transfers. Discussed possible SNF placement, but pt not interested and reports her husband is able to provide necessary assist. Therefore, recommend continued [facility] PT after discharge to maximize functional mobility and independence. Will continue to follow acutely to address short-term PT goals.        Recommendations for follow up therapy are one component of a multi-disciplinary discharge planning process, led by the attending physician.  Recommendations may be updated based on patient status, additional functional criteria and insurance authorization.  Follow Up Recommendations Home health PT    Assistance Recommended at Discharge Frequent or constant Supervision/Assistance  Functional Status Assessment Patient has had a recent decline in their functional status and demonstrates the ability to make significant improvements in function in a reasonable and predictable amount of time.  Equipment Recommendations  None recommended by PT    Recommendations for Other Services       Precautions / Restrictions Precautions Precautions: Fall Required Braces or Orthoses: Other Brace Other Brace: bulky ACE wrap dressing on L foot Restrictions Other Position/Activity Restrictions: Post-op shoe noted in room but was not  utilized today d/t bulky ACE wrap dressing in place      Mobility  Bed Mobility Overal bed mobility: Needs Assistance Bed Mobility: Supine to Sit     Supine to sit: Min guard;HOB elevated     General bed mobility comments: min guard for safety; good use of UE with bed rails; able to scoot to EOB    Transfers Overall transfer level: Needs assistance Equipment used: Rolling walker (2 wheels);2 person hand held assist Transfers: Sit to/from Stand;Bed to chair/wheelchair/BSC Sit to Stand: Mod assist;+2 physical assistance;From elevated surface   Step pivot transfers: Mod assist;+2 physical assistance;From elevated surface       General transfer comment: failed attempt to stand with +1 assist. Stood 2x from EOB with mod A +2. Stood about 10 seconds on initial stand before initiating sitting back down d/t feeling weak and lightheaded. Seated rest break then stood again this time with +2 HHA and able to take pivot steps to recliner mod A+2 for stability. Cues needed for sequencing    Ambulation/Gait                  Stairs            Wheelchair Mobility    Modified Rankin (Stroke Patients Only)       Balance Overall balance assessment: Needs assistance Sitting-balance support: Single extremity supported;Feet supported;Bilateral upper extremity supported Sitting balance-Leahy Scale: Fair Sitting balance - Comments: Able to sit EOB without physical assist   Standing balance support: Reliant on assistive device for balance;Bilateral upper extremity supported Standing balance-Leahy Scale: Poor Standing balance comment: reliant on BUE with RW and physical assist to stand  Pertinent Vitals/Pain Pain Assessment: Faces Faces Pain Scale: Hurts little more Pain Location: L foot Pain Descriptors / Indicators: Grimacing;Guarding Pain Intervention(s): Monitored during session;Limited activity within patient's  tolerance;Repositioned    Home Living Family/patient expects to be discharged to:: Private residence Living Arrangements: Spouse/significant other Available Help at Discharge: Family;Available 24 hours/day Type of Home: House Home Access: Ramped entrance       Home Layout: One level Home Equipment: BSC/3in1;Wheelchair - Publishing copy (2 wheels) Additional Comments: Working with HHPT on ambulation with RW. Does "not really" wear home O2    Prior Function Prior Level of Function : Needs assist       Physical Assist : Mobility (physical);ADLs (physical)   ADLs (physical): Bathing;Dressing Mobility Comments: Requires assist from husband for transfers at wheelchair-level; typically only ambulating with HHPT due to pain in heel from ulcer. Able to self-propel w/c for a bit depending on level of activity ADLs Comments: Aide assists 1x per week with bathing     Hand Dominance   Dominant Hand: Right    Extremity/Trunk Assessment   Upper Extremity Assessment Upper Extremity Assessment: Generalized weakness;RUE deficits/detail;LUE deficits/detail RUE Deficits / Details: edema noted in hands LUE Deficits / Details: edema noted in hands    Lower Extremity Assessment Lower Extremity Assessment: Generalized weakness       Communication   Communication: Expressive difficulties  Cognition Arousal/Alertness: Awake/alert Behavior During Therapy: WFL for tasks assessed/performed;Anxious;Flat affect Overall Cognitive Status: Impaired/Different from baseline Area of Impairment: Attention;Memory;Following commands;Safety/judgement;Awareness;Problem solving;Orientation                 Orientation Level: Place;Time;Situation Current Attention Level: Focused Memory: Decreased short-term memory;Decreased recall of precautions Following Commands: Follows one step commands with increased time;Follows one step commands inconsistently Safety/Judgement: Decreased awareness of  safety;Decreased awareness of deficits Awareness: Intellectual Problem Solving: Slow processing;Difficulty sequencing;Requires verbal cues General Comments: Spouse present at start of session and providing PLOF data as pt with delayed responses and decreased recall. Needs step-by-step cueing and reduced environmental distractions during mobility        General Comments General comments (skin integrity, edema, etc.): On 2L O2 throughout session. unable to get reliable O2 read. BP 100/53 right after standing, 94/48 seated, legs elevated after therapy. Discussed possible SNF placement but pt declined and wants to go home with [facility]    Exercises     Assessment/Plan    PT Assessment Patient needs continued PT services  PT Problem List Decreased strength;Decreased mobility;Decreased safety awareness;Decreased activity tolerance;Decreased cognition;Decreased balance       PT Treatment Interventions Gait training;DME instruction;Therapeutic exercise;Balance training;Functional mobility training;Therapeutic activities;Patient/family education    PT Goals (Current goals can be found in the Care Plan section)  Acute Rehab PT Goals Patient Stated Goal: to return home PT Goal Formulation: With patient Time For Goal Achievement: 03/29/21 Potential to Achieve Goals: Fair    Frequency Min 3X/week   Barriers to discharge        Co-evaluation PT/OT/SLP Co-Evaluation/Treatment: Yes Reason for Co-Treatment: For patient/therapist safety;To address functional/ADL transfers PT goals addressed during session: Mobility/safety with mobility;Balance OT goals addressed during session: ADL's and self-care       AM-PAC PT "6 Clicks" Mobility  Outcome Measure Help needed turning from your back to your side while in a flat bed without using bedrails?: A Little Help needed moving from lying on your back to sitting on the side of a flat bed without using bedrails?: A Little Help needed moving to and from  a  bed to a chair (including a wheelchair)?: Total Help needed standing up from a chair using your arms (e.g., wheelchair or bedside chair)?: Total Help needed to walk in hospital room?: Total Help needed climbing 3-5 steps with a railing? : Total 6 Click Score: 10    End of Session Equipment Utilized During Treatment: Gait belt;Oxygen Activity Tolerance: Patient tolerated treatment well Patient left: in chair;with chair alarm set;with call bell/phone within reach Nurse Communication: Mobility status PT Visit Diagnosis: Unsteadiness on feet (R26.81);Other abnormalities of gait and mobility (R26.89);Muscle weakness (generalized) (M62.81);Difficulty in walking, not elsewhere classified (R26.2)    Time: 0349-1791 PT Time Calculation (min) (ACUTE ONLY): 39 min   Charges:   PT Evaluation $PT Eval Moderate Complexity: 1 [address] SPT   Brandon Melnick 03/15/2021, 5:17 PM

## 2021-03-15 NOTE — Progress Notes (Signed)
[facility] Progress Note  Subjective: pt seen in room, no c/o's today  Vitals:   03/15/21 1005 03/15/21 1022 03/15/21 1046 03/15/21 1146  BP: 132/66 136/66 (!) 117/45 (!) 124/49  Pulse:   66 62  Resp: 19 20 17 18   Temp: (!) 97.4 F (36.3 C)  97.7 F (36.5 C) 98 F (36.7 C)  TempSrc:   Oral Oral  SpO2: 98%  96% 94%  Weight: 61.5 kg     Height:        Exam:  alert, nad   no jvd  Chest cta bilat  Cor reg no RG  Abd soft ntnd no ascites   Ext no LE edema   Alert, NF, ox3   LU AVF maturing, TDC    Dialysis Orders:  [facility] - [place]  4h    400/1.5  59.8kg  3K/2.25 bath  LU AVF maturing/ TDC Hep 2000  - ? meds     Assessment/Plan:  PAD/ischemic ulceration L great toe - SP L fem- AT bypass w/ wound I&D on 11/28 by Dr. Stanford Breed  ESRD -  on HD TTS.  HD today  Hypertension/volume  - Blood pressure well controlled.  Does not appear volume overloaded.  UF as tolerated.   Anemia of CKD - Hgb 8.8 post surgery.  Just completed iron infusion as outpatient.  Will order ESA.    Secondary Hyperparathyroidism - Labs in goal as outpatient.  Not on VDRA or binders.   Nutrition - Carb modified diet with fluid restrictions.   DMT2 - per PMD Anxiety - panic attacks with associated desaturations at outpatient dialysis.  Follow closely.     Rob Singleton [place] 03/15/2021, 12:23 PM   Recent Labs  Lab 03/09/21 1030 03/14/21 0627 03/14/21 1233 03/15/21 0428  K 3.7 3.9  --  4.0  BUN 13 19  --  25*  CREATININE 2.84* 2.30* 1.43* 2.85*  CALCIUM 8.6*  --   --  7.5*  HGB 12.6 14.6 8.8* 10.8*   Inpatient medications:  apixaban  5 mg Oral BID   aspirin EC  81 mg Oral Q0600   carvedilol  25 mg Oral BID WC   Chlorhexidine Gluconate Cloth  6 each Topical Q0600   docusate sodium  100 mg Oral Daily   dronabinol  2.5 mg Oral QAC lunch   ezetimibe  10 mg Oral QPM   famotidine  20 mg Oral BID   FLUoxetine  10 mg Oral Daily   hydrALAZINE  25 mg Oral TID   insulin aspart  0-6 Units  Subcutaneous Q4H   isosorbide mononitrate  60 mg Oral Daily   pantoprazole  40 mg Oral Daily   rosuvastatin  10 mg Oral QPM    sodium chloride     sodium chloride 20 mL/hr at 03/14/21 2324   magnesium sulfate bolus IVPB     sodium chloride, acetaminophen **OR** acetaminophen, alum & mag hydroxide-simeth, guaiFENesin-dextromethorphan, hydrALAZINE, HYDROmorphone (DILAUDID) injection, labetalol, loratadine, magnesium sulfate bolus IVPB, metoprolol tartrate, oxyCODONE-acetaminophen, phenol, potassium chloride, senna-docusate

## 2021-03-15 NOTE — Progress Notes (Signed)
Mobility Specialist Progress Note    03/15/21 1636  Mobility  Activity Transferred:  Chair to bed  Level of Assistance +2 (takes two people)  Assistive Device Sliding board  Mobility Out of bed to chair with meals  Mobility Response Tolerated well  Mobility performed by Mobility specialist;Nurse tech  $Mobility charge 1 Mobility   Pt left with husband and RN present and call bell in reach.   [facility] Mobility Specialist  M.S. Primary Phone: [phone] M.S. Secondary Phone: [phone]

## 2021-03-15 NOTE — Progress Notes (Addendum)
  Progress Note    03/15/2021 10:20 AM 1 Day Post-Op  Subjective:  seen in HD. No complaints   Vitals:   03/15/21 0930 03/15/21 1000  BP: (!) 127/54 (!) 137/57  Pulse:    Resp: 13 14  Temp:    SpO2:     Physical Exam: Cardiac:  regular Lungs:  non labored Incisions:  left groin and left AT incisions are clean, dry and intact without swelling or hematoma Extremities:  well perfused and warm. Unable to palpable pulse in left fem- at bypass. Did not have doppler to listen for signals. Motor and sensation intact. Left foot dressed Abdomen:  flat, soft Neurologic: alert and oriented  CBC    Component Value Date/Time   WBC 5.2 03/15/2021 0428   RBC 3.93 03/15/2021 0428   HGB 10.8 (L) 03/15/2021 0428   HGB 12.5 08/19/2019 1111   HCT 35.8 (L) 03/15/2021 0428   HCT 39.4 08/19/2019 1111   PLT 348 03/15/2021 0428   PLT 299 08/19/2019 1111   MCV 91.1 03/15/2021 0428   MCV 86 08/19/2019 1111   MCH 27.5 03/15/2021 0428   MCHC 30.2 03/15/2021 0428   RDW 16.8 (H) 03/15/2021 0428   RDW 12.7 08/19/2019 1111   LYMPHSABS 1.3 11/26/2020 1226   MONOABS 0.5 11/26/2020 1226   EOSABS 0.1 11/26/2020 1226   BASOSABS 0.0 11/26/2020 1226    BMET    Component Value Date/Time   NA 133 (L) 03/15/2021 0428   NA 141 09/03/2019 1600   K 4.0 03/15/2021 0428   CL 101 03/15/2021 0428   CO2 22 03/15/2021 0428   GLUCOSE 166 (H) 03/15/2021 0428   BUN 25 (H) 03/15/2021 0428   BUN 24 09/03/2019 1600   CREATININE 2.85 (H) 03/15/2021 0428   CALCIUM 7.5 (L) 03/15/2021 0428   GFRNONAA 17 (L) 03/15/2021 0428   GFRAA 59 (L) 09/03/2019 1600    INR    Component Value Date/Time   INR 1.1 03/09/2021 1030     Intake/Output Summary (Last 24 hours) at 03/15/2021 1020 Last data filed at 03/15/2021 0452 Gross per 24 hour  Intake 1471.08 ml  Output 25 ml  Net 1446.08 ml     Assessment/Plan:  70 y.o. female is s/p Left fem- AT bypass with PTFE and debridement of left great toe and heel 1 Day  Post-Op   Patient seen in HD Hemodynamically stable Incisions are intact and well appearing without swelling or hematoma Unable to palpate pulse in bypass but leg is warm and well perfused. Motor and sensation intact Will start Apixaban today Left foot dressings will take down tomorrow Heel offloading shoe ordered PT/OT to evaluate later today  DVT prophylaxis: Apixaban   Karoline Caldwell, PA-C [facility] [phone] 03/15/2021 10:20 AM  VASCULAR STAFF ADDENDUM: I have independently interviewed and examined the patient. I agree with the above.  No doppler in HD unit. Incisions healing well.  Leg appears well perfused. Mobilize after HD.  Yevonne Aline. Stanford Breed, MD [facility] Phone Number: [phone] 03/15/2021 12:41 PM

## 2021-03-16 ENCOUNTER — Ambulatory Visit: Payer: Medicare Other | Admitting: Podiatry

## 2021-03-16 DIAGNOSIS — Z992 Dependence on renal dialysis: Secondary | ICD-10-CM | POA: Diagnosis not present

## 2021-03-16 DIAGNOSIS — I129 Hypertensive chronic kidney disease with stage 1 through stage 4 chronic kidney disease, or unspecified chronic kidney disease: Secondary | ICD-10-CM | POA: Diagnosis not present

## 2021-03-16 DIAGNOSIS — N186 End stage renal disease: Secondary | ICD-10-CM | POA: Diagnosis not present

## 2021-03-16 LAB — GLUCOSE, CAPILLARY
Glucose-Capillary: 114 mg/dL — ABNORMAL HIGH (ref 70–99)
Glucose-Capillary: 124 mg/dL — ABNORMAL HIGH (ref 70–99)
Glucose-Capillary: 127 mg/dL — ABNORMAL HIGH (ref 70–99)
Glucose-Capillary: 128 mg/dL — ABNORMAL HIGH (ref 70–99)
Glucose-Capillary: 145 mg/dL — ABNORMAL HIGH (ref 70–99)
Glucose-Capillary: 156 mg/dL — ABNORMAL HIGH (ref 70–99)

## 2021-03-16 LAB — HEPATITIS B SURFACE ANTIBODY, QUANTITATIVE: Hep B S AB Quant (Post): 94.6 m[IU]/mL (ref >9.9)

## 2021-03-16 MED ORDER — FAMOTIDINE 20 MG PO TABS
20.0000 mg | ORAL_TABLET | Freq: Every day | ORAL | Status: DC
  Administered 2021-03-16 – 2021-03-18 (×3): 20 mg via ORAL
  Filled 2021-03-16 (×3): qty 1

## 2021-03-16 NOTE — Progress Notes (Signed)
Pt is more alert and awake. Bp 114/64 mmHg after 500 ml bolus. She pulled her IV cath out. She had complaints of bilateral foot pain and requested pain med. Percocet 1 tab was given. Per RN day shift reported yesterday. Pt was quit drowsy after she got Percocet at am. We will monitor  Kennyth Lose, RN

## 2021-03-16 NOTE — Progress Notes (Signed)
BP 96/48 (MAP 58), HR 57, RR 14, SPO2 95 on room air. NSS 500 ml IV bolus given to keep SBP > 100 per standing order.  Pt is drowsy and lethargy, but able to follow commands and oriented x 4.  Possibly volume lost from hemodialysis output today 1,502 ml. We will continue to monitor.  Kennyth Lose, RN

## 2021-03-16 NOTE — Progress Notes (Addendum)
  Progress Note    03/16/2021 7:14 AM 2 Days Post-Op  Subjective:  says severe burning pain in left foot since this morning   Vitals:   03/16/21 0337 03/16/21 0624  BP: (!) 113/43 114/64  Pulse: 61 62  Resp: 15 18  Temp: 98.2 F (36.8 C) 98.2 F (36.8 C)  SpO2: 94% 98%   Physical Exam: Cardiac:  regular Lungs:  non labored Incisions:  left groin and left AT incisions are intact without swelling or hematoma Extremities:  Doppler left AT/PT signals, left foot warm and well perfused. Doppler signal in left Fem-AT bypass graft. Left 1st toe wound dry and well appearing. No erythema. Painted with Betadine. Left heel wound with healthy appearing tissue. Non adherent dressings reapplied. Very tender. No signs of infection     Abdomen:  soft, non distended Neurologic: alert and oriented  CBC    Component Value Date/Time   WBC 5.2 03/15/2021 0428   RBC 3.93 03/15/2021 0428   HGB 10.8 (L) 03/15/2021 0428   HGB 12.5 08/19/2019 1111   HCT 35.8 (L) 03/15/2021 0428   HCT 39.4 08/19/2019 1111   PLT 348 03/15/2021 0428   PLT 299 08/19/2019 1111   MCV 91.1 03/15/2021 0428   MCV 86 08/19/2019 1111   MCH 27.5 03/15/2021 0428   MCHC 30.2 03/15/2021 0428   RDW 16.8 (H) 03/15/2021 0428   RDW 12.7 08/19/2019 1111   LYMPHSABS 1.3 11/26/2020 1226   MONOABS 0.5 11/26/2020 1226   EOSABS 0.1 11/26/2020 1226   BASOSABS 0.0 11/26/2020 1226    BMET    Component Value Date/Time   NA 133 (L) 03/15/2021 0428   NA 141 09/03/2019 1600   K 4.0 03/15/2021 0428   CL 101 03/15/2021 0428   CO2 22 03/15/2021 0428   GLUCOSE 166 (H) 03/15/2021 0428   BUN 25 (H) 03/15/2021 0428   BUN 24 09/03/2019 1600   CREATININE 2.85 (H) 03/15/2021 0428   CALCIUM 7.5 (L) 03/15/2021 0428   GFRNONAA 17 (L) 03/15/2021 0428   GFRAA 59 (L) 09/03/2019 1600    INR    Component Value Date/Time   INR 1.1 03/09/2021 1030     Intake/Output Summary (Last 24 hours) at 03/16/2021 0714 Last data filed at  03/15/2021 2000 Gross per 24 hour  Intake 490 ml  Output 1502 ml  Net -1012 ml     Assessment/Plan:  70 y.o. female is s/p  Left fem- AT bypass with PTFE and debridement of left great toe and heel  2 Days Post-Op   Hypotensive last evening following HD. BP improved this morning after fluid bolus A little somnolent with Pain medication Having significant burning pain in left foot. Will add Neurontin to try if burning is persistent Afebrile. VSS Left leg well perfused and warm with doppler AT/ PT signals Left leg incisions clean, dry and intact Left 1st toe and heel wounds well appearing. Continue daily dressing changes Continue to offload left heel PT/ OT recommend [place] services. Order placed Mobilize today   Marval Regal [facility] [phone] 03/16/2021 7:14 AM  VASCULAR STAFF ADDENDUM: I have independently interviewed and examined the patient. I agree with the above.  Good technical result from bypass Continue local care to foot. Mobilize as able. Anticipate will need SNF / CIR. Continue HD per nephrology.  Yevonne Aline. Stanford Breed, MD [facility] Phone Number: [phone] 03/16/2021 8:14 AM

## 2021-03-16 NOTE — Progress Notes (Signed)
   03/14/21 1932  Vascular  Vascular (WDL) X  Cyanosis None  Capillary Refill Less than 3 seconds  Pulses R radial;L radial;L dorsalis pedis;R dorsalis pedis;L posterior tibial;R posterior tibial  Edema Right lower extremity;Left lower extremity  RLE Edema Non-pitting  LLE Edema +1  Additional Vascular Assessments First Vascular Site Assessment  First Vascular Site Assessment  #1 - Location of Site Assessment Left femoral  #1 - Vascular Site Assessment Scale Level 0  #1 - Hematoma present? No  #1 - Dressing Type None  #1 - Dressing Status Dry;Intact;Clean  RUE Neurovascular Assessment  R Radial Pulse +2  LUE Neurovascular Assessment  L Radial Pulse +2  RLE Neurovascular Assessment  R Popliteal pulse Doppler patent signals  R Dorsalis Pedis Pulse Doppler good signals  R Posterior Tibial Pulse Doppler weak signals  LLE Neurovascular Assessment  LLE Capillary Refill  Less than/equal to 3 seconds  LLE Color     Appropriate for ethnicity  L popliteal pulse Doppler patent signals  Anterior-Tibial Pulse Left Doppler patent signals  L Dorsalis Pedis Pulse Other (Comment) (UTA, dressing inplace)  L Posterior Tibial Pulse Other (Comment) (UTA, dresing inplace)   Pt is hemodynamically stable. She remains afebrile. She tolerated pain well. No acute distress. Left leg incision dry and clean, no hematoma. Left foot dressing is dry and clean. We will continue to monitor.  Kennyth Lose, RN

## 2021-03-16 NOTE — TOC Initial Note (Signed)
Transition of Care (TOC) - Initial/Assessment Note  Marvetta Gibbons RN, BSN Transitions of Care Unit 4E- RN Case Manager See Treatment Team for direct phone # Marvetta Gibbons RN, BSN Transitions of Care Unit 4E- RN Case Manager See Treatment Team for direct phone #    Patient Details  Name: Molly [name] MRN: [id number] Date of Birth: 01-11-1951  Transition of Care [facility]) CM/SW Contact:    Dawayne Patricia, RN Phone Number: 03/16/2021, 1:54 PM  Clinical Narrative:                 Pt s/p vascular bypass and debridement of great toe and heel. From home w/ spouse. Noted orders for HHPT/OT- pt active with [facility] for HHRN/PT/OT/aide services prior to admit. Spoke with Levada Dy at [facility] and confirmed that they can resume services.  [facility] to follow for timing of discharge.   Expected Discharge Plan: [place] Barriers to Discharge: Continued Medical Work up   Patient Goals and CMS Choice    Resumption of [place] services    Expected Discharge Plan and Services Expected Discharge Plan: [place]   Discharge Planning Services: CM Consult Post Acute Care Choice: Home Health, Resumption of Svcs/PTA Provider Living arrangements for the past 2 months: Single Family Home                           HH Arranged: RN, PT, OT, Nurse's Aide HH Agency: [facility] Date [facility] Agency Contacted: 03/16/21 Time HH Agency Contacted: 84 Representative spoke with at [place]: [place] Arrangements/Services Living arrangements for the past 2 months: [place] with:: Spouse Patient language and need for interpreter reviewed:: Yes        Need for Family Participation in Patient Care: Yes (Comment) Care giver support system in place?: Yes (comment) Current home services: DME Criminal Activity/Legal Involvement Pertinent to Current Situation/Hospitalization: No - Comment as needed  Activities of Daily Living Home Assistive  Devices/Equipment: Wheelchair ADL Screening (condition at time of admission) Patient's cognitive ability adequate to safely complete daily activities?: Yes Is the patient deaf or have difficulty hearing?: No Does the patient have difficulty seeing, even when wearing glasses/contacts?: No Does the patient have difficulty concentrating, remembering, or making decisions?: No Patient able to express need for assistance with ADLs?: Yes Does the patient have difficulty dressing or bathing?: Yes Independently performs ADLs?: No Communication: Independent Dressing (OT): Independent Grooming: Independent Feeding: Independent Bathing: Needs assistance Is this a change from baseline?: Pre-admission baseline Toileting: Needs assistance Is this a change from baseline?: Pre-admission baseline In/Out Bed: Needs assistance Is this a change from baseline?: Pre-admission baseline Walks in Home: Needs assistance Is this a change from baseline?: Pre-admission baseline Does the patient have difficulty walking or climbing stairs?: Yes Weakness of Legs: Both Weakness of Arms/Hands: None  Permission Sought/Granted                  Emotional Assessment       Orientation: : Oriented to Self, Oriented to Place, Oriented to  Time, Oriented to Situation Alcohol / Substance Use: Not Applicable Psych Involvement: No (comment)  Admission diagnosis:  Lower limb ischemia [I99.8] PAD (peripheral artery disease) ([place]) [I73.9] Patient Active Problem List   Diagnosis Date Noted   Lower limb ischemia 03/14/2021   PAD (peripheral artery disease) ([place]) 03/14/2021   Chronic diastolic heart failure ([place]) 02/21/2021   Dependence on  renal dialysis ([place]) 02/21/2021   Major depressive disorder, single episode, unspecified 02/21/2021   Postmenopausal bleeding 02/21/2021   S/p left hip fracture    Hematuria    Fall    Anemia due to end stage renal disease ([place])    Acute postoperative pain of left hip     Abnormal CT of the abdomen    CAP (community acquired pneumonia) 12/27/2020   Anemia 12/27/2020   Dyspnea on exertion 11/26/2020   Acute on chronic respiratory failure with hypoxia ([place]) 11/26/2020   Acute on chronic respiratory failure with hypoxia and hypercapnia ([place]) 11/26/2020   Stage 4 chronic kidney disease ([place]) 11/26/2020   Acute on chronic heart failure with preserved ejection fraction (HFpEF) ([place]) 11/26/2020   Elevated d-dimer 11/26/2020   Prolonged QT interval 11/26/2020   Congestive heart failure (CHF) ([place]) 10/07/2020   Elevated troponin    Chest pain 08/31/2020   Hypertensive emergency 07/12/2020   Type 2 diabetes mellitus with stage 3b chronic kidney disease, with long-term current use of insulin ([place]) 07/12/2020   Hypertension    Diabetes mellitus without complication ([place])    Atherosclerosis of coronary artery without angina pectoris 04/21/2020   Atopic dermatitis 04/21/2020   Goals of care, counseling/discussion 04/21/2020   Diarrhea 04/21/2020   Gastroesophageal reflux disease 04/21/2020   Hyperglycemia due to type 2 diabetes mellitus ([place]) 04/21/2020   Hypertensive heart disease without congestive heart failure 04/21/2020   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[place]) 04/21/2020   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   Left-sided chest pain 04/21/2020   Long term (current) use of insulin ([place]) 04/21/2020   Loss of appetite 04/21/2020   Nausea 04/21/2020   Occlusion and stenosis of bilateral carotid arteries 04/21/2020   Proptosis 04/21/2020   Benign hypertension with CKD (chronic kidney disease) stage III ([place]) 04/21/2020   Epigastric pain 04/21/2020   Standard chest x-ray abnormal 04/21/2020   Vitamin D deficiency 04/21/2020   Weight loss 04/21/2020   Renal artery stenosis ([place]) 12/17/2019   Claudication in peripheral vascular disease ([place]) 08/28/2019   Bilateral impacted cerumen 08/21/2019   Irritable bowel syndrome with diarrhea 10/02/2018   Osteopenia  10/02/2018   Other dysphagia 09/10/2018   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   Proximal leg weakness 09/10/2018   Ptosis of left eyelid 09/10/2018   Dyslipidemia 08/30/2018   Type 2 diabetes mellitus with stage 4 chronic kidney disease, with long-term current use of insulin ([place]) 07/15/2018   Peripheral vascular disease ([place]) 07/15/2018   DKA (diabetic ketoacidoses) 06/29/2015   HTN (hypertension) 06/29/2015   Acute kidney injury ([place]) 06/29/2015   Cholelithiasis 06/29/2015   Abdominal aortic atherosclerosis with stenosis 06/29/2015   Dehydration with hyponatremia 06/29/2015   Leukocytosis 06/29/2015   Cholecystitis 06/29/2015   PCP:  [name], MD Pharmacy:   [id number] [address], [place] [address] Phone: [phone] Fax: [phone]     Social Determinants of Health (SDOH) Interventions    Readmission Risk Interventions No flowsheet data found.

## 2021-03-16 NOTE — Progress Notes (Signed)
[facility] Progress Note  Subjective: pt seen in room, foot pain, no other c/o's  Vitals:   03/16/21 0130 03/16/21 0337 03/16/21 0624 03/16/21 0741  BP: (S) (!) 96/42 (!) 113/43 114/64 140/62  Pulse: (!) 58 61 62 62  Resp: 14 15 18 20   Temp:  98.2 F (36.8 C) 98.2 F (36.8 C) 98 F (36.7 C)  TempSrc:  Oral Oral Oral  SpO2: 96% 94% 98% 100%  Weight:      Height:        Exam:  alert, nad   no jvd  Chest cta bilat  Cor reg no RG  Abd soft ntnd no ascites   Ext no LE edema   Alert, NF, ox3   LU AVF maturing, TDC    Dialysis Orders:  TTS - [place]  4h    400/1.5  59.8kg  3K/2.25 bath  LU AVF maturing/ TDC Hep 2000     Assessment/Plan:  PAD/ischemic ulceration L great toe - SP L fem- AT bypass w/ wound I&D on 11/28 by Dr. Stanford Breed  ESRD -  on HD TTS.  HD tomorrow.   Hypertension/volume  - Blood pressure well controlled.  Does not appear volume overloaded.  UF as tolerated. Up 1.5kg, UF 2-2.5 L w/ HD tomorrow.   Anemia of CKD - Hgb 8.8 post surgery.  Just completed iron infusion as outpatient.  Will order ESA.    Secondary Hyperparathyroidism - Labs in goal as outpatient.  Not on VDRA or binders.   Nutrition - Carb modified diet with fluid restrictions.   DMT2 - per PMD Anxiety - panic attacks with associated desaturations at outpatient dialysis.  Follow closely.     Rob Destany Severns 03/16/2021, 11:16 AM   Recent Labs  Lab 03/14/21 0627 03/14/21 1233 03/15/21 0428  K 3.9  --  4.0  BUN 19  --  25*  CREATININE 2.30* 1.43* 2.85*  CALCIUM  --   --  7.5*  HGB 14.6 8.8* 10.8*    Inpatient medications:  apixaban  5 mg Oral BID   aspirin EC  81 mg Oral Q0600   carvedilol  25 mg Oral BID WC   Chlorhexidine Gluconate Cloth  6 each Topical Q0600   docusate sodium  100 mg Oral Daily   dronabinol  2.5 mg Oral QAC lunch   ezetimibe  10 mg Oral QPM   famotidine  20 mg Oral Daily   FLUoxetine  10 mg Oral Daily   hydrALAZINE  25 mg Oral TID   insulin aspart   0-6 Units Subcutaneous Q4H   isosorbide mononitrate  60 mg Oral Daily   pantoprazole  40 mg Oral Daily   rosuvastatin  10 mg Oral QPM    sodium chloride 999 mL/hr at 03/16/21 0132   sodium chloride 20 mL/hr at 03/14/21 2324   magnesium sulfate bolus IVPB     sodium chloride, acetaminophen **OR** acetaminophen, alum & mag hydroxide-simeth, guaiFENesin-dextromethorphan, hydrALAZINE, HYDROmorphone (DILAUDID) injection, labetalol, loratadine, magnesium sulfate bolus IVPB, metoprolol tartrate, oxyCODONE-acetaminophen, phenol, potassium chloride, senna-docusate

## 2021-03-16 NOTE — Progress Notes (Signed)
We changed the dressing on left foot with wet to dry dressing, and Kirlex and compression wrapped. Pt was tolerated pain well. Minimal bleeding on left heel.  Foley cath was removed. Pt has been oliguria, total urine put put in 24 hours = 60 ml, amber color. Pt will go to hemodialysis soon this am. Her vital signs remains stable. We will monitor.  Kennyth Lose, RN

## 2021-03-16 NOTE — Progress Notes (Signed)
Physical Therapy Treatment Patient Details Name: Molly [name] MRN: [id number] DOB: November 18, 1950 Today's Date: 03/16/2021   History of Present Illness Pt is a 70 y.o. female admitted on 03/14/21 for left femoral -anterior tibial bypass  and debridment of L great toe and heel. PMH includes DM, CHF, HTN, PVD, A-fib, ESRD on HD, osteopenia, proximal leg weakness.    PT Comments    Pt is progressing with mobility. Today's session focused on wheelchair transfers as this is pt's primary mode of mobility. Pt limited by pain in L foot, impaired cognition, generalized weakness, and decreased balance. Would benefit from husband being present at next session to complete transfer training because he is who will be helping her at home. Continue to recommend [facility] PT after discharge to maximize functional mobility and independence. Will continue to follow acutely to address short-term PT goals.    Recommendations for follow up therapy are one component of a multi-disciplinary discharge planning process, led by the attending physician.  Recommendations may be updated based on patient status, additional functional criteria and insurance authorization.  Follow Up Recommendations  Home health PT     Assistance Recommended at Discharge Frequent or constant Supervision/Assistance  Equipment Recommendations  None recommended by PT    Recommendations for Other Services       Precautions / Restrictions Precautions Precautions: Fall     Mobility  Bed Mobility Overal bed mobility: Needs Assistance Bed Mobility: Supine to Sit;Sit to Supine     Supine to sit: HOB elevated;Supervision Sit to supine: Supervision   General bed mobility comments: supervision for safety; cues to use bedrail to help elevate trunk, able to scoot to EOB independently    Transfers Overall transfer level: Needs assistance Equipment used: 2 person hand held assist Transfers: Sit to/from Stand;Bed to chair/wheelchair/BSC Sit to  Stand: Mod assist;+2 physical assistance     Step pivot transfers: Mod assist;+2 physical assistance;From elevated surface     General transfer comment: Transferred 4 times from bed <> w/c with Mod A. Educated pt on technique and which direction to transfer to.    Ambulation/Gait                   Stairs             Wheelchair Mobility    Modified Rankin (Stroke Patients Only)       Balance Overall balance assessment: Needs assistance Sitting-balance support: Feet supported;Single extremity supported Sitting balance-Leahy Scale: Fair Sitting balance - Comments: Able to sit EOB without physical assist   Standing balance support: Bilateral upper extremity supported Standing balance-Leahy Scale: Poor Standing balance comment: reliant on BUE handheld assist to stand and take a few small steps to chair                            Cognition Arousal/Alertness: Awake/alert Behavior During Therapy: WFL for tasks assessed/performed Overall Cognitive Status: Impaired/Different from baseline Area of Impairment: Attention;Memory;Following commands;Safety/judgement;Awareness;Problem solving                   Current Attention Level: Focused Memory: Decreased short-term memory;Decreased recall of precautions Following Commands: Follows one step commands with increased time;Follows one step commands inconsistently Safety/Judgement: Decreased awareness of safety;Decreased awareness of deficits Awareness: Intellectual Problem Solving: Slow processing;Difficulty sequencing;Requires verbal cues;Requires tactile cues General Comments: Needs step by step cueing and reduced environmental distractions during mobility Redirection needed to stay on task. Delayed responses and often does  not finish one thought before moving onto the next.        Exercises      General Comments General comments (skin integrity, edema, etc.): On RA throughout session, unable to  get reliable O2 read but no complaints of SOB. Pt reported she scoots from bed to wheelchair at home with her husband lifting from behind. Discussed with pt about when her husband would be here as we would like to complete transfer training with him.      Pertinent Vitals/Pain Pain Assessment: Faces Faces Pain Scale: Hurts little more Pain Location: L foot Pain Descriptors / Indicators: Grimacing;Guarding;Tender Pain Intervention(s): Limited activity within patient's tolerance;Monitored during session;Repositioned    Home Living                          Prior Function            PT Goals (current goals can now be found in the care plan section) Acute Rehab PT Goals Patient Stated Goal: to return home PT Goal Formulation: With patient Time For Goal Achievement: 03/29/21 Potential to Achieve Goals: Fair Progress towards PT goals: Progressing toward goals    Frequency    Min 3X/week      PT Plan Current plan remains appropriate    Co-evaluation              AM-PAC PT "6 Clicks" Mobility   Outcome Measure  Help needed turning from your back to your side while in a flat bed without using bedrails?: A Little Help needed moving from lying on your back to sitting on the side of a flat bed without using bedrails?: A Little Help needed moving to and from a bed to a chair (including a wheelchair)?: Total Help needed standing up from a chair using your arms (e.g., wheelchair or bedside chair)?: Total Help needed to walk in hospital room?: Total Help needed climbing 3-5 steps with a railing? : Total 6 Click Score: 10    End of Session Equipment Utilized During Treatment: Gait belt Activity Tolerance: Patient tolerated treatment well Patient left: in bed;with bed alarm set;with call bell/phone within reach Nurse Communication: Mobility status PT Visit Diagnosis: Unsteadiness on feet (R26.81);Other abnormalities of gait and mobility (R26.89);Muscle weakness  (generalized) (M62.81);Difficulty in walking, not elsewhere classified (R26.2)     Time: 6160-7371 PT Time Calculation (min) (ACUTE ONLY): 34 min  Charges:  $Therapeutic Activity: 23-37 mins                     Brandon Melnick, SPT   Brandon Melnick 03/16/2021, 3:26 PM

## 2021-03-16 NOTE — Progress Notes (Signed)
Pt has been more drowsy today per RN day shift reported after she came back from hemodialysis. During the physical assessment, Pt was very drowsy, able to follow commands, answered questions correctly, oriented x 3, then fell sleep again. Pt's hemodynamically stable, NSR, HR 58-60. RR 14-20, on room air SPO2 93-98%, afebrile. Her BP 91/38-103/38 mmHg. Hydralazine was held for tonight. Vascular assessment was done. Incision-dry and clean, no bleeding or hematoma. Left foot dressing- dry and clean.   03/15/21 2000  Vascular  Vascular (WDL) X  Cyanosis None  Capillary Refill Less than 3 seconds  Edema Left lower extremity;Right lower extremity;Left upper extremity;Right upper extremity  RUE Edema Non-pitting  LUE Edema Non-pitting  RLE Edema Non-pitting  LLE Edema +1  Additional Vascular Assessments First Vascular Site Assessment  First Vascular Site Assessment  #1 - Location of Site Assessment Left femoral  #1 - Vascular Site Assessment Scale Level 0  #1 - Hematoma present? No  #1 - Dressing Type None  #1 - Dressing Status Clean;Dry;Intact  RUE Neurovascular Assessment  R Radial Pulse +2  LUE Neurovascular Assessment  L Brachial Pulse on AV graft  +3, thrill and bruit  L Ulna pulse +1  L Radial Pulse +1  RLE Neurovascular Assessment  R popliteal Pulse Doppler good signals  R Dorsalis Pedis Pulse Doppler good signals  R Posterior Tibial Pulse Doppler weak/ nearly absent signals  LLE Neurovascular Assessment  LLE Capillary Refill  Less than/equal to 3 seconds  LLE Color  Appropriate for ethnicity, warm and dry  Anterior-Tibial Pulse Left Doppler patent signals  L Popliteal Pulse Doppler patent signals  L Dorsalis Pedis Pulse Other (Comment) (UTA, dressing inplace)  L Posterior Tibial Pulse Other (Comment) (UTA, dressing inplace)   We will continue to monitor.  Kennyth Lose, RN

## 2021-03-17 DIAGNOSIS — I480 Paroxysmal atrial fibrillation: Secondary | ICD-10-CM

## 2021-03-17 DIAGNOSIS — N186 End stage renal disease: Secondary | ICD-10-CM

## 2021-03-17 DIAGNOSIS — Z79899 Other long term (current) drug therapy: Secondary | ICD-10-CM

## 2021-03-17 LAB — RENAL FUNCTION PANEL
Albumin: 2.5 g/dL — ABNORMAL LOW (ref 3.5–5.0)
Anion gap: 9 (ref 5–15)
BUN: 26 mg/dL — ABNORMAL HIGH (ref 8–23)
CO2: 24 mmol/L (ref 22–32)
Calcium: 7.9 mg/dL — ABNORMAL LOW (ref 8.9–10.3)
Chloride: 100 mmol/L (ref 98–111)
Creatinine, Ser: 3.35 mg/dL — ABNORMAL HIGH (ref 0.44–1.00)
GFR, Estimated: 14 mL/min — ABNORMAL LOW (ref >60)
Glucose, Bld: 94 mg/dL (ref 70–99)
Phosphorus: 4.5 mg/dL (ref 2.5–4.6)
Potassium: 3.5 mmol/L (ref 3.5–5.1)
Sodium: 133 mmol/L — ABNORMAL LOW (ref 135–145)

## 2021-03-17 LAB — CBC
HCT: 33.4 % — ABNORMAL LOW (ref 36.0–46.0)
Hemoglobin: 10.3 g/dL — ABNORMAL LOW (ref 12.0–15.0)
MCH: 27.8 pg (ref 26.0–34.0)
MCHC: 30.8 g/dL (ref 30.0–36.0)
MCV: 90.3 fL (ref 80.0–100.0)
Platelets: 276 10*3/uL (ref 150–400)
RBC: 3.7 MIL/uL — ABNORMAL LOW (ref 3.87–5.11)
RDW: 16.6 % — ABNORMAL HIGH (ref 11.5–15.5)
WBC: 5.5 10*3/uL (ref 4.0–10.5)
nRBC: 0 % (ref 0.0–0.2)

## 2021-03-17 LAB — GLUCOSE, CAPILLARY
Glucose-Capillary: 100 mg/dL — ABNORMAL HIGH (ref 70–99)
Glucose-Capillary: 114 mg/dL — ABNORMAL HIGH (ref 70–99)
Glucose-Capillary: 115 mg/dL — ABNORMAL HIGH (ref 70–99)
Glucose-Capillary: 123 mg/dL — ABNORMAL HIGH (ref 70–99)
Glucose-Capillary: 78 mg/dL (ref 70–99)

## 2021-03-17 MED ORDER — HEPARIN SODIUM (PORCINE) 1000 UNIT/ML DIALYSIS: 2000.0000 [IU] | Freq: Once | INTRAMUSCULAR | Status: DC

## 2021-03-17 NOTE — Progress Notes (Addendum)
VASCULAR SURGERY ASSESSMENT & PLAN:   PAD: POD 3 left common femoral to anterior tibial artery bypass with 6 mm externally supported ePTFE in a subcutaneous tunnel and sharp excisional debridement of left great toe and heel (total area 3.5 x 5 cm).  Graft patent with excellent Doppler signals. Continue aspirin and statin. Continue pain control and mobility.  Left heel pain: hopefully with restored perfusion to LLE, this pain will dissipate in next few days. We discussed gabapentin, however this tended to oversedate her when used in the past.  Paroxysmal atrial fibrillation: apixaban has been restarted.   ESRD: HD via right IJ TDC. Will have treatment today. Has new, immature left upper arm AVF.  Dispo: home with HHPT  SUBJECTIVE:   Continues with complaints of left heel pain.  PHYSICAL EXAM:   Vitals:   03/16/21 1647 03/16/21 2014 03/17/21 0046 03/17/21 0352  BP: 123/67 (!) 117/56 (!) 126/53 (!) 149/64  Pulse: 69 60 60 61  Resp: 16 16 15 14   Temp: 98 F (36.7 C) 97.7 F (36.5 C) 97.8 F (36.6 C) 97.9 F (36.6 C)  TempSrc: Oral Oral Oral Oral  SpO2: 95% 96% 100% 100%  Weight:      Height:       General appearance: Awake, alert in no apparent distress Cardiac: Heart rate and rhythm are regular Respirations: Nonlabored Incisions: Left groin and lower leg are all well approximated without bleeding or hematoma. Calf soft. Extremities: Both feet are warm with intact sensation and motor function.  Ischemic changes noted and unchanged. Left heel dressing removed and replaced. Pulse/Doppler exam:  Brisk left dorsalis pedis, posterior tibial and peroneal artery Doppler signals   LABS:   Lab Results  Component Value Date   WBC 5.5 03/17/2021   HGB 10.3 (L) 03/17/2021   HCT 33.4 (L) 03/17/2021   MCV 90.3 03/17/2021   PLT 276 03/17/2021   Lab Results  Component Value Date   CREATININE 3.35 (H) 03/17/2021   Lab Results  Component Value Date   INR 1.1 03/09/2021   CBG  (last 3)  Recent Labs    03/16/21 2009 03/17/21 0049 03/17/21 0453  GLUCAP 124* 114* 100*    PROBLEM LIST:    Principal Problem:   Lower limb ischemia Active Problems:   PAD (peripheral artery disease) (HCC)   CURRENT MEDS:    apixaban  5 mg Oral BID   aspirin EC  81 mg Oral Q0600   carvedilol  25 mg Oral BID WC   Chlorhexidine Gluconate Cloth  6 each Topical Q0600   docusate sodium  100 mg Oral Daily   dronabinol  2.5 mg Oral QAC lunch   ezetimibe  10 mg Oral QPM   famotidine  20 mg Oral Daily   FLUoxetine  10 mg Oral Daily   heparin  2,000 Units Dialysis Once in dialysis   hydrALAZINE  25 mg Oral TID   insulin aspart  0-6 Units Subcutaneous Q4H   isosorbide mononitrate  60 mg Oral Daily   pantoprazole  40 mg Oral Daily   rosuvastatin  10 mg Oral QPM    Barbie Banner, PA-C  Office: [phone] 03/17/2021   VASCULAR STAFF ADDENDUM: I have independently interviewed and examined the patient. I agree with the above.  Still having a bit of pain in the left foot. This appears to be slowly improving. Mobilize as able. PRN pain control. Anticipate DC in AM.   Yevonne Aline. Stanford Breed, MD [facility] Phone Number: [phone] 03/17/2021 9:52 AM

## 2021-03-17 NOTE — Progress Notes (Signed)
Mobility Specialist: Progress Note   03/17/21 1527  Mobility  Activity Stood at bedside  Level of Assistance Moderate assist, patient does 50-74%  Assistive Device Front wheel walker  Mobility  (Stood, unable to take steps)  Mobility Response Tolerated fair  Mobility performed by Mobility specialist  $Mobility charge 1 Mobility   Pre-Mobility: 58 HR Post-Mobility: 65 HR  Pt required minA to sit EOB from supine and modA to stand. Pt c/o 5/10 pain in her LLE and was only able to tolerate a few seconds of standing x2 bouts. Pt back to bed after session with call bell and phone at her side. Bed alarm is on.   [facility] Ravonda Brecheen Mobility Specialist Mobility Specialist Phone #1: [phone] Mobility Specialist Phone #2: [phone]

## 2021-03-17 NOTE — Progress Notes (Signed)
[facility] Progress Note  Subjective: pt seen on Hd, no new c/o  Vitals:   03/17/21 1130 03/17/21 1200 03/17/21 1235 03/17/21 1316  BP: (!) 145/59 (!) 161/62 (!) 172/59 (!) 143/65  Pulse: 60 61 64   Resp: 12 13 14    Temp:   98.3 F (36.8 C) 97.6 F (36.4 C)  TempSrc:   Oral Oral  SpO2: 100% 100% 100% 95%  Weight:   58.4 kg   Height:        Exam:  alert, nad   no jvd  Chest cta bilat  Cor reg no RG  Abd soft ntnd no ascites   Ext no LE edema   Alert, NF, ox3   LU AVF maturing, TDC    Dialysis Orders:  [facility]  4h    400/1.5  59.8kg  3K/2.25 bath  LU AVF maturing/ TDC Hep 2000     Assessment/Plan:  PAD/ischemic ulceration L great toe - SP L fem- AT bypass w/ wound I&D on 11/28 by Dr. Stanford Breed  ESRD -  on HD TTS.  HD today.   Hypertension/volume  - Blood pressure well controlled.  Does not appear volume overloaded.  UF as tolerated. Up 1.5kg, UF 2-2.5 L w/ HD   Anemia of CKD - Hgb 8.8 post surgery.  Just completed iron infusion as outpatient.  Will order ESA.    Secondary Hyperparathyroidism - Labs in goal as outpatient.  Not on VDRA or binders.   Nutrition - Carb modified diet with fluid restrictions.   DMT2 - per PMD Anxiety - panic attacks with associated desaturations at outpatient dialysis.  Follow closely.     Rob Doctor, hospital 03/17/2021, 2:12 PM   Recent Labs  Lab 03/15/21 0428 03/17/21 0658  K 4.0 3.5  BUN 25* 26*  CREATININE 2.85* 3.35*  CALCIUM 7.5* 7.9*  PHOS  --  4.5  HGB 10.8* 10.3*    Inpatient medications:  apixaban  5 mg Oral BID   aspirin EC  81 mg Oral Q0600   carvedilol  25 mg Oral BID WC   Chlorhexidine Gluconate Cloth  6 each Topical Q0600   docusate sodium  100 mg Oral Daily   dronabinol  2.5 mg Oral QAC lunch   ezetimibe  10 mg Oral QPM   famotidine  20 mg Oral Daily   FLUoxetine  10 mg Oral Daily   hydrALAZINE  25 mg Oral TID   insulin aspart  0-6 Units Subcutaneous Q4H   isosorbide mononitrate  60 mg Oral Daily    pantoprazole  40 mg Oral Daily   rosuvastatin  10 mg Oral QPM    sodium chloride 999 mL/hr at 03/16/21 0132   sodium chloride 20 mL/hr at 03/14/21 2324   magnesium sulfate bolus IVPB     sodium chloride, acetaminophen **OR** acetaminophen, alum & mag hydroxide-simeth, guaiFENesin-dextromethorphan, hydrALAZINE, HYDROmorphone (DILAUDID) injection, labetalol, loratadine, magnesium sulfate bolus IVPB, metoprolol tartrate, oxyCODONE-acetaminophen, phenol, potassium chloride, senna-docusate

## 2021-03-17 NOTE — Progress Notes (Addendum)
   03/17/21 1200  OT Visit Information  Last OT Received On 03/17/21  Assistance Needed +2  Reason Eval/Treat Not Completed Patient at procedure or test/ unavailable;Other (comment) (HD)  History of Present Illness Pt is a 70 y.o. female admitted on 03/14/21 for left femoral -anterior tibial bypass  and debridment of L great toe and heel. PMH includes DM, CHF, HTN, PVD, A-fib, ESRD on HD, osteopenia, proximal leg weakness.   Plan to reattempt.  Tyrone Schimke, OT Acute Rehabilitation Services Pager: [phone] Office: [phone]

## 2021-03-17 NOTE — Progress Notes (Signed)
Report given to dialysis RN

## 2021-03-17 NOTE — Care Management Important Message (Signed)
Important Message  Patient Details  Name: Molly [name] MRN: [id number] Date of Birth: [birth year]   Medicare Important Message Given:  Yes     Shelda Altes 03/17/2021, 8:52 AM

## 2021-03-17 NOTE — Progress Notes (Addendum)
Per pt's medical record, pt receives out-pt HD at [facility]. Contacted clinic to confirm pt's days/time. Awaiting response.   Melven Sartorius Renal Navigator [phone]  Addendum at 11:36 am: Pt receives care at [facility] on TTS. Pt arrives at 11:30 for 11:45 chair time.

## 2021-03-18 LAB — GLUCOSE, CAPILLARY
Glucose-Capillary: 111 mg/dL — ABNORMAL HIGH (ref 70–99)
Glucose-Capillary: 129 mg/dL — ABNORMAL HIGH (ref 70–99)

## 2021-03-18 MED ORDER — ASPIRIN 81 MG PO TBEC: 81.0000 mg | DELAYED_RELEASE_TABLET | Freq: Every day | ORAL | 11 refills | Status: AC

## 2021-03-18 MED ORDER — APIXABAN 2.5 MG PO TABS: 2.5000 mg | ORAL_TABLET | Freq: Two times a day (BID) | ORAL | Status: DC

## 2021-03-18 MED ORDER — APIXABAN 2.5 MG PO TABS: 2.5000 mg | ORAL_TABLET | Freq: Two times a day (BID) | ORAL | 0 refills | Status: DC

## 2021-03-18 NOTE — Progress Notes (Signed)
Order received to discharge patient.  Telemetry monitor removed and CCMD notified.  PIV access removed.  Discharge instructions, follow up, medications and instructions for their use discussed with patient and husband.

## 2021-03-18 NOTE — Progress Notes (Signed)
Physical Therapy Treatment Patient Details Name: Molly [name] MRN: [id number] DOB: 1950/05/11 Today's Date: 03/18/2021   History of Present Illness Pt is a 70 y.o. female admitted on 03/14/21 for left femoral -anterior tibial bypass  and debridment of L great toe and heel. PMH includes DM, CHF, HTN, PVD, A-fib, ESRD on HD, osteopenia, proximal leg weakness.   PT Comments    Pt progressing with mobility, preparing for d/c home today. Session focused on transfer training with husband, who is able to demonstrate good technique assisting pt to/from wheelchair. Reviewed education, pt and husband report no further questions or concerns. Continue to recommend HHPT services to maximize functional mobility and independence upon return home.    Recommendations for follow up therapy are one component of a multi-disciplinary discharge planning process, led by the attending physician.  Recommendations may be updated based on patient status, additional functional criteria and insurance authorization.  Follow Up Recommendations  Home health PT     Assistance Recommended at Discharge Frequent or constant Supervision/Assistance  Equipment Recommendations  None recommended by PT    Recommendations for Other Services       Precautions / Restrictions Precautions Precautions: Fall Required Braces or Orthoses: Other Brace Other Brace: L foot flat darco/post-op shoe     Mobility  Bed Mobility Overal bed mobility: Needs Assistance Bed Mobility: Supine to Sit     Supine to sit: Min assist;HOB elevated     General bed mobility comments: MinA for HHA to elevate trunk, pt's husband able to provide necessary assist    Transfers Overall transfer level: Needs assistance Equipment used: 1 person hand held assist Transfers: Sit to/from Stand;Bed to chair/wheelchair/BSC Sit to Stand: Mod assist     Step pivot transfers: Mod assist     General transfer comment: Pt's husband assisted her in standing  and step pivot transfer from bed<>w/c with apparent modA; husband demonstrates good technique and body mechanics    Ambulation/Gait                   Stairs             Wheelchair Mobility    Modified Rankin (Stroke Patients Only)       Balance Overall balance assessment: Needs assistance Sitting-balance support: Feet supported;Single extremity supported Sitting balance-Leahy Scale: Fair     Standing balance support: Bilateral upper extremity supported Standing balance-Leahy Scale: Poor Standing balance comment: reliant on BUE handheld assist to stand and take a few small steps to chair                            Cognition Arousal/Alertness: Awake/alert Behavior During Therapy: WFL for tasks assessed/performed Overall Cognitive Status: Impaired/Different from baseline Area of Impairment: Attention;Following commands;Safety/judgement;Awareness;Problem solving                   Current Attention Level: Sustained;Selective   Following Commands: Follows one step commands consistently Safety/Judgement: Decreased awareness of safety;Decreased awareness of deficits Awareness: Emergent Problem Solving: Difficulty sequencing;Requires verbal cues General Comments: Improved affect and awareness this session, seems excited for d/c home; suspect near baseline cognition        Exercises      General Comments General comments (skin integrity, edema, etc.): Pt preparing for d/c home today - husband present for education regarding transfer techniques and safety at home, both very responsive to discussion; gait belt provided. Pt and husband report no further questions or concerns  Pertinent Vitals/Pain Pain Assessment: Faces Faces Pain Scale: Hurts a little bit Pain Location: L foot Pain Descriptors / Indicators: Guarding;Sore Pain Intervention(s): Monitored during session;Limited activity within patient's tolerance;Patient requesting pain  meds-RN notified    Home Living                          Prior Function            PT Goals (current goals can now be found in the care plan section) Progress towards PT goals: Progressing toward goals    Frequency    Min 3X/week      PT Plan Current plan remains appropriate    Co-evaluation              AM-PAC PT "6 Clicks" Mobility   Outcome Measure  Help needed turning from your back to your side while in a flat bed without using bedrails?: A Little Help needed moving from lying on your back to sitting on the side of a flat bed without using bedrails?: A Little Help needed moving to and from a bed to a chair (including a wheelchair)?: A Lot Help needed standing up from a chair using your arms (e.g., wheelchair or bedside chair)?: A Lot Help needed to walk in hospital room?: Total Help needed climbing 3-5 steps with a railing? : Total 6 Click Score: 12    End of Session   Activity Tolerance: Patient tolerated treatment well Patient left: in bed;with call bell/phone within reach;with family/visitor present Nurse Communication: Mobility status PT Visit Diagnosis: Unsteadiness on feet (R26.81);Other abnormalities of gait and mobility (R26.89);Muscle weakness (generalized) (M62.81);Difficulty in walking, not elsewhere classified (R26.2)     Time: 1517-6160 PT Time Calculation (min) (ACUTE ONLY): 16 min  Charges:  $Therapeutic Activity: 8-22 mins                     Mabeline Caras, PT, DPT Acute Rehabilitation Services  Pager [phone] Office [place] 03/18/2021, 10:57 AM

## 2021-03-18 NOTE — Discharge Summary (Signed)
Discharge Summary     Molly [name] April 23, 1950 70 y.o. female  [id number]  Admission Date: 03/14/2021  Discharge Date: 03/18/2021 Physician: Dr. Darene Lamer. [place]  Admission Diagnosis: Lower limb ischemia [I99.8] PAD (peripheral artery disease) ([place]) [I73.9]  HPI:   This is a 70 y.o. female  with atherosclerosis of native arteries of left lower extremity causing ulceration.  Preoperative angiogram showed occlusion of the distal superficial femoral artery with a "island" of popliteal artery reconstituting before occluding just proximal to the origin of the anterior tibial artery.  The anterior tibial artery fed the foot. After careful discussion of risks, benefits, and alternatives the patient was offered left common femoral to anterior tibial artery bypass and foot debridement. The patient understood and wished to proceed.  Hospital Course:  The patient was admitted to the hospital and taken to the operating room on 03/14/2021 and underwent: 1) left common femoral to anterior tibial artery bypass with 6 mm externally supported ePTFE in a subcutaneous tunnel 2) Sharp excisional debridement of left great toe and heel (total area 3.5 x 5 cm).      Findings: Common femoral artery was more heavily diseased than anticipated, but less than 50% stenosis was appreciated.  The anterior tibial artery was exposed and found to be adequate for bypass.  The 2 exposures were connected with a subcutaneous tunnel and a 6 mm externally supported Gore-Tex graft.  The proximal anastomosis was done under clamp control.  The distal anastomosis was done under tourniquet control.  1 repair stitch was needed at the tibial anastomosis.  Doppler flow was achieved at the anterior tibial artery at the ankle at completion.  Healthy bleeding was noted from foot debridement  The pt tolerated the procedure well and was transported to the PACU in good condition.  Her nephrology team was consulted for management of  hemodialysis.  By POD 1, her vital signs were stable and she underwent inpatient HD without complications. Her apixaban was restarted. She remained in NSR. On POD 2, she develped burning left heel pain. Her heel wound and toe ulcer were stable. Brisk Doppler signals in left foot and graft. Darco shoe ordered and delivered to room. PT/OT evaluations obtained. Her pain was fairly well controlled.  The remainder of the hospital course, POD 3 and 4 consisted of increasing mobilization and increasing intake of solids without difficulty.  CBC    Component Value Date/Time   WBC 5.5 03/17/2021 0658   RBC 3.70 (L) 03/17/2021 0658   HGB 10.3 (L) 03/17/2021 0658   HGB 12.5 08/19/2019 1111   HCT 33.4 (L) 03/17/2021 0658   HCT 39.4 08/19/2019 1111   PLT 276 03/17/2021 0658   PLT 299 08/19/2019 1111   MCV 90.3 03/17/2021 0658   MCV 86 08/19/2019 1111   MCH 27.8 03/17/2021 0658   MCHC 30.8 03/17/2021 0658   RDW 16.6 (H) 03/17/2021 0658   RDW 12.7 08/19/2019 1111   LYMPHSABS 1.3 11/26/2020 1226   MONOABS 0.5 11/26/2020 1226   EOSABS 0.1 11/26/2020 1226   BASOSABS 0.0 11/26/2020 1226    BMET    Component Value Date/Time   NA 133 (L) 03/17/2021 0658   NA 141 09/03/2019 1600   K 3.5 03/17/2021 0658   CL 100 03/17/2021 0658   CO2 24 03/17/2021 0658   GLUCOSE 94 03/17/2021 0658   BUN 26 (H) 03/17/2021 0658   BUN 24 09/03/2019 1600   CREATININE 3.35 (H) 03/17/2021 0658   CALCIUM 7.9 (L) 03/17/2021 5009  GFRNONAA 14 (L) 03/17/2021 0658   GFRAA 59 (L) 09/03/2019 1600     Discharge Instructions     Discharge patient   Complete by: As directed    PT and OT Non adherent dressing to left heal change daily Non weight bearing left heel   Discharge disposition: 06-Home-Health Care Svc   Discharge patient date: 03/18/2021       Discharge Diagnosis:  Lower limb ischemia [I99.8] PAD (peripheral artery disease) ([place]) [I73.9]  Secondary Diagnosis: Patient Active Problem List   Diagnosis  Date Noted   Lower limb ischemia 03/14/2021   PAD (peripheral artery disease) ([place]) 03/14/2021   Chronic diastolic heart failure ([place]) 02/21/2021   Dependence on renal dialysis ([place]) 02/21/2021   Major depressive disorder, single episode, unspecified 02/21/2021   Postmenopausal bleeding 02/21/2021   S/p left hip fracture    Hematuria    Fall    Anemia due to end stage renal disease ([place])    Acute postoperative pain of left hip    Abnormal CT of the abdomen    CAP (community acquired pneumonia) 12/27/2020   Anemia 12/27/2020   Dyspnea on exertion 11/26/2020   Acute on chronic respiratory failure with hypoxia ([place]) 11/26/2020   Acute on chronic respiratory failure with hypoxia and hypercapnia ([place]) 11/26/2020   Stage 4 chronic kidney disease ([place]) 11/26/2020   Acute on chronic heart failure with preserved ejection fraction (HFpEF) ([place]) 11/26/2020   Elevated d-dimer 11/26/2020   Prolonged QT interval 11/26/2020   Congestive heart failure (CHF) ([place]) 10/07/2020   Elevated troponin    Chest pain 08/31/2020   Hypertensive emergency 07/12/2020   Type 2 diabetes mellitus with stage 3b chronic kidney disease, with long-term current use of insulin ([place]) 07/12/2020   Hypertension    Diabetes mellitus without complication ([place])    Atherosclerosis of coronary artery without angina pectoris 04/21/2020   Atopic dermatitis 04/21/2020   Goals of care, counseling/discussion 04/21/2020   Diarrhea 04/21/2020   Gastroesophageal reflux disease 04/21/2020   Hyperglycemia due to type 2 diabetes mellitus ([place]) 04/21/2020   Hypertensive heart disease without congestive heart failure 04/21/2020   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   Left-sided chest pain 04/21/2020   Long term (current) use of insulin ([place]) 04/21/2020   Loss of appetite 04/21/2020   Nausea 04/21/2020   Occlusion and stenosis of bilateral carotid arteries 04/21/2020   Proptosis  04/21/2020   Benign hypertension with CKD (chronic kidney disease) stage III ([place]) 04/21/2020   Epigastric pain 04/21/2020   Standard chest x-ray abnormal 04/21/2020   Vitamin D deficiency 04/21/2020   Weight loss 04/21/2020   Renal artery stenosis ([place]) 12/17/2019   Claudication in peripheral vascular disease ([place]) 08/28/2019   Bilateral impacted cerumen 08/21/2019   Irritable bowel syndrome with diarrhea 10/02/2018   Osteopenia 10/02/2018   Other dysphagia 09/10/2018   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   Proximal leg weakness 09/10/2018   Ptosis of left eyelid 09/10/2018   Dyslipidemia 08/30/2018   Type 2 diabetes mellitus with stage 4 chronic kidney disease, with long-term current use of insulin ([place]) 07/15/2018   Peripheral vascular disease ([place]) 07/15/2018   DKA (diabetic ketoacidoses) 06/29/2015   HTN (hypertension) 06/29/2015   Acute kidney injury ([place]) 06/29/2015   Cholelithiasis 06/29/2015   Abdominal aortic atherosclerosis with stenosis 06/29/2015   Dehydration with hyponatremia 06/29/2015   Leukocytosis 06/29/2015   Cholecystitis 06/29/2015   Past Medical History:  Diagnosis Date  Abdominal aortic atherosclerosis with stenosis 06/29/2015   Abnormal LFTs    Anemia    Atopic dermatitis 04/21/2020   Bilateral impacted cerumen 08/21/2019   Cholecystitis 06/29/2015   Cholelithiasis 06/29/2015   Chronic combined systolic (congestive) and diastolic (congestive) heart failure (HCC)    Claudication in peripheral vascular disease ([place]) 08/28/2019   Peripheral arterial disease   Colon cancer screening 04/21/2020   Dehydration with hyponatremia 06/29/2015   Diabetes mellitus ([place]) 07/15/2018   DKA (diabetic ketoacidoses) 06/29/2015   Dyslipidemia 08/30/2018   Elevated troponin    End stage renal disease (HCC)    Epigastric pain 04/21/2020   Gastroesophageal reflux disease 04/21/2020   HTN (hypertension) 06/29/2015   Hyperglycemia due to type  2 diabetes mellitus ([place]) 04/21/2020   Hypertensive emergency 07/12/2020   Hypertensive heart disease without congestive heart failure 04/21/2020   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   Irritable bowel syndrome with diarrhea 10/02/2018   Leukocytosis 06/29/2015   Long term (current) use of insulin ([place]) 04/21/2020   Loss of appetite 04/21/2020   Nausea 04/21/2020   Occlusion and stenosis of bilateral carotid arteries 04/21/2020   Osteopenia 10/02/2018   Other dysphagia 09/10/2018   Peripheral vascular disease ([place]) 07/15/2018   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   Proptosis 04/21/2020   Proximal leg weakness 09/10/2018   Ptosis of left eyelid 09/10/2018   Renal artery stenosis (HCC) 12/17/2019   Renal artery stenosis   Standard chest x-ray abnormal 04/21/2020   Vitamin D deficiency 04/21/2020   Weight loss 04/21/2020     Allergies as of 03/18/2021       Reactions   Lisinopril Other (See Comments)   Hair fell out   M[place] Hives, Nausea And Vomiting        Medication List     TAKE these medications    acetaminophen 325 MG tablet Commonly known as: TYLENOL Take 2 tablets (650 mg total) by mouth every 4 (four) hours as needed for headache or mild pain.   acetaminophen-codeine 300-15 MG tablet Commonly known as: TYLENOL #2 Take 1 tablet by mouth every 6 (six) hours as needed.   apixaban 2.5 MG Tabs tablet Commonly known as: ELIQUIS Take 1 tablet (2.5 mg total) by mouth 2 (two) times daily. What changed:  medication strength how much to take   aspirin 81 MG EC tablet Take 1 tablet (81 mg total) by mouth daily at 6 (six) AM. Swallow whole. Start taking on: March 19, 2021   carvedilol 25 MG tablet Commonly known as: COREG Take 1 tablet (25 mg total) by mouth 2 (two) times daily.   Cholecalciferol 25 MCG (1000 UT) tablet Take 2,000 Units by mouth every evening.   cilostazol 50 MG  tablet Commonly known as: PLETAL Take 1 tablet (50 mg total) by mouth 2 (two) times daily.   dronabinol 2.5 MG capsule Commonly known as: MARINOL Take 1 capsule (2.5 mg total) by mouth daily before lunch.   ezetimibe 10 MG tablet Commonly known as: ZETIA Take 1 tablet (10 mg total) by mouth daily. What changed: when to take this   famotidine 20 MG tablet Commonly known as: PEPCID Take 20 mg by mouth in the morning and at bedtime.   FLUoxetine 10 MG capsule Commonly known as: PROZAC Take 1 capsule (10 mg total) by mouth daily.   hydrALAZINE 25 MG tablet Commonly known as: APRESOLINE Take 25 mg by mouth 3 (three) times daily.  hydrocortisone cream 1 % Apply 1 application topically 4 (four) times daily as needed for itching.   isosorbide mononitrate 60 MG 24 hr tablet Commonly known as: IMDUR Take 1 tablet (60 mg total) by mouth daily.   ketoconazole 2 % cream Commonly known as: NIZORAL Apply 1 application topically daily as needed for irritation.   levocetirizine 5 MG tablet Commonly known as: XYZAL Take 5 mg by mouth daily as needed for allergies.   loperamide 1 MG/5ML solution Commonly known as: IMODIUM Take 2 mg by mouth as needed for diarrhea or loose stools.   OPTIVE 0.5-0.9 % ophthalmic solution Generic drug: carboxymethylcellul-glycerin Place 1-2 drops into both eyes 3 (three) times daily as needed for dry eyes.   rosuvastatin 10 MG tablet Commonly known as: CRESTOR Take 1 tablet (10 mg total) by mouth daily. What changed: when to take this        Discharge Instructions: [facility] Discharge instructions Lower Extremity Bypass Surgery  Please refer to the following instruction for your post-procedure care. Your surgeon or physician assistant will discuss any changes with you.  Activity  You are encouraged to walk as much as you can. You can slowly return to normal activities during the month after your surgery.  Avoid strenuous activity and heavy lifting until your doctor tells you it's OK. Avoid activities such as vacuuming or swinging a golf club. Do not drive until your doctor give the OK and you are no longer taking prescription pain medications. It is also normal to have difficulty with sleep habits, eating and bowel movement after surgery. These will go away with time.  Bathing/Showering  You may shower after you go home. Do not soak in a bathtub, hot tub, or swim until the incision heals completely.  Incision Care  Clean your incision with mild soap and water. Shower every day. Pat the area dry with a clean towel. You do not need a bandage unless otherwise instructed. Do not apply any ointments or creams to your incision. If you have open wounds you will be instructed how to care for them or a visiting nurse may be arranged for you. If you have staples or sutures along your incision they will be removed at your post-op appointment. You may have skin glue on your incision. Do not peel it off. It will come off on its own in about one week.  Wash the groin wound with soap and water daily and pat dry. (No tub bath-only shower)  Then put a dry gauze or washcloth in the groin to keep this area dry to help prevent wound infection.  Do this daily and as needed.  Do not use Vaseline or neosporin on your incisions.  Only use soap and water on your incisions and then protect and keep dry.  Diet  Resume your normal diet. There are no special food restrictions following this procedure. A low fat/ low cholesterol diet is recommended for all patients with vascular disease. In order to heal from your surgery, it is CRITICAL to get adequate nutrition. Your body requires vitamins, minerals, and protein. Vegetables are the best source of vitamins and minerals. Vegetables also provide the perfect balance of protein. Processed food has little nutritional value, so try to avoid this.  Medications  Resume taking all your  medications unless your doctor or Physician Assistant tells you not to. If your incision is causing pain, you may take over-the-counter pain relievers such as acetaminophen (Tylenol). If you were  prescribed a stronger pain medication, please aware these medication can cause nausea and constipation. Prevent nausea by taking the medication with a snack or meal. Avoid constipation by drinking plenty of fluids and eating foods with high amount of fiber, such as fruits, vegetables, and grains. Take Colace 100 mg (an over-the-counter stool softener) twice a day as needed for constipation.  Do not take Tylenol if you are taking prescription pain medications.  Follow Up  Our office will schedule a follow up appointment 2-3 weeks following discharge.  Please call us immediately for any of the following conditions  Severe or worsening pain in your legs or feet while at rest or while walking Increase pain, redness, warmth, or drainage (pus) from your incision site(s) Fever of 101 degree or higher The swelling in your leg with the bypass suddenly worsens and becomes more painful than when you were in the hospital If you have been instructed to feel your graft pulse then you should do so every day. If you can no longer feel this pulse, call the office immediately. Not all patients are given this instruction.  Leg swelling is common after leg bypass surgery.  The swelling should improve over a few months following surgery. To improve the swelling, you may elevate your legs above the level of your heart while you are sitting or resting. Your surgeon or physician assistant may ask you to apply an ACE wrap or wear compression (TED) stockings to help to reduce swelling.  Reduce your risk of vascular disease  Stop smoking. If you would like help call [facility] at 1-800-QUIT-NOW 270-855-0035) or [place] at 561-284-7385.  Manage your cholesterol Maintain a desired weight Control your diabetes  weight Control your diabetes Keep your blood pressure down  If you have any questions, please call the office at [phone]   Prescriptions given: Patient has active order in place for Tylenol with codeine therefor no other narcotic medication was prescribed.  Disposition: Home  Patient's condition: is Good  Follow up: 1. Dr. Stanford Breed in 2 weeks   Risa Grill, PA-C [facility] [phone] 03/18/2021  1:11 PM  - For VQI Registry use ---   Post-op:  Wound infection: No  Graft infection: No  Transfusion: No    If yes, 0 units given New Arrhythmia: No Ipsilateral amputation: No, [ ]  Minor, [ ]  BKA, [ ]  AKA Discharge patency: [x ] Primary, [ ]  Primary assisted, [ ]  Secondary, [ ]  Occluded Patency judged by: [x ] Dopper only, [ ]  Palpable graft pulse, []  Palpable distal pulse, [ ]  ABI inc. > 0.15, [ ]  Duplex Discharge ABI: R , L  D/C Ambulatory Status: Ambulatory  Complications: MI: No, [ ]  Troponin only, [ ]  EKG or Clinical CHF: No Resp failure:No, [ ]  Pneumonia, [ ]  Ventilator Chg in renal function: No, [ ]  Inc. Cr > 0.5, [ ]  Temp. Dialysis,  [ x] Permanent dialysis (on HD pre-op) Stroke: No, [ ]  Minor, [ ]  Major Return to OR: No  Reason for return to OR: [ ]  Bleeding, [ ]  Infection, [ ]  Thrombosis, [ ]  Revision  Discharge medications: Statin use:  yes ASA use:  yes Plavix use:  no Beta blocker use: yes CCB use:  No ACEI use:   no ARB use:  no Coumadin use: no apixaban

## 2021-03-18 NOTE — Progress Notes (Signed)
Mobility Specialist: Progress Note   03/18/21 1212  Mobility  Activity Transferred:  Bed to chair  Level of Assistance Moderate assist, patient does 50-74%  Assistive Device  (HHA)  Distance Ambulated (ft) 2 ft  Mobility Out of bed to chair with meals  Mobility Response Tolerated fair  Mobility performed by Mobility specialist  $Mobility charge 1 Mobility   Pt assisted to the wheelchair for discharge and assisted to get in her vehicle from wheelchair. Pt c/o pain in her LLE during transfers, no rating given.   [facility] Daiki Dicostanzo Mobility Specialist Mobility Specialist Phone #1: [phone] Mobility Specialist Phone #2: [phone]

## 2021-03-18 NOTE — Progress Notes (Signed)
[facility] Progress Note  Subjective: pt seen in room, going home, very excited  Vitals:   03/17/21 1932 03/17/21 2351 03/18/21 0403 03/18/21 0722  BP: (!) 127/50 (!) 145/52 (!) 149/50 (!) 161/61  Pulse: 62 66 60 60  Resp: 17 18 16 17   Temp: 97.8 F (36.6 C) 97.8 F (36.6 C) 98 F (36.7 C) 97.8 F (36.6 C)  TempSrc: Oral Oral Oral Oral  SpO2: 94% 95% 91% 93%  Weight:   58.4 kg   Height:        Exam:  alert, nad   no jvd  Chest cta bilat  Cor reg no RG  Abd soft ntnd no ascites   Ext no LE edema   Alert, NF, ox3   LU AVF maturing, TDC    Dialysis Orders:  [facility]  4h    400/1.5  59.8kg  3K/2.25 bath  LU AVF maturing/ TDC Hep 2000     Assessment/Plan:  PAD/ischemic ulceration L great toe - SP L fem- AT bypass w/ wound I&D on 11/28 by Dr. Stanford Breed. For dc home today.   ESRD -  on HD TTS. Will go to OP HD tomorrow (Sat).   Hypertension/volume  - Blood pressure well controlled.  Under dry wt , euvolemic. Lower edw to 58.5kg at dc.   Anemia of CKD - Hgb 8.8 post surgery, but then labs improved w/ HD 10's ( no transfusion). No esa given here.  Just completed iron infusion as outpatient.   Secondary Hyperparathyroidism - Labs in goal.   Not on VDRA or binders.   Nutrition - Carb modified diet with fluid restrictions.   DMT2 - per PMD Dispo - for dc home today    Rob Syra Sirmons 03/18/2021, 2:25 PM   Recent Labs  Lab 03/15/21 0428 03/17/21 0658  K 4.0 3.5  BUN 25* 26*  CREATININE 2.85* 3.35*  CALCIUM 7.5* 7.9*  PHOS  --  4.5  HGB 10.8* 10.3*    Inpatient medications:  apixaban  2.5 mg Oral BID   aspirin EC  81 mg Oral Q0600   carvedilol  25 mg Oral BID WC   Chlorhexidine Gluconate Cloth  6 each Topical Q0600   docusate sodium  100 mg Oral Daily   dronabinol  2.5 mg Oral QAC lunch   ezetimibe  10 mg Oral QPM   famotidine  20 mg Oral Daily   FLUoxetine  10 mg Oral Daily   hydrALAZINE  25 mg Oral TID   insulin aspart  0-6 Units Subcutaneous  Q4H   isosorbide mononitrate  60 mg Oral Daily   pantoprazole  40 mg Oral Daily   rosuvastatin  10 mg Oral QPM    sodium chloride 999 mL/hr at 03/16/21 0132   sodium chloride 20 mL/hr at 03/14/21 2324   magnesium sulfate bolus IVPB     sodium chloride, acetaminophen **OR** acetaminophen, alum & mag hydroxide-simeth, guaiFENesin-dextromethorphan, hydrALAZINE, HYDROmorphone (DILAUDID) injection, labetalol, loratadine, magnesium sulfate bolus IVPB, metoprolol tartrate, oxyCODONE-acetaminophen, phenol, potassium chloride, senna-docusate

## 2021-03-18 NOTE — TOC Transition Note (Signed)
Transition of Care (TOC) - CM/SW Discharge Note Marvetta Gibbons RN, BSN Transitions of Care Unit 4E- RN Case Manager See Treatment Team for direct phone #    Patient Details  Name: Molly [name] MRN: [id number] Date of Birth: 12/01/1950  Transition of Care [facility]) CM/SW Contact:  Dawayne Patricia, RN Phone Number: 03/18/2021, 12:22 PM   Clinical Narrative:    Pt stable for transition home today, notified by Cena Benton vascular PA- regarding wound care needs- updated [place] orders to reflect needed wound care- call made to [facility] with [facility] to discuss requested wound care per vascular. [facility] will f/u with patient when they go out to see her. Per bedside RN Danise Mina- pt was also aware of requested wound care.   Spouse transported home.    Final next level of care: [place] Barriers to Discharge: Barriers Resolved   Patient Goals and CMS Choice Patient states their goals for this hospitalization and ongoing recovery are:: return home CMS Medicare.gov Compare Post Acute Care list provided to:: Patient Choice offered to / list presented to : Patient  Discharge Placement               Home w/ [facility]        Discharge Plan and Services   Discharge Planning Services: CM Consult Post Acute Care Choice: Home Health, Resumption of Svcs/PTA Provider                    HH Arranged: RN, PT, OT, Nurse's Aide [place] Agency: [facility] Date [place]: 03/16/21 Time HH Agency Contacted: 1300 Representative spoke with at [place]: [place] ([place]) Interventions     Readmission Risk Interventions No flowsheet data found.

## 2021-03-18 NOTE — Progress Notes (Signed)
Contacted pt's clinic to advise them of pt's d/c today and to resume care tomorrow.   Melven Sartorius Renal Navigator [phone]

## 2021-03-18 NOTE — Progress Notes (Addendum)
VASCULAR SURGERY ASSESSMENT & PLAN:   PAD: POD 4 left common femoral to anterior tibial artery bypass with 6 mm externally supported ePTFE in a subcutaneous tunnel and sharp excisional debridement of left great toe and heel (total area 3.5 x 5 cm).  Graft patent with excellent Doppler signals. Continue aspirin and statin. Continue pain control and mobility.   Left heel pain: hopefully with restored perfusion to LLE, this pain will dissipate in next few days.    Paroxysmal atrial fibrillation: apixaban has been restarted.   ESRD: HD via right IJ TDC. Has new, immature left upper arm AVF. Outpt HS on TTS at [place]: Discharge home with HHPT/OT   SUBJECTIVE:   No complaints this morning  PHYSICAL EXAM:   Vitals:   03/17/21 1726 03/17/21 1932 03/17/21 2351 03/18/21 0403  BP: (!) 124/43 (!) 127/50 (!) 145/52 (!) 149/50  Pulse: (!) 59 62 66 60  Resp: 17 17 18 16   Temp: 97.9 F (36.6 C) 97.8 F (36.6 C) 97.8 F (36.6 C) 98 F (36.7 C)  TempSrc: Oral Oral Oral Oral  SpO2: 96% 94% 95% 91%  Weight:    58.4 kg  Height:       General appearance: Awake, alert in no apparent distress Cardiac: Heart rate and rhythm are regular Respirations: Nonlabored Incisions: Left groin and lower leg are all well approximated without bleeding or hematoma. Calf soft. Extremities: Both feet are warm with intact sensation and motor function.  Ischemic changes noted to left GT and unchanged. Left heel dressing dry and intact.  Pulse/Doppler exam:  Brisk left dorsalis pedis artery Doppler signals   LABS:   Lab Results  Component Value Date   WBC 5.5 03/17/2021   HGB 10.3 (L) 03/17/2021   HCT 33.4 (L) 03/17/2021   MCV 90.3 03/17/2021   PLT 276 03/17/2021   Lab Results  Component Value Date   CREATININE 3.35 (H) 03/17/2021   Lab Results  Component Value Date   INR 1.1 03/09/2021   CBG (last 3)  Recent Labs    03/17/21 1934 03/17/21 2352 03/18/21 0440  GLUCAP 123* 111* 129*     PROBLEM LIST:    Principal Problem:   Lower limb ischemia Active Problems:   PAD (peripheral artery disease) (HCC)   CURRENT MEDS:    apixaban  5 mg Oral BID   aspirin EC  81 mg Oral Q0600   carvedilol  25 mg Oral BID WC   Chlorhexidine Gluconate Cloth  6 each Topical Q0600   docusate sodium  100 mg Oral Daily   dronabinol  2.5 mg Oral QAC lunch   ezetimibe  10 mg Oral QPM   famotidine  20 mg Oral Daily   FLUoxetine  10 mg Oral Daily   hydrALAZINE  25 mg Oral TID   insulin aspart  0-6 Units Subcutaneous Q4H   isosorbide mononitrate  60 mg Oral Daily   pantoprazole  40 mg Oral Daily   rosuvastatin  10 mg Oral QPM   Barbie Banner, PA-C  Office: [phone] 03/18/2021   VASCULAR STAFF ADDENDUM: I have independently interviewed and examined the patient. I agree with the above.  Ready for DC today. Continue LWC to foot - betadine pain, clean dressing changed daily. Follow up with me or VVS PA in 2 weeks with ABI and LLE arterial duplex for wound check.  Yevonne Aline. Stanford Breed, MD [facility] Phone Number: [phone] 03/18/2021 8:11 AM

## 2021-03-19 DIAGNOSIS — Z992 Dependence on renal dialysis: Secondary | ICD-10-CM | POA: Diagnosis not present

## 2021-03-19 DIAGNOSIS — N186 End stage renal disease: Secondary | ICD-10-CM | POA: Diagnosis not present

## 2021-03-19 DIAGNOSIS — N2581 Secondary hyperparathyroidism of renal origin: Secondary | ICD-10-CM | POA: Diagnosis not present

## 2021-03-19 DIAGNOSIS — E1165 Type 2 diabetes mellitus with hyperglycemia: Secondary | ICD-10-CM | POA: Diagnosis not present

## 2021-03-19 DIAGNOSIS — I120 Hypertensive chronic kidney disease with stage 5 chronic kidney disease or end stage renal disease: Secondary | ICD-10-CM | POA: Diagnosis not present

## 2021-03-19 DIAGNOSIS — D631 Anemia in chronic kidney disease: Secondary | ICD-10-CM | POA: Diagnosis not present

## 2021-03-19 DIAGNOSIS — T8249XD Other complication of vascular dialysis catheter, subsequent encounter: Secondary | ICD-10-CM | POA: Diagnosis not present

## 2021-03-19 DIAGNOSIS — E876 Hypokalemia: Secondary | ICD-10-CM | POA: Diagnosis not present

## 2021-03-19 DIAGNOSIS — D509 Iron deficiency anemia, unspecified: Secondary | ICD-10-CM | POA: Diagnosis not present

## 2021-03-19 DIAGNOSIS — R531 Weakness: Secondary | ICD-10-CM | POA: Diagnosis not present

## 2021-03-20 ENCOUNTER — Telehealth (HOSPITAL_COMMUNITY): Payer: Self-pay | Admitting: Nephrology

## 2021-03-20 NOTE — Telephone Encounter (Signed)
Transition of care contact from inpatient facility  Date of Discharge: 12/2 Date of Contact: 03/20/21 - attempted Method of contact: Phone  Attempted to contact patient to discuss transition of care from inpatient admission. Patient did not answer the phone. Message was left on the patient's voicemail with call back number [phone].  Per outpatient HD records, looks like she did NOT attend her HD session on 12/3.  Veneta Penton, PA-C [employer] Pager [phone]

## 2021-03-22 ENCOUNTER — Other Ambulatory Visit: Payer: Self-pay

## 2021-03-22 ENCOUNTER — Telehealth: Payer: Self-pay | Admitting: Pulmonary Disease

## 2021-03-22 DIAGNOSIS — N2581 Secondary hyperparathyroidism of renal origin: Secondary | ICD-10-CM | POA: Diagnosis not present

## 2021-03-22 DIAGNOSIS — E1165 Type 2 diabetes mellitus with hyperglycemia: Secondary | ICD-10-CM | POA: Diagnosis not present

## 2021-03-22 DIAGNOSIS — D631 Anemia in chronic kidney disease: Secondary | ICD-10-CM | POA: Diagnosis not present

## 2021-03-22 DIAGNOSIS — I739 Peripheral vascular disease, unspecified: Secondary | ICD-10-CM

## 2021-03-22 DIAGNOSIS — N186 End stage renal disease: Secondary | ICD-10-CM | POA: Diagnosis not present

## 2021-03-22 DIAGNOSIS — E876 Hypokalemia: Secondary | ICD-10-CM | POA: Diagnosis not present

## 2021-03-22 DIAGNOSIS — Z992 Dependence on renal dialysis: Secondary | ICD-10-CM | POA: Diagnosis not present

## 2021-03-22 DIAGNOSIS — D509 Iron deficiency anemia, unspecified: Secondary | ICD-10-CM | POA: Diagnosis not present

## 2021-03-22 DIAGNOSIS — I120 Hypertensive chronic kidney disease with stage 5 chronic kidney disease or end stage renal disease: Secondary | ICD-10-CM | POA: Diagnosis not present

## 2021-03-22 DIAGNOSIS — T8249XD Other complication of vascular dialysis catheter, subsequent encounter: Secondary | ICD-10-CM | POA: Diagnosis not present

## 2021-03-23 ENCOUNTER — Ambulatory Visit: Payer: Medicare Other

## 2021-03-23 NOTE — Telephone Encounter (Signed)
Spoke to patient, who is requesting HST results from 02/23/2021.  Dr. Silas Flood please advise. Thanks

## 2021-03-24 DIAGNOSIS — D509 Iron deficiency anemia, unspecified: Secondary | ICD-10-CM | POA: Diagnosis not present

## 2021-03-24 DIAGNOSIS — N2581 Secondary hyperparathyroidism of renal origin: Secondary | ICD-10-CM | POA: Diagnosis not present

## 2021-03-24 DIAGNOSIS — E876 Hypokalemia: Secondary | ICD-10-CM | POA: Diagnosis not present

## 2021-03-24 DIAGNOSIS — Z992 Dependence on renal dialysis: Secondary | ICD-10-CM | POA: Diagnosis not present

## 2021-03-24 DIAGNOSIS — D631 Anemia in chronic kidney disease: Secondary | ICD-10-CM | POA: Diagnosis not present

## 2021-03-24 DIAGNOSIS — E1165 Type 2 diabetes mellitus with hyperglycemia: Secondary | ICD-10-CM | POA: Diagnosis not present

## 2021-03-24 DIAGNOSIS — T8249XD Other complication of vascular dialysis catheter, subsequent encounter: Secondary | ICD-10-CM | POA: Diagnosis not present

## 2021-03-24 DIAGNOSIS — N186 End stage renal disease: Secondary | ICD-10-CM | POA: Diagnosis not present

## 2021-03-24 DIAGNOSIS — I120 Hypertensive chronic kidney disease with stage 5 chronic kidney disease or end stage renal disease: Secondary | ICD-10-CM | POA: Diagnosis not present

## 2021-03-25 ENCOUNTER — Ambulatory Visit (HOSPITAL_COMMUNITY)
Admission: RE | Admit: 2021-03-25 | Discharge: 2021-03-25 | Disposition: A | Discharge status: Home / Self Care | Payer: Medicare Other | Source: Ambulatory Visit | Attending: Physician Assistant | Admitting: Physician Assistant

## 2021-03-25 ENCOUNTER — Ambulatory Visit (INDEPENDENT_AMBULATORY_CARE_PROVIDER_SITE_OTHER)
Admission: RE | Admit: 2021-03-25 | Discharge: 2021-03-25 | Disposition: A | Discharge status: Home / Self Care | Payer: Medicare Other | Source: Ambulatory Visit | Attending: Physician Assistant | Admitting: Physician Assistant

## 2021-03-25 ENCOUNTER — Other Ambulatory Visit: Payer: Self-pay

## 2021-03-25 DIAGNOSIS — I739 Peripheral vascular disease, unspecified: Secondary | ICD-10-CM

## 2021-03-25 NOTE — Telephone Encounter (Signed)
Sleep study showed mild sleep apnea. Can we schedule a f/u to discuss - looks like had wanted to see her back late 11/22 based on prior office note.

## 2021-03-25 NOTE — Telephone Encounter (Signed)
Patient is aware of results and voiced her understanding. Appt scheduled 03/28/2021 at 3:15. Nothing further needed at this time.

## 2021-03-26 DIAGNOSIS — N186 End stage renal disease: Secondary | ICD-10-CM | POA: Diagnosis not present

## 2021-03-26 DIAGNOSIS — E876 Hypokalemia: Secondary | ICD-10-CM | POA: Diagnosis not present

## 2021-03-26 DIAGNOSIS — T8249XD Other complication of vascular dialysis catheter, subsequent encounter: Secondary | ICD-10-CM | POA: Diagnosis not present

## 2021-03-26 DIAGNOSIS — Z992 Dependence on renal dialysis: Secondary | ICD-10-CM | POA: Diagnosis not present

## 2021-03-26 DIAGNOSIS — E1165 Type 2 diabetes mellitus with hyperglycemia: Secondary | ICD-10-CM | POA: Diagnosis not present

## 2021-03-26 DIAGNOSIS — I120 Hypertensive chronic kidney disease with stage 5 chronic kidney disease or end stage renal disease: Secondary | ICD-10-CM | POA: Diagnosis not present

## 2021-03-26 DIAGNOSIS — N2581 Secondary hyperparathyroidism of renal origin: Secondary | ICD-10-CM | POA: Diagnosis not present

## 2021-03-26 DIAGNOSIS — D509 Iron deficiency anemia, unspecified: Secondary | ICD-10-CM | POA: Diagnosis not present

## 2021-03-26 DIAGNOSIS — D631 Anemia in chronic kidney disease: Secondary | ICD-10-CM | POA: Diagnosis not present

## 2021-03-28 ENCOUNTER — Encounter: Payer: Self-pay | Admitting: Pulmonary Disease

## 2021-03-28 ENCOUNTER — Other Ambulatory Visit: Payer: Self-pay

## 2021-03-28 ENCOUNTER — Ambulatory Visit (INDEPENDENT_AMBULATORY_CARE_PROVIDER_SITE_OTHER): Payer: Medicare Other | Admitting: Physician Assistant

## 2021-03-28 ENCOUNTER — Ambulatory Visit (INDEPENDENT_AMBULATORY_CARE_PROVIDER_SITE_OTHER): Payer: Medicare Other | Admitting: Pulmonary Disease

## 2021-03-28 VITALS — BP 189/68 | HR 69 | Temp 98.0°F | Resp 20

## 2021-03-28 VITALS — BP 138/64 | HR 60 | Temp 98.0°F | Ht 62.0 in | Wt 128.0 lb

## 2021-03-28 DIAGNOSIS — D631 Anemia in chronic kidney disease: Secondary | ICD-10-CM | POA: Diagnosis not present

## 2021-03-28 DIAGNOSIS — I739 Peripheral vascular disease, unspecified: Secondary | ICD-10-CM

## 2021-03-28 DIAGNOSIS — I051 Rheumatic mitral insufficiency: Secondary | ICD-10-CM | POA: Diagnosis not present

## 2021-03-28 DIAGNOSIS — L89620 Pressure ulcer of left heel, unstageable: Secondary | ICD-10-CM | POA: Diagnosis not present

## 2021-03-28 DIAGNOSIS — I2510 Atherosclerotic heart disease of native coronary artery without angina pectoris: Secondary | ICD-10-CM | POA: Diagnosis not present

## 2021-03-28 DIAGNOSIS — I132 Hypertensive heart and chronic kidney disease with heart failure and with stage 5 chronic kidney disease, or end stage renal disease: Secondary | ICD-10-CM | POA: Diagnosis not present

## 2021-03-28 DIAGNOSIS — Z7982 Long term (current) use of aspirin: Secondary | ICD-10-CM | POA: Diagnosis not present

## 2021-03-28 DIAGNOSIS — I5033 Acute on chronic diastolic (congestive) heart failure: Secondary | ICD-10-CM | POA: Diagnosis not present

## 2021-03-28 DIAGNOSIS — R0609 Other forms of dyspnea: Secondary | ICD-10-CM | POA: Diagnosis not present

## 2021-03-28 DIAGNOSIS — L89890 Pressure ulcer of other site, unstageable: Secondary | ICD-10-CM | POA: Diagnosis not present

## 2021-03-28 DIAGNOSIS — J9622 Acute and chronic respiratory failure with hypercapnia: Secondary | ICD-10-CM | POA: Diagnosis not present

## 2021-03-28 DIAGNOSIS — N186 End stage renal disease: Secondary | ICD-10-CM | POA: Diagnosis not present

## 2021-03-28 DIAGNOSIS — I70202 Unspecified atherosclerosis of native arteries of extremities, left leg: Secondary | ICD-10-CM | POA: Diagnosis not present

## 2021-03-28 DIAGNOSIS — G4733 Obstructive sleep apnea (adult) (pediatric): Secondary | ICD-10-CM

## 2021-03-28 DIAGNOSIS — E785 Hyperlipidemia, unspecified: Secondary | ICD-10-CM | POA: Diagnosis not present

## 2021-03-28 DIAGNOSIS — K589 Irritable bowel syndrome without diarrhea: Secondary | ICD-10-CM | POA: Diagnosis not present

## 2021-03-28 DIAGNOSIS — L89893 Pressure ulcer of other site, stage 3: Secondary | ICD-10-CM | POA: Diagnosis not present

## 2021-03-28 DIAGNOSIS — H6123 Impacted cerumen, bilateral: Secondary | ICD-10-CM | POA: Diagnosis not present

## 2021-03-28 DIAGNOSIS — E1151 Type 2 diabetes mellitus with diabetic peripheral angiopathy without gangrene: Secondary | ICD-10-CM | POA: Diagnosis not present

## 2021-03-28 DIAGNOSIS — J9621 Acute and chronic respiratory failure with hypoxia: Secondary | ICD-10-CM | POA: Diagnosis not present

## 2021-03-28 DIAGNOSIS — R1319 Other dysphagia: Secondary | ICD-10-CM | POA: Diagnosis not present

## 2021-03-28 DIAGNOSIS — Z992 Dependence on renal dialysis: Secondary | ICD-10-CM

## 2021-03-28 DIAGNOSIS — I6523 Occlusion and stenosis of bilateral carotid arteries: Secondary | ICD-10-CM | POA: Diagnosis not present

## 2021-03-28 DIAGNOSIS — E1122 Type 2 diabetes mellitus with diabetic chronic kidney disease: Secondary | ICD-10-CM | POA: Diagnosis not present

## 2021-03-28 DIAGNOSIS — M8580 Other specified disorders of bone density and structure, unspecified site: Secondary | ICD-10-CM | POA: Diagnosis not present

## 2021-03-28 DIAGNOSIS — Z7901 Long term (current) use of anticoagulants: Secondary | ICD-10-CM | POA: Diagnosis not present

## 2021-03-28 DIAGNOSIS — K219 Gastro-esophageal reflux disease without esophagitis: Secondary | ICD-10-CM | POA: Diagnosis not present

## 2021-03-28 DIAGNOSIS — I701 Atherosclerosis of renal artery: Secondary | ICD-10-CM | POA: Diagnosis not present

## 2021-03-28 NOTE — Progress Notes (Signed)
POST OPERATIVE OFFICE NOTE    CC:  F/u for surgery  HPI:  This is a 70 y.o. female who is s/p left common femoral to anterior tibial artery bypass with PTFE. She additionally had debridement of left great toe and heel by Dr. Stanford Breed on 03/14/21. She did well post operatively and was discharged home on POD#4.   She reports since being home that she has been having some pains in left heel ulcer mostly at night time. Very minimal pain in left 1st toe. Otherwise she says her left groin and leg incisions are sore but not really bothersome. She has been ambulating and trying to not put any pressure on her heel. Her husband has been helping with dressing changes to her heel and toe daily.  She dialyzes via right IJ TDC on Tues/ Thurs/ Sat at the [facility] location. Her left AV fistula on her duplex was patent but not fully matured and appeared deep in left upper arm. She has been working to exercise it as recommended  She is on Statin medication for hyperlipidemia. She also takes Zetia She is on Aspirin and Eliquis Allergies  Allergen Reactions   Lisinopril Other (See Comments)    Hair fell out   Mercury Hives and Nausea And Vomiting    Current Outpatient Medications  Medication Sig Dispense Refill   acetaminophen (TYLENOL) 325 MG tablet Take 2 tablets (650 mg total) by mouth every 4 (four) hours as needed for headache or mild pain.     acetaminophen-codeine (TYLENOL #2) 300-15 MG tablet Take 1 tablet by mouth every 6 (six) hours as needed.     apixaban (ELIQUIS) 2.5 MG TABS tablet Take 1 tablet (2.5 mg total) by mouth 2 (two) times daily. 60 tablet 0   aspirin EC 81 MG EC tablet Take 1 tablet (81 mg total) by mouth daily at 6 (six) AM. Swallow whole. 30 tablet 11   carboxymethylcellul-glycerin (OPTIVE) 0.5-0.9 % ophthalmic solution Place 1-2 drops into both eyes 3 (three) times daily as needed for dry eyes.     carvedilol (COREG) 25 MG tablet Take 1 tablet (25 mg total) by mouth 2 (two)  times daily. 180 tablet 3   Cholecalciferol 25 MCG (1000 UT) tablet Take 2,000 Units by mouth every evening.     cilostazol (PLETAL) 50 MG tablet Take 1 tablet (50 mg total) by mouth 2 (two) times daily. 180 tablet 3   dronabinol (MARINOL) 2.5 MG capsule Take 1 capsule (2.5 mg total) by mouth daily before lunch. 30 capsule 0   ezetimibe (ZETIA) 10 MG tablet Take 1 tablet (10 mg total) by mouth daily. (Patient taking differently: Take 10 mg by mouth every evening.) 90 tablet 2   famotidine (PEPCID) 20 MG tablet Take 20 mg by mouth in the morning and at bedtime.     FLUoxetine (PROZAC) 10 MG capsule Take 1 capsule (10 mg total) by mouth daily. 30 capsule 0   hydrALAZINE (APRESOLINE) 25 MG tablet Take 25 mg by mouth 3 (three) times daily.     hydrocortisone cream 1 % Apply 1 application topically 4 (four) times daily as needed for itching.     isosorbide mononitrate (IMDUR) 60 MG 24 hr tablet Take 1 tablet (60 mg total) by mouth daily. 90 tablet 1   ketoconazole (NIZORAL) 2 % cream Apply 1 application topically daily as needed for irritation.      levocetirizine (XYZAL) 5 MG tablet Take 5 mg by mouth daily as needed for allergies.  loperamide (IMODIUM) 1 MG/5ML solution Take 2 mg by mouth as needed for diarrhea or loose stools.     rosuvastatin (CRESTOR) 10 MG tablet Take 1 tablet (10 mg total) by mouth daily. (Patient taking differently: Take 10 mg by mouth every evening.) 30 tablet 0   No current facility-administered medications for this visit.     ROS:  See HPI  Physical Exam:  Vitals:   03/28/21 1030  BP: (!) 189/68  Pulse: 69  Resp: 20  Temp: 98 F (36.7 C)  TempSrc: Temporal  SpO2: 96%   General: well appearing, well nourished, in no acute distress Cardiac: regular rate and rhythm Lungs:non labored Incision:  left groin incision healing well, Dermabond skin adhesive still present. Left anterior leg incision well appearing, healing nicely. Left 1st toe wound well appearing,  dry. Left heel with some slough in wound bed but wound appears more superficial. Mepilex dressing applied to heel. Betadine and 2 x 2 gauze applied to left 1st toe     Extremities:  2+ femoral pulse, palpable pulse in LLE bypass graft Neuro: alert and oriented   Non invasive vascular lab: 03/25/21  Left Graft #1:  +--------------------+--------+---------------+----------+--------+                      PSV cm/sStenosis       Waveform  Comments  +--------------------+--------+---------------+----------+--------+  Inflow              236     50-74% stenosismonophasicNWV       +--------------------+--------+---------------+----------+--------+  Proximal Anastomosis252                    monophasic          +--------------------+--------+---------------+----------+--------+  Proximal Graft      90                     monophasic          +--------------------+--------+---------------+----------+--------+  Mid Graft           74                     monophasic          +--------------------+--------+---------------+----------+--------+  Distal Graft        60                     monophasic          +--------------------+--------+---------------+----------+--------+  Distal Anastomosis  63                     monophasic          +--------------------+--------+---------------+----------+--------+  Outflow             164                    monophasic          +--------------------+--------+---------------+----------+--------+   Summary:  Left: Patent left femoral to anterior tibial artery bypass graft.  +-------+-----------+-----------+------------+------------+  ABI/TBIToday's ABIToday's TBIPrevious ABIPrevious TBI  +-------+-----------+-----------+------------+------------+  Right  0.62       0.30                 0.51          +-------+-----------+-----------+------------+------------+  Left   0.91       Bandage    0.51         absent        +-------+-----------+-----------+------------+------------+  Assessment/Plan:  This is a 70 y.o. female who is s/p left common femoral to anterior tibial artery bypass with PTFE and debridement of left great toe and heel by Dr. Stanford Breed on 03/14/21. She has done well post operatively. Her left heel and left 1st toe are healing. Her bypass has palpable pulse in it.  - Her duplex shows patent LLE bypass with some elevated velocities proximally. Will need to continue to monitor this going forward - Her Left ABI is greatly improved post surgery. Her right ABI is essentially unchanged with decrease in the right TBI. 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 She will need a superficialization but think it is reasonable to wait until she has recovered more from her recent LLE bypass surgery. I discussed with patient and her husband that we will re discuss surgery and timing when she comes back for her wound check in 3-4 weeks - She will call for earlier follow up if new or concerning symptoms - Follow up in 3-4 weeks for left lower extremity wound check  Karoline Caldwell, PA-C [facility] [phone]  Clinic MD:  Joycelyn Das

## 2021-03-28 NOTE — Patient Instructions (Signed)
Nice to see you again  Level sleep apnea is mild, I recommend starting CPAP as soon as we can to help minimize fluid retention.  I will discuss with my colleagues and let you know the next best route to determine the appropriate CPAP and oxygen settings needed.  I like will be some delay in obtaining the equipment once we decide the next steps but we will get it for you soon as we can.  Return to clinic in 3 months or sooner as needed

## 2021-03-28 NOTE — Progress Notes (Signed)
@Patient  ID: Molly [name], female    DOB: 04-07-1951, 70 y.o.   MRN: [id number]  Chief Complaint  Patient presents with   Follow-up    Follow up to go over results from the home sleep study     Referring provider: [name],*  HPI:   70 y.o. whom we are seeing in follow-up of DOE and recent diagnosis mild OSA.  Discharge summary 02/13/2021 no sign of volume overload and discharge summary 03/18/2021 after femoral bypass both reviewed.  Doing okay.  Recent femoral bypass went well.  Ulcer on foot improving.  Overall she is doing okay.  HD going well.  Says they are pleased with the center with volume removal, other labs.  No real complaints today.  Main reason for visit was to review recent polysomnography.  AHI of 8, mild.  Discussed treatment options.  Have her keep her CPAP given her ESRD status as I think would give [place] best chance to adequately control potential volume complications of untreated or undertreated sleep apnea.  Notably, she also had nocturnal desaturation/hypoxemia independent of apnea events.   HPI at initial visit: Presented to ED 08/31/20 with chest pain. Noted to hypertensive, desaturation to 69%. Placed on [place]. Marland Kitchen Noted to have CKD. Worsened and required NIPPV. Diuresed. BP controlled with oral agents. LHC deferred given CKD. TTE reviewed with low normal EF 50%, G1 ddfxn, RV size and function ok. CXR on admission reviewed with left sided infiltrate on my interpreation. VQ scan low risk PE on review. CT chest w/o contrast with patchy lingula and LLL infiltrates. No abx. Improved. Discharged on 2L [place]. PCCM was not consulted.   She has DOE. Present on flat surfaces, worse on staris/inclines. Using 2L [place]. Not checking O2 saturations. Thinks symptoms have improved. Feels weak but slowly getting stronger. No breathing issues prior to admission.   PMH: HTN, Ddfxn Family History: HTN breast cancer in mom, HTN in dad Surgical history: gall bladder, cataract Social  History: never smoker, lives on [address] [place] / Pulmonary Flowsheets:   ACT:  No flowsheet data found.  MMRC: No flowsheet data found.  Epworth:  No flowsheet data found.  Tests:   FENO:  No results found for: NITRICOXIDE  PFT: PFT Results Latest Ref Rng & Units 10/15/2020  FVC-Pre L 1.16  FVC-Predicted Pre % 52  FVC-Post L 1.28  FVC-Predicted Post % 58  Pre FEV1/FVC % % 82  Post FEV1/FCV % % 84  FEV1-Pre L 0.95  FEV1-Predicted Pre % 55  FEV1-Post L 1.08  DLCO uncorrected ml/min/mmHg 7.67  DLCO UNC% % 41  DLCO corrected ml/min/mmHg 8.23  DLCO COR %Predicted % 44  DLVA Predicted % 73  TLC L 6.23  TLC % Predicted % 131  RV % Predicted % 240  Personally viewed interpreted as spirometry suggestive of gas trapping versus restriction.  TLC reveals hyperinflation, gas trapping present on lung volumes.  DLCO severely reduced.  WALK:  SIX MIN WALK 10/08/2020  Supplimental Oxygen during Test? 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 Donzetta Matters, MD Procedure Performed: 1.  Ultrasound-guided cannulation right common femoral artery  2.  Aortogram with bilateral lower extremity runoff 3.  Selection of left common femoral artery for spot views left foot 4.  Moderate sedation with fentanyl and Versed for 36 minutes 5.  Mynx device closure right common femoral artery Indications: 70 year old female now with a pressure ulcer on her left heel and toe ulceration on the left great toe.  She has depressed ABIs with end-stage renal disease.  She is indicated for  angiography from right common femoral approach and possible intervention. Findings: The aorta and iliac segments are calcified however there is no flow-limiting stenosis.  Renal arteries are diminutive did not appear to fill her endorgan's consistent with dialysis.  Hypogastrics are both patent common femoral arteries are calcified but patent.  Right side the SFA is heavily calcified and diseased and frankly occludes for a long segment reconstitutes above the knee popliteal and then there appears to be very heavily calcified diseased tibials that do not fill past the mid calf on the right.  On the left side the SFA is diminutive after the takeoff it then frankly occludes for the long segment reconstitutes above the knee is a popliteal island.  This then occludes below the knee the anterior tibial reconstitutes after about 5 cm this then goes down to the level of the ankle and gives rise to the peroneal and posterior tibial artery but given the difficulty and delayed filling of contrast difficult to see the contrast filling the foot itself. Plan will be for left femoral to anterior tibial artery bypass.  Procedure:  The patient was identified in the holding area and taken to room 8.  The patient was then placed supine on the table and prepped and draped in the usual sterile fashion.  A time out was called.  Ultrasound was used to evaluate the right common femoral artery.  This was calcified somewhat diseased although patent for sheath placement.  There is anesthetized 1% lidocaine.  Moderate sedation was administered with fentanyl and Versed her vital signs were monitored throughout the case.  We cannulated the common femoral artery with micropuncture needle followed by wire and sheath with direct ultrasound visualization and images saved the permanent record.  We placed a Bentson wire followed by 5 [place] sheath.  Omni catheter was placed to level of L1 aortogram was performed followed by bilateral extremity runoff.   I then crossed the bifurcation this required Omni catheter and Bentson wire followed by straight catheter to the level to common femoral artery and perform spot views of the left lower extremity.  With above findings I remove the catheter.  I performed retrograde angiography from the right sheath and then placed a minx device for closure.  She tolerated procedure without any complication. Contrast: 150 cc Brandon C. Donzetta Matters, MD [facility] Office: [phone] Pager: [phone]  VAS [place] ABI WITH/WO TBI  Result Date: 03/25/2021  LOWER EXTREMITY DOPPLER STUDY Patient Name:  LAMYIA CDEBACA  Date of Exam:   03/25/2021 Medical Rec #: [id number]     Accession #:    [id number] Date of Birth: [birth year]     Patient Gender: F Patient Age:   45 years Exam Location:  [facility] Procedure:      VAS [place] ABI WITH/WO TBI Referring Phys: Servando Snare --------------------------------------------------------------------------------  Indications: Ulceration left heal and great toe. High Risk Factors: Hypertension, hyperlipidemia, no history of smoking.  Vascular Interventions: Left femoral to anterior tibial bypass graft  03/14/2021. Comparison Study: Increased left ABI post op Performing Technologist: Alvia Grove RVT  Examination Guidelines: A complete evaluation includes at minimum, Doppler waveform signals and systolic blood pressure reading at the level of bilateral brachial, anterior tibial, and posterior tibial arteries, when vessel segments are accessible. Bilateral testing is considered an integral part of a complete examination. Photoelectric Plethysmograph (PPG) waveforms and toe systolic pressure readings are included as required and additional duplex testing as needed. Limited examinations for reoccurring indications may be performed as noted.  ABI Findings: +---------+------------------+-----+----------+--------+ Right    Rt Pressure  (mmHg)IndexWaveform  Comment  +---------+------------------+-----+----------+--------+ Brachial 189                                       +---------+------------------+-----+----------+--------+ PTA      126               0.62 monophasic         +---------+------------------+-----+----------+--------+ DP       82                0.41 monophasic         +---------+------------------+-----+----------+--------+ Great Toe61                0.30 Abnormal           +---------+------------------+-----+----------+--------+ +---------+------------------+-----+----------+------------+ Left     Lt Pressure (mmHg)IndexWaveform  Comment      +---------+------------------+-----+----------+------------+ Brachial 202                                           +---------+------------------+-----+----------+------------+ PTA      182               0.90 monophasic             +---------+------------------+-----+----------+------------+ DP       184               0.91 monophasic             +---------+------------------+-----+----------+------------+ Great Toe                                 Bandaged toe +---------+------------------+-----+----------+------------+ +-------+-----------+-----------+------------+------------+ ABI/TBIToday's ABIToday's TBIPrevious ABIPrevious TBI +-------+-----------+-----------+------------+------------+ Right  0.62       0.30       [place]          0.51         +-------+-----------+-----------+------------+------------+ Left   0.91       Bandage    0.51        absent       +-------+-----------+-----------+------------+------------+   Summary: Right: Resting right ankle-brachial index indicates moderate right lower extremity arterial disease. The right toe-brachial index is abnormal. Left: Resting left ankle-brachial index indicates mild left lower extremity arterial disease. The left toe-brachial index is abnormal.  *See table(s) above  for measurements and observations.  Electronically signed by Orlie Pollen on 03/25/2021 at 5:57:34 PM.    Final    VAS [place] ABI WITH/WO TBI  Result Date: 03/01/2021  LOWER EXTREMITY DOPPLER STUDY Patient Name:  MATA ROWEN  Date of Exam:   03/01/2021 Medical Rec #: [id number]     Accession #:    [id number] Date of Birth: [birth year]  Patient Gender: F Patient Age:   31 years Exam Location:  [facility] Procedure:      VAS [place] ABI WITH/WO TBI Referring Phys: Jamelle Haring --------------------------------------------------------------------------------  Indications: Ulceration left heel and great toe. High Risk Factors: Hypertension, Diabetes, no history of smoking.  Performing Technologist: Alvia Grove RVT  Examination Guidelines: A complete evaluation includes at minimum, Doppler waveform signals and systolic blood pressure reading at the level of bilateral brachial, anterior tibial, and posterior tibial arteries, when vessel segments are accessible. Bilateral testing is considered an integral part of a complete examination. Photoelectric Plethysmograph (PPG) waveforms and toe systolic pressure readings are included as required and additional duplex testing as needed. Limited examinations for reoccurring indications may be performed as noted.  ABI Findings: +---------+------------------+-----+----------+--------+ Right    Rt Pressure (mmHg)IndexWaveform  Comment  +---------+------------------+-----+----------+--------+ Brachial 220                                       +---------+------------------+-----+----------+--------+ PTA      >254              1.10 monophasic         +---------+------------------+-----+----------+--------+ PERO                                               +---------+------------------+-----+----------+--------+ DP       101               0.44 monophasic         +---------+------------------+-----+----------+--------+ [place]                0.51                    +---------+------------------+-----+----------+--------+ +--------+------------------+-----+----------+-------+ Left    Lt Pressure (mmHg)IndexWaveform  Comment +--------+------------------+-----+----------+-------+ [id number]                                      +--------+------------------+-----+----------+-------+ PTA     118               0.51 monophasic        +--------+------------------+-----+----------+-------+ PERO    67                     monophasic        +--------+------------------+-----+----------+-------+ +-------+-----------+-----------+------------+------------+ ABI/TBIToday's ABIToday's TBIPrevious ABIPrevious TBI +-------+-----------+-----------+------------+------------+ Right  [place]         0.51                                +-------+-----------+-----------+------------+------------+ Left   0.51       absent                              +-------+-----------+-----------+------------+------------+   Summary: Right: Resting right ankle-brachial index indicates noncompressible right lower extremity arteries. The right toe-brachial index is abnormal. Left: Resting left ankle-brachial index indicates moderate left lower extremity arterial disease. Unable to obtain left toe-brachial PPG waveform.  *See table(s) above for measurements and observations.  Electronically signed by Jamelle Haring on 03/01/2021 at 5:00:32 PM.  Final    VAS [place] LOWER EXTREMITY ARTERIAL DUPLEX  Result Date: 03/25/2021 LOWER EXTREMITY ARTERIAL DUPLEX STUDY Patient Name:  AMAURIA YOUNTS  Date of Exam:   03/25/2021 Medical Rec #: [id number]     Accession #:    [id number] Date of Birth: [birth year]     Patient Gender: F Patient Age:   63 years Exam Location:  [facility] Procedure:      VAS [place] LOWER EXTREMITY ARTERIAL DUPLEX Referring Phys: Servando Snare --------------------------------------------------------------------------------   Indications: Ulceration left heel and great toe. High Risk Factors: Hypertension, Diabetes, coronary artery disease. Renal artery                    stenosis, CHF  Vascular Interventions: Left femoral to anterior tibial bypass graft                         03/14/2021. Current ABI:            Right ABI 0.62 Left 0.91 Performing Technologist: Alvia Grove RVT  Examination Guidelines: A complete evaluation includes B-mode imaging, spectral Doppler, color Doppler, and power Doppler as needed of all accessible portions of each vessel. Bilateral testing is considered an integral part of a complete examination. Limited examinations for reoccurring indications may be performed as noted.   Left Graft #1: +--------------------+--------+---------------+----------+--------+                     PSV cm/sStenosis       Waveform  Comments +--------------------+--------+---------------+----------+--------+ Inflow              236     50-74% stenosismonophasicNWV      +--------------------+--------+---------------+----------+--------+ Proximal Anastomosis252                    monophasic         +--------------------+--------+---------------+----------+--------+ Proximal Graft      90                     monophasic         +--------------------+--------+---------------+----------+--------+ Mid Graft           74                     monophasic         +--------------------+--------+---------------+----------+--------+ Distal Graft        60                     monophasic         +--------------------+--------+---------------+----------+--------+ Distal Anastomosis  63                     monophasic         +--------------------+--------+---------------+----------+--------+ Outflow             164                    monophasic         +--------------------+--------+---------------+----------+--------+   Summary: Left: Patent left femoral to anterior tibial artery bypass graft.  See  table(s) above for measurements and observations. Electronically signed by Orlie Pollen on 03/25/2021 at 5:31:54 PM.    Final     Lab Results: Personally reviewed CBC    Component Value Date/Time   WBC 5.5 03/17/2021 0658   RBC 3.70 (L) 03/17/2021 0658   HGB 10.3 (L) 03/17/2021 0658   HGB 12.5  08/19/2019 1111   HCT 33.4 (L) 03/17/2021 0658   HCT 39.4 08/19/2019 1111   PLT 276 03/17/2021 0658   PLT 299 08/19/2019 1111   MCV 90.3 03/17/2021 0658   MCV 86 08/19/2019 1111   MCH 27.8 03/17/2021 0658   MCHC 30.8 03/17/2021 0658   RDW 16.6 (H) 03/17/2021 0658   RDW 12.7 08/19/2019 1111   LYMPHSABS 1.3 11/26/2020 1226   MONOABS 0.5 11/26/2020 1226   EOSABS 0.1 11/26/2020 1226   BASOSABS 0.0 11/26/2020 1226    BMET    Component Value Date/Time   NA 133 (L) 03/17/2021 0658   NA 141 09/03/2019 1600   K 3.5 03/17/2021 0658   CL 100 03/17/2021 0658   CO2 24 03/17/2021 0658   GLUCOSE 94 03/17/2021 0658   BUN 26 (H) 03/17/2021 0658   BUN 24 09/03/2019 1600   CREATININE 3.35 (H) 03/17/2021 0658   CALCIUM 7.9 (L) 03/17/2021 0658   GFRNONAA 14 (L) 03/17/2021 0658   GFRAA 59 (L) 09/03/2019 1600    BNP    Component Value Date/Time   BNP 1,102.1 (H) 12/27/2020 0835    ProBNP No results found for: PROBNP  Specialty Problems       Pulmonary Problems   Acute on chronic respiratory failure with hypoxia (HCC)   Acute on chronic respiratory failure with hypoxia and hypercapnia (HCC)   Dyspnea on exertion   CAP (community acquired pneumonia)    Allergies  Allergen Reactions   Lisinopril Other (See Comments)    Hair fell out   Mercury Hives and Nausea And Vomiting    Immunization History  Administered Date(s) Administered   Influenza,inj,Quad PF,6+ Mos 06/13/2017   Influenza-Unspecified 01/30/2018, 01/29/2019, 02/05/2020, 02/16/2021   MMR 09/02/2008   PFIZER(Purple Top)SARS-COV-2 Vaccination 06/08/2019, 07/02/2019   Pneumococcal Conjugate-13 10/15/2017   Td 08/31/2008     Past Medical History:  Diagnosis Date   Abdominal aortic atherosclerosis with stenosis 06/29/2015   Abnormal LFTs    Anemia    Atopic dermatitis 04/21/2020   Bilateral impacted cerumen 08/21/2019   Cholecystitis 06/29/2015   Cholelithiasis 06/29/2015   Chronic combined systolic (congestive) and diastolic (congestive) heart failure (HCC)    Claudication in peripheral vascular disease ([place]) 08/28/2019   Peripheral arterial disease   Colon cancer screening 04/21/2020   Dehydration with hyponatremia 06/29/2015   Diabetes mellitus ([place]) 07/15/2018   DKA (diabetic ketoacidoses) 06/29/2015   Dyslipidemia 08/30/2018   Elevated troponin    End stage renal disease (HCC)    Epigastric pain 04/21/2020   Gastroesophageal reflux disease 04/21/2020   HTN (hypertension) 06/29/2015   Hyperglycemia due to type 2 diabetes mellitus ([place]) 04/21/2020   Hypertensive emergency 07/12/2020   Hypertensive heart disease without congestive heart failure 04/21/2020   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   Irritable bowel syndrome with diarrhea 10/02/2018   Leukocytosis 06/29/2015   Long term (current) use of insulin ([place]) 04/21/2020   Loss of appetite 04/21/2020   Nausea 04/21/2020   Occlusion and stenosis of bilateral carotid arteries 04/21/2020   Osteopenia 10/02/2018   Other dysphagia 09/10/2018   Peripheral vascular disease ([place]) 07/15/2018   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   Proptosis 04/21/2020   Proximal leg weakness 09/10/2018   Ptosis of left eyelid 09/10/2018   Renal artery stenosis (HCC) 12/17/2019   Renal artery stenosis   Standard chest x-ray abnormal 04/21/2020   Vitamin D deficiency 04/21/2020   Weight loss 04/21/2020  Tobacco History: Social History   Tobacco Use  Smoking Status Never  Smokeless Tobacco Never   Counseling given: Not Answered   Continue to not smoke  Outpatient  Encounter Medications as of 03/28/2021  Medication Sig   acetaminophen (TYLENOL) 325 MG tablet Take 2 tablets (650 mg total) by mouth every 4 (four) hours as needed for headache or mild pain.   acetaminophen-codeine (TYLENOL #2) 300-15 MG tablet Take 1 tablet by mouth every 6 (six) hours as needed.   apixaban (ELIQUIS) 2.5 MG TABS tablet Take 1 tablet (2.5 mg total) by mouth 2 (two) times daily.   aspirin EC 81 MG EC tablet Take 1 tablet (81 mg total) by mouth daily at 6 (six) AM. Swallow whole.   carboxymethylcellul-glycerin (OPTIVE) 0.5-0.9 % ophthalmic solution Place 1-2 drops into both eyes 3 (three) times daily as needed for dry eyes.   carvedilol (COREG) 25 MG tablet Take 1 tablet (25 mg total) by mouth 2 (two) times daily.   Cholecalciferol 25 MCG (1000 UT) tablet Take 2,000 Units by mouth every evening.   cilostazol (PLETAL) 50 MG tablet Take 1 tablet (50 mg total) by mouth 2 (two) times daily.   dronabinol (MARINOL) 2.5 MG capsule Take 1 capsule (2.5 mg total) by mouth daily before lunch.   ezetimibe (ZETIA) 10 MG tablet Take 1 tablet (10 mg total) by mouth daily. (Patient taking differently: Take 10 mg by mouth every evening.)   famotidine (PEPCID) 20 MG tablet Take 20 mg by mouth in the morning and at bedtime.   FLUoxetine (PROZAC) 10 MG capsule Take 1 capsule (10 mg total) by mouth daily.   hydrALAZINE (APRESOLINE) 25 MG tablet Take 25 mg by mouth 3 (three) times daily.   hydrocortisone cream 1 % Apply 1 application topically 4 (four) times daily as needed for itching.   isosorbide mononitrate (IMDUR) 60 MG 24 hr tablet Take 1 tablet (60 mg total) by mouth daily.   ketoconazole (NIZORAL) 2 % cream Apply 1 application topically daily as needed for irritation.    levocetirizine (XYZAL) 5 MG tablet Take 5 mg by mouth daily as needed for allergies.   loperamide (IMODIUM) 1 MG/5ML solution Take 2 mg by mouth as needed for diarrhea or loose stools.   rosuvastatin (CRESTOR) 10 MG tablet Take  1 tablet (10 mg total) by mouth daily. (Patient taking differently: Take 10 mg by mouth every evening.)   No facility-administered encounter medications on file as of 03/28/2021.     Review of Systems  Review of Systems  N/a  Physical Exam  BP 138/64 (BP Location: Left Arm, Patient Position: Sitting, Cuff Size: Normal)   Pulse 60   Temp 98 F (36.7 C) (Oral)   Ht 5' 2"  (1.575 m)   Wt 128 lb (58.1 kg)   SpO2 95%   BMI 23.41 kg/m   Wt Readings from Last 5 Encounters:  03/28/21 128 lb (58.1 kg)  03/18/21 128 lb 12 oz (58.4 kg)  03/07/21 138 lb 0.1 oz (62.6 kg)  03/04/21 138 lb (62.6 kg)  03/01/21 138 lb (62.6 kg)    BMI Readings from Last 5 Encounters:  03/28/21 23.41 kg/m  03/18/21 23.55 kg/m  03/09/21 25.24 kg/m  03/09/21 25.24 kg/m  03/07/21 25.24 kg/m     Physical Exam Gen: Sitting in chair, in NAD Eyes: EOMI, no icterus Neck: Supple, no JVP appreciated CV: RRR, no murmur Pulm: clear, NWOB Abd: ND, BS present MSK: No joint effusion, no synovitis Neuro: Slow  gait, no focal weakness Psych: Normal mood, full affect   Assessment & Plan:   DOE: Improved.  Given multiple recurrences in setting of overload think that this most reflects pulmonary edema from cardiogenic source.  PFTs consistent with gas trapping but no emphysema never smoker.  Suspect longstanding asthma.  Her DLCO severely reduced. There is no evidence of interstitial lung disease nor is there suggestion of elevated pulmonary pressures to suggest pulmonary vascular disease.  Suspect this is related to chronic volume overload.  No fixed obstruction to suggest bronchiolitis obliterans.  Obstructive sleep apnea: Mild, AHI 8.  With nocturnal desaturations in the pattern of apnea/hypopnea events.  In lab CPAP titration with bleeding oxygen ordered for this reason.   Return in about 3 months (around 06/26/2021).   Lanier Clam, MD 03/28/2021

## 2021-03-29 DIAGNOSIS — T8249XD Other complication of vascular dialysis catheter, subsequent encounter: Secondary | ICD-10-CM | POA: Diagnosis not present

## 2021-03-29 DIAGNOSIS — Z992 Dependence on renal dialysis: Secondary | ICD-10-CM | POA: Diagnosis not present

## 2021-03-29 DIAGNOSIS — N186 End stage renal disease: Secondary | ICD-10-CM | POA: Diagnosis not present

## 2021-03-29 DIAGNOSIS — E876 Hypokalemia: Secondary | ICD-10-CM | POA: Diagnosis not present

## 2021-03-29 DIAGNOSIS — N2581 Secondary hyperparathyroidism of renal origin: Secondary | ICD-10-CM | POA: Diagnosis not present

## 2021-03-29 DIAGNOSIS — E1165 Type 2 diabetes mellitus with hyperglycemia: Secondary | ICD-10-CM | POA: Diagnosis not present

## 2021-03-29 DIAGNOSIS — D631 Anemia in chronic kidney disease: Secondary | ICD-10-CM | POA: Diagnosis not present

## 2021-03-29 DIAGNOSIS — D509 Iron deficiency anemia, unspecified: Secondary | ICD-10-CM | POA: Diagnosis not present

## 2021-03-29 DIAGNOSIS — I120 Hypertensive chronic kidney disease with stage 5 chronic kidney disease or end stage renal disease: Secondary | ICD-10-CM | POA: Diagnosis not present

## 2021-03-30 DIAGNOSIS — Z7901 Long term (current) use of anticoagulants: Secondary | ICD-10-CM | POA: Diagnosis not present

## 2021-03-30 DIAGNOSIS — I6523 Occlusion and stenosis of bilateral carotid arteries: Secondary | ICD-10-CM | POA: Diagnosis not present

## 2021-03-30 DIAGNOSIS — R1319 Other dysphagia: Secondary | ICD-10-CM | POA: Diagnosis not present

## 2021-03-30 DIAGNOSIS — E1151 Type 2 diabetes mellitus with diabetic peripheral angiopathy without gangrene: Secondary | ICD-10-CM | POA: Diagnosis not present

## 2021-03-30 DIAGNOSIS — E785 Hyperlipidemia, unspecified: Secondary | ICD-10-CM | POA: Diagnosis not present

## 2021-03-30 DIAGNOSIS — Z7982 Long term (current) use of aspirin: Secondary | ICD-10-CM | POA: Diagnosis not present

## 2021-03-30 DIAGNOSIS — L89890 Pressure ulcer of other site, unstageable: Secondary | ICD-10-CM | POA: Diagnosis not present

## 2021-03-30 DIAGNOSIS — N186 End stage renal disease: Secondary | ICD-10-CM | POA: Diagnosis not present

## 2021-03-30 DIAGNOSIS — M8580 Other specified disorders of bone density and structure, unspecified site: Secondary | ICD-10-CM | POA: Diagnosis not present

## 2021-03-30 DIAGNOSIS — L89893 Pressure ulcer of other site, stage 3: Secondary | ICD-10-CM | POA: Diagnosis not present

## 2021-03-30 DIAGNOSIS — I132 Hypertensive heart and chronic kidney disease with heart failure and with stage 5 chronic kidney disease, or end stage renal disease: Secondary | ICD-10-CM | POA: Diagnosis not present

## 2021-03-30 DIAGNOSIS — J9621 Acute and chronic respiratory failure with hypoxia: Secondary | ICD-10-CM | POA: Diagnosis not present

## 2021-03-30 DIAGNOSIS — L89620 Pressure ulcer of left heel, unstageable: Secondary | ICD-10-CM | POA: Diagnosis not present

## 2021-03-30 DIAGNOSIS — I2510 Atherosclerotic heart disease of native coronary artery without angina pectoris: Secondary | ICD-10-CM | POA: Diagnosis not present

## 2021-03-30 DIAGNOSIS — E1122 Type 2 diabetes mellitus with diabetic chronic kidney disease: Secondary | ICD-10-CM | POA: Diagnosis not present

## 2021-03-30 DIAGNOSIS — K219 Gastro-esophageal reflux disease without esophagitis: Secondary | ICD-10-CM | POA: Diagnosis not present

## 2021-03-30 DIAGNOSIS — I051 Rheumatic mitral insufficiency: Secondary | ICD-10-CM | POA: Diagnosis not present

## 2021-03-30 DIAGNOSIS — D631 Anemia in chronic kidney disease: Secondary | ICD-10-CM | POA: Diagnosis not present

## 2021-03-30 DIAGNOSIS — K589 Irritable bowel syndrome without diarrhea: Secondary | ICD-10-CM | POA: Diagnosis not present

## 2021-03-30 DIAGNOSIS — H6123 Impacted cerumen, bilateral: Secondary | ICD-10-CM | POA: Diagnosis not present

## 2021-03-30 DIAGNOSIS — I701 Atherosclerosis of renal artery: Secondary | ICD-10-CM | POA: Diagnosis not present

## 2021-03-30 DIAGNOSIS — J9622 Acute and chronic respiratory failure with hypercapnia: Secondary | ICD-10-CM | POA: Diagnosis not present

## 2021-03-30 DIAGNOSIS — I70202 Unspecified atherosclerosis of native arteries of extremities, left leg: Secondary | ICD-10-CM | POA: Diagnosis not present

## 2021-03-30 DIAGNOSIS — I5033 Acute on chronic diastolic (congestive) heart failure: Secondary | ICD-10-CM | POA: Diagnosis not present

## 2021-03-31 DIAGNOSIS — T8249XD Other complication of vascular dialysis catheter, subsequent encounter: Secondary | ICD-10-CM | POA: Diagnosis not present

## 2021-03-31 DIAGNOSIS — N2581 Secondary hyperparathyroidism of renal origin: Secondary | ICD-10-CM | POA: Diagnosis not present

## 2021-03-31 DIAGNOSIS — N186 End stage renal disease: Secondary | ICD-10-CM | POA: Diagnosis not present

## 2021-03-31 DIAGNOSIS — E1165 Type 2 diabetes mellitus with hyperglycemia: Secondary | ICD-10-CM | POA: Diagnosis not present

## 2021-03-31 DIAGNOSIS — I120 Hypertensive chronic kidney disease with stage 5 chronic kidney disease or end stage renal disease: Secondary | ICD-10-CM | POA: Diagnosis not present

## 2021-03-31 DIAGNOSIS — D509 Iron deficiency anemia, unspecified: Secondary | ICD-10-CM | POA: Diagnosis not present

## 2021-03-31 DIAGNOSIS — D631 Anemia in chronic kidney disease: Secondary | ICD-10-CM | POA: Diagnosis not present

## 2021-03-31 DIAGNOSIS — E876 Hypokalemia: Secondary | ICD-10-CM | POA: Diagnosis not present

## 2021-03-31 DIAGNOSIS — Z992 Dependence on renal dialysis: Secondary | ICD-10-CM | POA: Diagnosis not present

## 2021-04-02 DIAGNOSIS — N186 End stage renal disease: Secondary | ICD-10-CM | POA: Diagnosis not present

## 2021-04-02 DIAGNOSIS — Z992 Dependence on renal dialysis: Secondary | ICD-10-CM | POA: Diagnosis not present

## 2021-04-02 DIAGNOSIS — D631 Anemia in chronic kidney disease: Secondary | ICD-10-CM | POA: Diagnosis not present

## 2021-04-02 DIAGNOSIS — E1165 Type 2 diabetes mellitus with hyperglycemia: Secondary | ICD-10-CM | POA: Diagnosis not present

## 2021-04-02 DIAGNOSIS — T8249XD Other complication of vascular dialysis catheter, subsequent encounter: Secondary | ICD-10-CM | POA: Diagnosis not present

## 2021-04-02 DIAGNOSIS — E876 Hypokalemia: Secondary | ICD-10-CM | POA: Diagnosis not present

## 2021-04-02 DIAGNOSIS — N2581 Secondary hyperparathyroidism of renal origin: Secondary | ICD-10-CM | POA: Diagnosis not present

## 2021-04-02 DIAGNOSIS — D509 Iron deficiency anemia, unspecified: Secondary | ICD-10-CM | POA: Diagnosis not present

## 2021-04-02 DIAGNOSIS — I120 Hypertensive chronic kidney disease with stage 5 chronic kidney disease or end stage renal disease: Secondary | ICD-10-CM | POA: Diagnosis not present

## 2021-04-05 DIAGNOSIS — T8249XD Other complication of vascular dialysis catheter, subsequent encounter: Secondary | ICD-10-CM | POA: Diagnosis not present

## 2021-04-05 DIAGNOSIS — I120 Hypertensive chronic kidney disease with stage 5 chronic kidney disease or end stage renal disease: Secondary | ICD-10-CM | POA: Diagnosis not present

## 2021-04-05 DIAGNOSIS — D509 Iron deficiency anemia, unspecified: Secondary | ICD-10-CM | POA: Diagnosis not present

## 2021-04-05 DIAGNOSIS — N186 End stage renal disease: Secondary | ICD-10-CM | POA: Diagnosis not present

## 2021-04-05 DIAGNOSIS — E876 Hypokalemia: Secondary | ICD-10-CM | POA: Diagnosis not present

## 2021-04-05 DIAGNOSIS — N2581 Secondary hyperparathyroidism of renal origin: Secondary | ICD-10-CM | POA: Diagnosis not present

## 2021-04-05 DIAGNOSIS — E1165 Type 2 diabetes mellitus with hyperglycemia: Secondary | ICD-10-CM | POA: Diagnosis not present

## 2021-04-05 DIAGNOSIS — D631 Anemia in chronic kidney disease: Secondary | ICD-10-CM | POA: Diagnosis not present

## 2021-04-05 DIAGNOSIS — Z992 Dependence on renal dialysis: Secondary | ICD-10-CM | POA: Diagnosis not present

## 2021-04-06 DIAGNOSIS — E1122 Type 2 diabetes mellitus with diabetic chronic kidney disease: Secondary | ICD-10-CM | POA: Diagnosis not present

## 2021-04-06 DIAGNOSIS — J9622 Acute and chronic respiratory failure with hypercapnia: Secondary | ICD-10-CM | POA: Diagnosis not present

## 2021-04-06 DIAGNOSIS — Z7901 Long term (current) use of anticoagulants: Secondary | ICD-10-CM | POA: Diagnosis not present

## 2021-04-06 DIAGNOSIS — I70202 Unspecified atherosclerosis of native arteries of extremities, left leg: Secondary | ICD-10-CM | POA: Diagnosis not present

## 2021-04-06 DIAGNOSIS — L89893 Pressure ulcer of other site, stage 3: Secondary | ICD-10-CM | POA: Diagnosis not present

## 2021-04-06 DIAGNOSIS — Z7982 Long term (current) use of aspirin: Secondary | ICD-10-CM | POA: Diagnosis not present

## 2021-04-06 DIAGNOSIS — I2510 Atherosclerotic heart disease of native coronary artery without angina pectoris: Secondary | ICD-10-CM | POA: Diagnosis not present

## 2021-04-06 DIAGNOSIS — N186 End stage renal disease: Secondary | ICD-10-CM | POA: Diagnosis not present

## 2021-04-06 DIAGNOSIS — L89890 Pressure ulcer of other site, unstageable: Secondary | ICD-10-CM | POA: Diagnosis not present

## 2021-04-06 DIAGNOSIS — H6123 Impacted cerumen, bilateral: Secondary | ICD-10-CM | POA: Diagnosis not present

## 2021-04-06 DIAGNOSIS — M8580 Other specified disorders of bone density and structure, unspecified site: Secondary | ICD-10-CM | POA: Diagnosis not present

## 2021-04-06 DIAGNOSIS — J9621 Acute and chronic respiratory failure with hypoxia: Secondary | ICD-10-CM | POA: Diagnosis not present

## 2021-04-06 DIAGNOSIS — I5033 Acute on chronic diastolic (congestive) heart failure: Secondary | ICD-10-CM | POA: Diagnosis not present

## 2021-04-06 DIAGNOSIS — E1151 Type 2 diabetes mellitus with diabetic peripheral angiopathy without gangrene: Secondary | ICD-10-CM | POA: Diagnosis not present

## 2021-04-06 DIAGNOSIS — K589 Irritable bowel syndrome without diarrhea: Secondary | ICD-10-CM | POA: Diagnosis not present

## 2021-04-06 DIAGNOSIS — K219 Gastro-esophageal reflux disease without esophagitis: Secondary | ICD-10-CM | POA: Diagnosis not present

## 2021-04-06 DIAGNOSIS — I701 Atherosclerosis of renal artery: Secondary | ICD-10-CM | POA: Diagnosis not present

## 2021-04-06 DIAGNOSIS — I6523 Occlusion and stenosis of bilateral carotid arteries: Secondary | ICD-10-CM | POA: Diagnosis not present

## 2021-04-06 DIAGNOSIS — I132 Hypertensive heart and chronic kidney disease with heart failure and with stage 5 chronic kidney disease, or end stage renal disease: Secondary | ICD-10-CM | POA: Diagnosis not present

## 2021-04-06 DIAGNOSIS — I051 Rheumatic mitral insufficiency: Secondary | ICD-10-CM | POA: Diagnosis not present

## 2021-04-06 DIAGNOSIS — L89620 Pressure ulcer of left heel, unstageable: Secondary | ICD-10-CM | POA: Diagnosis not present

## 2021-04-06 DIAGNOSIS — E785 Hyperlipidemia, unspecified: Secondary | ICD-10-CM | POA: Diagnosis not present

## 2021-04-06 DIAGNOSIS — R1319 Other dysphagia: Secondary | ICD-10-CM | POA: Diagnosis not present

## 2021-04-06 DIAGNOSIS — D631 Anemia in chronic kidney disease: Secondary | ICD-10-CM | POA: Diagnosis not present

## 2021-04-07 DIAGNOSIS — N2581 Secondary hyperparathyroidism of renal origin: Secondary | ICD-10-CM | POA: Diagnosis not present

## 2021-04-07 DIAGNOSIS — D509 Iron deficiency anemia, unspecified: Secondary | ICD-10-CM | POA: Diagnosis not present

## 2021-04-07 DIAGNOSIS — E1165 Type 2 diabetes mellitus with hyperglycemia: Secondary | ICD-10-CM | POA: Diagnosis not present

## 2021-04-07 DIAGNOSIS — L89890 Pressure ulcer of other site, unstageable: Secondary | ICD-10-CM | POA: Diagnosis not present

## 2021-04-07 DIAGNOSIS — E876 Hypokalemia: Secondary | ICD-10-CM | POA: Diagnosis not present

## 2021-04-07 DIAGNOSIS — D631 Anemia in chronic kidney disease: Secondary | ICD-10-CM | POA: Diagnosis not present

## 2021-04-07 DIAGNOSIS — T8249XD Other complication of vascular dialysis catheter, subsequent encounter: Secondary | ICD-10-CM | POA: Diagnosis not present

## 2021-04-07 DIAGNOSIS — N186 End stage renal disease: Secondary | ICD-10-CM | POA: Diagnosis not present

## 2021-04-07 DIAGNOSIS — Z992 Dependence on renal dialysis: Secondary | ICD-10-CM | POA: Diagnosis not present

## 2021-04-07 DIAGNOSIS — I120 Hypertensive chronic kidney disease with stage 5 chronic kidney disease or end stage renal disease: Secondary | ICD-10-CM | POA: Diagnosis not present

## 2021-04-08 DIAGNOSIS — E1122 Type 2 diabetes mellitus with diabetic chronic kidney disease: Secondary | ICD-10-CM | POA: Diagnosis not present

## 2021-04-08 DIAGNOSIS — Z992 Dependence on renal dialysis: Secondary | ICD-10-CM | POA: Diagnosis not present

## 2021-04-08 DIAGNOSIS — Z23 Encounter for immunization: Secondary | ICD-10-CM | POA: Diagnosis not present

## 2021-04-08 DIAGNOSIS — K589 Irritable bowel syndrome without diarrhea: Secondary | ICD-10-CM | POA: Diagnosis not present

## 2021-04-08 DIAGNOSIS — M8580 Other specified disorders of bone density and structure, unspecified site: Secondary | ICD-10-CM | POA: Diagnosis not present

## 2021-04-08 DIAGNOSIS — N186 End stage renal disease: Secondary | ICD-10-CM | POA: Diagnosis not present

## 2021-04-08 DIAGNOSIS — I5033 Acute on chronic diastolic (congestive) heart failure: Secondary | ICD-10-CM | POA: Diagnosis not present

## 2021-04-08 DIAGNOSIS — L89620 Pressure ulcer of left heel, unstageable: Secondary | ICD-10-CM | POA: Diagnosis not present

## 2021-04-08 DIAGNOSIS — J9621 Acute and chronic respiratory failure with hypoxia: Secondary | ICD-10-CM | POA: Diagnosis not present

## 2021-04-08 DIAGNOSIS — I70202 Unspecified atherosclerosis of native arteries of extremities, left leg: Secondary | ICD-10-CM | POA: Diagnosis not present

## 2021-04-08 DIAGNOSIS — I701 Atherosclerosis of renal artery: Secondary | ICD-10-CM | POA: Diagnosis not present

## 2021-04-08 DIAGNOSIS — I2510 Atherosclerotic heart disease of native coronary artery without angina pectoris: Secondary | ICD-10-CM | POA: Diagnosis not present

## 2021-04-08 DIAGNOSIS — I700 Atherosclerosis of aorta: Secondary | ICD-10-CM | POA: Diagnosis not present

## 2021-04-08 DIAGNOSIS — I509 Heart failure, unspecified: Secondary | ICD-10-CM | POA: Diagnosis not present

## 2021-04-08 DIAGNOSIS — Z7901 Long term (current) use of anticoagulants: Secondary | ICD-10-CM | POA: Diagnosis not present

## 2021-04-08 DIAGNOSIS — G629 Polyneuropathy, unspecified: Secondary | ICD-10-CM | POA: Diagnosis not present

## 2021-04-08 DIAGNOSIS — E559 Vitamin D deficiency, unspecified: Secondary | ICD-10-CM | POA: Diagnosis not present

## 2021-04-08 DIAGNOSIS — L89890 Pressure ulcer of other site, unstageable: Secondary | ICD-10-CM | POA: Diagnosis not present

## 2021-04-08 DIAGNOSIS — E785 Hyperlipidemia, unspecified: Secondary | ICD-10-CM | POA: Diagnosis not present

## 2021-04-08 DIAGNOSIS — I051 Rheumatic mitral insufficiency: Secondary | ICD-10-CM | POA: Diagnosis not present

## 2021-04-08 DIAGNOSIS — K219 Gastro-esophageal reflux disease without esophagitis: Secondary | ICD-10-CM | POA: Diagnosis not present

## 2021-04-08 DIAGNOSIS — I132 Hypertensive heart and chronic kidney disease with heart failure and with stage 5 chronic kidney disease, or end stage renal disease: Secondary | ICD-10-CM | POA: Diagnosis not present

## 2021-04-08 DIAGNOSIS — J9622 Acute and chronic respiratory failure with hypercapnia: Secondary | ICD-10-CM | POA: Diagnosis not present

## 2021-04-08 DIAGNOSIS — I10 Essential (primary) hypertension: Secondary | ICD-10-CM | POA: Diagnosis not present

## 2021-04-08 DIAGNOSIS — L89893 Pressure ulcer of other site, stage 3: Secondary | ICD-10-CM | POA: Diagnosis not present

## 2021-04-08 DIAGNOSIS — Z794 Long term (current) use of insulin: Secondary | ICD-10-CM | POA: Diagnosis not present

## 2021-04-08 DIAGNOSIS — I6523 Occlusion and stenosis of bilateral carotid arteries: Secondary | ICD-10-CM | POA: Diagnosis not present

## 2021-04-08 DIAGNOSIS — R1319 Other dysphagia: Secondary | ICD-10-CM | POA: Diagnosis not present

## 2021-04-08 DIAGNOSIS — H6123 Impacted cerumen, bilateral: Secondary | ICD-10-CM | POA: Diagnosis not present

## 2021-04-08 DIAGNOSIS — H0520 Unspecified exophthalmos: Secondary | ICD-10-CM | POA: Diagnosis not present

## 2021-04-08 DIAGNOSIS — E119 Type 2 diabetes mellitus without complications: Secondary | ICD-10-CM | POA: Diagnosis not present

## 2021-04-08 DIAGNOSIS — D631 Anemia in chronic kidney disease: Secondary | ICD-10-CM | POA: Diagnosis not present

## 2021-04-08 DIAGNOSIS — E113311 Type 2 diabetes mellitus with moderate nonproliferative diabetic retinopathy with macular edema, right eye: Secondary | ICD-10-CM | POA: Diagnosis not present

## 2021-04-08 DIAGNOSIS — E1151 Type 2 diabetes mellitus with diabetic peripheral angiopathy without gangrene: Secondary | ICD-10-CM | POA: Diagnosis not present

## 2021-04-08 DIAGNOSIS — Z7982 Long term (current) use of aspirin: Secondary | ICD-10-CM | POA: Diagnosis not present

## 2021-04-08 DIAGNOSIS — Z0000 Encounter for general adult medical examination without abnormal findings: Secondary | ICD-10-CM | POA: Diagnosis not present

## 2021-04-09 DIAGNOSIS — E876 Hypokalemia: Secondary | ICD-10-CM | POA: Diagnosis not present

## 2021-04-09 DIAGNOSIS — T8249XD Other complication of vascular dialysis catheter, subsequent encounter: Secondary | ICD-10-CM | POA: Diagnosis not present

## 2021-04-09 DIAGNOSIS — Z992 Dependence on renal dialysis: Secondary | ICD-10-CM | POA: Diagnosis not present

## 2021-04-09 DIAGNOSIS — I120 Hypertensive chronic kidney disease with stage 5 chronic kidney disease or end stage renal disease: Secondary | ICD-10-CM | POA: Diagnosis not present

## 2021-04-09 DIAGNOSIS — D509 Iron deficiency anemia, unspecified: Secondary | ICD-10-CM | POA: Diagnosis not present

## 2021-04-09 DIAGNOSIS — E1165 Type 2 diabetes mellitus with hyperglycemia: Secondary | ICD-10-CM | POA: Diagnosis not present

## 2021-04-09 DIAGNOSIS — D631 Anemia in chronic kidney disease: Secondary | ICD-10-CM | POA: Diagnosis not present

## 2021-04-09 DIAGNOSIS — N186 End stage renal disease: Secondary | ICD-10-CM | POA: Diagnosis not present

## 2021-04-09 DIAGNOSIS — N2581 Secondary hyperparathyroidism of renal origin: Secondary | ICD-10-CM | POA: Diagnosis not present

## 2021-04-12 DIAGNOSIS — T8249XD Other complication of vascular dialysis catheter, subsequent encounter: Secondary | ICD-10-CM | POA: Diagnosis not present

## 2021-04-12 DIAGNOSIS — Z992 Dependence on renal dialysis: Secondary | ICD-10-CM | POA: Diagnosis not present

## 2021-04-12 DIAGNOSIS — D509 Iron deficiency anemia, unspecified: Secondary | ICD-10-CM | POA: Diagnosis not present

## 2021-04-12 DIAGNOSIS — N186 End stage renal disease: Secondary | ICD-10-CM | POA: Diagnosis not present

## 2021-04-12 DIAGNOSIS — E1165 Type 2 diabetes mellitus with hyperglycemia: Secondary | ICD-10-CM | POA: Diagnosis not present

## 2021-04-12 DIAGNOSIS — I120 Hypertensive chronic kidney disease with stage 5 chronic kidney disease or end stage renal disease: Secondary | ICD-10-CM | POA: Diagnosis not present

## 2021-04-12 DIAGNOSIS — E876 Hypokalemia: Secondary | ICD-10-CM | POA: Diagnosis not present

## 2021-04-12 DIAGNOSIS — D631 Anemia in chronic kidney disease: Secondary | ICD-10-CM | POA: Diagnosis not present

## 2021-04-12 DIAGNOSIS — N2581 Secondary hyperparathyroidism of renal origin: Secondary | ICD-10-CM | POA: Diagnosis not present

## 2021-04-13 DIAGNOSIS — I2510 Atherosclerotic heart disease of native coronary artery without angina pectoris: Secondary | ICD-10-CM | POA: Diagnosis not present

## 2021-04-13 DIAGNOSIS — R1319 Other dysphagia: Secondary | ICD-10-CM | POA: Diagnosis not present

## 2021-04-13 DIAGNOSIS — E1122 Type 2 diabetes mellitus with diabetic chronic kidney disease: Secondary | ICD-10-CM | POA: Diagnosis not present

## 2021-04-13 DIAGNOSIS — H6123 Impacted cerumen, bilateral: Secondary | ICD-10-CM | POA: Diagnosis not present

## 2021-04-13 DIAGNOSIS — L89620 Pressure ulcer of left heel, unstageable: Secondary | ICD-10-CM | POA: Diagnosis not present

## 2021-04-13 DIAGNOSIS — J9622 Acute and chronic respiratory failure with hypercapnia: Secondary | ICD-10-CM | POA: Diagnosis not present

## 2021-04-13 DIAGNOSIS — M8580 Other specified disorders of bone density and structure, unspecified site: Secondary | ICD-10-CM | POA: Diagnosis not present

## 2021-04-13 DIAGNOSIS — I6523 Occlusion and stenosis of bilateral carotid arteries: Secondary | ICD-10-CM | POA: Diagnosis not present

## 2021-04-13 DIAGNOSIS — K589 Irritable bowel syndrome without diarrhea: Secondary | ICD-10-CM | POA: Diagnosis not present

## 2021-04-13 DIAGNOSIS — I5033 Acute on chronic diastolic (congestive) heart failure: Secondary | ICD-10-CM | POA: Diagnosis not present

## 2021-04-13 DIAGNOSIS — I70202 Unspecified atherosclerosis of native arteries of extremities, left leg: Secondary | ICD-10-CM | POA: Diagnosis not present

## 2021-04-13 DIAGNOSIS — E1151 Type 2 diabetes mellitus with diabetic peripheral angiopathy without gangrene: Secondary | ICD-10-CM | POA: Diagnosis not present

## 2021-04-13 DIAGNOSIS — E785 Hyperlipidemia, unspecified: Secondary | ICD-10-CM | POA: Diagnosis not present

## 2021-04-13 DIAGNOSIS — N186 End stage renal disease: Secondary | ICD-10-CM | POA: Diagnosis not present

## 2021-04-13 DIAGNOSIS — L89893 Pressure ulcer of other site, stage 3: Secondary | ICD-10-CM | POA: Diagnosis not present

## 2021-04-13 DIAGNOSIS — K219 Gastro-esophageal reflux disease without esophagitis: Secondary | ICD-10-CM | POA: Diagnosis not present

## 2021-04-13 DIAGNOSIS — I051 Rheumatic mitral insufficiency: Secondary | ICD-10-CM | POA: Diagnosis not present

## 2021-04-13 DIAGNOSIS — L89890 Pressure ulcer of other site, unstageable: Secondary | ICD-10-CM | POA: Diagnosis not present

## 2021-04-13 DIAGNOSIS — Z7982 Long term (current) use of aspirin: Secondary | ICD-10-CM | POA: Diagnosis not present

## 2021-04-13 DIAGNOSIS — I701 Atherosclerosis of renal artery: Secondary | ICD-10-CM | POA: Diagnosis not present

## 2021-04-13 DIAGNOSIS — I132 Hypertensive heart and chronic kidney disease with heart failure and with stage 5 chronic kidney disease, or end stage renal disease: Secondary | ICD-10-CM | POA: Diagnosis not present

## 2021-04-13 DIAGNOSIS — Z7901 Long term (current) use of anticoagulants: Secondary | ICD-10-CM | POA: Diagnosis not present

## 2021-04-13 DIAGNOSIS — J9621 Acute and chronic respiratory failure with hypoxia: Secondary | ICD-10-CM | POA: Diagnosis not present

## 2021-04-13 DIAGNOSIS — D631 Anemia in chronic kidney disease: Secondary | ICD-10-CM | POA: Diagnosis not present

## 2021-04-14 DIAGNOSIS — T8249XD Other complication of vascular dialysis catheter, subsequent encounter: Secondary | ICD-10-CM | POA: Diagnosis not present

## 2021-04-14 DIAGNOSIS — D509 Iron deficiency anemia, unspecified: Secondary | ICD-10-CM | POA: Diagnosis not present

## 2021-04-14 DIAGNOSIS — J9622 Acute and chronic respiratory failure with hypercapnia: Secondary | ICD-10-CM | POA: Diagnosis not present

## 2021-04-14 DIAGNOSIS — L89890 Pressure ulcer of other site, unstageable: Secondary | ICD-10-CM | POA: Diagnosis not present

## 2021-04-14 DIAGNOSIS — L89620 Pressure ulcer of left heel, unstageable: Secondary | ICD-10-CM | POA: Diagnosis not present

## 2021-04-14 DIAGNOSIS — E1122 Type 2 diabetes mellitus with diabetic chronic kidney disease: Secondary | ICD-10-CM | POA: Diagnosis not present

## 2021-04-14 DIAGNOSIS — K219 Gastro-esophageal reflux disease without esophagitis: Secondary | ICD-10-CM | POA: Diagnosis not present

## 2021-04-14 DIAGNOSIS — I051 Rheumatic mitral insufficiency: Secondary | ICD-10-CM | POA: Diagnosis not present

## 2021-04-14 DIAGNOSIS — H6123 Impacted cerumen, bilateral: Secondary | ICD-10-CM | POA: Diagnosis not present

## 2021-04-14 DIAGNOSIS — J9621 Acute and chronic respiratory failure with hypoxia: Secondary | ICD-10-CM | POA: Diagnosis not present

## 2021-04-14 DIAGNOSIS — E876 Hypokalemia: Secondary | ICD-10-CM | POA: Diagnosis not present

## 2021-04-14 DIAGNOSIS — R1319 Other dysphagia: Secondary | ICD-10-CM | POA: Diagnosis not present

## 2021-04-14 DIAGNOSIS — I2510 Atherosclerotic heart disease of native coronary artery without angina pectoris: Secondary | ICD-10-CM | POA: Diagnosis not present

## 2021-04-14 DIAGNOSIS — I132 Hypertensive heart and chronic kidney disease with heart failure and with stage 5 chronic kidney disease, or end stage renal disease: Secondary | ICD-10-CM | POA: Diagnosis not present

## 2021-04-14 DIAGNOSIS — E785 Hyperlipidemia, unspecified: Secondary | ICD-10-CM | POA: Diagnosis not present

## 2021-04-14 DIAGNOSIS — K589 Irritable bowel syndrome without diarrhea: Secondary | ICD-10-CM | POA: Diagnosis not present

## 2021-04-14 DIAGNOSIS — N186 End stage renal disease: Secondary | ICD-10-CM | POA: Diagnosis not present

## 2021-04-14 DIAGNOSIS — D631 Anemia in chronic kidney disease: Secondary | ICD-10-CM | POA: Diagnosis not present

## 2021-04-14 DIAGNOSIS — I701 Atherosclerosis of renal artery: Secondary | ICD-10-CM | POA: Diagnosis not present

## 2021-04-14 DIAGNOSIS — Z992 Dependence on renal dialysis: Secondary | ICD-10-CM | POA: Diagnosis not present

## 2021-04-14 DIAGNOSIS — I120 Hypertensive chronic kidney disease with stage 5 chronic kidney disease or end stage renal disease: Secondary | ICD-10-CM | POA: Diagnosis not present

## 2021-04-14 DIAGNOSIS — I6523 Occlusion and stenosis of bilateral carotid arteries: Secondary | ICD-10-CM | POA: Diagnosis not present

## 2021-04-14 DIAGNOSIS — I70202 Unspecified atherosclerosis of native arteries of extremities, left leg: Secondary | ICD-10-CM | POA: Diagnosis not present

## 2021-04-14 DIAGNOSIS — E1165 Type 2 diabetes mellitus with hyperglycemia: Secondary | ICD-10-CM | POA: Diagnosis not present

## 2021-04-14 DIAGNOSIS — I5033 Acute on chronic diastolic (congestive) heart failure: Secondary | ICD-10-CM | POA: Diagnosis not present

## 2021-04-14 DIAGNOSIS — L89893 Pressure ulcer of other site, stage 3: Secondary | ICD-10-CM | POA: Diagnosis not present

## 2021-04-14 DIAGNOSIS — E1151 Type 2 diabetes mellitus with diabetic peripheral angiopathy without gangrene: Secondary | ICD-10-CM | POA: Diagnosis not present

## 2021-04-14 DIAGNOSIS — N2581 Secondary hyperparathyroidism of renal origin: Secondary | ICD-10-CM | POA: Diagnosis not present

## 2021-04-14 DIAGNOSIS — M8580 Other specified disorders of bone density and structure, unspecified site: Secondary | ICD-10-CM | POA: Diagnosis not present

## 2021-04-14 DIAGNOSIS — Z7901 Long term (current) use of anticoagulants: Secondary | ICD-10-CM | POA: Diagnosis not present

## 2021-04-14 DIAGNOSIS — Z7982 Long term (current) use of aspirin: Secondary | ICD-10-CM | POA: Diagnosis not present

## 2021-04-16 DIAGNOSIS — E876 Hypokalemia: Secondary | ICD-10-CM | POA: Diagnosis not present

## 2021-04-16 DIAGNOSIS — E1165 Type 2 diabetes mellitus with hyperglycemia: Secondary | ICD-10-CM | POA: Diagnosis not present

## 2021-04-16 DIAGNOSIS — I129 Hypertensive chronic kidney disease with stage 1 through stage 4 chronic kidney disease, or unspecified chronic kidney disease: Secondary | ICD-10-CM | POA: Diagnosis not present

## 2021-04-16 DIAGNOSIS — N186 End stage renal disease: Secondary | ICD-10-CM | POA: Diagnosis not present

## 2021-04-16 DIAGNOSIS — I7025 Atherosclerosis of native arteries of other extremities with ulceration: Secondary | ICD-10-CM | POA: Diagnosis not present

## 2021-04-16 DIAGNOSIS — D509 Iron deficiency anemia, unspecified: Secondary | ICD-10-CM | POA: Diagnosis not present

## 2021-04-16 DIAGNOSIS — N2581 Secondary hyperparathyroidism of renal origin: Secondary | ICD-10-CM | POA: Diagnosis not present

## 2021-04-16 DIAGNOSIS — T8249XD Other complication of vascular dialysis catheter, subsequent encounter: Secondary | ICD-10-CM | POA: Diagnosis not present

## 2021-04-16 DIAGNOSIS — D631 Anemia in chronic kidney disease: Secondary | ICD-10-CM | POA: Diagnosis not present

## 2021-04-16 DIAGNOSIS — Z992 Dependence on renal dialysis: Secondary | ICD-10-CM | POA: Diagnosis not present

## 2021-04-16 DIAGNOSIS — I120 Hypertensive chronic kidney disease with stage 5 chronic kidney disease or end stage renal disease: Secondary | ICD-10-CM | POA: Diagnosis not present

## 2021-04-17 DIAGNOSIS — N186 End stage renal disease: Secondary | ICD-10-CM | POA: Diagnosis not present

## 2021-04-17 DIAGNOSIS — Z992 Dependence on renal dialysis: Secondary | ICD-10-CM | POA: Diagnosis not present

## 2021-04-17 DIAGNOSIS — I129 Hypertensive chronic kidney disease with stage 1 through stage 4 chronic kidney disease, or unspecified chronic kidney disease: Secondary | ICD-10-CM | POA: Diagnosis not present

## 2021-04-19 ENCOUNTER — Encounter (HOSPITAL_COMMUNITY): Payer: Medicare Other

## 2021-04-19 DIAGNOSIS — I120 Hypertensive chronic kidney disease with stage 5 chronic kidney disease or end stage renal disease: Secondary | ICD-10-CM | POA: Diagnosis not present

## 2021-04-19 DIAGNOSIS — D509 Iron deficiency anemia, unspecified: Secondary | ICD-10-CM | POA: Diagnosis not present

## 2021-04-19 DIAGNOSIS — Z992 Dependence on renal dialysis: Secondary | ICD-10-CM | POA: Diagnosis not present

## 2021-04-19 DIAGNOSIS — N2581 Secondary hyperparathyroidism of renal origin: Secondary | ICD-10-CM | POA: Diagnosis not present

## 2021-04-19 DIAGNOSIS — D631 Anemia in chronic kidney disease: Secondary | ICD-10-CM | POA: Diagnosis not present

## 2021-04-19 DIAGNOSIS — T8249XD Other complication of vascular dialysis catheter, subsequent encounter: Secondary | ICD-10-CM | POA: Diagnosis not present

## 2021-04-19 DIAGNOSIS — R531 Weakness: Secondary | ICD-10-CM | POA: Diagnosis not present

## 2021-04-19 DIAGNOSIS — E876 Hypokalemia: Secondary | ICD-10-CM | POA: Diagnosis not present

## 2021-04-19 DIAGNOSIS — E1165 Type 2 diabetes mellitus with hyperglycemia: Secondary | ICD-10-CM | POA: Diagnosis not present

## 2021-04-19 DIAGNOSIS — N186 End stage renal disease: Secondary | ICD-10-CM | POA: Diagnosis not present

## 2021-04-22 DIAGNOSIS — I051 Rheumatic mitral insufficiency: Secondary | ICD-10-CM | POA: Diagnosis not present

## 2021-04-22 DIAGNOSIS — E1122 Type 2 diabetes mellitus with diabetic chronic kidney disease: Secondary | ICD-10-CM | POA: Diagnosis not present

## 2021-04-22 DIAGNOSIS — J9622 Acute and chronic respiratory failure with hypercapnia: Secondary | ICD-10-CM | POA: Diagnosis not present

## 2021-04-22 DIAGNOSIS — L89893 Pressure ulcer of other site, stage 3: Secondary | ICD-10-CM | POA: Diagnosis not present

## 2021-04-22 DIAGNOSIS — J9621 Acute and chronic respiratory failure with hypoxia: Secondary | ICD-10-CM | POA: Diagnosis not present

## 2021-04-22 DIAGNOSIS — I701 Atherosclerosis of renal artery: Secondary | ICD-10-CM | POA: Diagnosis not present

## 2021-04-22 DIAGNOSIS — E785 Hyperlipidemia, unspecified: Secondary | ICD-10-CM | POA: Diagnosis not present

## 2021-04-22 DIAGNOSIS — Z7901 Long term (current) use of anticoagulants: Secondary | ICD-10-CM | POA: Diagnosis not present

## 2021-04-22 DIAGNOSIS — R1319 Other dysphagia: Secondary | ICD-10-CM | POA: Diagnosis not present

## 2021-04-22 DIAGNOSIS — N186 End stage renal disease: Secondary | ICD-10-CM | POA: Diagnosis not present

## 2021-04-22 DIAGNOSIS — H6123 Impacted cerumen, bilateral: Secondary | ICD-10-CM | POA: Diagnosis not present

## 2021-04-22 DIAGNOSIS — I70202 Unspecified atherosclerosis of native arteries of extremities, left leg: Secondary | ICD-10-CM | POA: Diagnosis not present

## 2021-04-22 DIAGNOSIS — D631 Anemia in chronic kidney disease: Secondary | ICD-10-CM | POA: Diagnosis not present

## 2021-04-22 DIAGNOSIS — Z7982 Long term (current) use of aspirin: Secondary | ICD-10-CM | POA: Diagnosis not present

## 2021-04-22 DIAGNOSIS — K219 Gastro-esophageal reflux disease without esophagitis: Secondary | ICD-10-CM | POA: Diagnosis not present

## 2021-04-22 DIAGNOSIS — I5033 Acute on chronic diastolic (congestive) heart failure: Secondary | ICD-10-CM | POA: Diagnosis not present

## 2021-04-22 DIAGNOSIS — E1151 Type 2 diabetes mellitus with diabetic peripheral angiopathy without gangrene: Secondary | ICD-10-CM | POA: Diagnosis not present

## 2021-04-22 DIAGNOSIS — L89890 Pressure ulcer of other site, unstageable: Secondary | ICD-10-CM | POA: Diagnosis not present

## 2021-04-22 DIAGNOSIS — I6523 Occlusion and stenosis of bilateral carotid arteries: Secondary | ICD-10-CM | POA: Diagnosis not present

## 2021-04-22 DIAGNOSIS — M8580 Other specified disorders of bone density and structure, unspecified site: Secondary | ICD-10-CM | POA: Diagnosis not present

## 2021-04-22 DIAGNOSIS — K589 Irritable bowel syndrome without diarrhea: Secondary | ICD-10-CM | POA: Diagnosis not present

## 2021-04-22 DIAGNOSIS — I132 Hypertensive heart and chronic kidney disease with heart failure and with stage 5 chronic kidney disease, or end stage renal disease: Secondary | ICD-10-CM | POA: Diagnosis not present

## 2021-04-22 DIAGNOSIS — I2510 Atherosclerotic heart disease of native coronary artery without angina pectoris: Secondary | ICD-10-CM | POA: Diagnosis not present

## 2021-04-22 DIAGNOSIS — L89620 Pressure ulcer of left heel, unstageable: Secondary | ICD-10-CM | POA: Diagnosis not present

## 2021-04-23 DIAGNOSIS — T8249XD Other complication of vascular dialysis catheter, subsequent encounter: Secondary | ICD-10-CM | POA: Diagnosis not present

## 2021-04-23 DIAGNOSIS — D631 Anemia in chronic kidney disease: Secondary | ICD-10-CM | POA: Diagnosis not present

## 2021-04-23 DIAGNOSIS — I120 Hypertensive chronic kidney disease with stage 5 chronic kidney disease or end stage renal disease: Secondary | ICD-10-CM | POA: Diagnosis not present

## 2021-04-23 DIAGNOSIS — D509 Iron deficiency anemia, unspecified: Secondary | ICD-10-CM | POA: Diagnosis not present

## 2021-04-23 DIAGNOSIS — N186 End stage renal disease: Secondary | ICD-10-CM | POA: Diagnosis not present

## 2021-04-23 DIAGNOSIS — E1165 Type 2 diabetes mellitus with hyperglycemia: Secondary | ICD-10-CM | POA: Diagnosis not present

## 2021-04-23 DIAGNOSIS — N2581 Secondary hyperparathyroidism of renal origin: Secondary | ICD-10-CM | POA: Diagnosis not present

## 2021-04-23 DIAGNOSIS — Z992 Dependence on renal dialysis: Secondary | ICD-10-CM | POA: Diagnosis not present

## 2021-04-23 DIAGNOSIS — E876 Hypokalemia: Secondary | ICD-10-CM | POA: Diagnosis not present

## 2021-04-24 ENCOUNTER — Other Ambulatory Visit: Payer: Self-pay

## 2021-04-24 ENCOUNTER — Ambulatory Visit (HOSPITAL_BASED_OUTPATIENT_CLINIC_OR_DEPARTMENT_OTHER): Payer: Medicare Other | Attending: Pulmonary Disease | Admitting: Pulmonary Disease

## 2021-04-25 ENCOUNTER — Ambulatory Visit (INDEPENDENT_AMBULATORY_CARE_PROVIDER_SITE_OTHER): Payer: Medicare Other | Admitting: Physician Assistant

## 2021-04-25 ENCOUNTER — Other Ambulatory Visit: Payer: Self-pay

## 2021-04-25 VITALS — BP 155/60 | HR 60 | Temp 98.3°F | Resp 20 | Ht 62.0 in

## 2021-04-25 DIAGNOSIS — I739 Peripheral vascular disease, unspecified: Secondary | ICD-10-CM

## 2021-04-25 DIAGNOSIS — N186 End stage renal disease: Secondary | ICD-10-CM

## 2021-04-25 DIAGNOSIS — Z992 Dependence on renal dialysis: Secondary | ICD-10-CM

## 2021-04-25 NOTE — Progress Notes (Signed)
Office Note     CC:  follow up Requesting Provider:  [name],*  HPI: Molly [name] is a 71 y.o. ([birth year]) female who presents for follow up wound check. She is s/p left common femoral to anterior tibial artery bypass with PTFE. She additionally had debridement of left great toe and heel by Dr. Stanford Breed on 03/14/21.   She was last seen on 12/12 at which time she was doing well. Left 1st toe and heel were healing. She was having some left heel discomfort. He bypass was patent by duplex and her ABI's had increased significantly post bypass.   She presents today with her husband. She reports continued soreness in left 1st toe and heel. She has been having [place] RN assist with dressing changes as well as her Husband. They have been applying Silvadene to the left toe and heel. Report some drainage on dressings to both toe and heel. She has been minimally ambulating because of soreness in heft foot. The rest of left leg feels good. She is not having any RLE symptoms   She dialyzes via right IJ TDC on Tues/ Thurs/ Sat at the [facility] location. Her left AV fistula on her duplex was patent but not fully matured and appeared deep in left upper arm. She has been working to exercise it as recommended. She has held of on superficialization secondary to having had her bypass surgery recent.   She is on Statin medication for hyperlipidemia. She also takes Zetia She is on Aspirin and Eliquis  Past Medical History:  Diagnosis Date   Abdominal aortic atherosclerosis with stenosis 06/29/2015   Abnormal LFTs    Anemia    Atopic dermatitis 04/21/2020   Bilateral impacted cerumen 08/21/2019   Cholecystitis 06/29/2015   Cholelithiasis 06/29/2015   Chronic combined systolic (congestive) and diastolic (congestive) heart failure (HCC)    Claudication in peripheral vascular disease ([place]) 08/28/2019   Peripheral arterial disease   Colon cancer screening 04/21/2020   Dehydration with  hyponatremia 06/29/2015   Diabetes mellitus ([place]) 07/15/2018   DKA (diabetic ketoacidoses) 06/29/2015   Dyslipidemia 08/30/2018   Elevated troponin    End stage renal disease (HCC)    Epigastric pain 04/21/2020   Gastroesophageal reflux disease 04/21/2020   HTN (hypertension) 06/29/2015   Hyperglycemia due to type 2 diabetes mellitus ([place]) 04/21/2020   Hypertensive emergency 07/12/2020   Hypertensive heart disease without congestive heart failure 04/21/2020   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   Irritable bowel syndrome with diarrhea 10/02/2018   Leukocytosis 06/29/2015   Long term (current) use of insulin ([place]) 04/21/2020   Loss of appetite 04/21/2020   Nausea 04/21/2020   Occlusion and stenosis of bilateral carotid arteries 04/21/2020   Osteopenia 10/02/2018   Other dysphagia 09/10/2018   Peripheral vascular disease ([place]) 07/15/2018   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   Proptosis 04/21/2020   Proximal leg weakness 09/10/2018   Ptosis of left eyelid 09/10/2018   Renal artery stenosis ([place]) 12/17/2019   Renal artery stenosis   Standard chest x-ray abnormal 04/21/2020   Vitamin D deficiency 04/21/2020   Weight loss 04/21/2020    Past Surgical History:  Procedure Laterality Date   ABDOMINAL AORTOGRAM W/LOWER EXTREMITY N/A 08/28/2019   Procedure: ABDOMINAL AORTOGRAM W/LOWER EXTREMITY;  Surgeon: Lorretta Harp, MD;  Location: [facility];  Service: Cardiovascular;  Laterality: N/A;   ABDOMINAL AORTOGRAM W/LOWER EXTREMITY Bilateral 03/07/2021   Procedure: ABDOMINAL AORTOGRAM W/LOWER  EXTREMITY;  Surgeon: Waynetta Sandy, MD;  Location: [place] [facility];  Service: Cardiovascular;  Laterality: Bilateral;   AV FISTULA PLACEMENT Left 01/10/2021   Procedure: LEFT ARM BRACHIOCEPHALIC  ARTERIOVENOUS (AV) FISTULA CREATION;  Surgeon: Cherre Robins, MD;  Location: [place];  Service:  Vascular;  Laterality: Left;   BIOPSY  01/05/2021   Procedure: BIOPSY;  Surgeon: Gatha Mayer, MD;  Location: [place];  Service: Endoscopy;;   CATARACT EXTRACTION, BILATERAL     CHOLECYSTECTOMY N/A 06/30/2015   Procedure: LAPAROSCOPIC CHOLECYSTECTOMY WITH INTRAOPERATIVE CHOLANGIOGRAM;  Surgeon: Donnie Mesa, MD;  Location: [place];  Service: General;  Laterality: N/A;   CYSTOSCOPY W/ URETERAL STENT PLACEMENT Left 12/30/2020   Procedure: CYSTOSCOPY WITH RETROGRADE PYELOGRAM/URETERAL STENT PLACEMENT;  Surgeon: Janith Lima, MD;  Location: [place];  Service: Urology;  Laterality: Left;   ESOPHAGOGASTRODUODENOSCOPY (EGD) WITH PROPOFOL N/A 01/05/2021   Procedure: ESOPHAGOGASTRODUODENOSCOPY (EGD) WITH PROPOFOL;  Surgeon: Gatha Mayer, MD;  Location: [place];  Service: Endoscopy;  Laterality: N/A;   FEMORAL-TIBIAL BYPASS GRAFT Left 03/14/2021   Procedure: LEFT common FEMORAL-ANTERIOR TIBIAL ARTERY BYPASS woth Gortex;  Surgeon: Cherre Robins, MD;  Location: [place];  Service: Vascular;  Laterality: Left;   INSERTION OF DIALYSIS CATHETER N/A 01/10/2021   Procedure: INSERTION OF TUNNELED DIALYSIS CATHETER RIGHT INTERNAL JUGULAR;  Surgeon: Cherre Robins, MD;  Location: [place];  Service: Vascular;  Laterality: N/A;   INTRAMEDULLARY (IM) NAIL INTERTROCHANTERIC Left 01/06/2021   Procedure: INTRAMEDULLARY (IM) NAIL INTERTROCHANTRIC;  Surgeon: Erle Crocker, MD;  Location: [place];  Service: Orthopedics;  Laterality: Left;   IR FLUORO GUIDE CV LINE RIGHT  01/01/2021   IR US GUIDE VASC ACCESS RIGHT  01/01/2021   PERIPHERAL VASCULAR BALLOON ANGIOPLASTY Right 08/28/2019   Procedure: PERIPHERAL VASCULAR BALLOON ANGIOPLASTY;  Surgeon: Lorretta Harp, MD;  Location: [facility];  Service: Cardiovascular;  Laterality: Right;  attempted SFA   REMOVAL OF A DIALYSIS CATHETER Right 01/10/2021   Procedure: REMOVAL OF TEMPORARY DIALYSIS CATHETER RIGHT GROIN;  Surgeon: Cherre Robins, MD;  Location:  [place];  Service: Vascular;  Laterality: Right;   THROMBECTOMY BRACHIAL ARTERY  01/10/2021   Procedure: THROMBECTOMY LEFT CEPHALIC VEIN;  Surgeon: Cherre Robins, MD;  Location: [place];  Service: Vascular;;   WOUND DEBRIDEMENT Left 03/14/2021   Procedure: LEFT FOOT DEBRIDEMENT;  Surgeon: Cherre Robins, MD;  Location: [facility];  Service: Vascular;  Laterality: Left;    Social History   Socioeconomic History   Marital status: Married    Spouse name: Nilza Eaker   Number of children: 2   Years of education: Not on file   Highest education level: Bachelor's degree (e.g., BA, AB, BS)  Occupational History   Occupation: retired    Comment: Med Tech  Tobacco Use   Smoking status: Never   Smokeless tobacco: Never  Vaping Use   Vaping Use: Never used  Substance and Sexual Activity   Alcohol use: No   Drug use: Never   Sexual activity: Not on file  Other Topics Concern   Not on file  Social History Narrative   Not on file   Social Determinants of Health   Financial Resource Strain: Low Risk    Difficulty of Paying Living Expenses: Not very hard  Food Insecurity: No Food Insecurity   Worried About Running Out of Food in the Last Year: Never true   [place] in the Last Year: Never true  Transportation Needs: No Data processing manager (Medical): No   Lack of Transportation (Non-Medical): No  Physical Activity: Not on file  Stress: Not on file  Social Connections: Not on file  Intimate Partner Violence: Not on file    Family History  Problem Relation Age of Onset   Breast cancer Mother    Hypertension Mother    Hypertension Father    Pancreatic cancer Other        uncle    Current Outpatient Medications  Medication Sig Dispense Refill   acetaminophen (TYLENOL) 325 MG tablet Take 2 tablets (650 mg total) by mouth every 4 (four) hours as needed for headache or mild pain.     acetaminophen-codeine (TYLENOL #2) 300-15 MG tablet Take 1 tablet by  mouth every 6 (six) hours as needed.     apixaban (ELIQUIS) 2.5 MG TABS tablet Take 1 tablet (2.5 mg total) by mouth 2 (two) times daily. 60 tablet 0   aspirin EC 81 MG EC tablet Take 1 tablet (81 mg total) by mouth daily at 6 (six) AM. Swallow whole. 30 tablet 11   carboxymethylcellul-glycerin (OPTIVE) 0.5-0.9 % ophthalmic solution Place 1-2 drops into both eyes 3 (three) times daily as needed for dry eyes.     carvedilol (COREG) 25 MG tablet Take 1 tablet (25 mg total) by mouth 2 (two) times daily. 180 tablet 3   Cholecalciferol 25 MCG (1000 UT) tablet Take 2,000 Units by mouth every evening.     cilostazol (PLETAL) 50 MG tablet Take 1 tablet (50 mg total) by mouth 2 (two) times daily. 180 tablet 3   dronabinol (MARINOL) 2.5 MG capsule Take 1 capsule (2.5 mg total) by mouth daily before lunch. 30 capsule 0   ezetimibe (ZETIA) 10 MG tablet Take 1 tablet (10 mg total) by mouth daily. (Patient taking differently: Take 10 mg by mouth every evening.) 90 tablet 2   famotidine (PEPCID) 20 MG tablet Take 20 mg by mouth in the morning and at bedtime.     FLUoxetine (PROZAC) 10 MG capsule Take 1 capsule (10 mg total) by mouth daily. 30 capsule 0   hydrALAZINE (APRESOLINE) 25 MG tablet Take 25 mg by mouth 3 (three) times daily.     hydrocortisone cream 1 % Apply 1 application topically 4 (four) times daily as needed for itching.     isosorbide mononitrate (IMDUR) 60 MG 24 hr tablet Take 1 tablet (60 mg total) by mouth daily. 90 tablet 1   ketoconazole (NIZORAL) 2 % cream Apply 1 application topically daily as needed for irritation.      levocetirizine (XYZAL) 5 MG tablet Take 5 mg by mouth daily as needed for allergies.     loperamide (IMODIUM) 1 MG/5ML solution Take 2 mg by mouth as needed for diarrhea or loose stools.     rosuvastatin (CRESTOR) 10 MG tablet Take 1 tablet (10 mg total) by mouth daily. (Patient taking differently: Take 10 mg by mouth every evening.) 30 tablet 0   No current  facility-administered medications for this visit.    Allergies  Allergen Reactions   Lisinopril Other (See Comments)    Hair fell out   Mercury Hives and Nausea And Vomiting     REVIEW OF SYSTEMS:   [X]  denotes positive finding, [ ]  denotes negative finding Cardiac  Comments:  Chest pain or chest pressure:    Shortness of breath upon exertion:    Short of breath when lying flat:  Irregular heart rhythm:        Vascular    Pain in calf, thigh, or hip brought on by ambulation:    Pain in feet at night that wakes you up from your sleep:     Blood clot in your veins:    Leg swelling:         Pulmonary    Oxygen at home:    Productive cough:     Wheezing:         Neurologic    Sudden weakness in arms or legs:     Sudden numbness in arms or legs:     Sudden onset of difficulty speaking or slurred speech:    Temporary loss of vision in one eye:     Problems with dizziness:         Gastrointestinal    Blood in stool:     Vomited blood:         Genitourinary    Burning when urinating:     Blood in urine:        Psychiatric    Major depression:         Hematologic    Bleeding problems:    Problems with blood clotting too easily:        Skin    Rashes or ulcers:        Constitutional    Fever or chills:      PHYSICAL EXAMINATION:  Vitals:   04/25/21 1035  BP: (!) 155/60  Pulse: 60  Resp: 20  Temp: 98.3 F (36.8 C)  TempSrc: Temporal  SpO2: 98%  Height: 5\' 2"  (1.575 m)    General:  WDWN in NAD; vital signs documented above Gait: Not observed, in wheel chair HENT: WNL, normocephalic Pulmonary: normal non-labored breathing , without wheezing Cardiac: regular HR Abdomen: soft, NT, no masses Vascular Exam/Pulses:Left AV fistula with good thrill. Easily paplable at [facility] but then becomes deep in left upper arm. 2+ left radial pulse. Hand warm and well perfused. Doppler left Dp/ PT/ Peroneal signals. Left foot warm and well perfused Extremities: without  ischemic changes, without Gangrene , without cellulitis; with open wound of left distal 1st toe and left heel. Left 1st toe with more slough and maceration. Left heel smaller than last visit and well appearing with pink granulation tissue in wound bed. Some epidermolysis surrounding wound bed. No signs of infection of either wounds      Musculoskeletal: no muscle wasting or atrophy  Neurologic: A&O X 3;  No focal weakness or paresthesias are detected Psychiatric:  The pt has Normal affect.   ASSESSMENT/PLAN:: 71 y.o. female here for follow up wound check LLE. She is s/p left common femoral to anterior tibial artery bypass with PTFE. She additionally had debridement of left great toe and heel by Dr. Stanford Breed on 03/14/21. She has left heel and 1st toe wounds that are slow to heel. Left heel wound is well appearing today. Getting smaller with pink granulation tissue. Left 1st toe with more slough and maceration today than at last visit. Instructed them on proper wound care. Recommend allowing 1st toe to get some air during the day and otherwise an use hydrogel on wound when dressed. Can continue to use silvadene on Heel with Daily dressing changes. Continue to not put pressure on left heel. She is still using right IJ [facility] for dialysis. This is working well. Has LUE AV fistula that has matured well but is too deep in the left  upper arm. Have recommended elevation in past but with lower extremity surgery they wanted to hold off until she was a little more recovered. Re discussed recommendation with patient and her husband for transposition. Their questions and concerns were addressed and she is now willing to proceed.  - She dialyzes on TTS so will try to arrange her surgery on a non dialysis day - She is on Aspirin and Eliquis so she will need to hold her Eliquis. She previously has needed Cardiac Clearance for surgery so will reach out to her Cardiologist, Dr. Audie Box for clearance -Will arrange for her to  have a Superficialization of her left BC fistula with Dr. Stanford Breed in the near future  Karoline Caldwell, [facility] [phone]  Clinic MD:   Trula Slade

## 2021-04-26 ENCOUNTER — Telehealth: Payer: Self-pay | Admitting: Cardiovascular Disease

## 2021-04-26 ENCOUNTER — Telehealth: Payer: Self-pay

## 2021-04-26 DIAGNOSIS — N186 End stage renal disease: Secondary | ICD-10-CM | POA: Diagnosis not present

## 2021-04-26 DIAGNOSIS — D509 Iron deficiency anemia, unspecified: Secondary | ICD-10-CM | POA: Diagnosis not present

## 2021-04-26 DIAGNOSIS — D631 Anemia in chronic kidney disease: Secondary | ICD-10-CM | POA: Diagnosis not present

## 2021-04-26 DIAGNOSIS — Z992 Dependence on renal dialysis: Secondary | ICD-10-CM | POA: Diagnosis not present

## 2021-04-26 DIAGNOSIS — I120 Hypertensive chronic kidney disease with stage 5 chronic kidney disease or end stage renal disease: Secondary | ICD-10-CM | POA: Diagnosis not present

## 2021-04-26 DIAGNOSIS — N2581 Secondary hyperparathyroidism of renal origin: Secondary | ICD-10-CM | POA: Diagnosis not present

## 2021-04-26 DIAGNOSIS — T8249XD Other complication of vascular dialysis catheter, subsequent encounter: Secondary | ICD-10-CM | POA: Diagnosis not present

## 2021-04-26 DIAGNOSIS — E1165 Type 2 diabetes mellitus with hyperglycemia: Secondary | ICD-10-CM | POA: Diagnosis not present

## 2021-04-26 DIAGNOSIS — E876 Hypokalemia: Secondary | ICD-10-CM | POA: Diagnosis not present

## 2021-04-26 NOTE — Telephone Encounter (Signed)
° °  Pre-operative Risk Assessment    Patient Name: Molly [name]  DOB: 07-08-1950 MRN: [id number]      Request for Surgical Clearance    Procedure:   Left Brachiocephalic Fistula   Date of Surgery:  Clearance 05/02/21                               Dr  Surgeon:  Donnelly Stager Surgeon's Group or Practice Name:  [facility] Phone number:  [phone] Fax number:  [phone]   Type of Clearance Requested:   - Medical  - Pharmacy:  Hold Aspirin and Apixaban (Eliquis)     Type of Anesthesia:  Not Indicated   Additional requests/questions:  Please advise surgeon/provider what medications should be held. Please fax a copy of clearance to the surgeon's office.  Evalee Mutton   04/26/2021, 12:40 PM

## 2021-04-26 NOTE — Telephone Encounter (Signed)
Patient with diagnosis of A Fib on Eliquis for anticoagulation.    Procedure: cystoscopy urethroscopy laser lithotripsy stent exchange Date of procedure: TBD   CHA2DS2-VASc Score = 6  This indicates a 9.7% annual risk of stroke. The patient's score is based upon: CHF History: 1 HTN History: 1 Diabetes History: 1 Stroke History: 0 Vascular Disease History: 1 Age Score: 1 Gender Score: 1   CrCl 15 mL/min  Per office protocol, patient can hold Eliquis for 2 days prior to procedure.

## 2021-04-26 NOTE — Telephone Encounter (Signed)
Patient with diagnosis of afib on Eliquis for anticoagulation.    Procedure: Left Brachiocephalic Fistula  Date of procedure: 05/02/21  CHA2DS2-VASc Score = 6   This indicates a 9.7% annual risk of stroke. The patient's score is based upon: CHF History: 1 HTN History: 1 Diabetes History: 1 Stroke History: 0 Vascular Disease History: 1 Age Score: 1 Gender Score: 1      Patient has ESRD on HD Crcl 11.2 ml/min  Per office protocol, patient can hold Eliquis for 2-3 days prior to procedure.

## 2021-04-26 NOTE — Telephone Encounter (Signed)
° °  Pre-operative Risk Assessment    Patient Name: Molly [name]  DOB: Sep 10, 1950 MRN: [id number]      Request for Surgical Clearance{ 1. What type of surgery is being performed? Enter name of procedure below and number of teeth if dental extraction.    Procedure:   cystoscopy urethroscopy laser lithotripsy stent exchange  Date of Surgery:  Clearance TBD                                 Surgeon:  DR Rexene Alberts Surgeon's Group or Practice Name:  [facility]  Phone number:  [phone] Fax number:  [phone]   Type of Clearance Requested:   - Medical   apixaban (ELIQUIS) 2.5 MG TABS tablet 3 days prior too   Type of Anesthesia:  General    Additional requests/questions:      Eston Mould   04/26/2021, 10:38 AM

## 2021-04-26 NOTE — Telephone Encounter (Signed)
° °  Primary Cardiologist: Jenne Campus, MD  Chart reviewed as part of pre-operative protocol coverage. Given past medical history and time since last visit, based on ACC/AHA guidelines, Molly [name] would be at acceptable risk for the planned procedure without further cardiovascular testing.   Her RCRI is a class III risk, 6.6% risk of major cardiac event.  Patient with diagnosis of A Fib on Eliquis for anticoagulation.     Procedure: cystoscopy urethroscopy laser lithotripsy stent exchange Date of procedure: TBD     CHA2DS2-VASc Score = 6  This indicates a 9.7% annual risk of stroke. The patient's score is based upon: CHF History: 1 HTN History: 1 Diabetes History: 1 Stroke History: 0 Vascular Disease History: 1 Age Score: 1 Gender Score: 1     CrCl 15 mL/min   Per office protocol, patient can hold Eliquis for 2 days prior to procedure.  I will route this recommendation to the requesting party via Epic fax function and remove from pre-op pool.  Please call with questions.  Jossie Ng. Ina Poupard NP-C    04/26/2021, 11:43 AM [place] [address] Office [phone] Fax [phone]

## 2021-04-27 DIAGNOSIS — L89893 Pressure ulcer of other site, stage 3: Secondary | ICD-10-CM | POA: Diagnosis not present

## 2021-04-27 DIAGNOSIS — N186 End stage renal disease: Secondary | ICD-10-CM | POA: Diagnosis not present

## 2021-04-27 DIAGNOSIS — M8580 Other specified disorders of bone density and structure, unspecified site: Secondary | ICD-10-CM | POA: Diagnosis not present

## 2021-04-27 DIAGNOSIS — J9621 Acute and chronic respiratory failure with hypoxia: Secondary | ICD-10-CM | POA: Diagnosis not present

## 2021-04-27 DIAGNOSIS — I051 Rheumatic mitral insufficiency: Secondary | ICD-10-CM | POA: Diagnosis not present

## 2021-04-27 DIAGNOSIS — D631 Anemia in chronic kidney disease: Secondary | ICD-10-CM | POA: Diagnosis not present

## 2021-04-27 DIAGNOSIS — K589 Irritable bowel syndrome without diarrhea: Secondary | ICD-10-CM | POA: Diagnosis not present

## 2021-04-27 DIAGNOSIS — K219 Gastro-esophageal reflux disease without esophagitis: Secondary | ICD-10-CM | POA: Diagnosis not present

## 2021-04-27 DIAGNOSIS — L89620 Pressure ulcer of left heel, unstageable: Secondary | ICD-10-CM | POA: Diagnosis not present

## 2021-04-27 DIAGNOSIS — I2510 Atherosclerotic heart disease of native coronary artery without angina pectoris: Secondary | ICD-10-CM | POA: Diagnosis not present

## 2021-04-27 DIAGNOSIS — I5033 Acute on chronic diastolic (congestive) heart failure: Secondary | ICD-10-CM | POA: Diagnosis not present

## 2021-04-27 DIAGNOSIS — E1122 Type 2 diabetes mellitus with diabetic chronic kidney disease: Secondary | ICD-10-CM | POA: Diagnosis not present

## 2021-04-27 DIAGNOSIS — Z7982 Long term (current) use of aspirin: Secondary | ICD-10-CM | POA: Diagnosis not present

## 2021-04-27 DIAGNOSIS — I70202 Unspecified atherosclerosis of native arteries of extremities, left leg: Secondary | ICD-10-CM | POA: Diagnosis not present

## 2021-04-27 DIAGNOSIS — J9622 Acute and chronic respiratory failure with hypercapnia: Secondary | ICD-10-CM | POA: Diagnosis not present

## 2021-04-27 DIAGNOSIS — I6523 Occlusion and stenosis of bilateral carotid arteries: Secondary | ICD-10-CM | POA: Diagnosis not present

## 2021-04-27 DIAGNOSIS — L89890 Pressure ulcer of other site, unstageable: Secondary | ICD-10-CM | POA: Diagnosis not present

## 2021-04-27 DIAGNOSIS — H6123 Impacted cerumen, bilateral: Secondary | ICD-10-CM | POA: Diagnosis not present

## 2021-04-27 DIAGNOSIS — R1319 Other dysphagia: Secondary | ICD-10-CM | POA: Diagnosis not present

## 2021-04-27 DIAGNOSIS — E785 Hyperlipidemia, unspecified: Secondary | ICD-10-CM | POA: Diagnosis not present

## 2021-04-27 DIAGNOSIS — Z7901 Long term (current) use of anticoagulants: Secondary | ICD-10-CM | POA: Diagnosis not present

## 2021-04-27 DIAGNOSIS — I701 Atherosclerosis of renal artery: Secondary | ICD-10-CM | POA: Diagnosis not present

## 2021-04-27 DIAGNOSIS — I132 Hypertensive heart and chronic kidney disease with heart failure and with stage 5 chronic kidney disease, or end stage renal disease: Secondary | ICD-10-CM | POA: Diagnosis not present

## 2021-04-27 DIAGNOSIS — E1151 Type 2 diabetes mellitus with diabetic peripheral angiopathy without gangrene: Secondary | ICD-10-CM | POA: Diagnosis not present

## 2021-04-28 ENCOUNTER — Other Ambulatory Visit: Payer: Self-pay | Admitting: Urology

## 2021-04-28 DIAGNOSIS — Z992 Dependence on renal dialysis: Secondary | ICD-10-CM | POA: Diagnosis not present

## 2021-04-28 DIAGNOSIS — E876 Hypokalemia: Secondary | ICD-10-CM | POA: Diagnosis not present

## 2021-04-28 DIAGNOSIS — E1165 Type 2 diabetes mellitus with hyperglycemia: Secondary | ICD-10-CM | POA: Diagnosis not present

## 2021-04-28 DIAGNOSIS — D509 Iron deficiency anemia, unspecified: Secondary | ICD-10-CM | POA: Diagnosis not present

## 2021-04-28 DIAGNOSIS — I120 Hypertensive chronic kidney disease with stage 5 chronic kidney disease or end stage renal disease: Secondary | ICD-10-CM | POA: Diagnosis not present

## 2021-04-28 DIAGNOSIS — D631 Anemia in chronic kidney disease: Secondary | ICD-10-CM | POA: Diagnosis not present

## 2021-04-28 DIAGNOSIS — T8249XD Other complication of vascular dialysis catheter, subsequent encounter: Secondary | ICD-10-CM | POA: Diagnosis not present

## 2021-04-28 DIAGNOSIS — N186 End stage renal disease: Secondary | ICD-10-CM | POA: Diagnosis not present

## 2021-04-28 DIAGNOSIS — N2581 Secondary hyperparathyroidism of renal origin: Secondary | ICD-10-CM | POA: Diagnosis not present

## 2021-04-28 NOTE — Telephone Encounter (Signed)
° °  Primary Cardiologist: Jenne Campus, MD  Chart reviewed as part of pre-operative protocol coverage. Given past medical history and time since last visit, based on ACC/AHA guidelines, Molly [name] would be at acceptable risk for the planned procedure without further cardiovascular testing. Guidelines regarding holding anticoagulation are as follows:   Patient with diagnosis of afib on Eliquis for anticoagulation.     Procedure: Left Brachiocephalic Fistula  Date of procedure: 05/02/21   CHA2DS2-VASc Score = 6   This indicates a 9.7% annual risk of stroke. The patient's score is based upon: CHF History: 1 HTN History: 1 Diabetes History: 1 Stroke History: 0 Vascular Disease History: 1 Age Score: 1 Gender Score: 1   Patient has ESRD on HD Crcl 11.2 ml/min Per office protocol, patient can hold Eliquis for 2-3 days prior to procedure.   Aspirin is prescribed by vascular surgery, not cardiologist.   Patient was advised that if she develops new symptoms prior to surgery to contact our office to arrange a follow-up appointment.  He verbalized understanding.  I will route this recommendation to the requesting party via Epic fax function and remove from pre-op pool.  Please call with questions. Emmaline Life, NP-C    04/28/2021, 10:02 AM [place] [address], [address] Office [phone] Fax [phone]

## 2021-04-29 ENCOUNTER — Telehealth: Payer: Self-pay

## 2021-04-29 NOTE — Telephone Encounter (Signed)
Received call from patient stating she doesn't think she is ready to have surgery on Monday. Canceled fistula superficialization and advised patient to contact office when ready to reschedule.

## 2021-04-30 DIAGNOSIS — N186 End stage renal disease: Secondary | ICD-10-CM | POA: Diagnosis not present

## 2021-04-30 DIAGNOSIS — D509 Iron deficiency anemia, unspecified: Secondary | ICD-10-CM | POA: Diagnosis not present

## 2021-04-30 DIAGNOSIS — D631 Anemia in chronic kidney disease: Secondary | ICD-10-CM | POA: Diagnosis not present

## 2021-04-30 DIAGNOSIS — N2581 Secondary hyperparathyroidism of renal origin: Secondary | ICD-10-CM | POA: Diagnosis not present

## 2021-04-30 DIAGNOSIS — Z992 Dependence on renal dialysis: Secondary | ICD-10-CM | POA: Diagnosis not present

## 2021-04-30 DIAGNOSIS — T8249XD Other complication of vascular dialysis catheter, subsequent encounter: Secondary | ICD-10-CM | POA: Diagnosis not present

## 2021-04-30 DIAGNOSIS — I120 Hypertensive chronic kidney disease with stage 5 chronic kidney disease or end stage renal disease: Secondary | ICD-10-CM | POA: Diagnosis not present

## 2021-04-30 DIAGNOSIS — E1165 Type 2 diabetes mellitus with hyperglycemia: Secondary | ICD-10-CM | POA: Diagnosis not present

## 2021-04-30 DIAGNOSIS — E876 Hypokalemia: Secondary | ICD-10-CM | POA: Diagnosis not present

## 2021-05-02 ENCOUNTER — Ambulatory Visit (HOSPITAL_COMMUNITY): Admission: RE | Admit: 2021-05-02 | Payer: Medicare Other | Source: Home / Self Care | Admitting: Vascular Surgery

## 2021-05-02 ENCOUNTER — Encounter (HOSPITAL_COMMUNITY): Admission: RE | Payer: Self-pay | Source: Home / Self Care

## 2021-05-02 SURGERY — FISTULA SUPERFICIALIZATION
Anesthesia: Monitor Anesthesia Care | Laterality: Left

## 2021-05-03 DIAGNOSIS — Z992 Dependence on renal dialysis: Secondary | ICD-10-CM | POA: Diagnosis not present

## 2021-05-03 DIAGNOSIS — E876 Hypokalemia: Secondary | ICD-10-CM | POA: Diagnosis not present

## 2021-05-03 DIAGNOSIS — D509 Iron deficiency anemia, unspecified: Secondary | ICD-10-CM | POA: Diagnosis not present

## 2021-05-03 DIAGNOSIS — D631 Anemia in chronic kidney disease: Secondary | ICD-10-CM | POA: Diagnosis not present

## 2021-05-03 DIAGNOSIS — N2581 Secondary hyperparathyroidism of renal origin: Secondary | ICD-10-CM | POA: Diagnosis not present

## 2021-05-03 DIAGNOSIS — I120 Hypertensive chronic kidney disease with stage 5 chronic kidney disease or end stage renal disease: Secondary | ICD-10-CM | POA: Diagnosis not present

## 2021-05-03 DIAGNOSIS — N186 End stage renal disease: Secondary | ICD-10-CM | POA: Diagnosis not present

## 2021-05-03 DIAGNOSIS — E1165 Type 2 diabetes mellitus with hyperglycemia: Secondary | ICD-10-CM | POA: Diagnosis not present

## 2021-05-03 DIAGNOSIS — T8249XD Other complication of vascular dialysis catheter, subsequent encounter: Secondary | ICD-10-CM | POA: Diagnosis not present

## 2021-05-04 DIAGNOSIS — I132 Hypertensive heart and chronic kidney disease with heart failure and with stage 5 chronic kidney disease, or end stage renal disease: Secondary | ICD-10-CM | POA: Diagnosis not present

## 2021-05-04 DIAGNOSIS — E1151 Type 2 diabetes mellitus with diabetic peripheral angiopathy without gangrene: Secondary | ICD-10-CM | POA: Diagnosis not present

## 2021-05-04 DIAGNOSIS — J9621 Acute and chronic respiratory failure with hypoxia: Secondary | ICD-10-CM | POA: Diagnosis not present

## 2021-05-04 DIAGNOSIS — E1122 Type 2 diabetes mellitus with diabetic chronic kidney disease: Secondary | ICD-10-CM | POA: Diagnosis not present

## 2021-05-04 DIAGNOSIS — R1319 Other dysphagia: Secondary | ICD-10-CM | POA: Diagnosis not present

## 2021-05-04 DIAGNOSIS — Z7901 Long term (current) use of anticoagulants: Secondary | ICD-10-CM | POA: Diagnosis not present

## 2021-05-04 DIAGNOSIS — L89893 Pressure ulcer of other site, stage 3: Secondary | ICD-10-CM | POA: Diagnosis not present

## 2021-05-04 DIAGNOSIS — L89620 Pressure ulcer of left heel, unstageable: Secondary | ICD-10-CM | POA: Diagnosis not present

## 2021-05-04 DIAGNOSIS — D631 Anemia in chronic kidney disease: Secondary | ICD-10-CM | POA: Diagnosis not present

## 2021-05-04 DIAGNOSIS — E785 Hyperlipidemia, unspecified: Secondary | ICD-10-CM | POA: Diagnosis not present

## 2021-05-04 DIAGNOSIS — Z7982 Long term (current) use of aspirin: Secondary | ICD-10-CM | POA: Diagnosis not present

## 2021-05-04 DIAGNOSIS — M8580 Other specified disorders of bone density and structure, unspecified site: Secondary | ICD-10-CM | POA: Diagnosis not present

## 2021-05-04 DIAGNOSIS — K219 Gastro-esophageal reflux disease without esophagitis: Secondary | ICD-10-CM | POA: Diagnosis not present

## 2021-05-04 DIAGNOSIS — I70202 Unspecified atherosclerosis of native arteries of extremities, left leg: Secondary | ICD-10-CM | POA: Diagnosis not present

## 2021-05-04 DIAGNOSIS — I701 Atherosclerosis of renal artery: Secondary | ICD-10-CM | POA: Diagnosis not present

## 2021-05-04 DIAGNOSIS — J9622 Acute and chronic respiratory failure with hypercapnia: Secondary | ICD-10-CM | POA: Diagnosis not present

## 2021-05-04 DIAGNOSIS — K589 Irritable bowel syndrome without diarrhea: Secondary | ICD-10-CM | POA: Diagnosis not present

## 2021-05-04 DIAGNOSIS — I2510 Atherosclerotic heart disease of native coronary artery without angina pectoris: Secondary | ICD-10-CM | POA: Diagnosis not present

## 2021-05-04 DIAGNOSIS — I5033 Acute on chronic diastolic (congestive) heart failure: Secondary | ICD-10-CM | POA: Diagnosis not present

## 2021-05-04 DIAGNOSIS — H6123 Impacted cerumen, bilateral: Secondary | ICD-10-CM | POA: Diagnosis not present

## 2021-05-04 DIAGNOSIS — I6523 Occlusion and stenosis of bilateral carotid arteries: Secondary | ICD-10-CM | POA: Diagnosis not present

## 2021-05-04 DIAGNOSIS — L89890 Pressure ulcer of other site, unstageable: Secondary | ICD-10-CM | POA: Diagnosis not present

## 2021-05-04 DIAGNOSIS — I051 Rheumatic mitral insufficiency: Secondary | ICD-10-CM | POA: Diagnosis not present

## 2021-05-04 DIAGNOSIS — N186 End stage renal disease: Secondary | ICD-10-CM | POA: Diagnosis not present

## 2021-05-05 DIAGNOSIS — N2581 Secondary hyperparathyroidism of renal origin: Secondary | ICD-10-CM | POA: Diagnosis not present

## 2021-05-05 DIAGNOSIS — N186 End stage renal disease: Secondary | ICD-10-CM | POA: Diagnosis not present

## 2021-05-05 DIAGNOSIS — E1165 Type 2 diabetes mellitus with hyperglycemia: Secondary | ICD-10-CM | POA: Diagnosis not present

## 2021-05-05 DIAGNOSIS — E876 Hypokalemia: Secondary | ICD-10-CM | POA: Diagnosis not present

## 2021-05-05 DIAGNOSIS — Z992 Dependence on renal dialysis: Secondary | ICD-10-CM | POA: Diagnosis not present

## 2021-05-05 DIAGNOSIS — D631 Anemia in chronic kidney disease: Secondary | ICD-10-CM | POA: Diagnosis not present

## 2021-05-05 DIAGNOSIS — D509 Iron deficiency anemia, unspecified: Secondary | ICD-10-CM | POA: Diagnosis not present

## 2021-05-05 DIAGNOSIS — I120 Hypertensive chronic kidney disease with stage 5 chronic kidney disease or end stage renal disease: Secondary | ICD-10-CM | POA: Diagnosis not present

## 2021-05-05 DIAGNOSIS — T8249XD Other complication of vascular dialysis catheter, subsequent encounter: Secondary | ICD-10-CM | POA: Diagnosis not present

## 2021-05-06 ENCOUNTER — Other Ambulatory Visit: Payer: Self-pay | Admitting: Physician Assistant

## 2021-05-06 ENCOUNTER — Other Ambulatory Visit: Payer: Self-pay | Admitting: *Deleted

## 2021-05-06 DIAGNOSIS — H6123 Impacted cerumen, bilateral: Secondary | ICD-10-CM | POA: Diagnosis not present

## 2021-05-06 DIAGNOSIS — K219 Gastro-esophageal reflux disease without esophagitis: Secondary | ICD-10-CM | POA: Diagnosis not present

## 2021-05-06 DIAGNOSIS — I6523 Occlusion and stenosis of bilateral carotid arteries: Secondary | ICD-10-CM | POA: Diagnosis not present

## 2021-05-06 DIAGNOSIS — I701 Atherosclerosis of renal artery: Secondary | ICD-10-CM | POA: Diagnosis not present

## 2021-05-06 DIAGNOSIS — K589 Irritable bowel syndrome without diarrhea: Secondary | ICD-10-CM | POA: Diagnosis not present

## 2021-05-06 DIAGNOSIS — Z7982 Long term (current) use of aspirin: Secondary | ICD-10-CM | POA: Diagnosis not present

## 2021-05-06 DIAGNOSIS — L89620 Pressure ulcer of left heel, unstageable: Secondary | ICD-10-CM | POA: Diagnosis not present

## 2021-05-06 DIAGNOSIS — E1151 Type 2 diabetes mellitus with diabetic peripheral angiopathy without gangrene: Secondary | ICD-10-CM | POA: Diagnosis not present

## 2021-05-06 DIAGNOSIS — E1122 Type 2 diabetes mellitus with diabetic chronic kidney disease: Secondary | ICD-10-CM | POA: Diagnosis not present

## 2021-05-06 DIAGNOSIS — M8580 Other specified disorders of bone density and structure, unspecified site: Secondary | ICD-10-CM | POA: Diagnosis not present

## 2021-05-06 DIAGNOSIS — D631 Anemia in chronic kidney disease: Secondary | ICD-10-CM | POA: Diagnosis not present

## 2021-05-06 DIAGNOSIS — I5033 Acute on chronic diastolic (congestive) heart failure: Secondary | ICD-10-CM | POA: Diagnosis not present

## 2021-05-06 DIAGNOSIS — J9621 Acute and chronic respiratory failure with hypoxia: Secondary | ICD-10-CM | POA: Diagnosis not present

## 2021-05-06 DIAGNOSIS — Z780 Asymptomatic menopausal state: Secondary | ICD-10-CM

## 2021-05-06 DIAGNOSIS — E785 Hyperlipidemia, unspecified: Secondary | ICD-10-CM | POA: Diagnosis not present

## 2021-05-06 DIAGNOSIS — J9622 Acute and chronic respiratory failure with hypercapnia: Secondary | ICD-10-CM | POA: Diagnosis not present

## 2021-05-06 DIAGNOSIS — L89890 Pressure ulcer of other site, unstageable: Secondary | ICD-10-CM | POA: Diagnosis not present

## 2021-05-06 DIAGNOSIS — R1319 Other dysphagia: Secondary | ICD-10-CM | POA: Diagnosis not present

## 2021-05-06 DIAGNOSIS — I132 Hypertensive heart and chronic kidney disease with heart failure and with stage 5 chronic kidney disease, or end stage renal disease: Secondary | ICD-10-CM | POA: Diagnosis not present

## 2021-05-06 DIAGNOSIS — L89893 Pressure ulcer of other site, stage 3: Secondary | ICD-10-CM | POA: Diagnosis not present

## 2021-05-06 DIAGNOSIS — I051 Rheumatic mitral insufficiency: Secondary | ICD-10-CM | POA: Diagnosis not present

## 2021-05-06 DIAGNOSIS — Z7901 Long term (current) use of anticoagulants: Secondary | ICD-10-CM | POA: Diagnosis not present

## 2021-05-06 DIAGNOSIS — I2510 Atherosclerotic heart disease of native coronary artery without angina pectoris: Secondary | ICD-10-CM | POA: Diagnosis not present

## 2021-05-06 DIAGNOSIS — I70202 Unspecified atherosclerosis of native arteries of extremities, left leg: Secondary | ICD-10-CM | POA: Diagnosis not present

## 2021-05-06 DIAGNOSIS — N186 End stage renal disease: Secondary | ICD-10-CM | POA: Diagnosis not present

## 2021-05-07 ENCOUNTER — Emergency Department (HOSPITAL_COMMUNITY): Payer: Medicare Other | Admitting: Certified Registered"

## 2021-05-07 ENCOUNTER — Other Ambulatory Visit: Payer: Self-pay

## 2021-05-07 ENCOUNTER — Encounter (HOSPITAL_COMMUNITY): Payer: Self-pay | Admitting: Emergency Medicine

## 2021-05-07 ENCOUNTER — Emergency Department (HOSPITAL_COMMUNITY): Payer: Medicare Other

## 2021-05-07 ENCOUNTER — Encounter (HOSPITAL_COMMUNITY): Admission: EM | Disposition: E | Payer: Self-pay | Source: Home / Self Care | Attending: Neurosurgery

## 2021-05-07 ENCOUNTER — Inpatient Hospital Stay (HOSPITAL_COMMUNITY)
Admission: EM | Admit: 2021-05-07 | Discharge: 2021-05-18 | DRG: 025 | Disposition: E | Payer: Medicare Other | Attending: Neurosurgery | Admitting: Neurosurgery

## 2021-05-07 DIAGNOSIS — A419 Sepsis, unspecified organism: Secondary | ICD-10-CM | POA: Diagnosis not present

## 2021-05-07 DIAGNOSIS — I517 Cardiomegaly: Secondary | ICD-10-CM | POA: Diagnosis not present

## 2021-05-07 DIAGNOSIS — R0902 Hypoxemia: Secondary | ICD-10-CM | POA: Diagnosis not present

## 2021-05-07 DIAGNOSIS — G931 Anoxic brain damage, not elsewhere classified: Secondary | ICD-10-CM | POA: Diagnosis not present

## 2021-05-07 DIAGNOSIS — I509 Heart failure, unspecified: Secondary | ICD-10-CM | POA: Diagnosis not present

## 2021-05-07 DIAGNOSIS — R6889 Other general symptoms and signs: Secondary | ICD-10-CM | POA: Diagnosis not present

## 2021-05-07 DIAGNOSIS — I5032 Chronic diastolic (congestive) heart failure: Secondary | ICD-10-CM | POA: Diagnosis not present

## 2021-05-07 DIAGNOSIS — R402112 Coma scale, eyes open, never, at arrival to emergency department: Secondary | ICD-10-CM | POA: Diagnosis present

## 2021-05-07 DIAGNOSIS — S20212A Contusion of left front wall of thorax, initial encounter: Secondary | ICD-10-CM | POA: Diagnosis present

## 2021-05-07 DIAGNOSIS — Z20822 Contact with and (suspected) exposure to covid-19: Secondary | ICD-10-CM | POA: Diagnosis not present

## 2021-05-07 DIAGNOSIS — G4733 Obstructive sleep apnea (adult) (pediatric): Secondary | ICD-10-CM | POA: Diagnosis present

## 2021-05-07 DIAGNOSIS — W01198A Fall on same level from slipping, tripping and stumbling with subsequent striking against other object, initial encounter: Secondary | ICD-10-CM | POA: Diagnosis present

## 2021-05-07 DIAGNOSIS — I1311 Hypertensive heart and chronic kidney disease without heart failure, with stage 5 chronic kidney disease, or end stage renal disease: Secondary | ICD-10-CM | POA: Diagnosis present

## 2021-05-07 DIAGNOSIS — G9741 Accidental puncture or laceration of dura during a procedure: Secondary | ICD-10-CM | POA: Diagnosis present

## 2021-05-07 DIAGNOSIS — R55 Syncope and collapse: Secondary | ICD-10-CM | POA: Diagnosis present

## 2021-05-07 DIAGNOSIS — E876 Hypokalemia: Secondary | ICD-10-CM | POA: Diagnosis not present

## 2021-05-07 DIAGNOSIS — K3189 Other diseases of stomach and duodenum: Secondary | ICD-10-CM | POA: Diagnosis not present

## 2021-05-07 DIAGNOSIS — J690 Pneumonitis due to inhalation of food and vomit: Secondary | ICD-10-CM | POA: Diagnosis not present

## 2021-05-07 DIAGNOSIS — N185 Chronic kidney disease, stage 5: Secondary | ICD-10-CM | POA: Diagnosis not present

## 2021-05-07 DIAGNOSIS — N2581 Secondary hyperparathyroidism of renal origin: Secondary | ICD-10-CM | POA: Diagnosis not present

## 2021-05-07 DIAGNOSIS — S299XXA Unspecified injury of thorax, initial encounter: Secondary | ICD-10-CM | POA: Diagnosis not present

## 2021-05-07 DIAGNOSIS — Z992 Dependence on renal dialysis: Secondary | ICD-10-CM

## 2021-05-07 DIAGNOSIS — G935 Compression of brain: Secondary | ICD-10-CM | POA: Diagnosis not present

## 2021-05-07 DIAGNOSIS — I10 Essential (primary) hypertension: Secondary | ICD-10-CM | POA: Diagnosis present

## 2021-05-07 DIAGNOSIS — E1122 Type 2 diabetes mellitus with diabetic chronic kidney disease: Secondary | ICD-10-CM | POA: Diagnosis present

## 2021-05-07 DIAGNOSIS — Z01818 Encounter for other preprocedural examination: Secondary | ICD-10-CM

## 2021-05-07 DIAGNOSIS — I120 Hypertensive chronic kidney disease with stage 5 chronic kidney disease or end stage renal disease: Secondary | ICD-10-CM | POA: Diagnosis not present

## 2021-05-07 DIAGNOSIS — T68XXXA Hypothermia, initial encounter: Secondary | ICD-10-CM | POA: Diagnosis not present

## 2021-05-07 DIAGNOSIS — S3991XA Unspecified injury of abdomen, initial encounter: Secondary | ICD-10-CM | POA: Diagnosis not present

## 2021-05-07 DIAGNOSIS — N186 End stage renal disease: Secondary | ICD-10-CM | POA: Diagnosis present

## 2021-05-07 DIAGNOSIS — R578 Other shock: Secondary | ICD-10-CM | POA: Diagnosis not present

## 2021-05-07 DIAGNOSIS — E11641 Type 2 diabetes mellitus with hypoglycemia with coma: Secondary | ICD-10-CM | POA: Diagnosis not present

## 2021-05-07 DIAGNOSIS — R32 Unspecified urinary incontinence: Secondary | ICD-10-CM | POA: Diagnosis present

## 2021-05-07 DIAGNOSIS — K580 Irritable bowel syndrome with diarrhea: Secondary | ICD-10-CM | POA: Diagnosis not present

## 2021-05-07 DIAGNOSIS — Z515 Encounter for palliative care: Secondary | ICD-10-CM | POA: Diagnosis not present

## 2021-05-07 DIAGNOSIS — E1165 Type 2 diabetes mellitus with hyperglycemia: Secondary | ICD-10-CM | POA: Diagnosis not present

## 2021-05-07 DIAGNOSIS — R6521 Severe sepsis with septic shock: Secondary | ICD-10-CM | POA: Diagnosis not present

## 2021-05-07 DIAGNOSIS — Z4659 Encounter for fitting and adjustment of other gastrointestinal appliance and device: Secondary | ICD-10-CM

## 2021-05-07 DIAGNOSIS — G9341 Metabolic encephalopathy: Secondary | ICD-10-CM | POA: Diagnosis present

## 2021-05-07 DIAGNOSIS — D631 Anemia in chronic kidney disease: Secondary | ICD-10-CM | POA: Diagnosis not present

## 2021-05-07 DIAGNOSIS — J9811 Atelectasis: Secondary | ICD-10-CM | POA: Diagnosis not present

## 2021-05-07 DIAGNOSIS — Z978 Presence of other specified devices: Secondary | ICD-10-CM

## 2021-05-07 DIAGNOSIS — Z7982 Long term (current) use of aspirin: Secondary | ICD-10-CM

## 2021-05-07 DIAGNOSIS — I2510 Atherosclerotic heart disease of native coronary artery without angina pectoris: Secondary | ICD-10-CM | POA: Diagnosis present

## 2021-05-07 DIAGNOSIS — E873 Alkalosis: Secondary | ICD-10-CM | POA: Diagnosis not present

## 2021-05-07 DIAGNOSIS — S061X0A Traumatic cerebral edema without loss of consciousness, initial encounter: Secondary | ICD-10-CM | POA: Diagnosis not present

## 2021-05-07 DIAGNOSIS — Z743 Need for continuous supervision: Secondary | ICD-10-CM | POA: Diagnosis not present

## 2021-05-07 DIAGNOSIS — I739 Peripheral vascular disease, unspecified: Secondary | ICD-10-CM | POA: Diagnosis not present

## 2021-05-07 DIAGNOSIS — S065XAA Traumatic subdural hemorrhage with loss of consciousness status unknown, initial encounter: Secondary | ICD-10-CM | POA: Diagnosis not present

## 2021-05-07 DIAGNOSIS — R0600 Dyspnea, unspecified: Secondary | ICD-10-CM | POA: Diagnosis not present

## 2021-05-07 DIAGNOSIS — J811 Chronic pulmonary edema: Secondary | ICD-10-CM | POA: Diagnosis not present

## 2021-05-07 DIAGNOSIS — S066X0A Traumatic subarachnoid hemorrhage without loss of consciousness, initial encounter: Secondary | ICD-10-CM | POA: Diagnosis not present

## 2021-05-07 DIAGNOSIS — R579 Shock, unspecified: Secondary | ICD-10-CM | POA: Diagnosis not present

## 2021-05-07 DIAGNOSIS — S0083XA Contusion of other part of head, initial encounter: Secondary | ICD-10-CM | POA: Diagnosis present

## 2021-05-07 DIAGNOSIS — R404 Transient alteration of awareness: Secondary | ICD-10-CM | POA: Diagnosis not present

## 2021-05-07 DIAGNOSIS — I468 Cardiac arrest due to other underlying condition: Secondary | ICD-10-CM | POA: Diagnosis not present

## 2021-05-07 DIAGNOSIS — I4891 Unspecified atrial fibrillation: Secondary | ICD-10-CM | POA: Diagnosis present

## 2021-05-07 DIAGNOSIS — J9691 Respiratory failure, unspecified with hypoxia: Secondary | ICD-10-CM

## 2021-05-07 DIAGNOSIS — R0603 Acute respiratory distress: Secondary | ICD-10-CM | POA: Diagnosis not present

## 2021-05-07 DIAGNOSIS — J9601 Acute respiratory failure with hypoxia: Secondary | ICD-10-CM | POA: Diagnosis present

## 2021-05-07 DIAGNOSIS — S065X0A Traumatic subdural hemorrhage without loss of consciousness, initial encounter: Secondary | ICD-10-CM | POA: Diagnosis not present

## 2021-05-07 DIAGNOSIS — R464 Slowness and poor responsiveness: Secondary | ICD-10-CM | POA: Diagnosis not present

## 2021-05-07 DIAGNOSIS — D6832 Hemorrhagic disorder due to extrinsic circulating anticoagulants: Secondary | ICD-10-CM | POA: Diagnosis present

## 2021-05-07 DIAGNOSIS — D509 Iron deficiency anemia, unspecified: Secondary | ICD-10-CM | POA: Diagnosis not present

## 2021-05-07 DIAGNOSIS — I700 Atherosclerosis of aorta: Secondary | ICD-10-CM | POA: Diagnosis not present

## 2021-05-07 DIAGNOSIS — T45515A Adverse effect of anticoagulants, initial encounter: Secondary | ICD-10-CM | POA: Diagnosis present

## 2021-05-07 DIAGNOSIS — Z66 Do not resuscitate: Secondary | ICD-10-CM | POA: Diagnosis not present

## 2021-05-07 DIAGNOSIS — E43 Unspecified severe protein-calorie malnutrition: Secondary | ICD-10-CM | POA: Diagnosis not present

## 2021-05-07 DIAGNOSIS — I161 Hypertensive emergency: Secondary | ICD-10-CM | POA: Diagnosis present

## 2021-05-07 DIAGNOSIS — R402322 Coma scale, best motor response, extension, at arrival to emergency department: Secondary | ICD-10-CM | POA: Diagnosis present

## 2021-05-07 DIAGNOSIS — Z7189 Other specified counseling: Secondary | ICD-10-CM | POA: Diagnosis not present

## 2021-05-07 DIAGNOSIS — T8249XD Other complication of vascular dialysis catheter, subsequent encounter: Secondary | ICD-10-CM | POA: Diagnosis not present

## 2021-05-07 DIAGNOSIS — R58 Hemorrhage, not elsewhere classified: Secondary | ICD-10-CM | POA: Diagnosis not present

## 2021-05-07 DIAGNOSIS — I132 Hypertensive heart and chronic kidney disease with heart failure and with stage 5 chronic kidney disease, or end stage renal disease: Secondary | ICD-10-CM | POA: Diagnosis not present

## 2021-05-07 DIAGNOSIS — Z7901 Long term (current) use of anticoagulants: Secondary | ICD-10-CM

## 2021-05-07 DIAGNOSIS — G9389 Other specified disorders of brain: Secondary | ICD-10-CM | POA: Diagnosis not present

## 2021-05-07 DIAGNOSIS — I469 Cardiac arrest, cause unspecified: Secondary | ICD-10-CM | POA: Diagnosis not present

## 2021-05-07 DIAGNOSIS — Z9109 Other allergy status, other than to drugs and biological substances: Secondary | ICD-10-CM

## 2021-05-07 DIAGNOSIS — N250 Renal osteodystrophy: Secondary | ICD-10-CM | POA: Diagnosis not present

## 2021-05-07 DIAGNOSIS — S0511XA Contusion of eyeball and orbital tissues, right eye, initial encounter: Secondary | ICD-10-CM | POA: Diagnosis present

## 2021-05-07 DIAGNOSIS — Y92013 Bedroom of single-family (private) house as the place of occurrence of the external cause: Secondary | ICD-10-CM

## 2021-05-07 DIAGNOSIS — E1151 Type 2 diabetes mellitus with diabetic peripheral angiopathy without gangrene: Secondary | ICD-10-CM | POA: Diagnosis present

## 2021-05-07 DIAGNOSIS — I6200 Nontraumatic subdural hemorrhage, unspecified: Secondary | ICD-10-CM | POA: Diagnosis not present

## 2021-05-07 DIAGNOSIS — K7200 Acute and subacute hepatic failure without coma: Secondary | ICD-10-CM | POA: Diagnosis not present

## 2021-05-07 DIAGNOSIS — J9602 Acute respiratory failure with hypercapnia: Secondary | ICD-10-CM | POA: Diagnosis not present

## 2021-05-07 DIAGNOSIS — S3993XA Unspecified injury of pelvis, initial encounter: Secondary | ICD-10-CM | POA: Diagnosis not present

## 2021-05-07 DIAGNOSIS — R079 Chest pain, unspecified: Secondary | ICD-10-CM | POA: Diagnosis not present

## 2021-05-07 DIAGNOSIS — T17918A Gastric contents in respiratory tract, part unspecified causing other injury, initial encounter: Secondary | ICD-10-CM | POA: Diagnosis not present

## 2021-05-07 DIAGNOSIS — Z09 Encounter for follow-up examination after completed treatment for conditions other than malignant neoplasm: Secondary | ICD-10-CM

## 2021-05-07 DIAGNOSIS — J384 Edema of larynx: Secondary | ICD-10-CM | POA: Diagnosis present

## 2021-05-07 DIAGNOSIS — Z6822 Body mass index (BMI) 22.0-22.9, adult: Secondary | ICD-10-CM

## 2021-05-07 DIAGNOSIS — J9690 Respiratory failure, unspecified, unspecified whether with hypoxia or hypercapnia: Secondary | ICD-10-CM | POA: Diagnosis not present

## 2021-05-07 DIAGNOSIS — Z452 Encounter for adjustment and management of vascular access device: Secondary | ICD-10-CM | POA: Diagnosis not present

## 2021-05-07 DIAGNOSIS — R4189 Other symptoms and signs involving cognitive functions and awareness: Secondary | ICD-10-CM

## 2021-05-07 DIAGNOSIS — T1490XA Injury, unspecified, initial encounter: Secondary | ICD-10-CM

## 2021-05-07 DIAGNOSIS — S199XXA Unspecified injury of neck, initial encounter: Secondary | ICD-10-CM | POA: Diagnosis not present

## 2021-05-07 DIAGNOSIS — R402212 Coma scale, best verbal response, none, at arrival to emergency department: Secondary | ICD-10-CM | POA: Diagnosis present

## 2021-05-07 DIAGNOSIS — S0993XA Unspecified injury of face, initial encounter: Secondary | ICD-10-CM | POA: Diagnosis not present

## 2021-05-07 DIAGNOSIS — R54 Age-related physical debility: Secondary | ICD-10-CM | POA: Diagnosis not present

## 2021-05-07 DIAGNOSIS — H5704 Mydriasis: Secondary | ICD-10-CM | POA: Diagnosis present

## 2021-05-07 DIAGNOSIS — Z4682 Encounter for fitting and adjustment of non-vascular catheter: Secondary | ICD-10-CM | POA: Diagnosis not present

## 2021-05-07 DIAGNOSIS — Z79899 Other long term (current) drug therapy: Secondary | ICD-10-CM

## 2021-05-07 HISTORY — DX: Type 2 diabetes mellitus without complications: E11.9

## 2021-05-07 HISTORY — DX: Irritable bowel syndrome without diarrhea: K58.9

## 2021-05-07 HISTORY — DX: Non-pressure chronic ulcer of unspecified part of unspecified lower leg with unspecified severity: L97.909

## 2021-05-07 HISTORY — DX: End stage renal disease: N18.6

## 2021-05-07 HISTORY — DX: Essential (primary) hypertension: I10

## 2021-05-07 HISTORY — PX: CRANIOTOMY: SHX93

## 2021-05-07 HISTORY — DX: Irritable bowel syndrome, unspecified: K58.9

## 2021-05-07 LAB — RESP PANEL BY RT-PCR (FLU A&B, COVID) ARPGX2
Influenza A by PCR: NEGATIVE
Influenza B by PCR: NEGATIVE
SARS Coronavirus 2 by RT PCR: NEGATIVE

## 2021-05-07 LAB — POCT I-STAT 7, (LYTES, BLD GAS, ICA,H+H)
Acid-Base Excess: 5 mmol/L — ABNORMAL HIGH (ref 0.0–2.0)
Bicarbonate: 26.7 mmol/L (ref 20.0–28.0)
Calcium, Ion: 1.08 mmol/L — ABNORMAL LOW (ref 1.15–1.40)
HCT: 36 % (ref 36.0–46.0)
Hemoglobin: 12.2 g/dL (ref 12.0–15.0)
O2 Saturation: 100 %
Patient temperature: 34.7
Potassium: 3.1 mmol/L — ABNORMAL LOW (ref 3.5–5.1)
Sodium: 134 mmol/L — ABNORMAL LOW (ref 135–145)
TCO2: 28 mmol/L (ref 22–32)
pCO2 arterial: 28.1 mmHg — ABNORMAL LOW (ref 32.0–48.0)
pH, Arterial: 7.578 — ABNORMAL HIGH (ref 7.350–7.450)
pO2, Arterial: 241 mmHg — ABNORMAL HIGH (ref 83.0–108.0)

## 2021-05-07 LAB — CBC
HCT: 42.6 % (ref 36.0–46.0)
Hemoglobin: 13.1 g/dL (ref 12.0–15.0)
MCH: 27.3 pg (ref 26.0–34.0)
MCHC: 30.8 g/dL (ref 30.0–36.0)
MCV: 88.9 fL (ref 80.0–100.0)
Platelets: 187 10*3/uL (ref 150–400)
RBC: 4.79 MIL/uL (ref 3.87–5.11)
RDW: 15.5 % (ref 11.5–15.5)
WBC: 6.1 10*3/uL (ref 4.0–10.5)
nRBC: 0 % (ref 0.0–0.2)

## 2021-05-07 LAB — PROTIME-INR
INR: 1.6 — ABNORMAL HIGH (ref 0.8–1.2)
Prothrombin Time: 18.7 seconds — ABNORMAL HIGH (ref 11.4–15.2)

## 2021-05-07 LAB — URINALYSIS, ROUTINE W REFLEX MICROSCOPIC
Bilirubin Urine: NEGATIVE
Glucose, UA: 150 mg/dL — AB
Ketones, ur: 5 mg/dL — AB
Leukocytes,Ua: NEGATIVE
Nitrite: NEGATIVE
Protein, ur: 100 mg/dL — AB
Specific Gravity, Urine: 1.012 (ref 1.005–1.030)
pH: 8 (ref 5.0–8.0)

## 2021-05-07 LAB — I-STAT CHEM 8, ED
BUN: 14 mg/dL (ref 8–23)
Calcium, Ion: 1.05 mmol/L — ABNORMAL LOW (ref 1.15–1.40)
Chloride: 96 mmol/L — ABNORMAL LOW (ref 98–111)
Creatinine, Ser: 1.6 mg/dL — ABNORMAL HIGH (ref 0.44–1.00)
Glucose, Bld: 213 mg/dL — ABNORMAL HIGH (ref 70–99)
HCT: 46 % (ref 36.0–46.0)
Hemoglobin: 15.6 g/dL — ABNORMAL HIGH (ref 12.0–15.0)
Potassium: 4.1 mmol/L (ref 3.5–5.1)
Sodium: 135 mmol/L (ref 135–145)
TCO2: 31 mmol/L (ref 22–32)

## 2021-05-07 LAB — LACTIC ACID, PLASMA: Lactic Acid, Venous: 2 mmol/L (ref 0.5–1.9)

## 2021-05-07 LAB — GLUCOSE, CAPILLARY: Glucose-Capillary: 151 mg/dL — ABNORMAL HIGH (ref 70–99)

## 2021-05-07 LAB — ETHANOL: Alcohol, Ethyl (B): <10 mg/dL (ref <10)

## 2021-05-07 LAB — SAMPLE TO BLOOD BANK

## 2021-05-07 LAB — MRSA NEXT GEN BY PCR, NASAL: MRSA by PCR Next Gen: NOT DETECTED

## 2021-05-07 SURGERY — CRANIOTOMY HEMATOMA EVACUATION SUBDURAL
Anesthesia: General | Site: Head | Laterality: Right

## 2021-05-07 MED ORDER — 0.9 % SODIUM CHLORIDE (POUR BTL) OPTIME
TOPICAL | Status: DC | PRN
  Administered 2021-05-07 (×4): 1000 mL

## 2021-05-07 MED ORDER — LEVETIRACETAM IN NACL 500 MG/100ML IV SOLN
500.0000 mg | Freq: Two times a day (BID) | INTRAVENOUS | Status: AC
  Administered 2021-05-07 – 2021-05-09 (×5): 500 mg via INTRAVENOUS
  Filled 2021-05-07 (×5): qty 100

## 2021-05-07 MED ORDER — HYDROCODONE-ACETAMINOPHEN 5-325 MG PO TABS
1.0000 | ORAL_TABLET | ORAL | Status: DC | PRN
  Administered 2021-05-10: 1
  Filled 2021-05-07: qty 1

## 2021-05-07 MED ORDER — CEFAZOLIN SODIUM-DEXTROSE 1-4 GM/50ML-% IV SOLN
1.0000 g | Freq: Three times a day (TID) | INTRAVENOUS | Status: AC
  Administered 2021-05-08 (×2): 1 g via INTRAVENOUS
  Filled 2021-05-07 (×2): qty 50

## 2021-05-07 MED ORDER — POTASSIUM CHLORIDE IN NACL 20-0.9 MEQ/L-% IV SOLN: INTRAVENOUS | Status: DC

## 2021-05-07 MED ORDER — ROCURONIUM BROMIDE 10 MG/ML (PF) SYRINGE
PREFILLED_SYRINGE | INTRAVENOUS | Status: DC | PRN
  Administered 2021-05-07: 30 mg via INTRAVENOUS
  Administered 2021-05-07: 50 mg via INTRAVENOUS

## 2021-05-07 MED ORDER — NALOXONE HCL 0.4 MG/ML IJ SOLN: 0.0800 mg | INTRAMUSCULAR | Status: DC | PRN

## 2021-05-07 MED ORDER — SUCCINYLCHOLINE CHLORIDE 20 MG/ML IJ SOLN
INTRAMUSCULAR | Status: AC | PRN
  Administered 2021-05-07: 100 mg via INTRAVENOUS

## 2021-05-07 MED ORDER — CHLORHEXIDINE GLUCONATE 0.12% ORAL RINSE (MEDLINE KIT)
15.0000 mL | Freq: Two times a day (BID) | OROMUCOSAL | Status: DC
  Administered 2021-05-07 – 2021-05-16 (×18): 15 mL via OROMUCOSAL

## 2021-05-07 MED ORDER — PROPOFOL 10 MG/ML IV BOLUS
INTRAVENOUS | Status: DC | PRN
  Administered 2021-05-07: 30 mg via INTRAVENOUS

## 2021-05-07 MED ORDER — THROMBIN 5000 UNITS EX SOLR
CUTANEOUS | Status: AC
  Filled 2021-05-07: qty 5000

## 2021-05-07 MED ORDER — PANTOPRAZOLE SODIUM 40 MG IV SOLR
40.0000 mg | Freq: Every day | INTRAVENOUS | Status: DC
  Administered 2021-05-07 – 2021-05-08 (×2): 40 mg via INTRAVENOUS
  Filled 2021-05-07 (×2): qty 40

## 2021-05-07 MED ORDER — THROMBIN 20000 UNITS EX SOLR
CUTANEOUS | Status: AC
  Filled 2021-05-07: qty 20000

## 2021-05-07 MED ORDER — TRANEXAMIC ACID 1000 MG/10ML IV SOLN
1000.0000 mg | Freq: Once | INTRAVENOUS | Status: DC
  Filled 2021-05-07: qty 10

## 2021-05-07 MED ORDER — CLEVIDIPINE BUTYRATE 0.5 MG/ML IV EMUL
INTRAVENOUS | Status: AC
  Administered 2021-05-07: 14 mg/h via INTRAVENOUS
  Filled 2021-05-07: qty 50

## 2021-05-07 MED ORDER — LEVETIRACETAM IN NACL 1000 MG/100ML IV SOLN
1000.0000 mg | Freq: Once | INTRAVENOUS | Status: AC
  Administered 2021-05-07: 1000 mg via INTRAVENOUS
  Filled 2021-05-07: qty 100

## 2021-05-07 MED ORDER — FENTANYL CITRATE (PF) 250 MCG/5ML IJ SOLN
INTRAMUSCULAR | Status: AC
  Filled 2021-05-07: qty 5

## 2021-05-07 MED ORDER — SODIUM CHLORIDE 0.9 % IV SOLN
INTRAVENOUS | Status: DC | PRN
Start: 2021-05-07 — End: 2021-05-07

## 2021-05-07 MED ORDER — LABETALOL HCL 5 MG/ML IV SOLN
10.0000 mg | Freq: Once | INTRAVENOUS | Status: AC
  Administered 2021-05-07: 10 mg via INTRAVENOUS

## 2021-05-07 MED ORDER — PROPOFOL 1000 MG/100ML IV EMUL
5.0000 ug/kg/min | INTRAVENOUS | Status: DC
  Administered 2021-05-07: 10 ug/kg/min via INTRAVENOUS
  Administered 2021-05-07: 50 ug/kg/min via INTRAVENOUS

## 2021-05-07 MED ORDER — THROMBIN 20000 UNITS EX SOLR
CUTANEOUS | Status: DC | PRN
  Administered 2021-05-07: 20 mL via TOPICAL

## 2021-05-07 MED ORDER — LIDOCAINE-EPINEPHRINE 1 %-1:100000 IJ SOLN
INTRAMUSCULAR | Status: DC | PRN
  Administered 2021-05-07: 6 mL via INTRADERMAL

## 2021-05-07 MED ORDER — EMPTY CONTAINERS FLEXIBLE MISC
900.0000 mg | Freq: Once | Status: AC
  Administered 2021-05-07: 900 mg via INTRAVENOUS
  Filled 2021-05-07: qty 90

## 2021-05-07 MED ORDER — BACITRACIN ZINC 500 UNIT/GM EX OINT
TOPICAL_OINTMENT | CUTANEOUS | Status: AC
  Filled 2021-05-07: qty 28.35

## 2021-05-07 MED ORDER — PHENYLEPHRINE HCL (PRESSORS) 10 MG/ML IV SOLN
INTRAVENOUS | Status: DC | PRN
  Administered 2021-05-07: 40 ug via INTRAVENOUS

## 2021-05-07 MED ORDER — CLEVIDIPINE BUTYRATE 0.5 MG/ML IV EMUL
0.0000 mg/h | INTRAVENOUS | Status: DC
  Administered 2021-05-07: 1 mg/h via INTRAVENOUS

## 2021-05-07 MED ORDER — TRANEXAMIC ACID-NACL 1000-0.7 MG/100ML-% IV SOLN
1000.0000 mg | Freq: Once | INTRAVENOUS | Status: AC
Start: 2021-05-07 — End: 2021-05-07
  Administered 2021-05-07: 1000 mg via INTRAVENOUS

## 2021-05-07 MED ORDER — LIDOCAINE-EPINEPHRINE 1 %-1:100000 IJ SOLN
INTRAMUSCULAR | Status: AC
  Filled 2021-05-07: qty 1

## 2021-05-07 MED ORDER — FENTANYL 2500MCG IN NS 250ML (10MCG/ML) PREMIX INFUSION
10.0000 ug/h | INTRAVENOUS | Status: AC
  Administered 2021-05-07: 50 ug/h via INTRAVENOUS
  Filled 2021-05-07: qty 250

## 2021-05-07 MED ORDER — CHLORHEXIDINE GLUCONATE CLOTH 2 % EX PADS
6.0000 | MEDICATED_PAD | Freq: Every day | CUTANEOUS | Status: DC
  Administered 2021-05-07 – 2021-05-14 (×9): 6 via TOPICAL

## 2021-05-07 MED ORDER — ETOMIDATE 2 MG/ML IV SOLN
INTRAVENOUS | Status: AC | PRN
  Administered 2021-05-07: 15 mg via INTRAVENOUS

## 2021-05-07 MED ORDER — CLEVIDIPINE BUTYRATE 0.5 MG/ML IV EMUL: 0.0000 mg/h | INTRAVENOUS | Status: DC

## 2021-05-07 MED ORDER — ORAL CARE MOUTH RINSE
15.0000 mL | OROMUCOSAL | Status: DC
  Administered 2021-05-07 – 2021-05-16 (×89): 15 mL via OROMUCOSAL

## 2021-05-07 MED ORDER — IOHEXOL 350 MG/ML SOLN
80.0000 mL | Freq: Once | INTRAVENOUS | Status: AC | PRN
  Administered 2021-05-07: 80 mL via INTRAVENOUS

## 2021-05-07 MED ORDER — BACITRACIN ZINC 500 UNIT/GM EX OINT
TOPICAL_OINTMENT | CUTANEOUS | Status: DC | PRN
  Administered 2021-05-07: 1 via TOPICAL

## 2021-05-07 MED ORDER — MORPHINE SULFATE (PF) 2 MG/ML IV SOLN
1.0000 mg | INTRAVENOUS | Status: DC | PRN
  Administered 2021-05-09: 1 mg via INTRAVENOUS
  Administered 2021-05-10: 2 mg via INTRAVENOUS
  Filled 2021-05-07 (×3): qty 1

## 2021-05-07 MED ORDER — INSULIN ASPART 100 UNIT/ML IJ SOLN: 0.0000 [IU] | Freq: Three times a day (TID) | INTRAMUSCULAR | Status: DC

## 2021-05-07 MED ORDER — FENTANYL CITRATE (PF) 100 MCG/2ML IJ SOLN
INTRAMUSCULAR | Status: DC | PRN
  Administered 2021-05-07 (×5): 50 ug via INTRAVENOUS

## 2021-05-07 MED ORDER — FENTANYL CITRATE (PF) 100 MCG/2ML IJ SOLN
INTRAMUSCULAR | Status: AC
  Administered 2021-05-07: 100 ug
  Filled 2021-05-07: qty 2

## 2021-05-07 MED ORDER — ONDANSETRON HCL 4 MG/2ML IJ SOLN: 4.0000 mg | INTRAMUSCULAR | Status: DC | PRN

## 2021-05-07 MED ORDER — CEFAZOLIN SODIUM-DEXTROSE 2-3 GM-%(50ML) IV SOLR
INTRAVENOUS | Status: DC | PRN
  Administered 2021-05-07: 2 g via INTRAVENOUS

## 2021-05-07 MED ORDER — LABETALOL HCL 5 MG/ML IV SOLN
10.0000 mg | INTRAVENOUS | Status: DC | PRN
  Administered 2021-05-08 (×2): 20 mg via INTRAVENOUS
  Administered 2021-05-13: 10 mg via INTRAVENOUS
  Administered 2021-05-14 (×2): 5 mg via INTRAVENOUS
  Administered 2021-05-14 (×2): 10 mg via INTRAVENOUS
  Administered 2021-05-14 (×4): 5 mg via INTRAVENOUS
  Administered 2021-05-15 (×2): 10 mg via INTRAVENOUS
  Administered 2021-05-15 (×2): 5 mg via INTRAVENOUS
  Administered 2021-05-15: 10 mg via INTRAVENOUS
  Filled 2021-05-07 (×11): qty 4

## 2021-05-07 MED ORDER — ONDANSETRON HCL 4 MG PO TABS: 4.0000 mg | ORAL_TABLET | ORAL | Status: DC | PRN

## 2021-05-07 SURGICAL SUPPLY — 64 items
BAG COUNTER SPONGE SURGICOUNT (BAG) ×2 IMPLANT
BENZOIN TINCTURE PRP APPL 2/3 (GAUZE/BANDAGES/DRESSINGS) IMPLANT
BLADE CLIPPER SURG (BLADE) ×2 IMPLANT
BLADE ULTRA TIP 2M (BLADE) ×1 IMPLANT
BNDG GAUZE ELAST 4 BULKY (GAUZE/BANDAGES/DRESSINGS) IMPLANT
BUR ACORN 6.0 PRECISION (BURR) ×2 IMPLANT
BUR MATCHSTICK NEURO 3.0 LAGG (BURR) IMPLANT
BUR SPIRAL ROUTER 2.3 (BUR) ×1 IMPLANT
CANISTER SUCT 3000ML PPV (MISCELLANEOUS) ×3 IMPLANT
CARTRIDGE OIL MAESTRO DRILL (MISCELLANEOUS) ×1 IMPLANT
CLIP VESOCCLUDE MED 6/CT (CLIP) IMPLANT
DIFFUSER DRILL AIR PNEUMATIC (MISCELLANEOUS) ×2 IMPLANT
DRAPE NEUROLOGICAL W/INCISE (DRAPES) ×2 IMPLANT
DRAPE SURG 17X23 STRL (DRAPES) IMPLANT
DRAPE WARM FLUID 44X44 (DRAPES) ×2 IMPLANT
DRSG TELFA 3X8 NADH (GAUZE/BANDAGES/DRESSINGS) ×2 IMPLANT
DURAPREP 6ML APPLICATOR 50/CS (WOUND CARE) ×2 IMPLANT
ELECT REM PT RETURN 9FT ADLT (ELECTROSURGICAL) ×2
ELECTRODE REM PT RTRN 9FT ADLT (ELECTROSURGICAL) ×1 IMPLANT
EVACUATOR 1/8 PVC DRAIN (DRAIN) IMPLANT
EVACUATOR SILICONE 100CC (DRAIN) IMPLANT
GAUZE 4X4 16PLY ~~LOC~~+RFID DBL (SPONGE) IMPLANT
GAUZE SPONGE 4X4 12PLY STRL (GAUZE/BANDAGES/DRESSINGS) ×2 IMPLANT
GLOVE EXAM NITRILE XL STR (GLOVE) IMPLANT
GLOVE SURG LTX SZ6.5 (GLOVE) ×3 IMPLANT
GLOVE SURG LTX SZ7 (GLOVE) ×1 IMPLANT
GOWN STRL REUS W/ TWL LRG LVL3 (GOWN DISPOSABLE) ×2 IMPLANT
GOWN STRL REUS W/ TWL XL LVL3 (GOWN DISPOSABLE) IMPLANT
GOWN STRL REUS W/TWL 2XL LVL3 (GOWN DISPOSABLE) IMPLANT
GOWN STRL REUS W/TWL LRG LVL3 (GOWN DISPOSABLE) ×4
GOWN STRL REUS W/TWL XL LVL3 (GOWN DISPOSABLE)
GRAFT DURAGEN MATRIX 2WX2L ×1 IMPLANT
GRAFT DURAGEN MATRIX 3WX3L (Graft) ×2 IMPLANT
GRAFT DURAGEN MATRIX 3X3 SNGL (Graft) IMPLANT
HEMOSTAT SURGICEL 2X14 (HEMOSTASIS) IMPLANT
KIT BASIN OR (CUSTOM PROCEDURE TRAY) ×2 IMPLANT
KIT TURNOVER KIT B (KITS) ×2 IMPLANT
NDL HYPO 25X1 1.5 SAFETY (NEEDLE) ×1 IMPLANT
NEEDLE HYPO 25X1 1.5 SAFETY (NEEDLE) ×2 IMPLANT
NS IRRIG 1000ML POUR BTL (IV SOLUTION) ×5 IMPLANT
OIL CARTRIDGE MAESTRO DRILL (MISCELLANEOUS) ×2
PACK CRANIOTOMY CUSTOM (CUSTOM PROCEDURE TRAY) ×2 IMPLANT
PAD DRESSING TELFA 3X8 NADH (GAUZE/BANDAGES/DRESSINGS) IMPLANT
PATTIES SURGICAL .5 X.5 (GAUZE/BANDAGES/DRESSINGS) IMPLANT
PATTIES SURGICAL .5 X3 (DISPOSABLE) IMPLANT
PATTIES SURGICAL 1X1 (DISPOSABLE) ×1 IMPLANT
PLATE BONE 12 2H TARGET XL (Plate) ×4 IMPLANT
PLATE CRANIAL 4H UNI NEURO III (Plate) ×1 IMPLANT
SCREW UNIII AXS SD 1.5X4 (Screw) ×11 IMPLANT
SPONGE NEURO XRAY DETECT 1X3 (DISPOSABLE) IMPLANT
SPONGE SURGIFOAM ABS GEL 100 (HEMOSTASIS) ×2 IMPLANT
STAPLER VISISTAT 35W (STAPLE) ×2 IMPLANT
SUT ETHILON 3 0 FSL (SUTURE) IMPLANT
SUT ETHILON 3 0 PS 1 (SUTURE) IMPLANT
SUT NURALON 4 0 TR CR/8 (SUTURE) ×5 IMPLANT
SUT STEEL 0 (SUTURE)
SUT STEEL 0 18XMFL TIE 17 (SUTURE) IMPLANT
SUT VIC AB 2-0 CT2 18 VCP726D (SUTURE) ×4 IMPLANT
TOWEL GREEN STERILE (TOWEL DISPOSABLE) ×2 IMPLANT
TOWEL GREEN STERILE FF (TOWEL DISPOSABLE) ×2 IMPLANT
TRAY FOLEY MTR SLVR 16FR STAT (SET/KITS/TRAYS/PACK) ×1 IMPLANT
TUBE CONNECTING 12X1/4 (SUCTIONS) ×2 IMPLANT
UNDERPAD 30X36 HEAVY ABSORB (UNDERPADS AND DIAPERS) ×2 IMPLANT
WATER STERILE IRR 1000ML POUR (IV SOLUTION) ×2 IMPLANT

## 2021-05-07 NOTE — Consult Note (Signed)
Reason for Consult:Level 1 trauma/Fall Referring Physician: [name] is an 71 y.o. female.  HPI: Pt arrived as a level 1 trauma s/p fall.  Pt arrived as a GCS 4.  Pt reportedly with uneven pupils en route.  Pt being bagged enroute.    Per EMS pt had fallen earlier this AM.  She proceeded to go to HD today.  Pt returned home and had a syncopal episode witnessed and EMS was called.  Pt with no C-collar in place on arrival.  Pt with decorticate posturing in the ED.  History reviewed. No pertinent past medical history.   History reviewed. No pertinent family history.  Social History:  has no history on file for tobacco use, alcohol use, and drug use.  Allergies: No Known Allergies  Medications: I have reviewed the patient's current medications.  Results for orders placed or performed during the hospital encounter of 05/12/2021 (from the past 48 hour(s))  CBC     Status: None   Collection Time: 05/04/2021  4:14 PM  Result Value Ref Range   WBC 6.1 4.0 - 10.5 K/uL   RBC 4.79 3.87 - 5.11 MIL/uL   Hemoglobin 13.1 12.0 - 15.0 g/dL   HCT 42.6 36.0 - 46.0 %   MCV 88.9 80.0 - 100.0 fL   MCH 27.3 26.0 - 34.0 pg   MCHC 30.8 30.0 - 36.0 g/dL   RDW 15.5 11.5 - 15.5 %   Platelets 187 150 - 400 K/uL   nRBC 0.0 0.0 - 0.2 %    Comment: Performed at [facility], [place] [address], [place], [place] [address]  Protime-INR     Status: Abnormal   Collection Time: 04/27/2021  4:14 PM  Result Value Ref Range   Prothrombin Time 18.7 (H) 11.4 - 15.2 seconds   INR 1.6 (H) 0.8 - 1.2    Comment: (NOTE) INR goal varies based on device and disease states. Performed at [facility], [place] [address], [place], [place] [address]   Ethanol     Status: None   Collection Time: 04/22/2021  4:15 PM  Result Value Ref Range   Alcohol, Ethyl (B) <10 <10 mg/dL    Comment: (NOTE) Lowest detectable limit for serum alcohol is 10 mg/dL.  For medical purposes only. Performed at [facility], [place] [address], [place], [place] [address]   Lactic acid, plasma     Status: Abnormal   Collection Time: 05/06/2021  4:15 PM  Result Value Ref Range   Lactic Acid, Venous 2.0 (HH) 0.5 - 1.9 mmol/L    Comment: CRITICAL RESULT CALLED TO, READ BACK BY AND VERIFIED WITH: HARDY R,RN 04/23/2021 1730 WAYK Performed at [place] [address] [place], [place] [address]   I-Stat Chem 8, ED     Status: Abnormal   Collection Time: 04/22/2021  4:16 PM  Result Value Ref Range   Sodium 135 135 - 145 mmol/L   Potassium 4.1 3.5 - 5.1 mmol/L   Chloride 96 (L) 98 - 111 mmol/L   BUN 14 8 - 23 mg/dL   Creatinine, Ser 1.60 (H) 0.44 - 1.00 mg/dL   Glucose, Bld 213 (H) 70 - 99 mg/dL    Comment: Glucose reference range applies only to samples taken after fasting for at least 8 hours.   Calcium, Ion 1.05 (L) 1.15 - 1.40 mmol/L   TCO2 31 22 - 32 mmol/L   Hemoglobin 15.6 (H) 12.0 -  15.0 g/dL   HCT 46.0 36.0 - 46.0 %  Sample to Blood Bank     Status: None   Collection Time: 04/22/2021  4:23 PM  Result Value Ref Range   Blood Bank Specimen SAMPLE AVAILABLE FOR TESTING    Sample Expiration      05/08/2021,2359 Performed at [facility], [place] [address], [place], [place] [address]     CT HEAD WO CONTRAST  Result Date: 04/30/2021 CLINICAL DATA:  71 year old female with syncope with head, facial and neck injury and altered mental status. Patient on Eliquis. EXAM: CT HEAD WITHOUT CONTRAST CT MAXILLOFACIAL WITHOUT CONTRAST CT CERVICAL SPINE WITHOUT CONTRAST TECHNIQUE: Multidetector CT imaging of the head, cervical spine, and maxillofacial structures were performed using the standard protocol without intravenous contrast. Multiplanar CT image reconstructions of the cervical spine and maxillofacial structures were also generated. RADIATION DOSE REDUCTION: This exam was performed according to the departmental dose-optimization program which includes automated exposure control, adjustment of the mA  and/or kV according to patient size and/or use of iterative reconstruction technique. COMPARISON:  None. FINDINGS: CT HEAD FINDINGS Brain: A hyperacute/acute subdural hematoma is noted overlying the majority of the RIGHT cerebral hemisphere and measuring up to 1.7 cm in greatest diameter. There is 1.7 cm RIGHT to LEFT midline shift with enlargement of the LEFT ventricle. Effacement of the basilar cisterns is also noted. No acute infarction is identified. Vascular: Carotid atherosclerotic calcifications are noted. Skull: No acute fracture identified. Other: RIGHT facial soft tissue swelling/hemorrhage is noted. CT MAXILLOFACIAL FINDINGS Osseous: No fracture or mandibular dislocation. No destructive process. Orbits: Negative. No traumatic or inflammatory finding. Sinuses: No acute abnormality Soft tissues: Moderate RIGHT facial soft tissue swelling/hemorrhage is noted. CT CERVICAL SPINE FINDINGS Alignment: Normal. Skull base and vertebrae: No acute fracture. No primary bone lesion or focal pathologic process. Soft tissues and spinal canal: No prevertebral fluid or swelling. No visible canal hematoma. Disc levels:  Unremarkable Upper chest: No acute fracture Other: Endotracheal tube and orogastric tube identified. IMPRESSION: 1. 1.7 cm hyperacute/acute RIGHT subdural hematoma overlying the majority of the RIGHT cerebral hemisphere with mass effect causing 1.7 cm RIGHT to LEFT midline shift, enlargement of the LEFT ventricle and effacement of the basilar cisterns. 2. RIGHT facial soft tissue swelling/hemorrhage without facial fracture. 3. No static evidence of acute injury to the cervical spine. Critical Value/emergent results were called by telephone at the time of interpretation on 05/15/2021 at 5:05 pm to provider Dr. Rosendo [name], who verbally acknowledged these results. Electronically Signed   By: Molly [name] M.D.   On: 05/03/2021 17:16   CT CERVICAL SPINE WO CONTRAST  Result Date: 05/15/2021 CLINICAL DATA:   71 year old female with syncope with head, facial and neck injury and altered mental status. Patient on Eliquis. EXAM: CT HEAD WITHOUT CONTRAST CT MAXILLOFACIAL WITHOUT CONTRAST CT CERVICAL SPINE WITHOUT CONTRAST TECHNIQUE: Multidetector CT imaging of the head, cervical spine, and maxillofacial structures were performed using the standard protocol without intravenous contrast. Multiplanar CT image reconstructions of the cervical spine and maxillofacial structures were also generated. RADIATION DOSE REDUCTION: This exam was performed according to the departmental dose-optimization program which includes automated exposure control, adjustment of the mA and/or kV according to patient size and/or use of iterative reconstruction technique. COMPARISON:  None. FINDINGS: CT HEAD FINDINGS Brain: A hyperacute/acute subdural hematoma is noted overlying the majority of the RIGHT cerebral hemisphere and measuring up to 1.7 cm in greatest diameter. There is 1.7 cm RIGHT to LEFT midline shift with enlargement  of the LEFT ventricle. Effacement of the basilar cisterns is also noted. No acute infarction is identified. Vascular: Carotid atherosclerotic calcifications are noted. Skull: No acute fracture identified. Other: RIGHT facial soft tissue swelling/hemorrhage is noted. CT MAXILLOFACIAL FINDINGS Osseous: No fracture or mandibular dislocation. No destructive process. Orbits: Negative. No traumatic or inflammatory finding. Sinuses: No acute abnormality Soft tissues: Moderate RIGHT facial soft tissue swelling/hemorrhage is noted. CT CERVICAL SPINE FINDINGS Alignment: Normal. Skull base and vertebrae: No acute fracture. No primary bone lesion or focal pathologic process. Soft tissues and spinal canal: No prevertebral fluid or swelling. No visible canal hematoma. Disc levels:  Unremarkable Upper chest: No acute fracture Other: Endotracheal tube and orogastric tube identified. IMPRESSION: 1. 1.7 cm hyperacute/acute RIGHT subdural  hematoma overlying the majority of the RIGHT cerebral hemisphere with mass effect causing 1.7 cm RIGHT to LEFT midline shift, enlargement of the LEFT ventricle and effacement of the basilar cisterns. 2. RIGHT facial soft tissue swelling/hemorrhage without facial fracture. 3. No static evidence of acute injury to the cervical spine. Critical Value/emergent results were called by telephone at the time of interpretation on 04/30/2021 at 5:05 pm to provider Dr. Rosendo [name], who verbally acknowledged these results. Electronically Signed   By: Molly [name] M.D.   On: 05/16/2021 17:16   DG Pelvis Portable  Result Date: 05/11/2021 CLINICAL DATA:  Trauma, fall EXAM: PORTABLE PELVIS 1-2 VIEWS COMPARISON:  None. FINDINGS: No displaced fracture is seen. There is previous internal fixation in the proximal left femur. Lateral aspect of left iliac bone ischial bones and lateral aspect of proximal left tibia are not fully included in the study limiting evaluation. Left ureteral stent is noted in place. There is mild dilation of small-bowel loops measuring up to 3.1 cm in diameter. Gas is present in the rectum. IMPRESSION: No definite displaced fracture is seen in the limited AP view of pelvis. Electronically Signed   By: Elmer Picker M.D.   On: 05/12/2021 16:59   CT CHEST ABDOMEN PELVIS W CONTRAST  Result Date: 05/14/2021 CLINICAL DATA:  71 year old female with fall and chest, abdomen and pelvic injury. Patient with chronic renal failure on dialysis. EXAM: CT CHEST, ABDOMEN, AND PELVIS WITH CONTRAST TECHNIQUE: Multidetector CT imaging of the chest, abdomen and pelvis was performed following the standard protocol during bolus administration of intravenous contrast. RADIATION DOSE REDUCTION: This exam was performed according to the departmental dose-optimization program which includes automated exposure control, adjustment of the mA and/or kV according to patient size and/or use of iterative reconstruction technique.  CONTRAST:  20mL OMNIPAQUE IOHEXOL 350 MG/ML SOLN COMPARISON:  01/10/2016 chest CT and 06/21/2015 abdomen/pelvic CT FINDINGS: CT CHEST FINDINGS Cardiovascular: Cardiomegaly again noted. Heavy coronary artery and thoracic aortic atherosclerotic calcification again identified. There is no evidence of thoracic aortic aneurysm, definite dissection or pericardial effusion. Mediastinum/Nodes: An endotracheal tube and OG tube are present. There is no evidence of mediastinal hematoma or mass. No abnormal lymph nodes are identified. Lungs/Pleura: Bibasilar atelectasis and trace RIGHT pleural effusion noted. Patchy ground-glass and mild airspace opacities within the RIGHT UPPER lobe and RIGHT LOWER lobe noted which may represent aspiration or less likely contusions. There is no evidence of pneumothorax. No discrete mass or pulmonary nodules noted. Musculoskeletal: No acute or suspicious bony abnormalities are noted. CT ABDOMEN PELVIS FINDINGS Hepatobiliary: The liver is unremarkable. The patient is status post cholecystectomy. There is no evidence of intrahepatic or extrahepatic biliary dilatation. Pancreas: A 1.2 cm cyst within the pancreatic body is noted. No other abnormalities are  identified. Spleen: No definite abnormality. Heterogeneity on arterial phase images is likely normal differential flow. Adrenals/Urinary Tract: Kidneys are small and slightly heterogeneous with cortical atrophy. A LEFT urinary stent noted with tips in the LEFT renal pelvis and bladder. No definite solid mass or hydronephrosis noted. The adrenal glands and bladder are otherwise unremarkable. Stomach/Bowel: Stomach is within normal limits except for NG tube. No evidence of bowel wall thickening, distention, or inflammatory changes. Vascular/Lymphatic: Heavy abdominal aortic atherosclerotic calcification and plaque noted without aneurysm. LEFT femoral bypass graft is noted. No enlarged lymph nodes are identified. Reproductive: Uterus and bilateral  adnexa are unremarkable. Other: No ascites, focal collection or pneumoperitoneum. Musculoskeletal: No acute or suspicious bony abnormalities are noted. Surgical hardware within proximal LEFT femur is noted. IMPRESSION: 1. No evidence of acute injury within the abdomen or pelvis. 2. Patchy ground-glass and mild airspace opacities within the RIGHT UPPER and RIGHT LOWER lobes which may represent aspiration or less likely contusions. No evidence of acute fracture. 3. Bibasilar atelectasis and trace RIGHT pleural effusion. 4. Cardiomegaly and coronary artery disease. 5. LEFT urinary stent. 6. 1.2 cm pancreatic body cyst. Consider 1 year follow-up CT or MRI as clinically indicated. 7. Aortic Atherosclerosis (ICD10-I70.0). Electronically Signed   By: Molly [name] M.D.   On: 04/21/2021 17:30   DG Chest Port 1 View  Result Date: 04/19/2021 CLINICAL DATA:  Fall EXAM: PORTABLE CHEST 1 VIEW COMPARISON:  None. FINDINGS: Cardiomegaly. Mild, diffuse bilateral interstitial pulmonary opacity. Right neck large bore multi lumen vascular catheter. Endotracheal tube with tip above the carina. Esophagogastric tube with tip and side port below the diaphragm. No displaced rib fracture or pneumothorax. IMPRESSION: 1. Cardiomegaly with mild, diffuse bilateral interstitial pulmonary opacity, most likely edema. 2. Endotracheal tube with tip above the carina. 3. Esophagogastric tube with tip and side port below the diaphragm. Electronically Signed   By: Delanna Ahmadi M.D.   On: 05/05/2021 16:57   CT MAXILLOFACIAL WO CONTRAST  Result Date: 05/09/2021 CLINICAL DATA:  71 year old female with syncope with head, facial and neck injury and altered mental status. Patient on Eliquis. EXAM: CT HEAD WITHOUT CONTRAST CT MAXILLOFACIAL WITHOUT CONTRAST CT CERVICAL SPINE WITHOUT CONTRAST TECHNIQUE: Multidetector CT imaging of the head, cervical spine, and maxillofacial structures were performed using the standard protocol without intravenous contrast.  Multiplanar CT image reconstructions of the cervical spine and maxillofacial structures were also generated. RADIATION DOSE REDUCTION: This exam was performed according to the departmental dose-optimization program which includes automated exposure control, adjustment of the mA and/or kV according to patient size and/or use of iterative reconstruction technique. COMPARISON:  None. FINDINGS: CT HEAD FINDINGS Brain: A hyperacute/acute subdural hematoma is noted overlying the majority of the RIGHT cerebral hemisphere and measuring up to 1.7 cm in greatest diameter. There is 1.7 cm RIGHT to LEFT midline shift with enlargement of the LEFT ventricle. Effacement of the basilar cisterns is also noted. No acute infarction is identified. Vascular: Carotid atherosclerotic calcifications are noted. Skull: No acute fracture identified. Other: RIGHT facial soft tissue swelling/hemorrhage is noted. CT MAXILLOFACIAL FINDINGS Osseous: No fracture or mandibular dislocation. No destructive process. Orbits: Negative. No traumatic or inflammatory finding. Sinuses: No acute abnormality Soft tissues: Moderate RIGHT facial soft tissue swelling/hemorrhage is noted. CT CERVICAL SPINE FINDINGS Alignment: Normal. Skull base and vertebrae: No acute fracture. No primary bone lesion or focal pathologic process. Soft tissues and spinal canal: No prevertebral fluid or swelling. No visible canal hematoma. Disc levels:  Unremarkable Upper chest: No acute fracture Other:  Endotracheal tube and orogastric tube identified. IMPRESSION: 1. 1.7 cm hyperacute/acute RIGHT subdural hematoma overlying the majority of the RIGHT cerebral hemisphere with mass effect causing 1.7 cm RIGHT to LEFT midline shift, enlargement of the LEFT ventricle and effacement of the basilar cisterns. 2. RIGHT facial soft tissue swelling/hemorrhage without facial fracture. 3. No static evidence of acute injury to the cervical spine. Critical Value/emergent results were called by  telephone at the time of interpretation on 05/15/2021 at 5:05 pm to provider Dr. Rosendo [name], who verbally acknowledged these results. Electronically Signed   By: Molly [name] M.D.   On: 04/29/2021 17:16    Review of Systems  Unable to perform ROS: Acuity of condition  Blood pressure (!) 168/52, pulse 63, temperature (!) 96.7 F (35.9 C), resp. rate 15, height 5\' 2"  (1.575 m), weight 70 kg, SpO2 100 %. Physical Exam HENT:     Head:      Right Ear: Tympanic membrane normal.     Left Ear: Tympanic membrane normal.     Nose: Nose normal.     Mouth/Throat:     Mouth: Mucous membranes are moist.     Pharynx: Oropharynx is clear.  Eyes:     Comments: L = 73mm R = 3-15mm  Cardiovascular:     Rate and Rhythm: Normal rate.     Pulses: Normal pulses.     Heart sounds: Normal heart sounds.  Pulmonary:     Effort: Pulmonary effort is normal.     Breath sounds: No wheezing, rhonchi or rales.  Chest:     Chest wall: No tenderness.  Abdominal:     General: There is no distension.     Palpations: There is no mass.     Tenderness: There is no abdominal tenderness. There is no guarding or rebound.  Skin:    General: Skin is warm.  Neurological:     Mental Status: She is unresponsive.     GCS: GCS eye subscore is 1. GCS verbal subscore is 1. GCS motor subscore is 2.    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 Arneta Cliche findings of SDH and shift Pt with no other traumatic findings.    Critical Care time: 66min Time was spent providing critical care services, devoting full attention to the patient and, therefore, cannot provide services to any other patient during the same period of time. Ralene Ok 04/29/2021, 5:34 PM

## 2021-05-07 NOTE — ED Triage Notes (Signed)
Patient BIB [facility] from home. Patient had mechanical fall this morning, prior to dialysis, pt went to dialysis, pt went home, syncopal episode approx 1530, unresponsive with family. Left pupil pinpoint.

## 2021-05-07 NOTE — H&P (Signed)
Molly [name] is an 71 y.o. female.   Chief Complaint: subdural hematoma HPI: Molly [name] is a 71 y.o. female With hx of fall this morning striking her head on her nightstand. Did not lose consciousness. Was at her baseline according to husband. Went to dialysis later and only abnormality was that she had a bowel movement and soiled herself. Mr. Moskowitz was called and asked to bring her a change of clothes. He stated at dialysis she was otherwise normal. He brought her home, and slowly her neurological exam declined. When she was lethargic he called 911, and she was taken to the hospital. States the EMS said her right pupil was enlarged prior to leaving his home for [facility]. Head CT revealed an acute panhemispheric subdural hematoma.   History reviewed. No pertinent past medical history.  History reviewed. No pertinent surgical history.  History reviewed. No pertinent family history. Social History:  has no history on file for tobacco use, alcohol use, and drug use.  Allergies: No Known Allergies  (Not in a hospital admission)   Results for orders placed or performed during the hospital encounter of 05/12/2021 (from the past 48 hour(s))  CBC     Status: None   Collection Time: 04/26/2021  4:14 PM  Result Value Ref Range   WBC 6.1 4.0 - 10.5 K/uL   RBC 4.79 3.87 - 5.11 MIL/uL   Hemoglobin 13.1 12.0 - 15.0 g/dL   HCT 42.6 36.0 - 46.0 %   MCV 88.9 80.0 - 100.0 fL   MCH 27.3 26.0 - 34.0 pg   MCHC 30.8 30.0 - 36.0 g/dL   RDW 15.5 11.5 - 15.5 %   Platelets 187 150 - 400 K/uL   nRBC 0.0 0.0 - 0.2 %    Comment: Performed at [facility], [place] [address] [place], [place] [address]  Protime-INR     Status: Abnormal   Collection Time: 05/10/2021  4:14 PM  Result Value Ref Range   Prothrombin Time 18.7 (H) 11.4 - 15.2 seconds   INR 1.6 (H) 0.8 - 1.2    Comment: (NOTE) INR goal varies based on device and disease states. Performed at [facility], [place] [address] [place],  [place] [address]   Ethanol     Status: None   Collection Time: 05/01/2021  4:15 PM  Result Value Ref Range   Alcohol, Ethyl (B) <10 <10 mg/dL    Comment: (NOTE) Lowest detectable limit for serum alcohol is 10 mg/dL.  For medical purposes only. Performed at [facility], [place] [address], [place], [place] [address]   Lactic acid, plasma     Status: Abnormal   Collection Time: 04/22/2021  4:15 PM  Result Value Ref Range   Lactic Acid, Venous 2.0 (HH) 0.5 - 1.9 mmol/L    Comment: CRITICAL RESULT CALLED TO, READ BACK BY AND VERIFIED WITH: HARDY R,RN 05/15/2021 1730 WAYK Performed at [facility] [address], [place], [place] [address]   I-Stat Chem 8, ED     Status: Abnormal   Collection Time: 05/08/2021  4:16 PM  Result Value Ref Range   Sodium 135 135 - 145 mmol/L   Potassium 4.1 3.5 - 5.1 mmol/L   Chloride 96 (L) 98 - 111 mmol/L   BUN 14 8 - 23 mg/dL   Creatinine, Ser 1.60 (H) 0.44 - 1.00 mg/dL   Glucose, Bld 213 (H) 70 - 99 mg/dL    Comment: Glucose reference range  applies only to samples taken after fasting for at least 8 hours.   Calcium, Ion 1.05 (L) 1.15 - 1.40 mmol/L   TCO2 31 22 - 32 mmol/L   Hemoglobin 15.6 (H) 12.0 - 15.0 g/dL   HCT 46.0 36.0 - 46.0 %  Sample to Blood Bank     Status: None   Collection Time: 05/08/2021  4:23 PM  Result Value Ref Range   Blood Bank Specimen SAMPLE AVAILABLE FOR TESTING    Sample Expiration      05/08/2021,2359 Performed at [facility], [place] [address] [place], [place] [address]    CT HEAD WO CONTRAST  Result Date: 05/16/2021 CLINICAL DATA:  71 year old female with syncope with head, facial and neck injury and altered mental status. Patient on Eliquis. EXAM: CT HEAD WITHOUT CONTRAST CT MAXILLOFACIAL WITHOUT CONTRAST CT CERVICAL SPINE WITHOUT CONTRAST TECHNIQUE: Multidetector CT imaging of the head, cervical spine, and maxillofacial structures were performed using the standard protocol without intravenous contrast. Multiplanar  CT image reconstructions of the cervical spine and maxillofacial structures were also generated. RADIATION DOSE REDUCTION: This exam was performed according to the departmental dose-optimization program which includes automated exposure control, adjustment of the mA and/or kV according to patient size and/or use of iterative reconstruction technique. COMPARISON:  None. FINDINGS: CT HEAD FINDINGS Brain: A hyperacute/acute subdural hematoma is noted overlying the majority of the RIGHT cerebral hemisphere and measuring up to 1.7 cm in greatest diameter. There is 1.7 cm RIGHT to LEFT midline shift with enlargement of the LEFT ventricle. Effacement of the basilar cisterns is also noted. No acute infarction is identified. Vascular: Carotid atherosclerotic calcifications are noted. Skull: No acute fracture identified. Other: RIGHT facial soft tissue swelling/hemorrhage is noted. CT MAXILLOFACIAL FINDINGS Osseous: No fracture or mandibular dislocation. No destructive process. Orbits: Negative. No traumatic or inflammatory finding. Sinuses: No acute abnormality Soft tissues: Moderate RIGHT facial soft tissue swelling/hemorrhage is noted. CT CERVICAL SPINE FINDINGS Alignment: Normal. Skull base and vertebrae: No acute fracture. No primary bone lesion or focal pathologic process. Soft tissues and spinal canal: No prevertebral fluid or swelling. No visible canal hematoma. Disc levels:  Unremarkable Upper chest: No acute fracture Other: Endotracheal tube and orogastric tube identified. IMPRESSION: 1. 1.7 cm hyperacute/acute RIGHT subdural hematoma overlying the majority of the RIGHT cerebral hemisphere with mass effect causing 1.7 cm RIGHT to LEFT midline shift, enlargement of the LEFT ventricle and effacement of the basilar cisterns. 2. RIGHT facial soft tissue swelling/hemorrhage without facial fracture. 3. No static evidence of acute injury to the cervical spine. Critical Value/emergent results were called by telephone at the  time of interpretation on 04/28/2021 at 5:05 pm to provider Dr. Rosendo Gros, who verbally acknowledged these results. Electronically Signed   By: Margarette Canada M.D.   On: 04/24/2021 17:16   CT CERVICAL SPINE WO CONTRAST  Result Date: 04/27/2021 CLINICAL DATA:  71 year old female with syncope with head, facial and neck injury and altered mental status. Patient on Eliquis. EXAM: CT HEAD WITHOUT CONTRAST CT MAXILLOFACIAL WITHOUT CONTRAST CT CERVICAL SPINE WITHOUT CONTRAST TECHNIQUE: Multidetector CT imaging of the head, cervical spine, and maxillofacial structures were performed using the standard protocol without intravenous contrast. Multiplanar CT image reconstructions of the cervical spine and maxillofacial structures were also generated. RADIATION DOSE REDUCTION: This exam was performed according to the departmental dose-optimization program which includes automated exposure control, adjustment of the mA and/or kV according to patient size and/or use of iterative reconstruction technique. COMPARISON:  None. FINDINGS: CT HEAD  FINDINGS Brain: A hyperacute/acute subdural hematoma is noted overlying the majority of the RIGHT cerebral hemisphere and measuring up to 1.7 cm in greatest diameter. There is 1.7 cm RIGHT to LEFT midline shift with enlargement of the LEFT ventricle. Effacement of the basilar cisterns is also noted. No acute infarction is identified. Vascular: Carotid atherosclerotic calcifications are noted. Skull: No acute fracture identified. Other: RIGHT facial soft tissue swelling/hemorrhage is noted. CT MAXILLOFACIAL FINDINGS Osseous: No fracture or mandibular dislocation. No destructive process. Orbits: Negative. No traumatic or inflammatory finding. Sinuses: No acute abnormality Soft tissues: Moderate RIGHT facial soft tissue swelling/hemorrhage is noted. CT CERVICAL SPINE FINDINGS Alignment: Normal. Skull base and vertebrae: No acute fracture. No primary bone lesion or focal pathologic process. Soft  tissues and spinal canal: No prevertebral fluid or swelling. No visible canal hematoma. Disc levels:  Unremarkable Upper chest: No acute fracture Other: Endotracheal tube and orogastric tube identified. IMPRESSION: 1. 1.7 cm hyperacute/acute RIGHT subdural hematoma overlying the majority of the RIGHT cerebral hemisphere with mass effect causing 1.7 cm RIGHT to LEFT midline shift, enlargement of the LEFT ventricle and effacement of the basilar cisterns. 2. RIGHT facial soft tissue swelling/hemorrhage without facial fracture. 3. No static evidence of acute injury to the cervical spine. Critical Value/emergent results were called by telephone at the time of interpretation on 05/06/2021 at 5:05 pm to provider Dr. Rosendo Gros, who verbally acknowledged these results. Electronically Signed   By: Margarette Canada M.D.   On: 05/09/2021 17:16   DG Pelvis Portable  Result Date: 05/02/2021 CLINICAL DATA:  Trauma, fall EXAM: PORTABLE PELVIS 1-2 VIEWS COMPARISON:  None. FINDINGS: No displaced fracture is seen. There is previous internal fixation in the proximal left femur. Lateral aspect of left iliac bone ischial bones and lateral aspect of proximal left tibia are not fully included in the study limiting evaluation. Left ureteral stent is noted in place. There is mild dilation of small-bowel loops measuring up to 3.1 cm in diameter. Gas is present in the rectum. IMPRESSION: No definite displaced fracture is seen in the limited AP view of pelvis. Electronically Signed   By: Elmer Picker M.D.   On: 04/29/2021 16:59   CT CHEST ABDOMEN PELVIS W CONTRAST  Result Date: 05/04/2021 CLINICAL DATA:  71 year old female with fall and chest, abdomen and pelvic injury. Patient with chronic renal failure on dialysis. EXAM: CT CHEST, ABDOMEN, AND PELVIS WITH CONTRAST TECHNIQUE: Multidetector CT imaging of the chest, abdomen and pelvis was performed following the standard protocol during bolus administration of intravenous contrast.  RADIATION DOSE REDUCTION: This exam was performed according to the departmental dose-optimization program which includes automated exposure control, adjustment of the mA and/or kV according to patient size and/or use of iterative reconstruction technique. CONTRAST:  23mL OMNIPAQUE IOHEXOL 350 MG/ML SOLN COMPARISON:  01/10/2016 chest CT and 06/21/2015 abdomen/pelvic CT FINDINGS: CT CHEST FINDINGS Cardiovascular: Cardiomegaly again noted. Heavy coronary artery and thoracic aortic atherosclerotic calcification again identified. There is no evidence of thoracic aortic aneurysm, definite dissection or pericardial effusion. Mediastinum/Nodes: An endotracheal tube and OG tube are present. There is no evidence of mediastinal hematoma or mass. No abnormal lymph nodes are identified. Lungs/Pleura: Bibasilar atelectasis and trace RIGHT pleural effusion noted. Patchy ground-glass and mild airspace opacities within the RIGHT UPPER lobe and RIGHT LOWER lobe noted which may represent aspiration or less likely contusions. There is no evidence of pneumothorax. No discrete mass or pulmonary nodules noted. Musculoskeletal: No acute or suspicious bony abnormalities are noted. CT ABDOMEN PELVIS FINDINGS  Hepatobiliary: The liver is unremarkable. The patient is status post cholecystectomy. There is no evidence of intrahepatic or extrahepatic biliary dilatation. Pancreas: A 1.2 cm cyst within the pancreatic body is noted. No other abnormalities are identified. Spleen: No definite abnormality. Heterogeneity on arterial phase images is likely normal differential flow. Adrenals/Urinary Tract: Kidneys are small and slightly heterogeneous with cortical atrophy. A LEFT urinary stent noted with tips in the LEFT renal pelvis and bladder. No definite solid mass or hydronephrosis noted. The adrenal glands and bladder are otherwise unremarkable. Stomach/Bowel: Stomach is within normal limits except for NG tube. No evidence of bowel wall thickening,  distention, or inflammatory changes. Vascular/Lymphatic: Heavy abdominal aortic atherosclerotic calcification and plaque noted without aneurysm. LEFT femoral bypass graft is noted. No enlarged lymph nodes are identified. Reproductive: Uterus and bilateral adnexa are unremarkable. Other: No ascites, focal collection or pneumoperitoneum. Musculoskeletal: No acute or suspicious bony abnormalities are noted. Surgical hardware within proximal LEFT femur is noted. IMPRESSION: 1. No evidence of acute injury within the abdomen or pelvis. 2. Patchy ground-glass and mild airspace opacities within the RIGHT UPPER and RIGHT LOWER lobes which may represent aspiration or less likely contusions. No evidence of acute fracture. 3. Bibasilar atelectasis and trace RIGHT pleural effusion. 4. Cardiomegaly and coronary artery disease. 5. LEFT urinary stent. 6. 1.2 cm pancreatic body cyst. Consider 1 year follow-up CT or MRI as clinically indicated. 7. Aortic Atherosclerosis (ICD10-I70.0). Electronically Signed   By: Margarette Canada M.D.   On: 04/25/2021 17:30   DG Chest Port 1 View  Result Date: 04/29/2021 CLINICAL DATA:  Fall EXAM: PORTABLE CHEST 1 VIEW COMPARISON:  None. FINDINGS: Cardiomegaly. Mild, diffuse bilateral interstitial pulmonary opacity. Right neck large bore multi lumen vascular catheter. Endotracheal tube with tip above the carina. Esophagogastric tube with tip and side port below the diaphragm. No displaced rib fracture or pneumothorax. IMPRESSION: 1. Cardiomegaly with mild, diffuse bilateral interstitial pulmonary opacity, most likely edema. 2. Endotracheal tube with tip above the carina. 3. Esophagogastric tube with tip and side port below the diaphragm. Electronically Signed   By: Delanna Ahmadi M.D.   On: 05/16/2021 16:57   CT MAXILLOFACIAL WO CONTRAST  Result Date: 05/12/2021 CLINICAL DATA:  71 year old female with syncope with head, facial and neck injury and altered mental status. Patient on Eliquis. EXAM: CT  HEAD WITHOUT CONTRAST CT MAXILLOFACIAL WITHOUT CONTRAST CT CERVICAL SPINE WITHOUT CONTRAST TECHNIQUE: Multidetector CT imaging of the head, cervical spine, and maxillofacial structures were performed using the standard protocol without intravenous contrast. Multiplanar CT image reconstructions of the cervical spine and maxillofacial structures were also generated. RADIATION DOSE REDUCTION: This exam was performed according to the departmental dose-optimization program which includes automated exposure control, adjustment of the mA and/or kV according to patient size and/or use of iterative reconstruction technique. COMPARISON:  None. FINDINGS: CT HEAD FINDINGS Brain: A hyperacute/acute subdural hematoma is noted overlying the majority of the RIGHT cerebral hemisphere and measuring up to 1.7 cm in greatest diameter. There is 1.7 cm RIGHT to LEFT midline shift with enlargement of the LEFT ventricle. Effacement of the basilar cisterns is also noted. No acute infarction is identified. Vascular: Carotid atherosclerotic calcifications are noted. Skull: No acute fracture identified. Other: RIGHT facial soft tissue swelling/hemorrhage is noted. CT MAXILLOFACIAL FINDINGS Osseous: No fracture or mandibular dislocation. No destructive process. Orbits: Negative. No traumatic or inflammatory finding. Sinuses: No acute abnormality Soft tissues: Moderate RIGHT facial soft tissue swelling/hemorrhage is noted. CT CERVICAL SPINE FINDINGS Alignment: Normal. Skull base and  vertebrae: No acute fracture. No primary bone lesion or focal pathologic process. Soft tissues and spinal canal: No prevertebral fluid or swelling. No visible canal hematoma. Disc levels:  Unremarkable Upper chest: No acute fracture Other: Endotracheal tube and orogastric tube identified. IMPRESSION: 1. 1.7 cm hyperacute/acute RIGHT subdural hematoma overlying the majority of the RIGHT cerebral hemisphere with mass effect causing 1.7 cm RIGHT to LEFT midline shift,  enlargement of the LEFT ventricle and effacement of the basilar cisterns. 2. RIGHT facial soft tissue swelling/hemorrhage without facial fracture. 3. No static evidence of acute injury to the cervical spine. Critical Value/emergent results were called by telephone at the time of interpretation on 05/10/2021 at 5:05 pm to provider Dr. Rosendo Gros, who verbally acknowledged these results. Electronically Signed   By: Margarette Canada M.D.   On: 04/21/2021 17:16    Review of Systems  Reason unable to perform ROS: intubated , non responsive.   Blood pressure (!) 154/54, pulse (!) 54, temperature (!) 95.6 F (35.3 C), temperature source Temporal, resp. rate (!) 8, height 5\' 2"  (1.575 m), weight 70 kg, SpO2 100 %. Physical Exam Constitutional:      Comments: Non responsive, intubated and sedated  HENT:     Head:     Comments: Bruising and edema right face Eyes:     Comments: Pupils not reactive Right pupil approximately 10mm, left 77mm midline  Neurological:     Mental Status: She is unresponsive.     Comments: Comatose Decorticate posturing No corneal reflexes Not localizing Cannot assess sensation, gait, strength     Assessment/Plan OR for emergent craniotomy to evacuate right acute subdural hematoma Have explained grim prognosis to her husband. Have explained current situation And added complications of the eliquis.  He "want you to do everything to save her" Reversing eliquis currently Ashok Pall, MD 04/26/2021, 5:55 PM

## 2021-05-07 NOTE — Consult Note (Signed)
NAME:  Molly [name], MRN:  [id number], DOB:  11-10-1950, LOS: 0 ADMISSION DATE:  04/18/2021, CONSULTATION DATE:  05/06/2021 REFERRING MD:  Billy Fischer, CHIEF COMPLAINT:  loss of consciousness   History of Present Illness:  71yF with history of ESRD, PAD, chronic diastolic heart failure, OSA, on eliquis for ?AF per husband who was in her USOH until this morning fell and hit her head on a dresser. She felt ok enough to still go to dialysis but over the course of the day got more and more drowsy. Able to complete about an hour and a half of dialysis. Eventually became essentially unresponsive and husband called EMS.  In ED found to have traumatic SDH with midline shift, she was intubated and she was given 1g TXA, andexxanet.   Pertinent  Medical History  ESRD PAD Chronic diastolic heart failure OSA Eliquis for ?AF  Significant Hospital Events: Including procedures, antibiotic start and stop dates in addition to other pertinent events   04/28/2021 intubated, TXA, andexxanet, to OR for decompression/evacuation  Interim History / Subjective:    Objective   Blood pressure (!) 190/72, pulse 73, temperature (!) 96.7 F (35.9 C), resp. rate 18, height 5\' 2"  (1.575 m), weight 70 kg, SpO2 100 %.    Vent Mode: PRVC FiO2 (%):  [60 %] 60 % Set Rate:  [18 bmp] 18 bmp Vt Set:  [400 mL] 400 mL PEEP:  [5 [place] Pressure:  [17 cmH20] 17 cmH20  No intake or output data in the 24 hours ending 04/17/2021 1708 Filed Weights   05/15/2021 1621  Weight: 70 kg    Examination: General appearance: 71 y.o., female, unresponsive Eyes: bruising/swelling over R eye, R pupil dilated, L pupil 2-2mm sluggishly reactive HENT: MMM Neck: Trachea midline; no lymphadenopathy, no JVD, c-collar in place Lungs: rhonchorous bilaterally, equal chest rise CV: bradycardic, RR, no murmur  Abdomen: Soft, non-tender; non-distended, BS hypoactive Extremities: trace peripheral edema, warm Skin: Normal turgor and  texture; no rash Neuro: decorticate posturing, extends toes with noxious stim [facility] Problem list     Assessment & Plan:   # SDH  # TBI S/p andexxanet in ED - neurosurgery consulted - surveillance cth timing, SBP goal, sz prophylaxis per NSGY - frequent neuro checks - Neuroprotective measures: HOB > 30 degrees, normoglycemia, normothermia, eucapnia, correct electrolytes  # Acute respiratory failure with hypoxia: - full vent support - target eucapnia as above - keep plat <30, avoid peep >12 as able - keep sedation light - WUA/SBT in AM  # Hypertensive emergency: - wean cleviprex for SBP goal per NSGY  # ESRD - nephro consult  Best Practice (right click and "Reselect all SmartList Selections" daily)   Diet/type: NPO w/ meds via tube DVT prophylaxis: not indicated GI prophylaxis: N/A Lines: N/A Foley:  N/A Code Status:  full code Last date of multidisciplinary goals of care discussion [family updated at bedside this evening]  Labs   CBC: Recent Labs  Lab 04/30/2021 1614 05/02/2021 1616  WBC 6.1  --   HGB 13.1 15.6*  HCT 42.6 46.0  MCV 88.9  --   PLT 187  --     Basic Metabolic Panel: Recent Labs  Lab 05/04/2021 1616  NA 135  K 4.1  CL 96*  GLUCOSE 213*  BUN 14  CREATININE 1.60*   GFR: Estimated Creatinine Clearance: 30.9 mL/min (A) (by C-G formula based on SCr of 1.6 mg/dL (H)). Recent Labs  Lab 04/21/2021 1614  WBC 6.1    Liver Function Tests: No results for input(s): AST, ALT, ALKPHOS, BILITOT, PROT, ALBUMIN in the last 168 hours. No results for input(s): LIPASE, AMYLASE in the last 168 hours. No results for input(s): AMMONIA in the last 168 hours.  ABG    Component Value Date/Time   TCO2 31 05/09/2021 1616     Coagulation Profile: Recent Labs  Lab 04/22/2021 1614  INR 1.6*    Cardiac Enzymes: No results for input(s): CKTOTAL, CKMB, CKMBINDEX, TROPONINI in the last 168 hours.  HbA1C: No results found for:  HGBA1C  CBG: No results for input(s): GLUCAP in the last 168 hours.  Review of Systems:   Unable to obtain in setting of her encephalopathy  Past Medical History:  She,  has no past medical history on file.   Surgical History:     Social History:      Family History:  Her family history is not on file.   Allergies Not on File   Home Medications  Prior to Admission medications   Not on File     Critical care time: 37 minutes

## 2021-05-07 NOTE — Progress Notes (Signed)
Orthopedic Tech Progress Note Patient Details:  Molly [name] 30-Apr-1950 [id number]  Level 1 trauma  Patient ID: [name], female   DOB: [birth year], 71 y.o.   MRN: [id number]  Carin Primrose 05/05/2021, 4:35 PM

## 2021-05-07 NOTE — ED Notes (Signed)
Trauma Response Nurse Documentation   Molly [name] is a 71 y.o. female arriving to [facility] ED via EMS  On Eliquis (apixaban) daily. Trauma was activated as a Level 1 by ED charge RN based on the following trauma criteria GCS < 9 and assisted ventilations. Trauma team at the bedside on patient arrival. Patient cleared for CT by Dr. Rosendo Gros. Patient to CT with team. GCS 4.  History   History reviewed. No pertinent past medical history.   History reviewed. No pertinent surgical history.     Initial Focused Assessment (If applicable, or please see trauma documentation): - GCS 4-5 w/ decerebrate posturing - Assisted ventilations via BVM - nasal trumpet in place - R pupil blown and nonreactive / L pupil pinpoint - R eye swelling/bruising  CT's Completed:   CT Head, CT Maxillofacial, CT C-Spine, CT Chest w/ contrast, and CT abdomen/pelvis w/ contrast   Interventions:  - Placed C-collar - intubated by EDP  - OG tube placed - x3 IVs placed - Trauma labs - CXR/pelvic XR - CT pan scan - Placed 66F temp foley - propofol gtt i - fentanyl gtt  - labetalol given - Cleviprex gtt - TXA given - Andexxa given - Took pt to [place] with pt's husband - updated him  Plan for disposition:  OR then admit to 4NICU  Consults completed:  Neurosurgeon at 1710. Dr Christella Noa  Event Summary: Pt is on eliquis and had a fall this am striking her head.  She proceeded to dialysis due to "feeling fine" per husband.  Had episodes of dizziness while at dialysis and after she was taken home by her husband she went unresponsive resulting in him calling EMS.  Upon arrival to ED pt was decerebrate posturing and R pupil blown.  Pt being bagged on arrival.   MTP Summary (If applicable): n/a  Bedside handoff with ED RN Thurmond Butts.    Clovis Cao  Trauma Response RN  Please call TRN at [phone] for further assistance.

## 2021-05-07 NOTE — ED Provider Notes (Signed)
[facility] EMERGENCY DEPARTMENT Provider Note   CSN: [id number] Arrival date & time: 04/22/2021  1606     History  Chief Complaint  Patient presents with   Molly [name]    Molly [name] is a 71 y.o. female.  HPI      71 year old female with history significant for ESRD on dialysis Tuesday Thursday Saturday, significant peripheral arterial disease on Eliquis, diabetes, hypertension presents as a level 1 trauma.  History is limited given patient unresponsive.  History obtained from husband reports that she was in her normal health state of health this morning when she dropped something and went to pick it up and slipped hitting her head.  She hit her face, had no loss of consciousness, however had immediate bruising and swelling to the left side of her face around her eye.  She reported she felt okay and went to dialysis.  After about an hour and a half of dialysis she had some urinary incontinence, and he went home to get her dry close, they did not complete dialysis, and he brought her home.  Reports that after returning home she had generalized weakness, but continued to say "no no no" when he suggested they go to the hospital. She progressively worsened, became unresponsive, and EMS was called.  Per EMS, she had asymmetric pupils on their arrival.  They began assisting her respiration with BVM.    Home Medications Prior to Admission medications   Not on File      Allergies    Mercury    Review of Systems   Review of Systems  Unable to perform ROS: Mental status change    Physical Exam Updated Vital Signs BP (!) 151/59    Pulse 60    Temp 98.1 F (36.7 C)    Resp 12    Ht 5' 2" (1.575 m)    Wt 70 kg    SpO2 100%    BMI 28.23 kg/m  Physical Exam Vitals and nursing note reviewed.  Constitutional:      Appearance: She is well-developed. She is ill-appearing. She is not diaphoretic.  HENT:     Head: Normocephalic.     Comments: Right pupil 3-59m, left pupil  1-234mContusion/periorbital ecchymoses and edema right periorbital Eyes:     Conjunctiva/sclera: Conjunctivae normal.  Cardiovascular:     Rate and Rhythm: Normal rate and regular rhythm.     Heart sounds: Normal heart sounds. No murmur heard.   No friction rub. No gallop.  Pulmonary:     Effort: Pulmonary effort is normal. No respiratory distress.     Breath sounds: Normal breath sounds. No wheezing or rales.  Abdominal:     General: There is no distension.     Palpations: Abdomen is soft.     Tenderness: There is no abdominal tenderness. There is no guarding.  Musculoskeletal:        General: No tenderness.     Cervical back: Normal range of motion.  Skin:    General: Skin is warm and dry.     Findings: Bruising (face, left upper chest/clavicular area) present. No erythema or rash.     Comments: Chronic wound  Neurological:     Mental Status: She is lethargic.     GCS: GCS eye subscore is 1. GCS verbal subscore is 1. GCS motor subscore is 2.     Comments: All 4 extremities with extension to pain.    ED Results / Procedures / Treatments  Labs (all labs ordered are listed, but only abnormal results are displayed) Labs Reviewed  URINALYSIS, ROUTINE W REFLEX MICROSCOPIC - Abnormal; Notable for the following components:      Result Value   Glucose, UA 150 (*)    Hgb urine dipstick MODERATE (*)    Ketones, ur 5 (*)    Protein, ur 100 (*)    Bacteria, UA RARE (*)    All other components within normal limits  LACTIC ACID, PLASMA - Abnormal; Notable for the following components:   Lactic Acid, Venous 2.0 (*)    All other components within normal limits  PROTIME-INR - Abnormal; Notable for the following components:   Prothrombin Time 18.7 (*)    INR 1.6 (*)    All other components within normal limits  GLUCOSE, CAPILLARY - Abnormal; Notable for the following components:   Glucose-Capillary 151 (*)    All other components within normal limits  I-STAT CHEM 8, ED - Abnormal;  Notable for the following components:   Chloride 96 (*)    Creatinine, Ser 1.60 (*)    Glucose, Bld 213 (*)    Calcium, Ion 1.05 (*)    Hemoglobin 15.6 (*)    All other components within normal limits  POCT I-STAT 7, (LYTES, BLD GAS, ICA,H+H) - Abnormal; Notable for the following components:   pH, Arterial 7.578 (*)    pCO2 arterial 28.1 (*)    pO2, Arterial 241 (*)    Acid-Base Excess 5.0 (*)    Sodium 134 (*)    Potassium 3.1 (*)    Calcium, Ion 1.08 (*)    All other components within normal limits  RESP PANEL BY RT-PCR (FLU A&B, COVID) ARPGX2  MRSA NEXT GEN BY PCR, NASAL  CBC  ETHANOL  HEMOGLOBIN A1C  SAMPLE TO BLOOD BANK    EKG None  Radiology CT HEAD WO CONTRAST  Result Date: 04/21/2021 CLINICAL DATA:  71 year old female with syncope with head, facial and neck injury and altered mental status. Patient on Eliquis. EXAM: CT HEAD WITHOUT CONTRAST CT MAXILLOFACIAL WITHOUT CONTRAST CT CERVICAL SPINE WITHOUT CONTRAST TECHNIQUE: Multidetector CT imaging of the head, cervical spine, and maxillofacial structures were performed using the standard protocol without intravenous contrast. Multiplanar CT image reconstructions of the cervical spine and maxillofacial structures were also generated. RADIATION DOSE REDUCTION: This exam was performed according to the departmental dose-optimization program which includes automated exposure control, adjustment of the mA and/or kV according to patient size and/or use of iterative reconstruction technique. COMPARISON:  None. FINDINGS: CT HEAD FINDINGS Brain: A hyperacute/acute subdural hematoma is noted overlying the majority of the RIGHT cerebral hemisphere and measuring up to 1.7 cm in greatest diameter. There is 1.7 cm RIGHT to LEFT midline shift with enlargement of the LEFT ventricle. Effacement of the basilar cisterns is also noted. No acute infarction is identified. Vascular: Carotid atherosclerotic calcifications are noted. Skull: No acute fracture  identified. Other: RIGHT facial soft tissue swelling/hemorrhage is noted. CT MAXILLOFACIAL FINDINGS Osseous: No fracture or mandibular dislocation. No destructive process. Orbits: Negative. No traumatic or inflammatory finding. Sinuses: No acute abnormality Soft tissues: Moderate RIGHT facial soft tissue swelling/hemorrhage is noted. CT CERVICAL SPINE FINDINGS Alignment: Normal. Skull base and vertebrae: No acute fracture. No primary bone lesion or focal pathologic process. Soft tissues and spinal canal: No prevertebral fluid or swelling. No visible canal hematoma. Disc levels:  Unremarkable Upper chest: No acute fracture Other: Endotracheal tube and orogastric tube identified. IMPRESSION: 1. 1.7 cm hyperacute/acute RIGHT subdural hematoma  overlying the majority of the RIGHT cerebral hemisphere with mass effect causing 1.7 cm RIGHT to LEFT midline shift, enlargement of the LEFT ventricle and effacement of the basilar cisterns. 2. RIGHT facial soft tissue swelling/hemorrhage without facial fracture. 3. No static evidence of acute injury to the cervical spine. Critical Value/emergent results were called by telephone at the time of interpretation on 05/02/2021 at 5:05 pm to provider Dr. Rosendo Gros, who verbally acknowledged these results. Electronically Signed   By: Margarette Canada M.D.   On: 04/28/2021 17:16   CT CERVICAL SPINE WO CONTRAST  Result Date: 05/06/2021 CLINICAL DATA:  71 year old female with syncope with head, facial and neck injury and altered mental status. Patient on Eliquis. EXAM: CT HEAD WITHOUT CONTRAST CT MAXILLOFACIAL WITHOUT CONTRAST CT CERVICAL SPINE WITHOUT CONTRAST TECHNIQUE: Multidetector CT imaging of the head, cervical spine, and maxillofacial structures were performed using the standard protocol without intravenous contrast. Multiplanar CT image reconstructions of the cervical spine and maxillofacial structures were also generated. RADIATION DOSE REDUCTION: This exam was performed according to  the departmental dose-optimization program which includes automated exposure control, adjustment of the mA and/or kV according to patient size and/or use of iterative reconstruction technique. COMPARISON:  None. FINDINGS: CT HEAD FINDINGS Brain: A hyperacute/acute subdural hematoma is noted overlying the majority of the RIGHT cerebral hemisphere and measuring up to 1.7 cm in greatest diameter. There is 1.7 cm RIGHT to LEFT midline shift with enlargement of the LEFT ventricle. Effacement of the basilar cisterns is also noted. No acute infarction is identified. Vascular: Carotid atherosclerotic calcifications are noted. Skull: No acute fracture identified. Other: RIGHT facial soft tissue swelling/hemorrhage is noted. CT MAXILLOFACIAL FINDINGS Osseous: No fracture or mandibular dislocation. No destructive process. Orbits: Negative. No traumatic or inflammatory finding. Sinuses: No acute abnormality Soft tissues: Moderate RIGHT facial soft tissue swelling/hemorrhage is noted. CT CERVICAL SPINE FINDINGS Alignment: Normal. Skull base and vertebrae: No acute fracture. No primary bone lesion or focal pathologic process. Soft tissues and spinal canal: No prevertebral fluid or swelling. No visible canal hematoma. Disc levels:  Unremarkable Upper chest: No acute fracture Other: Endotracheal tube and orogastric tube identified. IMPRESSION: 1. 1.7 cm hyperacute/acute RIGHT subdural hematoma overlying the majority of the RIGHT cerebral hemisphere with mass effect causing 1.7 cm RIGHT to LEFT midline shift, enlargement of the LEFT ventricle and effacement of the basilar cisterns. 2. RIGHT facial soft tissue swelling/hemorrhage without facial fracture. 3. No static evidence of acute injury to the cervical spine. Critical Value/emergent results were called by telephone at the time of interpretation on 05/04/2021 at 5:05 pm to provider Dr. Rosendo Gros, who verbally acknowledged these results. Electronically Signed   By: Margarette Canada M.D.    On: 04/19/2021 17:16   DG Pelvis Portable  Result Date: 04/21/2021 CLINICAL DATA:  Trauma, fall EXAM: PORTABLE PELVIS 1-2 VIEWS COMPARISON:  None. FINDINGS: No displaced fracture is seen. There is previous internal fixation in the proximal left femur. Lateral aspect of left iliac bone ischial bones and lateral aspect of proximal left tibia are not fully included in the study limiting evaluation. Left ureteral stent is noted in place. There is mild dilation of small-bowel loops measuring up to 3.1 cm in diameter. Gas is present in the rectum. IMPRESSION: No definite displaced fracture is seen in the limited AP view of pelvis. Electronically Signed   By: Elmer Picker M.D.   On: 05/09/2021 16:59   CT CHEST ABDOMEN PELVIS W CONTRAST  Result Date: 04/21/2021 CLINICAL DATA:  71 year old female  with fall and chest, abdomen and pelvic injury. Patient with chronic renal failure on dialysis. EXAM: CT CHEST, ABDOMEN, AND PELVIS WITH CONTRAST TECHNIQUE: Multidetector CT imaging of the chest, abdomen and pelvis was performed following the standard protocol during bolus administration of intravenous contrast. RADIATION DOSE REDUCTION: This exam was performed according to the departmental dose-optimization program which includes automated exposure control, adjustment of the mA and/or kV according to patient size and/or use of iterative reconstruction technique. CONTRAST:  101m OMNIPAQUE IOHEXOL 350 MG/ML SOLN COMPARISON:  01/10/2016 chest CT and 06/21/2015 abdomen/pelvic CT FINDINGS: CT CHEST FINDINGS Cardiovascular: Cardiomegaly again noted. Heavy coronary artery and thoracic aortic atherosclerotic calcification again identified. There is no evidence of thoracic aortic aneurysm, definite dissection or pericardial effusion. Mediastinum/Nodes: An endotracheal tube and OG tube are present. There is no evidence of mediastinal hematoma or mass. No abnormal lymph nodes are identified. Lungs/Pleura: Bibasilar atelectasis  and trace RIGHT pleural effusion noted. Patchy ground-glass and mild airspace opacities within the RIGHT UPPER lobe and RIGHT LOWER lobe noted which may represent aspiration or less likely contusions. There is no evidence of pneumothorax. No discrete mass or pulmonary nodules noted. Musculoskeletal: No acute or suspicious bony abnormalities are noted. CT ABDOMEN PELVIS FINDINGS Hepatobiliary: The liver is unremarkable. The patient is status post cholecystectomy. There is no evidence of intrahepatic or extrahepatic biliary dilatation. Pancreas: A 1.2 cm cyst within the pancreatic body is noted. No other abnormalities are identified. Spleen: No definite abnormality. Heterogeneity on arterial phase images is likely normal differential flow. Adrenals/Urinary Tract: Kidneys are small and slightly heterogeneous with cortical atrophy. A LEFT urinary stent noted with tips in the LEFT renal pelvis and bladder. No definite solid mass or hydronephrosis noted. The adrenal glands and bladder are otherwise unremarkable. Stomach/Bowel: Stomach is within normal limits except for NG tube. No evidence of bowel wall thickening, distention, or inflammatory changes. Vascular/Lymphatic: Heavy abdominal aortic atherosclerotic calcification and plaque noted without aneurysm. LEFT femoral bypass graft is noted. No enlarged lymph nodes are identified. Reproductive: Uterus and bilateral adnexa are unremarkable. Other: No ascites, focal collection or pneumoperitoneum. Musculoskeletal: No acute or suspicious bony abnormalities are noted. Surgical hardware within proximal LEFT femur is noted. IMPRESSION: 1. No evidence of acute injury within the abdomen or pelvis. 2. Patchy ground-glass and mild airspace opacities within the RIGHT UPPER and RIGHT LOWER lobes which may represent aspiration or less likely contusions. No evidence of acute fracture. 3. Bibasilar atelectasis and trace RIGHT pleural effusion. 4. Cardiomegaly and coronary artery  disease. 5. LEFT urinary stent. 6. 1.2 cm pancreatic body cyst. Consider 1 year follow-up CT or MRI as clinically indicated. 7. Aortic Atherosclerosis (ICD10-I70.0). Electronically Signed   By: JMargarette CanadaM.D.   On: 05/13/2021 17:30   DG Chest Port 1 View  Result Date: 04/25/2021 CLINICAL DATA:  Fall EXAM: PORTABLE CHEST 1 VIEW COMPARISON:  None. FINDINGS: Cardiomegaly. Mild, diffuse bilateral interstitial pulmonary opacity. Right neck large bore multi lumen vascular catheter. Endotracheal tube with tip above the carina. Esophagogastric tube with tip and side port below the diaphragm. No displaced rib fracture or pneumothorax. IMPRESSION: 1. Cardiomegaly with mild, diffuse bilateral interstitial pulmonary opacity, most likely edema. 2. Endotracheal tube with tip above the carina. 3. Esophagogastric tube with tip and side port below the diaphragm. Electronically Signed   By: ADelanna AhmadiM.D.   On: 05/05/2021 16:57   CT MAXILLOFACIAL WO CONTRAST  Result Date: 04/27/2021 CLINICAL DATA:  71year old female with syncope with head, facial and neck injury  and altered mental status. Patient on Eliquis. EXAM: CT HEAD WITHOUT CONTRAST CT MAXILLOFACIAL WITHOUT CONTRAST CT CERVICAL SPINE WITHOUT CONTRAST TECHNIQUE: Multidetector CT imaging of the head, cervical spine, and maxillofacial structures were performed using the standard protocol without intravenous contrast. Multiplanar CT image reconstructions of the cervical spine and maxillofacial structures were also generated. RADIATION DOSE REDUCTION: This exam was performed according to the departmental dose-optimization program which includes automated exposure control, adjustment of the mA and/or kV according to patient size and/or use of iterative reconstruction technique. COMPARISON:  None. FINDINGS: CT HEAD FINDINGS Brain: A hyperacute/acute subdural hematoma is noted overlying the majority of the RIGHT cerebral hemisphere and measuring up to 1.7 cm in greatest  diameter. There is 1.7 cm RIGHT to LEFT midline shift with enlargement of the LEFT ventricle. Effacement of the basilar cisterns is also noted. No acute infarction is identified. Vascular: Carotid atherosclerotic calcifications are noted. Skull: No acute fracture identified. Other: RIGHT facial soft tissue swelling/hemorrhage is noted. CT MAXILLOFACIAL FINDINGS Osseous: No fracture or mandibular dislocation. No destructive process. Orbits: Negative. No traumatic or inflammatory finding. Sinuses: No acute abnormality Soft tissues: Moderate RIGHT facial soft tissue swelling/hemorrhage is noted. CT CERVICAL SPINE FINDINGS Alignment: Normal. Skull base and vertebrae: No acute fracture. No primary bone lesion or focal pathologic process. Soft tissues and spinal canal: No prevertebral fluid or swelling. No visible canal hematoma. Disc levels:  Unremarkable Upper chest: No acute fracture Other: Endotracheal tube and orogastric tube identified. IMPRESSION: 1. 1.7 cm hyperacute/acute RIGHT subdural hematoma overlying the majority of the RIGHT cerebral hemisphere with mass effect causing 1.7 cm RIGHT to LEFT midline shift, enlargement of the LEFT ventricle and effacement of the basilar cisterns. 2. RIGHT facial soft tissue swelling/hemorrhage without facial fracture. 3. No static evidence of acute injury to the cervical spine. Critical Value/emergent results were called by telephone at the time of interpretation on 04/29/2021 at 5:05 pm to provider Dr. Rosendo Gros, who verbally acknowledged these results. Electronically Signed   By: Margarette Canada M.D.   On: 04/25/2021 17:16    Procedures Procedure Name: Intubation Date/Time: 05/08/2021 2:29 AM Performed by: Gareth Morgan, MD Pre-anesthesia Checklist: Patient identified and Emergency Drugs available Oxygen Delivery Method: Ambu bag Preoxygenation: Pre-oxygenation with 100% oxygen Induction Type: IV induction Ventilation: Mask ventilation without difficulty Tube size: 6.5  mm Number of attempts: 3 Placement Confirmation: ETT inserted through vocal cords under direct vision Tube secured with: ETT holder Difficulty Due To: Difficult Airway-  due to edematous airway and Difficult Airway-due to vocal cord/laryngeal edema Comments: Small edematous airway, attempted with 7.5, 7.0, BVM between attempts, obtained 6.5ETT    .Critical Care Performed by: Gareth Morgan, MD Authorized by: Gareth Morgan, MD   Critical care provider statement:    Critical care time (minutes):  45   Critical care was time spent personally by me on the following activities:  Development of treatment plan with patient or surrogate, discussions with consultants, evaluation of patient's response to treatment, examination of patient, ordering and review of laboratory studies, ordering and review of radiographic studies, ordering and performing treatments and interventions, pulse oximetry, re-evaluation of patient's condition and review of old charts    Medications Ordered in ED Medications  tranexamic acid (CYKLOKAPRON) IVPB 1,000 mg (0 mg Intravenous Stopped 04/27/2021 1656)    Followed by  tranexamic acid (CYKLOKAPRON) 1,000 mg in sodium chloride 0.9 % 500 mL infusion ( Intravenous MAR Unhold 04/21/2021 2042)  propofol (DIPRIVAN) 1000 MG/100ML infusion (0 mcg/kg/min  70 kg Intravenous  Stopped 04/18/2021 1851)  fentaNYL 2538mg in NS 2555m(1032mml) infusion-PREMIX (0 mcg/hr Intravenous Stopped 04/26/2021 1901)  0.9 % NaCl with KCl 20 mEq/ L  infusion ( Intravenous Not Given 04/22/2021 2255)  HYDROcodone-acetaminophen (NORCO/VICODIN) 5-325 MG per tablet 1 tablet (has no administration in time range)  morphine 2 MG/ML injection 1-2 mg (has no administration in time range)  naloxone (NARCAN) injection 0.08 mg (has no administration in time range)  ondansetron (ZOFRAN) tablet 4 mg (has no administration in time range)    Or  ondansetron (ZOFRAN) injection 4 mg (has no administration in time range)   labetalol (NORMODYNE) injection 10-40 mg (has no administration in time range)  levETIRAcetam (KEPPRA) IVPB 500 mg/100 mL premix (0 mg Intravenous Stopped 04/29/2021 2244)  pantoprazole (PROTONIX) injection 40 mg (40 mg Intravenous Given 04/20/2021 2216)  ceFAZolin (ANCEF) IVPB 1 g/50 mL premix (1 g Intravenous New Bag/Given 05/08/21 0227)  Chlorhexidine Gluconate Cloth 2 % PADS 6 each (6 each Topical Given 04/23/2021 2201)  chlorhexidine gluconate (MEDLINE KIT) (PERIDEX) 0.12 % solution 15 mL (15 mLs Mouth Rinse Given 04/17/2021 2047)  MEDLINE mouth rinse (15 mLs Mouth Rinse Given 04/21/2021 2201)  insulin aspart (novoLOG) injection 0-6 Units (has no administration in time range)  clevidipine (CLEVIPREX) infusion 0.5 mg/mL (1 mg/hr Intravenous New Bag/Given 05/08/21 0224)  etomidate (AMIDATE) injection (15 mg Intravenous Given 04/24/2021 1612)  succinylcholine (ANECTINE) injection (100 mg Intravenous Given 05/06/2021 1612)  iohexol (OMNIPAQUE) 350 MG/ML injection 80 mL (80 mLs Intravenous Contrast Given 05/13/2021 1635)  coag fact Xa recombinant (ANDEXXA) low dose infusion 900 mg (900 mg Intravenous New Bag/Given 04/19/2021 1706)  labetalol (NORMODYNE) injection 10 mg (10 mg Intravenous Given 04/22/2021 1630)  fentaNYL (SUBLIMAZE) 100 MCG/2ML injection (100 mcg  Given 05/16/2021 1652)  levETIRAcetam (KEPPRA) IVPB 1000 mg/100 mL premix (1,000 mg Intravenous Given 05/14/2021 2011)  labetalol (NORMODYNE) injection 10-20 mg (10 mg Intravenous Given 05/08/21 0103)    ED Course/ Medical Decision Making/ A&P                           Medical Decision Making Amount and/or Complexity of Data Reviewed Labs: ordered. Radiology: ordered.  Risk Prescription drug management.    [age]ar old female with history significant for ESRD on dialysis Tuesday Thursday Saturday, significant peripheral arterial disease on Eliquis, diabetes, hypertension presents as a level 1 trauma.  Intubated on arrival for airway protection. Given empiric  TXA per Dr. RamRosendo GrosTaken to CT for CT head, CSpine, chest abdomen pelvis.   CT completed and independently reviewed by me and shows 1.7cm hyperacute/acute right subdural hematoma overlying the right cerebral hemisphere with mass effect and 1.7cm right to left midline shift, enlargement of the left ventrical. Given ANDEXXA for eliquis reversal.  Trauma Surgery Dr. RamRosendo Grosoke with Dr. CabChristella Noaurosurgery. Dr. CabChristella Noa evaluate and bring to OR.  I updated husband prior to [facility] evaluation.  Consulted PCCM for possible admission given other medical problems, no other traumatic injuries.          Final Clinical Impression(s) / ED Diagnoses Final diagnoses:  Trauma  SDH (subdural hematoma)  Unresponsive    Rx / DC Orders ED Discharge Orders     None         SchGareth MorganD 05/08/21 [id number]

## 2021-05-07 NOTE — ED Notes (Signed)
C-spine cleared, c-collar removed PER MD

## 2021-05-07 NOTE — Anesthesia Procedure Notes (Signed)
Arterial Line Insertion Start/End01/29/2023 6:30 PM, 04/19/2021 6:40 PM Performed by: Murvin Natal, MD, anesthesiologist  Patient location: OR. Preanesthetic checklist: patient identified, IV checked, site marked, risks and benefits discussed, surgical consent, monitors and equipment checked, pre-op evaluation, timeout performed and anesthesia consent Lidocaine 1% used for infiltration Right, radial was placed Catheter size: 20 G Hand hygiene performed , maximum sterile barriers used  and Seldinger technique used  Attempts: 1 Procedure performed using ultrasound guided technique. Ultrasound Notes:anatomy identified, needle tip was noted to be adjacent to the nerve/plexus identified and no ultrasound evidence of intravascular and/or intraneural injection Following insertion, dressing applied and Biopatch. Post procedure assessment: normal and unchanged  Patient tolerated the procedure well with no immediate complications.

## 2021-05-07 NOTE — Op Note (Signed)
04/22/2021  8:40 PM  PATIENT:  Molly [name]  71 y.o. female Presenting with an acute subdural hematoma on the right side PRE-OPERATIVE DIAGNOSIS:  acute subdural hematoma, right panhemispheric  POST-OPERATIVE DIAGNOSIS:  acute subdural hematoma, right panhemispheric  PROCEDURE:  Procedure(s):right parietal CRANIOTOMY HEMATOMA EVACUATION SUBDURAL  SURGEON: Surgeon(s): Ashok Pall, MD  ASSISTANTS:none  ANESTHESIA:   general  EBL:  Total I/O In: 500 [I.V.:400; IV Piggyback:100] Out: 200 [Blood:200]  BLOOD ADMINISTERED:none  CELL SAVER GIVEN:none used  COUNT:correct per nursing  DRAINS: none   SPECIMEN:  No Specimen  DICTATION: [name] was taken to the operating room, and placed under a general anesthetic without difficulty. She was positioned supine on the operating room table. Her head was prepped and draped in a sterile manner. I placed her head in a [place] head holder, then attached her head to the [facility] adapter on the bed.  I infiltrated lidocaine in the planned incision, a linear incision along the right parietal boss. I placed Raney clips on the scalp edges. I placed a cerebellar retractor to expose the skull. I created two burr holes on each end of the skull and connected them with a craniotome to create the flap. The dura was adherent to the skull and was removed with it. I encountered thick clotted blood on the frontal, parietal, and temporal lobes. I used suction to remove the blood. Some bleeding points on the brain were coagulated. I irrigated copiously. The brain was pulsatile and I started to close. I placed duragen on the brain surface where the dura was torn. I replaced the skull flap with plates and screws. I approximated the galea, then the scalp edges. I placed a sterile dressing . I removed her head from the head holder. She was moved to the bed and taken directly to the ICU.  PLAN OF CARE: Admit to inpatient   PATIENT DISPOSITION:  ICU - intubated  and critically ill.   Delay start of Pharmacological VTE agent (>24hrs) due to surgical blood loss or risk of bleeding:  yes

## 2021-05-07 NOTE — ED Notes (Signed)
Pt to CT

## 2021-05-07 NOTE — Progress Notes (Signed)
°   05/14/2021 1556  Clinical Encounter Type  Visited With Patient not available  Visit Type Trauma  Referral From Nurse  Consult/Referral To Chaplain   Chaplain Jorene Guest responded to Level 1 page. The patient is being attended to by the medical team. There are no support person present. Chaplain remains available for follow up spiritual/emotional support as needed. This note was prepared by Jeanine Luz, M.Div..  For questions please contact by phone [phone].

## 2021-05-07 NOTE — Progress Notes (Signed)
Patient transported to CT and back without complications. RN at bedside.  

## 2021-05-07 NOTE — Anesthesia Preprocedure Evaluation (Addendum)
Anesthesia Evaluation  Patient identified by MRN, date of birth, ID band Patient unresponsive    Reviewed: Allergy & Precautions, Patient's Chart, lab work & pertinent test results, Unable to perform ROS - Chart review onlyPreop documentation limited or incomplete due to emergent nature of procedure.  Airway Mallampati: Intubated       Dental   Pulmonary sleep apnea ,  Intubated      + intubated    Cardiovascular + Peripheral Vascular Disease and +CHF  Normal cardiovascular exam  ECG: SR, rate 73   Neuro/Psych    GI/Hepatic   Endo/Other    Renal/GU Dialysis and ESRFRenal disease     Musculoskeletal   Abdominal   Peds  Hematology   Anesthesia Other Findings acute subdural hematoma  Reproductive/Obstetrics                            Anesthesia Physical Anesthesia Plan  ASA: 4 and emergent  Anesthesia Plan: General   Post-op Pain Management:    Induction:   PONV Risk Score and Plan: 3 and Ondansetron, Dexamethasone and Treatment may vary due to age or medical condition  Airway Management Planned: Oral ETT  Additional Equipment: Arterial line  Intra-op Plan:   Post-operative Plan: Post-operative intubation/ventilation  Informed Consent:     Only emergency history available  Plan Discussed with: Surgeon and CRNA  Anesthesia Plan Comments:        Anesthesia Quick Evaluation

## 2021-05-07 NOTE — Progress Notes (Signed)
At bedside

## 2021-05-07 NOTE — Progress Notes (Signed)
eLink Physician-Brief Progress Note Patient Name: Molly [name] DOB: Aug 02, 1950 MRN: [id number]   Date of Service  04/27/2021  HPI/Events of Note  70yF with ESRD, PAD, chronic diastolic heart failure, OSA, on eliquis for AF -> hit her head on a dresser. Became more drowsy, found to have traumatic SDH with midline shift, she was intubated and given 1g TXA, andexxanet.   Now s/p right parietal craniotomy for hematoma evacuation. Got Clevidipine for goal SBP < 150 per neurosurx   eICU Interventions  Pt seen spoke with RN bedside. BP goals for Traumatic Bleed 110-140 SBP - Currently running around 381 systolic. Will hold Clevidipine. May need low dose pressor to prevent loss of autoregulation/cerebral perfusion pressure, if she does not pick up, but expect her to bump up with d/c of Clevidipine.   -start sliding scale insulin- infusion if > 180 -prn BP control with pushes  will try to keep off drip.  -Keppra for seizure prophylaxis         Mathew Storck N Fenton Candee 04/19/2021, 11:09 PM

## 2021-05-07 NOTE — Transfer of Care (Signed)
Immediate Anesthesia Transfer of Care Note  Patient: [name]  Procedure(s) Performed: CRANIOTOMY HEMATOMA EVACUATION SUBDURAL (Right: Head)  Patient Location: ICU  Anesthesia Type:General  Level of Consciousness: Patient remains intubated per anesthesia plan  Airway & Oxygen Therapy: Patient remains intubated per anesthesia plan and Patient placed on Ventilator (see vital sign flow sheet for setting)  Post-op Assessment: Report given to RN and Post -op Vital signs reviewed and stable  Post vital signs: Reviewed and stable  Last Vitals:  Vitals Value Taken Time  BP 144/53 05/02/2021 2103  Temp 34.5 C 05/06/2021 2105  Pulse 59 04/25/2021 2105  Resp 18 04/24/2021 2105  SpO2 100 % 05/05/2021 2105  Vitals shown include unvalidated device data.  Last Pain:  Vitals:   05/15/2021 1755  TempSrc: Temporal  PainSc:          Complications: No notable events documented.

## 2021-05-07 NOTE — Progress Notes (Signed)
Patient transported to the OR from ED without complications. RN at bedside.

## 2021-05-08 ENCOUNTER — Other Ambulatory Visit: Payer: Self-pay

## 2021-05-08 DIAGNOSIS — S065XAA Traumatic subdural hemorrhage with loss of consciousness status unknown, initial encounter: Secondary | ICD-10-CM | POA: Diagnosis not present

## 2021-05-08 LAB — BASIC METABOLIC PANEL
Anion gap: 13 (ref 5–15)
BUN: 18 mg/dL (ref 8–23)
CO2: 23 mmol/L (ref 22–32)
Calcium: 8.1 mg/dL — ABNORMAL LOW (ref 8.9–10.3)
Chloride: 96 mmol/L — ABNORMAL LOW (ref 98–111)
Creatinine, Ser: 2.27 mg/dL — ABNORMAL HIGH (ref 0.44–1.00)
GFR, Estimated: 23 mL/min — ABNORMAL LOW (ref >60)
Glucose, Bld: 144 mg/dL — ABNORMAL HIGH (ref 70–99)
Potassium: 3.4 mmol/L — ABNORMAL LOW (ref 3.5–5.1)
Sodium: 132 mmol/L — ABNORMAL LOW (ref 135–145)

## 2021-05-08 LAB — TRIGLYCERIDES: Triglycerides: 136 mg/dL (ref <150)

## 2021-05-08 LAB — POCT I-STAT 7, (LYTES, BLD GAS, ICA,H+H)
Acid-Base Excess: 1 mmol/L (ref 0.0–2.0)
Bicarbonate: 26.1 mmol/L (ref 20.0–28.0)
Calcium, Ion: 1.04 mmol/L — ABNORMAL LOW (ref 1.15–1.40)
HCT: 34 % — ABNORMAL LOW (ref 36.0–46.0)
Hemoglobin: 11.6 g/dL — ABNORMAL LOW (ref 12.0–15.0)
O2 Saturation: 99 %
Patient temperature: 98.4
Potassium: 3.4 mmol/L — ABNORMAL LOW (ref 3.5–5.1)
Sodium: 136 mmol/L (ref 135–145)
TCO2: 27 mmol/L (ref 22–32)
pCO2 arterial: 41 mmHg (ref 32.0–48.0)
pH, Arterial: 7.412 (ref 7.350–7.450)
pO2, Arterial: 122 mmHg — ABNORMAL HIGH (ref 83.0–108.0)

## 2021-05-08 LAB — CBC
HCT: 35.1 % — ABNORMAL LOW (ref 36.0–46.0)
Hemoglobin: 11 g/dL — ABNORMAL LOW (ref 12.0–15.0)
MCH: 27.4 pg (ref 26.0–34.0)
MCHC: 31.3 g/dL (ref 30.0–36.0)
MCV: 87.5 fL (ref 80.0–100.0)
Platelets: 161 10*3/uL (ref 150–400)
RBC: 4.01 MIL/uL (ref 3.87–5.11)
RDW: 15.6 % — ABNORMAL HIGH (ref 11.5–15.5)
WBC: 7.8 10*3/uL (ref 4.0–10.5)
nRBC: 0 % (ref 0.0–0.2)

## 2021-05-08 LAB — GLUCOSE, CAPILLARY
Glucose-Capillary: 150 mg/dL — ABNORMAL HIGH (ref 70–99)
Glucose-Capillary: 139 mg/dL — ABNORMAL HIGH (ref 70–99)
Glucose-Capillary: 150 mg/dL — ABNORMAL HIGH (ref 70–99)
Glucose-Capillary: 141 mg/dL — ABNORMAL HIGH (ref 70–99)
Glucose-Capillary: 170 mg/dL — ABNORMAL HIGH (ref 70–99)
Glucose-Capillary: 175 mg/dL — ABNORMAL HIGH (ref 70–99)

## 2021-05-08 LAB — PHOSPHORUS
Phosphorus: 4 mg/dL (ref 2.5–4.6)
Phosphorus: 4.8 mg/dL — ABNORMAL HIGH (ref 2.5–4.6)

## 2021-05-08 LAB — MAGNESIUM
Magnesium: 1.7 mg/dL (ref 1.7–2.4)
Magnesium: 1.7 mg/dL (ref 1.7–2.4)
Magnesium: 1.8 mg/dL (ref 1.7–2.4)

## 2021-05-08 MED ORDER — CLEVIDIPINE BUTYRATE 0.5 MG/ML IV EMUL
0.0000 mg/h | INTRAVENOUS | Status: DC
  Administered 2021-05-08: 5 mg/h via INTRAVENOUS
  Administered 2021-05-08: 6 mg/h via INTRAVENOUS
  Administered 2021-05-08: 1 mg/h via INTRAVENOUS
  Administered 2021-05-08 (×2): 4 mg/h via INTRAVENOUS
  Administered 2021-05-09: 3 mg/h via INTRAVENOUS
  Administered 2021-05-09: 2 mg/h via INTRAVENOUS
  Administered 2021-05-09: 5 mg/h via INTRAVENOUS
  Administered 2021-05-10: 3 mg/h via INTRAVENOUS
  Administered 2021-05-10 (×2): 5 mg/h via INTRAVENOUS
  Administered 2021-05-10: 3 mg/h via INTRAVENOUS
  Administered 2021-05-10: 6 mg/h via INTRAVENOUS
  Administered 2021-05-10: 8 mg/h via INTRAVENOUS
  Administered 2021-05-11 (×2): 12 mg/h via INTRAVENOUS
  Administered 2021-05-11: 16 mg/h via INTRAVENOUS
  Administered 2021-05-11: 8 mg/h via INTRAVENOUS
  Filled 2021-05-08 (×19): qty 50

## 2021-05-08 MED ORDER — ACETAMINOPHEN 325 MG PO TABS
650.0000 mg | ORAL_TABLET | ORAL | Status: DC | PRN
  Administered 2021-05-08 – 2021-05-16 (×6): 650 mg
  Filled 2021-05-08 (×6): qty 2

## 2021-05-08 MED ORDER — PROSOURCE TF PO LIQD
45.0000 mL | Freq: Two times a day (BID) | ORAL | Status: DC
  Administered 2021-05-08 – 2021-05-12 (×9): 45 mL
  Filled 2021-05-08 (×9): qty 45

## 2021-05-08 MED ORDER — VITAL HIGH PROTEIN PO LIQD
1000.0000 mL | ORAL | Status: DC
  Administered 2021-05-08: 1000 mL

## 2021-05-08 MED ORDER — LABETALOL HCL 5 MG/ML IV SOLN
10.0000 mg | Freq: Once | INTRAVENOUS | Status: AC
  Administered 2021-05-08: 10 mg via INTRAVENOUS
  Filled 2021-05-08: qty 4

## 2021-05-08 MED ORDER — CHLORHEXIDINE GLUCONATE CLOTH 2 % EX PADS
6.0000 | MEDICATED_PAD | Freq: Every day | CUTANEOUS | Status: DC
  Administered 2021-05-12 – 2021-05-16 (×5): 6 via TOPICAL

## 2021-05-08 MED ORDER — INSULIN ASPART 100 UNIT/ML IJ SOLN
0.0000 [IU] | INTRAMUSCULAR | Status: DC
  Administered 2021-05-08 – 2021-05-09 (×3): 1 [IU] via SUBCUTANEOUS
  Administered 2021-05-09: 2 [IU] via SUBCUTANEOUS
  Administered 2021-05-09: 1 [IU] via SUBCUTANEOUS
  Administered 2021-05-09: 2 [IU] via SUBCUTANEOUS
  Administered 2021-05-09 – 2021-05-10 (×3): 1 [IU] via SUBCUTANEOUS
  Administered 2021-05-10: 2 [IU] via SUBCUTANEOUS
  Administered 2021-05-10: 3 [IU] via SUBCUTANEOUS

## 2021-05-08 NOTE — Progress Notes (Signed)
Patient ID: KEIGHLEY DECKMAN, female   DOB: 1951/01/24, 71 y.o.   MRN: [id number] BP (!) 129/49    Pulse (!) 59    Temp 97.9 F (36.6 C)    Resp 12    Ht 5\' 2"  (1.575 m)    Wt 70 kg    SpO2 100%    BMI 28.23 kg/m  Comatose, not following commands at this time. By report she was possible following simple commands this morning.  Right pupil 67mm, minimally reactive.  Is not overbreathing the ventilator.  Certainly is better than yesterday. She however is still critical, and comatose.

## 2021-05-08 NOTE — Anesthesia Postprocedure Evaluation (Signed)
Anesthesia Post Note  Patient: Molly [name]  Procedure(s) Performed: CRANIOTOMY HEMATOMA EVACUATION SUBDURAL (Right: Head)     Patient location during evaluation: ICU Anesthesia Type: General Level of consciousness: sedated Pain management: pain level controlled Vital Signs Assessment: post-procedure vital signs reviewed and stable Respiratory status: patient remains intubated per anesthesia plan Cardiovascular status: stable Postop Assessment: no apparent nausea or vomiting Anesthetic complications: no   No notable events documented.  Last Vitals:  Vitals:   05/08/21 0645 05/08/21 0700  BP:  (!) 125/43  Pulse: (!) 58 (!) 58  Resp: 12 12  Temp: 36.6 C 36.6 C  SpO2: 100% 100%    Last Pain:  Vitals:   05/08/21 0400  TempSrc: Bladder  PainSc:                  Molly [name]

## 2021-05-08 NOTE — Progress Notes (Signed)
NAME:  Molly [name], MRN:  [id number], DOB:  03/19/1951, LOS: 1 ADMISSION DATE:  04/23/2021, CONSULTATION DATE:  05/03/2021 REFERRING MD:  Billy Fischer, CHIEF COMPLAINT:  loss of consciousness   History of Present Illness:  70yF with history of ESRD, PAD, chronic diastolic heart failure, OSA, on eliquis for ?AF per husband who was in her USOH until this morning fell and hit her head on a dresser. She felt ok enough to still go to dialysis but over the course of the day got more and more drowsy. Able to complete about an hour and a half of dialysis. Eventually became essentially unresponsive and husband called EMS.  In ED found to have traumatic SDH with midline shift, she was intubated and she was given 1g TXA, andexxanet.   Pertinent  Medical History  ESRD PAD Chronic diastolic heart failure OSA Eliquis for ?AF  Significant Hospital Events: Including procedures, antibiotic start and stop dates in addition to other pertinent events   05/08/2021 intubated, TXA, andexxanet, to OR for decompression/evacuation 1/22 underwent craniotomy with hematoma evacuation with Dr. Arneta Cliche overnight   Interim History / Subjective:  No major issues overnight   Objective   Blood pressure (!) 125/43, pulse (!) 58, temperature 97.9 F (36.6 C), resp. rate 12, height 5\' 2"  (1.575 m), weight 70 kg, SpO2 100 %.    Vent Mode: PRVC FiO2 (%):  [30 %-60 %] 30 % Set Rate:  [12 bmp-18 bmp] 12 bmp Vt Set:  [400 mL] 400 mL PEEP:  [5 cmH20] 5 cmH20 Plateau Pressure:  [16 cmH20-17 cmH20] 16 cmH20   Intake/Output Summary (Last 24 hours) at 05/08/2021 8101 Last data filed at 05/08/2021 0600 Gross per 24 hour  Intake 996.14 ml  Output 650 ml  Net 346.14 ml   Filed Weights   05/14/2021 1621  Weight: 70 kg    Examination: General: Acute on chronically ill appearing elderly female lying in bed on mechanical ventilation, in NAD HEENT: ETT, MM pink/moist, PERRL,  Neuro: Flicker of movement when painful stimuli applied  to left side only, cough with suction, pupils pinpoint bilaterally    CV: s1s2 regular rate and rhythm, no murmur, rubs, or gallops,  PULM:  Clear to ascultation, no increased work of breathing, no added breath sounds  GI: soft, bowel sounds active in all 4 quadrants, non-tender, non-distended, tolerating TF Extremities: warm/dry, no edema  Skin: no rashes or lesions  Resolved Hospital Problem list     Assessment & Plan:   SDH post mechanical fall  TBI -S/p andexxanet in ED P: Management per NSGY  Maintain neuro protective measures; goal for eurothermia, euglycemia, eunatermia, normoxia, and PCO2 goal of 35-40 Nutrition and bowel regiment  Seizure precautions  AEDs per NSGY Aspirations precautions  Frequent neurochecks  Acute respiratory failure with hypoxia: -Secondary to above P: Continue ventilator support with lung protective strategies  Wean PEEP and FiO2 for sats greater than 90%. Head of bed elevated 30 degrees. Plateau pressures less than 30 cm H20.  Follow intermittent chest x-ray and ABG.   SAT/SBT as tolerated, mentation preclude extubation  Ensure adequate pulmonary hygiene  Follow cultures  VAP bundle in place  PAD protocol  Hypertensive emergency -Blood pressure was 200/90 P: Continue Cleviprix SBP goal less than 160 or per NSGY   ESRD on HD T/TH/SA -Patient went to HD session was day of admisison 1/21  P: Nephrology consulted  No acute indications for HD at this time  Follow renal function  Trend Bmet  HX of PAD  ? Hx of A-fib Anticoagulated with Eliquis  P: Home Eliquis on hold  Continuous telemetry    Best Practice (right click and "Reselect all SmartList Selections" daily)   Diet/type: NPO w/ meds via tube DVT prophylaxis: not indicated GI prophylaxis: N/A Lines: N/A Foley:  N/A Code Status:  full code Last date of multidisciplinary goals of care discussion Update family daily    Critical care time:   CRITICAL CARE Performed  by: Samhitha Rosen D. Harris  Total critical care time: 38 minutes  Critical care time was exclusive of separately billable procedures and treating other patients.  Critical care was necessary to treat or prevent imminent or life-threatening deterioration.  Critical care was time spent personally by me on the following activities: development of treatment plan with patient and/or surrogate as well as nursing, discussions with consultants, evaluation of patient's response to treatment, examination of patient, obtaining history from patient or surrogate, ordering and performing treatments and interventions, ordering and review of laboratory studies, ordering and review of radiographic studies, pulse oximetry and re-evaluation of patient's condition.  Antia Rahal D. Kenton Kingfisher, NP-C [facility] Personal contact information can be found on Amion  05/08/2021, 8:00 AM

## 2021-05-08 NOTE — TOC Progression Note (Signed)
Transition of Care [facility]) - Progression Note    Patient Details  Name: SHAVONTE ZHAO MRN: [id number] Date of Birth: 1950/05/20  Transition of Care [facility]) CM/SW Contact  Zenon Mayo, RN Phone Number: 05/08/2021, 1:22 PM  Clinical Narrative:     Transition of Care [facility]) Screening Note   Patient Details  Name: CHERISH RUNDE Date of Birth: December 20, 1950   Transition of Care [facility]) CM/SW Contact:    Zenon Mayo, RN Phone Number: 05/08/2021, 1:22 PM    Transition of Care Department [facility]) has reviewed patient and no TOC needs have been identified at this time. We will continue to monitor patient advancement through interdisciplinary progression rounds. If new patient transition needs arise, please place a TOC consult.          Expected Discharge Plan and Services                                                 Social Determinants of Health (SDOH) Interventions    Readmission Risk Interventions No flowsheet data found.

## 2021-05-08 NOTE — Consult Note (Signed)
[place] Nurse Consult Note: Reason for Consult: Left lateral heel healing full thickness wound, left great toe lateral full thickness wound in patient with known PAD Wound type: previous arterial insufficiency Pressure Injury POA:N/A Measurement: Lateral heel:  1.5cm x 1cm x 0.2cm with pink, moist wound bed, small serous exudate Lateral toe:  1cm round Dry callus Wound bed:As described above Drainage (amount, consistency, odor) As described above Periwound: intact, dry Dressing procedure/placement/frequency: I will continue the POC in place by Podiatry (Dr. Posey Pronto) and executed daily by her husband which has contributed to excellent wound healing. We will use a silver dressing to the heel and place foot into Prevalon boots to both offload pressure, and correct lateral rotation of the foot.  The callus will be kept dry and infection free using a twice daily application of a betadine swabstick.  A silicone foam dressing is to be placed at the sacrum, which is currently intact, but at risk for pressure injury.  [place] nursing team will not follow, but will remain available to this patient, the nursing and medical teams.  Please re-consult if needed. Thanks, Maudie Flakes, MSN, RN, [place], Arther Abbott  Pager# [phone]

## 2021-05-08 NOTE — Consult Note (Signed)
Renal Service Consult Note [facility]  PEGGE CUMBERLEDGE 05/08/2021 Sol Blazing, MD Requesting Physician: Dr. Christella Noa  Reason for Consult: ESRD pt w/ fall and SDH HPI: The patient is a 71 y.o. year-old w/ hx of DM2, HTN, ESRD on HD, IBS, PAD who fell striking her head on her nightstand in the morning pre HD. She went to dialysis then came home. Slowly at home her mental status began to deteriorate. Husband called 97 and pt taken to hospital. In ED R pupil was enlarged, head CT showed large SDH.  Pt was taken last night by neurosurgery for craniotomy w/ SDH evacuation.  Pt is now in ICU on vent and sedated. Asked to see for ESRD.    Pt seen in ICU.  Pt is not responsive, but husband at bedside said she has responded minimally to simple commands to "wiggle your toes", or "squeeze my hand" that he has made.    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 No pertinent family history. Social History  reports that she has never smoked. She has never used smokeless tobacco. She reports that she does not currently use alcohol. No history on file for drug use. Allergies  Allergies  Allergen Reactions   Mercury Anaphylaxis   Home medications Prior to Admission medications   Medication Sig Start Date End Date Taking? Authorizing Provider  acetaminophen (TYLENOL) 325 MG tablet Take 650 mg by mouth every 4 (four) hours as needed for headache.   Yes [provider]  apixaban (ELIQUIS) 5 MG TABS tablet Take 5 mg by mouth 2 (two) times daily.   Yes [provider]  carboxymethylcellul-glycerin (OPTIVE) 0.5-0.9 % ophthalmic solution Place 1  drop into both eyes 3 (three) times daily as needed for dry eyes.   Yes [provider]  carvedilol (COREG) 25 MG tablet Take 25 mg by mouth 2 (two) times daily. 03/09/21  Yes [provider]  cilostazol (PLETAL) 50 MG tablet Take 50 mg by mouth 2 (two) times daily. 04/22/21  Yes [provider]  ezetimibe (ZETIA) 10 MG tablet Take 10 mg by mouth every morning. 01/31/21  Yes [provider]  famotidine (PEPCID) 20 MG tablet Take 20 mg by mouth 2 (two) times daily as needed for heartburn or indigestion.   Yes [provider]  FLUoxetine (PROZAC) 10 MG capsule Take 10 mg by mouth every morning. 04/22/21  Yes [provider]  hydrALAZINE (APRESOLINE) 25 MG tablet Take 25 mg by mouth every 12 (twelve) hours. 02/08/21  Yes [provider]  hydrocortisone cream 1 % Apply 1 application topically 4 (four) times daily as needed for itching.   Yes [provider]  isosorbide mononitrate (IMDUR) 60 MG 24 hr tablet Take 60 mg by mouth every morning. 03/09/21  Yes [provider]  ketoconazole (NIZORAL) 2 % cream Apply 1 application topically daily as needed for irritation.   Yes [provider]  levocetirizine (XYZAL) 5 MG tablet Take 5 mg by mouth daily as needed for allergies. 12/01/20  Yes [provider]  loperamide (IMODIUM)  1 MG/5ML solution Take 2 mg by mouth as needed for diarrhea or loose stools.   Yes [provider]  metoprolol succinate (TOPROL-XL) 50 MG 24 hr tablet Take 50 mg by mouth every morning. 01/17/21  Yes [provider]  rosuvastatin (CRESTOR) 10 MG tablet Take 10 mg by mouth every morning. 04/22/21  Yes [provider]  aspirin EC 81 MG tablet Take 81 mg by mouth daily at 6 (six) AM. Swallow whole. Patient not taking: Reported on 05/08/2021    [provider]     Vitals:   05/08/21 1655 05/08/21 1700 05/08/21 1730 05/08/21 1800  BP:  (!) 134/55  (!) 134/47   Pulse: 74 62 73 (!) 59  Resp: 18 14 12 16   Temp: 98.6 F (37 C) 98.6 F (37 C) 98.8 F (37.1 C) 98.8 F (37.1 C)  TempSrc:      SpO2: 100% 100% 100% 100%  Weight:      Height:       Exam Gen on vent, sedated, NG tube in place +facial bruising R > L No rash, cyanosis or gangrene Sclera anicteric, throat w/ ETT No jvd or bruits Chest clear anterior/ lateral RRR no MRG Abd soft ntnd no mass or ascites +bs GU w/ foley in place MS no joint effusions or deformity Ext trace bilat UE edema, no LE edema, no wounds or ulcers Neuro is on vent, sedated LUA aVF +bruit/ TDC    Home meds include - eliquis 5 bid, coreg 25 bid, pletal, zetia, pepcid, prozac, hydralazine 25 bid, imdur 60, toprol xl 50 am, crestor, asa 81, prns/ vits/ supps    CXR - bilat IS changes c/w edema    OP HD: from Dec 2022 needs updating >> [facility]   4h  400/1.5  (59.8kg)  3K/2.25 bath  TDC/ LUA AVF maturing  Hep NONE (was getting 2000 pre admit)   Assessment/ Plan: Acute traumatic SDH - w/ altered MS. Taken for SDH evacuation 1/21 last night.  VDRF - on vent, CCM following Abnormal CXR - read as edema. Wt's up, possible vol excess.  ESRD - on HD TTS. Last HD was yesterday as an outpt, had 2/3 of her session. Labs/ vol are good. Plan extra HD monday off schedule for suspected vol overload.  HTN - on Cleviprex gtt, BP's well controlled. Is on 3 BP meds at home, not getting here.  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   Recent Labs  Lab 05/04/2021 1616 04/20/2021 2201 05/08/21 0537 05/08/21 0842 05/08/21 1609 05/08/21 1627  K 4.1   < >  3.4*  --  3.4*  --   BUN 14  --  18  --   --   --   CREATININE 1.60*  --  2.27*  --   --   --   CALCIUM  --   --  8.1*  --   --   --   PHOS  --   --   --  4.0  --  4.8*   < > = values in this interval not displayed.

## 2021-05-08 NOTE — Anesthesia Postprocedure Evaluation (Signed)
Anesthesia Post Note  Patient: Molly [name]  Procedure(s) Performed: CRANIOTOMY HEMATOMA EVACUATION SUBDURAL (Right: Head)     Patient location during evaluation: ICU Anesthesia Type: General Level of consciousness: sedated Pain management: pain level controlled Vital Signs Assessment: post-procedure vital signs reviewed and stable Respiratory status: patient remains intubated per anesthesia plan Cardiovascular status: stable Postop Assessment: no apparent nausea or vomiting Anesthetic complications: no   No notable events documented.  Last Vitals:  Vitals:   05/08/21 0645 05/08/21 0700  BP:  (!) 125/43  Pulse: (!) 58 (!) 58  Resp: 12 12  Temp: 36.6 C 36.6 C  SpO2: 100% 100%    Last Pain:  Vitals:   05/08/21 0400  TempSrc: Bladder  PainSc:                  Karyl Kinnier Nilay Mangrum

## 2021-05-09 ENCOUNTER — Inpatient Hospital Stay (HOSPITAL_COMMUNITY): Payer: Medicare Other

## 2021-05-09 ENCOUNTER — Encounter (HOSPITAL_COMMUNITY): Payer: Self-pay | Admitting: Neurosurgery

## 2021-05-09 DIAGNOSIS — J9601 Acute respiratory failure with hypoxia: Secondary | ICD-10-CM | POA: Diagnosis present

## 2021-05-09 DIAGNOSIS — I1311 Hypertensive heart and chronic kidney disease without heart failure, with stage 5 chronic kidney disease, or end stage renal disease: Secondary | ICD-10-CM | POA: Diagnosis present

## 2021-05-09 DIAGNOSIS — S065XAA Traumatic subdural hemorrhage with loss of consciousness status unknown, initial encounter: Secondary | ICD-10-CM | POA: Diagnosis not present

## 2021-05-09 LAB — MAGNESIUM
Magnesium: 1.7 mg/dL (ref 1.7–2.4)
Magnesium: 2.2 mg/dL (ref 1.7–2.4)

## 2021-05-09 LAB — GLUCOSE, CAPILLARY
Glucose-Capillary: 156 mg/dL — ABNORMAL HIGH (ref 70–99)
Glucose-Capillary: 224 mg/dL — ABNORMAL HIGH (ref 70–99)
Glucose-Capillary: 186 mg/dL — ABNORMAL HIGH (ref 70–99)
Glucose-Capillary: 162 mg/dL — ABNORMAL HIGH (ref 70–99)
Glucose-Capillary: 200 mg/dL — ABNORMAL HIGH (ref 70–99)
Glucose-Capillary: 202 mg/dL — ABNORMAL HIGH (ref 70–99)

## 2021-05-09 LAB — BASIC METABOLIC PANEL
Anion gap: 13 (ref 5–15)
BUN: 28 mg/dL — ABNORMAL HIGH (ref 8–23)
CO2: 24 mmol/L (ref 22–32)
Calcium: 8.1 mg/dL — ABNORMAL LOW (ref 8.9–10.3)
Chloride: 98 mmol/L (ref 98–111)
Creatinine, Ser: 3.6 mg/dL — ABNORMAL HIGH (ref 0.44–1.00)
GFR, Estimated: 13 mL/min — ABNORMAL LOW (ref >60)
Glucose, Bld: 210 mg/dL — ABNORMAL HIGH (ref 70–99)
Potassium: 3.3 mmol/L — ABNORMAL LOW (ref 3.5–5.1)
Sodium: 135 mmol/L (ref 135–145)

## 2021-05-09 LAB — HEMOGLOBIN A1C
Hgb A1c MFr Bld: 5.9 % — ABNORMAL HIGH (ref 4.8–5.6)
Mean Plasma Glucose: 123 mg/dL

## 2021-05-09 LAB — PHOSPHORUS
Phosphorus: 4.4 mg/dL (ref 2.5–4.6)
Phosphorus: 4.2 mg/dL (ref 2.5–4.6)

## 2021-05-09 LAB — HEPATITIS B SURFACE ANTIGEN: Hepatitis B Surface Ag: NONREACTIVE

## 2021-05-09 LAB — HEPATITIS B SURFACE ANTIBODY,QUALITATIVE: Hep B S Ab: REACTIVE — AB

## 2021-05-09 MED ORDER — VITAL 1.5 CAL PO LIQD
1000.0000 mL | ORAL | Status: DC
  Administered 2021-05-09 – 2021-05-12 (×3): 1000 mL

## 2021-05-09 MED ORDER — MAGNESIUM SULFATE IN D5W 1-5 GM/100ML-% IV SOLN
1.0000 g | Freq: Once | INTRAVENOUS | Status: AC
  Administered 2021-05-09: 1 g via INTRAVENOUS
  Filled 2021-05-09: qty 100

## 2021-05-09 MED ORDER — PANTOPRAZOLE 2 MG/ML SUSPENSION
40.0000 mg | Freq: Every day | ORAL | Status: DC
  Administered 2021-05-09 – 2021-05-15 (×7): 40 mg
  Filled 2021-05-09 (×7): qty 20

## 2021-05-09 MED ORDER — LEVETIRACETAM 100 MG/ML PO SOLN
500.0000 mg | Freq: Every day | ORAL | Status: DC
  Administered 2021-05-10 – 2021-05-12 (×3): 500 mg
  Filled 2021-05-09 (×3): qty 5

## 2021-05-09 MED ORDER — POTASSIUM CHLORIDE 20 MEQ PO PACK
20.0000 meq | PACK | Freq: Once | ORAL | Status: AC
  Administered 2021-05-09: 20 meq
  Filled 2021-05-09: qty 1

## 2021-05-09 MED ORDER — LEVETIRACETAM 100 MG/ML PO SOLN
500.0000 mg | ORAL | Status: DC
  Administered 2021-05-10: 500 mg
  Filled 2021-05-09: qty 5

## 2021-05-09 MED ORDER — LEVETIRACETAM 100 MG/ML PO SOLN: 500.0000 mg | Freq: Every day | ORAL | Status: DC

## 2021-05-09 MED ORDER — CARVEDILOL 12.5 MG PO TABS
25.0000 mg | ORAL_TABLET | Freq: Two times a day (BID) | ORAL | Status: DC
  Administered 2021-05-09 – 2021-05-11 (×4): 25 mg
  Filled 2021-05-09 (×4): qty 2

## 2021-05-09 NOTE — Progress Notes (Addendum)
PHARMACY NOTE:   RENAL DOSAGE ADJUSTMENT  Current antimicrobial regimen includes a mismatch between antimicrobial dosage and estimated renal function.  As per policy approved by the Pharmacy & Therapeutics and Medical Executive Committees, the antimicrobial dosage will be adjusted accordingly.  Current dosage:  Keppra 500mg  IV Q12H  Indication: seizure prophylaxis  Renal Function:  Estimated Creatinine Clearance: 21.8 mL/min (A) (by C-G formula based on SCr of 2.27 mg/dL (H)). [x]      On intermittent HD, scheduled:  TTS with extra session on 1/23 []      On CRRT    Dosage has been changed to:  Keppra 500mg  PT daily with 500mg  PT after each HD sessions  Thank you for allowing pharmacy to be a part of this patient's care.  Quince Santana D. Mina Marble, PharmD, BCPS, [place] 05/09/2021, 9:13 AM

## 2021-05-09 NOTE — Progress Notes (Signed)
Patient ID: Molly [name], female   DOB: [birth year], 71 y.o.   MRN: [id number] BP (!) 154/53    Pulse 62    Temp 99.1 F (37.3 C)    Resp 14    Ht 5\' 2"  (1.575 m)    Wt 70 kg    SpO2 99%    BMI 28.23 kg/m  Winces with painful stimuli Moves head away from stimulus Not localizing Appeared to try and open eyes Seems to be improving. Will order CT for tomorrow, head.

## 2021-05-09 NOTE — TOC CAGE-AID Note (Signed)
Transition of Care [facility]) - CAGE-AID Screening   Patient Details  Name: Molly [name] MRN: [id number] Date of Birth: 1950-04-27  Transition of [facility]) CM/SW Contact:    Quashaun Lazalde C Tarpley-Carter, [place] Phone Number: 05/09/2021, 10:39 AM   Clinical Narrative: Pt is unable to participate in Cage Aid.  Virginia Curl Tarpley-Carter, MSW, LCSW-A Pronouns:  She/Her/Hers [place] Transitions of Care Clinical Social Worker Direct Number:  [phone] [email or web address]  CAGE-AID Screening: Substance Abuse Screening unable to be completed due to: : Patient unable to participate             Substance Abuse Education Offered: No

## 2021-05-09 NOTE — Progress Notes (Signed)
NAME:  Molly [name], MRN:  [id number], DOB:  [birth year], LOS: 2 ADMISSION DATE:  05/06/2021, CONSULTATION DATE:  04/28/2021 REFERRING MD:  Billy Fischer, CHIEF COMPLAINT:  loss of consciousness   History of Present Illness:  70yF with history of ESRD, PAD, chronic diastolic heart failure, OSA, on eliquis for ?AF per husband who was in her USOH until this morning fell and hit her head on a dresser. She felt ok enough to still go to dialysis but over the course of the day got more and more drowsy. Able to complete about an hour and a half of dialysis. Eventually became essentially unresponsive and husband called EMS.  In ED found to have traumatic SDH with midline shift, she was intubated and she was given 1g TXA, andexxanet.   Pertinent  Medical History  ESRD PAD Chronic diastolic heart failure OSA Eliquis for ?AF  Significant Hospital Events: Including procedures, antibiotic start and stop dates in addition to other pertinent events   04/20/2021 intubated, TXA, andexxanet, to OR for decompression/evacuation of right acute subdural hematoma.  1/22 underwent craniotomy with hematoma evacuation with Dr. Arneta Cliche overnight   Interim History / Subjective:  No major issues overnight   Objective   Blood pressure (!) 154/52, pulse 63, temperature 99.1 F (37.3 C), resp. rate 14, height 5\' 2"  (1.575 m), weight 70 kg, SpO2 100 %.    Vent Mode: PRVC FiO2 (%):  [30 %] 30 % Set Rate:  [12 bmp] 12 bmp Vt Set:  [400 mL] 400 mL PEEP:  [5 cmH20] 5 cmH20 Plateau Pressure:  [16 cmH20-17 cmH20] 16 cmH20   Intake/Output Summary (Last 24 hours) at 05/09/2021 0900 Last data filed at 05/09/2021 0800 Gross per 24 hour  Intake 1217.5 ml  Output 27 ml  Net 1190.5 ml    Filed Weights   04/22/2021 1621  Weight: 70 kg    Examination: General: Acute on chronically ill appearing elderly female lying in bed on mechanical ventilation, in NAD HEENT: ETT, MM pink/moist, PERRL, OGT in place  Neuro: remains  stuporous. Will spontaneously move both sides. Did not follow command for me. Now off all sedation.   CV: s1s2 regular rate and rhythm, no murmur, rubs, or gallops,  PULM:  Clear to ascultation, no increased work of breathing, no added breath sounds  GI: soft, bowel sounds active in all 4 quadrants, non-tender, non-distended, tolerating TF Extremities: warm/dry, no edema  Skin: no rashes or lesions  Ancillary tests personally reviewed:   CT head preop showed mixed density SDH on right with midline shift.  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 May take a few days for shift to resolve and level of consciousness to improve and allow extubation.  - Continue to titrate Cleviprex to keep SBP 110-160. Will restart home carvedilol.   Best Practice (right click and "Reselect all SmartList Selections" daily)   Diet/type: NPO w/ meds via tube will have Cortrak placed as anticipate extended need for nutritional support.  DVT prophylaxis: not indicated GI prophylaxis: N/A Lines: N/A Foley:  N/A Code Status:  full code Last date of multidisciplinary goals of care discussion: husband updated at bedside 1/23.   Critical care time:   CRITICAL CARE Performed by: Kipp Brood  Total critical care time: 43minutes  Critical care time was exclusive of separately billable procedures and treating other patients.  Critical care was necessary to treat or prevent imminent or life-threatening deterioration.  Critical care was time spent personally by me on the following activities: development of treatment plan with patient and/or surrogate as well as nursing, discussions with consultants, evaluation of patient's response to  treatment, examination of patient, obtaining history from patient or surrogate, ordering and performing treatments and interventions, ordering and review of laboratory studies, ordering and review of radiographic studies, pulse oximetry and re-evaluation of patient's condition.  Kipp Brood, MD [facility] ICU Physician [place]  Pager: [phone] Or Epic Secure Chat After hours: [phone].  05/09/2021, 9:20 AM

## 2021-05-09 NOTE — Progress Notes (Signed)
Pt ET tube moved from 24 measured at the lip to 22 measured at the lip per MD. Pt tolerated well, Chest xray ordered to confirm, MD aware,  RN aware, RT will continue to monitor.

## 2021-05-09 NOTE — Progress Notes (Addendum)
[facility] Progress Note  Summary: 71 y.o. year-old w/ hx of DM2, HTN, ESRD on HD, IBS, PAD who fell striking her head on her nightstand in the morning pre HD. She went to dialysis then came home. Slowly at home her mental status began to deteriorate. Husband called 87 and pt taken to hospital. In ED R pupil was enlarged, head CT showed large SDH.  Pt was taken to OR by neurosurgery for craniotomy w/ SDH evacuation.   Subjective: pt seen in ICU. Repeat CXR shows clearing of edema.   Vitals:   05/09/21 0000 05/09/21 0008 05/09/21 0030 05/09/21 0329  BP: (!) 135/50 (!) 145/48  (!) 135/49  Pulse: 64 63 65 65  Resp: _0 Temp: 99.5 F (37.5 C) 99.5 F (37.5 C) 99.3 F (37.4 C) 99.3 F (37.4 C)  TempSrc: Bladder     SpO2: 100% 100% 100% 100%  Weight:      Height:        Exam: Gen on vent, sedated, NG tube in place +facial bruising R > L Sclera anicteric, throat w/ ETT No jvd Chest clear anterior/ lateral RRR no MRG Abd soft ntnd no mass or ascites +bs GU w/ foley in place Ext trace UE edema, no LE edema Neuro is on vent, sedated LUA AVF +bruit/ +TDC     Home meds include - eliquis 5 bid, coreg 25 bid, pletal, zetia, pepcid, prozac, hydralazine 25 bid, imdur 60, toprol xl 50 am, crestor, asa 81, prns/ vits/ supps     CXR - bilat IS changes c/w edema    CXR 1/23- no acute    OP HD: [place] TTS  4h  58.3kg  2/2.25 bath TDC using/ LUA AVF (needs revision still?)  Heparin NONE (was getting 2000 as OP)  - hectorol 1ug tiw     Assessment/ Plan: Acute traumatic SDH - w/ altered MS. SP craniotomy / SDH evacuation 1/21 by neurosurgery. Apparently purposeful movement today.  VDRF - on vent, per CCM Abnormal CXR - initial CXR showed pulm edema. Repeat film today is normal, edema resolved. Suspect it was neurogenic.  ESRD - on HD TTS. Last HD was OP HD on 1/21. Does not need HD today. HD tomorrow on schedule.  HTN /volume- on Cleviprex gtt, BP's well  controlled. Holding 3 home BP lowering meds. Wt's are off. No edema on exam. CXR clear 1/23.  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   Inpatient medications:  chlorhexidine  gluconate (MEDLINE KIT)  15 mL Mouth Rinse BID   Chlorhexidine Gluconate Cloth  6 each Topical Daily   Chlorhexidine Gluconate Cloth  6 each Topical Q0600   feeding supplement (PROSource TF)  45 mL Per Tube BID   feeding supplement (VITAL HIGH PROTEIN)  1,000 mL Per Tube Q24H   insulin aspart  0-6 Units Subcutaneous Q4H   mouth rinse  15 mL Mouth Rinse 10 times per day   pantoprazole (PROTONIX) IV  40 mg Intravenous QHS    clevidipine 5 mg/hr (05/09/21 0100)   levETIRAcetam Stopped (05/08/21 2149)   propofol (DIPRIVAN) infusion Stopped (05/16/2021 1851)   tranexamic acid (CYKLOKAPRON) infusion (TRAUMA)     acetaminophen, HYDROcodone-acetaminophen, labetalol, morphine injection, naLOXone (NARCAN)  injection, ondansetron **OR** ondansetron (ZOFRAN) IV

## 2021-05-09 NOTE — Progress Notes (Signed)
Initial Nutrition Assessment  DOCUMENTATION CODES:   Not applicable  INTERVENTION:   Initiate tube feeding via Cortrak: Vital 1.5 at 45 ml/h (1080 ml per day) Prosource TF 45 ml BID  Provides 1700 kcal, 94 gm protein, 820 ml free water daily   NUTRITION DIAGNOSIS:   Increased nutrient needs related to  (trauma) as evidenced by estimated needs.  GOAL:   Patient will meet greater than or equal to 90% of their needs  MONITOR:   TF tolerance  REASON FOR ASSESSMENT:   Consult, Ventilator Enteral/tube feeding initiation and management  ASSESSMENT:   Pt with PMH of IBS, ESRD, PAD, s/p LLE revasc surgery with I&D of L foot wound 12/22, CHF, OSA who was admitted 1/21 from HD after becoming unresponsive after fall and hitting her head that morning.    Pt discussed during ICU rounds and with RN.  Per renal EDW: 59.8 kg, per Renal ok to hold off on iHD until tomorrow  Current weight likely estimated.   Spoke with pt's husband who is at bedside. He reports that pt eats very little mostly due to chronic diarrhea which she has had for last 5 years after having her gallbladder removed. She mostly eats chicken/fish and vegetables. She does not like protein bars or Nepro and these cause diarrhea. He reports no weight changes.  Breakfast: none Lunch: 1 piece of fish with vegetables or 1/2 piece of chicken and vegetables Dinner: same (other 1/2 of her plate)   Per husband pt has been in a wheelchair for the last 6 months after hip fx. NFPE demonstrates muscle depletion of BLE. Husband states that revasc procedure has helped heel and toe wounds.    12/21 s/p crani for evacuation or R acute SDH 1/23 s/p cortrak tube; gastric placement   Patient is currently intubated on ventilator support MV: 6.6 L/min Temp (24hrs), Avg:99.1 F (37.3 C), Min:98.4 F (36.9 C), Max:99.5 F (37.5 C)   Cleviprex @ 4 ml/hr provides: 192 kcal  Medications reviewed and include: SSI,  protonix Keppra  Labs reviewed: K: 3.4 CBG's: 156-224   UOP: 37 ml 18 F OG tube: coiled in proximal stomach   NUTRITION - FOCUSED PHYSICAL EXAM:  Flowsheet Row Most Recent Value  Orbital Region Unable to assess  [edema]  Upper Arm Region Mild depletion  Thoracic and Lumbar Region No depletion  Buccal Region Unable to assess  [ETT holder]  Temple Region Unable to assess  [edema]  Clavicle Bone Region No depletion  Clavicle and Acromion Bone Region Mild depletion  Scapular Bone Region Moderate depletion  Dorsal Hand Mild depletion  Patellar Region Severe depletion  Anterior Thigh Region Severe depletion  Posterior Calf Region Unable to assess  [boots]  Edema (RD Assessment) Moderate  [facial]  Hair Reviewed  Eyes Unable to assess  Mouth Unable to assess  Skin Reviewed  Nails Reviewed       Diet Order:   Diet Order             Diet NPO time specified  Diet effective now                   EDUCATION NEEDS:   Not appropriate for education at this time  Skin:  Skin Assessment: Skin Integrity Issues: Skin Integrity Issues:: Diabetic Ulcer 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  Estimated Nutritional Needs:   Kcal:  1600-1800  Protein:  85-100 grams  Fluid:  1.2 L/day  Lockie Pares., RD, LDN, CNSC See AMiON for contact information

## 2021-05-09 NOTE — Procedures (Signed)
Cortrak  Person Inserting Tube:  Molly [name], RD Tube Type:  Cortrak - 43 inches Tube Size:  10 Tube Location:  Left nare Initial Placement:  Stomach Secured by: Bridle Technique Used to Measure Tube Placement:  Marking at nare/corner of mouth Cortrak Secured At:  61 cm  Cortrak Tube Team Note:  Consult received to place a Cortrak feeding tube.   X-ray is required, abdominal x-ray has been ordered by the Cortrak team. Please confirm tube placement before using the Cortrak tube.   If the tube becomes dislodged please keep the tube and contact the Cortrak team at [email or web address] ([email or web address]) for replacement.  If after hours and replacement cannot be delayed, place a NG tube and confirm placement with an abdominal x-ray.   Kerman Passey MS, RDN, LDN, CNSC Registered Dietitian III Clinical Nutrition RD Pager and On-Call Pager Number Located in [place]

## 2021-05-10 ENCOUNTER — Inpatient Hospital Stay (HOSPITAL_COMMUNITY): Payer: Medicare Other

## 2021-05-10 DIAGNOSIS — S065XAA Traumatic subdural hemorrhage with loss of consciousness status unknown, initial encounter: Secondary | ICD-10-CM | POA: Diagnosis not present

## 2021-05-10 LAB — BASIC METABOLIC PANEL
Anion gap: 10 (ref 5–15)
BUN: 37 mg/dL — ABNORMAL HIGH (ref 8–23)
CO2: 23 mmol/L (ref 22–32)
Calcium: 7.5 mg/dL — ABNORMAL LOW (ref 8.9–10.3)
Chloride: 101 mmol/L (ref 98–111)
Creatinine, Ser: 4.3 mg/dL — ABNORMAL HIGH (ref 0.44–1.00)
GFR, Estimated: 11 mL/min — ABNORMAL LOW (ref >60)
Glucose, Bld: 155 mg/dL — ABNORMAL HIGH (ref 70–99)
Potassium: 3.2 mmol/L — ABNORMAL LOW (ref 3.5–5.1)
Sodium: 134 mmol/L — ABNORMAL LOW (ref 135–145)

## 2021-05-10 LAB — GLUCOSE, CAPILLARY
Glucose-Capillary: 249 mg/dL — ABNORMAL HIGH (ref 70–99)
Glucose-Capillary: 176 mg/dL — ABNORMAL HIGH (ref 70–99)
Glucose-Capillary: 300 mg/dL — ABNORMAL HIGH (ref 70–99)
Glucose-Capillary: 167 mg/dL — ABNORMAL HIGH (ref 70–99)
Glucose-Capillary: 283 mg/dL — ABNORMAL HIGH (ref 70–99)
Glucose-Capillary: 176 mg/dL — ABNORMAL HIGH (ref 70–99)

## 2021-05-10 LAB — HEPATITIS B SURFACE ANTIBODY, QUANTITATIVE: Hep B S AB Quant (Post): 122.2 m[IU]/mL (ref >9.9)

## 2021-05-10 MED ORDER — AMANTADINE HCL 50 MG/5ML PO SOLN
100.0000 mg | Freq: Two times a day (BID) | ORAL | Status: DC
  Filled 2021-05-10: qty 10

## 2021-05-10 MED ORDER — AMLODIPINE BESYLATE 10 MG PO TABS
10.0000 mg | ORAL_TABLET | Freq: Every day | ORAL | Status: DC
  Administered 2021-05-11: 10 mg
  Filled 2021-05-10: qty 1

## 2021-05-10 MED ORDER — HYDRALAZINE HCL 10 MG PO TABS
10.0000 mg | ORAL_TABLET | Freq: Three times a day (TID) | ORAL | Status: DC
  Administered 2021-05-10 – 2021-05-11 (×2): 10 mg
  Filled 2021-05-10 (×6): qty 1

## 2021-05-10 MED ORDER — INSULIN ASPART 100 UNIT/ML IJ SOLN
2.0000 [IU] | INTRAMUSCULAR | Status: DC
  Administered 2021-05-10 – 2021-05-13 (×13): 2 [IU] via SUBCUTANEOUS

## 2021-05-10 MED ORDER — AMANTADINE HCL 50 MG/5ML PO SOLN
100.0000 mg | Freq: Two times a day (BID) | ORAL | Status: DC
  Administered 2021-05-10 – 2021-05-11 (×3): 100 mg
  Filled 2021-05-10 (×4): qty 10

## 2021-05-10 MED ORDER — HEPARIN SODIUM (PORCINE) 1000 UNIT/ML IJ SOLN
INTRAMUSCULAR | Status: AC
  Administered 2021-05-10: 1000 [IU]
  Filled 2021-05-10: qty 4

## 2021-05-10 MED ORDER — INSULIN ASPART 100 UNIT/ML IJ SOLN
0.0000 [IU] | INTRAMUSCULAR | Status: DC
  Administered 2021-05-10: 3 [IU] via SUBCUTANEOUS
  Administered 2021-05-10: 8 [IU] via SUBCUTANEOUS
  Administered 2021-05-10 – 2021-05-11 (×5): 3 [IU] via SUBCUTANEOUS
  Administered 2021-05-12: 5 [IU] via SUBCUTANEOUS
  Administered 2021-05-12: 8 [IU] via SUBCUTANEOUS
  Administered 2021-05-12 (×2): 5 [IU] via SUBCUTANEOUS
  Administered 2021-05-13: 3 [IU] via SUBCUTANEOUS
  Administered 2021-05-13: 2 [IU] via SUBCUTANEOUS
  Administered 2021-05-14 (×4): 3 [IU] via SUBCUTANEOUS
  Administered 2021-05-14 (×2): 2 [IU] via SUBCUTANEOUS
  Administered 2021-05-15: 5 [IU] via SUBCUTANEOUS
  Administered 2021-05-15: 3 [IU] via SUBCUTANEOUS
  Administered 2021-05-15 (×2): 8 [IU] via SUBCUTANEOUS
  Administered 2021-05-16: 2 [IU] via SUBCUTANEOUS
  Administered 2021-05-16: 11 [IU] via SUBCUTANEOUS
  Administered 2021-05-16 (×2): 5 [IU] via SUBCUTANEOUS

## 2021-05-10 MED ORDER — POTASSIUM CHLORIDE 20 MEQ PO PACK
20.0000 meq | PACK | Freq: Once | ORAL | Status: AC
Start: 2021-05-10 — End: 2021-05-10
  Administered 2021-05-10: 20 meq
  Filled 2021-05-10: qty 1

## 2021-05-10 NOTE — Progress Notes (Signed)
[facility] Progress Note  Summary: 71 y.o. year-old w/ hx of DM2, HTN, ESRD on HD, IBS, PAD who fell striking her head on her nightstand in the morning pre HD. She went to dialysis then came home. Slowly at home her mental status began to deteriorate. Husband called 75 and pt taken to hospital. In ED R pupil was enlarged, head CT showed large SDH.  Pt was taken to OR by neurosurgery for craniotomy w/ SDH evacuation.   Subjective: pt seen in ICU  Vitals:   05/10/21 1145 05/10/21 1200 05/10/21 1215 05/10/21 1230  BP:  (!) 138/48    Pulse: 68 64 65 67  Resp: _0 Temp:  99.1 F (37.3 C)    TempSrc:  Axillary    SpO2: 99% 99% 99% 100%  Weight:      Height:        Exam: Gen on vent, sedated, NG tube in place +facial bruising R > L Sclera anicteric, throat w/ ETT No jvd Chest clear anterior/ lateral RRR no MRG Abd soft ntnd no mass or ascites +bs GU w/ foley in place Ext trace UE edema, no LE edema Neuro is on vent, sedated LUA AVF +bruit/ +TDC     Home meds include - eliquis 5 bid, coreg 25 bid, pletal, zetia, pepcid, prozac, hydralazine 25 bid, imdur 60, toprol xl 50 am, crestor, asa 81, prns/ vits/ supps     CXR - bilat IS changes c/w edema    CXR 1/23- no acute    OP HD: [place] [facility]  4h  58.3kg  2/2.25 bath TDC using/ LUA AVF (needs revision still?)  Heparin NONE (was getting 2000 as OP)  - hectorol 1ug tiw     Assessment/ Plan: Acute traumatic SDH - w/ altered MS. SP craniotomy / SDH evacuation 1/21 by neurosurgery.  VDRF - on vent, per CCM Abnormal CXR - + edema on admission, resolved on repeat, suspect neurogenic.  ESRD - on HD TTS. Last HD was OP HD on 1/21. HD today on schedule.  HTN /volume- min edema on exam. Wts are inaccurate. CXR clear 1/23. IV Cleviprex running and home coreg started today.  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    Inpatient medications:  amantadine  100 mg Per Tube BID   amLODipine  10 mg Per Tube Daily   carvedilol  25 mg Per Tube BID WC  chlorhexidine gluconate (MEDLINE KIT)  15 mL Mouth Rinse BID   Chlorhexidine Gluconate Cloth  6 each Topical Daily   Chlorhexidine Gluconate Cloth  6 each Topical Q0600   feeding supplement (PROSource TF)  45 mL Per Tube BID   heparin sodium (porcine)       hydrALAZINE  10 mg Per Tube Q8H   insulin aspart  0-15 Units Subcutaneous Q4H   insulin aspart  2 Units Subcutaneous Q4H   levETIRAcetam  500 mg Per Tube Q T,Th,Sat-1800   levETIRAcetam  500 mg Per Tube Daily   mouth rinse  15 mL Mouth Rinse 10 times per day   pantoprazole sodium  40 mg Per Tube QHS   potassium chloride  20 mEq Per Tube Once    clevidipine 3 mg/hr (05/10/21 1200)   feeding supplement (VITAL 1.5 CAL) 45 mL/hr at 05/10/21 0657   acetaminophen, HYDROcodone-acetaminophen, labetalol, morphine injection, naLOXone (NARCAN)  injection, ondansetron **OR** ondansetron (ZOFRAN) IV

## 2021-05-10 NOTE — TOC Initial Note (Signed)
Transition of Care [facility]) - Initial/Assessment Note    Patient Details  Name: Molly [name] MRN: [id number] Date of Birth: 1951/02/15  [facility]) CM/SW Contact:    Carles Collet, RN Phone Number: 05/10/2021, 1:09 PM  Clinical Narrative:        Patient admitted from ome with spouse with acute subdural hematoma, crani 1/22.  Previously active w [facility] HH. TOC will continue to follow PT OT recs and assess for safe DC plan as patient progresses.             Expected Discharge Plan:  (TBD) Barriers to Discharge: Continued Medical Work up   Patient Goals and CMS Choice Patient states their goals for this hospitalization and ongoing recovery are:: currently intubated unable to interview      Expected Discharge Plan and Services Expected Discharge Plan:  (TBD)   Discharge Planning Services: CM Consult   Living arrangements for the past 2 months: [place]                                      Prior Living Arrangements/Services Living arrangements for the past 2 months: [place] Lives with:: Spouse                   Activities of Daily Living Home Assistive Devices/Equipment: None ADL Screening (condition at time of admission) Patient's cognitive ability adequate to safely complete daily activities?: No Is the patient deaf or have difficulty hearing?: No Does the patient have difficulty seeing, even when wearing glasses/contacts?: No Does the patient have difficulty concentrating, remembering, or making decisions?: Yes Patient able to express need for assistance with ADLs?: No Does the patient have difficulty dressing or bathing?: Yes Independently performs ADLs?: No Communication: Dependent Is this a change from baseline?: Change from baseline, expected to last >3 days Dressing (OT): Dependent Is this a change from baseline?: Change from baseline, expected to last >3 days Grooming: Dependent Is this a change from  baseline?: Change from baseline, expected to last >3 days Feeding: Dependent Is this a change from baseline?: Change from baseline, expected to last >3 days Bathing: Dependent Is this a change from baseline?: Change from baseline, expected to last >3 days Toileting: Dependent Is this a change from baseline?: Change from baseline, expected to last >3days In/Out Bed: Dependent Is this a change from baseline?: Change from baseline, expected to last >3 days Walks in Home: Dependent Is this a change from baseline?: Change from baseline, expected to last >3 days Does the patient have difficulty walking or climbing stairs?: Yes Weakness of Legs: Both Weakness of Arms/Hands: Both  Permission Sought/Granted                  Emotional Assessment              Admission diagnosis:  Trauma [T14.90XA] Subdural hematoma [S06.5XAA] Acute subdural hematoma [S06.5XAA] Patient Active Problem List   Diagnosis Date Noted   Acute respiratory failure with hypoxia ([place]) 05/09/2021   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

## 2021-05-10 NOTE — Progress Notes (Signed)
NAME:  Molly [name], MRN:  [id number], DOB:  05/14/1950, LOS: 3 ADMISSION DATE:  05/04/2021, CONSULTATION DATE:  04/25/2021 REFERRING MD:  Billy Fischer, CHIEF COMPLAINT:  loss of consciousness   History of Present Illness:  70yF with history of ESRD, PAD, chronic diastolic heart failure, OSA, on eliquis for ?AF per husband who was in her USOH until this morning fell and hit her head on a dresser. She felt ok enough to still go to dialysis but over the course of the day got more and more drowsy. Able to complete about an hour and a half of dialysis. Eventually became essentially unresponsive and husband called EMS.  In ED found to have traumatic SDH with midline shift, she was intubated and she was given 1g TXA, andexxanet.   Pertinent  Medical History  ESRD PAD Chronic diastolic heart failure OSA Eliquis for ?AF  Significant Hospital Events: Including procedures, antibiotic start and stop dates in addition to other pertinent events   04/18/2021 intubated, TXA, andexxanet, to OR for decompression/evacuation of right acute subdural hematoma.  1/22 underwent craniotomy with hematoma evacuation with Dr. Arneta Cliche overnight  1/24 repeat CT shows right pneumocephalus and right hemispheric edema, midline shift has improved.  Interim History / Subjective:  No major issues overnight . For HD today.   Objective   Blood pressure (!) 149/52, pulse 68, temperature 99.4 F (37.4 C), temperature source Axillary, resp. rate 14, height 5\' 2"  (1.575 m), weight 70 kg, SpO2 95 %.    Vent Mode: PSV;CPAP FiO2 (%):  [30 %] 30 % Set Rate:  [12 bmp] 12 bmp Vt Set:  [400 mL] 400 mL PEEP:  [5 cmH20] 5 cmH20 Pressure Support:  [12 cmH20-15 cmH20] 15 cmH20 Plateau Pressure:  [14 cmH20-16 cmH20] 16 cmH20   Intake/Output Summary (Last 24 hours) at 05/10/2021 0800 Last data filed at 05/10/2021 0700 Gross per 24 hour  Intake 1535.52 ml  Output 10 ml  Net 1525.52 ml    Filed Weights   04/20/2021 1621  Weight: 70 kg     Examination: General: Acute on chronically ill appearing elderly female lying in bed on mechanical ventilation, in NAD, still on precedex HEENT: ETT, MM pink/moist, PERRL, OGT in place  Neuro: remains stuporous. Will spontaneously move both sides. Did not follow command for me. Now off all sedation.   CV: s1s2 regular rate and rhythm, no murmur, rubs, or gallops,  PULM:  Clear to ascultation, no increased work of breathing, no added breath sounds  GI: soft, bowel sounds active in all 4 quadrants, non-tender, non-distended, tolerating TF Extremities: warm/dry, no edema  Skin: no rashes or lesions  Ancillary tests personally reviewed:   CT head preop showed mixed density SDH on right with midline shift.  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 May take a few days for shift to resolve and level of consciousness to improve and allow extubation.  - Good surgical result, neurologic recovery will take time. Added amantadine as neurostimulant. - Continue to titrate Cleviprex to keep SBP 110-160. Restarted home carvedilol, will add amlodipine and hydralazine.  - For HD today.   Best Practice (right click and "Reselect all SmartList Selections" daily)   Diet/type: NPO w/ meds via tube will have  Cortrak placed as anticipate extended need for nutritional support.  DVT prophylaxis: not indicated GI prophylaxis: N/A Lines: N/A Foley:  N/A Code Status:  full code Last date of multidisciplinary goals of care discussion: husband updated at bedside 1/24.   Critical care time:   CRITICAL CARE Performed by: Kipp Brood  Total critical care time: 9minutes  Critical care time was exclusive of separately billable  procedures and treating other patients.  Critical care was necessary to treat or prevent imminent or life-threatening deterioration.  Critical care was time spent personally by me on the following activities: development of treatment plan with patient and/or surrogate as well as nursing, discussions with consultants, evaluation of patient's response to treatment, examination of patient, obtaining history from patient or surrogate, ordering and performing treatments and interventions, ordering and review of laboratory studies, ordering and review of radiographic studies, pulse oximetry and re-evaluation of patient's condition.  Kipp Brood, MD [facility] ICU Physician [place]  Pager: [phone] Or Epic Secure Chat After hours: [phone].  05/10/2021, 8:00 AM

## 2021-05-10 NOTE — Patient Instructions (Addendum)
DUE TO COVID-19 ONLY ONE VISITOR IS ALLOWED TO COME WITH YOU AND STAY IN THE WAITING ROOM ONLY DURING PRE OP AND PROCEDURE DAY OF SURGERY IF YOU ARE GOING HOME AFTER SURGERY. IF YOU ARE SPENDING THE NIGHT 2 PEOPLE MAY VISIT WITH YOU IN YOUR PRIVATE ROOM AFTER SURGERY UNTIL VISITING  HOURS ARE OVER AT 800 PM AND 1  VISITOR  MAY  SPEND THE NIGHT.                  Molly [name]     Your procedure is scheduled on: 05/13/21   Report to [facility] Main  Entrance   Report to admitting at  6:00 AM     Call this number if you have problems the morning of surgery [phone]    Remember: Do not eat food  or drink:After Midnight the night before your surgery,       BRUSH YOUR TEETH MORNING OF SURGERY AND RINSE YOUR MOUTH OUT, NO CHEWING GUM CANDY OR MINTS.     Take these medicines the morning of surgery with A SIP OF WATER: Imdur, Prozac, Hydralazine, carvedilol   Stop taking _Eliquis__________on __1/27________as instructed by __[place]___________.  Stop taking ____________as directed by your Surgeon/Cardiologist.  Contact your Surgeon/Cardiologist for instructions on Anticoagulant Therapy prior to surgery.                                 You may not have any metal on your body including hair pins and              piercings  Do not wear jewelry, make-up, lotions, powders or perfumes, deodorant             Do not wear nail polish on your fingernails.  Do not shave  48 hours prior to surgery.       .   Do not bring valuables to the hospital. [place].  Contacts, dentures or bridgework may not be worn into surgery.     Patients discharged the day of surgery will not be allowed to drive home. I F YOU ARE HAVING SURGERY AND GOING HOME THE SAME DAY, YOU MUST HAVE AN ADULT TO DRIVE YOU HOME AND BE WITH YOU FOR 24 HOURS. YOU MAY GO HOME BY TAXI OR UBER OR ORTHERWISE, BUT AN ADULT MUST ACCOMPANY YOU HOME AND STAY WITH YOU FOR 24  HOURS.  Name and phone number of your driver:  Special Instructions: N/A              Please read over the following fact sheets you were given: _____________________________________________________________________             [facility] - Preparing for Surgery Before surgery, you can play an important role.  Because skin is not sterile, your skin needs to be as free of germs as possible.  You can reduce the number of germs on your skin by washing with CHG (chlorahexidine gluconate) soap before surgery.  CHG is an antiseptic cleaner which kills germs and bonds with the skin to continue killing germs even after washing. Please DO NOT use if you have an allergy to CHG or antibacterial soaps.  If your skin becomes reddened/irritated stop using the CHG and inform your nurse when you arrive at Short Stay. Do not shave (  including legs and underarms) for at least 48 hours prior to the first CHG shower.   Please follow these instructions carefully:  1.  Shower with CHG Soap the night before surgery and the  morning of Surgery.  2.  If you choose to wash your hair, wash your hair first as usual with your  normal  shampoo.  3.  After you shampoo, rinse your hair and body thoroughly to remove the  shampoo.                            4.  Use CHG as you would any other liquid soap.  You can apply chg directly  to the skin and wash                       Gently with a scrungie or clean washcloth.  5.  Apply the CHG Soap to your body ONLY FROM THE NECK DOWN.   Do not use on face/ open                           Wound or open sores. Avoid contact with eyes, ears mouth and genitals (private parts).                       Wash face,  Genitals (private parts) with your normal soap.             6.  Wash thoroughly, paying special attention to the area where your surgery  will be performed.  7.  Thoroughly rinse your body with warm water from the neck down.  8.  DO NOT shower/wash with your normal soap after using  and rinsing off  the CHG Soap.                9.  Pat yourself dry with a clean towel.            10.  Wear clean pajamas.            11.  Place clean sheets on your bed the night of your first shower and do not  sleep with pets. Day of Surgery : Do not apply any lotions/deodorants the morning of surgery.  Please wear clean clothes to the hospital/surgery center.  FAILURE TO FOLLOW THESE INSTRUCTIONS MAY RESULT IN THE CANCELLATION OF YOUR SURGERY PATIENT SIGNATURE_________________________________  NURSE SIGNATURE__________________________________  ________________________________________________________________________

## 2021-05-10 NOTE — Progress Notes (Addendum)
Pt receives out-pt HD at [facility] on TTS. Pt arrives at 10:15 for 10:30 chair time. Will assist as needed.   Melven Sartorius Renal Navigator [phone]

## 2021-05-10 NOTE — Progress Notes (Signed)
Pt placed back on full vent support due to low RR and low min. Ventilation. Pt tolerating well at this time, RN aware,RT will continue to monitor.

## 2021-05-10 NOTE — Progress Notes (Signed)
Patient ID: Molly [name], female   DOB: [birth year], 71 y.o.   MRN: [id number] BP (!) 146/53    Pulse 73    Temp 97.8 F (36.6 C) (Axillary)    Resp 16    Ht 5\' 2"  (1.575 m)    Wt 68 kg    SpO2 100%    BMI 27.42 kg/m  Eyes are open, tracking to husbands voice Over breathing ventilator Obviously improved.  Head CT much better Pupils reactive to light Improving

## 2021-05-11 ENCOUNTER — Encounter (HOSPITAL_COMMUNITY)
Admission: RE | Admit: 2021-05-11 | Discharge: 2021-05-11 | Disposition: A | Discharge status: Home / Self Care | Payer: Medicare Other | Source: Ambulatory Visit | Attending: Anesthesiology | Admitting: Anesthesiology

## 2021-05-11 ENCOUNTER — Inpatient Hospital Stay (HOSPITAL_COMMUNITY): Payer: Medicare Other

## 2021-05-11 DIAGNOSIS — A419 Sepsis, unspecified organism: Secondary | ICD-10-CM | POA: Diagnosis not present

## 2021-05-11 DIAGNOSIS — I10 Essential (primary) hypertension: Secondary | ICD-10-CM | POA: Diagnosis present

## 2021-05-11 DIAGNOSIS — R579 Shock, unspecified: Secondary | ICD-10-CM

## 2021-05-11 DIAGNOSIS — S065XAA Traumatic subdural hemorrhage with loss of consciousness status unknown, initial encounter: Secondary | ICD-10-CM | POA: Diagnosis not present

## 2021-05-11 LAB — BASIC METABOLIC PANEL
Anion gap: 12 (ref 5–15)
Anion gap: 11 (ref 5–15)
BUN: 25 mg/dL — ABNORMAL HIGH (ref 8–23)
BUN: 32 mg/dL — ABNORMAL HIGH (ref 8–23)
CO2: 25 mmol/L (ref 22–32)
CO2: 22 mmol/L (ref 22–32)
Calcium: 8.3 mg/dL — ABNORMAL LOW (ref 8.9–10.3)
Calcium: 8.5 mg/dL — ABNORMAL LOW (ref 8.9–10.3)
Chloride: 99 mmol/L (ref 98–111)
Chloride: 106 mmol/L (ref 98–111)
Creatinine, Ser: 2.89 mg/dL — ABNORMAL HIGH (ref 0.44–1.00)
Creatinine, Ser: 3.59 mg/dL — ABNORMAL HIGH (ref 0.44–1.00)
GFR, Estimated: 17 mL/min — ABNORMAL LOW (ref >60)
GFR, Estimated: 13 mL/min — ABNORMAL LOW (ref >60)
Glucose, Bld: 177 mg/dL — ABNORMAL HIGH (ref 70–99)
Glucose, Bld: 138 mg/dL — ABNORMAL HIGH (ref 70–99)
Potassium: 4.1 mmol/L (ref 3.5–5.1)
Potassium: 4.6 mmol/L (ref 3.5–5.1)
Sodium: 136 mmol/L (ref 135–145)
Sodium: 139 mmol/L (ref 135–145)

## 2021-05-11 LAB — CBC
HCT: 32.4 % — ABNORMAL LOW (ref 36.0–46.0)
Hemoglobin: 9.7 g/dL — ABNORMAL LOW (ref 12.0–15.0)
MCH: 27.2 pg (ref 26.0–34.0)
MCHC: 29.9 g/dL — ABNORMAL LOW (ref 30.0–36.0)
MCV: 91 fL (ref 80.0–100.0)
Platelets: 179 10*3/uL (ref 150–400)
RBC: 3.56 MIL/uL — ABNORMAL LOW (ref 3.87–5.11)
RDW: 16.3 % — ABNORMAL HIGH (ref 11.5–15.5)
WBC: 4.3 10*3/uL (ref 4.0–10.5)
nRBC: 0.5 % — ABNORMAL HIGH (ref 0.0–0.2)

## 2021-05-11 LAB — POCT I-STAT 7, (LYTES, BLD GAS, ICA,H+H)
Acid-base deficit: 1 mmol/L (ref 0.0–2.0)
Acid-base deficit: 3 mmol/L — ABNORMAL HIGH (ref 0.0–2.0)
Acid-base deficit: 5 mmol/L — ABNORMAL HIGH (ref 0.0–2.0)
Acid-base deficit: 5 mmol/L — ABNORMAL HIGH (ref 0.0–2.0)
Acid-base deficit: 8 mmol/L — ABNORMAL HIGH (ref 0.0–2.0)
Bicarbonate: 19.4 mmol/L — ABNORMAL LOW (ref 20.0–28.0)
Bicarbonate: 20.3 mmol/L (ref 20.0–28.0)
Bicarbonate: 23.3 mmol/L (ref 20.0–28.0)
Bicarbonate: 23.9 mmol/L (ref 20.0–28.0)
Bicarbonate: 25.9 mmol/L (ref 20.0–28.0)
Calcium, Ion: 1.13 mmol/L — ABNORMAL LOW (ref 1.15–1.40)
Calcium, Ion: 1.18 mmol/L (ref 1.15–1.40)
Calcium, Ion: 1.22 mmol/L (ref 1.15–1.40)
Calcium, Ion: 1.22 mmol/L (ref 1.15–1.40)
Calcium, Ion: 1.25 mmol/L (ref 1.15–1.40)
HCT: 27 % — ABNORMAL LOW (ref 36.0–46.0)
HCT: 30 % — ABNORMAL LOW (ref 36.0–46.0)
HCT: 32 % — ABNORMAL LOW (ref 36.0–46.0)
HCT: 32 % — ABNORMAL LOW (ref 36.0–46.0)
HCT: 33 % — ABNORMAL LOW (ref 36.0–46.0)
Hemoglobin: 10.2 g/dL — ABNORMAL LOW (ref 12.0–15.0)
Hemoglobin: 10.9 g/dL — ABNORMAL LOW (ref 12.0–15.0)
Hemoglobin: 10.9 g/dL — ABNORMAL LOW (ref 12.0–15.0)
Hemoglobin: 11.2 g/dL — ABNORMAL LOW (ref 12.0–15.0)
Hemoglobin: 9.2 g/dL — ABNORMAL LOW (ref 12.0–15.0)
O2 Saturation: 73 %
O2 Saturation: 78 %
O2 Saturation: 87 %
O2 Saturation: 92 %
O2 Saturation: 96 %
Patient temperature: 97.6
Patient temperature: 98.6
Patient temperature: 98.6
Patient temperature: 98.6
Potassium: 4.5 mmol/L (ref 3.5–5.1)
Potassium: 4.5 mmol/L (ref 3.5–5.1)
Potassium: 4.6 mmol/L (ref 3.5–5.1)
Potassium: 4.6 mmol/L (ref 3.5–5.1)
Potassium: 4.7 mmol/L (ref 3.5–5.1)
Sodium: 138 mmol/L (ref 135–145)
Sodium: 139 mmol/L (ref 135–145)
Sodium: 139 mmol/L (ref 135–145)
Sodium: 140 mmol/L (ref 135–145)
Sodium: 140 mmol/L (ref 135–145)
TCO2: 20 mmol/L — ABNORMAL LOW (ref 22–32)
TCO2: 22 mmol/L (ref 22–32)
TCO2: 25 mmol/L (ref 22–32)
TCO2: 26 mmol/L (ref 22–32)
TCO2: 27 mmol/L (ref 22–32)
pCO2 arterial: 33.7 mmHg (ref 32.0–48.0)
pCO2 arterial: 51.3 mmHg — ABNORMAL HIGH (ref 32.0–48.0)
pCO2 arterial: 53.5 mmHg — ABNORMAL HIGH (ref 32.0–48.0)
pCO2 arterial: 55.7 mmHg — ABNORMAL HIGH (ref 32.0–48.0)
pCO2 arterial: 60.9 mmHg — ABNORMAL HIGH (ref 32.0–48.0)
pH, Arterial: 7.17 — CL (ref 7.350–7.450)
pH, Arterial: 7.192 — CL (ref 7.350–7.450)
pH, Arterial: 7.259 — ABNORMAL LOW (ref 7.350–7.450)
pH, Arterial: 7.308 — ABNORMAL LOW (ref 7.350–7.450)
pH, Arterial: 7.369 (ref 7.350–7.450)
pO2, Arterial: 103 mmHg (ref 83.0–108.0)
pO2, Arterial: 46 mmHg — ABNORMAL LOW (ref 83.0–108.0)
pO2, Arterial: 49 mmHg — ABNORMAL LOW (ref 83.0–108.0)
pO2, Arterial: 62 mmHg — ABNORMAL LOW (ref 83.0–108.0)
pO2, Arterial: 66 mmHg — ABNORMAL LOW (ref 83.0–108.0)

## 2021-05-11 LAB — GLUCOSE, CAPILLARY
Glucose-Capillary: 151 mg/dL — ABNORMAL HIGH (ref 70–99)
Glucose-Capillary: 156 mg/dL — ABNORMAL HIGH (ref 70–99)
Glucose-Capillary: 169 mg/dL — ABNORMAL HIGH (ref 70–99)
Glucose-Capillary: 197 mg/dL — ABNORMAL HIGH (ref 70–99)
Glucose-Capillary: 70 mg/dL (ref 70–99)

## 2021-05-11 LAB — PHOSPHORUS: Phosphorus: 3.3 mg/dL (ref 2.5–4.6)

## 2021-05-11 LAB — LACTIC ACID, PLASMA: Lactic Acid, Venous: 5.6 mmol/L (ref 0.5–1.9)

## 2021-05-11 LAB — MAGNESIUM: Magnesium: 2.3 mg/dL (ref 1.7–2.4)

## 2021-05-11 MED ORDER — ROCURONIUM BROMIDE 10 MG/ML (PF) SYRINGE
PREFILLED_SYRINGE | INTRAVENOUS | Status: AC
  Filled 2021-05-11: qty 10

## 2021-05-11 MED ORDER — FENTANYL CITRATE PF 50 MCG/ML IJ SOSY
PREFILLED_SYRINGE | INTRAMUSCULAR | Status: AC
  Administered 2021-05-11: 50 ug
  Filled 2021-05-11: qty 2

## 2021-05-11 MED ORDER — MIDAZOLAM HCL 2 MG/2ML IJ SOLN
INTRAMUSCULAR | Status: AC
  Administered 2021-05-11: 2 mg
  Filled 2021-05-11: qty 2

## 2021-05-11 MED ORDER — FENTANYL CITRATE PF 50 MCG/ML IJ SOSY: 25.0000 ug | PREFILLED_SYRINGE | INTRAMUSCULAR | Status: DC | PRN

## 2021-05-11 MED ORDER — MIDAZOLAM HCL 2 MG/2ML IJ SOLN: 1.0000 mg | INTRAMUSCULAR | Status: DC | PRN

## 2021-05-11 MED ORDER — VANCOMYCIN HCL 1250 MG/250ML IV SOLN
1250.0000 mg | Freq: Once | INTRAVENOUS | Status: AC
  Administered 2021-05-11: 1250 mg via INTRAVENOUS
  Filled 2021-05-11: qty 250

## 2021-05-11 MED ORDER — PIPERACILLIN-TAZOBACTAM IN DEX 2-0.25 GM/50ML IV SOLN
2.2500 g | Freq: Three times a day (TID) | INTRAVENOUS | Status: DC
  Administered 2021-05-11 – 2021-05-12 (×2): 2.25 g via INTRAVENOUS
  Filled 2021-05-11 (×4): qty 50

## 2021-05-11 MED ORDER — DEXAMETHASONE SODIUM PHOSPHATE 4 MG/ML IJ SOLN
4.0000 mg | Freq: Four times a day (QID) | INTRAMUSCULAR | Status: DC
  Filled 2021-05-11: qty 1

## 2021-05-11 MED ORDER — VANCOMYCIN HCL 750 MG/150ML IV SOLN
750.0000 mg | INTRAVENOUS | Status: DC
  Filled 2021-05-11: qty 150

## 2021-05-11 MED ORDER — DEXAMETHASONE 4 MG PO TABS
4.0000 mg | ORAL_TABLET | Freq: Four times a day (QID) | ORAL | Status: DC
  Filled 2021-05-11: qty 1

## 2021-05-11 MED ORDER — SODIUM CHLORIDE 0.9 % IV SOLN: 250.0000 mL | INTRAVENOUS | Status: DC

## 2021-05-11 MED ORDER — SUCCINYLCHOLINE CHLORIDE 200 MG/10ML IV SOSY
PREFILLED_SYRINGE | INTRAVENOUS | Status: AC
  Filled 2021-05-11: qty 10

## 2021-05-11 MED ORDER — NOREPINEPHRINE 4 MG/250ML-% IV SOLN
INTRAVENOUS | Status: AC
  Filled 2021-05-11: qty 250

## 2021-05-11 MED ORDER — NOREPINEPHRINE 4 MG/250ML-% IV SOLN
0.0000 ug/min | INTRAVENOUS | Status: DC
  Administered 2021-05-11: 30 ug/min via INTRAVENOUS
  Administered 2021-05-12: 38.133 ug/min via INTRAVENOUS
  Administered 2021-05-12: 8 ug/min via INTRAVENOUS
  Administered 2021-05-12: 14 ug/min via INTRAVENOUS
  Administered 2021-05-12: 20 ug/min via INTRAVENOUS
  Administered 2021-05-12: 22 ug/min via INTRAVENOUS
  Administered 2021-05-12: 16 ug/min via INTRAVENOUS
  Administered 2021-05-12: 20 ug/min via INTRAVENOUS
  Administered 2021-05-13: 2 ug/min via INTRAVENOUS
  Filled 2021-05-11 (×8): qty 250

## 2021-05-11 MED ORDER — HYDRALAZINE HCL 25 MG PO TABS
25.0000 mg | ORAL_TABLET | Freq: Three times a day (TID) | ORAL | Status: DC
  Administered 2021-05-11: 25 mg
  Filled 2021-05-11: qty 1

## 2021-05-11 MED ORDER — SODIUM CHLORIDE 0.9 % IV SOLN
1.5000 g | Freq: Two times a day (BID) | INTRAVENOUS | Status: DC
  Administered 2021-05-11: 1.5 g via INTRAVENOUS
  Filled 2021-05-11 (×2): qty 4

## 2021-05-11 MED ORDER — LACTATED RINGERS IV BOLUS
1000.0000 mL | Freq: Once | INTRAVENOUS | Status: AC
  Administered 2021-05-11: 1000 mL via INTRAVENOUS

## 2021-05-11 MED ORDER — AMANTADINE HCL 50 MG/5ML PO SOLN: 200.0000 mg | ORAL | Status: DC

## 2021-05-11 MED ORDER — ETOMIDATE 2 MG/ML IV SOLN
INTRAVENOUS | Status: AC
  Filled 2021-05-11: qty 20

## 2021-05-11 MED ORDER — POLYETHYLENE GLYCOL 3350 17 G PO PACK: 17.0000 g | PACK | Freq: Every day | ORAL | Status: DC

## 2021-05-11 MED ORDER — MIDAZOLAM HCL 2 MG/2ML IJ SOLN
1.0000 mg | INTRAMUSCULAR | Status: DC | PRN
  Administered 2021-05-12: 1 mg via INTRAVENOUS
  Filled 2021-05-11: qty 2

## 2021-05-11 MED ORDER — SODIUM CHLORIDE 0.9 % IV BOLUS
500.0000 mL | Freq: Once | INTRAVENOUS | Status: AC
  Administered 2021-05-11: 500 mL via INTRAVENOUS

## 2021-05-11 MED ORDER — NOREPINEPHRINE 4 MG/250ML-% IV SOLN
2.0000 ug/min | INTRAVENOUS | Status: DC
  Filled 2021-05-11: qty 250

## 2021-05-11 MED ORDER — HEPARIN SODIUM (PORCINE) 5000 UNIT/ML IJ SOLN
5000.0000 [IU] | Freq: Two times a day (BID) | INTRAMUSCULAR | Status: DC
  Administered 2021-05-11 – 2021-05-16 (×11): 5000 [IU] via SUBCUTANEOUS
  Filled 2021-05-11 (×11): qty 1

## 2021-05-11 MED ORDER — DEXAMETHASONE 4 MG PO TABS
4.0000 mg | ORAL_TABLET | Freq: Four times a day (QID) | ORAL | Status: DC
  Administered 2021-05-11: 4 mg

## 2021-05-11 MED ORDER — DOCUSATE SODIUM 50 MG/5ML PO LIQD: 100.0000 mg | Freq: Two times a day (BID) | ORAL | Status: DC

## 2021-05-11 NOTE — Progress Notes (Signed)
NAME:  Molly [name], MRN:  [id number], DOB:  09-30-1950, LOS: 4 ADMISSION DATE:  04/25/2021, CONSULTATION DATE:  05/15/2021 REFERRING MD:  Billy Fischer, CHIEF COMPLAINT:  loss of consciousness   History of Present Illness:  70yF with history of ESRD, PAD, chronic diastolic heart failure, OSA, on eliquis for ?AF per husband who was in her USOH until this morning fell and hit her head on a dresser. She felt ok enough to still go to dialysis but over the course of the day got more and more drowsy. Able to complete about an hour and a half of dialysis. Eventually became essentially unresponsive and husband called EMS.  In ED found to have traumatic SDH with midline shift, she was intubated and she was given 1g TXA, andexxanet.   Pertinent  Medical History  ESRD PAD Chronic diastolic heart failure OSA Eliquis for ?AF  Significant Hospital Events: Including procedures, antibiotic start and stop dates in addition to other pertinent events   05/12/2021 intubated, TXA, andexxanet, to OR for decompression/evacuation of right acute subdural hematoma.  1/22 underwent craniotomy with hematoma evacuation with Dr. Arneta Cliche overnight  1/24 repeat CT shows right pneumocephalus and right hemispheric edema, midline shift has improved.  Interim History / Subjective:  Much more awake following HD yesterday.   Objective   Blood pressure (!) 142/50, pulse 79, temperature 99.3 F (37.4 C), temperature source Axillary, resp. rate (!) 23, height 5\' 2"  (1.575 m), weight 68 kg, SpO2 93 %.    Vent Mode: PSV;CPAP FiO2 (%):  [30 %] 30 % Set Rate:  [12 bmp] 12 bmp Vt Set:  [400 mL] 400 mL PEEP:  [5 cmH20] 5 cmH20 Pressure Support:  [10 cmH20] 10 cmH20 Plateau Pressure:  [10 cmH20-18 cmH20] 14 cmH20   Intake/Output Summary (Last 24 hours) at 05/11/2021 0913 Last data filed at 05/11/2021 0800 Gross per 24 hour  Intake 1526.86 ml  Output 2000 ml  Net -473.14 ml    Filed Weights   05/05/2021 1621 05/10/21 1351 05/10/21  1721  Weight: 70 kg 70 kg 68 kg    Examination: General: Acute on chronically ill appearing elderly female lying in bed on mechanical ventilation, in NAD, still on precedex HEENT: ETT, MM pink/moist, PERRL, OGT in place  Neuro: more awake and following commands. Will spontaneously move both sides. Did not follow command for me. Now off all sedation.   CV: s1s2 regular rate and rhythm, no murmur, rubs, or gallops,  PULM:  Clear to ascultation, no increased work of breathing, no added breath sounds  GI: soft, bowel sounds active in all 4 quadrants, non-tender, non-distended, tolerating TF Extremities: warm/dry, no edema  Skin: no rashes or lesions  Ancillary tests personally reviewed:   CT head preop showed mixed density SDH on right with midline shift.  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  - Good surgical result, neurologic recovery will take time. Added amantadine as neurostimulant. - Continue to titrate Cleviprex to keep SBP 110-160. Restarted home carvedilol, added amlodipine and hydralazine.  No further increase today as BP may come down further following extubation.    Best Practice (right click and "Reselect all SmartList Selections" daily)   Diet/type: Continue tube feeds via Cortrak DVT prophylaxis: Start heparin as  CT stable.  GI prophylaxis: PPI Lines: N/A Foley: Removed  Code Status:  full code Last date of multidisciplinary goals of care discussion: husband updated at bedside 1/24.   Critical care time:   CRITICAL CARE Performed by: Kipp Brood  Total critical care time: 41minutes  Critical care time was exclusive of separately billable procedures and treating other patients.  Critical care was necessary to treat or prevent  imminent or life-threatening deterioration.  Critical care was time spent personally by me on the following activities: development of treatment plan with patient and/or surrogate as well as nursing, discussions with consultants, evaluation of patient's response to treatment, examination of patient, obtaining history from patient or surrogate, ordering and performing treatments and interventions, ordering and review of laboratory studies, ordering and review of radiographic studies, pulse oximetry and re-evaluation of patient's condition.  Kipp Brood, MD [facility] ICU Physician [place]  Pager: [phone] Or Epic Secure Chat After hours: [phone].  05/11/2021, 9:13 AM

## 2021-05-11 NOTE — Progress Notes (Signed)
Chaplain responded to Code Blue at 8 PM. Chaplain saw patient's husband Eulas Post "Ray" Hyder in the hallway listening to a nurse explain why code was called. Chaplain joined them to support him.  Chaplain learned that Ray and the patient had been happily married for seven years.  His two sons deeply love her. Patient's husband is a natural caretaker.  He took care of his first wife as she died from [place].  He is an Radiographer, therapeutic man and a [employer], as is she.  "I almost lost my faith when my first wife died."  Patient's husband appears to still be in shock as this chaplain watched him tell the story of the past four days to several staff members, one at a time.  Patient was taken for tests.  Chaplain offered to return and support them through the night as needed. Patient's husband is also in regular contact with patient's two brothers who are out of state.  Rev. Tamsen Snider Pager [phone]

## 2021-05-11 NOTE — Progress Notes (Signed)
Patient ID: Molly [name], female   DOB: 1950/12/30, 71 y.o.   MRN: [id number] BP (!) 150/69    Pulse 69    Temp 98.1 F (36.7 C) (Axillary)    Resp (!) 26    Ht 5\' 2"  (1.575 m)    Wt 61.4 kg    SpO2 100%    BMI 24.76 kg/m  Alert, was trying to speak earlier today when I first examined her. Was reintubated later this afternoon. Was tracking, following commands Very hoarse

## 2021-05-11 NOTE — Progress Notes (Signed)
[facility] Progress Note  Summary: 71 y.o. year-old w/ hx of DM2, HTN, ESRD on HD, IBS, PAD who fell striking her head on her nightstand in the morning pre HD. She went to dialysis then came home. Slowly at home her mental status began to deteriorate. Husband called 62 and pt taken to hospital. In ED R pupil was enlarged, head CT showed large SDH.  Pt was taken to OR by neurosurgery for craniotomy w/ SDH evacuation.   Subjective: pt seen in ICU, extubated now  Vitals:   05/11/21 0736 05/11/21 0800 05/11/21 0900 05/11/21 1000  BP: (!) 142/50 (!) 142/50 (!) 129/46 (!) 140/47  Pulse: 79 79 70 73  Resp: (!) 22 (!) 23 (!) 26 (!) 26  Temp:  99.3 F (37.4 C)    TempSrc:  Axillary    SpO2: 99% 93% 97% 95%  Weight:      Height:        Exam: Gen extubated, NG tube in place nonverbal No jvd Chest clear bilat RRR no RG Abd soft ntnd no mass or ascites +bs GU w/ foley in place Ext trace UE edema, no LE edema Neuro not responding verbally LUA AVF +bruit/ +TDC     Home meds include - eliquis 5 bid, coreg 25 bid, pletal, zetia, pepcid, prozac, hydralazine 25 bid, imdur 60, toprol xl 50 am, crestor, asa 81, prns/ vits/ supps     CXR - bilat IS changes c/w edema    CXR 1/23- no acute    OP HD: [place] TTS  4h  58.3kg  2/2.25 bath TDC using/ LUA AVF (needs revision still?)  Heparin NONE (was getting 2000 as OP)  - hectorol 1ug tiw     Assessment/ Plan: Acute traumatic SDH - w/ altered MS. SP craniotomy / SDH evacuation 1/21 by neurosurgery.  VDRF - on vent, per CCM Abnormal CXR - + pulm edema by initial film, was resolved on repeat w/o intervention. Suspect neurogenic.  ESRD - on HD TTS. HD tomorrow.  HTN /volume- min edema on exam. Wts are inaccurate. CXR clear 1/23. IV Cleviprex running and getting norvasc/ coreg/ hydralazine by NG.  Hx atrial fib - eliquis on hold Anemia ckd - Hb 11-13 here, no esa needed MBD ckd - Ca in range, phos 4's, cont vdra PAD - recent LLE  revasc surgery w/ I&D of L foot wound in Dec 2022        Rob Raeden Belzer 05/11/2021, 12:17 PM   Recent Labs  Lab 05/08/21 0537 05/08/21 0842 05/08/21 1609 05/08/21 1627 05/09/21 0618 05/09/21 1643 05/10/21 0514 05/11/21 0726  K 3.4*  --  3.4*  --  3.3*  --  3.2* 4.1  BUN 18  --   --   --  28*  --  37* 25*  CREATININE 2.27*  --   --   --  3.60*  --  4.30* 2.89*  CALCIUM 8.1*  --   --   --  8.1*  --  7.5* 8.3*  PHOS  --    < >  --    < > 4.4 4.2  --   --   HGB 11.0*  --  11.6*  --   --   --   --   --    < > = values in this interval not displayed.    Inpatient medications:  amantadine  100 mg Per Tube BID   amLODipine  10 mg Per Tube Daily   carvedilol  25  mg Per Tube BID WC   chlorhexidine gluconate (MEDLINE KIT)  15 mL Mouth Rinse BID   Chlorhexidine Gluconate Cloth  6 each Topical Daily   Chlorhexidine Gluconate Cloth  6 each Topical Q0600   feeding supplement (PROSource TF)  45 mL Per Tube BID   heparin injection (subcutaneous)  5,000 Units Subcutaneous Q12H   hydrALAZINE  10 mg Per Tube Q8H   insulin aspart  0-15 Units Subcutaneous Q4H   insulin aspart  2 Units Subcutaneous Q4H   levETIRAcetam  500 mg Per Tube Q T,Th,Sat-1800   levETIRAcetam  500 mg Per Tube Daily   mouth rinse  15 mL Mouth Rinse 10 times per day   pantoprazole sodium  40 mg Per Tube QHS    clevidipine 12 mg/hr (05/11/21 1000)   feeding supplement (VITAL 1.5 CAL) 1,000 mL (05/10/21 1320)   acetaminophen, HYDROcodone-acetaminophen, labetalol, morphine injection, naLOXone (NARCAN)  injection, ondansetron **OR** ondansetron (ZOFRAN) IV

## 2021-05-11 NOTE — Progress Notes (Signed)
Called in regards to this patient having a neurological change. According to the nurse she has not been able to get the patient to respond to noxious stimuli since 1900pm. I was also told that the patient had a GCS of 3 since 1400 when she was reintubated. I was called at 1952 and ordered a stat head CT. I was at the bedside at 2006 and the patient had been coding since 2002. CCM at bedside managing.

## 2021-05-11 NOTE — Progress Notes (Signed)
[facility] Progress Note Patient Name: Molly [name] DOB: 1951-01-15 MRN: [id number]   Date of Service  05/11/2021  HPI/Events of Note  Pt with watery stools  eICU Interventions  Placed order for flexiseal        [name] 05/11/2021, 10:29 PM

## 2021-05-11 NOTE — Progress Notes (Addendum)
PCCM INTERVAL PROGRESS NOTE  Called to bedside to evaluate patient due to new onset hypotension. Briefly this is a 71 year old female with ESRD who presented with traumatic SDH 4 days ago and is status post decompressive craniotomy. She was extubated today, however several hours later required reintubation as there was speculation she was aspirating and had become increasingly lethargic. Tonight she developed what sounds like a rapid onset of hypotension. EMD was notified and ordered pressors.  Upon my arrival to bedside ACLS was in progress. See code sheet for details. PEA arrest. Coded for approximately 7 minutes. Received 2 amps epi. Neurosurgery also at bedside.   BP (!) 83/46    Pulse 71    Temp 97.6 F (36.4 C) (Axillary)    Resp (!) 26    Ht 5\' 2"  (1.575 m)    Wt 61.4 kg    SpO2 100%    BMI 24.76 kg/m   General:  acutely ill female  Neuro:  Coma. Pupils 44mm and unreactive.  HEENT:  Several ecchymotic areas on the right side of the face and skull. Surgical dressing in place as well.  Cardiovascular:  RRR, no MRG Lungs:  Clear bilateral breath sounds. Tube-feed colored secretions in suction canister.  Abdomen:  Soft, non-distended Musculoskeletal:  No acute deformity Skin:  Intact, MMM  POCUS with no obvious pneumothorax, no significant pericardial effusion. LV function adequate.  POC ABG 7.19/60/103   Cardiac arrest: PEA unclear etiology. Shock improved after code, but SBP slowly dwindling. May be residual epi propping it up. Most likely distributive shock. Septic vs neurogenic.  - Now with ROSC to ST - CT head STAT - Will need CVL if pressors required - Send stat labs  Acute hypoxemic/hypercapnic respiratory failure: secondary to AMS/SDH. Likely aspirated as well.  CXR with worsening right lung consolidation. C/w aspiration.  - Full vent support. Requiring 100% and 14 PEEP to keep SpO2 88-90%.  - ABG reviewed, settings adjusted.  - CXR now - VAP bundle - L lung down - PRN  sedatives for RASS goal -1.  - Will broaden from Unasyn to Zosyn/Vanco. She has been on the vent for more than 48 hours and with this acute decline will need to treat for HCAP.  - Repeat sputum culute.   Traumatic SDH - Neurosurgery primary and are evaluating the patient presently including repeat CT head.    Critical care time 30 minutes   Georgann Housekeeper, AGACNP-BC [facility]  See Amion for personal pager PCCM on call pager [phone] until 7pm. Please call Elink 7p-7a. [phone]  05/11/2021 8:25 PM

## 2021-05-11 NOTE — Procedures (Signed)
Intubation Procedure Note  Molly [name]  [id number]  May 16, 1950  Date:05/11/21  Time:2:47 PM   Provider Performing:Zelpha Messing    Procedure: Intubation (31500)  Indication(s) Respiratory Failure  Consent Risks of the procedure as well as the alternatives and risks of each were explained to the patient and/or caregiver.  Consent for the procedure was obtained and is signed in the bedside chart   Anesthesia Versed and Fentanyl   Time Out Verified patient identification, verified procedure, site/side was marked, verified correct patient position, special equipment/implants available, medications/allergies/relevant history reviewed, required imaging and test results available.   Sterile Technique Usual hand hygeine, masks, and gloves were used   Procedure Description Patient positioned in bed supine.  Sedation given as noted above.  Patient was intubated with endotracheal tube using Glidescope.  View was Grade 1 full glottis .  Number of attempts was 1.  Colorimetric CO2 detector was consistent with tracheal placement.  Patient had frank reflux of gastric contents into pharynx. Laryngeal function was normal.   Complications/Tolerance None; patient tolerated the procedure well. Chest X-ray is ordered to verify placement.  Kipp Brood, MD [facility] ICU Physician [place]  Pager: [phone] Or Epic Secure Chat After hours: [phone].  05/11/2021, 2:48 PM

## 2021-05-11 NOTE — Procedures (Signed)
Extubation Procedure Note  Patient Details:   Name: Molly [name] DOB: [birth year] MRN: [id number]   Airway Documentation:    Vent end date: 05/11/21 Vent end time: 0830   Evaluation  O2 sats: stable throughout Complications: No apparent complications Patient did tolerate procedure well. Bilateral Breath Sounds: Clear, Diminished   Yes  Pt extubated to 4L [place], pt tolerating well at this time. No stridor noted, cuff leak present, RN at bedside, MD aware, RT will continue to monitor.   Geni Bers Dionis Autry 05/11/2021, 8:40 AM

## 2021-05-11 NOTE — Progress Notes (Incomplete)
STUDENT FOLLOW-UP NOTE - DISREGARD                             CC/HPI:  25 YOF came to ED after she fell and hit her head on a dresser. Her husband noticed a change from her baseline and called EMS. Workup revealed panhemispheric SDH with midline shift. She was then intubated and given 1g TXA, andexxanet for scheduled craniotomy. Now s/p craniotomy 1/21.   PMH: ESRD, PAD, HF, OSA, AF on eliquis  Dx: SDH   weekend A: -stopped CRRT yesterday, planning for comfort care today? -off pressors but MAP 64 this am -few episodes of hypoglycemia yesterday (CBG 16, 37, 79), D5 added, now off     PLAN:  -d/c medications for comfort care    Anticoag: CHADSVASC 6, Hgb 11.6, Plts 161, repeat CT stable, start heparin  1/26 Hgb 8.3, Plts 209 1/27 Hgb 8.7, Plts 158 1/29 Hgb 8.3, Plts 136 1/30 Hgb 7.9, Plts 121  ID: Day 5/5 abx Sepsis secondary to asp. PNA 1/26 WBC 6.3, afebrile 1/27 WBC 9.1, Tmax 100.6 1/29 WBC 22.3, Tmax 101.7 1/30 WBC 21.3, Tmax ***  Antimicrobials: Unasyn 1/25 >> 1/25 Vancomycin 1/25 >> 1/25 Zosyn 1/25 >>   -switch to rocephin   Microbiology results: 1/21 Resp. Panel: negative 1/21 MRSA PCR: negative 1/25 TA: negative  CV: CHADSVASC 6, Goal SBP 110-160 Coreg, Amlodipine, Hydralazine SBPs 140-150s, MAP 80s 1/26 SBPs < 90, MAP in 60s; Levophed  1/27 MAP 70-80s, SBP 140-150 1/30 MAP 64-78, SBPs 110-130s  Endocrine: A1c 5.9, CBGs labile; TF coverage, vsSSI >> mSSI 1/26 CBG controlled, one reading 70 following TF hold, CTM 1/27 CBG low, stop scheduled SSI? 1/30 CBGs labile, several hypoglycemic episodes  GI/Nutrition: LBM 1/25, watery, PPI (famotidine at home) 1/27 BM still uncontrolled 1/30 LBM 1/29  Neuro: s/p craniotomy/SDH evacuation Repeat CT shows improvement, Amantadine added, CPOT 2, RASS 0 1/26 RASS -4, CT decrease pneumocephalus 1/27 RASS -5, GCS 3 1/30 RASS -5, CPOT 0  Renal: HD on T, Th, Sa SCr 4.3 >> 2.89,  CrCl 16.9 Medications renally adjusted 1/26 3.59 >> 3.69 1/27 CRRT, SCr 1.39, CrCl 30.6 1/29 off CRRT, SCr 0.66, CrCl 53.2 1/30 SCr ***, CrCl ***  Pulm: intubated 1/21-1/25, CXR clear 1/27 FiO2 60 1/30 FiO2 40  Heme/Onc:   PTA Med Issues: Acetaminophen 650mg  q4h PRN, Eliquis 5mg  BID, Carvedilol 25mg  BID, Cilostazol 50mg  BID, Zetia 10mg  QAM, Famotidine 20mg  BID PRN, Prozac 10mg  QAM, Hydralazine 25mg  BID, Imdur 60mg  QAM, Crestor 10mg  QAM,   Additional Notes:   Avoid Hydralazine pre-dialysis if intra-dialytic hypotension Keppra: 500 mg to 1 g every 24 hours; supplemental dose of 250 to 500 mg is recommended post each HD session   Adjustments for CRRT: Vanc to q24h Keppra

## 2021-05-11 NOTE — Procedures (Signed)
Central Venous Catheter Insertion Procedure Note  Molly [name]  [id number]  12/05/1950  Date:05/11/21  Time:9:28 PM   Provider Performing:Dain Laseter [place]   Procedure: Insertion of Non-tunneled Central Venous Catheter(36556) with US guidance (50539)   Indication(s) Medication administration  Consent Unable to obtain consent due to emergent nature of procedure.  Anesthesia Topical only with 1% lidocaine   Timeout Verified patient identification, verified procedure, site/side was marked, verified correct patient position, special equipment/implants available, medications/allergies/relevant history reviewed, required imaging and test results available.  Sterile Technique Maximal sterile technique including full sterile barrier drape, hand hygiene, sterile gown, sterile gloves, mask, hair covering, sterile ultrasound probe cover (if used).  Procedure Description Area of catheter insertion was cleaned with chlorhexidine and draped in sterile fashion.  With real-time ultrasound guidance a central venous catheter was placed into the right femoral vein. Nonpulsatile blood flow and easy flushing noted in all ports.  The catheter was sutured in place and sterile dressing applied.  Complications/Tolerance None; patient tolerated the procedure well. Chest X-ray is ordered to verify placement for internal jugular or subclavian cannulation.   Chest x-ray is not ordered for femoral cannulation.  EBL Minimal  Specimen(s) None      Georgann Housekeeper, AGACNP-BC [facility]  See Amion for personal pager PCCM on call pager [phone] until 7pm. Please call Elink 7p-7a. [phone]  05/11/2021 9:29 PM

## 2021-05-11 NOTE — Hospital Course (Signed)
05/11/2021 intubated, TXA, andexxanet, to OR for decompression/evacuation of right acute subdural hematoma.  1/22 underwent craniotomy with hematoma evacuation with Dr. Arneta Cliche overnight  1/24 repeat CT shows right pneumocephalus and right hemispheric edema, midline shift has improved.  1/25 more awake and ready for extubation

## 2021-05-11 NOTE — Progress Notes (Signed)
CPT on hold due to pt desat, hypertension and bradycardia. RT will attempt at next scheduled time.

## 2021-05-11 NOTE — Progress Notes (Signed)
PAT visit cancelled Pt is in the hospital after a fall.

## 2021-05-11 NOTE — Progress Notes (Signed)
Alerted CCM MD Agarwala that patient's arterial line was reading a bp of 108/44 (59) and that the cleviprex had been turned off.  Was given orders to hold next dose of Hydralizine.

## 2021-05-11 NOTE — Progress Notes (Addendum)
Pharmacy Antibiotic Note  Molly [name] is a 71 y.o. female admitted on 05/09/2021 with subdural hematoma with AMS, S/P decompressive craniotomy. Pt was extubated today, but required reintubation, due to concerns about aspiration and increasing lethargy.  This evening, pt developed new-onset hypotension and was started on pressors. Pharmacy has been consulted for Zosyn and vancomycin dosing for aspiration pneumonia and sepsis.  WBC (1/22) 7.8, Tmax 99.3 F  Pt with ESRD, on HD TTS (planning for HD tomorrow)  Plan: Vancomycin 1250 mg IV X 1, followed by vancomycin 750 mg IV with each HD Zosyn 2.25 gm IV Q 8 hrs Monitor WBC, temp, clinical course, cultures  Height: 5\' 2"  (157.5 cm) Weight: 61.4 kg (135 lb 5.8 oz) IBW/kg (Calculated) : 50.1  Temp (24hrs), Avg:98.6 F (37 C), Min:97.6 F (36.4 C), Max:99.3 F (37.4 C)  Recent Labs  Lab 04/21/2021 1614 05/09/2021 1615 04/30/2021 1616 05/08/21 0537 05/09/21 0618 05/10/21 0514 05/11/21 0726  WBC 6.1  --   --  7.8  --   --   --   CREATININE  --   --  1.60* 2.27* 3.60* 4.30* 2.89*  LATICACIDVEN  --  2.0*  --   --   --   --   --     Estimated Creatinine Clearance: 16.1 mL/min (A) (by C-G formula based on SCr of 2.89 mg/dL (H)).    Allergies  Allergen Reactions   Mercury Anaphylaxis    Antimicrobials this admission: 1/22 Cefazolin X 2 1/25 Zosyn >> 1/25 Vancomycin >>  Microbiology results: 1/21 COVID, flu A, flu B: negative 1/21 MRSA PCR: negative  1/25 Tracheal aspirate cx: pending  Thank you for allowing pharmacy to be a part of this patients care.  Gillermina Hu, PharmD, BCPS, [facility] Clinical Pharmacist 05/11/2021 8:44 PM

## 2021-05-11 NOTE — Progress Notes (Signed)
eLink Physician-Brief Progress Note Patient Name: Molly [name] DOB: July 25, 1950 MRN: [id number]   Date of Service  05/11/2021  HPI/Events of Note  Hypotension. PT had been reintubated, on minimal sedation, but BP now 83/46. Had been on cleviprex previously, which had been turned off >3 hours prior to this call.   eICU Interventions  Will start levophed peripherally. Titrate to target MAP >65.         [place] 05/11/2021, 7:57 PM

## 2021-05-11 NOTE — Progress Notes (Signed)
Received protocol order for US guided IV due to vasopressor order. Pt currently has central line. Consult cleared.

## 2021-05-12 ENCOUNTER — Inpatient Hospital Stay (HOSPITAL_COMMUNITY): Payer: Medicare Other

## 2021-05-12 DIAGNOSIS — S065XAA Traumatic subdural hemorrhage with loss of consciousness status unknown, initial encounter: Secondary | ICD-10-CM | POA: Diagnosis not present

## 2021-05-12 DIAGNOSIS — J690 Pneumonitis due to inhalation of food and vomit: Secondary | ICD-10-CM

## 2021-05-12 LAB — COMPREHENSIVE METABOLIC PANEL
ALT: 26 U/L (ref 0–44)
AST: 163 U/L — ABNORMAL HIGH (ref 15–41)
Albumin: 2 g/dL — ABNORMAL LOW (ref 3.5–5.0)
Alkaline Phosphatase: 62 U/L (ref 38–126)
Anion gap: 18 — ABNORMAL HIGH (ref 5–15)
BUN: 39 mg/dL — ABNORMAL HIGH (ref 8–23)
CO2: 15 mmol/L — ABNORMAL LOW (ref 22–32)
Calcium: 7.6 mg/dL — ABNORMAL LOW (ref 8.9–10.3)
Chloride: 100 mmol/L (ref 98–111)
Creatinine, Ser: 3.69 mg/dL — ABNORMAL HIGH (ref 0.44–1.00)
GFR, Estimated: 13 mL/min — ABNORMAL LOW (ref >60)
Glucose, Bld: 242 mg/dL — ABNORMAL HIGH (ref 70–99)
Potassium: 5.1 mmol/L (ref 3.5–5.1)
Sodium: 133 mmol/L — ABNORMAL LOW (ref 135–145)
Total Bilirubin: 0.9 mg/dL (ref 0.3–1.2)
Total Protein: 5.5 g/dL — ABNORMAL LOW (ref 6.5–8.1)

## 2021-05-12 LAB — GLUCOSE, CAPILLARY
Glucose-Capillary: 108 mg/dL — ABNORMAL HIGH (ref 70–99)
Glucose-Capillary: 211 mg/dL — ABNORMAL HIGH (ref 70–99)
Glucose-Capillary: 264 mg/dL — ABNORMAL HIGH (ref 70–99)
Glucose-Capillary: 246 mg/dL — ABNORMAL HIGH (ref 70–99)
Glucose-Capillary: 141 mg/dL — ABNORMAL HIGH (ref 70–99)
Glucose-Capillary: 207 mg/dL — ABNORMAL HIGH (ref 70–99)
Glucose-Capillary: 114 mg/dL — ABNORMAL HIGH (ref 70–99)

## 2021-05-12 LAB — RENAL FUNCTION PANEL
Albumin: 2 g/dL — ABNORMAL LOW (ref 3.5–5.0)
Anion gap: 15 (ref 5–15)
BUN: 33 mg/dL — ABNORMAL HIGH (ref 8–23)
CO2: 16 mmol/L — ABNORMAL LOW (ref 22–32)
Calcium: 7.3 mg/dL — ABNORMAL LOW (ref 8.9–10.3)
Chloride: 101 mmol/L (ref 98–111)
Creatinine, Ser: 2.88 mg/dL — ABNORMAL HIGH (ref 0.44–1.00)
GFR, Estimated: 17 mL/min — ABNORMAL LOW (ref >60)
Glucose, Bld: 277 mg/dL — ABNORMAL HIGH (ref 70–99)
Phosphorus: 4.1 mg/dL (ref 2.5–4.6)
Potassium: 5 mmol/L (ref 3.5–5.1)
Sodium: 132 mmol/L — ABNORMAL LOW (ref 135–145)

## 2021-05-12 LAB — CBC WITH DIFFERENTIAL/PLATELET
Abs Immature Granulocytes: 0.05 10*3/uL (ref 0.00–0.07)
Basophils Absolute: 0 10*3/uL (ref 0.0–0.1)
Basophils Relative: 0 %
Eosinophils Absolute: 0 10*3/uL (ref 0.0–0.5)
Eosinophils Relative: 0 %
HCT: 26.7 % — ABNORMAL LOW (ref 36.0–46.0)
Hemoglobin: 8.3 g/dL — ABNORMAL LOW (ref 12.0–15.0)
Immature Granulocytes: 1 %
Lymphocytes Relative: 14 %
Lymphs Abs: 0.9 10*3/uL (ref 0.7–4.0)
MCH: 27.4 pg (ref 26.0–34.0)
MCHC: 31.1 g/dL (ref 30.0–36.0)
MCV: 88.1 fL (ref 80.0–100.0)
Monocytes Absolute: 0.5 10*3/uL (ref 0.1–1.0)
Monocytes Relative: 7 %
Neutro Abs: 4.9 10*3/uL (ref 1.7–7.7)
Neutrophils Relative %: 78 %
Platelets: 209 10*3/uL (ref 150–400)
RBC: 3.03 MIL/uL — ABNORMAL LOW (ref 3.87–5.11)
RDW: 16.6 % — ABNORMAL HIGH (ref 11.5–15.5)
Smear Review: ADEQUATE
WBC: 6.3 10*3/uL (ref 4.0–10.5)
nRBC: 0.5 % — ABNORMAL HIGH (ref 0.0–0.2)

## 2021-05-12 LAB — LACTIC ACID, PLASMA: Lactic Acid, Venous: 2.7 mmol/L (ref 0.5–1.9)

## 2021-05-12 MED ORDER — PRISMASOL BGK 4/2.5 32-4-2.5 MEQ/L REPLACEMENT SOLN
Status: DC
  Filled 2021-05-12 (×4): qty 5000

## 2021-05-12 MED ORDER — VASOPRESSIN 20 UNITS/100 ML INFUSION FOR SHOCK
0.0000 [IU]/min | INTRAVENOUS | Status: DC
  Administered 2021-05-12 (×2): 0.03 [IU]/min via INTRAVENOUS
  Filled 2021-05-12 (×2): qty 100

## 2021-05-12 MED ORDER — PIPERACILLIN-TAZOBACTAM 3.375 G IVPB: 3.3750 g | Freq: Four times a day (QID) | INTRAVENOUS | Status: DC

## 2021-05-12 MED ORDER — HEPARIN SODIUM (PORCINE) 1000 UNIT/ML DIALYSIS
1000.0000 [IU] | INTRAMUSCULAR | Status: DC | PRN
  Filled 2021-05-12: qty 6

## 2021-05-12 MED ORDER — LEVETIRACETAM 100 MG/ML PO SOLN
750.0000 mg | Freq: Two times a day (BID) | ORAL | Status: AC
  Administered 2021-05-12 – 2021-05-13 (×3): 750 mg
  Filled 2021-05-12 (×3): qty 10

## 2021-05-12 MED ORDER — PRISMASOL BGK 4/2.5 32-4-2.5 MEQ/L EC SOLN
Status: DC
  Filled 2021-05-12 (×28): qty 5000

## 2021-05-12 MED ORDER — PRISMASOL BGK 4/2.5 32-4-2.5 MEQ/L REPLACEMENT SOLN
Status: DC
  Filled 2021-05-12 (×7): qty 5000

## 2021-05-12 MED ORDER — ALTEPLASE 2 MG IJ SOLR: 2.0000 mg | Freq: Once | INTRAMUSCULAR | Status: DC | PRN

## 2021-05-12 MED ORDER — DOXERCALCIFEROL 4 MCG/2ML IV SOLN
1.0000 ug | INTRAVENOUS | Status: DC
  Administered 2021-05-12 – 2021-05-14 (×2): 1 ug via INTRAVENOUS
  Filled 2021-05-12 (×2): qty 2

## 2021-05-12 MED ORDER — B COMPLEX-C PO TABS
1.0000 | ORAL_TABLET | Freq: Every day | ORAL | Status: DC
  Administered 2021-05-12 – 2021-05-16 (×5): 1
  Filled 2021-05-12 (×6): qty 1

## 2021-05-12 MED ORDER — VANCOMYCIN HCL 750 MG/150ML IV SOLN
750.0000 mg | INTRAVENOUS | Status: DC
  Administered 2021-05-12: 750 mg via INTRAVENOUS
  Filled 2021-05-12: qty 150

## 2021-05-12 MED ORDER — PROSOURCE TF PO LIQD
90.0000 mL | Freq: Three times a day (TID) | ORAL | Status: DC
  Administered 2021-05-12 – 2021-05-16 (×12): 90 mL
  Filled 2021-05-12 (×12): qty 90

## 2021-05-12 MED ORDER — PIPERACILLIN-TAZOBACTAM 3.375 G IVPB 30 MIN
3.3750 g | Freq: Four times a day (QID) | INTRAVENOUS | Status: DC
  Administered 2021-05-12 – 2021-05-15 (×12): 3.375 g via INTRAVENOUS
  Filled 2021-05-12 (×22): qty 50

## 2021-05-12 MED ORDER — SODIUM CHLORIDE 0.9 % FOR CRRT: INTRAVENOUS_CENTRAL | Status: DC | PRN

## 2021-05-12 NOTE — Progress Notes (Signed)
[facility] Progress Note  Summary: 71 y.o. year-old w/ hx of DM2, HTN, ESRD on HD, IBS, PAD who fell striking her head on her nightstand in the morning pre HD. She went to dialysis then came home. Slowly at home her mental status began to deteriorate. Husband called 74 and pt taken to hospital. In ED R pupil was enlarged, head CT showed large SDH.  Pt was taken to OR by neurosurgery for craniotomy w/ SDH evacuation.   Subjective: secretions trouble yest, then PEA arrest coded x 7 minutes, epi x 2. Possibly aspirated. Back on vent. CXR ^R base consolidation, Abx broadened to vanc/ zosyn.   Vitals:   05/12/21 0746 05/12/21 0800 05/12/21 0900 05/12/21 1000  BP:  (!) 96/53    Pulse:  63 68 79  Resp:  (!) 36 (!) 39 (!) 29  Temp:  99.9 F (37.7 C) 100 F (37.8 C) 100.2 F (37.9 C)  TempSrc:      SpO2: 100% 100% 99% 99%  Weight:      Height:        Exam: Gen extubated, NG tube in place nonverbal No jvd Chest clear bilat RRR no RG Abd soft ntnd no mass or ascites +bs GU w/ foley in place Ext trace UE edema, no LE edema Neuro sedated LUA AVF +bruit/ +RIJ TDC     Home meds include - eliquis 5 bid, coreg 25 bid, pletal, zetia, pepcid, prozac, hydralazine 25 bid, imdur 60, toprol xl 50 am, crestor, asa 81, prns/ vits/ supps    OP HD: [place] TTS  4h  58.3kg  2/2.25 bath TDC using/ LUA AVF (needs revision still?)  Heparin NONE (was getting 2000 as OP)  - hectorol 1ug tiw     Assessment/ Plan: Acute traumatic SDH - w/ altered MS. SP craniotomy / SDH evacuation 1/21 by neurosurgery.  VDRF - on vent, per CCM Hypotension/ shock - using a-line, on levo gtt per CCM ESRD - on HD TTS. Will need CRRT, see orders.  HTN /volume- min edema on exam, up 2-3kg by wt's. Will keep even w/ CRRT for now Hx atrial fib - eliquis on hold Anemia ckd - Hb 9's here, no esa yet  MBD ckd - Ca in range, phos 4's, cont vdra IV tiw PAD - recent LLE revasc surgery w/ I&D of L foot wound in Dec  2022        Rob Rebekah Zackery 05/12/2021, 10:41 AM   Recent Labs  Lab 05/09/21 1643 05/10/21 0514 05/11/21 2012 05/11/21 2015 05/11/21 2355 05/12/21 0818  K  --    < > 4.6   < > 4.6 5.1  BUN  --    < > 32*  --   --  39*  CREATININE  --    < > 3.59*  --   --  3.69*  CALCIUM  --    < > 8.5*  --   --  7.6*  PHOS 4.2  --  3.3  --   --   --   HGB  --    < > 9.7*   < > 9.2* 8.3*   < > = values in this interval not displayed.    Inpatient medications:  [START ON 05/24/2021] amantadine  200 mg Per Tube Weekly   chlorhexidine gluconate (MEDLINE KIT)  15 mL Mouth Rinse BID   Chlorhexidine Gluconate Cloth  6 each Topical Daily   Chlorhexidine Gluconate Cloth  6 each Topical Q0600   feeding  supplement (PROSource TF)  45 mL Per Tube BID   heparin injection (subcutaneous)  5,000 Units Subcutaneous Q12H   insulin aspart  0-15 Units Subcutaneous Q4H   insulin aspart  2 Units Subcutaneous Q4H   levETIRAcetam  500 mg Per Tube Q T,Th,Sat-1800   levETIRAcetam  500 mg Per Tube Daily   mouth rinse  15 mL Mouth Rinse 10 times per day   pantoprazole sodium  40 mg Per Tube QHS    sodium chloride     feeding supplement (VITAL 1.5 CAL) 45 mL/hr at 05/11/21 2350   norepinephrine (LEVOPHED) Adult infusion 20 mcg/min (05/12/21 1000)   piperacillin-tazobactam (ZOSYN)  IV Stopped (05/12/21 0538)   vancomycin     acetaminophen, fentaNYL (SUBLIMAZE) injection, fentaNYL (SUBLIMAZE) injection, labetalol, midazolam, midazolam, naLOXone (NARCAN)  injection, ondansetron **OR** ondansetron (ZOFRAN) IV

## 2021-05-12 NOTE — Progress Notes (Signed)
NAME:  Molly [name], MRN:  [id number], DOB:  1951/03/17, LOS: 5 ADMISSION DATE:  04/26/2021, CONSULTATION DATE:  05/10/2021 REFERRING MD:  Billy Fischer, CHIEF COMPLAINT:  loss of consciousness   History of Present Illness:  70yF with history of ESRD, PAD, chronic diastolic heart failure, OSA, on eliquis for ?AF per husband who was in her USOH until this morning fell and hit her head on a dresser. She felt ok enough to still go to dialysis but over the course of the day got more and more drowsy. Able to complete about an hour and a half of dialysis. Eventually became essentially unresponsive and husband called EMS.  In ED found to have traumatic SDH with midline shift, she was intubated and she was given 1g TXA, andexxanet.   Pertinent  Medical History  ESRD PAD Chronic diastolic heart failure OSA Eliquis for ?AF  Significant Hospital Events: Including procedures, antibiotic start and stop dates in addition to other pertinent events   04/25/2021 intubated, TXA, andexxanet, to OR for decompression/evacuation of right acute subdural hematoma.  1/22 underwent craniotomy with hematoma evacuation with Dr. Arneta Cliche overnight  1/24 repeat CT shows right pneumocephalus and right hemispheric edema, midline shift has improved.  1/25 attempted extubation yesterday for improving mental status but failed and required reintubation for aspiration.  Interim History / Subjective:  Failed attempt at extubation yesterday with aspiration event and resultant septic shock. PEA arrest last night.    Objective   Blood pressure (!) 132/42, pulse 63, temperature 99.9 F (37.7 C), resp. rate (!) 36, height 5\' 2"  (1.575 m), weight 61.4 kg, SpO2 100 %.    Vent Mode: PRVC FiO2 (%):  [100 %] 100 % Set Rate:  [16 bmp-30 bmp] 30 bmp Vt Set:  [400 mL] 400 mL PEEP:  [5 cmH20-15 cmH20] 14 cmH20 Plateau Pressure:  [23 cmH20-29 cmH20] 29 cmH20   Intake/Output Summary (Last 24 hours) at 05/12/2021 0803 Last data filed at  05/12/2021 0800 Gross per 24 hour  Intake 3119.49 ml  Output --  Net 3119.49 ml    Filed Weights   05/10/21 1351 05/10/21 1721 05/11/21 1200  Weight: 70 kg 68 kg 61.4 kg    Examination: General: Acute on chronically ill appearing elderly female lying in bed on mechanical ventilation, in NAD, still on precedex HEENT: ETT, MM pink/moist, PERRL, OGT in place  Neuro: more awake and following commands. Will spontaneously move both sides. Did not follow command for me. Now off all sedation.   CV: s1s2 regular rate and rhythm, no murmur, rubs, or gallops,  PULM:  Clear to ascultation, no increased work of breathing, no added breath sounds  GI: soft, bowel sounds active in all 4 quadrants, non-tender, non-distended, tolerating TF Extremities: warm/dry, no edema  Skin: no rashes or lesions  Ancillary tests personally reviewed:   CT head preop showed mixed density SDH on right with midline shift.  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  - Continue full ventilatory support and wean FiO2/PEEP per ARDSnet protocol.  - Titrate NE to keep MAP >65.  - Start CRRT for renal support.  - Keep off all sedation.  -  Support through next 72h  - Hopefully mental status will improve as septic shock improves.    Best Practice (right click and "Reselect all SmartList Selections" daily)   Diet/type: Continue tube feeds via Cortrak DVT prophylaxis: Start heparin as CT stable.  GI prophylaxis: PPI Lines: N/A Foley: Removed  Code Status:  full code Last date of multidisciplinary goals of care discussion: husband updated at bedside 1/26.   Critical care  time:   CRITICAL CARE Performed by: Kipp Brood  Total critical care time: 26minutes  Critical care time was exclusive of separately billable procedures and treating other patients.  Critical care was necessary to treat or prevent imminent or life-threatening deterioration.  Critical care was time spent personally by me on the following activities: development of treatment plan with patient and/or surrogate as well as nursing, discussions with consultants, evaluation of patient's response to treatment, examination of patient, obtaining history from patient or surrogate, ordering and performing treatments and interventions, ordering and review of laboratory studies, ordering and review of radiographic studies, pulse oximetry and re-evaluation of patient's condition.  Kipp Brood, MD [facility] ICU Physician [place]  Pager: [phone] Or Epic Secure Chat After hours: [phone].  05/12/2021, 8:03 AM

## 2021-05-12 NOTE — Progress Notes (Signed)
Patient ID: Molly [name], female   DOB: [birth year], 71 y.o.   MRN: [id number] BP (!) 96/53    Pulse 62    Temp (!) 92.7 F (33.7 C)    Resp (!) 26    Ht 5\' 2"  (1.575 m)    Wt 61.4 kg    SpO2 100%    BMI 24.76 kg/m  Intubated, sedated, on pressors On dialysis Events of yesterday noted, PEA, Hypotension.  She is is very poor shape at this time.  Molly [name] is on CRRT. I discussed with her husband what he would like to do. He stated if she were to code again he wants all efforts made to resuscitate her .  He desires all measures that can be taken will be taken to preserve her life. I did explain that she is extremely sick and may not survive despite the best medical treatment.

## 2021-05-12 NOTE — Progress Notes (Signed)
PT Cancellation Note  Patient Details Name: Molly [name] MRN: [id number] DOB: [birth year]   Cancelled Treatment:    Reason Eval/Treat Not Completed: Patient not medically ready - PEA arrest overnight, reintubated. Will check back as appropriate.  Stacie Glaze, PT DPT Acute Rehabilitation Services Pager [phone]  Office [phone]    Louis Matte 05/12/2021, 8:52 AM

## 2021-05-12 NOTE — Progress Notes (Signed)
Patient was hypotensive upon initial assessment, report received from day RN that CCM was aware of continued hypotension and orders were given to hold scheduled hydralazine.  1928 Placed call to [facility] for continued hypotension with MAP in the 50's as well as poor oxygen saturation despite 100% FiO2 ventilator support. RT at bedside as well.  1949 Placed call to neurosurgery on-call line to update regarding unresponsive status change compared to previous RN's recorded assessment.  1950 ELinkMD camera' d into room for patient assessment and assistance, hypotension and poor oxygenation continue to worsen. RT still at bedside along with various unit nurses. Ground team is called to patients room. Verbal order for levophed given and medication started.  2002 Arterial line failing to read, pulse checked, PEA declared, code called and CPR/ACLS protocol initiated. See code sheet for details.  2009 ROSC achieved  Neurosurgery at bedside, requesting STAT head CT upon stabilization.  Critical Care at bedside, orders place include STAT chest XRAY and central line placement for vasoactive infusions.  Verbal order to position patient with right lung elevated and left lung lower to improve oxygenation upon receiving results of chest xray, closely monitor patients temperature administer tylenol if needed.   Dorma Russell, RN

## 2021-05-12 NOTE — Progress Notes (Signed)
OT Cancellation Note  Patient Details Name: Molly [name] MRN: [id number] DOB: 10-May-1950   Cancelled Treatment:    Reason Eval/Treat Not Completed: Patient not medically ready.  Patient not medically ready - PEA arrest overnight, reintubated. Will check back as appropriate.   Shetara Launer D Sueann Brownley 05/12/2021, 10:04 AM

## 2021-05-12 NOTE — Progress Notes (Signed)
Nutrition Follow-up  DOCUMENTATION CODES:   Not applicable  INTERVENTION:   Tube feeding via Cortrak: Vital 1.5 at 45 ml/h (1080 ml per day) Incease Prosource TF 90 ml TID  Provides 1860 kcal, 138 gm protein, 820 ml free water daily   NUTRITION DIAGNOSIS:   Increased nutrient needs related to  (trauma) as evidenced by estimated needs. Ongoing.   GOAL:   Patient will meet greater than or equal to 90% of their needs Met with TF at goal  MONITOR:   TF tolerance  REASON FOR ASSESSMENT:   Consult, Ventilator Enteral/tube feeding initiation and management  ASSESSMENT:   Pt with PMH of IBS, ESRD, PAD, s/p LLE revasc surgery with I&D of L foot wound 12/22, CHF, OSA who was admitted 1/21 from HD after becoming unresponsive after fall and hitting her head that morning.    Pt discussed during ICU rounds and with RN.  Per renal EDW: 59.8 kg Remains on 2 pressors after PEA arrest, Vent with fiO2 now down to 90%  12/21 s/p crani for evacuation or R acute SDH 1/23 s/p cortrak tube; gastric placement 1/25 extubated but then re-intubated; PEA arrest overnight with ROSC after 7 minutes  1/26 starting CRRT  Patient is currently intubated on ventilator support MV: 15.2 L/min Temp (24hrs), Avg:99.4 F (37.4 C), Min:97.6 F (36.4 C), Max:100.6 F (38.1 C)   Medications reviewed and include: SSI, 2 units novolog every 4 hours, protonix Keppra Levophed @ 22 mcg Vasopressin @ .03 units  MAP: 61-69  Labs reviewed: Na 133, K/Phos/mag WNL, AST: 163 A1C: 5.9 CBG's: 70-264   UOP: x1  1/24 UF: 2000 ml   Current TF:  Vital 1.5 at 45 ml/h with Prosource TF 45 ml BID Provides 1700 kcal, 94 gm protein  Diet Order:   Diet Order             Diet NPO time specified  Diet effective now                   EDUCATION NEEDS:   Not appropriate for education at this time  Skin:  Skin Assessment: Skin Integrity Issues: Skin Integrity Issues:: Diabetic Ulcer 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  Estimated Nutritional Needs:   Kcal:  1600-1800  Protein:  120-150 grams  Fluid:  1.2 L/day  Lockie Pares., RD, LDN, CNSC See AMiON for contact information

## 2021-05-12 NOTE — Progress Notes (Addendum)
Pharmacy Antibiotic Note  Molly [name] is a 9YOF with ESRD admitted on 05/02/2021 with subdural hematoma and altered mental status, s/p decompressive craniotomy. She was extubated 1/25 for improving mental status but was reintubated for concerns of aspiration. Yesterday evening she became hypotensive and unresponsive, a code blue was called, and she was placed on pressor support. She remains hypotensive on increasing pressor therapy and is being started on CRRT.   Pharmacy has been consulted for Vancomycin and Zosyn dosing for aspiration/septic shock.  Tmax 100.2, WBC up to 6.3.   Plan: Change Zosyn to 3.375gm IV Q6H - 30 min infusion Change Vancomycin to 750mg  IV every 24 hours for level 15-20 mcg/mL Check trough level at steady state Follow-up temp, CBC, clinical progress  Adjust Keppra to 750mg  IV Q12H with CRRT initiation   Height: 5\' 2"  (157.5 cm) Weight: 61.4 kg (135 lb 5.8 oz) IBW/kg (Calculated) : 50.1  Temp (24hrs), Avg:99.2 F (37.3 C), Min:97.6 F (36.4 C), Max:100.2 F (37.9 C)  Recent Labs  Lab 04/30/2021 1614 05/12/2021 1615 05/09/2021 1616 05/08/21 0537 05/09/21 0618 05/10/21 0514 05/11/21 0726 05/11/21 2012 05/12/21 0117 05/12/21 0818  WBC 6.1  --   --  7.8  --   --   --  4.3  --  6.3  CREATININE  --   --    < > 2.27* 3.60* 4.30* 2.89* 3.59*  --  3.69*  LATICACIDVEN  --  2.0*  --   --   --   --   --  5.6* 2.7*  --    < > = values in this interval not displayed.    Estimated Creatinine Clearance: 12.6 mL/min (A) (by C-G formula based on SCr of 3.69 mg/dL (H)).    Allergies  Allergen Reactions   Mercury Anaphylaxis    Antimicrobials this admission: 1/22 Cefazolin x 1 for surgical ppx 1/25 Unasyn x 1 1/25 Vancomycin >>> 1/25 Zosyn >>   Microbiology results: 1/21 COVID, flu A, flu B: negative 1/21 MRSA PCR: negative  1/25 Tracheal aspirate cx: pending  Thank you for allowing pharmacy to be a part of this patients care.  Rico Junker 05/12/2021  11:27 AM  Remigio Eisenmenger D. Mina Marble, PharmD, BCPS, [place] 05/12/2021, 12:21 PM

## 2021-05-13 ENCOUNTER — Ambulatory Visit (HOSPITAL_COMMUNITY): Admission: RE | Admit: 2021-05-13 | Payer: Medicare Other | Source: Home / Self Care | Admitting: Urology

## 2021-05-13 ENCOUNTER — Encounter (HOSPITAL_COMMUNITY): Admission: RE | Payer: Self-pay | Source: Home / Self Care

## 2021-05-13 DIAGNOSIS — J9601 Acute respiratory failure with hypoxia: Secondary | ICD-10-CM | POA: Diagnosis not present

## 2021-05-13 DIAGNOSIS — I469 Cardiac arrest, cause unspecified: Secondary | ICD-10-CM

## 2021-05-13 DIAGNOSIS — J690 Pneumonitis due to inhalation of food and vomit: Secondary | ICD-10-CM

## 2021-05-13 DIAGNOSIS — A419 Sepsis, unspecified organism: Secondary | ICD-10-CM | POA: Diagnosis not present

## 2021-05-13 DIAGNOSIS — S065XAA Traumatic subdural hemorrhage with loss of consciousness status unknown, initial encounter: Secondary | ICD-10-CM | POA: Diagnosis not present

## 2021-05-13 DIAGNOSIS — R6521 Severe sepsis with septic shock: Secondary | ICD-10-CM

## 2021-05-13 LAB — CBC WITH DIFFERENTIAL/PLATELET
Abs Immature Granulocytes: 0 10*3/uL (ref 0.00–0.07)
Basophils Absolute: 0 10*3/uL (ref 0.0–0.1)
Basophils Relative: 0 %
Eosinophils Absolute: 0 10*3/uL (ref 0.0–0.5)
Eosinophils Relative: 0 %
HCT: 27.4 % — ABNORMAL LOW (ref 36.0–46.0)
Hemoglobin: 8.7 g/dL — ABNORMAL LOW (ref 12.0–15.0)
Lymphocytes Relative: 7 %
Lymphs Abs: 0.6 10*3/uL — ABNORMAL LOW (ref 0.7–4.0)
MCH: 27.5 pg (ref 26.0–34.0)
MCHC: 31.8 g/dL (ref 30.0–36.0)
MCV: 86.7 fL (ref 80.0–100.0)
Monocytes Absolute: 0.8 10*3/uL (ref 0.1–1.0)
Monocytes Relative: 9 %
Neutro Abs: 7.6 10*3/uL (ref 1.7–7.7)
Neutrophils Relative %: 84 %
Platelets: 158 10*3/uL (ref 150–400)
RBC: 3.16 MIL/uL — ABNORMAL LOW (ref 3.87–5.11)
RDW: 16.7 % — ABNORMAL HIGH (ref 11.5–15.5)
WBC: 9.1 10*3/uL (ref 4.0–10.5)
nRBC: 1.3 % — ABNORMAL HIGH (ref 0.0–0.2)
nRBC: 4 /100 WBC — ABNORMAL HIGH

## 2021-05-13 LAB — POCT I-STAT 7, (LYTES, BLD GAS, ICA,H+H)
Acid-Base Excess: 0 mmol/L (ref 0.0–2.0)
Bicarbonate: 23.7 mmol/L (ref 20.0–28.0)
Calcium, Ion: 1.07 mmol/L — ABNORMAL LOW (ref 1.15–1.40)
HCT: 25 % — ABNORMAL LOW (ref 36.0–46.0)
Hemoglobin: 8.5 g/dL — ABNORMAL LOW (ref 12.0–15.0)
O2 Saturation: 99 %
Patient temperature: 35.1
Potassium: 4.3 mmol/L (ref 3.5–5.1)
Sodium: 140 mmol/L (ref 135–145)
TCO2: 25 mmol/L (ref 22–32)
pCO2 arterial: 32.1 mmHg (ref 32.0–48.0)
pH, Arterial: 7.469 — ABNORMAL HIGH (ref 7.350–7.450)
pO2, Arterial: 130 mmHg — ABNORMAL HIGH (ref 83.0–108.0)

## 2021-05-13 LAB — RENAL FUNCTION PANEL
Albumin: 2.1 g/dL — ABNORMAL LOW (ref 3.5–5.0)
Albumin: 1.9 g/dL — ABNORMAL LOW (ref 3.5–5.0)
Anion gap: 8 (ref 5–15)
Anion gap: 13 (ref 5–15)
BUN: 16 mg/dL (ref 8–23)
BUN: 13 mg/dL (ref 8–23)
CO2: 22 mmol/L (ref 22–32)
CO2: 21 mmol/L — ABNORMAL LOW (ref 22–32)
Calcium: 7.6 mg/dL — ABNORMAL LOW (ref 8.9–10.3)
Calcium: 7.5 mg/dL — ABNORMAL LOW (ref 8.9–10.3)
Chloride: 107 mmol/L (ref 98–111)
Chloride: 105 mmol/L (ref 98–111)
Creatinine, Ser: 1.39 mg/dL — ABNORMAL HIGH (ref 0.44–1.00)
Creatinine, Ser: 1.01 mg/dL — ABNORMAL HIGH (ref 0.44–1.00)
GFR, Estimated: 41 mL/min — ABNORMAL LOW (ref >60)
GFR, Estimated: >60 mL/min (ref >60)
Glucose, Bld: 89 mg/dL (ref 70–99)
Glucose, Bld: 141 mg/dL — ABNORMAL HIGH (ref 70–99)
Phosphorus: 2.4 mg/dL — ABNORMAL LOW (ref 2.5–4.6)
Phosphorus: 3.1 mg/dL (ref 2.5–4.6)
Potassium: 4.3 mmol/L (ref 3.5–5.1)
Potassium: 4 mmol/L (ref 3.5–5.1)
Sodium: 137 mmol/L (ref 135–145)
Sodium: 139 mmol/L (ref 135–145)

## 2021-05-13 LAB — HEPATIC FUNCTION PANEL
ALT: 120 U/L — ABNORMAL HIGH (ref 0–44)
AST: 677 U/L — ABNORMAL HIGH (ref 15–41)
Albumin: 2.1 g/dL — ABNORMAL LOW (ref 3.5–5.0)
Alkaline Phosphatase: 53 U/L (ref 38–126)
Bilirubin, Direct: 0.3 mg/dL — ABNORMAL HIGH (ref 0.0–0.2)
Indirect Bilirubin: 0.4 mg/dL (ref 0.3–0.9)
Total Bilirubin: 0.7 mg/dL (ref 0.3–1.2)
Total Protein: 5.8 g/dL — ABNORMAL LOW (ref 6.5–8.1)

## 2021-05-13 LAB — GLUCOSE, CAPILLARY
Glucose-Capillary: 113 mg/dL — ABNORMAL HIGH (ref 70–99)
Glucose-Capillary: 107 mg/dL — ABNORMAL HIGH (ref 70–99)
Glucose-Capillary: 58 mg/dL — ABNORMAL LOW (ref 70–99)
Glucose-Capillary: 81 mg/dL (ref 70–99)
Glucose-Capillary: 136 mg/dL — ABNORMAL HIGH (ref 70–99)
Glucose-Capillary: 169 mg/dL — ABNORMAL HIGH (ref 70–99)
Glucose-Capillary: 137 mg/dL — ABNORMAL HIGH (ref 70–99)

## 2021-05-13 LAB — APTT: aPTT: 45 seconds — ABNORMAL HIGH (ref 24–36)

## 2021-05-13 LAB — MAGNESIUM: Magnesium: 2.3 mg/dL (ref 1.7–2.4)

## 2021-05-13 SURGERY — CYSTOSCOPY/URETEROSCOPY/HOLMIUM LASER/STENT PLACEMENT
Anesthesia: General | Laterality: Left

## 2021-05-13 MED ORDER — SODIUM PHOSPHATES 45 MMOLE/15ML IV SOLN
25.0000 mmol | Freq: Once | INTRAVENOUS | Status: AC
  Administered 2021-05-13: 25 mmol via INTRAVENOUS
  Filled 2021-05-13: qty 8.33

## 2021-05-13 MED ORDER — OSMOLITE 1.5 CAL PO LIQD
1000.0000 mL | ORAL | Status: DC
  Administered 2021-05-13 – 2021-05-15 (×3): 1000 mL

## 2021-05-13 MED ORDER — ARTIFICIAL TEARS OPHTHALMIC OINT
TOPICAL_OINTMENT | Freq: Three times a day (TID) | OPHTHALMIC | Status: DC
  Administered 2021-05-13 – 2021-05-15 (×4): 1 via OPHTHALMIC
  Filled 2021-05-13 (×2): qty 3.5

## 2021-05-13 MED ORDER — SODIUM CHLORIDE 0.9 % IV SOLN: INTRAVENOUS | Status: DC

## 2021-05-13 MED ORDER — NUTRISOURCE FIBER PO PACK
1.0000 | PACK | Freq: Three times a day (TID) | ORAL | Status: DC
  Administered 2021-05-13: 1
  Filled 2021-05-13 (×2): qty 1

## 2021-05-13 MED ORDER — DEXTROSE 50 % IV SOLN
25.0000 mL | Freq: Once | INTRAVENOUS | Status: AC
  Administered 2021-05-13: 25 mL via INTRAVENOUS
  Filled 2021-05-13: qty 50

## 2021-05-13 NOTE — Progress Notes (Signed)
Inpatient Diabetes Program Recommendations  AACE/ADA: New Consensus Statement on Inpatient Glycemic Control (2015)  Target Ranges:  Prepandial:   less than 140 mg/dL      Peak postprandial:   less than 180 mg/dL (1-2 hours)      Critically ill patients:  140 - 180 mg/dL   Lab Results  Component Value Date   GLUCAP 81 05/13/2021   HGBA1C 5.9 (H) 05/08/2021    Latest Reference Range & Units 05/12/21 23:11 05/13/21 03:08 05/13/21 07:42 05/13/21 07:43 05/13/21 08:26 05/13/21 11:24  Glucose-Capillary 70 - 99 mg/dL 114 (H) 113 (H) <10 (LL) 58 (L) 107 (H) 81  (LL): Data is critically low (H): Data is abnormally high (L): Data is abnormally low  Review of Glycemic Control  Current orders for Inpatient glycemic control: Novolog MODERATE (0-15 units) correction scale every 4 hours, Novolog 2 units every 4 hours  Inpatient Diabetes Program Recommendations:   Noted that patient had a CBG this am of <10 mg/dl.  Recommend decreasing Novolog correction scale to SENSITIVE (0-9 units) every 4 hours. If blood sugars continue to be low, recommend Novolog 0-6 units very sensitive scale every 4 hours.   Harvel Ricks RN BSN CDE Diabetes Coordinator Pager: [phone]  8am-5pm

## 2021-05-13 NOTE — Progress Notes (Signed)
Nutrition Follow-up  DOCUMENTATION CODES:   Not applicable  INTERVENTION:   - Tube feeding via Cortrak: Transition to Osmolite 1.5 @  45 mL/hr (1080 ml per day) Continue ProSource TF 90 ml TID  Provides 1860 kcal, 134 gm protein, 823 ml free water daily  - Discontinue fiber supplement  NUTRITION DIAGNOSIS:   Increased nutrient needs related to  (trauma) as evidenced by estimated needs. Ongoing.   GOAL:   Patient will meet greater than or equal to 90% of their needs Met with TF at goal  MONITOR:   Vent status, TF tolerance, I & O's, Labs  REASON FOR ASSESSMENT:   Consult, Ventilator Enteral/tube feeding initiation and management  ASSESSMENT:   Pt with PMH of IBS, ESRD, PAD, s/p LLE revasc surgery with I&D of L foot wound 12/22, CHF, OSA who was admitted 1/21 from HD after becoming unresponsive after fall and hitting her head that morning.    12/21 s/p crani for evacuation or R acute SDH 1/23 s/p cortrak tube; gastric placement 1/25 extubated but then re-intubated; PEA arrest overnight with ROSC after 7 minutes  1/26 starting CRRT  Received a consult regarding pt tube feedings. Concern for high output, possible intolerance.  Plan to transition pt to Osmolite 1.5, sent RN a secure chat to discuss change.   Husband at bedside. Husband reports no food intolerance's that he is aware of.   Patient is currently intubated on ventilator support. Remains on CRRT. MV: 14.3 L/min Temp (24hrs), Avg:94.3 F (34.6 C), Min:92.5 F (33.6 C), Max:96.3 F (35.7 C)  Pt now off pressors.   I/O's: 2.2 L of stool in 24 hr  Medications reviewed and include: B complex w/ C, Hectorol, SSI 0-15 units q4h, Protonix, IV antibiotics Labs reviewed: Phosphorus 2.4  Current TF:  Vital 1.5 at 45 ml/h with Prosource TF 45 ml BID Provides 1700 kcal, 94 gm protein  Diet Order:   Diet Order             Diet NPO time specified  Diet effective now                   EDUCATION  NEEDS:   Not appropriate for education at this time  Skin:  Skin Assessment: Skin Integrity Issues: Skin Integrity Issues:: Diabetic Ulcer Diabetic Ulcer: L heel and L toe  Last BM:  1/27 via rectal tube  Height:   Ht Readings from Last 1 Encounters:  04/29/2021 5' 2"  (1.575 m)    Weight:   Wt Readings from Last 1 Encounters:  05/13/21 59.3 kg    BMI:  Body mass index is 23.91 kg/m.  Estimated Nutritional Needs:   Kcal:  1600-1800  Protein:  120-150 grams  Fluid:  1.2 L/day   Roxana Hires, RD, LDN Clinical Dietitian See [facility] for contact information.

## 2021-05-13 NOTE — Progress Notes (Signed)
NAME:  Molly [name], MRN:  [id number], DOB:  13-Sep-1950, LOS: 6 ADMISSION DATE:  04/27/2021, CONSULTATION DATE:  04/17/2021 REFERRING MD:  Billy Fischer, CHIEF COMPLAINT:  loss of consciousness   History of Present Illness:  70yF with history of ESRD, PAD, chronic diastolic heart failure, OSA, on eliquis for ?AF per husband who was in her USOH until this morning fell and hit her head on a dresser. She felt ok enough to still go to dialysis but over the course of the day got more and more drowsy. Able to complete about an hour and a half of dialysis. Eventually became essentially unresponsive and husband called EMS.  In ED found to have traumatic SDH with midline shift, she was intubated and she was given 1g TXA, andexxanet.   She remains critically ill.  Pertinent  Medical History  ESRD PAD Chronic diastolic heart failure OSA Eliquis for ?AF  Significant Hospital Events: Including procedures, antibiotic start and stop dates in addition to other pertinent events   05/10/2021 intubated, TXA, andexxanet, to OR for decompression/evacuation of right acute subdural hematoma.  1/22 underwent craniotomy with hematoma evacuation with Dr. Arneta Cliche overnight  1/24 repeat CT shows right pneumocephalus and right hemispheric edema, midline shift has improved.  1/25 attempted extubation yesterday for improving mental status but failed and required reintubation for aspiration.  1/26 Failed attempt at extubation yesterday with aspiration event and resultant septic shock. PEA arrest last night. CRRT resumed. Interim History / Subjective:  No acute events overnight. CRRT resumed.   Tmax 100.6 +4.9L admit, 2000 stool, 1700 removed, -200 past 24  2 levophed, 60% fio2  Unable to obtain subjective evaluation due to patient status   Objective   Blood pressure (!) 122/44, pulse 74, temperature (!) 95.2 F (35.1 C), temperature source Esophageal, resp. rate (!) 36, height 5\' 2"  (1.575 m), weight 59.3 kg, SpO2  100 %.    Vent Mode: PRVC FiO2 (%):  [60 %-90 %] 60 % Set Rate:  [30 bmp] 30 bmp Vt Set:  [400 mL] 400 mL PEEP:  [10 cmH20-14 cmH20] 10 cmH20 Plateau Pressure:  [20 cmH20-33 cmH20] 27 cmH20   Intake/Output Summary (Last 24 hours) at 05/13/2021 0920 Last data filed at 05/13/2021 0900 Gross per 24 hour  Intake 2914.98 ml  Output 3875 ml  Net -960.02 ml   Filed Weights   05/10/21 1721 05/11/21 1200 05/13/21 0500  Weight: 68 kg 61.4 kg 59.3 kg    Examination: General: In bed, NAD, appears comfortable HEENT: MM pink/moist, anicteric, atraumatic Neuro: RASS -5, PERRL 39mm, GCS 3t, breathing over, no C/C/G CV: S1S2, NSR, no m/r/g appreciated PULM:  air movement in all lobes, trachea midline, chest expansion symmetric GI: soft, bsx4 active, non-tender   Extremities: cool/dry, no pretibial edema, capillary refill less than 3 seconds  Skin: Post surgical sites c/d/I, PU to left foot, no rashes or lesions noted    Ancillary tests personally reviewed:   BG 89-264 WBC 6.3>9.1 HGB 8.3>8.7 Creat 2.88>1.39, BUN 33>16 AST 677, ALT 120  Assessment & Plan:  Acute subdural hematoma-traumatic, post mechanical fall, on anticoagulation PTA ?Anoxic brain injury vs Acute metabolic encephalopathy Significant aspiration pneumonia represents major setback and may impact long-term recovery. 48 hours post arrest with continued GCS 3. No improvement with ABX or CRRT. Suspect secondary injury due to decreased cerebral blood flow with 7 min CPR - NSGY primary - Titrate NE to keep MAP >65.  - Keep off all sedation.  -Continue neuroprotective measures-  normothermia, euglycemia, HOB greater than 30, head in neutral alignment, normocapnia, normoxia.   Acute hypoxemic/hypercapnic respiratory failure: secondary to AMS/SDH. Likely aspirated as well.  PEA Arrest, in hospital- suspect secondary to aspiration event/hypoxia, 7 minutes total CPR with ROSC. Septic shock, secondary to aspiration pneumonia from  gastric secretions MRSA swab negative, levophed down to 60mcg, FiO2 down from 100% to 60% - Titrate NE to keep MAP >65.  -LTVV strategy with tidal volumes of 4-8 cc/kg ideal body weight -Goal plateau pressures less than 30 and driving pressures less than 15 -Wean PEEP/FiO2 for SpO2 92-98% -VAP bundle -Daily SAT and SBT -Hold sedation -RASS goal 0 -Follow intermittent CXR and ABG PRN -Titrate NE to keep MAP >65.  -Vanc stopped, continue Zosyn (day 2/5)  CKD IV on HD (TTS) -Neph consulted -Continue CRRT  HX HTN HX Afib on eliquis On NE, in NSR -Holding home antihypertensives in the setting of shock - Titrate NE to keep MAP >65.  -AC, if needed, per neuro.   Transaminitis, suspect secondary to shock/arrest - Trend hepatic function - avoid hepatotoxic agent  Diarrhea 2L out, appears like tube feed, abd exam benign -RD consult -Consider TF change  DNR/DNI Spoke with husband Ray at bedside. Explained that after 48 hours the patient remained completely unresponsive despite antibiotics and CRRT. Patient was f/c prior to arrest. Suspect 7 minute arrest has signigicantly impacted neurological function and secondary injury will limit recovery. Discussed that if her heart were to stop CPR would likely do more harm than good. Husband agreed to make DNR but to continue treating the treatable. -DNR orders placed.   Best Practice (right click and "Reselect all SmartList Selections" daily)   Diet/type: Continue tube feeds via Cortrak DVT prophylaxis: heparin GI prophylaxis: PPI Lines: N/A Foley: Removed  Code Status:  full code Last date of multidisciplinary goals of care discussion: husband updated at bedside 1/27. See above.    Critical care time: 41 minutes   Redmond School., MSN, APRN, AGACNP-BC [facility]  05/13/2021 , 9:21 AM  Please see [email or web address] for pager details  If no response, please call [phone] After hours, please call Elink at  [phone]

## 2021-05-13 NOTE — Progress Notes (Signed)
OT Cancellation Note  Patient Details Name: Molly [name] MRN: [id number] DOB: February 21, 1951   Cancelled Treatment:    Reason Eval/Treat Not Completed: Patient not medically ready.  RN requesting therapies to hold due to medical status.  Okay to wait till Monday.    Kegan Shepardson D [name] 05/13/2021, 11:08 AM

## 2021-05-13 NOTE — Progress Notes (Addendum)
[facility] Progress Note  Presentation summary: 71 y.o. year-old w/ hx of DM2, HTN, ESRD on HD, IBS, PAD who fell striking her head on her nightstand in the morning pre HD. She went to dialysis then came home. Slowly at home her mental status began to deteriorate. Husband called 1 and pt taken to hospital. In ED R pupil was enlarged, head CT showed large SDH.  Pt was taken to OR by neurosurgery for craniotomy w/ SDH evacuation.   Subjective: levo gtt almost off. Pt is now DNR.   Vitals:   05/13/21 1100 05/13/21 1123 05/13/21 1142 05/13/21 1200  BP:      Pulse: 75 76 76 77  Resp: (!) 35 (!) 36 (!) 34 (!) 37  Temp: (!) 95.2 F (35.1 C) (!) 95.4 F (35.2 C) (!) 95.4 F (35.2 C) (!) 95.7 F (35.4 C)  TempSrc:    Esophageal  SpO2: 100% 100% 100% 100%  Weight:      Height:        Exam: on vent ,sedated, facial bruising  no jvd  throat ett in place  Chest cta bilat and lat  Cor reg no RG  Abd soft ntnd no ascites   Ext no LE edema   Neuro on vent and sedated, not following commands  LUA AVF +bruit/ +RIJ TDC     Home meds include - eliquis 5 bid, coreg 25 bid, pletal, zetia, pepcid, prozac, hydralazine 25 bid, imdur 60, toprol xl 50 am, crestor, asa 81, prns/ vits/ supps    OP HD: [place] TTS  4h  58.3kg  2/2.25 bath TDC using/ LUA AVF (needs revision still?)  Heparin NONE (was getting 2000 as OP)  - hectorol 1ug tiw     Assessment/ Plan: Acute traumatic SDH - w/ altered MS. SP craniotomy / SDH evacuation 1/21 by neurosurgery.  PEA arrest - 1/25 Anoxic encephelopathy VDRF - per CCM Hypotension/ shock - titrating levo gtt off this am  ESRD - on HD TTS. Last iHD 1/24. CRRT started 1/26. Will continue for now.  HTN /volume- no sig edema, at dry wt today, +GI losses. Keep even. Will add NS at 50cc/hr since GI losses are keeping her negative. Marland Kitchen  Hx atrial fib - eliquis on hold Anemia ckd - Hb 8's, will add darbe 40 ug weekly, check fe / tibc MBD ckd - Ca in  range, phos 4s, cont vdra IV tiw PAD - recent LLE revasc surgery w/ I&D of L foot wound in Dec 2022        Molly [name] 05/13/2021, 1:39 PM   Recent Labs  Lab 05/12/21 1523 05/13/21 0510 05/13/21 0746  K 5.0 4.3 4.3  BUN 33* 16  --   CREATININE 2.88* 1.39*  --   CALCIUM 7.3* 7.6*  --   PHOS 4.1 2.4*  --   HGB  --  8.7* 8.5*    Inpatient medications:  [START ON 05/24/2021] amantadine  200 mg Per Tube Weekly   artificial tears   Both Eyes Q8H   B-complex with vitamin C  1 tablet Per Tube Daily   chlorhexidine gluconate (MEDLINE KIT)  15 mL Mouth Rinse BID   Chlorhexidine Gluconate Cloth  6 each Topical Daily   Chlorhexidine Gluconate Cloth  6 each Topical Q0600   doxercalciferol  1 mcg Intravenous Q T,Th,Sat-1800   feeding supplement (PROSource TF)  90 mL Per Tube TID   fiber  1 packet Per Tube TID   heparin injection (subcutaneous)  5,000 Units Subcutaneous Q12H   insulin aspart  0-15 Units Subcutaneous Q4H   levETIRAcetam  750 mg Per Tube BID   mouth rinse  15 mL Mouth Rinse 10 times per day   pantoprazole sodium  40 mg Per Tube QHS     prismasol BGK 4/2.5 400 mL/hr at 05/13/21 0227    prismasol BGK 4/2.5 200 mL/hr at 05/12/21 1319   sodium chloride     feeding supplement (VITAL 1.5 CAL) 1,000 mL (05/12/21 1419)   norepinephrine (LEVOPHED) Adult infusion Stopped (05/13/21 0922)   piperacillin-tazobactam 3.375 g (05/13/21 1311)   prismasol BGK 4/2.5 1,800 mL/hr at 05/13/21 1222   sodium phosphate  Dextrose 5% IVPB     vasopressin Stopped (05/12/21 2301)   acetaminophen, alteplase, fentaNYL (SUBLIMAZE) injection, fentaNYL (SUBLIMAZE) injection, heparin, labetalol, midazolam, midazolam, naLOXone (NARCAN)  injection, ondansetron **OR** ondansetron (ZOFRAN) IV, sodium chloride

## 2021-05-13 NOTE — Progress Notes (Signed)
Patient ID: Molly [name], female   DOB: [birth year], 71 y.o.   MRN: [id number] Sedated, intubated Not responsive I spoke with Mr.Hyder at length again tonight. He continues his search for some meaning, with the events of this past week. I have told him that many things including Mrs. Kerby's hospitalization is a random event that we do not control, nor was the MI an expected event.  He wound like a test which can show when she might wake up. I explained that is a common question in our Unit but is unanswerable. No one knows, nor can know.  As of now the CRRT is vital for survival so mri is not feasible at this time.

## 2021-05-13 NOTE — Progress Notes (Signed)
PT Cancellation Note  Patient Details Name: Molly [name] MRN: [id number] DOB: 04-14-1951   Cancelled Treatment:    Reason Eval/Treat Not Completed: Patient not medically ready Patient intubated and sedated. PT will follow up on Monday.   Chaquetta Schlottman A. Gilford Rile PT, DPT Acute Rehabilitation Services Pager [phone] Office [phone]    Linna Hoff 05/13/2021, 11:59 AM

## 2021-05-14 DIAGNOSIS — S065XAA Traumatic subdural hemorrhage with loss of consciousness status unknown, initial encounter: Secondary | ICD-10-CM | POA: Diagnosis not present

## 2021-05-14 DIAGNOSIS — J9601 Acute respiratory failure with hypoxia: Secondary | ICD-10-CM | POA: Diagnosis not present

## 2021-05-14 DIAGNOSIS — A419 Sepsis, unspecified organism: Secondary | ICD-10-CM | POA: Diagnosis not present

## 2021-05-14 DIAGNOSIS — N186 End stage renal disease: Secondary | ICD-10-CM

## 2021-05-14 DIAGNOSIS — J690 Pneumonitis due to inhalation of food and vomit: Secondary | ICD-10-CM | POA: Diagnosis not present

## 2021-05-14 DIAGNOSIS — E43 Unspecified severe protein-calorie malnutrition: Secondary | ICD-10-CM

## 2021-05-14 LAB — POCT I-STAT 7, (LYTES, BLD GAS, ICA,H+H)
Acid-Base Excess: 1 mmol/L (ref 0.0–2.0)
Bicarbonate: 25.1 mmol/L (ref 20.0–28.0)
Calcium, Ion: 1.06 mmol/L — ABNORMAL LOW (ref 1.15–1.40)
HCT: 25 % — ABNORMAL LOW (ref 36.0–46.0)
Hemoglobin: 8.5 g/dL — ABNORMAL LOW (ref 12.0–15.0)
O2 Saturation: 99 %
Patient temperature: 96.8
Potassium: 4.1 mmol/L (ref 3.5–5.1)
Sodium: 140 mmol/L (ref 135–145)
TCO2: 26 mmol/L (ref 22–32)
pCO2 arterial: 33.1 mmHg (ref 32.0–48.0)
pH, Arterial: 7.483 — ABNORMAL HIGH (ref 7.350–7.450)
pO2, Arterial: 127 mmHg — ABNORMAL HIGH (ref 83.0–108.0)

## 2021-05-14 LAB — HEPATIC FUNCTION PANEL
ALT: 86 U/L — ABNORMAL HIGH (ref 0–44)
AST: 337 U/L — ABNORMAL HIGH (ref 15–41)
Albumin: 1.8 g/dL — ABNORMAL LOW (ref 3.5–5.0)
Alkaline Phosphatase: 97 U/L (ref 38–126)
Bilirubin, Direct: 0.3 mg/dL — ABNORMAL HIGH (ref 0.0–0.2)
Indirect Bilirubin: 0.8 mg/dL (ref 0.3–0.9)
Total Bilirubin: 1.1 mg/dL (ref 0.3–1.2)
Total Protein: 5.5 g/dL — ABNORMAL LOW (ref 6.5–8.1)

## 2021-05-14 LAB — CBC WITH DIFFERENTIAL/PLATELET
Abs Immature Granulocytes: 0 10*3/uL (ref 0.00–0.07)
Basophils Absolute: 0 10*3/uL (ref 0.0–0.1)
Basophils Relative: 0 %
Eosinophils Absolute: 0 10*3/uL (ref 0.0–0.5)
Eosinophils Relative: 0 %
HCT: 24.6 % — ABNORMAL LOW (ref 36.0–46.0)
Hemoglobin: 7.9 g/dL — ABNORMAL LOW (ref 12.0–15.0)
Lymphocytes Relative: 6 %
Lymphs Abs: 1.1 10*3/uL (ref 0.7–4.0)
MCH: 27.7 pg (ref 26.0–34.0)
MCHC: 32.1 g/dL (ref 30.0–36.0)
MCV: 86.3 fL (ref 80.0–100.0)
Monocytes Absolute: 1.1 10*3/uL — ABNORMAL HIGH (ref 0.1–1.0)
Monocytes Relative: 6 %
Neutro Abs: 15.6 10*3/uL — ABNORMAL HIGH (ref 1.7–7.7)
Neutrophils Relative %: 88 %
Platelets: 140 10*3/uL — ABNORMAL LOW (ref 150–400)
RBC: 2.85 MIL/uL — ABNORMAL LOW (ref 3.87–5.11)
RDW: 16.9 % — ABNORMAL HIGH (ref 11.5–15.5)
WBC: 17.7 10*3/uL — ABNORMAL HIGH (ref 4.0–10.5)
nRBC: 1.4 % — ABNORMAL HIGH (ref 0.0–0.2)
nRBC: 3 /100 WBC — ABNORMAL HIGH

## 2021-05-14 LAB — RENAL FUNCTION PANEL
Albumin: 1.8 g/dL — ABNORMAL LOW (ref 3.5–5.0)
Albumin: 1.8 g/dL — ABNORMAL LOW (ref 3.5–5.0)
Anion gap: 9 (ref 5–15)
Anion gap: 11 (ref 5–15)
BUN: 12 mg/dL (ref 8–23)
BUN: 14 mg/dL (ref 8–23)
CO2: 22 mmol/L (ref 22–32)
CO2: 23 mmol/L (ref 22–32)
Calcium: 7.3 mg/dL — ABNORMAL LOW (ref 8.9–10.3)
Calcium: 7.5 mg/dL — ABNORMAL LOW (ref 8.9–10.3)
Chloride: 107 mmol/L (ref 98–111)
Chloride: 106 mmol/L (ref 98–111)
Creatinine, Ser: 0.74 mg/dL (ref 0.44–1.00)
Creatinine, Ser: 0.73 mg/dL (ref 0.44–1.00)
GFR, Estimated: >60 mL/min (ref >60)
GFR, Estimated: >60 mL/min (ref >60)
Glucose, Bld: 170 mg/dL — ABNORMAL HIGH (ref 70–99)
Glucose, Bld: 185 mg/dL — ABNORMAL HIGH (ref 70–99)
Phosphorus: 2.1 mg/dL — ABNORMAL LOW (ref 2.5–4.6)
Phosphorus: 2.5 mg/dL (ref 2.5–4.6)
Potassium: 4.2 mmol/L (ref 3.5–5.1)
Potassium: 4.1 mmol/L (ref 3.5–5.1)
Sodium: 138 mmol/L (ref 135–145)
Sodium: 140 mmol/L (ref 135–145)

## 2021-05-14 LAB — CULTURE, RESPIRATORY W GRAM STAIN
Culture: NO GROWTH
Gram Stain: NONE SEEN

## 2021-05-14 LAB — GLUCOSE, CAPILLARY
Glucose-Capillary: 139 mg/dL — ABNORMAL HIGH (ref 70–99)
Glucose-Capillary: 155 mg/dL — ABNORMAL HIGH (ref 70–99)
Glucose-Capillary: 16 mg/dL — CL (ref 70–99)
Glucose-Capillary: 170 mg/dL — ABNORMAL HIGH (ref 70–99)
Glucose-Capillary: 179 mg/dL — ABNORMAL HIGH (ref 70–99)
Glucose-Capillary: 183 mg/dL — ABNORMAL HIGH (ref 70–99)
Glucose-Capillary: 37 mg/dL — CL (ref 70–99)
Glucose-Capillary: 79 mg/dL (ref 70–99)

## 2021-05-14 LAB — MAGNESIUM: Magnesium: 2.5 mg/dL — ABNORMAL HIGH (ref 1.7–2.4)

## 2021-05-14 LAB — APTT: aPTT: 53 seconds — ABNORMAL HIGH (ref 24–36)

## 2021-05-14 MED ORDER — DEXTROSE 50 % IV SOLN
INTRAVENOUS | Status: AC
  Administered 2021-05-14: 100 mL
  Filled 2021-05-14: qty 100

## 2021-05-14 MED ORDER — DEXTROSE 50 % IV SOLN
INTRAVENOUS | Status: AC
  Filled 2021-05-14: qty 50

## 2021-05-14 MED ORDER — DEXTROSE-NACL 5-0.9 % IV SOLN: INTRAVENOUS | Status: DC

## 2021-05-14 MED ORDER — SODIUM PHOSPHATES 45 MMOLE/15ML IV SOLN
15.0000 mmol | Freq: Once | INTRAVENOUS | Status: AC
  Administered 2021-05-14: 15 mmol via INTRAVENOUS
  Filled 2021-05-14: qty 5

## 2021-05-14 MED ORDER — DARBEPOETIN ALFA 40 MCG/0.4ML IJ SOSY: 40.0000 ug | PREFILLED_SYRINGE | INTRAMUSCULAR | Status: DC

## 2021-05-14 NOTE — Progress Notes (Signed)
Subjective: NAEs o/n  Objective: Vital signs in last 24 hours: Temp:  [95.2 F (35.1 C)-96.8 F (36 C)] 96.8 F (36 C) (01/28 0900) Pulse Rate:  [69-128] 82 (01/28 0900) Resp:  [19-38] 30 (01/28 0900) BP: (139-148)/(49) 139/49 (01/27 2307) SpO2:  [96 %-100 %] 100 % (01/28 0900) Arterial Line BP: (106-174)/(41-55) 154/53 (01/28 0900) FiO2 (%):  [40 %-50 %] 40 % (01/28 0800) Weight:  [56.9 kg] 56.9 kg (01/28 0500)  Intake/Output from previous day: 01/27 0701 - 01/28 0700 In: 2722.6 [I.V.:852.2; NG/GT:1411.8; IV Piggyback:458.6] Out: 3345 [Stool:2400] Intake/Output this shift: Total I/O In: 240 [I.V.:99.9; NG/GT:90; IV Piggyback:50] Out: 188 [Other:188] Eyes closed, intubated Pupils 2 mm, minimally reactive No movement to stim On CRRT  Lab Results: Recent Labs    05/13/21 0510 05/13/21 0746 05/14/21 0557 05/14/21 0844  WBC 9.1  --  17.7*  --   HGB 8.7*   < > 7.9* 8.5*  HCT 27.4*   < > 24.6* 25.0*  PLT 158  --  140*  --    < > = values in this interval not displayed.   BMET Recent Labs    05/13/21 1607 05/14/21 0557 05/14/21 0844  NA 139 138 140  K 4.0 4.2 4.1  CL 105 107  --   CO2 21* 22  --   GLUCOSE 141* 170*  --   BUN 13 12  --   CREATININE 1.01* 0.74  --   CALCIUM 7.5* 7.3*  --     Studies/Results: No results found.  Assessment/Plan: 71 yo F s/p R crani for acute-on-chronic SDH with postop , MI, respiratory and renal failure. - unclear how much of her encephalopathy is related to her multi-system organ failure - continue supportive care for now   Vallarie Mare 05/14/2021, 10:02 AM

## 2021-05-14 NOTE — Progress Notes (Signed)
NAME:  Molly [name], MRN:  [id number], DOB:  Feb 27, 1951, LOS: 7 ADMISSION DATE:  05/02/2021, CONSULTATION DATE:  04/30/2021 REFERRING MD:  Billy Fischer, CHIEF COMPLAINT:  loss of consciousness   History of Present Illness:  70yF with history of ESRD, PAD, chronic diastolic heart failure, OSA, on eliquis for ?AF per husband who was in her USOH until this morning fell and hit her head on a dresser. She felt ok enough to still go to dialysis but over the course of the day got more and more drowsy. Able to complete about an hour and a half of dialysis. Eventually became essentially unresponsive and husband called EMS.  In ED found to have traumatic SDH with midline shift, she was intubated and she was given 1g TXA, andexxanet.   She remains critically ill.  Pertinent  Medical History  ESRD PAD Chronic diastolic heart failure OSA Eliquis for ?AF  Significant Hospital Events: Including procedures, antibiotic start and stop dates in addition to other pertinent events   05/02/2021 intubated, TXA, andexxanet, to OR for decompression/evacuation of right acute subdural hematoma.  1/22 underwent craniotomy with hematoma evacuation with Dr. Arneta Cliche overnight  1/24 repeat CT shows right pneumocephalus and right hemispheric edema, midline shift has improved.  1/25 attempted extubation yesterday for improving mental status but failed and required reintubation for aspiration.  1/26 Failed attempt at extubation yesterday with aspiration event and resultant septic shock. PEA arrest last night. CRRT resumed. Interim History / Subjective:  No acute events overnight.  On CRRT Off levo this morning WBC up to 17 from 8. Vanco DC yesterday.  Objective   Blood pressure (!) 139/49, pulse 70, temperature (!) 96.8 F (36 C), resp. rate (!) 31, height 5\' 2"  (1.575 m), weight 56.9 kg, SpO2 100 %.    Vent Mode: PRVC FiO2 (%):  [40 %-50 %] 40 % Set Rate:  [24 bmp-30 bmp] 24 bmp Vt Set:  [400 mL] 400 mL PEEP:  [8  cmH20-10 cmH20] 8 cmH20 Plateau Pressure:  [25 cmH20-28 cmH20] 26 cmH20   Intake/Output Summary (Last 24 hours) at 05/14/2021 0827 Last data filed at 05/14/2021 0800 Gross per 24 hour  Intake 2768.55 ml  Output 3329 ml  Net -560.45 ml    Filed Weights   05/11/21 1200 05/13/21 0500 05/14/21 0500  Weight: 61.4 kg 59.3 kg 56.9 kg    Examination: General:  frail elderly appearing female in NAD Neuro:  GCS3, RASS -5. No cough/gag. Breathing over vent.  HEENT:  [place]/AT, No JVD noted, PERRL Cardiovascular:  RRR, no MRG Lungs:  Coarse bilateral breath sounds Abdomen:  Soft, non-distended Musculoskeletal:  No acute deformity Skin:  Intact, MMM   Ancillary tests personally reviewed:     Assessment & Plan:  Acute subdural hematoma-traumatic, post mechanical fall, on anticoagulation PTA ?Anoxic brain injury vs Acute metabolic encephalopathy Significant aspiration pneumonia represents major setback and may impact long-term recovery. 48 hours post arrest with continued GCS 3. No improvement with ABX or CRRT. Suspect secondary injury due to cardiac arrest - NSGY primary - Off norepi since last night - Keep off all sedation.   Acute hypoxemic/hypercapnic respiratory failure: secondary to AMS/SDH. Likely aspirated as well.  PEA Arrest, in hospital- suspect secondary to aspiration event/hypoxia, 7 minutes total CPR with ROSC. Septic shock, secondary to aspiration pneumonia from gastric secretions MRSA swab negative, levophed down to 1mcg, FiO2 down from 100% to 60% -Full vent support -VAP bundle -Hold sedation -RASS goal 0 -Vanc stopped, continue Zosyn (  day 3/5). WBC bumped overnight. Monitor.   CKD IV on HD (TTS) -Nephrology following -CRRT vs iHD now she is off pressors.   HX HTN HX Afib on eliquis On NE, in NSR -Holding home antihypertensives in the setting of shock -AC, if needed, per neuro.   Transaminitis, suspect secondary to shock/arrest - Trend hepatic function - avoid  hepatotoxins  Diarrhea 2L out, appears like tube feed, abd exam benign -RD consult > transition to osmolite   DNR/DNI Spoke with husband Ray at bedside. Explained that after 48 hours the patient remained completely unresponsive despite antibiotics and CRRT. Patient was f/c prior to arrest. Suspect 7 minute arrest has signigicantly impacted neurological function and secondary injury will limit recovery. Discussed that if her heart were to stop CPR would likely do more harm than good. Husband agreed to make DNR but to continue treating the treatable. -DNR orders placed. -Family prompted that we will continue to support her through the weekend and if no meaningful improvement by Monday we will need to re-address goals of care.   Best Practice (right click and "Reselect all SmartList Selections" daily)   Diet/type: Continue tube feeds via Cortrak DVT prophylaxis: heparin GI prophylaxis: PPI Lines: N/A Foley: Removed  Code Status:  full code Last date of multidisciplinary goals of care discussion: husband updated at bedside 1/27. See above.    Critical care time: 38 minutes    Georgann Housekeeper, AGACNP-BC  Pulmonary & Critical Care  See Amion for personal pager PCCM on call pager [phone] until 7pm. Please call Elink 7p-7a. [phone]  05/14/2021 8:30 AM

## 2021-05-14 NOTE — Progress Notes (Signed)
[facility] Progress Note  Presentation summary: 71 y.o. year-old w/ hx of DM2, HTN, ESRD on HD, IBS, PAD who fell striking her head on her nightstand in the morning pre HD. She went to dialysis then came home. Slowly at home her mental status began to deteriorate. Husband called 75 and pt taken to hospital. In ED R pupil was enlarged, head CT showed large SDH.  Pt was taken to OR by neurosurgery for craniotomy w/ SDH evacuation.   Subjective: levo gtt almost off. Pt is DNR.   Vitals:   05/14/21 1200 05/14/21 1300 05/14/21 1319 05/14/21 1400  BP:      Pulse: 68 80 74 76  Resp: (!) 32 (!) 33 17 20  Temp: (!) 96.8 F (36 C) (!) 97.2 F (36.2 C) 97.7 F (36.5 C) 98.1 F (36.7 C)  TempSrc:      SpO2: 100% 100% 100% 100%  Weight:      Height:        Exam: on vent ,sedated, facial bruising  no jvd  throat ett in place  Chest cta bilat and lat  Cor reg no RG  Abd soft ntnd no ascites   Ext no LE edema   Neuro on vent and sedated, not following commands  LUA AVF +bruit/ +RIJ TDC     Home meds include - eliquis 5 bid, coreg 25 bid, pletal, zetia, pepcid, prozac, hydralazine 25 bid, imdur 60, toprol xl 50 am, crestor, asa 81, prns/ vits/ supps    OP HD: [place] [facility]  4h  58.3kg  2/2.25 bath TDC using/ LUA AVF (needs revision still?)  Heparin NONE (was getting 2000 as OP)  - hectorol 1ug tiw     Assessment/ Plan: Acute traumatic SDH - w/ altered MS. SP craniotomy / SDH evacuation 1/21 by neurosurgery.  PEA arrest - 1/25 Anoxic encephalopathy - post arrest VDRF - per CCM Hypotension/ shock - off of pressor support ESRD - on HD TTS. Last iHD 1/24. CRRT started 1/26. Will dc CRRT as pt is off pressors now and no sig volume load from drips. Plan iHD early next week.   Volume- no sig edema, 1kg under dry wt today Hx atrial fib - eliquis on hold Anemia ckd - Hb 8's, will start darbe 40 ug weekly, check fe / tibc MBD ckd - Ca in range, phos 4s, cont vdra IV tiw PAD -  recent LLE revasc surgery w/ I&D of L foot wound in Dec 2022        Rob Kaveri Perras 05/14/2021, 2:16 PM   Recent Labs  Lab 05/13/21 1607 05/14/21 0557 05/14/21 0844  K 4.0 4.2 4.1  BUN 13 12  --   CREATININE 1.01* 0.74  --   CALCIUM 7.5* 7.3*  --   PHOS 3.1 2.1*  --   HGB  --  7.9* 8.5*    Inpatient medications:  [START ON 05/24/2021] amantadine  200 mg Per Tube Weekly   artificial tears   Both Eyes Q8H   B-complex with vitamin C  1 tablet Per Tube Daily   chlorhexidine gluconate (MEDLINE KIT)  15 mL Mouth Rinse BID   Chlorhexidine Gluconate Cloth  6 each Topical Daily   Chlorhexidine Gluconate Cloth  6 each Topical Q0600   doxercalciferol  1 mcg Intravenous Q T,Th,Sat-1800   feeding supplement (PROSource TF)  90 mL Per Tube TID   heparin injection (subcutaneous)  5,000 Units Subcutaneous Q12H   insulin aspart  0-15 Units Subcutaneous Q4H  mouth rinse  15 mL Mouth Rinse 10 times per day   pantoprazole sodium  40 mg Per Tube QHS     prismasol BGK 4/2.5 400 mL/hr at 05/14/21 0407    prismasol BGK 4/2.5 200 mL/hr at 05/13/21 1508   sodium chloride     sodium chloride 50 mL/hr at 05/14/21 1400   feeding supplement (OSMOLITE 1.5 CAL) 1,000 mL (05/14/21 1400)   piperacillin-tazobactam 3.375 g (05/14/21 1412)   prismasol BGK 4/2.5 1,800 mL/hr at 05/14/21 1312   sodium phosphate  Dextrose 5% IVPB 43 mL/hr at 05/14/21 1400   acetaminophen, alteplase, fentaNYL (SUBLIMAZE) injection, fentaNYL (SUBLIMAZE) injection, heparin, labetalol, naLOXone (NARCAN)  injection, ondansetron **OR** ondansetron (ZOFRAN) IV, sodium chloride

## 2021-05-14 NOTE — Progress Notes (Signed)
eLink Physician-Brief Progress Note Patient Name: Molly [name] DOB: 02-18-1951 MRN: [id number]   Date of Service  05/14/2021  HPI/Events of Note  Notified of hypoglycemia episodes 20s and 30s On TF 45 cc/hr Ongoing HD On NS at 50/hr Ma 140, K 4.2  eICU Interventions  Switch NS to D5NS Continue hypoglycemia protocol Discussed with bedside RN     Intervention Category Intermediate Interventions: Other:  Judd Lien 05/14/2021, 8:44 PM

## 2021-05-15 DIAGNOSIS — R0902 Hypoxemia: Secondary | ICD-10-CM | POA: Diagnosis not present

## 2021-05-15 DIAGNOSIS — J9601 Acute respiratory failure with hypoxia: Secondary | ICD-10-CM | POA: Diagnosis not present

## 2021-05-15 DIAGNOSIS — G931 Anoxic brain damage, not elsewhere classified: Secondary | ICD-10-CM

## 2021-05-15 DIAGNOSIS — Z7189 Other specified counseling: Secondary | ICD-10-CM

## 2021-05-15 DIAGNOSIS — S065XAA Traumatic subdural hemorrhage with loss of consciousness status unknown, initial encounter: Secondary | ICD-10-CM | POA: Diagnosis not present

## 2021-05-15 DIAGNOSIS — J690 Pneumonitis due to inhalation of food and vomit: Secondary | ICD-10-CM | POA: Diagnosis not present

## 2021-05-15 DIAGNOSIS — Z66 Do not resuscitate: Secondary | ICD-10-CM

## 2021-05-15 LAB — RENAL FUNCTION PANEL
Albumin: 1.7 g/dL — ABNORMAL LOW (ref 3.5–5.0)
Anion gap: 7 (ref 5–15)
BUN: 15 mg/dL (ref 8–23)
CO2: 24 mmol/L (ref 22–32)
Calcium: 7.3 mg/dL — ABNORMAL LOW (ref 8.9–10.3)
Chloride: 106 mmol/L (ref 98–111)
Creatinine, Ser: 0.66 mg/dL (ref 0.44–1.00)
GFR, Estimated: >60 mL/min (ref >60)
Glucose, Bld: 262 mg/dL — ABNORMAL HIGH (ref 70–99)
Phosphorus: 2.2 mg/dL — ABNORMAL LOW (ref 2.5–4.6)
Potassium: 4.2 mmol/L (ref 3.5–5.1)
Sodium: 137 mmol/L (ref 135–145)

## 2021-05-15 LAB — MAGNESIUM: Magnesium: 2.5 mg/dL — ABNORMAL HIGH (ref 1.7–2.4)

## 2021-05-15 LAB — IRON AND TIBC
Iron: 77 ug/dL (ref 28–170)
Saturation Ratios: 63 % — ABNORMAL HIGH (ref 10.4–31.8)
TIBC: 122 ug/dL — ABNORMAL LOW (ref 250–450)
UIBC: 45 ug/dL

## 2021-05-15 LAB — CBC WITH DIFFERENTIAL/PLATELET
Abs Immature Granulocytes: 0 10*3/uL (ref 0.00–0.07)
Basophils Absolute: 0 10*3/uL (ref 0.0–0.1)
Basophils Relative: 0 %
Eosinophils Absolute: 0 10*3/uL (ref 0.0–0.5)
Eosinophils Relative: 0 %
HCT: 25.6 % — ABNORMAL LOW (ref 36.0–46.0)
Hemoglobin: 8.3 g/dL — ABNORMAL LOW (ref 12.0–15.0)
Lymphocytes Relative: 10 %
Lymphs Abs: 2.2 10*3/uL (ref 0.7–4.0)
MCH: 27.7 pg (ref 26.0–34.0)
MCHC: 32.4 g/dL (ref 30.0–36.0)
MCV: 85.3 fL (ref 80.0–100.0)
Monocytes Absolute: 1.3 10*3/uL — ABNORMAL HIGH (ref 0.1–1.0)
Monocytes Relative: 6 %
Neutro Abs: 18.7 10*3/uL — ABNORMAL HIGH (ref 1.7–7.7)
Neutrophils Relative %: 84 %
Platelets: 136 10*3/uL — ABNORMAL LOW (ref 150–400)
RBC: 3 MIL/uL — ABNORMAL LOW (ref 3.87–5.11)
RDW: 17.1 % — ABNORMAL HIGH (ref 11.5–15.5)
WBC: 22.3 10*3/uL — ABNORMAL HIGH (ref 4.0–10.5)
nRBC: 1.8 % — ABNORMAL HIGH (ref 0.0–0.2)
nRBC: 4 /100 WBC — ABNORMAL HIGH

## 2021-05-15 LAB — HEPATIC FUNCTION PANEL
ALT: 73 U/L — ABNORMAL HIGH (ref 0–44)
AST: 238 U/L — ABNORMAL HIGH (ref 15–41)
Albumin: 1.7 g/dL — ABNORMAL LOW (ref 3.5–5.0)
Alkaline Phosphatase: 135 U/L — ABNORMAL HIGH (ref 38–126)
Bilirubin, Direct: 0.3 mg/dL — ABNORMAL HIGH (ref 0.0–0.2)
Indirect Bilirubin: 0.5 mg/dL (ref 0.3–0.9)
Total Bilirubin: 0.8 mg/dL (ref 0.3–1.2)
Total Protein: 5.7 g/dL — ABNORMAL LOW (ref 6.5–8.1)

## 2021-05-15 LAB — GLUCOSE, CAPILLARY
Glucose-Capillary: 77 mg/dL (ref 70–99)
Glucose-Capillary: 189 mg/dL — ABNORMAL HIGH (ref 70–99)
Glucose-Capillary: 287 mg/dL — ABNORMAL HIGH (ref 70–99)
Glucose-Capillary: 200 mg/dL — ABNORMAL HIGH (ref 70–99)
Glucose-Capillary: 260 mg/dL — ABNORMAL HIGH (ref 70–99)
Glucose-Capillary: 208 mg/dL — ABNORMAL HIGH (ref 70–99)

## 2021-05-15 MED ORDER — DARBEPOETIN ALFA 40 MCG/0.4ML IJ SOSY
40.0000 ug | PREFILLED_SYRINGE | INTRAMUSCULAR | Status: DC
  Filled 2021-05-15: qty 0.4

## 2021-05-15 MED ORDER — PIPERACILLIN-TAZOBACTAM IN DEX 2-0.25 GM/50ML IV SOLN
2.2500 g | Freq: Three times a day (TID) | INTRAVENOUS | Status: DC
  Administered 2021-05-15 – 2021-05-16 (×3): 2.25 g via INTRAVENOUS
  Filled 2021-05-15 (×6): qty 50

## 2021-05-15 MED ORDER — DEXTROSE 5 % IV SOLN: INTRAVENOUS | Status: DC

## 2021-05-15 MED ORDER — DARBEPOETIN ALFA 40 MCG/0.4ML IJ SOSY: 40.0000 ug | PREFILLED_SYRINGE | INTRAMUSCULAR | Status: DC

## 2021-05-15 NOTE — Progress Notes (Signed)
NAME:  Molly [name], MRN:  [id number], DOB:  February 23, 1951, LOS: 8 ADMISSION DATE:  05/01/2021, CONSULTATION DATE:  04/19/2021 REFERRING MD:  Billy Fischer, CHIEF COMPLAINT:  loss of consciousness   History of Present Illness:  70yF with history of ESRD, PAD, chronic diastolic heart failure, OSA, on eliquis for ?AF per husband who was in her USOH until this morning fell and hit her head on a dresser. She felt ok enough to still go to dialysis but over the course of the day got more and more drowsy. Able to complete about an hour and a half of dialysis. Eventually became essentially unresponsive and husband called EMS.  In ED found to have traumatic SDH with midline shift, she was intubated and she was given 1g TXA, andexxanet.   She remains critically ill.  Pertinent  Medical History  ESRD PAD Chronic diastolic heart failure OSA Eliquis for ?AF  Significant Hospital Events: Including procedures, antibiotic start and stop dates in addition to other pertinent events   04/29/2021 intubated, TXA, andexxanet, to OR for decompression/evacuation of right acute subdural hematoma.  1/22 underwent craniotomy with hematoma evacuation with Dr. Arneta Cliche overnight  1/24 repeat CT shows right pneumocephalus and right hemispheric edema, midline shift has improved.  1/25 attempted extubation yesterday for improving mental status but failed and required reintubation for aspiration.  1/26 Failed attempt at extubation yesterday with aspiration event and resultant septic shock. PEA arrest last night. CRRT resumed. 1/29 Remains on CRRT this am with no major events overnight   Interim History / Subjective:  No acute issues overnight  Husband at bedside and updated   Objective   Blood pressure (!) 143/66, pulse 63, temperature (!) 97 F (36.1 C), resp. rate (!) 31, height 5\' 2"  (1.575 m), weight 56.9 kg, SpO2 100 %.    Vent Mode: PRVC FiO2 (%):  [40 %] 40 % Set Rate:  [24 bmp] 24 bmp Vt Set:  [400 mL] 400  mL PEEP:  [5 cmH20] 5 cmH20 Plateau Pressure:  [12 cmH20-18 cmH20] 18 cmH20   Intake/Output Summary (Last 24 hours) at 05/15/2021 0751 Last data filed at 05/15/2021 0700 Gross per 24 hour  Intake 3227.42 ml  Output 3428 ml  Net -200.58 ml    Filed Weights   05/11/21 1200 05/13/21 0500 05/14/21 0500  Weight: 61.4 kg 59.3 kg 56.9 kg    Examination: General: Acute on chronic ill appearing elderly female lying in bed on mechanical ventilation, in NAD HEENT: ETT, MM pink/moist, PERRL, right facial bruising  Neuro: Minimally responsive on vent CV: s1s2 regular rate and rhythm, no murmur, rubs, or gallops,  PULM:  Clear to ascultation, no increased work of breathing, no added breath sounds  GI: soft, bowel sounds active in all 4 quadrants, non-tender, non-distended, tolerating TF Extremities: warm/dry, no edema  Skin: no rashes or lesions  Ancillary tests personally reviewed:     Assessment & Plan:  Acute subdural hematoma-traumatic, post mechanical fall,  -on anticoagulation PTA ?Anoxic brain injury vs Acute metabolic encephalopathy -Significant aspiration pneumonia represents major setback and may impact long-term recovery. 48 hours post arrest with continued GCS 3. No improvement with ABX or CRRT. Suspect secondary injury due to cardiac arrest P: Continue supportive care  Ongoing GOC discussion  Minimize sedation  Maintain neuro protective measures; goal for eurothermia, euglycemia, eunatermia, normoxia, and PCO2 goal of 35-40 Nutrition and bowel regiment  Seizure precautions  Aspirations precautions   Acute hypoxemic/hypercapnic respiratory failure:  -Secondary to AMS/SDH. Likely  aspirated as well.  PEA Arrest, in hospital -Suspect secondary to aspiration event/hypoxia, 7 minutes total CPR with ROSC. Septic shock, secondary to aspiration pneumonia from gastric secretions -MRSA swab negative, P: Continue ventilator support with lung protective strategies  Wean PEEP and  FiO2 for sats greater than 90%. Head of bed elevated 30 degrees. Plateau pressures less than 30 cm H20.  Follow intermittent chest x-ray and ABG.   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 He would like to allow other family time to visit.   Best Practice (right click and "Reselect all SmartList Selections" daily)   Diet/type: Continue tube feeds via Cortrak DVT prophylaxis: heparin GI prophylaxis: PPI Lines: N/A Foley: Removed  Code Status:  full code Last date of multidisciplinary goals of care discussion: husband updated at bedside 1/27. See above.    Critical care time:    Performed by: Nelle Sayed D. Harris  Total critical care time: 38 minutes  Critical care time was exclusive of separately billable procedures and treating other patients.  Critical care was necessary to treat or prevent imminent or life-threatening deterioration.  Critical care was time spent personally by me on the following activities: development of treatment plan with patient and/or surrogate as well as nursing, discussions with consultants, evaluation of patient's response to treatment, examination of patient, obtaining history from patient or surrogate, ordering and performing treatments and  interventions, ordering and review of laboratory studies, ordering and review of radiographic studies, pulse oximetry and re-evaluation of patient's condition.  Eddy Liszewski D. Kenton Kingfisher, NP-C [facility] Personal contact information can be found on Amion  05/15/2021, 7:59 AM

## 2021-05-15 NOTE — Progress Notes (Addendum)
Pharmacy Antibiotic Note  Molly [name] is a 71 y.o. female admitted on 05/06/2021 with pneumonia.  Pharmacy has been consulted for Zosyn dosing.   ID: asp PNA/sepsis - HYPOthermic on CRRT, WBC 22.3 up  Unasyn 1/25 >> 1/25 Zosyn 1/25 >> Vanc 1/25 >> 1/27  1/21 MRSA PCR - negative 1/25 TA - neg   Plan: Zosyn 3.375gm IV Q6H #4/5.>> chg to 2.25g IV q8hr CRRT>stopped Aranesp q Saturday (give 1/29, missed yesterday). HOLD today pending family decisions    Height: 5\' 2"  (157.5 cm) Weight: 56.9 kg (125 lb 7.1 oz) IBW/kg (Calculated) : 50.1  Temp (24hrs), Avg:97.5 F (36.4 C), Min:96.6 F (35.9 C), Max:98.4 F (36.9 C)  Recent Labs  Lab 05/11/21 2012 05/12/21 0117 05/12/21 0818 05/12/21 1523 05/13/21 0510 05/13/21 1607 05/14/21 0557 05/14/21 1517 05/15/21 0434  WBC 4.3  --  6.3  --  9.1  --  17.7*  --  22.3*  CREATININE 3.59*  --  3.69*   < > 1.39* 1.01* 0.74 0.73 0.66  LATICACIDVEN 5.6* 2.7*  --   --   --   --   --   --   --    < > = values in this interval not displayed.    Estimated Creatinine Clearance: 53.2 mL/min (by C-G formula based on SCr of 0.66 mg/dL).    Allergies  Allergen Reactions   Mercury Anaphylaxis    Patsy Zaragoza S. Alford Highland, PharmD, BCPS Clinical Staff Pharmacist [email or web address]  Wayland Salinas 05/15/2021 8:43 AM

## 2021-05-15 NOTE — Progress Notes (Signed)
Subjective: CRRT turned off  Objective: Vital signs in last 24 hours: Temp:  [96.6 F (35.9 C)-98.6 F (37 C)] 98.6 F (37 C) (01/29 0900) Pulse Rate:  [54-81] 61 (01/29 0900) Resp:  [14-33] 14 (01/29 0900) BP: (116-152)/(48-67) 116/48 (01/29 0900) SpO2:  [100 %] 100 % (01/29 0900) Arterial Line BP: (125-176)/(35-65) 136/39 (01/29 0900) FiO2 (%):  [40 %] 40 % (01/29 0800)  Intake/Output from previous day: 01/28 0701 - 01/29 0700 In: 3227.4 [I.V.:1232.5; NG/GT:1530; IV Piggyback:464.9] Out: 7741 [Stool:1650] Intake/Output this shift: Total I/O In: 403.3 [I.V.:88.3; NG/GT:265; IV Piggyback:49.9] Out: 214 [Other:89; Stool:125]  Eyes closed, pupils 2 mm minimally reactive Weak withdrawal to stim Stapled Incision c/d  Lab Results: Recent Labs    05/14/21 0557 05/14/21 0844 05/15/21 0434  WBC 17.7*  --  22.3*  HGB 7.9* 8.5* 8.3*  HCT 24.6* 25.0* 25.6*  PLT 140*  --  136*   BMET Recent Labs    05/14/21 1517 05/15/21 0434  NA 140 137  K 4.1 4.2  CL 106 106  CO2 23 24  GLUCOSE 185* 262*  BUN 14 15  CREATININE 0.73 0.66  CALCIUM 7.5* 7.3*    Studies/Results: No results found.  Assessment/Plan: 71 yo F s/p R crani for SDH - patient may transition to comfort care tomorrow given significant uncertainty of functional neurologic recovery and likelihood of protracted difficult medical course.  Continue supportive care for now   Molly [name] 05/15/2021, 10:03 AM

## 2021-05-15 NOTE — Progress Notes (Signed)
[facility] Progress Note  Presentation summary: 71 y.o. year-old w/ hx of DM2, HTN, ESRD on HD, IBS, PAD who fell striking her head on her nightstand in the morning pre HD. She went to dialysis then came home. Slowly at home her mental status began to deteriorate. Husband called 73 and pt taken to hospital. In ED R pupil was enlarged, head CT showed large SDH.  Pt was taken to OR by neurosurgery for craniotomy w/ SDH evacuation.   Subjective: off pressors x 24 hrs   Vitals:   05/15/21 0700 05/15/21 0719 05/15/21 0800 05/15/21 0900  BP: (!) 143/66  (!) 152/54 (!) 116/48  Pulse: 63  64 61  Resp: (!) 30 (!) 31 20 14   Temp: (!) 97 F (36.1 C)  (!) 96.8 F (36 C) 98.6 F (37 C)  TempSrc:   Esophageal   SpO2: 100% 100% 100% 100%  Weight:      Height:        Exam: on vent ,sedated, facial bruising  no jvd  throat ett in place  Chest cta bilat and lat  Cor reg no RG  Abd soft ntnd no ascites   Ext no LE edema   Neuro on vent and sedated, not following commands  LUA AVF +bruit/ +RIJ TDC     Home meds include - eliquis 5 bid, coreg 25 bid, pletal, zetia, pepcid, prozac, hydralazine 25 bid, imdur 60, toprol xl 50 am, crestor, asa 81, prns/ vits/ supps    OP HD: [place] TTS  4h  58.3kg  2/2.25 bath TDC using/ LUA AVF (needs revision still?)  Heparin NONE (was getting 2000 as OP)  - hectorol 1ug tiw     Assessment/ Plan: Acute traumatic SDH - w/ altered MS. SP craniotomy / SDH evacuation 1/21 by neurosurgery.  PEA arrest - 1/25 Anoxic encephalopathy - post arrest VDRF - per CCM Hypotension/ shock - resolved, off of pressor support x 24 hrs ESRD - on HD TTS. Last iHD 1/24. CRRT started 1/26, will dc today. Family visiting w/ plan to move to comfort care tomorrow.  Volume- no sig edema, at dry wt Hx atrial fib - eliquis on hold Anemia ckd - Hb 8's, follow MBD ckd - Ca in range, phos 4s, cont vdra  PAD - recent LLE revasc surgery w/ I&D of L foot wound in Dec  2022        Rob Isabel Ardila 05/15/2021, 10:58 AM   Recent Labs  Lab 05/14/21 0844 05/14/21 1517 05/15/21 0434  K 4.1 4.1 4.2  BUN  --  14 15  CREATININE  --  0.73 0.66  CALCIUM  --  7.5* 7.3*  PHOS  --  2.5 2.2*  HGB 8.5*  --  8.3*    Inpatient medications:  [START ON 05/24/2021] amantadine  200 mg Per Tube Weekly   artificial tears   Both Eyes Q8H   B-complex with vitamin C  1 tablet Per Tube Daily   chlorhexidine gluconate (MEDLINE KIT)  15 mL Mouth Rinse BID   Chlorhexidine Gluconate Cloth  6 each Topical Daily   Chlorhexidine Gluconate Cloth  6 each Topical Q0600   [START ON 05/18/2021] darbepoetin (ARANESP) injection - DIALYSIS  40 mcg Intravenous Q Wed-HD   doxercalciferol  1 mcg Intravenous Q T,Th,Sat-1800   feeding supplement (PROSource TF)  90 mL Per Tube TID   heparin injection (subcutaneous)  5,000 Units Subcutaneous Q12H   insulin aspart  0-15 Units Subcutaneous Q4H   mouth  rinse  15 mL Mouth Rinse 10 times per day   pantoprazole sodium  40 mg Per Tube QHS    sodium chloride     feeding supplement (OSMOLITE 1.5 CAL) 1,000 mL (05/14/21 1400)   piperacillin-tazobactam (ZOSYN)  IV     acetaminophen, fentaNYL (SUBLIMAZE) injection, fentaNYL (SUBLIMAZE) injection, labetalol, naLOXone (NARCAN)  injection, ondansetron **OR** ondansetron (ZOFRAN) IV

## 2021-05-16 DIAGNOSIS — S065XAA Traumatic subdural hemorrhage with loss of consciousness status unknown, initial encounter: Secondary | ICD-10-CM | POA: Diagnosis not present

## 2021-05-16 LAB — CBC WITH DIFFERENTIAL/PLATELET
Abs Immature Granulocytes: 0.4 10*3/uL — ABNORMAL HIGH (ref 0.00–0.07)
Basophils Absolute: 0 10*3/uL (ref 0.0–0.1)
Basophils Relative: 0 %
Eosinophils Absolute: 0 10*3/uL (ref 0.0–0.5)
Eosinophils Relative: 0 %
HCT: 25.4 % — ABNORMAL LOW (ref 36.0–46.0)
Hemoglobin: 7.9 g/dL — ABNORMAL LOW (ref 12.0–15.0)
Lymphocytes Relative: 8 %
Lymphs Abs: 1.7 10*3/uL (ref 0.7–4.0)
MCH: 27.5 pg (ref 26.0–34.0)
MCHC: 31.1 g/dL (ref 30.0–36.0)
MCV: 88.5 fL (ref 80.0–100.0)
Metamyelocytes Relative: 2 %
Monocytes Absolute: 0.6 10*3/uL (ref 0.1–1.0)
Monocytes Relative: 3 %
Neutro Abs: 18.5 10*3/uL — ABNORMAL HIGH (ref 1.7–7.7)
Neutrophils Relative %: 87 %
Platelets: 121 10*3/uL — ABNORMAL LOW (ref 150–400)
RBC: 2.87 MIL/uL — ABNORMAL LOW (ref 3.87–5.11)
RDW: 17.5 % — ABNORMAL HIGH (ref 11.5–15.5)
Smear Review: NORMAL
WBC: 21.3 10*3/uL — ABNORMAL HIGH (ref 4.0–10.5)
nRBC: 2.1 % — ABNORMAL HIGH (ref 0.0–0.2)

## 2021-05-16 LAB — GLUCOSE, CAPILLARY
Glucose-Capillary: 131 mg/dL — ABNORMAL HIGH (ref 70–99)
Glucose-Capillary: 231 mg/dL — ABNORMAL HIGH (ref 70–99)
Glucose-Capillary: 235 mg/dL — ABNORMAL HIGH (ref 70–99)
Glucose-Capillary: 268 mg/dL — ABNORMAL HIGH (ref 70–99)
Glucose-Capillary: 321 mg/dL — ABNORMAL HIGH (ref 70–99)
Glucose-Capillary: <10 mg/dL — CL (ref 70–99)

## 2021-05-16 MED ORDER — GLYCOPYRROLATE 0.2 MG/ML IJ SOLN
0.2000 mg | INTRAMUSCULAR | Status: DC | PRN
  Administered 2021-05-16: 0.2 mg via INTRAVENOUS
  Filled 2021-05-16: qty 1

## 2021-05-16 MED ORDER — MORPHINE 100MG IN NS 100ML (1MG/ML) PREMIX INFUSION
0.0000 mg/h | INTRAVENOUS | Status: DC
  Administered 2021-05-16: 5 mg/h via INTRAVENOUS
  Filled 2021-05-16: qty 100

## 2021-05-16 MED ORDER — GLYCOPYRROLATE 0.2 MG/ML IJ SOLN: 0.2000 mg | INTRAMUSCULAR | Status: DC | PRN

## 2021-05-16 MED ORDER — MORPHINE SULFATE (PF) 2 MG/ML IV SOLN: 2.0000 mg | INTRAVENOUS | Status: DC | PRN

## 2021-05-16 MED ORDER — POLYVINYL ALCOHOL 1.4 % OP SOLN: 1.0000 [drp] | Freq: Four times a day (QID) | OPHTHALMIC | Status: DC | PRN

## 2021-05-16 MED ORDER — MORPHINE BOLUS VIA INFUSION
5.0000 mg | INTRAVENOUS | Status: DC | PRN
  Filled 2021-05-16: qty 5

## 2021-05-16 MED ORDER — LORAZEPAM 2 MG/ML IJ SOLN
2.0000 mg | INTRAMUSCULAR | Status: DC | PRN
  Administered 2021-05-16: 2 mg via INTRAVENOUS
  Filled 2021-05-16: qty 1

## 2021-05-16 MED ORDER — DIPHENHYDRAMINE HCL 50 MG/ML IJ SOLN: 25.0000 mg | INTRAMUSCULAR | Status: DC | PRN

## 2021-05-16 MED ORDER — GLYCOPYRROLATE 1 MG PO TABS
1.0000 mg | ORAL_TABLET | ORAL | Status: DC | PRN
  Filled 2021-05-16: qty 1

## 2021-05-16 NOTE — Progress Notes (Signed)
5 mg bolus of morphine gtt given for extubation.

## 2021-05-16 NOTE — Progress Notes (Signed)
PT Cancellation Note  Patient Details Name: Molly [name] MRN: [id number] DOB: 02/21/1951   Cancelled Treatment:    Reason Eval/Treat Not Completed: Medical issues which prohibited therapy; patient now with supportive care only likely for transition to comfort measures.  PT will sign off.    Reginia Naas 05/16/2021, 10:43 AM Magda Kiel, PT Acute Rehabilitation Services WNIOE:[phone] Office:[phone] 05/16/2021

## 2021-05-16 NOTE — Progress Notes (Signed)
Pt's husband, friend, stepson at bedside. Pt's brother on speaker phone. Family ready for termination of life support. Pt appears comfortable on morphine gtt.

## 2021-05-16 NOTE — Progress Notes (Signed)
°   05/16/21 1100  Clinical Encounter Type  Visited With Health care provider  Visit Type Follow-up  Referral From Nurse  Consult/Referral To None   Chaplain responded to request for a priest to visit with patient.  Arrangements are made with priest, Father Barnabas Lister, he will arrive mid afternoon today, 16 May 2021. Stopped by another additional time to visit with family member but he had not returned.   Mar[place] Resident [facility] [phone]

## 2021-05-16 NOTE — Progress Notes (Signed)
Pt resting, no signs of struggle. Family at bedside.

## 2021-05-16 NOTE — Progress Notes (Signed)
RT NOTE: RT extubated patient to comfort care per order and family wishes. RN and family are at bedside with patient.

## 2021-05-16 NOTE — Progress Notes (Signed)
Patient ID: KATHIA COVINGTON, female   DOB: 1951-02-14, 71 y.o.   MRN: [id number] BP (!) 113/52    Pulse 92    Temp (!) 100.9 F (38.3 C)    Resp (!) 0    Ht 5\' 2"  (1.575 m)    Wt 56.6 kg    SpO2 100%    BMI 22.82 kg/m  Mrs. Dower has been removed from CRRT, and has been extubated. She is now on comfort care only. I agree with the family decisions

## 2021-05-16 NOTE — Assessment & Plan Note (Signed)
Plan:  - On Zosyn

## 2021-05-16 NOTE — Progress Notes (Signed)
OT Cancellation Note  Patient Details Name: Molly [name] MRN: [id number] DOB: 07-08-1950   Cancelled Treatment:    Reason Eval/Treat Not Completed: Other (comment) (Pt likely transitioning to comfort. will sign off at this time.)  [facility], OT/L   Acute OT Clinical Specialist [place] Pager [phone] Office [phone]  05/16/2021, 1:17 PM

## 2021-05-16 NOTE — Assessment & Plan Note (Signed)
Continued ventilator dependency. On minimal settings.  Plan: - Mental status remains principal barrier to safe extubation.

## 2021-05-16 NOTE — Progress Notes (Signed)
Patient ID: ASUNA PETH, female   DOB: 24-Dec-1950, 71 y.o.   MRN: [id number] Plans noted for likely terminal extubation later today after Mrs.Campau's sister can visit with her this afternoon. CRRT stopped yesterday after discussions between providers and Mr.Crall regarding the extent of neurological injury and the unlikely chance of meaningful recovery.  Renal service will follow the course of care and provide support as needed.  Elmarie Shiley MD [facility]. Office # [phone] Pager # [phone] 9:40 AM

## 2021-05-16 NOTE — Progress Notes (Signed)
NAME:  Molly [name], MRN:  [id number], DOB:  01/27/1951, LOS: 9 ADMISSION DATE:  05/01/2021, CONSULTATION DATE:  04/29/2021 REFERRING MD:  Billy Fischer, CHIEF COMPLAINT:  loss of consciousness   History of Present Illness:  70yF with history of ESRD, PAD, chronic diastolic heart failure, OSA, on eliquis for ?AF per husband who was in her USOH until this morning fell and hit her head on a dresser. She felt ok enough to still go to dialysis but over the course of the day got more and more drowsy. Able to complete about an hour and a half of dialysis. Eventually became essentially unresponsive and husband called EMS.  In ED found to have traumatic SDH with midline shift, she was intubated and she was given 1g TXA, andexxanet.   She remains critically ill.  Pertinent  Medical History  ESRD PAD Chronic diastolic heart failure OSA Eliquis for ?AF  Significant Hospital Events: Including procedures, antibiotic start and stop dates in addition to other pertinent events   05/05/2021 intubated, TXA, andexxanet, to OR for decompression/evacuation of right acute subdural hematoma.  1/22 underwent craniotomy with hematoma evacuation with Dr. Arneta Cliche overnight  1/24 repeat CT shows right pneumocephalus and right hemispheric edema, midline shift has improved.  1/25 attempted extubation yesterday for improving mental status but failed and required reintubation for aspiration.  1/26 Failed attempt at extubation yesterday with aspiration event and resultant septic shock. PEA arrest last night. CRRT resumed. 1/29 Remains on CRRT this am with no major events overnight.  CRRT stopped   Interim History / Subjective:  No events. Husband wants to proceed with one-way extubation around 1730 when all of family and priest are here.   Objective   Blood pressure (!) 143/52, pulse 81, temperature 100.2 F (37.9 C), resp. rate (!) 27, height 5\' 2"  (1.575 m), weight 56.6 kg, SpO2 97 %.    Vent Mode: PRVC FiO2 (%):   [40 %] 40 % Set Rate:  [24 bmp] 24 bmp Vt Set:  [400 mL] 400 mL PEEP:  [5 cmH20] 5 cmH20 Plateau Pressure:  [16 cmH20-22 cmH20] 18 cmH20   Intake/Output Summary (Last 24 hours) at 05/16/2021 1440 Last data filed at 05/16/2021 0600 Gross per 24 hour  Intake 880 ml  Output 800 ml  Net 80 ml   Filed Weights   05/13/21 0500 05/14/21 0500 05/16/21 0500  Weight: 59.3 kg 56.9 kg 56.6 kg    Examination: General:  Acute on chronically ill female lying in bed in NAD HEENT: ETT/ cortrak, right sided facial bruising Neuro:  unresponsive  CV: rr PULM:  non labored on full support, flipped to PSV 12/5 and she is tachypneic and low TV   Ancillary tests personally reviewed:     Assessment & Plan:  Acute subdural hematoma-traumatic, post mechanical fall,  -on anticoagulation PTA ?Anoxic brain injury vs Acute metabolic encephalopathy Acute hypoxemic/hypercapnic respiratory failure:  -Secondary to AMS/SDH. Likely aspirated as well.  PEA Arrest, in hospital -Suspect secondary to aspiration event/hypoxia, 7 minutes total CPR with ROSC. 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  Awaiting family and priest so she may have last rite of prayer.  Expected time around 1730.  Comfort measures and one-way extubation explained to husband at bedside.  We will go ahead and start morphine gtt for  comfort and have ativan, robinul and benadryl available prn.  Ongoing family support.   Best Practice (right click and "Reselect all SmartList Selections" daily)   Diet/type: Continue tube feeds via Cortrak DVT prophylaxis: heparin GI prophylaxis: PPI Lines: N/A Foley: Removed  Code Status:  DNR Last date of multidisciplinary goals of care discussion:  see above   Critical care time: 30 mins    Kennieth Rad, ACNP  Pulmonary & Critical Care 05/16/2021, 2:40 PM

## 2021-05-16 NOTE — Assessment & Plan Note (Signed)
Poor neurological recovery.   Plan: - Family has opted for transition to comfort care today

## 2021-05-16 NOTE — Progress Notes (Signed)
°   05/16/21 0800  Clinical Encounter Type  Visited With Health care provider  Visit Type Initial  Referral From Nurse     [facility] received page from RN requesting on behalf of family for [place] priest to come and provide Anointing of the [place] today as plan is to transition pt. to comfort care later this afternoon.  [place] will make referral to daytime chaplains for follow-up.

## 2021-05-18 ENCOUNTER — Ambulatory Visit: Payer: Medicare Other | Admitting: Podiatry

## 2021-05-18 DEATH — deceased

## 2021-05-20 MED FILL — Medication: Qty: 1 | Status: AC

## 2021-06-06 ENCOUNTER — Ambulatory Visit: Payer: Medicare Other | Admitting: Cardiovascular Disease

## 2021-06-15 NOTE — Discharge Summary (Signed)
Physician Discharge Summary  Patient ID: Molly [name] MRN: [id number] DOB/AGE: 1950/09/16 71 y.o.  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 was taken emergently to the OR for a craniotomy and subdural hematoma evacuation. Post op she slowly improved and at one point was following commands. She then had a cardiac arrest which led to multisystem organ failure which she was not able to overcome. She did not regain consciousness.   Treatments: surgery: craniotomy for subdural hematoma evacuation CRRT for kidney failure Code blue resuscitation  Discharge Exam: Blood pressure (!) 112/46, pulse 95, temperature (!) 100.9 F (38.3 C), resp. rate 11, height 5\' 2"  (1.575 m), weight 56.6 kg, SpO2 (!) 76 %. deceased  Disposition:  acute subdural hematoma  Allergies as of 06/10/2021       Reactions   Mercury Anaphylaxis   Lisinopril Other (See Comments)   Hair fell out   Mercury Hives, Nausea And Vomiting        Medication List     ASK your doctor about these medications    acetaminophen 325 MG tablet Commonly known as: TYLENOL Take 650 mg by mouth every 4 (four) hours as needed for headache.   apixaban 5 MG Tabs tablet Commonly known as: ELIQUIS Take 5 mg by mouth 2 (two) times daily.   aspirin EC 81 MG tablet Take 81 mg by mouth daily at 6 (six) AM. Swallow whole.   carvedilol 25 MG tablet Commonly known as: COREG Take 25 mg by mouth 2 (two) times daily.   cilostazol 50 MG tablet Commonly known as: PLETAL Take 50 mg by mouth 2 (two) times daily.   ezetimibe 10 MG tablet Commonly known as: ZETIA Take 10  mg by mouth every morning.   famotidine 20 MG tablet Commonly known as: PEPCID Take 20 mg by mouth 2 (two) times daily as needed for heartburn or indigestion.   FLUoxetine 10 MG capsule Commonly known as: PROZAC Take 10 mg by mouth every morning.   hydrALAZINE 25 MG tablet Commonly known as: APRESOLINE Take 25 mg by mouth every 12 (twelve) hours.   hydrocortisone cream 1 % Apply 1 application topically 4 (four) times daily as needed for itching.   isosorbide mononitrate 60 MG 24 hr tablet Commonly known as: IMDUR Take 60 mg by mouth every morning.   ketoconazole 2 % cream Commonly known as: NIZORAL Apply 1 application topically daily as needed for irritation.   levocetirizine 5 MG tablet Commonly known as: XYZAL Take 5 mg by mouth daily as needed for allergies.   loperamide 1 MG/5ML solution Commonly known as: IMODIUM Take 2 mg by mouth as needed for diarrhea or loose stools.   OPTIVE 0.5-0.9 % ophthalmic solution Generic drug: carboxymethylcellul-glycerin Place 1 drop into both eyes 3 (three) times daily as needed for dry eyes.   rosuvastatin 10 MG tablet Commonly known as: CRESTOR Take 10 mg by mouth every morning.         SignedAshok Pall 06/01/2021, 8:13 PM

## 2021-06-27 ENCOUNTER — Ambulatory Visit: Payer: Medicare Other | Admitting: Pulmonary Disease

## 2022-12-03 IMAGING — CT CT CHEST-ABD-PELV W/ CM
2 of 5 series · 13 of 46 positions shown, 15 images · IV contrast (agent unspecified)
Comparison: 01/10/2016 chest CT and 06/21/2015 abdomen/pelvic CT

CLINICAL DATA: 70-year-old female with fall and chest, abdomen and
pelvic injury. Patient with chronic renal failure on dialysis.

EXAM:
CT CHEST, ABDOMEN, AND PELVIS WITH CONTRAST
TECHNIQUE: Multidetector CT imaging of the chest, abdomen and pelvis was
performed following the standard protocol during bolus
administration of intravenous contrast.

[Series 3: cap with · axial · 0.76mm/px · z∈[-843,-303]mm · 10 of 128 slices shown, 12 images]
[im 10/128  soft-tissue]
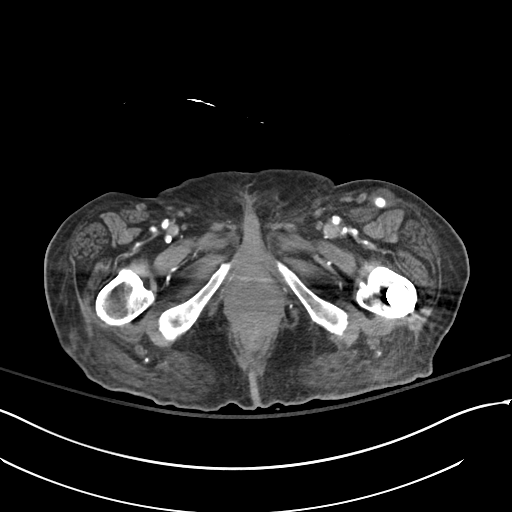
[im 10/128  bone]
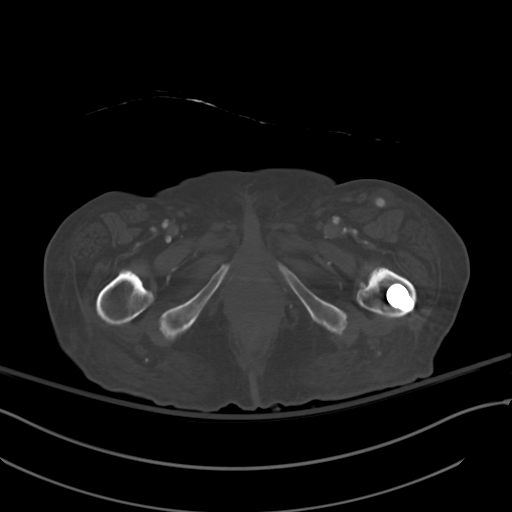
[im 20/128  soft-tissue]
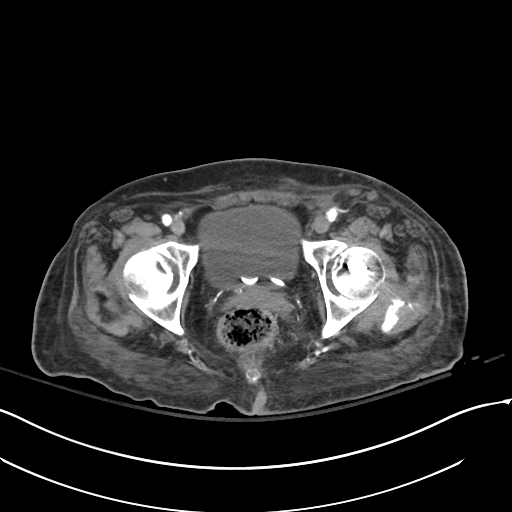
[im 40/128  soft-tissue]
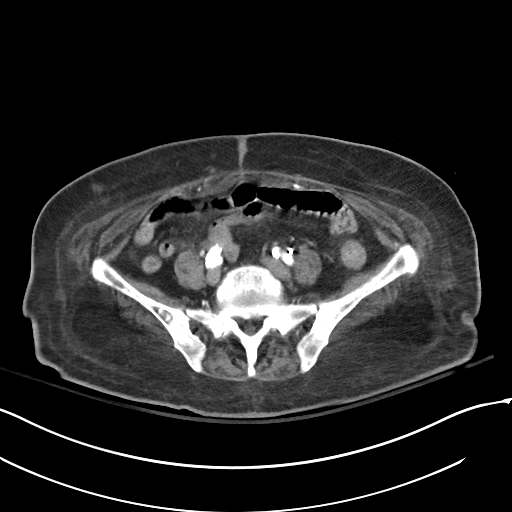
[im 49/128  soft-tissue]
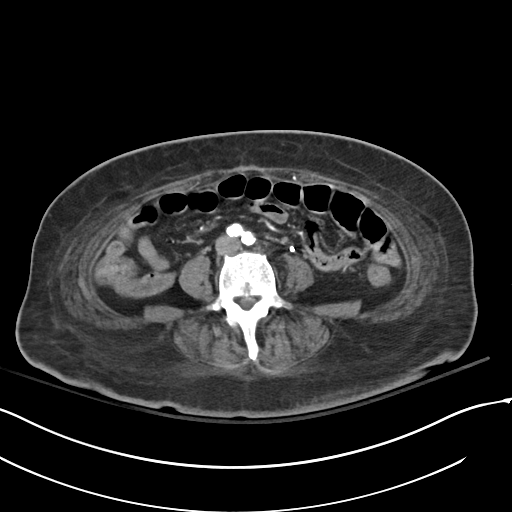
[im 59/128  soft-tissue]
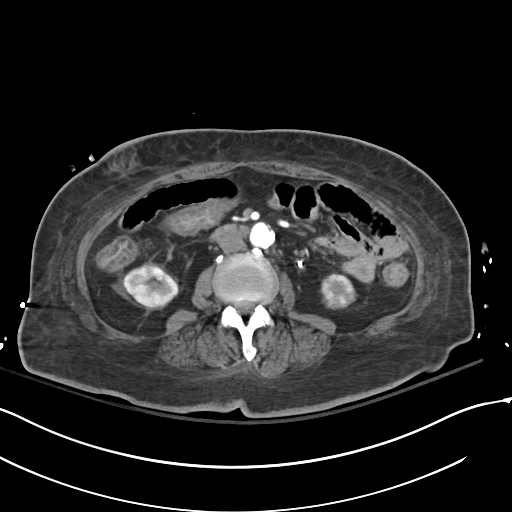
[im 69/128  soft-tissue]
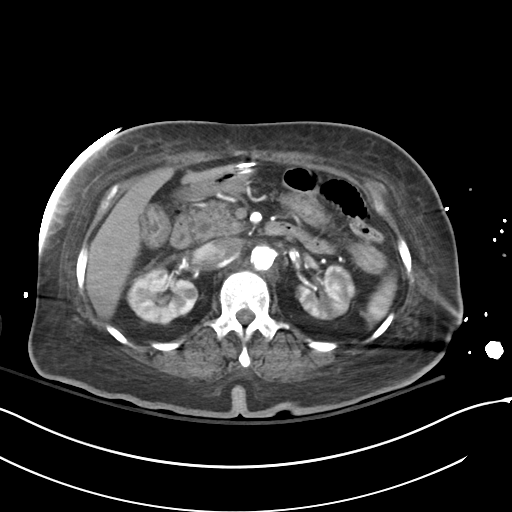
[im 79/128  soft-tissue]
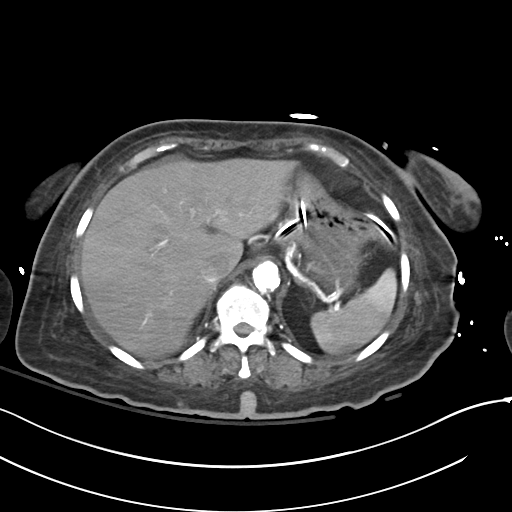
[im 98/128  soft-tissue]
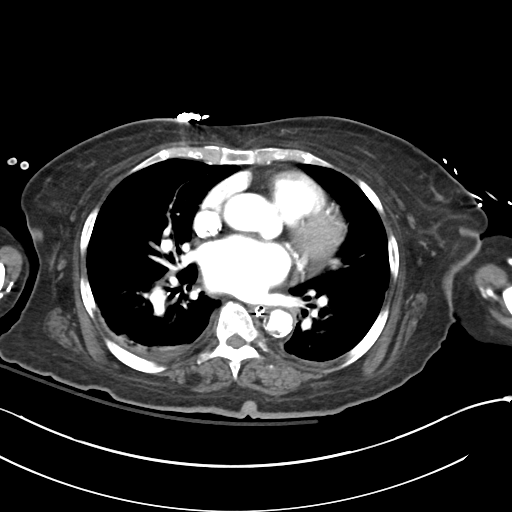
[im 108/128  soft-tissue]
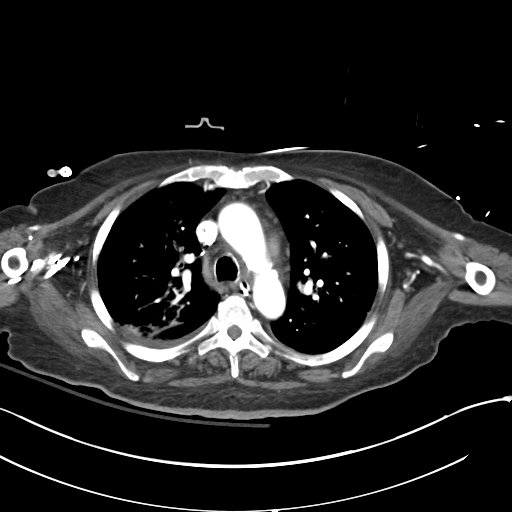
[im 108/128  bone]
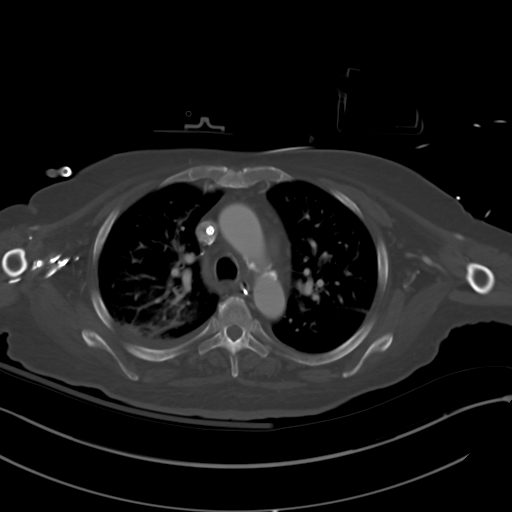
[im 118/128  soft-tissue]
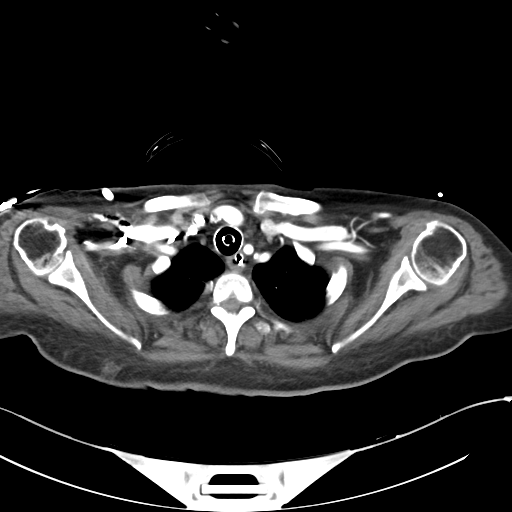

[Series 6: cor · coronal · 0.71mm/px · 3 of 84 slices shown]
[im 28/84  soft-tissue]
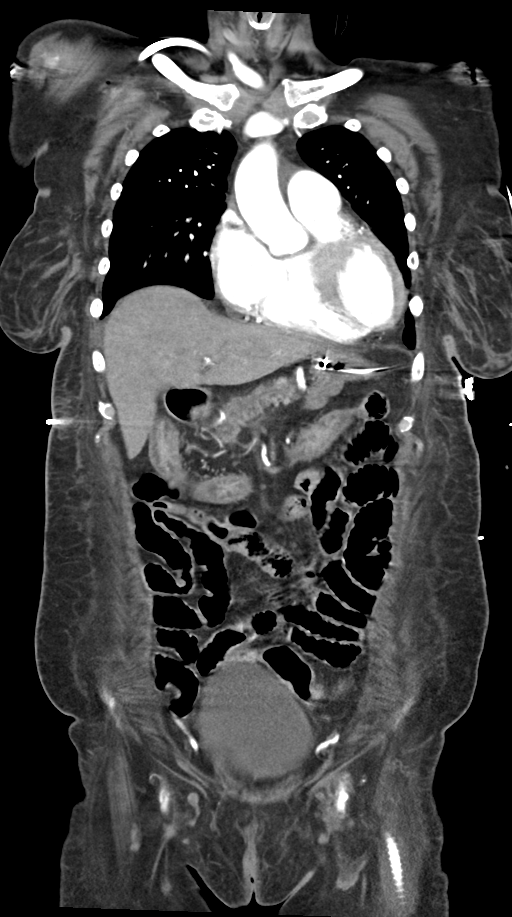
[im 37/84  soft-tissue]
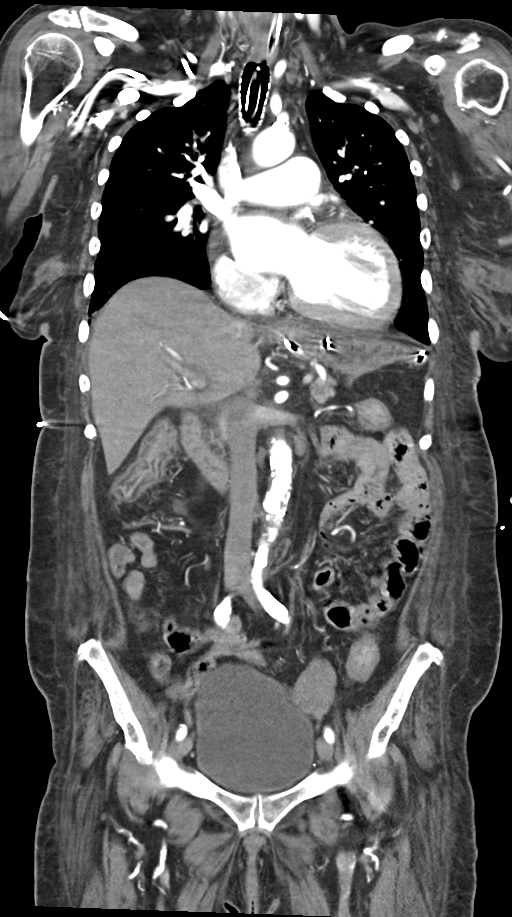
[im 47/84  soft-tissue]
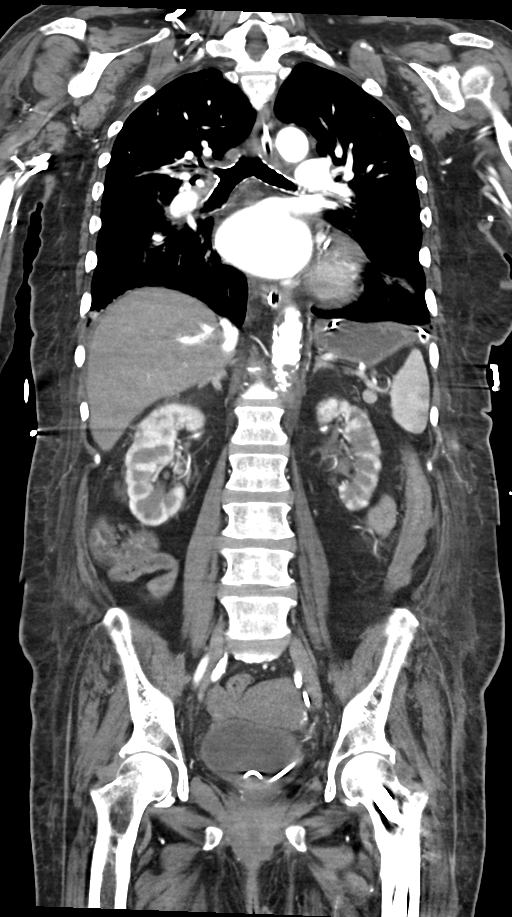

[13 of 46 positions shown; findings below may reference images not displayed]

RADIATION DOSE REDUCTION: This exam was performed according to the
departmental dose-optimization program which includes automated
exposure control, adjustment of the mA and/or kV according to
patient size and/or use of iterative reconstruction technique.

CONTRAST:  80mL OMNIPAQUE IOHEXOL 350 MG/ML SOLN
FINDINGS: CT CHEST FINDINGS

Cardiovascular: Cardiomegaly again noted. Heavy coronary artery and
thoracic aortic atherosclerotic calcification again identified.
There is no evidence of thoracic aortic aneurysm, definite
dissection or pericardial effusion.

Mediastinum/Nodes: An endotracheal tube and OG tube are present.
There is no evidence of mediastinal hematoma or mass. No abnormal
lymph nodes are identified.

Lungs/Pleura: Bibasilar atelectasis and trace RIGHT pleural effusion
noted. Patchy ground-glass and mild airspace opacities within the
RIGHT UPPER lobe and RIGHT LOWER lobe noted which may represent
aspiration or less likely contusions.

There is no evidence of pneumothorax.

No discrete mass or pulmonary nodules noted.

Musculoskeletal: No acute or suspicious bony abnormalities are
noted.

CT ABDOMEN PELVIS FINDINGS

Hepatobiliary: The liver is unremarkable. The patient is status post
cholecystectomy. There is no evidence of intrahepatic or
extrahepatic biliary dilatation.

Pancreas: A 1.2 cm cyst within the pancreatic body is noted. No
other abnormalities are identified.

Spleen: No definite abnormality. Heterogeneity on arterial phase
images is likely normal differential flow.

Adrenals/Urinary Tract: Kidneys are small and slightly heterogeneous
with cortical atrophy. A LEFT urinary stent noted with tips in the
LEFT renal pelvis and bladder. No definite solid mass or
hydronephrosis noted. The adrenal glands and bladder are otherwise
unremarkable.

Stomach/Bowel: Stomach is within normal limits except for NG tube.
No evidence of bowel wall thickening, distention, or inflammatory
changes.

Vascular/Lymphatic: Heavy abdominal aortic atherosclerotic
calcification and plaque noted without aneurysm. LEFT femoral bypass
graft is noted. No enlarged lymph nodes are identified.

Reproductive: Uterus and bilateral adnexa are unremarkable.

Other: No ascites, focal collection or pneumoperitoneum.

Musculoskeletal: No acute or suspicious bony abnormalities are
noted. Surgical hardware within proximal LEFT femur is noted.
IMPRESSION: 1. No evidence of acute injury within the abdomen or pelvis.
2. Patchy ground-glass and mild airspace opacities within the RIGHT
UPPER and RIGHT LOWER lobes which may represent aspiration or less
likely contusions. No evidence of acute fracture.
3. Bibasilar atelectasis and trace RIGHT pleural effusion.
4. Cardiomegaly and coronary artery disease.
5. LEFT urinary stent.
[DATE] cm pancreatic body cyst. Consider 1 year follow-up CT or MRI
as clinically indicated.
7. Aortic Atherosclerosis ([id number]).

## 2022-12-03 IMAGING — CT CT HEAD W/O CM
4 series · 15 of 47 positions shown, 17 images · non-contrast
Comparison: None.

CLINICAL DATA: 70-year-old female with syncope with head, facial
and neck injury and altered mental status. Patient on Eliquis.



[Series 3: head wo · axial · 0.42mm/px · z∈[-159,-39]mm · 7 of 33 slices shown, 9 images]
[im 5/33  brain]
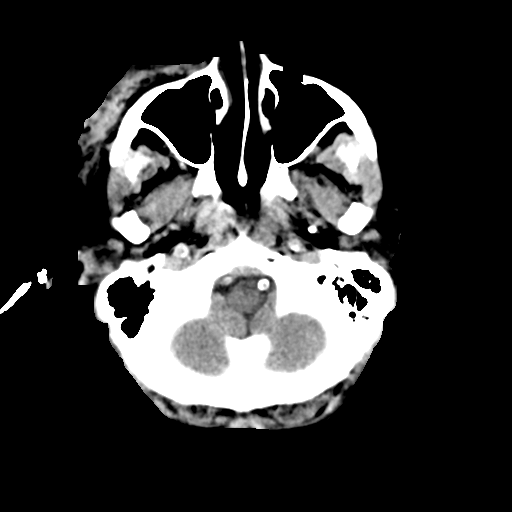
[im 5/33  bone]
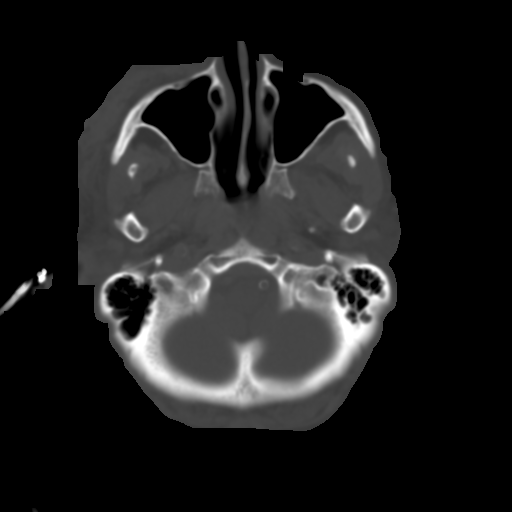
[im 9/33  brain]
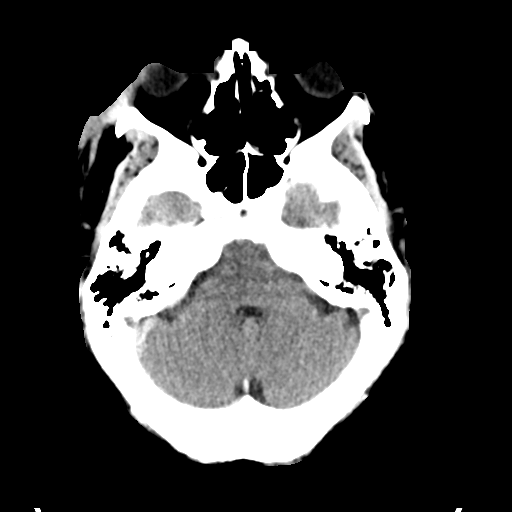
[im 13/33  brain]
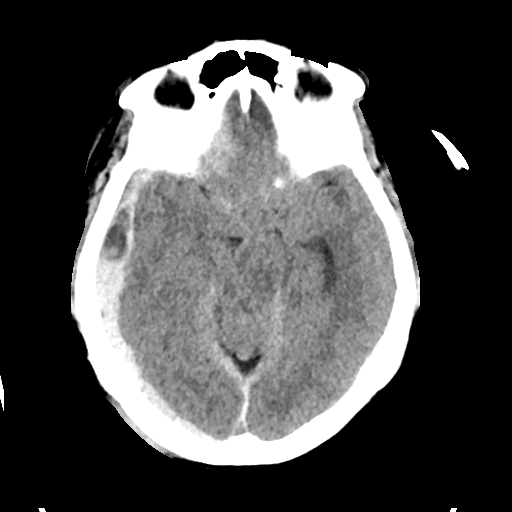
[im 17/33  brain]
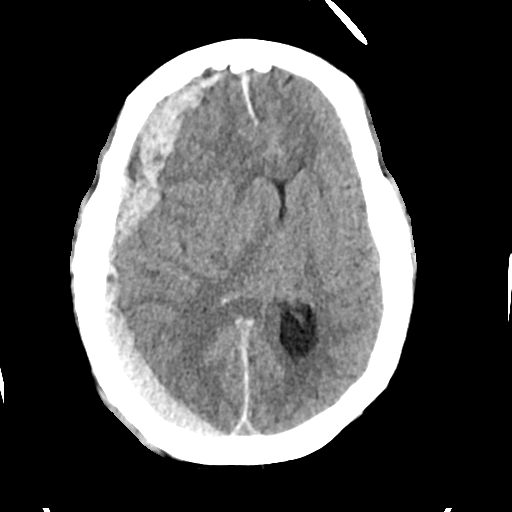
[im 21/33  brain]
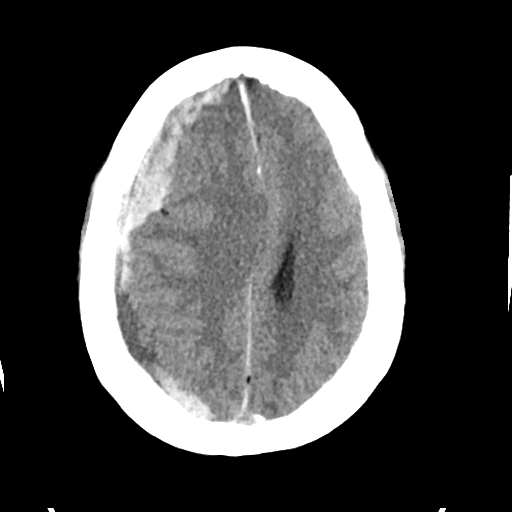
[im 21/33  bone]
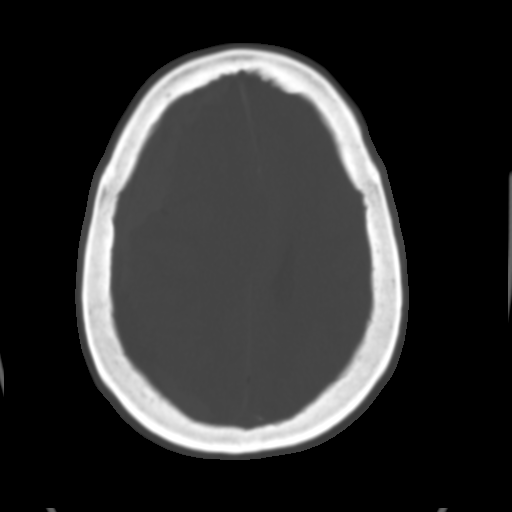
[im 25/33  brain]
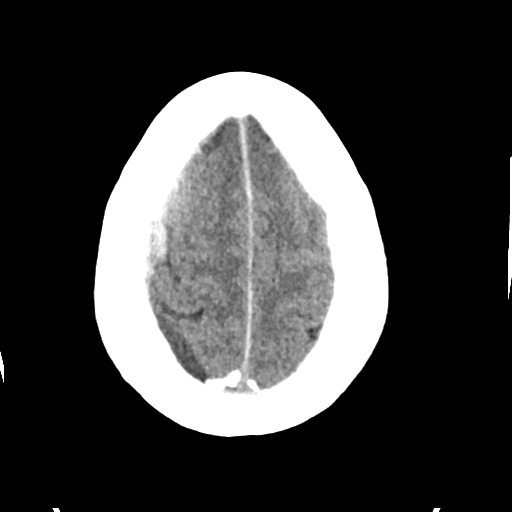
[im 29/33  brain]
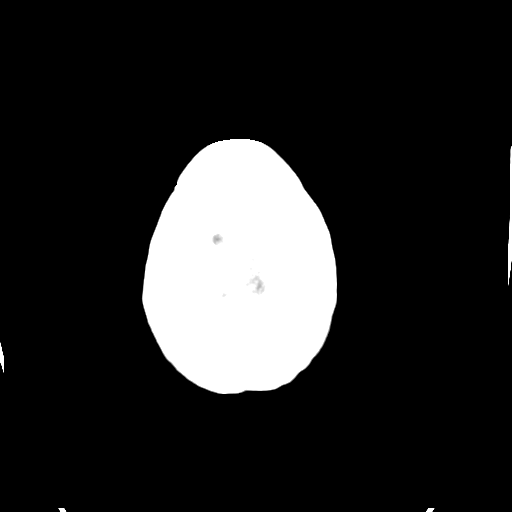

[Series 4: head bone · axial · 0.42mm/px · z∈[-163,-147]mm · 2 of 82 slices shown]
[im 9/82  bone]
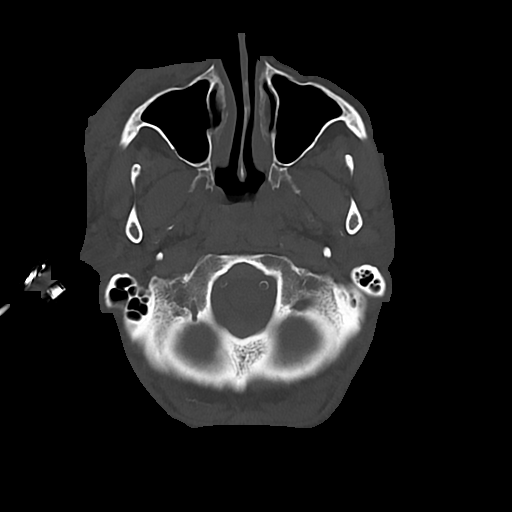
[im 17/82  bone]
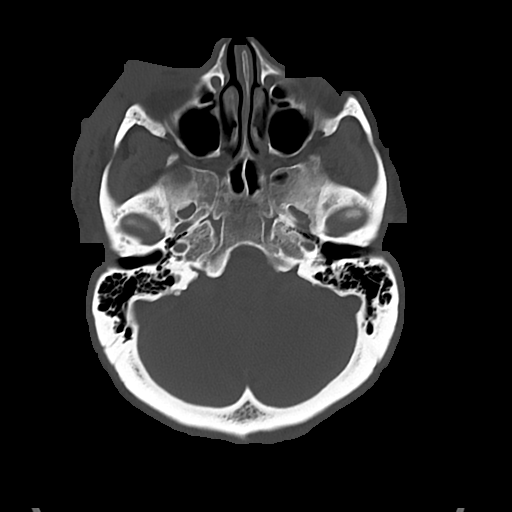

[Series 5: cor soft · coronal · 0.30mm/px · 3 of 67 slices shown]
[im 25/67  brain]
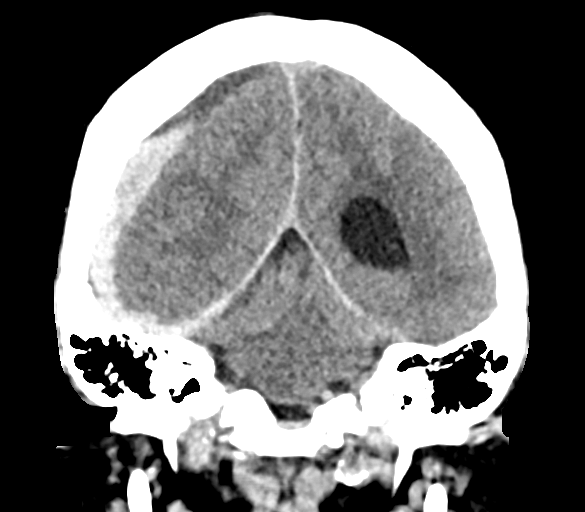
[im 31/67  brain]
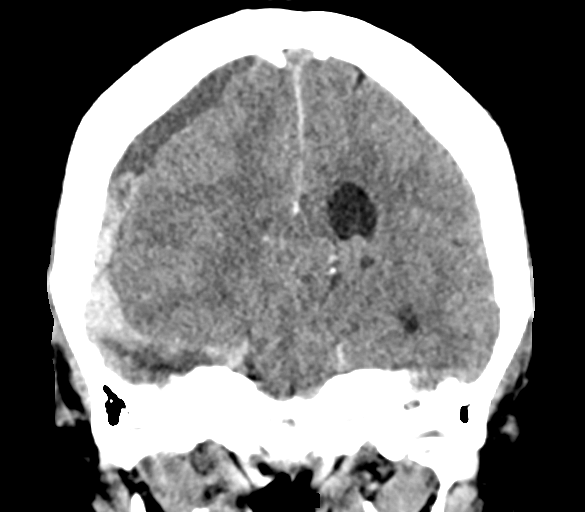
[im 36/67  brain]
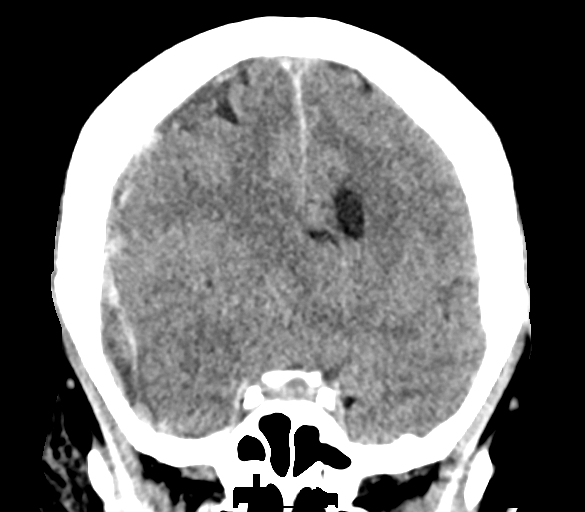

[Series 6: sag soft · sagittal · 0.30mm/px · 3 of 59 slices shown]
[im 20/59  brain]
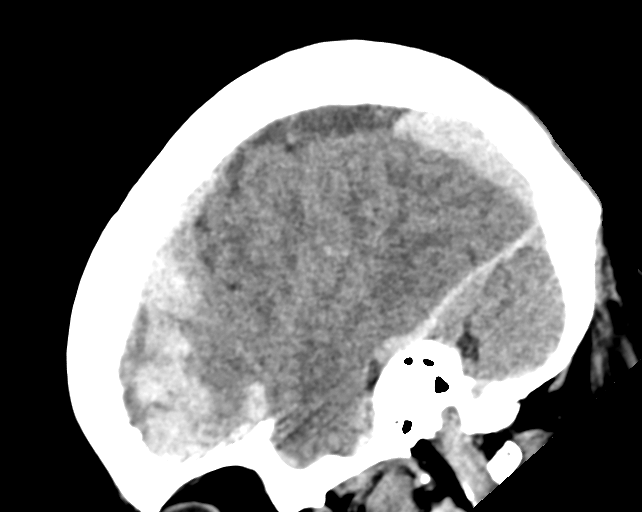
[im 30/59  brain]
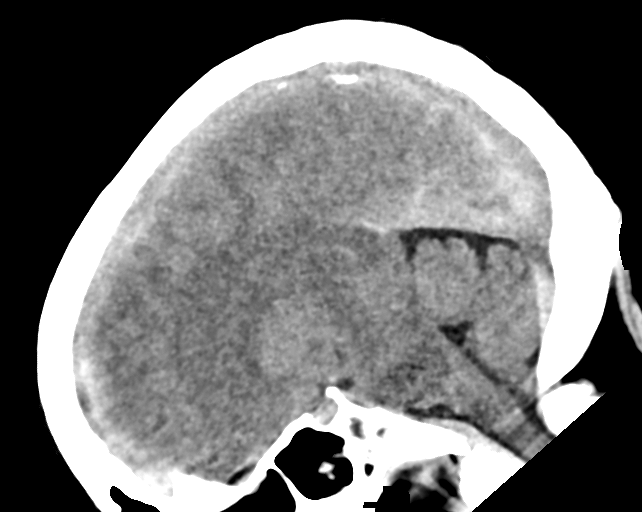
[im 39/59  brain]
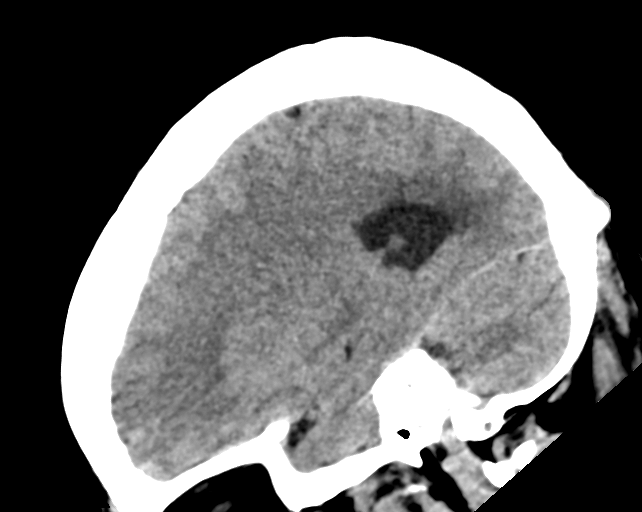

[15 of 47 positions shown; findings below may reference images not displayed]

FINDINGS: CT HEAD FINDINGS

Brain: A hyperacute/acute subdural hematoma is noted overlying the
majority of the RIGHT cerebral hemisphere and measuring up to 1.7 cm
in greatest diameter. There is 1.7 cm RIGHT to LEFT midline shift
with enlargement of the LEFT ventricle. Effacement of the basilar
cisterns is also noted.

No acute infarction is identified.

Vascular: Carotid atherosclerotic calcifications are noted.

Skull: No acute fracture identified.

Other: RIGHT facial soft tissue swelling/hemorrhage is noted.

CT MAXILLOFACIAL FINDINGS

Osseous: No fracture or mandibular dislocation. No destructive
process.

Orbits: Negative. No traumatic or inflammatory finding.

Sinuses: No acute abnormality

Soft tissues: Moderate RIGHT facial soft tissue swelling/hemorrhage
is noted.

CT CERVICAL SPINE FINDINGS

Alignment: Normal.

Skull base and vertebrae: No acute fracture. No primary bone lesion
or focal pathologic process.

Soft tissues and spinal canal: No prevertebral fluid or swelling. No
visible canal hematoma.

Disc levels:  Unremarkable

Upper chest: No acute fracture

Other: Endotracheal tube and orogastric tube identified.
IMPRESSION: 1. 1.7 cm hyperacute/acute RIGHT subdural hematoma overlying the
majority of the RIGHT cerebral hemisphere with mass effect causing
1.7 cm RIGHT to LEFT midline shift, enlargement of the LEFT
ventricle and effacement of the basilar cisterns.
2. RIGHT facial soft tissue swelling/hemorrhage without facial
fracture.
3. No static evidence of acute injury to the cervical spine.

Critical Value/emergent results were called by telephone at the time
of interpretation on 05/07/2021 at [DATE] to provider Dr. Foday,
who verbally acknowledged these results.

## 2022-12-03 IMAGING — DX DG CHEST 1V PORT
1 series · 2 of 2 positions shown · non-contrast
Comparison: None.

CLINICAL DATA: Fall

EXAM:
PORTABLE CHEST 1 VIEW

[Series 1: chest · 0.14mm/px · 2 of 2 slices shown]
[im 1/2]
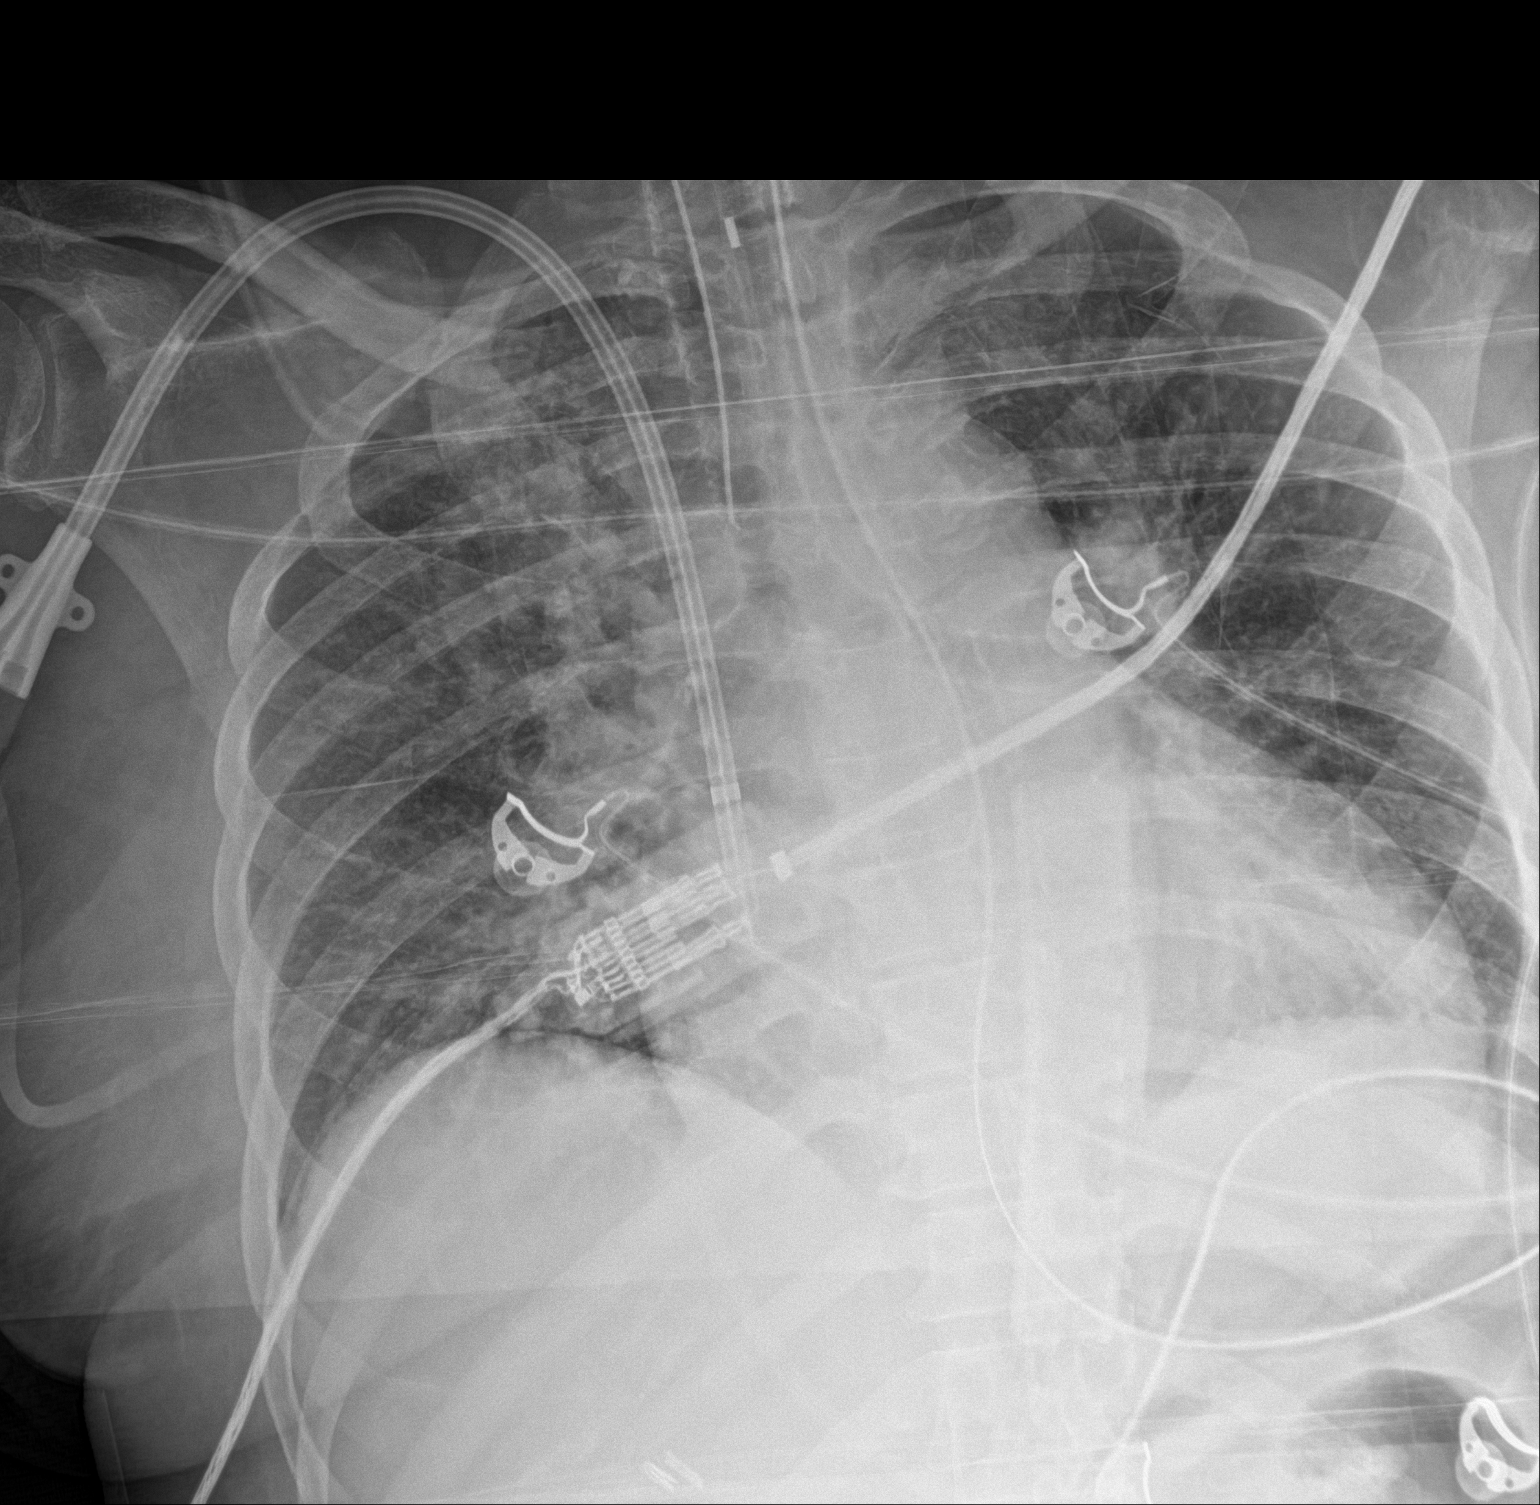
[im 2/2]
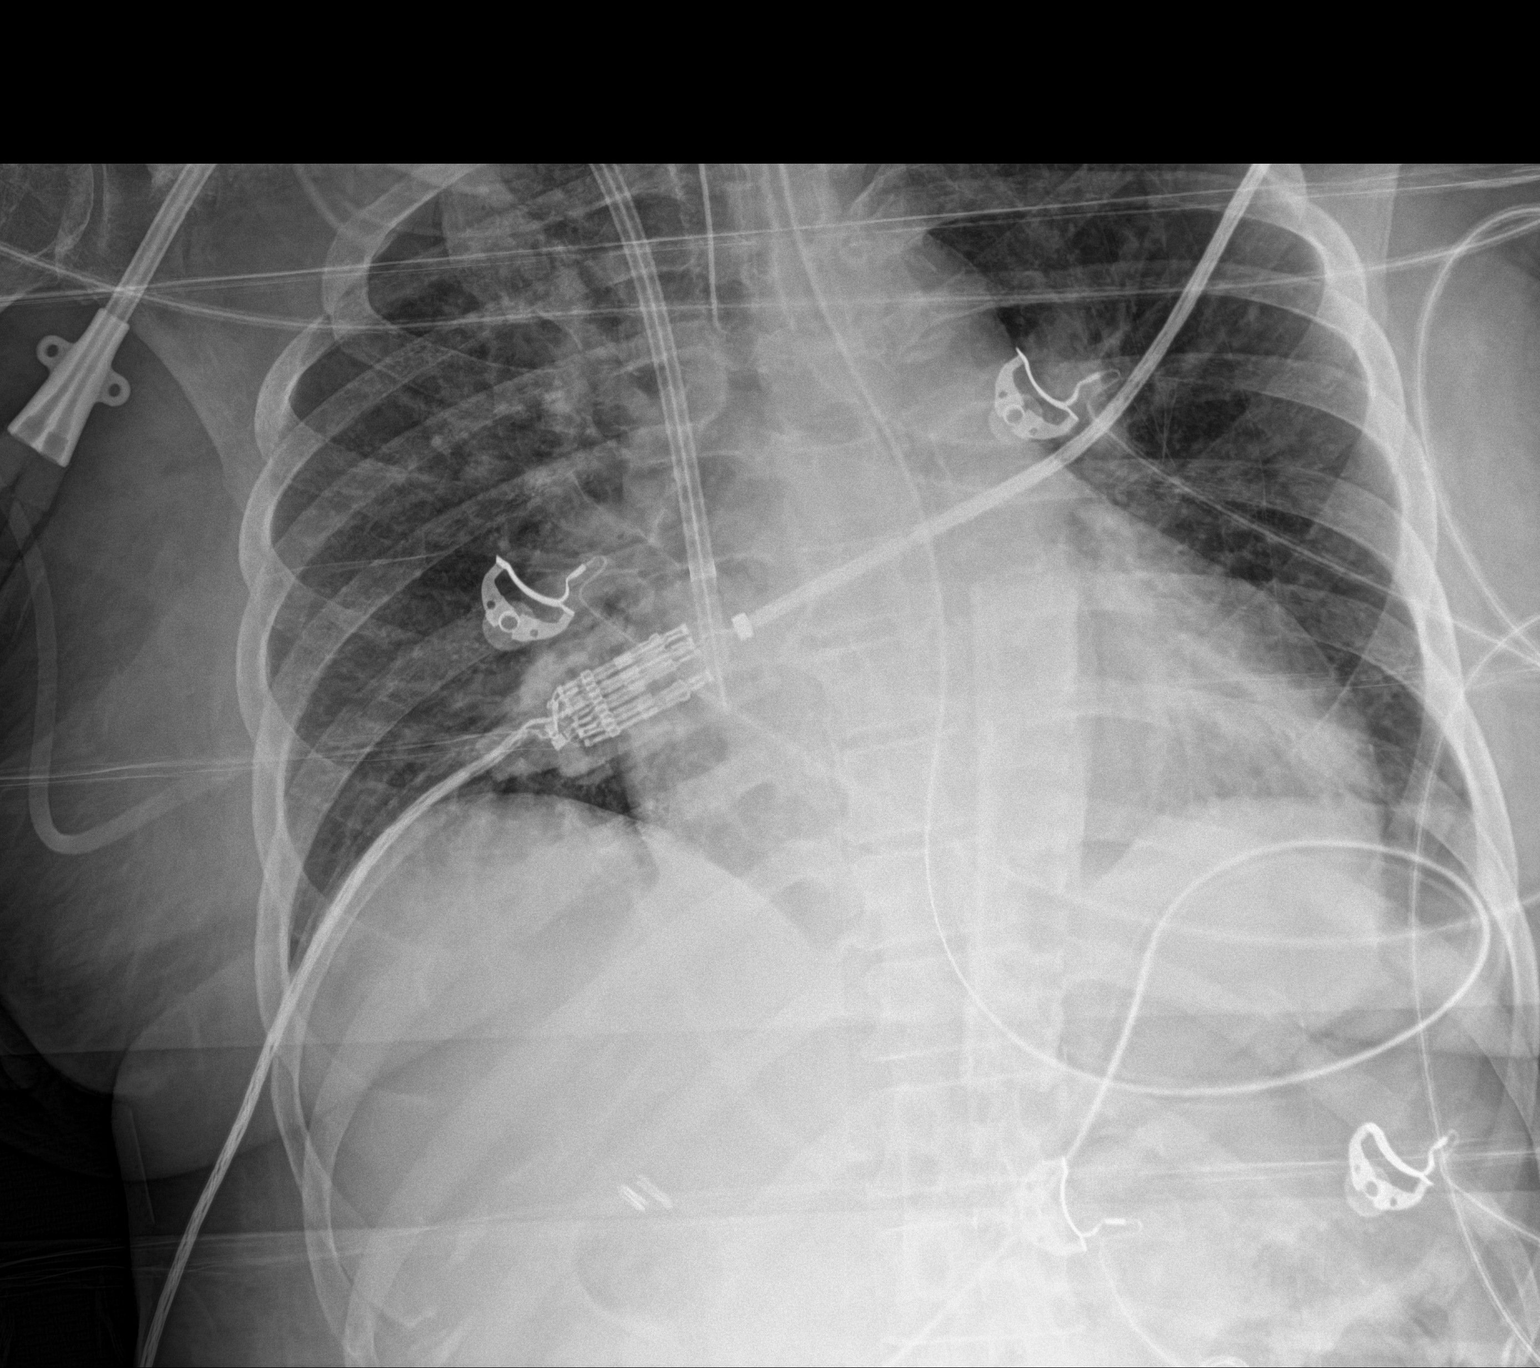

[2 of 2 positions shown; findings below may reference images not displayed]

FINDINGS: Cardiomegaly. Mild, diffuse bilateral interstitial pulmonary
opacity. Right neck large bore multi lumen vascular catheter.
Endotracheal tube with tip above the carina. Esophagogastric tube
with tip and side port below the diaphragm. No displaced rib
fracture or pneumothorax.
IMPRESSION: 1. Cardiomegaly with mild, diffuse bilateral interstitial pulmonary
opacity, most likely edema.
2. Endotracheal tube with tip above the carina.
3. Esophagogastric tube with tip and side port below the diaphragm.
